# Patient Record
Sex: Female | Born: 1955 | Race: White | Hispanic: No | Smoking: Former smoker
Health system: Southern US, Community
[De-identification: ages and names within clinical notes are randomized; demographics above are authoritative.]

## PROBLEM LIST (undated history)

## (undated) DIAGNOSIS — Z9283 Personal history of failed moderate sedation: Secondary | ICD-10-CM

## (undated) DIAGNOSIS — M549 Dorsalgia, unspecified: Secondary | ICD-10-CM

## (undated) DIAGNOSIS — I10 Essential (primary) hypertension: Secondary | ICD-10-CM

## (undated) DIAGNOSIS — K2270 Barrett's esophagus without dysplasia: Secondary | ICD-10-CM

## (undated) DIAGNOSIS — J449 Chronic obstructive pulmonary disease, unspecified: Secondary | ICD-10-CM

## (undated) DIAGNOSIS — H269 Unspecified cataract: Secondary | ICD-10-CM

## (undated) DIAGNOSIS — E039 Hypothyroidism, unspecified: Secondary | ICD-10-CM

## (undated) DIAGNOSIS — E079 Disorder of thyroid, unspecified: Secondary | ICD-10-CM

## (undated) DIAGNOSIS — K449 Diaphragmatic hernia without obstruction or gangrene: Secondary | ICD-10-CM

## (undated) DIAGNOSIS — T8859XA Other complications of anesthesia, initial encounter: Secondary | ICD-10-CM

## (undated) DIAGNOSIS — B009 Herpesviral infection, unspecified: Secondary | ICD-10-CM

## (undated) DIAGNOSIS — M1990 Unspecified osteoarthritis, unspecified site: Secondary | ICD-10-CM

## (undated) DIAGNOSIS — F419 Anxiety disorder, unspecified: Secondary | ICD-10-CM

## (undated) DIAGNOSIS — D259 Leiomyoma of uterus, unspecified: Secondary | ICD-10-CM

## (undated) DIAGNOSIS — G4730 Sleep apnea, unspecified: Secondary | ICD-10-CM

## (undated) DIAGNOSIS — K219 Gastro-esophageal reflux disease without esophagitis: Secondary | ICD-10-CM

## (undated) DIAGNOSIS — G8929 Other chronic pain: Secondary | ICD-10-CM

## (undated) DIAGNOSIS — R000 Tachycardia, unspecified: Secondary | ICD-10-CM

## (undated) DIAGNOSIS — Z5189 Encounter for other specified aftercare: Secondary | ICD-10-CM

## (undated) DIAGNOSIS — E119 Type 2 diabetes mellitus without complications: Secondary | ICD-10-CM

## (undated) DIAGNOSIS — K269 Duodenal ulcer, unspecified as acute or chronic, without hemorrhage or perforation: Secondary | ICD-10-CM

## (undated) DIAGNOSIS — E785 Hyperlipidemia, unspecified: Secondary | ICD-10-CM

## (undated) DIAGNOSIS — J439 Emphysema, unspecified: Secondary | ICD-10-CM

## (undated) DIAGNOSIS — D229 Melanocytic nevi, unspecified: Secondary | ICD-10-CM

## (undated) DIAGNOSIS — Z8673 Personal history of transient ischemic attack (TIA), and cerebral infarction without residual deficits: Secondary | ICD-10-CM

## (undated) DIAGNOSIS — Z8639 Personal history of other endocrine, nutritional and metabolic disease: Secondary | ICD-10-CM

## (undated) DIAGNOSIS — Z720 Tobacco use: Secondary | ICD-10-CM

## (undated) DIAGNOSIS — K648 Other hemorrhoids: Secondary | ICD-10-CM

## (undated) DIAGNOSIS — N939 Abnormal uterine and vaginal bleeding, unspecified: Secondary | ICD-10-CM

## (undated) DIAGNOSIS — N840 Polyp of corpus uteri: Secondary | ICD-10-CM

## (undated) HISTORY — DX: Diaphragmatic hernia without obstruction or gangrene: K44.9

## (undated) HISTORY — PX: TONSILLECTOMY: SUR1361

## (undated) HISTORY — DX: Personal history of other endocrine, nutritional and metabolic disease: Z86.39

## (undated) HISTORY — DX: Barrett's esophagus without dysplasia: K22.70

## (undated) HISTORY — DX: Hypothyroidism, unspecified: E03.9

## (undated) HISTORY — DX: Unspecified cataract: H26.9

## (undated) HISTORY — PX: WISDOM TOOTH EXTRACTION: SHX21

## (undated) HISTORY — DX: Polyp of corpus uteri: N84.0

## (undated) HISTORY — DX: Encounter for other specified aftercare: Z51.89

## (undated) HISTORY — DX: Melanocytic nevi, unspecified: D22.9

## (undated) HISTORY — DX: Personal history of transient ischemic attack (TIA), and cerebral infarction without residual deficits: Z86.73

## (undated) HISTORY — DX: Essential (primary) hypertension: I10

## (undated) HISTORY — DX: Disorder of thyroid, unspecified: E07.9

## (undated) HISTORY — DX: Chronic obstructive pulmonary disease, unspecified: J44.9

## (undated) HISTORY — DX: Gastro-esophageal reflux disease without esophagitis: K21.9

## (undated) HISTORY — DX: Hyperlipidemia, unspecified: E78.5

## (undated) HISTORY — PX: CHOLECYSTECTOMY: SHX55

## (undated) HISTORY — DX: Other hemorrhoids: K64.8

## (undated) HISTORY — DX: Unspecified osteoarthritis, unspecified site: M19.90

## (undated) HISTORY — PX: MYOMECTOMY: SHX85

## (undated) HISTORY — DX: Duodenal ulcer, unspecified as acute or chronic, without hemorrhage or perforation: K26.9

## (undated) HISTORY — DX: Tachycardia, unspecified: R00.0

## (undated) HISTORY — DX: Personal history of failed moderate sedation: Z92.83

## (undated) HISTORY — DX: Other chronic pain: G89.29

## (undated) HISTORY — DX: Leiomyoma of uterus, unspecified: D25.9

## (undated) HISTORY — PX: HYSTEROSCOPY: SHX211

## (undated) HISTORY — PX: ADENOIDECTOMY: SHX5191

## (undated) HISTORY — DX: Dorsalgia, unspecified: M54.9

## (undated) HISTORY — DX: Abnormal uterine and vaginal bleeding, unspecified: N93.9

## (undated) HISTORY — DX: Tobacco use: Z72.0

## (undated) HISTORY — DX: Emphysema, unspecified: J43.9

## (undated) HISTORY — PX: DILATION AND CURETTAGE OF UTERUS: SHX78

---

## 2000-01-24 ENCOUNTER — Emergency Department (HOSPITAL_COMMUNITY): Admission: EM | Admit: 2000-01-24 | Discharge: 2000-01-24 | Payer: Self-pay

## 2000-03-03 ENCOUNTER — Ambulatory Visit (HOSPITAL_COMMUNITY): Admission: RE | Admit: 2000-03-03 | Discharge: 2000-03-03 | Payer: Self-pay

## 2000-04-06 ENCOUNTER — Ambulatory Visit (HOSPITAL_COMMUNITY): Admission: RE | Admit: 2000-04-06 | Discharge: 2000-04-06 | Payer: Self-pay | Admitting: Family Medicine

## 2000-04-06 ENCOUNTER — Encounter: Payer: Self-pay | Admitting: Family Medicine

## 2000-10-12 ENCOUNTER — Emergency Department (HOSPITAL_COMMUNITY): Admission: EM | Admit: 2000-10-12 | Discharge: 2000-10-13 | Payer: Self-pay | Admitting: Emergency Medicine

## 2000-10-12 DIAGNOSIS — I639 Cerebral infarction, unspecified: Secondary | ICD-10-CM

## 2000-10-12 DIAGNOSIS — Z8673 Personal history of transient ischemic attack (TIA), and cerebral infarction without residual deficits: Secondary | ICD-10-CM

## 2000-10-12 HISTORY — DX: Personal history of transient ischemic attack (TIA), and cerebral infarction without residual deficits: Z86.73

## 2000-10-12 HISTORY — DX: Cerebral infarction, unspecified: I63.9

## 2000-10-13 ENCOUNTER — Encounter: Payer: Self-pay | Admitting: Emergency Medicine

## 2000-10-25 ENCOUNTER — Encounter: Payer: Self-pay | Admitting: Family Medicine

## 2000-10-25 ENCOUNTER — Ambulatory Visit (HOSPITAL_COMMUNITY): Admission: RE | Admit: 2000-10-25 | Discharge: 2000-10-25 | Payer: Self-pay | Admitting: Family Medicine

## 2001-09-21 ENCOUNTER — Ambulatory Visit (HOSPITAL_COMMUNITY): Admission: RE | Admit: 2001-09-21 | Discharge: 2001-09-21 | Payer: Self-pay | Admitting: Internal Medicine

## 2001-09-21 ENCOUNTER — Encounter: Payer: Self-pay | Admitting: Internal Medicine

## 2002-11-07 ENCOUNTER — Other Ambulatory Visit: Admission: RE | Admit: 2002-11-07 | Discharge: 2002-11-07 | Payer: Self-pay | Admitting: Obstetrics and Gynecology

## 2002-12-09 ENCOUNTER — Encounter: Payer: Self-pay | Admitting: Obstetrics and Gynecology

## 2002-12-09 ENCOUNTER — Ambulatory Visit (HOSPITAL_COMMUNITY): Admission: RE | Admit: 2002-12-09 | Discharge: 2002-12-09 | Payer: Self-pay | Admitting: Obstetrics and Gynecology

## 2003-03-15 DIAGNOSIS — N840 Polyp of corpus uteri: Secondary | ICD-10-CM

## 2003-03-15 HISTORY — DX: Polyp of corpus uteri: N84.0

## 2003-03-17 ENCOUNTER — Encounter (INDEPENDENT_AMBULATORY_CARE_PROVIDER_SITE_OTHER): Payer: Self-pay

## 2003-03-17 ENCOUNTER — Ambulatory Visit (HOSPITAL_COMMUNITY): Admission: RE | Admit: 2003-03-17 | Discharge: 2003-03-17 | Payer: Self-pay | Admitting: Obstetrics and Gynecology

## 2004-02-16 ENCOUNTER — Ambulatory Visit: Payer: Self-pay | Admitting: Internal Medicine

## 2004-02-28 ENCOUNTER — Ambulatory Visit (HOSPITAL_COMMUNITY): Admission: RE | Admit: 2004-02-28 | Discharge: 2004-02-28 | Payer: Self-pay | Admitting: *Deleted

## 2004-03-22 ENCOUNTER — Ambulatory Visit: Payer: Self-pay

## 2004-03-22 ENCOUNTER — Encounter: Payer: Self-pay | Admitting: Internal Medicine

## 2004-04-16 ENCOUNTER — Ambulatory Visit: Payer: Self-pay | Admitting: Internal Medicine

## 2005-05-20 ENCOUNTER — Ambulatory Visit: Payer: Self-pay | Admitting: Internal Medicine

## 2005-05-28 ENCOUNTER — Encounter (INDEPENDENT_AMBULATORY_CARE_PROVIDER_SITE_OTHER): Payer: Self-pay | Admitting: *Deleted

## 2005-05-28 ENCOUNTER — Ambulatory Visit: Payer: Self-pay | Admitting: Internal Medicine

## 2005-05-28 DIAGNOSIS — K22719 Barrett's esophagus with dysplasia, unspecified: Secondary | ICD-10-CM

## 2005-06-25 ENCOUNTER — Ambulatory Visit: Payer: Self-pay | Admitting: Internal Medicine

## 2006-06-25 ENCOUNTER — Encounter: Admission: RE | Admit: 2006-06-25 | Discharge: 2006-06-25 | Payer: Self-pay | Admitting: Family Medicine

## 2006-10-08 ENCOUNTER — Encounter: Payer: Self-pay | Admitting: Internal Medicine

## 2007-04-29 ENCOUNTER — Encounter: Admission: RE | Admit: 2007-04-29 | Discharge: 2007-04-29 | Payer: Self-pay | Admitting: Family Medicine

## 2009-01-10 ENCOUNTER — Encounter: Payer: Self-pay | Admitting: Internal Medicine

## 2009-01-10 ENCOUNTER — Ambulatory Visit: Payer: Self-pay | Admitting: Infectious Diseases

## 2009-01-10 DIAGNOSIS — E039 Hypothyroidism, unspecified: Secondary | ICD-10-CM | POA: Insufficient documentation

## 2009-01-10 DIAGNOSIS — K219 Gastro-esophageal reflux disease without esophagitis: Secondary | ICD-10-CM

## 2009-01-10 DIAGNOSIS — I10 Essential (primary) hypertension: Secondary | ICD-10-CM | POA: Insufficient documentation

## 2009-01-10 DIAGNOSIS — J45909 Unspecified asthma, uncomplicated: Secondary | ICD-10-CM

## 2009-01-11 LAB — CONVERTED CEMR LAB
AST: 38 units/L — ABNORMAL HIGH (ref 0–37)
Albumin: 4.2 g/dL (ref 3.5–5.2)
Basophils Absolute: 0.1 10*3/uL (ref 0.0–0.1)
Chloride: 100 meq/L (ref 96–112)
Free T4: 0.3 ng/dL — ABNORMAL LOW (ref 0.80–1.80)
HCT: 38.7 % (ref 36.0–46.0)
Hemoglobin: 11.9 g/dL — ABNORMAL LOW (ref 12.0–15.0)
Lymphocytes Relative: 30 % (ref 12–46)
Lymphs Abs: 2.3 10*3/uL (ref 0.7–4.0)
MCV: 91.5 fL (ref >=78.0)
Monocytes Absolute: 0.6 10*3/uL (ref 0.1–1.0)
Monocytes Relative: 8 % (ref 3–12)
Neutro Abs: 4.7 10*3/uL (ref 1.7–7.7)
Neutrophils Relative %: 60 % (ref 43–77)
Platelets: 321 10*3/uL (ref 150–400)
Potassium: 4 meq/L (ref 3.5–5.3)
RBC: 4.23 M/uL (ref 3.87–5.11)
Sodium: 139 meq/L (ref 135–145)
Total Bilirubin: 0.3 mg/dL (ref 0.3–1.2)
Total Protein: 7.8 g/dL (ref 6.0–8.3)
WBC: 7.9 10*3/uL (ref 4.0–10.5)

## 2009-02-06 ENCOUNTER — Ambulatory Visit: Payer: Self-pay | Admitting: Internal Medicine

## 2009-02-06 LAB — CONVERTED CEMR LAB
LDL Cholesterol: 172 mg/dL — ABNORMAL HIGH (ref 0–99)
Total CHOL/HDL Ratio: 6.2
VLDL: 27 mg/dL (ref 0–40)

## 2009-02-12 DIAGNOSIS — E785 Hyperlipidemia, unspecified: Secondary | ICD-10-CM | POA: Insufficient documentation

## 2009-02-12 DIAGNOSIS — E1169 Type 2 diabetes mellitus with other specified complication: Secondary | ICD-10-CM | POA: Insufficient documentation

## 2009-03-26 ENCOUNTER — Encounter: Payer: Self-pay | Admitting: Internal Medicine

## 2009-03-26 ENCOUNTER — Ambulatory Visit (HOSPITAL_COMMUNITY): Admission: RE | Admit: 2009-03-26 | Discharge: 2009-03-26 | Payer: Self-pay | Admitting: Internal Medicine

## 2009-03-26 ENCOUNTER — Ambulatory Visit: Payer: Self-pay | Admitting: Internal Medicine

## 2009-03-26 DIAGNOSIS — R000 Tachycardia, unspecified: Secondary | ICD-10-CM | POA: Insufficient documentation

## 2009-03-27 ENCOUNTER — Encounter: Payer: Self-pay | Admitting: Internal Medicine

## 2009-03-28 ENCOUNTER — Encounter: Payer: Self-pay | Admitting: Internal Medicine

## 2009-04-16 ENCOUNTER — Ambulatory Visit (HOSPITAL_COMMUNITY): Admission: RE | Admit: 2009-04-16 | Discharge: 2009-04-16 | Payer: Self-pay | Admitting: Internal Medicine

## 2009-05-03 ENCOUNTER — Ambulatory Visit: Payer: Self-pay | Admitting: Internal Medicine

## 2009-06-19 ENCOUNTER — Ambulatory Visit: Payer: Self-pay | Admitting: Internal Medicine

## 2009-06-26 ENCOUNTER — Ambulatory Visit: Payer: Self-pay | Admitting: Infectious Diseases

## 2009-06-26 ENCOUNTER — Telehealth: Payer: Self-pay | Admitting: Internal Medicine

## 2009-06-27 ENCOUNTER — Telehealth: Payer: Self-pay | Admitting: Internal Medicine

## 2009-07-09 LAB — CONVERTED CEMR LAB
AST: 21 units/L (ref 0–37)
BUN: 16 mg/dL (ref 6–23)
Calcium: 9.5 mg/dL (ref 8.4–10.5)
Creatinine, Ser: 0.82 mg/dL (ref 0.40–1.20)
Glucose, Bld: 94 mg/dL (ref 70–99)
Sodium: 139 meq/L (ref 135–145)
TSH: 82.659 microintl units/mL — ABNORMAL HIGH (ref 0.350–4.5)
Total Bilirubin: 0.3 mg/dL (ref 0.3–1.2)
Total Protein: 7.3 g/dL (ref 6.0–8.3)

## 2009-08-13 ENCOUNTER — Ambulatory Visit: Payer: Self-pay | Admitting: Internal Medicine

## 2009-08-13 DIAGNOSIS — D239 Other benign neoplasm of skin, unspecified: Secondary | ICD-10-CM | POA: Insufficient documentation

## 2009-10-09 ENCOUNTER — Telehealth: Payer: Self-pay | Admitting: Internal Medicine

## 2009-10-19 ENCOUNTER — Encounter (INDEPENDENT_AMBULATORY_CARE_PROVIDER_SITE_OTHER): Payer: Self-pay | Admitting: *Deleted

## 2009-10-31 ENCOUNTER — Telehealth: Payer: Self-pay | Admitting: Internal Medicine

## 2009-11-09 ENCOUNTER — Encounter: Payer: Self-pay | Admitting: Internal Medicine

## 2009-12-11 ENCOUNTER — Telehealth: Payer: Self-pay | Admitting: Internal Medicine

## 2009-12-11 ENCOUNTER — Encounter (INDEPENDENT_AMBULATORY_CARE_PROVIDER_SITE_OTHER): Payer: Self-pay | Admitting: *Deleted

## 2009-12-11 ENCOUNTER — Ambulatory Visit: Payer: Self-pay | Admitting: Internal Medicine

## 2009-12-11 DIAGNOSIS — K625 Hemorrhage of anus and rectum: Secondary | ICD-10-CM

## 2009-12-12 ENCOUNTER — Encounter: Payer: Self-pay | Admitting: Internal Medicine

## 2009-12-18 ENCOUNTER — Ambulatory Visit: Payer: Self-pay | Admitting: Internal Medicine

## 2009-12-18 ENCOUNTER — Ambulatory Visit (HOSPITAL_COMMUNITY): Admission: RE | Admit: 2009-12-18 | Discharge: 2009-12-18 | Payer: Self-pay | Admitting: Internal Medicine

## 2009-12-18 HISTORY — PX: UPPER GASTROINTESTINAL ENDOSCOPY: SHX188

## 2010-01-25 ENCOUNTER — Ambulatory Visit: Payer: Self-pay | Admitting: Internal Medicine

## 2010-01-25 LAB — CONVERTED CEMR LAB
Basophils Absolute: 0 10*3/uL (ref 0.0–0.1)
Basophils Relative: 0.4 % (ref 0.0–3.0)
Eosinophils Absolute: 0.2 10*3/uL (ref 0.0–0.7)
Eosinophils Relative: 2.6 % (ref 0.0–5.0)
Lymphocytes Relative: 30.7 % (ref 12.0–46.0)
MCHC: 33.4 g/dL (ref 30.0–36.0)
Monocytes Absolute: 0.6 10*3/uL (ref 0.1–1.0)
Monocytes Relative: 7.4 % (ref 3.0–12.0)
Neutro Abs: 4.7 10*3/uL (ref 1.4–7.7)
Platelets: 293 10*3/uL (ref 150.0–400.0)

## 2010-01-28 ENCOUNTER — Telehealth: Payer: Self-pay | Admitting: Internal Medicine

## 2010-01-29 ENCOUNTER — Telehealth: Payer: Self-pay | Admitting: *Deleted

## 2010-01-29 ENCOUNTER — Ambulatory Visit: Payer: Self-pay | Admitting: Internal Medicine

## 2010-01-29 ENCOUNTER — Encounter (INDEPENDENT_AMBULATORY_CARE_PROVIDER_SITE_OTHER): Payer: Self-pay | Admitting: *Deleted

## 2010-01-29 DIAGNOSIS — G43009 Migraine without aura, not intractable, without status migrainosus: Secondary | ICD-10-CM

## 2010-01-29 LAB — CONVERTED CEMR LAB
Free T4: 1.43 ng/dL (ref 0.80–1.80)
TSH: 4.122 microintl units/mL (ref 0.350–4.5)
Total CHOL/HDL Ratio: 3.5
Triglycerides: 122 mg/dL (ref <150)
VLDL: 24 mg/dL (ref 0–40)

## 2010-02-07 ENCOUNTER — Encounter: Payer: Self-pay | Admitting: Internal Medicine

## 2010-02-07 HISTORY — PX: COLONOSCOPY: SHX174

## 2010-03-26 ENCOUNTER — Telehealth: Payer: Self-pay | Admitting: Internal Medicine

## 2010-04-19 ENCOUNTER — Telehealth: Payer: Self-pay | Admitting: *Deleted

## 2010-04-19 ENCOUNTER — Telehealth: Payer: Self-pay | Admitting: Internal Medicine

## 2010-04-20 ENCOUNTER — Emergency Department (HOSPITAL_COMMUNITY)
Admission: EM | Admit: 2010-04-20 | Discharge: 2010-04-20 | Payer: Self-pay | Source: Home / Self Care | Admitting: Family Medicine

## 2010-05-01 ENCOUNTER — Telehealth: Payer: Self-pay | Admitting: Internal Medicine

## 2010-05-02 ENCOUNTER — Ambulatory Visit: Admit: 2010-05-02 | Payer: Self-pay | Admitting: Internal Medicine

## 2010-05-14 NOTE — Consult Note (Signed)
Summary: [facility]  [facility]   Imported By: Louretta Parma 11/20/2009 15:12:59  _____________________________________________________________________  External Attachment:    Type:   Image     Comment:   External Document

## 2010-05-14 NOTE — Procedures (Signed)
Summary: Instruction for procedure/[place]  Instruction for procedure/[facility]   Imported By: Sherian Rein 12/14/2009 15:10:35  _____________________________________________________________________  External Attachment:    Type:   Image     Comment:   External Document

## 2010-05-14 NOTE — Letter (Signed)
Summary: EGD Instructions  [facility]  [address] [place], [place] [address]   Phone: [phone]  Fax: [phone]       Grace [name]    Aug 13, 1955    MRN: [id number]       Procedure Day Dorna Bloom: Jake Shark, 12/18/09     Arrival Time: 9:00 AM     Procedure Time: 10:00 AM     Location of Procedure:                    _X_ [facility] ( Outpatient Registration)   PREPARATION FOR ENDOSCOPY   On TUESDAY, 12/18/09, THE DAY OF THE PROCEDURE:  1.   No solid foods, milk or milk products are allowed after midnight the night before your procedure.  2.   Do not drink anything colored red or purple.  Avoid juices with pulp.  No orange juice.  3.  You may drink clear liquids until 6:00 AM, which is 4 hours before your procedure.                                                                                                CLEAR LIQUIDS INCLUDE: Water Jello Ice Popsicles Tea (sugar ok, no milk/cream) Powdered fruit flavored drinks Coffee (sugar ok, no milk/cream) Gatorade Juice: apple, white grape, white cranberry  Lemonade Clear bullion, consomm, broth Carbonated beverages (any kind) Strained chicken noodle soup Hard Candy   MEDICATION INSTRUCTIONS  Unless otherwise instructed, you should take regular prescription medications with a small sip of water as early as possible the morning of your procedure.                OTHER INSTRUCTIONS  You will need a responsible adult at least 55 years of age to accompany you and drive you home.   This person must remain in the waiting room during your procedure.  Wear loose fitting clothing that is easily removed.  Leave jewelry and other valuables at home.  However, you may wish to bring a book to read or an iPod/MP3 player to listen to music as you wait for your procedure to start.  Remove all body piercing jewelry and leave at home.  Total time from sign-in until discharge is approximately 2-3  hours.  You should go home directly after your procedure and rest.  You can resume normal activities the day after your procedure.  The day of your procedure you should not:   Drive   Make legal decisions   Operate machinery   Drink alcohol   Return to work  You will receive specific instructions about eating, activities and medications before you leave.    The above instructions have been reviewed and explained to me by   _______________________    I fully understand and can verbalize these instructions _____________________________ Date _________

## 2010-05-14 NOTE — Progress Notes (Signed)
Summary: Refill/gh  Phone Note Refill Request Message from:  Fax from Pharmacy on October 09, 2009 12:09 PM  Refills Requested: Medication #1:  OMEPRAZOLE 40 MG CPDR Take 1 tablet twice a day.   Last Refilled: 06/14/2009  Method Requested: Electronic Initial call taken by: Angelina Ok RN,  October 09, 2009 12:10 PM  Follow-up for Phone Call       Follow-up by: Blondell Reveal MD,  October 10, 2009 1:12 PM    Prescriptions: OMEPRAZOLE 40 MG CPDR (OMEPRAZOLE) Take 1 tablet twice a day.  #60 x 11   Entered and Authorized by:   Blondell Reveal MD   Signed by:   Blondell Reveal MD on 10/10/2009   Method used:   Faxed to ...       [facility] Department (retail)       [address] [place], [place]  [address]       Ph: [phone]       Fax: [phone]   RxID:   [id number]

## 2010-05-14 NOTE — Assessment & Plan Note (Signed)
Summary: f/u lipids/pcp-Wesleigh Markovic/hla   Vital Signs:  Patient profile:   55 year old female Height:      63.9 inches (162.31 cm) Weight:      197.0 pounds (90.09 kg) BMI:     34.25 Temp:     97.0 degrees F (36.11 degrees C) oral Pulse rate:   93 / minute BP sitting:   149 / 97  (right arm) Cuff size:   regular  Vitals Entered By: Theotis Barrio NT II (Aug 13, 2009 9:20 AM) CC: ? HERE FOR LABS/   REQUEST REMOVAL OF MOLE LEFT BREAST /  CHRONIC BACK PAIN Is Patient Diabetic? No Pain Assessment Patient in pain? yes     Location: back Intensity:       7 Type: ACHE Onset of pain  Chronic Nutritional Status BMI of > 30 = obese  Have you ever been in a relationship where you felt threatened, hurt or afraid?No   Does patient need assistance? Functional Status Self care Ambulation Normal Comments CHRONIC BACK PAIN / ? HERE FOR FOR LABS   /  REQUEST REMOVAL OF MOLE ON LEFT BREAST.   Primary Care Provider:  Blondell Reveal MD  CC:  ? HERE FOR LABS/   REQUEST REMOVAL OF MOLE LEFT BREAST /  CHRONIC BACK PAIN.  History of Present Illness: 55 year old with PMH as mentioned in the EMR comes to the office for the following.  1. Patient reports that her mole on the left breast, which has been present for the entire life, is changing in size. Patient denies any changes in the color, pain, or other systemic symptoms. Patient is requesting to see a dermatologist.  2. Reports compliance to all her medicines including her synthroid. 3. Discussed all her labs from march. Patients LDL was in 150's and was started on pravastatin and denies any problems with it.  She denies any other complaints.  Preventive Screening-Counseling & Management  Alcohol-Tobacco     Alcohol drinks/day: rarely     Smoking Status: current     Smoking Cessation Counseling: yes     Packs/Day: 3-4 cigs/day  Caffeine-Diet-Exercise     Does Patient Exercise: no  Problems Prior to Update: 1)  Epigastric Pain   (ICD-789.06) 2)  Unspecified Tachycardia  (ICD-785.0) 3)  Hyperlipidemia  (ICD-272.4) 4)  COPD  (ICD-496) 5)  Tobacco Abuse  (ICD-305.1) 6)  Essential Hypertension  (ICD-401.9) 7)  Hx of Transient Ischemic Attack  (ICD-435.9) 8)  Asthma  (ICD-493.90) 9)  Gerd  (ICD-530.81) 10)  Hypothyroidism  (ICD-244.9) 11)  Hiatal Hernia  (ICD-553.3) 12)  Reflux Esophagitis  (ICD-530.11) 13)  Barretts Esophagus  (ICD-530.85)  Medications Prior to Update: 1)  Synthroid 150 Mcg Tabs (Levothyroxine Sodium) .... Take 1 Tablet By Mouth Daily. 2)  Lisinopril-Hydrochlorothiazide 10-12.5 Mg Tabs (Lisinopril-Hydrochlorothiazide) .... Take 1 Tablet By Mouth Once A Day 3)  Combivent 103-18 Mcg/act Aero (Ipratropium-Albuterol) .... 2 Puffs Three Times Daily 4)  Omeprazole 40 Mg Cpdr (Omeprazole) .... Take 1 Tablet Twice A Day. 5)  Aspir-Low 81 Mg Tbec (Aspirin) .... Take 1 Tablet By Mouth Once A Day 6)  Pravastatin Sodium 40 Mg Tabs (Pravastatin Sodium) .... Take 1 Tablet By Mouth Once A Day  Current Medications (verified): 1)  Synthroid 150 Mcg Tabs (Levothyroxine Sodium) .... Take 1 Tablet By Mouth Daily. 2)  Lisinopril-Hydrochlorothiazide 10-12.5 Mg Tabs (Lisinopril-Hydrochlorothiazide) .... Take 1 Tablet By Mouth Once A Day 3)  Combivent 103-18 Mcg/act Aero (Ipratropium-Albuterol) .... 2 Puffs Three Times  Daily 4)  Omeprazole 40 Mg Cpdr (Omeprazole) .... Take 1 Tablet Twice A Day. 5)  Aspir-Low 81 Mg Tbec (Aspirin) .... Take 1 Tablet By Mouth Once A Day 6)  Pravastatin Sodium 40 Mg Tabs (Pravastatin Sodium) .... Take 1 Tablet By Mouth Once A Day  Allergies (verified): No Known Drug Allergies  Past History:  Family History: Last updated: 01/10/2009 Mom - 14 yr old, COPD very active Dad - Died at 52 yr old, COPD, 5 heart attacks, first attack in 15's. 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 married couple of times, divorced, no kids uses her mom social security smokes half a pack a day , cut down from 1ppd drinks alcohol occasionally  Risk Factors: Alcohol Use: rarely (08/13/2009) Exercise: no (08/13/2009)  Risk Factors: Smoking Status: current (08/13/2009) Packs/Day: 3-4 cigs/day (08/13/2009)  Past Medical History: Reviewed history from 01/10/2009 and no changes required. Hypothyroidism HTN Asthma COPD History of TIA in 2002 GERD TObacco abuse HIATAL HERNIA (ICD-553.3) REFLUX ESOPHAGITIS (ICD-530.11) BARRETTS ESOPHAGUS (ICD-530.85)  Past Surgical History: Reviewed history from 01/20/2008 and no changes required. Gallbladder surgery 1996  Family History: Reviewed history from 01/10/2009 and no changes required. Mom - 15 yr old, COPD very active Dad - Died at 45 yr old, COPD, 5 heart attacks, first attack in 31's. Only daughter  Social History: Reviewed history from 01/10/2009 and no changes required. 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 married couple of times, divorced, no kids uses her mom social security smokes half a pack a day , cut down from 1ppd drinks alcohol occasionally  Review of Systems      See HPI  Physical Exam  General:  alert and well-developed.   Head:  normocephalic and atraumatic.   Mouth:  Oral mucosa and oropharynx without lesions or exudates.  Teeth in good repair. Neck:  supple, no thyromegaly Lungs:  normal respiratory effort and normal breath sounds.   Heart:  normal rate and regular rhythm.   Abdomen:  soft and non-tender.   Msk:  No deformity or scoliosis noted of thoracic or lumbar spine.   Pulses:  R radial normal.   Extremities:  no cyanosis, clubbing or edema  Neurologic:  alert & oriented X3, strength normal in all extremities, and gait normal.   Skin:  small 0.5 cm diameter mold noted on the lateral aspect of her left breast. Pale in color, non-tender,  with no excoriations, or blood vessels. No pigmentations seen over the mole. Cervical Nodes:  No lymphadenopathy noted Psych:  Oriented X3 and memory intact for recent and remote.     Impression & Recommendations:  Problem # 1:  HYPERLIPIDEMIA (ICD-272.4)  Recently started on statins. Reports compliancy and denies any problems with statins.  Her updated medication list for this problem includes:    Pravastatin Sodium 40 Mg Tabs (Pravastatin sodium) .Marland Kitchen... Take 1 tablet by mouth once a day  Orders: T-Hgb A1C (in-house) (16109UE)  Problem # 2:  TOBACCO ABUSE (ICD-305.1)  Reports cutting down on smoking. Encouraged her to stop completely.  Orders: T-Hgb A1C (in-house) ([id number])  Problem # 3:  ESSENTIAL HYPERTENSION (ICD-401.9)  BP slightly elevated but patient reports her home BP usually run in 120'srange. Will not make any changes in her medicines.  Her updated medication list for this problem includes:    Lisinopril-hydrochlorothiazide 10-12.5  Mg Tabs (Lisinopril-hydrochlorothiazide) .Marland Kitchen... Take 1 tablet by mouth once a day  BP today: 149/97 Prior BP: 131/82 (06/19/2009)  Labs Reviewed: K+: 4.5 (06/26/2009) Creat: : 0.82 (06/26/2009)   Chol: 234 (06/26/2009)   HDL: 47 (06/26/2009)   LDL: 158 (06/26/2009)   TG: 147 (06/26/2009)  Orders: T-Hgb A1C (in-house) ([id number])  Problem # 4:  NEVUS (ICD-216.9)  Mole on her left lateral aspect of left breast. Patient reports recent increase in size although she denies any changes in color. She denies any pain, itching. Given her concerns, will refer to a dermatologist.   Orders: Dermatology Referral (Derma)  Problem # 5:  HYPOTHYROIDISM (ICD-244.9)  TSH in march was very high. Will increase her synthroid to 175 micrograms and check a repeat TSH in 2 months.  The following medications were removed from the medication list:    Synthroid 150 Mcg Tabs (Levothyroxine sodium) .Marland Kitchen... Take 1 tablet by mouth daily. Her updated medication  list for this problem includes:    Synthroid 175 Mcg Tabs (Levothyroxine sodium) .Marland Kitchen... Take 1 tablet by mouth once a day  Labs Reviewed: TSH: 82.659 (06/26/2009)    Chol: 234 (06/26/2009)   HDL: 47 (06/26/2009)   LDL: 158 (06/26/2009)   TG: 147 (06/26/2009)  Orders: T-Hgb A1C (in-house) ([id number])  Complete Medication List: 1)  Lisinopril-hydrochlorothiazide 10-12.5 Mg Tabs (Lisinopril-hydrochlorothiazide) .... Take 1 tablet by mouth once a day 2)  Combivent 103-18 Mcg/act Aero (Ipratropium-albuterol) .... 2 puffs three times daily 3)  Omeprazole 40 Mg Cpdr (Omeprazole) .... Take 1 tablet twice a day. 4)  Aspir-low 81 Mg Tbec (Aspirin) .... Take 1 tablet by mouth once a day 5)  Pravastatin Sodium 40 Mg Tabs (Pravastatin sodium) .... Take 1 tablet by mouth once a day 6)  Synthroid 175 Mcg Tabs (Levothyroxine sodium) .... Take 1 tablet by mouth once a day  Patient Instructions: 1)  Please schedule a follow-up appointment in 2 months. 2)  Take the synthroid 175 micrograms once daily. 3)  Take all the medicines as advised below. 4)  Tobacco is very bad for your health and your loved ones! You Should stop smoking!. 5)  Stop Smoking Tips: Choose a Quit date. Cut down before the Quit date. decide what you will do as a substitute when you feel the urge to smoke(gum,toothpick,exercise). Prescriptions: SYNTHROID 175 MCG TABS (LEVOTHYROXINE SODIUM) Take 1 tablet by mouth once a day  #30 x 3   Entered and Authorized by:   Blondell Reveal MD   Signed by:   Blondell Reveal MD on 08/13/2009   Method used:   Faxed to ...       [facility] (retail)       [address] [place], [place]  [address]       Ph: [phone]       Fax: [phone]   RxID:   [id number]    Prevention & Chronic Care Immunizations   Influenza vaccine: Fluvax Non-MCR  (02/06/2009)   Influenza vaccine deferral: Not indicated  (03/26/2009)    Tetanus booster: Not documented   Td booster  deferral: Refused  (08/13/2009)    Pneumococcal vaccine: Not documented  Colorectal Screening   Hemoccult: Not documented   Hemoccult action/deferral: Refused  (05/03/2009)    Colonoscopy: Not documented   Colonoscopy action/deferral: Not indicated  (03/26/2009)  Other Screening   Pap smear: Not documented   Pap smear action/deferral: Deferred  (03/26/2009)    Mammogram: ASSESSMENT: Negative -  BI-RADS 1^MM DIGITAL SCREENING  (04/16/2009)   Mammogram action/deferral: Ordered  (03/26/2009)   Smoking status: current  (08/13/2009)   Smoking cessation counseling: yes  (08/13/2009)  Lipids   Total Cholesterol: 234  (06/26/2009)   LDL: 158  (06/26/2009)   LDL Direct: Not documented   HDL: 47  (06/26/2009)   Triglycerides: 147  (06/26/2009)    SGOT (AST): 21  (06/26/2009)   SGPT (ALT): 22  (06/26/2009)   Alkaline phosphatase: 64  (06/26/2009)   Total bilirubin: 0.3  (06/26/2009)  Hypertension   Last Blood Pressure: 149 / 97  (08/13/2009)   Serum creatinine: 0.82  (06/26/2009)   Serum potassium 4.5  (06/26/2009)  Self-Management Support :   Personal Goals (by the next clinic visit) :      Personal blood pressure goal: 140/90  (08/13/2009)   Patient will work on the following items until the next clinic visit to reach self-care goals:     Medications and monitoring: take my medicines every day, check my blood pressure, bring all of my medications to every visit  (08/13/2009)     Eating: drink diet soda or water instead of juice or soda, eat more vegetables, use fresh or frozen vegetables, eat foods that are low in salt, eat baked foods instead of fried foods, eat fruit for snacks and desserts, limit or avoid alcohol  (08/13/2009)     Activity: park at the far end of the parking lot  (08/13/2009)    Hypertension self-management support: Resources for patients handout  (08/13/2009)    Lipid self-management support: Resources for patients handout  (08/13/2009)      Self-management comments: NOT MUCH EXERCISE DUE TO BACK PAIN      Resource handout printed.   Appended Document: Lab Order/a1c results    Lab Visit  Laboratory Results   Blood Tests   Date/Time Received: Aug 13, 2009 10:24 AM Date/Time Reported: Alric Quan  Aug 13, 2009 10:24 AM   HGBA1C: 6.0%   (Normal Range: Non-Diabetic - 3-6%   Control Diabetic - 6-8%)    Orders Today:

## 2010-05-14 NOTE — Assessment & Plan Note (Signed)
Summary: FU/SB.   Vital Signs:  Patient profile:   55 year old female Height:      63.9 inches Weight:      196.1 pounds BMI:     33.89 Temp:     98.8 degrees F oral Pulse rate:   105 / minute BP sitting:   133 / 88  (right arm)  Vitals Entered By: Filomena Jungling NT II (January 29, 2010 9:55 AM) CC: STOMACH HURT, NAUSEA,  AND HURTS IN CHEST,HEADACHES, TOOTH HURT, SINUS, COUGH,COUGH UP YELLOWSTUFF Pain Assessment Patient in pain? yes     Location: head Nutritional Status BMI of > 30 = obese  Have you ever been in a relationship where you felt threatened, hurt or afraid?No   Does patient need assistance? Functional Status Self care Ambulation Normal   Primary Care Provider:  Blondell Reveal, MD   CC:  STOMACH HURT, NAUSEA, AND HURTS IN CHEST, HEADACHES, TOOTH HURT, SINUS, COUGH, and COUGH UP YELLOWSTUFF.  History of Present Illness: 55 y/o W with h/o goody powder abuse and Barretts esophageus with mild dysplasia, hypothyroidism,HTN comes to the clinic complaing of:  1. sinus problem- patient has been having pain on the b/l cheeks, feeling of congested, has been using tylenol which did not help. this has been going since 2 months.   2. Abd pain, nausea- going on since yrs, comes and goes, complained to Dr.  Leone Payor about it and he felt that this was gastric ulcer pain and adivsed stopping goody pwder and conitnue ppi  3. 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 PAtient conitnues to take goody powed (1-2/day) for these headache.   Preventive Screening-Counseling & Management  Alcohol-Tobacco     Alcohol drinks/day: <1     Smoking Status: current     Smoking Cessation Counseling: yes     Smoke Cessation Stage: precontemplative     Packs/Day: 0.5     Tobacco Counseling: to quit use of tobacco products  Caffeine-Diet-Exercise     Caffeine use/day:  0 in drinks     Does Patient Exercise: no  Current Medications (verified): 1)  Lisinopril-Hydrochlorothiazide 10-12.5 Mg Tabs (Lisinopril-Hydrochlorothiazide) .... Take 1 Tablet By Mouth Once A Day 2)  Combivent 103-18 Mcg/act Aero (Ipratropium-Albuterol) .... 2 Puffs Three Times Daily 3)  Omeprazole 40 Mg Cpdr (Omeprazole) .... Take  Tablet  Twice A Day. 4)  Synthroid 175 Mcg Tabs (Levothyroxine Sodium) .... Take 1 Tablet By Mouth Once A Day 5)  Centrum Silver  Tabs (Multiple Vitamins-Minerals) .... Take One By Mouth Once Daily 6)  Calcium 600-200 Mg-Unit Tabs (Calcium-Vitamin D) .... Take Two By Mouth Once Daily 7)  Rejuvicare .... Take One By Mouth Once Daily 8)  Goodys Extra Strength [phone] Mg Pack (Aspirin-Acetaminophen-Caffeine) .... Take One By Mouth As Needed 9)  Tylenol Allergy Sinus 2-30-500 Mg Tabs (Chlorphen-Pseudoephed-Apap) .... Take One By Mouth As Needed 10)  Probiotic  Caps (Probiotic Product) .... Two Tablets By Mouth Once Daily 11)  Pravastatin Sodium 40 Mg Tabs (Pravastatin Sodium) .... Take One Tablet By Mouth Each Night For Cholesterol  Allergies (verified): 1)  ! Codeine  Review of Systems       The patient complains of headaches.  The patient denies anorexia, fever, weight loss, weight gain, vision loss, decreased hearing, hoarseness, chest pain, syncope, dyspnea on exertion, peripheral edema, prolonged cough,  hemoptysis, abdominal pain, melena, hematochezia, severe indigestion/heartburn, hematuria, incontinence, genital sores, muscle weakness, suspicious skin lesions, transient blindness, difficulty walking, depression, unusual weight change, abnormal bleeding, enlarged lymph nodes, angioedema, and breast masses.    Physical Exam  General:  General: Alert, well developed and in no acurte distress CVC:S1 S2 , no murmurs, no abnormal heart sounds. Lungs: Clear to auscultation B/L. No wheezes, crackles or other abnormal sounds Abdomen: soft, non distended, no  tender. Normal Bowel sounds EXT: no pitting edema, no engorged veins, Pulsations normal  Neuro:alert, oriented *3, cranial nerved 2-12 intact, strenght normal in all  extremities, senstations normal to light touch.   Head:  nosinus tenderness Eyes:  vision grossly intact, pupils equal, pupils round, and pupils reactive to light.   Ears:  R ear normal, L ear normal, and no external deformities.   Nose:  no external deformity, no external erythema, and no nasal discharge.   Mouth:  pharynx pink and moist, poor dentition, and gingival recession.     Impression & Recommendations:  Problem # 1:  HEADACHE, CHRONIC (ICD-784.0) Will rx as migraine as she has been diagnosed with migraine diagnosed in past by her previous pcp Her updated medication list for this problem includes:    Goodys Extra Strength [phone] Mg Pack (Aspirin-acetaminophen-caffeine) .Marland Kitchen... Take one by mouth as needed    Sumatriptan Succinate 25 Mg Tabs (Sumatriptan succinate) .Marland Kitchen... 1 tablets for headache. can repeat after 2 hrs if no improvement  Problem # 2:  MIGRAINE, COMMON (ICD-346.10) will start topamax and sumatriptan low dose.  will call back in 2 weeks for re-evaluation- increase the dose of topamax if no repsonce encourage keeping a headache diary strongly emphasized avoidance of goody powder  Her updated medication list for this problem includes:    Goodys Extra Strength [phone] Mg Pack (Aspirin-acetaminophen-caffeine) .Marland Kitchen... Take one by mouth as needed    Sumatriptan Succinate 25 Mg Tabs (Sumatriptan succinate) .Marland Kitchen... 1 tablets for headache. can repeat after 2 hrs if no improvement  Problem # 3:  REFLUX ESOPHAGITIS (ICD-530.11) current complaints of abd discomfort storngly suggestive of GERD plan conitnue ppi avoid goody powder Rx migraine to keep her off goody powder  Her updated medication list for this problem includes:    Omeprazole 40 Mg Cpdr (Omeprazole) .Marland Kitchen... Take  tablet  twice a day.  Problem #  4:  ESSENTIAL HYPERTENSION (ICD-401.9) slightly high today, recheck at next visit after pain controlled   Her updated medication list for this problem includes:    Lisinopril-hydrochlorothiazide 10-12.5 Mg Tabs (Lisinopril-hydrochlorothiazide) .Marland Kitchen... Take 1 tablet by mouth once a day  BP today: 133/88 Prior BP: 126/78 (01/25/2010)  Labs Reviewed: K+: 4.5 (06/26/2009) Creat: : 0.82 (06/26/2009)   Chol: 234 (06/26/2009)   HDL: 47 (06/26/2009)   LDL: 158 (06/26/2009)   TG: 147 (06/26/2009)  Problem # 5:  HYPERLIPIDEMIA (ICD-272.4) FLP today Her updated medication list for this problem includes:    Pravastatin Sodium 40 Mg Tabs (Pravastatin sodium) .Marland Kitchen... Take one tablet by mouth each night for cholesterol  Orders: T-Lipid Profile ([id number])  Labs Reviewed: SGOT: 21 (06/26/2009)   SGPT: 22 (06/26/2009)   HDL:47 (06/26/2009), 38 (02/06/2009)  LDL:158 (06/26/2009), 172 (02/06/2009)  Chol:234 (06/26/2009), 237 (02/06/2009)  Trig:147 (06/26/2009), 135 (02/06/2009)  Problem # 6:  HYPOTHYROIDISM (ICD-244.9) recheck TSH today  Her updated medication list for this problem includes:    Synthroid 175 Mcg Tabs (Levothyroxine sodium) .Marland Kitchen... Take 1 tablet by mouth once a day  Orders: T-TSH ([id number]) T-T4, Free (  [id number])  Problem # 7:  OTHER DISEASES OF NASAL CAVITY AND SINUSES (ICD-478.19) complainign of sinus congestion. no s/s of infection dd- gingivitis, migraine, goody powder overuse, viral URTI,   plan - information about dental camp in November - mucinex for symptomiatic releif -  OTC antiallerigc meds - avoid GOody powder - migraine Rx - mouthwash with antiseptic solution  Complete Medication List: 1)  Lisinopril-hydrochlorothiazide 10-12.5 Mg Tabs (Lisinopril-hydrochlorothiazide) .... Take 1 tablet by mouth once a day 2)  Combivent 103-18 Mcg/act Aero (Ipratropium-albuterol) .... 2 puffs three times daily 3)  Omeprazole 40 Mg Cpdr (Omeprazole) .... Take  tablet  twice  a day. 4)  Synthroid 175 Mcg Tabs (Levothyroxine sodium) .... Take 1 tablet by mouth once a day 5)  Centrum Silver Tabs (Multiple vitamins-minerals) .... Take one by mouth once daily 6)  Calcium 600-200 Mg-unit Tabs (Calcium-vitamin d) .... Take two by mouth once daily 7)  Rejuvicare  .... Take one by mouth once daily 8)  Goodys Extra Strength [phone] Mg Pack (Aspirin-acetaminophen-caffeine) .... Take one by mouth as needed 9)  Tylenol Allergy Sinus 2-30-500 Mg Tabs (Chlorphen-pseudoephed-apap) .... Take one by mouth as needed 10)  Probiotic Caps (Probiotic product) .... Two tablets by mouth once daily 11)  Pravastatin Sodium 40 Mg Tabs (Pravastatin sodium) .... Take one tablet by mouth each night for cholesterol 12)  Topiramate 25 Mg Tabs (Topiramate) .... Take 1 tablet by mouth once a day 13)  Sumatriptan Succinate 25 Mg Tabs (Sumatriptan succinate) .Marland Kitchen.. 1 tablets for headache. can repeat after 2 hrs if no improvement 14)  Mucinex Dm 30-600 Mg Xr12h-tab (Dextromethorphan-guaifenesin) .... Take 1 tablet by mouth two times a day as needed for sinus congestion  Patient Instructions: 1)  Please schedule a follow-up appointment in 2 weeks. Prescriptions: MUCINEX DM 30-600 MG XR12H-TAB (DEXTROMETHORPHAN-GUAIFENESIN) Take 1 tablet by mouth two times a day as needed for sinus congestion  #60 x 0   Entered and Authorized by:   Bethel Born MD   Signed by:   Bethel Born MD on 01/29/2010   Method used:   Electronically to        [facility] (retail)       [address]       [place], [place]  [address]       Ph: [phone]       Fax: [phone]   RxID:   [id number] SUMATRIPTAN SUCCINATE 25 MG TABS (SUMATRIPTAN SUCCINATE) 1 tablets for headache. Can repeat after 2 hrs if no improvement  #15 x 0   Entered and Authorized by:   Bethel Born MD   Signed by:   Bethel Born MD on 01/29/2010   Method used:   Electronically to        [facility]  (retail)       [address]       [place], [place]  [address]       Ph: [phone]       Fax: [phone]   RxID:   [id number] TOPIRAMATE 25 MG TABS (TOPIRAMATE) Take 1 tablet by mouth once a day  #31 x 0   Entered and Authorized by:   Bethel Born MD   Signed by:   Bethel Born MD on 01/29/2010   Method used:   Electronically to        [facility] (retail)  [address]       [place], [place]  [address]       Ph: [phone]       Fax: [phone]   RxID:   [id number]    Orders Added: 1)  T-TSH [[id number]] 2)  T-T4, Free [[id number]] 3)  T-Lipid Profile [phone] 4)  Est. Patient Level IV [62376]    Process Orders Check Orders Results:     [facility]: ABN not required for this insurance Tests Sent for requisitioning (January 29, 2010 11:21 AM):     01/29/2010: [facility] -- T-TSH [phone] (signed)     01/29/2010: [facility] -- [place], [place] [[id number]] (signed)     01/29/2010: [facility] -- T-Lipid Profile [phone] (signed)

## 2010-05-14 NOTE — Progress Notes (Signed)
Summary: refill/ hla  Phone Note Refill Request Message from:  Pharmacy on June 26, 2009 9:26 AM  Refills Requested: Medication #1:  COMBIVENT 103-18 MCG/ACT AERO 2 puffs three times daily   Last Refilled: 04/19/2009 Initial call taken by: Marin Roberts RN,  June 26, 2009 9:26 AM  Follow-up for Phone Call       Follow-up by: Blondell Reveal MD,  June 26, 2009 10:11 AM    Prescriptions: COMBIVENT 103-18 MCG/ACT AERO (IPRATROPIUM-ALBUTEROL) 2 puffs three times daily  #30 x 11   Entered and Authorized by:   Blondell Reveal MD   Signed by:   Blondell Reveal MD on 06/26/2009   Method used:   Faxed to ...       [facility] (retail)       [address] [place], [place]  [address]       Ph: [phone]       Fax: [phone]   RxID:   [id number]

## 2010-05-14 NOTE — Assessment & Plan Note (Signed)
Summary: epigastric pain...as.   History of Present Illness Visit Type: Initial Consult Primary GI MD: Stan Head MD [facility] Primary Provider: Blondell Reveal MD Requesting Provider: Blondell Reveal MD Chief Complaint: Epigatric pain, nausea, relieved by vomiting History of Present Illness:   55 yo ww with abdominal pain. a pain that goes across upper abdomen, it may radiate into chest. She eats bland soft foods in this setting. Has been a problem for about 1 year or more. Barrett's esophagus with high-grade dysplasia in 2007. repeat EGD and biopsy was scheduled but she di not come for the procedure. Still using Goody's etc up to 2-3 in a day but my go weeks without. Less than before. Bowels alternate hard and soft, and she will see some bright red blood per rectum on paper and in commode.   GI Review of Systems    Reports abdominal pain, acid reflux, chest pain, nausea, and  vomiting.     Location of  Abdominal pain: epigastric area.    Denies belching, bloating, dysphagia with liquids, dysphagia with solids, heartburn, loss of appetite, vomiting blood, weight loss, and  weight gain.      Reports constipation, diarrhea, hemorrhoids, and  rectal bleeding.     Denies anal fissure, black tarry stools, change in bowel habit, diverticulosis, fecal incontinence, heme positive stool, irritable bowel syndrome, jaundice, light color stool, liver problems, and  rectal pain. Clinical Reports Reviewed:  EGD:  05/28/2005:  Findings: Esophagitis Findings: Hiatal Hernia Location: [facility]   Comments: She is still taking Goody's which is probable cause of antral changes and duodenal changes (? ulcer vs. scar). It looks like she may have Barrett's esophagus.  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  COMMENT   Evaluation of the esophageal biopsy specimen demonstrates   fragments of  gastroesophageal junction mucosa. In one fragment   the glandular mucosa demonstrates marked cytologic atypia with   abundant mitotic activity, consistent with high grade glandular   dysplasia. The dysplasia is present in only one fragment. The   fragment is somewhat tangentially oriented, and an intramucosal   carcinoma cannot be entirely excluded. Dr. Luisa Hart has reviewed   this case and concurs. This case was discussed with Dr. Leone Payor  05/28/2005:  Location: [facility]   Preventive Screening-Counseling & Management  Alcohol-Tobacco     Alcohol drinks/day: <1     Smoking Status: current     Smoking Cessation Counseling: yes     Smoke Cessation Stage: precontemplative     Packs/Day: 0.5     Tobacco Counseling: to quit use of tobacco products  Caffeine-Diet-Exercise     Caffeine use/day: 0 in drinks    Current Medications (verified): 1)  Lisinopril-Hydrochlorothiazide 10-12.5 Mg Tabs (Lisinopril-Hydrochlorothiazide) .... Take 1 Tablet By Mouth Once A Day 2)  Combivent 103-18 Mcg/act Aero (Ipratropium-Albuterol) .... 2 Puffs Three Times Daily 3)  Omeprazole 40 Mg Cpdr (Omeprazole) .... Take 2 Tablets  Twice A Day. 4)  Synthroid 175 Mcg Tabs (Levothyroxine Sodium) .... Take 1 Tablet By Mouth Once A Day 5)  Centrum Silver  Tabs (Multiple Vitamins-Minerals) .... Take One By Mouth Once Daily 6)  Calcium 600-200 Mg-Unit Tabs (Calcium-Vitamin D) .... Take Two By Mouth Once Daily 7)  Rejuvicare .... Take One By Mouth Once Daily 8)  Goodys Extra Strength [phone] Mg Pack (Aspirin-Acetaminophen-Caffeine) .... Take One By Mouth As Needed 9)  Tylenol Allergy Sinus 2-30-500 Mg  Tabs (Chlorphen-Pseudoephed-Apap) .... Take One By Mouth As Needed 10)  Probiotic  Caps (Probiotic Product) .... 2/day  Allergies (verified): 1)  ! Codeine  Past History:  Past Medical History: Reviewed history from 12/10/2009 and no changes required. Hypothyroidism HTN Asthma COPD History of  TIA in 2002 GERD Tobacco abuse Barretts Esophagus Hiatal Hernia  Past Surgical History: Gallbladder surgery 1996 Myomectomy x2 Hysteroscopy  Family History: Reviewed history from 01/10/2009 and no changes required. Mom - 74 yr old, COPD very active Dad - Died at 81 yr old, COPD, 5 heart attacks, first attack in 19's. Only daughter  Social History: 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         All other ROS negative except as per HPI. Headaches not new  Vital Signs:  Patient profile:   55 year old female Height:      63.9 inches Weight:      199.2 pounds BMI:     34.42 Pulse rate:   100 / minute Pulse rhythm:   regular BP sitting:   122 / 78  (left arm) Cuff size:   regular  Vitals Entered By: Harlow Mares CMA [facility] Dull) (December 11, 2009 1:35 PM)  Physical Exam  General:  alert and well-developed.  obese.   Eyes:  PERRLA, no icterus. Mouth:  Oral mucosa and oropharynx without lesions or exudates.  Teeth in good repair. Neck:  Supple; no masses or thyromegaly. Lungs:  Clear throughout to auscultation. Heart:  Regular rate and rhythm; no murmurs, rubs,  or bruits. Abdomen:  Soft, nontender and nondistended. No masses, hepatosplenomegaly or hernias noted. Normal bowel sounds. Rectal:  Female staff present: chronic external hemorrhoids no mass Extremities:  no edema Cervical Nodes:  No significant cervical or supraclavicular adenopathy.  Psych:  Alert and cooperative. Normal mood and affect.   Impression & Recommendations:  Problem # 1:   EPIGASTRIC PAIN (ICD-789.06) Assessment Comment Only She continues to use Goody's etc though less than in past. Need EGD to assess for gastritis, ulcer or upper GI malignany, especially with lack of Barrett's and high-grade dysplasia follow-up.  Problem # 2:  BARRETTS ESOPHAGUS (ICD-530.85) Assessment: Unchanged High-grade dysplasia in 2007 she did not retrn for repeat EGD she was informed of cancer risk then and reminded now she does not seem to have esophageal cancer symptoms at this time but definitely needs repeat EGD  Problem # 3:  RECTAL BLEEDING (ICD-569.3) Assessment: New hemorhoids causing  it seems  Will request colonoscopy report from 2008, sounds like it was Dr. Kinnie Scales as part of research study screening  Patient Instructions: 1)  We will see you at your procedure on 12/18/09. 2)  Upper Endoscopy brochure given.  3)  Copy sent to : Blondell Reveal, MD 4)  The medication list was reviewed and reconciled.  All changed / newly prescribed medications were explained.  A complete medication list was provided to the patient / caregiver.

## 2010-05-14 NOTE — Procedures (Signed)
Summary: Upper Endoscopy w/DIL  Patient: Grace [name] Note: All result statuses are Final unless otherwise noted.  Tests: (1) Upper Endoscopy w/DIL (UED)  UED Upper Endoscopy w/DIL                             DONE     [facility]     [address] [place], [place]  [address]           ENDOSCOPY PROCEDURE REPORT           PATIENT:  [name], [name]  MR#:  [id number]     BIRTHDATE:  [birth year], 54 yrs. old  GENDER:  female           ENDOSCOPIST:  Iva Boop, MD, [facility]           PROCEDURE DATE:  12/18/2009     PROCEDURE:  EGD with biopsy, EGD with balloon dilatation     ASA CLASS:  Class II     INDICATIONS:  1) follow-up of Barrett's Esophagus  2) epigastric     pain           MEDICATIONS:   Fentanyl 50 mcg, Versed 5 mg     TOPICAL ANESTHETIC:  Cetacaine Spray           DESCRIPTION OF PROCEDURE:   After the risks benefits and     alternatives of the procedure were thoroughly explained, informed     consent was obtained.  The  endoscope was introduced through the     mouth and advanced to the second portion of the duodenum, without     limitations.  The instrument was slowly withdrawn as the mucosa     was carefully examined.     <<PROCEDUREIMAGES>>           Barrett's esophagus was found in the distal esophagus. 1 cm tongue     37-38 cm. Multiple biopsies were obtained and sent to pathology.     A hiatal hernia was found. It was 1 - 2 cm in size.  Moderate     gastritis was found in the antrum. Several red spots and some     superficial erosions in the pre-pyloric antrum. Multiple biopsies     were obtained and sent to pathology.  An ulcer was found in the     bulb and descending duodenum. Clean-based 1-1.5 cm. Multiple     biopsies were obtained and sent to pathology.  A stricture was     found in the bulb and descending duodenum. Associated with the     ulcer. Scope passed after dilated to 12 mm.  The examination was     otherwise normal.    Dilation was  then performed at the duodenum           1) Dilator:  Balloon  Size(s):  10, 11, 12 mm     Resistance:  moderate  Heme:     Appearance:  satisfactory           COMPLICATIONS:  None           ENDOSCOPIC IMPRESSION:     1) Barrett's esophagus in the distal esophagus 1 cm tongue     biopsied     2) 1 - 2 cm hiatal hernia     3) Moderate gastritis in the antrum - biopsied     4) Ulcer in the bulb/descending duodenum -  biopsied     5) Stricture in the bulb/descending duodenum - dilated to 12 mm.     Suspect related to ulcer which is due to salicylate use.     6) Otherwise normal examination.     RECOMMENDATIONS:     STOP ALL ASPIRIN, NSAIDS, GOODY'S, BC'S - SEEK DIFFERENT THERAPY     FOR HEADACHES     Await pathology.           REPEAT EXAM:  In for EGD, pending biopsy results. Will need to     confirm healing at some point I suspect.           Iva Boop, MD, [facility]           CC:  Blondell Reveal, M.D.     The Patient           n.     eSIGNED:   Iva Boop at 12/18/2009 11:06 AM           Fernanda Drum, [id number]  Note: An exclamation mark (!) indicates a result that was not dispersed into the flowsheet. Document Creation Date: 12/18/2009 11:07 AM _______________________________________________________________________  (1) Order result status: Final Collection or observation date-time: 12/18/2009 10:54 Requested date-time:  Receipt date-time:  Reported date-time:  Referring Physician:   Ordering Physician: Stan Head [phone]) Specimen Source:  Source: Launa Grill Order Number: [id number] Lab site:

## 2010-05-14 NOTE — Consult Note (Signed)
Summary: [facility]   Imported By: Sherian Rein 12/12/2009 10:56:50  _____________________________________________________________________  External Attachment:    Type:   Image     Comment:   External Document

## 2010-05-14 NOTE — Letter (Signed)
Summary: Results Letter  [facility]  [address] [place], [place] [address]   Phone: [phone]  Fax: [phone]        January 29, 2010 MRN: [id number]    [facility] [address] [place], [place]  [address]    Dear Ms. Eckhart,  Your recent CBC (Complete Blood Count) was normal.  If you have any further questions please call our office at [phone] and ask for [facility].  Sincerely,   Francee Piccolo CMA [facility])  This letter has been electronically signed by your physician.  Appended Document: Results Letter printed and mailed

## 2010-05-14 NOTE — Assessment & Plan Note (Signed)
Summary: est-ck/fu/meds/cfb   Vital Signs:  Patient profile:   55 year old female Height:      63.9 inches (162.31 cm) Weight:      192.6 pounds (87.55 kg) BMI:     33.28 Temp:     97.3 degrees F oral Pulse rate:   108 / minute BP sitting:   109 / 80  (right arm)  Vitals Entered By: Chinita Pester RN (May 03, 2009 1:41 PM) CC: F/U visit; medication refill. Continues to have problems w/her stomach sometimes. Is Patient Diabetic? No Pain Assessment Patient in pain? no      Nutritional Status BMI of > 30 = obese  Have you ever been in a relationship where you felt threatened, hurt or afraid?No   Does patient need assistance? Functional Status Self care Ambulation Normal   Primary Care Provider:  Blondell Reveal MD  CC:  F/U visit; medication refill. Continues to have problems w/her stomach sometimes..  History of Present Illness: 55 yr old lady with PMH as mentioned below presents for regular follow up.  She reports feeling good and denies any specific complaints during this office visit except for mild epigastric pain on and off for a few months. She reports that she is taking only 20 mg of Omeprazole. She denies any melenal , no weight loss. no nausea or vomiting.  Preventive Screening-Counseling & Management  Alcohol-Tobacco     Alcohol drinks/day: rarely     Smoking Status: current     Smoking Cessation Counseling: yes     Packs/Day: 3-4 cigs/day  Caffeine-Diet-Exercise     Does Patient Exercise: no  Problems Prior to Update: 1)  Epigastric Pain  (ICD-789.06) 2)  Unspecified Tachycardia  (ICD-785.0) 3)  Hyperlipidemia  (ICD-272.4) 4)  COPD  (ICD-496) 5)  Tobacco Abuse  (ICD-305.1) 6)  Essential Hypertension  (ICD-401.9) 7)  Hx of Transient Ischemic Attack  (ICD-435.9) 8)  Asthma  (ICD-493.90) 9)  Gerd  (ICD-530.81) 10)  Hypothyroidism  (ICD-244.9) 11)  Hiatal Hernia  (ICD-553.3) 12)  Reflux Esophagitis  (ICD-530.11) 13)  Barretts Esophagus   (ICD-530.85)  Medications Prior to Update: 1)  Synthroid 150 Mcg Tabs (Levothyroxine Sodium) .... Take 1 Tablet By Mouth Daily. 2)  Lisinopril-Hydrochlorothiazide 10-12.5 Mg Tabs (Lisinopril-Hydrochlorothiazide) .... Take 1 Tablet By Mouth Once A Day 3)  Combivent 103-18 Mcg/act Aero (Ipratropium-Albuterol) .... 2 Puffs Three Times Daily 4)  Omeprazole 40 Mg Cpdr (Omeprazole) .... Take 1 Tablet Twice A Day. 5)  Aspir-Low 81 Mg Tbec (Aspirin) .... Take 1 Tablet By Mouth Once A Day  Current Medications (verified): 1)  Synthroid 150 Mcg Tabs (Levothyroxine Sodium) .... Take 1 Tablet By Mouth Daily. 2)  Lisinopril-Hydrochlorothiazide 10-12.5 Mg Tabs (Lisinopril-Hydrochlorothiazide) .... Take 1 Tablet By Mouth Once A Day 3)  Combivent 103-18 Mcg/act Aero (Ipratropium-Albuterol) .... 2 Puffs Three Times Daily 4)  Omeprazole 40 Mg Cpdr (Omeprazole) .... Take 1 Tablet Twice A Day. 5)  Aspir-Low 81 Mg Tbec (Aspirin) .... Take 1 Tablet By Mouth Once A Day  Allergies: No Known Drug Allergies  Past History:  Past Medical History: Last updated: 01/10/2009 Hypothyroidism HTN Asthma COPD History of TIA in 2002 GERD TObacco abuse HIATAL HERNIA (ICD-553.3) REFLUX ESOPHAGITIS (ICD-530.11) BARRETTS ESOPHAGUS (ICD-530.85)  Family History: Last updated: 01/10/2009 Mom - 69 yr old, COPD very active Dad - Died at 94 yr old, COPD, 5 heart attacks, first attack in 39's. 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 married couple of times, divorced, no kids uses her mom social security smokes half a pack a day , cut down from 1ppd drinks alcohol occasionally  Risk Factors: Alcohol Use: rarely (05/03/2009) Exercise: no (05/03/2009)  Risk Factors: Smoking Status: current (05/03/2009) Packs/Day: 3-4 cigs/day (05/03/2009)  Family History: Reviewed history from 01/10/2009 and no changes  required. Mom - 42 yr old, COPD very active Dad - Died at 86 yr old, COPD, 5 heart attacks, first attack in 35's. Only daughter  Social History: Reviewed history from 01/10/2009 and no changes required. 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 married couple of times, divorced, no kids uses her mom social security smokes half a pack a day , cut down from 1ppd drinks alcohol occasionally Packs/Day:  3-4 cigs/day  Review of Systems      See HPI  Physical Exam  General:  alert, well-developed, and well-nourished.   Head:  normocephalic and atraumatic.   Eyes:  vision grossly intact, pupils equal, and pupils round.   Mouth:  Oral mucosa and oropharynx without lesions or exudates.  Teeth in good repair. Neck:  No deformities, masses, or tenderness noted. Lungs:  normal respiratory effort, no intercostal retractions, no accessory muscle use, and normal breath sounds.   Heart:  normal rate and regular rhythm.   Abdomen:  soft, normal bowel sounds, no distention, no masses, no guarding, and no rigidity.  mild epigastric tenderness.  Pulses:  R radial normal.   Extremities:  No clubbing, cyanosis, edema, or deformity noted with normal full range of motion of all joints.   Neurologic:  alert & oriented X3.     Impression & Recommendations:  Problem # 1:  EPIGASTRIC PAIN (ICD-789.06) Suspect secondary to GERD/ Gastritis. Encouraged to consider tobacco cessation. Advised to take Omeprazole 40 mg two times a day for a few weeks. Will follow up in 3 months and if the complaints dont resolve, consider GI referral (she also needs a colonoscopy).  Problem # 2:  TOBACCO ABUSE (ICD-305.1) Encouraged cessation. Seems like cutting down on the number gradually. Offered Nicotine patches but reports cant afford.   Problem # 3:  ESSENTIAL HYPERTENSION (ICD-401.9) Slightly soft today. Reports checking her BP at home on a regular basis and her BP at home  seem to run very good in 120-130's most of the time. Will not make any changes today.  Her updated medication list for this problem includes:    Lisinopril-hydrochlorothiazide 10-12.5 Mg Tabs (Lisinopril-hydrochlorothiazide) .Marland Kitchen... Take 1 tablet by mouth once a day  BP today: 109/80 Prior BP: 128/82 (03/26/2009)  Labs Reviewed: K+: 4.0 (01/10/2009) Creat: : 0.99 (01/10/2009)   Chol: 237 (02/06/2009)   HDL: 38 (02/06/2009)   LDL: 172 (02/06/2009)   TG: 135 (02/06/2009)  Problem # 4:  HYPOTHYROIDISM (ICD-244.9) Dose recently been decreased to 150. TSH was WNL and T4 was slightly above the normal limits. Will follow up in 3 months and repeat TSH.   Her updated medication list for this problem includes:    Synthroid 150 Mcg Tabs (Levothyroxine sodium) .Marland Kitchen... Take 1 tablet by mouth daily.  Complete Medication List: 1)  Synthroid 150 Mcg Tabs (Levothyroxine sodium) .... Take 1 tablet by mouth daily. 2)  Lisinopril-hydrochlorothiazide 10-12.5 Mg Tabs (Lisinopril-hydrochlorothiazide) .... Take 1 tablet by mouth once a day 3)  Combivent 103-18 Mcg/act Aero (Ipratropium-albuterol) .... 2 puffs three times daily 4)  Omeprazole 40 Mg  Cpdr (Omeprazole) .... Take 1 tablet twice a day. 5)  Aspir-low 81 Mg Tbec (Aspirin) .... Take 1 tablet by mouth once a day  Patient Instructions: 1)  Please schedule a follow-up appointment in 3 months. 2)  Take all the medications as advised below. 3)  Tobacco is very bad for your health and your loved ones! You Should stop smoking!. 4)  Stop Smoking Tips: Choose a Quit date. Cut down before the Quit date. decide what you will do as a substitute when you feel the urge to smoke(gum,toothpick,exercise). Prescriptions: OMEPRAZOLE 40 MG CPDR (OMEPRAZOLE) Take 1 tablet twice a day.  #60 x 3   Entered and Authorized by:   Blondell Reveal MD   Signed by:   Blondell Reveal MD on 05/03/2009   Method used:   Print then Give to Patient   RxID:    [id number] LISINOPRIL-HYDROCHLOROTHIAZIDE 10-12.5 MG TABS (LISINOPRIL-HYDROCHLOROTHIAZIDE) Take 1 tablet by mouth once a day  #30 x 3   Entered and Authorized by:   Blondell Reveal MD   Signed by:   Blondell Reveal MD on 05/03/2009   Method used:   Print then Give to Patient   RxID:   [id number]   Prevention & Chronic Care Immunizations   Influenza vaccine: Fluvax Non-MCR  (02/06/2009)   Influenza vaccine deferral: Not indicated  (03/26/2009)    Tetanus booster: Not documented   Td booster deferral: Deferred  (05/03/2009)    Pneumococcal vaccine: Not documented  Colorectal Screening   Hemoccult: Not documented   Hemoccult action/deferral: Refused  (05/03/2009)    Colonoscopy: Not documented   Colonoscopy action/deferral: Not indicated  (03/26/2009)  Other Screening   Pap smear: Not documented   Pap smear action/deferral: Deferred  (03/26/2009)    Mammogram: ASSESSMENT: Negative - BI-RADS 1^MM DIGITAL SCREENING  (04/16/2009)   Mammogram action/deferral: Ordered  (03/26/2009)   Smoking status: current  (05/03/2009)   Smoking cessation counseling: yes  (05/03/2009)  Lipids   Total Cholesterol: 237  (02/06/2009)   LDL: 172  (02/06/2009)   LDL Direct: Not documented   HDL: 38  (02/06/2009)   Triglycerides: 135  (02/06/2009)    SGOT (AST): 38  (01/10/2009)   SGPT (ALT): 16  (01/10/2009)   Alkaline phosphatase: 61  (01/10/2009)   Total bilirubin: 0.3  (01/10/2009)  Hypertension   Last Blood Pressure: 109 / 80  (05/03/2009)   Serum creatinine: 0.99  (01/10/2009)   Serum potassium 4.0  (01/10/2009)  Self-Management Support :    Patient will work on the following items until the next clinic visit to reach self-care goals:     Medications and monitoring: check my blood pressure  (05/03/2009)     Activity: take a 30 minute walk every day  (05/03/2009)    Hypertension self-management support: Written self-care plan  (05/03/2009)   Hypertension self-care plan  printed.    Lipid self-management support: Written self-care plan  (05/03/2009)   Lipid self-care plan printed.

## 2010-05-14 NOTE — Progress Notes (Signed)
Summary: Medication  Phone Note Call from Patient   Caller: Patient Summary of Call: Calll from pt wanted to know if she could get her Topiramate 25 mg tablets and her Sumatriptan [facility].  call to [facility] who said that pt will be able to get samples from [facility].  Prescriptions for were faxed to [facility]. Pt was called and notified of.   Initial call taken by: Angelina Ok RN,  January 29, 2010 4:49 PM

## 2010-05-14 NOTE — Assessment & Plan Note (Signed)
Summary: new meds recheck[mkj]   Vital Signs:  Patient profile:   55 year old female Height:      63.9 inches Weight:      198.2 pounds BMI:     34.25 Temp:     97.3 degrees F oral Pulse rate:   89 / minute BP sitting:   131 / 82  (right arm)  Vitals Entered By: Filomena Jungling NT II (June 19, 2009 10:57 AM) CC: BLOOD PRESSURE AND THYROID Is Patient Diabetic? No Pain Assessment Patient in pain? no      Nutritional Status BMI of > 30 = obese  Does patient need assistance? Functional Status Self care Ambulation Normal   Primary Care Provider:  Blondell Reveal MD  CC:  BLOOD PRESSURE AND THYROID.  History of Present Illness: Grace [name] is a 55 year old Female with PMH/problems as outlined in the EMR, who presents to the [facility] with chief complaint(s) of:    1. Hypothyroidism: recently reduced dose of thyroid meds, it's time for a recheck 2. abd pain: was started on ppi for dyspepsia, still hurting off and on, denies hematemesis, melena, weight loss or any alarm signs, but ppi not helping much.      Current Medications (verified): 1)  Synthroid 150 Mcg Tabs (Levothyroxine Sodium) .... Take 1 Tablet By Mouth Daily. 2)  Lisinopril-Hydrochlorothiazide 10-12.5 Mg Tabs (Lisinopril-Hydrochlorothiazide) .... Take 1 Tablet By Mouth Once A Day 3)  Combivent 103-18 Mcg/act Aero (Ipratropium-Albuterol) .... 2 Puffs Three Times Daily 4)  Omeprazole 40 Mg Cpdr (Omeprazole) .... Take 1 Tablet Twice A Day. 5)  Aspir-Low 81 Mg Tbec (Aspirin) .... Take 1 Tablet By Mouth Once A Day  Allergies (verified): No Known Drug Allergies  Past History:  Past Medical History: Last updated: 01/10/2009 Hypothyroidism HTN Asthma COPD History of TIA in 2002 GERD TObacco abuse HIATAL HERNIA (ICD-553.3) REFLUX ESOPHAGITIS (ICD-530.11) BARRETTS ESOPHAGUS (ICD-530.85)  Past Surgical History: Last updated: 01/20/2008 Gallbladder surgery 1996  Family History: Last updated: 01/10/2009 Mom - 41  yr old, COPD very active Dad - Died at 32 yr old, COPD, 5 heart attacks, first attack in 37's. 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 married couple of times, divorced, no kids uses her mom social security smokes half a pack a day , cut down from 1ppd drinks alcohol occasionally  Risk Factors: Alcohol Use: rarely (05/03/2009) Exercise: no (05/03/2009)  Risk Factors: Smoking Status: current (05/03/2009) Packs/Day: 3-4 cigs/day (05/03/2009)  Review of Systems       as per HPI  Physical Exam  General:  alert and well-developed.   Neck:  supple, no thyromegaly Lungs:  normal respiratory effort and normal breath sounds.   Heart:  normal rate and regular rhythm.   Abdomen:  soft and non-tender.   Pulses:  normal peripheral pulses  Extremities:  no cyanosis, clubbing or edema  Neurologic:  non focal Psych:  normally interactive.     Impression & Recommendations:  Problem # 1:  EPIGASTRIC PAIN (ICD-789.06) I reviewed the evaluation by Dr. Comer Locket from last visit. I think it will be reasonable to go ahead with gi referral for endoscopy given her age and questionable history of barrett's.   Problem # 2:  HYPERLIPIDEMIA (ICD-272.4) Chol: 237 (02/06/2009 7:48:00 PM)HDL:  38 (02/06/2009 7:48:00 PM)LDL:  172 (02/06/2009 7:48:00 PM)Tri:   AST:  38 (01/10/2009 8:09:00 PM) ALT:  16 (01/10/2009 8:09:00  PM)T. Bili:  0.3 (01/10/2009 8:09:00 PM) AP:  61 (01/10/2009 8:09:00 PM)  Patient will need a repeat updated flp, will order one today. Will consider life style modification +/- meds. She tells me she had muscle problems with statin in the past.   Problem # 3:  ESSENTIAL HYPERTENSION (ICD-401.9) Well-controlled. Continue the current regimen.   Her updated medication list for this problem includes:    Lisinopril-hydrochlorothiazide 10-12.5 Mg Tabs (Lisinopril-hydrochlorothiazide)  .Marland Kitchen... Take 1 tablet by mouth once a day  Orders: T-Lipid Profile ([id number])  Problem # 4:  HYPOTHYROIDISM (ICD-244.9) Clinically euthyroid. will check tsh today.   Her updated medication list for this problem includes:    Synthroid 150 Mcg Tabs (Levothyroxine sodium) .Marland Kitchen... Take 1 tablet by mouth daily.  Complete Medication List: 1)  Synthroid 150 Mcg Tabs (Levothyroxine sodium) .... Take 1 tablet by mouth daily. 2)  Lisinopril-hydrochlorothiazide 10-12.5 Mg Tabs (Lisinopril-hydrochlorothiazide) .... Take 1 tablet by mouth once a day 3)  Combivent 103-18 Mcg/act Aero (Ipratropium-albuterol) .... 2 puffs three times daily 4)  Omeprazole 40 Mg Cpdr (Omeprazole) .... Take 1 tablet twice a day. 5)  Aspir-low 81 Mg Tbec (Aspirin) .... Take 1 tablet by mouth once a day  Other Orders: T-Comprehensive Metabolic Panel ([id number]) T-TSH [id number]) Gastroenterology Referral (GI)  Patient Instructions: 1)  Please come back for a fasting blood test.  2)  We will let you know if anything wrong with your lab work.   3)  We will make a referral to the gastroenterologist for endoscopy.  Process Orders Check Orders Results:     [facility]: ABN not required for this insurance Tests Sent for requisitioning (June 19, 2009 3:52 PM):     06/19/2009: [facility] -- T-Comprehensive Metabolic Panel [[id number]] (signed)     06/19/2009: [facility] -- T-TSH [phone] (signed)     06/19/2009: [facility] -- T-Lipid Profile [phone] (signed)    Prevention & Chronic Care Immunizations   Influenza vaccine: Fluvax Non-MCR  (02/06/2009)   Influenza vaccine deferral: Not indicated  (03/26/2009)    Tetanus booster: Not documented   Td booster deferral: Deferred  (05/03/2009)    Pneumococcal vaccine: Not documented  Colorectal Screening   Hemoccult: Not documented   Hemoccult action/deferral: Refused  (05/03/2009)     Colonoscopy: Not documented   Colonoscopy action/deferral: Not indicated  (03/26/2009)  Other Screening   Pap smear: Not documented   Pap smear action/deferral: Deferred  (03/26/2009)    Mammogram: ASSESSMENT: Negative - BI-RADS 1^MM DIGITAL SCREENING  (04/16/2009)   Mammogram action/deferral: Ordered  (03/26/2009)   Smoking status: current  (05/03/2009)   Smoking cessation counseling: yes  (05/03/2009)  Lipids   Total Cholesterol: 237  (02/06/2009)   LDL: 172  (02/06/2009)   LDL Direct: Not documented   HDL: 38  (02/06/2009)   Triglycerides: 135  (02/06/2009)    SGOT (AST): 38  (01/10/2009)   SGPT (ALT): 16  (01/10/2009) CMP ordered    Alkaline phosphatase: 61  (01/10/2009)   Total bilirubin: 0.3  (01/10/2009)    Lipid flowsheet reviewed?: Yes   Progress toward LDL goal: Unchanged  Hypertension   Last Blood Pressure: 131 / 82  (06/19/2009)   Serum creatinine: 0.99  (01/10/2009)   Serum potassium 4.0  (01/10/2009) CMP ordered     Hypertension flowsheet reviewed?: Yes   Progress toward BP goal: At goal  Self-Management Support :    Patient will work on the following items until the next clinic  visit to reach self-care goals:     Medications and monitoring: take my medicines every day  (06/19/2009)     Eating: eat more vegetables, eat foods that are low in salt, eat baked foods instead of fried foods, eat fruit for snacks and desserts  (06/19/2009)     Activity: take a 30 minute walk every day  (06/19/2009)    Hypertension self-management support: Education handout, Resources for patients handout  (06/19/2009)   Hypertension education handout printed    Lipid self-management support: Resources for patients handout  (06/19/2009)        Resource handout printed.  Process Orders Check Orders Results:     [facility]: ABN not required for this insurance Tests Sent for requisitioning (June 19, 2009 3:52 PM):     06/19/2009: [facility] -- T-Comprehensive Metabolic Panel [[id number]] (signed)     06/19/2009: [facility] -- T-TSH [id number] (signed)     06/19/2009: [facility] -- T-Lipid Profile [phone] (signed)

## 2010-05-14 NOTE — Progress Notes (Signed)
Summary: med refill/gp  Phone Note Refill Request Message from:  Fax from Pharmacy on October 31, 2009 10:31 AM  Refills Requested: Medication #1:  LISINOPRIL-HYDROCHLOROTHIAZIDE 10-12.5 MG TABS Take 1 tablet by mouth once a day   Last Refilled: 09/21/2009 Last appt.08/13/09.   Method Requested: Electronic Initial call taken by: Chinita Pester RN,  October 31, 2009 10:31 AM  Follow-up for Phone Call       Follow-up by: Blondell Reveal MD,  November 01, 2009 2:13 PM    Prescriptions: LISINOPRIL-HYDROCHLOROTHIAZIDE 10-12.5 MG TABS (LISINOPRIL-HYDROCHLOROTHIAZIDE) Take 1 tablet by mouth once a day  #30 x 11   Entered and Authorized by:   Blondell Reveal MD   Signed by:   Blondell Reveal MD on 11/01/2009   Method used:   Electronically to        [facility] (retail)       [address]       [place], [place]  [address]       Ph: [phone]       Fax: [phone]   RxID:   [id number]

## 2010-05-14 NOTE — Progress Notes (Signed)
Summary: refill/gg  Phone Note Refill Request  on June 27, 2009 2:53 PM  Refills Requested: Medication #1:  SYNTHROID 150 MCG TABS Take 1 tablet by mouth daily.   Last Refilled: 06/14/2009  Method Requested: Fax to Local Pharmacy Initial call taken by: Merrie Roof RN,  June 27, 2009 2:53 PM  Follow-up for Phone Call       Follow-up by: Blondell Reveal MD,  June 27, 2009 7:54 PM    Prescriptions: SYNTHROID 150 MCG TABS (LEVOTHYROXINE SODIUM) Take 1 tablet by mouth daily.  #30 x 11   Entered and Authorized by:   Blondell Reveal MD   Signed by:   Blondell Reveal MD on 06/27/2009   Method used:   Faxed to ...       [facility] (retail)       [address] [place], [place]  [address]       Ph: [phone]       Fax: [phone]   RxID:   [id number]

## 2010-05-14 NOTE — Procedures (Signed)
Summary: [facility]  [facility]   Imported By: Lennie Odor 02/21/2010 15:44:22  _____________________________________________________________________  External Attachment:    Type:   Image     Comment:   External Document

## 2010-05-14 NOTE — Progress Notes (Signed)
Summary: Med List  Phone Note From Other Clinic Call back at [phone]   Caller: Nurse Call For: Dr. Stan Head Request: Talk with Nurse Summary of Call: Dondra Spry from Dr. Bernita Buffy office would like to speak to you regarding pt's med list. Initial call taken by: Schuyler Amor,  December 11, 2009 2:47 PM  Follow-up for Phone Call        per Dondra Spry, she has received a refill request for pravastatin, but we removed that med from her med list at her visit today.  Fausto Skillern that pt told us she  was no longer taking that medication so we took it off her list.  Pt brought written list to office. Dondra Spry will call pt to verify she is taking pravastatin. Follow-up by: Francee Piccolo CMA Duncan Dull),  December 11, 2009 3:03 PM

## 2010-05-14 NOTE — Progress Notes (Signed)
Summary: [facility]   Imported By: Sherian Rein 12/12/2009 10:59:18  _____________________________________________________________________  External Attachment:    Type:   Image     Comment:   External Document

## 2010-05-14 NOTE — Progress Notes (Signed)
Summary: refill/gg  Phone Note Refill Request  on December 11, 2009 3:14 PM  Pravastatin 40 mg was d/c from pt med list by Dr Leone Payor today.  I talked with pt and she is still taking med and needs refill.   Method Requested: Electronic Initial call taken by: Merrie Roof RN,  December 11, 2009 3:14 PM    New/Updated Medications: PRAVASTATIN SODIUM 40 MG TABS (PRAVASTATIN SODIUM) Take one tablet by mouth each night for cholesterol Prescriptions: PRAVASTATIN SODIUM 40 MG TABS (PRAVASTATIN SODIUM) Take one tablet by mouth each night for cholesterol  #30 x 6   Entered and Authorized by:   Doneen Poisson MD   Signed by:   Doneen Poisson MD on 12/11/2009   Method used:   Electronically to        [facility] (retail)       [address]       [place], [place]  [address]       Ph: [phone]       Fax: [phone]   RxID:   [id number]

## 2010-05-14 NOTE — Letter (Signed)
Summary: New Patient letter  [facility]  [address] [place], [place] [address]   Phone: [phone]  Fax: [phone]       10/19/2009 MRN: [id number]  [facility] [address] [place], [place]  [address]  Dear Grace [name],  Welcome to the Gastroenterology Division at [facility].    You are scheduled to see Dr. Leone Payor on 12/11/2009 at 1:45PM on the 3rd floor at [facility], [address]  We ask that you try to arrive at our office 15 minutes prior to your appointment time to allow for check-in.  We would like you to complete the enclosed self-administered evaluation form prior to your visit and bring it with you on the day of your appointment.  We will review it with you.  Also, please bring a complete list of all your medications or, if you prefer, bring the medication bottles and we will list them.  Please bring your insurance card so that we may make a copy of it.  If your insurance requires a referral to see a specialist, please bring your referral form from your primary care physician.  Co-payments are due at the time of your visit and may be paid by cash, check or credit card.     Your office visit will consist of a consult with your physician (includes a physical exam), any laboratory testing he/she may order, scheduling of any necessary diagnostic testing (e.g. x-ray, ultrasound, CT-scan), and scheduling of a procedure (e.g. Endoscopy, Colonoscopy) if required.  Please allow enough time on your schedule to allow for any/all of these possibilities.    If you cannot keep your appointment, please call [phone] to cancel or reschedule prior to your appointment date.  This allows [place] the opportunity to schedule an appointment for another patient in need of care.  If you do not cancel or reschedule by 5 p.m. the business day prior to your appointment date, you will be charged a $50.00 late cancellation/no-show fee.    Thank you for choosing [facility] for your medical needs.  We appreciate the opportunity to care for you.  Please visit [place] at our website  to learn more about our practice.                     Sincerely,                                                             The Gastroenterology Division

## 2010-05-14 NOTE — Progress Notes (Signed)
Summary: June 25, 2005   [facility]   Imported By: Sherian Rein 12/12/2009 11:00:32  _____________________________________________________________________  External Attachment:    Type:   Image     Comment:   External Document

## 2010-05-14 NOTE — Assessment & Plan Note (Signed)
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 also has Barrett's esophagus with ? low-grade dysplasia. Says she is not much different after EGD and balloon dilation of duodenal stricture related to ulcers, NSAID's. She had lifted a 30# bag of cat litter and then developed nausea, one episode of small volume emesis. That lasted a few days and resolved. Otherwise she has been ok. She says she is using some Goody's, only 3 in the last week, whichi is much less. Has reduced Pepsi to 16 oz max/day   GI Review of Systems    Reports nausea.      Denies abdominal pain, acid reflux, belching, bloating, chest pain, dysphagia with liquids, dysphagia with solids, heartburn, loss of appetite, vomiting, vomiting blood, weight loss, and  weight gain.        Denies anal fissure, black tarry stools, change in bowel habit, constipation, diarrhea, diverticulosis, fecal incontinence, heme positive stool, hemorrhoids, irritable bowel syndrome, jaundice, light color stool, liver problems, rectal bleeding, and  rectal pain.    EGD  Procedure date:  12/18/2009  Findings:      1) Barrett's esophagus in the distal esophagus 1 cm tongue     biopsied     2) 1 - 2 cm hiatal hernia     3) Moderate gastritis in the antrum - biopsied     4) Ulcer in the bulb/descending duodenum - biopsied     5) Stricture in the bulb/descending duodenum - dilated to 12 mm.     Suspect related to ulcer which is due to salicylate use.     6) Otherwise normal examination. 1. Esophagus, biopsy, Barrett''s :  - INTESTINAL METAPLASIA CONSISTENT WITH BARRETT'S ESOPHAGUS. 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 - SEE COMMENT.   2. Stomach, biopsy, gastritis antral :  BENIGN GASTRIC MUCOSA.NO HELICOBACTER PYLORI, INTESTINAL METAPLASIA OR ACTIVE INFLAMMATION IDENTIFIED. 3. Duodenum, biopsy,  :  - BENIGN DUODENAL MUCOSA WITH EROSION, EDEMA AND FOCAL GASTRIC METAPLASIA.   Comments:      also needs closer follow-up within 1 year due to focus of low-grade dysplasia  Procedures Next Due Date:    EGD: 12/2012   Current Medications (verified): 1)  Lisinopril-Hydrochlorothiazide 10-12.5 Mg Tabs (Lisinopril-Hydrochlorothiazide) .... Take 1 Tablet By Mouth Once A Day 2)  Combivent 103-18 Mcg/act Aero (Ipratropium-Albuterol) .... 2 Puffs Three Times Daily 3)  Omeprazole 40 Mg Cpdr (Omeprazole) .... Take  Tablet  Twice A Day. 4)  Synthroid 175 Mcg Tabs (Levothyroxine Sodium) .... Take 1 Tablet By Mouth Once A Day 5)  Centrum Silver  Tabs (Multiple Vitamins-Minerals) .... Take One By Mouth Once Daily 6)  Calcium 600-200 Mg-Unit Tabs (Calcium-Vitamin D) .... Take Two By Mouth Once Daily 7)  Rejuvicare .... Take One By Mouth Once Daily 8)  Goodys Extra Strength [phone] Mg Pack (Aspirin-Acetaminophen-Caffeine) .... Take One By Mouth As Needed 9)  Tylenol Allergy Sinus 2-30-500 Mg Tabs (Chlorphen-Pseudoephed-Apap) .... Take One By Mouth As Needed 10)  Probiotic  Caps (Probiotic Product) .... Two Tablets By Mouth Once Daily 11)  Pravastatin Sodium 40 Mg Tabs (Pravastatin Sodium) .... Take One  Tablet By Mouth Each Night For Cholesterol  Allergies (verified): 1)  ! Codeine  Past History:  Past Medical History: Hypothyroidism HTN Asthma COPD History of TIA in 2002 GERD Tobacco abuse Barretts Esophagus Hiatal Hernia Gastric Ulcer/Stricture Hyperlipidemia  Past Surgical History: Reviewed history from 12/11/2009 and no changes required. Gallbladder surgery 1996 Myomectomy x2 Hysteroscopy  Family History: Mom - 45 yr old, COPD very active Dad - Died at 11 yr  old, COPD, 5 heart attacks, first attack in 48's. Only daughter No FH of Colon Cancer:  Vital Signs:  Patient profile:   55 year old female Height:      63.9 inches Weight:      196 pounds BMI:     33.87 BSA:     1.94 Pulse rate:   104 / minute Pulse rhythm:   regular BP sitting:   126 / 78  (left arm) Cuff size:   regular  Vitals Entered By: Ok Anis CMA (January 25, 2010 2:23 PM)  Physical Exam  General:  alert and well-developed.  obese.   Eyes:   no icterus. Abdomen:  Soft and mildly tender in epigastrium otherwise obes, no HSM, mass  Psych:  Alert and cooperative. Normal mood and affect.   Impression & Recommendations:  Problem # 1:  CHRON DUOD ULCER W/O MENTION HEMORR/PERF W/OBST (ICD-532.71) Assessment Improved About the same to somewhat better. Given chronicity and salicylate issue, not surprised. Continue to wean salicylates and take PPI. REV before rescoping. Orders: TLB-CBC Platelet - w/Differential (85025-[id number])  Problem # 2:  BARRETTS ESOPHAGUS (ICD-530.85) Assessment: Unchanged Now has low-grade dysplasia so will need closer follow-up. will reassess with EGD likely before end of year but within 1 year for sure.  Problem # 3:  LONG-TERM USE NON-STEROIDAL ANTI-INFLAMMATORIES (ICD-V58.64) Assessment: Improved she is reducing Goody's and needs to eliminate. To discuss with PCP re: headache tx  Problem # 4:  PERSONAL HISTORY OF FAILED MODERATE SEDATION (ICD-V15.80) Assessment: New will need propofol in the future  Problem # 5:  RECTAL BLEEDING (ICD-569.3) Assessment: Unchanged suspect hemorrhids awaiting colonoscopy report  Problem # 6:  HEADACHE, CHRONIC (ICD-784.0) Assessment: Unchanged to discuss non-salicylate tx w/PCP ? use TCA?  Patient Instructions: 1)  Please continue current medications.  2)  Please schedule a follow-up appointment in 6 weeks.  3)  Please go to the basement to have your lab tests drawn today.  4)  We will call you  with further follow up after reviewing these results.  5)  Please continue trying to stop Goodies powders. 6)  Copy sent to : Blondell Reveal, MD  7)  The medication list was reviewed and reconciled.  All changed / newly prescribed medications were explained.  A complete medication list was provided to the patient / caregiver. Patient: Grace [name] Note: All result statuses are Final unless otherwise noted.  Tests: (1) CBC Platelet w/Diff (CBCD)   White Cell Count          7.9 K/uL                    4.5-10.5   Red Cell Count            4.26 Mil/uL                 3.87-5.11   Hemoglobin                12.3 g/dL  12.0-15.0   Hematocrit                36.8 %                      36.0-46.0   MCV                       86.5 fl                     78.0-100.0   MCHC                      33.4 g/dL                   96.0-45.4   RDW                  [H]  15.8 %                      11.5-14.6   Platelet Count            293.0 K/uL                  150.0-400.0   Neutrophil %              58.9 %                      43.0-77.0   Lymphocyte %              30.7 %                      12.0-46.0   Monocyte %                7.4 %                       3.0-12.0   Eosinophils%              2.6 %                       0.0-5.0   Basophils %               0.4 %                       0.0-3.0   Neutrophill Absolute      4.7 K/uL                    1.4-7.7   Lymphocyte Absolute       2.4 K/uL                    0.7-4.0   Monocyte Absolute         0.6 K/uL                    0.1-1.0  Eosinophils, Absolute                             0.2 K/uL                    0.0-0.7   Basophils Absolute        0.0 K/uL  0.0-0.1  Note: An exclamation mark (!) indicates a result that was not dispersed into the flowsheet. Document Creation Date: 01/25/2010 4:03 PM ____________________________________________________________

## 2010-05-14 NOTE — Progress Notes (Signed)
Summary: CBC normal  Phone Note Outgoing Call   Summary of Call: CBC is normal - please notify ( a letter is ok) Iva Boop MD, [facility]  January 28, 2010 7:02 AM   Follow-up for Phone Call        LM to [facility] on work voicemail --believe this is cell number--Stephanie Vear Clock CMA Duncan Dull)  January 28, 2010 11:06 AM  Letter mailed. Follow-up by: Francee Piccolo CMA Duncan Dull),  January 29, 2010 9:36 AM

## 2010-05-14 NOTE — Procedures (Signed)
Summary: Colonoscopy- [name] - NORMAL - 10/08/06    Colonoscopy  Procedure date:  10/08/2006  Findings:      Results: Normal.  [name] (part of IBS study)  Procedures Next Due Date:    Colonoscopy: 10/2016

## 2010-05-16 NOTE — Progress Notes (Signed)
Summary: phone/gg  Phone Note Call from Patient   Summary of Call: Pt called with c/o sinus infection.  has green nasal d/c since last week.  Also sneezing and congested. She has tried saline spray and mucinex.  Denies fever but has headache. Pt advised to be evaluated at [facility] as it's Friday and we close in 1 hour. Initial call taken by: Merrie Roof RN,  April 19, 2010 4:05 PM

## 2010-05-16 NOTE — Progress Notes (Signed)
Summary: refill/ hla  Phone Note Refill Request Message from:  Fax from Pharmacy on March 26, 2010 11:05 AM  Refills Requested: Medication #1:  SYNTHROID 175 MCG TABS Take 1 tablet by mouth once a day   Last Refilled: 9/20 Initial call taken by: Marin Roberts RN,  March 26, 2010 11:05 AM  Follow-up for Phone Call       Follow-up by: Blondell Reveal MD,  March 27, 2010 7:13 AM    Prescriptions: SYNTHROID 175 MCG TABS (LEVOTHYROXINE SODIUM) Take 1 tablet by mouth once a day  #30 x 11   Entered and Authorized by:   Blondell Reveal MD   Signed by:   Blondell Reveal MD on 03/27/2010   Method used:   Faxed to ...       [facility] Department (retail)       [address] [place], [place]  [address]       Ph: [phone]       Fax: [phone]   RxID:   [id number]

## 2010-05-16 NOTE — Progress Notes (Signed)
Summary: refill/gg  Phone Note Refill Request  on April 19, 2010 4:11 PM  SUMATRIPTAN SUCCINATE 25 MG TABS (SUMATRIPTAN SUCCINATE) 1 tablets for headache, pt can not  afford this med.  can you call in something to [facility] for headaches.   Method Requested: Fax to Local Pharmacy Initial call taken by: Merrie Roof RN,  April 19, 2010 4:12 PM  Follow-up for Phone Call       Follow-up by: Blondell Reveal MD,  April 20, 2010 2:55 PM    Prescriptions: SUMATRIPTAN SUCCINATE 25 MG TABS (SUMATRIPTAN SUCCINATE) 1 tablets for headache. Can repeat after 2 hrs if no improvement  #15 x 0   Entered and Authorized by:   Blondell Reveal MD   Signed by:   Blondell Reveal MD on 04/20/2010   Method used:   Faxed to ...       [facility] Department (retail)       [address] [place], [place]  [address]       Ph: [phone]       Fax: [phone]   RxID:   [id number]

## 2010-05-16 NOTE — Progress Notes (Signed)
Summary: refill/ hla  Phone Note From Pharmacy   Summary of Call: imitrex tabs are not available through [facility], these are available : maxalt, relpax, frova, axert, could you prescribe one of these? Initial call taken by: Marin Roberts RN,  May 01, 2010 4:17 PM  Follow-up for Phone Call        Will change it to Relpax. Follow-up by: Blondell Reveal MD,  May 02, 2010 2:22 PM    New/Updated Medications: RELPAX 20 MG TABS (ELETRIPTAN HYDROBROMIDE) Take one tablet as needed for migraine headache. Do not exceed more than 4 tab per a day Prescriptions: RELPAX 20 MG TABS (ELETRIPTAN HYDROBROMIDE) Take one tablet as needed for migraine headache. Do not exceed more than 4 tab per a day  #30 x 2   Entered and Authorized by:   Blondell Reveal MD   Signed by:   Blondell Reveal MD on 05/02/2010   Method used:   Faxed to ...       [facility] Department (retail)       [address] [place], [place]  [address]       Ph: [phone]       Fax: [phone]   RxID:   [id number]

## 2010-05-17 ENCOUNTER — Other Ambulatory Visit: Payer: Self-pay | Admitting: *Deleted

## 2010-05-17 MED ORDER — IPRATROPIUM-ALBUTEROL 18-103 MCG/ACT IN AERO: 2.0000 | INHALATION_SPRAY | Freq: Three times a day (TID) | RESPIRATORY_TRACT | Status: DC

## 2010-05-17 NOTE — Telephone Encounter (Signed)
Last refill 03/15/10

## 2010-05-31 ENCOUNTER — Ambulatory Visit (INDEPENDENT_AMBULATORY_CARE_PROVIDER_SITE_OTHER): Payer: Self-pay | Admitting: Internal Medicine

## 2010-05-31 ENCOUNTER — Encounter: Payer: Self-pay | Admitting: Internal Medicine

## 2010-05-31 DIAGNOSIS — G43009 Migraine without aura, not intractable, without status migrainosus: Secondary | ICD-10-CM

## 2010-05-31 DIAGNOSIS — Z791 Long term (current) use of non-steroidal anti-inflammatories (NSAID): Secondary | ICD-10-CM

## 2010-05-31 DIAGNOSIS — K267 Chronic duodenal ulcer without hemorrhage or perforation: Secondary | ICD-10-CM

## 2010-06-05 NOTE — Assessment & Plan Note (Signed)
Summary: follow up/sched w-pt/res from [age]/yf   Vital Signs:  Patient profile:   55 year old female Height:      63.5 inches Weight:      191.8 pounds BMI:     33.56 Pulse rate:   64 / minute Pulse rhythm:   regular BP sitting:   104 / 68  (left arm) Cuff size:   regular  Vitals Entered By: June McMurray CMA Duncan Dull) (May 31, 2010 9:53 AM)  History of Present Illness Visit Type: Follow-up Visit Primary GI MD: Stan Head MD [facility] Primary Provider: Dr. 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 She has reduced Goody's and is also using Excedrin. She had been prescribed sumitriptan and topirimate but paient assistance not available and she is unable to get them. She also describes tachycardia problems related to Imitrex (sumitriptan) in the past. She takes at least one excedrin or Goody's a day and usually twice a day.  She has intermittent epigastric pain problems that occur every 1-2 weeks at least, can be severe. Some nausea, rare vomiting.     GI Review of Systems    Reports abdominal pain, acid reflux, bloating, and  nausea.     Location of  Abdominal pain: epigastric area.    Denies belching, chest pain, dysphagia with liquids, dysphagia with solids, heartburn, loss of appetite, vomiting, vomiting blood, weight loss, and  weight gain.      Reports constipation and  diarrhea.     Denies anal fissure, black tarry stools, change in bowel habit, diverticulosis, fecal incontinence, heme positive stool, hemorrhoids, irritable bowel syndrome, jaundice, light color stool, liver problems, rectal bleeding, and  rectal pain.  Current Medications (verified): 1)  Lisinopril-Hydrochlorothiazide 10-12.5 Mg Tabs (Lisinopril-Hydrochlorothiazide) .... Take 1 Tablet By Mouth Once A Day 2)  Combivent 103-18 Mcg/act Aero  (Ipratropium-Albuterol) .... 2 Puffs Three Times Daily 3)  Omeprazole 40 Mg Cpdr (Omeprazole) .... Take  Tablet  Twice A Day. 4)  Synthroid 175 Mcg Tabs (Levothyroxine Sodium) .... Take 1 Tablet By Mouth Once A Day 5)  Tylenol Allergy Sinus 2-30-500 Mg Tabs (Chlorphen-Pseudoephed-Apap) .... Take One By Mouth As Needed 6)  Pravastatin Sodium 40 Mg Tabs (Pravastatin Sodium) .... Take One Tablet By Mouth Each Night For Cholesterol 7)  Mucinex Dm 30-600 Mg Xr12h-Tab (Dextromethorphan-Guaifenesin) .... Take 1 Tablet By Mouth Two Times A Day As Needed For Sinus Congestion 8)  Excedrin Extra Strength 250-250-65 Mg Tabs (Aspirin-Acetaminophen-Caffeine) .... Use As Directed As Needed  Allergies (verified): 1)  ! Codeine 2)  ! * Imitrex Nasal Spray  Past History:  Past Medical History: Reviewed history from 01/25/2010 and no changes required. Hypothyroidism HTN Asthma COPD History of TIA in 2002 GERD Tobacco abuse Barretts Esophagus Hiatal Hernia Gastric Ulcer/Stricture Hyperlipidemia  Past Surgical History: Reviewed history from 12/11/2009 and no changes required. Gallbladder surgery 1996 Myomectomy x2 Hysteroscopy  Family History: Reviewed history from 01/25/2010 and no changes required. Mom - 22 yr old, COPD very active Dad - Died at 59 yr old, COPD, 5 heart attacks, first attack in 86's. Only daughter No FH of Colon Cancer:  Social History: Reviewed history from 12/11/2009 and no changes required. 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 it seems like she may have reduced the use. She is on a twice daily PPI and will continue. There is no need to re\re scope at this point as she continues salicylates and seems to be stable overall. At some point when she hopefully stops salicylates would anticipate an EGD to document healing.  Problem # 2:  LONG-TERM USE NON-STEROIDAL ANTI-INFLAMMATORIES (ICD-V58.64) Assessment: Unchanged  she is aware that this is a problem , attempts to change medication to treat headaches have been unsuccessful for reasons as outlined in the history above. She has an appointment with a new primary care physician soon and she will try to get me the name of that physician so I can communicate directly and emphasize the importance of treatment of her headaches without salicylates. I also explained that there is an addictive component to this beyond the headaches, most likely.  Problem # 3:  MIGRAINE, COMMON (ICD-346.10) Assessment: Unchanged  she has had problems with sumitripttan in the past , when used in the nasal spray form, causing tachycardia. Apparently she took the injections without problems. she was unable to get the topiramate and sumitriptan through the health department pharmacy. she will follow with primary care to see what else might be possible. it is imperative to reduce the risk of problems from ulcer disease again in the future that she come off salicylates which she continues to use to treat her headaches.  Patient Instructions: 1)  Stay on low residue diet 2)  call back with the name of your doctor you will see for your headaches. 3)  Please schedule follow-up appointment in 3-4 months with Dr. Leone Payor. 4)  The medication list was reviewed and reconciled.  All changed / newly prescribed medications were explained.  A complete medication list was provided to the patient / caregiver.

## 2010-06-07 ENCOUNTER — Encounter: Payer: Self-pay | Admitting: Internal Medicine

## 2010-06-12 ENCOUNTER — Ambulatory Visit (HOSPITAL_COMMUNITY)
Admission: RE | Admit: 2010-06-12 | Discharge: 2010-06-12 | Disposition: A | Discharge status: Home / Self Care | Payer: Self-pay | Source: Ambulatory Visit | Attending: Cardiovascular Disease | Admitting: Cardiovascular Disease

## 2010-06-12 ENCOUNTER — Ambulatory Visit (INDEPENDENT_AMBULATORY_CARE_PROVIDER_SITE_OTHER): Payer: Self-pay | Admitting: Internal Medicine

## 2010-06-12 ENCOUNTER — Encounter: Payer: Self-pay | Admitting: Internal Medicine

## 2010-06-12 DIAGNOSIS — K219 Gastro-esophageal reflux disease without esophagitis: Secondary | ICD-10-CM

## 2010-06-12 DIAGNOSIS — R000 Tachycardia, unspecified: Secondary | ICD-10-CM

## 2010-06-12 DIAGNOSIS — E785 Hyperlipidemia, unspecified: Secondary | ICD-10-CM

## 2010-06-12 DIAGNOSIS — J449 Chronic obstructive pulmonary disease, unspecified: Secondary | ICD-10-CM

## 2010-06-12 DIAGNOSIS — R079 Chest pain, unspecified: Secondary | ICD-10-CM | POA: Insufficient documentation

## 2010-06-12 DIAGNOSIS — F17200 Nicotine dependence, unspecified, uncomplicated: Secondary | ICD-10-CM

## 2010-06-12 DIAGNOSIS — G459 Transient cerebral ischemic attack, unspecified: Secondary | ICD-10-CM

## 2010-06-12 DIAGNOSIS — E039 Hypothyroidism, unspecified: Secondary | ICD-10-CM

## 2010-06-12 DIAGNOSIS — I10 Essential (primary) hypertension: Secondary | ICD-10-CM

## 2010-06-12 LAB — COMPREHENSIVE METABOLIC PANEL
Albumin: 4.3 g/dL (ref 3.5–5.2)
Alkaline Phosphatase: 80 U/L (ref 39–117)
CO2: 28 mEq/L (ref 19–32)
Creat: 0.71 mg/dL (ref 0.40–1.20)
Potassium: 3.8 mEq/L (ref 3.5–5.3)
Total Bilirubin: 0.3 mg/dL (ref 0.3–1.2)

## 2010-06-12 LAB — CBC WITH DIFFERENTIAL/PLATELET
Basophils Relative: 1 % (ref 0–1)
Eosinophils Relative: 4 % (ref 0–5)
Lymphocytes Relative: 31 % (ref 12–46)
MCH: 26.3 pg (ref 26.0–34.0)
MCHC: 30.6 g/dL (ref 30.0–36.0)
MCV: 86 fL (ref 78.0–100.0)
Monocytes Relative: 7 % (ref 3–12)
Neutro Abs: 4.5 10*3/uL (ref 1.7–7.7)
WBC: 7.9 10*3/uL (ref 4.0–10.5)

## 2010-06-12 LAB — LIPID PANEL
Total CHOL/HDL Ratio: 3.8 Ratio
VLDL: 24 mg/dL (ref 0–40)

## 2010-06-12 MED ORDER — LEVOTHYROXINE SODIUM 175 MCG PO TABS: 175.0000 ug | ORAL_TABLET | Freq: Every day | ORAL | Status: DC

## 2010-06-12 MED ORDER — METOPROLOL TARTRATE 25 MG PO TABS: 25.0000 mg | ORAL_TABLET | Freq: Two times a day (BID) | ORAL | Status: DC

## 2010-06-12 MED ORDER — IPRATROPIUM-ALBUTEROL 18-103 MCG/ACT IN AERO: 2.0000 | INHALATION_SPRAY | Freq: Three times a day (TID) | RESPIRATORY_TRACT | Status: DC

## 2010-06-12 MED ORDER — PRAVASTATIN SODIUM 40 MG PO TABS: 40.0000 mg | ORAL_TABLET | Freq: Every day | ORAL | Status: DC

## 2010-06-12 MED ORDER — LISINOPRIL-HYDROCHLOROTHIAZIDE 10-12.5 MG PO TABS: 1.0000 | ORAL_TABLET | Freq: Every day | ORAL | Status: DC

## 2010-06-12 MED ORDER — OMEPRAZOLE 40 MG PO CPDR: 40.0000 mg | DELAYED_RELEASE_CAPSULE | Freq: Two times a day (BID) | ORAL | Status: DC

## 2010-06-12 NOTE — Assessment & Plan Note (Addendum)
Her history is of intermediate concern for angina, and she has a resting tachycardia present on the last several visits. The latter obviously has a broad differentail diagnosis, but myocardial ischemia would be one of those concerns.  She has a number of risk factors (family history, smoker, dyslipidemia, HTN) and given this I think she needs a cardiac evaluation. Has an appointment 06/19/10.  Advised to take an enteric coated ASA 81 mg a day, try to quit smoking (she'll possibly consider) and f/u to ER ASAP for any pain at rest or for any sudden change/acceleration in her symptoms.  Begin metoprolol 12.5 mg bid (her pressue somewhat low)

## 2010-06-12 NOTE — Assessment & Plan Note (Signed)
Recheck direct LDL and Lipid Profile today along with CMET given her statin therapy

## 2010-06-12 NOTE — Assessment & Plan Note (Signed)
Counseled on risks-she'll think it over  We discussed this in terms of goals, including stroke-free survival odds and her goals for herself and her health

## 2010-06-12 NOTE — Assessment & Plan Note (Signed)
Good control, although I would be concerned about her tachycardia as well  I would like to begin low-dose metoprolol (25 mg 1/2 bid) given that and her uncertain status with regard to chest pain.  (See below)

## 2010-06-12 NOTE — Assessment & Plan Note (Signed)
Variability in TSH and T4 over the last year, and in light of her recent chest pain and tachycardia, would recheck a TSH today  No change in medication pending that result

## 2010-06-12 NOTE — Assessment & Plan Note (Addendum)
Checking CBC, TSH, and evaluating for cardiac etiology as noted. Pulse oximetry is 97%  Will need further evaluation after cardiac evaluation and labs as warrented by these steps

## 2010-06-12 NOTE — Progress Notes (Signed)
  Subjective:    Patient ID: Grace [name], female    DOB: 1955/12/21, 55 y.o.   MRN: [id number]  HPI  Grace [name] is here for routine visit. We began by reviewing a number of her supplements (8 in all) for sinus, bronchial and lung health-all were homeopathic.  A NINTH supplement-her cigarettes-unfortunately still at full strength at 1/2 PPD. She was willing to review some of the risks, discuss goals of health and healthcaer some, but is clearly not motivated to quit at this time.  She mentions her "heartburn"-onset of sharp, deep chest pain with exertion such as vacuming, walking on an incline, some stressful sitting activities (if under time pressure and shuffling, stacking and moving papers)-pain occurs perhaps 50-60% of the time with such activities. No rest pain, recent (last 8 weeks) change or acceleration in pattern, no associated N/V/diaphoresis/radiation. The pain resolves promptly if she stops activity. No clear pattern with meals She has had a number of episodes of tachycardia on initial vital signs in the last several months-says she frequently smokes on her way to the clinic ( "what can I say? I get a little wound up...")  Review of Systems  Constitutional: Negative for chills, diaphoresis, activity change, appetite change, fatigue and unexpected weight change.  Eyes: Negative for visual disturbance.  Respiratory: Negative for shortness of breath and wheezing.   Cardiovascular: Negative for palpitations and leg swelling.       [No sensation of pulse irregularity Gastrointestinal: Negative for abdominal distention.  Genitourinary: Negative for dysuria.  Musculoskeletal: Negative for back pain.  Neurological: Negative for dizziness, tremors and light-headedness.  Psychiatric/Behavioral: Negative for dysphoric mood.       Objective:   Physical Exam  Constitutional: She is oriented to person, place, and time. She appears well-developed and well-nourished. She appears  distressed.  HENT:  Head: Normocephalic and atraumatic.  Mouth/Throat: No oropharyngeal exudate.  Eyes: Conjunctivae and EOM are normal. Pupils are equal, round, and reactive to light.  Neck: No JVD present. No thyromegaly present.  Cardiovascular: Regular rhythm and normal heart sounds.  Exam reveals no gallop and no friction rub.   No murmur heard.      Initial rate on my exam 102. Later, 75  Pulmonary/Chest: Effort normal and breath sounds normal. She has no wheezes. She has no rales. She exhibits no tenderness.  Musculoskeletal: She exhibits no edema.  Lymphadenopathy:    She has no cervical adenopathy.  Neurological: She is oriented to person, place, and time.  Skin: Skin is warm.          Assessment & Plan:

## 2010-06-17 ENCOUNTER — Telehealth: Payer: Self-pay | Admitting: Internal Medicine

## 2010-06-19 ENCOUNTER — Encounter: Payer: Self-pay | Admitting: Cardiology

## 2010-06-19 ENCOUNTER — Ambulatory Visit (INDEPENDENT_AMBULATORY_CARE_PROVIDER_SITE_OTHER): Payer: Self-pay | Admitting: Cardiology

## 2010-06-19 DIAGNOSIS — F17200 Nicotine dependence, unspecified, uncomplicated: Secondary | ICD-10-CM

## 2010-06-19 DIAGNOSIS — R072 Precordial pain: Secondary | ICD-10-CM

## 2010-06-20 ENCOUNTER — Encounter: Payer: Self-pay | Admitting: Internal Medicine

## 2010-06-21 ENCOUNTER — Other Ambulatory Visit: Payer: Self-pay | Admitting: Internal Medicine

## 2010-06-21 DIAGNOSIS — E039 Hypothyroidism, unspecified: Secondary | ICD-10-CM

## 2010-06-21 MED ORDER — LEVOTHYROXINE SODIUM 175 MCG PO TABS: 150.0000 ug | ORAL_TABLET | Freq: Every day | ORAL | Status: DC

## 2010-06-24 ENCOUNTER — Ambulatory Visit (INDEPENDENT_AMBULATORY_CARE_PROVIDER_SITE_OTHER): Payer: Self-pay | Admitting: Internal Medicine

## 2010-06-24 ENCOUNTER — Encounter: Payer: Self-pay | Admitting: Internal Medicine

## 2010-06-24 DIAGNOSIS — E039 Hypothyroidism, unspecified: Secondary | ICD-10-CM

## 2010-06-24 DIAGNOSIS — E785 Hyperlipidemia, unspecified: Secondary | ICD-10-CM

## 2010-06-24 DIAGNOSIS — G43009 Migraine without aura, not intractable, without status migrainosus: Secondary | ICD-10-CM

## 2010-06-24 DIAGNOSIS — I10 Essential (primary) hypertension: Secondary | ICD-10-CM

## 2010-06-24 DIAGNOSIS — F17200 Nicotine dependence, unspecified, uncomplicated: Secondary | ICD-10-CM

## 2010-06-24 MED ORDER — AMITRIPTYLINE HCL 50 MG PO TABS: 50.0000 mg | ORAL_TABLET | Freq: Every day | ORAL | Status: DC

## 2010-06-24 MED ORDER — LEVOTHYROXINE SODIUM 125 MCG PO TABS: 125.0000 ug | ORAL_TABLET | Freq: Every day | ORAL | Status: DC

## 2010-06-24 NOTE — Assessment & Plan Note (Addendum)
She still takes 175 mcg daily. Her recent TSH is low at 0.178 and will decrease synthroid to 125 mcg and recheck at next visit.

## 2010-06-24 NOTE — Assessment & Plan Note (Signed)
Her migraine has been stable and has flare every 2-3 months and responds to exedrin. She said her flare is associated with life stress some, denies depression. Recent TSH low, this may also contribute to this. Will add amitriptyline and continue current metoprolol, also stop statin for now. Will see her response to these changes.

## 2010-06-24 NOTE — Patient Instructions (Signed)
Please take all your medications as instructed.  Please try to quit smoking.   Smoking Cessation This document explains the best ways for you to quit smoking and new treatments to help. It lists new medicines that can double or triple your chances of quitting and quitting for good. It also considers ways to avoid relapses and concerns you may have about quitting, including weight gain. NICOTINE: A POWERFUL ADDICTION If you have tried to quit smoking, you know how hard it can be. It is hard because nicotine is a very addictive drug. For some people, it can be as addictive as heroin or cocaine. Usually, people make 2 or 3 tries, or more, before finally being able to quit. Each time you try to quit, you can learn about what helps and what hurts. Quitting takes hard work and a lot of effort, but you can quit smoking. QUITTING SMOKING IS ONE OF THE MOST IMPORTANT THINGS YOU WILL EVER DO:  You will live longer, feel better, and live better.   The impact on your body of quitting smoking is felt almost immediately:   Within 20 minutes, blood pressure decreases. Pulse returns to its normal level.   After 8 hours, carbon monoxide levels in the blood return to normal. Oxygen level increases.   After 24 hours, chance of heart attack starts to decrease. Breath, hair, and body stop smelling like smoke.   After 48 hours, damaged nerve endings begin to recover. Sense of taste and smell improve.   After 72 hours, the body is virtually free of nicotine. Bronchial tubes relax and breathing becomes easier.   After 2 to 12 weeks, lungs can hold more air. Exercise becomes easier and circulation improves.   Quitting will lower your chance of having a heart attack, stroke, cancer, or lung disease:   After 1 year, the risk of coronary heart disease is cut in half.   After 5 years, the risk of stroke falls to the same as a nonsmoker.   After 10 years, the risk of lung cancer is cut in half and the risk of other  cancers decreases significantly.   After 15 years, the risk of coronary heart disease drops, usually to the level of a nonsmoker.   If you are pregnant, quitting smoking will improve your chances of having a healthy baby.   The people you live with, especially your children, will be healthier.   You will have extra money to spend on things other than cigarettes.  FIVE KEYS TO QUITTING Studies have shown that these 5 steps will help you quit smoking and quit for good. You have the best chances of quitting if you use them together: 1. Get ready.  2. Get support and encouragement.  3. Learn new skills and behaviors.  4. Get medicine to reduce your nicotine addiction and use it correctly.  5. Be prepared for relapse or difficult situations. Be determined to continue trying to quit, even if you do not succeed at first.  1. GET READY  Set a quit date.   Change your environment.   Get rid of ALL cigarettes, ashtrays, matches, and lighters in your home, car, and place of work.   Do not let people smoke in your home.   Review your past attempts to quit. Think about what worked and what did not.   Once you quit, do not smoke. NOT EVEN A PUFF!  2. GET SUPPORT AND ENCOURAGEMENT Studies have shown that you have a better chance of  being successful if you have help. You can get support in many ways.  Tell your family, friends, and coworkers that you are going to quit and need their support. Ask them not to smoke around you.   Talk to your caregivers (doctor, dentist, nurse, pharmacist, psychologist, and/or smoking counselor).   Get individual, group, or telephone counseling and support. The more counseling you have, the better your chances are of quitting. Programs are available at [employer] and health centers. Call your local health department for information about programs in your area.   Spiritual beliefs and practices may help some smokers quit.   Quit meters are Stage manager that keep track of quit statistics, such as amount of "quit-time," cigarettes not smoked, and money saved.   Many smokers find one or more of the many self-help books available useful in helping them quit and stay off tobacco.  3. LEARN NEW SKILLS AND BEHAVIORS  Try to distract yourself from urges to smoke. Talk to someone, go for a walk, or occupy your time with a task.   When you first try to quit, change your routine. Take a different route to work. Drink tea instead of coffee. Eat breakfast in a different place.   Do something to reduce your stress. Take a hot bath, exercise, or read a book.   Plan something enjoyable to do every day. Reward yourself for not smoking.   Explore interactive web-based programs that specialize in helping you quit.  4. GET MEDICINE AND USE IT CORRECTLY Medicines can help you stop smoking and decrease the urge to smoke. Combining medicine with the above behavioral methods and support can quadruple your chances of successfully quitting smoking. The [place] Food and Drug Administration (FDA) has approved 7 medicines to help you quit smoking. These medicines fall into 3 categories.  Nicotine replacement therapy (delivers nicotine to your body without the negative effects and risks of smoking):   Nicotine gum: Available over-the-counter.   Nicotine lozenges: Available over-the-counter.   Nicotine inhaler: Available by prescription.   Nicotine nasal spray: Available by prescription.   Nicotine skin patches (transdermal): Available by prescription and over-the-counter.   Antidepressant medicine (helps people abstain from smoking, but how this works is unknown):   Bupropion sustained-release (SR) tablets: Available by prescription.   Nicotinic receptor partial agonist (simulates the effect of nicotine in your brain):   Varenicline tartrate tablets: Available by prescription.   Ask your caregiver for advice about which  medicines to use and how to use them. Carefully read the information on the package.   Everyone who is trying to quit may benefit from using a medicine. If you are pregnant or trying to become pregnant, nursing an infant, you are under age 74, or you smoke fewer than 10 cigarettes per day, talk to your caregiver before taking any nicotine replacement medicines.   You should stop using a nicotine replacement product and call your caregiver if you experience nausea, dizziness, weakness, vomiting, fast or irregular heartbeat, mouth problems with the lozenge or gum, or redness or swelling of the skin around the patch that does not go away.   Do not use any other product containing nicotine while using a nicotine replacement product.   Talk to your caregiver before using these products if you have diabetes, heart disease, asthma, stomach ulcers, you had a recent heart attack, you have high blood pressure that is not controlled with medicine, a history of irregular heartbeat, or you have  been prescribed medicine to help you quit smoking.  5. BE PREPARED FOR RELAPSE OR DIFFICULT SITUATIONS  Most relapses occur within the first 3 months after quitting. Do not be discouraged if you start smoking again. Remember, most people try several times before they finally quit.   You may have symptoms of withdrawal because your body is used to nicotine. You may crave cigarettes, be irritable, feel very hungry, cough often, get headaches, or have difficulty concentrating.   The withdrawal symptoms are only temporary. They are strongest when you first quit, but they will go away within 10 to 14 days.  Here are some difficult situations to watch for:  Alcohol. Avoid drinking alcohol. Drinking lowers your chances of successfully quitting.   Caffeine. Try to reduce the amount of caffeine you consume. It also lowers your chances of successfully quitting.   Other smokers. Being around smoking can make you want to smoke.  Avoid smokers.   Weight gain. Many smokers will gain weight when they quit, usually less than 10 pounds. Eat a healthy diet and stay active. Do not let weight gain distract you from your main goal, quitting smoking. Some medicines that help you quit smoking may also help delay weight gain. You can always lose the weight gained after you quit.   Bad mood or depression. There are a lot of ways to improve your mood other than smoking.  If you are having problems with any of these situations, talk to your caregiver. SPECIAL SITUATIONS OR CONDITIONS Studies suggest that everyone can quit smoking. Your situation or condition can give you a special reason to quit.  Pregnant women/New mothers: By quitting, you protect your baby's health and your own.   Hospitalized patients: By quitting, you reduce health problems and help healing.   Heart attack patients: By quitting, you reduce your risk of a second heart attack.   Lung, head, and neck cancer patients: By quitting, you reduce your chance of a second cancer.   Parents of children and adolescents: By quitting, you protect your children from illnesses caused by secondhand smoke.  QUESTIONS TO THINK ABOUT Think about the following questions before you try to stop smoking. You may want to talk about your answers with your caregiver.  Why do you want to quit?   If you tried to quit in the past, what helped and what did not?   What will be the most difficult situations for you after you quit? How will you plan to handle them?   Who can help you through the tough times? Your family? Friends? Caregiver?   What pleasures do you get from smoking? What ways can you still get pleasure if you quit?  Here are some questions to ask your caregiver:  How can you help me to be successful at quitting?   What medicine do you think would be best for me and how should I take it?   What should I do if I need more help?   What is smoking withdrawal like? How  can I get information on withdrawal?  Quitting takes hard work and a lot of effort, but you can quit smoking. FOR MORE INFORMATION Smokefree.gov (http://www.davis-sullivan.com/) provides free, accurate, evidence-based information and professional assistance to help support the immediate and long-term needs of people trying to quit smoking. Document Released: 03/25/2001 Document Re-Released: 09/18/2009 [facility] Patient Information 2011 [employer], [place].

## 2010-06-24 NOTE — Assessment & Plan Note (Signed)
Lab Results  Component Value Date   NA 142 06/12/2010   K 3.8 06/12/2010   CL 101 06/12/2010   CO2 28 06/12/2010   BUN 12 06/12/2010   CREATININE 0.71 06/12/2010   CREATININE 0.82 06/26/2009    BP Readings from Last 3 Encounters:  06/24/10 126/84  06/12/10 119/80  05/31/10 104/68   BP well controlled and will continue current dose of her meds.

## 2010-06-24 NOTE — Assessment & Plan Note (Signed)
Provided smoking cessation counseling and also gives her education material. She would like to cut first.

## 2010-06-24 NOTE — Assessment & Plan Note (Signed)
Since statin may cause her migraine and her LDL at goal for now. So will hold statin for 6 months for now and will recheck FLP then if needs to restart.

## 2010-06-24 NOTE — Progress Notes (Signed)
  Subjective:    Patient ID: Grace [name], female    DOB: 09/15/1955, 55 y.o.   MRN: [id number]  HPI Pt is a 55 yo female with PMH of migraine, HTN and HLD who came here for HA. She has HA due to migraine since she was 3-4 yo. She has been having HA almost every day and most time 6/10 and has flare about every 2-3 months, also associated with photophobia and nausea on flare. Stress make her HA worse. She has no other c/o, including CP, vision change, weight change, melena. Current smoker.     Review of Systems Per HPI.  Current Outpatient Prescriptions  Medication Sig Dispense Refill  . albuterol-ipratropium (COMBIVENT) 18-103 MCG/ACT inhaler Inhale 2 puffs into the lungs 3 (three) times daily.  2 Inhaler  11  . aspirin-acetaminophen-caffeine (EXCEDRIN EXTRA STRENGTH) 250-250-65 MG per tablet Take 1 tablet by mouth as directed. As needed       . chlorpheniramine-pseudoephedrine-acetaminophen (TYLENOL ALLERGY SINUS) 2-30-500 MG per tablet Take 1 tablet by mouth as needed.        Marland Kitchen dextromethorphan-guaifenesin (MUCINEX DM) 30-600 MG per 12 hr tablet Take 1 tablet by mouth 2 (two) times daily as needed. For sinus congestion       . FEVERFEW EXTRACT PO Take 2 capsules by mouth 4 (four) times daily.        Marland Kitchen levothyroxine (SYNTHROID, LEVOTHROID) 175 MCG tablet Take 1 tablet (175 mcg total) by mouth daily.  30 tablet  12  . lisinopril-hydrochlorothiazide (PRINZIDE,ZESTORETIC) 10-12.5 MG per tablet Take 1 tablet by mouth daily.  30 tablet  11  . metoprolol tartrate (LOPRESSOR) 25 MG tablet Take 25 mg by mouth 2 (two) times daily.        Marland Kitchen omeprazole (PRILOSEC) 40 MG capsule Take 1 capsule (40 mg total) by mouth 2 (two) times daily.  60 capsule  11  . pravastatin (PRAVACHOL) 40 MG tablet Take 1 tablet (40 mg total) by mouth at bedtime. For cholesterol  30 tablet  11  . DISCONTD: metoprolol tartrate (LOPRESSOR) 25 MG tablet Take 1 tablet (25 mg total) by mouth 2 (two) times daily. Take one half (12.5  mg) twice a day-once in am, once in pm  60 tablet  11    Allergies Codeine  Past Medical History  Diagnosis Date  . GERD (gastroesophageal reflux disease)   . Hyperlipidemia   . Hypertension   . Migraine   . COPD (chronic obstructive pulmonary disease)   . Thyroid disease     hypothyroidism  . Asthma   . Hiatal hernia   . Barrett's esophagus   . Reflux esophagitis     Past Surgical History  Procedure Date  . Cholecystectomy        Objective:   Physical Exam   General: Vital signs reviewed and noted. Well-developed,well-nourished,in no acute distress; alert,appropriate and cooperative throughout examination. Head: normocephalic, atraumatic. Neck: No deformities, masses, or tenderness noted. Lungs: Normal respiratory effort. Clear to auscultation BL without crackles or wheezes.  Heart: RRR. S1 and S2 normal without gallop, murmur, or rubs.  Abdomen: BS normoactive. Soft, Nondistended, non-tender.  No masses or organomegaly. Extremities: No pretibial edema.       Assessment & Plan:

## 2010-06-25 NOTE — Progress Notes (Signed)
Summary: speak to nurse  Phone Note Call from Patient Call back at [phone]   Caller: Patient Call For: Dr Leone Payor Reason for Call: Talk to Nurse, Insurance Question Summary of Call: Patient wants to speak to nurse regarding appt that was set up for her and she was not aware of it. ([facility] 3-8) Initial call taken by: Tawni Levy,  June 17, 2010 12:28 PM  Follow-up for Phone Call        Pt. was put on schedule for 06/20/10 by mistake.  ECL not needed for this patient.  Another patient was suppose to be scheduled for that date.  Epic checked and appt. cxl'd on3/8/12 and other patient was scheduled by [facility] on 06/26/10.  called patient and spoke with her mom. Pt. was at work and mom said she will have pt. call when she gets home.  Milford Cage [facility]  June 17, 2010 1:35 PM  Follow-up by: Milford Cage [facility],  June 17, 2010 1:35 PM  Additional Follow-up for Phone Call Additional follow up Details #1::        Pt. called and I advised her that she does not have a procedure scheduled for 06/20/10.  She was very understanding and I apologized to her for the misunderstanding.   Additional Follow-up by: Milford Cage [facility],  June 17, 2010 4:39 PM

## 2010-06-25 NOTE — Assessment & Plan Note (Signed)
Summary: np6/chest pain/gccn/per [facility] outpatient [phone] dr. Lacie Scotts...   Visit Type:  Initial Consult Referring Provider:  na Primary Provider:  Dr. Reche Dixon  CC:  chest pain.  History of Present Illness: The atient presents for evaluationchest pain. She has significant cardiovascular risk factors and was recently seen in the primary care office. She mentions some occasional chest discomfort and was referred here for further evaluation. She reports having had a stress test some years ago but doesn't recall when. She's not had any documented coronary disease or other heart problems in the past. She was noted at her last visit to have a heart rate of 104 and she was started on a low-dose of beta blocker. I did review the labs from that visit and her TSH is 0.178. They have been adjusting her meds over the past couple of years.  She describes some occasional dizziness. With this she will get some chest discomfort. This is a sporadic discomfort lasting seconds. She does not notice this with exertion. The most exerting thing she does is lifting file boxes. She does not describe discomfort radiating to her neck or arms. She's not sure how long this has been ongoing. She doesn't recall having this in the past. There are no associated symptoms such as nausea vomiting or diaphoresis. He is in mild-to-moderate discomfort.  Current Medications (verified): 1)  Lisinopril-Hydrochlorothiazide 10-12.5 Mg Tabs (Lisinopril-Hydrochlorothiazide) .... Take 1 Tablet By Mouth Once A Day 2)  Combivent 103-18 Mcg/act Aero (Ipratropium-Albuterol) .... 2 Puffs Three Times Daily 3)  Omeprazole 40 Mg Cpdr (Omeprazole) .... Take  Tablet  Twice A Day. 4)  Synthroid 175 Mcg Tabs (Levothyroxine Sodium) .... Take 1 Tablet By Mouth Once A Day 5)  Tylenol Allergy Sinus 2-30-500 Mg Tabs (Chlorphen-Pseudoephed-Apap) .... Take One By Mouth As Needed 6)  Pravastatin Sodium 40 Mg Tabs (Pravastatin Sodium) .... Take One Tablet By Mouth  Each Night For Cholesterol 7)  Mucinex Dm 30-600 Mg Xr12h-Tab (Dextromethorphan-Guaifenesin) .... Take 1 Tablet By Mouth Two Times A Day As Needed For Sinus Congestion 8)  Excedrin Extra Strength 250-250-65 Mg Tabs (Aspirin-Acetaminophen-Caffeine) .... Use As Directed As Needed 9)  Valerian Root 450 Mg Caps (Valerian) .... As Needed 10)  Pro-Biotic Blend  Caps (Probiotic Product) .... Once Daily 11)  Coenzyme Q10 100 Mg Caps (Coenzyme Q10) .... Once Daily 12)  Feverfew 380 Mg Caps (Feverfew) .... Take 2 Capsules Four Times Daily 13)  Potassium Gluconate 595 Mg Tabs (Potassium Gluconate) .... Take One Tablet Once Daily 14)  Metoprolol Tartrate 25 Mg Tabs (Metoprolol Tartrate) .... 1/2 Tablet Two Times A Day  Allergies (verified): 1)  ! Codeine 2)  ! * Imitrex Nasal Spray  Past History:  Past Medical History: Reviewed history from 06/18/2010 and no changes required. Chest Pain HYPERLIPIDEMIA  Hx of TRANSIENT ISCHEMIC ATTACK  in 2002 ESSENTIAL HYPERTENSION OTHER DISEASES OF NASAL CAVITY AND SINUSES  MIGRAINE, COMMON  LONG-TERM USE NON-STEROIDAL ANTI-INFLAMMATORIES  PERSONAL HISTORY OF FAILED MODERATE SEDATION  RECTAL BLEEDING NEVUS CHRON DUOD ULCER W/O MENTION HEMORR/PERF W/OBST UNSPECIFIED TACHYCARDIA  COPD  TOBACCO ABUSE  ASTHMA GERD  HYPOTHYROIDISM  HIATAL HERNIA REFLUX ESOPHAGITIS  BARRETTS ESOPHAGUS   Past Surgical History: Reviewed history from 12/11/2009 and no changes required. Gallbladder surgery 1996 Myomectomy x2 Hysteroscopy  Family History: Reviewed history from 01/25/2010 and no changes required. Mom - 60 yr old, COPD very active Dad - Died at 13 yr old, COPD, 5 heart attacks, first attack in 43's. Only daughter No FH of  Colon Cancer:  Social History: Used to work as Conservation officer, nature at [employer]. Quit job 2009 secondary to chronic back pain. 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 Negative for all other systems except as stated in the history of present illness.  Vital Signs:  Patient profile:   55 year old female Height:      63.5 inches Weight:      186 pounds BMI:     32.55 Pulse rate:   86 / minute Pulse rhythm:   regular BP sitting:   125 / 83  (left arm) Cuff size:   large  Vitals Entered By: Judithe Modest CMA (June 19, 2010 2:59 PM)  Physical Exam  General:  Well developed, well nourished, in no acute distress. Head:  normocephalic and atraumatic Eyes:  PERRLA/EOM intact; conjunctiva and lids normal. Mouth:  Teeth, gums and palate normal. Oral mucosa normal. Neck:  Neck supple, no JVD. No masses, thyromegaly or abnormal cervical nodes. Chest Wall:  no deformities or breast masses noted Lungs:  Clear bilaterally to auscultation and percussion. Abdomen:  Bowel sounds positive; abdomen soft and non-tender without masses, organomegaly, or hernias noted. No hepatosplenomegaly. Msk:  Back normal, normal gait. Muscle strength and tone normal. Extremities:  No clubbing or cyanosis. Neurologic:  Alert and oriented x 3. Skin:  Intact without lesions or rashes. Cervical Nodes:  no significant adenopathy Inguinal Nodes:  no significant adenopathy Psych:  Normal affect.   Detailed Cardiovascular Exam  Neck    Carotids: Carotids full and equal bilaterally without bruits.      Neck Veins: Normal, no JVD.    Heart    Inspection: no deformities or lifts noted.      Palpation: normal PMI with no thrills palpable.      Auscultation: regular rate and rhythm, S1, S2 without murmurs, rubs, gallops, or clicks.    Vascular    Abdominal Aorta: no palpable masses, pulsations, or audible bruits.      Femoral Pulses: normal femoral pulses bilaterally.      Pedal Pulses: normal pedal pulses bilaterally.      Radial Pulses: normal  radial pulses bilaterally.      Peripheral Circulation: no clubbing, cyanosis, or edema noted with normal capillary refill.     EKG  Procedure date:  06/19/2010  Findings:      Sinus rhythm, rate 86, axis within normal limits, intervals within normal limits, no acute ST-T wave changes  Impression & Recommendations:  Problem # 1:  CHEST PAIN (ICD-786.50) Though her chest pain is somewhat atypical she has significant cardiovascular risk factors. She will need an exercise treadmill test. Orders: EKG w/ Interpretation (93000) Treadmill (Treadmill)  Problem # 2:  HYPERLIPIDEMIA (ICD-272.4) I reviewed her lipids with her. Though her LDL is not less than 100 I would suggest improved diet rather than increasing meds.  Problem # 3:  ESSENTIAL HYPERTENSION (ICD-401.9) Her blood pressure is at target and she will continue the meds as listed.  Problem # 4:  TOBACCO ABUSE (ICD-305.1) We discussed the need to stop smoking.  Patient Instructions: 1)  Your physician recommends that you schedule a follow-up appointment at the time of your treadmill 2)  Your physician recommends that you continue on your current medications as directed. Please  refer to the Current Medication list given to you today. 3)  Your physician has requested that you have an exercise tolerance test.  For further information please visit [email or web address]  Please also follow instruction sheet, as given.

## 2010-07-01 ENCOUNTER — Encounter: Payer: Self-pay | Admitting: Physician Assistant

## 2010-07-11 NOTE — Miscellaneous (Signed)
Summary: Lexiscan order  Clinical Lists Changes  Orders: Added new Referral order of Nuclear Stress Test (Nuc Stress Test) - Signed

## 2010-07-11 NOTE — Progress Notes (Signed)
Pt was seen by Dr Threasa Beards on 03/12 and he has addressed her abnormal lab values at this visit.

## 2010-07-16 ENCOUNTER — Other Ambulatory Visit (HOSPITAL_COMMUNITY): Payer: Self-pay | Admitting: Radiology

## 2010-08-09 ENCOUNTER — Other Ambulatory Visit: Payer: Self-pay | Admitting: *Deleted

## 2010-08-10 MED ORDER — METOPROLOL TARTRATE 25 MG PO TABS: 25.0000 mg | ORAL_TABLET | Freq: Two times a day (BID) | ORAL | Status: DC

## 2010-08-30 NOTE — H&P (Signed)
NAME:  Grace [name], Grace [name] NO.:  [id number]   MEDICAL RECORD NO.:  [id number]                   PATIENT TYPE:   LOCATION:                                       FACILITY:  [facility]   PHYSICIAN:  Juluis Mire, M.D.                DATE OF BIRTH:  01-04-1956   DATE OF ADMISSION:  DATE OF DISCHARGE:                                HISTORY & PHYSICAL   HISTORY OF PRESENT ILLNESS:  The patient is a 55 year old nulligravida white  female who presents for hysteroscopy.   In relation to the present admission, patient was seen for routine  examination on July 26.  At that time she was having abnormal bleeding with  associated anemia.  She has had a history of uterine fibroids.  She has  undergone two prior myomectomies in the past.  Subsequent saline infusion  ultrasound did reveal recurrent uterine fibroids, largest measuring 3.2 cm.  She had several intracavitary masses that could be polyps, although fibroids  must be considered.  In view of these findings she presents now for  hysteroscopic evaluation.   ALLERGIES:  No known drug allergies.   MEDICATIONS:  1. Atenolol for management of blood pressure 25 mg b.i.d.  2. Levoxyl for management of hypothyroidism.   PAST MEDICAL HISTORY:  1. History of hypertension.  2. Hypothyroidism under active management at [facility]   PAST SURGICAL HISTORY:  She has had two prior myomectomies, one done in  1990, the next one done in 1997.  In 1997 at the time of that myomectomy it  was noted that she had extensive pelvic adhesions as well as bilateral tubal  obstruction.   FAMILY HISTORY:  Noncontributory.   SOCIAL HISTORY:  Half pack per day tobacco use.  No alcohol use.   REVIEW OF SYSTEMS:  Noncontributory.   PHYSICAL EXAMINATION:  VITAL SIGNS:  The patient is afebrile with stable  vital signs.  HEENT:  The patient is normocephalic.  Pupils are equal, round, and reactive  to light and accommodation.  Extraocular  movements are intact.  Sclerae and  conjunctivae clear.  Oropharynx clear.  NECK:  Does reveal some diffuse enlargement.  Known to have hypothyroidism.  BREAST:  No skin or nipple change.  No dominant mass, adenopathy, nipple  discharge.  LUNGS:  Clear.  CARDIOVASCULAR:  Regular rhythm and rate with no murmurs or gallops.  ABDOMEN:  Benign.  Well healed low transverse incision.  PELVIC:  Normal external genitalia.  Vaginal mucosa clear.  Cervix  unremarkable.  Uterus upper limits of normal size, irregular, consistent  with recurrent uterine fibroids.  Adnexa unremarkable.  Rectovaginal  examination is clear.  EXTREMITIES:  Trace edema.  NEUROLOGIC:  Grossly within normal limits.   IMPRESSION:  1. Abnormal uterine bleeding with possible endometrial polyp.  2. Recurrent fibroids.  3. Hypothyroidism.  4. Hypertension.   PLAN:  The patient will undergo hysteroscopic  evaluation with resectoscope.  Risks of surgery have been discussed including the risk of infection, risk  of hemorrhage that could require transfusion with associated risk of AIDS or  hepatitis, the risk of injury to adjacent organs including bladder, bowel,  or ureters that could require further exploratory surgery, risk of deep  venous thrombosis and pulmonary embolus.  The patient expressed  understanding of indications and risks.                                               Juluis Mire, M.D.    JSM/MEDQ  D:  03/17/2003  T:  03/17/2003  Job:  [id number]

## 2010-08-30 NOTE — Op Note (Signed)
[name], Grace [name]                         ACCOUNT NO.:  [id number]   MEDICAL RECORD NO.:  [id number]                   PATIENT TYPE:  AMB   LOCATION:  [facility]                                  FACILITY:  [facility]   PHYSICIAN:  Juluis Mire, M.D.                DATE OF BIRTH:  [birth year]   DATE OF PROCEDURE:  03/17/2003  DATE OF DISCHARGE:                                 OPERATIVE REPORT   PREOPERATIVE DIAGNOSIS:  Abnormal uterine bleeding with possible endometrial  polyp versus submucosal fibroids.   POSTOPERATIVE DIAGNOSIS:  Abnormal uterine bleeding with possible  endometrial polyp versus submucosal fibroids, with finding of endometrial  polyps and a submucosal fibroid.   OPERATIVE PROCEDURE:  Hysteroscopy, resection of endometrial polyps, as well  as resection of endometrial fibroid.   SURGEON:  Juluis Mire, M.D.   ANESTHESIA:  General.   ESTIMATED BLOOD LOSS:  100 mL.   PACKS AND DRAINS:  None.   INTRAOPERATIVE BLOOD REPLACED:  None.   COMPLICATIONS:  None.   INDICATIONS:  Dictated in the history and physical.   DESCRIPTION OF PROCEDURE:  The patient was taken to the OR and placed in the  supine position.  After a satisfactory level of general anesthesia obtained,  the patient was placed in a dorsal lithotomy position in the [place] stirrups.  The perineum and vagina were prepped out with Betadine, draped appropriately  for hysteroscopy.  A speculum was placed in the vaginal vault.  The cervix  was grasped with a single-tooth tenaculum.  The uterus sounded to 10 cm.  The cervix was serially dilated to a size 35 Pratt dilator.  The operative  hysteroscope was introduced.  The intrauterine cavity was distended using  sorbitol.  Multiple polyps were noted and resected and sent for pathologic  review.  She had one pedunculated submucosal fibroid noted.  This was  resected in pieces and sent for pathologic review.  At the end of the  procedure the endometrial cavity was  relatively clear at this point.  Both  tubal ostia were easily identified.  Total deficit was 200 mL from the  hysteroscopy.  At this point in time we did endometrial curettings.  There  was no sign of complication.  She had no active bleeding at the present  time.  There  was no sign of perforation.  The patient taken out of the dorsal lithotomy  position, once alert and extubated transferred to the recovery room in good  condition.  Sponge, instrument, and needle count reported as correct by the  circulating nurse.                                               Juluis Mire, M.D.    JSM/MEDQ  D:  03/17/2003  T:  03/17/2003  Job:  [id number]

## 2010-09-19 ENCOUNTER — Encounter: Payer: Self-pay | Admitting: Internal Medicine

## 2010-10-27 ENCOUNTER — Encounter: Payer: Self-pay | Admitting: Internal Medicine

## 2010-11-05 ENCOUNTER — Other Ambulatory Visit: Payer: Self-pay | Admitting: *Deleted

## 2010-11-05 DIAGNOSIS — E039 Hypothyroidism, unspecified: Secondary | ICD-10-CM

## 2010-11-05 MED ORDER — LISINOPRIL-HYDROCHLOROTHIAZIDE 10-12.5 MG PO TABS: 1.0000 | ORAL_TABLET | Freq: Every day | ORAL | Status: DC

## 2010-11-05 MED ORDER — LEVOTHYROXINE SODIUM 125 MCG PO TABS: 125.0000 ug | ORAL_TABLET | Freq: Every day | ORAL | Status: DC

## 2010-11-05 NOTE — Telephone Encounter (Signed)
She needs to come in for a clinic visit, at her last visit in 06/2010 her synthroid was adjusted and the levels need to be monitored. Please make appt within next 1-2 months.  Johnette Abraham, D.O.

## 2010-11-12 NOTE — Telephone Encounter (Signed)
Message sent to schedulers to schedule pt with an appointment.

## 2010-12-05 ENCOUNTER — Encounter: Payer: Self-pay | Admitting: Internal Medicine

## 2010-12-05 ENCOUNTER — Ambulatory Visit (INDEPENDENT_AMBULATORY_CARE_PROVIDER_SITE_OTHER): Payer: Self-pay | Admitting: Internal Medicine

## 2010-12-05 DIAGNOSIS — E785 Hyperlipidemia, unspecified: Secondary | ICD-10-CM

## 2010-12-05 DIAGNOSIS — E039 Hypothyroidism, unspecified: Secondary | ICD-10-CM

## 2010-12-05 DIAGNOSIS — J449 Chronic obstructive pulmonary disease, unspecified: Secondary | ICD-10-CM

## 2010-12-05 DIAGNOSIS — I10 Essential (primary) hypertension: Secondary | ICD-10-CM

## 2010-12-05 LAB — COMPREHENSIVE METABOLIC PANEL
AST: 16 U/L (ref 0–37)
Albumin: 4.2 g/dL (ref 3.5–5.2)
BUN: 18 mg/dL (ref 6–23)
CO2: 30 mEq/L (ref 19–32)
Calcium: 10 mg/dL (ref 8.4–10.5)
Chloride: 102 mEq/L (ref 96–112)
Glucose, Bld: 95 mg/dL (ref 70–99)
Potassium: 4.7 mEq/L (ref 3.5–5.3)

## 2010-12-05 LAB — LIPID PANEL: Cholesterol: 202 mg/dL — ABNORMAL HIGH (ref 0–200)

## 2010-12-05 LAB — TSH: TSH: 0.638 u[IU]/mL (ref 0.350–4.500)

## 2010-12-05 MED ORDER — LISINOPRIL-HYDROCHLOROTHIAZIDE 10-12.5 MG PO TABS: 1.0000 | ORAL_TABLET | Freq: Every day | ORAL | Status: DC

## 2010-12-05 MED ORDER — LEVOTHYROXINE SODIUM 125 MCG PO TABS: 125.0000 ug | ORAL_TABLET | Freq: Every day | ORAL | Status: DC

## 2010-12-05 MED ORDER — OMEPRAZOLE 40 MG PO CPDR: 40.0000 mg | DELAYED_RELEASE_CAPSULE | Freq: Two times a day (BID) | ORAL | Status: DC

## 2010-12-05 MED ORDER — IPRATROPIUM-ALBUTEROL 18-103 MCG/ACT IN AERO: 2.0000 | INHALATION_SPRAY | Freq: Three times a day (TID) | RESPIRATORY_TRACT | Status: DC

## 2010-12-06 ENCOUNTER — Encounter: Payer: Self-pay | Admitting: Internal Medicine

## 2010-12-06 NOTE — Assessment & Plan Note (Signed)
Well-controlled on current medication regimen. Will provide refill today. We will also check electrolyte panel today.

## 2010-12-06 NOTE — Assessment & Plan Note (Signed)
Will check cholesterol panel today and will initiate medication if indicated. Diet and exercise recommendations discussed with patient.

## 2010-12-06 NOTE — Assessment & Plan Note (Signed)
Appears well-controlled on current medication regimen. We'll check TSH today and we'll provide refill.

## 2010-12-06 NOTE — Progress Notes (Signed)
  Subjective:    Patient ID: Grace [name], female    DOB: 12-17-1955, 55 y.o.   MRN: [id number]  HPI  Patient is 55 year old female with past medical history outlined below who presents to clinic for regular followup of blood pressure, cholesterol, thyroid. She denies recent significant hospitalizations, no episodes of chest pain or shortness of breath, no abdominal or urinary concerns. She reports compliance with recommended exercise and diet as well as medications.   Review of Systems Constitutional: Denies fever, chills, diaphoresis, appetite change and fatigue.  HEENT: Denies photophobia, eye pain, redness, hearing loss, ear pain, congestion, sore throat, rhinorrhea, sneezing, mouth sores, trouble swallowing, neck pain, neck stiffness and tinnitus.   Respiratory: Denies SOB, DOE, cough, chest tightness,  and wheezing.   Cardiovascular: Denies chest pain, palpitations and leg swelling.  Gastrointestinal: Denies nausea, vomiting, abdominal pain, diarrhea, constipation, blood in stool and abdominal distention.  Genitourinary: Denies dysuria, urgency, frequency, hematuria, flank pain and difficulty urinating.  Musculoskeletal: Denies myalgias, back pain, joint swelling, arthralgias and gait problem.  Skin: Denies pallor, rash and wound.  Neurological: Denies dizziness, seizures, syncope, weakness, light-headedness, numbness and headaches.  Hematological: Denies adenopathy. Easy bruising, personal or family bleeding history  Psychiatric/Behavioral: Denies suicidal ideation, mood changes, confusion, nervousness, sleep disturbance and agitation      Objective:   Physical Exam Constitutional: Vital signs reviewed.  Patient is a well-developed and well-nourished  in no acute distress and cooperative with exam. Alert and oriented x3.  Neck: Supple, Trachea midline normal ROM, No JVD, mass, thyromegaly, or carotid bruit present.  Cardiovascular: RRR, S1 normal, S2 normal, no MRG, pulses  symmetric and intact bilaterally Pulmonary/Chest: CTAB, no wheezes, rales, or rhonchi Abdominal: Soft. Non-tender, non-distended, bowel sounds are normal, no masses, organomegaly, or guarding present.  Musculoskeletal: No joint deformities, erythema, or stiffness, ROM full and no nontender Psychiatric: Normal mood and affect. speech and behavior is normal. Judgment and thought content normal. Cognition and memory are normal.          Assessment & Plan:

## 2011-01-03 ENCOUNTER — Ambulatory Visit: Payer: Self-pay

## 2011-01-09 ENCOUNTER — Other Ambulatory Visit: Payer: Self-pay | Admitting: Gastroenterology

## 2011-03-13 ENCOUNTER — Other Ambulatory Visit: Payer: Self-pay | Admitting: *Deleted

## 2011-03-13 ENCOUNTER — Other Ambulatory Visit: Payer: Self-pay | Admitting: Internal Medicine

## 2011-03-13 DIAGNOSIS — K219 Gastro-esophageal reflux disease without esophagitis: Secondary | ICD-10-CM

## 2011-03-13 MED ORDER — ESOMEPRAZOLE MAGNESIUM 20 MG PO CPDR: 20.0000 mg | DELAYED_RELEASE_CAPSULE | Freq: Every day | ORAL | Status: DC

## 2011-03-13 NOTE — Assessment & Plan Note (Signed)
Change omeprazole to nexium, so as to allow coverage per [facility].

## 2011-03-13 NOTE — Telephone Encounter (Signed)
Also [facility] can get nexium at no charge.  Will you change omeprazole ?

## 2011-03-13 NOTE — Telephone Encounter (Signed)
Refill request is for Relpax 20 mg  Take as needed per [facility].  I can't find on med list but was written 05/02/10 Will you refill?

## 2011-03-14 NOTE — Progress Notes (Signed)
rx faxed in, pharmacy informed about Relpax

## 2011-04-10 ENCOUNTER — Telehealth: Payer: Self-pay | Admitting: *Deleted

## 2011-04-10 NOTE — Telephone Encounter (Signed)
Schedule her an appointment for evaluation and consideration. She may need PFTs ordered.

## 2011-04-10 NOTE — Telephone Encounter (Signed)
Received fax from [facility] pharmacy - stated pt has used more than a year's supply of Combivent inhaler since 2/12. Wants to know if pt would benefit from the addition of a steroid inhaler? Thanks

## 2011-04-16 NOTE — Telephone Encounter (Signed)
Appt scheduled 04/25/11.

## 2011-04-18 ENCOUNTER — Telehealth: Payer: Self-pay | Admitting: *Deleted

## 2011-04-18 NOTE — Telephone Encounter (Signed)
Fax from [facility] pt has used more than a year's supply since 2/12.  Would pt benefit from the addition of a Steriod inhaler?  Angelina Ok, RN 04/18/2011 3:00 PM

## 2011-04-18 NOTE — Telephone Encounter (Signed)
As Dr. Phillips Odor said at the end of December, she needs to come in for a clinic appt. I am not going to start a new medication for her without evaluating the pt.   Please get her scheduled if she does not already have an appt.  Thank you!  Grace [name], D.O.

## 2011-04-25 ENCOUNTER — Encounter: Payer: Self-pay | Admitting: Internal Medicine

## 2011-04-25 ENCOUNTER — Ambulatory Visit (INDEPENDENT_AMBULATORY_CARE_PROVIDER_SITE_OTHER): Payer: Self-pay | Admitting: Internal Medicine

## 2011-04-25 ENCOUNTER — Telehealth: Payer: Self-pay | Admitting: *Deleted

## 2011-04-25 VITALS — BP 156/96 | HR 92 | Temp 97.0°F | Ht 63.0 in | Wt 193.1 lb

## 2011-04-25 DIAGNOSIS — J4489 Other specified chronic obstructive pulmonary disease: Secondary | ICD-10-CM

## 2011-04-25 DIAGNOSIS — J45909 Unspecified asthma, uncomplicated: Secondary | ICD-10-CM

## 2011-04-25 DIAGNOSIS — J449 Chronic obstructive pulmonary disease, unspecified: Secondary | ICD-10-CM

## 2011-04-25 DIAGNOSIS — Z8673 Personal history of transient ischemic attack (TIA), and cerebral infarction without residual deficits: Secondary | ICD-10-CM

## 2011-04-25 DIAGNOSIS — G43009 Migraine without aura, not intractable, without status migrainosus: Secondary | ICD-10-CM

## 2011-04-25 DIAGNOSIS — Z23 Encounter for immunization: Secondary | ICD-10-CM

## 2011-04-25 DIAGNOSIS — E039 Hypothyroidism, unspecified: Secondary | ICD-10-CM

## 2011-04-25 DIAGNOSIS — E785 Hyperlipidemia, unspecified: Secondary | ICD-10-CM

## 2011-04-25 DIAGNOSIS — I10 Essential (primary) hypertension: Secondary | ICD-10-CM

## 2011-04-25 MED ORDER — PRAVASTATIN SODIUM 20 MG PO TABS: 20.0000 mg | ORAL_TABLET | Freq: Every day | ORAL | Status: DC

## 2011-04-25 MED ORDER — IPRATROPIUM BROMIDE HFA 17 MCG/ACT IN AERS: 2.0000 | INHALATION_SPRAY | Freq: Four times a day (QID) | RESPIRATORY_TRACT | Status: DC

## 2011-04-25 MED ORDER — IPRATROPIUM-ALBUTEROL 18-103 MCG/ACT IN AERO: 2.0000 | INHALATION_SPRAY | Freq: Three times a day (TID) | RESPIRATORY_TRACT | Status: DC

## 2011-04-25 MED ORDER — ALBUTEROL 90 MCG/ACT IN AERS: 2.0000 | INHALATION_SPRAY | Freq: Four times a day (QID) | RESPIRATORY_TRACT | Status: DC | PRN

## 2011-04-25 MED ORDER — METOPROLOL TARTRATE 25 MG PO TABS: 25.0000 mg | ORAL_TABLET | Freq: Two times a day (BID) | ORAL | Status: DC

## 2011-04-25 NOTE — Assessment & Plan Note (Signed)
TSH good last visit, continue current synthroid dose.

## 2011-04-25 NOTE — Assessment & Plan Note (Addendum)
LDL 111.  Will start on low dose statin due to history of CVA.  LFTs good last visit.

## 2011-04-25 NOTE — Telephone Encounter (Signed)
Talked with Dr Yaakov Guthrie and he approved 2 more combivent inhalers while other inhalers on order. [facility] informed

## 2011-04-25 NOTE — Telephone Encounter (Signed)
[facility] called letting us know pt has used 12 Combivent inhalers since 05/30/10.  This is a 400 day supply used in less than a year.   Do you want them to give her another one while waiting for the Atrovent and ventolin inhalers to come in?  It may take 4 - 6 weeks for the supply to get here.  They suggested using a steroid inhaler. Please advise

## 2011-04-25 NOTE — Patient Instructions (Signed)
Return to clinic in 4-6 weeks.

## 2011-04-29 ENCOUNTER — Encounter: Payer: Self-pay | Admitting: Internal Medicine

## 2011-04-29 NOTE — Assessment & Plan Note (Signed)
Patient gives history of CVA about 10 years ago.  Had R sided numbness at the time and some residual R sided decreased sensation R side.  Per patient, MRI at neurology follow-up proved stroke eventhough CT scan acutely was negative (all per patient report).  If this is true, this patient should not be taking a triptan.  Will discontinue her triptan today.  Try excedrine alone for migraines.

## 2011-04-29 NOTE — Assessment & Plan Note (Signed)
BP on recheck 132/88.  Continue current regimen

## 2011-04-29 NOTE — Assessment & Plan Note (Addendum)
Unsure if she has COPD or asthma due to diagnosis of asthma but age of diagnosis and smoking history make it suspicious for COPD.  Will order PFTs to differentiate. Patient is using a lot of ipratropium/albuterol inhaler.  If this is COPD, ideal regimen would be anticholinergic inhaler scheduled plus PRN albuterol.  Since she cannot get Spiriva affordably, will switch to Atrovent 4x per day scheduled and albuterol PRN.  Since this may need to be gotten through [facility] at [facility], will prescribe short supply of combivent in meanwhile since the pharmacy has some combivent inhalers waiting for her anyway.    Addendum: Patient says she had PFTs done previously at [facility] on [address] and that she cannot afford PFTs done at this time.  I called [facility] and per their records she has not had PFTs done there.  Need for PFTs should be re-addressed at her follow-up appt to take place in 5 weeks.

## 2011-04-29 NOTE — Progress Notes (Signed)
Subjective:   Patient ID: Grace [name] female   DOB: [birth year] 56 y.o.   MRN: [id number]  HPI: Grace [name] is a 56 y.o. with HLD, HTN, migraine, Asthma vs. COPD here for follow-up.  She reports that her breathing has been doing fairly well.  Mostly just some SOB when exerting herself.  Uses combivent 4x daily.    Also has been taking her blood pressure medicines regularly.  She is taking eletriptan and excedrin for her migraines.      Past Medical History  Diagnosis Date  . GERD (gastroesophageal reflux disease)   . Hyperlipidemia   . Hypertension   . Migraine   . COPD (chronic obstructive pulmonary disease)   . Thyroid disease     hypothyroidism  . Asthma   . Hiatal hernia   . Barrett's esophagus   . Reflux esophagitis    Current Outpatient Prescriptions  Medication Sig Dispense Refill  . albuterol (PROVENTIL,VENTOLIN) 90 MCG/ACT inhaler Inhale 2 puffs into the lungs every 6 (six) hours as needed for wheezing.  17 g  12  . albuterol-ipratropium (COMBIVENT) 18-103 MCG/ACT inhaler Inhale 2 puffs into the lungs 3 (three) times daily.  2 Inhaler  0  . aspirin-acetaminophen-caffeine (EXCEDRIN EXTRA STRENGTH) 250-250-65 MG per tablet Take 1 tablet by mouth as directed. As needed       . chlorpheniramine-pseudoephedrine-acetaminophen (TYLENOL ALLERGY SINUS) 2-30-500 MG per tablet Take 1 tablet by mouth as needed.        Marland Kitchen dextromethorphan-guaifenesin (MUCINEX DM) 30-600 MG per 12 hr tablet Take 1 tablet by mouth 2 (two) times daily as needed. For sinus congestion       . esomeprazole (NEXIUM) 20 MG capsule Take 1 capsule (20 mg total) by mouth daily.  30 capsule  5  . FEVERFEW EXTRACT PO Take 2 capsules by mouth 4 (four) times daily.        Marland Kitchen ipratropium (ATROVENT HFA) 17 MCG/ACT inhaler Inhale 2 puffs into the lungs 4 (four) times daily.  1 Inhaler  6  . levothyroxine (SYNTHROID, LEVOTHROID) 125 MCG tablet Take 1 tablet (125 mcg total) by mouth daily.  90 tablet  11  .  lisinopril-hydrochlorothiazide (PRINZIDE,ZESTORETIC) 10-12.5 MG per tablet Take 1 tablet by mouth daily.  90 tablet  11  . metoprolol tartrate (LOPRESSOR) 25 MG tablet Take 1 tablet (25 mg total) by mouth 2 (two) times daily.  60 tablet  5  . pravastatin (PRAVACHOL) 20 MG tablet Take 1 tablet (20 mg total) by mouth daily.  30 tablet  6   Family History  Problem Relation Age of Onset  . COPD Mother   . Heart disease Father    History   Social History  . Marital Status: Divorced    Spouse Name: N/A    Number of Children: N/A  . Years of Education: N/A   Social History Main Topics  . Smoking status: Current Everyday Smoker -- 0.5 packs/day    Types: Cigarettes  . Smokeless tobacco: None  . Alcohol Use: No  . Drug Use: No  . Sexually Active: None   Other Topics Concern  . None   Social History Narrative   Financial assistance approved for 100% discount at [facility] and has [facility] card per [place]10/07/2009   Review of Systems: Constitutional: Denies fever, chills, diaphoresis, appetite change and fatigue.  Respiratory: Denies cough, chest tightness,  and wheezing.   Cardiovascular: Denies chest pain, palpitations and leg swelling.  Gastrointestinal: Denies nausea, vomiting, abdominal pain,  diarrhea, constipation, blood in stool and abdominal distention.  Skin: Denies pallor, rash and wound.  Neurological: Reports mild residual decreased sensation in R arm from CVA 10 yrs ago.  Denies dizziness, seizures, syncope, weakness, light-headedness   Objective:  Physical Exam: Filed Vitals:   04/25/11 1032  BP: 156/96  Pulse: 92  Temp: 97 F (36.1 C)  TempSrc: Oral  Height: 5\' 3"  (1.6 m)  Weight: 193 lb 1.6 oz (87.59 kg)   Constitutional: Vital signs reviewed.  Patient is a well-developed and well-nourished woman in no acute distress and cooperative with exam. Alert and oriented x3.  Head: Normocephalic and atraumatic Mouth: no erythema or exudates, MMM Eyes: PERRL, EOMI,  conjunctivae normal, No scleral icterus.  Neck: No JVD Cardiovascular: RRR, S1 normal, S2 normal, no MRG, pulses symmetric and intact bilaterally Pulmonary/Chest: CTAB, no wheezes, rales, or rhonchi  Neurological: A&O x3, Strength is normal and symmetric bilaterally, cranial nerve II-XII are grossly intact, no focal motor deficit, sensory intact to light touch bilaterally.  Skin: Warm, dry and intact. No rash, cyanosis, or clubbing.    Assessment & Plan:

## 2011-04-30 NOTE — Progress Notes (Signed)
I agree with Dr. Wainwright's assessment and plan. 

## 2011-05-09 ENCOUNTER — Encounter (HOSPITAL_COMMUNITY): Payer: Self-pay

## 2011-05-13 ENCOUNTER — Telehealth: Payer: Self-pay | Admitting: *Deleted

## 2011-05-13 NOTE — Telephone Encounter (Signed)
Pt is requesting a refill on Relpax 20 mg tablets.  # 30.  Last dispensed on 12/26/2010.  Was ordered 05/02/2010 by Dr. Comer Locket.  Was ordered because Imitrex is not available through the [facility].  Angelina Ok, RN 05/13/2011 12:12 PM.

## 2011-05-15 ENCOUNTER — Telehealth: Payer: Self-pay | Admitting: *Deleted

## 2011-05-15 NOTE — Telephone Encounter (Signed)
Please forward this to Dr. Yaakov Guthrie. He apparently saw this patient on 04/25/11 at which time he decided to stop her triptan. It is not clear per his note why this change was made. But apparently, he thought control was good off of triptan, and she was supposed to try the excedrin. Please forward this to Dr. Yaakov Guthrie.  Johnette Abraham, D.O.

## 2011-05-15 NOTE — Telephone Encounter (Signed)
Patient should not be on triptan because she gave me a distant history of stroke at her recent office visit with me, and triptans contraindicated in patients with history of stroke.  From my review of records her history of stroke was not previously known to our clinic this is why we had prescribed her triptan previously.

## 2011-05-15 NOTE — Telephone Encounter (Signed)
Call to Respiratory to see if pt had been scheduled for PFT's.  Pt was scheduled for 05/09/2011.  Pt canceled stating no insurance.   Call to pt to see if she has tried to qualify for the [employer].  Pt said that she has an [place] card that pays 90%.  Pt said she is unemployed and needs to use the $10.00 for something else.  Pt was told to call when she feels she can afford the test.  Angelina Ok, RN 05/15/2011. 4:13 PM

## 2011-06-03 ENCOUNTER — Encounter: Payer: Self-pay | Admitting: Internal Medicine

## 2011-06-12 ENCOUNTER — Ambulatory Visit (INDEPENDENT_AMBULATORY_CARE_PROVIDER_SITE_OTHER): Payer: Self-pay | Admitting: Internal Medicine

## 2011-06-12 ENCOUNTER — Encounter: Payer: Self-pay | Admitting: Internal Medicine

## 2011-06-12 VITALS — BP 137/91 | HR 89 | Temp 97.1°F | Wt 196.9 lb

## 2011-06-12 DIAGNOSIS — R079 Chest pain, unspecified: Secondary | ICD-10-CM | POA: Insufficient documentation

## 2011-06-12 DIAGNOSIS — Z720 Tobacco use: Secondary | ICD-10-CM

## 2011-06-12 DIAGNOSIS — I10 Essential (primary) hypertension: Secondary | ICD-10-CM

## 2011-06-12 DIAGNOSIS — Z87891 Personal history of nicotine dependence: Secondary | ICD-10-CM | POA: Insufficient documentation

## 2011-06-12 DIAGNOSIS — E039 Hypothyroidism, unspecified: Secondary | ICD-10-CM

## 2011-06-12 DIAGNOSIS — R0789 Other chest pain: Secondary | ICD-10-CM | POA: Insufficient documentation

## 2011-06-12 DIAGNOSIS — Z0000 Encounter for general adult medical examination without abnormal findings: Secondary | ICD-10-CM | POA: Insufficient documentation

## 2011-06-12 DIAGNOSIS — F17200 Nicotine dependence, unspecified, uncomplicated: Secondary | ICD-10-CM

## 2011-06-12 DIAGNOSIS — Z23 Encounter for immunization: Secondary | ICD-10-CM

## 2011-06-12 LAB — T4, FREE: Free T4: 1.44 ng/dL (ref 0.80–1.80)

## 2011-06-12 NOTE — Assessment & Plan Note (Signed)
Labs: Lab Results  Component Value Date   TSH 0.638 12/05/2010   FREET4 1.43 01/29/2010    Assessment: Thyroid Control: controlled, but is somewhat symptomatic today, and is having occasional chest pain, therefore, reasonable to recheck since last TSH low normal.  Barriers to meeting goals: no barriers identified   Plan:  continue current medications  Check TSH, free T4 today.

## 2011-06-12 NOTE — Assessment & Plan Note (Signed)
Health Maintenance  Topic Date Due  . Pap Smear  05/10/1973  . Mammogram  04/17/2011  . Influenza Vaccine  01/13/2012  . Colonoscopy  10/07/2016  . Tetanus/tdap  06/11/2021    Assessment:  Due for PAP, Tdap  Plan:  Tdap today.  Plan PAP next continuity visit

## 2011-06-12 NOTE — Progress Notes (Signed)
Subjective:    Patient ID: Grace [name] female   DOB: May 18, 1955 56 y.o.   MRN: [id number]  HPI: [name] is a 56 y.o. with a PMHx of HTN, HLD, ongoing tobacco abuse, who presented to clinic today for the following:   1) HTN - Patient does check blood pressure regularly at home at night periodically from 130-140/70-80 mmHg. Currently taking Metoprolol 25 mg BID, Lisinopril-HCTZ 10-12.5 mg. Patient misses doses 0 x per week on average. denies headaches, dizziness, lightheadedness, Admits to occasional exertional chest pain, shortness of breath.  does not request refills today.   2) HLD - currently taking Pravastatin 20mg  daily. Patient misses doses 0 x per week on average. Admits to occasional exertional chest pain, shortness of breath. Denies palpitations, tachycardia, and muscle pains. does not request refills today.   3) Hypothyroidism - Patient currently taking Synthroid 125 mcg.  Current symptoms: brittle nails. . Patient denies nervousness and weight changes. Symptoms have waxed and waned.   4) Chest pain - Patient is currently without pain or shortness of breath. She describes several year history of intermittent exertional, nonradiating, mid-chest pain for which she has been last seen by [facility] in 06/2010 for evaluation. At that time, was recommended exercise stress test - did not complete because she could not afford the 10% of bill payment (pt had 90% orange card). States recently she is able to [employer] without difficulty. Still smoking. Currently, she denies chest pain, diaphoresis, nausea, near syncope, palpitations and shortness of breath.  5) Tobacco abuse - patient continues to smoke 1-10 cigarettes per day and has done so for the past >25 years. There  have been prior attempts to quit - with success in 2001 when she went cold-turkey after her stroke. Barriers to quit (if any) include: none.   Review of Systems: Per HPI.   Current Outpatient  Medications: Medication Sig  . albuterol (PROVENTIL,VENTOLIN) 90 MCG/ACT inhaler Inhale 2 puffs into the lungs every 6 (six) hours as needed for wheezing.  Marland Kitchen albuterol-ipratropium (COMBIVENT) 18-103 MCG/ACT inhaler Inhale 2 puffs into the lungs 3 (three) times daily.  Marland Kitchen aspirin EC 81 MG tablet Take 81 mg by mouth daily.  Marland Kitchen aspirin-acetaminophen-caffeine (EXCEDRIN EXTRA STRENGTH) 250-250-65 MG per tablet Take 1 tablet by mouth as directed. As needed   . chlorpheniramine-pseudoephedrine-acetaminophen (TYLENOL ALLERGY SINUS) 2-30-500 MG per tablet Take 1 tablet by mouth as needed.    Marland Kitchen dextromethorphan-guaifenesin (MUCINEX DM) 30-600 MG per 12 hr tablet Take 1 tablet by mouth 2 (two) times daily as needed. For sinus congestion   . esomeprazole (NEXIUM) 20 MG capsule Take 1 capsule (20 mg total) by mouth daily.  Marland Kitchen ipratropium (ATROVENT HFA) 17 MCG/ACT inhaler Inhale 2 puffs into the lungs 4 (four) times daily.  Marland Kitchen levothyroxine (SYNTHROID, LEVOTHROID) 125 MCG tablet Take 1 tablet (125 mcg total) by mouth daily.  Marland Kitchen lisinopril-hydrochlorothiazide (PRINZIDE,ZESTORETIC) 10-12.5 MG per tablet Take 1 tablet by mouth daily.  . metoprolol tartrate (LOPRESSOR) 25 MG tablet Take 1 tablet (25 mg total) by mouth 2 (two) times daily.  . pravastatin (PRAVACHOL) 20 MG tablet Take 1 tablet (20 mg total) by mouth daily.  Marland Kitchen FEVERFEW EXTRACT PO Take 2 capsules by mouth 4 (four) times daily.      Allergies: Allergies  Allergen Reactions  . Codeine     REACTION: headaches    Past Medical History  Diagnosis Date  . GERD (gastroesophageal reflux disease)   . Hyperlipidemia   . Hypertension   .  Migraine   . COPD (chronic obstructive pulmonary disease)   . Hypothyroidism   . Asthma   . Hiatal hernia   . Barrett's esophagus   . Reflux esophagitis   . Stroke 2001    Past Surgical History  Procedure Date  . Cholecystectomy      Objective:    Physical Exam: Filed Vitals:   06/12/11 0953  BP: 137/91   Pulse: 89  Temp:       General: Vital signs reviewed and noted. Well-developed, well-nourished, in no acute distress; alert, appropriate and cooperative throughout examination.  Head: Normocephalic, atraumatic.  Eyes: conjunctivae/corneas clear. PERRL.  Ears: TM nonerythematous, not bulging, good light reflex bilaterally.  Nose: Mucous membranes moist, not inflammed, nonerythematous.  Throat: Oropharynx nonerythematous, no exudate appreciated.   Neck: No deformities, masses, or tenderness noted.  Lungs:  Normal respiratory effort. Clear to auscultation BL without crackles or wheezes.  Heart: RRR. S1 and S2 normal without gNoallop, rubs. No murmur.  Abdomen:  BS normoactive. Soft, Nondistended, non-tender.  No masses or organomegaly.  Extremities: No pretibial edema.  Neurologic: A&O X3, CN II - XII are grossly intact. Motor strength is 5/5 in the all 4 extremities, Sensations intact to light touch.      Assessment/ Plan:    Case and plan of care discussed with Dr. Margarito Liner.

## 2011-06-12 NOTE — Assessment & Plan Note (Signed)
Assessment: Intermittent exertional chest pain for several years. Previously was seen by [facility] (Dr. Antoine Poche) in 06/2010, recommended exercise stress test but did not complete and did not follow-up because she could not afford even though orange card was paying 90%. She currently is without chest pain, shortness of breath, palpitations, etc. However, has multiple cardiac risk factors including but not limited to: smoking, htn, hld, prior CVA - therefore, needs to have a stress test - refuses at this point because of finances.  Plan:  Pt to requalify for orange card on July 21, 2011 - she is instructed to follow-up with [place] immediately after getting this (she is hoping for 100% orange card since she lost her job)  Will need cardiology referral at that time for stress test - note, although previously recommended exercise stress test, states cannot participate because of chronic hip pain - therefore, may need nuclear study.  Pt recommended cardiology referral now - refuses.  Pt instructed to go to ER if CP symptoms arise.

## 2011-06-12 NOTE — Patient Instructions (Addendum)
Please follow-up at the clinic in 2 months, at which time we will reevaluate your blood pressure, cholesterol, pain, and do your PAP - OR, please follow-up in the clinic sooner if needed.  There have not been changes in your medications.   If you have chest pain, severe, go to the ER  Follow-up with [place] after you see Chauncey Reading in April so that we can do a referral to cardiology for your stress test.  Please try to stop smoking.  If you have been started on new medication(s), and you develop symptoms concerning for allergic reaction, including, but not limited to, throat closing, tongue swelling, rash, please stop the medication immediately and call the clinic at [phone], and go to the ER.  If symptoms worsen, or new symptoms arise, please call the clinic or go to the ER.  Please bring all of your medications in a bag to your next visit.   Smoking Cessation Tips 1-800-QUIT-NOW  This document explains the best ways for you to quit smoking and new treatments to help. It lists new medicines that can double or triple your chances of quitting and quitting for good. It also considers ways to avoid relapses and concerns you may have about quitting, including weight gain.  NICOTINE: A POWERFUL ADDICTION  If you have tried to quit smoking, you know how hard it can be. It is hard because nicotine is a very addictive drug. For some people, it can be as addictive as heroin or cocaine. Usually, people make 2 or 3 tries, or more, before finally being able to quit. Each time you try to quit, you can learn about what helps and what hurts. Quitting takes hard work and a lot of effort, but you can quit smoking.  QUITTING SMOKING IS ONE OF THE MOST IMPORTANT THINGS YOU WILL EVER DO:  You will live longer, feel better, and live better.  The impact on your body of quitting smoking is felt almost immediately:  Within 20 minutes, blood pressure decreases. Pulse returns to its normal level.  After 8 hours, carbon  monoxide levels in the blood return to normal. Oxygen level increases.  After 24 hours, chance of heart attack starts to decrease. Breath, hair, and body stop smelling like smoke.  After 48 hours, damaged nerve endings begin to recover. Sense of taste and smell improve.  After 72 hours, the body is virtually free of nicotine. Bronchial tubes relax and breathing becomes easier.  After 2 to 12 weeks, lungs can hold more air. Exercise becomes easier and circulation improves.  Quitting will lower your chance of having a heart attack, stroke, cancer, or lung disease:  After 1 year, the risk of coronary heart disease is cut in half.  After 5 years, the risk of stroke falls to the same as a nonsmoker.  After 10 years, the risk of lung cancer is cut in half and the risk of other cancers decreases significantly.  After 15 years, the risk of coronary heart disease drops, usually to the level of a nonsmoker.  If you are pregnant, quitting smoking will improve your chances of having a healthy baby.  The people you live with, especially your children, will be healthier.  You will have extra money to spend on things other than cigarettes.  FIVE KEYS TO QUITTING  Studies have shown that these 5 steps will help you quit smoking and quit for good. You have the best chances of quitting if you use them together:  1.  Get ready.  2. Get support and encouragement.  3. Learn new skills and behaviors.  4. Get medicine to reduce your nicotine addiction and use it correctly.  5. Be prepared for relapse or difficult situations. Be determined to continue trying to quit, even if you do not succeed at first.  1. GET READY  Set a quit date.  Change your environment.  Get rid of ALL cigarettes, ashtrays, matches, and lighters in your home, car, and place of work.  Do not let people smoke in your home.  Review your past attempts to quit. Think about what worked and what did not.  Once you quit, do not smoke. NOT EVEN A  PUFF!  2. GET SUPPORT AND ENCOURAGEMENT  Studies have shown that you have a better chance of being successful if you have help. You can get support in many ways.  Tell your family, friends, and coworkers that you are going to quit and need their support. Ask them not to smoke around you.  Talk to your caregivers (doctor, dentist, nurse, pharmacist, psychologist, and/or smoking counselor).  Get individual, group, or telephone counseling and support. The more counseling you have, the better your chances are of quitting. Programs are available at [employer] and health centers. Call your local health department for information about programs in your area.  Spiritual beliefs and practices may help some smokers quit.  Quit meters are Photographer that keep track of quit statistics, such as amount of "quit-time," cigarettes not smoked, and money saved.  Many smokers find one or more of the many self-help books available useful in helping them quit and stay off tobacco.  3. LEARN NEW SKILLS AND BEHAVIORS  Try to distract yourself from urges to smoke. Talk to someone, go for a walk, or occupy your time with a task.  When you first try to quit, change your routine. Take a different route to work. Drink tea instead of coffee. Eat breakfast in a different place.  Do something to reduce your stress. Take a hot bath, exercise, or read a book.  Plan something enjoyable to do every day. Reward yourself for not smoking.  Explore interactive web-based programs that specialize in helping you quit.  4. GET MEDICINE AND USE IT CORRECTLY  Medicines can help you stop smoking and decrease the urge to smoke. Combining medicine with the above behavioral methods and support can quadruple your chances of successfully quitting smoking.  The U.S. Food and Drug Administration (FDA) has approved 7 medicines to help you quit smoking. These medicines fall into 3 categories.  Nicotine  replacement therapy (delivers nicotine to your body without the negative effects and risks of smoking):  Nicotine gum: Available over-the-counter.  Nicotine lozenges: Available over-the-counter.  Nicotine inhaler: Available by prescription.  Nicotine nasal spray: Available by prescription.  Nicotine skin patches (transdermal): Available by prescription and over-the-counter.  Antidepressant medicine (helps people abstain from smoking, but how this works is unknown):  Bupropion sustained-release (SR) tablets: Available by prescription.  Nicotinic receptor partial agonist (simulates the effect of nicotine in your brain):  Varenicline tartrate tablets: Available by prescription.  Ask your caregiver for advice about which medicines to use and how to use them. Carefully read the information on the package.  Everyone who is trying to quit may benefit from using a medicine. If you are pregnant or trying to become pregnant, nursing an infant, you are under age 107, or you smoke fewer than 10 cigarettes per day,  talk to your caregiver before taking any nicotine replacement medicines.  You should stop using a nicotine replacement product and call your caregiver if you experience nausea, dizziness, weakness, vomiting, fast or irregular heartbeat, mouth problems with the lozenge or gum, or redness or swelling of the skin around the patch that does not go away.  Do not use any other product containing nicotine while using a nicotine replacement product.  Talk to your caregiver before using these products if you have diabetes, heart disease, asthma, stomach ulcers, you had a recent heart attack, you have high blood pressure that is not controlled with medicine, a history of irregular heartbeat, or you have been prescribed medicine to help you quit smoking.  5. BE PREPARED FOR RELAPSE OR DIFFICULT SITUATIONS  Most relapses occur within the first 3 months after quitting. Do not be discouraged if you start smoking  again. Remember, most people try several times before they finally quit.  You may have symptoms of withdrawal because your body is used to nicotine. You may crave cigarettes, be irritable, feel very hungry, cough often, get headaches, or have difficulty concentrating.  The withdrawal symptoms are only temporary. They are strongest when you first quit, but they will go away within 10 to 14 days.  Here are some difficult situations to watch for:  Alcohol. Avoid drinking alcohol. Drinking lowers your chances of successfully quitting.  Caffeine. Try to reduce the amount of caffeine you consume. It also lowers your chances of successfully quitting.  Other smokers. Being around smoking can make you want to smoke. Avoid smokers.  Weight gain. Many smokers will gain weight when they quit, usually less than 10 pounds. Eat a healthy diet and stay active. Do not let weight gain distract you from your main goal, quitting smoking. Some medicines that help you quit smoking may also help delay weight gain. You can always lose the weight gained after you quit.  Bad mood or depression. There are a lot of ways to improve your mood other than smoking.  If you are having problems with any of these situations, talk to your caregiver.  SPECIAL SITUATIONS OR CONDITIONS  Studies suggest that everyone can quit smoking. Your situation or condition can give you a special reason to quit.  Pregnant women/New mothers: By quitting, you protect your baby's health and your own.  Hospitalized patients: By quitting, you reduce health problems and help healing.  Heart attack patients: By quitting, you reduce your risk of a second heart attack.  Lung, head, and neck cancer patients: By quitting, you reduce your chance of a second cancer.  Parents of children and adolescents: By quitting, you protect your children from illnesses caused by secondhand smoke.  QUESTIONS TO THINK ABOUT  Think about the following questions before you try to  stop smoking. You may want to talk about your answers with your caregiver.  Why do you want to quit?  If you tried to quit in the past, what helped and what did not?  What will be the most difficult situations for you after you quit? How will you plan to handle them?  Who can help you through the tough times? Your family? Friends? Caregiver?  What pleasures do you get from smoking? What ways can you still get pleasure if you quit?  Here are some questions to ask your caregiver:  How can you help me to be successful at quitting?  What medicine do you think would be best for me and how should  I take it?  What should I do if I need more help?  What is smoking withdrawal like? How can I get information on withdrawal?  Quitting takes hard work and a lot of effort, but you can quit smoking.  FOR MORE INFORMATION  Smokefree.gov (http://www.davis-sullivan.com/) provides free, accurate, evidence-based information and professional assistance to help support the immediate and long-term needs of people trying to quit smoking.  Document Released: 03/25/2001 Document Re-Released: 09/18/2009  [facility] Patient Information 2011 [place], [place].

## 2011-06-12 NOTE — Assessment & Plan Note (Signed)
1. The patient was counseled on the dangers of tobacco use, and was advised to quit and referred to a tobacco cessation program.   2. Reviewed strategies to maximize success, including:  Removing cigarettes and smoking materials from environment  Stress management  Substitution of other forms of reinforcement Support of family/friends.  Selecting a quit date. 

## 2011-06-12 NOTE — Assessment & Plan Note (Signed)
BP Readings from Last 3 Encounters:  06/12/11 137/91  04/25/11 156/96  12/05/10 105/71    Basic Metabolic Panel:    Component Value Date/Time   NA 141 12/05/2010 1215   K 4.7 12/05/2010 1215   CL 102 12/05/2010 1215   CO2 30 12/05/2010 1215   BUN 18 12/05/2010 1215   CREATININE 1.00 12/05/2010 1215   CREATININE 0.82 06/26/2009 1908   GLUCOSE 95 12/05/2010 1215   CALCIUM 10.0 12/05/2010 1215    Assessment: Status: Still smoking, recheck BP normal, but is just borderline normal. Prior to January, BP's were always very well controlled. Given has not had stress test yet, can consider to escalate her BB as needed, as HRs in 80s, and many cardiac risk factors.  Disease Control: controlled  Progress toward goals: at goal  Barriers to meeting goals: no barriers identified   Plan:  continue current medications  Next visit, if borderline, or mildly elevated eye, consider to increase metoprolol to 50mg  BID, to help with BP, and cardioprotection as pt likely has some coronary disease given all of risk factors.

## 2011-06-13 NOTE — Progress Notes (Signed)
Quick Note:  During 3 month follow-up will recheck thyroid function tests to assess if elevated tsh is spuriously high. Johnette Abraham, D.O. ______

## 2011-06-20 ENCOUNTER — Other Ambulatory Visit: Payer: Self-pay | Admitting: *Deleted

## 2011-06-20 DIAGNOSIS — J449 Chronic obstructive pulmonary disease, unspecified: Secondary | ICD-10-CM

## 2011-06-20 MED ORDER — IPRATROPIUM-ALBUTEROL 18-103 MCG/ACT IN AERO: 2.0000 | INHALATION_SPRAY | Freq: Three times a day (TID) | RESPIRATORY_TRACT | Status: DC

## 2011-06-24 NOTE — Telephone Encounter (Signed)
Phoned in to pharmacy. 

## 2011-06-27 ENCOUNTER — Telehealth: Payer: Self-pay | Admitting: *Deleted

## 2011-06-27 NOTE — Telephone Encounter (Signed)
Call from [facility] stating they can now get spiriva at no cost for the patient.  Would you like to make this change?  Note from 04/25/11 Dr Yaakov Guthrie: Since she cannot get Spiriva affordably, will switch to Atrovent 4x per day scheduled and albuterol PRN. Since this may need to be gotten through MAP at [employer], will prescribe short supply of combivent in meanwhile since the pharmacy has some combivent inhalers waiting for her anyway.

## 2011-06-30 MED ORDER — TIOTROPIUM BROMIDE MONOHYDRATE 18 MCG IN CAPS: 18.0000 ug | ORAL_CAPSULE | Freq: Every day | RESPIRATORY_TRACT | Status: DC

## 2011-06-30 NOTE — Telephone Encounter (Signed)
Hi Gayle, yes please. I have dc'ed her Atrovent, and started Spiriva. Can you please call this in to the health department? Also, please call this pt and inform her of this change and instructions to Spiriva usage.

## 2011-07-01 NOTE — Telephone Encounter (Signed)
Rx called into pharmacy - [facility]

## 2011-07-03 ENCOUNTER — Encounter: Payer: Self-pay | Admitting: Internal Medicine

## 2011-07-25 ENCOUNTER — Telehealth: Payer: Self-pay | Admitting: *Deleted

## 2011-07-25 NOTE — Telephone Encounter (Signed)
There are several issues that are confusing.  It looks like her TSH was low at one point and the decision was made to lower the dose of synthroid.  This apparently did not happen secondary to multiple providers being involved in the care but then the most recent TSH was elevated (despite the dose never really being changed).  I agree that it will be important to get the pharmacy information on Monday and review all of this with the patient at her visit.  I suspect patient education on compliance or consistency of taking the medication may be playing a role in this discrepancy.  It is best clarified with records and a face to face meeting with the patient.

## 2011-07-25 NOTE — Telephone Encounter (Signed)
Call from [facility] asking for clarification on dose of Synthroid.   Per pt chart synthroid was decreased from 175 mcg to 125 mcg on 06/24/10. [facility] did not have this change.  Pt has been getting synthroid 175 mcg's since 04/03/11 Also note [facility] has filled synthroid 175 mcg in July and again in october.  [facility] aug [facility] and Feb 6  So confusing - on Monday I will call pharmacies and get printouts on refills.  Pt does have scheduled appointment 4/18.  Last TSH 14.038 on 06/12/11

## 2011-07-29 ENCOUNTER — Encounter: Payer: Self-pay | Admitting: Internal Medicine

## 2011-07-30 ENCOUNTER — Other Ambulatory Visit: Payer: Self-pay | Admitting: *Deleted

## 2011-07-30 DIAGNOSIS — J449 Chronic obstructive pulmonary disease, unspecified: Secondary | ICD-10-CM

## 2011-07-30 NOTE — Telephone Encounter (Signed)
Pt will be seen in clinic tomorrow to clarify exactly what she has been taking

## 2011-07-31 ENCOUNTER — Encounter: Payer: Self-pay | Admitting: Internal Medicine

## 2011-07-31 ENCOUNTER — Ambulatory Visit (INDEPENDENT_AMBULATORY_CARE_PROVIDER_SITE_OTHER): Payer: Self-pay | Admitting: Internal Medicine

## 2011-07-31 VITALS — BP 135/82 | HR 67 | Temp 97.1°F | Ht 63.0 in | Wt 191.8 lb

## 2011-07-31 DIAGNOSIS — F17200 Nicotine dependence, unspecified, uncomplicated: Secondary | ICD-10-CM

## 2011-07-31 DIAGNOSIS — D239 Other benign neoplasm of skin, unspecified: Secondary | ICD-10-CM

## 2011-07-31 DIAGNOSIS — I10 Essential (primary) hypertension: Secondary | ICD-10-CM

## 2011-07-31 DIAGNOSIS — K2270 Barrett's esophagus without dysplasia: Secondary | ICD-10-CM

## 2011-07-31 DIAGNOSIS — E039 Hypothyroidism, unspecified: Secondary | ICD-10-CM | POA: Insufficient documentation

## 2011-07-31 DIAGNOSIS — K648 Other hemorrhoids: Secondary | ICD-10-CM | POA: Insufficient documentation

## 2011-07-31 DIAGNOSIS — Z8639 Personal history of other endocrine, nutritional and metabolic disease: Secondary | ICD-10-CM | POA: Insufficient documentation

## 2011-07-31 DIAGNOSIS — N950 Postmenopausal bleeding: Secondary | ICD-10-CM | POA: Insufficient documentation

## 2011-07-31 DIAGNOSIS — D229 Melanocytic nevi, unspecified: Secondary | ICD-10-CM | POA: Insufficient documentation

## 2011-07-31 DIAGNOSIS — Z720 Tobacco use: Secondary | ICD-10-CM

## 2011-07-31 DIAGNOSIS — E785 Hyperlipidemia, unspecified: Secondary | ICD-10-CM

## 2011-07-31 MED ORDER — PRAVASTATIN SODIUM 40 MG PO TABS: 40.0000 mg | ORAL_TABLET | Freq: Every day | ORAL | Status: DC

## 2011-07-31 NOTE — Progress Notes (Signed)
Subjective:    Patient ID: Grace [name] female   DOB: December 07, 1955 56 y.o.   MRN: [id number]  HPI: [name] is a 56 y.o. with a PMHx of HTN, HLD, ongoing tobacco abuse, who presented to clinic today for the following:  1) HTN - Patient does check blood pressure regularly at home at night periodically from 130/70s mmHg. Currently taking Metoprolol 25 mg BID, Lisinopril-HCTZ 10-12.5 mg. Patient misses doses 0 x per week on average. denies headaches, dizziness, lightheadedness, no longer having exertional chest pain, previously experienced.   2) HLD - currently taking Pravastatin 20mg  daily. Patient misses doses 0 x per week on average. Admits to occasional exertional chest pain, shortness of breath. Denies palpitations, tachycardia, and muscle pains. does not request refills today. Last FLP showing LDL of 111, if she actually had a stroke, should have < 100 mg/dL. Therefore, should escalate Pravastatin to 40mg .  3) Hypothyroidism - Patient currently taking Synthroid 125 mcg - states she misses 0x/ week. Takes first thing in the morning 1-2 hours before any food consumption. Does take concurrently with her metoprolol, lisinopril-hctz, pravstatin. Current symptoms: brittle nails. Patient denies nervousness and weight changes. Symptoms have waxed and waned.   4) Tobacco abuse - patient continues to smoke 1-2 cigarettes per day which is down from 1-10 cigs/day for the past >25 years. There  have been prior attempts to quit - with success in 2001 when she went cold-turkey after her stroke. Barriers to quit (if any) include: none.  5) Barrett's esophagus - Denies dysphagia, painful swallowing, fullness after meals, belching and eructation, waterbrash, chest pain, dysphagia,. She is not currently utilizing pharmacologic therapy for symptoms.The patient also confirms the following factors that are likely contributing towards persistent symptoms: smoking 1-2 cig/day, using NSAID's regularly.  6)  Chest wall lesion - patient notes hyperpigmented mole on her chest. She cannot recall how long has been there, if it has grown or changed in color or character. Denies unintentional weight loss,    Review of Systems: Per HPI.   Current Outpatient Medications: Medication Sig  . aspirin EC 81 MG tablet Take 81 mg by mouth daily.  Marland Kitchen aspirin-acetaminophen-caffeine (EXCEDRIN EXTRA STRENGTH) 250-250-65 MG per tablet Take 1 tablet by mouth as directed. As needed   . chlorpheniramine-pseudoephedrine-acetaminophen (TYLENOL ALLERGY SINUS) 2-30-500 MG per tablet Take 1 tablet by mouth as needed.    Marland Kitchen dextromethorphan-guaifenesin (MUCINEX DM) 30-600 MG per 12 hr tablet Take 1 tablet by mouth 2 (two) times daily as needed. For sinus congestion   . esomeprazole (NEXIUM) 20 MG capsule Take 1 capsule (20 mg total) by mouth daily.  Marland Kitchen FEVERFEW EXTRACT PO Take 2 capsules by mouth 4 (four) times daily.    Marland Kitchen lisinopril-hydrochlorothiazide (PRINZIDE,ZESTORETIC) 10-12.5 MG per tablet Take 1 tablet by mouth daily.  . metoprolol tartrate (LOPRESSOR) 25 MG tablet Take 1 tablet (25 mg total) by mouth 2 (two) times daily.  . pravastatin (PRAVACHOL) 20 MG tablet Take 1 tablet (20 mg total) by mouth daily.  Marland Kitchen tiotropium (SPIRIVA HANDIHALER) 18 MCG inhalation capsule Place 1 capsule (18 mcg total) into inhaler and inhale daily.  Marland Kitchen albuterol (PROVENTIL,VENTOLIN) 90 MCG/ACT inhaler Inhale 2 puffs into the lungs every 6 (six) hours as needed for wheezing.  Marland Kitchen levothyroxine (SYNTHROID, LEVOTHROID) 125 MCG tablet Take 1 tablet (125 mcg total) by mouth daily.  . Ipratropium (ATROVENT HFA) 17 MCG/ACT inhaler Inhale 2 puffs into the lungs 4 (four) times daily.    Allergies  Allergen Reactions  .  Codeine     REACTION: headaches    Past Medical History  Diagnosis Date  . GERD (gastroesophageal reflux disease)   . Hyperlipidemia   . Hypertension   . Migraine   . COPD (chronic obstructive pulmonary disease)   .  Hypothyroidism     with hx of multinodular goiter (noted on neck US in 11/2002 - showing diffuse nodularity and inhomogenous texture diffusely BL)  . Asthma   . Hiatal hernia   . Barrett's esophagus     with high grade dysplasia per endoscopy 05/2005 // followed by Dr. Stan Head ([facility])  . History of CVA (cerebrovascular accident) 10/2000  . Endometrial polyp 03/2003    s/p resection in 03/2003. Path showing submucosal leiomyoma and benign  proliferative type endometrium  . Duodenal ulcer disease     thought to be contributed at least partly by overuse of NSAIDs and salicylates (goody powdr)  . Internal hemorrhoids     grade 2 per colonoscopy in 09/2006 - repeat colonoscopy rec in 5-10 years.  . 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 Fibroid uterus     s/p myomectomy x 2    Past Surgical History  Procedure Date  . Cholecystectomy   . Myomectomy 1990, 1997    x 2 - In 1997, noted to have extensive pelvic adhesions and BL tubal obstruction    Objective:    Physical Exam: Filed Vitals:   07/31/11 1354  BP: 135/82  Pulse: 67  Temp: 97.1 F (36.2 C)     General: Vital signs reviewed and noted. Well-developed, well-nourished, in no acute distress; alert, appropriate and cooperative throughout examination.  Head: Normocephalic, atraumatic.  Eyes: conjunctivae/corneas clear. PERRL.  Neck: No deformities, masses, or tenderness noted.  Lungs:  Normal respiratory effort. Clear to auscultation BL without crackles or wheezes.  Heart: RRR. S1 and S2 normal without gallop, rubs. No murmur. Chest wall - has 1.5-2 cm brown colored lesion on center of chest, flat.  Abdomen:  BS normoactive. Soft, Nondistended, non-tender.  No masses or organomegaly.  Extremities: No pretibial edema.     Assessment/ Plan:   Case and plan of care discussed with Dr. Ulyess Mort.   Unfortunately, prior to  paperwork being given, patient left clinic without telling anyone. We will try to call her next week with the referrals as detailed in A&P.

## 2011-07-31 NOTE — Patient Instructions (Signed)
   Please follow-up at the clinic in 2 months, at which time we will reevaluate your blood pressure, hypothyroidism - OR, please follow-up in the clinic sooner if needed.  There have been changes in your medications:  Please increase your pravastatin to 40 mg daily, prescription sent to [facility]  Please see the GI doctor.  Please see the dermatologist.   If you have been started on new medication(s), and you develop symptoms concerning for allergic reaction, including, but not limited to, throat closing, tongue swelling, rash, please stop the medication immediately and call the clinic at [phone], and go to the ER.  If you are diabetic, please bring your meter to your next visit.  If symptoms worsen, or new symptoms arise, please call the clinic or go to the ER.  Please bring all of your medications in a bag to your next visit.

## 2011-07-31 NOTE — Telephone Encounter (Signed)
[facility] MAP made awared of denial.

## 2011-08-01 NOTE — Assessment & Plan Note (Signed)
Assessment: Chest wall lesion, seems most consistent with a seborrheic keratosis, she has previously had biopsy of different lesion, that also was consistent with seborrheic keratosis. However, she does have family history of skin cancer.  Plan:      Will refer to dermatology.

## 2011-08-01 NOTE — Assessment & Plan Note (Signed)
Pertinent Labs: Liver Function Tests:    Component Value Date/Time   AST 16 12/05/2010 1215   ALT 18 12/05/2010 1215   ALKPHOS 69 12/05/2010 1215   BILITOT 0.3 12/05/2010 1215   PROT 7.1 12/05/2010 1215   ALBUMIN 4.2 12/05/2010 1215    Lipid Panel:     Component Value Date/Time   CHOL 202* 12/05/2010 1215   TRIG 244* 12/05/2010 1215   HDL 42 12/05/2010 1215   CHOLHDL 4.8 12/05/2010 1215   VLDL 49* 12/05/2010 1215   LDLCALC 111* 12/05/2010 1215     Assessment: Disease Control: not controlled  Progress toward goals: unable to assess  Barriers to meeting goals: no barriers identified    She has a reported hx of stroke, therefore, LDL goal should be < 100.   Patient is compliant most of the time with prescribed medications.    Plan:  Will increased to pravastatin 40 mg (from 20mg )  Recheck FLP next visit and get CMET at that time as well.

## 2011-08-01 NOTE — Assessment & Plan Note (Addendum)
1. The patient was counseled on the dangers of tobacco use, and was advised to quit and referred to a tobacco cessation program.   2. Will consider wellbutrin in future to help with cessation, and her increased mood instability since decreasing tobacco use. 3. Reviewed strategies to maximize success, including:  Removing cigarettes and smoking materials from environment  Stress management  Substitution of other forms of reinforcement Support of family/friends.  Selecting a quit date.

## 2011-08-01 NOTE — Assessment & Plan Note (Signed)
Assessment: Has been confirmed by EGD, last in 05/2005 by Dr. Leone Payor, with known high grade dysplasia. Did not follow-up with him again because she did not have the money, now is approved for 100% orange card.  Plan:      GI referral with Dr. Leone Payor for follow-up.

## 2011-08-01 NOTE — Assessment & Plan Note (Signed)
Pertinent Labs:  Ref. Range 06/12/2011 07/31/2011  TSH Latest Range: 0.350-4.500 uIU/mL 14.038 (H) 1.784  Free T4 Latest Range: 0.80-1.80 ng/dL 1.61      Assessment: Patient had TFTs in 05/2011 at which time she was noted to have TSH of 14.038, although free T4 was normal. The cause of this discordant result was not clear, and she has had fluctuations in her TSH values throughout the years thought to be previously attributed to her noncompliance.  However, today, patient assures me that she is 100% compliant with her synthroid. She takes in the morning 1-2 hours before meals - but does take with her other medications (which shouldn't really effect its absorption given the specific medications she is taking)  Plan:      Recheck TSH today - to assess if last test was spuriously elevated.

## 2011-08-01 NOTE — Assessment & Plan Note (Signed)
Pertinent Data: BP Readings from Last 3 Encounters:  07/31/11 135/82  06/12/11 137/91  04/25/11 156/96    Basic Metabolic Panel:    Component Value Date/Time   NA 141 12/05/2010 1215   K 4.7 12/05/2010 1215   CL 102 12/05/2010 1215   CO2 30 12/05/2010 1215   BUN 18 12/05/2010 1215   CREATININE 1.00 12/05/2010 1215   CREATININE 0.82 06/26/2009 1908   GLUCOSE 95 12/05/2010 1215   CALCIUM 10.0 12/05/2010 1215    Assessment: Disease Control: controlled  Progress toward goals: at goal  Barriers to meeting goals: no barriers identified    Patient is compliant most of the time with prescribed medications.   Plan:  continue current medications

## 2011-08-11 ENCOUNTER — Encounter: Payer: Self-pay | Admitting: Internal Medicine

## 2011-08-11 ENCOUNTER — Ambulatory Visit (INDEPENDENT_AMBULATORY_CARE_PROVIDER_SITE_OTHER): Payer: Self-pay | Admitting: Internal Medicine

## 2011-08-11 VITALS — BP 134/80 | HR 60 | Ht 63.0 in | Wt 191.0 lb

## 2011-08-11 DIAGNOSIS — R51 Headache: Secondary | ICD-10-CM

## 2011-08-11 DIAGNOSIS — K311 Adult hypertrophic pyloric stenosis: Secondary | ICD-10-CM | POA: Insufficient documentation

## 2011-08-11 DIAGNOSIS — K2270 Barrett's esophagus without dysplasia: Secondary | ICD-10-CM

## 2011-08-11 DIAGNOSIS — K22719 Barrett's esophagus with dysplasia, unspecified: Secondary | ICD-10-CM

## 2011-08-11 DIAGNOSIS — Z791 Long term (current) use of non-steroidal anti-inflammatories (NSAID): Secondary | ICD-10-CM

## 2011-08-11 DIAGNOSIS — Z9283 Personal history of failed moderate sedation: Secondary | ICD-10-CM

## 2011-08-11 DIAGNOSIS — K267 Chronic duodenal ulcer without hemorrhage or perforation: Secondary | ICD-10-CM | POA: Insufficient documentation

## 2011-08-11 HISTORY — DX: Personal history of failed moderate sedation: Z92.83

## 2011-08-11 MED ORDER — ESOMEPRAZOLE MAGNESIUM 40 MG PO CPDR: 40.0000 mg | DELAYED_RELEASE_CAPSULE | Freq: Every day | ORAL | Status: DC

## 2011-08-11 NOTE — Progress Notes (Signed)
  Subjective:    Patient ID: Grace [name], female    DOB: [birth year], 56 y.o.   MRN: [id number]  HPI This patient is known to me from prior Barrett's esophagus that has last been shown to have possible low-grade dysplasia as well as a chronic recurrent duodenal ulcer with stricturing causing mild gastric outlet obstruction. That has been related to chronic salicylate use related to headaches. She was last seen in the fall of 2012. Since then she says she's using Lasix sedimentation rate migraine and ibuprofen is still uses those several times a week for a few times a week at night for headaches. She is not on a chronic headache therapy. Her primary care physician was seeing her in the resident clinic and became concerned about a previous history of dysplasia in Barrett's and sent her back.  The patient reports a mild heartburn at times. She denies abdominal pain. Headache history as above. She is hoping to find work but is unable to do so. Medications, allergies, past medical history, past surgical history, family history and social history are reviewed and updated in the EMR.   Review of Systems As above.    Objective:   Physical Exam General:  NAD Eyes:   anicteric Lungs:  clear Heart:  S1S2 no rubs, murmurs or gallops Abdomen:  soft and mildly tender epigastrium, BS+ Ext:   no edema           Assessment & Plan:   1. Barrett's esophagus with dysplasia   2. 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 Patient takes NSAID (non-steroid anti-inflammatory drug)   4. Personal history of failed conscious sedation   5. Chronic headache    See problem oriented charting, will repeat EGD to reassess Barrett's esophagus and duodenal ulcer.  CC: KALIA-REYNOLDS, SHELLY, DO

## 2011-08-11 NOTE — Assessment & Plan Note (Signed)
She's had a chronic proximal duodenal ulcer with stricture but due to excessive nonsteroidal use taken for headaches. Last seen fall 2011 requiring dilation of stricture to view. Helicobacter pylori biopsies negative.  It is appropriate to reassess this though she continues to use intermittent and sedated so it may still be there. Plan for possible dilation again. She was tender in her epigastrium, she denies significant gastric outlet symptoms.

## 2011-08-11 NOTE — Assessment & Plan Note (Signed)
Stay on PPI, increase to Nexium 40 mg daily. Reassess with repeat EGD.

## 2011-08-11 NOTE — Patient Instructions (Signed)
You have been scheduled for an endoscopy and possible duodenal dilation with propofol at [facility]. Please follow written instructions given to you at your visit today. We have sent the following medications to your pharmacy for you to pick up at your convenience: Nexium 40 mg once daily (take in place of Nexium 20 mg daily)

## 2011-08-11 NOTE — Assessment & Plan Note (Signed)
These need to be addressed, hopefully with suppressive therapy. I do not know whether she truly has migraine her other chronic headaches but the chronic nonsteroidal use is problematic. Await repeat EGD, hopefully she can be reassessed for these headaches through primary care perhaps neurology.

## 2011-08-21 ENCOUNTER — Other Ambulatory Visit: Payer: Self-pay | Admitting: *Deleted

## 2011-08-21 ENCOUNTER — Telehealth: Payer: Self-pay | Admitting: *Deleted

## 2011-08-21 DIAGNOSIS — E039 Hypothyroidism, unspecified: Secondary | ICD-10-CM

## 2011-08-21 MED ORDER — LEVOTHYROXINE SODIUM 125 MCG PO TABS: 125.0000 ug | ORAL_TABLET | Freq: Every day | ORAL | Status: DC

## 2011-08-21 NOTE — Telephone Encounter (Signed)
[facility] pharmacy needs refill on Combivent inhaler - inhale 2 puffs into lungs three times daily - on hx med info.

## 2011-08-21 NOTE — Telephone Encounter (Signed)
Please tell them to stop asking me for this medication. She was only getting it while her atrovent was not in stock. She is now on the separate Albuterol/ atrovent inhalers. She no longer is taking the combivent inhaler. I have refused multiple refills of this, please call and make sure they are aware.   Thank you.  Johnette Abraham, D.O.

## 2011-08-21 NOTE — Telephone Encounter (Signed)
Please phone in.  Thank you.  Sincerely,  Johnette Abraham, D.O.

## 2011-08-21 NOTE — Telephone Encounter (Signed)
Rx phoned into pharmacy.

## 2011-08-22 NOTE — Telephone Encounter (Signed)
Dr Saralyn Pilar note sent to pharmacy.

## 2011-08-28 ENCOUNTER — Ambulatory Visit (HOSPITAL_COMMUNITY): Admission: RE | Admit: 2011-08-28 | Payer: Self-pay | Source: Ambulatory Visit | Admitting: Internal Medicine

## 2011-08-28 ENCOUNTER — Encounter (HOSPITAL_COMMUNITY): Admission: RE | Payer: Self-pay | Source: Ambulatory Visit

## 2011-08-28 SURGERY — ESOPHAGOGASTRODUODENOSCOPY (EGD) WITH PROPOFOL
Anesthesia: Monitor Anesthesia Care

## 2011-09-12 ENCOUNTER — Telehealth: Payer: Self-pay | Admitting: *Deleted

## 2011-09-12 NOTE — Telephone Encounter (Signed)
YES, this is correct. She is on Albuterol and Spiriva ONLY. (the other medications duplicate the pharmacologic classes of the medications she is already on). I have sent them this information multiple times. Please ask them to update their records to reflect this change. Thank you.  Johnette Abraham, D.O.

## 2011-09-12 NOTE — Telephone Encounter (Signed)
Left message on [facility] pharmacy ID recording Dr Saralyn Pilar response.

## 2011-09-12 NOTE — Telephone Encounter (Signed)
[facility] pharmacy called -   to make sure inhalers are correct - pt is on Spiriva and Albuterol and Combivent and Atrovent are discontinued. Stanton Kidney Alba Perillo RN 09/11/11 10:30AM

## 2011-10-23 NOTE — Addendum Note (Signed)
Addended by: Neomia Dear on: 10/23/2011 07:33 PM   Modules accepted: Orders

## 2011-11-13 ENCOUNTER — Encounter: Payer: Self-pay | Admitting: Internal Medicine

## 2011-11-13 ENCOUNTER — Ambulatory Visit (INDEPENDENT_AMBULATORY_CARE_PROVIDER_SITE_OTHER): Payer: Self-pay | Admitting: Internal Medicine

## 2011-11-13 VITALS — BP 129/86 | HR 70 | Temp 98.2°F | Ht 63.0 in | Wt 203.7 lb

## 2011-11-13 DIAGNOSIS — F17200 Nicotine dependence, unspecified, uncomplicated: Secondary | ICD-10-CM

## 2011-11-13 DIAGNOSIS — E039 Hypothyroidism, unspecified: Secondary | ICD-10-CM

## 2011-11-13 DIAGNOSIS — M545 Low back pain, unspecified: Secondary | ICD-10-CM

## 2011-11-13 DIAGNOSIS — E785 Hyperlipidemia, unspecified: Secondary | ICD-10-CM

## 2011-11-13 DIAGNOSIS — J4489 Other specified chronic obstructive pulmonary disease: Secondary | ICD-10-CM

## 2011-11-13 DIAGNOSIS — J449 Chronic obstructive pulmonary disease, unspecified: Secondary | ICD-10-CM

## 2011-11-13 DIAGNOSIS — I10 Essential (primary) hypertension: Secondary | ICD-10-CM

## 2011-11-13 DIAGNOSIS — Z0000 Encounter for general adult medical examination without abnormal findings: Secondary | ICD-10-CM

## 2011-11-13 DIAGNOSIS — G43009 Migraine without aura, not intractable, without status migrainosus: Secondary | ICD-10-CM

## 2011-11-13 DIAGNOSIS — Z1239 Encounter for other screening for malignant neoplasm of breast: Secondary | ICD-10-CM

## 2011-11-13 DIAGNOSIS — J45909 Unspecified asthma, uncomplicated: Secondary | ICD-10-CM

## 2011-11-13 DIAGNOSIS — Z8673 Personal history of transient ischemic attack (TIA), and cerebral infarction without residual deficits: Secondary | ICD-10-CM

## 2011-11-13 DIAGNOSIS — G8929 Other chronic pain: Secondary | ICD-10-CM

## 2011-11-13 DIAGNOSIS — Z720 Tobacco use: Secondary | ICD-10-CM

## 2011-11-13 LAB — COMPREHENSIVE METABOLIC PANEL
ALT: 17 U/L (ref 0–35)
AST: 36 U/L (ref 0–37)
Alkaline Phosphatase: 51 U/L (ref 39–117)
CO2: 32 mEq/L (ref 19–32)
Creat: 1.15 mg/dL — ABNORMAL HIGH (ref 0.50–1.10)
Sodium: 139 mEq/L (ref 135–145)
Total Bilirubin: 0.3 mg/dL (ref 0.3–1.2)
Total Protein: 7 g/dL (ref 6.0–8.3)

## 2011-11-13 LAB — LIPID PANEL
Cholesterol: 264 mg/dL — ABNORMAL HIGH (ref 0–200)
HDL: 69 mg/dL (ref >39)
Total CHOL/HDL Ratio: 3.8 Ratio

## 2011-11-13 MED ORDER — CYCLOBENZAPRINE HCL 10 MG PO TABS: 10.0000 mg | ORAL_TABLET | Freq: Three times a day (TID) | ORAL | Status: DC | PRN

## 2011-11-13 MED ORDER — MELOXICAM 15 MG PO TABS: 15.0000 mg | ORAL_TABLET | Freq: Every day | ORAL | Status: DC

## 2011-11-13 MED ORDER — LEVOTHYROXINE SODIUM 125 MCG PO TABS: 125.0000 ug | ORAL_TABLET | Freq: Every day | ORAL | Status: DC

## 2011-11-13 NOTE — Assessment & Plan Note (Signed)
1. The patient was counseled on the dangers of tobacco use, and was advised to quit, referred to a tobacco cessation program and reluctant to quit.   2. Reviewed strategies to maximize success, including:  Removing cigarettes and smoking materials from environment  Stress management  Substitution of other forms of reinforcement Support of family/friends.  Selecting a quit date.  Patient provided contact information for 1-800-QUIT-NOW    

## 2011-11-13 NOTE — Assessment & Plan Note (Signed)
Pertinent Data: BP Readings from Last 3 Encounters:  11/13/11 129/86  08/11/11 134/80  07/31/11 135/82    Basic Metabolic Panel:    Component Value Date/Time   NA 141 12/05/2010 1215   K 4.7 12/05/2010 1215   CL 102 12/05/2010 1215   CO2 30 12/05/2010 1215   BUN 18 12/05/2010 1215   CREATININE 1.00 12/05/2010 1215   CREATININE 0.82 06/26/2009 1908   GLUCOSE 95 12/05/2010 1215   CALCIUM 10.0 12/05/2010 1215    Assessment: Disease Control: controlled  Progress toward goals: at goal  Barriers to meeting goals: no barriers identified      Patient is compliant all of the time with prescribed medications.   Plan:  continue current medications

## 2011-11-13 NOTE — Assessment & Plan Note (Signed)
Health Maintenance  Topic Date Due  . Pap Smear  05/10/1973  . Mammogram  04/17/2011  . Influenza Vaccine  01/13/2012  . Colonoscopy  10/07/2016  . Tetanus/tdap  06/11/2021    Assessment:  Due for PAP and mammo - no time for PAP today given multiple concerns today.  Plan:  Mammo referral   Plan PAP next visit if able.

## 2011-11-13 NOTE — Assessment & Plan Note (Addendum)
Pertinent Labs: Lab Results  Component Value Date   TSH 1.784 07/31/2011   FREET4 1.44 06/12/2011    Assessment: Thyroid Control: controlled. Currently is not symptomatic.   Barriers to meeting goals: nonadherence to medications- there was some confusion about where her synthroid should be filled. Apparently, she is in the middle of applying for medicaid, health department then stated that they would only fill synthroid if get a denial letter from [facility]. The patient never followed up the issue. She has been out of medications x several weeks instead.   Plan:  continue current medications  Refills sent to [facility] instead - pt states she can afford.  Can consider check TFTs 6 weeks after resumption of therapy.

## 2011-11-13 NOTE — Patient Instructions (Signed)
Please follow-up at the clinic in 1 month, at which time we will reevaluate your back pain and plan to do a PAP - OR, please follow-up in the clinic sooner if needed.  There have been changes in your medications:  STOP Ibuprofen  START Meloxicam once daily (MAXIMUM) - do not take other anti-inflammatories at the same time  START Flexeril up to three times daily - You have been started on a new medication that can cause drowsiness, do not drive or operate heavy machinery . Do not take this medication with alcohol.      You are getting labs today, if they are abnormal I will give you a call.   Get your mammogram  Follow-up with Physical therapy.  Work on stopping to smoke - see tips below.    If you have been started on new medication(s), and you develop symptoms concerning for allergic reaction, including, but not limited to, throat closing, tongue swelling, rash, please stop the medication immediately and call the clinic at [phone], and go to the ER.  If symptoms worsen, or new symptoms arise, please call the clinic or go to the ER.  Please bring all of your medications in a bag to your next visit.   Smoking Cessation Tips 1-800-QUIT-NOW  This document explains the best ways for you to quit smoking and new treatments to help. It lists new medicines that can double or triple your chances of quitting and quitting for good. It also considers ways to avoid relapses and concerns you may have about quitting, including weight gain.   NICOTINE: A POWERFUL ADDICTION:  If you have tried to quit smoking, you know how hard it can be. It is hard because nicotine is a very addictive drug. For some people, it can be as addictive as heroin or cocaine. Usually, people make 2 or 3 tries, or more, before finally being able to quit. Each time you try to quit, you can learn about what helps and what hurts. Quitting takes hard work and a lot of effort, but you can quit smoking.   QUITTING SMOKING IS  ONE OF THE MOST IMPORTANT THINGS YOU WILL EVER DO:  You will live longer, feel better, and live better.  The impact on your body of quitting smoking is felt almost immediately:  Within 20 minutes, blood pressure decreases. Pulse returns to its normal level.  After 8 hours, carbon monoxide levels in the blood return to normal. Oxygen level increases.  After 24 hours, chance of heart attack starts to decrease. Breath, hair, and body stop smelling like smoke.  After 48 hours, damaged nerve endings begin to recover. Sense of taste and smell improve.  After 72 hours, the body is virtually free of nicotine. Bronchial tubes relax and breathing becomes easier.  After 2 to 12 weeks, lungs can hold more air. Exercise becomes easier and circulation improves.  Quitting will lower your chance of having a heart attack, stroke, cancer, or lung disease:  After 1 year, the risk of coronary heart disease is cut in half.  After 5 years, the risk of stroke falls to the same as a nonsmoker.  After 10 years, the risk of lung cancer is cut in half and the risk of other cancers decreases significantly.  After 15 years, the risk of coronary heart disease drops, usually to the level of a nonsmoker.  If you are pregnant, quitting smoking will improve your chances of having a healthy baby.  The people you live  with, especially your children, will be healthier.  You will have extra money to spend on things other than cigarettes.   FIVE KEYS TO QUITTING:  Studies have shown that these 5 steps will help you quit smoking and quit for good. You have the best chances of quitting if you use them together:   1. GET READY  Set a quit date.  Change your environment.  Get rid of ALL cigarettes, ashtrays, matches, and lighters in your home, car, and place of work.  Do not let people smoke in your home.  Review your past attempts to quit. Think about what worked and what did not.  Once you quit, do not smoke. NOT EVEN A PUFF!    2. GET SUPPORT AND ENCOURAGEMENT  Studies have shown that you have a better chance of being successful if you have help. You can get support in many ways. Tell your family, friends, and coworkers that you are going to quit and need their support. Ask them not to smoke around you.  Talk to your caregivers (doctor, dentist, nurse, pharmacist, psychologist, and/or smoking counselor).  Get individual, group, or telephone counseling and support. The more counseling you have, the better your chances are of quitting. Programs are available at [employer] and health centers. Call your local health department for information about programs in your area.  Spiritual beliefs and practices may help some smokers quit.  Quit meters are Photographer that keep track of quit statistics, such as amount of "quit-time," cigarettes not smoked, and money saved.  Many smokers find one or more of the many self-help books available useful in helping them quit and stay off tobacco.   3. LEARN NEW SKILLS AND BEHAVIORS  Try to distract yourself from urges to smoke. Talk to someone, go for a walk, or occupy your time with a task.  When you first try to quit, change your routine. Take a different route to work. Drink tea instead of coffee. Eat breakfast in a different place.  Do something to reduce your stress. Take a hot bath, exercise, or read a book.  Plan something enjoyable to do every day. Reward yourself for not smoking.  Explore interactive web-based programs that specialize in helping you quit.   4. GET MEDICINE AND USE IT CORRECTLY .  Medicines can help you stop smoking and decrease the urge to smoke. Combining medicine with the above behavioral methods and support can quadruple your chances of successfully quitting smoking.  Talk with your doctor about these options.  5. BE PREPARED FOR RELAPSE OR DIFFICULT SITUATIONS  Most relapses occur within the first 3 months after  quitting. Do not be discouraged if you start smoking again. Remember, most people try several times before they finally quit.  You may have symptoms of withdrawal because your body is used to nicotine. You may crave cigarettes, be irritable, feel very hungry, cough often, get headaches, or have difficulty concentrating.  The withdrawal symptoms are only temporary. They are strongest when you first quit, but they will go away within 10 to 14 days.    Here are some difficult situations to watch for:  Alcohol. Avoid drinking alcohol. Drinking lowers your chances of successfully quitting.  Caffeine. Try to reduce the amount of caffeine you consume. It also lowers your chances of successfully quitting.  Other smokers. Being around smoking can make you want to smoke. Avoid smokers.  Weight gain. Many smokers will gain weight when they quit, usually  less than 10 pounds. Eat a healthy diet and stay active. Do not let weight gain distract you from your main goal, quitting smoking. Some medicines that help you quit smoking may also help delay weight gain. You can always lose the weight gained after you quit.  Bad mood or depression. There are a lot of ways to improve your mood other than smoking.   If you are having problems with any of these situations, talk to your caregiver.   SPECIAL SITUATIONS OR CONDITIONS:  Studies suggest that everyone can quit smoking. Your situation or condition can give you a special reason to quit. Pregnant women/New mothers: By quitting, you protect your baby's health and your own.  Hospitalized patients: By quitting, you reduce health problems and help healing.  Heart attack patients: By quitting, you reduce your risk of a second heart attack.  Lung, head, and neck cancer patients: By quitting, you reduce your chance of a second cancer.  Parents of children and adolescents: By quitting, you protect your children from illnesses caused by secondhand smoke.   QUESTIONS TO  THINK ABOUT: Think about the following questions before you try to stop smoking. You may want to talk about your answers with your caregiver.  Why do you want to quit?  If you tried to quit in the past, what helped and what did not?  What will be the most difficult situations for you after you quit? How will you plan to handle them?  Who can help you through the tough times? Your family? Friends? Caregiver?  What pleasures do you get from smoking? What ways can you still get pleasure if you quit?   Here are some questions to ask your caregiver:  How can you help me to be successful at quitting?  What medicine do you think would be best for me and how should I take it?  What should I do if I need more help?  What is smoking withdrawal like? How can I get information on withdrawal?   Quitting takes hard work and a lot of effort, but you can quit smoking.   FOR MORE INFORMATION  Smokefree.gov (http://www.davis-sullivan.com/) provides free, accurate, evidence-based information and professional assistance to help support the immediate and long-term needs of people trying to quit smoking.  Document Released: 03/25/2001 Document Re-Released: 09/18/2009  [facility] Patient Information 2011 [place], [place].   List of NSAIDS (Non-steroidal anti-inflammatory medications)  Choline magnesium trisalicylate (Trilisate)  Diflunisal (Dolobid)  Celecoxib (Celebrex) Diclofenac (Cambia, Cataflam, Flector, Pennsaid, Solaraze Gel, Voltaren, Voltaren Gel, Voltaren-XR, Voltaren Ophthalmic, Zipsor)   Etodolac (Lodine, Lodine XL)  Fenoprofen (Nalfon)  Flurbiprofen (Ansaid)  Ibuprofen (Motrin, Advil, Nuprin)  Indomethacin (Indocin, Indocin SR)  Ketoprofen (Orudis, Actron, Oruvail)  Ketorolac (Sprix, Toradol)  Meclofenamate (Meclomen)  Meloxicam (Mobic)  Nabumetone (Relafen)  Naproxen (Naprosyn, Aleve, Anaprox, Naprelan)  Oxaprozin (Daypro)  Piroxicam (Feldene)  Salsalate  (Salflex, Disalcid, Amigesic)  Sulindac (Clinoril)  Tolmetin (Tolectin)

## 2011-11-13 NOTE — Assessment & Plan Note (Addendum)
Pertinent Labs: Liver Function Tests:    Component Value Date/Time   AST 16 12/05/2010 1215   ALT 18 12/05/2010 1215   ALKPHOS 69 12/05/2010 1215   BILITOT 0.3 12/05/2010 1215   PROT 7.1 12/05/2010 1215   ALBUMIN 4.2 12/05/2010 1215    Lipid Panel:     Component Value Date/Time   CHOL 202* 12/05/2010 1215   TRIG 244* 12/05/2010 1215   HDL 42 12/05/2010 1215   CHOLHDL 4.8 12/05/2010 1215   VLDL 49* 12/05/2010 1215   LDLCALC 111* 12/05/2010 1215     Assessment: Disease Control: not controlled - goal LDL < 100  Progress toward goals: unable to assess  Barriers to meeting goals: no barriers identified     Patient is fasting today.   Patient is compliant all of the time with prescribed medications.    Plan:  continue current medications  Check FLP today.  ADDENDUM TO ABOVE AFTER LABS RESULTED: Pertinent Labs:  Ref. Range 11/13/2011 11:32  Cholesterol Latest Range: 0-200 mg/dL 562 (H)  Triglycerides Latest Range: <150 mg/dL 130 (H)  HDL Latest Range: >39 mg/dL 69  LDL (calc) Latest Range: 0-99 mg/dL 865 (H)  VLDL Latest Range: 0-40 mg/dL 31  Total CHOL/HDL Ratio No range found 3.8   Assessment:  LDL not at goal, is actually a lot worse than last year, despite being on Pravastatin.  Goal LDL < 100 mg/dL given all of her risk factors and history of stroke.  Plan:  DC Pravastatin.  Start Lipitor 80mg  daily.  Recheck lipid panel in 6 weeks --> adjust therapy as indicated.  Patient was called and informed of these results and need to change therapy. She was instructed to call clinic or go to ER if any indication of side effects or allergic reaction.  Patient is in a loop hole right now in terms of orange card and Medicaid. [employer] card is not providing services until she gets them a Medicaid denial letter.  Therefore, I was able to find a coupon for Lipitor on [employer] that was emailed to the patient, that should hopefully get the prescription down to ~$15 at [facility] for 30  pills.  I am hoping this coupon works - and in the interim, I have instructed the patient to attempt to get her orange card/ Medicaid issue sorted out.

## 2011-11-13 NOTE — Assessment & Plan Note (Signed)
Assessment: Patient indicates 5 year history of gradually progressing low back pain, worst over the past one year. 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 At present, the patient's exam is most consistent with musculoskeletal back pain, a large contribution of muscle spasm evidence on exam.  She is currently over utilizing Advil (4 tablets every 5 hours), states that Tylenol does not work for her. States that she wants to pursue disability because of her back pain. Has not previously overuse physical therapy. There are no indications of radicular symptoms, and no red flag symptoms.  Plan:      Will discontinue Advil.   Start meloxicam once daily. - Continue PPI while on therapy   Patient was advised that she should absolutely avoid all NSAID therapy. She is provided a list of other NSAIDs that she should avoid. The patient expresses understanding, and the consequences of GI bleed if she continues at her current NSAID regimen.   We'll start Flexeril 10 mg 3 times a day as needed.   Lower for physical therapy, as I think there is a largely muscular component to her pain - greatly benefit from stretching exercises as well.  Patient is advised to return to clinic if symptoms worsen or new symptoms arise.

## 2011-11-13 NOTE — Progress Notes (Signed)
Subjective:    Patient ID: Grace [name]   Gender: female   DOB: 12-25-1955   Age: 56 y.o.   MRN: [id number]  HPI: [name] is a 56 y.o. with a PMHx of HTN, HLD, ongoing tobacco abuse, who presented to clinic today for the following:  1) Chronic lumbar back pain - Patient describes a 5 years history of gradually worsening over last 1 year, constant throbbing and occasionally sharp and spasmy back pain located over lower back worse on the right side and right hip. Currently rated 10/10 in severity. At its worst, the pain is rated 10/10 in severity. Aggravating factors include: prolonged activity, standing upright for 5-10 minutes. Alleviating factors include: ibuprofen and heating pad - which are helping somewhat, but little relief. Taking 4 pills every 5 hours for the pain. Has not seen physical therapy Patient denies weakness, leg pain, leg weakness, new numbness, new weakness, perianal numbness, paresthesias, fever, history of cancer, history of osteoporosis, history of steroid use. Confirms symptoms of postmenopausal.  2) HTN - Patient does not check blood pressure regularly at home. Currently taking Lisinopril-HCTZ 10-12.5 mg. Patient misses doses 0 x per week on average. denies headaches, dizziness, lightheadedness, chest pain, shortness of breath.  does not request refills today.   3) Preventative health care - due for mammogram, and needs PAP.  4) HLD - currently taking Pravasatin 40mg . Patient misses doses 0 x per week on average. denies chest pain, difficulty breathing, palpitations, tachycardia, and muscle pains. does not request refills today.   5) Hypothyroidism - Patient currently taking Synthroid 125 mcg daily - has been out for several week..  Current symptoms: none. Patient denies change in energy level, diarrhea, heat / cold intolerance, nervousness, palpitations and weight changes. Symptoms have been well-controlled.    Review of Systems: Per HPI.   Current  Outpatient Medications: Medication Sig  . albuterol (PROVENTIL,VENTOLIN) 90 MCG/ACT inhaler Inhale 2 puffs into the lungs every 6 (six) hours as needed for wheezing.  Marland Kitchen aspirin EC 81 MG tablet Take 81 mg by mouth daily.  Marland Kitchen aspirin-acetaminophen-caffeine (EXCEDRIN MIGRAINE) 250-250-65 MG per tablet Take 2 tablets by mouth every 6 (six) hours as needed.  . chlorpheniramine-pseudoephedrine-acetaminophen (TYLENOL ALLERGY SINUS) 2-30-500 MG per tablet Take 1 tablet by mouth as needed.    Marland Kitchen esomeprazole (NEXIUM) 40 MG capsule Take 1 capsule (40 mg total) by mouth daily.  Marland Kitchen ibuprofen (ADVIL,MOTRIN) 200 MG tablet Take 800 mg by mouth every 6 (six) hours as needed.  Marland Kitchen levothyroxine (SYNTHROID, LEVOTHROID) 125 MCG tablet Take 1 tablet (125 mcg total) by mouth daily.  Marland Kitchen lisinopril-hydrochlorothiazide (PRINZIDE,ZESTORETIC) 10-12.5 MG per tablet Take 1 tablet by mouth daily.  . metoprolol tartrate (LOPRESSOR) 25 MG tablet Take 1 tablet (25 mg total) by mouth 2 (two) times daily.  . pravastatin (PRAVACHOL) 40 MG tablet Take 1 tablet (40 mg total) by mouth daily.  Marland Kitchen tiotropium (SPIRIVA HANDIHALER) 18 MCG inhalation capsule Place 1 capsule (18 mcg total) into inhaler and inhale daily.    Allergies  Allergen Reactions  . Codeine     REACTION: headaches    Past Medical History  Diagnosis Date  . GERD (gastroesophageal reflux disease)   . Hyperlipidemia   . Hypertension   . Migraine   . COPD (chronic obstructive pulmonary disease)   . Hypothyroidism     with hx of multinodular goiter (noted on neck US in 11/2002 - showing diffuse nodularity and inhomogenous texture diffusely BL)  . Asthma   .  Hiatal hernia   . Barrett's esophagus     with high grade dysplasia per endoscopy 05/2005 // followed by Dr. Stan Head ([facility])  . History of CVA (cerebrovascular accident) 10/2000  . Endometrial polyp 03/2003    s/p resection in 03/2003. Path showing submucosal leiomyoma and benign  proliferative type  endometrium  . Duodenal ulcer disease     thought to be contributed at least partly by overuse of NSAIDs and salicylates (goody powdr)  . Internal hemorrhoids     grade 2 per colonoscopy in 09/2006 - repeat colonoscopy rec in 5-10 years.  . 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 Fibroid uterus     s/p myomectomy x 2  . Tachycardia   . Nevus   . H/O failed moderate sedation     Past Surgical History  Procedure Date  . Cholecystectomy   . Myomectomy 1990, 1997    x 2 - In 1997, noted to have extensive pelvic adhesions and BL tubal obstruction  . Colonoscopy 02/07/2010  . Upper gastrointestinal endoscopy 12/18/2009  . Hysteroscopy      Objective:    Physical Exam: Filed Vitals:   11/13/11 0951  BP: 142/95  Pulse: 83  Temp: 98.2 F (36.8 C)      General: Vital signs reviewed and noted. Well-developed, well-nourished, in no acute distress; alert, appropriate and cooperative throughout examination.  Head: Normocephalic, atraumatic.  Eyes: conjunctivae/corneas clear. PERRL.  Ears: TM nonerythematous, not bulging, good light reflex bilaterally.  Nose: Mucous membranes moist, not inflammed, nonerythematous.  Throat: Oropharynx nonerythematous, no exudate appreciated.   Neck: No deformities, masses, or tenderness noted.  Lungs:  Normal respiratory effort. Clear to auscultation BL without crackles or wheezes.  Heart: RRR. S1 and S2 normal without gallop, rubs. No murmur.  Abdomen:  BS normoactive. Soft, Nondistended, non-tender.  No masses or organomegaly.  Extremities: No pretibial edema. Lower extremities - bilateral negative straight leg raise. No hip pain with internal or external rotation of the hip. Multiple areas of spasm noted in the lumbar and lower thoracic paraspinal musculature.   Neurologic: A&O X3, CN II - XII are grossly intact.       Assessment/ Plan:   Case and plan of  care discussed with Dr. Doneen Poisson.

## 2011-11-14 ENCOUNTER — Ambulatory Visit (HOSPITAL_COMMUNITY)
Admission: RE | Admit: 2011-11-14 | Discharge: 2011-11-14 | Disposition: A | Discharge status: Home / Self Care | Payer: Self-pay | Source: Ambulatory Visit | Attending: Internal Medicine | Admitting: Internal Medicine

## 2011-11-14 DIAGNOSIS — Z1239 Encounter for other screening for malignant neoplasm of breast: Secondary | ICD-10-CM

## 2011-11-14 DIAGNOSIS — Z1231 Encounter for screening mammogram for malignant neoplasm of breast: Secondary | ICD-10-CM | POA: Insufficient documentation

## 2011-11-15 MED ORDER — ATORVASTATIN CALCIUM 80 MG PO TABS: 80.0000 mg | ORAL_TABLET | Freq: Every day | ORAL | Status: DC

## 2011-11-15 NOTE — Addendum Note (Signed)
Addended by: Priscella Mann on: 11/15/2011 04:30 PM   Modules accepted: Orders

## 2011-11-15 NOTE — Progress Notes (Signed)
Quick Note:  Renal function declining, likely secondary to exorbitant amount of NSAIDs she is using. She was called and cautioned about this. Will plan to recheck BMET at next visit. ______

## 2011-11-15 NOTE — Progress Notes (Signed)
Quick Note:  LDL not at goal of < 100 mg/dL despite tx with pravastatin 40mg . Will dc pravastatin, change to Lipitor. See note for details. ______

## 2011-11-17 ENCOUNTER — Encounter: Payer: Self-pay | Admitting: Internal Medicine

## 2011-11-17 DIAGNOSIS — N179 Acute kidney failure, unspecified: Secondary | ICD-10-CM | POA: Insufficient documentation

## 2011-12-02 ENCOUNTER — Ambulatory Visit: Payer: Self-pay | Attending: Internal Medicine

## 2011-12-02 DIAGNOSIS — M2560 Stiffness of unspecified joint, not elsewhere classified: Secondary | ICD-10-CM | POA: Insufficient documentation

## 2011-12-02 DIAGNOSIS — M545 Low back pain, unspecified: Secondary | ICD-10-CM | POA: Insufficient documentation

## 2011-12-02 DIAGNOSIS — IMO0001 Reserved for inherently not codable concepts without codable children: Secondary | ICD-10-CM | POA: Insufficient documentation

## 2011-12-02 DIAGNOSIS — M25559 Pain in unspecified hip: Secondary | ICD-10-CM | POA: Insufficient documentation

## 2011-12-08 ENCOUNTER — Ambulatory Visit: Payer: Self-pay

## 2011-12-10 ENCOUNTER — Ambulatory Visit: Payer: Self-pay

## 2011-12-17 ENCOUNTER — Ambulatory Visit: Payer: Self-pay | Attending: Internal Medicine | Admitting: Physical Therapy

## 2011-12-17 DIAGNOSIS — M25559 Pain in unspecified hip: Secondary | ICD-10-CM | POA: Insufficient documentation

## 2011-12-17 DIAGNOSIS — M2560 Stiffness of unspecified joint, not elsewhere classified: Secondary | ICD-10-CM | POA: Insufficient documentation

## 2011-12-17 DIAGNOSIS — IMO0001 Reserved for inherently not codable concepts without codable children: Secondary | ICD-10-CM | POA: Insufficient documentation

## 2011-12-17 DIAGNOSIS — M545 Low back pain, unspecified: Secondary | ICD-10-CM | POA: Insufficient documentation

## 2011-12-19 ENCOUNTER — Ambulatory Visit: Payer: Self-pay

## 2011-12-24 ENCOUNTER — Ambulatory Visit: Payer: Self-pay | Admitting: Rehabilitative and Restorative Service Providers"

## 2011-12-25 ENCOUNTER — Ambulatory Visit: Payer: Self-pay

## 2011-12-25 ENCOUNTER — Ambulatory Visit (INDEPENDENT_AMBULATORY_CARE_PROVIDER_SITE_OTHER): Payer: Self-pay | Admitting: Internal Medicine

## 2011-12-25 ENCOUNTER — Encounter: Payer: Self-pay | Admitting: Internal Medicine

## 2011-12-25 VITALS — BP 102/70 | HR 103 | Temp 97.1°F | Ht 63.0 in | Wt 195.3 lb

## 2011-12-25 DIAGNOSIS — N179 Acute kidney failure, unspecified: Secondary | ICD-10-CM

## 2011-12-25 DIAGNOSIS — Z0000 Encounter for general adult medical examination without abnormal findings: Secondary | ICD-10-CM

## 2011-12-25 DIAGNOSIS — I10 Essential (primary) hypertension: Secondary | ICD-10-CM

## 2011-12-25 DIAGNOSIS — G8929 Other chronic pain: Secondary | ICD-10-CM

## 2011-12-25 DIAGNOSIS — F17200 Nicotine dependence, unspecified, uncomplicated: Secondary | ICD-10-CM

## 2011-12-25 DIAGNOSIS — E785 Hyperlipidemia, unspecified: Secondary | ICD-10-CM

## 2011-12-25 DIAGNOSIS — E039 Hypothyroidism, unspecified: Secondary | ICD-10-CM

## 2011-12-25 DIAGNOSIS — Z720 Tobacco use: Secondary | ICD-10-CM

## 2011-12-25 DIAGNOSIS — M545 Low back pain: Secondary | ICD-10-CM

## 2011-12-25 MED ORDER — LEVOTHYROXINE SODIUM 125 MCG PO TABS: 125.0000 ug | ORAL_TABLET | Freq: Every day | ORAL | Status: DC

## 2011-12-25 NOTE — Patient Instructions (Addendum)
Please follow-up at the clinic in 1 month, at which time we will reevaluate your cholesterol, blood pressure, smoking - OR, please follow-up in the clinic sooner if needed.  There have not been changes in your medications  Please go to the MAP office to assess eligibility as soon as you can. You are getting labs today, if they are abnormal I will give you a call. You are doing a great job with your efforts towards quitting smoking - keep going, you can do it!!  If symptoms worsen, or new symptoms arise, please call the clinic or go to the ER.  Please bring all of your medications in a bag to your next visit.  Smoking Cessation Tips 1-800-QUIT-NOW  This document explains the best ways for you to quit smoking and new treatments to help. It lists new medicines that can double or triple your chances of quitting and quitting for good. It also considers ways to avoid relapses and concerns you may have about quitting, including weight gain.   NICOTINE: A POWERFUL ADDICTION:  If you have tried to quit smoking, you know how hard it can be. It is hard because nicotine is a very addictive drug. For some people, it can be as addictive as heroin or cocaine. Usually, people make 2 or 3 tries, or more, before finally being able to quit. Each time you try to quit, you can learn about what helps and what hurts. Quitting takes hard work and a lot of effort, but you can quit smoking.   QUITTING SMOKING IS ONE OF THE MOST IMPORTANT THINGS YOU WILL EVER DO:  You will live longer, feel better, and live better.  The impact on your body of quitting smoking is felt almost immediately:  Within 20 minutes, blood pressure decreases. Pulse returns to its normal level.  After 8 hours, carbon monoxide levels in the blood return to normal. Oxygen level increases.  After 24 hours, chance of heart attack starts to decrease. Breath, hair, and body stop smelling like smoke.  After 48 hours, damaged nerve endings begin to  recover. Sense of taste and smell improve.  After 72 hours, the body is virtually free of nicotine. Bronchial tubes relax and breathing becomes easier.  After 2 to 12 weeks, lungs can hold more air. Exercise becomes easier and circulation improves.  Quitting will lower your chance of having a heart attack, stroke, cancer, or lung disease:  After 1 year, the risk of coronary heart disease is cut in half.  After 5 years, the risk of stroke falls to the same as a nonsmoker.  After 10 years, the risk of lung cancer is cut in half and the risk of other cancers decreases significantly.  After 15 years, the risk of coronary heart disease drops, usually to the level of a nonsmoker.  If you are pregnant, quitting smoking will improve your chances of having a healthy baby.  The people you live with, especially your children, will be healthier.  You will have extra money to spend on things other than cigarettes.   FIVE KEYS TO QUITTING:  Studies have shown that these 5 steps will help you quit smoking and quit for good. You have the best chances of quitting if you use them together:   1. GET READY  Set a quit date.  Change your environment.  Get rid of ALL cigarettes, ashtrays, matches, and lighters in your home, car, and place of work.  Do not let people smoke in your  home.  Review your past attempts to quit. Think about what worked and what did not.  Once you quit, do not smoke. NOT EVEN A PUFF!   2. GET SUPPORT AND ENCOURAGEMENT  Studies have shown that you have a better chance of being successful if you have help. You can get support in many ways. Tell your family, friends, and coworkers that you are going to quit and need their support. Ask them not to smoke around you.  Talk to your caregivers (doctor, dentist, nurse, pharmacist, psychologist, and/or smoking counselor).  Get individual, group, or telephone counseling and support. The more counseling you have, the better your chances are of  quitting. Programs are available at [employer] and health centers. Call your local health department for information about programs in your area.  Spiritual beliefs and practices may help some smokers quit.  Quit meters are Photographer that keep track of quit statistics, such as amount of "quit-time," cigarettes not smoked, and money saved.  Many smokers find one or more of the many self-help books available useful in helping them quit and stay off tobacco.   3. LEARN NEW SKILLS AND BEHAVIORS  Try to distract yourself from urges to smoke. Talk to someone, go for a walk, or occupy your time with a task.  When you first try to quit, change your routine. Take a different route to work. Drink tea instead of coffee. Eat breakfast in a different place.  Do something to reduce your stress. Take a hot bath, exercise, or read a book.  Plan something enjoyable to do every day. Reward yourself for not smoking.  Explore interactive web-based programs that specialize in helping you quit.   4. GET MEDICINE AND USE IT CORRECTLY .  Medicines can help you stop smoking and decrease the urge to smoke. Combining medicine with the above behavioral methods and support can quadruple your chances of successfully quitting smoking.  Talk with your doctor about these options.  5. BE PREPARED FOR RELAPSE OR DIFFICULT SITUATIONS  Most relapses occur within the first 3 months after quitting. Do not be discouraged if you start smoking again. Remember, most people try several times before they finally quit.  You may have symptoms of withdrawal because your body is used to nicotine. You may crave cigarettes, be irritable, feel very hungry, cough often, get headaches, or have difficulty concentrating.  The withdrawal symptoms are only temporary. They are strongest when you first quit, but they will go away within 10 to 14 days.    Here are some difficult situations to watch for:    Alcohol. Avoid drinking alcohol. Drinking lowers your chances of successfully quitting.  Caffeine. Try to reduce the amount of caffeine you consume. It also lowers your chances of successfully quitting.  Other smokers. Being around smoking can make you want to smoke. Avoid smokers.  Weight gain. Many smokers will gain weight when they quit, usually less than 10 pounds. Eat a healthy diet and stay active. Do not let weight gain distract you from your main goal, quitting smoking. Some medicines that help you quit smoking may also help delay weight gain. You can always lose the weight gained after you quit.  Bad mood or depression. There are a lot of ways to improve your mood other than smoking.   If you are having problems with any of these situations, talk to your caregiver.   SPECIAL SITUATIONS OR CONDITIONS:  Studies suggest that everyone can quit  smoking. Your situation or condition can give you a special reason to quit. Pregnant women/New mothers: By quitting, you protect your baby's health and your own.  Hospitalized patients: By quitting, you reduce health problems and help healing.  Heart attack patients: By quitting, you reduce your risk of a second heart attack.  Lung, head, and neck cancer patients: By quitting, you reduce your chance of a second cancer.  Parents of children and adolescents: By quitting, you protect your children from illnesses caused by secondhand smoke.   QUESTIONS TO THINK ABOUT: Think about the following questions before you try to stop smoking. You may want to talk about your answers with your caregiver.  Why do you want to quit?  If you tried to quit in the past, what helped and what did not?  What will be the most difficult situations for you after you quit? How will you plan to handle them?  Who can help you through the tough times? Your family? Friends? Caregiver?  What pleasures do you get from smoking? What ways can you still get pleasure if you quit?    Here are some questions to ask your caregiver:  How can you help me to be successful at quitting?  What medicine do you think would be best for me and how should I take it?  What should I do if I need more help?  What is smoking withdrawal like? How can I get information on withdrawal?   Quitting takes hard work and a lot of effort, but you can quit smoking.   FOR MORE INFORMATION  Smokefree.gov (http://www.davis-sullivan.com/) provides free, accurate, evidence-based information and professional assistance to help support the immediate and long-term needs of people trying to quit smoking.  Document Released: 03/25/2001 Document Re-Released: 09/18/2009  [facility] Patient Information 2011 [place], [place].

## 2011-12-25 NOTE — Assessment & Plan Note (Addendum)
Health Maintenance  Topic Date Due  . Pneumococcal Polysaccharide Vaccine (#1) 05/10/1957  . Pap Smear  05/10/1973  . Influenza Vaccine  01/13/2012  . Mammogram  11/13/2013  . Colonoscopy  10/07/2016  . Tetanus/tdap  06/11/2021    Assessment:  Due for PCV and flu shot  Due for PAP  Plan:  Did not have time to discuss today.  Plan flu shot and PCV vaccination next visit  PAP next visit.  Will order PFTs once her orange card / MAP issue is resolved - will confer with Chauncey Reading for help with this.

## 2011-12-25 NOTE — Progress Notes (Signed)
Subjective:    Patient ID: Grace [name]   Gender: female   DOB: 1955/10/25   Age: 56 y.o.   MRN: [id number]  HPI: [name] is a 56 y.o. with a PMHx of  HTN, HLD, ongoing tobacco abuse, who presented to clinic today for the following:  1) Chronic lumbar back pain - Patient indicates 5 year history of gradually progressing low back pain, worst over the past one year. 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 During the last visit in 08'2013, I suspected that the patient's pain was largely musculoskeletal back pain, with a large contribution of muscle spasm evidence on exam. Therefore, she was started on PRN Flexeril (not requiring TID) and started on Meloxicam (and advised to avoid all additional NSAIDs given historical excessive NSAID usage). At present, this combination is working well for her and she will alternate between Meloxicam OR if not used for several days, may use Advil (but not excessively). Now, one bottle lasting for 1 month instead of 2-3 days. PT has also been working well for her and she is continuing to do exercises at home.   At present, the pain is rated a 3/10 in severity (down from > 10/10 during last visit). Now is intermittent, but otherwise the character continues to be throbbing and occasionally sharp and sharp and spasmy in nature and located over lower back worse on the right side and right hip. Aggravating factors include: prolonged activity, standing upright for 5-10 minutes. Alleviating factors include: Flexeril, Meloxicam, ibuprofen (alternating with the Meloxicam). Patient denies weakness, leg pain, leg weakness, new numbness, new weakness, perianal numbness, paresthesias, fever, history of cancer, history of osteoporosis, history of steroid use.   2) HTN - Patient does not check blood pressure regularly at home. Currently taking Lisinopril-HCTZ 10-12.5 mg. Patient misses doses 0 x per  week on average. denies headaches, dizziness, lightheadedness, chest pain, shortness of breath.  does not request refills today.   3) HLD - currently taking Atorvastatin 80mg , today is her last dose and does not think she will be able to afford the $15 for Lipitor at [facility]. She was changed from Pravastatin 40mg  in 11/2011. Patient misses doses 0 x per week on average. denies chest pain, difficulty breathing, palpitations, tachycardia, and muscle pains. does not request refills today.   4) Hypothyroidism - Patient currently taking Synthroid 125 mcg daily - has been out for 1 week.  Current symptoms: none. Patient denies change in energy level, diarrhea, heat / cold intolerance, nervousness, palpitations and weight changes. Symptoms have been well-controlled.    Review of Systems: Per HPI.   Current Outpatient Medications: Medication Sig  . albuterol (PROVENTIL,VENTOLIN) 90 MCG/ACT inhaler Inhale 2 puffs into the lungs every 6 (six) hours as needed for wheezing.  Marland Kitchen aspirin EC 81 MG tablet Take 81 mg by mouth daily.  Marland Kitchen aspirin-acetaminophen-caffeine (EXCEDRIN MIGRAINE) 250-250-65 MG per tablet Take 2 tablets by mouth every 6 (six) hours as needed.  Marland Kitchen atorvastatin (LIPITOR) 80 MG tablet Take 1 tablet (80 mg total) by mouth daily.  . cyclobenzaprine (FLEXERIL) 10 MG tablet Take 1 tablet (10 mg total) by mouth 3 (three) times daily as needed for muscle spasms (Causes drowsiness, do not take with alcohol, do not drive or operate heavy machinery).  Marland Kitchen esomeprazole (NEXIUM) 40 MG capsule Take 1 capsule (40 mg total) by mouth daily.  Marland Kitchen levothyroxine (SYNTHROID, LEVOTHROID) 125 MCG tablet Take  1 tablet (125 mcg total) by mouth daily.  Marland Kitchen lisinopril-hydrochlorothiazide (PRINZIDE,ZESTORETIC) 10-12.5 MG per tablet Take 1 tablet by mouth daily.  . meloxicam (MOBIC) 15 MG tablet Take 1 tablet (15 mg total) by mouth daily. Do not take  Ibuprofen.  . metoprolol tartrate (LOPRESSOR) 25 MG tablet Take 1 tablet (25  mg total) by mouth 2 (two) times daily.  Marland Kitchen tiotropium (SPIRIVA HANDIHALER) 18 MCG inhalation capsule Place 1 capsule (18 mcg total) into inhaler and inhale daily.    Allergies  Allergen Reactions  . Codeine     REACTION: headaches    Past Medical History  Diagnosis Date  . GERD (gastroesophageal reflux disease)   . Hyperlipidemia   . Hypertension   . Migraine   . COPD (chronic obstructive pulmonary disease)   . Hypothyroidism     with hx of multinodular goiter (noted on neck US in 11/2002 - showing diffuse nodularity and inhomogenous texture diffusely BL)  . Asthma   . Hiatal hernia   . Barrett's esophagus     with high grade dysplasia per endoscopy 05/2005 // followed by Dr. Stan Head ([facility])  . History of CVA (cerebrovascular accident) 10/2000  . Endometrial polyp 03/2003    s/p resection in 03/2003. Path showing submucosal leiomyoma and benign  proliferative type endometrium  . Duodenal ulcer disease     thought to be contributed at least partly by overuse of NSAIDs and salicylates (goody powdr)  . Internal hemorrhoids     grade 2 per colonoscopy in 09/2006 - repeat colonoscopy rec in 5-10 years.  . 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 Fibroid uterus     s/p myomectomy x 2  . Tachycardia   . Nevus   . H/O failed moderate sedation     Past Surgical History  Procedure Date  . Cholecystectomy   . Myomectomy 1990, 1997    x 2 - In 1997, noted to have extensive pelvic adhesions and BL tubal obstruction  . Colonoscopy 02/07/2010  . Upper gastrointestinal endoscopy 12/18/2009  . Hysteroscopy      Objective:    Physical Exam: Filed Vitals:   12/25/11 1500  BP: 102/70  Pulse: 103  Temp: 97.1 F (36.2 C)      General: Vital signs reviewed and noted. Well-developed, well-nourished, in no acute distress; alert, appropriate and cooperative throughout examination.  Head:  Normocephalic, atraumatic.  Eyes: conjunctivae/corneas clear. PERRL.  Nose: Mucous membranes moist, not inflammed, nonerythematous.  Throat: Oropharynx nonerythematous, no exudate appreciated.   Neck: No deformities, masses, or tenderness noted.  Lungs:  Normal respiratory effort. Clear to auscultation BL without crackles or wheezes.  Heart: RRR. S1 and S2 normal without gallop, rubs. No murmur.  Abdomen:  BS normoactive. Soft, Nondistended, non-tender.  No masses or organomegaly.  Extremities: No pretibial edema.    Assessment/ Plan:   Case and plan of care discussed with attending physician, Dr. Lars Mage.

## 2011-12-26 ENCOUNTER — Telehealth: Payer: Self-pay | Admitting: *Deleted

## 2011-12-26 ENCOUNTER — Encounter: Payer: Self-pay | Admitting: Internal Medicine

## 2011-12-26 LAB — BASIC METABOLIC PANEL
BUN: 16 mg/dL (ref 6–23)
CO2: 28 mEq/L (ref 19–32)
Calcium: 9.8 mg/dL (ref 8.4–10.5)
Glucose, Bld: 97 mg/dL (ref 70–99)
Sodium: 141 mEq/L (ref 135–145)

## 2011-12-26 LAB — LIPID PANEL: Total CHOL/HDL Ratio: 3.5 Ratio

## 2011-12-26 NOTE — Telephone Encounter (Signed)
Message copied by Hassan Buckler on Fri Dec 26, 2011 10:02 AM ------      Message from: Priscella Mann      Created: Fri Dec 26, 2011  6:31 AM      Regarding: Lab results       Can you please call the patient and let her know that after starting the Lipitor, her cholesterol is now perfectly at goal. Therefore, she really needs to continue this (her LDL has come down about 100 points), which is very important for her heart health. The Pravastatin was not working for her.            So, it is essential that she gets the stuff sorted out with MAP and Medicaid so that she can get her meds - and/or try to get the Lipitor at [facility] with the coupon as we discussed.            Thank you!            Sincerely,            Johnette Abraham, D.O.             ----- Message -----         From: [facility] Interface         Sent: 12/26/2011  12:11 AM           To: Priscella Mann, DO

## 2011-12-26 NOTE — Telephone Encounter (Signed)
Cholesterol result called to pt and instructed to continue Lipitor per Dr Saralyn Pilar. Pt agreed.

## 2011-12-26 NOTE — Assessment & Plan Note (Addendum)
Assessment: Patient indicates 5 year history of gradually progressing low back pain, worst over the past one year. During the last visit in 08'2013, I suspected that the patient's pain was largely musculoskeletal back pain, with a large contribution of muscle spasm evidence on exam. She has responded well to the PRN Flexeril (not requiring TID) and Meloxicam (and is continuing to avoid duplicating NSAIDs). She also started PT and is continuing to do home exercises independently.  There are no indications of radicular symptoms, and no red flag symptoms.  Plan:      Continue meloxicam once daily - Continue PPI while on therapy. OK to alternate with Advil if needed (separated by at least 1-2 days), but not to be combined or used excessively.  Continue PRN flexeril.  Continue PT services for now.   Patient was advised that she should absolutely avoid all other NSAID therapy (and was provided a list during the last visit).   Patient is advised to return to clinic if symptoms worsen or new symptoms arise.

## 2011-12-26 NOTE — Assessment & Plan Note (Signed)
1. The patient was counseled on the dangers of tobacco use, which include, but are not limited to cardiovascular disease, increased cancer risk of multiple types of cancer, COPD, peripheral vascular disease, strokes. 2. She was also counseled on the benefits of smoking cessation. 3. The patient was firmly advised to quit and referred to a tobacco cessation program.   4. We also reviewed strategies to maximize success, including:  Removing cigarettes and smoking materials from environment  Stress management  Substitution of other forms of reinforcement Support of family/friends.  Selecting a quit date.  Patient provided contact information for 1-800-QUIT-NOW   

## 2011-12-26 NOTE — Assessment & Plan Note (Addendum)
Pertinent Labs: Lab Results  Component Value Date   TSH 1.784 07/31/2011    Assessment: Has been out of her Levothyroxine for approximately 1 week, therefore, would not be appropriate to check TFTs today. There was some issue with her pharmacy ([facility]) because previously, she was getting her RX at [facility], therefore, [facility] no longer carried the medication for her. Now, she has a lapse in her MAP and needs to refill at [facility] with a coupon that I gave her.  Plan:      I called [facility] to clarify that the patient should get her [facility]roxine at [facility] and they said they will fill.  [facility]  Patient encouraged to go to MAP and find out what she needs to do to resume coverage.  Recheck TFTs in approximately 4-6 weeks after resumption of medication.

## 2011-12-26 NOTE — Assessment & Plan Note (Signed)
Pertinent Data: BP Readings from Last 3 Encounters:  12/25/11 102/70  11/13/11 129/86  08/11/11 134/80    Basic Metabolic Panel:    Component Value Date/Time   NA 141 12/25/2011 1636   K 3.8 12/25/2011 1636   CL 101 12/25/2011 1636   CO2 28 12/25/2011 1636   BUN 16 12/25/2011 1636   CREATININE 0.97 12/25/2011 1636   CREATININE 0.82 06/26/2009 1908   GLUCOSE 97 12/25/2011 1636   CALCIUM 9.8 12/25/2011 1636    Assessment: Disease Control: controlled  Progress toward goals: improved  Barriers to meeting goals: no barriers identified    She has reduced her cigarette usage to about 5 a day!    Patient is compliant most of the time with prescribed medications.   Plan:  continue current medications of Metoprolol and Lisinopril-HCTZ

## 2011-12-26 NOTE — Assessment & Plan Note (Signed)
Pertinent Labs:  Ref. Range 11/13/2011   Sodium Latest Range: 135-145 mEq/L 139  Potassium Latest Range: 3.5-5.3 mEq/L 3.8  Chloride Latest Range: 96-112 mEq/L 98  CO2 Latest Range: 19-32 mEq/L 32  BUN Latest Range: 6-23 mg/dL 17  Calcium Latest Range: 8.4-10.5 mg/dL 78.4  Glucose Latest Range: 70-99 mg/dL 96  Alkaline Phosphatase Latest Range: 39-117 U/L 51  Albumin Latest Range: 3.5-5.2 g/dL 4.2  AST Latest Range: 0-37 U/L 36  ALT Latest Range: 0-35 U/L 17  Total Protein Latest Range: 6.0-8.3 g/dL 7.0  Total Bilirubin Latest Range: 0.3-1.2 mg/dL 0.3    Assessment: Previously thought to be secondary to excessive NSAID usage. However, patient has significantly cut down and is only using either Meloxicam or very few NSAIDs and she will alternate days of use.  Plan:      Check BMET today.  Continue to encourage decreased usage of NSAIDS.  Congratulated pt on her efforts to reduce usage.

## 2012-02-06 ENCOUNTER — Other Ambulatory Visit: Payer: Self-pay | Admitting: *Deleted

## 2012-02-06 DIAGNOSIS — I10 Essential (primary) hypertension: Secondary | ICD-10-CM

## 2012-02-06 MED ORDER — LISINOPRIL-HYDROCHLOROTHIAZIDE 10-12.5 MG PO TABS: 1.0000 | ORAL_TABLET | Freq: Every day | ORAL | Status: DC

## 2012-03-25 ENCOUNTER — Telehealth: Payer: Self-pay | Admitting: *Deleted

## 2012-03-25 ENCOUNTER — Other Ambulatory Visit: Payer: Self-pay | Admitting: *Deleted

## 2012-03-25 DIAGNOSIS — E785 Hyperlipidemia, unspecified: Secondary | ICD-10-CM

## 2012-03-25 DIAGNOSIS — E039 Hypothyroidism, unspecified: Secondary | ICD-10-CM

## 2012-03-25 MED ORDER — ATORVASTATIN CALCIUM 80 MG PO TABS: 80.0000 mg | ORAL_TABLET | Freq: Every day | ORAL | Status: DC

## 2012-03-25 MED ORDER — ROSUVASTATIN CALCIUM 40 MG PO TABS: 40.0000 mg | ORAL_TABLET | Freq: Every day | ORAL | Status: DC

## 2012-03-25 MED ORDER — LEVOTHYROXINE SODIUM 125 MCG PO TABS: 125.0000 ug | ORAL_TABLET | Freq: Every day | ORAL | Status: DC

## 2012-03-25 NOTE — Telephone Encounter (Signed)
Pt can no longer afford meds at a retail pharm, she will be using [facility], they can only get crestor no lipitor, would you be willing to change this? Please advise

## 2012-03-25 NOTE — Telephone Encounter (Signed)
Sure, no problem, would be happy to.  Thank you! - Johnette Abraham, D.O., 03/25/2012, 6:23 PM

## 2012-03-25 NOTE — Telephone Encounter (Signed)
Have spoken to health dept pharm

## 2012-03-25 NOTE — Telephone Encounter (Signed)
Please inform pt I am only refilling this once, and she needs to return to clinic for continued refills.

## 2012-03-30 NOTE — Telephone Encounter (Signed)
Called to pharm 

## 2012-04-05 ENCOUNTER — Encounter: Payer: Self-pay | Admitting: Internal Medicine

## 2012-04-14 DIAGNOSIS — Z720 Tobacco use: Secondary | ICD-10-CM

## 2012-04-14 HISTORY — DX: Tobacco use: Z72.0

## 2012-05-12 ENCOUNTER — Other Ambulatory Visit: Payer: Self-pay | Admitting: *Deleted

## 2012-05-12 MED ORDER — ALBUTEROL SULFATE HFA 108 (90 BASE) MCG/ACT IN AERS: 2.0000 | INHALATION_SPRAY | Freq: Four times a day (QID) | RESPIRATORY_TRACT | Status: DC | PRN

## 2012-05-12 NOTE — Telephone Encounter (Signed)
Fax from [facility] - requesting refill on Proventil inhale 2 puffs by mouth q6hr as needed for wheezing. Qty#6 inhalers. Last dispensed 03/08/2012. Thanks

## 2012-05-16 ENCOUNTER — Other Ambulatory Visit: Payer: Self-pay | Admitting: Internal Medicine

## 2012-05-20 ENCOUNTER — Encounter: Payer: Self-pay | Admitting: Internal Medicine

## 2012-05-20 ENCOUNTER — Other Ambulatory Visit: Payer: Self-pay | Admitting: *Deleted

## 2012-05-20 ENCOUNTER — Ambulatory Visit (INDEPENDENT_AMBULATORY_CARE_PROVIDER_SITE_OTHER): Payer: No Typology Code available for payment source | Admitting: Internal Medicine

## 2012-05-20 VITALS — BP 157/105 | HR 98 | Temp 97.0°F | Ht 63.0 in | Wt 207.5 lb

## 2012-05-20 DIAGNOSIS — K2270 Barrett's esophagus without dysplasia: Secondary | ICD-10-CM

## 2012-05-20 DIAGNOSIS — J449 Chronic obstructive pulmonary disease, unspecified: Secondary | ICD-10-CM

## 2012-05-20 DIAGNOSIS — Z0000 Encounter for general adult medical examination without abnormal findings: Secondary | ICD-10-CM

## 2012-05-20 DIAGNOSIS — G8929 Other chronic pain: Secondary | ICD-10-CM

## 2012-05-20 DIAGNOSIS — E039 Hypothyroidism, unspecified: Secondary | ICD-10-CM

## 2012-05-20 DIAGNOSIS — M545 Low back pain: Secondary | ICD-10-CM

## 2012-05-20 DIAGNOSIS — E785 Hyperlipidemia, unspecified: Secondary | ICD-10-CM

## 2012-05-20 DIAGNOSIS — Z598 Other problems related to housing and economic circumstances: Secondary | ICD-10-CM

## 2012-05-20 DIAGNOSIS — K22719 Barrett's esophagus with dysplasia, unspecified: Secondary | ICD-10-CM

## 2012-05-20 DIAGNOSIS — Z720 Tobacco use: Secondary | ICD-10-CM

## 2012-05-20 DIAGNOSIS — Z23 Encounter for immunization: Secondary | ICD-10-CM

## 2012-05-20 DIAGNOSIS — F17200 Nicotine dependence, unspecified, uncomplicated: Secondary | ICD-10-CM

## 2012-05-20 DIAGNOSIS — I10 Essential (primary) hypertension: Secondary | ICD-10-CM

## 2012-05-20 MED ORDER — TIOTROPIUM BROMIDE MONOHYDRATE 18 MCG IN CAPS: 18.0000 ug | ORAL_CAPSULE | Freq: Every day | RESPIRATORY_TRACT | Status: DC

## 2012-05-20 MED ORDER — CYCLOBENZAPRINE HCL 10 MG PO TABS: 10.0000 mg | ORAL_TABLET | Freq: Three times a day (TID) | ORAL | Status: DC | PRN

## 2012-05-20 MED ORDER — ESOMEPRAZOLE MAGNESIUM 40 MG PO CPDR: 40.0000 mg | DELAYED_RELEASE_CAPSULE | Freq: Every day | ORAL | Status: DC

## 2012-05-20 MED ORDER — MELOXICAM 15 MG PO TABS: 15.0000 mg | ORAL_TABLET | Freq: Every day | ORAL | Status: AC

## 2012-05-20 NOTE — Assessment & Plan Note (Addendum)
Health Maintenance  Topic Date Due  . Pneumococcal Polysaccharide Vaccine (#1) 05/10/1957  . Pap Smear  05/10/1973  . Influenza Vaccine  12/14/2011  . Mammogram  11/13/2013  . Colonoscopy  10/07/2016  . Tetanus/tdap  06/11/2021    Assessment:  Procedures due: PAP, PFT  Labs due: TFTs  Immunizations due: PCV, flu shot  Plan:  PFTs ordered, PCV and flu shot given today.  Defer TFTs today given that she has been off of her Synthroid for 4 months.   Needs an EGD.

## 2012-05-20 NOTE — Assessment & Plan Note (Signed)
Pertinent Data: BP Readings from Last 3 Encounters:  05/20/12 157/105  12/25/11 102/70  11/13/11 129/86    Basic Metabolic Panel:    Component Value Date/Time   NA 141 12/25/2011 1636   K 3.8 12/25/2011 1636   CL 101 12/25/2011 1636   CO2 28 12/25/2011 1636   BUN 16 12/25/2011 1636   CREATININE 0.97 12/25/2011 1636   CREATININE 0.82 06/26/2009 1908   GLUCOSE 97 12/25/2011 1636   CALCIUM 9.8 12/25/2011 1636    Assessment: Disease Control:   not controlled   Progress toward goals:   deteriorated   Barriers to meeting goals: nonadherence to medications - has been out of her metoprolol for the past 3 weeks at least. Although, it has been sent to her pharmacy already     Patient is noncompliant some of the time with prescribed medications.   Plan:  Blood pressure remains elevated today, however likely secondary to not taking her blood pressure medications as prescribed.   continue current medications  Educational resources provided:    Self management tools provided:

## 2012-05-20 NOTE — Assessment & Plan Note (Addendum)
Assessment: Has had significant tobacco abuse history, however has not smoked for the past 11 days. Does not have any PFTs within our system.  Plan:      Ordered PFTs.   Congratulated on tobacco cessation efforts.

## 2012-05-20 NOTE — Telephone Encounter (Signed)
rx called in

## 2012-05-20 NOTE — Patient Instructions (Addendum)
General Instructions:  Please follow-up at the clinic in 1 month, at which time we will reevaluate your blood pressure, depression - OR, please follow-up in the clinic sooner if needed.  There have not been changes in your medications:  I am refilling the Flexeril and Meloxicam for your back pain  We will call the health department regarding what medications you need to have refilled and get them sent in..    If you or having worsening of her back pain symptoms, come in sooner for evaluation or go to the ER.   If you have been started on new medication(s), and you develop symptoms concerning for allergic reaction, including, but not limited to, throat closing, tongue swelling, rash, please stop the medication immediately and call the clinic at [phone], and go to the ER.  If symptoms worsen, or new symptoms arise, please call the clinic or go to the ER.  PLEASE BRING ALL OF YOUR MEDICATIONS  IN A BAG TO YOUR NEXT APPOINTMENT   Treatment Goals:  Goals (1 Years of Data) as of 05/20/12         12/25/11 11/13/11 12/05/10     Result Component    . LDL CALC < 100  58 164 111

## 2012-05-20 NOTE — Assessment & Plan Note (Signed)
Pertinent Labs: Liver Function Tests:    Component Value Date/Time   AST 36 11/13/2011 1132   ALT 17 11/13/2011 1132   ALKPHOS 51 11/13/2011 1132   BILITOT 0.3 11/13/2011 1132   PROT 7.0 11/13/2011 1132   ALBUMIN 4.2 11/13/2011 1132    Lipid Panel:     Component Value Date/Time   CHOL 149 12/25/2011 1636   TRIG 247* 12/25/2011 1636   HDL 42 12/25/2011 1636   CHOLHDL 3.5 12/25/2011 1636   VLDL 49* 12/25/2011 1636   LDLCALC 58 12/25/2011 1636     Assessment: Goal LDL (per ATP guidelines): < 100 mg/dL  Disease Control: controlled  Progress toward goals: at goal  Barriers to meeting goals: nonadherence to medications - has not taken her statin for approximately the last 4 months. Has been waiting on her pharmacy since December 2013.     Patient is noncompliant some of the time with prescribed medications.    Plan:  continue current medications  New Rx was sent to her pharmacy

## 2012-05-20 NOTE — Assessment & Plan Note (Signed)
  Assessment: 1. The patient has not smoked for the past 11 days.  2. The patient was counseled on the dangers of tobacco use, which include, but are not limited to cardiovascular disease, increased cancer risk of multiple types of cancer, COPD, peripheral vascular disease, strokes. 3. She was also graduated on her efforts towards smoking cessation on the benefits of smoking cessation. 4. Progress toward smoking cessation:    5. Barriers to progress toward smoking cessation:     Plan: 1. Instruction/counseling given: The patient was advised to continue her efforts towards smoking cessation.

## 2012-05-20 NOTE — Progress Notes (Signed)
Patient: Grace [name]   MRN: [id number]  DOB: [birth year]  PCP: Alexandria Lodge, DO   Subjective:    HPI: Ms. Grace [name] is a 57 y.o. female with a PMHx of  HTN, HLD, ongoing tobacco abuse, who presented to clinic today for the following:  1) Chronic lumbar back pain - Patient indicates 5 year history of gradually progressing low back pain, worst over the past one year. 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 Was previously tried on Flexeril and Meloxicam and PT, has not been taking the medications regularly. Previously worked well for her. Denies leg weakness, shooting pains down her leg.   2) HTN - Patient does not check blood pressure regularly at home. Currently taking Lisinopril-HCTZ 10-12.5 mg. Has been out of metoprolol x 3 weeks because was unable to pick it up. Denies headaches, dizziness, lightheadedness, chest pain, shortness of breath.  does not request refills today.   3) HLD - currently supposed to be taking Crestor - per [facility], but has not gotten. Rx sent in 03/25/2012. She was changed from Pravastatin 40mg  in 11/2011. Denies chest pain, difficulty breathing, palpitations, tachycardia, and muscle pains. does request refills today.   4) Hypothyroidism - has not been able to afford the synthroid - has been out of medication x 4 months, since last visit. Confirms recent increased depression, and decreased energy level. Denies diarrhea, heat / cold intolerance, nervousness, palpitations and weight changes.   5) Tobacco abuse - has not been smoking x 11 days (since May 10, 2012). Prior to which she smoked since age 51, prior 2-3 ppd, and most recently 1/4-1/2 ppd for last several years. Has quit cold [place].   6) Preventative Care - patient is due for flu shot, PCV, PAP smear, and PFTs.   Review of Systems: Per HPI.   Current Outpatient Medications: Medication Sig  . albuterol (PROVENTIL  HFA;VENTOLIN HFA) 108 (90 BASE) MCG/ACT inhaler Inhale 2 puffs into the lungs every 6 (six) hours as needed for wheezing.  Marland Kitchen aspirin EC 81 MG tablet Take 81 mg by mouth daily.  Marland Kitchen lisinopril-hydrochlorothiazide (PRINZIDE,ZESTORETIC) 10-12.5 MG per tablet Take 1 tablet by mouth daily.  Marland Kitchen albuterol (PROVENTIL,VENTOLIN) 90 MCG/ACT inhaler Inhale 2 puffs into the lungs every 6 (six) hours as needed for wheezing.  . cyclobenzaprine (FLEXERIL) 10 MG tablet Take 1 tablet (10 mg total) by mouth 3 (three) times daily as needed for muscle spasms (Causes drowsiness, do not take with alcohol, do not drive or operate heavy machinery).  Marland Kitchen esomeprazole (NEXIUM) 40 MG capsule Take 1 capsule (40 mg total) by mouth daily.  Marland Kitchen levothyroxine (SYNTHROID, LEVOTHROID) 125 MCG tablet Take 1 tablet (125 mcg total) by mouth daily.  . meloxicam (MOBIC) 15 MG tablet Take 1 tablet (15 mg total) by mouth daily. Do not take  Ibuprofen.  . metoprolol tartrate (LOPRESSOR) 25 MG tablet TAKE ONE TABLET BY MOUTH TWICE DAILY  . rosuvastatin (CRESTOR) 40 MG tablet Take 1 tablet (40 mg total) by mouth daily.  Marland Kitchen tiotropium (SPIRIVA HANDIHALER) 18 MCG inhalation capsule Place 1 capsule (18 mcg total) into inhaler and inhale daily.    Allergies  Allergen Reactions  . Codeine     REACTION: headaches    Past Medical History  Diagnosis Date  . GERD (gastroesophageal reflux disease)   . Hyperlipidemia   . Hypertension   . Migraine   . COPD (chronic obstructive  pulmonary disease)   . Hypothyroidism     with hx of multinodular goiter (noted on neck US in 11/2002 - showing diffuse nodularity and inhomogenous texture diffusely BL)  . Asthma   . Hiatal hernia   . Barrett's esophagus     with high grade dysplasia per endoscopy 05/2005 // followed by Dr. Stan Head ([facility])  . History of CVA (cerebrovascular accident) 10/2000  . Endometrial polyp 03/2003    s/p resection in 03/2003. Path showing submucosal leiomyoma and benign   proliferative type endometrium  . Duodenal ulcer disease     thought to be contributed at least partly by overuse of NSAIDs and salicylates (goody powdr)  . Internal hemorrhoids     grade 2 per colonoscopy in 09/2006 - repeat colonoscopy rec in 5-10 years.  . 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 Fibroid uterus     s/p myomectomy x 2  . Tachycardia   . Nevus   . H/O failed moderate sedation   . Personal history of failed conscious sedation 08/11/2011    Past Surgical History  Procedure Date  . Cholecystectomy   . Myomectomy 1990, 1997    x 2 - In 1997, noted to have extensive pelvic adhesions and BL tubal obstruction  . Colonoscopy 02/07/2010  . Upper gastrointestinal endoscopy 12/18/2009  . Hysteroscopy      Objective:    Physical Exam: Filed Vitals:   05/20/12 1437  BP: 157/105  Pulse: 98  Temp: 97 F (36.1 C)     General: Vital signs reviewed and noted. Well-developed, well-nourished, in no acute distress; alert, appropriate and cooperative throughout examination.  Head: Normocephalic, atraumatic.  Eyes: conjunctivae/corneas clear. PERRL.  Lungs:  Normal respiratory effort. Clear to auscultation BL without crackles or wheezes.  Heart: RRR. S1 and S2 normal without gallop, rubs. No murmur.  Abdomen:  BS normoactive. Soft, Nondistended, non-tender.  No masses or organomegaly.  Extremities: No pretibial edema. Negative straight leg raise. Muscle strength 5/5 BL LE and UE. Normal sensation to light touch BL.    Assessment/ Plan:   The patient's case and plan of care was discussed with attending physician, Dr. Lars Mage

## 2012-05-21 ENCOUNTER — Other Ambulatory Visit: Payer: Self-pay | Admitting: *Deleted

## 2012-05-21 DIAGNOSIS — E039 Hypothyroidism, unspecified: Secondary | ICD-10-CM

## 2012-05-24 MED ORDER — LEVOTHYROXINE SODIUM 125 MCG PO TABS: 125.0000 ug | ORAL_TABLET | Freq: Every day | ORAL | Status: DC

## 2012-05-24 NOTE — Telephone Encounter (Signed)
Called to pharm 

## 2012-05-27 ENCOUNTER — Encounter (HOSPITAL_COMMUNITY): Payer: No Typology Code available for payment source

## 2012-05-31 DIAGNOSIS — Z598 Other problems related to housing and economic circumstances: Secondary | ICD-10-CM | POA: Insufficient documentation

## 2012-05-31 NOTE — Assessment & Plan Note (Signed)
Pertinent Data: EGD (12/2009) - 1. Intestinal metaplasia consistent with Barrett's esophagus with low grade glandular dysplasia in one focus of one fragment. 2. A Warthin-Starry stain is negative for H. pylori.  Assessment: Patient has know chronic duodenal ulcer and Barrett's esophagus with low grade dysplasia. She was supposed to have EGD in 08/2011, which was cancelled for unknown reason. She will need repeat EGD now. She has stopped her excessive NSAID use, but has not been on her Nexium regularly.  Plan:      Will inform pt she needs to follow-up with Dr. Leone Payor for repeat EGD.  Refill of her nexium sent to [facility].  Encouraged to continue prudent use of NSAIDs.

## 2012-05-31 NOTE — Assessment & Plan Note (Signed)
Assessment: Patient indicates 5 year history of gradually progressing low back pain, worst over the past one year. During the last visit in 11/2011, I suspected that the patient's pain was largely musculoskeletal back pain, with a large contribution of muscle spasm evidence on exam. She has responded well to the PRN Flexeril (not requiring TID) and Meloxicam (and is continuing to avoid duplicating NSAIDs). She has not been taking the medications regularly recently, and therefore, has been having recurrence of the pain.   She denies any perineal numbness or tingling, bladder/stool incontinence, shooting radicular pains down her legs, leg weakness.   Plan:      Continue meloxicam once daily - Continue PPI while on therapy. OK to alternate with Advil if needed (separated by at least 1-2 days), but not to be combined or used excessively.  Continue PRN flexeril.  Patient was advised that she should absolutely avoid all other NSAID therapy (and was provided a list during the last visit).   Patient informed of the red flag symptoms that should prompt her immediate return for reevaluation - such as, but not limited to the following: perineal numbness or tingling, bladder/stool incontinence, shooting radicular pains down her legs, leg weakness. . She expresses understanding of the information provided.

## 2012-05-31 NOTE — Assessment & Plan Note (Addendum)
Pertinent Labs: Lab Results  Component Value Date   TSH 1.784 07/31/2011   FREET4 1.44 06/12/2011    Assessment: Patient is currently on Levothyroxine 125 mcg daily, which she has been out of for the last 4 months because of cost. She is symptomatic at this time with symptoms of depression (without SI/HI).  Plan:      continue current medications  Will refill to [facility], where medication should be less expensive if still on the $4 list.  Plan recheck TFTs at next visit (if she has started being compliant with her medication)

## 2012-05-31 NOTE — Assessment & Plan Note (Signed)
Assessment: Has had difficulty with finding a job. 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  Plan:      SW referral to aide in job resources.

## 2012-06-04 ENCOUNTER — Telehealth: Payer: Self-pay | Admitting: Licensed Clinical Social Worker

## 2012-06-04 ENCOUNTER — Ambulatory Visit (HOSPITAL_COMMUNITY)
Admission: RE | Admit: 2012-06-04 | Discharge: 2012-06-04 | Disposition: A | Discharge status: Home / Self Care | Payer: No Typology Code available for payment source | Source: Ambulatory Visit | Attending: Internal Medicine | Admitting: Internal Medicine

## 2012-06-04 DIAGNOSIS — J4489 Other specified chronic obstructive pulmonary disease: Secondary | ICD-10-CM | POA: Insufficient documentation

## 2012-06-04 MED ORDER — ALBUTEROL SULFATE (5 MG/ML) 0.5% IN NEBU
2.5000 mg | INHALATION_SOLUTION | Freq: Once | RESPIRATORY_TRACT | Status: AC
  Administered 2012-06-04: 2.5 mg via RESPIRATORY_TRACT

## 2012-06-04 NOTE — Telephone Encounter (Signed)
Grace [name] was referred to CSW for assistance with vocational resources.  Pt also voices concern with ability to afford medications.  CSW placed called to pt.  CSW left message with pt's mother requesting return call. CSW provided contact hours and phone number. CSW will refer Grace [name] to: [employer], [employer], and [employer].  Pt's medications are available thru [facility] or $4.  CSW will discuss application to [facility] as an option.

## 2012-06-11 NOTE — Telephone Encounter (Signed)
Pt is aware of [employer] and [employer].  Pt outside of age range for [employer] [place] [employer].  Pt can obtain medications through [facility], synthroid and Spiriva.  Pt maintains she use to receive Prilosec, but now does not take anything and can not afford Prilosec.  Pt needs to recertify with [facility].  CSW informed Ms. Kimbell [employer] is similar to [facility] and since [facility] is local, best to recert with [facility].  Currently, [facility] meets pt's needs.  Pt states she has applied for disability and has an appt with [employer] disability physician next week.  Pt continues to have back pain when standing or sitting for a length of time.  Pt aware of her appt next week in [facility] and denies add'l needs at this time.  Pt has CSW contact information.  CSW will sign off at this time.

## 2012-06-17 ENCOUNTER — Encounter: Payer: Self-pay | Admitting: Internal Medicine

## 2012-06-17 ENCOUNTER — Ambulatory Visit (INDEPENDENT_AMBULATORY_CARE_PROVIDER_SITE_OTHER): Payer: No Typology Code available for payment source | Admitting: Internal Medicine

## 2012-06-17 VITALS — BP 119/82 | HR 88 | Temp 97.2°F | Ht 63.0 in | Wt 212.8 lb

## 2012-06-17 DIAGNOSIS — I10 Essential (primary) hypertension: Secondary | ICD-10-CM

## 2012-06-17 DIAGNOSIS — E039 Hypothyroidism, unspecified: Secondary | ICD-10-CM

## 2012-06-17 DIAGNOSIS — K2270 Barrett's esophagus without dysplasia: Secondary | ICD-10-CM

## 2012-06-17 MED ORDER — LEVOTHYROXINE SODIUM 125 MCG PO TABS: 125.0000 ug | ORAL_TABLET | Freq: Every day | ORAL | Status: DC

## 2012-06-17 NOTE — Progress Notes (Signed)
Subjective:     Patient ID: Grace [name], female   DOB: May 31, 1955, 57 y.o.   MRN: [id number]  HPI The patient is a 57 YO female who was recently seen in the clinic and comes back for a follow up. She has PMH of HTN, barrett's esophagus, hypothyroidism, COPD. She had not been taking any of her medicines for some time at last visit and has been able to get some of her blood pressure medicines now however has not gotten her synthroid. She has not been taking it for about 5 months now. She needs to renew her MAP and get her medicines however I advised today that she call pharmacies to see where synthroid is $4 and get some until [facility] comes through. She is not having any new complaints or problems. Her breathing has been good although she has been out of spiriva for some time as well. She has not followed up with Dr. Leone Payor for EGD to check on her Barrett's esophagus. No complaints. She is working on getting disability. She has been gaining weight.   Review of Systems  Constitutional: Positive for fatigue. Negative for fever, chills, diaphoresis, activity change, appetite change and unexpected weight change.       Weight gain.  HENT: Negative.   Eyes: Negative.   Respiratory: Negative for cough, choking, shortness of breath and wheezing.   Cardiovascular: Negative for chest pain, palpitations and leg swelling.  Gastrointestinal: Negative.  Negative for nausea, vomiting, abdominal pain and diarrhea.  Endocrine: Negative for cold intolerance, heat intolerance, polydipsia, polyphagia and polyuria.  Musculoskeletal: Positive for back pain. Negative for myalgias, joint swelling, arthralgias and gait problem.  Skin: Negative.   Neurological: Negative.   Psychiatric/Behavioral: Negative.        Objective:   Physical Exam  Constitutional: She is oriented to person, place, and time. She appears well-developed and well-nourished.  Obese  HENT:  Head: Normocephalic and atraumatic.  Eyes: EOM are  normal. Pupils are equal, round, and reactive to light.  Neck: Normal range of motion. Neck supple. No JVD present. No tracheal deviation present. No thyromegaly present.  Cardiovascular: Normal rate and normal heart sounds.   No murmur heard. Pulmonary/Chest: Effort normal and breath sounds normal. No respiratory distress. She has no wheezes. She has no rales. She exhibits no tenderness.  Abdominal: Soft. Bowel sounds are normal. She exhibits no distension. There is no tenderness. There is no rebound.  Musculoskeletal: Normal range of motion. She exhibits tenderness. She exhibits no edema.  Back pain, lumbar, chronic.  Lymphadenopathy:    She has no cervical adenopathy.  Neurological: She is alert and oriented to person, place, and time. No cranial nerve deficit.  Skin: Skin is warm and dry. No rash noted. No erythema. No pallor.       Assessment/Plan:   1. HTN - Much better controlled on medicines and continue.   2. Hypothyroidism - Plan per PCP was to recheck once back on medicines and will have patient fill medicines and return about 2 months after restarting for labs. Counseled her extensively (~5 minutes) about risks of not using her medicine and possible adverse outcomes such as heart failure.   3. Barrett's esophagus - Advised patient strongly to return for repeat EGD to check on this.   4. Disposition - The patient will return in 3 months and was strongly advised to restart her medication for hypothyroidism. Physical rx given for synthroid.

## 2012-06-17 NOTE — Patient Instructions (Signed)
We would like you to go back to Dr. Leone Payor to get your esophagus checked.  Please fill your synthroid and start taking it again.  Come back in about 3 months. If you have problems before then our number is [phone].

## 2012-10-22 ENCOUNTER — Other Ambulatory Visit: Payer: Self-pay | Admitting: *Deleted

## 2012-10-22 MED ORDER — ALBUTEROL SULFATE HFA 108 (90 BASE) MCG/ACT IN AERS: 2.0000 | INHALATION_SPRAY | Freq: Four times a day (QID) | RESPIRATORY_TRACT | Status: DC | PRN

## 2012-10-22 NOTE — Telephone Encounter (Signed)
Albuterol rx faxed to [facility].

## 2012-10-22 NOTE — Telephone Encounter (Signed)
Talked with mother at home - pt to call clinic. ? Ins - [employer]. Jordan Hawks was concerned about cost of med if she had to pay out of pocket.

## 2012-11-08 ENCOUNTER — Other Ambulatory Visit: Payer: Self-pay | Admitting: *Deleted

## 2012-11-08 MED ORDER — METOPROLOL TARTRATE 25 MG PO TABS: 25.0000 mg | ORAL_TABLET | Freq: Two times a day (BID) | ORAL | Status: DC

## 2012-12-23 ENCOUNTER — Encounter: Payer: Self-pay | Admitting: Internal Medicine

## 2013-02-03 ENCOUNTER — Other Ambulatory Visit: Payer: Self-pay | Admitting: *Deleted

## 2013-02-03 DIAGNOSIS — I10 Essential (primary) hypertension: Secondary | ICD-10-CM

## 2013-02-06 MED ORDER — LISINOPRIL-HYDROCHLOROTHIAZIDE 10-12.5 MG PO TABS: 1.0000 | ORAL_TABLET | Freq: Every day | ORAL | Status: DC

## 2013-02-07 NOTE — Telephone Encounter (Signed)
Message set to front desk for an appt.

## 2013-03-15 ENCOUNTER — Ambulatory Visit (INDEPENDENT_AMBULATORY_CARE_PROVIDER_SITE_OTHER): Payer: Self-pay | Admitting: Internal Medicine

## 2013-03-15 ENCOUNTER — Encounter: Payer: Self-pay | Admitting: Internal Medicine

## 2013-03-15 ENCOUNTER — Other Ambulatory Visit (HOSPITAL_COMMUNITY)
Admission: RE | Admit: 2013-03-15 | Discharge: 2013-03-15 | Disposition: A | Discharge status: Home / Self Care | Payer: Self-pay | Source: Ambulatory Visit | Attending: Internal Medicine | Admitting: Internal Medicine

## 2013-03-15 VITALS — BP 121/80 | HR 91 | Temp 97.5°F | Ht 63.0 in | Wt 210.7 lb

## 2013-03-15 DIAGNOSIS — Z01419 Encounter for gynecological examination (general) (routine) without abnormal findings: Secondary | ICD-10-CM | POA: Insufficient documentation

## 2013-03-15 DIAGNOSIS — Z1151 Encounter for screening for human papillomavirus (HPV): Secondary | ICD-10-CM | POA: Insufficient documentation

## 2013-03-15 DIAGNOSIS — E785 Hyperlipidemia, unspecified: Secondary | ICD-10-CM

## 2013-03-15 DIAGNOSIS — J45909 Unspecified asthma, uncomplicated: Secondary | ICD-10-CM

## 2013-03-15 DIAGNOSIS — E039 Hypothyroidism, unspecified: Secondary | ICD-10-CM

## 2013-03-15 DIAGNOSIS — Z23 Encounter for immunization: Secondary | ICD-10-CM

## 2013-03-15 DIAGNOSIS — Z139 Encounter for screening, unspecified: Secondary | ICD-10-CM

## 2013-03-15 DIAGNOSIS — I10 Essential (primary) hypertension: Secondary | ICD-10-CM

## 2013-03-15 DIAGNOSIS — M545 Low back pain: Secondary | ICD-10-CM

## 2013-03-15 DIAGNOSIS — Z0000 Encounter for general adult medical examination without abnormal findings: Secondary | ICD-10-CM

## 2013-03-15 LAB — LIPID PANEL
HDL: 44 mg/dL (ref >39)
Total CHOL/HDL Ratio: 5.1 Ratio
VLDL: 57 mg/dL — ABNORMAL HIGH (ref 0–40)

## 2013-03-15 LAB — BASIC METABOLIC PANEL WITH GFR
BUN: 16 mg/dL (ref 6–23)
CO2: 32 mEq/L (ref 19–32)
Chloride: 100 mEq/L (ref 96–112)
GFR, Est Non African American: 74 mL/min
Potassium: 4 mEq/L (ref 3.5–5.3)

## 2013-03-15 MED ORDER — LEVOTHYROXINE SODIUM 125 MCG PO TABS: 125.0000 ug | ORAL_TABLET | Freq: Every day | ORAL | Status: DC

## 2013-03-15 MED ORDER — METOPROLOL TARTRATE 25 MG PO TABS: 25.0000 mg | ORAL_TABLET | Freq: Two times a day (BID) | ORAL | Status: DC

## 2013-03-15 MED ORDER — ESOMEPRAZOLE MAGNESIUM 40 MG PO CPDR: 40.0000 mg | DELAYED_RELEASE_CAPSULE | Freq: Every day | ORAL | Status: AC

## 2013-03-15 MED ORDER — ROSUVASTATIN CALCIUM 40 MG PO TABS: 40.0000 mg | ORAL_TABLET | Freq: Every day | ORAL | Status: DC

## 2013-03-15 MED ORDER — LISINOPRIL-HYDROCHLOROTHIAZIDE 10-12.5 MG PO TABS: 1.0000 | ORAL_TABLET | Freq: Every day | ORAL | Status: DC

## 2013-03-15 MED ORDER — CYCLOBENZAPRINE HCL 10 MG PO TABS: 10.0000 mg | ORAL_TABLET | Freq: Three times a day (TID) | ORAL | Status: AC | PRN

## 2013-03-15 NOTE — Assessment & Plan Note (Signed)
Blood pressure today- well controlled.  Vitals - 1 value per visit 03/15/2013 06/17/2012 05/20/2012  SYSTOLIC 121 119 161  DIASTOLIC 80 82 105  12/25/2011-BMP-  Na- 141, K- 3.8, Cr- 0.97  Disease control- At Goal. Complaint with medications- Lisinopril-HCTZ- 10-12.5mg  daily, Lopressor- 25mg  BID.  Plan- Cont current regimen. - BMp check today.

## 2013-03-15 NOTE — Progress Notes (Signed)
Patient ID: Grace [name], female   DOB: 02-10-1956, 57 y.o.   MRN: [id number]   Subjective:   Patient ID: Grace [name] female   DOB: 01/26/1956 57 y.o.   MRN: [id number]  HPI: [name] is a 57 y.o. with PMH of Migraines, HTN, COPD, Asthma, hypothyr, HLD and chronic low back pain. Presented today for routine follow up visit. No complaints today. Wants refills of some of her medications. Please see problem based chatting for review of her chronic medical conditions.    Past Medical History  Diagnosis Date  . GERD (gastroesophageal reflux disease)   . Hyperlipidemia   . Hypertension   . Migraine   . COPD (chronic obstructive pulmonary disease)   . Hypothyroidism     with hx of multinodular goiter (noted on neck US in 11/2002 - showing diffuse nodularity and inhomogenous texture diffusely BL)  . Asthma   . Hiatal hernia   . Barrett's esophagus     with high grade dysplasia per endoscopy 05/2005 // followed by Dr. Stan Head ([facility])  . History of CVA (cerebrovascular accident) 10/2000  . Endometrial polyp 03/2003    s/p resection in 03/2003. Path showing submucosal leiomyoma and benign  proliferative type endometrium  . Duodenal ulcer disease     thought to be contributed at least partly by overuse of NSAIDs and salicylates (goody powdr)  . Internal hemorrhoids     grade 2 per colonoscopy in 09/2006 - repeat colonoscopy rec in 5-10 years.  . 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 Fibroid uterus     s/p myomectomy x 2  . Tachycardia   . Nevus   . H/O failed moderate sedation   . Personal history of failed conscious sedation 08/11/2011   Current Outpatient Prescriptions  Medication Sig Dispense Refill  . albuterol (PROVENTIL HFA;VENTOLIN HFA) 108 (90 BASE) MCG/ACT inhaler Inhale 2 puffs into the lungs every 6 (six) hours as needed for wheezing.  6 Inhaler  1  . aspirin EC 81  MG tablet Take 81 mg by mouth daily.      . cyclobenzaprine (FLEXERIL) 10 MG tablet Take 1 tablet (10 mg total) by mouth 3 (three) times daily as needed for muscle spasms (Causes drowsiness, do not take with alcohol, do not drive or operate heavy machinery).  90 tablet  1  . esomeprazole (NEXIUM) 40 MG capsule Take 1 capsule (40 mg total) by mouth daily.  30 capsule  1  . levothyroxine (SYNTHROID, LEVOTHROID) 125 MCG tablet Take 1 tablet (125 mcg total) by mouth daily.  30 tablet  1  . lisinopril-hydrochlorothiazide (PRINZIDE,ZESTORETIC) 10-12.5 MG per tablet Take 1 tablet by mouth daily.  90 tablet  3  . meloxicam (MOBIC) 15 MG tablet Take 1 tablet (15 mg total) by mouth daily. Do not take  Ibuprofen.  30 tablet  1  . metoprolol tartrate (LOPRESSOR) 25 MG tablet Take 1 tablet (25 mg total) by mouth 2 (two) times daily.  60 tablet  4  . rosuvastatin (CRESTOR) 40 MG tablet Take 1 tablet (40 mg total) by mouth daily.  30 tablet  5  . tiotropium (SPIRIVA HANDIHALER) 18 MCG inhalation capsule Place 1 capsule (18 mcg total) into inhaler and inhale daily.  30 capsule  2  . [DISCONTINUED] ipratropium (ATROVENT HFA) 17 MCG/ACT inhaler Inhale 2 puffs into the lungs  4 (four) times daily.  1 Inhaler  6   No current facility-administered medications for this visit.   Family History  Problem Relation Age of Onset  . COPD Mother   . Heart disease Father     had 5 heart attacks, first at age 31s.  Marland Kitchen COPD Father    History   Social History  . Marital Status: Divorced    Spouse Name: N/A    Number of Children: N/A  . Years of Education: N/A   Social History Main Topics  . Smoking status: Former Smoker -- 0.50 packs/day for 42 years    Types: Cigarettes    Quit date: 05/09/2012  . Smokeless tobacco: Never Used     Comment: Counseling sheet [id number]   . Alcohol Use: No  . Drug Use: No  . Sexual Activity: None   Other Topics Concern  . None   Social History Narrative         Financial  assistance approved for 100% discount at [facility] and has [facility] card per [employer]   01/15/2010            Review of Systems: CONSTITUTIONAL- No Fever, weightloss, night sweat, or change in appetite. SKIN- No Rash, colour changes, or itching. HEAD- No Headache, mild occasional dizziness when she wakes up, for about 5 secs, and resolves, not present during the day. RESPIRATORY- No Cough, or SOB. CARDIAC- No Palpitations, DOE, PND,or chest pain. GI- No vomiting, diarrhoea, constipation, abd pain. URINARY- No Frequency, or dysuria. NEUROLOGIC- No Numbness, syncope, or burning.   Objective:  Physical Exam: Filed Vitals:   03/15/13 1327  BP: 121/80  Pulse: 91  Temp: 97.5 F (36.4 C)  TempSrc: Oral  Height: 5\' 3"  (1.6 m)  Weight: 210 lb 11.2 oz (95.573 kg)  SpO2: 97%   GENERAL- alert, co-operative, appears as stated age, not in any distress. HEENT- Atraumatic, normocephalic, PERRL, EOMI, oral mucosa appears moist, no cervical LN enlargement, thyroid does not appear enlarged. CARDIAC- RRR, no murmurs, rubs or gallops. RESP- Moving equal volumes of air, and clear to auscultation bilaterally. ABDOMEN- Soft,non tender, no palpable masses or organomegaly, bowel sounds present. BACK- Normal curvature of the spine, No tenderness along the vertebrae, no CVA tenderness. NEURO- No obvious Cr N abnormality EXTREMITIES- pulse 2+, symmetric. SKIN- Warm, dry, No rash or lesion. PSYCH- Normal mood and affect, appropriate thought content and speech.  Assessment & Plan:  The patient's case and plan of care was discussed with attending physician, Dr. Sharlene Motts.  Please see problem based chatting for assessment and plan.

## 2013-03-15 NOTE — Patient Instructions (Signed)
We will be sending your prescription to your pharmacy. Stopping smoking will help reduce the number of asthma attacks you have. Will also be doing some blood work for you today to ensure you are okay.  Please continue taking all your medications as prescribed.

## 2013-03-15 NOTE — Assessment & Plan Note (Signed)
Currently on Rosuvastatin- 40mg  dly.  Last Lipid profile- 12/25/2011- LDL- 58, Total- 149, TG- 247.  Plan- Lipid profile check today, If TG is still elevated- will call pt and advice starting Fish oil.

## 2013-03-15 NOTE — Assessment & Plan Note (Signed)
Pt is due for a Pap Smear and flu shot.   Plan- Pap smear today. While sample was collected, cervical discharge was present around the Os, which appeared darkinsh cream colour, pt said she might have been having some itching, but no increase in discharge recently. Also Last sexual atv- 6-7 years ago, no abdominal pain. Will get a wet prep just in case, pt agreeable.

## 2013-03-15 NOTE — Assessment & Plan Note (Signed)
Uses her inhaler- Albuterol everyday, but has about 2 asthma attacks in a week. Night time awakenings- 1ce in 3-4 weeks. Uses Albuterol inhaler. Demonstrated use to me- which was appropriate. Stopped refilling the Spiriva inhaler as she said it doesnt help with her asthma attacks. More likely pts attacks are more of asthma than COPD, and therefore poor response to Anticholinergics. Appears pt has intermittent symptoms, will consider inhaled corticosteroids if attacks get worse.  Plan- Cont current treatment.

## 2013-03-17 NOTE — Progress Notes (Signed)
I saw and evaluated the patient.  I personally confirmed the key portions of the history and exam documented by Dr. Emokpae and I reviewed pertinent patient test results.  The assessment, diagnosis, and plan were formulated together and I agree with the documentation in the resident's note. 

## 2013-08-02 ENCOUNTER — Encounter: Payer: Self-pay | Admitting: Internal Medicine

## 2013-09-01 ENCOUNTER — Other Ambulatory Visit: Payer: Self-pay | Admitting: Internal Medicine

## 2013-11-14 ENCOUNTER — Encounter: Payer: Self-pay | Admitting: *Deleted

## 2013-11-22 ENCOUNTER — Encounter: Payer: Self-pay | Admitting: Internal Medicine

## 2013-11-22 ENCOUNTER — Ambulatory Visit (HOSPITAL_COMMUNITY)
Admission: RE | Admit: 2013-11-22 | Discharge: 2013-11-22 | Disposition: A | Discharge status: Home / Self Care | Payer: Medicare Other | Source: Ambulatory Visit | Attending: Internal Medicine | Admitting: Internal Medicine

## 2013-11-22 ENCOUNTER — Ambulatory Visit (INDEPENDENT_AMBULATORY_CARE_PROVIDER_SITE_OTHER): Payer: Medicare Other | Admitting: Internal Medicine

## 2013-11-22 VITALS — BP 126/77 | HR 102 | Temp 97.3°F | Ht 63.0 in | Wt 214.6 lb

## 2013-11-22 DIAGNOSIS — N950 Postmenopausal bleeding: Secondary | ICD-10-CM

## 2013-11-22 DIAGNOSIS — R079 Chest pain, unspecified: Secondary | ICD-10-CM

## 2013-11-22 DIAGNOSIS — I10 Essential (primary) hypertension: Secondary | ICD-10-CM

## 2013-11-22 DIAGNOSIS — R072 Precordial pain: Secondary | ICD-10-CM | POA: Insufficient documentation

## 2013-11-22 DIAGNOSIS — J45909 Unspecified asthma, uncomplicated: Secondary | ICD-10-CM

## 2013-11-22 DIAGNOSIS — E039 Hypothyroidism, unspecified: Secondary | ICD-10-CM

## 2013-11-22 LAB — CBC
HCT: 42.5 % (ref 36.0–46.0)
Hemoglobin: 14.7 g/dL (ref 12.0–15.0)
MCH: 31.2 pg (ref 26.0–34.0)
MCHC: 34.6 g/dL (ref 30.0–36.0)
MCV: 90.2 fL (ref 78.0–100.0)
PLATELETS: 268 10*3/uL (ref 150–400)
RBC: 4.71 MIL/uL (ref 3.87–5.11)
RDW: 13.4 % (ref 11.5–15.5)
WBC: 8.5 10*3/uL (ref 4.0–10.5)

## 2013-11-22 LAB — TROPONIN I: Troponin I: <0.3 ng/mL (ref <0.06)

## 2013-11-22 LAB — TSH: TSH: 17.554 u[IU]/mL — ABNORMAL HIGH (ref 0.350–4.500)

## 2013-11-22 MED ORDER — LISINOPRIL-HYDROCHLOROTHIAZIDE 10-12.5 MG PO TABS: 1.0000 | ORAL_TABLET | Freq: Every day | ORAL | Status: DC

## 2013-11-22 MED ORDER — FLUTICASONE PROPIONATE HFA 44 MCG/ACT IN AERO: 2.0000 | INHALATION_SPRAY | Freq: Two times a day (BID) | RESPIRATORY_TRACT | Status: DC

## 2013-11-22 MED ORDER — METOPROLOL TARTRATE 25 MG PO TABS: ORAL_TABLET | ORAL | Status: DC

## 2013-11-22 NOTE — Progress Notes (Signed)
Patient ID: Grace [name], female   DOB: 1955-11-15, 58 y.o.   MRN: [id number]   Subjective:   Patient ID: Grace [name] female   DOB: [birth year] 58 y.o.   MRN: [id number]  HPI: [name] is a 58 y.o. with PMH of HTN, COPD/Asthma, Hypothyroidism, HLD, tobacco abuse, presented today complaints of chest pain- intermittent, started about February this year, described as sharp,  present at rest and with exertion, last episode was yesterday. With exertion chest pain is provoked, and relieved by rest, but also present with rest. Last about 3-5 mins, rarely up to 10 mins. Father had 4 heart attacks, first one when he was in his fifties.  No associated SOB, diaphoresis, nausea or vomiting with chest pain. Takes a daily aspirin.  Though pt denied previous episodes of chest pain, pt say shse has had a stress test done. Chart review shows pt has indeed had a stress test done- 2005- Exercise tread mill with nuclear imaging and results- No EKG changes with exercise but pt had freq PVCs which disappeared with increased heart rate with exercise.   Pt also reports having reflux worse at night, with occasional cough, pt occasional takes prilosec, but intermittently.  Past Medical History  Diagnosis Date  . GERD (gastroesophageal reflux disease)   . Hyperlipidemia   . Hypertension   . Migraine   . COPD (chronic obstructive pulmonary disease)   . Hypothyroidism     with hx of multinodular goiter (noted on neck US in 11/2002 - showing diffuse nodularity and inhomogenous texture diffusely BL)  . Asthma   . Hiatal hernia   . Barrett's esophagus     with high grade dysplasia per endoscopy 05/2005 // followed by Dr. Silvano [name] ([facility])  . History of CVA (cerebrovascular accident) 10/2000  . Endometrial polyp 03/2003    s/p resection in 03/2003. Path showing submucosal leiomyoma and benign  proliferative type endometrium  . Duodenal ulcer disease     thought to be contributed at least partly by overuse  of NSAIDs and salicylates (goody powdr)  . Internal hemorrhoids     grade 2 per colonoscopy in 09/2006 - repeat colonoscopy rec in 5-10 years.  . 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 Fibroid uterus     s/p myomectomy x 2  . Tachycardia   . Nevus   . H/O failed moderate sedation   . Personal history of failed conscious sedation 08/11/2011   Current Outpatient Prescriptions  Medication Sig Dispense Refill  . albuterol (PROVENTIL HFA;VENTOLIN HFA) 108 (90 BASE) MCG/ACT inhaler Inhale 2 puffs into the lungs every 6 (six) hours as needed for wheezing.  6 Inhaler  1  . aspirin EC 81 MG tablet Take 81 mg by mouth daily.      . cyclobenzaprine (FLEXERIL) 10 MG tablet Take 1 tablet (10 mg total) by mouth 3 (three) times daily as needed for muscle spasms (Causes drowsiness, do not take with alcohol, do not drive or operate heavy machinery).  90 tablet  1  . esomeprazole (NEXIUM) 40 MG capsule Take 1 capsule (40 mg total) by mouth daily.  30 capsule  1  . levothyroxine (SYNTHROID, LEVOTHROID) 125 MCG tablet Take 1 tablet (125 mcg total) by mouth daily.  30 tablet  11  . lisinopril-hydrochlorothiazide (PRINZIDE,ZESTORETIC) 10-12.5 MG per tablet Take 1 tablet by mouth daily.  90 tablet  3  . metoprolol tartrate (LOPRESSOR) 25 MG tablet TAKE ONE TABLET BY MOUTH TWICE DAILY   60 tablet  3  . rosuvastatin (CRESTOR) 40 MG tablet Take 1 tablet (40 mg total) by mouth daily.  30 tablet  5  . tiotropium (SPIRIVA HANDIHALER) 18 MCG inhalation capsule Place 1 capsule (18 mcg total) into inhaler and inhale daily.  30 capsule  2  . [DISCONTINUED] ipratropium (ATROVENT HFA) 17 MCG/ACT inhaler Inhale 2 puffs into the lungs 4 (four) times daily.  1 Inhaler  6   No current facility-administered medications for this visit.   Family History  Problem Relation Age of Onset  . COPD Mother   . Heart disease Father     had 5  heart attacks, first at age 31s.  Marland Kitchen COPD Father    History   Social History  . Marital Status: Divorced    Spouse Name: N/A    Number of Children: N/A  . Years of Education: N/A   Social History Main Topics  . Smoking status: Current Every Day Smoker -- 0.50 packs/day for 42 years    Types: Cigarettes    Last Attempt to Quit: 05/09/2012  . Smokeless tobacco: Never Used     Comment: Counseling sheet [id number]   . Alcohol Use: No  . Drug Use: No  . Sexual Activity: None   Other Topics Concern  . None   Social History Narrative         Financial assistance approved for 100% discount at [facility] and has [facility] card per [employer]   01/15/2010            Review of Systems: CONSTITUTIONAL- No Fever, weightloss, has occasional night sweat related to menopause no change in appetite. SKIN- No Rash, colour changes or itching. HEAD- No Headache or dizziness. RESPIRATORY- Has occasional cough, SOB relieved with albuterol inhaler CARDIAC- No Palpitations, DOE, PND or chest pain. GI- No nausea, vomiting, diarrhoea, constipation, abd pain. URINARY- No Frequency, urgency, straining or dysuria. NEUROLOGIC- Complaints of persistent Numbness- Right side,no syncope, seizures or burning. [facility]- Denies depression or anxiety.  Objective:  Physical Exam: Filed Vitals:   11/22/13 1433  BP: 126/77  Pulse: 102  Temp: 97.3 F (36.3 C)  TempSrc: Oral  Height: 5\' 3"  (1.6 m)  Weight: 214 lb 9.6 oz (97.342 kg)  SpO2: 95%   GENERAL- alert, co-operative, appears as stated age, not in any distress. HEENT- Atraumatic, normocephalic, PERRL, EOMI, oral mucosa appears moist, neck supple. CARDIAC- RRR, no murmurs, rubs or gallops, chest pain not reproducible.Marland Kitchen RESP- Moving equal volumes of air, and clear to auscultation bilaterally, no wheezes or crackles. ABDOMEN- Soft, obese, nontender, no guarding or rebound, no palpable masses or organomegaly, bowel sounds present. NEURO- No obvious Cr N  abnormality, strenght upper and lower extremities- 5/5, Gait- Normal. EXTREMITIES- pulse 2+, symmetric, no pedal edema. SKIN- Warm, dry, No rash or lesion. PSYCH- Normal mood and affect, appropriate thought content and speech.  Assessment & Plan:   The patient's case and plan of care was discussed with attending physician, Dr. Delfino Lovett.  Please see problem based charting for assessment and plan.

## 2013-11-22 NOTE — Assessment & Plan Note (Addendum)
Appears well controlled. Complaint with meds.  Plan- TSH today.  Addendum- 11/23/2013- TSH markedly elevated at 17.55. Pt once again says she has been very complaint with her medication, taking it in the morning on an empty stomach, 1 hr before meals. Has been on 168mcg daily. Pt endorses feeling tired all the time. Will increase dose of synthroid to 180mcg daily. Reaccess with TSH in 6 weeks. Prescription called in.

## 2013-11-22 NOTE — Assessment & Plan Note (Addendum)
Pt says she uses her inhaler- 3-4 times a day, everyday. Few night time awakenings, cannot really specify.  Inhaler relieves SOB. Pt has occasional cough- non productive, still smokes cigarettes,- now down to 5-10 cigs per day. Been smoking since she was 58 years old. Pt denies fever, change in cough. Pt asthm qualifies for moderate persisten asthma.  Plan- Start inhaled steroids- Fluticasone. Advised to rinse out her mouth with water to prevent thrush. - Reacess in a month.

## 2013-11-22 NOTE — Assessment & Plan Note (Addendum)
Started in 2009, after one year of absent periods- likely menpausal. Bleeding is intermittent and unpredictable. Most blood is dark red, but Last episode- ~2 days ago, with bright red bleeding. Pt says her mother was exposed to DES. Last pap smear- 2014- was normal. No dizziness, sometimes with clots, no cramps, vaginal discharge or itching.  Plan- Refferal to Gynae. - CBC today.

## 2013-11-22 NOTE — Assessment & Plan Note (Addendum)
Chest pain- TIMI score- 2. Pt low risk, but with risk factors- HTN, Obesity, Smoking, HLD, reported hx of CVA- but no imaging on file confirms this as this was in 2001, and earliest imaging per chart review- MRI- 2002. Considerations for Chest pain - Unstable angina, but also GERD. Pt says she doesn't want to do an exercise stress test like she did in the past, she didn't like it and she cannot run.   PLan- EKG- Rate- 85bpm, no concerning St or T wave abnormalities.  - Stat troponin- pt agreed to wait for results but left without informing us she was leaving. Troponin- Result negative- < 0.03. Called pt with result, and told her if she is having severe chest pains she should go to the ED. Also on the need to have a stress test done as soon as possible- Pt voiced understanding and agreed to plan. - Encouraged pt to continue PPI, scheduled and not as needed.  - Refferal to cardiology and Stress testing- Lexiscan myoview. ( Pt cannot run and has COPD with obesity).

## 2013-11-22 NOTE — Progress Notes (Signed)
Patient ID: Grace [name], female   DOB: 1955-07-08, 58 y.o.   MRN: [id number]  Case discussed with Dr. Denton Brick at the time of the visit.  We reviewed the resident's history and exam and pertinent patient test results.  I agree with the assessment, diagnosis, and plan of care documented in the resident's note.

## 2013-11-23 MED ORDER — LEVOTHYROXINE SODIUM 150 MCG PO TABS: 150.0000 ug | ORAL_TABLET | Freq: Every day | ORAL | Status: DC

## 2013-11-23 NOTE — Addendum Note (Signed)
Addended by: Bethena Roys on: 11/23/2013 01:33 PM   Modules accepted: Orders, Medications

## 2013-11-26 NOTE — Addendum Note (Signed)
Addended by: Bethena Roys on: 11/26/2013 08:48 PM   Modules accepted: Orders

## 2013-11-29 ENCOUNTER — Telehealth: Payer: Self-pay | Admitting: *Deleted

## 2013-11-29 NOTE — Telephone Encounter (Signed)
Spoke with mother/ information given her [facility] appointment. September . /  See referral for complete referral information. Angalina Ante NTII  [phone].

## 2013-11-30 ENCOUNTER — Encounter: Payer: Self-pay | Admitting: Internal Medicine

## 2013-12-01 ENCOUNTER — Other Ambulatory Visit: Payer: Self-pay | Admitting: *Deleted

## 2013-12-01 ENCOUNTER — Telehealth: Payer: Self-pay | Admitting: Internal Medicine

## 2013-12-01 ENCOUNTER — Encounter: Payer: Self-pay | Admitting: Internal Medicine

## 2013-12-01 NOTE — Telephone Encounter (Signed)
Grace [name] was schedule to have Myoview on 12-05-12.  She call today to cancel test because she want to follow up your office before she has the test.   I will defer the order until our office here from her.

## 2013-12-02 ENCOUNTER — Other Ambulatory Visit: Payer: Self-pay | Admitting: Internal Medicine

## 2013-12-02 MED ORDER — ALBUTEROL SULFATE HFA 108 (90 BASE) MCG/ACT IN AERS: 2.0000 | INHALATION_SPRAY | Freq: Four times a day (QID) | RESPIRATORY_TRACT | Status: DC | PRN

## 2013-12-05 ENCOUNTER — Encounter (HOSPITAL_COMMUNITY): Payer: Medicare Other

## 2013-12-13 ENCOUNTER — Encounter: Payer: Self-pay | Admitting: Obstetrics & Gynecology

## 2014-01-03 ENCOUNTER — Encounter: Payer: Medicare Other | Admitting: Internal Medicine

## 2014-01-04 ENCOUNTER — Institutional Professional Consult (permissible substitution): Payer: Medicare Other | Admitting: Interventional Cardiology

## 2014-01-04 NOTE — Addendum Note (Signed)
Addended by: Hulan Fray on: 01/04/2014 05:53 PM   Modules accepted: Orders

## 2014-01-19 ENCOUNTER — Encounter: Payer: Medicare Other | Admitting: Obstetrics & Gynecology

## 2014-02-19 ENCOUNTER — Other Ambulatory Visit: Payer: Self-pay | Admitting: Internal Medicine

## 2014-02-24 ENCOUNTER — Encounter: Payer: Self-pay | Admitting: Obstetrics & Gynecology

## 2014-02-24 ENCOUNTER — Ambulatory Visit (INDEPENDENT_AMBULATORY_CARE_PROVIDER_SITE_OTHER): Payer: Medicare Other | Admitting: Obstetrics & Gynecology

## 2014-02-24 ENCOUNTER — Other Ambulatory Visit (HOSPITAL_COMMUNITY)
Admission: RE | Admit: 2014-02-24 | Discharge: 2014-02-24 | Disposition: A | Discharge status: Home / Self Care | Payer: Medicare Other | Source: Ambulatory Visit | Attending: Obstetrics & Gynecology | Admitting: Obstetrics & Gynecology

## 2014-02-24 VITALS — BP 118/73 | HR 85 | Temp 98.2°F | Wt 210.8 lb

## 2014-02-24 DIAGNOSIS — N841 Polyp of cervix uteri: Secondary | ICD-10-CM

## 2014-02-24 DIAGNOSIS — N950 Postmenopausal bleeding: Secondary | ICD-10-CM | POA: Insufficient documentation

## 2014-02-24 MED ORDER — MEGESTROL ACETATE 40 MG PO TABS: 40.0000 mg | ORAL_TABLET | Freq: Three times a day (TID) | ORAL | Status: DC | PRN

## 2014-02-24 NOTE — Patient Instructions (Signed)
Postmenopausal Bleeding  Postmenopausal bleeding is any bleeding a woman has after she has entered into menopause. Menopause is the end of a woman's fertile years. After menopause, a woman no longer ovulates or has menstrual periods.   Postmenopausal bleeding can be caused by various things. Any type of postmenopausal bleeding, even if it appears to be a typical menstrual period, is concerning. This should be evaluated by your health care provider. Any treatment will depend on the cause of the bleeding.  HOME CARE INSTRUCTIONS  Monitor your condition for any changes. The following actions may help to alleviate any discomfort you are experiencing:  · Avoid the use of tampons and douches as directed by your health care provider.   · Change your pads frequently.  · Get regular pelvic exams and Pap tests.  · Keep all follow-up appointments for diagnostic tests as directed by your health care provider.  SEEK MEDICAL CARE IF:   · Your bleeding lasts more than 1 week.  · You have abdominal pain.  · You have bleeding with sexual intercourse.  SEEK IMMEDIATE MEDICAL CARE IF:   · You have a fever, chills, headache, dizziness, muscle aches, and bleeding.  · You have severe pain with bleeding.  · You are passing blood clots.  · You have bleeding and need more than 1 pad an hour.  · You feel faint.  MAKE SURE YOU:  · Understand these instructions.  · Will watch your condition.  · Will get help right away if you are not doing well or get worse.  Document Released: 07/09/2005 Document Revised: 01/19/2013 Document Reviewed: 10/28/2012  ExitCare® Patient Information ©2015 [employer]. This information is not intended to replace advice given to you by your health care provider. Make sure you discuss any questions you have with your health care provider.

## 2014-02-24 NOTE — Progress Notes (Signed)
Patient here today for postmenopausal bleeding. Reports started having minimal periods in 2007, where she would have a period and then not have any bleeding for 8 months, would have it return for 2 days and then again no bleeding for another 8 months. Reports longest break was about 15 months with no periods. States bleeding became frustrating about a year ago where it became more frequent and heavy.  Most recently she bled from September 11- September 28, very heavy bright red blood, spotted in October.

## 2014-02-24 NOTE — Progress Notes (Signed)
Subjective:     Patient ID: Grace [name], female   DOB: 10/18/1955, 58 y.o.   MRN: [id number]  HPI  POSTMENOPAUSAL BLEEDING: - Reported light periods starting 2007, would go up to 12-15 months w/o period, then have few days of light dark bleeding. Significant episode of recent bleeding from 12/23/13 to 01/09/14 with regular constant bleeding, first 4-5 days with heavy red blood, associated with some cramping, then lightened up and had darker spotted. Also reports some light spotting for 3-4 days around 01/22/14. Admits to some facial hair growth Denies any significant other menopausal symptoms, without hot flashes  - Last Pap Smear 03/2013, negative. (Reported hx cervical dysplasia >20 years ago s/p excision and cryotherapy, no abnormal paps since) - Last Pelvic US (transvaginal) - 2008, fibroid uterus, b/l fibroids, endometrial polyp (s/p surgical removal of fibroids)  Family Hx: No known endometrial, ovarian, breast, or cervical CA  I have reviewed and updated the following as appropriate: allergies and current medications  Social Hx: - Active smoker, < 0.5ppd, >30 years  Review of Systems  See above HPI    Objective:   Physical Exam  BP 118/73 mmHg  Pulse 85  Temp(Src) 98.2 F (36.8 C) (Oral)  Wt 210 lb 12.8 oz (95.618 kg)  Gen - well-appearing, NAD Abd - soft, NTND, no masses Ext - non-tender, no edema, peripheral pulses intact +2 b/l Skin - warm, dry Pelvic Exam - Normal external female genitalia. Vaginal canal without lesions. Normal appearing cervix, with 3 mm polypoid lesion noted in the ectocervix with narrow stalk. No evidence of blood in vaginal vault or at cervical os. Physiologic discharge on exam.  ENDOMETRIAL BIOPSY AND POLYPECTOMY  The indications for endometrial biopsy were reviewed. Risks of the biopsy including cramping, bleeding, infection, uterine perforation, inadequate specimen and need for additional procedures  were discussed. The patient states  she understands and agrees to undergo procedure today. Consent was signed. Time out was performed. Urine HCG was negative. During the pelvic exam, the cervix was prepped with Betadine. Identified cervical polyp, consented for removal. Ring forceps were used to grasp the polypoid lesion and the polypoid lesionswas removed by twisting it off its base.  Tissue sent to pathology for analysis. The 3 mm pipelle was introduced into the endometrial cavity without difficulty to a depth of 9 cm, and a small amount of tissue was obtained after two passes and sent to pathology. The instruments were removed from the patient's vagina. Minimal bleeding from the cervix was noted. The patient tolerated the procedure well. Routine post-procedure instructions were given to the patient.     Assessment:     47 yr F presents with postmenopausal bleeding for past few years with recurrent episodes, significant vaginal bleeding 12/2013. PMH uterine fibroids s/p myomectomy. Last pap negative 03/2013. Active smoker. No hx known CA or family hx endometrial, cervical CA.      Plan:     Performed Endometrial biopsy and cervical polypectomy today, pathology sent, will follow up results and manage accordingly. Ordered Pelvic US, transvaginal, eval endometrium in setting of postmenopausal bleeding Megace 40mg  TID PRN bleeding RTC follow-up results biopsy and [place]  Rashea Hoskie, [place], PGY-2  Attestation of Attending Supervision of Resident: Evaluation and management procedures were performed by the [facility] Medicine Resident under my supervision.  I have seen and examined the patient, reviewed the resident's note and chart, supervised the procedures, and I agree with the management and plan.  UGONNA  Harolyn Rutherford, MD, [place] Attending [place], [facility]

## 2014-02-27 ENCOUNTER — Other Ambulatory Visit: Payer: Self-pay | Admitting: Internal Medicine

## 2014-02-27 ENCOUNTER — Telehealth: Payer: Self-pay | Admitting: *Deleted

## 2014-02-27 NOTE — Telephone Encounter (Addendum)
-----   Message from Osborne Oman, MD sent at 02/27/2014  4:38 PM EST ----- Benign endometrial biopsy and polyp. Please call to inform patient of results.  Called pt and left message that I am calling with results information and will try to reach her tomorrow. If detailed information can be left on her voice mail, please call back and provide that information.  Diane Day [facility] 11/18  0940  Called pt and left another message that I am calling with test results - non urgent. Please call back.  Diane Day [facility] 11/19  1415  Called pt's home # and left message to call back for results information.  Pt also needs to be informed that she will need follow up appt to discuss results of [place] and options for treatment/plan of care.  This appt has been scheduled on 11/25 @ 3pm.  Letter is also being sent to pt regarding the appt.  Diane Day RNC

## 2014-02-27 NOTE — Progress Notes (Signed)
Quick Note:  Benign endometrial biopsy and polyp. Please call to inform patient of results. ______

## 2014-03-02 ENCOUNTER — Ambulatory Visit (HOSPITAL_COMMUNITY)
Admission: RE | Admit: 2014-03-02 | Discharge: 2014-03-02 | Disposition: A | Discharge status: Home / Self Care | Payer: Medicare Other | Source: Ambulatory Visit | Attending: Obstetrics & Gynecology | Admitting: Obstetrics & Gynecology

## 2014-03-02 ENCOUNTER — Encounter: Payer: Self-pay | Admitting: Obstetrics & Gynecology

## 2014-03-02 DIAGNOSIS — N950 Postmenopausal bleeding: Secondary | ICD-10-CM | POA: Diagnosis present

## 2014-03-02 DIAGNOSIS — D251 Intramural leiomyoma of uterus: Secondary | ICD-10-CM | POA: Diagnosis present

## 2014-03-02 DIAGNOSIS — D252 Subserosal leiomyoma of uterus: Secondary | ICD-10-CM | POA: Insufficient documentation

## 2014-03-02 DIAGNOSIS — M545 Low back pain: Secondary | ICD-10-CM | POA: Diagnosis present

## 2014-03-02 NOTE — Telephone Encounter (Signed)
Patient called and left message stating she is returning our call and we can leave information on her voicemail

## 2014-03-03 NOTE — Telephone Encounter (Addendum)
Called Grace [name] and left a voicemail as requested notifying her we are calling to notify her of results- negative endometrial biopsy, ultrasound showing multiple fibroids and thickended endometrium. Explained doctor will review and explain these results at her results appointment -left appointment date and time and to  please call if questions.

## 2014-03-06 ENCOUNTER — Ambulatory Visit (INDEPENDENT_AMBULATORY_CARE_PROVIDER_SITE_OTHER): Payer: Medicare Other | Admitting: Obstetrics and Gynecology

## 2014-03-06 ENCOUNTER — Encounter: Payer: Self-pay | Admitting: Obstetrics and Gynecology

## 2014-03-06 VITALS — BP 97/45 | HR 85 | Ht 63.0 in | Wt 212.0 lb

## 2014-03-06 DIAGNOSIS — Z712 Person consulting for explanation of examination or test findings: Secondary | ICD-10-CM

## 2014-03-06 DIAGNOSIS — Z7189 Other specified counseling: Secondary | ICD-10-CM

## 2014-03-06 DIAGNOSIS — N841 Polyp of cervix uteri: Secondary | ICD-10-CM

## 2014-03-06 NOTE — Progress Notes (Signed)
Patient ID: Grace [name], female   DOB: Jun 11, 1955, 58 y.o.   MRN: [id number] 58 yo presenting today to discuss results of biopsy and ultrasound. Results were reviewed and explained to the patient. She reports persistent vaginal bleeding since the biopsy which has improved with the use of Megace which she started to take yesterday. Patient reports a pattern of vaginal bleeding every year or year and a half since her early 50's.   Endometrial biopsy Diagnosis 1. Endometrium, biopsy - INACTIVE ENDOMETRIUM, SCANT BENIGN ENDOCERVIX AND SCANT SQUAMOUS FRAGMENTS. NO HYPERPLASIA OR CARCINOMA. 2. Cervix, polyp - BENIGN LOWER SEGMENT ENDOMETRIAL POLYP.   03/02/2014 ultrasound FINDINGS: Uterus  Measurements: 11.0 x 6.3 x 8.6 cm. Multiple uterine fibroids. Dominant fibroids include:  --5.3 x 4.2 x 3.4 cm transmural fibroid in the right anterior uterine fundus  --3.7 x 2.9 x 2.7 cm intramural fibroid in the left anterior uterine body  --2.0 x 1.4 x 1.6 cm subserosal fibroid in the left posterior lower uterine segment  Endometrium  Thickness: 16 mm. Heterogeneous/thickened with mild vascularity. Hypoechoic area within the endometrium without vascularity likely reflects prior endometrial ablation.  Right ovary  Measurements: 2.0 x 1.3 x 1.4 cm. Normal appearance/no adnexal mass.  Left ovary  Measurements: 3.1 x 2.6 x 2.9 cm. Normal appearance/no adnexal mass.  Other findings  No free fluid.  IMPRESSION: Multiple uterine fibroids, measuring up to 5.3 cm, as described above.  Endometrial complex measures 13 mm, considered abnormal in the setting of postmenopausal bleeding. Prior endometrial ablation.  In the setting of post-menopausal bleeding, endometrial sampling is indicated to exclude carcinoma. If results are benign, sonohysterogram should be considered for focal lesion work-up.  (Ref: Radiological Reasoning: Algorithmic Workup of Abnormal  Vaginal Bleeding with Endovaginal Sonography and Sonohysterography. AJR 2008; 614:E31-54)   Electronically Signed  By: Julian Hy M.D.  On: 03/02/2014 13:04  A/P 58 yo with abnormal uterine bleeding - Discussed surgical management with D&C hysteroscopy given thickened endometrial complex in this postmenopausal patient - Risks, benefits and alternatives were explained. All questions were answered. Patient will be scheduled - Continue Megace for now

## 2014-03-31 ENCOUNTER — Encounter: Payer: Self-pay | Admitting: *Deleted

## 2014-04-04 ENCOUNTER — Encounter (HOSPITAL_COMMUNITY): Admission: RE | Payer: Self-pay | Source: Ambulatory Visit

## 2014-04-04 ENCOUNTER — Ambulatory Visit (HOSPITAL_COMMUNITY)
Admission: RE | Admit: 2014-04-04 | Payer: Medicare Other | Source: Ambulatory Visit | Admitting: Obstetrics and Gynecology

## 2014-04-04 SURGERY — DILATATION AND CURETTAGE /HYSTEROSCOPY
Anesthesia: Choice | Site: Vagina

## 2014-04-26 ENCOUNTER — Other Ambulatory Visit: Payer: Self-pay | Admitting: Internal Medicine

## 2014-05-04 ENCOUNTER — Encounter: Payer: Medicare Other | Admitting: Internal Medicine

## 2014-05-16 ENCOUNTER — Encounter: Payer: Self-pay | Admitting: Internal Medicine

## 2014-05-26 ENCOUNTER — Ambulatory Visit: Payer: Self-pay | Admitting: Internal Medicine

## 2014-06-05 ENCOUNTER — Other Ambulatory Visit: Payer: Self-pay | Admitting: Internal Medicine

## 2014-06-13 HISTORY — PX: TRANSTHORACIC ECHOCARDIOGRAM: SHX275

## 2014-06-20 ENCOUNTER — Encounter: Payer: Self-pay | Admitting: Internal Medicine

## 2014-06-30 ENCOUNTER — Ambulatory Visit (INDEPENDENT_AMBULATORY_CARE_PROVIDER_SITE_OTHER): Payer: Medicare Other | Admitting: Internal Medicine

## 2014-06-30 ENCOUNTER — Encounter: Payer: Self-pay | Admitting: Internal Medicine

## 2014-06-30 VITALS — BP 124/80 | HR 101 | Temp 98.8°F | Ht 63.5 in | Wt 204.0 lb

## 2014-06-30 DIAGNOSIS — G8929 Other chronic pain: Secondary | ICD-10-CM

## 2014-06-30 DIAGNOSIS — E0789 Other specified disorders of thyroid: Secondary | ICD-10-CM | POA: Diagnosis not present

## 2014-06-30 DIAGNOSIS — R7309 Other abnormal glucose: Secondary | ICD-10-CM | POA: Diagnosis not present

## 2014-06-30 DIAGNOSIS — N950 Postmenopausal bleeding: Secondary | ICD-10-CM

## 2014-06-30 DIAGNOSIS — K267 Chronic duodenal ulcer without hemorrhage or perforation: Secondary | ICD-10-CM

## 2014-06-30 DIAGNOSIS — R0602 Shortness of breath: Secondary | ICD-10-CM

## 2014-06-30 DIAGNOSIS — G43009 Migraine without aura, not intractable, without status migrainosus: Secondary | ICD-10-CM

## 2014-06-30 DIAGNOSIS — Z720 Tobacco use: Secondary | ICD-10-CM | POA: Diagnosis not present

## 2014-06-30 DIAGNOSIS — M545 Low back pain, unspecified: Secondary | ICD-10-CM

## 2014-06-30 DIAGNOSIS — K22719 Barrett's esophagus with dysplasia, unspecified: Secondary | ICD-10-CM | POA: Diagnosis not present

## 2014-06-30 DIAGNOSIS — R739 Hyperglycemia, unspecified: Secondary | ICD-10-CM | POA: Diagnosis not present

## 2014-06-30 DIAGNOSIS — R079 Chest pain, unspecified: Secondary | ICD-10-CM

## 2014-06-30 DIAGNOSIS — K311 Adult hypertrophic pyloric stenosis: Secondary | ICD-10-CM

## 2014-06-30 DIAGNOSIS — E038 Other specified hypothyroidism: Secondary | ICD-10-CM | POA: Diagnosis not present

## 2014-06-30 DIAGNOSIS — E034 Atrophy of thyroid (acquired): Secondary | ICD-10-CM

## 2014-06-30 DIAGNOSIS — E785 Hyperlipidemia, unspecified: Secondary | ICD-10-CM

## 2014-06-30 NOTE — Progress Notes (Signed)
Patient ID: Grace [name], female   DOB: 07-11-1955, 59 y.o.   MRN: [id number]    Facility  [facility]    Place of Service:   OFFICE   Allergies  Allergen Reactions  . Codeine     REACTION: headaches    Chief Complaint  Patient presents with  . Establish Care    New patient establish care, not fasting (has sugar-free grape drink)   . Chest Pain    Patient c/o chest pain (ongoing concern), patient was told she needs stress test (patient did not follow through with recommendation). When pain onset patient gets dizzy and it last for seconds (1 episode a couple of min)  . Back Pain    Continual back pain, limits mobility. Patient tried to get into a study about back, patient was disqualified due to elevated blood sugar  . Vaginal Bleeding    Patient c/o vaginal bleeding since November, patient had biospy.     HPI:  59 yo female seen today as a new pt. She has a few concerns.  Chest pain - occurs intermittent and occasional sharp in substernal area. Last week, the pain awakened her from sleep and was achy in nature. Pain associated with dizziness. No palpitations. Chronic SOB. No N/V or diaphoresis. FHx CAD (father dies with MI at 54 with first MI in late 67s). Last stress test was several yrs ago on treadmill. She is "scared" of taking nuclear study. She has had ECGs in the past. Hx COPD and mostly stable on flovent and proair. She is trying to get into a COPD study (just completed one May 12, 2014). She still smokes but has reduced amt to 4-5 cigs/day and states she is trying to quit.  Back pain - reduced QOL due to pain. Unable to perform household chores without stopping to rest and ADLs take extra long time to complete. She has a shower chair. She has a cane at home but does not use it often. Taking 800mg  ibuprofen for pain and alternates with goody powder. She has flexeril which she uses sparingly for muscle spasms. Current pain is 7/10 but can increase to 10/10. She has not had any imaging  studies recently and last imaging done with a back study. She was disqualified due to "elevated BS of 8". She has numbness in RUE since stroke in 2001. No recent falls.  Vaginal bleeding - polyp removed by GYN in Nov 2015 and has been bleeding ever since. She declined to have hysterectomy and D&C and states she would rather "live with it".  Hx uterine fibroids per US done 02/2014  Chronic hx HA since childhood.  Past Medical History  Diagnosis Date  . GERD (gastroesophageal reflux disease)   . Hyperlipidemia   . Hypertension   . Migraine   . COPD (chronic obstructive pulmonary disease)   . Hypothyroidism     with hx of multinodular goiter (noted on neck US in 11/2002 - showing diffuse nodularity and inhomogenous texture diffusely BL)  . Asthma   . Hiatal hernia   . Barrett's esophagus     with high grade dysplasia per endoscopy 05/2005 // followed by Dr. [name] ([facility])  . History of CVA (cerebrovascular accident) 10/2000  . Endometrial polyp 03/2003    s/p resection in 03/2003. Path showing submucosal leiomyoma and benign  proliferative type endometrium  . Duodenal ulcer disease     thought to be contributed at least partly by overuse of NSAIDs and salicylates (goody powdr)  .  Internal hemorrhoids     grade 2 per colonoscopy in 09/2006 - repeat colonoscopy rec in 5-10 years.  . 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 Fibroid uterus     s/p myomectomy x 2  . Tachycardia   . Nevus   . H/O failed moderate sedation   . Personal history of failed conscious sedation 08/11/2011  . Thyroid disease   . Chronic back pain   . Abnormal vaginal bleeding    Past Surgical History  Procedure Laterality Date  . Cholecystectomy    . Myomectomy  1990, 1997    x 2 - In 1997, noted to have extensive pelvic adhesions and BL tubal obstruction  . Colonoscopy  02/07/2010  . Upper gastrointestinal  endoscopy  12/18/2009  . Hysteroscopy     Family History  Problem Relation Age of Onset  . COPD Mother   . Heart disease Father     had 5 heart attacks, first at age 60s.  Marland Kitchen COPD Father    History   Social History  . Marital Status: Divorced    Spouse Name: N/A  . Number of Children: N/A  . Years of Education: N/A   Social History Main Topics  . Smoking status: Current Every Day Smoker -- 0.25 packs/day for 42 years    Types: Cigarettes    Last Attempt to Quit: 05/09/2012  . Smokeless tobacco: Never Used     Comment: Counseling sheet [id number]   . Alcohol Use: No  . Drug Use: No  . Sexual Activity: Yes   Other Topics Concern  . None   Social History Narrative         Financial assistance approved for 100% discount at [facility] and has [facility] card per [employer]   01/15/2010      Diet- N/A   Caffeine- Goody powders   Married-Divorced   [employer] and lives with mother   Pets-2   Current/past profession-[employer]   Exercise-No   Living will-No   DNR-No   POA/HPOA-No              Medications: Patient's Medications  New Prescriptions   No medications on file  Previous Medications   ASPIRIN-ACETAMINOPHEN-CAFFEINE 500-325-65 MG PACK    Take by mouth as needed.   BIOTIN 1000 MCG TABLET    Take 1,000 mcg by mouth 3 (three) times daily.   CYCLOBENZAPRINE (FLEXERIL) 10 MG TABLET    Take 10 mg by mouth as needed for muscle spasms.   DIPHENHYDRAMINE (BENADRYL) 25 MG TABLET    Take 25 mg by mouth as needed.   FLOVENT HFA 44 MCG/ACT INHALER    Inhale 2 puffs into the lungs two times daily   IBUPROFEN (ADVIL,MOTRIN) 200 MG TABLET    Take 200 mg by mouth as needed.   LEVOTHYROXINE (SYNTHROID, LEVOTHROID) 150 MCG TABLET    TAKE ONE TABLET BY MOUTH IN THE MORNING ONE HOURS BEFORE BREAKFAST. DO NOT TAKE WITH NEXIUM   LISINOPRIL-HYDROCHLOROTHIAZIDE (PRINZIDE,ZESTORETIC) 10-12.5 MG PER TABLET    Take 1 tablet by mouth daily.   MEGESTROL (MEGACE) 40 MG TABLET    Take 1 tablet (40 mg  total) by mouth 3 (three) times daily as needed (heavy vaginal bleeding).   MELATONIN 10 MG CAPS    Take by mouth at bedtime.   METOPROLOL TARTRATE (LOPRESSOR) 25 MG TABLET  TAKE ONE TABLET BY MOUTH TWICE DAILY    RANITIDINE (ZANTAC) 150 MG TABLET    Take 150 mg by mouth 2 (two) times daily.   ROSUVASTATIN (CRESTOR) 40 MG TABLET    Take 1 tablet (40 mg total) by mouth daily.   VALERIAN ROOT PO    Take by mouth at bedtime as needed.  Modified Medications   No medications on file  Discontinued Medications   ASPIRIN EC 81 MG TABLET    Take 81 mg by mouth daily.   PROAIR HFA 108 (90 BASE) MCG/ACT INHALER    INHALE TWO PUFFS BY MOUTH EVERY 6 HOURS AS NEEDED FOR WHEEZING     Review of Systems  Constitutional: Negative for fever, chills, diaphoresis, activity change, appetite change and fatigue.  HENT: Negative for ear pain and sore throat.        C/o dry mouth  Eyes: Positive for visual disturbance (wears glasses).       C/o dry eyes  Respiratory: Positive for shortness of breath. Negative for cough and chest tightness.   Cardiovascular: Positive for chest pain. Negative for palpitations and leg swelling.  Gastrointestinal: Positive for constipation. Negative for nausea, vomiting, abdominal pain, diarrhea and blood in stool.  Genitourinary: Positive for vaginal bleeding. Negative for dysuria.  Musculoskeletal: Positive for back pain. Negative for arthralgias.  Neurological: Positive for dizziness, numbness and headaches. Negative for tremors.       Hx TIA  Psychiatric/Behavioral: Positive for sleep disturbance (insomnia). The patient is not nervous/anxious.     Filed Vitals:   06/30/14 0831  BP: 124/80  Pulse: 101  Temp: 98.8 F (37.1 C)  TempSrc: Oral  Height: 5' 3.5" (1.613 m)  Weight: 204 lb (92.534 kg)  SpO2: 97%   Body mass index is 35.57 kg/(m^2).  Physical Exam  Constitutional: She is oriented to person, place, and time. She appears well-developed and well-nourished. No  distress.  HENT:  Mouth/Throat: Oropharynx is clear and moist. No oropharyngeal exudate.  Eyes: Pupils are equal, round, and reactive to light. No scleral icterus.  Neck: Neck supple. No tracheal deviation present. No thyromegaly present.  Cardiovascular: Normal rate, regular rhythm, normal heart sounds and intact distal pulses.  Exam reveals no gallop and no friction rub.   No murmur heard. No LE edema b/l. no calf TTP. No carotid bruit b/l  Pulmonary/Chest: Effort normal and breath sounds normal. No stridor. No respiratory distress. She has no wheezes. She has no rales. She exhibits tenderness (CP reproducible).  Abdominal: Soft. Bowel sounds are normal. She exhibits no distension and no mass. There is tenderness (epigastric TTP but no r/g/r). There is no rebound and no guarding.  Musculoskeletal: She exhibits tenderness.       Back:  Left ASIS TTP; left pelvic inflare  Lymphadenopathy:    She has no cervical adenopathy.  Neurological: She is alert and oriented to person, place, and time. She has normal reflexes.  Skin: Skin is warm and dry. No rash noted.  Psychiatric: She has a normal mood and affect. Her behavior is normal. Judgment and thought content normal.     Labs reviewed: No visits with results within 3 Month(s) from this visit. Latest known visit with results is:  Office Visit on 11/22/2013  Component Date Value Ref Range Status  . Troponin I 11/22/2013 <0.30  <0.06 ng/mL Final   Comment: No indication of myocardial injury.  Due to the release kinetics of cTnI, a negative result within the                          first hours of the onset of symptoms does not rule out myocardial                          infarction with certainty. If myocardial infarction is still suspected                          repeat the test at appropriate intervals.  . WBC 11/22/2013 8.5  4.0 - 10.5 K/uL Final  . RBC 11/22/2013 4.71  3.87 - 5.11  MIL/uL Final  . Hemoglobin 11/22/2013 14.7  12.0 - 15.0 g/dL Final  . HCT 11/22/2013 42.5  36.0 - 46.0 % Final  . MCV 11/22/2013 90.2  78.0 - 100.0 fL Final  . MCH 11/22/2013 31.2  26.0 - 34.0 pg Final  . MCHC 11/22/2013 34.6  30.0 - 36.0 g/dL Final  . RDW 11/22/2013 13.4  11.5 - 15.5 % Final  . Platelets 11/22/2013 268  150 - 400 K/uL Final  . TSH 11/22/2013 17.554* 0.350 - 4.500 uIU/mL Final     Assessment/Plan   ICD-9-CM ICD-10-CM   1. Chest pain, unspecified chest pain type - with FHx CAD 786.50 R07.9 Pulmonary function test     2D Echocardiogram without contrast  2. Chronic low back pain with no recent imaging studies 724.2 M54.5 Urinalysis with Reflex Microscopic   338.29 G89.29   3. Shortness of breath - with chronic tobacco use; check PFTs; continue flovent 786.05 R06.02 Pulmonary function test  4. Hypothyroidism due to acquired atrophy of thyroid - on levothyroxine 244.8 E03.8 CMP   246.8 E03.4 TSH     T4, Free  5. 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 Barrett's esophagus with dysplasia  530.85 K22.719   7. Hyperlipidemia LDL goal <100 - continue crestor 272.4 E78.5 CMP     Lipid Panel  8. Hyperglycemia - assess for diabetes 790.29 R73.9 CMP     Hemoglobin A1c  9. Tobacco abuse - current use 305.1 Z72.0   10. Post-menopause bleeding with hx vaginal vs endometrial polyp and hx uterine fibroids, currently taking megace to control flow 627.1 N95.0 Ambulatory referral to Gynecology     CBC with Differential  11. Migraine without aura and without status migrainosus, not intractable - stable 346.10 G43.009     --Continue current medications forn ow. Will call with lab results and any changes to medications.  --Will call with referral appts. May need GI and cardiology referrals also  --Smoking cessation discussed and highly urged  --Follow up in 1 month for CPE  [facility]  S. Perlie Gold  [facility] [address] [place], [place] [address] [phone] Office (Wednesdays and Fridays 8 AM - 5 PM) [phone] Cell (Monday-Friday 8 AM - 5 PM)

## 2014-06-30 NOTE — Patient Instructions (Signed)
Continue current medications for ow. Will call with lab results and any changes to medications.  Will call with referral appts.  Smoking cessation discussed and highly urged  Follow up in 1 month for CPE

## 2014-07-01 LAB — LIPID PANEL
Chol/HDL Ratio: 7.7 ratio units — ABNORMAL HIGH (ref 0.0–4.4)
Cholesterol, Total: 240 mg/dL — ABNORMAL HIGH (ref 100–199)
HDL: 31 mg/dL — ABNORMAL LOW (ref >39)
LDL CALC: 173 mg/dL — AB (ref 0–99)
Triglycerides: 179 mg/dL — ABNORMAL HIGH (ref 0–149)
VLDL CHOLESTEROL CAL: 36 mg/dL (ref 5–40)

## 2014-07-01 LAB — CBC WITH DIFFERENTIAL/PLATELET
BASOS: 1 %
Basophils Absolute: 0 10*3/uL (ref 0.0–0.2)
Eos: 5 %
Eosinophils Absolute: 0.4 10*3/uL (ref 0.0–0.4)
HEMATOCRIT: 42.8 % (ref 34.0–46.6)
Hemoglobin: 13.8 g/dL (ref 11.1–15.9)
Immature Grans (Abs): 0 10*3/uL (ref 0.0–0.1)
Immature Granulocytes: 0 %
Lymphocytes Absolute: 2.3 10*3/uL (ref 0.7–3.1)
Lymphs: 27 %
MCH: 29.8 pg (ref 26.6–33.0)
MCHC: 32.2 g/dL (ref 31.5–35.7)
MCV: 92 fL (ref 79–97)
MONOCYTES: 9 %
Monocytes Absolute: 0.7 10*3/uL (ref 0.1–0.9)
NEUTROS ABS: 4.9 10*3/uL (ref 1.4–7.0)
Neutrophils Relative %: 58 %
Platelets: 271 10*3/uL (ref 150–379)
RBC: 4.63 x10E6/uL (ref 3.77–5.28)
RDW: 13.2 % (ref 12.3–15.4)
WBC: 8.5 10*3/uL (ref 3.4–10.8)

## 2014-07-01 LAB — COMPREHENSIVE METABOLIC PANEL
A/G RATIO: 1.5 (ref 1.1–2.5)
ALBUMIN: 4.3 g/dL (ref 3.5–5.5)
ALT: 24 IU/L (ref 0–32)
AST: 19 IU/L (ref 0–40)
Alkaline Phosphatase: 62 IU/L (ref 39–117)
BILIRUBIN TOTAL: 0.3 mg/dL (ref 0.0–1.2)
BUN/Creatinine Ratio: 22 (ref 9–23)
BUN: 20 mg/dL (ref 6–24)
CHLORIDE: 100 mmol/L (ref 97–108)
CO2: 24 mmol/L (ref 18–29)
Calcium: 10 mg/dL (ref 8.7–10.2)
Creatinine, Ser: 0.89 mg/dL (ref 0.57–1.00)
GFR calc non Af Amer: 71 mL/min/{1.73_m2} (ref >59)
GFR, EST AFRICAN AMERICAN: 82 mL/min/{1.73_m2} (ref >59)
Globulin, Total: 2.8 g/dL (ref 1.5–4.5)
Glucose: 147 mg/dL — ABNORMAL HIGH (ref 65–99)
Potassium: 4 mmol/L (ref 3.5–5.2)
SODIUM: 142 mmol/L (ref 134–144)
TOTAL PROTEIN: 7.1 g/dL (ref 6.0–8.5)

## 2014-07-01 LAB — HEMOGLOBIN A1C
ESTIMATED AVERAGE GLUCOSE: 171 mg/dL
Hgb A1c MFr Bld: 7.6 % — ABNORMAL HIGH (ref 4.8–5.6)

## 2014-07-01 LAB — TSH: TSH: 1.77 u[IU]/mL (ref 0.450–4.500)

## 2014-07-01 LAB — T4, FREE: Free T4: 2.26 ng/dL — ABNORMAL HIGH (ref 0.82–1.77)

## 2014-07-02 ENCOUNTER — Encounter: Payer: Self-pay | Admitting: Internal Medicine

## 2014-07-03 ENCOUNTER — Telehealth: Payer: Self-pay

## 2014-07-03 DIAGNOSIS — E039 Hypothyroidism, unspecified: Secondary | ICD-10-CM

## 2014-07-03 DIAGNOSIS — E785 Hyperlipidemia, unspecified: Secondary | ICD-10-CM

## 2014-07-03 MED ORDER — ROSUVASTATIN CALCIUM 40 MG PO TABS: 40.0000 mg | ORAL_TABLET | Freq: Every day | ORAL | Status: DC

## 2014-07-03 MED ORDER — METFORMIN HCL 500 MG PO TABS: 500.0000 mg | ORAL_TABLET | Freq: Every day | ORAL | Status: DC

## 2014-07-03 NOTE — Telephone Encounter (Signed)
Blood counts are nml; thyroid is overactive- reduce levothyroxine 159mcg #30 take 1 po daily with 2 RF; reck TSH and free T4 in 6 weeks; BS in diabetic range (and uncontrolled)- refer to diabetic education for initial training; she will need glucometer/lancets/test strips to check sugars BID; start Metformin 500mg  #30 take 1 po daily with 4 RF; cholesterol is above goal and need to get LDL/bad cholesterol < 70- continue crestor as rx every day; good/HDL cholesterol low and need to increase aerobic exercise; watch fatty foods  Discussed with patient, scheduled appointment for labs (placed future orders) and appointment with Oretha Ellis.  RX's sent to the pharmacy.

## 2014-07-04 ENCOUNTER — Ambulatory Visit (HOSPITAL_COMMUNITY): Payer: Medicare Other | Attending: Internal Medicine | Admitting: Cardiology

## 2014-07-04 DIAGNOSIS — Z720 Tobacco use: Secondary | ICD-10-CM | POA: Diagnosis not present

## 2014-07-04 DIAGNOSIS — E669 Obesity, unspecified: Secondary | ICD-10-CM | POA: Insufficient documentation

## 2014-07-04 DIAGNOSIS — R079 Chest pain, unspecified: Secondary | ICD-10-CM | POA: Insufficient documentation

## 2014-07-04 DIAGNOSIS — E785 Hyperlipidemia, unspecified: Secondary | ICD-10-CM | POA: Insufficient documentation

## 2014-07-04 DIAGNOSIS — I10 Essential (primary) hypertension: Secondary | ICD-10-CM | POA: Insufficient documentation

## 2014-07-04 NOTE — Progress Notes (Signed)
Echo performed. 

## 2014-07-05 ENCOUNTER — Other Ambulatory Visit: Payer: Self-pay

## 2014-07-05 ENCOUNTER — Other Ambulatory Visit: Payer: Medicare Other

## 2014-07-05 DIAGNOSIS — M545 Low back pain: Secondary | ICD-10-CM | POA: Diagnosis not present

## 2014-07-05 DIAGNOSIS — G8929 Other chronic pain: Secondary | ICD-10-CM | POA: Diagnosis not present

## 2014-07-05 DIAGNOSIS — Q249 Congenital malformation of heart, unspecified: Secondary | ICD-10-CM

## 2014-07-05 MED ORDER — FLUTICASONE PROPIONATE HFA 44 MCG/ACT IN AERO: INHALATION_SPRAY | RESPIRATORY_TRACT | Status: DC

## 2014-07-05 MED ORDER — LEVOTHYROXINE SODIUM 125 MCG PO TABS: 125.0000 ug | ORAL_TABLET | Freq: Every day | ORAL | Status: DC

## 2014-07-06 LAB — URINALYSIS, ROUTINE W REFLEX MICROSCOPIC
Bilirubin, UA: NEGATIVE
GLUCOSE, UA: NEGATIVE
KETONES UA: NEGATIVE
LEUKOCYTES UA: NEGATIVE
Nitrite, UA: NEGATIVE
Specific Gravity, UA: 1.028 (ref 1.005–1.030)
Urobilinogen, Ur: 0.2 mg/dL (ref 0.2–1.0)
pH, UA: 5.5 (ref 5.0–7.5)

## 2014-07-06 LAB — MICROSCOPIC EXAMINATION: CASTS: NONE SEEN /LPF

## 2014-07-17 ENCOUNTER — Encounter: Payer: Self-pay | Admitting: Pharmacotherapy

## 2014-07-17 ENCOUNTER — Ambulatory Visit (INDEPENDENT_AMBULATORY_CARE_PROVIDER_SITE_OTHER): Payer: Medicare Other | Admitting: Pharmacotherapy

## 2014-07-17 VITALS — BP 118/72 | HR 90 | Temp 98.5°F | Resp 20 | Ht 64.0 in | Wt 203.8 lb

## 2014-07-17 DIAGNOSIS — E119 Type 2 diabetes mellitus without complications: Secondary | ICD-10-CM | POA: Diagnosis not present

## 2014-07-17 MED ORDER — METFORMIN HCL 500 MG PO TABS: 500.0000 mg | ORAL_TABLET | Freq: Two times a day (BID) | ORAL | Status: DC

## 2014-07-17 MED ORDER — ONETOUCH DELICA LANCETS 33G MISC
1.0000 | Freq: Two times a day (BID) | Status: DC
Start: 2014-07-17 — End: 2015-06-13

## 2014-07-17 MED ORDER — GLUCOSE BLOOD VI STRP: ORAL_STRIP | Status: DC

## 2014-07-17 NOTE — Progress Notes (Signed)
  Subjective:    Grace [name] is a 59 y.o.white female who presents for follow-up of Type 2 diabetes mellitus.   New diagnosis of DM2.  A1C was 7.6% Denies family history of DM.  Denies blurry vision. Denies polyuria, polyphagia. Has had polydipsia.  Not drinking as much regular Pepsi.  Drinks more water and [employer]. Tries not to skip meals.  However, "healthy" food choices are processed sugar, high starch. (Raisin Bran).  Trying to eat more fiber.  Tries to buy "no sugar" products. No routine exercise.  Limited due to back pain.  Denies problems with feet. She does have nocturia 2-3 times per night. No loose stools.  She actually has pre-existing constipation.   Review of Systems A comprehensive review of systems was negative except for: Genitourinary: positive for nocturia Endocrine: positive for diabetic symptoms including polydipsia    Objective:    BP 118/72 mmHg  Pulse 90  Temp(Src) 98.5 F (36.9 C) (Oral)  Resp 20  Ht 5\' 4"  (1.626 m)  Wt 203 lb 12.8 oz (92.443 kg)  BMI 34.96 kg/m2  SpO2 97%  General:  alert, cooperative and no distress  Oropharynx: normal findings: lips normal without lesions and gums healthy   Eyes:  negative findings: lids and lashes normal and conjunctivae and sclerae normal   Ears:  external ears normal        Lung: clear to auscultation bilaterally  Heart:  regular rate and rhythm     Extremities: no edema noted  Skin: warm and dry, no hyperpigmentation, vitiligo, or suspicious lesions     Neuro: mental status, speech normal, alert and oriented x3 and gait and station normal   Lab Review GLUCOSE (mg/dL)  Date Value  06/30/2014 147*   GLUCOSE, BLD (mg/dL)  Date Value  03/15/2013 102*  12/25/2011 97  11/13/2011 96   CO2  Date Value  06/30/2014 24 mmol/L  03/15/2013 32 mEq/L  12/25/2011 28 mEq/L   BUN (mg/dL)  Date Value  06/30/2014 20  03/15/2013 16  12/25/2011 16  11/13/2011 17   CREAT (mg/dL)  Date Value   03/15/2013 0.87  12/25/2011 0.97  11/13/2011 1.15*   CREATININE, SER (mg/dL)  Date Value  06/30/2014 0.89  06/26/2009 0.82  01/10/2009 0.99       Assessment:    Diabetes Mellitus type II, under fair control. A1C above goal <7% BP at goal <140/90   Plan:    1.  Rx changes: Increase Metformin 500mg  BID. 2.  Counseled on complications of uncontrolled DM. 3.  Counseled on insulin resistance. 4.  Counseled on meal planning and portion control.  Provided written materials to support education. 5.  Counseled on need for routine exercise.  Goal is 30-45 minutes 5 x week.  Provided written materials to support education.  Recommended she take advantage of her "silver sneakers" program. 6.  Provided One Touch Verio BGM and instructed on use. 7.  BP at goal <140/90.   Continue lisinopril/HCTZ and metoprolol. 8.  Has physical with Dr. Eulas Post in 4 weeks.  I will follow up with her in 8 weeks.

## 2014-07-17 NOTE — Patient Instructions (Signed)
Increase metformin 500mg  twice daily with meals. Monitor blood sugar every morning and at least 3 times per week at random times

## 2014-07-31 ENCOUNTER — Encounter: Payer: Self-pay | Admitting: Gynecology

## 2014-07-31 ENCOUNTER — Ambulatory Visit (INDEPENDENT_AMBULATORY_CARE_PROVIDER_SITE_OTHER): Payer: Medicare Other | Admitting: Gynecology

## 2014-07-31 ENCOUNTER — Other Ambulatory Visit (HOSPITAL_COMMUNITY)
Admission: RE | Admit: 2014-07-31 | Discharge: 2014-07-31 | Disposition: A | Discharge status: Home / Self Care | Payer: Medicare Other | Source: Ambulatory Visit | Attending: Gynecology | Admitting: Gynecology

## 2014-07-31 VITALS — BP 124/80 | Ht 63.5 in | Wt 200.0 lb

## 2014-07-31 DIAGNOSIS — Z124 Encounter for screening for malignant neoplasm of cervix: Secondary | ICD-10-CM | POA: Insufficient documentation

## 2014-07-31 DIAGNOSIS — N950 Postmenopausal bleeding: Secondary | ICD-10-CM | POA: Diagnosis not present

## 2014-07-31 DIAGNOSIS — Z87410 Personal history of cervical dysplasia: Secondary | ICD-10-CM

## 2014-07-31 DIAGNOSIS — Z9889 Other specified postprocedural states: Secondary | ICD-10-CM

## 2014-07-31 DIAGNOSIS — D259 Leiomyoma of uterus, unspecified: Secondary | ICD-10-CM | POA: Insufficient documentation

## 2014-07-31 DIAGNOSIS — Z8742 Personal history of other diseases of the female genital tract: Secondary | ICD-10-CM | POA: Insufficient documentation

## 2014-07-31 DIAGNOSIS — Z1151 Encounter for screening for human papillomavirus (HPV): Secondary | ICD-10-CM | POA: Insufficient documentation

## 2014-07-31 DIAGNOSIS — N840 Polyp of corpus uteri: Secondary | ICD-10-CM | POA: Diagnosis not present

## 2014-07-31 NOTE — Progress Notes (Signed)
   Patient is a 59 year old gravida 0 who presented to the office today as a new patient complaining of vaginal bleeding. Patient had previously been followed at the [facility] faculty practice clinic in the last time she was seen by them was in November 2015. Her history was as follows:  "Reported light periods starting 2007, would go up to 12-15 months w/o period, then have few days of light dark bleeding. Significant episode of recent bleeding from 12/23/13 to 01/09/14 with regular constant bleeding, first 4-5 days with heavy red blood, associated with some cramping, then lightened up and had darker spotted. Also reports some light spotting for 3-4 days around 01/22/14.Admits to some facial hair growth Denies any significant other menopausal symptoms, without hot flashes. Last Pap Smear 03/2013, negative. (Reported hx cervical dysplasia >20 years ago s/p excision and cryotherapy, no abnormal paps since) Last Pelvic US (transvaginal) - 2008, fibroid uterus, b/l fibroids, endometrial polyp (s/p surgical removal of fibroids) No family history of endometrial, ovarian,  Or cervical cancer".  Patient underwent endometrial biopsy at that time with the following pathology report: Diagnosis 1. Endometrium, biopsy - INACTIVE ENDOMETRIUM, SCANT BENIGN ENDOCERVIX AND SCANT SQUAMOUS FRAGMENTS. NO HYPERPLASIA OR CARCINOMA. 2. Cervix, polyp - BENIGN LOWER SEGMENT ENDOMETRIAL POLYP.  Ultrasound report:  IMPRESSION: Multiple uterine fibroids, measuring up to 5.3 cm, as described above.  Endometrial complex measures 13 mm, considered abnormal in the setting of postmenopausal bleeding. Prior endometrial ablation.  In the setting of post-menopausal bleeding, endometrial sampling is indicated to exclude carcinoma. If results are benign, sonohysterogram should be considered for focal lesion work-up.  Patient had no follow-up and has been taking Megace 40 mg 3 times a day at times and continues to  bleed. Her recent CBC last month demonstrated a hemoglobin of 13.8 with a hematocrit 42.8 and platelet count have to  271,000. Last colonoscopy reportedly normal 3 years ago. Last mammogram in 2013 normal.  Exam: Blood pressure 124/80   Weight 200 pounds   Height 5 feet 3-1/2 inches tall   BMI 34.87 kg from meter square  Gen. Appearance: Well-developed well-nourished female complaining of vaginal bleeding Abdomen pendulous soft nontender no rebound or guarding Pelvic: Bartholin urethra Skene was within normal limits Vagina: Some blood was noted in the vaginal vault Cervix: No active bleeding no cervical polyp Bimanual exam limited due to patient's abdominal girth but elicited no pelvic pain. Rectal exam: Not done   Pap smear with HPV screen obtained  The patient has not been sexually active for numerous years. Patient was counseled for an endometrial biopsy. The cervix was cleansed with Betadine solution. A single-tooth tenaculum was placed on the anterior cervical lip. A sterile Pipelle was introduced into the uterine cavity.  Copious amount of tissue was obtained and submitted for histological evaluation. A single-tooth tenaculum was removed there was no active bleeding. Patient was given 2 Aleve for cramping.  Assessment/plan: Overweight Caucasian female with postmenopausal bleeding despite on Megace copious amount of tissue obtained at time of endometrial biopsy rule out endometrial hyperplasia or endometrial cancer. If no endometrial cancer or atypicality noted on the specimen she will be schedule next week for sonohysterogram. A Pap smear was done today with HPV screening.patient was provided with a requisition to schedule mammogram which is overdue.

## 2014-07-31 NOTE — Patient Instructions (Signed)
Endometrial Biopsy, Care After Refer to this sheet in the next few weeks. These instructions provide you with information on caring for yourself after your procedure. Your health care provider may also give you more specific instructions. Your treatment has been planned according to current medical practices, but problems sometimes occur. Call your health care provider if you have any problems or questions after your procedure. WHAT TO EXPECT AFTER THE PROCEDURE After your procedure, it is typical to have the following:  You may have mild cramping and a small amount of vaginal bleeding for a few days after the procedure. This is normal. HOME CARE INSTRUCTIONS  Only take over-the-counter or prescription medicine as directed by your health care provider.  Do not douche, use tampons, or have sexual intercourse until your health care provider approves.  Follow your health care provider's instructions regarding any activity restrictions, such as strenuous exercise or heavy lifting. SEEK MEDICAL CARE IF:  You have heavy bleeding or bleeding longer than 2 days after the procedure.  You have bad smelling drainage from your vagina.  You have a fever and chills.  Youhave severe lower stomach (abdominal) pain. SEEK IMMEDIATE MEDICAL CARE IF:  You have severe cramps in your stomach or back.  You pass large blood clots.  Your bleeding increases.  You become weak or lightheaded, or you pass out. Document Released: 01/19/2013 Document Reviewed: 01/19/2013 ExitCare Patient Information 2015 [employer]. This information is not intended to replace advice given to you by your health care provider. Make sure you discuss any questions you have with your health care provider.  

## 2014-08-01 ENCOUNTER — Other Ambulatory Visit: Payer: Self-pay

## 2014-08-01 DIAGNOSIS — Z1231 Encounter for screening mammogram for malignant neoplasm of breast: Secondary | ICD-10-CM

## 2014-08-02 ENCOUNTER — Other Ambulatory Visit: Payer: Self-pay | Admitting: Gynecology

## 2014-08-02 ENCOUNTER — Other Ambulatory Visit: Payer: Self-pay | Admitting: *Deleted

## 2014-08-02 ENCOUNTER — Other Ambulatory Visit: Payer: Self-pay | Admitting: Internal Medicine

## 2014-08-02 DIAGNOSIS — D251 Intramural leiomyoma of uterus: Secondary | ICD-10-CM

## 2014-08-02 DIAGNOSIS — N950 Postmenopausal bleeding: Secondary | ICD-10-CM

## 2014-08-02 LAB — CYTOLOGY - PAP

## 2014-08-02 MED ORDER — METOPROLOL TARTRATE 25 MG PO TABS: 25.0000 mg | ORAL_TABLET | Freq: Two times a day (BID) | ORAL | Status: DC

## 2014-08-04 ENCOUNTER — Encounter: Payer: Medicare Other | Admitting: Internal Medicine

## 2014-08-04 ENCOUNTER — Telehealth: Payer: Self-pay | Admitting: Gynecology

## 2014-08-04 ENCOUNTER — Ambulatory Visit (INDEPENDENT_AMBULATORY_CARE_PROVIDER_SITE_OTHER): Payer: Medicare Other | Admitting: Internal Medicine

## 2014-08-04 ENCOUNTER — Encounter: Payer: Self-pay | Admitting: Internal Medicine

## 2014-08-04 VITALS — BP 100/76 | HR 92 | Temp 97.7°F | Resp 18 | Ht 64.0 in | Wt 198.8 lb

## 2014-08-04 DIAGNOSIS — N950 Postmenopausal bleeding: Secondary | ICD-10-CM | POA: Diagnosis not present

## 2014-08-04 DIAGNOSIS — E038 Other specified hypothyroidism: Secondary | ICD-10-CM

## 2014-08-04 DIAGNOSIS — Z0000 Encounter for general adult medical examination without abnormal findings: Secondary | ICD-10-CM | POA: Diagnosis not present

## 2014-08-04 DIAGNOSIS — E785 Hyperlipidemia, unspecified: Secondary | ICD-10-CM

## 2014-08-04 DIAGNOSIS — E119 Type 2 diabetes mellitus without complications: Secondary | ICD-10-CM | POA: Diagnosis not present

## 2014-08-04 DIAGNOSIS — J45909 Unspecified asthma, uncomplicated: Secondary | ICD-10-CM

## 2014-08-04 DIAGNOSIS — I10 Essential (primary) hypertension: Secondary | ICD-10-CM | POA: Diagnosis not present

## 2014-08-04 DIAGNOSIS — G8929 Other chronic pain: Secondary | ICD-10-CM

## 2014-08-04 DIAGNOSIS — E034 Atrophy of thyroid (acquired): Secondary | ICD-10-CM | POA: Diagnosis not present

## 2014-08-04 DIAGNOSIS — M545 Low back pain, unspecified: Secondary | ICD-10-CM

## 2014-08-04 MED ORDER — METOPROLOL TARTRATE 25 MG PO TABS: 25.0000 mg | ORAL_TABLET | Freq: Two times a day (BID) | ORAL | Status: DC

## 2014-08-04 MED ORDER — ALBUTEROL SULFATE HFA 108 (90 BASE) MCG/ACT IN AERS: 2.0000 | INHALATION_SPRAY | Freq: Four times a day (QID) | RESPIRATORY_TRACT | Status: DC | PRN

## 2014-08-04 NOTE — Progress Notes (Signed)
Patient ID: Grace [name], female   DOB: 1955/04/16, 59 y.o.   MRN: [id number]    Facility  [facility]    Place of Service:   OFFICE    Allergies  Allergen Reactions  . Codeine     REACTION: headaches    Chief Complaint  Patient presents with  . Annual Exam    annual exam, asked about her pro air     HPI:  59 yo female seen today for annual exam. She reports feeling well overall. She has reduced complex carbs and increased exercise. She has lost about 14 lbs since Nov 2015. She is active at home.  She has asthma and it has been stable for the most part. Yesterday, she coughed all night after mowing the lawn. She did not wear a mask. Needs RF for proair  BP stable on lisinoprtil-HCT  BS elevated in the PM some nights. BS 150-190s when it is elevated. She maintains a healthy diet and eats a lot of salads. She is c/a diarrhea on metformin BID dosing. No low BS reactions.  She saw GYN for postmenopausal bleeding and had endometrial bx. She has pelvic US pending.   She has a mammogram scheduled on Aug 23, 2014. Colonoscopy done Oct 2011. She has not had a diabetic eye exam or foot exam in the last year. Last ECG done 11/2009  Past Medical History  Diagnosis Date  . GERD (gastroesophageal reflux disease)   . Hyperlipidemia   . Hypertension   . Migraine   . COPD (chronic obstructive pulmonary disease)   . Hypothyroidism     with hx of multinodular goiter (noted on neck US in 11/2002 - showing diffuse nodularity and inhomogenous texture diffusely BL)  . Asthma   . Hiatal hernia   . Barrett's esophagus     with high grade dysplasia per endoscopy 05/2005 // followed by Dr. [name] ([facility])  . History of CVA (cerebrovascular accident) 10/2000  . Endometrial polyp 03/2003    s/p resection in 03/2003. Path showing submucosal leiomyoma and benign  proliferative type endometrium  . Duodenal ulcer disease     thought to be contributed at least partly by overuse of NSAIDs and  salicylates (goody powdr)  . Internal hemorrhoids     grade 2 per colonoscopy in 09/2006 - repeat colonoscopy rec in 5-10 years.  . 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 Fibroid uterus     s/p myomectomy x 2  . Tachycardia   . Nevus   . H/O failed moderate sedation   . Personal history of failed conscious sedation 08/11/2011  . Thyroid disease   . Chronic back pain   . Abnormal vaginal bleeding   . Tobacco abuse     4-5 cigs/day   Past Surgical History  Procedure Laterality Date  . Cholecystectomy    . Myomectomy  1990, 1997    x 2 - In 1997, noted to have extensive pelvic adhesions and BL tubal obstruction  . Colonoscopy  02/07/2010  . Upper gastrointestinal endoscopy  12/18/2009  . Hysteroscopy     History   Social History  . Marital Status: Divorced    Spouse Name: N/A  . Number of Children: N/A  . Years of Education: N/A   Social History Main Topics  . Smoking status: Current Every Day Smoker -- 0.25 packs/day for  42 years    Types: Cigarettes    Last Attempt to Quit: 05/09/2012  . Smokeless tobacco: Never Used     Comment: Counseling sheet [id number]   . Alcohol Use: No  . Drug Use: No  . Sexual Activity: No   Other Topics Concern  . None   Social History Narrative         Financial assistance approved for 100% discount at [facility] and has [facility] card per [employer]   01/15/2010      Diet- N/A   Caffeine- Goody powders   Married-Divorced   [employer] and lives with mother   Pets-2   Current/past profession-[employer]   Exercise-No   Living will-No   DNR-No   POA/HPOA-No              Medications: Patient's Medications  New Prescriptions   No medications on file  Previous Medications   ASPIRIN-ACETAMINOPHEN-CAFFEINE 500-325-65 MG PACK    Take by mouth as needed.   BIOTIN 1000 MCG TABLET    Take 1,000 mcg by mouth 3 (three) times daily.   CYCLOBENZAPRINE (FLEXERIL)  10 MG TABLET    Take 10 mg by mouth as needed for muscle spasms.   DIPHENHYDRAMINE (BENADRYL) 25 MG TABLET    Take 25 mg by mouth as needed.   FLUTICASONE (FLOVENT HFA) 44 MCG/ACT INHALER    Inhale 2 puffs into the lungs two times daily   GLUCOSE BLOOD (ONETOUCH VERIO) TEST STRIP    Use to self monitor blood glucose twice daily and as directed (E11.9)   IBUPROFEN (ADVIL,MOTRIN) 200 MG TABLET    Take 200 mg by mouth as needed.   LEVOTHYROXINE (SYNTHROID, LEVOTHROID) 125 MCG TABLET    Take 1 tablet (125 mcg total) by mouth daily before breakfast.   LISINOPRIL-HYDROCHLOROTHIAZIDE (PRINZIDE,ZESTORETIC) 10-12.5 MG PER TABLET    Take 1 tablet by mouth daily.   MEGESTROL (MEGACE) 40 MG TABLET    Take 1 tablet (40 mg total) by mouth 3 (three) times daily as needed (heavy vaginal bleeding).   MELATONIN 10 MG CAPS    Take by mouth at bedtime.   METFORMIN (GLUCOPHAGE) 500 MG TABLET    Take 1 tablet (500 mg total) by mouth 2 (two) times daily with a meal.   METOPROLOL TARTRATE (LOPRESSOR) 25 MG TABLET    Take 1 tablet (25 mg total) by mouth 2 (two) times daily.   ONETOUCH DELICA LANCETS 44I MISC    1 each by Does not apply route 2 (two) times daily.   RANITIDINE (ZANTAC) 150 MG TABLET    Take 150 mg by mouth 2 (two) times daily.   ROSUVASTATIN (CRESTOR) 40 MG TABLET    Take 1 tablet (40 mg total) by mouth daily.   VALERIAN ROOT PO    Take by mouth at bedtime as needed.  Modified Medications   No medications on file  Discontinued Medications   No medications on file     Review of Systems  Constitutional: Negative for fever, chills, diaphoresis, activity change, appetite change and fatigue.  HENT: Negative for ear pain and sore throat.   Eyes: Negative for visual disturbance.  Respiratory: Positive for shortness of breath. Negative for cough and chest tightness.   Cardiovascular: Negative for chest pain, palpitations and leg swelling.  Gastrointestinal: Negative for nausea, vomiting, abdominal pain,  diarrhea, constipation and blood in stool.  Genitourinary: Negative for dysuria.  Musculoskeletal: Positive for arthralgias.  Neurological: Negative for dizziness, tremors, numbness and headaches.  Psychiatric/Behavioral:  Negative for sleep disturbance. The patient is not nervous/anxious.     Filed Vitals:   08/04/14 1500  BP: 100/76  Pulse: 92  Temp: 97.7 F (36.5 C)  TempSrc: Oral  Resp: 18  Height: 5\' 4"  (1.626 m)  Weight: 198 lb 12.8 oz (90.175 kg)  SpO2: 95%   Body mass index is 34.11 kg/(m^2).  Physical Exam  Constitutional: She is oriented to person, place, and time. She appears well-developed and well-nourished. No distress.  HENT:  Head: Normocephalic and atraumatic.  Right Ear: Hearing, tympanic membrane, external ear and ear canal normal.  Left Ear: Hearing, tympanic membrane, external ear and ear canal normal.  Mouth/Throat: Uvula is midline, oropharynx is clear and moist and mucous membranes are normal. She does not have dentures.  Eyes: Conjunctivae, EOM and lids are normal. Pupils are equal, round, and reactive to light. No scleral icterus.  Neck: Trachea normal and normal range of motion. Neck supple. Carotid bruit is not present. No thyroid mass and no thyromegaly present.  Cardiovascular: Normal rate, regular rhythm and intact distal pulses.  Exam reveals no gallop and no friction rub.   Murmur (1/6 SEM) heard. No carotid bruit b/l. No LE edema b/l. No calf TTP.   Pulmonary/Chest: Effort normal. She has decreased breath sounds. She has no wheezes. She has no rhonchi. She has no rales. Right breast exhibits mass (lumpy bumpy breasts in upper outer quadrant). Right breast exhibits no nipple discharge, no skin change and no tenderness. Left breast exhibits mass. Left breast exhibits no nipple discharge, no skin change and no tenderness.  Abdominal: Soft. Normal appearance, normal aorta and bowel sounds are normal. She exhibits no pulsatile midline mass and no mass.  There is no hepatosplenomegaly. There is no tenderness. There is no rigidity, no rebound and no guarding. No hernia.  Musculoskeletal: Normal range of motion.  Lymphadenopathy:       Head (right side): No posterior auricular adenopathy present.       Head (left side): No posterior auricular adenopathy present.    She has no cervical adenopathy.       Right: No supraclavicular adenopathy present.       Left: No supraclavicular adenopathy present.  Neurological: She is alert and oriented to person, place, and time. She has normal strength and normal reflexes. No cranial nerve deficit. Gait normal.  Monofilament testing intact b/l. She has b/l bunions and hammertoes. No calluses or ulcerations  Skin: Skin is warm, dry and intact. No rash noted. Nails show no clubbing.  Psychiatric: She has a normal mood and affect. Her speech is normal and behavior is normal. Thought content normal. Cognition and memory are normal.     Labs reviewed: Office Visit on 07/31/2014  Component Date Value Ref Range Status  . CYTOLOGY - PAP 07/31/2014 PAP RESULT   Final  Office Visit on 06/30/2014  Component Date Value Ref Range Status  . WBC 06/30/2014 8.5  3.4 - 10.8 x10E3/uL Final  . RBC 06/30/2014 4.63  3.77 - 5.28 x10E6/uL Final  . Hemoglobin 06/30/2014 13.8  11.1 - 15.9 g/dL Final  . HCT 06/30/2014 42.8  34.0 - 46.6 % Final  . MCV 06/30/2014 92  79 - 97 fL Final  . MCH 06/30/2014 29.8  26.6 - 33.0 pg Final  . MCHC 06/30/2014 32.2  31.5 - 35.7 g/dL Final  . RDW 06/30/2014 13.2  12.3 - 15.4 % Final  . Platelets 06/30/2014 271  150 - 379 x10E3/uL Final  .  Neutrophils Relative % 06/30/2014 58   Final  . Lymphs 06/30/2014 27   Final  . Monocytes 06/30/2014 9   Final  . Eos 06/30/2014 5   Final  . Basos 06/30/2014 1   Final  . Neutrophils Absolute 06/30/2014 4.9  1.4 - 7.0 x10E3/uL Final  . Lymphocytes Absolute 06/30/2014 2.3  0.7 - 3.1 x10E3/uL Final  . Monocytes Absolute 06/30/2014 0.7  0.1 - 0.9 x10E3/uL  Final  . Eosinophils Absolute 06/30/2014 0.4  0.0 - 0.4 x10E3/uL Final  . Basophils Absolute 06/30/2014 0.0  0.0 - 0.2 x10E3/uL Final  . Immature Granulocytes 06/30/2014 0   Final  . Immature Grans (Abs) 06/30/2014 0.0  0.0 - 0.1 x10E3/uL Final  . Glucose 06/30/2014 147* 65 - 99 mg/dL Final  . BUN 06/30/2014 20  6 - 24 mg/dL Final  . Creatinine, Ser 06/30/2014 0.89  0.57 - 1.00 mg/dL Final  . GFR calc non Af Amer 06/30/2014 71  >59 mL/min/1.73 Final  . GFR calc Af Amer 06/30/2014 82  >59 mL/min/1.73 Final  . BUN/Creatinine Ratio 06/30/2014 22  9 - 23 Final  . Sodium 06/30/2014 142  134 - 144 mmol/L Final  . Potassium 06/30/2014 4.0  3.5 - 5.2 mmol/L Final  . Chloride 06/30/2014 100  97 - 108 mmol/L Final  . CO2 06/30/2014 24  18 - 29 mmol/L Final  . Calcium 06/30/2014 10.0  8.7 - 10.2 mg/dL Final  . Total Protein 06/30/2014 7.1  6.0 - 8.5 g/dL Final  . Albumin 06/30/2014 4.3  3.5 - 5.5 g/dL Final  . Globulin, Total 06/30/2014 2.8  1.5 - 4.5 g/dL Final  . Albumin/Globulin Ratio 06/30/2014 1.5  1.1 - 2.5 Final  . Bilirubin Total 06/30/2014 0.3  0.0 - 1.2 mg/dL Final  . Alkaline Phosphatase 06/30/2014 62  39 - 117 IU/L Final  . AST 06/30/2014 19  0 - 40 IU/L Final  . ALT 06/30/2014 24  0 - 32 IU/L Final  . TSH 06/30/2014 1.770  0.450 - 4.500 uIU/mL Final  . Free T4 06/30/2014 2.26* 0.82 - 1.77 ng/dL Final  . Specific Gravity, UA 07/05/2014 1.028  1.005 - 1.030 Final  . pH, UA 07/05/2014 5.5  5.0 - 7.5 Final  . Color, UA 07/05/2014 Yellow  Yellow Final  . Appearance Ur 07/05/2014 Cloudy* Clear Final  . Leukocytes, UA 07/05/2014 Negative  Negative Final  . Protein, UA 07/05/2014 1+* Negative/Trace Final  . Glucose, UA 07/05/2014 Negative  Negative Final  . Ketones, UA 07/05/2014 Negative  Negative Final  . RBC, UA 07/05/2014 3+* Negative Final  . Bilirubin, UA 07/05/2014 Negative  Negative Final  . Urobilinogen, Ur 07/05/2014 0.2  0.2 - 1.0 mg/dL Final  . Nitrite, UA 07/05/2014  Negative  Negative Final  . Microscopic Examination 07/05/2014 See below:   Final   Microscopic was indicated and was performed.  . Cholesterol, Total 06/30/2014 240* 100 - 199 mg/dL Final  . Triglycerides 06/30/2014 179* 0 - 149 mg/dL Final  . HDL 06/30/2014 31* >39 mg/dL Final   Comment: According to ATP-III Guidelines, HDL-C >59 mg/dL is considered a negative risk factor for CHD.   Marland Kitchen VLDL Cholesterol Cal 06/30/2014 36  5 - 40 mg/dL Final  . LDL Calculated 06/30/2014 173* 0 - 99 mg/dL Final  . Chol/HDL Ratio 06/30/2014 7.7* 0.0 - 4.4 ratio units Final   Comment:  T. Chol/HDL Ratio                                             Men  Women                               1/2 Avg.Risk  3.4    3.3                                   Avg.Risk  5.0    4.4                                2X Avg.Risk  9.6    7.1                                3X Avg.Risk 23.4   11.0   . Hgb A1c MFr Bld 06/30/2014 7.6* 4.8 - 5.6 % Final   Comment:          Pre-diabetes: 5.7 - 6.4          Diabetes: >6.4          Glycemic control for adults with diabetes: <7.0   . Est. average glucose Bld gHb Est-m* 06/30/2014 171   Final  . WBC, UA 07/05/2014 0-5  0 -  5 /hpf Final  . RBC, UA 07/05/2014 3-10* 0 -  2 /hpf Final  . Epithelial Cells (non renal) 07/05/2014 0-10  0 - 10 /hpf Final  . Casts 07/05/2014 None seen  None seen /lpf Final  . Mucus, UA 07/05/2014 Present  Not Estab. Final  . Bacteria, UA 07/05/2014 Few  None seen/Few Final     Assessment/Plan   ICD-9-CM ICD-10-CM   1. Well adult exam V70.0 Z00.00   2. Type 2 diabetes mellitus without complication - uncontrolled but has had frequent loose stools on metformin BID 250.00 E11.9 Microalbumin / creatinine urine ratio  3. Hypothyroidism due to acquired atrophy of thyroid - stable on levothyroxine 244.8 E03.8    246.8 E03.4   4. Chronic low back pain stable on meds 724.2 M54.5    338.29 G89.29   5. Post-menopause bleeding - s/p  endometrial bx 627.1 N95.0   6. Asthma, unspecified asthma severity, uncomplicated - stable on HFAs 493.90 J45.909   7. Essential hypertension - controlled on meds 8. Hyperlipidemia - not at goal; cont statin; low fat/low cholesterol diet  --reduce metformin to 1 daily due to frequent loose stools. Maintain tight glycemic control. F/u with diabetic educator.   --Rx written for proair HFA to use prn.  --continue other medications as ordered  --f/u with GYN as scheduled  --Pt is UTD on health maintenance. Vaccinations are UTD. Pt maintains a healthy lifestyle. Encouraged pt to exercise 30-45 minutes 4-5 times per week. Eat a well balanced diet. Avoid smoking. Limit alcohol intake. Wear seatbelt when riding in the car. Wear sun block (SPF >50) when spending extended times outside.  --f/u in 3 mos for DM, asthma, hypothyroidism, HTN and hyperlipidemia.  Lochlann Mastrangelo [facility] [address]  [place], [place] [address] [phone] Office (Wednesdays and Fridays 8 AM - 5 PM) [phone] Cell (Monday-Friday 8 AM - 5 PM)

## 2014-08-04 NOTE — Patient Instructions (Addendum)
Reduce metformin to 1 daily  Encouraged pt to exercise 30-45 minutes 4-5 times per week. Eat a well balanced diet. Avoid smoking. Limit alcohol intake. Wear seatbelt when riding in the car. Wear sun block (SPF >50) when spending extended times outside.  Follow up in 3 mos

## 2014-08-04 NOTE — Telephone Encounter (Signed)
08/04/14-I called pt and told her that her [employer] ins said they will cover the test under her $35.00 copay, no deductible or coins. She was told by member services that she might have to pay 20%. She understands if she has more than the $35 copay she can be put on a payment plan once the claim was processed by her insurance.wl

## 2014-08-06 ENCOUNTER — Encounter: Payer: Self-pay | Admitting: Internal Medicine

## 2014-08-08 ENCOUNTER — Other Ambulatory Visit: Payer: Medicare Other

## 2014-08-08 DIAGNOSIS — E039 Hypothyroidism, unspecified: Secondary | ICD-10-CM | POA: Diagnosis not present

## 2014-08-08 DIAGNOSIS — E119 Type 2 diabetes mellitus without complications: Secondary | ICD-10-CM | POA: Diagnosis not present

## 2014-08-09 ENCOUNTER — Other Ambulatory Visit: Payer: Medicare Other

## 2014-08-09 ENCOUNTER — Ambulatory Visit: Payer: Medicare Other | Admitting: Gynecology

## 2014-08-09 LAB — TSH: TSH: 1.85 u[IU]/mL (ref 0.450–4.500)

## 2014-08-09 LAB — T4, FREE: FREE T4: 1.85 ng/dL — AB (ref 0.82–1.77)

## 2014-08-09 LAB — MICROALBUMIN / CREATININE URINE RATIO
Creatinine, Urine: 112.2 mg/dL (ref 15.0–278.0)
MICROALB/CREAT RATIO: 147.5 mg/g{creat} — AB (ref 0.0–30.0)
Microalbumin, Urine: 165.5 ug/mL — ABNORMAL HIGH (ref 0.0–17.0)

## 2014-08-14 ENCOUNTER — Other Ambulatory Visit: Payer: Self-pay

## 2014-08-17 ENCOUNTER — Encounter: Payer: Self-pay | Admitting: Cardiovascular Disease

## 2014-08-17 ENCOUNTER — Ambulatory Visit (INDEPENDENT_AMBULATORY_CARE_PROVIDER_SITE_OTHER): Payer: Medicare Other | Admitting: Cardiovascular Disease

## 2014-08-17 VITALS — BP 90/60 | HR 85 | Ht 64.0 in | Wt 193.6 lb

## 2014-08-17 DIAGNOSIS — Q211 Atrial septal defect: Secondary | ICD-10-CM | POA: Diagnosis not present

## 2014-08-17 DIAGNOSIS — Q2112 Patent foramen ovale: Secondary | ICD-10-CM

## 2014-08-17 NOTE — Patient Instructions (Signed)
Medication Instructions:  Your physician recommends that you continue on your current medications as directed. Please refer to the Current Medication list given to you today.   Labwork: None  Testing/Procedures: None  Follow-Up: Your physician recommends that you schedule a follow-up appointment in: as needed with Dr. Nahser      

## 2014-08-17 NOTE — Progress Notes (Signed)
Cardiology Office Note   Date:  08/17/2014   ID:  Grace [name], DOB 02/06/1956, MRN [id number]  PCP:  Gildardo Cranker, DO  Cardiologist:   Acie Fredrickson Wonda Cheng, MD   Chief Complaint  Patient presents with  . Advice Only    possible PFO on echo   Problem list: 1. Essential hypertension 2. History of stroke 3. COPD 4. Diabetes mellitus    History of Present Illness: Grace [name] is a 58 y.o. female who presents for evaluation of possible PFO. She has had some episodes of CP.  Refused to have a nuc med stress test, agreed to have an echo Echo showed a possible PFO No symptoms Hx of  CVA in 2001.  hx of COPD, still smokers.  Does not exercise.  Just joined the [employer] to lose weight . + family hx - father had irreg. Heart beat.    Past Medical History  Diagnosis Date  . GERD (gastroesophageal reflux disease)   . Hyperlipidemia   . Hypertension   . Migraine   . COPD (chronic obstructive pulmonary disease)   . Hypothyroidism     with hx of multinodular goiter (noted on neck US in 11/2002 - showing diffuse nodularity and inhomogenous texture diffusely BL)  . Asthma   . Hiatal hernia   . Barrett's esophagus     with high grade dysplasia per endoscopy 05/2005 // followed by Dr. [name] ([facility])  . History of CVA (cerebrovascular accident) 10/2000  . Endometrial polyp 03/2003    s/p resection in 03/2003. Path showing submucosal leiomyoma and benign  proliferative type endometrium  . Duodenal ulcer disease     thought to be contributed at least partly by overuse of NSAIDs and salicylates (goody powdr)  . Internal hemorrhoids     grade 2 per colonoscopy in 09/2006 - repeat colonoscopy rec in 5-10 years.  . 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 Fibroid uterus     s/p myomectomy x 2  . Tachycardia   . Nevus   . H/O failed moderate sedation   . Personal history of failed  conscious sedation 08/11/2011  . Thyroid disease   . Chronic back pain   . Abnormal vaginal bleeding   . Tobacco abuse Jan 2014    quit    Past Surgical History  Procedure Laterality Date  . Cholecystectomy    . Myomectomy  1990, 1997    x 2 - In 1997, noted to have extensive pelvic adhesions and BL tubal obstruction  . Colonoscopy  02/07/2010  . Upper gastrointestinal endoscopy  12/18/2009  . Hysteroscopy       Current Outpatient Prescriptions  Medication Sig Dispense Refill  . albuterol (PROAIR HFA) 108 (90 BASE) MCG/ACT inhaler Inhale 2 puffs into the lungs every 6 (six) hours as needed for wheezing or shortness of breath. 1 Inhaler 6  . Aspirin-Acetaminophen-Caffeine [phone] MG PACK Take by mouth as needed.    . Biotin 1000 MCG tablet Take 1,000 mcg by mouth 3 (three) times daily.    . cyclobenzaprine (FLEXERIL) 10 MG tablet Take 10 mg by mouth as needed for muscle spasms.    . diphenhydrAMINE (BENADRYL) 25 MG tablet Take 25 mg by mouth as needed.    . fluticasone (FLOVENT HFA) 44 MCG/ACT inhaler Inhale 2 puffs into the lungs two times daily 10.6  g 2  . glucose blood (ONETOUCH VERIO) test strip Use to self monitor blood glucose twice daily and as directed (E11.9) 100 each 6  . ibuprofen (ADVIL,MOTRIN) 200 MG tablet Take 200 mg by mouth as needed.    Marland Kitchen levothyroxine (SYNTHROID, LEVOTHROID) 125 MCG tablet Take 1 tablet (125 mcg total) by mouth daily before breakfast. 30 tablet 2  . lisinopril-hydrochlorothiazide (PRINZIDE,ZESTORETIC) 10-12.5 MG per tablet Take 1 tablet by mouth daily. 30 tablet 6  . megestrol (MEGACE) 40 MG tablet Take 1 tablet (40 mg total) by mouth 3 (three) times daily as needed (heavy vaginal bleeding). 60 tablet 5  . Melatonin 10 MG CAPS Take by mouth at bedtime.    . metFORMIN (GLUCOPHAGE) 500 MG tablet Take 1 tablet (500 mg total) by mouth 2 (two) times daily with a meal. 60 tablet 4  . metoprolol tartrate (LOPRESSOR) 25 MG tablet Take 1 tablet (25 mg  total) by mouth 2 (two) times daily. 60 tablet 3  . ONETOUCH DELICA LANCETS 27O MISC 1 each by Does not apply route 2 (two) times daily. 100 each 6  . ranitidine (ZANTAC) 150 MG tablet Take 150 mg by mouth 2 (two) times daily.    . rosuvastatin (CRESTOR) 40 MG tablet Take 1 tablet (40 mg total) by mouth daily. 30 tablet 4  . VALERIAN ROOT PO Take by mouth at bedtime as needed.    . [DISCONTINUED] ipratropium (ATROVENT HFA) 17 MCG/ACT inhaler Inhale 2 puffs into the lungs 4 (four) times daily. 1 Inhaler 6   No current facility-administered medications for this visit.    Allergies:   Codeine    Social History:  The patient  reports that she has been smoking Cigarettes.  She has a 10.5 pack-year smoking history. She has never used smokeless tobacco. She reports that she does not drink alcohol or use illicit drugs.   Family History:  The patient's family history includes COPD in her father and mother; Heart disease in her father.    ROS:  Please see the history of present illness.    Review of Systems: Constitutional:  denies fever, chills, diaphoresis, appetite change and fatigue.  HEENT: denies photophobia, eye pain, redness, hearing loss, ear pain, congestion, sore throat, rhinorrhea, sneezing, neck pain, neck stiffness and tinnitus.  Respiratory: denies SOB, DOE, cough, chest tightness, and wheezing.  Cardiovascular: denies chest pain, palpitations and leg swelling.  Gastrointestinal: denies nausea, vomiting, abdominal pain, diarrhea, constipation, blood in stool.  Genitourinary: denies dysuria, urgency, frequency, hematuria, flank pain and difficulty urinating.  Musculoskeletal: denies  myalgias, back pain, joint swelling, arthralgias and gait problem.   Skin: denies pallor, rash and wound.  Neurological: denies dizziness, seizures, syncope, weakness, light-headedness, numbness and headaches.   Hematological: denies adenopathy, easy bruising, personal or family bleeding history.    Psychiatric/ Behavioral: denies suicidal ideation, mood changes, confusion, nervousness, sleep disturbance and agitation.       All other systems are reviewed and negative.    PHYSICAL EXAM: VS:  BP 90/60 mmHg  Pulse 85  Ht 5\' 4"  (1.626 m)  Wt 193 lb 9.6 oz (87.816 kg)  BMI 33.21 kg/m2 , BMI Body mass index is 33.21 kg/(m^2). GEN: Well nourished, well developed, in no acute distress HEENT: normal Neck: no JVD, carotid bruits, or masses Cardiac: RRR; no murmurs, rubs, or gallops,no edema  Respiratory:  clear to auscultation bilaterally, normal work of breathing GI: soft, nontender, nondistended, + BS MS: no deformity or atrophy Skin: warm and dry, no  rash Neuro:  Strength and sensation are intact Psych: normal   EKG:  EKG is not ordered today. The ekg ordered today demonstrates    Recent Labs: 06/30/2014: ALT 24; BUN 20; Creatinine 0.89; Hemoglobin 13.8; Platelets 271; Potassium 4.0; Sodium 142 08/08/2014: TSH 1.850    Lipid Panel    Component Value Date/Time   CHOL 240* 06/30/2014 0951   CHOL 226* 03/15/2013 1447   TRIG 179* 06/30/2014 0951   HDL 31* 06/30/2014 0951   HDL 44 03/15/2013 1447   CHOLHDL 7.7* 06/30/2014 0951   CHOLHDL 5.1 03/15/2013 1447   VLDL 57* 03/15/2013 1447   LDLCALC 173* 06/30/2014 0951   LDLCALC 125* 03/15/2013 1447   LDLDIRECT 104* 06/12/2010 1641      Wt Readings from Last 3 Encounters:  08/17/14 193 lb 9.6 oz (87.816 kg)  08/04/14 198 lb 12.8 oz (90.175 kg)  07/31/14 200 lb (90.719 kg)      Other studies Reviewed: Additional studies/ records that were reviewed today include: . Review of the above records demonstrates:    ASSESSMENT AND PLAN:  1. Essential hypertension 2. History of stroke 3. COPD 4. Diabetes mellitus  5. Possible PFO - she was referred here for further evaluation of a possible PFO. I have personally reviewed the echocardiogram myself. She has no enlargement of her right atrium or right ventricle. She has  no symptoms of congestive heart failure. She does have left-to-right flow that may be due to a patent foramen ovale. There is no indication to close this even if we were to find that she had a definite PFO. She does not need any change in medications.  I have encouraged her to take better care of herself. I've encouraged her to stop smoking. She needs to exercise on a regular basis. She needs to lose weight. We will have her follow-up with her general medical doctor.  Current medicines are reviewed at length with the patient today.  The patient does not have concerns regarding medicines.  The following changes have been made:  no change  Labs/ tests ordered today include:  No orders of the defined types were placed in this encounter.     Disposition:   FU with me as needed.       Amariyon Maynes, Wonda Cheng, MD  08/17/2014 11:43 AM    [facility] [place], [place], [place]  [address] Phone: [phone]; Fax: [phone]   [facility]  [address] [place], [place]  [address] [phone]    Fax [phone]

## 2014-08-21 ENCOUNTER — Encounter: Payer: Self-pay | Admitting: Gynecology

## 2014-08-21 ENCOUNTER — Ambulatory Visit (INDEPENDENT_AMBULATORY_CARE_PROVIDER_SITE_OTHER): Payer: Medicare Other | Admitting: Gynecology

## 2014-08-21 ENCOUNTER — Other Ambulatory Visit: Payer: Self-pay | Admitting: Gynecology

## 2014-08-21 ENCOUNTER — Ambulatory Visit (INDEPENDENT_AMBULATORY_CARE_PROVIDER_SITE_OTHER): Payer: Medicare Other

## 2014-08-21 DIAGNOSIS — N950 Postmenopausal bleeding: Secondary | ICD-10-CM | POA: Diagnosis not present

## 2014-08-21 DIAGNOSIS — Z9889 Other specified postprocedural states: Secondary | ICD-10-CM

## 2014-08-21 DIAGNOSIS — D259 Leiomyoma of uterus, unspecified: Secondary | ICD-10-CM | POA: Diagnosis not present

## 2014-08-21 DIAGNOSIS — D251 Intramural leiomyoma of uterus: Secondary | ICD-10-CM

## 2014-08-21 DIAGNOSIS — R9389 Abnormal findings on diagnostic imaging of other specified body structures: Secondary | ICD-10-CM

## 2014-08-21 DIAGNOSIS — N840 Polyp of corpus uteri: Secondary | ICD-10-CM | POA: Diagnosis not present

## 2014-08-21 DIAGNOSIS — N8320 Unspecified ovarian cysts: Secondary | ICD-10-CM

## 2014-08-21 DIAGNOSIS — N9489 Other specified conditions associated with female genital organs and menstrual cycle: Secondary | ICD-10-CM

## 2014-08-21 DIAGNOSIS — N838 Other noninflammatory disorders of ovary, fallopian tube and broad ligament: Secondary | ICD-10-CM

## 2014-08-21 DIAGNOSIS — Z8742 Personal history of other diseases of the female genital tract: Secondary | ICD-10-CM

## 2014-08-21 DIAGNOSIS — R938 Abnormal findings on diagnostic imaging of other specified body structures: Secondary | ICD-10-CM

## 2014-08-21 DIAGNOSIS — N839 Noninflammatory disorder of ovary, fallopian tube and broad ligament, unspecified: Secondary | ICD-10-CM

## 2014-08-21 DIAGNOSIS — N83201 Unspecified ovarian cyst, right side: Secondary | ICD-10-CM

## 2014-08-21 LAB — CBC WITH DIFFERENTIAL/PLATELET
BASOS ABS: 0 10*3/uL (ref 0.0–0.1)
BASOS PCT: 0 % (ref 0–1)
Eosinophils Absolute: 0.3 10*3/uL (ref 0.0–0.7)
Eosinophils Relative: 3 % (ref 0–5)
HEMATOCRIT: 41.4 % (ref 36.0–46.0)
Hemoglobin: 13.8 g/dL (ref 12.0–15.0)
Lymphocytes Relative: 26 % (ref 12–46)
Lymphs Abs: 2.7 10*3/uL (ref 0.7–4.0)
MCH: 29.9 pg (ref 26.0–34.0)
MCHC: 33.3 g/dL (ref 30.0–36.0)
MCV: 89.6 fL (ref 78.0–100.0)
MONOS PCT: 9 % (ref 3–12)
MPV: 11.6 fL (ref 8.6–12.4)
Monocytes Absolute: 0.9 10*3/uL (ref 0.1–1.0)
NEUTROS ABS: 6.4 10*3/uL (ref 1.7–7.7)
Neutrophils Relative %: 62 % (ref 43–77)
PLATELETS: 283 10*3/uL (ref 150–400)
RBC: 4.62 MIL/uL (ref 3.87–5.11)
RDW: 13.7 % (ref 11.5–15.5)
WBC: 10.3 10*3/uL (ref 4.0–10.5)

## 2014-08-21 NOTE — Progress Notes (Signed)
   Patient is a 59 year old presented to the office today for her ongoing evaluation of her postmenopausal bleeding. Patient is a G0 P0 who was seen for the first time as a new patient here in the office on 07/31/2014. Her history as follows:  Patient had previously been followed at the [facility] practice clinic and she was previously seen by them in November 2015 and there notes indicated the following:  "Reported light periods starting 2007, would go up to 12-15 months w/o period, then have few days of light dark bleeding. Significant episode of recent bleeding from 12/23/13 to 01/09/14 with regular constant bleeding, first 4-5 days with heavy red blood, associated with some cramping, then lightened up and had darker spotted. Also reports some light spotting for 3-4 days around 01/22/14.Admits to some facial hair growth Denies any significant other menopausal symptoms, without hot flashes. Last Pap Smear 03/2013, negative. (Reported hx cervical dysplasia >20 years ago s/p excision and cryotherapy, no abnormal paps since) Last Pelvic US (transvaginal) - 2008, fibroid uterus, b/l fibroids, endometrial polyp (s/p surgical removal of fibroids) No family history of endometrial, ovarian, Or cervical cancer".  Patient underwent endometrial biopsy at that time with the following pathology report: Diagnosis 1. Endometrium, biopsy - INACTIVE ENDOMETRIUM, SCANT BENIGN ENDOCERVIX AND SCANT SQUAMOUS FRAGMENTS. NO HYPERPLASIA OR CARCINOMA. 2. Cervix, polyp - BENIGN LOWER SEGMENT ENDOMETRIAL POLYP.  Ultrasound report:  IMPRESSION: Multiple uterine fibroids, measuring up to 5.3 cm, as described above.  Endometrial complex measures 13 mm, considered abnormal in the setting of postmenopausal bleeding. Prior endometrial ablation.  On the last office visit on April 18 she underwent an endometrial biopsy along with a Pap smear. Her Pap smear was normal and her endometrial biopsy demonstrated  the following:  Diagnosis Endometrium, biopsy, uterus - INACTIVE ENDOMETRIUM WITH DIFFUSE PROGESTATIONAL CHANGES AND BENIGN ENDOMETRIAL POLYP. - NO HYPERPLASIA OR CARCINOMA.  Sonohysterogram today: Uterus measured 8 point, 12.8 x 9.6 cm with endometrial stripe of 43.8 mm. Patient had 5 intramural fibroids the largest one measuring 6.2 x 4.8 cm. Thick cystic solid endometrium with positive color flow right ovary thick wall cyst measuring 16 x 10 mm negative color flow left ovary solid focus 23 x 16 mm negative color flow arterial blood flow was seen on both ovaries. Afterwards the cervix was cleansed with Betadine solution and sterile catheter was introduced into the uterine cavity normal saline was instilled several intrauterine defects measuring 5.8 x 3.5 cm, 2.6 x 1.7 cm, 2.6 x 2.5 cm and 2.7 x 1.7 cm was noted.  Assessment/plan: Patient with persistent postmenopausal bleeding since last year currently on Megace. Patient with multiple fibroids intrauterine as well as intramural along with endometrial polyps. Right thick ovarian cyst measuring 16 x 10 mm in the left ovarian solid focus measuring 23 x 16 mm this reason a CA-125 will be ordered today. Patient will continue on her Megace until her surgery. We'll check her CBC today. We'll contact her PCP after Gildardo Cranker for medical clearance as we plan in the next month or 2 to proceed with total abdominal hysterectomy with bilateral salpingo-oophorectomy. Patient reports over 10 years ago that she had a stroke. She has history of hypertension and has a strong past smoking history of COPD as well as history of hypothyroidism and type 2 diabetes. We will wait for medical clearance and schedule accordingly and I will see her the week before surgery.

## 2014-08-21 NOTE — Patient Instructions (Addendum)
Abdominal Hysterectomy Abdominal hysterectomy is a surgical procedure to remove your womb (uterus). Your uterus is the muscular organ that contains a developing baby. This surgery is done for many reasons. You may need an abdominal hysterectomy if you have cancer, growths (tumors), long-term pain, or bleeding. You may also have this procedure if your uterus has slipped down into your vagina (uterine prolapse). Depending on why you need an abdominal hysterectomy, you may also have other reproductive organs removed. These could include the part of your vagina that connects with your uterus (cervix), the organs that make eggs (ovaries), and the tubes that connect the ovaries to the uterus (fallopian tubes). LET YOUR HEALTH CARE PROVIDER KNOW ABOUT:   Any allergies you have.  All medicines you are taking, including vitamins, herbs, eye drops, creams, and over-the-counter medicines.  Previous problems you or members of your family have had with the use of anesthetics.  Any blood disorders you have.  Previous surgeries you have had.  Medical conditions you have. RISKS AND COMPLICATIONS Generally, this is a safe procedure. However, as with any procedure, problems can occur. Infection is the most common problem after an abdominal hysterectomy. Other possible problems include:  Bleeding.  Formation of blood clots that may break free and travel to your lungs.  Injury to other organs near your uterus.  Nerve injury causing nerve pain.  Decreased interest in sex or pain during sexual intercourse. BEFORE THE PROCEDURE  Abdominal hysterectomy is a major surgical procedure. It can affect the way you feel about yourself. Talk to your health care provider about the physical and emotional changes hysterectomy may cause.  You may need to have blood work and X-rays done before surgery.  Quit smoking if you smoke. Ask your health care provider for help if you are struggling to quit.  Stop taking  medicines that thin your blood as directed by your health care provider.  You may be instructed to take antibiotic medicines or laxatives before surgery.  Do not eat or drink anything for 6-8 hours before surgery.  Take your regular medicines with a small sip of water.  Bathe or shower the night or morning before surgery. PROCEDURE  Abdominal hysterectomy is done in the operating room at the hospital.  In most cases, you will be given a medicine that makes you go to sleep (general anesthetic).  The surgeon will make a cut (incision) through the skin in your lower belly.  The incision may be about 5-7 inches long. It may go side-to-side or up-and-down.  The surgeon will move aside the body tissue that covers your uterus. The surgeon will then carefully take out your uterus along with any of your other reproductive organs that need to be removed.  Bleeding will be controlled with clamps or sutures.  The surgeon will close your incision with sutures or metal clips. AFTER THE PROCEDURE  You will have some pain immediately after the procedure.  You will be given pain medicine in the recovery room.  You will be taken to your hospital room when you have recovered from the anesthesia.  You may need to stay in the hospital for 2-5 days.  You will be given instructions for recovery at home. Document Released: 04/05/2013 Document Reviewed: 04/05/2013 [employer] Patient Information 2015 [employer]. This information is not intended to replace advice given to you by your health care provider. Make sure you discuss any questions you have with your health care provider. Uterine Fibroid A uterine fibroid is a   growth (tumor) that occurs in your uterus. This type of tumor is not cancerous and does not spread out of the uterus. You can have one or many fibroids. Fibroids can vary in size, weight, and where they grow in the uterus. Some can become quite large. Most fibroids do not require medical  treatment, but some can cause pain or heavy bleeding during and between periods. CAUSES  A fibroid is the result of a single uterine cell that keeps growing (unregulated), which is different than most cells in the human body. Most cells have a control mechanism that keeps them from reproducing without control.  SIGNS AND SYMPTOMS   Bleeding.  Pelvic pain and pressure.  Bladder problems due to the size of the fibroid.  Infertility and miscarriages depending on the size and location of the fibroid. DIAGNOSIS  Uterine fibroids are diagnosed through a physical exam. Your health care provider may feel the lumpy tumors during a pelvic exam. Ultrasonography may be done to get information regarding size, location, and number of tumors.  TREATMENT   Your health care provider may recommend watchful waiting. This involves getting the fibroid checked by your health care provider to see if it grows or shrinks.   Hormone treatment or an intrauterine device (IUD) may be prescribed.   Surgery may be needed to remove the fibroids (myomectomy) or the uterus (hysterectomy). This depends on your situation. When fibroids interfere with fertility and a woman wants to become pregnant, a health care provider may recommend having the fibroids removed.  [place] care depends on how you were treated. In general:   Keep all follow-up appointments with your health care provider.   Only take over-the-counter or prescription medicines as directed by your health care provider. If you were prescribed a hormone treatment, take the hormone medicines exactly as directed. Do not take aspirin. It can cause bleeding.   Talk to your health care provider about taking iron pills.  If your periods are troublesome but not so heavy, lie down with your feet raised slightly above your heart. Place cold packs on your lower abdomen.   If your periods are heavy, write down the number of pads or tampons you  use per month. Bring this information to your health care provider.   Include green vegetables in your diet.  SEEK IMMEDIATE MEDICAL CARE IF:  You have pelvic pain or cramps not controlled with medicines.   You have a sudden increase in pelvic pain.   You have an increase in bleeding between and during periods.   You have excessive periods and soak tampons or pads in a half hour or less.  You feel lightheaded or have fainting episodes. Document Released: 03/28/2000 Document Revised: 01/19/2013 Document Reviewed: 10/28/2012 [facility] Patient Information 2015 [place], [place]. This information is not intended to replace advice given to you by your health care provider. Make sure you discuss any questions you have with your health care provider. CA-125 Tumor Marker CA 125 is a tumor marker that is used to help monitor the course of ovarian or endometrial cancer. PREPARATION FOR TEST No preparation is necessary. NORMAL FINDINGS Adults: 0-35 units/mL (0-35 kilounits)/L Ranges for normal findings may vary among different laboratories and hospitals. You should always check with your doctor after having lab work or other tests done to discuss the meaning of your test results and whether your values are considered within normal limits. MEANING OF TEST  Your caregiver will go over the test results with  you and discuss the importance and meaning of your results, as well as treatment options and the need for additional tests if necessary. OBTAINING THE TEST RESULTS It is your responsibility to obtain your test results. Ask the lab or department performing the test when and how you will get your results. Document Released: 04/22/2004 Document Revised: 06/23/2011 Document Reviewed: 03/08/2008 [facility] Patient Information 2015 [place], [place]. This information is not intended to replace advice given to you by your health care provider. Make sure you discuss any questions you have with your health  care provider.

## 2014-08-22 ENCOUNTER — Other Ambulatory Visit: Payer: Self-pay | Admitting: Gynecology

## 2014-08-22 ENCOUNTER — Other Ambulatory Visit: Payer: Self-pay | Admitting: *Deleted

## 2014-08-22 ENCOUNTER — Telehealth: Payer: Self-pay | Admitting: Gynecology

## 2014-08-22 ENCOUNTER — Telehealth: Payer: Self-pay | Admitting: *Deleted

## 2014-08-22 DIAGNOSIS — E119 Type 2 diabetes mellitus without complications: Secondary | ICD-10-CM

## 2014-08-22 LAB — CA 125: CA 125: 101 U/mL — ABNORMAL HIGH (ref <35)

## 2014-08-22 MED ORDER — METFORMIN HCL 500 MG PO TABS: 500.0000 mg | ORAL_TABLET | Freq: Two times a day (BID) | ORAL | Status: DC

## 2014-08-22 NOTE — Telephone Encounter (Signed)
The below is an error

## 2014-08-22 NOTE — Telephone Encounter (Signed)
[address]

## 2014-08-22 NOTE — Telephone Encounter (Signed)
-----   Message from Terrance Mass, MD sent at 08/22/2014 11:46 AM EDT ----- Anderson Malta, please make appointment for this patient with Dr. Leighton Ruff colorectal surgeon for this patient with fecal incontinence and rectal prolapse. Please send her copy of today's office note since they are not on the same electronic medical record system. Thank you

## 2014-08-22 NOTE — Telephone Encounter (Signed)
Patient with postmenopausal bleeding for several months has been on Megace up to 3 times per day. Patient previously had been followed by another provider at [facility] hospital. We had done an endometrial biopsy last week with benign endometrium and endometrial polyp identified. Ultrasound sonohysterogram yesterday had demonstrated the following:  Uterus measured 8 point, 12.8 x 9.6 cm with endometrial stripe of 43.8 mm. Patient had 5 intramural fibroids the largest one measuring 6.2 x 4.8 cm. Thick cystic solid endometrium with positive color flow right ovary thick wall cyst measuring 16 x 10 mm negative color flow left ovary solid focus 23 x 16 mm negative color flow arterial blood flow was seen on both ovaries. Afterwards the cervix was cleansed with Betadine solution and sterile catheter was introduced into the uterine cavity normal saline was instilled several intrauterine defects measuring 5.8 x 3.5 cm, 2.6 x 1.7 cm, 2.6 x 2.5 cm and 2.7 x 1.7 cm was noted.  Her CBC came back normal but her CA 125 came back with a value of 101.   Patient originally was going to be scheduled for abdominal hysterectomy with bilateral salpingo-oophorectomy but with the finding of elevated CA 125 and suspicious ovarian cysts and intrauterine findings we are going to refer her to the GYN oncologist for further evaluation. Patient to be notified.

## 2014-08-22 NOTE — Telephone Encounter (Signed)
Appointment on 08/28/14 @ 10:00am with Dr.Clarke-Pearson at [facility] cancer center. Pt informed with the below

## 2014-08-22 NOTE — Telephone Encounter (Signed)
-----   Message from Ramond Craver, [place] sent at 08/22/2014 10:50 AM EDT ----- Regarding: referral to Gyn oncology CA125 very elevated.  Dr. Moshe Salisbury said he will speak with her sometime today.  Wants you to schedule her at GYN Oncology.  Thx

## 2014-08-23 ENCOUNTER — Ambulatory Visit
Admission: RE | Admit: 2014-08-23 | Discharge: 2014-08-23 | Disposition: A | Discharge status: Home / Self Care | Payer: Medicare Other | Source: Ambulatory Visit

## 2014-08-23 ENCOUNTER — Ambulatory Visit: Payer: Medicare Other

## 2014-08-23 DIAGNOSIS — Z1231 Encounter for screening mammogram for malignant neoplasm of breast: Secondary | ICD-10-CM

## 2014-08-28 ENCOUNTER — Encounter: Payer: Self-pay | Admitting: Gynecology

## 2014-08-28 ENCOUNTER — Ambulatory Visit: Payer: Medicare Other | Attending: Gynecology | Admitting: Gynecology

## 2014-08-28 VITALS — BP 111/63 | HR 90 | Temp 98.4°F | Resp 18 | Ht 64.0 in | Wt 192.3 lb

## 2014-08-28 DIAGNOSIS — R971 Elevated cancer antigen 125 [CA 125]: Secondary | ICD-10-CM | POA: Diagnosis not present

## 2014-08-28 DIAGNOSIS — N950 Postmenopausal bleeding: Secondary | ICD-10-CM | POA: Diagnosis not present

## 2014-08-28 DIAGNOSIS — D251 Intramural leiomyoma of uterus: Secondary | ICD-10-CM | POA: Insufficient documentation

## 2014-08-28 NOTE — Patient Instructions (Signed)
Please contact Dr. Sandrea Hughs office to arrange to have a d and c.  Dilation and Curettage or Vacuum Curettage Dilation and curettage (D&C) and vacuum curettage are minor procedures. A D&C involves stretching (dilation) the cervix and scraping (curettage) the inside lining of the womb (uterus). During a D&C, tissue is gently scraped from the inside lining of the uterus. During a vacuum curettage, the lining and tissue in the uterus are removed with the use of gentle suction.  Curettage may be performed to either diagnose or treat a problem. As a diagnostic procedure, curettage is performed to examine tissues from the uterus. A diagnostic curettage may be performed for the following symptoms:   Irregular bleeding in the uterus.   Bleeding with the development of clots.   Spotting between menstrual periods.   Prolonged menstrual periods.   Bleeding after menopause.   No menstrual period (amenorrhea).   A change in size and shape of the uterus.  As a treatment procedure, curettage may be performed for the following reasons:   Removal of an IUD (intrauterine device).   Removal of retained placenta after giving birth. Retained placenta can cause an infection or bleeding severe enough to require transfusions.   Abortion.   Miscarriage.   Removal of polyps inside the uterus.   Removal of uncommon types of noncancerous lumps (fibroids).  LET [facility] CARE PROVIDER KNOW ABOUT:   Any allergies you have.   All medicines you are taking, including vitamins, herbs, eye drops, creams, and over-the-counter medicines.   Previous problems you or members of your family have had with the use of anesthetics.   Any blood disorders you have.   Previous surgeries you have had.   Medical conditions you have. RISKS AND COMPLICATIONS  Generally, this is a safe procedure. However, as with any procedure, complications can occur. Possible complications include:  Excessive  bleeding.   Infection of the uterus.   Damage to the cervix.   Development of scar tissue (adhesions) inside the uterus, later causing abnormal amounts of menstrual bleeding.   Complications from the general anesthetic, if a general anesthetic is used.   Putting a hole (perforation) in the uterus. This is rare.  BEFORE THE PROCEDURE   Eat and drink before the procedure only as directed by your health care provider.   Arrange for someone to take you home.  PROCEDURE  This procedure usually takes about 15-30 minutes.  You will be given one of the following:  A medicine that numbs the area in and around the cervix (local anesthetic).   A medicine to make you sleep through the procedure (general anesthetic).  You will lie on your back with your legs in stirrups.   A warm metal or plastic instrument (speculum) will be placed in your vagina to keep it open and to allow the health care provider to see the cervix.  There are two ways in which your cervix can be softened and dilated. These include:   Taking a medicine.   Having thin rods (laminaria) inserted into your cervix.   A curved tool (curette) will be used to scrape cells from the inside lining of the uterus. In some cases, gentle suction is applied with the curette. The curette will then be removed.  AFTER THE PROCEDURE   You will rest in the recovery area until you are stable and are ready to go home.   You may feel sick to your stomach (nauseous) or throw up (vomit) if you were given  a general anesthetic.   You may have a sore throat if a tube was placed in your throat during general anesthesia.   You may have light cramping and bleeding. This may last for 2 days to 2 weeks after the procedure.   Your uterus needs to make a new lining after the procedure. This may make your next period late. Document Released: 03/31/2005 Document Revised: 12/01/2012 Document Reviewed: 10/28/2012 [facility] Patient  Information 2015 [place], [place]. This information is not intended to replace advice given to you by your health care provider. Make sure you discuss any questions you have with your health care provider.

## 2014-08-28 NOTE — Progress Notes (Signed)
Consult Note: Gyn-Onc   Grace [name] 59 y.o. female  No chief complaint on file.   Assessment :postmenopausal bleeding(with cystic and solid endometrial mass), uterine fibroids, endometrial polyp, elevated CA-125.  Plan: given the extent of abnormality in the endometrial cavity(despite an endometrial biopsy showing no significant pathology), I would recommend that the patient undergo a D&C. This is been discussed with Dr. Toney Rakes and he will arrange for the procedure in the near future. Based on the [facility] findings further recommendations will follow.these recommendations were discussed with the patient and all of her questions are answered.   HPI:  59 year old white single female seen in consultation the request of Dr. Uvaldo Rising regarding management of postmenopausal bleeding, fibroids, endometrial polyp, ovarian cyst, and elevated CA-125.  Patient reports she never really went through menopause and has had irregular bleeding over the past decade. She's been evaluated by other physicians. Prior gynecologic procedures include abdominal myomectomies on 2 occasions and endometrial ablation. The patient is not quite certain as to the exact dates of these procedures although the myomectomies were approximated 20 years ago.  The patient recently transferred her care to Dr. Uvaldo Rising who is been undertaking a detailed workup.    This has included a pelvic ultrasound on 08/21/2014 showed multiple uterine fibroids and a right ovary with a thick wall cyst measuring 16 x 10 mm negative color flow left ovary solid focus 23 x 16 mm negative color flow arterial blood flow was seen on both ovaries.remarkably, the endometrial stripe was or 0.38 cm which was cystic and solid with positive color flow. An endometrial biopsy showed progestational effect an endometrial polyp. (The patient has been on Megace 3 times a day). CA-125 was 101 units per mL.  Obstetrical history: Gravida 0  Family history  is negative for gynecologic breast or ovarian cancer. Review of Systems:10 point review of systems is negative except as noted in interval history.   Vitals: Last menstrual period 02/27/2014.  Physical Exam: General : The patient is a healthy woman in no acute distress.  HEENT: normocephalic, extraoccular movements normal; neck is supple without thyromegally  Lynphnodes: Supraclavicular and inguinal nodes not enlarged  Abdomen: Soft, non-tender, no ascites, no organomegally, no masses, no hernias  Pelvic:  EGBUS: Normal female  Vagina: Normal, no lesions , there is blood present. Urethra and Bladder: Normal, non-tender  Cervix: normal with blood coming from the cervical os  Uterus: irregular and approximately [redacted] weeks gestational size. Nontender. Bi-manual examination: Non-tender;pelvic mass as noted above most consistent with a fibroid uterus.  Rectal: normal sphincter tone, no masses, no blood  Lower extremities: No edema or varicosities. Normal range of motion      Allergies  Allergen Reactions  . Codeine     REACTION: headaches    Past Medical History  Diagnosis Date  . GERD (gastroesophageal reflux disease)   . Hyperlipidemia   . Hypertension   . Migraine   . COPD (chronic obstructive pulmonary disease)   . Hypothyroidism     with hx of multinodular goiter (noted on neck US in 11/2002 - showing diffuse nodularity and inhomogenous texture diffusely BL)  . Asthma   . Hiatal hernia   . Barrett's esophagus     with high grade dysplasia per endoscopy 05/2005 // followed by Dr. [name] ([facility])  . History of CVA (cerebrovascular accident) 10/2000  . Endometrial polyp 03/2003    s/p resection in 03/2003. Path showing submucosal leiomyoma and benign  proliferative type endometrium  .  Duodenal ulcer disease     thought to be contributed at least partly by overuse of NSAIDs and salicylates (goody powdr)  . Internal hemorrhoids     grade 2 per colonoscopy in 09/2006 -  repeat colonoscopy rec in 5-10 years.  . 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 Fibroid uterus     s/p myomectomy x 2  . Tachycardia   . Nevus   . H/O failed moderate sedation   . Personal history of failed conscious sedation 08/11/2011  . Thyroid disease   . Chronic back pain   . Abnormal vaginal bleeding   . Tobacco abuse Jan 2014    quit    Past Surgical History  Procedure Laterality Date  . Cholecystectomy    . Myomectomy  1990, 1997    x 2 - In 1997, noted to have extensive pelvic adhesions and BL tubal obstruction  . Colonoscopy  02/07/2010  . Upper gastrointestinal endoscopy  12/18/2009  . Hysteroscopy      Current Outpatient Prescriptions  Medication Sig Dispense Refill  . albuterol (PROAIR HFA) 108 (90 BASE) MCG/ACT inhaler Inhale 2 puffs into the lungs every 6 (six) hours as needed for wheezing or shortness of breath. 1 Inhaler 6  . Aspirin-Acetaminophen-Caffeine 539-767-34 MG PACK Take by mouth as needed.    . Biotin 1000 MCG tablet Take 1,000 mcg by mouth 3 (three) times daily.    . cyclobenzaprine (FLEXERIL) 10 MG tablet Take 10 mg by mouth as needed for muscle spasms.    . diphenhydrAMINE (BENADRYL) 25 MG tablet Take 25 mg by mouth as needed.    . fluticasone (FLOVENT HFA) 44 MCG/ACT inhaler Inhale 2 puffs into the lungs two times daily 10.6 g 2  . glucose blood (ONETOUCH VERIO) test strip Use to self monitor blood glucose twice daily and as directed (E11.9) 100 each 6  . ibuprofen (ADVIL,MOTRIN) 200 MG tablet Take 200 mg by mouth as needed.    Marland Kitchen levothyroxine (SYNTHROID, LEVOTHROID) 125 MCG tablet Take 1 tablet (125 mcg total) by mouth daily before breakfast. 30 tablet 2  . lisinopril-hydrochlorothiazide (PRINZIDE,ZESTORETIC) 10-12.5 MG per tablet Take 1 tablet by mouth daily. 30 tablet 6  . megestrol (MEGACE) 40 MG tablet Take 1 tablet (40 mg total) by mouth 3 (three) times  daily as needed (heavy vaginal bleeding). 60 tablet 5  . Melatonin 10 MG CAPS Take by mouth at bedtime.    . metFORMIN (GLUCOPHAGE) 500 MG tablet Take 1 tablet (500 mg total) by mouth 2 (two) times daily with a meal. 180 tablet 3  . metoprolol tartrate (LOPRESSOR) 25 MG tablet Take 1 tablet (25 mg total) by mouth 2 (two) times daily. 60 tablet 3  . ONETOUCH DELICA LANCETS 19F MISC 1 each by Does not apply route 2 (two) times daily. 100 each 6  . ranitidine (ZANTAC) 150 MG tablet Take 150 mg by mouth 2 (two) times daily.    . rosuvastatin (CRESTOR) 40 MG tablet Take 1 tablet (40 mg total) by mouth daily. 30 tablet 4  . VALERIAN ROOT PO Take by mouth at bedtime as needed.    . [DISCONTINUED] ipratropium (ATROVENT HFA) 17 MCG/ACT inhaler Inhale 2 puffs into the lungs 4 (four) times daily. 1 Inhaler 6   No current facility-administered medications for this visit.    History   Social History  .  Marital Status: Divorced    Spouse Name: N/A  . Number of Children: N/A  . Years of Education: N/A   Occupational History  . Not on file.   Social History Main Topics  . Smoking status: Current Every Day Smoker -- 0.25 packs/day for 42 years    Types: Cigarettes    Last Attempt to Quit: 05/09/2012  . Smokeless tobacco: Never Used     Comment: Counseling sheet [id number]   . Alcohol Use: No  . Drug Use: No  . Sexual Activity: No   Other Topics Concern  . Not on file   Social History Narrative         Financial assistance approved for 100% discount at [facility] and has [facility] card per Bonna Gains   01/15/2010      Diet- N/A   Caffeine- Goody powders   Married-Divorced   [employer] and lives with mother   Pets-2   Current/past profession-[employer]   Exercise-No   Living will-No   DNR-No   POA/HPOA-No             Family History  Problem Relation Age of Onset  . COPD Mother   . Heart disease Father     had 5 heart attacks, first at age 54s.  Marland Kitchen COPD Father       Alvino Chapel, MD 08/28/2014, 10:11 AM

## 2014-08-30 ENCOUNTER — Encounter: Payer: Self-pay | Admitting: Gynecology

## 2014-09-01 ENCOUNTER — Telehealth: Payer: Self-pay

## 2014-09-01 NOTE — Telephone Encounter (Signed)
I sent you a previous request for resectoscopic polypectomy/myosre for this patient. I will need a preop consult as well. Check your notes

## 2014-09-01 NOTE — Telephone Encounter (Signed)
Patient said that Dr. Fermin Schwab wanted her to schedule a D&C with you.

## 2014-09-05 ENCOUNTER — Encounter: Payer: Self-pay | Admitting: Gynecology

## 2014-09-14 ENCOUNTER — Telehealth: Payer: Self-pay

## 2014-09-14 NOTE — Telephone Encounter (Signed)
Patient called stating that she needed to cancel her D&C, Hysteroscopy scheduled 10/02/14.  The lady who was going to drive her has fallen and broken her hip.  Patient asked me just to cancel for now because she does not know when this lady will be healed enough to drive. Currently still in hospital.

## 2014-09-14 NOTE — Telephone Encounter (Signed)
Tell her to let us know when she wants to reschedule

## 2014-09-18 ENCOUNTER — Ambulatory Visit (INDEPENDENT_AMBULATORY_CARE_PROVIDER_SITE_OTHER): Payer: Medicare Other | Admitting: Pharmacotherapy

## 2014-09-18 ENCOUNTER — Encounter: Payer: Self-pay | Admitting: Internal Medicine

## 2014-09-18 ENCOUNTER — Encounter: Payer: Self-pay | Admitting: Pharmacotherapy

## 2014-09-18 VITALS — BP 138/78 | HR 86 | Temp 97.9°F | Resp 20 | Ht 64.0 in | Wt 186.6 lb

## 2014-09-18 DIAGNOSIS — E119 Type 2 diabetes mellitus without complications: Secondary | ICD-10-CM | POA: Diagnosis not present

## 2014-09-18 DIAGNOSIS — I10 Essential (primary) hypertension: Secondary | ICD-10-CM

## 2014-09-18 NOTE — Patient Instructions (Signed)
Stay motivated. Try to take metformin 57m twice daily

## 2014-09-18 NOTE — Progress Notes (Signed)
  Subjective:    Grace [name] is a 59 y.o.white female who presents for follow-up of Type 2 diabetes mellitus.   Last A1C was 7.6% She has been SMBG twice daily. Average BG:  138mg /dl No hypoglycemia  Drinking plenty of water. Making healthy food choices.  She has been having prolonged periods of vaginal bleeding.  CA-125 was elevated.  She has been referred to GYN oncologist.  Exercise limited due to back pain. Joined [employer] - but has not been able to go yet due to prolonged bleeding. She is doing some chair exercise.  Denies problems with feet. Denies problems with vision Nocturia once per night. Denies peripheral edema  She had diarrhea with increased metformin.  She is now taking 250mg  AM, 500mg  PM.   Review of Systems A comprehensive review of systems was negative except for: Gastrointestinal: positive for change in bowel habits and diarrhea Genitourinary: positive for nocturia    Objective:    BP 138/78 mmHg  Pulse 86  Temp(Src) 97.9 F (36.6 C) (Oral)  Resp 20  Ht 5\' 4"  (1.626 m)  Wt 186 lb 9.6 oz (84.641 kg)  BMI 32.01 kg/m2  SpO2 95%  LMP 02/27/2014  General:  alert, cooperative and no distress  Oropharynx: normal findings: lips normal without lesions and gums healthy   Eyes:  negative findings: lids and lashes normal and conjunctivae and sclerae normal   Ears:  external ears normal        Lung: clear to auscultation bilaterally  Heart:  regular rate and rhythm     Extremities: extremities normal, atraumatic, no cyanosis or edema  Skin: warm and dry, no hyperpigmentation, vitiligo, or suspicious lesions     Neuro: mental status, speech normal, alert and oriented x3 and gait and station normal   Lab Review GLUCOSE (mg/dL)  Date Value  06/30/2014 147*   GLUCOSE, BLD (mg/dL)  Date Value  03/15/2013 102*  12/25/2011 97  11/13/2011 96   CO2  Date Value  06/30/2014 24 mmol/L  03/15/2013 32 mEq/L  12/25/2011 28 mEq/L   BUN (mg/dL)  Date  Value  06/30/2014 20  03/15/2013 16  12/25/2011 16  11/13/2011 17   CREAT (mg/dL)  Date Value  03/15/2013 0.87  12/25/2011 0.97  11/13/2011 1.15*   CREATININE, SER (mg/dL)  Date Value  06/30/2014 0.89  06/26/2009 0.82  01/10/2009 0.99       Assessment:    Diabetes Mellitus type II, under good control. Average BG good. BP at goal <140/90   Plan:    1.  Rx changes: increase metformin 500mg  BID. 2.  Reviewed nutrition goals. 3.  Praised exercise efforts.  Goal is 30-45 minutes 5 x week. 4.  BP at goal <140/90 5.  RTC in 8 weeks - lab prior:  A1C, CMP.

## 2014-09-25 ENCOUNTER — Encounter: Payer: Medicare Other | Admitting: Gynecology

## 2014-10-02 ENCOUNTER — Encounter (HOSPITAL_COMMUNITY): Admission: RE | Payer: Self-pay | Source: Ambulatory Visit

## 2014-10-02 ENCOUNTER — Ambulatory Visit (HOSPITAL_COMMUNITY): Admission: RE | Admit: 2014-10-02 | Payer: Medicare Other | Source: Ambulatory Visit | Admitting: Gynecology

## 2014-10-02 SURGERY — DILATATION & CURETTAGE/HYSTEROSCOPY WITH MYOSURE
Anesthesia: General

## 2014-10-11 ENCOUNTER — Other Ambulatory Visit: Payer: Self-pay | Admitting: *Deleted

## 2014-10-11 NOTE — Telephone Encounter (Signed)
error 

## 2014-10-12 ENCOUNTER — Other Ambulatory Visit: Payer: Self-pay | Admitting: Internal Medicine

## 2014-10-24 ENCOUNTER — Telehealth: Payer: Self-pay | Admitting: *Deleted

## 2014-10-24 NOTE — Telephone Encounter (Signed)
Noted. D/c metformin due to GI upset. Rx glipizide er 5mg  #30 take 1 po daily with 4 RF; f/u as scheduled

## 2014-10-24 NOTE — Telephone Encounter (Signed)
Patient called and stated that she cannot take the Metformin. It is causing Nausea and Vomiting and Diarrhea. Her blood sugars have been running on the high side of 200. Last night it was 219 but this morning it was 129. Her mother is in [place] and patient very stressed. Her mother has taken a downturn and not coming home anytime soon. Patient stated that she is not sleeping good at night either. Please Advise.    Cell#: [phone]

## 2014-10-25 MED ORDER — GLIPIZIDE ER 5 MG PO TB24: ORAL_TABLET | ORAL | Status: DC

## 2014-10-25 NOTE — Telephone Encounter (Signed)
Patient Notified and agreed. Rx faxed to pharmacy and appointment scheduled for Friday due to issues with Blood pressure.

## 2014-10-27 ENCOUNTER — Ambulatory Visit (INDEPENDENT_AMBULATORY_CARE_PROVIDER_SITE_OTHER): Payer: Medicare Other | Admitting: Internal Medicine

## 2014-10-27 ENCOUNTER — Encounter (HOSPITAL_COMMUNITY): Payer: Self-pay | Admitting: Emergency Medicine

## 2014-10-27 ENCOUNTER — Encounter: Payer: Self-pay | Admitting: Internal Medicine

## 2014-10-27 ENCOUNTER — Emergency Department (HOSPITAL_COMMUNITY)
Admission: EM | Admit: 2014-10-27 | Discharge: 2014-10-27 | Disposition: A | Discharge status: Home / Self Care | Payer: Medicare Other | Attending: Emergency Medicine | Admitting: Emergency Medicine

## 2014-10-27 VITALS — BP 76/46 | HR 85 | Temp 97.7°F | Resp 20 | Ht 64.0 in | Wt 173.0 lb

## 2014-10-27 DIAGNOSIS — Z79899 Other long term (current) drug therapy: Secondary | ICD-10-CM | POA: Insufficient documentation

## 2014-10-27 DIAGNOSIS — Z7982 Long term (current) use of aspirin: Secondary | ICD-10-CM | POA: Insufficient documentation

## 2014-10-27 DIAGNOSIS — E860 Dehydration: Secondary | ICD-10-CM

## 2014-10-27 DIAGNOSIS — Z9283 Personal history of failed moderate sedation: Secondary | ICD-10-CM | POA: Diagnosis not present

## 2014-10-27 DIAGNOSIS — R031 Nonspecific low blood-pressure reading: Secondary | ICD-10-CM | POA: Diagnosis not present

## 2014-10-27 DIAGNOSIS — Z8673 Personal history of transient ischemic attack (TIA), and cerebral infarction without residual deficits: Secondary | ICD-10-CM | POA: Diagnosis not present

## 2014-10-27 DIAGNOSIS — I9589 Other hypotension: Secondary | ICD-10-CM

## 2014-10-27 DIAGNOSIS — G8929 Other chronic pain: Secondary | ICD-10-CM | POA: Diagnosis not present

## 2014-10-27 DIAGNOSIS — R112 Nausea with vomiting, unspecified: Secondary | ICD-10-CM | POA: Insufficient documentation

## 2014-10-27 DIAGNOSIS — E039 Hypothyroidism, unspecified: Secondary | ICD-10-CM | POA: Insufficient documentation

## 2014-10-27 DIAGNOSIS — E785 Hyperlipidemia, unspecified: Secondary | ICD-10-CM | POA: Diagnosis not present

## 2014-10-27 DIAGNOSIS — I10 Essential (primary) hypertension: Secondary | ICD-10-CM | POA: Diagnosis not present

## 2014-10-27 DIAGNOSIS — Z86018 Personal history of other benign neoplasm: Secondary | ICD-10-CM | POA: Insufficient documentation

## 2014-10-27 DIAGNOSIS — G43909 Migraine, unspecified, not intractable, without status migrainosus: Secondary | ICD-10-CM | POA: Diagnosis not present

## 2014-10-27 DIAGNOSIS — R42 Dizziness and giddiness: Secondary | ICD-10-CM

## 2014-10-27 DIAGNOSIS — J449 Chronic obstructive pulmonary disease, unspecified: Secondary | ICD-10-CM | POA: Diagnosis not present

## 2014-10-27 DIAGNOSIS — N939 Abnormal uterine and vaginal bleeding, unspecified: Secondary | ICD-10-CM | POA: Diagnosis not present

## 2014-10-27 DIAGNOSIS — R5383 Other fatigue: Secondary | ICD-10-CM | POA: Diagnosis not present

## 2014-10-27 DIAGNOSIS — Z7951 Long term (current) use of inhaled steroids: Secondary | ICD-10-CM | POA: Insufficient documentation

## 2014-10-27 DIAGNOSIS — E119 Type 2 diabetes mellitus without complications: Secondary | ICD-10-CM | POA: Diagnosis not present

## 2014-10-27 DIAGNOSIS — Z720 Tobacco use: Secondary | ICD-10-CM | POA: Diagnosis not present

## 2014-10-27 DIAGNOSIS — I959 Hypotension, unspecified: Secondary | ICD-10-CM | POA: Diagnosis not present

## 2014-10-27 LAB — CBC WITH DIFFERENTIAL/PLATELET
Basophils Absolute: 0 10*3/uL (ref 0.0–0.1)
Basophils Relative: 0 % (ref 0–1)
Eosinophils Absolute: 0.3 10*3/uL (ref 0.0–0.7)
Eosinophils Relative: 3 % (ref 0–5)
HCT: 38.9 % (ref 36.0–46.0)
HEMOGLOBIN: 12.5 g/dL (ref 12.0–15.0)
LYMPHS PCT: 26 % (ref 12–46)
Lymphs Abs: 2.4 10*3/uL (ref 0.7–4.0)
MCH: 30.4 pg (ref 26.0–34.0)
MCHC: 32.1 g/dL (ref 30.0–36.0)
MCV: 94.6 fL (ref 78.0–100.0)
MONO ABS: 0.9 10*3/uL (ref 0.1–1.0)
Monocytes Relative: 10 % (ref 3–12)
NEUTROS ABS: 5.8 10*3/uL (ref 1.7–7.7)
Neutrophils Relative %: 61 % (ref 43–77)
Platelets: 221 10*3/uL (ref 150–400)
RBC: 4.11 MIL/uL (ref 3.87–5.11)
RDW: 14.3 % (ref 11.5–15.5)
WBC: 9.4 10*3/uL (ref 4.0–10.5)

## 2014-10-27 LAB — URINE MICROSCOPIC-ADD ON

## 2014-10-27 LAB — COMPREHENSIVE METABOLIC PANEL
ALT: 22 U/L (ref 14–54)
AST: 24 U/L (ref 15–41)
Albumin: 4.1 g/dL (ref 3.5–5.0)
Alkaline Phosphatase: 54 U/L (ref 38–126)
Anion gap: 14 (ref 5–15)
BUN: 35 mg/dL — AB (ref 6–20)
CO2: 29 mmol/L (ref 22–32)
Calcium: 9.7 mg/dL (ref 8.9–10.3)
Chloride: 92 mmol/L — ABNORMAL LOW (ref 101–111)
Creatinine, Ser: 1.51 mg/dL — ABNORMAL HIGH (ref 0.44–1.00)
GFR calc non Af Amer: 37 mL/min — ABNORMAL LOW (ref >60)
GFR, EST AFRICAN AMERICAN: 43 mL/min — AB (ref >60)
GLUCOSE: 85 mg/dL (ref 65–99)
Potassium: 3.1 mmol/L — ABNORMAL LOW (ref 3.5–5.1)
SODIUM: 135 mmol/L (ref 135–145)
TOTAL PROTEIN: 6.9 g/dL (ref 6.5–8.1)
Total Bilirubin: 0.4 mg/dL (ref 0.3–1.2)

## 2014-10-27 LAB — URINALYSIS, ROUTINE W REFLEX MICROSCOPIC
Bilirubin Urine: NEGATIVE
GLUCOSE, UA: NEGATIVE mg/dL
KETONES UR: 15 mg/dL — AB
Leukocytes, UA: NEGATIVE
Nitrite: NEGATIVE
PH: 7 (ref 5.0–8.0)
PROTEIN: NEGATIVE mg/dL
SPECIFIC GRAVITY, URINE: 1.012 (ref 1.005–1.030)
Urobilinogen, UA: 0.2 mg/dL (ref 0.0–1.0)

## 2014-10-27 LAB — LIPASE, BLOOD: Lipase: 29 U/L (ref 22–51)

## 2014-10-27 LAB — PROTIME-INR
INR: 0.99 (ref 0.00–1.49)
PROTHROMBIN TIME: 13.3 s (ref 11.6–15.2)

## 2014-10-27 LAB — TSH: TSH: 4.354 u[IU]/mL (ref 0.350–4.500)

## 2014-10-27 MED ORDER — POTASSIUM CHLORIDE CRYS ER 20 MEQ PO TBCR
40.0000 meq | EXTENDED_RELEASE_TABLET | Freq: Once | ORAL | Status: AC
  Administered 2014-10-27: 40 meq via ORAL
  Filled 2014-10-27: qty 2

## 2014-10-27 MED ORDER — SODIUM CHLORIDE 0.9 % IV BOLUS (SEPSIS)
1000.0000 mL | Freq: Once | INTRAVENOUS | Status: AC
  Administered 2014-10-27: 1000 mL via INTRAVENOUS

## 2014-10-27 NOTE — Patient Instructions (Signed)
Send to ED for further workup.  Follow up after discharge

## 2014-10-27 NOTE — Discharge Instructions (Signed)
Dehydration, Adult  Dehydration is when you lose more fluids from the body than you take in. Vital organs like the kidneys, brain, and heart cannot function without a proper amount of fluids and salt. Any loss of fluids from the body can cause dehydration.   CAUSES    Vomiting.   Diarrhea.   Excessive sweating.   Excessive urine output.   Fever.  SYMPTOMS   Mild dehydration   Thirst.   Dry lips.   Slightly dry mouth.  Moderate dehydration   Very dry mouth.   Sunken eyes.   Skin does not bounce back quickly when lightly pinched and released.   Dark urine and decreased urine production.   Decreased tear production.   Headache.  Severe dehydration   Very dry mouth.   Extreme thirst.   Rapid, weak pulse (more than 100 beats per minute at rest).   Cold hands and feet.   Not able to sweat in spite of heat and temperature.   Rapid breathing.   Blue lips.   Confusion and lethargy.   Difficulty being awakened.   Minimal urine production.   No tears.  DIAGNOSIS   Your caregiver will diagnose dehydration based on your symptoms and your exam. Blood and urine tests will help confirm the diagnosis. The diagnostic evaluation should also identify the cause of dehydration.  TREATMENT   Treatment of mild or moderate dehydration can often be done at home by increasing the amount of fluids that you drink. It is best to drink small amounts of fluid more often. Drinking too much at one time can make vomiting worse. Refer to the home care instructions below.  Severe dehydration needs to be treated at the hospital where you will probably be given intravenous (IV) fluids that contain water and electrolytes.  HOME CARE INSTRUCTIONS    Ask your caregiver about specific rehydration instructions.   Drink enough fluids to keep your urine clear or pale yellow.   Drink small amounts frequently if you have nausea and vomiting.   Eat as you normally do.   Avoid:   Foods or drinks high in sugar.   Carbonated  drinks.   Juice.   Extremely hot or cold fluids.   Drinks with caffeine.   Fatty, greasy foods.   Alcohol.   Tobacco.   Overeating.   Gelatin desserts.   Wash your hands well to avoid spreading bacteria and viruses.   Only take over-the-counter or prescription medicines for pain, discomfort, or fever as directed by your caregiver.   Ask your caregiver if you should continue all prescribed and over-the-counter medicines.   Keep all follow-up appointments with your caregiver.  SEEK MEDICAL CARE IF:   You have abdominal pain and it increases or stays in one area (localizes).   You have a rash, stiff neck, or severe headache.   You are irritable, sleepy, or difficult to awaken.   You are weak, dizzy, or extremely thirsty.  SEEK IMMEDIATE MEDICAL CARE IF:    You are unable to keep fluids down or you get worse despite treatment.   You have frequent episodes of vomiting or diarrhea.   You have blood or green matter (bile) in your vomit.   You have blood in your stool or your stool looks black and tarry.   You have not urinated in 6 to 8 hours, or you have only urinated a small amount of very dark urine.   You have a fever.   You faint.  MAKE   SURE YOU:    Understand these instructions.   Will watch your condition.   Will get help right away if you are not doing well or get worse.  Document Released: 03/31/2005 Document Revised: 06/23/2011 Document Reviewed: 11/18/2010  Exit[employer] Patient Information 2015 [employer]. This information is not intended to replace advice given to you by your health care provider. Make sure you discuss any questions you have with your health care provider.  Hypotension  As your heart beats, it forces blood through your arteries. This force is your blood pressure. If your blood pressure is too low for you to go about your normal activities or to support the organs of your body, you have hypotension. Hypotension is also referred to as low blood pressure. When your blood  pressure becomes too low, you may not get enough blood to your brain. As a result, you may feel weak, feel lightheaded, or develop a rapid heart rate. In a more severe case, you may faint.  CAUSES  Various conditions can cause hypotension. These include:   Blood loss.   Dehydration.   Heart or endocrine problems.   Pregnancy.   Severe infection.   Not having a well-balanced diet filled with needed nutrients.   Severe allergic reactions (anaphylaxis).  Some medicines, such as blood pressure medicine or water pills (diuretics), may lower your blood pressure below normal. Sometimes taking too much medicine or taking medicine not as directed can cause hypotension.  TREATMENT   Hospitalization is sometimes required for hypotension if fluid or blood replacement is needed, if time is needed for medicines to wear off, or if further monitoring is needed. Treatment might include changing your diet, changing your medicines (including medicines aimed at raising your blood pressure), and use of support stockings.  HOME CARE INSTRUCTIONS    Drink enough fluids to keep your urine clear or pale yellow.   Take your medicines as directed by your health care provider.   Get up slowly from reclining or sitting positions. This gives your blood pressure a chance to adjust.   Wear support stockings as directed by your health care provider.   Maintain a healthy diet by including nutritious food, such as fruits, vegetables, nuts, whole grains, and lean meats.  SEEK MEDICAL CARE IF:   You have vomiting or diarrhea.   You have a fever for more than 2-3 days.   You feel more thirsty than usual.   You feel weak and tired.  SEEK IMMEDIATE MEDICAL CARE IF:    You have chest pain or a fast or irregular heartbeat.   You have a loss of feeling in some part of your body, or you lose movement in your arms or legs.   You have trouble speaking.   You become sweaty or feel lightheaded.   You faint.  MAKE SURE YOU:    Understand  these instructions.   Will watch your condition.   Will get help right away if you are not doing well or get worse.  Document Released: 03/31/2005 Document Revised: 01/19/2013 Document Reviewed: 10/01/2012  [employer] Patient Information 2015 [employer]. This information is not intended to replace advice given to you by your health care provider. Make sure you discuss any questions you have with your health care provider.

## 2014-10-27 NOTE — Progress Notes (Signed)
Patient ID: Grace [name], female   DOB: 10/27/1955, 59 y.o.   MRN: [id number]    Location:    [facility]   Place of Service:  OFFICE   Chief Complaint  Patient presents with  . Acute Visit    Blood pressure running low    HPI:  59 yo female seen today for low blood pressure. It was 80/50 when she checked it yesterday. She reports feeling depressed and not eating or sleeping. She has N/V, abdominal pain and diarrhea. She is losing weight, unintentionally. She is worried about her mother who is receiving inpt rehab at [facility]. She takes all meds as ordered, including BP meds. She is taking ibuprofen for back pain which flared up recently. She has occasional dizziness and HA. BS fluctuating. She started glipizide yesterday. She stopped metformin due to GI upset.  Past Medical History  Diagnosis Date  . GERD (gastroesophageal reflux disease)   . Hyperlipidemia   . Hypertension   . Migraine   . COPD (chronic obstructive pulmonary disease)   . Hypothyroidism     with hx of multinodular goiter (noted on neck US in 11/2002 - showing diffuse nodularity and inhomogenous texture diffusely BL)  . Asthma   . Hiatal hernia   . Barrett's esophagus     with high grade dysplasia per endoscopy 05/2005 // followed by Dr. [name] ([facility])  . History of CVA (cerebrovascular accident) 10/2000  . Endometrial polyp 03/2003    s/p resection in 03/2003. Path showing submucosal leiomyoma and benign  proliferative type endometrium  . Duodenal ulcer disease     thought to be contributed at least partly by overuse of NSAIDs and salicylates (goody powdr)  . Internal hemorrhoids     grade 2 per colonoscopy in 09/2006 - repeat colonoscopy rec in 5-10 years.  . 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 Fibroid uterus     s/p myomectomy x 2  . Tachycardia   . Nevus   . H/O failed moderate sedation   . Personal  history of failed conscious sedation 08/11/2011  . Thyroid disease   . Chronic back pain   . Abnormal vaginal bleeding   . Tobacco abuse Jan 2014    quit    Past Surgical History  Procedure Laterality Date  . Cholecystectomy    . Myomectomy  1990, 1997    x 2 - In 1997, noted to have extensive pelvic adhesions and BL tubal obstruction  . Colonoscopy  02/07/2010  . Upper gastrointestinal endoscopy  12/18/2009  . Hysteroscopy      Patient Care Team: Gildardo Cranker, DO as PCP - General (Internal Medicine)  History   Social History  . Marital Status: Divorced    Spouse Name: N/A  . Number of Children: N/A  . Years of Education: N/A   Occupational History  . Not on file.   Social History Main Topics  . Smoking status: Current Every Day Smoker -- 0.25 packs/day for 42 years    Types: Cigarettes    Last Attempt to Quit: 05/09/2012  . Smokeless tobacco: Never Used     Comment: Counseling sheet [id number]   . Alcohol Use: No  . Drug Use: No  . Sexual Activity: No   Other Topics Concern  . Not on file   Social History Narrative  Financial assistance approved for 100% discount at [facility] and has [facility] card per Bonna Gains   01/15/2010      Diet- N/A   Caffeine- Goody powders   Married-Divorced   [employer] and lives with mother   Pets-2   Current/past profession-[employer]   Exercise-No   Living will-No   DNR-No   POA/HPOA-No              reports that she has been smoking Cigarettes.  She has a 10.5 pack-year smoking history. She has never used smokeless tobacco. She reports that she does not drink alcohol or use illicit drugs.  Allergies  Allergen Reactions  . Codeine     REACTION: headaches    Medications: Patient's Medications  New Prescriptions   No medications on file  Previous Medications   ALBUTEROL (PROAIR HFA) 108 (90 BASE) MCG/ACT INHALER    Inhale 2 puffs into the lungs every 6 (six) hours as needed for wheezing or shortness of breath.    ASPIRIN-ACETAMINOPHEN-CAFFEINE 102-725-36 MG PACK    Take by mouth as needed.   BIOTIN 1000 MCG TABLET    Take 1,000 mcg by mouth 3 (three) times daily.   DIPHENHYDRAMINE (BENADRYL) 25 MG TABLET    Take 25 mg by mouth as needed.   FLUTICASONE (FLOVENT HFA) 44 MCG/ACT INHALER    Inhale 2 puffs into the lungs two times daily   GLIPIZIDE (GLUCOTROL XL) 5 MG 24 HR TABLET    Take one tablet by mouth once daily to control blood sugar   GLUCOSE BLOOD (ONETOUCH VERIO) TEST STRIP    Use to self monitor blood glucose twice daily and as directed (E11.9)   IBUPROFEN (ADVIL,MOTRIN) 200 MG TABLET    Take 200 mg by mouth as needed.   LEVOTHYROXINE (SYNTHROID, LEVOTHROID) 125 MCG TABLET    TAKE ONE TABLET BY MOUTH ONCE DAILY BEFORE BREAKFAST   LISINOPRIL-HYDROCHLOROTHIAZIDE (PRINZIDE,ZESTORETIC) 10-12.5 MG PER TABLET    Take 1 tablet by mouth daily.   MEGESTROL (MEGACE) 40 MG TABLET    Take 1 tablet (40 mg total) by mouth 3 (three) times daily as needed (heavy vaginal bleeding).   MELATONIN 10 MG CAPS    Take by mouth at bedtime.   METOPROLOL TARTRATE (LOPRESSOR) 25 MG TABLET    Take 1 tablet (25 mg total) by mouth 2 (two) times daily.   ONETOUCH DELICA LANCETS 64Q MISC    1 each by Does not apply route 2 (two) times daily.   RANITIDINE (ZANTAC) 150 MG TABLET    Take 150 mg by mouth 2 (two) times daily.   ROSUVASTATIN (CRESTOR) 40 MG TABLET    Take 1 tablet (40 mg total) by mouth daily.   VALERIAN ROOT PO    Take by mouth at bedtime as needed.  Modified Medications   No medications on file  Discontinued Medications   METFORMIN (GLUCOPHAGE) 500 MG TABLET    Take 1 tablet (500 mg total) by mouth 2 (two) times daily with a meal.    Review of Systems  Constitutional: Positive for fatigue. Negative for fever, chills, diaphoresis, activity change and appetite change.  HENT: Negative for ear pain and sore throat.   Eyes: Negative for visual disturbance.  Respiratory: Negative for cough, chest tightness and  shortness of breath.   Cardiovascular: Negative for chest pain, palpitations and leg swelling.  Gastrointestinal: Positive for nausea, vomiting, abdominal pain and diarrhea. Negative for constipation and blood in stool.  Genitourinary: Negative for dysuria.  Musculoskeletal: Positive  for arthralgias.  Neurological: Positive for dizziness, weakness and headaches. Negative for tremors and numbness.  Psychiatric/Behavioral: Negative for sleep disturbance. The patient is not nervous/anxious.     Filed Vitals:   10/27/14 1556  BP: 86/56  Repeat by myself 76/46  Pulse: 85  Temp: 97.7 F (36.5 C)  TempSrc: Oral  Resp: 20  Height: 5' 4"  (1.626 m)  Weight: 173 lb (78.472 kg)  SpO2: 91%   Body mass index is 29.68 kg/(m^2).  Physical Exam  Constitutional: She is oriented to person, place, and time. She appears well-developed and well-nourished. She has a sickly appearance. No distress.  HENT:  Mouth/Throat: Oropharynx is clear and moist. Mucous membranes are dry. No oropharyngeal exudate.  Eyes: Pupils are equal, round, and reactive to light. No scleral icterus.  Neck: Neck supple. Carotid bruit is not present. No tracheal deviation present. No thyromegaly present.  Cardiovascular: Normal rate, regular rhythm, normal heart sounds and intact distal pulses.  Exam reveals no gallop and no friction rub.   No murmur heard. No LE edema b/l. no calf TTP. She became dizzy while sitting up from supine position  Pulmonary/Chest: Effort normal and breath sounds normal. No stridor. No respiratory distress. She has no wheezes. She has no rales.  Abdominal: Bowel sounds are normal. She exhibits distension. She exhibits no mass. There is no hepatomegaly. There is no tenderness. There is no rebound and no guarding.  Musculoskeletal: She exhibits edema and tenderness.  Lymphadenopathy:    She has no cervical adenopathy.  Neurological: She is alert and oriented to person, place, and time. She has normal  reflexes.  Skin: Skin is warm, dry and intact. No rash noted.  (+) tenting  Psychiatric: Her behavior is normal. Judgment and thought content normal. Her mood appears anxious.     Labs reviewed: Office Visit on 08/21/2014  Component Date Value Ref Range Status  . WBC 08/21/2014 10.3  4.0 - 10.5 K/uL Final  . RBC 08/21/2014 4.62  3.87 - 5.11 MIL/uL Final  . Hemoglobin 08/21/2014 13.8  12.0 - 15.0 g/dL Final  . HCT 08/21/2014 41.4  36.0 - 46.0 % Final  . MCV 08/21/2014 89.6  78.0 - 100.0 fL Final  . MCH 08/21/2014 29.9  26.0 - 34.0 pg Final  . MCHC 08/21/2014 33.3  30.0 - 36.0 g/dL Final  . RDW 08/21/2014 13.7  11.5 - 15.5 % Final  . Platelets 08/21/2014 283  150 - 400 K/uL Final  . MPV 08/21/2014 11.6  8.6 - 12.4 fL Final  . Neutrophils Relative % 08/21/2014 62  43 - 77 % Final  . Neutro Abs 08/21/2014 6.4  1.7 - 7.7 K/uL Final  . Lymphocytes Relative 08/21/2014 26  12 - 46 % Final  . Lymphs Abs 08/21/2014 2.7  0.7 - 4.0 K/uL Final  . Monocytes Relative 08/21/2014 9  3 - 12 % Final  . Monocytes Absolute 08/21/2014 0.9  0.1 - 1.0 K/uL Final  . Eosinophils Relative 08/21/2014 3  0 - 5 % Final  . Eosinophils Absolute 08/21/2014 0.3  0.0 - 0.7 K/uL Final  . Basophils Relative 08/21/2014 0  0 - 1 % Final  . Basophils Absolute 08/21/2014 0.0  0.0 - 0.1 K/uL Final  . Smear Review 08/21/2014 Criteria for review not met   Final  . CA 125 08/21/2014 101* <35 U/mL Final   Comment:   This test was performed using the Beckman Coulter chemiluminescent method.  Values obtained from different assay methods cannot  be used interchangeably.  CA125 levels , regardless of value, should not be interpreted as absolute evidence of the presence or absence of disease.   Appointment on 08/08/2014  Component Date Value Ref Range Status  . TSH 08/08/2014 1.850  0.450 - 4.500 uIU/mL Final  . Free T4 08/08/2014 1.85* 0.82 - 1.77 ng/dL Final  . Creatinine, Urine 08/08/2014 112.2  15.0 - 278.0 mg/dL Final     Comment: **Effective Aug 21, 2014 the reference interval for**   Creatinine, Urine will be changing to:                                         Not Estab.   . Microalbum.,U,Random 08/08/2014 165.5* 0.0 - 17.0 ug/mL Final   Comment: **Effective Aug 21, 2014 the reference interval for**   Microalbumin, Urine will be changing to:                                         Not Estab.   Marland Kitchen MICROALB/CREAT RATIO 08/08/2014 147.5* 0.0 - 30.0 mg/g creat Final  Office Visit on 07/31/2014  Component Date Value Ref Range Status  . CYTOLOGY - PAP 07/31/2014 PAP RESULT   Final    No results found.   Assessment/Plan   ICD-9-CM ICD-10-CM   1. Other specified hypotension - probably due to medication +/- dehydration 458.8 I95.89   2. Essential hypertension 401.9 I10   3. Type 2 diabetes mellitus without complication 725.36 U44.0   4.      Nausea with vomiting + diarrhea 5.      Dizziness  --sent to the ED by EMS for eval and tx. She will likely need volume rescucitation. She will f/u after d/c  Jimma Ortman S. Perlie Gold  [facility] [address] [place], [place] [address] [phone] Cell (Monday-Friday 8 AM - 5 PM) [phone] After 5 PM and follow prompts

## 2014-10-27 NOTE — ED Notes (Signed)
Per EMS: check bp at home and it was 80/50, has had dizziness past few days.  Went to physicians office, pt sent here.  Has been under a lot of stress, has not been eating and drinking as well as she should.  Not tachycardic, NSR 80.  400 normal saline en route, EMS standing pressure was 70 palpated.  Laying down 110/50 after 400 cc's of normal saline.  COPD, still smoked.

## 2014-10-27 NOTE — ED Notes (Signed)
Pt stable, ambulatory, states understanding of discharge instructions 

## 2014-10-27 NOTE — ED Provider Notes (Signed)
CSN: [id number]     Arrival date & time 10/27/14  1729 History   First MD Initiated Contact with Patient 10/27/14 1740     Chief Complaint  Patient presents with  . Hypotension   (Consider location/radiation/quality/duration/timing/severity/associated sxs/prior Treatment) Patient is a 59 y.o. female presenting with hypertension. The history is provided by the patient. No language interpreter was used.  Hypertension Chronicity: hypotension, 76/46. The current episode started in the past 7 days. The problem occurs intermittently. The problem has been waxing and waning. Associated symptoms include fatigue, nausea and vomiting. Pertinent negatives include no abdominal pain, change in bowel habit, chest pain, chills, congestion, coughing, diaphoresis, fever, headaches, myalgias, numbness, rash, urinary symptoms, vertigo, visual change or weakness. Nothing aggravates the symptoms. She has tried nothing for the symptoms.    Past Medical History  Diagnosis Date  . GERD (gastroesophageal reflux disease)   . Hyperlipidemia   . Hypertension   . Migraine   . COPD (chronic obstructive pulmonary disease)   . Hypothyroidism     with hx of multinodular goiter (noted on neck US in 11/2002 - showing diffuse nodularity and inhomogenous texture diffusely BL)  . Asthma   . Hiatal hernia   . Barrett's esophagus     with high grade dysplasia per endoscopy 05/2005 // followed by Dr. [name] ([facility])  . History of CVA (cerebrovascular accident) 10/2000  . Endometrial polyp 03/2003    s/p resection in 03/2003. Path showing submucosal leiomyoma and benign  proliferative type endometrium  . Duodenal ulcer disease     thought to be contributed at least partly by overuse of NSAIDs and salicylates (goody powdr)  . Internal hemorrhoids     grade 2 per colonoscopy in 09/2006 - repeat colonoscopy rec in 5-10 years.  . 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 Fibroid uterus     s/p myomectomy x 2  . Tachycardia   . Nevus   . H/O failed moderate sedation   . Personal history of failed conscious sedation 08/11/2011  . Thyroid disease   . Chronic back pain   . Abnormal vaginal bleeding   . Tobacco abuse Jan 2014    quit   Past Surgical History  Procedure Laterality Date  . Cholecystectomy    . Myomectomy  1990, 1997    x 2 - In 1997, noted to have extensive pelvic adhesions and BL tubal obstruction  . Colonoscopy  02/07/2010  . Upper gastrointestinal endoscopy  12/18/2009  . Hysteroscopy     Family History  Problem Relation Age of Onset  . COPD Mother   . Heart disease Father     had 5 heart attacks, first at age 6s.  Marland Kitchen COPD Father    History  Substance Use Topics  . Smoking status: Current Every Day Smoker -- 0.25 packs/day for 42 years    Types: Cigarettes    Last Attempt to Quit: 05/09/2012  . Smokeless tobacco: Never Used     Comment: Counseling sheet [id number]   . Alcohol Use: No   OB History    Gravida Para Term Preterm AB TAB SAB Ectopic Multiple Living   0 0 0 0 0 0 0 0 0 0      Review of Systems  Constitutional: Positive for fatigue. Negative for fever, chills and diaphoresis.  HENT: Negative for congestion.   Respiratory:  Negative for cough, chest tightness and shortness of breath.   Cardiovascular: Negative for chest pain.  Gastrointestinal: Positive for nausea and vomiting. Negative for abdominal pain, diarrhea, blood in stool and change in bowel habit.  Genitourinary: Positive for vaginal bleeding and menstrual problem.  Musculoskeletal: Negative for myalgias and gait problem.  Skin: Negative for rash.  Neurological: Negative for vertigo, weakness, light-headedness, numbness and headaches.  Psychiatric/Behavioral: Negative for confusion.  All other systems reviewed and are negative.   Allergies  Codeine  Home Medications   Prior to Admission medications   Medication  Sig Start Date End Date Taking? Authorizing Provider  albuterol (PROAIR HFA) 108 (90 BASE) MCG/ACT inhaler Inhale 2 puffs into the lungs every 6 (six) hours as needed for wheezing or shortness of breath. 08/04/14   Gildardo Cranker, DO  Aspirin-Acetaminophen-Caffeine [phone] MG PACK Take by mouth as needed.    Historical Provider, MD  Biotin 1000 MCG tablet Take 1,000 mcg by mouth 3 (three) times daily.    Historical Provider, MD  diphenhydrAMINE (BENADRYL) 25 MG tablet Take 25 mg by mouth as needed.    Historical Provider, MD  fluticasone (FLOVENT HFA) 44 MCG/ACT inhaler Inhale 2 puffs into the lungs two times daily 07/05/14   Gildardo Cranker, DO  glipiZIDE (GLUCOTROL XL) 5 MG 24 hr tablet Take one tablet by mouth once daily to control blood sugar 10/25/14   Gildardo Cranker, DO  glucose blood (ONETOUCH VERIO) test strip Use to self monitor blood glucose twice daily and as directed (E11.9) 07/17/14   Tivis Ringer, RPH-CPP  ibuprofen (ADVIL,MOTRIN) 200 MG tablet Take 200 mg by mouth as needed.    Historical Provider, MD  levothyroxine (SYNTHROID, LEVOTHROID) 125 MCG tablet TAKE ONE TABLET BY MOUTH ONCE DAILY BEFORE BREAKFAST 10/12/14   Gildardo Cranker, DO  lisinopril-hydrochlorothiazide (PRINZIDE,ZESTORETIC) 10-12.5 MG per tablet Take 1 tablet by mouth daily. 11/22/13 01/11/15  Ejiroghene Arlyce Dice, MD  megestrol (MEGACE) 40 MG tablet Take 1 tablet (40 mg total) by mouth 3 (three) times daily as needed (heavy vaginal bleeding). Patient not taking: Reported on 10/27/2014 02/24/14   Osborne Oman, MD  Melatonin 10 MG CAPS Take by mouth at bedtime.    Historical Provider, MD  metoprolol tartrate (LOPRESSOR) 25 MG tablet Take 1 tablet (25 mg total) by mouth 2 (two) times daily. 08/04/14   Monica Carter, DO  ONETOUCH DELICA LANCETS 78H MISC 1 each by Does not apply route 2 (two) times daily. 07/17/14   Tivis Ringer, RPH-CPP  ranitidine (ZANTAC) 150 MG tablet Take 150 mg by mouth 2 (two) times daily.    Historical  Provider, MD  rosuvastatin (CRESTOR) 40 MG tablet Take 1 tablet (40 mg total) by mouth daily. 07/03/14 07/03/15  Gildardo Cranker, DO  VALERIAN ROOT PO Take by mouth at bedtime as needed.    Historical Provider, MD   BP 110/69 mmHg  Pulse 74  Temp(Src) 98.3 F (36.8 C) (Oral)  Resp 18  Ht 5\' 3"  (1.6 m)  Wt 173 lb (78.472 kg)  BMI 30.65 kg/m2  SpO2 100%  LMP 02/27/2014   Filed Vitals:   10/27/14 1925 10/27/14 1927 10/27/14 1946 10/27/14 2000  BP: 104/64     Pulse: 75 75 80 72  Temp:      TempSrc:      Resp: 22 14 15 17   Height:      Weight:      SpO2: 97% 98% 100% 100%    Physical Exam  Constitutional: She is  oriented to person, place, and time. She appears well-developed and well-nourished. She is cooperative. She does not appear ill. No distress.  HENT:  Head: Normocephalic and atraumatic.  Nose: Nose normal.  Mouth/Throat: Oropharynx is clear and moist. No oropharyngeal exudate.  Eyes: EOM are normal. Pupils are equal, round, and reactive to light.  Neck: Normal range of motion. Neck supple.  Cardiovascular: Normal rate, regular rhythm, normal heart sounds and intact distal pulses.   No murmur heard. Pulmonary/Chest: Effort normal and breath sounds normal. No respiratory distress. She has no wheezes. She exhibits no tenderness.  Abdominal: Soft. There is no tenderness. There is no rebound and no guarding.  Musculoskeletal: Normal range of motion. She exhibits no tenderness.  Lymphadenopathy:    She has no cervical adenopathy.  Neurological: She is alert and oriented to person, place, and time. No cranial nerve deficit. Coordination normal.  Skin: Skin is warm and dry. She is not diaphoretic. No pallor.  Psychiatric: She has a normal mood and affect. Her behavior is normal. Judgment and thought content normal.  Nursing note and vitals reviewed.   ED Course  Procedures (including critical care time) Labs Review Labs Reviewed  COMPREHENSIVE METABOLIC PANEL - Abnormal;  Notable for the following:    Potassium 3.1 (*)    Chloride 92 (*)    BUN 35 (*)    Creatinine, Ser 1.51 (*)    GFR calc non Af Amer 37 (*)    GFR calc Af Amer 43 (*)    All other components within normal limits  URINALYSIS, ROUTINE W REFLEX MICROSCOPIC (NOT AT [facility]) - Abnormal; Notable for the following:    Hgb urine dipstick MODERATE (*)    Ketones, ur 15 (*)    All other components within normal limits  URINE MICROSCOPIC-ADD ON - Abnormal; Notable for the following:    Squamous Epithelial / LPF FEW (*)    Casts HYALINE CASTS (*)    All other components within normal limits  URINE CULTURE  CBC WITH DIFFERENTIAL/PLATELET  PROTIME-INR  LIPASE, BLOOD  TSH    Imaging Review No results found.   EKG Interpretation   Date/Time:  Friday October 27 2014 19:07:06 EDT Ventricular Rate:  74 PR Interval:  191 QRS Duration: 86 QT Interval:  401 QTC Calculation: 445 R Axis:   75 Text Interpretation:  Sinus rhythm Atrial premature complex since last  tracing no significant change Confirmed by BELFI  MD, MELANIE ([id number]) on  10/27/2014 9:07:58 PM      MDM   Final diagnoses:  Hypotension, unspecified hypotension type  Dehydration   Pt is a 59 yo F with hx of HLD, COPD, CVA, hypothyroidism, and chronic back pain who presents with hypotension.  Reports that she took her BP at home yesterday afternoon, per her usual routine, and found it was 80 systolic.  She has had a few days of nausea and rare NBNB emesis but she denies any change in urination, no fevers, and overall has not felt acutely ill.  She also endorses prolonged vaginal bleeding daily for months.  Has undergone a normal endometrial bx during work up by [facility] and had a D&C recommended but pt declined.  She denies any recent increase in bleeding.  No other known sources of blood loss.  Recently changed from metformin to glipizide, first dose yesterday.  Glucose was normal when her blood pressure was low at home yesterday.  No other  medication changes.  No recent illnesses prior to the N/V.  Is on BP meds (lisinopril/HCTZ and metoprolol) but no change recently.  No change in thyroxine either, and has had a stable dose for years.    BP 80 systolic at home yesterday.  Was 76/46 at PCP today, so sent to the ED for further evaluation. Asymptomatic and normotensive on arrival to the ED.  Initial BP 110/69  Worked up with labs, EKG, and urine.   Initially normotensive BP dropped to 80 systolic at 3976 so given NS bolus.  Patient remained asymptomatic.    Labs show AKI with Cr 1.5 from baseline of 0.8.  Due to hypotension and dehydration, I attempted to admit the patient for further IVF and monitoring.  However, patient not willing to be admitted.  She stated that she was worried about her cats at home and mom in the SNF.  Was alert and oriented and capable of making informed decisions.  She was able to discuss the risks and verbalized understanding of these.  She was agreeable to receiving a 2nd L of NS prior to dc, but would not agree to admission.    After the 2nd L of NS, she was discharged home.  Advised on strict ED return precautions.  Encouraged to check her BP several times this weekend and to call EMS if it drops below 73A systolic or if she gets any symptoms.     If performed, labs, EKGs, and imaging were reviewed and interpreted by myself and my attending, and incorporated in the medical decision making.  Patient was seen with ED Attending, Dr. Elisabeth Most, MD   Tori Milks, MD 10/28/14 [place], MD 10/28/14 1102

## 2014-10-29 ENCOUNTER — Other Ambulatory Visit: Payer: Self-pay | Admitting: Internal Medicine

## 2014-10-29 LAB — URINE CULTURE

## 2014-11-01 ENCOUNTER — Other Ambulatory Visit: Payer: Medicare Other

## 2014-11-01 DIAGNOSIS — E119 Type 2 diabetes mellitus without complications: Secondary | ICD-10-CM | POA: Diagnosis not present

## 2014-11-02 LAB — COMPREHENSIVE METABOLIC PANEL
ALT: 19 IU/L (ref 0–32)
AST: 27 IU/L (ref 0–40)
Albumin/Globulin Ratio: 1.9 (ref 1.1–2.5)
Albumin: 4.4 g/dL (ref 3.5–5.5)
Alkaline Phosphatase: 58 IU/L (ref 39–117)
BUN/Creatinine Ratio: 13 (ref 9–23)
BUN: 13 mg/dL (ref 6–24)
Bilirubin Total: 0.2 mg/dL (ref 0.0–1.2)
CO2: 27 mmol/L (ref 18–29)
Calcium: 10.1 mg/dL (ref 8.7–10.2)
Chloride: 95 mmol/L — ABNORMAL LOW (ref 97–108)
Creatinine, Ser: 1.01 mg/dL — ABNORMAL HIGH (ref 0.57–1.00)
GFR calc Af Amer: 70 mL/min/{1.73_m2} (ref >59)
GFR calc non Af Amer: 61 mL/min/{1.73_m2} (ref >59)
Globulin, Total: 2.3 g/dL (ref 1.5–4.5)
Glucose: 112 mg/dL — ABNORMAL HIGH (ref 65–99)
Potassium: 3.6 mmol/L (ref 3.5–5.2)
Sodium: 139 mmol/L (ref 134–144)
Total Protein: 6.7 g/dL (ref 6.0–8.5)

## 2014-11-02 LAB — HEMOGLOBIN A1C
Est. average glucose Bld gHb Est-mCnc: 131 mg/dL
Hgb A1c MFr Bld: 6.2 % — ABNORMAL HIGH (ref 4.8–5.6)

## 2014-11-03 ENCOUNTER — Ambulatory Visit: Payer: Medicare Other | Admitting: Internal Medicine

## 2014-11-03 ENCOUNTER — Other Ambulatory Visit: Payer: Medicare Other

## 2014-11-06 ENCOUNTER — Encounter: Payer: Self-pay | Admitting: *Deleted

## 2014-11-08 ENCOUNTER — Other Ambulatory Visit: Payer: Self-pay

## 2014-11-08 DIAGNOSIS — I10 Essential (primary) hypertension: Secondary | ICD-10-CM

## 2014-11-08 MED ORDER — LISINOPRIL-HYDROCHLOROTHIAZIDE 10-12.5 MG PO TABS: 1.0000 | ORAL_TABLET | Freq: Every day | ORAL | Status: DC

## 2014-11-10 ENCOUNTER — Ambulatory Visit (INDEPENDENT_AMBULATORY_CARE_PROVIDER_SITE_OTHER): Payer: Medicare Other | Admitting: Internal Medicine

## 2014-11-10 ENCOUNTER — Encounter: Payer: Self-pay | Admitting: Internal Medicine

## 2014-11-10 VITALS — BP 98/72 | HR 85 | Temp 98.2°F | Resp 20 | Ht 63.0 in | Wt 173.0 lb

## 2014-11-10 DIAGNOSIS — E034 Atrophy of thyroid (acquired): Secondary | ICD-10-CM | POA: Diagnosis not present

## 2014-11-10 DIAGNOSIS — G8929 Other chronic pain: Secondary | ICD-10-CM

## 2014-11-10 DIAGNOSIS — E119 Type 2 diabetes mellitus without complications: Secondary | ICD-10-CM | POA: Diagnosis not present

## 2014-11-10 DIAGNOSIS — Z720 Tobacco use: Secondary | ICD-10-CM | POA: Diagnosis not present

## 2014-11-10 DIAGNOSIS — E785 Hyperlipidemia, unspecified: Secondary | ICD-10-CM

## 2014-11-10 DIAGNOSIS — I9589 Other hypotension: Secondary | ICD-10-CM | POA: Diagnosis not present

## 2014-11-10 DIAGNOSIS — E038 Other specified hypothyroidism: Secondary | ICD-10-CM | POA: Diagnosis not present

## 2014-11-10 DIAGNOSIS — J45909 Unspecified asthma, uncomplicated: Secondary | ICD-10-CM | POA: Diagnosis not present

## 2014-11-10 DIAGNOSIS — M545 Low back pain: Secondary | ICD-10-CM | POA: Diagnosis not present

## 2014-11-10 DIAGNOSIS — I10 Essential (primary) hypertension: Secondary | ICD-10-CM

## 2014-11-10 MED ORDER — LISINOPRIL 10 MG PO TABS: 10.0000 mg | ORAL_TABLET | Freq: Every day | ORAL | Status: DC

## 2014-11-10 MED ORDER — OMEPRAZOLE 20 MG PO CPDR: 20.0000 mg | DELAYED_RELEASE_CAPSULE | Freq: Every day | ORAL | Status: DC

## 2014-11-10 NOTE — Progress Notes (Signed)
Patient ID: ADALINA DOPSON, female   DOB: 04/04/1956, 59 y.o.   MRN: [id number]    Location:    [facility]   Place of Service:   OFFICE  Chief Complaint  Patient presents with  . Medical Management of Chronic Issues    3 month follow-up    HPI:  59 yo female seen today for f/u. She was sent to the ER at her last OV due to hypotension. She rec'd 3 L IVF in the ED along with Potassium supplement. She was d'cd back home. No dizziness, CP, palpitations. She has intermittent SOB.  She is still smoking cigarettes. She continues to have abdominal pain but nausea and loose stools improved. She only has issues occasionally. BS 110-120s, occasionally 140s. Depression improved. Her mom is now home. Home BP 120-130s/60-70s. Weight is stable since last OV  BS stable on glipizide. No low BS reactions. No numbness or tingling  Thyroid controlled on levothyroxine  No asthma/COPD exacerbations. She uses flovent and prn albuterol  Omeprazole controls reflux sx's  She continues to smoke cigarettes and is not ready to quit  Past Medical History  Diagnosis Date  . GERD (gastroesophageal reflux disease)   . Hyperlipidemia   . Hypertension   . Migraine   . COPD (chronic obstructive pulmonary disease)   . Hypothyroidism     with hx of multinodular goiter (noted on neck US in 11/2002 - showing diffuse nodularity and inhomogenous texture diffusely BL)  . Asthma   . Hiatal hernia   . Barrett's esophagus     with high grade dysplasia per endoscopy 05/2005 // followed by Dr. [name] ([facility])  . History of CVA (cerebrovascular accident) 10/2000  . Endometrial polyp 03/2003    s/p resection in 03/2003. Path showing submucosal leiomyoma and benign  proliferative type endometrium  . Duodenal ulcer disease     thought to be contributed at least partly by overuse of NSAIDs and salicylates (goody powdr)  . Internal hemorrhoids     grade 2 per colonoscopy in 09/2006 - repeat colonoscopy rec in 5-10 years.  .  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 Fibroid uterus     s/p myomectomy x 2  . Tachycardia   . Nevus   . H/O failed moderate sedation   . Personal history of failed conscious sedation 08/11/2011  . Thyroid disease   . Chronic back pain   . Abnormal vaginal bleeding   . Tobacco abuse Jan 2014    quit    Past Surgical History  Procedure Laterality Date  . Cholecystectomy    . Myomectomy  1990, 1997    x 2 - In 1997, noted to have extensive pelvic adhesions and BL tubal obstruction  . Colonoscopy  02/07/2010  . Upper gastrointestinal endoscopy  12/18/2009  . Hysteroscopy      Patient Care Team: Gildardo Cranker, DO as PCP - General (Internal Medicine)  History   Social History  . Marital Status: Divorced    Spouse Name: N/A  . Number of Children: N/A  . Years of Education: N/A   Occupational History  . Not on file.   Social History Main Topics  . Smoking status: Current Every Day Smoker -- 0.25 packs/day for 42 years    Types: Cigarettes    Last Attempt to Quit: 05/09/2012  . Smokeless tobacco: Never Used  Comment: Counseling sheet [id number]   . Alcohol Use: No  . Drug Use: No  . Sexual Activity: No   Other Topics Concern  . Not on file   Social History Narrative         Financial assistance approved for 100% discount at [facility] and has [facility] card per Bonna Gains   01/15/2010      Diet- N/A   Caffeine- Goody powders   Married-Divorced   [employer] and lives with mother   Pets-2   Current/past profession-[employer]   Exercise-No   Living will-No   DNR-No   POA/HPOA-No              reports that she has been smoking Cigarettes.  She has a 10.5 pack-year smoking history. She has never used smokeless tobacco. She reports that she does not drink alcohol or use illicit drugs.  Allergies  Allergen Reactions  . Codeine     REACTION: headaches    Medications: Patient's  Medications  New Prescriptions   No medications on file  Previous Medications   ALBUTEROL (PROAIR HFA) 108 (90 BASE) MCG/ACT INHALER    Inhale 2 puffs into the lungs every 6 (six) hours as needed for wheezing or shortness of breath.   ASPIRIN-ACETAMINOPHEN-CAFFEINE 226-333-54 MG PACK    Take by mouth as needed.   BIOTIN 1000 MCG TABLET    Take 1,000 mcg by mouth 3 (three) times daily.   DIPHENHYDRAMINE (BENADRYL) 25 MG TABLET    Take 25 mg by mouth daily as needed for sleep.    FLOVENT HFA 44 MCG/ACT INHALER    INHALE TWO PUFFS INTO LUNGS TWICE DAILY   GLIPIZIDE (GLUCOTROL XL) 5 MG 24 HR TABLET    Take one tablet by mouth once daily to control blood sugar   GLUCOSE BLOOD (ONETOUCH VERIO) TEST STRIP    Use to self monitor blood glucose twice daily and as directed (E11.9)   IBUPROFEN (ADVIL,MOTRIN) 200 MG TABLET    Take 600 mg by mouth as needed for moderate pain.    KETOTIFEN FUMARATE (REFRESH EYE ITCH RELIEF OP)    Place 1 drop into both eyes daily as needed. For itchy eyes   LEVOTHYROXINE (SYNTHROID, LEVOTHROID) 125 MCG TABLET    TAKE ONE TABLET BY MOUTH ONCE DAILY BEFORE BREAKFAST   LISINOPRIL-HYDROCHLOROTHIAZIDE (PRINZIDE,ZESTORETIC) 10-12.5 MG PER TABLET    Take 1 tablet by mouth daily.   MEGESTROL (MEGACE) 40 MG TABLET    Take 1 tablet (40 mg total) by mouth 3 (three) times daily as needed (heavy vaginal bleeding).   MELATONIN 10 MG CAPS    Take by mouth at bedtime.   METOPROLOL TARTRATE (LOPRESSOR) 25 MG TABLET    Take 1 tablet (25 mg total) by mouth 2 (two) times daily.   ONETOUCH DELICA LANCETS 56Y MISC    1 each by Does not apply route 2 (two) times daily.   RANITIDINE (ZANTAC) 150 MG TABLET    Take 150 mg by mouth daily.    ROSUVASTATIN (CRESTOR) 40 MG TABLET    Take 1 tablet (40 mg total) by mouth daily.   VALERIAN ROOT PO    Take 1 tablet by mouth at bedtime.   Modified Medications   No medications on file  Discontinued Medications   No medications on file    Review of Systems   Constitutional: Negative for fever, chills, diaphoresis, activity change, appetite change and fatigue.  HENT: Negative for ear pain and sore throat.   Eyes: Negative  for visual disturbance.  Respiratory: Positive for shortness of breath. Negative for cough and chest tightness.   Cardiovascular: Negative for chest pain, palpitations and leg swelling.  Gastrointestinal: Negative for nausea, vomiting, abdominal pain, diarrhea, constipation and blood in stool.  Genitourinary: Negative for dysuria.  Musculoskeletal: Positive for back pain, joint swelling, arthralgias and gait problem.  Neurological: Negative for dizziness, tremors, numbness and headaches.  Psychiatric/Behavioral: Positive for sleep disturbance. The patient is not nervous/anxious.     Filed Vitals:   11/10/14 0907  BP: 98/72  Pulse: 85  Temp: 98.2 F (36.8 C)  TempSrc: Oral  Resp: 20  Height: 5' 3"  (1.6 m)  Weight: 173 lb (78.472 kg)  SpO2: 95%   Body mass index is 30.65 kg/(m^2).  Physical Exam  Constitutional: She is oriented to person, place, and time. She appears well-developed and well-nourished.  HENT:  Mouth/Throat: Oropharynx is clear and moist. No oropharyngeal exudate.  Eyes: Pupils are equal, round, and reactive to light. No scleral icterus.  Neck: Neck supple. Carotid bruit is not present. No tracheal deviation present. No thyromegaly present.  Cardiovascular: Normal rate, regular rhythm, normal heart sounds and intact distal pulses.  Exam reveals no gallop and no friction rub.   No murmur heard. No LE edema b/l. no calf TTP.   Pulmonary/Chest: Effort normal. No stridor. No respiratory distress. She has decreased breath sounds (b/l at base). She has no wheezes. She has no rhonchi. She has no rales.  Abdominal: Soft. Bowel sounds are normal. She exhibits no distension and no mass. There is no hepatomegaly. There is tenderness (epigastric). There is no rebound and no guarding.  Musculoskeletal: She exhibits  edema and tenderness.  Uses cane to ambulate  Lymphadenopathy:    She has no cervical adenopathy.  Neurological: She is alert and oriented to person, place, and time. She has normal reflexes.  Skin: Skin is warm and dry. No rash noted.  Psychiatric: She has a normal mood and affect. Her behavior is normal. Judgment and thought content normal.   Diabetic Foot Exam - Simple   Simple Foot Form  Diabetic Foot exam was performed with the following findings:  Yes 11/10/2014 10:22 AM  Visual Inspection  See comments:  Yes  Sensation Testing  Intact to touch and monofilament testing bilaterally:  Yes  Pulse Check  Posterior Tibialis and Dorsalis pulse intact bilaterally:  Yes  Comments  B/l bunion. No calluses or ulcerations       Labs reviewed: Appointment on 11/01/2014  Component Date Value Ref Range Status  . Hgb A1c MFr Bld 11/01/2014 6.2* 4.8 - 5.6 % Final   Comment:          Pre-diabetes: 5.7 - 6.4          Diabetes: >6.4          Glycemic control for adults with diabetes: <7.0   . Est. average glucose Bld gHb Est-m* 11/01/2014 131   Final  . Glucose 11/01/2014 112* 65 - 99 mg/dL Final  . BUN 11/01/2014 13  6 - 24 mg/dL Final  . Creatinine, Ser 11/01/2014 1.01* 0.57 - 1.00 mg/dL Final  . GFR calc non Af Amer 11/01/2014 61  >59 mL/min/1.73 Final  . GFR calc Af Amer 11/01/2014 70  >59 mL/min/1.73 Final  . BUN/Creatinine Ratio 11/01/2014 13  9 - 23 Final  . Sodium 11/01/2014 139  134 - 144 mmol/L Final  . Potassium 11/01/2014 3.6  3.5 - 5.2 mmol/L Final  . Chloride 11/01/2014  95* 97 - 108 mmol/L Final  . CO2 11/01/2014 27  18 - 29 mmol/L Final  . Calcium 11/01/2014 10.1  8.7 - 10.2 mg/dL Final  . Total Protein 11/01/2014 6.7  6.0 - 8.5 g/dL Final  . Albumin 11/01/2014 4.4  3.5 - 5.5 g/dL Final  . Globulin, Total 11/01/2014 2.3  1.5 - 4.5 g/dL Final  . Albumin/Globulin Ratio 11/01/2014 1.9  1.1 - 2.5 Final  . Bilirubin Total 11/01/2014 0.2  0.0 - 1.2 mg/dL Final  . Alkaline  Phosphatase 11/01/2014 58  39 - 117 IU/L Final  . AST 11/01/2014 27  0 - 40 IU/L Final  . ALT 11/01/2014 19  0 - 32 IU/L Final  Admission on 10/27/2014, Discharged on 10/27/2014  Component Date Value Ref Range Status  . WBC 10/27/2014 9.4  4.0 - 10.5 K/uL Final  . RBC 10/27/2014 4.11  3.87 - 5.11 MIL/uL Final  . Hemoglobin 10/27/2014 12.5  12.0 - 15.0 g/dL Final  . HCT 10/27/2014 38.9  36.0 - 46.0 % Final  . MCV 10/27/2014 94.6  78.0 - 100.0 fL Final  . MCH 10/27/2014 30.4  26.0 - 34.0 pg Final  . MCHC 10/27/2014 32.1  30.0 - 36.0 g/dL Final  . RDW 10/27/2014 14.3  11.5 - 15.5 % Final  . Platelets 10/27/2014 221  150 - 400 K/uL Final  . Neutrophils Relative % 10/27/2014 61  43 - 77 % Final  . Neutro Abs 10/27/2014 5.8  1.7 - 7.7 K/uL Final  . Lymphocytes Relative 10/27/2014 26  12 - 46 % Final  . Lymphs Abs 10/27/2014 2.4  0.7 - 4.0 K/uL Final  . Monocytes Relative 10/27/2014 10  3 - 12 % Final  . Monocytes Absolute 10/27/2014 0.9  0.1 - 1.0 K/uL Final  . Eosinophils Relative 10/27/2014 3  0 - 5 % Final  . Eosinophils Absolute 10/27/2014 0.3  0.0 - 0.7 K/uL Final  . Basophils Relative 10/27/2014 0  0 - 1 % Final  . Basophils Absolute 10/27/2014 0.0  0.0 - 0.1 K/uL Final  . Sodium 10/27/2014 135  135 - 145 mmol/L Final  . Potassium 10/27/2014 3.1* 3.5 - 5.1 mmol/L Final  . Chloride 10/27/2014 92* 101 - 111 mmol/L Final  . CO2 10/27/2014 29  22 - 32 mmol/L Final  . Glucose, Bld 10/27/2014 85  65 - 99 mg/dL Final  . BUN 10/27/2014 35* 6 - 20 mg/dL Final  . Creatinine, Ser 10/27/2014 1.51* 0.44 - 1.00 mg/dL Final  . Calcium 10/27/2014 9.7  8.9 - 10.3 mg/dL Final  . Total Protein 10/27/2014 6.9  6.5 - 8.1 g/dL Final  . Albumin 10/27/2014 4.1  3.5 - 5.0 g/dL Final  . AST 10/27/2014 24  15 - 41 U/L Final  . ALT 10/27/2014 22  14 - 54 U/L Final  . Alkaline Phosphatase 10/27/2014 54  38 - 126 U/L Final  . Total Bilirubin 10/27/2014 0.4  0.3 - 1.2 mg/dL Final  . GFR calc non Af Amer  10/27/2014 37* >60 mL/min Final  . GFR calc Af Amer 10/27/2014 43* >60 mL/min Final   Comment: (NOTE) The eGFR has been calculated using the CKD EPI equation. This calculation has not been validated in all clinical situations. eGFR's persistently <60 mL/min signify possible Chronic Kidney Disease.   . Anion gap 10/27/2014 14  5 - 15 Final  . Prothrombin Time 10/27/2014 13.3  11.6 - 15.2 seconds Final  . INR 10/27/2014 0.99  0.00 - 1.49  Final  . Specimen Description 10/27/2014 URINE, CLEAN CATCH   Final  . Special Requests 10/27/2014 NONE   Final  . Culture 10/27/2014 MULTIPLE SPECIES PRESENT, SUGGEST RECOLLECTION IF CLINICALLY INDICATED   Final  . Report Status 10/27/2014 10/29/2014 FINAL   Final  . Color, Urine 10/27/2014 YELLOW  YELLOW Final  . APPearance 10/27/2014 CLEAR  CLEAR Final  . Specific Gravity, Urine 10/27/2014 1.012  1.005 - 1.030 Final  . pH 10/27/2014 7.0  5.0 - 8.0 Final  . Glucose, UA 10/27/2014 NEGATIVE  NEGATIVE mg/dL Final  . Hgb urine dipstick 10/27/2014 MODERATE* NEGATIVE Final  . Bilirubin Urine 10/27/2014 NEGATIVE  NEGATIVE Final  . Ketones, ur 10/27/2014 15* NEGATIVE mg/dL Final  . Protein, ur 10/27/2014 NEGATIVE  NEGATIVE mg/dL Final  . Urobilinogen, UA 10/27/2014 0.2  0.0 - 1.0 mg/dL Final  . Nitrite 10/27/2014 NEGATIVE  NEGATIVE Final  . Leukocytes, UA 10/27/2014 NEGATIVE  NEGATIVE Final  . Lipase 10/27/2014 29  22 - 51 U/L Final  . TSH 10/27/2014 4.354  0.350 - 4.500 uIU/mL Final  . Squamous Epithelial / LPF 10/27/2014 FEW* RARE Final  . WBC, UA 10/27/2014 0-2  <3 WBC/hpf Final  . RBC / HPF 10/27/2014 0-2  <3 RBC/hpf Final  . Bacteria, UA 10/27/2014 RARE  RARE Final  . Casts 10/27/2014 HYALINE CASTS* NEGATIVE Final  Office Visit on 08/21/2014  Component Date Value Ref Range Status  . WBC 08/21/2014 10.3  4.0 - 10.5 K/uL Final  . RBC 08/21/2014 4.62  3.87 - 5.11 MIL/uL Final  . Hemoglobin 08/21/2014 13.8  12.0 - 15.0 g/dL Final  . HCT 08/21/2014  41.4  36.0 - 46.0 % Final  . MCV 08/21/2014 89.6  78.0 - 100.0 fL Final  . MCH 08/21/2014 29.9  26.0 - 34.0 pg Final  . MCHC 08/21/2014 33.3  30.0 - 36.0 g/dL Final  . RDW 08/21/2014 13.7  11.5 - 15.5 % Final  . Platelets 08/21/2014 283  150 - 400 K/uL Final  . MPV 08/21/2014 11.6  8.6 - 12.4 fL Final  . Neutrophils Relative % 08/21/2014 62  43 - 77 % Final  . Neutro Abs 08/21/2014 6.4  1.7 - 7.7 K/uL Final  . Lymphocytes Relative 08/21/2014 26  12 - 46 % Final  . Lymphs Abs 08/21/2014 2.7  0.7 - 4.0 K/uL Final  . Monocytes Relative 08/21/2014 9  3 - 12 % Final  . Monocytes Absolute 08/21/2014 0.9  0.1 - 1.0 K/uL Final  . Eosinophils Relative 08/21/2014 3  0 - 5 % Final  . Eosinophils Absolute 08/21/2014 0.3  0.0 - 0.7 K/uL Final  . Basophils Relative 08/21/2014 0  0 - 1 % Final  . Basophils Absolute 08/21/2014 0.0  0.0 - 0.1 K/uL Final  . Smear Review 08/21/2014 Criteria for review not met   Final  . CA 125 08/21/2014 101* <35 U/mL Final   Comment:   This test was performed using the Beckman Coulter chemiluminescent method.  Values obtained from different assay methods cannot be used interchangeably.  CA125 levels , regardless of value, should not be interpreted as absolute evidence of the presence or absence of disease.     No results found.   Assessment/Plan   ICD-9-CM ICD-10-CM   1. Other specified hypotension - improving 458.8 I95.89   2. Essential hypertension - controlled 401.9 I10 lisinopril (PRINIVIL,ZESTRIL) 10 MG tablet  3. Hyperlipidemia LDL goal <100 - stable 272.4 E78.5 Lipid Panel  4. Hypothyroidism due to acquired  atrophy of thyroid - stable 244.8 E03.8    246.8 E03.4   5. Type 2 diabetes mellitus without complication - controlled 250.00 E11.9   6. Chronic low back pain - stable 724.2 M54.5    338.29 G89.29   7. Asthma, unspecified asthma severity, uncomplicated - stable 177.93 J45.909   8. Tobacco abuse - continuous 305.1 Z72.0    ---smoking cessation  discussed and highly urged  --STOP lisinopril HCT and ranitidine  --Continue all other medications as ordered  --Follow up in 2 mos (fasting lab 2-3 days prior to appt) for HTN and low blood pressure  Hinda Lindor S. Perlie Gold  [facility] [address] [place], [place] [address] [phone] Cell (Monday-Friday 8 AM - 5 PM) [phone] After 5 PM and follow prompts

## 2014-11-10 NOTE — Patient Instructions (Addendum)
STOP lisinopril HCT and ranitidine  Continue all other medications as ordered  Follow up in 2 mos (fasting lab 2-3 days prior to appt) for HTN and low blood pressure

## 2014-11-13 ENCOUNTER — Ambulatory Visit: Payer: Medicare Other | Admitting: Pharmacotherapy

## 2014-11-28 ENCOUNTER — Telehealth: Payer: Self-pay | Admitting: *Deleted

## 2014-11-28 NOTE — Telephone Encounter (Signed)
Patient called and stated that they placed her mother in [facility] and she does not have much longer to live. Patient very upset and request something for her nerves to help her through it. Please Advise.

## 2014-11-29 ENCOUNTER — Other Ambulatory Visit: Payer: Self-pay | Admitting: *Deleted

## 2014-11-29 ENCOUNTER — Other Ambulatory Visit: Payer: Self-pay | Admitting: Internal Medicine

## 2014-11-29 MED ORDER — LORAZEPAM 0.5 MG PO TABS: 0.5000 mg | ORAL_TABLET | Freq: Three times a day (TID) | ORAL | Status: DC | PRN

## 2014-11-29 NOTE — Progress Notes (Signed)
Patient called needing something for her nerves, she stated that her mother passed away last night. I informed her that I have called in some medication for to pick-up.

## 2014-11-29 NOTE — Telephone Encounter (Signed)
[place] for lorazepam #30 take 0.5 - 1 tab po TID prn nervousness. No RF

## 2014-11-30 NOTE — Telephone Encounter (Signed)
Grace [name] called into pharmacy and spoke with patient.

## 2014-12-14 ENCOUNTER — Other Ambulatory Visit: Payer: Self-pay | Admitting: *Deleted

## 2014-12-14 MED ORDER — METOPROLOL TARTRATE 25 MG PO TABS: 25.0000 mg | ORAL_TABLET | Freq: Two times a day (BID) | ORAL | Status: DC

## 2014-12-14 NOTE — Telephone Encounter (Signed)
Patient called and requested refill to be faxed to pharmacy. Faxed. 

## 2014-12-20 ENCOUNTER — Other Ambulatory Visit: Payer: Self-pay | Admitting: Internal Medicine

## 2014-12-28 ENCOUNTER — Other Ambulatory Visit: Payer: Self-pay | Admitting: Internal Medicine

## 2015-01-09 ENCOUNTER — Other Ambulatory Visit: Payer: Medicare Other

## 2015-01-09 DIAGNOSIS — E785 Hyperlipidemia, unspecified: Secondary | ICD-10-CM

## 2015-01-10 LAB — LIPID PANEL
CHOL/HDL RATIO: 2.7 ratio (ref 0.0–4.4)
Cholesterol, Total: 127 mg/dL (ref 100–199)
HDL: 47 mg/dL (ref >39)
LDL Calculated: 55 mg/dL (ref 0–99)
Triglycerides: 125 mg/dL (ref 0–149)
VLDL CHOLESTEROL CAL: 25 mg/dL (ref 5–40)

## 2015-01-12 ENCOUNTER — Ambulatory Visit (INDEPENDENT_AMBULATORY_CARE_PROVIDER_SITE_OTHER): Payer: Medicare Other | Admitting: Internal Medicine

## 2015-01-12 ENCOUNTER — Encounter: Payer: Self-pay | Admitting: Internal Medicine

## 2015-01-12 VITALS — BP 132/93 | HR 96 | Temp 98.3°F | Resp 20 | Ht 63.0 in | Wt 172.0 lb

## 2015-01-12 DIAGNOSIS — E119 Type 2 diabetes mellitus without complications: Secondary | ICD-10-CM

## 2015-01-12 DIAGNOSIS — F4321 Adjustment disorder with depressed mood: Secondary | ICD-10-CM

## 2015-01-12 DIAGNOSIS — Z716 Tobacco abuse counseling: Secondary | ICD-10-CM | POA: Diagnosis not present

## 2015-01-12 DIAGNOSIS — E038 Other specified hypothyroidism: Secondary | ICD-10-CM

## 2015-01-12 DIAGNOSIS — G4700 Insomnia, unspecified: Secondary | ICD-10-CM | POA: Diagnosis not present

## 2015-01-12 DIAGNOSIS — E034 Atrophy of thyroid (acquired): Secondary | ICD-10-CM

## 2015-01-12 DIAGNOSIS — I10 Essential (primary) hypertension: Secondary | ICD-10-CM | POA: Diagnosis not present

## 2015-01-12 DIAGNOSIS — E785 Hyperlipidemia, unspecified: Secondary | ICD-10-CM

## 2015-01-12 DIAGNOSIS — Z23 Encounter for immunization: Secondary | ICD-10-CM

## 2015-01-12 DIAGNOSIS — J45909 Unspecified asthma, uncomplicated: Secondary | ICD-10-CM | POA: Diagnosis not present

## 2015-01-12 MED ORDER — ALBUTEROL SULFATE HFA 108 (90 BASE) MCG/ACT IN AERS: 2.0000 | INHALATION_SPRAY | Freq: Four times a day (QID) | RESPIRATORY_TRACT | Status: DC | PRN

## 2015-01-12 MED ORDER — FLUTICASONE PROPIONATE HFA 44 MCG/ACT IN AERO: INHALATION_SPRAY | RESPIRATORY_TRACT | Status: DC

## 2015-01-12 MED ORDER — TRAZODONE HCL 50 MG PO TABS: 50.0000 mg | ORAL_TABLET | Freq: Every evening | ORAL | Status: DC | PRN

## 2015-01-12 NOTE — Progress Notes (Signed)
Patient ID: Grace [name], female   DOB: 1955/04/28, 59 y.o.   MRN: [id number]    Location:    [facility]   Place of Service:  OFFICE   Chief Complaint  Patient presents with  . Medical Management of Chronic Issues    2 onth follow-up for HTN, and low blood pressure  . Immunizations    Will take flu shot today    HPI:  59 yo female sen today for f/u. She reports grief over mother's death in 2023/01/03. (+) insomnia and wakes up every hr. No relief with lorazepam. Takes melatonin 88m. She has asked for hospice support group and is awaiting a call back.    COPD/asthma - no recent exacerbations. She takes flovent, proair.  DM - BS fasting 10-110s and occasionally 150-160s. Takes glipizide daily. No low BS reactions. No numbness or tingling  HTN - borderline controlled on lopressor  Hyperlipidemia - no myalgias on crestor  Hypothyroid - stable on levothyroxine. No cold/hot intolerance  GERD/HH/Barrett's esophagus - acid reflux stable. Takes PPI but requires intermittent mylanta or maalox  Tobacco abuse - she is still smoking and has increased amt since mother's death. No quit plans. We discussed dangers of continued tobacco abuse    Past Medical History  Diagnosis Date  . GERD (gastroesophageal reflux disease)   . Hyperlipidemia   . Hypertension   . Migraine   . COPD (chronic obstructive pulmonary disease)   . Hypothyroidism     with hx of multinodular goiter (noted on neck [facility] 009/21/2004- showing diffuse nodularity and inhomogenous texture diffusely BL)  . Asthma   . Hiatal hernia   . Barrett's esophagus     with high grade dysplasia per endoscopy 05/2005 // followed by Dr. CSilvano Rusk([facility])  . History of CVA (cerebrovascular accident) 10/2000  . Endometrial polyp 03/2003    s/p resection in 03/2003. Path showing submucosal leiomyoma and benign  proliferative type endometrium  . Duodenal ulcer disease     thought to be contributed at least partly by overuse of NSAIDs and  salicylates (goody powdr)  . Internal hemorrhoids     grade 2 per colonoscopy in 09/2006 - repeat colonoscopy rec in 5-10 years.  . 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 Fibroid uterus     s/p myomectomy x 2  . Tachycardia   . Nevus   . H/O failed moderate sedation   . Personal history of failed conscious sedation 08/11/2011  . Thyroid disease   . Chronic back pain   . Abnormal vaginal bleeding   . Tobacco abuse Jan 2014    quit    Past Surgical History  Procedure Laterality Date  . Cholecystectomy    . Myomectomy  1990, 1997    x 2 - In 1997, noted to have extensive pelvic adhesions and BL tubal obstruction  . Colonoscopy  02/07/2010  . Upper gastrointestinal endoscopy  12/18/2009  . Hysteroscopy      Patient Care Team: MGildardo Cranker DO as PCP - General (Internal Medicine)  Social History   Social History  . Marital Status: Divorced    Spouse Name: N/A  . Number of Children: N/A  . Years of Education: N/A   Occupational History  . Not on file.   Social History Main Topics  . Smoking status: Current Every Day Smoker --  0.25 packs/day for 42 years    Types: Cigarettes    Last Attempt to Quit: 05/09/2012  . Smokeless tobacco: Never Used     Comment: Counseling sheet [id number]   . Alcohol Use: No  . Drug Use: No  . Sexual Activity: No   Other Topics Concern  . Not on file   Social History Narrative         Financial assistance approved for 100% discount at [facility] and has [facility] card per Bonna Gains   01/15/2010      Diet- N/A   Caffeine- Goody powders   Married-Divorced   [employer] and lives with mother   Pets-2   Current/past profession-[employer]   Exercise-No   Living will-No   DNR-No   POA/HPOA-No              reports that she has been smoking Cigarettes.  She has a 10.5 pack-year smoking history. She has never used smokeless tobacco. She reports that she does  not drink alcohol or use illicit drugs.  Allergies  Allergen Reactions  . Codeine     REACTION: headaches    Medications: Patient's Medications  New Prescriptions   No medications on file  Previous Medications   ALBUTEROL (PROAIR HFA) 108 (90 BASE) MCG/ACT INHALER    Inhale 2 puffs into the lungs every 6 (six) hours as needed for wheezing or shortness of breath.   ASPIRIN-ACETAMINOPHEN-CAFFEINE 656-812-75 MG PACK    Take by mouth as needed.   BIOTIN 1000 MCG TABLET    Take 1,000 mcg by mouth 3 (three) times daily.   CRESTOR 40 MG TABLET    TAKE ONE TABLET BY MOUTH ONCE DAILY   DIPHENHYDRAMINE (BENADRYL) 25 MG TABLET    Take 25 mg by mouth daily as needed for sleep.    FLOVENT HFA 44 MCG/ACT INHALER    INHALE TWO PUFFS INTO LUNGS TWICE DAILY   GLIPIZIDE (GLUCOTROL XL) 5 MG 24 HR TABLET    Take one tablet by mouth once daily to control blood sugar   GLUCOSE BLOOD (ONETOUCH VERIO) TEST STRIP    Use to self monitor blood glucose twice daily and as directed (E11.9)   IBUPROFEN (ADVIL,MOTRIN) 200 MG TABLET    Take 600 mg by mouth as needed for moderate pain.    KETOTIFEN FUMARATE (REFRESH EYE ITCH RELIEF OP)    Place 1 drop into both eyes daily as needed. For itchy eyes   LEVOTHYROXINE (SYNTHROID, LEVOTHROID) 125 MCG TABLET    TAKE ONE TABLET BY MOUTH ONCE DAILY BEFORE BREAKFAST   LISINOPRIL (PRINIVIL,ZESTRIL) 10 MG TABLET    Take 1 tablet (10 mg total) by mouth daily.   LORAZEPAM (ATIVAN) 0.5 MG TABLET    TAKE ONE TABLET BY MOUTH THREE TIMES DAILY AS NEEDED FOR  NERVOUSNESS  OR  ANXIETY   MELATONIN 10 MG CAPS    Take by mouth at bedtime.   METOPROLOL TARTRATE (LOPRESSOR) 25 MG TABLET    Take 1 tablet (25 mg total) by mouth 2 (two) times daily.   OMEPRAZOLE (PRILOSEC) 20 MG CAPSULE    Take 1 capsule (20 mg total) by mouth daily.   ONETOUCH DELICA LANCETS 17G MISC    1 each by Does not apply route 2 (two) times daily.   VALERIAN ROOT PO    Take 1 tablet by mouth at bedtime.   Modified  Medications   No medications on file  Discontinued Medications   MEGESTROL (MEGACE) 40 MG TABLET  Take 1 tablet (40 mg total) by mouth 3 (three) times daily as needed (heavy vaginal bleeding).    Review of Systems  Constitutional: Negative for fever, chills, diaphoresis, activity change, appetite change and fatigue.  HENT: Negative for ear pain and sore throat.   Eyes: Negative for visual disturbance.  Respiratory: Negative for cough, chest tightness and shortness of breath.   Cardiovascular: Negative for chest pain, palpitations and leg swelling.  Gastrointestinal: Negative for nausea, vomiting, abdominal pain, diarrhea, constipation and blood in stool.  Genitourinary: Negative for dysuria.  Musculoskeletal: Negative for arthralgias.  Neurological: Negative for dizziness, tremors, numbness and headaches.  Psychiatric/Behavioral: Positive for sleep disturbance. The patient is nervous/anxious.     Filed Vitals:   01/12/15 1040  BP: 132/93  Pulse: 96  Temp: 98.3 F (36.8 C)  TempSrc: Oral  Resp: 20  Height: 5' 3"  (1.6 m)  Weight: 172 lb (78.019 kg)  SpO2: 93%   Body mass index is 30.48 kg/(m^2).  Physical Exam  Constitutional: She is oriented to person, place, and time. She appears well-developed and well-nourished.  HENT:  Mouth/Throat: Oropharynx is clear and moist. No oropharyngeal exudate.  Eyes: Pupils are equal, round, and reactive to light. No scleral icterus.  Neck: Neck supple. Carotid bruit is not present. No tracheal deviation present. No thyromegaly present.  Cardiovascular: Normal rate, regular rhythm, normal heart sounds and intact distal pulses.  Exam reveals no gallop and no friction rub.   No murmur heard. No LE edema b/l. no calf TTP.   Pulmonary/Chest: Effort normal and breath sounds normal. No stridor. No respiratory distress. She has no wheezes. She has no rales.  Abdominal: Soft. Bowel sounds are normal. She exhibits no distension and no mass. There is  no hepatomegaly. There is tenderness (epigastric but no r/g/r). There is no rebound and no guarding.  Lymphadenopathy:    She has no cervical adenopathy.  Neurological: She is alert and oriented to person, place, and time.  Skin: Skin is warm and dry. No rash noted.  Psychiatric: Her behavior is normal. Judgment and thought content normal. She exhibits a depressed mood.  Flat affect     Labs reviewed: Appointment on 01/09/2015  Component Date Value Ref Range Status  . Cholesterol, Total 01/09/2015 127  100 - 199 mg/dL Final  . Triglycerides 01/09/2015 125  0 - 149 mg/dL Final  . HDL 01/09/2015 47  >39 mg/dL Final   Comment: According to ATP-III Guidelines, HDL-C >59 mg/dL is considered a negative risk factor for CHD.   Marland Kitchen VLDL Cholesterol Cal 01/09/2015 25  5 - 40 mg/dL Final  . LDL Calculated 01/09/2015 55  0 - 99 mg/dL Final  . Chol/HDL Ratio 01/09/2015 2.7  0.0 - 4.4 ratio units Final   Comment:                                   T. Chol/HDL Ratio                                             Men  Women                               1/2 Avg.Risk  3.4    3.3  Avg.Risk  5.0    4.4                                2X Avg.Risk  9.6    7.1                                3X Avg.Risk 23.4   11.0   Appointment on 11/01/2014  Component Date Value Ref Range Status  . Hgb A1c MFr Bld 11/01/2014 6.2* 4.8 - 5.6 % Final   Comment:          Pre-diabetes: 5.7 - 6.4          Diabetes: >6.4          Glycemic control for adults with diabetes: <7.0   . Est. average glucose Bld gHb Est-m* 11/01/2014 131   Final  . Glucose 11/01/2014 112* 65 - 99 mg/dL Final  . BUN 11/01/2014 13  6 - 24 mg/dL Final  . Creatinine, Ser 11/01/2014 1.01* 0.57 - 1.00 mg/dL Final  . GFR calc non Af Amer 11/01/2014 61  >59 mL/min/1.73 Final  . GFR calc Af Amer 11/01/2014 70  >59 mL/min/1.73 Final  . BUN/Creatinine Ratio 11/01/2014 13  9 - 23 Final  . Sodium 11/01/2014 139  134 - 144 mmol/L  Final  . Potassium 11/01/2014 3.6  3.5 - 5.2 mmol/L Final  . Chloride 11/01/2014 95* 97 - 108 mmol/L Final  . CO2 11/01/2014 27  18 - 29 mmol/L Final  . Calcium 11/01/2014 10.1  8.7 - 10.2 mg/dL Final  . Total Protein 11/01/2014 6.7  6.0 - 8.5 g/dL Final  . Albumin 11/01/2014 4.4  3.5 - 5.5 g/dL Final  . Globulin, Total 11/01/2014 2.3  1.5 - 4.5 g/dL Final  . Albumin/Globulin Ratio 11/01/2014 1.9  1.1 - 2.5 Final  . Bilirubin Total 11/01/2014 0.2  0.0 - 1.2 mg/dL Final  . Alkaline Phosphatase 11/01/2014 58  39 - 117 IU/L Final  . AST 11/01/2014 27  0 - 40 IU/L Final  . ALT 11/01/2014 19  0 - 32 IU/L Final  Admission on 10/27/2014, Discharged on 10/27/2014  Component Date Value Ref Range Status  . WBC 10/27/2014 9.4  4.0 - 10.5 K/uL Final  . RBC 10/27/2014 4.11  3.87 - 5.11 MIL/uL Final  . Hemoglobin 10/27/2014 12.5  12.0 - 15.0 g/dL Final  . HCT 10/27/2014 38.9  36.0 - 46.0 % Final  . MCV 10/27/2014 94.6  78.0 - 100.0 fL Final  . MCH 10/27/2014 30.4  26.0 - 34.0 pg Final  . MCHC 10/27/2014 32.1  30.0 - 36.0 g/dL Final  . RDW 10/27/2014 14.3  11.5 - 15.5 % Final  . Platelets 10/27/2014 221  150 - 400 K/uL Final  . Neutrophils Relative % 10/27/2014 61  43 - 77 % Final  . Neutro Abs 10/27/2014 5.8  1.7 - 7.7 K/uL Final  . Lymphocytes Relative 10/27/2014 26  12 - 46 % Final  . Lymphs Abs 10/27/2014 2.4  0.7 - 4.0 K/uL Final  . Monocytes Relative 10/27/2014 10  3 - 12 % Final  . Monocytes Absolute 10/27/2014 0.9  0.1 - 1.0 K/uL Final  . Eosinophils Relative 10/27/2014 3  0 - 5 % Final  . Eosinophils Absolute 10/27/2014 0.3  0.0 - 0.7 K/uL Final  . Basophils Relative 10/27/2014 0  0 - 1 %  Final  . Basophils Absolute 10/27/2014 0.0  0.0 - 0.1 K/uL Final  . Sodium 10/27/2014 135  135 - 145 mmol/L Final  . Potassium 10/27/2014 3.1* 3.5 - 5.1 mmol/L Final  . Chloride 10/27/2014 92* 101 - 111 mmol/L Final  . CO2 10/27/2014 29  22 - 32 mmol/L Final  . Glucose, Bld 10/27/2014 85  65 - 99  mg/dL Final  . BUN 10/27/2014 35* 6 - 20 mg/dL Final  . Creatinine, Ser 10/27/2014 1.51* 0.44 - 1.00 mg/dL Final  . Calcium 10/27/2014 9.7  8.9 - 10.3 mg/dL Final  . Total Protein 10/27/2014 6.9  6.5 - 8.1 g/dL Final  . Albumin 10/27/2014 4.1  3.5 - 5.0 g/dL Final  . AST 10/27/2014 24  15 - 41 U/L Final  . ALT 10/27/2014 22  14 - 54 U/L Final  . Alkaline Phosphatase 10/27/2014 54  38 - 126 U/L Final  . Total Bilirubin 10/27/2014 0.4  0.3 - 1.2 mg/dL Final  . GFR calc non Af Amer 10/27/2014 37* >60 mL/min Final  . GFR calc Af Amer 10/27/2014 43* >60 mL/min Final   Comment: (NOTE) The eGFR has been calculated using the CKD EPI equation. This calculation has not been validated in all clinical situations. eGFR's persistently <60 mL/min signify possible Chronic Kidney Disease.   . Anion gap 10/27/2014 14  5 - 15 Final  . Prothrombin Time 10/27/2014 13.3  11.6 - 15.2 seconds Final  . INR 10/27/2014 0.99  0.00 - 1.49 Final  . Specimen Description 10/27/2014 URINE, CLEAN CATCH   Final  . Special Requests 10/27/2014 NONE   Final  . Culture 10/27/2014 MULTIPLE SPECIES PRESENT, SUGGEST RECOLLECTION IF CLINICALLY INDICATED   Final  . Report Status 10/27/2014 10/29/2014 FINAL   Final  . Color, Urine 10/27/2014 YELLOW  YELLOW Final  . APPearance 10/27/2014 CLEAR  CLEAR Final  . Specific Gravity, Urine 10/27/2014 1.012  1.005 - 1.030 Final  . pH 10/27/2014 7.0  5.0 - 8.0 Final  . Glucose, UA 10/27/2014 NEGATIVE  NEGATIVE mg/dL Final  . Hgb urine dipstick 10/27/2014 MODERATE* NEGATIVE Final  . Bilirubin Urine 10/27/2014 NEGATIVE  NEGATIVE Final  . Ketones, ur 10/27/2014 15* NEGATIVE mg/dL Final  . Protein, ur 10/27/2014 NEGATIVE  NEGATIVE mg/dL Final  . Urobilinogen, UA 10/27/2014 0.2  0.0 - 1.0 mg/dL Final  . Nitrite 10/27/2014 NEGATIVE  NEGATIVE Final  . Leukocytes, UA 10/27/2014 NEGATIVE  NEGATIVE Final  . Lipase 10/27/2014 29  22 - 51 U/L Final  . TSH 10/27/2014 4.354  0.350 - 4.500 uIU/mL  Final  . Squamous Epithelial / LPF 10/27/2014 FEW* RARE Final  . WBC, UA 10/27/2014 0-2  <3 WBC/hpf Final  . RBC / HPF 10/27/2014 0-2  <3 RBC/hpf Final  . Bacteria, UA 10/27/2014 RARE  RARE Final  . Casts 10/27/2014 HYALINE CASTS* NEGATIVE Final    No results found.   Assessment/Plan   ICD-9-CM ICD-10-CM   1. Grief reaction due to mother's death 309.0 F43.21   2. Insomnia - unchanged 780.52 G47.00 traZODone (DESYREL) 50 MG tablet  3. Need for influenza vaccination V04.81 Z23 Flu Vaccine QUAD 36+ mos PF IM (Fluarix & Fluzone Quad PF)  4. Type 2 diabetes mellitus without complication 235.57 D22.0   5. Essential hypertension 401.9 I10   6. Hypothyroidism due to acquired atrophy of thyroid 244.8 E03.8    246.8 E03.4   7. Hyperlipidemia LDL goal <100 272.4 E78.5   8. Asthma, unspecified asthma severity, uncomplicated 254.27 C62.376  albuterol (PROAIR HFA) 108 (90 BASE) MCG/ACT inhaler     fluticasone (FLOVENT HFA) 44 MCG/ACT inhaler  9. Tobacco abuse counseling V65.42 Z71.6    305.1      Continue lorazepam as needed for anxiety  Recommend follow through with hospice grief counseling/support group  Start trazodone as needed to help sleep at night  Continue other medications as ordered  Smoking cessation discussed >5 minutes. She is not ready to quit  Flu shot given today  Follow up in 3 mos for routine visit. Fasting labs 2-3 days prior to appt  [place] [place] Perlie Gold  [facility] [address] [place], [place] [address] [phone] Cell (Monday-Friday 8 AM - 5 PM) [phone] After 5 PM and follow prompts

## 2015-01-12 NOTE — Patient Instructions (Addendum)
Continue lorazepam as needed for anxiety  Start trazodone as needed to help sleep at night  Continue other medications as ordered  Follow up in 3 mos for routine visit. Fasting labs 2-3 days prior to appt

## 2015-01-13 DIAGNOSIS — G4700 Insomnia, unspecified: Secondary | ICD-10-CM | POA: Insufficient documentation

## 2015-01-13 DIAGNOSIS — E119 Type 2 diabetes mellitus without complications: Secondary | ICD-10-CM | POA: Insufficient documentation

## 2015-01-26 ENCOUNTER — Other Ambulatory Visit: Payer: Self-pay | Admitting: Internal Medicine

## 2015-02-08 ENCOUNTER — Other Ambulatory Visit: Payer: Self-pay | Admitting: Internal Medicine

## 2015-03-07 ENCOUNTER — Other Ambulatory Visit: Payer: Self-pay | Admitting: Internal Medicine

## 2015-03-14 ENCOUNTER — Encounter: Payer: Self-pay | Admitting: Internal Medicine

## 2015-03-17 ENCOUNTER — Other Ambulatory Visit: Payer: Self-pay | Admitting: Internal Medicine

## 2015-03-20 DIAGNOSIS — H524 Presbyopia: Secondary | ICD-10-CM | POA: Diagnosis not present

## 2015-03-20 DIAGNOSIS — E119 Type 2 diabetes mellitus without complications: Secondary | ICD-10-CM | POA: Diagnosis not present

## 2015-03-20 LAB — HM DIABETES EYE EXAM

## 2015-03-21 DIAGNOSIS — M25521 Pain in right elbow: Secondary | ICD-10-CM | POA: Diagnosis not present

## 2015-03-21 DIAGNOSIS — M7021 Olecranon bursitis, right elbow: Secondary | ICD-10-CM | POA: Diagnosis not present

## 2015-03-22 ENCOUNTER — Telehealth: Payer: Self-pay

## 2015-03-22 NOTE — Telephone Encounter (Signed)
I called patient to see is she had an eye exam recently or if we need to schedule for her. Per patient, last eye exam was 03/20/15 at [facility]. I called [phone] and requested a copy of report.

## 2015-03-23 ENCOUNTER — Encounter: Payer: Self-pay | Admitting: *Deleted

## 2015-04-04 DIAGNOSIS — M7021 Olecranon bursitis, right elbow: Secondary | ICD-10-CM | POA: Diagnosis not present

## 2015-04-04 DIAGNOSIS — M25421 Effusion, right elbow: Secondary | ICD-10-CM | POA: Diagnosis not present

## 2015-04-04 DIAGNOSIS — M25521 Pain in right elbow: Secondary | ICD-10-CM | POA: Diagnosis not present

## 2015-04-15 HISTORY — PX: APPENDECTOMY: SHX54

## 2015-04-18 ENCOUNTER — Other Ambulatory Visit: Payer: Medicare Other

## 2015-04-18 ENCOUNTER — Other Ambulatory Visit: Payer: Self-pay | Admitting: Internal Medicine

## 2015-04-20 ENCOUNTER — Ambulatory Visit: Payer: Medicare Other | Admitting: Internal Medicine

## 2015-04-23 ENCOUNTER — Other Ambulatory Visit: Payer: Medicare Other

## 2015-04-25 ENCOUNTER — Ambulatory Visit: Payer: Medicare Other | Admitting: Internal Medicine

## 2015-05-15 ENCOUNTER — Other Ambulatory Visit: Payer: Self-pay | Admitting: Internal Medicine

## 2015-05-30 ENCOUNTER — Telehealth: Payer: Self-pay | Admitting: Internal Medicine

## 2015-05-30 NOTE — Telephone Encounter (Signed)
Sending [place] letter..Carolin Coy

## 2015-05-31 ENCOUNTER — Encounter: Payer: Self-pay | Admitting: Internal Medicine

## 2015-06-01 ENCOUNTER — Other Ambulatory Visit: Payer: Self-pay | Admitting: Internal Medicine

## 2015-06-05 ENCOUNTER — Telehealth: Payer: Self-pay | Admitting: Internal Medicine

## 2015-06-05 DIAGNOSIS — I10 Essential (primary) hypertension: Secondary | ICD-10-CM

## 2015-06-05 DIAGNOSIS — E785 Hyperlipidemia, unspecified: Secondary | ICD-10-CM

## 2015-06-05 DIAGNOSIS — E119 Type 2 diabetes mellitus without complications: Secondary | ICD-10-CM

## 2015-06-05 DIAGNOSIS — E039 Hypothyroidism, unspecified: Secondary | ICD-10-CM

## 2015-06-05 NOTE — Telephone Encounter (Signed)
Patient has a lab appt for 07/02/2015 but needs an order.

## 2015-06-05 NOTE — Telephone Encounter (Signed)
Lab order placed.

## 2015-06-06 ENCOUNTER — Other Ambulatory Visit: Payer: Self-pay | Admitting: Internal Medicine

## 2015-06-13 ENCOUNTER — Other Ambulatory Visit: Payer: Self-pay | Admitting: *Deleted

## 2015-06-13 DIAGNOSIS — E119 Type 2 diabetes mellitus without complications: Secondary | ICD-10-CM

## 2015-06-13 MED ORDER — LISINOPRIL 10 MG PO TABS: ORAL_TABLET | ORAL | Status: DC

## 2015-06-13 MED ORDER — METOPROLOL TARTRATE 25 MG PO TABS: 25.0000 mg | ORAL_TABLET | Freq: Two times a day (BID) | ORAL | Status: DC

## 2015-06-13 MED ORDER — GLIPIZIDE ER 5 MG PO TB24: ORAL_TABLET | ORAL | Status: DC

## 2015-06-13 MED ORDER — OMEPRAZOLE 20 MG PO CPDR: 20.0000 mg | DELAYED_RELEASE_CAPSULE | Freq: Every day | ORAL | Status: DC

## 2015-06-13 MED ORDER — ONETOUCH DELICA LANCETS 33G MISC: 1.0000 | Freq: Two times a day (BID) | Status: DC

## 2015-06-13 MED ORDER — LEVOTHYROXINE SODIUM 125 MCG PO TABS: ORAL_TABLET | ORAL | Status: DC

## 2015-06-13 NOTE — Telephone Encounter (Signed)
[facility] 

## 2015-06-15 ENCOUNTER — Telehealth: Payer: Self-pay | Admitting: *Deleted

## 2015-06-15 ENCOUNTER — Other Ambulatory Visit: Payer: Self-pay | Admitting: *Deleted

## 2015-06-15 MED ORDER — OMEPRAZOLE 20 MG PO CPDR: 20.0000 mg | DELAYED_RELEASE_CAPSULE | Freq: Every day | ORAL | Status: DC

## 2015-06-15 MED ORDER — LORAZEPAM 0.5 MG PO TABS: ORAL_TABLET | ORAL | Status: DC

## 2015-06-15 MED ORDER — CRESTOR 40 MG PO TABS: 40.0000 mg | ORAL_TABLET | Freq: Every day | ORAL | Status: DC

## 2015-06-15 NOTE — Telephone Encounter (Signed)
Ok for lipitor 80mg  #90 take 1 po qhs with 3RF. D/c crestor

## 2015-06-15 NOTE — Telephone Encounter (Signed)
[facility] 

## 2015-06-15 NOTE — Telephone Encounter (Signed)
Patient called and wanted to know if you could switch her cholesterol medication Crestor to Atrovastatin due to it being cheaper. Needs faxed to [facility]. Please Advise.

## 2015-06-18 MED ORDER — ATORVASTATIN CALCIUM 80 MG PO TABS: ORAL_TABLET | ORAL | Status: DC

## 2015-06-18 NOTE — Telephone Encounter (Signed)
Medication List updated and Rx faxed to [facility]. Patient notified and agreed.

## 2015-07-02 ENCOUNTER — Other Ambulatory Visit: Payer: Self-pay | Admitting: *Deleted

## 2015-07-02 ENCOUNTER — Other Ambulatory Visit: Payer: Medicare Other

## 2015-07-02 DIAGNOSIS — I10 Essential (primary) hypertension: Secondary | ICD-10-CM | POA: Diagnosis not present

## 2015-07-02 DIAGNOSIS — E119 Type 2 diabetes mellitus without complications: Secondary | ICD-10-CM | POA: Diagnosis not present

## 2015-07-02 DIAGNOSIS — E785 Hyperlipidemia, unspecified: Secondary | ICD-10-CM

## 2015-07-02 DIAGNOSIS — E039 Hypothyroidism, unspecified: Secondary | ICD-10-CM | POA: Diagnosis not present

## 2015-07-02 MED ORDER — GLUCOSE BLOOD VI STRP: ORAL_STRIP | Status: DC

## 2015-07-02 NOTE — Telephone Encounter (Signed)
[facility] 

## 2015-07-03 LAB — COMPREHENSIVE METABOLIC PANEL
ALT: 12 IU/L (ref 0–32)
AST: 23 IU/L (ref 0–40)
Albumin/Globulin Ratio: 1.6 (ref 1.2–2.2)
Albumin: 4.2 g/dL (ref 3.6–4.8)
Alkaline Phosphatase: 85 IU/L (ref 39–117)
BUN / CREAT RATIO: 20 (ref 11–26)
BUN: 14 mg/dL (ref 8–27)
CHLORIDE: 98 mmol/L (ref 96–106)
CO2: 24 mmol/L (ref 18–29)
Calcium: 9.4 mg/dL (ref 8.7–10.3)
Creatinine, Ser: 0.7 mg/dL (ref 0.57–1.00)
GFR calc Af Amer: 109 mL/min/{1.73_m2} (ref >59)
GFR calc non Af Amer: 94 mL/min/{1.73_m2} (ref >59)
Globulin, Total: 2.6 g/dL (ref 1.5–4.5)
Glucose: 95 mg/dL (ref 65–99)
Potassium: 4.3 mmol/L (ref 3.5–5.2)
Sodium: 140 mmol/L (ref 134–144)
Total Protein: 6.8 g/dL (ref 6.0–8.5)

## 2015-07-03 LAB — LIPID PANEL
Chol/HDL Ratio: 2.4 ratio units (ref 0.0–4.4)
Cholesterol, Total: 142 mg/dL (ref 100–199)
HDL: 58 mg/dL (ref >39)
LDL Calculated: 64 mg/dL (ref 0–99)
Triglycerides: 99 mg/dL (ref 0–149)
VLDL Cholesterol Cal: 20 mg/dL (ref 5–40)

## 2015-07-03 LAB — CBC WITH DIFFERENTIAL/PLATELET
Basophils Absolute: 0 10*3/uL (ref 0.0–0.2)
Basos: 0 %
EOS (ABSOLUTE): 0.4 10*3/uL (ref 0.0–0.4)
EOS: 5 %
HEMATOCRIT: 43.2 % (ref 34.0–46.6)
Hemoglobin: 14.1 g/dL (ref 11.1–15.9)
Immature Grans (Abs): 0 10*3/uL (ref 0.0–0.1)
Immature Granulocytes: 0 %
Lymphocytes Absolute: 2.1 10*3/uL (ref 0.7–3.1)
Lymphs: 24 %
MCH: 28.8 pg (ref 26.6–33.0)
MCHC: 32.6 g/dL (ref 31.5–35.7)
MCV: 88 fL (ref 79–97)
Monocytes Absolute: 0.7 10*3/uL (ref 0.1–0.9)
Monocytes: 8 %
NEUTROS PCT: 63 %
Neutrophils Absolute: 5.5 10*3/uL (ref 1.4–7.0)
PLATELETS: 260 10*3/uL (ref 150–379)
RBC: 4.9 x10E6/uL (ref 3.77–5.28)
RDW: 14.1 % (ref 12.3–15.4)
WBC: 8.7 10*3/uL (ref 3.4–10.8)

## 2015-07-03 LAB — HEMOGLOBIN A1C
ESTIMATED AVERAGE GLUCOSE: 128 mg/dL
Hgb A1c MFr Bld: 6.1 % — ABNORMAL HIGH (ref 4.8–5.6)

## 2015-07-03 LAB — TSH: TSH: 14.88 u[IU]/mL — ABNORMAL HIGH (ref 0.450–4.500)

## 2015-07-04 ENCOUNTER — Encounter: Payer: Self-pay | Admitting: Internal Medicine

## 2015-07-04 ENCOUNTER — Ambulatory Visit (INDEPENDENT_AMBULATORY_CARE_PROVIDER_SITE_OTHER): Payer: Medicare Other | Admitting: Internal Medicine

## 2015-07-04 VITALS — BP 130/100 | HR 86 | Temp 97.4°F | Resp 20 | Ht 63.0 in | Wt 180.4 lb

## 2015-07-04 DIAGNOSIS — L989 Disorder of the skin and subcutaneous tissue, unspecified: Secondary | ICD-10-CM | POA: Diagnosis not present

## 2015-07-04 DIAGNOSIS — E038 Other specified hypothyroidism: Secondary | ICD-10-CM

## 2015-07-04 DIAGNOSIS — E119 Type 2 diabetes mellitus without complications: Secondary | ICD-10-CM | POA: Diagnosis not present

## 2015-07-04 DIAGNOSIS — G4700 Insomnia, unspecified: Secondary | ICD-10-CM

## 2015-07-04 DIAGNOSIS — Z720 Tobacco use: Secondary | ICD-10-CM

## 2015-07-04 DIAGNOSIS — F4320 Adjustment disorder, unspecified: Secondary | ICD-10-CM

## 2015-07-04 DIAGNOSIS — F4321 Adjustment disorder with depressed mood: Secondary | ICD-10-CM | POA: Diagnosis not present

## 2015-07-04 DIAGNOSIS — F322 Major depressive disorder, single episode, severe without psychotic features: Secondary | ICD-10-CM

## 2015-07-04 DIAGNOSIS — I10 Essential (primary) hypertension: Secondary | ICD-10-CM | POA: Diagnosis not present

## 2015-07-04 DIAGNOSIS — E034 Atrophy of thyroid (acquired): Secondary | ICD-10-CM | POA: Diagnosis not present

## 2015-07-04 DIAGNOSIS — E785 Hyperlipidemia, unspecified: Secondary | ICD-10-CM

## 2015-07-04 DIAGNOSIS — K22719 Barrett's esophagus with dysplasia, unspecified: Secondary | ICD-10-CM

## 2015-07-04 DIAGNOSIS — J45909 Unspecified asthma, uncomplicated: Secondary | ICD-10-CM

## 2015-07-04 MED ORDER — TRAZODONE HCL 100 MG PO TABS: 100.0000 mg | ORAL_TABLET | Freq: Every day | ORAL | Status: DC

## 2015-07-04 MED ORDER — LEVOTHYROXINE SODIUM 150 MCG PO TABS: ORAL_TABLET | ORAL | Status: DC

## 2015-07-04 NOTE — Progress Notes (Signed)
Patient ID: Grace [name], female   DOB: [birth year], 60 y.o.   MRN: [id number]    Location:    [facility]   Place of Service:   OFFICE  Chief Complaint  Patient presents with  . Medical Management of Chronic Issues    follow-up     HPI:  60 yo female seen today for f/u. She has cancelled 2 OV and 2 lab appts since her last OV. She reports she has been struggling emotionally since her last OV. She continues to deal with her mother's death and is getting counseling through hospice. She has suicidal thoughts but no HI. No plan and states "I'm too chicken".  She is c/a approx 1 yr hx enlarging brown spot on right arm. No ulceration/bleeding. FHx skin cancer but not melanoma.  She is a poor historian due to depression. Hx obtained from chart  grief reaction - over mother's death in 11/18/2014. (+) insomnia and wakes up every hr. No relief with lorazepam. Takes melatonin 10mg . She takes trazodone for insomnia but wakes up every 2-3 hrs. She has suicidal thoughts. She sees counselor through hospice.  COPD/asthma - no recent exacerbations. She takes flovent, proair.  DM - BS fasting 90-110s, rarely >150. Takes glipizide daily. No low BS reactions. No numbness or tingling. A1c 6.1%. Last eye exam in Dec 2016 at [facility]  HTN - borderline controlled on lopressor and lisinopril. She has not taken her BP meds today yet  Hyperlipidemia - no myalgias on lipitor 80mg . crestor changed due to cost. LDL 64  Hypothyroid - TSH 14.880 on levothyroxine 12mcg. No cold/hot intolerance. She reports soft nails and thinning hair.  GERD/HH/Barrett's esophagus - acid reflux stable. Takes PPI but requires intermittent mylanta or maalox  Tobacco abuse - she is still smoking and has increased amt since mother's death. No quit plans. We discussed dangers of continued tobacco abuse   Past Medical History  Diagnosis Date  . GERD (gastroesophageal reflux disease)   . Hyperlipidemia   . Hypertension   .  Migraine   . COPD (chronic obstructive pulmonary disease) ([place])   . Hypothyroidism     with hx of multinodular goiter (noted on neck US in Nov 18, 2002 - showing diffuse nodularity and inhomogenous texture diffusely BL)  . Asthma   . Hiatal hernia   . Barrett's esophagus     with high grade dysplasia per endoscopy 05/2005 // followed by Dr. [name] ([facility])  . History of CVA (cerebrovascular accident) 10/2000  . Endometrial polyp 03/2003    s/p resection in 03/2003. Path showing submucosal leiomyoma and benign  proliferative type endometrium  . Duodenal ulcer disease     thought to be contributed at least partly by overuse of NSAIDs and salicylates (goody powdr)  . Internal hemorrhoids     grade 2 per colonoscopy in 09/2006 - repeat colonoscopy rec in 5-10 years.  . 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 Fibroid uterus     s/p myomectomy x 2  . Tachycardia   . Nevus   . H/O failed moderate sedation   . Personal history of failed conscious sedation 08/11/2011  . Thyroid disease   . Chronic back pain   . Abnormal vaginal bleeding   . Tobacco abuse Jan 2014    quit    Past Surgical History  Procedure Laterality Date  .  Cholecystectomy    . Myomectomy  1990, 1997    x 2 - In 1997, noted to have extensive pelvic adhesions and BL tubal obstruction  . Colonoscopy  02/07/2010  . Upper gastrointestinal endoscopy  12/18/2009  . Hysteroscopy      Patient Care Team: Gildardo Cranker, DO as PCP - General (Internal Medicine)  Social History   Social History  . Marital Status: Divorced    Spouse Name: N/A  . Number of Children: N/A  . Years of Education: N/A   Occupational History  . Not on file.   Social History Main Topics  . Smoking status: Current Every Day Smoker -- 0.25 packs/day for 42 years    Types: Cigarettes    Last Attempt to Quit: 05/09/2012  . Smokeless tobacco: Never Used      Comment: Counseling sheet [id number]   . Alcohol Use: No  . Drug Use: No  . Sexual Activity: No   Other Topics Concern  . Not on file   Social History Narrative         Financial assistance approved for 100% discount at [facility] and has [facility] card per Bonna Gains   01/15/2010      Diet- N/A   Caffeine- Goody powders   Married-Divorced   [employer] and lives with mother   Pets-2   Current/past profession-[employer]   Exercise-No   Living will-No   DNR-No   POA/HPOA-No              reports that she has been smoking Cigarettes.  She has a 10.5 pack-year smoking history. She has never used smokeless tobacco. She reports that she does not drink alcohol or use illicit drugs.  Allergies  Allergen Reactions  . Codeine     REACTION: headaches  . Prozac [Fluoxetine Hcl] Other (See Comments)    Suicidal ideations    Medications: Patient's Medications  New Prescriptions   No medications on file  Previous Medications   ALBUTEROL (PROAIR HFA) 108 (90 BASE) MCG/ACT INHALER    Inhale 2 puffs into the lungs every 6 (six) hours as needed for wheezing or shortness of breath.   ASPIRIN-ACETAMINOPHEN-CAFFEINE [id number] MG PACK    Take by mouth as needed.   ATORVASTATIN (LIPITOR) 80 MG TABLET    Take one tablet by mouth once daily at bedtime for cholesterol   BIOTIN 1000 MCG TABLET    Take 1,000 mcg by mouth 3 (three) times daily.   DIPHENHYDRAMINE (BENADRYL) 25 MG TABLET    Take 25 mg by mouth daily as needed for sleep.    FLUTICASONE (FLOVENT HFA) 44 MCG/ACT INHALER    INHALE TWO PUFFS INTO LUNGS TWICE DAILY   GLIPIZIDE (GLUCOTROL XL) 5 MG 24 HR TABLET    Take one tablet by mouth once daily to control blood sugar   GLUCOSE BLOOD (ONETOUCH VERIO) TEST STRIP    Use to self monitor blood glucose twice daily and as directed (E11.9)   IBUPROFEN (ADVIL,MOTRIN) 200 MG TABLET    Take 600 mg by mouth as needed for moderate pain.    KETOTIFEN FUMARATE (REFRESH EYE ITCH RELIEF OP)    Place 1 drop into both  eyes daily as needed. For itchy eyes   LEVOTHYROXINE (SYNTHROID, LEVOTHROID) 125 MCG TABLET    Take one tablet by mouth once daily 30 minutes before breakfast for thyroid   LISINOPRIL (PRINIVIL,ZESTRIL) 10 MG TABLET    Take one tablet by mouth once daily for blood pressure   LORAZEPAM (ATIVAN)  0.5 MG TABLET    Take one tablet by mouth three times daily as needed for anxiety or nervousness   MELATONIN 10 MG CAPS    Take by mouth at bedtime.   METOPROLOL TARTRATE (LOPRESSOR) 25 MG TABLET    Take 1 tablet (25 mg total) by mouth 2 (two) times daily.   OMEPRAZOLE (PRILOSEC) 20 MG CAPSULE    Take 1 capsule (20 mg total) by mouth daily.   ONETOUCH DELICA LANCETS [id number] MISC    1 each by Does not apply route 2 (two) times daily.   TRAZODONE (DESYREL) 50 MG TABLET    Take 1 tablet (50 mg total) by mouth at bedtime as needed for sleep.   VALERIAN ROOT PO    Take 1 tablet by mouth at bedtime.   Modified Medications   No medications on file  Discontinued Medications   No medications on file    Review of Systems  Unable to perform ROS: Psychiatric disorder    Filed Vitals:   07/04/15 1122  Pulse: 86  Temp: 97.4 F (36.3 C)  TempSrc: Oral  Resp: 20  Height: 5\' 3"  (1.6 m)  Weight: 180 lb 6.4 oz (81.829 kg)  SpO2: 96%   Body mass index is 31.96 kg/(m^2).  Physical Exam  Constitutional: She appears well-developed and well-nourished.  HENT:  Mouth/Throat: Oropharynx is clear and moist. No oropharyngeal exudate.  Eyes: Pupils are equal, round, and reactive to light. No scleral icterus.  Neck: Neck supple. Carotid bruit is not present. No tracheal deviation present. No thyromegaly present.  Cardiovascular: Normal rate, regular rhythm, normal heart sounds and intact distal pulses.  Exam reveals no gallop and no friction rub.   No murmur heard. No LE edema b/l. no calf TTP.   Pulmonary/Chest: Effort normal and breath sounds normal. No stridor. No respiratory distress. She has no wheezes. She has no  rales.  Reduced BS at base b/l  Abdominal: Soft. Bowel sounds are normal. She exhibits no distension and no mass. There is no hepatomegaly. There is no tenderness. There is no rebound and no guarding.  Lymphadenopathy:    She has no cervical adenopathy.  Neurological: She is alert.  Skin: Skin is warm and dry. Lesion noted. No rash noted.     Psychiatric: Her speech is normal and behavior is normal. Judgment and thought content normal. She exhibits a depressed mood.     Labs reviewed: Appointment on 07/02/2015  Component Date Value Ref Range Status  . Glucose 07/02/2015 95  65 - 99 mg/dL Final  . BUN 07/02/2015 14  8 - 27 mg/dL Final  . Creatinine, Ser 07/02/2015 0.70  0.57 - 1.00 mg/dL Final  . GFR calc non Af Amer 07/02/2015 94  >59 mL/min/1.73 Final  . GFR calc Af Amer 07/02/2015 109  >59 mL/min/1.73 Final  . BUN/Creatinine Ratio 07/02/2015 20  11 - 26 Final  . Sodium 07/02/2015 140  134 - 144 mmol/L Final  . Potassium 07/02/2015 4.3  3.5 - 5.2 mmol/L Final  . Chloride 07/02/2015 98  96 - 106 mmol/L Final  . CO2 07/02/2015 24  18 - 29 mmol/L Final  . Calcium 07/02/2015 9.4  8.7 - 10.3 mg/dL Final  . Total Protein 07/02/2015 6.8  6.0 - 8.5 g/dL Final  . Albumin 07/02/2015 4.2  3.6 - 4.8 g/dL Final  . Globulin, Total 07/02/2015 2.6  1.5 - 4.5 g/dL Final  . Albumin/Globulin Ratio 07/02/2015 1.6  1.2 - 2.2 Final                 **  Please note reference interval change**  . Bilirubin Total 07/02/2015 <0.2  0.0 - 1.2 mg/dL Final  . Alkaline Phosphatase 07/02/2015 85  39 - 117 IU/L Final  . AST 07/02/2015 23  0 - 40 IU/L Final  . ALT 07/02/2015 12  0 - 32 IU/L Final  . Cholesterol, Total 07/02/2015 142  100 - 199 mg/dL Final  . Triglycerides 07/02/2015 99  0 - 149 mg/dL Final  . HDL 07/02/2015 58  >39 mg/dL Final  . VLDL Cholesterol Cal 07/02/2015 20  5 - 40 mg/dL Final  . LDL Calculated 07/02/2015 64  0 - 99 mg/dL Final  . Chol/HDL Ratio 07/02/2015 2.4  0.0 - 4.4 ratio units  Final   Comment:                                   T. Chol/HDL Ratio                                             Men  Women                               1/2 Avg.Risk  3.4    3.3                                   Avg.Risk  5.0    4.4                                2X Avg.Risk  9.6    7.1                                3X Avg.Risk 23.4   11.0   . WBC 07/02/2015 8.7  3.4 - 10.8 x10E3/uL Final  . RBC 07/02/2015 4.90  3.77 - 5.28 x10E6/uL Final  . Hemoglobin 07/02/2015 14.1  11.1 - 15.9 g/dL Final  . Hematocrit 07/02/2015 43.2  34.0 - 46.6 % Final  . MCV 07/02/2015 88  79 - 97 fL Final  . MCH 07/02/2015 28.8  26.6 - 33.0 pg Final  . MCHC 07/02/2015 32.6  31.5 - 35.7 g/dL Final  . RDW 07/02/2015 14.1  12.3 - 15.4 % Final  . Platelets 07/02/2015 260  150 - 379 x10E3/uL Final  . Neutrophils 07/02/2015 63   Final  . Lymphs 07/02/2015 24   Final  . Monocytes 07/02/2015 8   Final  . Eos 07/02/2015 5   Final  . Basos 07/02/2015 0   Final  . Neutrophils Absolute 07/02/2015 5.5  1.4 - 7.0 x10E3/uL Final  . Lymphocytes Absolute 07/02/2015 2.1  0.7 - 3.1 x10E3/uL Final  . Monocytes Absolute 07/02/2015 0.7  0.1 - 0.9 x10E3/uL Final  . EOS (ABSOLUTE) 07/02/2015 0.4  0.0 - 0.4 x10E3/uL Final  . Basophils Absolute 07/02/2015 0.0  0.0 - 0.2 x10E3/uL Final  . Immature Granulocytes 07/02/2015 0   Final  . Immature Grans (Abs) 07/02/2015 0.0  0.0 - 0.1 x10E3/uL Final  . Hgb A1c MFr Bld 07/02/2015 6.1* 4.8 - 5.6 % Final  Comment:          Pre-diabetes: 5.7 - 6.4          Diabetes: >6.4          Glycemic control for adults with diabetes: <7.0   . Est. average glucose Bld gHb Est-m* 07/02/2015 128   Final  . TSH 07/02/2015 14.880* 0.450 - 4.500 uIU/mL Final    No results found.   Assessment/Plan   ICD-9-CM ICD-10-CM   1. Hypothyroidism due to acquired atrophy of thyroid - uncontrolled 244.8 E03.8 levothyroxine (SYNTHROID, LEVOTHROID) 150 MCG tablet   246.8 E03.4 TSH  2. Benign hypertension -  suboptimally controlled. She has not taken dose this AM yet 401.1 I10   3. Severe single current episode of major depressive disorder, without psychotic features ([place]) - uncontrolled 296.23 F32.2   4. Type 2 diabetes mellitus without complication, without long-term current use of insulin ([facility]) - controlled 250.00 E11.9   5. Insomnia - uncontrolled 780.52 G47.00 traZODone (DESYREL) 100 MG tablet  6. Hyperlipidemia 272.4 E78.5   7. Grief reaction - failing to change as expected 309.0 F43.21   8. Asthma, unspecified asthma severity, uncomplicated 123456 [id number]   9. Tobacco abuse - ongoing 305.1 Z72.0   10. Barrett's esophagus with dysplasia 530.85 K22.719   11. Skin lesion of right arm 709.9 L98.9 Ambulatory referral to Dermatology   Increase levothyroxine to 178mcg daily  Increase trazodone 100mg  at bedtime  repeat TSH in 6 weeks  Follow up in 2 mos for depression and thyroid  Continue other medications as ordered  Smoking cessation discussed and highly urged  Galya Dunnigan S. Perlie Gold  [facility] [address] [place], [place] [address] [phone] Cell (Monday-Friday 8 AM - 5 PM) [phone] After 5 PM and follow prompts

## 2015-07-04 NOTE — Patient Instructions (Signed)
Increase levothyroxine to 142mcg daily  Increase trazodone 100mg  at bedtime  repeat TSH in 6 weeks  Follow up in 2 mos for depression and thyroid  Continue other medications as ordered

## 2015-08-15 ENCOUNTER — Other Ambulatory Visit: Payer: Medicare Other

## 2015-08-15 DIAGNOSIS — E038 Other specified hypothyroidism: Secondary | ICD-10-CM | POA: Diagnosis not present

## 2015-08-15 DIAGNOSIS — E034 Atrophy of thyroid (acquired): Secondary | ICD-10-CM | POA: Diagnosis not present

## 2015-08-16 LAB — TSH: TSH: 23.3 u[IU]/mL — ABNORMAL HIGH (ref 0.450–4.500)

## 2015-08-17 ENCOUNTER — Telehealth: Payer: Self-pay

## 2015-08-17 DIAGNOSIS — E039 Hypothyroidism, unspecified: Secondary | ICD-10-CM

## 2015-08-17 MED ORDER — LEVOTHYROXINE SODIUM 175 MCG PO TABS: 175.0000 ug | ORAL_TABLET | Freq: Every day | ORAL | Status: DC

## 2015-08-17 NOTE — Telephone Encounter (Signed)
Discussed lab results with patient, patient already viewed lab results on line. RX sent to [facility] for new dose (per patient request). Future orders placed. Patient would like to recheck at her pending appointment on 09-05-14

## 2015-09-05 ENCOUNTER — Ambulatory Visit (INDEPENDENT_AMBULATORY_CARE_PROVIDER_SITE_OTHER): Payer: Medicare Other | Admitting: Internal Medicine

## 2015-09-05 ENCOUNTER — Encounter: Payer: Self-pay | Admitting: Internal Medicine

## 2015-09-05 VITALS — BP 122/78 | HR 75 | Temp 97.9°F | Resp 14 | Ht 63.0 in | Wt 180.0 lb

## 2015-09-05 DIAGNOSIS — J449 Chronic obstructive pulmonary disease, unspecified: Secondary | ICD-10-CM | POA: Diagnosis not present

## 2015-09-05 DIAGNOSIS — Z720 Tobacco use: Secondary | ICD-10-CM

## 2015-09-05 DIAGNOSIS — F4321 Adjustment disorder with depressed mood: Secondary | ICD-10-CM | POA: Diagnosis not present

## 2015-09-05 MED ORDER — NEBULIZER/TUBING/MOUTHPIECE KIT: PACK | Status: DC

## 2015-09-05 MED ORDER — VARENICLINE TARTRATE 0.5 MG X 11 & 1 MG X 42 PO MISC: ORAL | Status: DC

## 2015-09-05 MED ORDER — ARFORMOTEROL TARTRATE 15 MCG/2ML IN NEBU: 15.0000 ug | INHALATION_SOLUTION | Freq: Two times a day (BID) | RESPIRATORY_TRACT | Status: DC

## 2015-09-05 NOTE — Progress Notes (Signed)
Location:  [facility] of Service: OFFICE  Chief Complaint  Patient presents with  . Medical Management of Chronic Issues    2 month follow-up on depression and thyroid.  Marland Kitchen Shortness of Breath    Discuss order for nebulizer/ use of nebulizer. Patient used one that her mother had and it helped with symptoms  . Nicotine Dependence    Discuss medications for smoking     HPI:  60 yo female seen today for f/u. She is c/a SOB. She would like to try nebulizer tx for COPD. She used deceased mother's nebulizer with significant improvement. She does not require rescue inhaler for at least 6-8 hrs  grief reaction - over mother's death in November 30, 2014. (+) insomnia and wakes up every hr. No relief with lorazepam. Takes melatonin 72m. She takes trazodone for insomnia but wakes up every 2-3 hrs. She has suicidal thoughts. She sees counselor through hospice.  COPD/asthma hx - no recent exacerbations. She takes flovent, proair. [facility]   Hypothyroid - TSH 23.300.  levothyroxine increased to 1735m 2 weeks ago. No cold/hot intolerance. She reports soft nails and thinning hair.  Tobacco abuse - she is still smoking and has increased amt since mother's death. We discussed dangers of continued tobacco . She is ready to quit   Past Medical History  Diagnosis Date  . GERD (gastroesophageal reflux disease)   . Hyperlipidemia   . Hypertension   . Migraine   . COPD (chronic obstructive pulmonary disease) ([place]  . Hypothyroidism     with hx of multinodular goiter (noted on neck [place] 0818-Aug-2004 showing diffuse nodularity and inhomogenous texture diffusely BL)  . Asthma   . Hiatal hernia   . Barrett's esophagus     with high grade dysplasia per endoscopy 05/2005 // followed by [facility]. [facility])  . History of CVA (cerebrovascular accident) 10/2000  . Endometrial polyp 03/2003    s/p resection in 03/2003. Path showing submucosal leiomyoma and  benign  proliferative type endometrium  . Duodenal ulcer disease     thought to be contributed at least partly by overuse of NSAIDs and salicylates (goody powdr)  . Internal hemorrhoids     grade 2 per colonoscopy in 09/2006 - repeat colonoscopy rec in 5-10 years.  . 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 Fibroid uterus     s/p myomectomy x 2  . Tachycardia   . Nevus   . H/O failed moderate sedation   . Personal history of failed conscious sedation 08/11/2011  . Thyroid disease   . Chronic back pain   . Abnormal vaginal bleeding   . Tobacco abuse Jan 2014    quit    Past Surgical History  Procedure Laterality Date  . Cholecystectomy    . Myomectomy  1990, 1997    x 2 - In 1997, noted to have extensive pelvic adhesions and BL tubal obstruction  . Colonoscopy  02/07/2010  . Upper gastrointestinal endoscopy  12/18/2009  . Hysteroscopy      Patient Care Team: MoGildardo CrankerDO as PCP - General (Internal Medicine)  Social History   Social History  . Marital Status: Divorced    Spouse Name: N/A  . Number of Children: N/A  . Years of Education: N/A  Occupational History  . Not on file.   Social History Main Topics  . Smoking status: Current Every Day Smoker -- 1.00 packs/day for 42 years    Types: Cigarettes    Last Attempt to Quit: 05/09/2012  . Smokeless tobacco: Never Used     Comment: Counseling sheet [id number]   . Alcohol Use: No  . Drug Use: No  . Sexual Activity: No   Other Topics Concern  . Not on file   Social History Narrative         Financial assistance approved for 100% discount at [facility] and has [facility] card per Bonna Gains   01/15/2010      Diet- N/A   Caffeine- Goody powders   Married-Divorced   [employer] and lives with mother   Pets-2   Current/past profession-[employer]   Exercise-No   Living will-No   DNR-No   POA/HPOA-No              reports that she  has been smoking Cigarettes.  She has a 42 pack-year smoking history. She has never used smokeless tobacco. She reports that she does not drink alcohol or use illicit drugs.  Allergies  Allergen Reactions  . Codeine     REACTION: headaches  . Prozac [Fluoxetine Hcl] Other (See Comments)    Suicidal ideations    Medications: Patient's Medications  New Prescriptions   No medications on file  Previous Medications   ALBUTEROL (PROAIR HFA) 108 (90 BASE) MCG/ACT INHALER    Inhale 2 puffs into the lungs every 6 (six) hours as needed for wheezing or shortness of breath.   ASPIRIN-ACETAMINOPHEN-CAFFEINE 165-537-48 MG PACK    Take by mouth as needed.   ATORVASTATIN (LIPITOR) 80 MG TABLET    Take one tablet by mouth once daily at bedtime for cholesterol   DIPHENHYDRAMINE (BENADRYL) 25 MG TABLET    Take 25 mg by mouth daily as needed for sleep.    FLUTICASONE (FLOVENT HFA) 44 MCG/ACT INHALER    INHALE TWO PUFFS INTO LUNGS TWICE DAILY   GLIPIZIDE (GLUCOTROL XL) 5 MG 24 HR TABLET    Take one tablet by mouth once daily to control blood sugar   GLUCOSE BLOOD (ONETOUCH VERIO) TEST STRIP    Use to self monitor blood glucose twice daily and as directed (E11.9)   IBUPROFEN (ADVIL,MOTRIN) 200 MG TABLET    Take 600 mg by mouth as needed for moderate pain.    KETOTIFEN FUMARATE (REFRESH EYE ITCH RELIEF OP)    Place 1 drop into both eyes daily as needed. For itchy eyes   LEVOTHYROXINE (SYNTHROID, LEVOTHROID) 175 MCG TABLET    Take 1 tablet (175 mcg total) by mouth daily before breakfast.   LISINOPRIL (PRINIVIL,ZESTRIL) 10 MG TABLET    Take one tablet by mouth once daily for blood pressure   LORAZEPAM (ATIVAN) 0.5 MG TABLET    Take one tablet by mouth three times daily as needed for anxiety or nervousness   MELATONIN 10 MG CAPS    Take by mouth at bedtime.   METOPROLOL TARTRATE (LOPRESSOR) 25 MG TABLET    Take 1 tablet (25 mg total) by mouth 2 (two) times daily.   MULTIPLE VITAMINS-MINERALS (HAIR/SKIN/NAILS)  TABS    Take by mouth daily.   OMEPRAZOLE (PRILOSEC) 20 MG CAPSULE    Take 1 capsule (20 mg total) by mouth daily.   ONETOUCH DELICA LANCETS 27M MISC    1 each by Does not apply route 2 (two) times daily.  TRAZODONE (DESYREL) 100 MG TABLET    Take 1 tablet (100 mg total) by mouth at bedtime.   VALERIAN ROOT PO    Take 1 tablet by mouth at bedtime.   Modified Medications   No medications on file  Discontinued Medications   BIOTIN 1000 MCG TABLET    Take 1,000 mcg by mouth 3 (three) times daily.    Review of Systems  Constitutional: Positive for activity change and fatigue.  Respiratory: Positive for chest tightness and shortness of breath.   Cardiovascular: Negative for chest pain.  Neurological: Positive for weakness.  Psychiatric/Behavioral: Positive for sleep disturbance and dysphoric mood.  All other systems reviewed and are negative.   Filed Vitals:   09/05/15 1346  BP: 122/78  Pulse: 75  Temp: 97.9 F (36.6 C)  TempSrc: Oral  Resp: 14  Height: _0  (1.6 m)  Weight: 180 lb (81.647 kg)  SpO2: 95%   Body mass index is 31.89 kg/(m^2).  Physical Exam  Constitutional: She appears well-developed and well-nourished. No distress.  Cardiovascular: Normal rate, regular rhythm, normal heart sounds and intact distal pulses.  Exam reveals no gallop and no friction rub.   No murmur heard. Pulmonary/Chest: No stridor. No respiratory distress. She has wheezes (end expiratory). She has no rales.  Musculoskeletal: She exhibits edema.  Neurological: She is alert.  Skin: Skin is warm and dry. No rash noted.  Psychiatric: She has a normal mood and affect. Her behavior is normal. Thought content normal.     Labs reviewed: Appointment on 08/15/2015  Component Date Value Ref Range Status  . TSH 08/15/2015 23.300* 0.450 - 4.500 uIU/mL Final  Appointment on 07/02/2015  Component Date Value Ref Range Status  . Glucose 07/02/2015 95  65 - 99 mg/dL Final  . BUN 07/02/2015 14  8 - 27  mg/dL Final  . Creatinine, Ser 07/02/2015 0.70  0.57 - 1.00 mg/dL Final  . GFR calc non Af Amer 07/02/2015 94  >59 mL/min/1.73 Final  . GFR calc Af Amer 07/02/2015 109  >59 mL/min/1.73 Final  . BUN/Creatinine Ratio 07/02/2015 20  11 - 26 Final  . Sodium 07/02/2015 140  134 - 144 mmol/L Final  . Potassium 07/02/2015 4.3  3.5 - 5.2 mmol/L Final  . Chloride 07/02/2015 98  96 - 106 mmol/L Final  . CO2 07/02/2015 24  18 - 29 mmol/L Final  . Calcium 07/02/2015 9.4  8.7 - 10.3 mg/dL Final  . Total Protein 07/02/2015 6.8  6.0 - 8.5 g/dL Final  . Albumin 07/02/2015 4.2  3.6 - 4.8 g/dL Final  . Globulin, Total 07/02/2015 2.6  1.5 - 4.5 g/dL Final  . Albumin/Globulin Ratio 07/02/2015 1.6  1.2 - 2.2 Final                 **Please note reference interval change**  . Bilirubin Total 07/02/2015 <0.2  0.0 - 1.2 mg/dL Final  . Alkaline Phosphatase 07/02/2015 85  39 - 117 IU/L Final  . AST 07/02/2015 23  0 - 40 IU/L Final  . ALT 07/02/2015 12  0 - 32 IU/L Final  . Cholesterol, Total 07/02/2015 142  100 - 199 mg/dL Final  . Triglycerides 07/02/2015 99  0 - 149 mg/dL Final  . HDL 07/02/2015 58  >39 mg/dL Final  . VLDL Cholesterol Cal 07/02/2015 20  5 - 40 mg/dL Final  . LDL Calculated 07/02/2015 64  0 - 99 mg/dL Final  . Chol/HDL Ratio 07/02/2015 2.4  0.0 - 4.4 ratio  units Final   Comment:                                   T. Chol/HDL Ratio                                             Men  Women                               1/2 Avg.Risk  3.4    3.3                                   Avg.Risk  5.0    4.4                                2X Avg.Risk  9.6    7.1                                3X Avg.Risk 23.4   11.0   . WBC 07/02/2015 8.7  3.4 - 10.8 x10E3/uL Final  . RBC 07/02/2015 4.90  3.77 - 5.28 x10E6/uL Final  . Hemoglobin 07/02/2015 14.1  11.1 - 15.9 g/dL Final  . Hematocrit 07/02/2015 43.2  34.0 - 46.6 % Final  . MCV 07/02/2015 88  79 - 97 fL Final  . MCH 07/02/2015 28.8  26.6 - 33.0 pg Final  .  MCHC 07/02/2015 32.6  31.5 - 35.7 g/dL Final  . RDW 07/02/2015 14.1  12.3 - 15.4 % Final  . Platelets 07/02/2015 260  150 - 379 x10E3/uL Final  . Neutrophils 07/02/2015 63   Final  . Lymphs 07/02/2015 24   Final  . Monocytes 07/02/2015 8   Final  . Eos 07/02/2015 5   Final  . Basos 07/02/2015 0   Final  . Neutrophils Absolute 07/02/2015 5.5  1.4 - 7.0 x10E3/uL Final  . Lymphocytes Absolute 07/02/2015 2.1  0.7 - 3.1 x10E3/uL Final  . Monocytes Absolute 07/02/2015 0.7  0.1 - 0.9 x10E3/uL Final  . EOS (ABSOLUTE) 07/02/2015 0.4  0.0 - 0.4 x10E3/uL Final  . Basophils Absolute 07/02/2015 0.0  0.0 - 0.2 x10E3/uL Final  . Immature Granulocytes 07/02/2015 0   Final  . Immature Grans (Abs) 07/02/2015 0.0  0.0 - 0.1 x10E3/uL Final  . Hgb A1c MFr Bld 07/02/2015 6.1* 4.8 - 5.6 % Final   Comment:          Pre-diabetes: 5.7 - 6.4          Diabetes: >6.4          Glycemic control for adults with diabetes: <7.0   . Est. average glucose Bld gHb Est-m* 07/02/2015 128   Final  . TSH 07/02/2015 14.880* 0.450 - 4.500 uIU/mL Final    No results found.   Assessment/Plan   ICD-9-CM ICD-10-CM   1. Chronic obstructive pulmonary disease, unspecified COPD type (HCC) 496 J44.9 arformoterol (BROVANA) 15 MCG/2ML NEBU     Respiratory Therapy Supplies (NEBULIZER/TUBING/MOUTHPIECE) KIT     DISCONTINUED: Respiratory Therapy Supplies (NEBULIZER/TUBING/MOUTHPIECE) KIT  2. Tobacco  abuse 305.1 Z72.0 varenicline (CHANTIX STARTING MONTH PAK) 0.5 MG X 11 & 1 MG X 42 tablet  3. Grief reaction - stable 309.0 F43.21    Start chantix as ordered for smoking cessation  Start brovana 1 vial every 12 hrs for COPD  Continue other medications as ordered  Follow up in 1 month for medication management   Arushi Partridge S. Perlie Gold  [facility] [address] [place], [place] [address] [phone] Cell (Monday-Friday 8 AM - 5 PM) [phone] After 5 PM and follow  prompts

## 2015-09-05 NOTE — Patient Instructions (Addendum)
Start chantix as ordered for smoking cessation  Start brovana 1 vial every 12 hrs for COPD  Continue other medications as ordered  Follow up in 1 month for medication management  Arformoterol nebulizer solution What is this medicine? ARFORMOTEROL (AR for MOE ter ol) is a slow-acting bronchodilator. It helps to open up the airways of your lungs. This medicine is used to treat COPD. It should not be used alone for asthma. Do NOT use for an acute asthma attack. Do NOT use for a COPD attack. This medicine may be used for other purposes; ask your health care provider or pharmacist if you have questions. What should I tell my health care provider before I take this medicine? They need to know if you have any of the following conditions: -diabetes -have asthma and are not taking any other asthma medicine -heart disease or irregular heartbeat -high blood pressure -pheochromocytoma -seizures -thyroid disease -worsening asthma -an unusual or allergic reaction to arformoterol, other medicines, food, dyes, or preservatives -pregnant or trying to get pregnant -breast-feeding How should I use this medicine? This medicine is used in a nebulizer. Nebulizers make a liquid into an aerosol so that you can breathe it in through your mouth and nose into your lungs. You will be taught how to use your nebulizer. Follow the directions on your prescription label. Take your medicine at regular intervals. Do not use more often than directed. A special MedGuide will be given to you by the pharmacist with each prescription and refill. Be sure to read this information carefully each time. Talk to your pediatrician regarding the use of this medicine in children. This medicine is not approved for use in children. Overdosage: If you think you have taken too much of this medicine contact a poison control center or emergency room at once. NOTE: This medicine is only for you. Do not share this medicine with others. What  if I miss a dose? If you miss a dose, take it as soon as you can. If it is almost time for your next dose, take only that dose. Do not take double or extra doses. What may interact with this medicine? Do not take this medicine with any of the following medications: -MAOIs like Carbex, Eldepryl, Marplan, Nardil, and Parnate -procarbazine This medicine may also interact with the following medications: -caffeine -diuretics -formoterol -medicines for colds -medicines for depression, anxiety, or psychotic disturbances -medicines for weight loss including some herbal products -methadone -salmeterol -some antibiotics like clarithromycin, erythromycin, levofloxacin, and linezolid -some heart medicines -steroid hormones like dexamethasone, cortisone, hydrocortisone -theophylline This list may not describe all possible interactions. Give your health care provider a list of all the medicines, herbs, non-prescription drugs, or dietary supplements you use. Also tell them if you smoke, drink alcohol, or use illegal drugs. Some items may interact with your medicine. What should I watch for while using this medicine? Visit your doctor for regular check ups. Tell your doctor or health care professional if your symptoms do not get better. This medicine may increase the possibility of dying from breathing problems. If your symptoms get worse or if you need your short-acting inhalers more often, call your doctor right away. Do not use this medicine more than every 12 hours. NEVER use this medicine for an acute bronchospasm. If you are going to have surgery tell your doctor or health care professional that you are using this medicine. What side effects may I notice from receiving this medicine? Side effects that you should report  to your doctor or health care professional as soon as possible: -allergic reactions such as skin rash or itching, hives, swelling of the face, lips or tongue -chest pain -difficulty  breathing or wheezing that increases or does not go away -dizziness or fainting -fever -high blood pressure -irregular heartbeat -nervousness -tremors -unusually weak or tired Side effects that usually do not require medical attention (report to your doctor or health care professional if they continue or are bothersome): -cough -headache -nausea, vomiting -sore throat -stuffy nose -upset stomach This list may not describe all possible side effects. Call your doctor for medical advice about side effects. You may report side effects to FDA at 1-800-FDA-1088. Where should I keep my medicine? Keep out of the reach of children. Ideally, store in the refrigerator between 2 and 8 degrees C (36 and 46 degrees F) before use. Keep this medicine in the foil pouches until you are ready to use. The medicine should be clear. Do not use any discolored solution. You may store this medicine at room temperature between 20 and 25 degrees C (68 and 77 degrees F) for up to 6 weeks. Throw away any unused medicine after the expiration date. NOTE: This sheet is a summary. It may not cover all possible information. If you have questions about this medicine, talk to your doctor, pharmacist, or health care provider.    2016, Elsevier/Gold Standard. (2008-11-02 21:53:44)   Varenicline oral tablets What is this medicine? VARENICLINE (var EN i kleen) is used to help people quit smoking. It can reduce the symptoms caused by stopping smoking. It is used with a patient support program recommended by your physician. This medicine may be used for other purposes; ask your health care provider or pharmacist if you have questions. What should I tell my health care provider before I take this medicine? They need to know if you have any of these conditions: -bipolar disorder, depression, schizophrenia or other mental illness -heart disease -if you often drink alcohol -kidney disease -peripheral vascular  disease -seizures -stroke -suicidal thoughts, plans, or attempt; a previous suicide attempt by you or a family member -an unusual or allergic reaction to varenicline, other medicines, foods, dyes, or preservatives -pregnant or trying to get pregnant -breast-feeding How should I use this medicine? Take this medicine by mouth after eating. Take with a full glass of water. Follow the directions on the prescription label. Take your doses at regular intervals. Do not take your medicine more often than directed. There are 3 ways you can use this medicine to help you quit smoking; talk to your health care professional to decide which plan is right for you: 1) you can choose a quit date and start this medicine 1 week before the quit date, or, 2) you can start taking this medicine before you choose a quit date, and then pick a quit date between day 8 and 35 days of treatment, or, 3) if you are not sure that you are able or willing to quit smoking right away, start taking this medicine and slowly decrease the amount you smoke as directed by your health care professional with the goal of being cigarette-free by week 12 of treatment. Stick to your plan; ask about support groups or other ways to help you remain cigarette-free. If you are motivated to quit smoking and did not succeed during a previous attempt with this medicine for reasons other than side effects, or if you returned to smoking after this treatment, speak with your health care  professional about whether another course of this medicine may be right for you. A special MedGuide will be given to you by the pharmacist with each prescription and refill. Be sure to read this information carefully each time. Talk to your pediatrician regarding the use of this medicine in children. This medicine is not approved for use in children. Overdosage: If you think you have taken too much of this medicine contact a poison control center or emergency room at  once. NOTE: This medicine is only for you. Do not share this medicine with others. What if I miss a dose? If you miss a dose, take it as soon as you can. If it is almost time for your next dose, take only that dose. Do not take double or extra doses. What may interact with this medicine? -alcohol or any product that contains alcohol -insulin -other stop smoking aids -theophylline -warfarin This list may not describe all possible interactions. Give your health care provider a list of all the medicines, herbs, non-prescription drugs, or dietary supplements you use. Also tell them if you smoke, drink alcohol, or use illegal drugs. Some items may interact with your medicine. What should I watch for while using this medicine? Visit your doctor or health care professional for regular check ups. Ask for ongoing advice and encouragement from your doctor or healthcare professional, friends, and family to help you quit. If you smoke while on this medication, quit again Your mouth may get dry. Chewing sugarless gum or sucking hard candy, and drinking plenty of water may help. Contact your doctor if the problem does not go away or is severe. You may get drowsy or dizzy. Do not drive, use machinery, or do anything that needs mental alertness until you know how this medicine affects you. Do not stand or sit up quickly, especially if you are an older patient. This reduces the risk of dizzy or fainting spells. Sleepwalking can happen during treatment with this medicine, and can sometimes lead to behavior that is harmful to you, other people, or property. Stop taking this medicine and tell your doctor if you start sleepwalking or have other unusual sleep-related activity. Decrease the amount of alcoholic beverages that you drink during treatment with this medicine until you know if this medicine affects your ability to tolerate alcohol. Some people have experienced increased drunkenness (intoxication), unusual or  sometimes aggressive behavior, or no memory of things that have happened (amnesia) during treatment with this medicine. The use of this medicine may increase the chance of suicidal thoughts or actions. Pay special attention to how you are responding while on this medicine. Any worsening of mood, or thoughts of suicide or dying should be reported to your health care professional right away. What side effects may I notice from receiving this medicine? Side effects that you should report to your doctor or health care professional as soon as possible: -allergic reactions like skin rash, itching or hives, swelling of the face, lips, tongue, or throat -acting aggressive, being angry or violent, or acting on dangerous impulses -breathing problems -changes in vision -chest pain or chest tightness -confusion, trouble speaking or understanding -new or worsening depression, anxiety, or panic attacks -extreme increase in activity and talking (mania) -fast, irregular heartbeat -feeling faint or lightheaded, falls -fever -pain in legs when walking -problems with balance, talking, walking -redness, blistering, peeling or loosening of the skin, including inside the mouth -ringing in ears -seeing or hearing things that aren't there (hallucinations) -seizures -sleepwalking -sudden numbness or  weakness of the face, arm or leg -thoughts about suicide or dying, or attempts to commit suicide -trouble passing urine or change in the amount of urine -unusual bleeding or bruising -unusually weak or tired Side effects that usually do not require medical attention (report to your doctor or health care professional if they continue or are bothersome): -constipation -headache -nausea, vomiting -strange dreams -stomach gas -trouble sleeping This list may not describe all possible side effects. Call your doctor for medical advice about side effects. You may report side effects to FDA at 1-800-FDA-1088. Where  should I keep my medicine? Keep out of the reach of children. Store at room temperature between 15 and 30 degrees C (59 and 86 degrees F). Throw away any unused medicine after the expiration date. NOTE: This sheet is a summary. It may not cover all possible information. If you have questions about this medicine, talk to your doctor, pharmacist, or health care provider.    2016, Elsevier/Gold Standard. (2014-12-14 16:14:23)

## 2015-09-11 ENCOUNTER — Telehealth: Payer: Self-pay | Admitting: *Deleted

## 2015-09-11 DIAGNOSIS — J449 Chronic obstructive pulmonary disease, unspecified: Secondary | ICD-10-CM

## 2015-09-11 MED ORDER — ARFORMOTEROL TARTRATE 15 MCG/2ML IN NEBU: 15.0000 ug | INHALATION_SOLUTION | Freq: Two times a day (BID) | RESPIRATORY_TRACT | Status: DC

## 2015-09-11 MED ORDER — NEBULIZER/TUBING/MOUTHPIECE KIT: PACK | Status: DC

## 2015-09-11 NOTE — Telephone Encounter (Signed)
Patient notified and agreed. Faxed Rx to pharmacy.  

## 2015-09-11 NOTE — Telephone Encounter (Signed)
1. Patient called and stated that [facility] stated that they could not use Rx faxed to them for the Nebulizer supplies due to no Dx on Rx. Updated Rx with Dx and faxed to [facility].   2. Patient also stated that the Lamar samples given is not working and would like something else to be called to pharmacy for her nebulizer. Please Advise.

## 2015-09-11 NOTE — Telephone Encounter (Signed)
The samples that were given to her is not helping with her Breathing. She cannot tell a difference after using them. She stated that she doesn't know if it has anything to do with it or not, but they are 2 months expired.

## 2015-09-11 NOTE — Telephone Encounter (Signed)
What does she mean the Garlon Hatchet is not working?

## 2015-09-11 NOTE — Telephone Encounter (Signed)
Can we send her a rx to the pharmacy for brovana #20 vials no RF? Have pt try med again and call office in 2 days if feeling no better

## 2015-09-12 DIAGNOSIS — J449 Chronic obstructive pulmonary disease, unspecified: Secondary | ICD-10-CM | POA: Diagnosis not present

## 2015-09-12 NOTE — Telephone Encounter (Signed)
Patient called and stated that the Brovana cost $176.20 and she cannot afford this and would like something else called in. Please Advise.

## 2015-09-12 NOTE — Telephone Encounter (Signed)
Unfortunately, most of the pulmonary medications are going to be expensive. Do we have any samples of anora ellipta 62.5/25 to give her? She may inhale 1 puff daily

## 2015-09-14 MED ORDER — UMECLIDINIUM-VILANTEROL 62.5-25 MCG/INH IN AEPB: 1.0000 | INHALATION_SPRAY | Freq: Every day | RESPIRATORY_TRACT | Status: DC

## 2015-09-14 NOTE — Addendum Note (Signed)
Addended by: Rafael Bihari A on: 09/14/2015 10:58 AM   Modules accepted: Orders, Medications

## 2015-09-14 NOTE — Telephone Encounter (Signed)
Patient notified and agreed. Samples left up front for pick up. Medication list updated.

## 2015-09-14 NOTE — Telephone Encounter (Signed)
LMOM to return call.

## 2015-09-20 ENCOUNTER — Telehealth: Payer: Self-pay

## 2015-09-20 NOTE — Telephone Encounter (Signed)
Patient called to say she still had samples of inhaler but she is looking into ways of getting medication cheaper because she can't afford to pay for it and she will call the office when she is ready for her refills.

## 2015-09-23 ENCOUNTER — Inpatient Hospital Stay (HOSPITAL_COMMUNITY)
Admission: EM | Admit: 2015-09-23 | Discharge: 2015-10-01 | DRG: 338 | Disposition: A | Discharge status: Home Health | Payer: Medicare Other | Attending: General Surgery | Admitting: General Surgery

## 2015-09-23 ENCOUNTER — Encounter (HOSPITAL_COMMUNITY): Payer: Self-pay | Admitting: Physical Medicine and Rehabilitation

## 2015-09-23 ENCOUNTER — Emergency Department (HOSPITAL_COMMUNITY): Payer: Medicare Other

## 2015-09-23 ENCOUNTER — Encounter (HOSPITAL_COMMUNITY): Admission: EM | Disposition: A | Discharge status: Home Health | Payer: Self-pay | Source: Home / Self Care

## 2015-09-23 ENCOUNTER — Emergency Department (HOSPITAL_COMMUNITY): Payer: Medicare Other | Admitting: Anesthesiology

## 2015-09-23 DIAGNOSIS — Y92239 Unspecified place in hospital as the place of occurrence of the external cause: Secondary | ICD-10-CM | POA: Diagnosis not present

## 2015-09-23 DIAGNOSIS — F17200 Nicotine dependence, unspecified, uncomplicated: Secondary | ICD-10-CM | POA: Diagnosis not present

## 2015-09-23 DIAGNOSIS — K353 Acute appendicitis with localized peritonitis, without perforation or gangrene: Secondary | ICD-10-CM

## 2015-09-23 DIAGNOSIS — N289 Disorder of kidney and ureter, unspecified: Secondary | ICD-10-CM | POA: Diagnosis not present

## 2015-09-23 DIAGNOSIS — Z8673 Personal history of transient ischemic attack (TIA), and cerebral infarction without residual deficits: Secondary | ICD-10-CM | POA: Diagnosis not present

## 2015-09-23 DIAGNOSIS — K449 Diaphragmatic hernia without obstruction or gangrene: Secondary | ICD-10-CM | POA: Diagnosis present

## 2015-09-23 DIAGNOSIS — Z7951 Long term (current) use of inhaled steroids: Secondary | ICD-10-CM | POA: Diagnosis not present

## 2015-09-23 DIAGNOSIS — K3532 Acute appendicitis with perforation and localized peritonitis, without abscess: Secondary | ICD-10-CM | POA: Diagnosis present

## 2015-09-23 DIAGNOSIS — E872 Acidosis: Secondary | ICD-10-CM | POA: Diagnosis not present

## 2015-09-23 DIAGNOSIS — Z79899 Other long term (current) drug therapy: Secondary | ICD-10-CM | POA: Diagnosis not present

## 2015-09-23 DIAGNOSIS — J9601 Acute respiratory failure with hypoxia: Secondary | ICD-10-CM

## 2015-09-23 DIAGNOSIS — E876 Hypokalemia: Secondary | ICD-10-CM | POA: Diagnosis not present

## 2015-09-23 DIAGNOSIS — K352 Acute appendicitis with generalized peritonitis: Secondary | ICD-10-CM | POA: Diagnosis not present

## 2015-09-23 DIAGNOSIS — J449 Chronic obstructive pulmonary disease, unspecified: Secondary | ICD-10-CM | POA: Diagnosis not present

## 2015-09-23 DIAGNOSIS — Z885 Allergy status to narcotic agent status: Secondary | ICD-10-CM | POA: Diagnosis not present

## 2015-09-23 DIAGNOSIS — R0602 Shortness of breath: Secondary | ICD-10-CM

## 2015-09-23 DIAGNOSIS — R1031 Right lower quadrant pain: Secondary | ICD-10-CM | POA: Diagnosis not present

## 2015-09-23 DIAGNOSIS — K2270 Barrett's esophagus without dysplasia: Secondary | ICD-10-CM | POA: Diagnosis not present

## 2015-09-23 DIAGNOSIS — J9600 Acute respiratory failure, unspecified whether with hypoxia or hypercapnia: Secondary | ICD-10-CM

## 2015-09-23 DIAGNOSIS — R6520 Severe sepsis without septic shock: Secondary | ICD-10-CM

## 2015-09-23 DIAGNOSIS — J9621 Acute and chronic respiratory failure with hypoxia: Secondary | ICD-10-CM | POA: Diagnosis not present

## 2015-09-23 DIAGNOSIS — E8770 Fluid overload, unspecified: Secondary | ICD-10-CM | POA: Diagnosis not present

## 2015-09-23 DIAGNOSIS — R0902 Hypoxemia: Secondary | ICD-10-CM | POA: Diagnosis not present

## 2015-09-23 DIAGNOSIS — F17210 Nicotine dependence, cigarettes, uncomplicated: Secondary | ICD-10-CM | POA: Diagnosis present

## 2015-09-23 DIAGNOSIS — J9622 Acute and chronic respiratory failure with hypercapnia: Secondary | ICD-10-CM | POA: Diagnosis not present

## 2015-09-23 DIAGNOSIS — Z8249 Family history of ischemic heart disease and other diseases of the circulatory system: Secondary | ICD-10-CM | POA: Diagnosis not present

## 2015-09-23 DIAGNOSIS — G8929 Other chronic pain: Secondary | ICD-10-CM | POA: Diagnosis present

## 2015-09-23 DIAGNOSIS — M549 Dorsalgia, unspecified: Secondary | ICD-10-CM | POA: Diagnosis present

## 2015-09-23 DIAGNOSIS — N179 Acute kidney failure, unspecified: Secondary | ICD-10-CM | POA: Diagnosis not present

## 2015-09-23 DIAGNOSIS — J9811 Atelectasis: Secondary | ICD-10-CM | POA: Diagnosis not present

## 2015-09-23 DIAGNOSIS — R1030 Lower abdominal pain, unspecified: Secondary | ICD-10-CM | POA: Diagnosis not present

## 2015-09-23 DIAGNOSIS — E785 Hyperlipidemia, unspecified: Secondary | ICD-10-CM | POA: Diagnosis present

## 2015-09-23 DIAGNOSIS — Z452 Encounter for adjustment and management of vascular access device: Secondary | ICD-10-CM | POA: Diagnosis not present

## 2015-09-23 DIAGNOSIS — R339 Retention of urine, unspecified: Secondary | ICD-10-CM | POA: Diagnosis not present

## 2015-09-23 DIAGNOSIS — Z888 Allergy status to other drugs, medicaments and biological substances status: Secondary | ICD-10-CM

## 2015-09-23 DIAGNOSIS — E119 Type 2 diabetes mellitus without complications: Secondary | ICD-10-CM | POA: Diagnosis present

## 2015-09-23 DIAGNOSIS — R1111 Vomiting without nausea: Secondary | ICD-10-CM | POA: Diagnosis not present

## 2015-09-23 DIAGNOSIS — R079 Chest pain, unspecified: Secondary | ICD-10-CM

## 2015-09-23 DIAGNOSIS — K219 Gastro-esophageal reflux disease without esophagitis: Secondary | ICD-10-CM | POA: Diagnosis present

## 2015-09-23 DIAGNOSIS — I471 Supraventricular tachycardia: Secondary | ICD-10-CM | POA: Diagnosis not present

## 2015-09-23 DIAGNOSIS — A419 Sepsis, unspecified organism: Secondary | ICD-10-CM

## 2015-09-23 DIAGNOSIS — R000 Tachycardia, unspecified: Secondary | ICD-10-CM | POA: Diagnosis not present

## 2015-09-23 DIAGNOSIS — I10 Essential (primary) hypertension: Secondary | ICD-10-CM | POA: Diagnosis present

## 2015-09-23 DIAGNOSIS — E039 Hypothyroidism, unspecified: Secondary | ICD-10-CM | POA: Diagnosis not present

## 2015-09-23 DIAGNOSIS — Z7984 Long term (current) use of oral hypoglycemic drugs: Secondary | ICD-10-CM

## 2015-09-23 DIAGNOSIS — K3580 Unspecified acute appendicitis: Secondary | ICD-10-CM | POA: Diagnosis not present

## 2015-09-23 DIAGNOSIS — R6521 Severe sepsis with septic shock: Secondary | ICD-10-CM | POA: Diagnosis not present

## 2015-09-23 DIAGNOSIS — T4275XA Adverse effect of unspecified antiepileptic and sedative-hypnotic drugs, initial encounter: Secondary | ICD-10-CM | POA: Diagnosis not present

## 2015-09-23 DIAGNOSIS — Z8711 Personal history of peptic ulcer disease: Secondary | ICD-10-CM

## 2015-09-23 DIAGNOSIS — R0789 Other chest pain: Secondary | ICD-10-CM | POA: Diagnosis not present

## 2015-09-23 DIAGNOSIS — G9340 Encephalopathy, unspecified: Secondary | ICD-10-CM | POA: Diagnosis not present

## 2015-09-23 DIAGNOSIS — Z825 Family history of asthma and other chronic lower respiratory diseases: Secondary | ICD-10-CM

## 2015-09-23 DIAGNOSIS — J9602 Acute respiratory failure with hypercapnia: Secondary | ICD-10-CM | POA: Diagnosis not present

## 2015-09-23 DIAGNOSIS — J9690 Respiratory failure, unspecified, unspecified whether with hypoxia or hypercapnia: Secondary | ICD-10-CM | POA: Diagnosis not present

## 2015-09-23 HISTORY — DX: Type 2 diabetes mellitus without complications: E11.9

## 2015-09-23 HISTORY — PX: LAPAROSCOPIC APPENDECTOMY: SHX408

## 2015-09-23 LAB — CBC WITH DIFFERENTIAL/PLATELET
BASOS ABS: 0 10*3/uL (ref 0.0–0.1)
BASOS PCT: 0 %
EOS PCT: 0 %
Eosinophils Absolute: 0 10*3/uL (ref 0.0–0.7)
HCT: 42.9 % (ref 36.0–46.0)
Hemoglobin: 13.5 g/dL (ref 12.0–15.0)
LYMPHS PCT: 6 %
Lymphs Abs: 1.1 10*3/uL (ref 0.7–4.0)
MCH: 28.4 pg (ref 26.0–34.0)
MCHC: 31.5 g/dL (ref 30.0–36.0)
MCV: 90.1 fL (ref 78.0–100.0)
MONO ABS: 2.1 10*3/uL — AB (ref 0.1–1.0)
Monocytes Relative: 11 %
NEUTROS ABS: 16 10*3/uL — AB (ref 1.7–7.7)
Neutrophils Relative %: 83 %
PLATELETS: 211 10*3/uL (ref 150–400)
RBC: 4.76 MIL/uL (ref 3.87–5.11)
RDW: 14.2 % (ref 11.5–15.5)
WBC: 19.3 10*3/uL — AB (ref 4.0–10.5)

## 2015-09-23 LAB — COMPREHENSIVE METABOLIC PANEL
ALBUMIN: 3 g/dL — AB (ref 3.5–5.0)
ALT: 12 U/L — AB (ref 14–54)
AST: 16 U/L (ref 15–41)
Alkaline Phosphatase: 78 U/L (ref 38–126)
Anion gap: 9 (ref 5–15)
BUN: 25 mg/dL — AB (ref 6–20)
CHLORIDE: 101 mmol/L (ref 101–111)
CO2: 26 mmol/L (ref 22–32)
CREATININE: 1.62 mg/dL — AB (ref 0.44–1.00)
Calcium: 8.9 mg/dL (ref 8.9–10.3)
GFR calc Af Amer: 39 mL/min — ABNORMAL LOW (ref >60)
GFR, EST NON AFRICAN AMERICAN: 33 mL/min — AB (ref >60)
GLUCOSE: 119 mg/dL — AB (ref 65–99)
POTASSIUM: 3.6 mmol/L (ref 3.5–5.1)
Sodium: 136 mmol/L (ref 135–145)
Total Bilirubin: 0.7 mg/dL (ref 0.3–1.2)
Total Protein: 6.1 g/dL — ABNORMAL LOW (ref 6.5–8.1)

## 2015-09-23 LAB — GLUCOSE, CAPILLARY
GLUCOSE-CAPILLARY: 143 mg/dL — AB (ref 65–99)
Glucose-Capillary: 121 mg/dL — ABNORMAL HIGH (ref 65–99)

## 2015-09-23 LAB — I-STAT TROPONIN, ED: Troponin i, poc: 0 ng/mL (ref 0.00–0.08)

## 2015-09-23 LAB — MAGNESIUM: MAGNESIUM: 2 mg/dL (ref 1.7–2.4)

## 2015-09-23 LAB — BRAIN NATRIURETIC PEPTIDE: B NATRIURETIC PEPTIDE 5: 139.8 pg/mL — AB (ref 0.0–100.0)

## 2015-09-23 SURGERY — APPENDECTOMY, LAPAROSCOPIC
Anesthesia: General

## 2015-09-23 MED ORDER — GLYCOPYRROLATE 0.2 MG/ML IV SOSY
PREFILLED_SYRINGE | INTRAVENOUS | Status: AC
  Filled 2015-09-23: qty 3

## 2015-09-23 MED ORDER — FENTANYL CITRATE (PF) 100 MCG/2ML IJ SOLN
INTRAMUSCULAR | Status: AC
  Administered 2015-09-23: 50 ug via INTRAVENOUS
  Filled 2015-09-23: qty 2

## 2015-09-23 MED ORDER — UMECLIDINIUM BROMIDE 62.5 MCG/INH IN AEPB
1.0000 | INHALATION_SPRAY | Freq: Every day | RESPIRATORY_TRACT | Status: DC
  Administered 2015-09-24 – 2015-10-01 (×5): 1 via RESPIRATORY_TRACT
  Filled 2015-09-23 (×2): qty 7

## 2015-09-23 MED ORDER — PIPERACILLIN-TAZOBACTAM 3.375 G IVPB
3.3750 g | Freq: Three times a day (TID) | INTRAVENOUS | Status: DC
  Administered 2015-09-23 – 2015-09-29 (×17): 3.375 g via INTRAVENOUS
  Filled 2015-09-23 (×23): qty 50

## 2015-09-23 MED ORDER — IPRATROPIUM-ALBUTEROL 0.5-2.5 (3) MG/3ML IN SOLN
3.0000 mL | Freq: Once | RESPIRATORY_TRACT | Status: AC
  Administered 2015-09-23: 3 mL via RESPIRATORY_TRACT
  Filled 2015-09-23: qty 3

## 2015-09-23 MED ORDER — ONDANSETRON HCL 4 MG/2ML IJ SOLN
INTRAMUSCULAR | Status: AC
  Filled 2015-09-23: qty 2

## 2015-09-23 MED ORDER — TRAZODONE HCL 100 MG PO TABS
100.0000 mg | ORAL_TABLET | Freq: Every day | ORAL | Status: DC
  Administered 2015-09-25 – 2015-09-30 (×6): 100 mg via ORAL
  Filled 2015-09-23 (×7): qty 1

## 2015-09-23 MED ORDER — SUCCINYLCHOLINE CHLORIDE 200 MG/10ML IV SOSY
PREFILLED_SYRINGE | INTRAVENOUS | Status: AC
  Filled 2015-09-23: qty 10

## 2015-09-23 MED ORDER — NEOSTIGMINE METHYLSULFATE 5 MG/5ML IV SOSY
PREFILLED_SYRINGE | INTRAVENOUS | Status: AC
  Filled 2015-09-23: qty 5

## 2015-09-23 MED ORDER — ALBUTEROL SULFATE (2.5 MG/3ML) 0.083% IN NEBU
INHALATION_SOLUTION | RESPIRATORY_TRACT | Status: AC
  Administered 2015-09-23: 2.5 mg via RESPIRATORY_TRACT
  Filled 2015-09-23: qty 3

## 2015-09-23 MED ORDER — PROMETHAZINE HCL 25 MG/ML IJ SOLN: 6.2500 mg | INTRAMUSCULAR | Status: DC | PRN

## 2015-09-23 MED ORDER — IOPAMIDOL (ISOVUE-300) INJECTION 61%
INTRAVENOUS | Status: AC
  Administered 2015-09-23: 75 mL
  Filled 2015-09-23: qty 75

## 2015-09-23 MED ORDER — GLYCOPYRROLATE 0.2 MG/ML IJ SOLN
INTRAMUSCULAR | Status: DC | PRN
  Administered 2015-09-23 (×2): 0.4 mg via INTRAVENOUS

## 2015-09-23 MED ORDER — FENTANYL CITRATE (PF) 100 MCG/2ML IJ SOLN
INTRAMUSCULAR | Status: AC
  Filled 2015-09-23: qty 2

## 2015-09-23 MED ORDER — ALBUTEROL SULFATE HFA 108 (90 BASE) MCG/ACT IN AERS
INHALATION_SPRAY | RESPIRATORY_TRACT | Status: DC | PRN
  Administered 2015-09-23 (×2): 2 via RESPIRATORY_TRACT

## 2015-09-23 MED ORDER — UMECLIDINIUM-VILANTEROL 62.5-25 MCG/INH IN AEPB: 1.0000 | INHALATION_SPRAY | Freq: Every day | RESPIRATORY_TRACT | Status: DC

## 2015-09-23 MED ORDER — BUPIVACAINE-EPINEPHRINE (PF) 0.25% -1:200000 IJ SOLN
INTRAMUSCULAR | Status: AC
  Filled 2015-09-23: qty 30

## 2015-09-23 MED ORDER — ALBUTEROL SULFATE HFA 108 (90 BASE) MCG/ACT IN AERS: 2.0000 | INHALATION_SPRAY | Freq: Four times a day (QID) | RESPIRATORY_TRACT | Status: DC | PRN

## 2015-09-23 MED ORDER — DIATRIZOATE MEGLUMINE & SODIUM 66-10 % PO SOLN
ORAL | Status: AC
  Filled 2015-09-23: qty 30

## 2015-09-23 MED ORDER — LISINOPRIL 10 MG PO TABS
10.0000 mg | ORAL_TABLET | Freq: Every day | ORAL | Status: DC
  Administered 2015-09-24: 10 mg via ORAL
  Filled 2015-09-23: qty 1

## 2015-09-23 MED ORDER — FENTANYL CITRATE (PF) 100 MCG/2ML IJ SOLN
50.0000 ug | INTRAMUSCULAR | Status: DC | PRN
  Administered 2015-09-23: 50 ug via INTRAVENOUS
  Filled 2015-09-23: qty 2

## 2015-09-23 MED ORDER — LACTATED RINGERS IV BOLUS (SEPSIS): 1000.0000 mL | Freq: Once | INTRAVENOUS | Status: DC

## 2015-09-23 MED ORDER — KCL IN DEXTROSE-NACL 20-5-0.45 MEQ/L-%-% IV SOLN
INTRAVENOUS | Status: DC
  Administered 2015-09-23 – 2015-09-24 (×2): via INTRAVENOUS
  Filled 2015-09-23 (×4): qty 1000

## 2015-09-23 MED ORDER — LEVOTHYROXINE SODIUM 75 MCG PO TABS
175.0000 ug | ORAL_TABLET | Freq: Every day | ORAL | Status: DC
  Administered 2015-09-24 – 2015-10-01 (×7): 175 ug via ORAL
  Filled 2015-09-23 (×8): qty 1

## 2015-09-23 MED ORDER — ONDANSETRON 4 MG PO TBDP
4.0000 mg | ORAL_TABLET | Freq: Four times a day (QID) | ORAL | Status: DC | PRN
  Filled 2015-09-23: qty 1

## 2015-09-23 MED ORDER — PROPOFOL 10 MG/ML IV BOLUS
INTRAVENOUS | Status: AC
  Filled 2015-09-23: qty 20

## 2015-09-23 MED ORDER — NEOSTIGMINE METHYLSULFATE 10 MG/10ML IV SOLN
INTRAVENOUS | Status: DC | PRN
  Administered 2015-09-23 (×2): 2 mg via INTRAVENOUS

## 2015-09-23 MED ORDER — FENTANYL CITRATE (PF) 250 MCG/5ML IJ SOLN
INTRAMUSCULAR | Status: DC | PRN
  Administered 2015-09-23 (×3): 50 ug via INTRAVENOUS
  Administered 2015-09-23: 100 ug via INTRAVENOUS

## 2015-09-23 MED ORDER — INSULIN ASPART 100 UNIT/ML ~~LOC~~ SOLN
0.0000 [IU] | Freq: Three times a day (TID) | SUBCUTANEOUS | Status: DC
  Administered 2015-09-24: 4 [IU] via SUBCUTANEOUS
  Administered 2015-09-24: 3 [IU] via SUBCUTANEOUS
  Administered 2015-09-24: 4 [IU] via SUBCUTANEOUS
  Administered 2015-09-25 (×2): 3 [IU] via SUBCUTANEOUS
  Administered 2015-09-26: 7 [IU] via SUBCUTANEOUS
  Administered 2015-09-26: 4 [IU] via SUBCUTANEOUS
  Administered 2015-09-27: 3 [IU] via SUBCUTANEOUS
  Administered 2015-09-27: 4 [IU] via SUBCUTANEOUS
  Administered 2015-09-27 – 2015-09-28 (×4): 3 [IU] via SUBCUTANEOUS
  Administered 2015-09-29: 2 [IU] via SUBCUTANEOUS
  Administered 2015-09-30: 3 [IU] via SUBCUTANEOUS
  Administered 2015-09-30 (×2): 4 [IU] via SUBCUTANEOUS
  Administered 2015-10-01: 3 [IU] via SUBCUTANEOUS

## 2015-09-23 MED ORDER — HYDROMORPHONE HCL 1 MG/ML IJ SOLN
1.0000 mg | Freq: Once | INTRAMUSCULAR | Status: DC
Start: 2015-09-23 — End: 2015-09-23

## 2015-09-23 MED ORDER — FENTANYL CITRATE (PF) 100 MCG/2ML IJ SOLN
50.0000 ug | INTRAMUSCULAR | Status: DC | PRN
  Administered 2015-09-23 – 2015-09-24 (×4): 50 ug via INTRAVENOUS
  Filled 2015-09-23 (×3): qty 2

## 2015-09-23 MED ORDER — ROCURONIUM BROMIDE 100 MG/10ML IV SOLN
INTRAVENOUS | Status: DC | PRN
  Administered 2015-09-23: 25 mg via INTRAVENOUS

## 2015-09-23 MED ORDER — SODIUM CHLORIDE 0.9 % IV BOLUS (SEPSIS)
1000.0000 mL | Freq: Once | INTRAVENOUS | Status: AC
  Administered 2015-09-23: 1000 mL via INTRAVENOUS

## 2015-09-23 MED ORDER — IPRATROPIUM-ALBUTEROL 0.5-2.5 (3) MG/3ML IN SOLN
3.0000 mL | Freq: Four times a day (QID) | RESPIRATORY_TRACT | Status: DC
  Administered 2015-09-23 – 2015-09-25 (×6): 3 mL via RESPIRATORY_TRACT
  Filled 2015-09-23 (×7): qty 3

## 2015-09-23 MED ORDER — LACTATED RINGERS IV SOLN
INTRAVENOUS | Status: DC | PRN
  Administered 2015-09-23 (×2): via INTRAVENOUS

## 2015-09-23 MED ORDER — BUPIVACAINE-EPINEPHRINE 0.25% -1:200000 IJ SOLN
INTRAMUSCULAR | Status: DC | PRN
  Administered 2015-09-23: 18 mL

## 2015-09-23 MED ORDER — FENTANYL CITRATE (PF) 250 MCG/5ML IJ SOLN
INTRAMUSCULAR | Status: AC
  Filled 2015-09-23: qty 5

## 2015-09-23 MED ORDER — METOPROLOL TARTRATE 25 MG PO TABS
25.0000 mg | ORAL_TABLET | Freq: Two times a day (BID) | ORAL | Status: DC
  Administered 2015-09-24: 25 mg via ORAL
  Filled 2015-09-23 (×2): qty 1

## 2015-09-23 MED ORDER — ACETAMINOPHEN 650 MG RE SUPP: 650.0000 mg | Freq: Four times a day (QID) | RECTAL | Status: DC | PRN

## 2015-09-23 MED ORDER — ALBUTEROL SULFATE (2.5 MG/3ML) 0.083% IN NEBU
2.5000 mg | INHALATION_SOLUTION | Freq: Once | RESPIRATORY_TRACT | Status: AC
  Administered 2015-09-23: 2.5 mg via RESPIRATORY_TRACT

## 2015-09-23 MED ORDER — DEXAMETHASONE SODIUM PHOSPHATE 10 MG/ML IJ SOLN
INTRAMUSCULAR | Status: DC | PRN
  Administered 2015-09-23: 10 mg via INTRAVENOUS

## 2015-09-23 MED ORDER — PHENYLEPHRINE HCL 10 MG/ML IJ SOLN
10.0000 mg | INTRAVENOUS | Status: DC | PRN
  Administered 2015-09-23: 60 ug/min via INTRAVENOUS

## 2015-09-23 MED ORDER — LIDOCAINE 2% (20 MG/ML) 5 ML SYRINGE
INTRAMUSCULAR | Status: AC
  Filled 2015-09-23: qty 5

## 2015-09-23 MED ORDER — ARFORMOTEROL TARTRATE 15 MCG/2ML IN NEBU
15.0000 ug | INHALATION_SOLUTION | Freq: Two times a day (BID) | RESPIRATORY_TRACT | Status: DC
  Administered 2015-09-23 – 2015-10-01 (×16): 15 ug via RESPIRATORY_TRACT
  Filled 2015-09-23 (×16): qty 2

## 2015-09-23 MED ORDER — PHENYLEPHRINE HCL 10 MG/ML IJ SOLN
INTRAMUSCULAR | Status: DC | PRN
  Administered 2015-09-23: 80 ug via INTRAVENOUS

## 2015-09-23 MED ORDER — PROPOFOL 10 MG/ML IV BOLUS
INTRAVENOUS | Status: DC | PRN
  Administered 2015-09-23: 200 mg via INTRAVENOUS

## 2015-09-23 MED ORDER — ALBUTEROL SULFATE HFA 108 (90 BASE) MCG/ACT IN AERS
INHALATION_SPRAY | RESPIRATORY_TRACT | Status: AC
  Filled 2015-09-23: qty 6.7

## 2015-09-23 MED ORDER — LORAZEPAM 0.5 MG PO TABS: 0.5000 mg | ORAL_TABLET | Freq: Three times a day (TID) | ORAL | Status: DC | PRN

## 2015-09-23 MED ORDER — LIDOCAINE HCL (CARDIAC) 20 MG/ML IV SOLN
INTRAVENOUS | Status: DC | PRN
  Administered 2015-09-23: 100 mg via INTRATRACHEAL

## 2015-09-23 MED ORDER — DEXAMETHASONE SODIUM PHOSPHATE 10 MG/ML IJ SOLN
INTRAMUSCULAR | Status: AC
  Filled 2015-09-23: qty 1

## 2015-09-23 MED ORDER — INSULIN ASPART 100 UNIT/ML ~~LOC~~ SOLN: 0.0000 [IU] | Freq: Every day | SUBCUTANEOUS | Status: DC

## 2015-09-23 MED ORDER — ONDANSETRON HCL 4 MG/2ML IJ SOLN
4.0000 mg | INTRAMUSCULAR | Status: DC | PRN
  Administered 2015-09-23: 4 mg via INTRAVENOUS
  Filled 2015-09-23: qty 2

## 2015-09-23 MED ORDER — FUROSEMIDE 10 MG/ML IJ SOLN
INTRAMUSCULAR | Status: AC
  Administered 2015-09-23: 10 mg via INTRAVENOUS
  Filled 2015-09-23: qty 4

## 2015-09-23 MED ORDER — OXYCODONE HCL 5 MG PO TABS
5.0000 mg | ORAL_TABLET | ORAL | Status: DC | PRN
  Administered 2015-09-24: 10 mg via ORAL
  Administered 2015-09-26: 5 mg via ORAL
  Administered 2015-09-26: 10 mg via ORAL
  Administered 2015-09-26 – 2015-09-27 (×3): 5 mg via ORAL
  Administered 2015-09-27: 10 mg via ORAL
  Administered 2015-09-27: 5 mg via ORAL
  Administered 2015-09-28 – 2015-10-01 (×9): 10 mg via ORAL
  Filled 2015-09-23: qty 1
  Filled 2015-09-23 (×3): qty 2
  Filled 2015-09-23: qty 1
  Filled 2015-09-23 (×3): qty 2
  Filled 2015-09-23: qty 1
  Filled 2015-09-23 (×3): qty 2
  Filled 2015-09-23: qty 1
  Filled 2015-09-23 (×2): qty 2
  Filled 2015-09-23: qty 1
  Filled 2015-09-23: qty 2

## 2015-09-23 MED ORDER — PIPERACILLIN-TAZOBACTAM 3.375 G IVPB 30 MIN
3.3750 g | Freq: Once | INTRAVENOUS | Status: AC
  Administered 2015-09-23: 3.375 g via INTRAVENOUS
  Filled 2015-09-23: qty 50

## 2015-09-23 MED ORDER — SODIUM CHLORIDE 0.9 % IR SOLN
Status: DC | PRN
  Administered 2015-09-23: 2000 mL

## 2015-09-23 MED ORDER — BUDESONIDE 0.25 MG/2ML IN SUSP
0.2500 mg | Freq: Two times a day (BID) | RESPIRATORY_TRACT | Status: DC
  Administered 2015-09-23 (×2): 0.25 mg via RESPIRATORY_TRACT
  Filled 2015-09-23: qty 2

## 2015-09-23 MED ORDER — ACETAMINOPHEN 325 MG PO TABS: 650.0000 mg | ORAL_TABLET | Freq: Four times a day (QID) | ORAL | Status: DC | PRN

## 2015-09-23 MED ORDER — FENTANYL CITRATE (PF) 100 MCG/2ML IJ SOLN
25.0000 ug | INTRAMUSCULAR | Status: DC | PRN
  Administered 2015-09-23 (×3): 50 ug via INTRAVENOUS

## 2015-09-23 MED ORDER — SUCCINYLCHOLINE CHLORIDE 20 MG/ML IJ SOLN
INTRAMUSCULAR | Status: DC | PRN
  Administered 2015-09-23: 120 mg via INTRAVENOUS

## 2015-09-23 MED ORDER — METHOCARBAMOL 500 MG PO TABS
500.0000 mg | ORAL_TABLET | Freq: Four times a day (QID) | ORAL | Status: DC | PRN
  Administered 2015-09-27 – 2015-10-01 (×5): 500 mg via ORAL
  Filled 2015-09-23 (×6): qty 1

## 2015-09-23 MED ORDER — ENOXAPARIN SODIUM 30 MG/0.3ML ~~LOC~~ SOLN
30.0000 mg | SUBCUTANEOUS | Status: DC
  Administered 2015-09-24 – 2015-09-28 (×5): 30 mg via SUBCUTANEOUS
  Filled 2015-09-23 (×5): qty 0.3

## 2015-09-23 MED ORDER — ROCURONIUM BROMIDE 50 MG/5ML IV SOLN
INTRAVENOUS | Status: AC
  Filled 2015-09-23: qty 1

## 2015-09-23 MED ORDER — DIPHENHYDRAMINE HCL 25 MG PO TABS: 25.0000 mg | ORAL_TABLET | Freq: Every day | ORAL | Status: DC | PRN

## 2015-09-23 MED ORDER — FUROSEMIDE 10 MG/ML IJ SOLN
10.0000 mg | INTRAMUSCULAR | Status: DC | PRN
  Administered 2015-09-23: 10 mg via INTRAVENOUS

## 2015-09-23 MED ORDER — ONDANSETRON HCL 4 MG/2ML IJ SOLN
INTRAMUSCULAR | Status: DC | PRN
  Administered 2015-09-23: 4 mg via INTRAVENOUS

## 2015-09-23 MED ORDER — ONDANSETRON HCL 4 MG/2ML IJ SOLN: 4.0000 mg | Freq: Four times a day (QID) | INTRAMUSCULAR | Status: DC | PRN

## 2015-09-23 SURGICAL SUPPLY — 48 items
ADH SKN CLS APL DERMABOND .7 (GAUZE/BANDAGES/DRESSINGS) ×1
APPLIER CLIP ROT 10 11.4 M/L (STAPLE)
APR CLP MED LRG 11.4X10 (STAPLE)
BAG SPEC RTRVL LRG 6X4 10 (ENDOMECHANICALS) ×1
BLADE SURG ROTATE 9660 (MISCELLANEOUS) ×2 IMPLANT
CANISTER SUCTION 2500CC (MISCELLANEOUS) ×3 IMPLANT
CHLORAPREP W/TINT 26ML (MISCELLANEOUS) ×3 IMPLANT
CLIP APPLIE ROT 10 11.4 M/L (STAPLE) IMPLANT
COVER SURGICAL LIGHT HANDLE (MISCELLANEOUS) ×3 IMPLANT
CUTTER FLEX LINEAR 45M (STAPLE) ×3 IMPLANT
DERMABOND ADVANCED (GAUZE/BANDAGES/DRESSINGS) ×2
DERMABOND ADVANCED .7 DNX12 (GAUZE/BANDAGES/DRESSINGS) IMPLANT
DRAIN CHANNEL 19F RND (DRAIN) ×2 IMPLANT
ELECT REM PT RETURN 9FT ADLT (ELECTROSURGICAL)
ELECTRODE REM PT RTRN 9FT ADLT (ELECTROSURGICAL) ×1 IMPLANT
EVACUATOR SILICONE 100CC (DRAIN) ×2 IMPLANT
GLOVE BIO SURGEON STRL SZ8 (GLOVE) ×3 IMPLANT
GLOVE BIOGEL PI IND STRL 8 (GLOVE) ×1 IMPLANT
GLOVE BIOGEL PI INDICATOR 8 (GLOVE) ×2
GOWN STRL REUS W/ TWL LRG LVL3 (GOWN DISPOSABLE) ×2 IMPLANT
GOWN STRL REUS W/ TWL XL LVL3 (GOWN DISPOSABLE) ×1 IMPLANT
GOWN STRL REUS W/TWL LRG LVL3 (GOWN DISPOSABLE) ×6
GOWN STRL REUS W/TWL XL LVL3 (GOWN DISPOSABLE) ×3
KIT BASIN OR (CUSTOM PROCEDURE TRAY) ×3 IMPLANT
KIT ROOM TURNOVER OR (KITS) ×3 IMPLANT
LIQUID BAND (GAUZE/BANDAGES/DRESSINGS) ×1 IMPLANT
NEEDLE 22X1 1/2 (OR ONLY) (NEEDLE) ×3 IMPLANT
NS IRRIG 1000ML POUR BTL (IV SOLUTION) ×3 IMPLANT
PAD ARMBOARD 7.5X6 YLW CONV (MISCELLANEOUS) ×6 IMPLANT
POUCH SPECIMEN RETRIEVAL 10MM (ENDOMECHANICALS) ×3 IMPLANT
RELOAD 45 VASCULAR/THIN (ENDOMECHANICALS) ×3 IMPLANT
RELOAD STAPLE 45 2.5 WHT GRN (ENDOMECHANICALS) IMPLANT
RELOAD STAPLE 45 3.5 BLU ETS (ENDOMECHANICALS) IMPLANT
RELOAD STAPLE TA45 3.5 REG BLU (ENDOMECHANICALS) IMPLANT
SCALPEL HARMONIC ACE (MISCELLANEOUS) ×3 IMPLANT
SCISSORS LAP 5X35 DISP (ENDOMECHANICALS) ×2 IMPLANT
SET IRRIG TUBING LAPAROSCOPIC (IRRIGATION / IRRIGATOR) ×3 IMPLANT
SPECIMEN JAR SMALL (MISCELLANEOUS) ×3 IMPLANT
SUT ETHILON 2 0 FS 18 (SUTURE) ×2 IMPLANT
SUT VIC AB 4-0 PS2 27 (SUTURE) ×3 IMPLANT
TOWEL OR 17X24 6PK STRL BLUE (TOWEL DISPOSABLE) ×3 IMPLANT
TOWEL OR 17X26 10 PK STRL BLUE (TOWEL DISPOSABLE) ×3 IMPLANT
TRAY FOLEY CATH 16FR SILVER (SET/KITS/TRAYS/PACK) ×3 IMPLANT
TRAY LAPAROSCOPIC MC (CUSTOM PROCEDURE TRAY) ×3 IMPLANT
TROCAR XCEL 12X100 BLDLESS (ENDOMECHANICALS) ×5 IMPLANT
TROCAR XCEL BLUNT TIP 100MML (ENDOMECHANICALS) ×3 IMPLANT
TROCAR XCEL NON-BLD 5MMX100MML (ENDOMECHANICALS) ×3 IMPLANT
TUBING INSUFFLATION (TUBING) ×3 IMPLANT

## 2015-09-23 NOTE — Progress Notes (Signed)
Pt is sleeping comfortably at this time no distress or complications noted.  

## 2015-09-23 NOTE — Anesthesia Preprocedure Evaluation (Addendum)
Anesthesia Evaluation  Patient identified by MRN, date of birth, ID band Patient awake    Reviewed: Allergy & Precautions, NPO status , Patient's Chart, lab work & pertinent test results  Airway Mallampati: II  TM Distance: >3 FB Neck ROM: Full    Dental  (+) Dental Advisory Given, Chipped   Pulmonary asthma , COPD, Current Smoker,    breath sounds clear to auscultation       Cardiovascular hypertension,  Rhythm:Regular Rate:Normal     Neuro/Psych    GI/Hepatic Neg liver ROS, hiatal hernia, PUD, GERD  ,  Endo/Other  diabetesHypothyroidism   Renal/GU negative Renal ROS     Musculoskeletal   Abdominal   Peds  Hematology   Anesthesia Other Findings   Reproductive/Obstetrics                          Anesthesia Physical Anesthesia Plan  ASA: III  Anesthesia Plan: General   Post-op Pain Management:    Induction: Intravenous  Airway Management Planned: Oral ETT  Additional Equipment:   Intra-op Plan:   Post-operative Plan: Possible Post-op intubation/ventilation  Informed Consent: I have reviewed the patients History and Physical, chart, labs and discussed the procedure including the risks, benefits and alternatives for the proposed anesthesia with the patient or authorized representative who has indicated his/her understanding and acceptance.   Dental advisory given  Plan Discussed with: CRNA and Anesthesiologist  Anesthesia Plan Comments:        Anesthesia Quick Evaluation

## 2015-09-23 NOTE — Op Note (Signed)
09/23/2015  5:13 PM  PATIENT:  Grace [name]  60 y.o. female  PRE-OPERATIVE DIAGNOSIS:  appendicitis  POST-OPERATIVE DIAGNOSIS:  appendicitis- perforated  PROCEDURE:  Procedure(s): APPENDECTOMY LAPAROSCOPIC LAPAROSCOPIC DRAINAGE OF INTRA-ABDOMINAL ABSCESS  SURGEON:  Surgeon(s): Georganna Skeans, MD  ASSISTANTS: none   ANESTHESIA:   local and general  EBL:  Total I/O In: 3000 [I.V.:3000] Out: 10 [Blood:10]  BLOOD ADMINISTERED:none  DRAINS: (1) Jackson-Pratt drain(s) with closed bulb suction in the RLQ   SPECIMEN:  Excision  DISPOSITION OF SPECIMEN:  PATHOLOGY  COUNTS:  YES  DICTATION: .Dragon Dictation Findings: Perforated appendicitis with abscess right lower quadrant  Procedure in detail: [name] is brought for appendectomy. She was identified in the preop holding area. She received Zosyn intravenously. She was brought to the operating room. She was given bronchodilators. General endotracheal anesthesia was administered by the anesthesia staff. Foley catheter was placed by nursing. We did a time out procedure.The infraumbilical region was infiltrated with local. Infraumbilical incision was made. Subcutaneous tissues were dissected down revealing the anterior fascia. This was divided sharply along the midline. Peritoneal cavity was entered under direct vision without complication. A 0 Vicryl pursestring was placed around the fascial opening. Hassan trocar was inserted into the abdomen. The abdomen was insufflated with carbon dioxide in standard fashion. Laparoscopic exploration revealed a lot of omental adhesions to her lower midline area after previous myomectomy. Additional ports were placed under direct vision. A 12 mm right lower quadrant and right mid abdominal port were placed. Next a 5 mm right lateral port was placed.  Local was used at each port site. The Lago port was removed under direct vision. There were no complications from its insertion, however, the bulky  omental adhesions near it rendered it useless. The pursestring suture was tied to close the fascia at that site. Abdomen was done under direct vision. Next, the appendix was identified within an abscess in the right lower quadrant. There was frank perforation with stool and pus. Ms. appendix was divided with the harmonic scalpel achieving excellent hemostasis. The base of the appendix was divided with Endo GIA using a vascular load. There was good staple line closure. The appendix was placed in a bag and removed from the abdomen. It was sent to pathology. The abdomen was copiously irrigated with multiple liters of saline. Irrigation fluid returned fairly clear. Staple line remained intact and there was no bleeding. Appling drain was placed in the pelvis and brought out the right sided port. Nylon was used to secure that. The remainder irrigation was evacuated as possible. Ports were removed under direct vision. Pneumoperitoneum was released. Ports sites were closed with 4-0 Vicryl followed by Dermabond. All counts were correct. She tolerated the procedure well without apparent complication & was taken recovery in stable condition.  PATIENT DISPOSITION:  PACU - hemodynamically stable.   Delay start of Pharmacological VTE agent (>24hrs) due to surgical blood loss or risk of bleeding:  no  Georganna Skeans, MD, MPH, FACS Pager: [phone]  6/11/20175:13 PM

## 2015-09-23 NOTE — Transfer of Care (Signed)
Immediate Anesthesia Transfer of Care Note  Patient: Grace [name]  Procedure(s) Performed: Procedure(s): APPENDECTOMY LAPAROSCOPIC (N/A)  Patient Location: PACU  Anesthesia Type:General  Level of Consciousness: awake  Airway & Oxygen Therapy: Patient Spontanous Breathing, Patient connected to face mask oxygen and dyspneic, increased work of breathing.  Dr. Oletta Lamas in  Post-op Assessment: Report given to RN and Post -op Vital signs reviewed and stable  Post vital signs: Reviewed and stable  Last Vitals:  Filed Vitals:   09/23/15 1500 09/23/15 1515  BP: 119/63 121/80  Pulse: 110 108  Temp:    Resp:  22    Last Pain:  Filed Vitals:   09/23/15 1528  PainSc: 5          Complications: No apparent anesthesia complications

## 2015-09-23 NOTE — Consult Note (Signed)
Name: Grace [name] MRN: [id number] DOB: 1956/03/24    ADMISSION DATE:  09/23/2015 CONSULTATION DATE:  09/23/15  REFERRING MD :  CCS  CHIEF COMPLAINT:  Abdominal pain  BRIEF PATIENT DESCRIPTION:  59F w/ hx COPD s/p laparotomy for acute appendicitis with post-op respiratory insufficiency  SIGNIFICANT EVENTS  BiPAP  STUDIES:  See below  HISTORY OF PRESENT ILLNESS:   Ms. Grace [name] is a 37F with history of COPD, previously followed by [facility] and most recently managed by her PCP, who was noted to have increased work of breathing and wheezing post-extubation after her urgent appendectomy earlier in the day. She is currently on BiPAP.   She reports that she continues to smoke. She was recently tried on Brovana, but could not affort it. Her PCP then gave her a prescription for Anoro and some samples, which is what she was using prior to admission. She has also been on flovent. She has duonebs and albuterol for rescue. She denies baseline cough. Minimal sputum production. She has never had hemoptysis. She denies frequent exacerbations requiring steroids or antibiotics. The interview is limited by her use of BiPAP. She complains that her breathing feels tight and her belly hurts. She denies any history of sleep apnea and cannot recall ever having used CPAP or BiPAP before.   PAST MEDICAL HISTORY :   has a past medical history of GERD (gastroesophageal reflux disease); Hyperlipidemia; Hypertension; Migraine; COPD (chronic obstructive pulmonary disease) ([place]); Hypothyroidism; Asthma; Hiatal hernia; Barrett's esophagus; History of CVA (cerebrovascular accident) (10/2000); Endometrial polyp (03/2003); Duodenal ulcer disease; Internal hemorrhoids; History of pineal cyst; Fibroid uterus; Tachycardia; Nevus; H/O failed moderate sedation; Personal history of failed conscious sedation (08/11/2011); Thyroid disease; Chronic back pain; Abnormal vaginal bleeding; Tobacco abuse (Jan 2014); and Diabetes  mellitus without complication ([place]).  has past surgical history that includes Cholecystectomy; Myomectomy (1990, 1997); Colonoscopy (02/07/2010); Upper gastrointestinal endoscopy (12/18/2009); and Hysteroscopy. Prior to Admission medications   Medication Sig Start Date End Date Taking? Authorizing Provider  albuterol (PROAIR HFA) 108 (90 BASE) MCG/ACT inhaler Inhale 2 puffs into the lungs every 6 (six) hours as needed for wheezing or shortness of breath. 01/12/15  Yes Gildardo Cranker, DO  Aspirin-Acetaminophen-Caffeine [id number] MG PACK Take 1 Package by mouth as needed.    Yes Historical Provider, MD  atorvastatin (LIPITOR) 80 MG tablet Take one tablet by mouth once daily at bedtime for cholesterol 06/18/15  Yes Gildardo Cranker, DO  diphenhydrAMINE (BENADRYL) 25 MG tablet Take 25 mg by mouth daily as needed for sleep.    Yes Historical Provider, MD  fluticasone (FLOVENT HFA) 44 MCG/ACT inhaler INHALE TWO PUFFS INTO LUNGS TWICE DAILY 01/12/15  Yes Gildardo Cranker, DO  glipiZIDE (GLUCOTROL XL) 5 MG 24 hr tablet Take one tablet by mouth once daily to control blood sugar 06/13/15  Yes Gildardo Cranker, DO  ibuprofen (ADVIL,MOTRIN) 200 MG tablet Take 600 mg by mouth as needed for moderate pain.    Yes Historical Provider, MD  Ketotifen Fumarate (REFRESH EYE ITCH RELIEF OP) Place 1 drop into both eyes daily as needed. For itchy eyes   Yes Historical Provider, MD  levothyroxine (SYNTHROID, LEVOTHROID) 175 MCG tablet Take 1 tablet (175 mcg total) by mouth daily before breakfast. 08/17/15  Yes Gildardo Cranker, DO  lisinopril (PRINIVIL,ZESTRIL) 10 MG tablet Take one tablet by mouth once daily for blood pressure 06/13/15  Yes Gildardo Cranker, DO  LORazepam (ATIVAN) 0.5 MG tablet Take one tablet by mouth three times daily as needed for anxiety  or nervousness 06/15/15  Yes Tiffany L Reed, DO  Melatonin 10 MG CAPS Take by mouth at bedtime.   Yes Historical Provider, MD  metoprolol tartrate (LOPRESSOR) 25 MG tablet Take 1 tablet (25 mg  total) by mouth 2 (two) times daily. 06/13/15  Yes Gildardo Cranker, DO  Multiple Vitamins-Minerals (HAIR/SKIN/NAILS) TABS Take 1 tablet by mouth daily.    Yes Historical Provider, MD  omeprazole (PRILOSEC) 20 MG capsule Take 1 capsule (20 mg total) by mouth daily. 06/15/15  Yes Gildardo Cranker, DO  traZODone (DESYREL) 100 MG tablet Take 1 tablet (100 mg total) by mouth at bedtime. 07/04/15  Yes Monica Carter, DO  umeclidinium-vilanterol (ANORO ELLIPTA) 62.5-25 MCG/INH AEPB Inhale 1 puff into the lungs daily. 09/14/15  Yes Monica Carter, DO  VALERIAN ROOT PO Take 1 tablet by mouth at bedtime.    Yes Historical Provider, MD  glucose blood (ONETOUCH VERIO) test strip Use to self monitor blood glucose twice daily and as directed (E11.9) 07/02/15   Gildardo Cranker, DO  [facility] DELICA LANCETS 30Z MISC 1 each by Does not apply route 2 (two) times daily. 06/13/15   Gildardo Cranker, DO  Respiratory Therapy Supplies (NEBULIZER/TUBING/MOUTHPIECE) KIT 1 nebulizer every 12 hours. DX: J44.9 09/11/15   Gildardo Cranker, DO   Allergies  Allergen Reactions  . Codeine     REACTION: headaches  . Prozac [Fluoxetine Hcl] Other (See Comments)    Suicidal ideations    FAMILY HISTORY:  family history includes COPD in her father and mother; Heart disease in her father. SOCIAL HISTORY:  reports that she has been smoking Cigarettes.  She has a 42 pack-year smoking history. She has never used smokeless tobacco. She reports that she does not drink alcohol or use illicit drugs.  REVIEW OF SYSTEMS:   Constitutional: Negative for fever, chills, weight loss, malaise/fatigue and diaphoresis.  HENT: Negative for hearing loss, ear pain, nosebleeds, congestion, sore throat, neck pain, tinnitus and ear discharge.   Eyes: Negative for blurred vision, double vision, photophobia, pain, discharge and redness.  Respiratory: Negative for cough, hemoptysis, sputum production, shortness of breath, wheezing and stridor.   Cardiovascular: Negative for  chest pain, palpitations, orthopnea, claudication, leg swelling and PND.  Gastrointestinal: Negative for heartburn, nausea, vomiting, abdominal pain, diarrhea, constipation, blood in stool and melena.  Genitourinary: Negative for dysuria, urgency, frequency, hematuria and flank pain.  Musculoskeletal: Negative for myalgias, back pain, joint pain and falls.  Skin: Negative for itching and rash.  Neurological: Negative for dizziness, tingling, tremors, sensory change, speech change, focal weakness, seizures, loss of consciousness, weakness and headaches.  Endo/Heme/Allergies: Negative for environmental allergies and polydipsia. Does not bruise/bleed easily.  SUBJECTIVE:   VITAL SIGNS: Temp:  [97.6 F (36.4 C)-98.6 F (37 C)] 98.1 F (36.7 C) (06/11 1945) Pulse Rate:  [102-126] 107 (06/11 1945) Resp:  [15-27] 24 (06/11 1945) BP: (90-136)/(51-84) 118/65 mmHg (06/11 1945) SpO2:  [92 %-100 %] 93 % (06/11 1945) FiO2 (%):  [30 %] 30 % (06/11 1945) Weight:  [81.647 kg (180 lb)-89 kg (196 lb 3.4 oz)] 89 kg (196 lb 3.4 oz) (06/11 1945)  PHYSICAL EXAMINATION:  General Well nourished, well developed, no apparent distress  HEENT No gross abnormalities. Oropharynx not assessed 2/2 BiPAP  Pulmonary Clear to auscultation bilaterally with no wheezes, rales or ronchi. Diminished R base compared to L. BiPAP assisted effort, symmetrical expansion.   Cardiovascular Tachycardic, regular rhythm. S1, s2. No m/r/g. Distal pulses palpable.  Abdomen Soft, diffusely tender, mildly distended, hypoactive bowel sounds,  no palpable organomegaly or masses. JP drain in place with scant serosanguinous drainage.   Musculoskeletal Grossly normal.   Lymphatics No cervical, supraclavicular or axillary adenopathy.   Neurologic Grossly intact. No focal deficits.   Skin/Integuement No rash, no cyanosis, no clubbing.       Recent Labs Lab 09/23/15 1157  NA 136  K 3.6  CL 101  CO2 26  BUN 25*  CREATININE 1.62*    GLUCOSE 119*    Recent Labs Lab 09/23/15 1157  HGB 13.5  HCT 42.9  WBC 19.3*  PLT 211   Ct Abdomen Pelvis W Contrast  09/23/2015  CLINICAL DATA:  Lower abdominal pain and nausea starting Friday night EXAM: CT ABDOMEN AND PELVIS WITH CONTRAST TECHNIQUE: Multidetector CT imaging of the abdomen and pelvis was performed using the standard protocol following bolus administration of intravenous contrast. CONTRAST:  72m ISOVUE-300 IOPAMIDOL (ISOVUE-300) INJECTION 61% COMPARISON:  None FINDINGS: Lower chest:  The lung bases are unremarkable. Hepatobiliary: The patient is status postcholecystectomy. Mild intrahepatic biliary ductal dilatation. No focal hepatic mass. Pancreas: Enhanced pancreas is unremarkable. Spleen: Enhanced spleen is unremarkable. Adrenals/Urinary Tract: Mild thickening of adrenal glands without definite evidence of mass. Mild lobulated renal contour bilaterally. No hydronephrosis or hydroureter. There is a cyst in lower pole of the right kidney measures 1.2 cm. Cyst in lower pole of the left kidney measures 1.5 cm. No hydronephrosis or hydroureter. Delayed renal images shows bilateral renal symmetrical excretion. Bilateral visualized proximal ureter is unremarkable. Stomach/Bowel: No small bowel obstruction. No thickened or dilated small bowel loops. There is inflammatory process in right lower quadrant. There is significant thickening of the appendix up to 2 cm. There is significant periappendiceal stranding and small amount of periappendiceal fluid is noted. At least 2 calcified appendicoliths are noted the largest at the base of the appendix measures 9 mm. Findings are consistent with acute appendicitis. There is no definite evidence of perforation, however micro perforation cannot be excluded. Vascular/Lymphatic: Atherosclerotic calcifications of abdominal aorta and iliac arteries are noted. No aortic aneurysm. Reproductive: Mild enlarged uterus. At least 3 by material fibroids are  noted within uterus the largest in right fundal region measures 5.3 cm. Other: There is no evidence of free abdominal air. Small amount of fluid and stranding of the mesenteric fat is noted in right paracolic gutter. Musculoskeletal: No destructive bony lesions are noted. Degenerative changes lumbar spine. Mild degenerative changes bilateral SI joints. Mild degenerative changes pubic symphysis. IMPRESSION: 1. Findings consistent with acute appendicitis as described above. 2. No hydronephrosis or hydroureter. 3. No small bowel obstruction. 4. No hydronephrosis or hydroureter. 5. Mild enlarged uterus with multiple myometrial fibroids the largest measures 5.3 cm in right fundal region. 6. Degenerative changes lumbar spine. These results were called by telephone at the time of interpretation on 09/23/2015 at 2:46 pm to Dr. DLeo Grosser, who verbally acknowledged these results. Electronically Signed   By: LLahoma CrockerM.D.   On: 09/23/2015 14:48   Dg Abd Acute W/chest  09/23/2015  CLINICAL DATA:  Pt c/o diarrhea/vomiting since Friday. Severe lower abdominal pain today, no appetite. Some mid-chest pain. Smoker 1/2 ppd. Diabetic type 2. Copd. Htn. Hx gallstones. EXAM: DG ABDOMEN ACUTE W/ 1V CHEST COMPARISON:  None. FINDINGS: Frontal view of the chest demonstrates midline trachea. Normal heart size. Atherosclerosis in the transverse aorta. No pleural effusion or pneumothorax. Probable EKG lead artifact projecting over the upper lobes bilaterally. Abdominal films demonstrate minimal small bowel air-fluid levels in the left-sided abdomen.  Borderline mid small bowel distension. Cholecystectomy. Convex left lumbar spine curvature. Distal gas and stool. IMPRESSION: Nonspecific minimal left abdominal air-fluid levels and borderline small bowel distension. Question mild adynamic ileus as can be seen with gastroenteritis. Probable EKG lead artifacts projecting over the upper lungs. Consider repeat radiograph with removal of all  support apparatus. Electronically Signed   By: Abigail Miyamoto M.D.   On: 09/23/2015 12:38   No PFTs in the [facility] system  ASSESSMENT / PLAN:  1. Respiratory insufficiency with increased work of breathing post operatively 2. COPD   Ms. Goyer is a pleasant 29F who is having some difficulty after her appendectomy characterized by increased work of breathing, tightness, reported wheezing (resolved on BiPAP) and intermittent desaturations. She has a history of COPD and active tobacco use. It does not appear that she is a chronic CO2 retainer, however. We do not have available PFTs, though she denies frequent exacerbations. After abdominal surgery, her inspiratory effort will likely be poor and she will likely benefit from BiPAP support at least for the next 12-24h. The stress of surgery may precipitate an exacerbation and triple therapy with LABA/LAMA/ICS is reasonable to try to prevent further respiratory compromise.   Recommendations:  Continue BiPAP overnight; wean as tolerated  Supplemental oxygen as needed to maintain sats >90%  Continue Brovana and Incruse   ADD Pulmicort nebs BID acutely; can likely d/c or change to MDI prior to discharge  DuoNebs q6h prn   Would hold off on systemic steroids for now  Encourage pulmonary toilet and incentive spirometer  OOB and ambulatory ASAP  Avoid volume overload; target euvolemia to slightly net negative  Will likely need assistance from case management to determine an inhaler regimen (LAMA or LAMA/LABA +/- ICS) at discharge  Thank you for involving [place] in the care of this patient. We will continue to follow.   Yisroel Ramming, MD Pulmonary and [place] Pager: [phone]  09/23/2015, 8:05 PM

## 2015-09-23 NOTE — ED Provider Notes (Signed)
CSN: [id number]     Arrival date & time 09/23/15  1122 History   First MD Initiated Contact with Patient 09/23/15 1133     Chief Complaint  Patient presents with  . Chest Pain     (Consider location/radiation/quality/duration/timing/severity/associated sxs/prior Treatment) Patient is a 60 y.o. female presenting with abdominal pain. The history is provided by the patient.  Abdominal Pain Pain location:  RLQ Pain quality: aching, pressure, sharp and throbbing   Pain radiates to:  Does not radiate Pain severity:  Moderate Onset quality:  Gradual Duration:  2 days Timing:  Constant Progression:  Worsening Chronicity:  New Context: previous surgery (myomectomy) and retching   Context: not sick contacts   Context comment:  Chest pain after vomiting Relieved by:  Nothing Worsened by:  Nothing tried Ineffective treatments:  OTC medications Associated symptoms: diarrhea, fatigue, nausea and vomiting   Associated symptoms: no cough, no fever and no shortness of breath   Risk factors: aspirin   Risk factors: not pregnant     Past Medical History  Diagnosis Date  . GERD (gastroesophageal reflux disease)   . Hyperlipidemia   . Hypertension   . Migraine   . COPD (chronic obstructive pulmonary disease) ([place])   . Hypothyroidism     with hx of multinodular goiter (noted on neck US in 11/2002 - showing diffuse nodularity and inhomogenous texture diffusely BL)  . Asthma   . Hiatal hernia   . Barrett's esophagus     with high grade dysplasia per endoscopy 05/2005 // followed by Dr. [name] ([facility])  . History of CVA (cerebrovascular accident) 10/2000  . Endometrial polyp 03/2003    s/p resection in 03/2003. Path showing submucosal leiomyoma and benign  proliferative type endometrium  . Duodenal ulcer disease     thought to be contributed at least partly by overuse of NSAIDs and salicylates (goody powdr)  . Internal hemorrhoids     grade 2 per colonoscopy in 09/2006 - repeat  colonoscopy rec in 5-10 years.  . 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 Fibroid uterus     s/p myomectomy x 2  . Tachycardia   . Nevus   . H/O failed moderate sedation   . Personal history of failed conscious sedation 08/11/2011  . Thyroid disease   . Chronic back pain   . Abnormal vaginal bleeding   . Tobacco abuse Jan 2014    quit  . Diabetes mellitus without complication [facility])    Past Surgical History  Procedure Laterality Date  . Cholecystectomy    . Myomectomy  1990, 1997    x 2 - In 1997, noted to have extensive pelvic adhesions and BL tubal obstruction  . Colonoscopy  02/07/2010  . Upper gastrointestinal endoscopy  12/18/2009  . Hysteroscopy     Family History  Problem Relation Age of Onset  . COPD Mother   . Heart disease Father     had 5 heart attacks, first at age 8s.  Marland Kitchen COPD Father   .      Social History  Substance Use Topics  . Smoking status: Current Every Day Smoker -- 1.00 packs/day for 42 years    Types: Cigarettes    Last Attempt to Quit: 05/09/2012  . Smokeless tobacco: Never Used     Comment: Counseling sheet [id number]   . Alcohol Use: No  OB History    Gravida Para Term Preterm AB TAB SAB Ectopic Multiple Living   0 0 0 0 0 0 0 0 0 0      Review of Systems  Constitutional: Positive for fatigue. Negative for fever.  Respiratory: Negative for cough and shortness of breath.   Gastrointestinal: Positive for nausea, vomiting, abdominal pain and diarrhea.      Allergies  Codeine and Prozac  Home Medications   Prior to Admission medications   Medication Sig Start Date End Date Taking? Authorizing Provider  albuterol (PROAIR HFA) 108 (90 BASE) MCG/ACT inhaler Inhale 2 puffs into the lungs every 6 (six) hours as needed for wheezing or shortness of breath. 01/12/15  Yes Gildardo Cranker, DO  Aspirin-Acetaminophen-Caffeine [phone] MG PACK Take 1 Package by  mouth as needed.    Yes Historical Provider, MD  atorvastatin (LIPITOR) 80 MG tablet Take one tablet by mouth once daily at bedtime for cholesterol 06/18/15  Yes Gildardo Cranker, DO  diphenhydrAMINE (BENADRYL) 25 MG tablet Take 25 mg by mouth daily as needed for sleep.    Yes Historical Provider, MD  fluticasone (FLOVENT HFA) 44 MCG/ACT inhaler INHALE TWO PUFFS INTO LUNGS TWICE DAILY 01/12/15  Yes Gildardo Cranker, DO  glipiZIDE (GLUCOTROL XL) 5 MG 24 hr tablet Take one tablet by mouth once daily to control blood sugar 06/13/15  Yes Gildardo Cranker, DO  ibuprofen (ADVIL,MOTRIN) 200 MG tablet Take 600 mg by mouth as needed for moderate pain.    Yes Historical Provider, MD  Ketotifen Fumarate (REFRESH EYE ITCH RELIEF OP) Place 1 drop into both eyes daily as needed. For itchy eyes   Yes Historical Provider, MD  levothyroxine (SYNTHROID, LEVOTHROID) 175 MCG tablet Take 1 tablet (175 mcg total) by mouth daily before breakfast. 08/17/15  Yes Gildardo Cranker, DO  lisinopril (PRINIVIL,ZESTRIL) 10 MG tablet Take one tablet by mouth once daily for blood pressure 06/13/15  Yes Gildardo Cranker, DO  LORazepam (ATIVAN) 0.5 MG tablet Take one tablet by mouth three times daily as needed for anxiety or nervousness 06/15/15  Yes Tiffany L Reed, DO  Melatonin 10 MG CAPS Take by mouth at bedtime.   Yes Historical Provider, MD  metoprolol tartrate (LOPRESSOR) 25 MG tablet Take 1 tablet (25 mg total) by mouth 2 (two) times daily. 06/13/15  Yes Gildardo Cranker, DO  Multiple Vitamins-Minerals (HAIR/SKIN/NAILS) TABS Take 1 tablet by mouth daily.    Yes Historical Provider, MD  omeprazole (PRILOSEC) 20 MG capsule Take 1 capsule (20 mg total) by mouth daily. 06/15/15  Yes Gildardo Cranker, DO  traZODone (DESYREL) 100 MG tablet Take 1 tablet (100 mg total) by mouth at bedtime. 07/04/15  Yes Monica Carter, DO  umeclidinium-vilanterol (ANORO ELLIPTA) 62.5-25 MCG/INH AEPB Inhale 1 puff into the lungs daily. 09/14/15  Yes Monica Carter, DO  VALERIAN ROOT PO Take 1  tablet by mouth at bedtime.    Yes Historical Provider, MD  glucose blood (ONETOUCH VERIO) test strip Use to self monitor blood glucose twice daily and as directed (E11.9) 07/02/15   Gildardo Cranker, DO  [facility] DELICA LANCETS 35W MISC 1 each by Does not apply route 2 (two) times daily. 06/13/15   Gildardo Cranker, DO  Respiratory Therapy Supplies (NEBULIZER/TUBING/MOUTHPIECE) KIT 1 nebulizer every 12 hours. DX: J44.9 09/11/15   Monica Carter, DO   BP 121/80 mmHg  Pulse 108  Temp(Src) 98.6 F (37 C) (Oral)  Resp 22  Ht 5' 3"  (1.6 m)  Wt 180 lb (81.647 kg)  BMI 31.89  kg/m2  SpO2 98%  LMP 02/27/2014 Physical Exam  Constitutional: She is oriented to person, place, and time. She appears well-developed and well-nourished. She appears ill. No distress.  HENT:  Head: Normocephalic.  Eyes: Conjunctivae are normal.  Neck: Neck supple. No tracheal deviation present.  Cardiovascular: Regular rhythm and normal heart sounds.  Tachycardia present.   Pulmonary/Chest: Effort normal. No respiratory distress. She has wheezes (diffuse expiratory, Pt states is chronic).  Abdominal: Soft. She exhibits no distension. There is tenderness in the right lower quadrant. There is guarding and tenderness at McBurney's point. There is no rigidity and no rebound.  + rovsings  Neurological: She is alert and oriented to person, place, and time.  Skin: Skin is warm and dry.  Psychiatric: She has a normal mood and affect.  Vitals reviewed.   ED Course  Procedures (including critical care time)  CRITICAL CARE Performed by: Leo Grosser Total critical care time: 30 minutes Critical care time was exclusive of separately billable procedures and treating other patients. Critical care was necessary to treat or prevent imminent or life-threatening deterioration. Critical care was time spent personally by me on the following activities: development of treatment plan with patient and/or surrogate as well as nursing, discussions  with consultants, evaluation of patient's response to treatment, examination of patient, obtaining history from patient or surrogate, ordering and performing treatments and interventions, ordering and review of laboratory studies, ordering and review of radiographic studies, pulse oximetry and re-evaluation of patient's condition.  Emergency Focused Ultrasound Exam Limited Ultrasound Assessment for the evaluation of Hypotension (RUSH PROTOCOL)  Performed and interpreted by Dr. Laneta Simmers Indication: Hypotension Multiple images of the bilateral lungs, heart, inferior vena cava, abdomen, and abdominal aorta are obtained for the purposes of estimating presence/absence of pneumothorax, cardiac contractility, volume status, abdominal free fluid and aortic aneurysm with a multifrequency probe. Findings: + B lines and sliding lung, no anechoic fluid in abdomen, nml cardiac contractility, no anechoic fluid surrounding heart, partial IVC collapse, unable to assess aorta 2/2 bowel gas Interpretation: no pneumothorax, no hemoperitoneum, no pericardial effusion, slightly depressed CVP, indeterminate abdominal aortic aneurysm d/t technically limited study Images archived electronically.  CPT Codes: thorax [id number],  cardiac [id number], abdomen [phone], limited retroperitoneal [phone] (study includes all codes)   Labs Review Labs Reviewed  BRAIN NATRIURETIC PEPTIDE - Abnormal; Notable for the following:    B Natriuretic Peptide 139.8 (*)    All other components within normal limits  CBC WITH DIFFERENTIAL/PLATELET - Abnormal; Notable for the following:    WBC 19.3 (*)    Neutro Abs 16.0 (*)    Monocytes Absolute 2.1 (*)    All other components within normal limits  COMPREHENSIVE METABOLIC PANEL - Abnormal; Notable for the following:    Glucose, Bld 119 (*)    BUN 25 (*)    Creatinine, Ser 1.62 (*)    Total Protein 6.1 (*)    Albumin 3.0 (*)    ALT 12 (*)    GFR calc non Af Amer 33 (*)    GFR calc Af Amer 39 (*)     All other components within normal limits  CULTURE, BLOOD (ROUTINE X 2)  CULTURE, BLOOD (ROUTINE X 2)  MAGNESIUM  I-STAT TROPOININ, ED  SURGICAL PATHOLOGY    Imaging Review Ct Abdomen Pelvis W Contrast  09/23/2015  CLINICAL DATA:  Lower abdominal pain and nausea starting Friday night EXAM: CT ABDOMEN AND PELVIS WITH CONTRAST TECHNIQUE: Multidetector CT imaging of the abdomen and pelvis was performed using  the standard protocol following bolus administration of intravenous contrast. CONTRAST:  93m ISOVUE-300 IOPAMIDOL (ISOVUE-300) INJECTION 61% COMPARISON:  None FINDINGS: Lower chest:  The lung bases are unremarkable. Hepatobiliary: The patient is status postcholecystectomy. Mild intrahepatic biliary ductal dilatation. No focal hepatic mass. Pancreas: Enhanced pancreas is unremarkable. Spleen: Enhanced spleen is unremarkable. Adrenals/Urinary Tract: Mild thickening of adrenal glands without definite evidence of mass. Mild lobulated renal contour bilaterally. No hydronephrosis or hydroureter. There is a cyst in lower pole of the right kidney measures 1.2 cm. Cyst in lower pole of the left kidney measures 1.5 cm. No hydronephrosis or hydroureter. Delayed renal images shows bilateral renal symmetrical excretion. Bilateral visualized proximal ureter is unremarkable. Stomach/Bowel: No small bowel obstruction. No thickened or dilated small bowel loops. There is inflammatory process in right lower quadrant. There is significant thickening of the appendix up to 2 cm. There is significant periappendiceal stranding and small amount of periappendiceal fluid is noted. At least 2 calcified appendicoliths are noted the largest at the base of the appendix measures 9 mm. Findings are consistent with acute appendicitis. There is no definite evidence of perforation, however micro perforation cannot be excluded. Vascular/Lymphatic: Atherosclerotic calcifications of abdominal aorta and iliac arteries are noted. No aortic  aneurysm. Reproductive: Mild enlarged uterus. At least 3 by material fibroids are noted within uterus the largest in right fundal region measures 5.3 cm. Other: There is no evidence of free abdominal air. Small amount of fluid and stranding of the mesenteric fat is noted in right paracolic gutter. Musculoskeletal: No destructive bony lesions are noted. Degenerative changes lumbar spine. Mild degenerative changes bilateral SI joints. Mild degenerative changes pubic symphysis. IMPRESSION: 1. Findings consistent with acute appendicitis as described above. 2. No hydronephrosis or hydroureter. 3. No small bowel obstruction. 4. No hydronephrosis or hydroureter. 5. Mild enlarged uterus with multiple myometrial fibroids the largest measures 5.3 cm in right fundal region. 6. Degenerative changes lumbar spine. These results were called by telephone at the time of interpretation on 09/23/2015 at 2:46 pm to Dr. DLeo Grosser, who verbally acknowledged these results. Electronically Signed   By: LLahoma CrockerM.D.   On: 09/23/2015 14:48   Dg Abd Acute W/chest  09/23/2015  CLINICAL DATA:  Pt c/o diarrhea/vomiting since Friday. Severe lower abdominal pain today, no appetite. Some mid-chest pain. Smoker 1/2 ppd. Diabetic type 2. Copd. Htn. Hx gallstones. EXAM: DG ABDOMEN ACUTE W/ 1V CHEST COMPARISON:  None. FINDINGS: Frontal view of the chest demonstrates midline trachea. Normal heart size. Atherosclerosis in the transverse aorta. No pleural effusion or pneumothorax. Probable EKG lead artifact projecting over the upper lobes bilaterally. Abdominal films demonstrate minimal small bowel air-fluid levels in the left-sided abdomen. Borderline mid small bowel distension. Cholecystectomy. Convex left lumbar spine curvature. Distal gas and stool. IMPRESSION: Nonspecific minimal left abdominal air-fluid levels and borderline small bowel distension. Question mild adynamic ileus as can be seen with gastroenteritis. Probable EKG lead artifacts  projecting over the upper lungs. Consider repeat radiograph with removal of all support apparatus. Electronically Signed   By: KAbigail MiyamotoM.D.   On: 09/23/2015 12:38   I have personally reviewed and evaluated these images and lab results as part of my medical decision-making.   EKG Interpretation   Date/Time:  Sunday September 23 2015 11:37:01 EDT Ventricular Rate:  117 PR Interval:  136 QRS Duration: 91 QT Interval:  359 QTC Calculation: 501 R Axis:   75 Text Interpretation:  Sinus rhythm tachycardia Ventricular bigeminy New  since previous  tracing Biatrial enlargement Borderline repolarization  abnormality Confirmed by Danni Shima MD, Jayden Rudge [phone]) on 09/23/2015 12:14:44 PM      MDM   Final diagnoses:  Acute appendicitis with localized peritonitis  Severe sepsis ([place])    60 y.o. female presents with n/v/d over last 2 days. Held her hypertension meds because she was feeling very weak today. Worsening RLQ pain over the last 2 days. Had some chest pain related to vomiting that apears non-cardiac in nature but she is mildly hypotensive on arrival and having frequent ectopy. Troponin negative, doubt ACS or other cardiac etiology with resolution of ectopy and hypotension on fluid resuscitation. Significant abdominal tenderness prompted abdominal CT which identified source of pain and septic appearance. Zosyn and full 30/kg fluid resuscitation performed for empiric coverage perioperatively and general surgery consulted for emergent surgery and admission to hospital.   Patient meets clinical criteria for severe sepsis with at least 2 SIRS, evidence of end organ damage and/or lactic acid elevation and suspected source of appendicitis. Responsive to fluid resuscitation.      Leo Grosser, MD 09/23/15 [phone]

## 2015-09-23 NOTE — ED Notes (Signed)
Pt returned to exam room from CT.

## 2015-09-23 NOTE — ED Notes (Signed)
Lab at bedside

## 2015-09-23 NOTE — Progress Notes (Signed)
Pt transferred from PACU to 3S1. Trip uneventful, pt tolerating NIV well no distress or complications noted.

## 2015-09-23 NOTE — H&P (Signed)
Grace [name] is an 60 y.o. female.   Chief Complaint: Diarrhea and right lower quadrant abdominal pain HPI: Grace [name] has been feeling poorly for 48 hours. She complains of diarrhea with abdominal pain that has worsened in her right lower quadrant. She came to the emergency department for further evaluation. She was found to have leukocytosis of 19,300. CT scan of the abdomen and pelvis showed acute appendicitis. Additionally, she has COPD and some other medical issues as listed below. She claims she thought her appendix had been previously removed during a myomectomy procedure.  Past Medical History  Diagnosis Date  . GERD (gastroesophageal reflux disease)   . Hyperlipidemia   . Hypertension   . Migraine   . COPD (chronic obstructive pulmonary disease) ([place])   . Hypothyroidism     with hx of multinodular goiter (noted on neck US in 11/2002 - showing diffuse nodularity and inhomogenous texture diffusely BL)  . Asthma   . Hiatal hernia   . Barrett's esophagus     with high grade dysplasia per endoscopy 05/2005 // followed by Dr. [name] ([facility])  . History of CVA (cerebrovascular accident) 10/2000  . Endometrial polyp 03/2003    s/p resection in 03/2003. Path showing submucosal leiomyoma and benign  proliferative type endometrium  . Duodenal ulcer disease     thought to be contributed at least partly by overuse of NSAIDs and salicylates (goody powdr)  . Internal hemorrhoids     grade 2 per colonoscopy in 09/2006 - repeat colonoscopy rec in 5-10 years.  . 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 Fibroid uterus     s/p myomectomy x 2  . Tachycardia   . Nevus   . H/O failed moderate sedation   . Personal history of failed conscious sedation 08/11/2011  . Thyroid disease   . Chronic back pain   . Abnormal vaginal bleeding   . Tobacco abuse Jan 2014    quit  . Diabetes mellitus without  complication [facility])     Past Surgical History  Procedure Laterality Date  . Cholecystectomy    . Myomectomy  1990, 1997    x 2 - In 1997, noted to have extensive pelvic adhesions and BL tubal obstruction  . Colonoscopy  02/07/2010  . Upper gastrointestinal endoscopy  12/18/2009  . Hysteroscopy      Family History  Problem Relation Age of Onset  . COPD Mother   . Heart disease Father     had 5 heart attacks, first at age 24s.  Marland Kitchen COPD Father   .      Social History:  reports that she has been smoking Cigarettes.  She has a 42 pack-year smoking history. She has never used smokeless tobacco. She reports that she does not drink alcohol or use illicit drugs.  Allergies:  Allergies  Allergen Reactions  . Codeine     REACTION: headaches  . Prozac [Fluoxetine Hcl] Other (See Comments)    Suicidal ideations     (Not in a hospital admission)  Results for orders placed or performed during the hospital encounter of 09/23/15 (from the past 48 hour(s))  Magnesium     Status: None   Collection Time: 09/23/15 11:57 AM  Result Value Ref Range   Magnesium 2.0 1.7 - 2.4 mg/dL  Brain natriuretic peptide     Status: Abnormal  Collection Time: 09/23/15 11:57 AM  Result Value Ref Range   B Natriuretic Peptide 139.8 (H) 0.0 - 100.0 pg/mL  CBC with Differential     Status: Abnormal   Collection Time: 09/23/15 11:57 AM  Result Value Ref Range   WBC 19.3 (H) 4.0 - 10.5 K/uL   RBC 4.76 3.87 - 5.11 MIL/uL   Hemoglobin 13.5 12.0 - 15.0 g/dL   HCT 42.9 36.0 - 46.0 %   MCV 90.1 78.0 - 100.0 fL   MCH 28.4 26.0 - 34.0 pg   MCHC 31.5 30.0 - 36.0 g/dL   RDW 14.2 11.5 - 15.5 %   Platelets 211 150 - 400 K/uL   Neutrophils Relative % 83 %   Neutro Abs 16.0 (H) 1.7 - 7.7 K/uL   Lymphocytes Relative 6 %   Lymphs Abs 1.1 0.7 - 4.0 K/uL   Monocytes Relative 11 %   Monocytes Absolute 2.1 (H) 0.1 - 1.0 K/uL   Eosinophils Relative 0 %   Eosinophils Absolute 0.0 0.0 - 0.7 K/uL   Basophils Relative 0 %    Basophils Absolute 0.0 0.0 - 0.1 K/uL  Comprehensive metabolic panel     Status: Abnormal   Collection Time: 09/23/15 11:57 AM  Result Value Ref Range   Sodium 136 135 - 145 mmol/L   Potassium 3.6 3.5 - 5.1 mmol/L   Chloride 101 101 - 111 mmol/L   CO2 26 22 - 32 mmol/L   Glucose, Bld 119 (H) 65 - 99 mg/dL   BUN 25 (H) 6 - 20 mg/dL   Creatinine, Ser 1.62 (H) 0.44 - 1.00 mg/dL   Calcium 8.9 8.9 - 10.3 mg/dL   Total Protein 6.1 (L) 6.5 - 8.1 g/dL   Albumin 3.0 (L) 3.5 - 5.0 g/dL   AST 16 15 - 41 U/L   ALT 12 (L) 14 - 54 U/L   Alkaline Phosphatase 78 38 - 126 U/L   Total Bilirubin 0.7 0.3 - 1.2 mg/dL   GFR calc non Af Amer 33 (L) >60 mL/min   GFR calc Af Amer 39 (L) >60 mL/min    Comment: (NOTE) The eGFR has been calculated using the CKD EPI equation. This calculation has not been validated in all clinical situations. eGFR's persistently <60 mL/min signify possible Chronic Kidney Disease.    Anion gap 9 5 - 15  I-Stat Troponin, ED - 0, 3, 6 hours (not at [facility])     Status: None   Collection Time: 09/23/15 12:01 PM  Result Value Ref Range   Troponin i, poc 0.00 0.00 - 0.08 ng/mL   Comment 3            Comment: Due to the release kinetics of cTnI, a negative result within the first hours of the onset of symptoms does not rule out myocardial infarction with certainty. If myocardial infarction is still suspected, repeat the test at appropriate intervals.    Ct Abdomen Pelvis W Contrast  09/23/2015  CLINICAL DATA:  Lower abdominal pain and nausea starting Friday night EXAM: CT ABDOMEN AND PELVIS WITH CONTRAST TECHNIQUE: Multidetector CT imaging of the abdomen and pelvis was performed using the standard protocol following bolus administration of intravenous contrast. CONTRAST:  29m ISOVUE-300 IOPAMIDOL (ISOVUE-300) INJECTION 61% COMPARISON:  None FINDINGS: Lower chest:  The lung bases are unremarkable. Hepatobiliary: The patient is status postcholecystectomy. Mild intrahepatic  biliary ductal dilatation. No focal hepatic mass. Pancreas: Enhanced pancreas is unremarkable. Spleen: Enhanced spleen is unremarkable. Adrenals/Urinary Tract: Mild thickening of  adrenal glands without definite evidence of mass. Mild lobulated renal contour bilaterally. No hydronephrosis or hydroureter. There is a cyst in lower pole of the right kidney measures 1.2 cm. Cyst in lower pole of the left kidney measures 1.5 cm. No hydronephrosis or hydroureter. Delayed renal images shows bilateral renal symmetrical excretion. Bilateral visualized proximal ureter is unremarkable. Stomach/Bowel: No small bowel obstruction. No thickened or dilated small bowel loops. There is inflammatory process in right lower quadrant. There is significant thickening of the appendix up to 2 cm. There is significant periappendiceal stranding and small amount of periappendiceal fluid is noted. At least 2 calcified appendicoliths are noted the largest at the base of the appendix measures 9 mm. Findings are consistent with acute appendicitis. There is no definite evidence of perforation, however micro perforation cannot be excluded. Vascular/Lymphatic: Atherosclerotic calcifications of abdominal aorta and iliac arteries are noted. No aortic aneurysm. Reproductive: Mild enlarged uterus. At least 3 by material fibroids are noted within uterus the largest in right fundal region measures 5.3 cm. Other: There is no evidence of free abdominal air. Small amount of fluid and stranding of the mesenteric fat is noted in right paracolic gutter. Musculoskeletal: No destructive bony lesions are noted. Degenerative changes lumbar spine. Mild degenerative changes bilateral SI joints. Mild degenerative changes pubic symphysis. IMPRESSION: 1. Findings consistent with acute appendicitis as described above. 2. No hydronephrosis or hydroureter. 3. No small bowel obstruction. 4. No hydronephrosis or hydroureter. 5. Mild enlarged uterus with multiple myometrial  fibroids the largest measures 5.3 cm in right fundal region. 6. Degenerative changes lumbar spine. These results were called by telephone at the time of interpretation on 09/23/2015 at 2:46 pm to Dr. Leo Grosser , who verbally acknowledged these results. Electronically Signed   By: Lahoma Crocker M.D.   On: 09/23/2015 14:48   Dg Abd Acute W/chest  09/23/2015  CLINICAL DATA:  Pt c/o diarrhea/vomiting since Friday. Severe lower abdominal pain today, no appetite. Some mid-chest pain. Smoker 1/2 ppd. Diabetic type 2. Copd. Htn. Hx gallstones. EXAM: DG ABDOMEN ACUTE W/ 1V CHEST COMPARISON:  None. FINDINGS: Frontal view of the chest demonstrates midline trachea. Normal heart size. Atherosclerosis in the transverse aorta. No pleural effusion or pneumothorax. Probable EKG lead artifact projecting over the upper lobes bilaterally. Abdominal films demonstrate minimal small bowel air-fluid levels in the left-sided abdomen. Borderline mid small bowel distension. Cholecystectomy. Convex left lumbar spine curvature. Distal gas and stool. IMPRESSION: Nonspecific minimal left abdominal air-fluid levels and borderline small bowel distension. Question mild adynamic ileus as can be seen with gastroenteritis. Probable EKG lead artifacts projecting over the upper lungs. Consider repeat radiograph with removal of all support apparatus. Electronically Signed   By: Abigail Miyamoto M.D.   On: 09/23/2015 12:38    Review of Systems  Constitutional: Positive for malaise/fatigue. Negative for fever.  HENT: Negative.   Eyes: Negative.   Respiratory: Positive for cough and wheezing.   Cardiovascular: Negative for chest pain.  Gastrointestinal: Positive for nausea, abdominal pain and diarrhea. Negative for vomiting.  Genitourinary: Negative.   Musculoskeletal: Negative.   Skin: Negative.   Neurological: Negative.   Endo/Heme/Allergies: Negative.   Psychiatric/Behavioral: Negative.     Blood pressure 124/77, pulse 107, temperature  98.6 F (37 C), temperature source Oral, resp. rate 18, height '5\' 3"'$  (1.6 m), weight 81.647 kg (180 lb), last menstrual period 02/27/2014, SpO2 98 %. Physical Exam  Constitutional: She is oriented to person, place, and time. She appears well-developed and well-nourished. No distress.  HENT:  Head: Normocephalic and atraumatic.  Right Ear: External ear normal.  Left Ear: External ear normal.  Nose: Nose normal.  Mouth/Throat: Oropharynx is clear and moist.  Eyes: EOM are normal. Pupils are equal, round, and reactive to light.  Neck: Neck supple. No tracheal deviation present.  Cardiovascular: Normal rate and normal heart sounds.   Respiratory: Effort normal. No stridor. No respiratory distress. She has wheezes. She has no rales.  Significant wheezing bilaterally  GI: Soft. She exhibits no distension. There is tenderness. There is guarding. There is no rebound.  Voluntary guarding in the right lower quadrant, no generalized tenderness  Musculoskeletal: Normal range of motion.  Neurological: She is alert and oriented to person, place, and time. She exhibits normal muscle tone.  Skin: Skin is warm.  Psychiatric: She has a normal mood and affect.     Assessment/Plan Acute appendicitis - Zosyn IV has been ordered by the emergency department physician, toOR for laparoscopic appendectomy. Procedure, risks, and benefits were discussed in detail with her. She is agreeable.  COPD - DuoNeb treatment now and postoperatively  Zenovia Jarred, MD 09/23/2015, 3:19 PM

## 2015-09-23 NOTE — ED Notes (Signed)
Pt to ED via [facility] for evaluation of midsternal chest pain radiating to RLQ of abdomen. Ongoing since Friday. Denies SOB. 10/10 pain upon arrival to ED. 18g L forearm. Pt is alert and oriented x4. Took 324 ASA at home.

## 2015-09-23 NOTE — Anesthesia Procedure Notes (Signed)
Procedure Name: Intubation Date/Time: 09/23/2015 4:04 PM Performed by: Marinda Elk A Pre-anesthesia Checklist: Patient identified, Emergency Drugs available, Suction available, Patient being monitored and Timeout performed Patient Re-evaluated:Patient Re-evaluated prior to inductionOxygen Delivery Method: Circle System Utilized and Circle system utilized Preoxygenation: Pre-oxygenation with 100% oxygen Intubation Type: IV induction, Rapid sequence and Cricoid Pressure applied Ventilation: Mask ventilation without difficulty Laryngoscope Size: Mac and 3 Grade View: Grade II Tube type: Oral Tube size: 7.5 mm Number of attempts: 1 Airway Equipment and Method: Stylet Placement Confirmation: ETT inserted through vocal cords under direct vision,  positive ETCO2 and breath sounds checked- equal and bilateral Secured at: 22 cm Tube secured with: Tape Dental Injury: Teeth and Oropharynx as per pre-operative assessment

## 2015-09-23 NOTE — Anesthesia Postprocedure Evaluation (Signed)
Anesthesia Post Note  Patient: Grace [name]  Procedure(s) Performed: Procedure(s) (LRB): APPENDECTOMY LAPAROSCOPIC (N/A)  Patient location during evaluation: PACU Anesthesia Type: General Level of consciousness: awake Pain management: pain level controlled Vital Signs Assessment: post-procedure vital signs reviewed and stable Respiratory status: spontaneous breathing Cardiovascular status: stable Anesthetic complications: no    Last Vitals:  Filed Vitals:   09/23/15 1942 09/23/15 1945  BP:  118/65  Pulse: 106 107  Temp:  36.7 C  Resp: 27 24    Last Pain:  Filed Vitals:   09/23/15 2052  PainSc: 5                  EDWARDS,Alyah Boehning

## 2015-09-24 ENCOUNTER — Encounter (HOSPITAL_COMMUNITY): Payer: Self-pay | Admitting: General Surgery

## 2015-09-24 DIAGNOSIS — R0902 Hypoxemia: Secondary | ICD-10-CM

## 2015-09-24 DIAGNOSIS — J9811 Atelectasis: Secondary | ICD-10-CM

## 2015-09-24 DIAGNOSIS — J9600 Acute respiratory failure, unspecified whether with hypoxia or hypercapnia: Secondary | ICD-10-CM

## 2015-09-24 DIAGNOSIS — F17200 Nicotine dependence, unspecified, uncomplicated: Secondary | ICD-10-CM

## 2015-09-24 LAB — BASIC METABOLIC PANEL
Anion gap: 7 (ref 5–15)
BUN: 26 mg/dL — ABNORMAL HIGH (ref 6–20)
CALCIUM: 8.6 mg/dL — AB (ref 8.9–10.3)
CO2: 24 mmol/L (ref 22–32)
CREATININE: 1.25 mg/dL — AB (ref 0.44–1.00)
Chloride: 106 mmol/L (ref 101–111)
GFR calc non Af Amer: 46 mL/min — ABNORMAL LOW (ref >60)
GFR, EST AFRICAN AMERICAN: 53 mL/min — AB (ref >60)
Glucose, Bld: 211 mg/dL — ABNORMAL HIGH (ref 65–99)
Potassium: 4.1 mmol/L (ref 3.5–5.1)
Sodium: 137 mmol/L (ref 135–145)

## 2015-09-24 LAB — CBC
HEMATOCRIT: 40.7 % (ref 36.0–46.0)
Hemoglobin: 12.3 g/dL (ref 12.0–15.0)
MCH: 28.1 pg (ref 26.0–34.0)
MCHC: 30.2 g/dL (ref 30.0–36.0)
MCV: 93.1 fL (ref 78.0–100.0)
Platelets: 161 10*3/uL (ref 150–400)
RBC: 4.37 MIL/uL (ref 3.87–5.11)
RDW: 14.6 % (ref 11.5–15.5)
WBC: 7.4 10*3/uL (ref 4.0–10.5)

## 2015-09-24 LAB — GLUCOSE, CAPILLARY
GLUCOSE-CAPILLARY: 150 mg/dL — AB (ref 65–99)
GLUCOSE-CAPILLARY: 136 mg/dL — AB (ref 65–99)
Glucose-Capillary: 187 mg/dL — ABNORMAL HIGH (ref 65–99)
Glucose-Capillary: 168 mg/dL — ABNORMAL HIGH (ref 65–99)

## 2015-09-24 LAB — MRSA PCR SCREENING: MRSA BY PCR: NEGATIVE

## 2015-09-24 MED ORDER — HYDROMORPHONE HCL 1 MG/ML IJ SOLN
1.0000 mg | INTRAMUSCULAR | Status: DC | PRN
Start: 2015-09-24 — End: 2015-09-25
  Administered 2015-09-24 – 2015-09-25 (×3): 1 mg via INTRAVENOUS
  Filled 2015-09-24 (×3): qty 1

## 2015-09-24 MED ORDER — SODIUM CHLORIDE 0.9 % IV BOLUS (SEPSIS)
1000.0000 mL | Freq: Once | INTRAVENOUS | Status: AC
  Administered 2015-09-24: 1000 mL via INTRAVENOUS

## 2015-09-24 MED ORDER — BUDESONIDE 0.5 MG/2ML IN SUSP
0.5000 mg | Freq: Two times a day (BID) | RESPIRATORY_TRACT | Status: DC
  Administered 2015-09-24 – 2015-10-01 (×15): 0.5 mg via RESPIRATORY_TRACT
  Filled 2015-09-24 (×15): qty 2

## 2015-09-24 NOTE — Progress Notes (Signed)
Patient ID: Grace [name], female   DOB: 01/23/1956, 60 y.o.   MRN: [id number]     [facility]      [address] [place], [place] [address]    Phone: [phone] FAX: [phone]     Subjective: 32m drain output. Wbc normalized from 19.3k. sCr improved from 1.62 to 1.25. Tachy, bp stable. Afebrile.  Pain not well controlled.   Objective:  Vital signs:  Filed Vitals:   09/24/15 0417 09/24/15 0644 09/24/15 0732 09/24/15 0922  BP:   102/54   Pulse:   111 113  Temp: 98.8 F (37.1 C)  99.7 F (37.6 C)   TempSrc: Oral  Oral   Resp:   28 22  Height:      Weight:      SpO2:  91% 93% 99%    Last BM Date: 09/23/15  Intake/Output   Yesterday:  06/11 0701 - 06/12 0700 In: 4986.7 [P.O.:120; I.V.:4816.7; IV Piggyback:50] Out: 920 [Urine:850; Drains:60; Blood:10] This shift:  Total I/O In: 150 [I.V.:150] Out: 0     Physical Exam: General: Pt awake/alert/oriented x4 in no acute distress Chest: cta.  No chest wall pain w good excursion CV:  Pulses intact.  Regular rhythm MS: Normal AROM mjr joints.  No obvious deformity Abdomen: Soft.  Nondistended.  Mildly tender at incisions only.  rlq jp drain with serosanguinous output. No evidence of peritonitis.  No incarcerated hernias. Ext:  SCDs BLE.  No mjr edema.  No cyanosis Skin: No petechiae / purpura   Problem List:   Active Problems:   Perforated appendicitis    Results:   Labs: Results for orders placed or performed during the hospital encounter of 09/23/15 (from the past 48 hour(s))  Magnesium     Status: None   Collection Time: 09/23/15 11:57 AM  Result Value Ref Range   Magnesium 2.0 1.7 - 2.4 mg/dL  Brain natriuretic peptide     Status: Abnormal   Collection Time: 09/23/15 11:57 AM  Result Value Ref Range   B Natriuretic Peptide 139.8 (H) 0.0 - 100.0 pg/mL  CBC with Differential     Status: Abnormal   Collection Time: 09/23/15 11:57 AM  Result Value Ref  Range   WBC 19.3 (H) 4.0 - 10.5 K/uL   RBC 4.76 3.87 - 5.11 MIL/uL   Hemoglobin 13.5 12.0 - 15.0 g/dL   HCT 42.9 36.0 - 46.0 %   MCV 90.1 78.0 - 100.0 fL   MCH 28.4 26.0 - 34.0 pg   MCHC 31.5 30.0 - 36.0 g/dL   RDW 14.2 11.5 - 15.5 %   Platelets 211 150 - 400 K/uL   Neutrophils Relative % 83 %   Neutro Abs 16.0 (H) 1.7 - 7.7 K/uL   Lymphocytes Relative 6 %   Lymphs Abs 1.1 0.7 - 4.0 K/uL   Monocytes Relative 11 %   Monocytes Absolute 2.1 (H) 0.1 - 1.0 K/uL   Eosinophils Relative 0 %   Eosinophils Absolute 0.0 0.0 - 0.7 K/uL   Basophils Relative 0 %   Basophils Absolute 0.0 0.0 - 0.1 K/uL  Comprehensive metabolic panel     Status: Abnormal   Collection Time: 09/23/15 11:57 AM  Result Value Ref Range   Sodium 136 135 - 145 mmol/L   Potassium 3.6 3.5 - 5.1 mmol/L   Chloride 101 101 - 111 mmol/L   CO2 26 22 - 32 mmol/L   Glucose, Bld 119 (H) 65 -  99 mg/dL   BUN 25 (H) 6 - 20 mg/dL   Creatinine, Ser 1.62 (H) 0.44 - 1.00 mg/dL   Calcium 8.9 8.9 - 10.3 mg/dL   Total Protein 6.1 (L) 6.5 - 8.1 g/dL   Albumin 3.0 (L) 3.5 - 5.0 g/dL   AST 16 15 - 41 U/L   ALT 12 (L) 14 - 54 U/L   Alkaline Phosphatase 78 38 - 126 U/L   Total Bilirubin 0.7 0.3 - 1.2 mg/dL   GFR calc non Af Amer 33 (L) >60 mL/min   GFR calc Af Amer 39 (L) >60 mL/min    Comment: (NOTE) The eGFR has been calculated using the CKD EPI equation. This calculation has not been validated in all clinical situations. eGFR's persistently <60 mL/min signify possible Chronic Kidney Disease.    Anion gap 9 5 - 15  I-Stat Troponin, ED - 0, 3, 6 hours (not at [facility])     Status: None   Collection Time: 09/23/15 12:01 PM  Result Value Ref Range   Troponin i, poc 0.00 0.00 - 0.08 ng/mL   Comment 3            Comment: Due to the release kinetics of cTnI, a negative result within the first hours of the onset of symptoms does not rule out myocardial infarction with certainty. If myocardial infarction is still suspected, repeat the  test at appropriate intervals.   Glucose, capillary     Status: Abnormal   Collection Time: 09/23/15  5:46 PM  Result Value Ref Range   Glucose-Capillary 121 (H) 65 - 99 mg/dL  Glucose, capillary     Status: Abnormal   Collection Time: 09/23/15  9:08 PM  Result Value Ref Range   Glucose-Capillary 143 (H) 65 - 99 mg/dL  CBC     Status: None   Collection Time: 09/24/15  4:10 AM  Result Value Ref Range   WBC 7.4 4.0 - 10.5 K/uL   RBC 4.37 3.87 - 5.11 MIL/uL   Hemoglobin 12.3 12.0 - 15.0 g/dL   HCT 40.7 36.0 - 46.0 %   MCV 93.1 78.0 - 100.0 fL   MCH 28.1 26.0 - 34.0 pg   MCHC 30.2 30.0 - 36.0 g/dL   RDW 14.6 11.5 - 15.5 %   Platelets 161 150 - 400 K/uL  Basic metabolic panel     Status: Abnormal   Collection Time: 09/24/15  4:10 AM  Result Value Ref Range   Sodium 137 135 - 145 mmol/L   Potassium 4.1 3.5 - 5.1 mmol/L   Chloride 106 101 - 111 mmol/L   CO2 24 22 - 32 mmol/L   Glucose, Bld 211 (H) 65 - 99 mg/dL   BUN 26 (H) 6 - 20 mg/dL   Creatinine, Ser 1.25 (H) 0.44 - 1.00 mg/dL   Calcium 8.6 (L) 8.9 - 10.3 mg/dL   GFR calc non Af Amer 46 (L) >60 mL/min   GFR calc Af Amer 53 (L) >60 mL/min    Comment: (NOTE) The eGFR has been calculated using the CKD EPI equation. This calculation has not been validated in all clinical situations. eGFR's persistently <60 mL/min signify possible Chronic Kidney Disease.    Anion gap 7 5 - 15  MRSA PCR Screening     Status: None   Collection Time: 09/24/15  6:48 AM  Result Value Ref Range   MRSA by PCR NEGATIVE NEGATIVE    Comment:        The GeneXpert MRSA  Assay (FDA approved for NASAL specimens only), is one component of a comprehensive MRSA colonization surveillance program. It is not intended to diagnose MRSA infection nor to guide or monitor treatment for MRSA infections. Performed at [facility]   Glucose, capillary     Status: Abnormal   Collection Time: 09/24/15  7:30 AM  Result Value Ref Range    Glucose-Capillary 187 (H) 65 - 99 mg/dL   Comment 1 Notify RN     Imaging / Studies: Ct Abdomen Pelvis W Contrast  09/23/2015  CLINICAL DATA:  Lower abdominal pain and nausea starting Friday night EXAM: CT ABDOMEN AND PELVIS WITH CONTRAST TECHNIQUE: Multidetector CT imaging of the abdomen and pelvis was performed using the standard protocol following bolus administration of intravenous contrast. CONTRAST:  48m ISOVUE-300 IOPAMIDOL (ISOVUE-300) INJECTION 61% COMPARISON:  None FINDINGS: Lower chest:  The lung bases are unremarkable. Hepatobiliary: The patient is status postcholecystectomy. Mild intrahepatic biliary ductal dilatation. No focal hepatic mass. Pancreas: Enhanced pancreas is unremarkable. Spleen: Enhanced spleen is unremarkable. Adrenals/Urinary Tract: Mild thickening of adrenal glands without definite evidence of mass. Mild lobulated renal contour bilaterally. No hydronephrosis or hydroureter. There is a cyst in lower pole of the right kidney measures 1.2 cm. Cyst in lower pole of the left kidney measures 1.5 cm. No hydronephrosis or hydroureter. Delayed renal images shows bilateral renal symmetrical excretion. Bilateral visualized proximal ureter is unremarkable. Stomach/Bowel: No small bowel obstruction. No thickened or dilated small bowel loops. There is inflammatory process in right lower quadrant. There is significant thickening of the appendix up to 2 cm. There is significant periappendiceal stranding and small amount of periappendiceal fluid is noted. At least 2 calcified appendicoliths are noted the largest at the base of the appendix measures 9 mm. Findings are consistent with acute appendicitis. There is no definite evidence of perforation, however micro perforation cannot be excluded. Vascular/Lymphatic: Atherosclerotic calcifications of abdominal aorta and iliac arteries are noted. No aortic aneurysm. Reproductive: Mild enlarged uterus. At least 3 by material fibroids are noted within  uterus the largest in right fundal region measures 5.3 cm. Other: There is no evidence of free abdominal air. Small amount of fluid and stranding of the mesenteric fat is noted in right paracolic gutter. Musculoskeletal: No destructive bony lesions are noted. Degenerative changes lumbar spine. Mild degenerative changes bilateral SI joints. Mild degenerative changes pubic symphysis. IMPRESSION: 1. Findings consistent with acute appendicitis as described above. 2. No hydronephrosis or hydroureter. 3. No small bowel obstruction. 4. No hydronephrosis or hydroureter. 5. Mild enlarged uterus with multiple myometrial fibroids the largest measures 5.3 cm in right fundal region. 6. Degenerative changes lumbar spine. These results were called by telephone at the time of interpretation on 09/23/2015 at 2:46 pm to Dr. DLeo Grosser, who verbally acknowledged these results. Electronically Signed   By: LLahoma CrockerM.D.   On: 09/23/2015 14:48   Dg Abd Acute W/chest  09/23/2015  CLINICAL DATA:  Pt c/o diarrhea/vomiting since Friday. Severe lower abdominal pain today, no appetite. Some mid-chest pain. Smoker 1/2 ppd. Diabetic type 2. Copd. Htn. Hx gallstones. EXAM: DG ABDOMEN ACUTE W/ 1V CHEST COMPARISON:  None. FINDINGS: Frontal view of the chest demonstrates midline trachea. Normal heart size. Atherosclerosis in the transverse aorta. No pleural effusion or pneumothorax. Probable EKG lead artifact projecting over the upper lobes bilaterally. Abdominal films demonstrate minimal small bowel air-fluid levels in the left-sided abdomen. Borderline mid small bowel distension. Cholecystectomy. Convex left lumbar spine curvature. Distal gas and  stool. IMPRESSION: Nonspecific minimal left abdominal air-fluid levels and borderline small bowel distension. Question mild adynamic ileus as can be seen with gastroenteritis. Probable EKG lead artifacts projecting over the upper lungs. Consider repeat radiograph with removal of all support  apparatus. Electronically Signed   By: Abigail Miyamoto M.D.   On: 09/23/2015 12:38    Medications / Allergies:  Scheduled Meds: . arformoterol  15 mcg Nebulization BID   And  . umeclidinium bromide  1 puff Inhalation Daily  . budesonide (PULMICORT) nebulizer solution  0.5 mg Nebulization Q12H  . enoxaparin (LOVENOX) injection  30 mg Subcutaneous Q24H  . insulin aspart  0-20 Units Subcutaneous TID WC  . insulin aspart  0-5 Units Subcutaneous QHS  . ipratropium-albuterol  3 mL Nebulization Q6H  . levothyroxine  175 mcg Oral QAC breakfast  . lisinopril  10 mg Oral Daily  . metoprolol tartrate  25 mg Oral BID  . piperacillin-tazobactam (ZOSYN)  IV  3.375 g Intravenous Q8H  . traZODone  100 mg Oral QHS   Continuous Infusions: . dextrose 5 % and 0.45 % NaCl with KCl 20 mEq/L 75 mL/hr at 09/24/15 0909   PRN Meds:.acetaminophen **OR** acetaminophen, HYDROmorphone (DILAUDID) injection, LORazepam, methocarbamol, ondansetron **OR** ondansetron (ZOFRAN) IV, oxyCODONE  Antibiotics: Anti-infectives    Start     Dose/Rate Route Frequency Ordered Stop   09/23/15 2015  piperacillin-tazobactam (ZOSYN) IVPB 3.375 g     3.375 g 12.5 mL/hr over 240 Minutes Intravenous Every 8 hours 09/23/15 2009     09/23/15 1500  piperacillin-tazobactam (ZOSYN) IVPB 3.375 g     3.375 g 100 mL/hr over 30 Minutes Intravenous  Once 09/23/15 1445 09/23/15 1610        Assessment/Plan Perforated appendicitis  POD#1 laparoscopic appendectomy--Dr. Grandville Silos -needs IS at bedside and instruction -mobilize -continue drain COPD-aggressive pulmonary toilet.  Appreciate CCM input Mild renal insufficiency-hydrate, AM labs  ID-zosyn D#1 FEN-clears, watch for ileus. Continue IVF. Change fentanyl to dilaudid VTE prophylaxis-scd, lovenox  Tachycardia-possibly pain related, monitor HTN-home meds DM II-SSI/CBGs  Dispo-SDU   Erby Pian, ANP-BC [facility] Pager [phone](7A-4:30P) For consults and  floor pages call [phone](7A-4:30P)  09/24/2015 10:53 AM

## 2015-09-24 NOTE — Progress Notes (Signed)
Name: Grace [name] MRN: [id number] DOB: Sep 13, 1955    ADMISSION DATE:  09/23/2015 CONSULTATION DATE:  09/23/15  REFERRING MD :  CCS  CHIEF COMPLAINT:  Abdominal pain  BRIEF PATIENT DESCRIPTION: 60F w/ hx COPD s/p laparotomy for acute appendicitis with post-op respiratory insufficiency  SIGNIFICANT EVENTS  6/11  Admit with abd pain, urgent appendectomy.  Required BiPAP post-op, PCCM consulted  STUDIES:    SUBJECTIVE: pt reports feeling SOB but states she is likely at her baseline.  She notes she is always SOB and "easily winded" but continues to smoke.  Reports abdominal soreness.  Wore BiPAP until ~ 0600.    VITAL SIGNS: Temp:  [97.6 F (36.4 C)-99.7 F (37.6 C)] 99.7 F (37.6 C) (06/12 0732) Pulse Rate:  [102-126] 111 (06/12 0732) Resp:  [15-28] 28 (06/12 0732) BP: (90-136)/(51-84) 102/54 mmHg (06/12 0732) SpO2:  [91 %-100 %] 93 % (06/12 0732) FiO2 (%):  [30 %] 30 % (06/12 0216) Weight:  [180 lb (81.647 kg)-196 lb 3.4 oz (89 kg)] 196 lb 3.4 oz (89 kg) (06/11 1945)  PHYSICAL EXAMINATION:  General Well nourished, well developed, no apparent distress  HEENT No gross abnormalities.  MM pink/moist  Pulmonary Clear to auscultation bilaterally with no wheezes, rales or ronchi. Diminished R base compared to L.   Cardiovascular Tachycardic, regular rhythm. S1, s2. No m/r/g. Distal pulses palpable.  Abdomen Soft, diffusely tender, mildly distended, hypoactive bowel sounds, no palpable organomegaly or masses. JP drain in place with scant serosanguinous drainage.   Musculoskeletal Grossly normal.   Lymphatics No cervical, supraclavicular or axillary adenopathy.   Neurologic Grossly intact. No focal deficits.   Skin/Integuement No rash, no cyanosis, no clubbing.       Recent Labs Lab 09/23/15 1157 09/24/15 0410  NA 136 137  K 3.6 4.1  CL 101 106  CO2 26 24  BUN 25* 26*  CREATININE 1.62* 1.25*  GLUCOSE 119* 211*    Recent Labs Lab 09/23/15 1157 09/24/15 0410  HGB  13.5 12.3  HCT 42.9 40.7  WBC 19.3* 7.4  PLT 211 161   Ct Abdomen Pelvis W Contrast  09/23/2015  CLINICAL DATA:  Lower abdominal pain and nausea starting Friday night EXAM: CT ABDOMEN AND PELVIS WITH CONTRAST TECHNIQUE: Multidetector CT imaging of the abdomen and pelvis was performed using the standard protocol following bolus administration of intravenous contrast. CONTRAST:  67mL ISOVUE-300 IOPAMIDOL (ISOVUE-300) INJECTION 61% COMPARISON:  None FINDINGS: Lower chest:  The lung bases are unremarkable. Hepatobiliary: The patient is status postcholecystectomy. Mild intrahepatic biliary ductal dilatation. No focal hepatic mass. Pancreas: Enhanced pancreas is unremarkable. Spleen: Enhanced spleen is unremarkable. Adrenals/Urinary Tract: Mild thickening of adrenal glands without definite evidence of mass. Mild lobulated renal contour bilaterally. No hydronephrosis or hydroureter. There is a cyst in lower pole of the right kidney measures 1.2 cm. Cyst in lower pole of the left kidney measures 1.5 cm. No hydronephrosis or hydroureter. Delayed renal images shows bilateral renal symmetrical excretion. Bilateral visualized proximal ureter is unremarkable. Stomach/Bowel: No small bowel obstruction. No thickened or dilated small bowel loops. There is inflammatory process in right lower quadrant. There is significant thickening of the appendix up to 2 cm. There is significant periappendiceal stranding and small amount of periappendiceal fluid is noted. At least 2 calcified appendicoliths are noted the largest at the base of the appendix measures 9 mm. Findings are consistent with acute appendicitis. There is no definite evidence of perforation, however micro perforation cannot be excluded. Vascular/Lymphatic: Atherosclerotic calcifications  of abdominal aorta and iliac arteries are noted. No aortic aneurysm. Reproductive: Mild enlarged uterus. At least 3 by material fibroids are noted within uterus the largest in right  fundal region measures 5.3 cm. Other: There is no evidence of free abdominal air. Small amount of fluid and stranding of the mesenteric fat is noted in right paracolic gutter. Musculoskeletal: No destructive bony lesions are noted. Degenerative changes lumbar spine. Mild degenerative changes bilateral SI joints. Mild degenerative changes pubic symphysis. IMPRESSION: 1. Findings consistent with acute appendicitis as described above. 2. No hydronephrosis or hydroureter. 3. No small bowel obstruction. 4. No hydronephrosis or hydroureter. 5. Mild enlarged uterus with multiple myometrial fibroids the largest measures 5.3 cm in right fundal region. 6. Degenerative changes lumbar spine. These results were called by telephone at the time of interpretation on 09/23/2015 at 2:46 pm to Dr. Leo Grosser , who verbally acknowledged these results. Electronically Signed   By: Lahoma Crocker M.D.   On: 09/23/2015 14:48   Dg Abd Acute W/chest  09/23/2015  CLINICAL DATA:  Pt c/o diarrhea/vomiting since Friday. Severe lower abdominal pain today, no appetite. Some mid-chest pain. Smoker 1/2 ppd. Diabetic type 2. Copd. Htn. Hx gallstones. EXAM: DG ABDOMEN ACUTE W/ 1V CHEST COMPARISON:  None. FINDINGS: Frontal view of the chest demonstrates midline trachea. Normal heart size. Atherosclerosis in the transverse aorta. No pleural effusion or pneumothorax. Probable EKG lead artifact projecting over the upper lobes bilaterally. Abdominal films demonstrate minimal small bowel air-fluid levels in the left-sided abdomen. Borderline mid small bowel distension. Cholecystectomy. Convex left lumbar spine curvature. Distal gas and stool. IMPRESSION: Nonspecific minimal left abdominal air-fluid levels and borderline small bowel distension. Question mild adynamic ileus as can be seen with gastroenteritis. Probable EKG lead artifacts projecting over the upper lungs. Consider repeat radiograph with removal of all support apparatus. Electronically Signed    By: Abigail Miyamoto M.D.   On: 09/23/2015 12:38   No PFTs in the [facility] system  ASSESSMENT / PLAN:  1. Respiratory insufficiency with increased work of breathing post operatively 2. COPD   Discussion: Ms. Roszak is a pleasant 49F who is having some difficulty after her appendectomy characterized by increased work of breathing, tightness, reported wheezing (resolved on BiPAP) and intermittent desaturations. She has a history of COPD and active tobacco use. It does not appear that she is a chronic CO2 retainer, however. We do not have available PFTs, though she denies frequent exacerbations. After abdominal surgery, her inspiratory effort will likely be poor and she will likely benefit from BiPAP support early in post-operative care.  The stress of surgery may precipitate an exacerbation and triple therapy with LABA/LAMA/ICS is reasonable to try to prevent further respiratory compromise.   Recommendations:  BiPAP PRN post-op; wean as tolerated  Supplemental oxygen as needed to maintain sats >90%  Continue Brovana and Incruse   Pulmicort nebs BID acutely; can likely d/c or change to MDI prior to discharge  DuoNebs q6h prn   Would hold off on systemic steroids for now  Encourage pulmonary hygiene - mobilize, incentive spirometery  OOB and ambulatory ASAP  Avoid volume overload; target euvolemia to slightly net negative.  Reduce IVF to 75 ml/hr  Will likely need assistance from case management to determine an inhaler regimen (LAMA or LAMA/LABA +/- ICS) at discharge  Minimize narcotics as able  Noe Gens, NP-C [facility] Pgr: [phone] or if no answer [phone]  Attending Note:  60 year old female active smoker with  COPD that is not O2 dependent presenting to the pulmonary service post appendectomy.  BiPAP was started.  I reviewed CXR myself, evidence of hyperinflation.  On exam, decreased BS diffusely.  Discussed with PCCM-NP.  Acute respiratory failure:  -  Change BiPAP to PRN.  - Minimize narcotics.  - Monitor.  Hypoxemia:  - Supplemental O2 for sat of 88-92%.  - May require an ambulatory desat study prior to discharge for home O2 if still dependent.  COPD:  - Brovana.  - Incruse.  - Pulmicort nebs BID.  - Duonebs.  - PRN albuterol.  Tobacco abuse:  - Smoking cessation.  Atelectasis:  - IS to avoid atelectasis.  - Early ambulation.  Patient seen and examined, agree with above note.  I dictated the care and orders written for this patient under my direction.  Rush Farmer, MD [phone]  09/24/2015, 9:24 AM

## 2015-09-24 NOTE — Progress Notes (Signed)
Patient remains off of BIPAP at this time on 2 liters with a spo2 of 96%, no distress noted. BIPAP on standby if needed.

## 2015-09-24 NOTE — Consult Note (Deleted)
Name: Grace [name] MRN: [id number] DOB: 13-May-1955    ADMISSION DATE:  09/23/2015 CONSULTATION DATE:  09/23/15  REFERRING MD :  CCS  CHIEF COMPLAINT:  Abdominal pain  BRIEF PATIENT DESCRIPTION: 39F w/ hx COPD s/p laparotomy for acute appendicitis with post-op respiratory insufficiency  SIGNIFICANT EVENTS  6/11  Admit with abd pain, urgent appendectomy.  Required BiPAP post-op, PCCM consulted  STUDIES:    SUBJECTIVE: pt reports feeling SOB but states she is likely at her baseline.  She notes she is always SOB and "easily winded" but continues to smoke.  Reports abdominal soreness.  Wore BiPAP until ~ 0600.    VITAL SIGNS: Temp:  [97.6 F (36.4 C)-99.7 F (37.6 C)] 99.7 F (37.6 C) (06/12 0732) Pulse Rate:  [102-126] 111 (06/12 0732) Resp:  [15-28] 28 (06/12 0732) BP: (90-136)/(51-84) 102/54 mmHg (06/12 0732) SpO2:  [91 %-100 %] 93 % (06/12 0732) FiO2 (%):  [30 %] 30 % (06/12 0216) Weight:  [180 lb (81.647 kg)-196 lb 3.4 oz (89 kg)] 196 lb 3.4 oz (89 kg) (06/11 1945)  PHYSICAL EXAMINATION:  General Well nourished, well developed, no apparent distress  HEENT No gross abnormalities.  MM pink/moist  Pulmonary Clear to auscultation bilaterally with no wheezes, rales or ronchi. Diminished R base compared to L.   Cardiovascular Tachycardic, regular rhythm. S1, s2. No m/r/g. Distal pulses palpable.  Abdomen Soft, diffusely tender, mildly distended, hypoactive bowel sounds, no palpable organomegaly or masses. JP drain in place with scant serosanguinous drainage.   Musculoskeletal Grossly normal.   Lymphatics No cervical, supraclavicular or axillary adenopathy.   Neurologic Grossly intact. No focal deficits.   Skin/Integuement No rash, no cyanosis, no clubbing.       Recent Labs Lab 09/23/15 1157 09/24/15 0410  NA 136 137  K 3.6 4.1  CL 101 106  CO2 26 24  BUN 25* 26*  CREATININE 1.62* 1.25*  GLUCOSE 119* 211*    Recent Labs Lab 09/23/15 1157 09/24/15 0410  HGB  13.5 12.3  HCT 42.9 40.7  WBC 19.3* 7.4  PLT 211 161   Ct Abdomen Pelvis W Contrast  09/23/2015  CLINICAL DATA:  Lower abdominal pain and nausea starting Friday night EXAM: CT ABDOMEN AND PELVIS WITH CONTRAST TECHNIQUE: Multidetector CT imaging of the abdomen and pelvis was performed using the standard protocol following bolus administration of intravenous contrast. CONTRAST:  9mL ISOVUE-300 IOPAMIDOL (ISOVUE-300) INJECTION 61% COMPARISON:  None FINDINGS: Lower chest:  The lung bases are unremarkable. Hepatobiliary: The patient is status postcholecystectomy. Mild intrahepatic biliary ductal dilatation. No focal hepatic mass. Pancreas: Enhanced pancreas is unremarkable. Spleen: Enhanced spleen is unremarkable. Adrenals/Urinary Tract: Mild thickening of adrenal glands without definite evidence of mass. Mild lobulated renal contour bilaterally. No hydronephrosis or hydroureter. There is a cyst in lower pole of the right kidney measures 1.2 cm. Cyst in lower pole of the left kidney measures 1.5 cm. No hydronephrosis or hydroureter. Delayed renal images shows bilateral renal symmetrical excretion. Bilateral visualized proximal ureter is unremarkable. Stomach/Bowel: No small bowel obstruction. No thickened or dilated small bowel loops. There is inflammatory process in right lower quadrant. There is significant thickening of the appendix up to 2 cm. There is significant periappendiceal stranding and small amount of periappendiceal fluid is noted. At least 2 calcified appendicoliths are noted the largest at the base of the appendix measures 9 mm. Findings are consistent with acute appendicitis. There is no definite evidence of perforation, however micro perforation cannot be excluded. Vascular/Lymphatic: Atherosclerotic calcifications  of abdominal aorta and iliac arteries are noted. No aortic aneurysm. Reproductive: Mild enlarged uterus. At least 3 by material fibroids are noted within uterus the largest in right  fundal region measures 5.3 cm. Other: There is no evidence of free abdominal air. Small amount of fluid and stranding of the mesenteric fat is noted in right paracolic gutter. Musculoskeletal: No destructive bony lesions are noted. Degenerative changes lumbar spine. Mild degenerative changes bilateral SI joints. Mild degenerative changes pubic symphysis. IMPRESSION: 1. Findings consistent with acute appendicitis as described above. 2. No hydronephrosis or hydroureter. 3. No small bowel obstruction. 4. No hydronephrosis or hydroureter. 5. Mild enlarged uterus with multiple myometrial fibroids the largest measures 5.3 cm in right fundal region. 6. Degenerative changes lumbar spine. These results were called by telephone at the time of interpretation on 09/23/2015 at 2:46 pm to Dr. Leo Grosser , who verbally acknowledged these results. Electronically Signed   By: Lahoma Crocker M.D.   On: 09/23/2015 14:48   Dg Abd Acute W/chest  09/23/2015  CLINICAL DATA:  Pt c/o diarrhea/vomiting since Friday. Severe lower abdominal pain today, no appetite. Some mid-chest pain. Smoker 1/2 ppd. Diabetic type 2. Copd. Htn. Hx gallstones. EXAM: DG ABDOMEN ACUTE W/ 1V CHEST COMPARISON:  None. FINDINGS: Frontal view of the chest demonstrates midline trachea. Normal heart size. Atherosclerosis in the transverse aorta. No pleural effusion or pneumothorax. Probable EKG lead artifact projecting over the upper lobes bilaterally. Abdominal films demonstrate minimal small bowel air-fluid levels in the left-sided abdomen. Borderline mid small bowel distension. Cholecystectomy. Convex left lumbar spine curvature. Distal gas and stool. IMPRESSION: Nonspecific minimal left abdominal air-fluid levels and borderline small bowel distension. Question mild adynamic ileus as can be seen with gastroenteritis. Probable EKG lead artifacts projecting over the upper lungs. Consider repeat radiograph with removal of all support apparatus. Electronically Signed    By: Abigail Miyamoto M.D.   On: 09/23/2015 12:38   No PFTs in the [facility] system  ASSESSMENT / PLAN:  1. Respiratory insufficiency with increased work of breathing post operatively 2. COPD   Discussion: Ms. Welding is a pleasant 89F who is having some difficulty after her appendectomy characterized by increased work of breathing, tightness, reported wheezing (resolved on BiPAP) and intermittent desaturations. She has a history of COPD and active tobacco use. It does not appear that she is a chronic CO2 retainer, however. We do not have available PFTs, though she denies frequent exacerbations. After abdominal surgery, her inspiratory effort will likely be poor and she will likely benefit from BiPAP support early in post-operative care.  The stress of surgery may precipitate an exacerbation and triple therapy with LABA/LAMA/ICS is reasonable to try to prevent further respiratory compromise.   Recommendations:  BiPAP PRN post-op; wean as tolerated  Supplemental oxygen as needed to maintain sats >90%  Continue Brovana and Incruse   Pulmicort nebs BID acutely; can likely d/c or change to MDI prior to discharge  DuoNebs q6h prn   Would hold off on systemic steroids for now  Encourage pulmonary hygiene - mobilize, incentive spirometery  OOB and ambulatory ASAP  Avoid volume overload; target euvolemia to slightly net negative.  Reduce IVF to 75 ml/hr  Will likely need assistance from case management to determine an inhaler regimen (LAMA or LAMA/LABA +/- ICS) at discharge  Minimize narcotics as able     Noe Gens, NP-C [facility] Pgr: [phone] or if no answer [phone] 09/24/2015, 9:00 AM

## 2015-09-24 NOTE — Progress Notes (Signed)
Patient remains off of BIPAP with no complications. BIPAP on standby if needed.

## 2015-09-24 NOTE — Progress Notes (Signed)
Pt is off the NIV at this time. Pt has been titrated to a 4L  tolerating it well O2 sats are stable at this time. RN aware of titration.

## 2015-09-25 ENCOUNTER — Inpatient Hospital Stay (HOSPITAL_COMMUNITY): Payer: Medicare Other

## 2015-09-25 DIAGNOSIS — A419 Sepsis, unspecified organism: Secondary | ICD-10-CM

## 2015-09-25 DIAGNOSIS — R6521 Severe sepsis with septic shock: Secondary | ICD-10-CM

## 2015-09-25 DIAGNOSIS — K352 Acute appendicitis with generalized peritonitis: Secondary | ICD-10-CM

## 2015-09-25 DIAGNOSIS — G9340 Encephalopathy, unspecified: Secondary | ICD-10-CM

## 2015-09-25 DIAGNOSIS — J9602 Acute respiratory failure with hypercapnia: Secondary | ICD-10-CM

## 2015-09-25 LAB — GLUCOSE, CAPILLARY
GLUCOSE-CAPILLARY: 137 mg/dL — AB (ref 65–99)
GLUCOSE-CAPILLARY: 129 mg/dL — AB (ref 65–99)
GLUCOSE-CAPILLARY: 134 mg/dL — AB (ref 65–99)
Glucose-Capillary: 124 mg/dL — ABNORMAL HIGH (ref 65–99)

## 2015-09-25 LAB — URINALYSIS, ROUTINE W REFLEX MICROSCOPIC
Bilirubin Urine: NEGATIVE
GLUCOSE, UA: NEGATIVE mg/dL
Ketones, ur: NEGATIVE mg/dL
LEUKOCYTES UA: NEGATIVE
NITRITE: NEGATIVE
PROTEIN: 100 mg/dL — AB
Specific Gravity, Urine: 1.024 (ref 1.005–1.030)
pH: 5.5 (ref 5.0–8.0)

## 2015-09-25 LAB — BLOOD GAS, ARTERIAL
Acid-base deficit: 3.1 mmol/L — ABNORMAL HIGH (ref 0.0–2.0)
Acid-base deficit: 1.5 mmol/L (ref 0.0–2.0)
BICARBONATE: 24.2 meq/L — AB (ref 20.0–24.0)
Bicarbonate: 23.3 mEq/L (ref 20.0–24.0)
DELIVERY SYSTEMS: POSITIVE
DRAWN BY: 347671
Drawn by: 34761
Expiratory PAP: 8
FIO2: 0.3
Inspiratory PAP: 17
O2 CONTENT: 3 L/min
O2 Saturation: 97.4 %
O2 Saturation: 96.2 %
PATIENT TEMPERATURE: 98.6
PCO2 ART: 56.9 mmHg — AB (ref 35.0–45.0)
PH ART: 7.237 — AB (ref 7.350–7.450)
PO2 ART: 81.1 mmHg (ref 80.0–100.0)
Patient temperature: 98.6
RATE: 8 resp/min
TCO2: 25.1 mmol/L (ref 0–100)
TCO2: 25.8 mmol/L (ref 0–100)
pCO2 arterial: 52.1 mmHg — ABNORMAL HIGH (ref 35.0–45.0)
pH, Arterial: 7.289 — ABNORMAL LOW (ref 7.350–7.450)
pO2, Arterial: 98.5 mmHg (ref 80.0–100.0)

## 2015-09-25 LAB — BASIC METABOLIC PANEL
Anion gap: 3 — ABNORMAL LOW (ref 5–15)
BUN: 29 mg/dL — AB (ref 6–20)
CHLORIDE: 108 mmol/L (ref 101–111)
CO2: 25 mmol/L (ref 22–32)
CREATININE: 1.17 mg/dL — AB (ref 0.44–1.00)
Calcium: 8.9 mg/dL (ref 8.9–10.3)
GFR calc Af Amer: 57 mL/min — ABNORMAL LOW (ref >60)
GFR calc non Af Amer: 50 mL/min — ABNORMAL LOW (ref >60)
Glucose, Bld: 134 mg/dL — ABNORMAL HIGH (ref 65–99)
Potassium: 4.7 mmol/L (ref 3.5–5.1)
Sodium: 136 mmol/L (ref 135–145)

## 2015-09-25 LAB — URINE MICROSCOPIC-ADD ON

## 2015-09-25 LAB — MAGNESIUM: MAGNESIUM: 1.5 mg/dL — AB (ref 1.7–2.4)

## 2015-09-25 LAB — BRAIN NATRIURETIC PEPTIDE: B NATRIURETIC PEPTIDE 5: 370.9 pg/mL — AB (ref 0.0–100.0)

## 2015-09-25 LAB — LACTIC ACID, PLASMA
Lactic Acid, Venous: 1 mmol/L (ref 0.5–2.0)
Lactic Acid, Venous: 0.9 mmol/L (ref 0.5–2.0)

## 2015-09-25 LAB — CBC
HCT: 40.5 % (ref 36.0–46.0)
HEMOGLOBIN: 11.6 g/dL — AB (ref 12.0–15.0)
MCH: 27.1 pg (ref 26.0–34.0)
MCHC: 28.6 g/dL — AB (ref 30.0–36.0)
MCV: 94.6 fL (ref 78.0–100.0)
Platelets: 162 10*3/uL (ref 150–400)
RBC: 4.28 MIL/uL (ref 3.87–5.11)
RDW: 14.8 % (ref 11.5–15.5)
WBC: 10.3 10*3/uL (ref 4.0–10.5)

## 2015-09-25 LAB — CORTISOL: CORTISOL PLASMA: 22.1 ug/dL

## 2015-09-25 LAB — PROCALCITONIN: Procalcitonin: 8.63 ng/mL

## 2015-09-25 MED ORDER — LEVALBUTEROL HCL 0.63 MG/3ML IN NEBU
INHALATION_SOLUTION | RESPIRATORY_TRACT | Status: AC
  Administered 2015-09-25: 0.63 mg
  Filled 2015-09-25: qty 3

## 2015-09-25 MED ORDER — VANCOMYCIN HCL IN DEXTROSE 750-5 MG/150ML-% IV SOLN
750.0000 mg | Freq: Two times a day (BID) | INTRAVENOUS | Status: DC
  Administered 2015-09-26 – 2015-09-27 (×3): 750 mg via INTRAVENOUS
  Filled 2015-09-25 (×4): qty 150

## 2015-09-25 MED ORDER — NALOXONE HCL 0.4 MG/ML IJ SOLN
0.2000 mg | Freq: Once | INTRAMUSCULAR | Status: AC
  Administered 2015-09-25: 0.2 mg via INTRAVENOUS

## 2015-09-25 MED ORDER — NALOXONE HCL 0.4 MG/ML IJ SOLN
INTRAMUSCULAR | Status: AC
  Filled 2015-09-25: qty 1

## 2015-09-25 MED ORDER — FENTANYL CITRATE (PF) 100 MCG/2ML IJ SOLN
50.0000 ug | INTRAMUSCULAR | Status: DC | PRN
  Administered 2015-09-25 – 2015-09-26 (×4): 50 ug via INTRAVENOUS
  Filled 2015-09-25 (×4): qty 2

## 2015-09-25 MED ORDER — HALOPERIDOL LACTATE 5 MG/ML IJ SOLN: 5.0000 mg | Freq: Four times a day (QID) | INTRAMUSCULAR | Status: DC | PRN

## 2015-09-25 MED ORDER — HALOPERIDOL LACTATE 5 MG/ML IJ SOLN
INTRAMUSCULAR | Status: AC
  Administered 2015-09-25: 5 mg
  Filled 2015-09-25: qty 1

## 2015-09-25 MED ORDER — SODIUM CHLORIDE 0.9 % IV SOLN
1500.0000 mg | Freq: Once | INTRAVENOUS | Status: AC
  Administered 2015-09-25: 1500 mg via INTRAVENOUS
  Filled 2015-09-25: qty 1500

## 2015-09-25 MED ORDER — LEVALBUTEROL HCL 1.25 MG/0.5ML IN NEBU
1.2500 mg | INHALATION_SOLUTION | Freq: Four times a day (QID) | RESPIRATORY_TRACT | Status: DC | PRN
  Filled 2015-09-25 (×2): qty 0.5

## 2015-09-25 MED ORDER — FUROSEMIDE 10 MG/ML IJ SOLN
40.0000 mg | Freq: Four times a day (QID) | INTRAMUSCULAR | Status: AC
  Administered 2015-09-25 – 2015-09-26 (×3): 40 mg via INTRAVENOUS
  Filled 2015-09-25 (×3): qty 4

## 2015-09-25 MED ORDER — TAMSULOSIN HCL 0.4 MG PO CAPS
0.4000 mg | ORAL_CAPSULE | Freq: Every day | ORAL | Status: DC
  Administered 2015-09-27 – 2015-10-01 (×5): 0.4 mg via ORAL
  Filled 2015-09-25 (×6): qty 1

## 2015-09-25 MED ORDER — PHENYLEPHRINE HCL 10 MG/ML IJ SOLN
0.0000 ug/min | INTRAVENOUS | Status: DC
  Filled 2015-09-25: qty 1

## 2015-09-25 MED ORDER — DILTIAZEM HCL 25 MG/5ML IV SOLN
10.0000 mg | Freq: Once | INTRAVENOUS | Status: AC
  Administered 2015-09-25: 10 mg via INTRAVENOUS
  Filled 2015-09-25 (×2): qty 5

## 2015-09-25 MED ORDER — FENTANYL CITRATE (PF) 100 MCG/2ML IJ SOLN
25.0000 ug | INTRAMUSCULAR | Status: DC | PRN
Start: 2015-09-25 — End: 2015-09-25
  Administered 2015-09-25 (×3): 25 ug via INTRAVENOUS
  Filled 2015-09-25 (×3): qty 2

## 2015-09-25 MED ORDER — DILTIAZEM HCL 100 MG IV SOLR
5.0000 mg/h | INTRAVENOUS | Status: DC
  Administered 2015-09-25: 5 mg/h via INTRAVENOUS
  Filled 2015-09-25: qty 100

## 2015-09-25 NOTE — Progress Notes (Signed)
Upon assessment this am, pt's HR ST 130's. BP 94/51, 02 95% on 3L [place]. Patiently intermittently following commands, and staring into ceiling with hands extended in air. Pt not participating in exam, pt only able to tell me her name and she is in pain. PRN dilaudid given. PA notified. No new orders received. Will continue to monitor.

## 2015-09-25 NOTE — Progress Notes (Signed)
This note also relates to the following rows which could not be included: Pulse Rate - Cannot attach notes to unvalidated device data Resp - Cannot attach notes to unvalidated device data SpO2 - Cannot attach notes to unvalidated device data     09/25/15 0922  BiPAP/CPAP/SIPAP  BiPAP/CPAP/SIPAP Pt Type Adult  Mask Type Full face mask  Mask Size Medium  Set Rate 8 breaths/min  Respiratory Rate 19 breaths/min  IPAP 14 cmH20  EPAP 5 cmH2O  Oxygen Percent 30 %  Minute Ventilation 7  Leak 12  Peak Inspiratory Pressure (PIP) 13  Tidal Volume (Vt) 346  BiPAP/CPAP/SIPAP BiPAP  Press High Alarm 25 cmH2O  Press Low Alarm 5 cmH2O  Placed patient on bipap per MD and  ABG results.

## 2015-09-25 NOTE — Procedures (Signed)
Central Venous Catheter Insertion Procedure Note MELLINDA PILLION SN:[id number] 1955/07/16  Procedure: Insertion of Central Venous Catheter Indications: Assessment of intravascular volume, Drug and/or fluid administration and Frequent blood sampling  Procedure Details Consent: Risks of procedure as well as the alternatives and risks of each were explained to the (patient/caregiver).  Consent for procedure obtained.   Time Out: Verified patient identification, verified procedure, site/side was marked, verified correct patient position, special equipment/implants available, medications/allergies/relevent history reviewed, required imaging and test results available.  Performed  Maximum sterile technique was used including antiseptics, cap, gloves, gown, hand hygiene, mask and sheet. Skin prep: Chlorhexidine; local anesthetic administered A antimicrobial bonded/coated triple lumen catheter was placed in the right internal jugular vein using the Seldinger technique.  Evaluation Blood flow good Complications: No apparent complications Patient did tolerate procedure well. Chest X-ray ordered to verify placement.  CXR: pending.   Procedure performed under direct supervision of Dr. Nelda Marseille and with ultrasound guidance for real time vessel cannulation.     Noe Gens, NP-C [facility] Pgr: [phone] or if no answer [phone] 09/25/2015, 4:38 PM  Rush Farmer, M.D. [facility]Critical [facility] Medicine. Pager: [phone]. After hours pager: [phone].

## 2015-09-25 NOTE — Procedures (Signed)
Pt removed from BiPAP by RN and placed on VM.  Pt is tolerating well and resting comfortably.  RT will continue to monitor pt.

## 2015-09-25 NOTE — Progress Notes (Signed)
Pharmacy Antibiotic Note  Grace [name] is a 60 y.o. female admitted on 09/23/2015 with worsening RLQ abdominal pain and diarrhea. Work-up revealed acute appendicitis and the patient is now s/p emergent lap appy on 6/11. Post-op the patient had increased WOB requiring CCM consult and eventual transfer to the ICU for monitoring. The patient has been on Zosyn since 6/11 and pharmacy has now been consulted to add Vancomycin due to concern for HCAP. SCr 1.17, CrCl~50-60 ml/min.   Plan: 1. Vancomycin 1500 mg IV x 1 dose now to load 2. Vancomycin 750 mg IV every 12 hours 3. Will continue to follow renal function, culture results, LOT, and antibiotic de-escalation plans   Height: 5\' 3"  (160 cm) Weight: 196 lb 3.4 oz (89 kg) IBW/kg (Calculated) : 52.4  Temp (24hrs), Avg:98.7 F (37.1 C), Min:98.2 F (36.8 C), Max:99.7 F (37.6 C)   Recent Labs Lab 09/23/15 1157 09/24/15 0410 09/25/15 0245  WBC 19.3* 7.4 10.3  CREATININE 1.62* 1.25* 1.17*    Estimated Creatinine Clearance: 54.1 mL/min (by C-G formula based on Cr of 1.17).    Allergies  Allergen Reactions  . Codeine     REACTION: headaches  . Prozac [Fluoxetine Hcl] Other (See Comments)    Suicidal ideations    Antimicrobials this admission: Zosyn 6/11 >> Vancomycin 6/13 >>  Dose adjustments this admission: n/a  Microbiology results: 6/11 BCx >> ngtd 6/12 MRSA PCR >> negative  Thank you for allowing pharmacy to be a part of this patient's care.  Alycia Rossetti, PharmD, BCPS Clinical Pharmacist Pager: [phone] 09/25/2015 3:39 PM

## 2015-09-25 NOTE — Progress Notes (Signed)
PULMONARY / CRITICAL CARE MEDICINE   Name: Grace [name] MRN: [id number] DOB: 19-Jan-1956    ADMISSION DATE:  09/23/2015 CONSULTATION DATE:  09/23/2015  REFERRING MD:  Dr. Hulen Skains  CHIEF COMPLAINT:  Abdominal Pain   HISTORY OF PRESENT ILLNESS:   This is a 61 y.o. Female with past medical history of COPD, asthma, CVA (2002), tobacco abuse, diabetes mellitus, hypothyroidism, tachycardia, and barrett's esophagus.  She presented to [facility] ER on 6/11 with c/o worsening right lower quadrant abdominal pain with diarrhea onset 48 hrs prior to presentation.  She was found to have leukocytosis of 19,300.  CT scan of the abdomen and pelvis showed acute appendicitis.  On 6/11 pt had an emergent laparoscopic appendectomy.  PCCM consulted on 6/11 due to increased work of breathing and wheezing post extubation requiring Bipap secondary to COPD.  She was transitioned to nasal canula on 6/12.  On 6/13 pt placed back on bipap and transferred to ICU due to acute on chronic hypercarbic respiratory failure secondary to sedating medication and hypotension.  PAST MEDICAL HISTORY :  She  has a past medical history of GERD (gastroesophageal reflux disease); Hyperlipidemia; Hypertension; Migraine; COPD (chronic obstructive pulmonary disease) ([place]); Hypothyroidism; Asthma; Hiatal hernia; Barrett's esophagus; History of CVA (cerebrovascular accident) (10/2000); Endometrial polyp (03/2003); Duodenal ulcer disease; Internal hemorrhoids; History of pineal cyst; Fibroid uterus; Tachycardia; Nevus; H/O failed moderate sedation; Personal history of failed conscious sedation (08/11/2011); Thyroid disease; Chronic back pain; Abnormal vaginal bleeding; Tobacco abuse (Jan 2014); and Diabetes mellitus without complication ([place]).  PAST SURGICAL HISTORY: She  has past surgical history that includes Cholecystectomy; Myomectomy (1990, 1997); Colonoscopy (02/07/2010); Upper gastrointestinal endoscopy (12/18/2009); Hysteroscopy; and  laparoscopic appendectomy (N/A, 09/23/2015).  Allergies  Allergen Reactions  . Codeine     REACTION: headaches  . Prozac [Fluoxetine Hcl] Other (See Comments)    Suicidal ideations    No current facility-administered medications on file prior to encounter.   Current Outpatient Prescriptions on File Prior to Encounter  Medication Sig  . albuterol (PROAIR HFA) 108 (90 BASE) MCG/ACT inhaler Inhale 2 puffs into the lungs every 6 (six) hours as needed for wheezing or shortness of breath.  . Aspirin-Acetaminophen-Caffeine 024-097-35 MG PACK Take 1 Package by mouth as needed.   Marland Kitchen atorvastatin (LIPITOR) 80 MG tablet Take one tablet by mouth once daily at bedtime for cholesterol  . diphenhydrAMINE (BENADRYL) 25 MG tablet Take 25 mg by mouth daily as needed for sleep.   . fluticasone (FLOVENT HFA) 44 MCG/ACT inhaler INHALE TWO PUFFS INTO LUNGS TWICE DAILY  . glipiZIDE (GLUCOTROL XL) 5 MG 24 hr tablet Take one tablet by mouth once daily to control blood sugar  . ibuprofen (ADVIL,MOTRIN) 200 MG tablet Take 600 mg by mouth as needed for moderate pain.   Marland Kitchen Ketotifen Fumarate (REFRESH EYE ITCH RELIEF OP) Place 1 drop into both eyes daily as needed. For itchy eyes  . levothyroxine (SYNTHROID, LEVOTHROID) 175 MCG tablet Take 1 tablet (175 mcg total) by mouth daily before breakfast.  . lisinopril (PRINIVIL,ZESTRIL) 10 MG tablet Take one tablet by mouth once daily for blood pressure  . LORazepam (ATIVAN) 0.5 MG tablet Take one tablet by mouth three times daily as needed for anxiety or nervousness  . Melatonin 10 MG CAPS Take by mouth at bedtime.  . metoprolol tartrate (LOPRESSOR) 25 MG tablet Take 1 tablet (25 mg total) by mouth 2 (two) times daily.  . Multiple Vitamins-Minerals (HAIR/SKIN/NAILS) TABS Take 1 tablet by mouth daily.   Marland Kitchen  omeprazole (PRILOSEC) 20 MG capsule Take 1 capsule (20 mg total) by mouth daily.  . traZODone (DESYREL) 100 MG tablet Take 1 tablet (100 mg total) by mouth at bedtime.  Marland Kitchen  umeclidinium-vilanterol (ANORO ELLIPTA) 62.5-25 MCG/INH AEPB Inhale 1 puff into the lungs daily.  Marland Kitchen VALERIAN ROOT PO Take 1 tablet by mouth at bedtime.   Marland Kitchen glucose blood (ONETOUCH VERIO) test strip Use to self monitor blood glucose twice daily and as directed (E11.9)  . ONETOUCH DELICA LANCETS 32D MISC 1 each by Does not apply route 2 (two) times daily.  Marland Kitchen Respiratory Therapy Supplies (NEBULIZER/TUBING/MOUTHPIECE) KIT 1 nebulizer every 12 hours. DX: J44.9  . [DISCONTINUED] ipratropium (ATROVENT HFA) 17 MCG/ACT inhaler Inhale 2 puffs into the lungs 4 (four) times daily.    FAMILY HISTORY:  Her indicated that her mother is deceased. She indicated that her father is deceased.   SOCIAL HISTORY: She  reports that she has been smoking Cigarettes.  She has a 42 pack-year smoking history. She has never used smokeless tobacco. She reports that she does not drink alcohol or use illicit drugs.  REVIEW OF SYSTEMS:   Unable to assess pt currently on bipap  SUBJECTIVE:  Per RN this am pt had period where she stared off and did not follow commands RN notified surgery they assessed pt at bedside pt received 1x dose of dilaudid for pain management. Surgery reassessed pt and administered narcan. Pt later received haldol for agitation. ABG obtained pt noted to to be acidotic therefore pt placed on bipap pt continues to appear restless with moaning.   VITAL SIGNS: BP 118/69 mmHg  Pulse 128  Temp(Src) 98.4 F (36.9 C) (Oral)  Resp 20  Ht 5' 3"  (1.6 m)  Wt 196 lb 3.4 oz (89 kg)  BMI 34.77 kg/m2  SpO2 93%  LMP 02/27/2014  HEMODYNAMICS:    VENTILATOR SETTINGS:    INTAKE / OUTPUT: I/O last 3 completed shifts: In: 4304.2 [P.O.:240; I.V.:2974.2; Other:40; IV Piggyback:1050] Out: 57 [Urine:750; Drains:145]  PHYSICAL EXAMINATION: General: acute;u ill appearing female Neuro:  Follows commands, bilateral pupils 3 mm equal brisk  HEENT:  No gross abnormalities, MM pink/moist Cardiovascular:   Tachycardia, s1s2, no m/r/g. Distal pulses palpable Lungs:  Diminished throughout currently on bipap Abdomen: Soft, diffusely tender in all quadrants, distended, soft no bowel sounds, no palpable organomegaly or masses. JP drain in place with scant serosanguinous drainage Musculoskeletal:  Grossly normal Skin:  No rash, no cyanosis, or clubbing   LABS:  BMET  Recent Labs Lab 09/23/15 1157 09/24/15 0410 09/25/15 0245  NA 136 137 136  K 3.6 4.1 4.7  CL 101 106 108  CO2 26 24 25   BUN 25* 26* 29*  CREATININE 1.62* 1.25* 1.17*  GLUCOSE 119* 211* 134*    Electrolytes  Recent Labs Lab 09/23/15 1157 09/24/15 0410 09/25/15 0245  CALCIUM 8.9 8.6* 8.9  MG 2.0  --   --     CBC  Recent Labs Lab 09/23/15 1157 09/24/15 0410 09/25/15 0245  WBC 19.3* 7.4 10.3  HGB 13.5 12.3 11.6*  HCT 42.9 40.7 40.5  PLT 211 161 162    Coag's No results for input(s): APTT, INR in the last 168 hours.  Sepsis Markers No results for input(s): LATICACIDVEN, PROCALCITON, O2SATVEN in the last 168 hours.  ABG  Recent Labs Lab 09/25/15 0900 09/25/15 1221  PHART 7.237* 7.289*  PCO2ART 56.9* 52.1*  PO2ART 98.5 81.1    Liver Enzymes  Recent Labs Lab 09/23/15 1157  AST 16  ALT 12*  ALKPHOS 78  BILITOT 0.7  ALBUMIN 3.0*    Cardiac Enzymes No results for input(s): TROPONINI, PROBNP in the last 168 hours.  Glucose  Recent Labs Lab 09/24/15 0730 09/24/15 1138 09/24/15 1552 09/24/15 2103 09/25/15 0757 09/25/15 1212  GLUCAP 187* 168* 150* 136* 124* 137*    Imaging Dg Chest Port 1 View  09/25/2015  CLINICAL DATA:  Shortness of Breath EXAM: PORTABLE CHEST 1 VIEW COMPARISON:  September 23, 2015 FINDINGS: There are pleural effusions bilaterally. There is no edema or consolidation. There is patchy atelectasis in the left lower lobe. Heart is upper normal in size with pulmonary vascularity within normal limits. No adenopathy. No bone lesions. IMPRESSION: Pleural effusions bilaterally  which appear fairly small. Lung atelectasis left base. Heart upper normal in size. Suspect a degree of pulmonary vascular congestion. Electronically Signed   By: Lowella Grip III M.D.   On: 09/25/2015 09:35     STUDIES:  Ct of abdomen 6/11>>Findings consistent with acute appendicitis as described above.  No hydronephrosis or hydroureter. No small bowel obstruction. No hydronephrosis or hydroureter. Mild enlarged uterus with multiple myometrial fibroids the largest measures 5.3 cm in right fundal region.  Degenerative changes lumbar spine.  CULTURES: Blood cultures 6/11>>negative  ANTIBIOTICS: Zosyn 6/11>>  SIGNIFICANT EVENTS: 6/11>> pt admitted with appendicitis taken to OR for emergent appendectomy  6/11>> PCCM consulted due to increased work of breathing and wheezing post extubation after urgent appendectomy pt placed on bipap 6/13>> pt transferred to ICU due to hypotension and acute on chronic hypercarbic respiratory failure  LINES/TUBES: PIV's x2  DISCUSSION: 60 y.o. Female required urgent appendectomy secondary to appendicitis on 6/11.  Transferred to ICU on 6/13 due to hypotension and acute on chronic hypercarbic respiratory failure secondary to sedation medication  ASSESSMENT / PLAN:  PULMONARY A: Acute on chronic hypercarbic respiratory failure secondary to sedating medication and pulmonary edema COPD Hx: Asthma, Current tobacco abuse  P:   Continue bipap Monitor high risk for intubation Maintain O2 sats >90% Continue scheduled pulmicort nebs and xopenex  Obtain cortisol level  Avoid volume overload; target euvolemia to slightly net negative.  Stop IV fluids  CARDIOVASCULAR A:  Tachycardia  Hypotension P:  Hold outpatient metoprolol and lisinopril Neo-synephrine to maintain map >65 as needed  Telemetry monitoring   RENAL A:   Acute renal failure P:   Monitor uop Trend BMP's Replace electrolytes as indicated  GASTROINTESTINAL A:   Appendicitis s/p  appendectomy (6/11) Hx: Barrett's esophagus, duodenal ulcer disease P:   Surgery consulted appreciate input Keep NPO for now defer to surgery  Pepcid for PUP  HEMATOLOGIC A:   Hx: abnormal vaginal bleeding  P:  Lovenox for VTE prophylaxis Trend CBC's Monitor for s/sx of bleeding Transfuse for hgb <7  INFECTIOUS A:   Appendicitis s/p appendectomy (6/11) ?Peritonitis  P:   Trend WBC's and monitor fever curve Trend lactic acids and PCT's Continue antibiotic as listed above based on PCT results consider broadening antibiotic coverage  ENDOCRINE A:   Hx: Hypothyroidism and Diabetes Mellitus P:   Continue outpatient synthroid SSI  NEUROLOGIC A:  Lethargy secondary to sedating medications  Abdominal Pain Hx: CVA P:   Avoid sedating medication Frequent reorientation    FAMILY  - Updates: No family at bedside to update  - Inter-disciplinary family meet or Palliative Care meeting due by:  October 02, 2015  Marda Stalker, [place]  Attending Note:  60 year old female active  smoker with COPD that is not O2 dependent presenting to the pulmonary service post appendectomy. BiPAP was started. I reviewed CXR myself, evidence of hyperinflation. On exam, decreased BS diffusely. Discussed with PCCM-NP.  Now developing sepsis with shock  Acute respiratory failure, pulmonary edema noted. - Change BiPAP to continuous at this point. - Minimize narcotics. - Monitor.  - If fails then intubation.  - Hold off further ABGs at this time.  - Lasix 40 mg IV q6 x3 dosese.  Hypotension:  Concern for septic shock.  - With diureses will likely need pressors.  - Insert TLC.  - Follow CVP.  - Neo as ordered.  - KVO IVF.  Hypoxemia: - Supplemental O2 for sat of 88-92%. - May require an ambulatory desat study prior to discharge for home O2 if still  dependent.  COPD: - Brovana. - Incruse. - Pulmicort nebs BID. - Duonebs. - PRN albuterol.  Tobacco abuse: - Smoking cessation.  The patient is critically ill with multiple organ systems failure and requires high complexity decision making for assessment and support, frequent evaluation and titration of therapies, application of advanced monitoring technologies and extensive interpretation of multiple databases.   Critical Care Time devoted to patient care services described in this note is  35  Minutes. This time reflects time of care of this signee Dr Jennet Maduro. This critical care time does not reflect procedure time, or teaching time or supervisory time of PA/NP/Med student/Med Resident etc but could involve care discussion time.  Rush Farmer, M.D. [facility] Pulmonary/Critical Care Medicine. Pager: [phone]. After hours pager: [phone].

## 2015-09-25 NOTE — Clinical Documentation Improvement (Signed)
  General Surgery Please update your documentation within the medical record to reflect your response to this query. Thank you  Can the diagnosis of "Mild renal insufficiency" be further specified?  Possible/ likely/ suspected/ probable conditions:  Acute Renal Failure/Acute Kidney Injury  Acute Tubular Necrosis  Acute Renal Cortical Necrosis  Acute Renal Medullary Necrosis  Acute on Chronic Renal Failure  Chronic Renal Failure  Other  Clinically Undetermined  Document any associated diagnoses/conditions.  Supporting Information: 09/25/15 progr note..."Mild renal insufficiency-improved"... 09/24/15 progr note..."Mild renal insufficiency-hydrate, AM labs"...  Please exercise your independent, professional judgment when responding. A specific answer is not anticipated or expected.  Thank You,  Ermelinda Das, RN, BSN, [place] Certified Clinical Documentation Specialist [place]: Health Information Management [phone]

## 2015-09-25 NOTE — Progress Notes (Signed)
Asked patient if she would like me to update family or friends about her transfer to ICU, patient replied no. Will continue to monitor.

## 2015-09-25 NOTE — Progress Notes (Signed)
Patient ID: Grace [name], female   DOB: April 19, 1955, 60 y.o.   MRN: [id number]     [facility]      [address] [place], [place] [address]    Phone: [phone] FAX: [phone]     Subjective: Nurse noted neurological changes new this AM.  Started prior to giving dilaudid.  Given diladudid at [place], but no other medication.  Do not see a history of seizure disorder.  Pt unable to answer questions.  BP stable, tachy 140s, sats in high 90s on 3L NS.  WBC normal, sCr improved. H&H are stable.   Objective:  Vital signs:  Filed Vitals:   09/25/15 0750 09/25/15 0758 09/25/15 0800 09/25/15 0858  BP:   94/51   Pulse:   136   Temp:  98.5 F (36.9 C)    TempSrc:  Oral    Resp:   18   Height:      Weight:      SpO2: 100%  95% 99%    Last BM Date: 09/23/15  Intake/Output   Yesterday:  06/12 0701 - 06/13 0700 In: 2967.5 [P.O.:120; I.V.:1757.5; IV Piggyback:1050] Out: 365 [Urine:250; Drains:115] This shift:  Total I/O In: 150 [I.V.:150] Out: 56 [Drains:45]   Physical Exam: General: upon arrival pt, was able to protect airway, was not able to answer any questions.  Moving all extremities. But not following commands.  Narcan given, able to answer questions, moaning "help me mama." Eyes: PERRL, but sluggish.  Chest: wheezes.   CV:  Pulses intact.  Regular rhythm. Tachy.  Abdomen: Soft.  distended.  Mildly tender at incisions only.  JP drain with serosanguinous output. No evidence of peritonitis.  No incarcerated hernias. Ext:  SCDs BLE.  No mjr edema.  No cyanosis Skin: No petechiae / purpura   Problem List:   Active Problems:   Perforated appendicitis    Results:   Labs: Results for orders placed or performed during the hospital encounter of 09/23/15 (from the past 48 hour(s))  Magnesium     Status: None   Collection Time: 09/23/15 11:57 AM  Result Value Ref Range   Magnesium 2.0 1.7 - 2.4 mg/dL  Brain natriuretic  peptide     Status: Abnormal   Collection Time: 09/23/15 11:57 AM  Result Value Ref Range   B Natriuretic Peptide 139.8 (H) 0.0 - 100.0 pg/mL  CBC with Differential     Status: Abnormal   Collection Time: 09/23/15 11:57 AM  Result Value Ref Range   WBC 19.3 (H) 4.0 - 10.5 K/uL   RBC 4.76 3.87 - 5.11 MIL/uL   Hemoglobin 13.5 12.0 - 15.0 g/dL   HCT 42.9 36.0 - 46.0 %   MCV 90.1 78.0 - 100.0 fL   MCH 28.4 26.0 - 34.0 pg   MCHC 31.5 30.0 - 36.0 g/dL   RDW 14.2 11.5 - 15.5 %   Platelets 211 150 - 400 K/uL   Neutrophils Relative % 83 %   Neutro Abs 16.0 (H) 1.7 - 7.7 K/uL   Lymphocytes Relative 6 %   Lymphs Abs 1.1 0.7 - 4.0 K/uL   Monocytes Relative 11 %   Monocytes Absolute 2.1 (H) 0.1 - 1.0 K/uL   Eosinophils Relative 0 %   Eosinophils Absolute 0.0 0.0 - 0.7 K/uL   Basophils Relative 0 %   Basophils Absolute 0.0 0.0 - 0.1 K/uL  Comprehensive metabolic panel     Status: Abnormal   Collection Time:  09/23/15 11:57 AM  Result Value Ref Range   Sodium 136 135 - 145 mmol/L   Potassium 3.6 3.5 - 5.1 mmol/L   Chloride 101 101 - 111 mmol/L   CO2 26 22 - 32 mmol/L   Glucose, Bld 119 (H) 65 - 99 mg/dL   BUN 25 (H) 6 - 20 mg/dL   Creatinine, Ser 1.62 (H) 0.44 - 1.00 mg/dL   Calcium 8.9 8.9 - 10.3 mg/dL   Total Protein 6.1 (L) 6.5 - 8.1 g/dL   Albumin 3.0 (L) 3.5 - 5.0 g/dL   AST 16 15 - 41 U/L   ALT 12 (L) 14 - 54 U/L   Alkaline Phosphatase 78 38 - 126 U/L   Total Bilirubin 0.7 0.3 - 1.2 mg/dL   GFR calc non Af Amer 33 (L) >60 mL/min   GFR calc Af Amer 39 (L) >60 mL/min    Comment: (NOTE) The eGFR has been calculated using the CKD EPI equation. This calculation has not been validated in all clinical situations. eGFR's persistently <60 mL/min signify possible Chronic Kidney Disease.    Anion gap 9 5 - 15  I-Stat Troponin, ED - 0, 3, 6 hours (not at [facility])     Status: None   Collection Time: 09/23/15 12:01 PM  Result Value Ref Range   Troponin i, poc 0.00 0.00 - 0.08 ng/mL    Comment 3            Comment: Due to the release kinetics of cTnI, a negative result within the first hours of the onset of symptoms does not rule out myocardial infarction with certainty. If myocardial infarction is still suspected, repeat the test at appropriate intervals.   Blood culture (routine x 2)     Status: None (Preliminary result)   Collection Time: 09/23/15  3:15 PM  Result Value Ref Range   Specimen Description BLOOD RIGHT HAND    Special Requests BOTTLES DRAWN AEROBIC ONLY 5CC    Culture NO GROWTH < 24 HOURS    Report Status PENDING   Blood culture (routine x 2)     Status: None (Preliminary result)   Collection Time: 09/23/15  3:21 PM  Result Value Ref Range   Specimen Description BLOOD LEFT HAND    Special Requests BOTTLES DRAWN AEROBIC ONLY 5CC    Culture NO GROWTH < 24 HOURS    Report Status PENDING   Glucose, capillary     Status: Abnormal   Collection Time: 09/23/15  5:46 PM  Result Value Ref Range   Glucose-Capillary 121 (H) 65 - 99 mg/dL  Glucose, capillary     Status: Abnormal   Collection Time: 09/23/15  9:08 PM  Result Value Ref Range   Glucose-Capillary 143 (H) 65 - 99 mg/dL  CBC     Status: None   Collection Time: 09/24/15  4:10 AM  Result Value Ref Range   WBC 7.4 4.0 - 10.5 K/uL   RBC 4.37 3.87 - 5.11 MIL/uL   Hemoglobin 12.3 12.0 - 15.0 g/dL   HCT 40.7 36.0 - 46.0 %   MCV 93.1 78.0 - 100.0 fL   MCH 28.1 26.0 - 34.0 pg   MCHC 30.2 30.0 - 36.0 g/dL   RDW 14.6 11.5 - 15.5 %   Platelets 161 150 - 400 K/uL  Basic metabolic panel     Status: Abnormal   Collection Time: 09/24/15  4:10 AM  Result Value Ref Range   Sodium 137 135 - 145 mmol/L  Potassium 4.1 3.5 - 5.1 mmol/L   Chloride 106 101 - 111 mmol/L   CO2 24 22 - 32 mmol/L   Glucose, Bld 211 (H) 65 - 99 mg/dL   BUN 26 (H) 6 - 20 mg/dL   Creatinine, Ser 1.25 (H) 0.44 - 1.00 mg/dL   Calcium 8.6 (L) 8.9 - 10.3 mg/dL   GFR calc non Af Amer 46 (L) >60 mL/min   GFR calc Af Amer 53 (L) >60  mL/min    Comment: (NOTE) The eGFR has been calculated using the CKD EPI equation. This calculation has not been validated in all clinical situations. eGFR's persistently <60 mL/min signify possible Chronic Kidney Disease.    Anion gap 7 5 - 15  MRSA PCR Screening     Status: None   Collection Time: 09/24/15  6:48 AM  Result Value Ref Range   MRSA by PCR NEGATIVE NEGATIVE    Comment:        The GeneXpert MRSA Assay (FDA approved for NASAL specimens only), is one component of a comprehensive MRSA colonization surveillance program. It is not intended to diagnose MRSA infection nor to guide or monitor treatment for MRSA infections. Performed at [facility]   Glucose, capillary     Status: Abnormal   Collection Time: 09/24/15  7:30 AM  Result Value Ref Range   Glucose-Capillary 187 (H) 65 - 99 mg/dL   Comment 1 Notify RN   Glucose, capillary     Status: Abnormal   Collection Time: 09/24/15 11:38 AM  Result Value Ref Range   Glucose-Capillary 168 (H) 65 - 99 mg/dL   Comment 1 Notify RN   Glucose, capillary     Status: Abnormal   Collection Time: 09/24/15  3:52 PM  Result Value Ref Range   Glucose-Capillary 150 (H) 65 - 99 mg/dL   Comment 1 Notify RN   Glucose, capillary     Status: Abnormal   Collection Time: 09/24/15  9:03 PM  Result Value Ref Range   Glucose-Capillary 136 (H) 65 - 99 mg/dL  CBC     Status: Abnormal   Collection Time: 09/25/15  2:45 AM  Result Value Ref Range   WBC 10.3 4.0 - 10.5 K/uL   RBC 4.28 3.87 - 5.11 MIL/uL   Hemoglobin 11.6 (L) 12.0 - 15.0 g/dL   HCT 40.5 36.0 - 46.0 %   MCV 94.6 78.0 - 100.0 fL   MCH 27.1 26.0 - 34.0 pg   MCHC 28.6 (L) 30.0 - 36.0 g/dL   RDW 14.8 11.5 - 15.5 %   Platelets 162 150 - 400 K/uL  Basic metabolic panel     Status: Abnormal   Collection Time: 09/25/15  2:45 AM  Result Value Ref Range   Sodium 136 135 - 145 mmol/L   Potassium 4.7 3.5 - 5.1 mmol/L   Chloride 108 101 - 111 mmol/L   CO2 25 22  - 32 mmol/L   Glucose, Bld 134 (H) 65 - 99 mg/dL   BUN 29 (H) 6 - 20 mg/dL   Creatinine, Ser 1.17 (H) 0.44 - 1.00 mg/dL   Calcium 8.9 8.9 - 10.3 mg/dL   GFR calc non Af Amer 50 (L) >60 mL/min   GFR calc Af Amer 57 (L) >60 mL/min    Comment: (NOTE) The eGFR has been calculated using the CKD EPI equation. This calculation has not been validated in all clinical situations. eGFR's persistently <60 mL/min signify possible Chronic Kidney Disease.  Anion gap 3 (L) 5 - 15  Glucose, capillary     Status: Abnormal   Collection Time: 09/25/15  7:57 AM  Result Value Ref Range   Glucose-Capillary 124 (H) 65 - 99 mg/dL   Comment 1 Notify RN    Comment 2 Document in Chart   Blood gas, arterial     Status: Abnormal   Collection Time: 09/25/15  9:00 AM  Result Value Ref Range   O2 Content 3.0 L/min   Delivery systems NASAL CANNULA    pH, Arterial 7.237 (L) 7.350 - 7.450   pCO2 arterial 56.9 (H) 35.0 - 45.0 mmHg   pO2, Arterial 98.5 80.0 - 100.0 mmHg   Bicarbonate 23.3 20.0 - 24.0 mEq/L   TCO2 25.1 0 - 100 mmol/L   Acid-base deficit 3.1 (H) 0.0 - 2.0 mmol/L   O2 Saturation 97.4 %   Patient temperature 98.6    Collection site RIGHT RADIAL    Drawn by [phone]    Sample type ARTERIAL DRAW    Allens test (pass/fail) PASS PASS    Imaging / Studies: Ct Abdomen Pelvis W Contrast  09/23/2015  CLINICAL DATA:  Lower abdominal pain and nausea starting Friday night EXAM: CT ABDOMEN AND PELVIS WITH CONTRAST TECHNIQUE: Multidetector CT imaging of the abdomen and pelvis was performed using the standard protocol following bolus administration of intravenous contrast. CONTRAST:  51m ISOVUE-300 IOPAMIDOL (ISOVUE-300) INJECTION 61% COMPARISON:  None FINDINGS: Lower chest:  The lung bases are unremarkable. Hepatobiliary: The patient is status postcholecystectomy. Mild intrahepatic biliary ductal dilatation. No focal hepatic mass. Pancreas: Enhanced pancreas is unremarkable. Spleen: Enhanced spleen is  unremarkable. Adrenals/Urinary Tract: Mild thickening of adrenal glands without definite evidence of mass. Mild lobulated renal contour bilaterally. No hydronephrosis or hydroureter. There is a cyst in lower pole of the right kidney measures 1.2 cm. Cyst in lower pole of the left kidney measures 1.5 cm. No hydronephrosis or hydroureter. Delayed renal images shows bilateral renal symmetrical excretion. Bilateral visualized proximal ureter is unremarkable. Stomach/Bowel: No small bowel obstruction. No thickened or dilated small bowel loops. There is inflammatory process in right lower quadrant. There is significant thickening of the appendix up to 2 cm. There is significant periappendiceal stranding and small amount of periappendiceal fluid is noted. At least 2 calcified appendicoliths are noted the largest at the base of the appendix measures 9 mm. Findings are consistent with acute appendicitis. There is no definite evidence of perforation, however micro perforation cannot be excluded. Vascular/Lymphatic: Atherosclerotic calcifications of abdominal aorta and iliac arteries are noted. No aortic aneurysm. Reproductive: Mild enlarged uterus. At least 3 by material fibroids are noted within uterus the largest in right fundal region measures 5.3 cm. Other: There is no evidence of free abdominal air. Small amount of fluid and stranding of the mesenteric fat is noted in right paracolic gutter. Musculoskeletal: No destructive bony lesions are noted. Degenerative changes lumbar spine. Mild degenerative changes bilateral SI joints. Mild degenerative changes pubic symphysis. IMPRESSION: 1. Findings consistent with acute appendicitis as described above. 2. No hydronephrosis or hydroureter. 3. No small bowel obstruction. 4. No hydronephrosis or hydroureter. 5. Mild enlarged uterus with multiple myometrial fibroids the largest measures 5.3 cm in right fundal region. 6. Degenerative changes lumbar spine. These results were called  by telephone at the time of interpretation on 09/23/2015 at 2:46 pm to Dr. DLeo Grosser, who verbally acknowledged these results. Electronically Signed   By: LLahoma CrockerM.D.   On: 09/23/2015 14:48  Dg Abd Acute W/chest  09/23/2015  CLINICAL DATA:  Pt c/o diarrhea/vomiting since Friday. Severe lower abdominal pain today, no appetite. Some mid-chest pain. Smoker 1/2 ppd. Diabetic type 2. Copd. Htn. Hx gallstones. EXAM: DG ABDOMEN ACUTE W/ 1V CHEST COMPARISON:  None. FINDINGS: Frontal view of the chest demonstrates midline trachea. Normal heart size. Atherosclerosis in the transverse aorta. No pleural effusion or pneumothorax. Probable EKG lead artifact projecting over the upper lobes bilaterally. Abdominal films demonstrate minimal small bowel air-fluid levels in the left-sided abdomen. Borderline mid small bowel distension. Cholecystectomy. Convex left lumbar spine curvature. Distal gas and stool. IMPRESSION: Nonspecific minimal left abdominal air-fluid levels and borderline small bowel distension. Question mild adynamic ileus as can be seen with gastroenteritis. Probable EKG lead artifacts projecting over the upper lungs. Consider repeat radiograph with removal of all support apparatus. Electronically Signed   By: Abigail Miyamoto M.D.   On: 09/23/2015 12:38    Medications / Allergies:  Scheduled Meds: . arformoterol  15 mcg Nebulization BID   And  . umeclidinium bromide  1 puff Inhalation Daily  . budesonide (PULMICORT) nebulizer solution  0.5 mg Nebulization Q12H  . enoxaparin (LOVENOX) injection  30 mg Subcutaneous Q24H  . haloperidol lactate      . insulin aspart  0-20 Units Subcutaneous TID WC  . insulin aspart  0-5 Units Subcutaneous QHS  . levothyroxine  175 mcg Oral QAC breakfast  . lisinopril  10 mg Oral Daily  . metoprolol tartrate  25 mg Oral BID  . naloxone      . naLOXone (NARCAN)  injection  0.2 mg Intravenous Once  . piperacillin-tazobactam (ZOSYN)  IV  3.375 g Intravenous Q8H  .  traZODone  100 mg Oral QHS   Continuous Infusions: . dextrose 5 % and 0.45 % NaCl with KCl 20 mEq/L 75 mL/hr at 09/25/15 0800   PRN Meds:.acetaminophen **OR** acetaminophen, fentaNYL (SUBLIMAZE) injection, haloperidol lactate, levalbuterol, LORazepam, methocarbamol, ondansetron **OR** ondansetron (ZOFRAN) IV, oxyCODONE  Antibiotics: Anti-infectives    Start     Dose/Rate Route Frequency Ordered Stop   09/23/15 2015  piperacillin-tazobactam (ZOSYN) IVPB 3.375 g     3.375 g 12.5 mL/hr over 240 Minutes Intravenous Every 8 hours 09/23/15 2009     09/23/15 1500  piperacillin-tazobactam (ZOSYN) IVPB 3.375 g     3.375 g 100 mL/hr over 30 Minutes Intravenous  Once 09/23/15 1445 09/23/15 1610       Assessment/Plan Perforated appendicitis  POD#2 laparoscopic appendectomy--Dr. Grandville Silos -WBC normal, has an ileus, slow to advance, continue drain COPD-change from duoneb to xopenex, co2 52, pH 7.2.  Place on BiPAP.  Obtain a CXR. Pulmonary notified.  Mild renal insufficiency-improved Urinary retention-foley placed.  Neuro-narcan given, improved.  Haldol given.   ID-zosyn D#2 FEN-clears, watch for ileus. Continue IVF. Fentanyl, no diladud VTE prophylaxis-scd, lovenox  Tachycardia-improving  HTN-home meds DM II-SSI/CBGs  Dispo-SDU    Erby Pian, ANP-BC [facility] Pager [phone](7A-4:30P) For consults and floor pages call [phone](7A-4:30P)  09/25/2015 9:29 AM

## 2015-09-25 NOTE — Progress Notes (Signed)
RN attempted to contact Joanne Chars, patient's friend. Voicemail left.

## 2015-09-25 NOTE — Progress Notes (Signed)
eLink Physician-Brief Progress Note Patient Name: Grace [name] DOB: 05-Dec-1955 MRN: [id number]   Date of Service  09/25/2015  HPI/Events of Note  Afib with RVR with HR ~ 130.   eICU Interventions  Cardizem 10 IVP ordered. May need infusion.      Intervention Category Major Interventions: Arrhythmia - evaluation and management  Corydon Schweiss 09/25/2015, 8:16 PM

## 2015-09-25 NOTE — Care Management Important Message (Signed)
Important Message  Patient Details  Name: Grace [name] MRN: [id number] Date of Birth: 1955/10/05   Medicare Important Message Given:  Yes    Loann Quill 09/25/2015, 8:43 AM

## 2015-09-25 NOTE — Progress Notes (Signed)
PCCM Attending:   Patient currently in NSR w/ PACs. She continues to have an elevated HR w/ stable blood pressure. K 4.7 this AM. Checking stat magnesium level. Continue telemetry monitoring. Suspect pain contributing. Holding on Cardizem gtt.  Sonia Baller Ashok Cordia, M.D. [facility] Pulmonary [facility] Critical Care Pager:  [phone] After 3pm or if no response, call [phone] 10:17 PM 09/25/2015

## 2015-09-25 NOTE — Progress Notes (Signed)
Patient belongings sent with her to [id number]. Belongings sent included computer, purse, clothing and cell phone.

## 2015-09-25 NOTE — Progress Notes (Signed)
Patient remains off of NIV at this time. NIV on standby if needed.

## 2015-09-26 ENCOUNTER — Inpatient Hospital Stay (HOSPITAL_COMMUNITY): Payer: Medicare Other

## 2015-09-26 DIAGNOSIS — J9601 Acute respiratory failure with hypoxia: Secondary | ICD-10-CM

## 2015-09-26 DIAGNOSIS — E8770 Fluid overload, unspecified: Secondary | ICD-10-CM

## 2015-09-26 DIAGNOSIS — K353 Acute appendicitis with localized peritonitis: Principal | ICD-10-CM

## 2015-09-26 LAB — BLOOD GAS, ARTERIAL
ACID-BASE EXCESS: 8.7 mmol/L — AB (ref 0.0–2.0)
BICARBONATE: 33.3 meq/L — AB (ref 20.0–24.0)
Delivery systems: POSITIVE
Drawn by: 426241
EXPIRATORY PAP: 8
FIO2: 0.5
INSPIRATORY PAP: 17
O2 SAT: 97.4 %
PATIENT TEMPERATURE: 98.6
PCO2 ART: 51.4 mmHg — AB (ref 35.0–45.0)
PH ART: 7.427 (ref 7.350–7.450)
PO2 ART: 90.9 mmHg (ref 80.0–100.0)
TCO2: 34.8 mmol/L (ref 0–100)

## 2015-09-26 LAB — BASIC METABOLIC PANEL
Anion gap: 7 (ref 5–15)
BUN: 17 mg/dL (ref 6–20)
CALCIUM: 9 mg/dL (ref 8.9–10.3)
CO2: 34 mmol/L — AB (ref 22–32)
CREATININE: 0.94 mg/dL (ref 0.44–1.00)
Chloride: 98 mmol/L — ABNORMAL LOW (ref 101–111)
GFR calc non Af Amer: >60 mL/min (ref >60)
Glucose, Bld: 164 mg/dL — ABNORMAL HIGH (ref 65–99)
Potassium: 3.1 mmol/L — ABNORMAL LOW (ref 3.5–5.1)
Sodium: 139 mmol/L (ref 135–145)

## 2015-09-26 LAB — CBC
HCT: 38.3 % (ref 36.0–46.0)
Hemoglobin: 11.6 g/dL — ABNORMAL LOW (ref 12.0–15.0)
MCH: 27.8 pg (ref 26.0–34.0)
MCHC: 30.3 g/dL (ref 30.0–36.0)
MCV: 91.6 fL (ref 78.0–100.0)
PLATELETS: 156 10*3/uL (ref 150–400)
RBC: 4.18 MIL/uL (ref 3.87–5.11)
RDW: 14.2 % (ref 11.5–15.5)
WBC: 11 10*3/uL — ABNORMAL HIGH (ref 4.0–10.5)

## 2015-09-26 LAB — PHOSPHORUS: PHOSPHORUS: 2.1 mg/dL — AB (ref 2.5–4.6)

## 2015-09-26 LAB — GLUCOSE, CAPILLARY
GLUCOSE-CAPILLARY: 217 mg/dL — AB (ref 65–99)
GLUCOSE-CAPILLARY: 140 mg/dL — AB (ref 65–99)
Glucose-Capillary: 151 mg/dL — ABNORMAL HIGH (ref 65–99)

## 2015-09-26 LAB — MAGNESIUM: Magnesium: 1.9 mg/dL (ref 1.7–2.4)

## 2015-09-26 LAB — PROCALCITONIN: PROCALCITONIN: 6.7 ng/mL

## 2015-09-26 MED ORDER — METOPROLOL TARTRATE 12.5 MG HALF TABLET
12.5000 mg | ORAL_TABLET | Freq: Two times a day (BID) | ORAL | Status: DC
  Administered 2015-09-26: 12.5 mg via ORAL
  Administered 2015-09-26: 12:00:00 via ORAL
  Administered 2015-09-27 – 2015-10-01 (×9): 12.5 mg via ORAL
  Filled 2015-09-26 (×11): qty 1

## 2015-09-26 MED ORDER — AMIODARONE HCL IN DEXTROSE 360-4.14 MG/200ML-% IV SOLN
30.0000 mg/h | INTRAVENOUS | Status: AC
  Administered 2015-09-26: 30 mg/h via INTRAVENOUS
  Filled 2015-09-26: qty 200

## 2015-09-26 MED ORDER — MAGNESIUM SULFATE 2 GM/50ML IV SOLN
2.0000 g | Freq: Once | INTRAVENOUS | Status: AC
  Administered 2015-09-26: 2 g via INTRAVENOUS
  Filled 2015-09-26: qty 50

## 2015-09-26 MED ORDER — POTASSIUM PHOSPHATES 15 MMOLE/5ML IV SOLN
30.0000 mmol | Freq: Once | INTRAVENOUS | Status: AC
  Administered 2015-09-26: 30 mmol via INTRAVENOUS
  Filled 2015-09-26: qty 10

## 2015-09-26 MED ORDER — AMIODARONE HCL IN DEXTROSE 360-4.14 MG/200ML-% IV SOLN
60.0000 mg/h | INTRAVENOUS | Status: AC
  Administered 2015-09-26 (×2): 60 mg/h via INTRAVENOUS
  Filled 2015-09-26: qty 400

## 2015-09-26 MED ORDER — AMIODARONE LOAD VIA INFUSION
150.0000 mg | Freq: Once | INTRAVENOUS | Status: AC
  Administered 2015-09-26: 150 mg via INTRAVENOUS
  Filled 2015-09-26: qty 83.34

## 2015-09-26 NOTE — Procedures (Signed)
Pt is resting comfortably on 3L [place].  BiPAP not needed at this time.

## 2015-09-26 NOTE — Progress Notes (Signed)
CCM MD notified of patient's increased heart rate. New orders received. RN will continue to monitor.

## 2015-09-26 NOTE — Progress Notes (Signed)
eLink Physician-Brief Progress Note Patient Name: Grace [name] DOB: 04-12-1956 MRN: [id number]   Date of Service  09/26/2015  HPI/Events of Note  EKG - AFIB/AFlutter with 2:1 conduction. BP = 104/77 and HR = 152.  eICU Interventions  Will order: 1. Amiodarone IV load and infusion.      Intervention Category Major Interventions: Arrhythmia - evaluation and management  Arie Gable Eugene 09/26/2015, 2:36 AM

## 2015-09-26 NOTE — Progress Notes (Signed)
CCM MD notified of patient's increased and sustaining HR. New orders received and carried out. EKG performed and MD aware of results. RN will continue to monitor.

## 2015-09-26 NOTE — Progress Notes (Signed)
eLink Physician-Brief Progress Note Patient Name: Grace [name] DOB: [birth year] MRN: [id number]   Date of Service  09/26/2015  HPI/Events of Note  Elevated HR - HR = 151. Mg++ = 1.5 and Creatinine = 1.17.  eICU Interventions  Will order: 1. Replete Mg++. 2. 12 Lead EKG now.      Intervention Category Major Interventions: Arrhythmia - evaluation and management  Tenise Stetler Eugene 09/26/2015, 2:20 AM

## 2015-09-26 NOTE — Progress Notes (Addendum)
CCM MD currently on floor and verified orders. New orders received. RN will continue to monitor.

## 2015-09-26 NOTE — Progress Notes (Signed)
PULMONARY / CRITICAL CARE MEDICINE   Name: Grace [name] MRN: [id number] DOB: January 31, 1956    ADMISSION DATE:  09/23/2015 CONSULTATION DATE:  09/23/2015  REFERRING MD:  Dr. Hulen Skains  CHIEF COMPLAINT:  Abdominal Pain   HISTORY OF PRESENT ILLNESS:   This is a 60 y.o. Female with past medical history of COPD, asthma, CVA (2002), tobacco abuse, diabetes mellitus, hypothyroidism, tachycardia, and barrett's esophagus.  She presented to [facility] ER on 6/11 with c/o worsening right lower quadrant abdominal pain with diarrhea onset 48 hrs prior to presentation.  She was found to have leukocytosis of 19,300.  CT scan of the abdomen and pelvis showed acute appendicitis.  On 6/11 pt had an emergent laparoscopic appendectomy.  PCCM consulted on 6/11 due to increased work of breathing and wheezing post extubation requiring Bipap secondary to COPD.  She was transitioned to nasal canula on 6/12.  On 6/13 pt placed back on bipap and transferred to ICU due to acute on chronic hypercarbic respiratory failure secondary to sedating medication and hypotension.  PAST MEDICAL HISTORY :  She  has a past medical history of GERD (gastroesophageal reflux disease); Hyperlipidemia; Hypertension; Migraine; COPD (chronic obstructive pulmonary disease) ([place]); Hypothyroidism; Asthma; Hiatal hernia; Barrett's esophagus; History of CVA (cerebrovascular accident) (10/2000); Endometrial polyp (03/2003); Duodenal ulcer disease; Internal hemorrhoids; History of pineal cyst; Fibroid uterus; Tachycardia; Nevus; H/O failed moderate sedation; Personal history of failed conscious sedation (08/11/2011); Thyroid disease; Chronic back pain; Abnormal vaginal bleeding; Tobacco abuse (Jan 2014); and Diabetes mellitus without complication ([place]).  PAST SURGICAL HISTORY: She  has past surgical history that includes Cholecystectomy; Myomectomy (1990, 1997); Colonoscopy (02/07/2010); Upper gastrointestinal endoscopy (12/18/2009); Hysteroscopy; and  laparoscopic appendectomy (N/A, 09/23/2015).  Allergies  Allergen Reactions  . Codeine     REACTION: headaches  . Prozac [Fluoxetine Hcl] Other (See Comments)    Suicidal ideations    No current facility-administered medications on file prior to encounter.   Current Outpatient Prescriptions on File Prior to Encounter  Medication Sig  . albuterol (PROAIR HFA) 108 (90 BASE) MCG/ACT inhaler Inhale 2 puffs into the lungs every 6 (six) hours as needed for wheezing or shortness of breath.  . Aspirin-Acetaminophen-Caffeine 485-462-70 MG PACK Take 1 Package by mouth as needed.   Marland Kitchen atorvastatin (LIPITOR) 80 MG tablet Take one tablet by mouth once daily at bedtime for cholesterol  . diphenhydrAMINE (BENADRYL) 25 MG tablet Take 25 mg by mouth daily as needed for sleep.   . fluticasone (FLOVENT HFA) 44 MCG/ACT inhaler INHALE TWO PUFFS INTO LUNGS TWICE DAILY  . glipiZIDE (GLUCOTROL XL) 5 MG 24 hr tablet Take one tablet by mouth once daily to control blood sugar  . ibuprofen (ADVIL,MOTRIN) 200 MG tablet Take 600 mg by mouth as needed for moderate pain.   Marland Kitchen Ketotifen Fumarate (REFRESH EYE ITCH RELIEF OP) Place 1 drop into both eyes daily as needed. For itchy eyes  . levothyroxine (SYNTHROID, LEVOTHROID) 175 MCG tablet Take 1 tablet (175 mcg total) by mouth daily before breakfast.  . lisinopril (PRINIVIL,ZESTRIL) 10 MG tablet Take one tablet by mouth once daily for blood pressure  . LORazepam (ATIVAN) 0.5 MG tablet Take one tablet by mouth three times daily as needed for anxiety or nervousness  . Melatonin 10 MG CAPS Take by mouth at bedtime.  . metoprolol tartrate (LOPRESSOR) 25 MG tablet Take 1 tablet (25 mg total) by mouth 2 (two) times daily.  . Multiple Vitamins-Minerals (HAIR/SKIN/NAILS) TABS Take 1 tablet by mouth daily.   Marland Kitchen  omeprazole (PRILOSEC) 20 MG capsule Take 1 capsule (20 mg total) by mouth daily.  . traZODone (DESYREL) 100 MG tablet Take 1 tablet (100 mg total) by mouth at bedtime.  Marland Kitchen  umeclidinium-vilanterol (ANORO ELLIPTA) 62.5-25 MCG/INH AEPB Inhale 1 puff into the lungs daily.  Marland Kitchen VALERIAN ROOT PO Take 1 tablet by mouth at bedtime.   Marland Kitchen glucose blood (ONETOUCH VERIO) test strip Use to self monitor blood glucose twice daily and as directed (E11.9)  . ONETOUCH DELICA LANCETS 30S MISC 1 each by Does not apply route 2 (two) times daily.  Marland Kitchen Respiratory Therapy Supplies (NEBULIZER/TUBING/MOUTHPIECE) KIT 1 nebulizer every 12 hours. DX: J44.9  . [DISCONTINUED] ipratropium (ATROVENT HFA) 17 MCG/ACT inhaler Inhale 2 puffs into the lungs 4 (four) times daily.    FAMILY HISTORY:  Her indicated that her mother is deceased. She indicated that her father is deceased.   SOCIAL HISTORY: She  reports that she has been smoking Cigarettes.  She has a 42 pack-year smoking history. She has never used smokeless tobacco. She reports that she does not drink alcohol or use illicit drugs.  REVIEW OF SYSTEMS:   BiPAP overnight, c/o abdominal pain.  SUBJECTIVE:  Per RN this am pt had period where she stared off and did not follow commands RN notified surgery they assessed pt at bedside pt received 1x dose of dilaudid for pain management. Surgery reassessed pt and administered narcan. Pt later received haldol for agitation. ABG obtained pt noted to to be acidotic therefore pt placed on bipap pt continues to appear restless with moaning.   VITAL SIGNS: BP 89/51 mmHg  Pulse 107  Temp(Src) 99.4 F (37.4 C) (Axillary)  Resp 16  Ht 5' 3"  (1.6 m)  Wt 89 kg (196 lb 3.4 oz)  BMI 34.77 kg/m2  SpO2 89%  LMP 02/27/2014  HEMODYNAMICS: CVP:  [5 mmHg] 5 mmHg  VENTILATOR SETTINGS: Vent Mode:  [-]  FiO2 (%):  [30 %-50 %] 35 %  INTAKE / OUTPUT: I/O last 3 completed shifts: In: 2775.4 [P.O.:120; I.V.:1265.4; Other:40; IV PQZRAQTMA:2633] Out: 8435 [Urine:8325; Drains:110]  PHYSICAL EXAMINATION: General: ill appearing female Neuro:  Follows commands, bilateral pupils 3 mm equal brisk  HEENT:  No  gross abnormalities, MM pink/moist Cardiovascular:  Tachycardia, s1s2, no m/r/g. Distal pulses palpable Lungs:  Bibasilar crackles Abdomen: Soft, diffusely tender in all quadrants, distended, soft, decreased bowel sounds, no palpable organomegaly or masses. Musculoskeletal:  Grossly normal. Skin:  No rash, no cyanosis, or clubbing.  LABS:  BMET  Recent Labs Lab 09/24/15 0410 09/25/15 0245 09/26/15 0529  NA 137 136 139  K 4.1 4.7 3.1*  CL 106 108 98*  CO2 24 25 34*  BUN 26* 29* 17  CREATININE 1.25* 1.17* 0.94  GLUCOSE 211* 134* 164*   Electrolytes  Recent Labs Lab 09/23/15 1157 09/24/15 0410 09/25/15 0245 09/25/15 2228 09/26/15 0529  CALCIUM 8.9 8.6* 8.9  --  9.0  MG 2.0  --   --  1.5* 1.9  PHOS  --   --   --   --  2.1*   CBC  Recent Labs Lab 09/24/15 0410 09/25/15 0245 09/26/15 0529  WBC 7.4 10.3 11.0*  HGB 12.3 11.6* 11.6*  HCT 40.7 40.5 38.3  PLT 161 162 156   Coag's No results for input(s): APTT, INR in the last 168 hours.  Sepsis Markers  Recent Labs Lab 09/25/15 1359 09/25/15 1504 09/25/15 1826 09/26/15 0529  LATICACIDVEN  --  1.0 0.9  --   PROCALCITON  8.63  --   --  6.70   ABG  Recent Labs Lab 09/25/15 0900 09/25/15 1221 09/26/15 0437  PHART 7.237* 7.289* 7.427  PCO2ART 56.9* 52.1* 51.4*  PO2ART 98.5 81.1 90.9   Liver Enzymes  Recent Labs Lab 09/23/15 1157  AST 16  ALT 12*  ALKPHOS 78  BILITOT 0.7  ALBUMIN 3.0*    Cardiac Enzymes No results for input(s): TROPONINI, PROBNP in the last 168 hours.  Glucose  Recent Labs Lab 09/24/15 2103 09/25/15 0757 09/25/15 1212 09/25/15 1740 09/25/15 2144 09/26/15 0827  GLUCAP 136* 124* 137* 129* 134* 151*    Imaging Dg Chest Port 1 View  09/26/2015  CLINICAL DATA:  Respiratory failure. EXAM: PORTABLE CHEST 1 VIEW COMPARISON:  09/25/2015. FINDINGS: Right IJ line stable position. Mediastinum hilar structures normal. Cardiomegaly with normal pulmonary vascularity. Low lung  volumes with mild bibasilar atelectasis and/or infiltrates again noted. Small pleural effusions cannot be excluded. IMPRESSION: 1. Right IJ line stable position. 2. Low lung volumes with bibasilar atelectasis and/or infiltrates. No interim change. Small pleural effusions cannot be excluded. Electronically Signed   By: Marcello Moores  Register   On: 09/26/2015 07:20   Dg Chest Port 1 View  09/25/2015  CLINICAL DATA:  Central line placement today. EXAM: PORTABLE CHEST 1 VIEW COMPARISON:  Single view of the chest earlier today. FINDINGS: A new right IJ catheter is in place with the tip projecting at the mid to lower superior vena cava. No pneumothorax. Right basilar airspace disease is again seen. The left lung is clear. Heart size is normal. IMPRESSION: Right IJ catheter tip projects at the mid to lower superior vena cava. Negative for pneumothorax. No change in right basilar airspace disease. Electronically Signed   By: Inge Rise M.D.   On: 09/25/2015 17:34     STUDIES:  Ct of abdomen 6/11>>Findings consistent with acute appendicitis as described above.  No hydronephrosis or hydroureter. No small bowel obstruction. No hydronephrosis or hydroureter. Mild enlarged uterus with multiple myometrial fibroids the largest measures 5.3 cm in right fundal region.  Degenerative changes lumbar spine.  CULTURES: Blood cultures 6/11>>negative  ANTIBIOTICS: Zosyn 6/11>>  SIGNIFICANT EVENTS: 6/11>> pt admitted with appendicitis taken to OR for emergent appendectomy  6/11>> PCCM consulted due to increased work of breathing and wheezing post extubation after urgent appendectomy pt placed on bipap 6/13>> pt transferred to ICU due to hypotension and acute on chronic hypercarbic respiratory failure  LINES/TUBES: PIV's x2  DISCUSSION: 60 y.o. Female required urgent appendectomy secondary to appendicitis on 6/11.  Transferred to ICU on 6/13 due to hypotension and acute on chronic hypercarbic respiratory failure  secondary to sedation medication  ASSESSMENT / PLAN:  PULMONARY A: Acute on chronic hypercarbic respiratory failure secondary to sedating medication and pulmonary edema COPD Hx: Asthma, Current tobacco abuse  P:   Attempt nasal cannula and change BiPAP to PRN. Monitor high risk for intubation if deteriorates.  Maintain O2 sats >90% Continue scheduled pulmicort nebs and xopenex  Cortisol level 22.1, no stress dose steroids.  CARDIOVASCULAR A:  Tachycardia  Hypotension SVT overnight. P:  Restart PO lopressor. Neo-synephrine to maintain map >65 as needed  Telemetry monitoring  D/C amio after 24 hours.  RENAL A:   Acute renal failure P:   Monitor uop Trend BMP's Replace electrolytes as indicated  GASTROINTESTINAL A:   Appendicitis s/p appendectomy (6/11) Hx: Barrett's esophagus, duodenal ulcer disease P:   Surgery appreciate input Clear liquid diet. Pepcid for PUP  HEMATOLOGIC A:  Hx: abnormal vaginal bleeding  P:  Lovenox for VTE prophylaxis Trend CBC's Monitor for s/sx of bleeding Transfuse for hgb <7  INFECTIOUS A:   Appendicitis s/p appendectomy (6/11) ?Peritonitis  P:   Trend WBC's and monitor fever curve Trend lactic acids and PCT's Continue antibiotic as listed above based on PCT results consider broadening antibiotic coverage  ENDOCRINE A:   Hx: Hypothyroidism and Diabetes Mellitus P:   Continue outpatient synthroid SSI  NEUROLOGIC A:  Lethargy secondary to sedating medications  Abdominal Pain Hx: CVA P:   Avoid sedating medication Frequent reorientation  FAMILY  - Updates: No family at bedside to update  - Inter-disciplinary family meet or Palliative Care meeting due by:  October 02, 2015  The patient is critically ill with multiple organ systems failure and requires high complexity decision making for assessment and support, frequent evaluation and titration of therapies, application of advanced monitoring technologies and  extensive interpretation of multiple databases.   Critical Care Time devoted to patient care services described in this note is  35  Minutes. This time reflects time of care of this signee Dr Jennet Maduro. This critical care time does not reflect procedure time, or teaching time or supervisory time of PA/NP/Med student/Med Resident etc but could involve care discussion time.  Rush Farmer, M.D. [facility] Pulmonary/Critical Care Medicine. Pager: [phone]. After hours pager: [phone].

## 2015-09-26 NOTE — Evaluation (Signed)
Physical Therapy Evaluation Patient Details Name: Grace [name] MRN: [id number] DOB: 1955/11/25 Today's Date: 09/26/2015   History of Present Illness  60 y.o. Female with past medical history of COPD, asthma, CVA (2002), tobacco abuse, diabetes mellitus, hypothyroidism, tachycardia, and barrett's esophagus. She presented to [facility] ER on 6/11 and had an emergent laparoscopic appendectomy. Pt did develop acute on chronic hypercarbic respiratory failure secondary to sedating medication and hypotension post op.  Clinical Impression  Patient demonstrates deficits in functional mobility as indicated below. Will need continued skilled PT to address deficits and maximize function. Will see as indicated and progress as tolerated. At current level of function, anticipate patient may need ST SNF upon acute discharge as patient lives alone and states no available assist.   OF NOTE: Patient with soft BP and symptomatic with mobility. nsg aware. tolerated EOB activity 6 minutes with some dizziness reported, BP soft but stable 100s/60s prior to transfer. Attempted room air for transfer, patient desturated to 86% with HR 110s and + dizziness with BP dropping to 80s/50s      Follow Up Recommendations SNF;Supervision/Assistance - 24 hour    Equipment Recommendations  Rolling walker with 5" wheels    Recommendations for Other Services       Precautions / Restrictions Precautions Precautions: Fall Precaution Comments: JP drain in place      Mobility  Bed Mobility Overal bed mobility: Needs Assistance Bed Mobility: Supine to Sit     Supine to sit: Mod assist;HOB elevated     General bed mobility comments: Moderate assist to come to EOB, VCs for positioning and sequencing. Limited by pain with mobility  Transfers Overall transfer level: Needs assistance Equipment used: 2 person hand held assist Transfers: Sit to/from [facility] Sit to Stand: Mod assist;+2 physical  assistance Stand pivot transfers: Min assist       General transfer comment: Moderate assist to power up to standing from low bed surface, min assist for stability in pivot to chair. patient limited by pain and significant dizziness, BP assessed 80s/50. Reassessed in reclined position 100s/70  Ambulation/Gait             General Gait Details: not tested dur to symptomatic BP  Stairs            Wheelchair Mobility    Modified Rankin (Stroke Patients Only)       Balance Overall balance assessment: No apparent balance deficits (not formally assessed)                                           Pertinent Vitals/Pain Pain Assessment: 0-10 Pain Score: 9  Pain Location: surgical site Pain Descriptors / Indicators: Aching;Discomfort;Grimacing;Guarding;Operative site guarding;Sore Pain Intervention(s): Limited activity within patient's tolerance;Monitored during session;Repositioned    Home Living Family/patient expects to be discharged to:: Private residence Living Arrangements: Alone Available Help at Discharge: Other (Comment) (states no help available) Type of Home: House Home Access: Stairs to enter Entrance Stairs-Rails: Right Entrance Stairs-Number of Steps: 4 Home Layout: One level Home Equipment: None      Prior Function Level of Independence: Independent               Hand Dominance   Dominant Hand: Right    Extremity/Trunk Assessment   Upper Extremity Assessment: Generalized weakness           Lower Extremity Assessment: Generalized  weakness         Communication   Communication: No difficulties  Cognition Arousal/Alertness: Awake/alert Behavior During Therapy: Flat affect Overall Cognitive Status: No family/caregiver present to determine baseline cognitive functioning                      General Comments General comments (skin integrity, edema, etc.): tolerated EOB activity 6 minutes with some  dizziness reported, BP soft but stable 100s/60s prior to transfer. Attempted room air for transfer, patient desturated to 86% with HR 110s and + dizziness with BP dropping to 80s/50s    Exercises General Exercises - Lower Extremity Ankle Circles/Pumps: AROM;Both (1 minute continuous)      Assessment/Plan    PT Assessment Patient needs continued PT services  PT Diagnosis Difficulty walking;Abnormality of gait;Generalized weakness;Acute pain   PT Problem List Decreased strength;Decreased range of motion;Decreased activity tolerance;Decreased balance;Decreased mobility;Decreased safety awareness;Pain  PT Treatment Interventions DME instruction;Gait training;Stair training;Functional mobility training;Therapeutic activities;Therapeutic exercise;Balance training;Patient/family education   PT Goals (Current goals can be found in the Care Plan section) Acute Rehab PT Goals Patient Stated Goal: to get better PT Goal Formulation: With patient Time For Goal Achievement: 10/10/15 Potential to Achieve Goals: Good    Frequency Min 3X/week   Barriers to discharge Decreased caregiver support      Co-evaluation               End of Session Equipment Utilized During Treatment: Oxygen (3 liters) Activity Tolerance: Patient limited by fatigue;Patient limited by pain Patient left: in chair;with call bell/phone within reach;with chair alarm set Nurse Communication: Mobility status         Time: 1406-1430 PT Time Calculation (min) (ACUTE ONLY): 24 min   Charges:   PT Evaluation $PT Eval Moderate Complexity: 1 Procedure PT Treatments $Therapeutic Activity: 8-22 mins   PT G CodesDuncan Dull 2015-10-04, 6:07 PM  Alben Deeds, [place] DPT  [phone]

## 2015-09-26 NOTE — Progress Notes (Signed)
CCS/Grace [name] Progress Note 3 Days Post-Op  Subjective: Patient a bit more alert and awake this AM.  Says that she is hungry.  Objective: Vital signs in last 24 hours: Temp:  [98.4 F (36.9 C)-100 F (37.8 C)] 99.4 F (37.4 C) (06/14 0754) Pulse Rate:  [25-153] 133 (06/14 0600) Resp:  [12-37] 21 (06/14 0600) BP: (88-132)/(46-96) 103/74 mmHg (06/14 0600) SpO2:  [74 %-98 %] 98 % (06/14 0754) FiO2 (%):  [30 %-50 %] 35 % (06/14 0754) Last BM Date: 09/23/15  Intake/Output from previous day: 06/13 0701 - 06/14 0700 In: 740.4 [I.V.:440.4; IV Piggyback:300] Out: [id number] [Urine:8325; Drains:60] Intake/Output this shift:    General: No acute distress.  Somewhat lethargic.  Lungs: clear.  CXR improved from yesterday.  Heavy diureisi yesterday.  Abd: Soft, hypoactive bowel sounds. Tolerating clear liquids okay.  Extremities: No changes  Neuro: Seem still a bit disoriented.   Lab Results:  @LABLAST2 (wbc:2,hgb:2,hct:2,plt:2) BMET ) Recent Labs  09/25/15 0245 09/26/15 0529  NA 136 139  K 4.7 3.1*  CL 108 98*  CO2 25 34*  GLUCOSE 134* 164*  BUN 29* 17  CREATININE 1.17* 0.94  CALCIUM 8.9 9.0   PT/INR No results for input(s): LABPROT, INR in the last 72 hours. ABG  Recent Labs  09/25/15 1221 09/26/15 0437  PHART 7.289* 7.427  HCO3 24.2* 33.3*    Studies/Results: Dg Chest Port 1 View  09/26/2015  CLINICAL DATA:  Respiratory failure. EXAM: PORTABLE CHEST 1 VIEW COMPARISON:  09/25/2015. FINDINGS: Right IJ line stable position. Mediastinum hilar structures normal. Cardiomegaly with normal pulmonary vascularity. Low lung volumes with mild bibasilar atelectasis and/or infiltrates again noted. Small pleural effusions cannot be excluded. IMPRESSION: 1. Right IJ line stable position. 2. Low lung volumes with bibasilar atelectasis and/or infiltrates. No interim change. Small pleural effusions cannot be excluded. Electronically Signed   By: Marcello Moores  Register   On: 09/26/2015 07:20   Dg  Chest Port 1 View  09/25/2015  CLINICAL DATA:  Central line placement today. EXAM: PORTABLE CHEST 1 VIEW COMPARISON:  Single view of the chest earlier today. FINDINGS: A new right IJ catheter is in place with the tip projecting at the mid to lower superior vena cava. No pneumothorax. Right basilar airspace disease is again seen. The left lung is clear. Heart size is normal. IMPRESSION: Right IJ catheter tip projects at the mid to lower superior vena cava. Negative for pneumothorax. No change in right basilar airspace disease. Electronically Signed   By: Inge Rise M.D.   On: 09/25/2015 17:34   Dg Chest Port 1 View  09/25/2015  CLINICAL DATA:  Shortness of Breath EXAM: PORTABLE CHEST 1 VIEW COMPARISON:  September 23, 2015 FINDINGS: There are pleural effusions bilaterally. There is no edema or consolidation. There is patchy atelectasis in the left lower lobe. Heart is upper normal in size with pulmonary vascularity within normal limits. No adenopathy. No bone lesions. IMPRESSION: Pleural effusions bilaterally which appear fairly small. Lung atelectasis left base. Heart upper normal in size. Suspect a degree of pulmonary vascular congestion. Electronically Signed   By: Lowella Grip III M.D.   On: 09/25/2015 09:35    Anti-infectives: Anti-infectives    Start     Dose/Rate Route Frequency Ordered Stop   09/26/15 0600  vancomycin (VANCOCIN) IVPB 750 mg/150 ml premix     750 mg 150 mL/hr over 60 Minutes Intravenous Every 12 hours 09/25/15 1540     09/25/15 1545  vancomycin (VANCOCIN) 1,500 mg in sodium chloride 0.9 %  500 mL IVPB     1,500 mg 250 mL/hr over 120 Minutes Intravenous  Once 09/25/15 1538 09/25/15 1752   09/23/15 2015  piperacillin-tazobactam (ZOSYN) IVPB 3.375 g     3.375 g 12.5 mL/hr over 240 Minutes Intravenous Every 8 hours 09/23/15 2009     09/23/15 1500  piperacillin-tazobactam (ZOSYN) IVPB 3.375 g     3.375 g 100 mL/hr over 30 Minutes Intravenous  Once 09/23/15 1445 09/23/15  1610      Assessment/Plan: s/p Procedure(s): APPENDECTOMY LAPAROSCOPIC Will advance diet if she starts to show improved bowel activity.  LOS: 3 days   Grace [name]. Dahlia Bailiff, MD, FACS [phone] [facility] Surgery 09/26/2015

## 2015-09-27 DIAGNOSIS — E876 Hypokalemia: Secondary | ICD-10-CM

## 2015-09-27 LAB — GLUCOSE, CAPILLARY
GLUCOSE-CAPILLARY: 134 mg/dL — AB (ref 65–99)
Glucose-Capillary: 133 mg/dL — ABNORMAL HIGH (ref 65–99)
Glucose-Capillary: 162 mg/dL — ABNORMAL HIGH (ref 65–99)
Glucose-Capillary: 127 mg/dL — ABNORMAL HIGH (ref 65–99)

## 2015-09-27 LAB — CBC
HEMATOCRIT: 35.7 % — AB (ref 36.0–46.0)
HEMOGLOBIN: 10.7 g/dL — AB (ref 12.0–15.0)
MCH: 27.2 pg (ref 26.0–34.0)
MCHC: 30 g/dL (ref 30.0–36.0)
MCV: 90.6 fL (ref 78.0–100.0)
Platelets: 167 10*3/uL (ref 150–400)
RBC: 3.94 MIL/uL (ref 3.87–5.11)
RDW: 14.1 % (ref 11.5–15.5)
WBC: 12.4 10*3/uL — ABNORMAL HIGH (ref 4.0–10.5)

## 2015-09-27 LAB — BASIC METABOLIC PANEL
Anion gap: 10 (ref 5–15)
BUN: 14 mg/dL (ref 6–20)
CHLORIDE: 93 mmol/L — AB (ref 101–111)
CO2: 33 mmol/L — AB (ref 22–32)
Calcium: 8.5 mg/dL — ABNORMAL LOW (ref 8.9–10.3)
Creatinine, Ser: 0.66 mg/dL (ref 0.44–1.00)
GFR calc non Af Amer: >60 mL/min (ref >60)
Glucose, Bld: 155 mg/dL — ABNORMAL HIGH (ref 65–99)
POTASSIUM: 3.2 mmol/L — AB (ref 3.5–5.1)
SODIUM: 136 mmol/L (ref 135–145)

## 2015-09-27 LAB — PROCALCITONIN: PROCALCITONIN: 3.25 ng/mL

## 2015-09-27 LAB — MAGNESIUM: MAGNESIUM: 1.9 mg/dL (ref 1.7–2.4)

## 2015-09-27 LAB — PHOSPHORUS: PHOSPHORUS: 2.3 mg/dL — AB (ref 2.5–4.6)

## 2015-09-27 MED ORDER — POTASSIUM CHLORIDE CRYS ER 20 MEQ PO TBCR
40.0000 meq | EXTENDED_RELEASE_TABLET | Freq: Three times a day (TID) | ORAL | Status: AC
  Administered 2015-09-27 (×2): 40 meq via ORAL
  Filled 2015-09-27 (×2): qty 2

## 2015-09-27 MED ORDER — MAGNESIUM SULFATE IN D5W 1-5 GM/100ML-% IV SOLN
1.0000 g | Freq: Once | INTRAVENOUS | Status: AC
  Administered 2015-09-27: 1 g via INTRAVENOUS
  Filled 2015-09-27: qty 100

## 2015-09-27 NOTE — Clinical Social Work Note (Signed)
Patient transferred from 67M to 6N. Handoff information provided to 6N CSW.   This CSW signing off.  Dayton Scrape, [place]

## 2015-09-27 NOTE — Progress Notes (Signed)
MD aware of patient's change in cardiac rhythm. No new orders received at this time. RN will continue to monitor.

## 2015-09-27 NOTE — Progress Notes (Signed)
[facility] Progress Note 4 Days Post-Op  Subjective: Patient looks great this AM.  Very appropriate neurologically.  Objective: Vital signs in last 24 hours: Temp:  [97.7 F (36.5 C)-99.4 F (37.4 C)] 97.7 F (36.5 C) (06/15 0400) Pulse Rate:  [46-107] 46 (06/15 0700) Resp:  [13-25] 23 (06/15 0700) BP: (88-144)/(51-91) 138/91 mmHg (06/15 0700) SpO2:  [86 %-100 %] 96 % (06/15 0751) FiO2 (%):  [35 %] 35 % (06/14 0754) Last BM Date: 09/23/15  Intake/Output from previous day: 06/14 0701 - 06/15 0700 In: 703.4 [I.V.:353.4; IV Piggyback:350] Out: [id number] [Urine:1450; Drains:15] Intake/Output this shift:    General: No acute distress.  Says her pain is much better.  Lungs: Clear  Abd: Soft, good bowel sounds., minimally tender  Extremities: No changes  Neuro: Intact  Lab Results:  @LABLAST2 (wbc:2,hgb:2,hct:2,plt:2) BMET ) Recent Labs  09/26/15 0529 09/27/15 0602  NA 139 136  K 3.1* 3.2*  CL 98* 93*  CO2 34* 33*  GLUCOSE 164* 155*  BUN 17 14  CREATININE 0.94 0.66  CALCIUM 9.0 8.5*   PT/INR No results for input(s): LABPROT, INR in the last 72 hours. ABG  Recent Labs  09/25/15 1221 09/26/15 0437  PHART 7.289* 7.427  HCO3 24.2* 33.3*    Studies/Results: Dg Chest Port 1 View  09/26/2015  CLINICAL DATA:  Respiratory failure. EXAM: PORTABLE CHEST 1 VIEW COMPARISON:  09/25/2015. FINDINGS: Right IJ line stable position. Mediastinum hilar structures normal. Cardiomegaly with normal pulmonary vascularity. Low lung volumes with mild bibasilar atelectasis and/or infiltrates again noted. Small pleural effusions cannot be excluded. IMPRESSION: 1. Right IJ line stable position. 2. Low lung volumes with bibasilar atelectasis and/or infiltrates. No interim change. Small pleural effusions cannot be excluded. Electronically Signed   By: Marcello Moores  Register   On: 09/26/2015 07:20   Dg Chest Port 1 View  09/25/2015  CLINICAL DATA:  Central line placement today. EXAM: PORTABLE CHEST 1  VIEW COMPARISON:  Single view of the chest earlier today. FINDINGS: A new right IJ catheter is in place with the tip projecting at the mid to lower superior vena cava. No pneumothorax. Right basilar airspace disease is again seen. The left lung is clear. Heart size is normal. IMPRESSION: Right IJ catheter tip projects at the mid to lower superior vena cava. Negative for pneumothorax. No change in right basilar airspace disease. Electronically Signed   By: Inge Rise M.D.   On: 09/25/2015 17:34   Dg Chest Port 1 View  09/25/2015  CLINICAL DATA:  Shortness of Breath EXAM: PORTABLE CHEST 1 VIEW COMPARISON:  September 23, 2015 FINDINGS: There are pleural effusions bilaterally. There is no edema or consolidation. There is patchy atelectasis in the left lower lobe. Heart is upper normal in size with pulmonary vascularity within normal limits. No adenopathy. No bone lesions. IMPRESSION: Pleural effusions bilaterally which appear fairly small. Lung atelectasis left base. Heart upper normal in size. Suspect a degree of pulmonary vascular congestion. Electronically Signed   By: Lowella Grip III M.D.   On: 09/25/2015 09:35    Anti-infectives: Anti-infectives    Start     Dose/Rate Route Frequency Ordered Stop   09/26/15 0600  vancomycin (VANCOCIN) IVPB 750 mg/150 ml premix     750 mg 150 mL/hr over 60 Minutes Intravenous Every 12 hours 09/25/15 1540     09/25/15 1545  vancomycin (VANCOCIN) 1,500 mg in sodium chloride 0.9 % 500 mL IVPB     1,500 mg 250 mL/hr over 120 Minutes Intravenous  Once 09/25/15  1538 09/25/15 1752   09/23/15 2015  piperacillin-tazobactam (ZOSYN) IVPB 3.375 g     3.375 g 12.5 mL/hr over 240 Minutes Intravenous Every 8 hours 09/23/15 2009     09/23/15 1500  piperacillin-tazobactam (ZOSYN) IVPB 3.375 g     3.375 g 100 mL/hr over 30 Minutes Intravenous  Once 09/23/15 1445 09/23/15 1610      Assessment/Plan: s/p Procedure(s): APPENDECTOMY LAPAROSCOPIC d/c foley Advance  diet Probably transfer from the ICU to tele or SDU  LOS: 4 days   Kathryne Eriksson. Dahlia Bailiff, MD, FACS [phone] [facility] Surgery 09/27/2015

## 2015-09-27 NOTE — Clinical Social Work Placement (Signed)
   CLINICAL SOCIAL WORK PLACEMENT  NOTE  Date:  09/27/2015  Patient Details  Name: Grace [name] MRN: [id number] Date of Birth: [birth year]  Clinical Social Work is seeking post-discharge placement for this patient at the [place] level of care (*CSW will initial, date and re-position this form in  chart as items are completed):  Yes   Patient/family provided with [facility] Work Department's list of facilities offering this level of care within the geographic area requested by the patient (or if unable, by the patient's family).  Yes   Patient/family informed of their freedom to choose among providers that offer the needed level of care, that participate in Medicare, Medicaid or managed care program needed by the patient, have an available bed and are willing to accept the patient.  Yes   Patient/family informed of [facility]'s ownership interest in [facility] and [facility], as well as of the fact that they are under no obligation to receive care at these facilities.  PASRR submitted to EDS on 09/27/15     PASRR number received on 09/27/15     Existing PASRR number confirmed on       FL2 transmitted to all facilities in geographic area requested by pt/family on 09/27/15     FL2 transmitted to all facilities within larger geographic area on       Patient informed that his/her managed care company has contracts with or will negotiate with certain facilities, including the following:            Patient/family informed of bed offers received.  Patient chooses bed at       Physician recommends and patient chooses bed at      Patient to be transferred to   on  .  Patient to be transferred to facility by       Patient family notified on   of transfer.  Name of family member notified:        PHYSICIAN Please sign FL2     Additional Comment:    _______________________________________________ Candie Chroman, LCSW 09/27/2015,  2:32 PM

## 2015-09-27 NOTE — NC FL2 (Signed)
[place] LEVEL OF CARE SCREENING TOOL     IDENTIFICATION  Patient Name: Grace [name] Birthdate: 27-Sep-1955 Sex: female Admission Date (Current Location): 09/23/2015  [facility] [place] Number:  Herbalist and Address:  [facility] [place] [place] [address] [place], [place] [address]      Provider Number: [id number]  Attending Physician Name and Address:  Md Edison Pace, MD  Relative Name and Phone Number:       Current Level of Care: Hospital Recommended Level of Care: [place] Prior Approval Number:    Date Approved/Denied:   PASRR Number: [id number]  Discharge Plan: SNF    Current Diagnoses: Patient Active Problem List   Diagnosis Date Noted  . Acute appendicitis with localized peritonitis   . Acute respiratory failure with hypoxemia ([place])   . Encounter for central line placement   . Perforated appendicitis 09/23/2015  . Type 2 diabetes mellitus without complication ([place]) 123456  . Insomnia 01/13/2015  . Ovarian cyst, right 08/21/2014  . Endometrial polyp 08/21/2014  . Intramural leiomyoma of uterus 08/21/2014  . Postmenopausal bleeding 08/21/2014  . PFO (patent foramen ovale) 08/17/2014  . History of cervical dysplasia 07/31/2014  . Fibroid, uterine 07/31/2014  . History of cervical polypectomy 07/31/2014  . Chest pain 11/22/2013  . Chronic low back pain 11/13/2011  . 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 History of pineal cyst   . Hypothyroidism   . Post-menopause bleeding   . Tobacco abuse 06/12/2011  . Preventative health care 06/12/2011  . History of CVA (cerebrovascular accident) 04/25/2011  . Migraine without aura 01/29/2010  . Hyperlipidemia LDL goal <100 02/12/2009  . COPD 01/11/2009  . Essential hypertension 01/10/2009  . Asthma 01/10/2009  . Barrett's esophagus with dysplasia 05/28/2005    Orientation RESPIRATION BLADDER Height & Weight     Self, Time,  Situation, Place  O2 (Nasal Canula 3 L. Bipap last used on 6/14 early morning.) Continent Weight: 196 lb 3.4 oz (89 kg) Height:  5\' 3"  (160 cm)  BEHAVIORAL SYMPTOMS/MOOD NEUROLOGICAL BOWEL NUTRITION STATUS   (None)  (None) Continent Diet (Soft)  AMBULATORY STATUS COMMUNICATION OF NEEDS Skin   Limited Assist Verbally Surgical wounds (Closed incision abdomen bilateral: 3 ports, 1 drain)                       Personal Care Assistance Level of Assistance  Bathing, Feeding, Dressing Bathing Assistance: Limited assistance Feeding assistance: Independent Dressing Assistance: Limited assistance     Functional Limitations Info  Sight, Hearing, Speech Sight Info: Adequate Hearing Info: Adequate Speech Info: Adequate    SPECIAL CARE FACTORS FREQUENCY  PT (By licensed PT), Blood pressure, Diabetic urine testing     PT Frequency: 5 x week              Contractures Contractures Info: Not present    Additional Factors Info  Code Status, Allergies Code Status Info: Not on file Allergies Info: Codeine, Prozac           Current Medications (09/27/2015):  This is the current hospital active medication list Current Facility-Administered Medications  Medication Dose Route Frequency Provider Last Rate Last Dose  . acetaminophen (TYLENOL) tablet 650 mg  650 mg Oral Q6H PRN Georganna Skeans, MD       Or  . acetaminophen (TYLENOL) suppository 650 mg  650 mg Rectal Q6H PRN Georganna Skeans, MD      .  arformoterol (BROVANA) nebulizer solution 15 mcg  15 mcg Nebulization BID Rozann Lesches, RPH   15 mcg at 09/27/15 0749   And  . umeclidinium bromide (INCRUSE ELLIPTA) 62.5 MCG/INH 1 puff  1 puff Inhalation Daily Rozann Lesches, RPH   1 puff at 09/25/15 0749  . budesonide (PULMICORT) nebulizer solution 0.5 mg  0.5 mg Nebulization Q12H Donita Brooks, NP   0.5 mg at 09/27/15 0751  . enoxaparin (LOVENOX) injection 30 mg  30 mg Subcutaneous Q24H Georganna Skeans, MD   30 mg at 09/27/15 0927  .  fentaNYL (SUBLIMAZE) injection 50 mcg  50 mcg Intravenous Q2H PRN Laverle Hobby, MD   50 mcg at 09/26/15 0610  . insulin aspart (novoLOG) injection 0-20 Units  0-20 Units Subcutaneous TID WC Georganna Skeans, MD   3 Units at 09/27/15 1204  . insulin aspart (novoLOG) injection 0-5 Units  0-5 Units Subcutaneous QHS Georganna Skeans, MD   0 Units at 09/23/15 2200  . levalbuterol (XOPENEX) nebulizer solution 1.25 mg  1.25 mg Nebulization Q6H PRN Emina Riebock, NP      . levothyroxine (SYNTHROID, LEVOTHROID) tablet 175 mcg  175 mcg Oral QAC breakfast Georganna Skeans, MD   175 mcg at 09/27/15 0925  . methocarbamol (ROBAXIN) tablet 500 mg  500 mg Oral Q6H PRN Georganna Skeans, MD   500 mg at 09/27/15 0122  . metoprolol tartrate (LOPRESSOR) tablet 12.5 mg  12.5 mg Oral BID Rush Farmer, MD   12.5 mg at 09/27/15 0926  . ondansetron (ZOFRAN-ODT) disintegrating tablet 4 mg  4 mg Oral Q6H PRN Georganna Skeans, MD       Or  . ondansetron [facility] injection 4 mg  4 mg Intravenous Q6H PRN Georganna Skeans, MD      . oxyCODONE (Oxy IR/ROXICODONE) immediate release tablet 5-10 mg  5-10 mg Oral Q4H PRN Georganna Skeans, MD   5 mg at 09/27/15 0926  . phenylephrine (NEO-SYNEPHRINE) 10 mg in dextrose 5 % 250 mL (0.04 mg/mL) infusion  0-400 mcg/min Intravenous Titrated Awilda Bill, NP   Stopped at 09/25/15 1345  . piperacillin-tazobactam (ZOSYN) IVPB 3.375 g  3.375 g Intravenous Q8H Georganna Skeans, MD   3.375 g at 09/27/15 0601  . potassium chloride SA (K-DUR,KLOR-CON) CR tablet 40 mEq  40 mEq Oral TID Rush Farmer, MD   40 mEq at 09/27/15 1204  . tamsulosin (FLOMAX) capsule 0.4 mg  0.4 mg Oral Daily Emina Riebock, NP   0.4 mg at 09/27/15 0926  . traZODone (DESYREL) tablet 100 mg  100 mg Oral QHS Georganna Skeans, MD   100 mg at 09/26/15 2100     Discharge Medications: Please see discharge summary for a list of discharge medications.  Relevant Imaging Results:  Relevant Lab Results:   Additional  Information SS#: [id number]  Candie Chroman, LCSW

## 2015-09-27 NOTE — Progress Notes (Signed)
PULMONARY / CRITICAL CARE MEDICINE   Name: Grace [name] MRN: [id number] DOB: January 31, 1956    ADMISSION DATE:  09/23/2015 CONSULTATION DATE:  09/23/2015  REFERRING MD:  Dr. Hulen Skains  CHIEF COMPLAINT:  Abdominal Pain   HISTORY OF PRESENT ILLNESS:   This is a 60 y.o. Female with past medical history of COPD, asthma, CVA (2002), tobacco abuse, diabetes mellitus, hypothyroidism, tachycardia, and barrett's esophagus.  She presented to [facility] ER on 6/11 with c/o worsening right lower quadrant abdominal pain with diarrhea onset 48 hrs prior to presentation.  She was found to have leukocytosis of 19,300.  CT scan of the abdomen and pelvis showed acute appendicitis.  On 6/11 pt had an emergent laparoscopic appendectomy.  PCCM consulted on 6/11 due to increased work of breathing and wheezing post extubation requiring Bipap secondary to COPD.  She was transitioned to nasal canula on 6/12.  On 6/13 pt placed back on bipap and transferred to ICU due to acute on chronic hypercarbic respiratory failure secondary to sedating medication and hypotension.  PAST MEDICAL HISTORY :  She  has a past medical history of GERD (gastroesophageal reflux disease); Hyperlipidemia; Hypertension; Migraine; COPD (chronic obstructive pulmonary disease) ([place]); Hypothyroidism; Asthma; Hiatal hernia; Barrett's esophagus; History of CVA (cerebrovascular accident) (10/2000); Endometrial polyp (03/2003); Duodenal ulcer disease; Internal hemorrhoids; History of pineal cyst; Fibroid uterus; Tachycardia; Nevus; H/O failed moderate sedation; Personal history of failed conscious sedation (08/11/2011); Thyroid disease; Chronic back pain; Abnormal vaginal bleeding; Tobacco abuse (Jan 2014); and Diabetes mellitus without complication ([place]).  PAST SURGICAL HISTORY: She  has past surgical history that includes Cholecystectomy; Myomectomy (1990, 1997); Colonoscopy (02/07/2010); Upper gastrointestinal endoscopy (12/18/2009); Hysteroscopy; and  laparoscopic appendectomy (N/A, 09/23/2015).  Allergies  Allergen Reactions  . Codeine     REACTION: headaches  . Prozac [Fluoxetine Hcl] Other (See Comments)    Suicidal ideations    No current facility-administered medications on file prior to encounter.   Current Outpatient Prescriptions on File Prior to Encounter  Medication Sig  . albuterol (PROAIR HFA) 108 (90 BASE) MCG/ACT inhaler Inhale 2 puffs into the lungs every 6 (six) hours as needed for wheezing or shortness of breath.  . Aspirin-Acetaminophen-Caffeine 485-462-70 MG PACK Take 1 Package by mouth as needed.   Marland Kitchen atorvastatin (LIPITOR) 80 MG tablet Take one tablet by mouth once daily at bedtime for cholesterol  . diphenhydrAMINE (BENADRYL) 25 MG tablet Take 25 mg by mouth daily as needed for sleep.   . fluticasone (FLOVENT HFA) 44 MCG/ACT inhaler INHALE TWO PUFFS INTO LUNGS TWICE DAILY  . glipiZIDE (GLUCOTROL XL) 5 MG 24 hr tablet Take one tablet by mouth once daily to control blood sugar  . ibuprofen (ADVIL,MOTRIN) 200 MG tablet Take 600 mg by mouth as needed for moderate pain.   Marland Kitchen Ketotifen Fumarate (REFRESH EYE ITCH RELIEF OP) Place 1 drop into both eyes daily as needed. For itchy eyes  . levothyroxine (SYNTHROID, LEVOTHROID) 175 MCG tablet Take 1 tablet (175 mcg total) by mouth daily before breakfast.  . lisinopril (PRINIVIL,ZESTRIL) 10 MG tablet Take one tablet by mouth once daily for blood pressure  . LORazepam (ATIVAN) 0.5 MG tablet Take one tablet by mouth three times daily as needed for anxiety or nervousness  . Melatonin 10 MG CAPS Take by mouth at bedtime.  . metoprolol tartrate (LOPRESSOR) 25 MG tablet Take 1 tablet (25 mg total) by mouth 2 (two) times daily.  . Multiple Vitamins-Minerals (HAIR/SKIN/NAILS) TABS Take 1 tablet by mouth daily.   Marland Kitchen  omeprazole (PRILOSEC) 20 MG capsule Take 1 capsule (20 mg total) by mouth daily.  . traZODone (DESYREL) 100 MG tablet Take 1 tablet (100 mg total) by mouth at bedtime.  Marland Kitchen  umeclidinium-vilanterol (ANORO ELLIPTA) 62.5-25 MCG/INH AEPB Inhale 1 puff into the lungs daily.  Marland Kitchen VALERIAN ROOT PO Take 1 tablet by mouth at bedtime.   Marland Kitchen glucose blood (ONETOUCH VERIO) test strip Use to self monitor blood glucose twice daily and as directed (E11.9)  . ONETOUCH DELICA LANCETS 69G MISC 1 each by Does not apply route 2 (two) times daily.  Marland Kitchen Respiratory Therapy Supplies (NEBULIZER/TUBING/MOUTHPIECE) KIT 1 nebulizer every 12 hours. DX: J44.9  . [DISCONTINUED] ipratropium (ATROVENT HFA) 17 MCG/ACT inhaler Inhale 2 puffs into the lungs 4 (four) times daily.    FAMILY HISTORY:  Her indicated that her mother is deceased. She indicated that her father is deceased.   SOCIAL HISTORY: She  reports that she has been smoking Cigarettes.  She has a 42 pack-year smoking history. She has never used smokeless tobacco. She reports that she does not drink alcohol or use illicit drugs.  REVIEW OF SYSTEMS:   BiPAP overnight, c/o abdominal pain.  SUBJECTIVE:  Per RN this am pt had period where she stared off and did not follow commands RN notified surgery they assessed pt at bedside pt received 1x dose of dilaudid for pain management. Surgery reassessed pt and administered narcan. Pt later received haldol for agitation. ABG obtained pt noted to to be acidotic therefore pt placed on bipap pt continues to appear restless with moaning.   VITAL SIGNS: BP 138/91 mmHg  Pulse 46  Temp(Src) 97.7 F (36.5 C) (Oral)  Resp 23  Ht _0  (1.6 m)  Wt 89 kg (196 lb 3.4 oz)  BMI 34.77 kg/m2  SpO2 96%  LMP 02/27/2014  HEMODYNAMICS: CVP:  [6 mmHg-67 mmHg] 6 mmHg  VENTILATOR SETTINGS:    INTAKE / OUTPUT: I/O last 3 completed shifts: In: 1243.8 [I.V.:643.8; IV Piggyback:600] Out: 2952 [WUXLK:4401; Drains:20]  PHYSICAL EXAMINATION: General: ill appearing female Neuro:  Follows commands, bilateral pupils 3 mm equal brisk  HEENT:  No gross abnormalities, MM pink/moist Cardiovascular:   Tachycardia, s1s2, no m/r/g. Distal pulses palpable Lungs:  Bibasilar crackles Abdomen: Soft, diffusely tender in all quadrants, distended, soft, decreased bowel sounds, no palpable organomegaly or masses. Musculoskeletal:  Grossly normal. Skin:  No rash, no cyanosis, or clubbing.  LABS:  BMET  Recent Labs Lab 09/25/15 0245 09/26/15 0529 09/27/15 0602  NA 136 139 136  K 4.7 3.1* 3.2*  CL 108 98* 93*  CO2 25 34* 33*  BUN 29* 17 14  CREATININE 1.17* 0.94 0.66  GLUCOSE 134* 164* 155*   Electrolytes  Recent Labs Lab 09/25/15 0245 09/25/15 2228 09/26/15 0529 09/27/15 0602  CALCIUM 8.9  --  9.0 8.5*  MG  --  1.5* 1.9 1.9  PHOS  --   --  2.1* 2.3*   CBC  Recent Labs Lab 09/25/15 0245 09/26/15 0529 09/27/15 0602  WBC 10.3 11.0* 12.4*  HGB 11.6* 11.6* 10.7*  HCT 40.5 38.3 35.7*  PLT 162 156 167   Coag's No results for input(s): APTT, INR in the last 168 hours.  Sepsis Markers  Recent Labs Lab 09/25/15 1359 09/25/15 1504 09/25/15 1826 09/26/15 0529 09/27/15 0602  LATICACIDVEN  --  1.0 0.9  --   --   PROCALCITON 8.63  --   --  6.70 3.25   ABG  Recent Labs Lab 09/25/15 0900  09/25/15 1221 09/26/15 0437  PHART 7.237* 7.289* 7.427  PCO2ART 56.9* 52.1* 51.4*  PO2ART 98.5 81.1 90.9   Liver Enzymes  Recent Labs Lab 09/23/15 1157  AST 16  ALT 12*  ALKPHOS 78  BILITOT 0.7  ALBUMIN 3.0*   Cardiac Enzymes No results for input(s): TROPONINI, PROBNP in the last 168 hours.  Glucose  Recent Labs Lab 09/25/15 1212 09/25/15 1740 09/25/15 2144 09/26/15 0827 09/26/15 1611 09/26/15 2130  GLUCAP 137* 129* 134* 151* 217* 140*   Imaging I reviewed CXR myself, improving edema.  STUDIES:  Ct of abdomen 6/11>>Findings consistent with acute appendicitis as described above.  No hydronephrosis or hydroureter. No small bowel obstruction. No hydronephrosis or hydroureter. Mild enlarged uterus with multiple myometrial fibroids the largest measures 5.3 cm in  right fundal region.  Degenerative changes lumbar spine.  CULTURES: Blood cultures 6/11>>negative  ANTIBIOTICS: Zosyn 6/11>>  SIGNIFICANT EVENTS: 6/11>> pt admitted with appendicitis taken to OR for emergent appendectomy  6/11>> PCCM consulted due to increased work of breathing and wheezing post extubation after urgent appendectomy pt placed on bipap 6/13>> pt transferred to ICU due to hypotension and acute on chronic hypercarbic respiratory failure  LINES/TUBES: PIV's x2  DISCUSSION: 60 y.o. Female required urgent appendectomy secondary to appendicitis on 6/11.  Transferred to ICU on 6/13 due to hypotension and acute on chronic hypercarbic respiratory failure secondary to sedation medication  ASSESSMENT / PLAN:  PULMONARY A: Acute on chronic hypercarbic respiratory failure secondary to sedating medication and pulmonary edema COPD Hx: Asthma, Current tobacco abuse  P:   D/C BiPAP. Maintain O2 sats >90% Continue scheduled bronchodilators. Cortisol level 22.1, no stress dose steroids.  CARDIOVASCULAR A:  Tachycardia  Hypotension SVT overnight. P:  PO lopressor as ordered. D/C neo. Telemetry monitoring  D/C amiodarone.  RENAL A:   Acute renal failure P:   Monitor UOP. Trend BMP's. Replace electrolytes as indicated.  GASTROINTESTINAL A:   Appendicitis s/p appendectomy (6/11) Hx: Barrett's esophagus, duodenal ulcer disease P:   Per CCS. Liquid diet. Pepcid for PUP  HEMATOLOGIC A:   Hx: abnormal vaginal bleeding  P:  Lovenox for VTE prophylaxis Trend CBC's Monitor for s/sx of bleeding Transfuse for hgb <7  INFECTIOUS A:   Appendicitis s/p appendectomy (6/11) ?Peritonitis  P:   Trend WBC's and monitor fever curve PCT improving and cultures negative. D/C vanc, once cultures are final will consider d/c zosyn.  ENDOCRINE A:   Hx: Hypothyroidism and Diabetes Mellitus P:   Continue outpatient synthroid SSI  NEUROLOGIC A:  Lethargy secondary  to sedating medications  Abdominal Pain Hx: CVA P:   Avoid sedating medication  FAMILY  - Updates: Patient updated bedside.  - Inter-disciplinary family meet or Palliative Care meeting due by:  October 02, 2015  Discussed with bedside RN, will transfer to tele and PCCM will sign off, please call back if needed.  Rush Farmer, M.D. [facility] Pulmonary/Critical Care Medicine. Pager: [phone]. After hours pager: [phone].

## 2015-09-27 NOTE — Clinical Social Work Note (Signed)
Clinical Social Work Assessment  Patient Details  Name: Grace [name] MRN: [id number] Date of Birth: 05/09/1955  Date of referral:  09/27/15               Reason for consult:  Facility Placement, Discharge Planning                Permission sought to share information with:  Facility Contact Representative Permission granted to share information::  Yes, Verbal Permission Granted  Name::        Agency::  SNF's  Relationship::     Contact Information:     Housing/Transportation Living arrangements for the past 2 months:  Single Family Home Source of Information:  Patient, Medical Team Patient Interpreter Needed:  None Criminal Activity/Legal Involvement Pertinent to Current Situation/Hospitalization:  No - Comment as needed Significant Relationships:  Friend, Neighbor Lives with:  Self, Pets Do you feel safe going back to the place where you live?  Yes Need for family participation in patient care:  Yes (Comment)  Care giving concerns:  PT recommending SNF once medically stable for discharge.   Social Worker assessment / plan:  CSW met with patient. No supports at bedside. CSW introduced role and explained that discharge planning would be discussed. CSW confirmed that patient was interested in SNF placement. However, when patient found out that she had to stay at the facility, she became hesitant because she has pets at home. She currently has a neighbor that is taking care of them but she is about to go on vacation. CSW offered to call her neighbor but patient said she would do it. Patient gave verbal permission to fax out her information to SNF's in the area. No further concerns. CSW encouraged patient to contact CSW as needed. CSW will continue to follow patient and facilitate discharge to SNF once medically stable, if patient agreeable.  Employment status:  Unemployed Insurance information:  Managed Medicare PT Recommendations:  Skilled Nursing Facility Information / Referral to  community resources:  Skilled Nursing Facility  Patient/Family's Response to care:  Patient was agreeable to SNF before finding out she had to stay at the facility. She is hesitant due to having pets at home and living alone. Her neighbor that is caring for them now is about to go on vacation. Patient does not have many supports. Patient polite and appreciated social work intervention.  Patient/Family's Understanding of and Emotional Response to Diagnosis, Current Treatment, and Prognosis:  Patient knowledgeable of medical interventions and aware of recommendation for SNF placement once medically stable for discharge.  Emotional Assessment Appearance:  Appears stated age Attitude/Demeanor/Rapport:   (Pleasant) Affect (typically observed):  Accepting, Appropriate, Calm, Pleasant Orientation:  Oriented to Self, Oriented to Place, Oriented to  Time, Oriented to Situation Alcohol / Substance use:  Tobacco Use Psych involvement (Current and /or in the community):  No (Comment)  Discharge Needs  Concerns to be addressed:  Care Coordination Readmission within the last 30 days:  No Current discharge risk:  Dependent with Mobility, Lives alone Barriers to Discharge:  No Barriers Identified    C , LCSW 09/27/2015, 2:28 PM  

## 2015-09-27 NOTE — Progress Notes (Signed)
Physical Therapy Treatment Patient Details Name: Grace [name] MRN: [id number] DOB: 1955-10-14 Today's Date: 09/27/2015    History of Present Illness 60 y.o. Female with past medical history of COPD, asthma, CVA (2002), tobacco abuse, diabetes mellitus, hypothyroidism, tachycardia, and barrett's esophagus. She presented to [facility] on 6/11 and had an emergent laparoscopic appendectomy. Pt did develop acute on chronic hypercarbic respiratory failure secondary to sedating medication and hypotension post op.    PT Comments    Patient seen for mobility progression. Ambulated in room and hall.Patient extremely slow with processing and required MAX cues to direct to task. Assist for stability and pacing during ambulation. BP assessed, soft but stable, ambulated on room air with saturations to 87% improved to 95% on 2 liters upon return to room. Overall patient with extremely flat affect compared to previous session. Nag aware. Will continue to see and progress as tolerated.  Follow Up Recommendations  SNF;Supervision/Assistance - 24 hour     Equipment Recommendations  Rolling walker with 5" wheels    Recommendations for Other Services       Precautions / Restrictions Precautions Precautions: Fall Precaution Comments: JP drain in place Restrictions Weight Bearing Restrictions: No    Mobility  Bed Mobility               General bed mobility comments: received in chair  Transfers Overall transfer level: Needs assistance Equipment used: Rolling walker (2 wheeled) Transfers: Sit to/from Stand Sit to Stand: Min assist         General transfer comment: Min assist to power up to standing with VCs for hand placement and safety  Ambulation/Gait Ambulation/Gait assistance: Min assist Ambulation Distance (Feet): 30 Feet Assistive device: Rolling walker (2 wheeled) Gait Pattern/deviations: Step-to pattern;Decreased stride length;Shuffle;Drifts right/left Gait velocity:  significantly decreased Gait velocity interpretation: Below normal speed for age/gender General Gait Details: Max cues for mobility progression and direction to task. Patient extremely slow with gait and stride. Patient does report modest dizziness in standing BP stable. Manual assist for stability and assist for pacing. Multi-modal cues for during ambulation for safety (Ambulation on room air, desaturation to 87%, BP 100s/60s)   Stairs            Wheelchair Mobility    Modified Rankin (Stroke Patients Only)       Balance                                    Cognition Arousal/Alertness: Awake/alert Behavior During Therapy: Flat affect Overall Cognitive Status: Impaired/Different from baseline Area of Impairment: Following commands;Safety/judgement;Awareness;Problem solving       Following Commands: Follows one step commands with increased time Safety/Judgement: Decreased awareness of safety Awareness: Emergent Problem Solving: Slow processing;Decreased initiation;Difficulty sequencing;Requires verbal cues;Requires tactile cues General Comments: Patient extremely slow processing and carrying out tasks, max cues to direct to task    Exercises General Exercises - Lower Extremity Ankle Circles/Pumps: AROM;Both    General Comments        Pertinent Vitals/Pain Pain Assessment: 0-10 Pain Score: 8  Pain Location: surgical site Pain Descriptors / Indicators: Aching;Discomfort;Grimacing;Guarding;Operative site guarding;Sore Pain Intervention(s): Monitored during session;Premedicated before session;Repositioned    Home Living                      Prior Function            PT Goals (current goals can now  be found in the care plan section) Acute Rehab PT Goals Patient Stated Goal: to get better PT Goal Formulation: With patient Time For Goal Achievement: 10/10/15 Potential to Achieve Goals: Good    Frequency  Min 3X/week    PT Plan Current  plan remains appropriate    Co-evaluation             End of Session Equipment Utilized During Treatment: Oxygen Activity Tolerance: Patient limited by fatigue;Patient limited by pain Patient left: in chair;with call bell/phone within reach;with chair alarm set     Time: [id number] PT Time Calculation (min) (ACUTE ONLY): 25 min  Charges:  $Gait Training: 8-22 mins $Therapeutic Activity: 8-22 mins                    G CodesDuncan Dull October 21, 2015, 1:44 PM Alben Deeds, [place] DPT  [phone]

## 2015-09-28 LAB — BASIC METABOLIC PANEL
Anion gap: 8 (ref 5–15)
BUN: 13 mg/dL (ref 6–20)
CHLORIDE: 98 mmol/L — AB (ref 101–111)
CO2: 34 mmol/L — AB (ref 22–32)
CREATININE: 0.85 mg/dL (ref 0.44–1.00)
Calcium: 8.9 mg/dL (ref 8.9–10.3)
GFR calc Af Amer: >60 mL/min (ref >60)
GFR calc non Af Amer: >60 mL/min (ref >60)
Glucose, Bld: 117 mg/dL — ABNORMAL HIGH (ref 65–99)
Potassium: 4 mmol/L (ref 3.5–5.1)
SODIUM: 140 mmol/L (ref 135–145)

## 2015-09-28 LAB — CULTURE, BLOOD (ROUTINE X 2)
CULTURE: NO GROWTH
Culture: NO GROWTH

## 2015-09-28 LAB — GLUCOSE, CAPILLARY
GLUCOSE-CAPILLARY: 138 mg/dL — AB (ref 65–99)
Glucose-Capillary: 140 mg/dL — ABNORMAL HIGH (ref 65–99)
Glucose-Capillary: 130 mg/dL — ABNORMAL HIGH (ref 65–99)
Glucose-Capillary: 152 mg/dL — ABNORMAL HIGH (ref 65–99)

## 2015-09-28 LAB — CBC
HEMATOCRIT: 37.4 % (ref 36.0–46.0)
HEMOGLOBIN: 11.3 g/dL — AB (ref 12.0–15.0)
MCH: 27.6 pg (ref 26.0–34.0)
MCHC: 30.2 g/dL (ref 30.0–36.0)
MCV: 91.4 fL (ref 78.0–100.0)
Platelets: 198 10*3/uL (ref 150–400)
RBC: 4.09 MIL/uL (ref 3.87–5.11)
RDW: 14.3 % (ref 11.5–15.5)
WBC: 12.3 10*3/uL — ABNORMAL HIGH (ref 4.0–10.5)

## 2015-09-28 LAB — PHOSPHORUS: Phosphorus: 2.6 mg/dL (ref 2.5–4.6)

## 2015-09-28 LAB — MAGNESIUM: Magnesium: 1.7 mg/dL (ref 1.7–2.4)

## 2015-09-28 MED ORDER — DOCUSATE SODIUM 100 MG PO CAPS
100.0000 mg | ORAL_CAPSULE | Freq: Two times a day (BID) | ORAL | Status: DC
  Administered 2015-09-28 – 2015-10-01 (×7): 100 mg via ORAL
  Filled 2015-09-28 (×7): qty 1

## 2015-09-28 MED ORDER — POLYETHYLENE GLYCOL 3350 17 G PO PACK
17.0000 g | PACK | Freq: Every day | ORAL | Status: DC
  Administered 2015-09-28 – 2015-10-01 (×4): 17 g via ORAL
  Filled 2015-09-28 (×5): qty 1

## 2015-09-28 MED ORDER — ENOXAPARIN SODIUM 40 MG/0.4ML ~~LOC~~ SOLN
40.0000 mg | SUBCUTANEOUS | Status: DC
  Administered 2015-09-29 – 2015-10-01 (×3): 40 mg via SUBCUTANEOUS
  Filled 2015-09-28 (×3): qty 0.4

## 2015-09-28 NOTE — Progress Notes (Signed)
Patient ID: Grace [name], female   DOB: March 07, 1956, 60 y.o.   MRN: [id number]     [facility]      [address] [place], [place] [address]    Phone: [phone] FAX: [phone]     Subjective: Pain controlled with PO pain meds, voiding, passing flatus, but no bm since before surgery.  Wbc stable, down.  Tachy resolved.  BP Stable.  Afebrile.  Ambulating.  70m drain output 24h.    Objective:  Vital signs:  Filed Vitals:   09/27/15 1300 09/27/15 1956 09/27/15 2105 09/28/15 0546  BP: 128/72  110/60 142/81  Pulse: 92  83 99  Temp: 98.3 F (36.8 C)  99.1 F (37.3 C) 99.5 F (37.5 C)  TempSrc: Oral  Oral Oral  Resp: _0 Height:      Weight:      SpO2: 98% 95% 94% 95%    Last BM Date: 09/23/15  Intake/Output   Yesterday:  06/15 0701 - 06/16 0700 In: 220 [P.O.:220] Out: 255 [Urine:250; Drains:5] This shift:  Total I/O In: -  Out: 400 [Urine:400]   Physical Exam: General: Pt awake/alert/oriented x4 in no acute distress Chest: cta.  No chest wall pain w good excursion CV:  Pulses intact.  Regular rhythm Abdomen: Soft.  Nondistended.  Mildly tender at incisions only. JP drain with serous output.  No evidence of peritonitis.  No incarcerated hernias. Ext:  SCDs BLE.  No mjr edema.  No cyanosis Skin: No petechiae / purpura   Problem List:   Active Problems:   Perforated appendicitis   Acute appendicitis with localized peritonitis   Acute respiratory failure with hypoxemia ([place]   Encounter for central line placement    Results:   Labs: Results for orders placed or performed during the hospital encounter of 09/23/15 (from the past 48 hour(s))  Glucose, capillary     Status: Abnormal   Collection Time: 09/26/15  4:11 PM  Result Value Ref Range   Glucose-Capillary 217 (H) 65 - 99 mg/dL  Glucose, capillary     Status: Abnormal   Collection Time: 09/26/15  9:30 PM  Result Value Ref Range   Glucose-Capillary 140 (H) 65 - 99 mg/dL  Procalcitonin     Status: None   Collection Time: 09/27/15  6:02 AM  Result Value Ref Range   Procalcitonin 3.25 ng/mL    Comment:        Interpretation: PCT > 2 ng/mL: Systemic infection (sepsis) is likely, unless other causes are known. (NOTE)         ICU PCT Algorithm               Non ICU PCT Algorithm    ----------------------------     ------------------------------         PCT < 0.25 ng/mL                 PCT < 0.1 ng/mL     Stopping of antibiotics            Stopping of antibiotics       strongly encouraged.               strongly encouraged.    ----------------------------     ------------------------------       PCT level decrease by               PCT < 0.25 ng/mL       >=  80% from peak PCT       OR PCT 0.25 - 0.5 ng/mL          Stopping of antibiotics                                             encouraged.     Stopping of antibiotics           encouraged.    ----------------------------     ------------------------------       PCT level decrease by              PCT >= 0.25 ng/mL       < 80% from peak PCT        AND PCT >= 0.5 ng/mL            Continuing antibiotics                                               encouraged.       Continuing antibiotics            encouraged.    ----------------------------     ------------------------------     PCT level increase compared          PCT > 0.5 ng/mL         with peak PCT AND          PCT >= 0.5 ng/mL             Escalation of antibiotics                                          strongly encouraged.      Escalation of antibiotics        strongly encouraged.   CBC     Status: Abnormal   Collection Time: 09/27/15  6:02 AM  Result Value Ref Range   WBC 12.4 (H) 4.0 - 10.5 K/uL   RBC 3.94 3.87 - 5.11 MIL/uL   Hemoglobin 10.7 (L) 12.0 - 15.0 g/dL   HCT 35.7 (L) 36.0 - 46.0 %   MCV 90.6 78.0 - 100.0 fL   MCH 27.2 26.0 - 34.0 pg   MCHC 30.0 30.0 - 36.0 g/dL   RDW 14.1 11.5 - 15.5  %   Platelets 167 150 - 400 K/uL  Basic metabolic panel     Status: Abnormal   Collection Time: 09/27/15  6:02 AM  Result Value Ref Range   Sodium 136 135 - 145 mmol/L   Potassium 3.2 (L) 3.5 - 5.1 mmol/L   Chloride 93 (L) 101 - 111 mmol/L   CO2 33 (H) 22 - 32 mmol/L   Glucose, Bld 155 (H) 65 - 99 mg/dL   BUN 14 6 - 20 mg/dL   Creatinine, Ser 0.66 0.44 - 1.00 mg/dL   Calcium 8.5 (L) 8.9 - 10.3 mg/dL   GFR calc non Af Amer >60 >60 mL/min   GFR calc Af Amer >60 >60 mL/min    Comment: (NOTE) The eGFR has been calculated using the CKD EPI equation. This calculation has not been validated in all clinical situations. eGFR's persistently <60 mL/min  signify possible Chronic Kidney Disease.    Anion gap 10 5 - 15  Magnesium     Status: None   Collection Time: 09/27/15  6:02 AM  Result Value Ref Range   Magnesium 1.9 1.7 - 2.4 mg/dL  Phosphorus     Status: Abnormal   Collection Time: 09/27/15  6:02 AM  Result Value Ref Range   Phosphorus 2.3 (L) 2.5 - 4.6 mg/dL  Glucose, capillary     Status: Abnormal   Collection Time: 09/27/15  8:09 AM  Result Value Ref Range   Glucose-Capillary 133 (H) 65 - 99 mg/dL  Glucose, capillary     Status: Abnormal   Collection Time: 09/27/15 11:56 AM  Result Value Ref Range   Glucose-Capillary 134 (H) 65 - 99 mg/dL   Comment 1 Notify RN    Comment 2 Document in Chart   Glucose, capillary     Status: Abnormal   Collection Time: 09/27/15  5:31 PM  Result Value Ref Range   Glucose-Capillary 162 (H) 65 - 99 mg/dL  Glucose, capillary     Status: Abnormal   Collection Time: 09/27/15  8:55 PM  Result Value Ref Range   Glucose-Capillary 127 (H) 65 - 99 mg/dL   Comment 1 Notify RN    Comment 2 Document in Chart   CBC     Status: Abnormal   Collection Time: 09/28/15  6:30 AM  Result Value Ref Range   WBC 12.3 (H) 4.0 - 10.5 K/uL   RBC 4.09 3.87 - 5.11 MIL/uL   Hemoglobin 11.3 (L) 12.0 - 15.0 g/dL   HCT 37.4 36.0 - 46.0 %   MCV 91.4 78.0 - 100.0 fL    MCH 27.6 26.0 - 34.0 pg   MCHC 30.2 30.0 - 36.0 g/dL   RDW 14.3 11.5 - 15.5 %   Platelets 198 150 - 400 K/uL  Basic metabolic panel     Status: Abnormal   Collection Time: 09/28/15  6:30 AM  Result Value Ref Range   Sodium 140 135 - 145 mmol/L   Potassium 4.0 3.5 - 5.1 mmol/L    Comment: DELTA CHECK NOTED NO VISIBLE HEMOLYSIS    Chloride 98 (L) 101 - 111 mmol/L   CO2 34 (H) 22 - 32 mmol/L   Glucose, Bld 117 (H) 65 - 99 mg/dL   BUN 13 6 - 20 mg/dL   Creatinine, Ser 0.85 0.44 - 1.00 mg/dL   Calcium 8.9 8.9 - 10.3 mg/dL   GFR calc non Af Amer >60 >60 mL/min   GFR calc Af Amer >60 >60 mL/min    Comment: (NOTE) The eGFR has been calculated using the CKD EPI equation. This calculation has not been validated in all clinical situations. eGFR's persistently <60 mL/min signify possible Chronic Kidney Disease.    Anion gap 8 5 - 15  Magnesium     Status: None   Collection Time: 09/28/15  6:30 AM  Result Value Ref Range   Magnesium 1.7 1.7 - 2.4 mg/dL  Phosphorus     Status: None   Collection Time: 09/28/15  6:30 AM  Result Value Ref Range   Phosphorus 2.6 2.5 - 4.6 mg/dL  Glucose, capillary     Status: Abnormal   Collection Time: 09/28/15  7:44 AM  Result Value Ref Range   Glucose-Capillary 140 (H) 65 - 99 mg/dL   Comment 1 Notify RN     Imaging / Studies: No results found.  Medications / Allergies:  Scheduled Meds: .  arformoterol  15 mcg Nebulization BID   And  . umeclidinium bromide  1 puff Inhalation Daily  . budesonide (PULMICORT) nebulizer solution  0.5 mg Nebulization Q12H  . docusate sodium  100 mg Oral BID  . enoxaparin (LOVENOX) injection  30 mg Subcutaneous Q24H  . insulin aspart  0-20 Units Subcutaneous TID WC  . insulin aspart  0-5 Units Subcutaneous QHS  . levothyroxine  175 mcg Oral QAC breakfast  . metoprolol tartrate  12.5 mg Oral BID  . piperacillin-tazobactam (ZOSYN)  IV  3.375 g Intravenous Q8H  . polyethylene glycol  17 g Oral Daily  . tamsulosin   0.4 mg Oral Daily  . traZODone  100 mg Oral QHS   Continuous Infusions:  PRN Meds:.acetaminophen **OR** acetaminophen, fentaNYL (SUBLIMAZE) injection, levalbuterol, methocarbamol, ondansetron **OR** ondansetron (ZOFRAN) IV, oxyCODONE  Antibiotics: Anti-infectives    Start     Dose/Rate Route Frequency Ordered Stop   09/26/15 0600  vancomycin (VANCOCIN) IVPB 750 mg/150 ml premix  Status:  Discontinued     750 mg 150 mL/hr over 60 Minutes Intravenous Every 12 hours 09/25/15 1540 09/27/15 0928   09/25/15 1545  vancomycin (VANCOCIN) 1,500 mg in sodium chloride 0.9 % 500 mL IVPB     1,500 mg 250 mL/hr over 120 Minutes Intravenous  Once 09/25/15 1538 09/25/15 1752   09/23/15 2015  piperacillin-tazobactam (ZOSYN) IVPB 3.375 g     3.375 g 12.5 mL/hr over 240 Minutes Intravenous Every 8 hours 09/23/15 2009     09/23/15 1500  piperacillin-tazobactam (ZOSYN) IVPB 3.375 g     3.375 g 100 mL/hr over 30 Minutes Intravenous  Once 09/23/15 1445 09/23/15 1610       Assessment/Plan Perforated appendicitis  POD#5 laparoscopic appendectomy--Dr. Grandville Silos -WBC trending down, afebrile, drain is serous -await bowel function  -continue with drain, will consider removal prior to DC  COPD-stable, wean 02  Mild renal insufficiency-resolved Urinary retention-resolved   Mental status changes-2/2 dilaudid, resolved ID-zosyn D#5 FEN-no issues.  Add miralax/colace VTE prophylaxis-scd, lovenox  Tachycardia/hypotension-resolved with IVF, lopressor  HTN-home meds DM II-SSI/CBGs  Dispo-SNF v 24h assist, wishes to go home, has a friend who will stay with her.  Possible DC later today or tomorrow    Erby Pian, [facility] Pager [phone]) For consults and floor pages call [phone](7A-4:30P)  09/28/2015 9:10 AM

## 2015-09-28 NOTE — Care Management Important Message (Signed)
Important Message  Patient Details  Name: Grace [name] MRN: [id number] Date of Birth: March 13, 1956   Medicare Important Message Given:  Yes    Loann Quill 09/28/2015, 9:35 AM

## 2015-09-28 NOTE — Care Management Note (Signed)
Case Management Note  Patient Details  Name: Grace [name] MRN: [id number] Date of Birth: 1956/02/12  Subjective/Objective:                    Action/Plan:  Confirmed face sheet information with patient . Discussed PT recommendations with patient .  Patient is aware recommendation is for short term rehab , however, she wants to go home , she has friends who can stay with her.   She has a walker at home already. Expected Discharge Date:                  Expected Discharge Plan:  [place]  In-House Referral:     Discharge planning Services  CM Consult  Post Acute Care Choice:  Home Health Choice offered to:  Patient  DME Arranged:    DME Agency:     HH Arranged:  RN, PT, OT HH Agency:  [facility]  Status of Service:  Completed, signed off  Medicare Important Message Given:  Yes Date Medicare IM Given:    Medicare IM give by:    Date Additional Medicare IM Given:    Additional Medicare Important Message give by:     If discussed at [place] of Stay Meetings, dates discussed:    Additional Comments:  Marilu Favre, RN 09/28/2015, 1:55 PM

## 2015-09-28 NOTE — Progress Notes (Signed)
Pt. Refused to get linen changed

## 2015-09-28 NOTE — Progress Notes (Signed)
Physical Therapy Treatment Patient Details Name: Grace [name] MRN: [id number] DOB: 1955/12/17 Today's Date: 09/28/2015    History of Present Illness 60 y.o. Female with past medical history of COPD, asthma, CVA (2002), tobacco abuse, diabetes mellitus, hypothyroidism, tachycardia, and barrett's esophagus. She presented to [facility] on 6/11 and had an emergent laparoscopic appendectomy. Pt did develop acute on chronic hypercarbic respiratory failure secondary to sedating medication and hypotension post op.    PT Comments    Pt performed increased gait distance, remains to present with DOE but much improved from previous session.    Follow Up Recommendations  SNF;Supervision/Assistance - 24 hour     Equipment Recommendations  Rolling walker with 5" wheels    Recommendations for Other Services       Precautions / Restrictions Precautions Precautions: Fall Precaution Comments: JP drain in place Restrictions Weight Bearing Restrictions: No    Mobility  Bed Mobility               General bed mobility comments: Pt recieved in bathroom on [facility] with CNA present.    Transfers Overall transfer level: Needs assistance Equipment used: Rolling walker (2 wheeled) Transfers: Sit to/from Stand Sit to Stand: Supervision Stand pivot transfers: Supervision       General transfer comment: Cues for safety and hand placement.    Ambulation/Gait Ambulation/Gait assistance: Min assist Ambulation Distance (Feet): 300 Feet Assistive device: Rolling walker (2 wheeled) Gait Pattern/deviations: Step-through pattern;Decreased stride length;Trunk flexed   Gait velocity interpretation: Below normal speed for age/gender General Gait Details: Cues for upper trunk control, increasing B stride length and endurance.  2/4 DOE.  O2 sats 87%-92% on RA.     Stairs            Wheelchair Mobility    Modified Rankin (Stroke Patients Only)       Balance                                     Cognition Arousal/Alertness: Awake/alert Behavior During Therapy: WFL for tasks assessed/performed Overall Cognitive Status: Within Functional Limits for tasks assessed                      Exercises      General Comments        Pertinent Vitals/Pain Pain Assessment: 0-10 Pain Score: 3  Pain Descriptors / Indicators: Discomfort Pain Intervention(s): Monitored during session;Repositioned    Home Living                      Prior Function            PT Goals (current goals can now be found in the care plan section) Acute Rehab PT Goals Patient Stated Goal: to get better Potential to Achieve Goals: Good Progress towards PT goals: Progressing toward goals    Frequency  Min 3X/week    PT Plan Current plan remains appropriate    Co-evaluation             End of Session   Activity Tolerance: Patient limited by fatigue;Patient limited by pain Patient left: in chair;with call bell/phone within reach;with chair alarm set     Time: [id number] PT Time Calculation (min) (ACUTE ONLY): 29 min  Charges:  $Gait Training: 8-22 mins $Therapeutic Activity: 8-22 mins  G Codes:      Cristela Blue 09/28/2015, 12:14 PM Governor Rooks, PTA pager [phone]

## 2015-09-29 LAB — GLUCOSE, CAPILLARY
GLUCOSE-CAPILLARY: 111 mg/dL — AB (ref 65–99)
GLUCOSE-CAPILLARY: 129 mg/dL — AB (ref 65–99)
GLUCOSE-CAPILLARY: 108 mg/dL — AB (ref 65–99)
GLUCOSE-CAPILLARY: 146 mg/dL — AB (ref 65–99)

## 2015-09-29 MED ORDER — AMOXICILLIN-POT CLAVULANATE 500-125 MG PO TABS
1.0000 | ORAL_TABLET | Freq: Three times a day (TID) | ORAL | Status: DC
  Administered 2015-09-29 – 2015-10-01 (×8): 500 mg via ORAL
  Filled 2015-09-29 (×8): qty 1

## 2015-09-29 MED ORDER — MAGNESIUM HYDROXIDE 400 MG/5ML PO SUSP
30.0000 mL | Freq: Once | ORAL | Status: AC
Start: 2015-09-29 — End: 2015-09-29
  Administered 2015-09-29: 30 mL via ORAL
  Filled 2015-09-29: qty 30

## 2015-09-29 NOTE — Progress Notes (Signed)
6 Days Post-Op  Subjective: Some SOB with ambulating this AM Tol PO  Objective: Vital signs in last 24 hours: Temp:  [98.6 F (37 C)-99.1 F (37.3 C)] 98.8 F (37.1 C) (06/17 0432) Pulse Rate:  [75-91] 88 (06/17 0432) Resp:  [17-20] 20 (06/17 0432) BP: (119-141)/(71-83) 119/71 mmHg (06/17 0432) SpO2:  [92 %-98 %] 92 % (06/17 0432) FiO2 (%):  [32 %] 32 % (06/16 0900) Last BM Date: 09/28/15  Intake/Output from previous day: 06/16 0701 - 06/17 0700 In: 610 [P.O.:360; IV Piggyback:250] Out: 1560 [Urine:1550; Drains:10] Intake/Output this shift:    General appearance: alert and cooperative GI: soft, non-tender; bowel sounds normal; no masses,  no organomegaly and drain SS  Lab Results:   Recent Labs  09/27/15 0602 09/28/15 0630  WBC 12.4* 12.3*  HGB 10.7* 11.3*  HCT 35.7* 37.4  PLT 167 198   BMET  Recent Labs  09/27/15 0602 09/28/15 0630  NA 136 140  K 3.2* 4.0  CL 93* 98*  CO2 33* 34*  GLUCOSE 155* 117*  BUN 14 13  CREATININE 0.66 0.85  CALCIUM 8.5* 8.9    Anti-infectives: Anti-infectives    Start     Dose/Rate Route Frequency Ordered Stop   09/26/15 0600  vancomycin (VANCOCIN) IVPB 750 mg/150 ml premix  Status:  Discontinued     750 mg 150 mL/hr over 60 Minutes Intravenous Every 12 hours 09/25/15 1540 09/27/15 0928   09/25/15 1545  vancomycin (VANCOCIN) 1,500 mg in sodium chloride 0.9 % 500 mL IVPB     1,500 mg 250 mL/hr over 120 Minutes Intravenous  Once 09/25/15 1538 09/25/15 1752   09/23/15 2015  piperacillin-tazobactam (ZOSYN) IVPB 3.375 g     3.375 g 12.5 mL/hr over 240 Minutes Intravenous Every 8 hours 09/23/15 2009     09/23/15 1500  piperacillin-tazobactam (ZOSYN) IVPB 3.375 g     3.375 g 100 mL/hr over 30 Minutes Intravenous  Once 09/23/15 1445 09/23/15 1610      Assessment/Plan: s/p Procedure(s): APPENDECTOMY LAPAROSCOPIC (N/A) Advance diet to low fat DC DVL Change abx to PO Wean O2 as tol  MOM for BM  LOS: 6 days    Rosario Jacks., Anne Hahn 09/29/2015

## 2015-09-30 LAB — CREATININE, SERUM
CREATININE: 0.75 mg/dL (ref 0.44–1.00)
GFR calc non Af Amer: >60 mL/min (ref >60)

## 2015-09-30 LAB — GLUCOSE, CAPILLARY
GLUCOSE-CAPILLARY: 121 mg/dL — AB (ref 65–99)
GLUCOSE-CAPILLARY: 199 mg/dL — AB (ref 65–99)
Glucose-Capillary: 199 mg/dL — ABNORMAL HIGH (ref 65–99)

## 2015-09-30 NOTE — Clinical Social Work Note (Signed)
CSW met with pt at bedside and provided SNF bed offers. Pt requested [facility] for SNF upon discharge because [facility] is closer to her home.

## 2015-09-30 NOTE — Progress Notes (Signed)
7 Days Post-Op  Subjective: Pt with some SOB while walking to BR. Still on 3L BNC  Objective: Vital signs in last 24 hours: Temp:  [98.6 F (37 C)-99.2 F (37.3 C)] 98.6 F (37 C) (06/18 0550) Pulse Rate:  [84-98] 84 (06/18 0550) Resp:  [18] 18 (06/18 0550) BP: (96-124)/(54-67) 124/62 mmHg (06/18 0550) SpO2:  [93 %-97 %] 94 % (06/18 0550) Last BM Date: 09/28/15  Intake/Output from previous day: 06/17 0701 - 06/18 0700 In: 300 [P.O.:300] Out: 405 [Urine:400; Drains:5] Intake/Output this shift:    General appearance: alert and cooperative Cardio: regular rate and rhythm, S1, S2 normal, no murmur, click, rub or gallop GI: soft, incision c/d/i, JP SS  Lab Results:   Recent Labs  09/28/15 0630  WBC 12.3*  HGB 11.3*  HCT 37.4  PLT 198   BMET  Recent Labs  09/28/15 0630 09/30/15 0522  NA 140  --   K 4.0  --   CL 98*  --   CO2 34*  --   GLUCOSE 117*  --   BUN 13  --   CREATININE 0.85 0.75  CALCIUM 8.9  --     Anti-infectives: Anti-infectives    Start     Dose/Rate Route Frequency Ordered Stop   09/29/15 0930  amoxicillin-clavulanate (AUGMENTIN) 500-125 MG per tablet 500 mg     1 tablet Oral Every 8 hours 09/29/15 0832     09/26/15 0600  vancomycin (VANCOCIN) IVPB 750 mg/150 ml premix  Status:  Discontinued     750 mg 150 mL/hr over 60 Minutes Intravenous Every 12 hours 09/25/15 1540 09/27/15 0928   09/25/15 1545  vancomycin (VANCOCIN) 1,500 mg in sodium chloride 0.9 % 500 mL IVPB     1,500 mg 250 mL/hr over 120 Minutes Intravenous  Once 09/25/15 1538 09/25/15 1752   09/23/15 2015  piperacillin-tazobactam (ZOSYN) IVPB 3.375 g  Status:  Discontinued     3.375 g 12.5 mL/hr over 240 Minutes Intravenous Every 8 hours 09/23/15 2009 09/29/15 0832   09/23/15 1500  piperacillin-tazobactam (ZOSYN) IVPB 3.375 g     3.375 g 100 mL/hr over 30 Minutes Intravenous  Once 09/23/15 1445 09/23/15 1610      Assessment/Plan: s/p Procedure(s): APPENDECTOMY LAPAROSCOPIC  (N/A) Cont' breathing tx and wean O2; may require pulm eval for home O2 if not able to wean [place] as soon as O2 issues get resolved   LOS: 7 days    Rosario Jacks., [facility] 09/30/2015

## 2015-09-30 NOTE — Progress Notes (Signed)
SATURATION QUALIFICATIONS: (This note is used to comply with regulatory documentation for home oxygen)  Patient Saturations on Room Air at Rest =92%  Patient Saturations on Room Air while Ambulating = 85%  Patient Saturations on 2 Liters of oxygen while Ambulating = 92%  Please briefly explain why patient needs home oxygen:Pt desaturates with activity.    Governor Rooks, PTA pager [phone]

## 2015-09-30 NOTE — Progress Notes (Signed)
Physical Therapy Treatment Patient Details Name: Grace [name] MRN: [id number] DOB: 03/18/1956 Today's Date: 09/30/2015    History of Present Illness 60 y.o. Female with past medical history of COPD, asthma, CVA (2002), tobacco abuse, diabetes mellitus, hypothyroidism, tachycardia, and barrett's esophagus. She presented to [facility] on 6/11 and had an emergent laparoscopic appendectomy. Pt did develop acute on chronic hypercarbic respiratory failure secondary to sedating medication and hypotension post op.    PT Comments    Pt performed increased gait, attempted on RA but O2 decreased to 85% and required 2L to improve to greater than 90%.  Pt remains to be a good candidate for skilled rehab to improve respiratory function and endurance before returning home.     Follow Up Recommendations  SNF;Supervision/Assistance - 24 hour     Equipment Recommendations  Rolling walker with 5" wheels    Recommendations for Other Services       Precautions / Restrictions Precautions Precautions: Fall Restrictions Weight Bearing Restrictions: No    Mobility  Bed Mobility Overal bed mobility: Needs Assistance Bed Mobility: Supine to Sit;Sit to Supine     Supine to sit: Supervision Sit to supine: Min guard   General bed mobility comments: Pt required cues for foot placement and positioning.    Transfers Overall transfer level: Needs assistance Equipment used: Rolling walker (2 wheeled) Transfers: Sit to/from Stand Sit to Stand: Supervision Stand pivot transfers: Supervision       General transfer comment: Cues for hand placement to push from seated surface to imrpove ease and maintain safety.    Ambulation/Gait Ambulation/Gait assistance: Supervision Ambulation Distance (Feet): 200 Feet (+10ft) Assistive device: Rolling walker (2 wheeled) Gait Pattern/deviations: Step-through pattern;Decreased stride length;Trunk flexed Gait velocity: significantly decreased   General  Gait Details: Cues for upper trunk control, increasing B stride length and endurance.  2/4 DOE.  O2 sats 85%-89% on RA.  Pt required 2L to maintain O2 sats greater than 92%.     Stairs Stairs: Yes Stairs assistance: Supervision Stair Management: One rail Right;Step to pattern Number of Stairs: 4 General stair comments: Cues for sequencing and hand placement.    Wheelchair Mobility    Modified Rankin (Stroke Patients Only)       Balance                                    Cognition Arousal/Alertness: Awake/alert Behavior During Therapy: WFL for tasks assessed/performed Overall Cognitive Status: Within Functional Limits for tasks assessed                      Exercises      General Comments        Pertinent Vitals/Pain Pain Assessment: 0-10 Pain Score: 3  Pain Location: surgical site.   Pain Descriptors / Indicators: Guarding;Grimacing;Discomfort Pain Intervention(s): Monitored during session;Repositioned    Home Living                      Prior Function            PT Goals (current goals can now be found in the care plan section) Acute Rehab PT Goals Patient Stated Goal: to get better Potential to Achieve Goals: Good Progress towards PT goals: Progressing toward goals    Frequency  Min 3X/week    PT Plan Current plan remains appropriate    Co-evaluation  End of Session Equipment Utilized During Treatment: Oxygen;Gait belt Activity Tolerance: Patient limited by fatigue;Patient limited by pain Patient left: in chair;with call bell/phone within reach;with chair alarm set     Time: 1543-1610 PT Time Calculation (min) (ACUTE ONLY): 27 min  Charges:  $Gait Training: 8-22 mins $Therapeutic Activity: 8-22 mins                    G Codes:      Grace [name] 20-Oct-2015, 4:20 PM  Grace [name], PTA pager [phone]

## 2015-10-01 LAB — CBC
HEMATOCRIT: 40.8 % (ref 36.0–46.0)
Hemoglobin: 12.2 g/dL (ref 12.0–15.0)
MCH: 27.9 pg (ref 26.0–34.0)
MCHC: 29.9 g/dL — AB (ref 30.0–36.0)
MCV: 93.2 fL (ref 78.0–100.0)
Platelets: 300 10*3/uL (ref 150–400)
RBC: 4.38 MIL/uL (ref 3.87–5.11)
RDW: 14.5 % (ref 11.5–15.5)
WBC: 18.8 10*3/uL — ABNORMAL HIGH (ref 4.0–10.5)

## 2015-10-01 LAB — BASIC METABOLIC PANEL
Anion gap: 8 (ref 5–15)
BUN: 8 mg/dL (ref 6–20)
CO2: 29 mmol/L (ref 22–32)
Calcium: 8.8 mg/dL — ABNORMAL LOW (ref 8.9–10.3)
Chloride: 95 mmol/L — ABNORMAL LOW (ref 101–111)
Creatinine, Ser: 0.68 mg/dL (ref 0.44–1.00)
GFR calc Af Amer: >60 mL/min (ref >60)
GLUCOSE: 115 mg/dL — AB (ref 65–99)
POTASSIUM: 4.8 mmol/L (ref 3.5–5.1)
Sodium: 132 mmol/L — ABNORMAL LOW (ref 135–145)

## 2015-10-01 LAB — GLUCOSE, CAPILLARY: Glucose-Capillary: 133 mg/dL — ABNORMAL HIGH (ref 65–99)

## 2015-10-01 MED ORDER — DOCUSATE SODIUM 100 MG PO CAPS: 100.0000 mg | ORAL_CAPSULE | Freq: Two times a day (BID) | ORAL | Status: DC

## 2015-10-01 MED ORDER — AMOXICILLIN-POT CLAVULANATE 500-125 MG PO TABS: 1.0000 | ORAL_TABLET | Freq: Three times a day (TID) | ORAL | Status: DC

## 2015-10-01 MED ORDER — OXYCODONE-ACETAMINOPHEN 5-325 MG PO TABS: 1.0000 | ORAL_TABLET | Freq: Four times a day (QID) | ORAL | Status: DC | PRN

## 2015-10-01 NOTE — Care Management Important Message (Signed)
Important Message  Patient Details  Name: Grace [name] MRN: [id number] Date of Birth: 1955-11-16   Medicare Important Message Given:  Yes    Loann Quill 10/01/2015, 10:32 AM

## 2015-10-01 NOTE — Care Management Note (Signed)
Case Management Note  Patient Details  Name: Grace [name] MRN: [id number] Date of Birth: 08/29/1955  Subjective/Objective:                    Action/Plan:  Spoke with patient this am . She does not want short term rehab , she wants to go home with home health , aware home health will only be there a couple times a week for maybe 30 minutes . Patient states she has friends to help her at home. Await oxygen saturation note before home oxygen can be ordered. Patient voiced understanding. Expected Discharge Date:                  Expected Discharge Plan:  [place]  In-House Referral:     Discharge planning Services  CM Consult  Post Acute Care Choice:  Home Health Choice offered to:  Patient  DME Arranged:  Oxygen DME Agency:  [facility] Arranged:  RN, PT, OT [facility] Agency:  [place]  Status of Service:  Completed, signed off  Medicare Important Message Given:  Yes Date Medicare IM Given:    Medicare IM give by:    Date Additional Medicare IM Given:    Additional Medicare Important Message give by:     If discussed at [place] of Stay Meetings, dates discussed:    Additional Comments:  Marilu Favre, RN 10/01/2015, 9:49 AM

## 2015-10-01 NOTE — Clinical Social Work Note (Signed)
CSW received referral for SNF.  Case discussed with case manager, and plan is to discharge home with home health.  CSW to sign off please re-consult if social work needs arise.  Evalee Gerard R. Elonda Giuliano, MSW, LCSWA [phone]  

## 2015-10-01 NOTE — Progress Notes (Signed)
SATURATION QUALIFICATIONS: (This note is used to comply with regulatory documentation for home oxygen)  Patient Saturations on Room Air at Rest = 88%  Patient Saturations on Room Air while Ambulating = 88%  Patient Saturations on 2 Liters of oxygen while Ambulating = 95%  Please briefly explain why patient needs home oxygen: COPD

## 2015-10-01 NOTE — Progress Notes (Signed)
Patient ID: Grace [name], female   DOB: [birth year], 60 y.o.   MRN: [id number]     [facility]      [address] [place], [place] [address]    Phone: [phone] FAX: [phone]     Subjective: No n/v.  Appetite is okay.  Having BMs. Voiding. Ambulating, but sob.   Objective:  Vital signs:  Filed Vitals:   09/30/15 1936 09/30/15 1940 09/30/15 2154 10/01/15 0628  BP:   127/63 117/57  Pulse:   91 81  Temp:   100 F (37.8 C) 98.7 F (37.1 C)  TempSrc:   Oral Oral  Resp:   21 20  Height:      Weight:      SpO2: 95% 94% 94% 93%    Last BM Date: 09/30/15  Intake/Output   Yesterday:  06/18 0701 - 06/19 0700 In: 440 [P.O.:440] Out: 400 [Urine:400] This shift:    I/O last 3 completed shifts: In: 24 [P.O.:440] Out: 405 [Urine:400; Drains:5]    Physical Exam: General: Pt awake/alert/oriented x4 in no acute distress Chest: cta.  No chest wall pain w good excursion CV:  Pulses intact.  Regular rhythm Abdomen: Soft.  Nondistended.   Mildly tender at incisions only.  Incisions are c/d/i.  No evidence of peritonitis.  No incarcerated hernias. Ext:  SCDs BLE.  No mjr edema.  No cyanosis Skin: No petechiae / purpura   Problem List:   Active Problems:   Perforated appendicitis   Acute appendicitis with localized peritonitis   Acute respiratory failure with hypoxemia ([place])   Encounter for central line placement    Results:   Labs: Results for orders placed or performed during the hospital encounter of 09/23/15 (from the past 48 hour(s))  Glucose, capillary     Status: Abnormal   Collection Time: 09/29/15 12:46 PM  Result Value Ref Range   Glucose-Capillary 129 (H) 65 - 99 mg/dL  Glucose, capillary     Status: Abnormal   Collection Time: 09/29/15  5:02 PM  Result Value Ref Range   Glucose-Capillary 108 (H) 65 - 99 mg/dL  Glucose, capillary     Status: Abnormal   Collection Time: 09/29/15 10:22 PM  Result Value  Ref Range   Glucose-Capillary 146 (H) 65 - 99 mg/dL   Comment 1 Notify RN    Comment 2 Document in Chart   Creatinine, serum     Status: None   Collection Time: 09/30/15  5:22 AM  Result Value Ref Range   Creatinine, Ser 0.75 0.44 - 1.00 mg/dL   GFR calc non Af Amer >60 >60 mL/min   GFR calc Af Amer >60 >60 mL/min    Comment: (NOTE) The eGFR has been calculated using the CKD EPI equation. This calculation has not been validated in all clinical situations. eGFR's persistently <60 mL/min signify possible Chronic Kidney Disease.   Glucose, capillary     Status: Abnormal   Collection Time: 09/30/15  7:48 AM  Result Value Ref Range   Glucose-Capillary 121 (H) 65 - 99 mg/dL  Glucose, capillary     Status: Abnormal   Collection Time: 09/30/15 11:15 AM  Result Value Ref Range   Glucose-Capillary 199 (H) 65 - 99 mg/dL   Comment 1 Notify RN   Glucose, capillary     Status: Abnormal   Collection Time: 09/30/15  9:53 PM  Result Value Ref Range   Glucose-Capillary 199 (H) 65 - 99 mg/dL  Imaging / Studies: No results found.  Medications / Allergies:  Scheduled Meds: . amoxicillin-clavulanate  1 tablet Oral Q8H  . arformoterol  15 mcg Nebulization BID   And  . umeclidinium bromide  1 puff Inhalation Daily  . budesonide (PULMICORT) nebulizer solution  0.5 mg Nebulization Q12H  . docusate sodium  100 mg Oral BID  . enoxaparin (LOVENOX) injection  40 mg Subcutaneous Q24H  . insulin aspart  0-20 Units Subcutaneous TID WC  . insulin aspart  0-5 Units Subcutaneous QHS  . levothyroxine  175 mcg Oral QAC breakfast  . metoprolol tartrate  12.5 mg Oral BID  . polyethylene glycol  17 g Oral Daily  . tamsulosin  0.4 mg Oral Daily  . traZODone  100 mg Oral QHS   Continuous Infusions:  PRN Meds:.acetaminophen **OR** acetaminophen, fentaNYL (SUBLIMAZE) injection, levalbuterol, methocarbamol, ondansetron **OR** ondansetron (ZOFRAN) IV, oxyCODONE  Antibiotics: Anti-infectives    Start      Dose/Rate Route Frequency Ordered Stop   09/29/15 0930  amoxicillin-clavulanate (AUGMENTIN) 500-125 MG per tablet 500 mg     1 tablet Oral Every 8 hours 09/29/15 0832     09/26/15 0600  vancomycin (VANCOCIN) IVPB 750 mg/150 ml premix  Status:  Discontinued     750 mg 150 mL/hr over 60 Minutes Intravenous Every 12 hours 09/25/15 1540 09/27/15 0928   09/25/15 1545  vancomycin (VANCOCIN) 1,500 mg in sodium chloride 0.9 % 500 mL IVPB     1,500 mg 250 mL/hr over 120 Minutes Intravenous  Once 09/25/15 1538 09/25/15 1752   09/23/15 2015  piperacillin-tazobactam (ZOSYN) IVPB 3.375 g  Status:  Discontinued     3.375 g 12.5 mL/hr over 240 Minutes Intravenous Every 8 hours 09/23/15 2009 09/29/15 0832   09/23/15 1500  piperacillin-tazobactam (ZOSYN) IVPB 3.375 g     3.375 g 100 mL/hr over 30 Minutes Intravenous  Once 09/23/15 1445 09/23/15 1610       Assessment/Plan Perforated appendicitis  POD#8 laparoscopic appendectomy--Dr. Grandville Silos -stable COPD-requiring 3L, wean as able   Mild renal insufficiency-resolved Urinary retention-resolved  Mental status changes-2/2 dilaudid, resolved ID-zosyn D#5.  Augmentin 2/5 FEN-no issues. miralax/colace VTE prophylaxis-scd, lovenox  Tachycardia/hypotension-resolved HTN-home meds DM II-SSI/CBGs  Dispo-SNF when bed available     Erby Pian, ANP-BC [facility] Pager [phone](7A-4:30P) For consults and floor pages call [phone](7A-4:30P)  10/01/2015 8:15 AM

## 2015-10-01 NOTE — Progress Notes (Signed)
AVS given to patient. Understanding demonstrated. Belongings packed. One of lost earrings found in the bottom of her purse, Security notified RN of no gold earrings with them. Transportation arranged by patient.  IV removed.

## 2015-10-01 NOTE — Discharge Instructions (Signed)

## 2015-10-01 NOTE — Discharge Summary (Signed)
Physician Discharge Summary  Grace [name] IPJ:[id number] DOB: 1955-08-02 DOA: 09/23/2015  PCP: Gildardo Cranker, DO  Consultation: PCCM  Admit date: 09/23/2015 Discharge date: 10/01/2015  Recommendations for Outpatient Follow-up:   Follow-up Information    Follow up with Zenovia Jarred, MD On 10/17/2015.   Specialty:  General Surgery   Contact information:   [address] [place] [address] [phone]       Follow up with Gildardo Cranker, DO In 1 week.   Specialty:  Internal Medicine   Why:  to check on oxygen and wean off oxygen   Contact information:   [address] [place] [address] [phone]      Discharge Diagnoses:  1. Perforated appendicitis 2. COPD 3. Mild renal insufficiency 4. Acute urinary retention 5. Mental status changes 6. Tachycardia 7. Hypotension 8. Hypertension 9. DM II   Surgical Procedure: laparoscopic appendectomy--Dr. Grandville Silos 09/23/15  Discharge Condition: stable Disposition: home  Diet recommendation: carb modified diet  Filed Weights   09/23/15 1135 09/23/15 1945  Weight: 81.647 kg (180 lb) 89 kg (196 lb 3.4 oz)     Filed Vitals:   09/30/15 2154 10/01/15 0628  BP: 127/63 117/57  Pulse: 91 81  Temp: 100 F (37.8 C) 98.7 F (37.1 C)  Resp: 21 20     Hospital Course:  Grace [name] presented to [facility] with diarrhea and RLQ abdominal pain.  She was found to have a WBC 19.8k, CT of abdomen and pelvis revealed acute appendicitis. The patient underwent the procedure listed above.  She was found to have a perforated appendix and had a drain placed.  Post operatively, she was transferred to SDU and pulmonary was consulted for management of COPD.  On POD#1 she was mobilized, aggressive pulmonary toilet initiated.  Found to have mild acute renal insufficiency which resolved with IV hydration.  Also had tachycardia and hypotension which resolved with IV hydration.  On POD#2 she had mental status changes which was  attributed to dilaudid and improved with narcan.  Also had urinary retention and foley was replaced.  She was transferred to the ICU for closer monitoring.  Continued tachycardia with increased work of breathing requiring BiPAP which was subsequently weaned down to 3L [place].  The patient was kept on zosyn and transitioned to augmentin for a total of 10 days course.  Maintained on SCDs and lovenox.  Diet was advanced.  JP Drain with minimal serous output was removed on POD#7.  As respiratory status improved, the patient was transferred to the floor and mobilized. She did however require ongoing oxygen with rest and activity at 3L.  Therapies recommended SNF v 24h assistance.  We recommended SNF initially, however, the patient wished to go home with her friend.  Home health was arranged including a Education officer, museum, RN, therapies and home oxygen.  She understands that she will need to follow up with her pcp in approximately 1 week to re-evaluate need for continued oxygen therapy.  Follow up in our office has been arranged. Warning signs that warrant further evaluation were discussed.  She was encouraged to call with questions or concerns.     Discharge Instructions     Medication List    TAKE these medications        albuterol 108 (90 Base) MCG/ACT inhaler  Commonly known as:  PROAIR HFA  Inhale 2 puffs into the lungs every 6 (six) hours as needed for wheezing or shortness of breath.     amoxicillin-clavulanate  500-125 MG tablet  Commonly known as:  AUGMENTIN  Take 1 tablet (500 mg total) by mouth every 8 (eight) hours.     Aspirin-Acetaminophen-Caffeine 500-325-65 MG Pack  Take 1 Package by mouth as needed.     atorvastatin 80 MG tablet  Commonly known as:  LIPITOR  Take one tablet by mouth once daily at bedtime for cholesterol     diphenhydrAMINE 25 MG tablet  Commonly known as:  BENADRYL  Take 25 mg by mouth daily as needed for sleep.     docusate sodium 100 MG capsule  Commonly known as:   COLACE  Take 1 capsule (100 mg total) by mouth 2 (two) times daily.     fluticasone 44 MCG/ACT inhaler  Commonly known as:  FLOVENT HFA  INHALE TWO PUFFS INTO LUNGS TWICE DAILY     glipiZIDE 5 MG 24 hr tablet  Commonly known as:  GLUCOTROL XL  Take one tablet by mouth once daily to control blood sugar     glucose blood test strip  Commonly known as:  ONETOUCH VERIO  Use to self monitor blood glucose twice daily and as directed (E11.9)     HAIR/SKIN/NAILS Tabs  Take 1 tablet by mouth daily.     ibuprofen 200 MG tablet  Commonly known as:  ADVIL,MOTRIN  Take 600 mg by mouth as needed for moderate pain.     levothyroxine 175 MCG tablet  Commonly known as:  SYNTHROID, LEVOTHROID  Take 1 tablet (175 mcg total) by mouth daily before breakfast.     lisinopril 10 MG tablet  Commonly known as:  PRINIVIL,ZESTRIL  Take one tablet by mouth once daily for blood pressure     LORazepam 0.5 MG tablet  Commonly known as:  ATIVAN  Take one tablet by mouth three times daily as needed for anxiety or nervousness     Melatonin 10 MG Caps  Take by mouth at bedtime.     metoprolol tartrate 25 MG tablet  Commonly known as:  LOPRESSOR  Take 1 tablet (25 mg total) by mouth 2 (two) times daily.     Nebulizer/Tubing/Mouthpiece Kit  1 nebulizer every 12 hours. DX: J44.9     omeprazole 20 MG capsule  Commonly known as:  PRILOSEC  Take 1 capsule (20 mg total) by mouth daily.     ONETOUCH DELICA LANCETS 27P Misc  1 each by Does not apply route 2 (two) times daily.     oxyCODONE-acetaminophen 5-325 MG tablet  Commonly known as:  ROXICET  Take 1-2 tablets by mouth every 6 (six) hours as needed for severe pain.     REFRESH EYE ITCH RELIEF OP  Place 1 drop into both eyes daily as needed. For itchy eyes     traZODone 100 MG tablet  Commonly known as:  DESYREL  Take 1 tablet (100 mg total) by mouth at bedtime.     umeclidinium-vilanterol 62.5-25 MCG/INH Aepb  Commonly known as:  ANORO ELLIPTA   Inhale 1 puff into the lungs daily.     VALERIAN ROOT PO  Take 1 tablet by mouth at bedtime.           Follow-up Information    Follow up with Zenovia Jarred, MD.   Specialty:  General Surgery   Contact information:   [address] [place] [address] [phone]        The results of significant diagnostics from this hospitalization (including imaging, microbiology, ancillary and laboratory) are listed below  for reference.    Significant Diagnostic Studies: Ct Abdomen Pelvis W Contrast  09/23/2015  CLINICAL DATA:  Lower abdominal pain and nausea starting Friday night EXAM: CT ABDOMEN AND PELVIS WITH CONTRAST TECHNIQUE: Multidetector CT imaging of the abdomen and pelvis was performed using the standard protocol following bolus administration of intravenous contrast. CONTRAST:  5m ISOVUE-300 IOPAMIDOL (ISOVUE-300) INJECTION 61% COMPARISON:  None FINDINGS: Lower chest:  The lung bases are unremarkable. Hepatobiliary: The patient is status postcholecystectomy. Mild intrahepatic biliary ductal dilatation. No focal hepatic mass. Pancreas: Enhanced pancreas is unremarkable. Spleen: Enhanced spleen is unremarkable. Adrenals/Urinary Tract: Mild thickening of adrenal glands without definite evidence of mass. Mild lobulated renal contour bilaterally. No hydronephrosis or hydroureter. There is a cyst in lower pole of the right kidney measures 1.2 cm. Cyst in lower pole of the left kidney measures 1.5 cm. No hydronephrosis or hydroureter. Delayed renal images shows bilateral renal symmetrical excretion. Bilateral visualized proximal ureter is unremarkable. Stomach/Bowel: No small bowel obstruction. No thickened or dilated small bowel loops. There is inflammatory process in right lower quadrant. There is significant thickening of the appendix up to 2 cm. There is significant periappendiceal stranding and small amount of periappendiceal fluid is noted. At least 2 calcified  appendicoliths are noted the largest at the base of the appendix measures 9 mm. Findings are consistent with acute appendicitis. There is no definite evidence of perforation, however micro perforation cannot be excluded. Vascular/Lymphatic: Atherosclerotic calcifications of abdominal aorta and iliac arteries are noted. No aortic aneurysm. Reproductive: Mild enlarged uterus. At least 3 by material fibroids are noted within uterus the largest in right fundal region measures 5.3 cm. Other: There is no evidence of free abdominal air. Small amount of fluid and stranding of the mesenteric fat is noted in right paracolic gutter. Musculoskeletal: No destructive bony lesions are noted. Degenerative changes lumbar spine. Mild degenerative changes bilateral SI joints. Mild degenerative changes pubic symphysis. IMPRESSION: 1. Findings consistent with acute appendicitis as described above. 2. No hydronephrosis or hydroureter. 3. No small bowel obstruction. 4. No hydronephrosis or hydroureter. 5. Mild enlarged uterus with multiple myometrial fibroids the largest measures 5.3 cm in right fundal region. 6. Degenerative changes lumbar spine. These results were called by telephone at the time of interpretation on 09/23/2015 at 2:46 pm to Dr. DLeo Grosser, who verbally acknowledged these results. Electronically Signed   By: LLahoma CrockerM.D.   On: 09/23/2015 14:48   Dg Chest Port 1 View  09/26/2015  CLINICAL DATA:  Respiratory failure. EXAM: PORTABLE CHEST 1 VIEW COMPARISON:  09/25/2015. FINDINGS: Right IJ line stable position. Mediastinum hilar structures normal. Cardiomegaly with normal pulmonary vascularity. Low lung volumes with mild bibasilar atelectasis and/or infiltrates again noted. Small pleural effusions cannot be excluded. IMPRESSION: 1. Right IJ line stable position. 2. Low lung volumes with bibasilar atelectasis and/or infiltrates. No interim change. Small pleural effusions cannot be excluded. Electronically Signed   By:  TMarcello Moores Register   On: 09/26/2015 07:20   Dg Chest Port 1 View  09/25/2015  CLINICAL DATA:  Central line placement today. EXAM: PORTABLE CHEST 1 VIEW COMPARISON:  Single view of the chest earlier today. FINDINGS: A new right IJ catheter is in place with the tip projecting at the mid to lower superior vena cava. No pneumothorax. Right basilar airspace disease is again seen. The left lung is clear. Heart size is normal. IMPRESSION: Right IJ catheter tip projects at the mid to lower superior vena cava. Negative for pneumothorax. No change  in right basilar airspace disease. Electronically Signed   By: Inge Rise M.D.   On: 09/25/2015 17:34   Dg Chest Port 1 View  09/25/2015  CLINICAL DATA:  Shortness of Breath EXAM: PORTABLE CHEST 1 VIEW COMPARISON:  September 23, 2015 FINDINGS: There are pleural effusions bilaterally. There is no edema or consolidation. There is patchy atelectasis in the left lower lobe. Heart is upper normal in size with pulmonary vascularity within normal limits. No adenopathy. No bone lesions. IMPRESSION: Pleural effusions bilaterally which appear fairly small. Lung atelectasis left base. Heart upper normal in size. Suspect a degree of pulmonary vascular congestion. Electronically Signed   By: Lowella Grip III M.D.   On: 09/25/2015 09:35   Dg Abd Acute W/chest  09/23/2015  CLINICAL DATA:  Pt c/o diarrhea/vomiting since Friday. Severe lower abdominal pain today, no appetite. Some mid-chest pain. Smoker 1/2 ppd. Diabetic type 2. Copd. Htn. Hx gallstones. EXAM: DG ABDOMEN ACUTE W/ 1V CHEST COMPARISON:  None. FINDINGS: Frontal view of the chest demonstrates midline trachea. Normal heart size. Atherosclerosis in the transverse aorta. No pleural effusion or pneumothorax. Probable EKG lead artifact projecting over the upper lobes bilaterally. Abdominal films demonstrate minimal small bowel air-fluid levels in the left-sided abdomen. Borderline mid small bowel distension. Cholecystectomy.  Convex left lumbar spine curvature. Distal gas and stool. IMPRESSION: Nonspecific minimal left abdominal air-fluid levels and borderline small bowel distension. Question mild adynamic ileus as can be seen with gastroenteritis. Probable EKG lead artifacts projecting over the upper lungs. Consider repeat radiograph with removal of all support apparatus. Electronically Signed   By: Abigail Miyamoto M.D.   On: 09/23/2015 12:38    Microbiology: Recent Results (from the past 240 hour(s))  Blood culture (routine x 2)     Status: None   Collection Time: 09/23/15  3:15 PM  Result Value Ref Range Status   Specimen Description BLOOD RIGHT HAND  Final   Special Requests BOTTLES DRAWN AEROBIC ONLY 5CC  Final   Culture NO GROWTH 5 DAYS  Final   Report Status 09/28/2015 FINAL  Final  Blood culture (routine x 2)     Status: None   Collection Time: 09/23/15  3:21 PM  Result Value Ref Range Status   Specimen Description BLOOD LEFT HAND  Final   Special Requests BOTTLES DRAWN AEROBIC ONLY 5CC  Final   Culture NO GROWTH 5 DAYS  Final   Report Status 09/28/2015 FINAL  Final  MRSA PCR Screening     Status: None   Collection Time: 09/24/15  6:48 AM  Result Value Ref Range Status   MRSA by PCR NEGATIVE NEGATIVE Final    Comment:        The GeneXpert MRSA Assay (FDA approved for NASAL specimens only), is one component of a comprehensive MRSA colonization surveillance program. It is not intended to diagnose MRSA infection nor to guide or monitor treatment for MRSA infections. Performed at [place]: Basic Metabolic Panel:  Recent Labs Lab 09/25/15 0245 09/25/15 2228 09/26/15 0529 09/27/15 0602 09/28/15 0630 09/30/15 0522 10/01/15 0831  NA 136  --  139 136 140  --  132*  K 4.7  --  3.1* 3.2* 4.0  --  4.8  CL 108  --  98* 93* 98*  --  95*  CO2 25  --  34* 33* 34*  --  29  GLUCOSE 134*  --  164* 155* 117*  --  115*  BUN  29*  --  _0 --  8  CREATININE 1.17*   --  0.94 0.66 0.85 0.75 0.68  CALCIUM 8.9  --  9.0 8.5* 8.9  --  8.8*  MG  --  1.5* 1.9 1.9 1.7  --   --   PHOS  --   --  2.1* 2.3* 2.6  --   --    Liver Function Tests: No results for input(s): AST, ALT, ALKPHOS, BILITOT, PROT, ALBUMIN in the last 168 hours. No results for input(s): LIPASE, AMYLASE in the last 168 hours. No results for input(s): AMMONIA in the last 168 hours. CBC:  Recent Labs Lab 09/25/15 0245 09/26/15 0529 09/27/15 0602 09/28/15 0630 10/01/15 0831  WBC 10.3 11.0* 12.4* 12.3* PENDING  HGB 11.6* 11.6* 10.7* 11.3* 12.2  HCT 40.5 38.3 35.7* 37.4 40.8  MCV 94.6 91.6 90.6 91.4 93.2  PLT 162 156 167 198 PENDING   Cardiac Enzymes: No results for input(s): CKTOTAL, CKMB, CKMBINDEX, TROPONINI in the last 168 hours. BNP: BNP (last 3 results)  Recent Labs  09/23/15 1157 09/25/15 1825  BNP 139.8* 370.9*    ProBNP (last 3 results) No results for input(s): PROBNP in the last 8760 hours.  CBG:  Recent Labs Lab 09/29/15 2222 09/30/15 0748 09/30/15 1115 09/30/15 2153 10/01/15 0814  GLUCAP 146* 121* 199* 199* 133*    Active Problems:   Perforated appendicitis   Acute appendicitis with localized peritonitis   Acute respiratory failure with hypoxemia ([place])   Encounter for central line placement   Time coordinating discharge: <30 mins   Signed:  Malaisha Silliman, ANP-BC

## 2015-10-04 ENCOUNTER — Other Ambulatory Visit: Payer: Self-pay | Admitting: *Deleted

## 2015-10-04 MED ORDER — UMECLIDINIUM-VILANTEROL 62.5-25 MCG/INH IN AEPB: 1.0000 | INHALATION_SPRAY | Freq: Every day | RESPIRATORY_TRACT | Status: DC

## 2015-10-04 NOTE — Telephone Encounter (Signed)
Patient requested refill to be sent to pharmacy.  

## 2015-10-05 DIAGNOSIS — E119 Type 2 diabetes mellitus without complications: Secondary | ICD-10-CM | POA: Diagnosis not present

## 2015-10-05 DIAGNOSIS — Z8719 Personal history of other diseases of the digestive system: Secondary | ICD-10-CM | POA: Diagnosis not present

## 2015-10-05 DIAGNOSIS — K219 Gastro-esophageal reflux disease without esophagitis: Secondary | ICD-10-CM | POA: Diagnosis not present

## 2015-10-05 DIAGNOSIS — Z7984 Long term (current) use of oral hypoglycemic drugs: Secondary | ICD-10-CM | POA: Diagnosis not present

## 2015-10-05 DIAGNOSIS — Z8673 Personal history of transient ischemic attack (TIA), and cerebral infarction without residual deficits: Secondary | ICD-10-CM | POA: Diagnosis not present

## 2015-10-05 DIAGNOSIS — Z87891 Personal history of nicotine dependence: Secondary | ICD-10-CM | POA: Diagnosis not present

## 2015-10-05 DIAGNOSIS — Z48815 Encounter for surgical aftercare following surgery on the digestive system: Secondary | ICD-10-CM | POA: Diagnosis not present

## 2015-10-05 DIAGNOSIS — J449 Chronic obstructive pulmonary disease, unspecified: Secondary | ICD-10-CM | POA: Diagnosis not present

## 2015-10-05 DIAGNOSIS — E039 Hypothyroidism, unspecified: Secondary | ICD-10-CM | POA: Diagnosis not present

## 2015-10-05 DIAGNOSIS — I10 Essential (primary) hypertension: Secondary | ICD-10-CM | POA: Diagnosis not present

## 2015-10-08 ENCOUNTER — Encounter: Payer: Self-pay | Admitting: Nurse Practitioner

## 2015-10-08 ENCOUNTER — Ambulatory Visit (INDEPENDENT_AMBULATORY_CARE_PROVIDER_SITE_OTHER): Payer: Medicare Other | Admitting: Nurse Practitioner

## 2015-10-08 VITALS — BP 92/84 | HR 58 | Temp 98.0°F | Resp 20 | Ht 63.0 in | Wt 173.4 lb

## 2015-10-08 DIAGNOSIS — E039 Hypothyroidism, unspecified: Secondary | ICD-10-CM | POA: Diagnosis not present

## 2015-10-08 DIAGNOSIS — J45909 Unspecified asthma, uncomplicated: Secondary | ICD-10-CM

## 2015-10-08 DIAGNOSIS — K353 Acute appendicitis with localized peritonitis, without perforation or gangrene: Secondary | ICD-10-CM

## 2015-10-08 DIAGNOSIS — R197 Diarrhea, unspecified: Secondary | ICD-10-CM

## 2015-10-08 MED ORDER — IPRATROPIUM-ALBUTEROL 0.5-2.5 (3) MG/3ML IN SOLN: 3.0000 mL | Freq: Four times a day (QID) | RESPIRATORY_TRACT | Status: DC | PRN

## 2015-10-08 NOTE — Progress Notes (Signed)
Patient ID: Grace [name], female   DOB: [birth year], 60 y.o.   MRN: [id number]    PCP: Gildardo Cranker, DO  Advanced Directive information Does patient have an advance directive?: No, Would patient like information on creating an advanced directive?: Yes - Educational materials given  Allergies  Allergen Reactions  . Codeine     REACTION: headaches  . Prozac [Fluoxetine Hcl] Other (See Comments)    Suicidal ideations    Chief Complaint  Patient presents with  . Hospitalization Follow-up    had a episode of nausea last night,     HPI: Patient is a 60 y.o. female seen in the office today for hospitalization follow up. Pt went to [facility] with diarrhea and RLQ abdominal pain. She was found to have a WBC 19.8k, CT of abdomen and pelvis revealed acute appendicitis. She was found to have a perforated appendix and had a drain placed.Pthad tachycardia and hypotension which resolved with IV hydration. On POD#2 she had mental status changes which was attributed to dilaudid and improved with narcan. Also had urinary retention and foley was replaced. She was transferred to the ICU for closer monitoring. Continued tachycardia with increased work of breathing requiring BiPAP which was subsequently weaned down to 3L [place]. The patient was kept on zosyn and transitioned to augmentin for a total of 10 days course. it was recommended that she go to SNFbut she wished to go home with her friend. Home health was arranged including a Education officer, museum, RN, therapies and home oxygen. Was discharged 1 week ago today.  Feeling rough but better. No energy or strength. Home health therapy is supposed to be in touch with her but has not yet.  Nursing went out 4 days ago.  Has a walker but does not need it Has not used O2 in 5 days.  Feels like she is recovering well, just slow Still hurting. Only taking pain medication rarely. Using Ibuprofen mostly Finished Augmentin. Was very constipated while she was in the  hospital. Now has increase frequency of bowels. Having diarrhea Foul odor. Diarrhea since Monday. Nausea and vomiting last night, 1 episode.  No abdominal pain. Temp goes up to about 99.8 at night Having 3-5 stools a day. Not on a stool softener  Was using her mothers duonebs, would like this.    Review of Systems:  Review of Systems  Constitutional: Positive for activity change and fatigue. Negative for chills.  Respiratory: Positive for chest tightness (not new) and shortness of breath.   Cardiovascular: Negative for chest pain, palpitations and leg swelling.  Gastrointestinal: Positive for nausea, vomiting and diarrhea. Negative for abdominal pain, blood in stool and abdominal distention.  Genitourinary: Negative for dysuria and difficulty urinating.  Skin: Negative for wound.  Neurological: Positive for weakness. Negative for dizziness and light-headedness.  Psychiatric/Behavioral: Positive for dysphoric mood.  All other systems reviewed and are negative.   Past Medical History  Diagnosis Date  . GERD (gastroesophageal reflux disease)   . Hyperlipidemia   . Hypertension   . Migraine   . COPD (chronic obstructive pulmonary disease) ([place])   . Hypothyroidism     with hx of multinodular goiter (noted on neck US in 11/2002 - showing diffuse nodularity and inhomogenous texture diffusely BL)  . Asthma   . Hiatal hernia   . Barrett's esophagus     with high grade dysplasia per endoscopy 05/2005 // followed by Dr. [name] ([facility])  . History of CVA (cerebrovascular accident) 10/2000  .  Endometrial polyp 03/2003    s/p resection in 03/2003. Path showing submucosal leiomyoma and benign  proliferative type endometrium  . Duodenal ulcer disease     thought to be contributed at least partly by overuse of NSAIDs and salicylates (goody powdr)  . Internal hemorrhoids     grade 2 per colonoscopy in 09/2006 - repeat colonoscopy rec in 5-10 years.  . 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 Fibroid uterus     s/p myomectomy x 2  . Tachycardia   . Nevus   . H/O failed moderate sedation   . Personal history of failed conscious sedation 08/11/2011  . Thyroid disease   . Chronic back pain   . Abnormal vaginal bleeding   . Tobacco abuse Jan 2014    quit  . Diabetes mellitus without complication [facility])    Past Surgical History  Procedure Laterality Date  . Cholecystectomy    . Myomectomy  1990, 1997    x 2 - In 1997, noted to have extensive pelvic adhesions and BL tubal obstruction  . Colonoscopy  02/07/2010  . Upper gastrointestinal endoscopy  12/18/2009  . Hysteroscopy    . Laparoscopic appendectomy N/A 09/23/2015    Procedure: APPENDECTOMY LAPAROSCOPIC;  Surgeon: Georganna Skeans, MD;  Location: [facility];  Service: General;  Laterality: N/A;   Social History:   reports that she quit smoking about 3 years ago. Her smoking use included Cigarettes. She has a 42 pack-year smoking history. She has never used smokeless tobacco. She reports that she does not drink alcohol or use illicit drugs.  Family History  Problem Relation Age of Onset  . COPD Mother   . Heart disease Father     had 5 heart attacks, first at age 12s.  Marland Kitchen COPD Father   .       Medications: Patient's Medications  New Prescriptions   No medications on file  Previous Medications   ALBUTEROL (PROAIR HFA) 108 (90 BASE) MCG/ACT INHALER    Inhale 2 puffs into the lungs every 6 (six) hours as needed for wheezing or shortness of breath.   ASPIRIN-ACETAMINOPHEN-CAFFEINE 758-832-54 MG PACK    Take 1 Package by mouth as needed.    ATORVASTATIN (LIPITOR) 80 MG TABLET    Take one tablet by mouth once daily at bedtime for cholesterol   DIPHENHYDRAMINE (BENADRYL) 25 MG TABLET    Take 25 mg by mouth daily as needed for sleep. Reported on 10/08/2015   DOCUSATE SODIUM (COLACE) 100 MG CAPSULE    Take 1 capsule (100 mg total) by  mouth 2 (two) times daily.   FLUTICASONE (FLOVENT HFA) 44 MCG/ACT INHALER    INHALE TWO PUFFS INTO LUNGS TWICE DAILY   GLIPIZIDE (GLUCOTROL XL) 5 MG 24 HR TABLET    Take one tablet by mouth once daily to control blood sugar   GLUCOSE BLOOD (ONETOUCH VERIO) TEST STRIP    Use to self monitor blood glucose twice daily and as directed (E11.9)   IBUPROFEN (ADVIL,MOTRIN) 200 MG TABLET    Take 600 mg by mouth as needed for moderate pain.    KETOTIFEN FUMARATE (REFRESH EYE ITCH RELIEF OP)    Place 1 drop into both eyes daily as needed. For itchy eyes   LEVOTHYROXINE (SYNTHROID, LEVOTHROID) 175 MCG TABLET    Take 1 tablet (175 mcg total) by mouth daily before  breakfast.   LISINOPRIL (PRINIVIL,ZESTRIL) 10 MG TABLET    Take one tablet by mouth once daily for blood pressure   LORAZEPAM (ATIVAN) 0.5 MG TABLET    Take one tablet by mouth three times daily as needed for anxiety or nervousness   METOPROLOL TARTRATE (LOPRESSOR) 25 MG TABLET    Take 1 tablet (25 mg total) by mouth 2 (two) times daily.   MULTIPLE VITAMINS-MINERALS (HAIR/SKIN/NAILS) TABS    Take 1 tablet by mouth daily. Reported on 10/08/2015   OMEPRAZOLE (PRILOSEC) 20 MG CAPSULE    Take 1 capsule (20 mg total) by mouth daily.   ONETOUCH DELICA LANCETS 25K MISC    1 each by Does not apply route 2 (two) times daily.   OXYCODONE-ACETAMINOPHEN (ROXICET) 5-325 MG TABLET    Take 1-2 tablets by mouth every 6 (six) hours as needed for severe pain.   RESPIRATORY THERAPY SUPPLIES (NEBULIZER/TUBING/MOUTHPIECE) KIT    1 nebulizer every 12 hours. DX: J44.9   TRAZODONE (DESYREL) 100 MG TABLET    Take 1 tablet (100 mg total) by mouth at bedtime.   UMECLIDINIUM-VILANTEROL (ANORO ELLIPTA) 62.5-25 MCG/INH AEPB    Inhale 1 puff into the lungs daily.  Modified Medications   No medications on file  Discontinued Medications   AMOXICILLIN-CLAVULANATE (AUGMENTIN) 500-125 MG TABLET    Take 1 tablet (500 mg total) by mouth every 8 (eight) hours.   MELATONIN 10 MG CAPS     Take by mouth at bedtime. Reported on 10/08/2015   VALERIAN ROOT PO    Take 1 tablet by mouth at bedtime.      Physical Exam:  Filed Vitals:   10/08/15 1250  BP: 92/84  Pulse: 58  Temp: 98 F (36.7 C)  TempSrc: Oral  Resp: 20  Height: 5' 3"  (1.6 m)  Weight: 173 lb 6.4 oz (78.654 kg)  SpO2: 96%   Body mass index is 30.72 kg/(m^2).  Physical Exam  Constitutional: She appears well-developed and well-nourished. No distress.  Cardiovascular: Normal rate, regular rhythm, normal heart sounds and intact distal pulses.  Exam reveals no gallop and no friction rub.   No murmur heard. Pulmonary/Chest: Effort normal and breath sounds normal. No stridor. No respiratory distress.  Abdominal: Soft. Bowel sounds are normal. There is tenderness (mild tenderness throughtout).  Musculoskeletal: She exhibits no edema.  Neurological: She is alert.  Skin: Skin is warm and dry. No rash noted. There is pallor.  Psychiatric: She has a normal mood and affect. Her behavior is normal. Thought content normal.    Labs reviewed: Basic Metabolic Panel:  Recent Labs  10/27/14 1823  07/02/15 0924 08/15/15 0907  09/26/15 0529 09/27/15 0602 09/28/15 0630 09/30/15 0522 10/01/15 0831  NA  --   < > 140  --   < > 139 136 140  --  132*  K  --   < > 4.3  --   < > 3.1* 3.2* 4.0  --  4.8  CL  --   < > 98  --   < > 98* 93* 98*  --  95*  CO2  --   < > 24  --   < > 34* 33* 34*  --  29  GLUCOSE  --   < > 95  --   < > 164* 155* 117*  --  115*  BUN  --   < > 14  --   < > 17 14 13   --  8  CREATININE  --   < >  0.70  --   < > 0.94 0.66 0.85 0.75 0.68  CALCIUM  --   < > 9.4  --   < > 9.0 8.5* 8.9  --  8.8*  MG  --   --   --   --   < > 1.9 1.9 1.7  --   --   PHOS  --   --   --   --   --  2.1* 2.3* 2.6  --   --   TSH 4.354  --  14.880* 23.300*  --   --   --   --   --   --   < > = values in this interval not displayed. Liver Function Tests:  Recent Labs  11/01/14 1005 07/02/15 0924 09/23/15 1157  AST 27 23 16     ALT 19 12 12*  ALKPHOS 58 85 78  BILITOT 0.2 <0.2 0.7  PROT 6.7 6.8 6.1*  ALBUMIN 4.4 4.2 3.0*    Recent Labs  10/27/14 1822  LIPASE 29   No results for input(s): AMMONIA in the last 8760 hours. CBC:  Recent Labs  10/27/14 1822 07/02/15 0924 09/23/15 1157  09/27/15 0602 09/28/15 0630 10/01/15 0831  WBC 9.4 8.7 19.3*  < > 12.4* 12.3* 18.8*  NEUTROABS 5.8 5.5 16.0*  --   --   --   --   HGB 12.5  --  13.5  < > 10.7* 11.3* 12.2  HCT 38.9 43.2 42.9  < > 35.7* 37.4 40.8  MCV 94.6 88 90.1  < > 90.6 91.4 93.2  PLT 221 260 211  < > 167 198 300  < > = values in this interval not displayed. Lipid Panel:  Recent Labs  01/09/15 0820 07/02/15 0924  CHOL 127 142  HDL 47 58  LDLCALC 55 64  TRIG 125 99  CHOLHDL 2.7 2.4   TSH:  Recent Labs  10/27/14 1823 07/02/15 0924 08/15/15 0907  TSH 4.354 14.880* 23.300*   A1C: Lab Results  Component Value Date   HGBA1C 6.1* 07/02/2015     Assessment/Plan 1. Acute appendicitis with localized peritonitis -s/p appendectomy, slowing improving and doing well now at that she is at home  -home health services following her at home.  - CBC with Differential/Platelets - Basic metabolic panel  2. Diarrhea, unspecified type -since hospitalization, not on laxatives and has completed antibiotics - CBC with Differential/Platelets - Basic metabolic panel - will check stool for C. difficile, PCR; Future  3. Asthma, unspecified asthma severity, uncomplicated Current has quit smoking. Breathing is stable -was previously taking duonebs as needed, will give Rx for this as needed -to cont anoro and flovent   4. Hypothyroidism, unspecified hypothyroidism type -has been on increased dose of synthroid at 175 mcg, reports compliance with medications, will follow up labs today - TSH - T4, Free  To follow up with Dr Eulas Post in 2 weeks [place] American. Harle Battiest  [facility] [phone] 8 am - 5  pm) [phone] (after hours)

## 2015-10-08 NOTE — Patient Instructions (Addendum)
Will get labs today, to bring stool sample back to the office as soon as you can this week  To get your thyroid check today.   Follow up with Dr Eulas Post in 2 weeks (okay to change appt)

## 2015-10-09 DIAGNOSIS — Z8673 Personal history of transient ischemic attack (TIA), and cerebral infarction without residual deficits: Secondary | ICD-10-CM | POA: Diagnosis not present

## 2015-10-09 DIAGNOSIS — Z8719 Personal history of other diseases of the digestive system: Secondary | ICD-10-CM | POA: Diagnosis not present

## 2015-10-09 DIAGNOSIS — K219 Gastro-esophageal reflux disease without esophagitis: Secondary | ICD-10-CM | POA: Diagnosis not present

## 2015-10-09 DIAGNOSIS — J449 Chronic obstructive pulmonary disease, unspecified: Secondary | ICD-10-CM | POA: Diagnosis not present

## 2015-10-09 DIAGNOSIS — Z7984 Long term (current) use of oral hypoglycemic drugs: Secondary | ICD-10-CM | POA: Diagnosis not present

## 2015-10-09 DIAGNOSIS — Z48815 Encounter for surgical aftercare following surgery on the digestive system: Secondary | ICD-10-CM | POA: Diagnosis not present

## 2015-10-09 DIAGNOSIS — E119 Type 2 diabetes mellitus without complications: Secondary | ICD-10-CM | POA: Diagnosis not present

## 2015-10-09 DIAGNOSIS — Z87891 Personal history of nicotine dependence: Secondary | ICD-10-CM | POA: Diagnosis not present

## 2015-10-09 DIAGNOSIS — E039 Hypothyroidism, unspecified: Secondary | ICD-10-CM | POA: Diagnosis not present

## 2015-10-09 DIAGNOSIS — I10 Essential (primary) hypertension: Secondary | ICD-10-CM | POA: Diagnosis not present

## 2015-10-09 LAB — BASIC METABOLIC PANEL
BUN/Creatinine Ratio: 14 (ref 12–28)
BUN: 11 mg/dL (ref 8–27)
CHLORIDE: 97 mmol/L (ref 96–106)
CO2: 24 mmol/L (ref 18–29)
Calcium: 9.1 mg/dL (ref 8.7–10.3)
Creatinine, Ser: 0.79 mg/dL (ref 0.57–1.00)
GFR calc Af Amer: 94 mL/min/{1.73_m2} (ref >59)
GFR calc non Af Amer: 82 mL/min/{1.73_m2} (ref >59)
GLUCOSE: 96 mg/dL (ref 65–99)
POTASSIUM: 4.6 mmol/L (ref 3.5–5.2)
SODIUM: 136 mmol/L (ref 134–144)

## 2015-10-09 LAB — CBC WITH DIFFERENTIAL/PLATELET
BASOS ABS: 0 10*3/uL (ref 0.0–0.2)
Basos: 0 %
EOS (ABSOLUTE): 0.2 10*3/uL (ref 0.0–0.4)
Eos: 2 %
HEMOGLOBIN: 12.1 g/dL (ref 11.1–15.9)
Hematocrit: 38.1 % (ref 34.0–46.6)
Immature Grans (Abs): 0.1 10*3/uL (ref 0.0–0.1)
Immature Granulocytes: 1 %
LYMPHS ABS: 1.7 10*3/uL (ref 0.7–3.1)
Lymphs: 11 %
MCH: 28.5 pg (ref 26.6–33.0)
MCHC: 31.8 g/dL (ref 31.5–35.7)
MCV: 90 fL (ref 79–97)
MONOCYTES: 8 %
MONOS ABS: 1.2 10*3/uL — AB (ref 0.1–0.9)
Neutrophils Absolute: 12 10*3/uL — ABNORMAL HIGH (ref 1.4–7.0)
Neutrophils: 78 %
Platelets: 633 10*3/uL — ABNORMAL HIGH (ref 150–379)
RBC: 4.25 x10E6/uL (ref 3.77–5.28)
RDW: 14.8 % (ref 12.3–15.4)
WBC: 15.3 10*3/uL — AB (ref 3.4–10.8)

## 2015-10-09 LAB — T4, FREE: FREE T4: 1.57 ng/dL (ref 0.82–1.77)

## 2015-10-09 LAB — TSH: TSH: 13.7 u[IU]/mL — AB (ref 0.450–4.500)

## 2015-10-09 MED ORDER — METRONIDAZOLE 500 MG PO TABS: 500.0000 mg | ORAL_TABLET | Freq: Three times a day (TID) | ORAL | Status: DC

## 2015-10-09 NOTE — Addendum Note (Signed)
Addended by: Denyse Amass on: 10/09/2015 03:06 PM   Modules accepted: Orders, Medications

## 2015-10-10 ENCOUNTER — Other Ambulatory Visit: Payer: Medicare Other

## 2015-10-10 ENCOUNTER — Other Ambulatory Visit: Payer: Self-pay | Admitting: *Deleted

## 2015-10-10 DIAGNOSIS — R197 Diarrhea, unspecified: Secondary | ICD-10-CM | POA: Diagnosis not present

## 2015-10-10 DIAGNOSIS — Z8673 Personal history of transient ischemic attack (TIA), and cerebral infarction without residual deficits: Secondary | ICD-10-CM | POA: Diagnosis not present

## 2015-10-10 DIAGNOSIS — E039 Hypothyroidism, unspecified: Secondary | ICD-10-CM | POA: Diagnosis not present

## 2015-10-10 DIAGNOSIS — I10 Essential (primary) hypertension: Secondary | ICD-10-CM | POA: Diagnosis not present

## 2015-10-10 DIAGNOSIS — Z7984 Long term (current) use of oral hypoglycemic drugs: Secondary | ICD-10-CM | POA: Diagnosis not present

## 2015-10-10 DIAGNOSIS — Z87891 Personal history of nicotine dependence: Secondary | ICD-10-CM | POA: Diagnosis not present

## 2015-10-10 DIAGNOSIS — Z8719 Personal history of other diseases of the digestive system: Secondary | ICD-10-CM | POA: Diagnosis not present

## 2015-10-10 DIAGNOSIS — J449 Chronic obstructive pulmonary disease, unspecified: Secondary | ICD-10-CM | POA: Diagnosis not present

## 2015-10-10 DIAGNOSIS — K219 Gastro-esophageal reflux disease without esophagitis: Secondary | ICD-10-CM | POA: Diagnosis not present

## 2015-10-10 DIAGNOSIS — Z48815 Encounter for surgical aftercare following surgery on the digestive system: Secondary | ICD-10-CM | POA: Diagnosis not present

## 2015-10-10 DIAGNOSIS — E119 Type 2 diabetes mellitus without complications: Secondary | ICD-10-CM | POA: Diagnosis not present

## 2015-10-10 MED ORDER — LEVOTHYROXINE SODIUM 200 MCG PO TABS: 200.0000 ug | ORAL_TABLET | Freq: Every day | ORAL | Status: DC

## 2015-10-11 ENCOUNTER — Telehealth: Payer: Self-pay | Admitting: *Deleted

## 2015-10-11 DIAGNOSIS — I10 Essential (primary) hypertension: Secondary | ICD-10-CM | POA: Diagnosis not present

## 2015-10-11 DIAGNOSIS — E119 Type 2 diabetes mellitus without complications: Secondary | ICD-10-CM | POA: Diagnosis not present

## 2015-10-11 DIAGNOSIS — K219 Gastro-esophageal reflux disease without esophagitis: Secondary | ICD-10-CM | POA: Diagnosis not present

## 2015-10-11 DIAGNOSIS — J449 Chronic obstructive pulmonary disease, unspecified: Secondary | ICD-10-CM | POA: Diagnosis not present

## 2015-10-11 DIAGNOSIS — Z87891 Personal history of nicotine dependence: Secondary | ICD-10-CM | POA: Diagnosis not present

## 2015-10-11 DIAGNOSIS — E039 Hypothyroidism, unspecified: Secondary | ICD-10-CM | POA: Diagnosis not present

## 2015-10-11 DIAGNOSIS — Z8673 Personal history of transient ischemic attack (TIA), and cerebral infarction without residual deficits: Secondary | ICD-10-CM | POA: Diagnosis not present

## 2015-10-11 DIAGNOSIS — Z8719 Personal history of other diseases of the digestive system: Secondary | ICD-10-CM | POA: Diagnosis not present

## 2015-10-11 DIAGNOSIS — Z48815 Encounter for surgical aftercare following surgery on the digestive system: Secondary | ICD-10-CM | POA: Diagnosis not present

## 2015-10-11 DIAGNOSIS — Z7984 Long term (current) use of oral hypoglycemic drugs: Secondary | ICD-10-CM | POA: Diagnosis not present

## 2015-10-11 LAB — CLOSTRIDIUM DIFFICILE BY PCR: Toxigenic C. Difficile by PCR: NEGATIVE

## 2015-10-11 NOTE — Telephone Encounter (Signed)
Lets decrease metoprolol and lisinopril by 1/2, so have pt take 1/2 tablet of lisinopril daily and 1/2 tablet of metoprolol BID  To take blood pressure and pulse daily and record. To follow up in office in 1 week with record

## 2015-10-11 NOTE — Telephone Encounter (Signed)
Okay for PT order

## 2015-10-11 NOTE — Telephone Encounter (Signed)
Patient was seen in office on 10/08/2015 by Janett Billow for [facility] Follow up. Melanie with [facility] is calling requesting PT orders for 2 times per week for 1 week. Also stated that patient's BP is still running low. 10/11/15--90/60,  10/09/15- 92/58 and at [place] 10/08/15- 92/84. Next appointment is 11/14/2015 with Dr. Eulas Post. Please Advise.

## 2015-10-12 ENCOUNTER — Ambulatory Visit: Payer: Medicare Other | Admitting: Internal Medicine

## 2015-10-12 DIAGNOSIS — J449 Chronic obstructive pulmonary disease, unspecified: Secondary | ICD-10-CM | POA: Diagnosis not present

## 2015-10-12 DIAGNOSIS — Z8719 Personal history of other diseases of the digestive system: Secondary | ICD-10-CM | POA: Diagnosis not present

## 2015-10-12 DIAGNOSIS — Z48815 Encounter for surgical aftercare following surgery on the digestive system: Secondary | ICD-10-CM | POA: Diagnosis not present

## 2015-10-12 DIAGNOSIS — K219 Gastro-esophageal reflux disease without esophagitis: Secondary | ICD-10-CM | POA: Diagnosis not present

## 2015-10-12 DIAGNOSIS — E039 Hypothyroidism, unspecified: Secondary | ICD-10-CM | POA: Diagnosis not present

## 2015-10-12 DIAGNOSIS — I10 Essential (primary) hypertension: Secondary | ICD-10-CM | POA: Diagnosis not present

## 2015-10-12 DIAGNOSIS — Z7984 Long term (current) use of oral hypoglycemic drugs: Secondary | ICD-10-CM | POA: Diagnosis not present

## 2015-10-12 DIAGNOSIS — Z8673 Personal history of transient ischemic attack (TIA), and cerebral infarction without residual deficits: Secondary | ICD-10-CM | POA: Diagnosis not present

## 2015-10-12 DIAGNOSIS — Z87891 Personal history of nicotine dependence: Secondary | ICD-10-CM | POA: Diagnosis not present

## 2015-10-12 DIAGNOSIS — E119 Type 2 diabetes mellitus without complications: Secondary | ICD-10-CM | POA: Diagnosis not present

## 2015-10-12 MED ORDER — LISINOPRIL 10 MG PO TABS: ORAL_TABLET | ORAL | Status: DC

## 2015-10-12 MED ORDER — METOPROLOL TARTRATE 25 MG PO TABS: 12.5000 mg | ORAL_TABLET | Freq: Two times a day (BID) | ORAL | Status: DC

## 2015-10-12 NOTE — Telephone Encounter (Signed)
Spoke with Threasa Beards and she will discuss Medication change with patient. Updated medication list.  Appointment scheduled for next Thursday with Janett Billow. Verbal orders given for PT.

## 2015-10-16 DIAGNOSIS — Z7984 Long term (current) use of oral hypoglycemic drugs: Secondary | ICD-10-CM | POA: Diagnosis not present

## 2015-10-16 DIAGNOSIS — E119 Type 2 diabetes mellitus without complications: Secondary | ICD-10-CM | POA: Diagnosis not present

## 2015-10-16 DIAGNOSIS — J449 Chronic obstructive pulmonary disease, unspecified: Secondary | ICD-10-CM | POA: Diagnosis not present

## 2015-10-16 DIAGNOSIS — E039 Hypothyroidism, unspecified: Secondary | ICD-10-CM | POA: Diagnosis not present

## 2015-10-16 DIAGNOSIS — Z48815 Encounter for surgical aftercare following surgery on the digestive system: Secondary | ICD-10-CM | POA: Diagnosis not present

## 2015-10-16 DIAGNOSIS — Z8719 Personal history of other diseases of the digestive system: Secondary | ICD-10-CM | POA: Diagnosis not present

## 2015-10-16 DIAGNOSIS — I10 Essential (primary) hypertension: Secondary | ICD-10-CM | POA: Diagnosis not present

## 2015-10-16 DIAGNOSIS — Z87891 Personal history of nicotine dependence: Secondary | ICD-10-CM | POA: Diagnosis not present

## 2015-10-16 DIAGNOSIS — Z8673 Personal history of transient ischemic attack (TIA), and cerebral infarction without residual deficits: Secondary | ICD-10-CM | POA: Diagnosis not present

## 2015-10-16 DIAGNOSIS — K219 Gastro-esophageal reflux disease without esophagitis: Secondary | ICD-10-CM | POA: Diagnosis not present

## 2015-10-18 ENCOUNTER — Other Ambulatory Visit: Payer: Self-pay | Admitting: Nurse Practitioner

## 2015-10-18 ENCOUNTER — Telehealth: Payer: Self-pay

## 2015-10-18 ENCOUNTER — Encounter: Payer: Self-pay | Admitting: Nurse Practitioner

## 2015-10-18 ENCOUNTER — Ambulatory Visit (INDEPENDENT_AMBULATORY_CARE_PROVIDER_SITE_OTHER): Payer: Medicare Other | Admitting: Nurse Practitioner

## 2015-10-18 VITALS — BP 118/72 | HR 86 | Temp 98.1°F | Resp 17 | Ht 63.0 in | Wt 175.4 lb

## 2015-10-18 DIAGNOSIS — R197 Diarrhea, unspecified: Secondary | ICD-10-CM

## 2015-10-18 DIAGNOSIS — R300 Dysuria: Secondary | ICD-10-CM | POA: Diagnosis not present

## 2015-10-18 DIAGNOSIS — I10 Essential (primary) hypertension: Secondary | ICD-10-CM

## 2015-10-18 LAB — POCT URINALYSIS DIPSTICK
GLUCOSE UA: NEGATIVE
NITRITE UA: NEGATIVE
PH UA: 5
Protein, UA: 30
SPEC GRAV UA: 1.02
UROBILINOGEN UA: 1

## 2015-10-18 MED ORDER — ZOSTER VACCINE LIVE 19400 UNT/0.65ML ~~LOC~~ SUSR: 0.6500 mL | Freq: Once | SUBCUTANEOUS | Status: DC

## 2015-10-18 NOTE — Telephone Encounter (Signed)
A request was faxed to Myrtice Lauth asking that a CBC with diff and CMP be drawn at patient's next nurse visit.

## 2015-10-18 NOTE — Patient Instructions (Signed)
Stop lisinopril Cont to take blood pressure daily Will have [facility] obtain lab work To get urine here in office

## 2015-10-18 NOTE — Progress Notes (Signed)
Patient ID: Grace [name], female   DOB: [birth year], 60 y.o.   MRN: [id number]    PCP: Gildardo Cranker, DO  Advanced Directive information Does patient have an advance directive?: No, Would patient like information on creating an advanced directive?: No - patient declined information  Allergies  Allergen Reactions  . Codeine     REACTION: headaches  . Prozac [Fluoxetine Hcl] Other (See Comments)    Suicidal ideations    Chief Complaint  Patient presents with  . Medical Management of Chronic Issues    Blood pressure follow up. (home bp log in folder)      HPI: Patient is a 60 y.o. female seen in the office today to follow up blood pressure. Pt following by home health after hospitalization for acute appendicitis and sepsis.  Therapy noted low blood pressure so called the office, metoprolol and lisinopril were decreased. Today pts blood pressure and pulse normal in office but at home remains low. Was placed on flagyl after last visit due to diarrhea and elevated WBC. c diff was negative. Diarrhea has resolved.  Reports dark urine even though she is staying well hydrated.  Overall still feeling pretty bad Continues to have a fever of 99-102 throughout the day, no fever today.  No abdominal pain, just discomfort from her incision.  Still feeling weak, reports light headed at times Recent TSH still elevated Increased synthroid to 200 mcg which she is taking now.   Review of Systems:  Review of Systems  Constitutional: Positive for activity change and fatigue. Negative for chills.  Respiratory: Negative for chest tightness and shortness of breath.   Cardiovascular: Negative for chest pain, palpitations and leg swelling.  Gastrointestinal: Negative for nausea, vomiting, abdominal pain, diarrhea, blood in stool and abdominal distention.  Genitourinary: Negative for dysuria and difficulty urinating.  Skin: Negative for wound.  Neurological: Positive for weakness. Negative for  dizziness and light-headedness.  Psychiatric/Behavioral: Positive for dysphoric mood.  All other systems reviewed and are negative.   Past Medical History  Diagnosis Date  . GERD (gastroesophageal reflux disease)   . Hyperlipidemia   . Hypertension   . Migraine   . COPD (chronic obstructive pulmonary disease) ([place])   . Hypothyroidism     with hx of multinodular goiter (noted on neck US in 11/2002 - showing diffuse nodularity and inhomogenous texture diffusely BL)  . Asthma   . Hiatal hernia   . Barrett's esophagus     with high grade dysplasia per endoscopy 05/2005 // followed by Dr. [name] ([facility])  . History of CVA (cerebrovascular accident) 10/2000  . Endometrial polyp 03/2003    s/p resection in 03/2003. Path showing submucosal leiomyoma and benign  proliferative type endometrium  . Duodenal ulcer disease     thought to be contributed at least partly by overuse of NSAIDs and salicylates (goody powdr)  . Internal hemorrhoids     grade 2 per colonoscopy in 09/2006 - repeat colonoscopy rec in 5-10 years.  . 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 Fibroid uterus     s/p myomectomy x 2  . Tachycardia   . Nevus   . H/O failed moderate sedation   . Personal history of failed conscious sedation 08/11/2011  . Thyroid disease   . Chronic back pain   . Abnormal vaginal bleeding   . Tobacco abuse Jan  2014    quit  . Diabetes mellitus without complication [facility])    Past Surgical History  Procedure Laterality Date  . Cholecystectomy    . Myomectomy  1990, 1997    x 2 - In 1997, noted to have extensive pelvic adhesions and BL tubal obstruction  . Colonoscopy  02/07/2010  . Upper gastrointestinal endoscopy  12/18/2009  . Hysteroscopy    . Laparoscopic appendectomy N/A 09/23/2015    Procedure: APPENDECTOMY LAPAROSCOPIC;  Surgeon: Georganna Skeans, MD;  Location: [place];  Service: General;   Laterality: N/A;   Social History:   reports that she quit smoking about 3 years ago. Her smoking use included Cigarettes. She has a 42 pack-year smoking history. She has never used smokeless tobacco. She reports that she does not drink alcohol or use illicit drugs.  Family History  Problem Relation Age of Onset  . COPD Mother   . Heart disease Father     had 5 heart attacks, first at age 71s.  Marland Kitchen COPD Father   .       Medications: Patient's Medications  New Prescriptions   No medications on file  Previous Medications   ALBUTEROL (PROAIR HFA) 108 (90 BASE) MCG/ACT INHALER    Inhale 2 puffs into the lungs every 6 (six) hours as needed for wheezing or shortness of breath.   ASPIRIN-ACETAMINOPHEN-CAFFEINE [id number] MG PACK    Take 1 Package by mouth as needed.    ATORVASTATIN (LIPITOR) 80 MG TABLET    Take one tablet by mouth once daily at bedtime for cholesterol   DIPHENHYDRAMINE (BENADRYL) 25 MG TABLET    Take 25 mg by mouth daily as needed for sleep. Reported on 10/08/2015   GLIPIZIDE (GLUCOTROL XL) 5 MG 24 HR TABLET    Take one tablet by mouth once daily to control blood sugar   GLUCOSE BLOOD (ONETOUCH VERIO) TEST STRIP    Use to self monitor blood glucose twice daily and as directed (E11.9)   IBUPROFEN (ADVIL,MOTRIN) 200 MG TABLET    Take 600 mg by mouth as needed for moderate pain.    KETOTIFEN FUMARATE (REFRESH EYE ITCH RELIEF OP)    Place 1 drop into both eyes daily as needed. For itchy eyes   LEVOTHYROXINE (SYNTHROID) 200 MCG TABLET    Take 1 tablet (200 mcg total) by mouth daily before breakfast.   LISINOPRIL (PRINIVIL,ZESTRIL) 10 MG TABLET    Take 1/2 tablet by mouth once daily for blood pressure   LORAZEPAM (ATIVAN) 0.5 MG TABLET    Take one tablet by mouth three times daily as needed for anxiety or nervousness   METOPROLOL TARTRATE (LOPRESSOR) 25 MG TABLET    Take 0.5 tablets (12.5 mg total) by mouth 2 (two) times daily.   METRONIDAZOLE (FLAGYL) 500 MG TABLET    Take 1 tablet  (500 mg total) by mouth 3 (three) times daily.   OMEPRAZOLE (PRILOSEC) 20 MG CAPSULE    Take 1 capsule (20 mg total) by mouth daily.   ONETOUCH DELICA LANCETS 41Y MISC    1 each by Does not apply route 2 (two) times daily.   OXYCODONE-ACETAMINOPHEN (PERCOCET/ROXICET) 5-325 MG TABLET       TRAZODONE (DESYREL) 100 MG TABLET    Take 1 tablet (100 mg total) by mouth at bedtime.   UMECLIDINIUM-VILANTEROL (ANORO ELLIPTA) 62.5-25 MCG/INH AEPB    Inhale 1 puff into the lungs daily.  Modified Medications   Modified Medication Previous Medication   ZOSTER VACCINE LIVE, PF, (ZOSTAVAX)  19400 UNT/0.65ML INJECTION Zoster Vaccine Live, PF, (ZOSTAVAX) 78242 UNT/0.65ML injection      Inject 19,400 Units into the skin once.    Inject 0.65 mLs into the skin once.  Discontinued Medications   FLUTICASONE (FLOVENT HFA) 44 MCG/ACT INHALER    INHALE TWO PUFFS INTO LUNGS TWICE DAILY   IPRATROPIUM-ALBUTEROL (DUONEB) 0.5-2.5 (3) MG/3ML SOLN    Take 3 mLs by nebulization every 6 (six) hours as needed (shortness of breath, wheezing).   MULTIPLE VITAMINS-MINERALS (HAIR/SKIN/NAILS) TABS    Take 1 tablet by mouth daily. Reported on 10/08/2015   RESPIRATORY THERAPY SUPPLIES (NEBULIZER/TUBING/MOUTHPIECE) KIT    1 nebulizer every 12 hours. DX: J44.9     Physical Exam:  Filed Vitals:   10/18/15 1426  BP: 118/72  Pulse: 86  Temp: 98.1 F (36.7 C)  TempSrc: Oral  Resp: 17  Height: 5' 3"  (1.6 m)  Weight: 175 lb 6.4 oz (79.561 kg)  SpO2: 96%   Body mass index is 31.08 kg/(m^2).  Physical Exam  Constitutional: She is oriented to person, place, and time. She appears well-developed and well-nourished. No distress.  HENT:  Mouth/Throat: Oropharynx is clear and moist.  Cardiovascular: Normal rate, regular rhythm, normal heart sounds and intact distal pulses.  Exam reveals no gallop and no friction rub.   No murmur heard. Pulmonary/Chest: Effort normal and breath sounds normal. No stridor. No respiratory distress.    Abdominal: Soft. Bowel sounds are normal. There is tenderness (mild tenderness throughtout).  Musculoskeletal: She exhibits no edema or tenderness.  Neurological: She is alert and oriented to person, place, and time.  Skin: Skin is warm and dry. No rash noted. There is pallor.  Psychiatric: She has a normal mood and affect. Her behavior is normal. Thought content normal.    Labs reviewed: Basic Metabolic Panel:  Recent Labs  07/02/15 0924 08/15/15 0907  09/26/15 0529 09/27/15 0602 09/28/15 0630 09/30/15 0522 10/01/15 0831 10/08/15 1407  NA 140  --   < > 139 136 140  --  132* 136  K 4.3  --   < > 3.1* 3.2* 4.0  --  4.8 4.6  CL 98  --   < > 98* 93* 98*  --  95* 97  CO2 24  --   < > 34* 33* 34*  --  29 24  GLUCOSE 95  --   < > 164* 155* 117*  --  115* 96  BUN 14  --   < > 17 14 13   --  8 11  CREATININE 0.70  --   < > 0.94 0.66 0.85 0.75 0.68 0.79  CALCIUM 9.4  --   < > 9.0 8.5* 8.9  --  8.8* 9.1  MG  --   --   < > 1.9 1.9 1.7  --   --   --   PHOS  --   --   --  2.1* 2.3* 2.6  --   --   --   TSH 14.880* 23.300*  --   --   --   --   --   --  13.700*  < > = values in this interval not displayed. Liver Function Tests:  Recent Labs  11/01/14 1005 07/02/15 0924 09/23/15 1157  AST 27 23 16   ALT 19 12 12*  ALKPHOS 58 85 78  BILITOT 0.2 <0.2 0.7  PROT 6.7 6.8 6.1*  ALBUMIN 4.4 4.2 3.0*    Recent Labs  10/27/14 1822  LIPASE 29  No results for input(s): AMMONIA in the last 8760 hours. CBC:  Recent Labs  07/02/15 0924 09/23/15 1157  09/27/15 0602 09/28/15 0630 10/01/15 0831 10/08/15 1407  WBC 8.7 19.3*  < > 12.4* 12.3* 18.8* 15.3*  NEUTROABS 5.5 16.0*  --   --   --   --  12.0*  HGB  --  13.5  < > 10.7* 11.3* 12.2  --   HCT 43.2 42.9  < > 35.7* 37.4 40.8 38.1  MCV 88 90.1  < > 90.6 91.4 93.2 90  PLT 260 211  < > 167 198 300 633*  < > = values in this interval not displayed. Lipid Panel:  Recent Labs  01/09/15 0820 07/02/15 0924  CHOL 127 142  HDL 47 58   LDLCALC 55 64  TRIG 125 99  CHOLHDL 2.7 2.4   TSH:  Recent Labs  07/02/15 0924 08/15/15 0907 10/08/15 1407  TSH 14.880* 23.300* 13.700*   A1C: Lab Results  Component Value Date   HGBA1C 6.1* 07/02/2015     Assessment/Plan 1. Diarrhea, unspecified type Has resolved at this time  2. Essential hypertension Blood pressure still on the low side at home. Noted increase HR, EKG in NSR today -will have pt stop taking lisinopril and cont to monitor blood pressure  - EKG 12-Lead- normal sinus rhythm   3. Dysuria -dark urine noted, staying hydrated  - POCT urinalysis dipstick and will send off for UA C&S  4. Leukocytosis Elevated after last exam, will follow up CBC with diff -will send urine off for culture -no longer having diarrhea.  No abdominal pain   5. Hypothyroid -TSH remained elevated, synthroid increased and pt currently taking 200 mcg daily  To keep follow up with Dr Eulas Post in 4 weeks, follow up sooner if needed  Anthonymichael Munday K. Harle Battiest  [facility] [phone] 8 am - 5 pm) [phone] (after hours)

## 2015-10-19 ENCOUNTER — Telehealth: Payer: Self-pay

## 2015-10-19 DIAGNOSIS — Z8719 Personal history of other diseases of the digestive system: Secondary | ICD-10-CM | POA: Diagnosis not present

## 2015-10-19 DIAGNOSIS — I10 Essential (primary) hypertension: Secondary | ICD-10-CM | POA: Diagnosis not present

## 2015-10-19 DIAGNOSIS — E119 Type 2 diabetes mellitus without complications: Secondary | ICD-10-CM | POA: Diagnosis not present

## 2015-10-19 DIAGNOSIS — K219 Gastro-esophageal reflux disease without esophagitis: Secondary | ICD-10-CM | POA: Diagnosis not present

## 2015-10-19 DIAGNOSIS — Z8673 Personal history of transient ischemic attack (TIA), and cerebral infarction without residual deficits: Secondary | ICD-10-CM | POA: Diagnosis not present

## 2015-10-19 DIAGNOSIS — Z48815 Encounter for surgical aftercare following surgery on the digestive system: Secondary | ICD-10-CM | POA: Diagnosis not present

## 2015-10-19 DIAGNOSIS — E039 Hypothyroidism, unspecified: Secondary | ICD-10-CM | POA: Diagnosis not present

## 2015-10-19 DIAGNOSIS — Z87891 Personal history of nicotine dependence: Secondary | ICD-10-CM | POA: Diagnosis not present

## 2015-10-19 DIAGNOSIS — Z7984 Long term (current) use of oral hypoglycemic drugs: Secondary | ICD-10-CM | POA: Diagnosis not present

## 2015-10-19 DIAGNOSIS — J449 Chronic obstructive pulmonary disease, unspecified: Secondary | ICD-10-CM | POA: Diagnosis not present

## 2015-10-19 LAB — BASIC METABOLIC PANEL
BUN: 14 mg/dL (ref 4–21)
Creatinine: 0.8 mg/dL (ref 0.5–1.1)
Glucose: 171 mg/dL
Potassium: 4.1 mmol/L (ref 3.4–5.3)
Sodium: 134 mmol/L — AB (ref 137–147)

## 2015-10-19 LAB — CBC AND DIFFERENTIAL
HEMATOCRIT: 32 % — AB (ref 36–46)
HEMOGLOBIN: 10.2 g/dL — AB (ref 12.0–16.0)
PLATELETS: 380 10*3/uL (ref 150–399)
WBC: 14 10*3/mL

## 2015-10-19 LAB — HEPATIC FUNCTION PANEL
ALT: 8 U/L (ref 7–35)
AST: 12 U/L — AB (ref 13–35)
Alkaline Phosphatase: 77 U/L (ref 25–125)
BILIRUBIN, TOTAL: 0.2 mg/dL

## 2015-10-19 NOTE — Telephone Encounter (Signed)
Order was received for labs- unable to read. Grace [name] is requesting order to be re-faxed. I located order that was faxed yesterday and wrote them (CMP and CBCD) on the cover sheet of the fax. Re-faxed

## 2015-10-20 LAB — URINE CULTURE
Colony Count: NO GROWTH
ORGANISM ID, BACTERIA: NO GROWTH

## 2015-10-22 ENCOUNTER — Other Ambulatory Visit: Payer: Self-pay

## 2015-10-22 ENCOUNTER — Encounter: Payer: Self-pay | Admitting: *Deleted

## 2015-10-22 DIAGNOSIS — D72829 Elevated white blood cell count, unspecified: Secondary | ICD-10-CM

## 2015-10-24 DIAGNOSIS — Z8719 Personal history of other diseases of the digestive system: Secondary | ICD-10-CM | POA: Diagnosis not present

## 2015-10-24 DIAGNOSIS — E039 Hypothyroidism, unspecified: Secondary | ICD-10-CM | POA: Diagnosis not present

## 2015-10-24 DIAGNOSIS — K219 Gastro-esophageal reflux disease without esophagitis: Secondary | ICD-10-CM | POA: Diagnosis not present

## 2015-10-24 DIAGNOSIS — J449 Chronic obstructive pulmonary disease, unspecified: Secondary | ICD-10-CM | POA: Diagnosis not present

## 2015-10-24 DIAGNOSIS — Z8673 Personal history of transient ischemic attack (TIA), and cerebral infarction without residual deficits: Secondary | ICD-10-CM | POA: Diagnosis not present

## 2015-10-24 DIAGNOSIS — I10 Essential (primary) hypertension: Secondary | ICD-10-CM | POA: Diagnosis not present

## 2015-10-24 DIAGNOSIS — Z7984 Long term (current) use of oral hypoglycemic drugs: Secondary | ICD-10-CM | POA: Diagnosis not present

## 2015-10-24 DIAGNOSIS — E119 Type 2 diabetes mellitus without complications: Secondary | ICD-10-CM | POA: Diagnosis not present

## 2015-10-24 DIAGNOSIS — Z48815 Encounter for surgical aftercare following surgery on the digestive system: Secondary | ICD-10-CM | POA: Diagnosis not present

## 2015-10-24 DIAGNOSIS — Z87891 Personal history of nicotine dependence: Secondary | ICD-10-CM | POA: Diagnosis not present

## 2015-10-29 DIAGNOSIS — Z8673 Personal history of transient ischemic attack (TIA), and cerebral infarction without residual deficits: Secondary | ICD-10-CM | POA: Diagnosis not present

## 2015-10-29 DIAGNOSIS — J449 Chronic obstructive pulmonary disease, unspecified: Secondary | ICD-10-CM | POA: Diagnosis not present

## 2015-10-29 DIAGNOSIS — I10 Essential (primary) hypertension: Secondary | ICD-10-CM | POA: Diagnosis not present

## 2015-10-29 DIAGNOSIS — Z87891 Personal history of nicotine dependence: Secondary | ICD-10-CM | POA: Diagnosis not present

## 2015-10-29 DIAGNOSIS — Z8719 Personal history of other diseases of the digestive system: Secondary | ICD-10-CM | POA: Diagnosis not present

## 2015-10-29 DIAGNOSIS — Z48815 Encounter for surgical aftercare following surgery on the digestive system: Secondary | ICD-10-CM | POA: Diagnosis not present

## 2015-10-29 DIAGNOSIS — K219 Gastro-esophageal reflux disease without esophagitis: Secondary | ICD-10-CM | POA: Diagnosis not present

## 2015-10-29 DIAGNOSIS — E119 Type 2 diabetes mellitus without complications: Secondary | ICD-10-CM | POA: Diagnosis not present

## 2015-10-29 DIAGNOSIS — E039 Hypothyroidism, unspecified: Secondary | ICD-10-CM | POA: Diagnosis not present

## 2015-10-29 DIAGNOSIS — Z7984 Long term (current) use of oral hypoglycemic drugs: Secondary | ICD-10-CM | POA: Diagnosis not present

## 2015-10-31 ENCOUNTER — Encounter: Payer: Self-pay | Admitting: Internal Medicine

## 2015-10-31 DIAGNOSIS — J9601 Acute respiratory failure with hypoxia: Secondary | ICD-10-CM | POA: Diagnosis not present

## 2015-11-06 NOTE — Addendum Note (Signed)
Addended by: Moshe Cipro Charda Janis A on: 11/06/2015 02:34 PM   Modules accepted: Orders

## 2015-11-07 ENCOUNTER — Other Ambulatory Visit: Payer: Medicare Other

## 2015-11-07 DIAGNOSIS — D72829 Elevated white blood cell count, unspecified: Secondary | ICD-10-CM

## 2015-11-07 DIAGNOSIS — E039 Hypothyroidism, unspecified: Secondary | ICD-10-CM

## 2015-11-07 LAB — CBC WITH DIFFERENTIAL/PLATELET
BASOS ABS: 58 {cells}/uL (ref 0–200)
Basophils Relative: 1 %
EOS PCT: 7 %
Eosinophils Absolute: 406 cells/uL (ref 15–500)
HCT: 38.1 % (ref 35.0–45.0)
Hemoglobin: 12.1 g/dL (ref 11.7–15.5)
Lymphocytes Relative: 31 %
Lymphs Abs: 1798 cells/uL (ref 850–3900)
MCH: 27.9 pg (ref 27.0–33.0)
MCHC: 31.8 g/dL — AB (ref 32.0–36.0)
MCV: 87.8 fL (ref 80.0–100.0)
MONOS PCT: 9 %
MPV: 11.5 fL (ref 7.5–12.5)
Monocytes Absolute: 522 cells/uL (ref 200–950)
NEUTROS PCT: 52 %
Neutro Abs: 3016 cells/uL (ref 1500–7800)
PLATELETS: 313 10*3/uL (ref 140–400)
RBC: 4.34 MIL/uL (ref 3.80–5.10)
RDW: 16 % — AB (ref 11.0–15.0)
WBC: 5.8 10*3/uL (ref 3.8–10.8)

## 2015-11-07 LAB — T4, FREE: Free T4: 1.9 ng/dL — ABNORMAL HIGH (ref 0.8–1.8)

## 2015-11-07 LAB — TSH: TSH: 0.18 m[IU]/L — AB

## 2015-11-08 ENCOUNTER — Other Ambulatory Visit: Payer: Self-pay

## 2015-11-08 DIAGNOSIS — E0590 Thyrotoxicosis, unspecified without thyrotoxic crisis or storm: Secondary | ICD-10-CM

## 2015-11-08 MED ORDER — LEVOTHYROXINE SODIUM 100 MCG PO TABS: 100.0000 ug | ORAL_TABLET | Freq: Every day | ORAL | 2 refills | Status: DC

## 2015-11-08 MED ORDER — LEVOTHYROXINE SODIUM 88 MCG PO TABS: 88.0000 ug | ORAL_TABLET | Freq: Every day | ORAL | 2 refills | Status: DC

## 2015-11-08 NOTE — Telephone Encounter (Signed)
Doses for levothyroxine were changed by Dr. Eulas Post based on lab results. Patient is to take 100 mcg and 88 mcg (total of 188 mcg) each day. Refills have been placed for each new dose. #30 with 2 Rf

## 2015-11-14 ENCOUNTER — Encounter: Payer: Self-pay | Admitting: Internal Medicine

## 2015-11-14 ENCOUNTER — Ambulatory Visit (INDEPENDENT_AMBULATORY_CARE_PROVIDER_SITE_OTHER): Payer: Medicare Other | Admitting: Internal Medicine

## 2015-11-14 ENCOUNTER — Telehealth: Payer: Self-pay | Admitting: *Deleted

## 2015-11-14 VITALS — BP 144/86 | HR 74 | Temp 98.2°F | Ht 63.0 in

## 2015-11-14 DIAGNOSIS — F4321 Adjustment disorder with depressed mood: Secondary | ICD-10-CM | POA: Diagnosis not present

## 2015-11-14 DIAGNOSIS — E785 Hyperlipidemia, unspecified: Secondary | ICD-10-CM

## 2015-11-14 DIAGNOSIS — E038 Other specified hypothyroidism: Secondary | ICD-10-CM | POA: Diagnosis not present

## 2015-11-14 DIAGNOSIS — J449 Chronic obstructive pulmonary disease, unspecified: Secondary | ICD-10-CM | POA: Diagnosis not present

## 2015-11-14 DIAGNOSIS — M545 Low back pain, unspecified: Secondary | ICD-10-CM

## 2015-11-14 DIAGNOSIS — J45909 Unspecified asthma, uncomplicated: Secondary | ICD-10-CM | POA: Diagnosis not present

## 2015-11-14 DIAGNOSIS — E034 Atrophy of thyroid (acquired): Secondary | ICD-10-CM | POA: Diagnosis not present

## 2015-11-14 DIAGNOSIS — I10 Essential (primary) hypertension: Secondary | ICD-10-CM | POA: Diagnosis not present

## 2015-11-14 DIAGNOSIS — E119 Type 2 diabetes mellitus without complications: Secondary | ICD-10-CM | POA: Diagnosis not present

## 2015-11-14 DIAGNOSIS — G8929 Other chronic pain: Secondary | ICD-10-CM | POA: Diagnosis not present

## 2015-11-14 LAB — LIPID PANEL
CHOL/HDL RATIO: 2.9 ratio (ref <=5.0)
Cholesterol: 132 mg/dL (ref 125–200)
HDL: 45 mg/dL — ABNORMAL LOW (ref >=46)
LDL Cholesterol: 65 mg/dL (ref <130)
Triglycerides: 112 mg/dL (ref <150)
VLDL: 22 mg/dL (ref <30)

## 2015-11-14 LAB — COMPLETE METABOLIC PANEL WITH GFR
ALBUMIN: 3.5 g/dL — AB (ref 3.6–5.1)
ALK PHOS: 76 U/L (ref 33–130)
ALT: 9 U/L (ref 6–29)
AST: 14 U/L (ref 10–35)
BILIRUBIN TOTAL: 0.3 mg/dL (ref 0.2–1.2)
BUN: 15 mg/dL (ref 7–25)
CO2: 25 mmol/L (ref 20–31)
CREATININE: 0.84 mg/dL (ref 0.50–0.99)
Calcium: 9.3 mg/dL (ref 8.6–10.4)
Chloride: 107 mmol/L (ref 98–110)
GFR, Est African American: 87 mL/min (ref >=60)
GFR, Est Non African American: 76 mL/min (ref >=60)
GLUCOSE: 53 mg/dL — AB (ref 65–99)
Potassium: 4.7 mmol/L (ref 3.5–5.3)
SODIUM: 143 mmol/L (ref 135–146)
TOTAL PROTEIN: 6.8 g/dL (ref 6.1–8.1)

## 2015-11-14 LAB — HEMOGLOBIN A1C
HEMOGLOBIN A1C: 6.6 % — AB (ref <5.7)
Mean Plasma Glucose: 143 mg/dL

## 2015-11-14 MED ORDER — ZOSTER VACCINE LIVE 19400 UNT/0.65ML ~~LOC~~ SUSR
0.6500 mL | Freq: Once | SUBCUTANEOUS | 0 refills | Status: AC
Start: 2015-11-14 — End: 2015-11-14

## 2015-11-14 NOTE — Telephone Encounter (Signed)
Ok to dc O2 

## 2015-11-14 NOTE — Progress Notes (Signed)
Patient ID: Grace [name], female   DOB: October 20, 1955, 60 y.o.   MRN: [id number]    Location:  [facility] Place of Service: OFFICE  Chief Complaint  Patient presents with  . Medical Management of Chronic Issues    1 month follow-up. DM foot exam due   . Edema    Feet and ankle swelling x 4 days   . Medication Management    Side effects to Anora (inhaler), stopped on Friday   . Back Pain    Ongoing lower back pain, pain is worse x several weeks.     HPI:  60 yo female seen today for f/u. She had CP with anora and stopped using it. She no longer has CP. Uses HFA more. She has never seen pulmonologist. She had swelling in feet but that has resolved. She has chronic back pain that interferes with ADLs and keeping house clean. She has to rest frequently due to inability to stand/walk for prolonged time. She has not had any imaging done recently. She is still not smoking. She quit in June 2017  grief reaction - over mother's death in 18-Dec-2014. (+) insomnia and wakes up every hr. No relief with lorazepam. Takes melatonin 49m. She takes trazodone for insomnia but wakes up every 2-3 hrs. She has suicidal thoughts. She sees counselor through hospice.  COPD/asthma hx - no recent exacerbations. She takes flovent, proair. [facility]   Hypothyroid - TSH 0.18; Free T4 1. Levothyroxine decreased to 1854m daily about 1 week ago. (+) hot intolerance. She reports soft nails and thinning hair.  Tobacco abuse - she stopped smoking but is now eating more. Weight 196-->173-->175 lbs since June 2017  HTN - Bp fluctuating at home 110-140s/60-70s usually and occasional SBP 160s. Her lisinopril was stopped last month due to low BPs  DM - A1c 6.1%. She takes glipizide. No low BS reactions  Hyperlipidemia - stable on lipitor. LDL  Protein calorie malnutrition - albumin 2.8 last month   Past Medical History:  Diagnosis Date  . Abnormal vaginal bleeding   . Asthma    . Barrett's esophagus    with high grade dysplasia per endoscopy 05/2005 // followed by [facility]. [facility])  . Chronic back pain   . COPD (chronic obstructive pulmonary disease) ([facility]  . Diabetes mellitus without complication ([facility]  . Duodenal ulcer disease    thought to be contributed at least partly by overuse of NSAIDs and salicylates (goody powdr)  . Endometrial polyp 03/2003   s/p resection in 03/2003. Path showing submucosal leiomyoma and benign  proliferative type endometrium  . Fibroid uterus    s/p myomectomy x 2  . GERD (gastroesophageal reflux disease)   . H/O failed moderate sedation   . Hiatal hernia   . History of CVA (cerebrovascular accident) 10/2000  . 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 Hyperlipidemia   . Hypertension   . Hypothyroidism    with hx of multinodular goiter (noted on neck [place] 0805-Sep-2004 showing diffuse nodularity and inhomogenous texture diffusely BL)  . Internal hemorrhoids    grade 2 per colonoscopy in 09/2006 - repeat colonoscopy rec in 5-10 years.  . Migraine   . Nevus   . Personal history of failed conscious sedation 08/11/2011  . Tachycardia   .  Thyroid disease   . Tobacco abuse Jan 2014   quit    Past Surgical History:  Procedure Laterality Date  . CHOLECYSTECTOMY    . COLONOSCOPY  02/07/2010  . HYSTEROSCOPY    . LAPAROSCOPIC APPENDECTOMY N/A 09/23/2015   Procedure: APPENDECTOMY LAPAROSCOPIC;  Surgeon: Georganna Skeans, MD;  Location: [place];  Service: General;  Laterality: N/A;  . [place]   x 2 - In 1997, noted to have extensive pelvic adhesions and BL tubal obstruction  . UPPER GASTROINTESTINAL ENDOSCOPY  12/18/2009    Patient Care Team: Gildardo Cranker, DO as PCP - General (Internal Medicine)  Social History   Social History  . Marital status: Divorced    Spouse name: N/A  . Number of children: N/A  . Years of  education: N/A   Occupational History  . Not on file.   Social History Main Topics  . Smoking status: Former Smoker    Packs/day: 1.00    Years: 42.00    Types: Cigarettes    Quit date: 05/09/2012  . Smokeless tobacco: Never Used     Comment: Counseling sheet [id number]   . Alcohol use No  . Drug use: No  . Sexual activity: No   Other Topics Concern  . Not on file   Social History Narrative         Financial assistance approved for 100% discount at [facility] and has [facility] card per Bonna Gains   01/15/2010      Diet- N/A   Caffeine- Goody powders   Married-Divorced   [employer] and lives with mother   Pets-2   Current/past profession-[employer]   Exercise-No   Living will-No   DNR-No   POA/HPOA-No              reports that she quit smoking about 3 years ago. Her smoking use included Cigarettes. She has a 42.00 pack-year smoking history. She has never used smokeless tobacco. She reports that she does not drink alcohol or use drugs.  Family History  Problem Relation Age of Onset  . COPD Mother   . Heart disease Father     had 5 heart attacks, first at age 22s.  Marland Kitchen COPD Father    Family Status  Relation Status  . Mother Deceased  . Father Deceased at age 46     Allergies  Allergen Reactions  . Codeine     REACTION: headaches  . Prozac [Fluoxetine Hcl] Other (See Comments)    Suicidal ideations    Medications: Patient's Medications  New Prescriptions   No medications on file  Previous Medications   ALBUTEROL (PROAIR HFA) 108 (90 BASE) MCG/ACT INHALER    Inhale 2 puffs into the lungs every 6 (six) hours as needed for wheezing or shortness of breath.   ASPIRIN-ACETAMINOPHEN-CAFFEINE 559-741-63 MG PACK    Take 1 Package by mouth as needed.    ATORVASTATIN (LIPITOR) 80 MG TABLET    Take one tablet by mouth once daily at bedtime for cholesterol   DIPHENHYDRAMINE (BENADRYL) 25 MG TABLET    Take 25 mg by mouth daily as needed for sleep. Reported on 10/08/2015   GLIPIZIDE  (GLUCOTROL XL) 5 MG 24 HR TABLET    Take one tablet by mouth once daily to control blood sugar   GLUCOSE BLOOD (ONETOUCH VERIO) TEST STRIP    Use to self monitor blood glucose twice daily and as directed (E11.9)   IBUPROFEN (ADVIL,MOTRIN) 200 MG TABLET    Take 600 mg  by mouth as needed for moderate pain.    KETOTIFEN FUMARATE (REFRESH EYE ITCH RELIEF OP)    Place 1 drop into both eyes daily as needed. For itchy eyes   LEVOTHYROXINE (SYNTHROID, LEVOTHROID) 100 MCG TABLET    Take 1 tablet (100 mcg total) by mouth daily before breakfast. Take along with 88 mcg levothyroxine once daily.   LEVOTHYROXINE (SYNTHROID, LEVOTHROID) 88 MCG TABLET    Take 1 tablet (88 mcg total) by mouth daily before breakfast. Take along with levothyroxine 126m once daily.   LISINOPRIL (PRINIVIL,ZESTRIL) 10 MG TABLET    Take 1/2 tablet by mouth once daily for blood pressure   LORAZEPAM (ATIVAN) 0.5 MG TABLET    Take one tablet by mouth three times daily as needed for anxiety or nervousness   METOPROLOL TARTRATE (LOPRESSOR) 25 MG TABLET    Take 0.5 tablets (12.5 mg total) by mouth 2 (two) times daily.   OMEPRAZOLE (PRILOSEC) 20 MG CAPSULE    Take 1 capsule (20 mg total) by mouth daily.   ONETOUCH DELICA LANCETS 301UMISC    1 each by Does not apply route 2 (two) times daily.   OXYCODONE-ACETAMINOPHEN (PERCOCET/ROXICET) 5-325 MG TABLET    As needed, rx'ed after surgery   TRAZODONE (DESYREL) 100 MG TABLET    Take 1 tablet (100 mg total) by mouth at bedtime.   ZOSTER VACCINE LIVE, PF, (ZOSTAVAX) 127253UNT/0.65ML INJECTION    Inject 19,400 Units into the skin once.  Modified Medications   No medications on file  Discontinued Medications   METRONIDAZOLE (FLAGYL) 500 MG TABLET    Take 1 tablet (500 mg total) by mouth 3 (three) times daily.   UMECLIDINIUM-VILANTEROL (ANORO ELLIPTA) 62.5-25 MCG/INH AEPB    Inhale 1 puff into the lungs daily.    Review of Systems  Constitutional: Positive for activity change and fatigue.    Respiratory: Positive for shortness of breath.   Cardiovascular: Negative for chest pain.  Endocrine: Positive for heat intolerance.  Neurological: Positive for weakness.  Psychiatric/Behavioral: Positive for dysphoric mood and sleep disturbance.  All other systems reviewed and are negative.   Vitals:   11/14/15 0856  BP: (!) 144/86  Pulse: 74  Temp: 98.2 F (36.8 C)  TempSrc: Oral  SpO2: 96%  Height: 5' 3"  (1.6 m)   There is no height or weight on file to calculate BMI.  Physical Exam  Constitutional: She appears well-developed and well-nourished.  HENT:  Mouth/Throat: Oropharynx is clear and moist. No oropharyngeal exudate.  Eyes: Pupils are equal, round, and reactive to light. No scleral icterus.  Neck: Neck supple. Carotid bruit is not present. No tracheal deviation present. No thyromegaly present.  Cardiovascular: Normal rate, regular rhythm, normal heart sounds and intact distal pulses.  Exam reveals no gallop and no friction rub.   No murmur heard. Trace LE edema b/l. no calf TTP.   Pulmonary/Chest: Effort normal and breath sounds normal. No stridor. No respiratory distress. She has no wheezes. She has no rales.  Reduced BS at base b/l  Abdominal: Soft. Bowel sounds are normal. She exhibits no distension and no mass. There is no hepatomegaly. There is tenderness (epigastric). There is no rebound and no guarding.  Musculoskeletal: She exhibits edema and tenderness.       Back:  Lymphadenopathy:    She has no cervical adenopathy.  Neurological: She is alert.  Skin: Skin is warm and dry. No lesion and no rash noted.  Psychiatric: Her speech is normal and behavior  is normal. Judgment and thought content normal. She exhibits a depressed mood.   Diabetic Foot Exam - Simple   Simple Foot Form Diabetic Foot exam was performed with the following findings:  Yes 11/14/2015  5:07 PM  Visual Inspection See comments:  Yes Sensation Testing Intact to touch and monofilament  testing bilaterally:  Yes Pulse Check Posterior Tibialis and Dorsalis pulse intact bilaterally:  Yes Comments Bunion b/l but no ulceration/calluses      Labs reviewed: Lab on 11/07/2015  Component Date Value Ref Range Status  . WBC 11/07/2015 5.8  3.8 - 10.8 K/uL Final  . RBC 11/07/2015 4.34  3.80 - 5.10 MIL/uL Final  . Hemoglobin 11/07/2015 12.1  11.7 - 15.5 g/dL Final  . HCT 11/07/2015 38.1  35.0 - 45.0 % Final  . MCV 11/07/2015 87.8  80.0 - 100.0 fL Final  . MCH 11/07/2015 27.9  27.0 - 33.0 pg Final  . MCHC 11/07/2015 31.8* 32.0 - 36.0 g/dL Final  . RDW 11/07/2015 16.0* 11.0 - 15.0 % Final  . Platelets 11/07/2015 313  140 - 400 K/uL Final  . MPV 11/07/2015 11.5  7.5 - 12.5 fL Final  . Neutro Abs 11/07/2015 3016  1,500 - 7,800 cells/uL Final  . Lymphs Abs 11/07/2015 1798  850 - 3,900 cells/uL Final  . Monocytes Absolute 11/07/2015 522  200 - 950 cells/uL Final  . Eosinophils Absolute 11/07/2015 406  15 - 500 cells/uL Final  . Basophils Absolute 11/07/2015 58  0 - 200 cells/uL Final  . Neutrophils Relative % 11/07/2015 52  % Final  . Lymphocytes Relative 11/07/2015 31  % Final  . Monocytes Relative 11/07/2015 9  % Final  . Eosinophils Relative 11/07/2015 7  % Final  . Basophils Relative 11/07/2015 1  % Final  . Smear Review 11/07/2015 Criteria for review not met   Final  . TSH 11/07/2015 0.18* mIU/L Final   Comment:   Reference Range   > or = 20 Years  0.40-4.50   Pregnancy Range First trimester  0.26-2.66 Second trimester 0.55-2.73 Third trimester  0.43-2.91     . Free T4 11/07/2015 1.9* 0.8 - 1.8 ng/dL Final  Abstract on 10/22/2015  Component Date Value Ref Range Status  . Hemoglobin 10/19/2015 10.2* 12.0 - 16.0 g/dL Final  . HCT 10/19/2015 32* 36 - 46 % Final  . Platelets 10/19/2015 380  150 - 399 K/L Final  . WBC 10/19/2015 14.0  10^3/mL Final  . Glucose 10/19/2015 171  mg/dL Final  . BUN 10/19/2015 14  4 - 21 mg/dL Final  . Creatinine 10/19/2015 0.8  0.5 -  1.1 mg/dL Final  . Potassium 10/19/2015 4.1  3.4 - 5.3 mmol/L Final  . Sodium 10/19/2015 134* 137 - 147 mmol/L Final  . Alkaline Phosphatase 10/19/2015 77  25 - 125 U/L Final  . ALT 10/19/2015 8  7 - 35 U/L Final  . AST 10/19/2015 12* 13 - 35 U/L Final  . Bilirubin, Total 10/19/2015 0.2  mg/dL Final  Orders Only on 10/18/2015  Component Date Value Ref Range Status  . Colony Count 10/20/2015 NO GROWTH   Final  . Organism ID, Bacteria 10/20/2015 NO GROWTH   Final  Office Visit on 10/18/2015  Component Date Value Ref Range Status  . Color, UA 10/18/2015 light brown   Final  . Clarity, UA 10/18/2015 cloudy   Final  . Glucose, UA 10/18/2015 negative   Final  . Bilirubin, UA 10/18/2015 small   Final  . Ketones,  UA 10/18/2015 trace   Final  . Spec Grav, UA 10/18/2015 1.020   Final  . Blood, UA 10/18/2015 trace   Final  . pH, UA 10/18/2015 5.0   Final  . Protein, UA 10/18/2015 30   Final  . Urobilinogen, UA 10/18/2015 1.0   Final  . Nitrite, UA 10/18/2015 negative   Final  . Leukocytes, UA 10/18/2015 large (3+)* Negative Final  Appointment on 10/10/2015  Component Date Value Ref Range Status  . Toxigenic C Difficile by pcr 10/11/2015 Negative  Negative Final  Office Visit on 10/08/2015  Component Date Value Ref Range Status  . WBC 10/09/2015 15.3* 3.4 - 10.8 x10E3/uL Final  . RBC 10/09/2015 4.25  3.77 - 5.28 x10E6/uL Final  . Hemoglobin 10/09/2015 12.1  11.1 - 15.9 g/dL Final  . Hematocrit 10/09/2015 38.1  34.0 - 46.6 % Final  . MCV 10/09/2015 90  79 - 97 fL Final  . MCH 10/09/2015 28.5  26.6 - 33.0 pg Final  . MCHC 10/09/2015 31.8  31.5 - 35.7 g/dL Final  . RDW 10/09/2015 14.8  12.3 - 15.4 % Final  . Platelets 10/09/2015 633* 150 - 379 x10E3/uL Final  . Neutrophils 10/09/2015 78  % Final  . Lymphs 10/09/2015 11  % Final  . Monocytes 10/09/2015 8  % Final  . Eos 10/09/2015 2  % Final  . Basos 10/09/2015 0  % Final  . Neutrophils Absolute 10/09/2015 12.0* 1.4 - 7.0 x10E3/uL Final    . Lymphocytes Absolute 10/09/2015 1.7  0.7 - 3.1 x10E3/uL Final  . Monocytes Absolute 10/09/2015 1.2* 0.1 - 0.9 x10E3/uL Final  . EOS (ABSOLUTE) 10/09/2015 0.2  0.0 - 0.4 x10E3/uL Final  . Basophils Absolute 10/09/2015 0.0  0.0 - 0.2 x10E3/uL Final  . Immature Granulocytes 10/09/2015 1  % Final  . Immature Grans (Abs) 10/09/2015 0.1  0.0 - 0.1 x10E3/uL Final  . Glucose 10/09/2015 96  65 - 99 mg/dL Final  . BUN 10/09/2015 11  8 - 27 mg/dL Final  . Creatinine, Ser 10/09/2015 0.79  0.57 - 1.00 mg/dL Final  . GFR calc non Af Amer 10/09/2015 82  >59 mL/min/1.73 Final  . GFR calc Af Amer 10/09/2015 94  >59 mL/min/1.73 Final  . BUN/Creatinine Ratio 10/09/2015 14  12 - 28 Final  . Sodium 10/09/2015 136  134 - 144 mmol/L Final  . Potassium 10/09/2015 4.6  3.5 - 5.2 mmol/L Final  . Chloride 10/09/2015 97  96 - 106 mmol/L Final  . CO2 10/09/2015 24  18 - 29 mmol/L Final  . Calcium 10/09/2015 9.1  8.7 - 10.3 mg/dL Final  . TSH 10/09/2015 13.700* 0.450 - 4.500 uIU/mL Final  . Free T4 10/09/2015 1.57  0.82 - 1.77 ng/dL Final  Admission on 09/23/2015, Discharged on 10/01/2015  No results displayed because visit has over 200 results.    Appointment on 08/15/2015  Component Date Value Ref Range Status  . TSH 08/16/2015 23.300* 0.450 - 4.500 uIU/mL Final    No results found.   Assessment/Plan   ICD-9-CM ICD-10-CM   1. Hypothyroidism due to acquired atrophy of thyroid 244.8 E03.8 CMP with eGFR   246.8 E03.4   2. Chronic obstructive pulmonary disease, unspecified COPD type ([place]) 496 J44.9 Ambulatory referral to Pulmonology  3. Asthma, unspecified asthma severity, uncomplicated 203.55 H74.163 Ambulatory referral to Pulmonology  4. Essential hypertension 401.9 I10 CMP with eGFR  5. Type 2 diabetes mellitus without complication, without long-term current use of insulin (HCC) 250.00 E11.9 CMP  with eGFR     Hemoglobin A1c     Microalbumin/Creatinine Ratio, Urine  6. Hyperlipidemia 272.4 E78.5 Lipid  Panel  7. Grief reaction 309.0 F43.21   8. Chronic low back pain 724.2 M54.5 DG Lumbar Spine Complete   338.29 G89.29     Repeat TSH, free T4 in 6 weeks  Cont current meds as ordered  Repeat CMP today + lipid panel and A1c  Refer to pulm for asthma/COPD mx  L-spine xray to eval back pain  F/u in 3 mos for routine visit  Lashell Moffitt S. Perlie Gold  [facility] [address] [place], [place] [address] [phone] Cell (Monday-Friday 8 AM - 5 PM) [phone] After 5 PM and follow prompts

## 2015-11-14 NOTE — Patient Instructions (Signed)
LAB APPT in 6 weeks   Continue current medications as ordered  Will call with lab results  Will call with pulmonary appt  Will call with xray results  Follow up in 3 mos for routine visit

## 2015-11-14 NOTE — Telephone Encounter (Signed)
Patient called and requested [place] to send [facility] an order to discontinue and pick up Oxygen. Order to be faxed to Fax#: [phone] Patient stated that she forgot to mention this to you at her appointment. Please Advise.

## 2015-11-15 ENCOUNTER — Other Ambulatory Visit: Payer: Self-pay | Admitting: Internal Medicine

## 2015-11-15 ENCOUNTER — Ambulatory Visit: Payer: Medicare Other | Admitting: Nurse Practitioner

## 2015-11-15 DIAGNOSIS — Z1231 Encounter for screening mammogram for malignant neoplasm of breast: Secondary | ICD-10-CM

## 2015-11-15 LAB — MICROALBUMIN / CREATININE URINE RATIO
Creatinine, Urine: 140 mg/dL (ref 20–320)
MICROALB/CREAT RATIO: 93 ug/mg{creat} — AB (ref <30)
Microalb, Ur: 13 mg/dL

## 2015-11-16 ENCOUNTER — Other Ambulatory Visit: Payer: Self-pay

## 2015-11-16 ENCOUNTER — Telehealth: Payer: Self-pay

## 2015-11-16 NOTE — Telephone Encounter (Signed)
Orders to DC Oxygen sent to [facility] Fax number [phone]

## 2015-11-16 NOTE — Telephone Encounter (Signed)
Printed and faxed message to [facility].

## 2015-11-21 ENCOUNTER — Other Ambulatory Visit: Payer: Self-pay

## 2015-11-21 ENCOUNTER — Ambulatory Visit
Admission: RE | Admit: 2015-11-21 | Discharge: 2015-11-21 | Disposition: A | Discharge status: Home / Self Care | Payer: Medicare Other | Source: Ambulatory Visit | Attending: Internal Medicine | Admitting: Internal Medicine

## 2015-11-21 DIAGNOSIS — Z1231 Encounter for screening mammogram for malignant neoplasm of breast: Secondary | ICD-10-CM | POA: Diagnosis not present

## 2015-12-01 DIAGNOSIS — K5900 Constipation, unspecified: Secondary | ICD-10-CM | POA: Diagnosis not present

## 2015-12-01 DIAGNOSIS — R197 Diarrhea, unspecified: Secondary | ICD-10-CM | POA: Diagnosis not present

## 2015-12-01 DIAGNOSIS — R143 Flatulence: Secondary | ICD-10-CM | POA: Diagnosis not present

## 2015-12-01 DIAGNOSIS — R10819 Abdominal tenderness, unspecified site: Secondary | ICD-10-CM | POA: Diagnosis not present

## 2015-12-06 ENCOUNTER — Other Ambulatory Visit: Payer: Self-pay

## 2015-12-06 DIAGNOSIS — E038 Other specified hypothyroidism: Secondary | ICD-10-CM

## 2015-12-11 ENCOUNTER — Institutional Professional Consult (permissible substitution): Payer: Medicare Other | Admitting: Internal Medicine

## 2015-12-16 ENCOUNTER — Other Ambulatory Visit: Payer: Self-pay | Admitting: Internal Medicine

## 2015-12-16 DIAGNOSIS — J45909 Unspecified asthma, uncomplicated: Secondary | ICD-10-CM

## 2015-12-18 NOTE — Telephone Encounter (Signed)
Ok to fill these inhalers? Not on current medication list, please advise

## 2015-12-18 NOTE — Telephone Encounter (Signed)
Okay to RF flovent HFA and Anoro

## 2015-12-18 NOTE — Telephone Encounter (Signed)
rx sent to pharmacy by e-script  

## 2015-12-27 ENCOUNTER — Other Ambulatory Visit: Payer: Medicare Other

## 2015-12-27 DIAGNOSIS — E038 Other specified hypothyroidism: Secondary | ICD-10-CM

## 2015-12-27 LAB — T4, FREE: FREE T4: 2.1 ng/dL — AB (ref 0.8–1.8)

## 2015-12-27 LAB — TSH: TSH: 0.04 m[IU]/L — AB

## 2015-12-31 ENCOUNTER — Other Ambulatory Visit: Payer: Self-pay

## 2015-12-31 MED ORDER — LEVOTHYROXINE SODIUM 150 MCG PO TABS: 150.0000 ug | ORAL_TABLET | Freq: Every day | ORAL | 3 refills | Status: DC

## 2016-01-14 ENCOUNTER — Other Ambulatory Visit: Payer: Self-pay | Admitting: *Deleted

## 2016-01-14 MED ORDER — LORAZEPAM 0.5 MG PO TABS: ORAL_TABLET | ORAL | 3 refills | Status: DC

## 2016-01-14 NOTE — Telephone Encounter (Signed)
[facility] 

## 2016-01-25 ENCOUNTER — Other Ambulatory Visit: Payer: Self-pay | Admitting: Internal Medicine

## 2016-01-25 ENCOUNTER — Ambulatory Visit (INDEPENDENT_AMBULATORY_CARE_PROVIDER_SITE_OTHER): Payer: Medicare Other | Admitting: Internal Medicine

## 2016-01-25 ENCOUNTER — Encounter: Payer: Self-pay | Admitting: Internal Medicine

## 2016-01-25 VITALS — BP 130/80 | HR 68 | Wt 189.0 lb

## 2016-01-25 DIAGNOSIS — G5690 Unspecified mononeuropathy of unspecified upper limb: Secondary | ICD-10-CM | POA: Diagnosis not present

## 2016-01-25 DIAGNOSIS — E119 Type 2 diabetes mellitus without complications: Secondary | ICD-10-CM

## 2016-01-25 DIAGNOSIS — J45909 Unspecified asthma, uncomplicated: Secondary | ICD-10-CM

## 2016-01-25 DIAGNOSIS — Z23 Encounter for immunization: Secondary | ICD-10-CM

## 2016-01-25 DIAGNOSIS — E039 Hypothyroidism, unspecified: Secondary | ICD-10-CM

## 2016-01-25 LAB — COMPLETE METABOLIC PANEL WITH GFR
ALT: 57 U/L — ABNORMAL HIGH (ref 6–29)
AST: 46 U/L — ABNORMAL HIGH (ref 10–35)
Albumin: 3.6 g/dL (ref 3.6–5.1)
Alkaline Phosphatase: 87 U/L (ref 33–130)
BUN: 17 mg/dL (ref 7–25)
CO2: 25 mmol/L (ref 20–31)
Calcium: 8.9 mg/dL (ref 8.6–10.4)
Chloride: 108 mmol/L (ref 98–110)
Creat: 0.79 mg/dL (ref 0.50–0.99)
GFR, Est African American: >89 mL/min (ref >=60)
GFR, Est Non African American: 82 mL/min (ref >=60)
Glucose, Bld: 70 mg/dL (ref 65–99)
Potassium: 4.4 mmol/L (ref 3.5–5.3)
Sodium: 144 mmol/L (ref 135–146)
Total Bilirubin: 0.2 mg/dL (ref 0.2–1.2)
Total Protein: 6.3 g/dL (ref 6.1–8.1)

## 2016-01-25 LAB — T4, FREE: Free T4: 1 ng/dL (ref 0.8–1.8)

## 2016-01-25 LAB — VITAMIN B12: Vitamin B-12: 527 pg/mL (ref 200–1100)

## 2016-01-25 LAB — TSH: TSH: 0.14 mIU/L — ABNORMAL LOW

## 2016-01-25 MED ORDER — GABAPENTIN 100 MG PO CAPS: 100.0000 mg | ORAL_CAPSULE | Freq: Every day | ORAL | 3 refills | Status: DC

## 2016-01-25 NOTE — Addendum Note (Signed)
Addended by: Gayland Curry on: 01/25/2016 09:46 AM   Modules accepted: Orders

## 2016-01-25 NOTE — Progress Notes (Addendum)
Location:  [facility] clinic Provider: Kellyn Mansfield L. Mariea Clonts, D.O., C.M.D. PCP:  Dr. Gildardo Cranker  Code Status: full code Goals of Care:  Advanced Directives 01/25/2016  Does patient have an advance directive? No  Does patient want to make changes to advanced directive? -  Would patient like information on creating an advanced directive? -  Pre-existing out of facility DNR order (yellow form or pink MOST form) -     Chief Complaint  Patient presents with  . Acute Visit    hands numb x1 week    HPI: Patient is a 60 y.o. female seen today for an acute visit for hand numbness for maybe two weeks.  She spends a lot of time on the computer.  Uses mouse.  Shakes her hands and it would get better for a while.  Has gotten worse to where she was waking up at night with numb hands.  Glucose had been wonderful lately.  Was high this am for her at 144.  Talked to someone who said that he had diabetic neuropathy.  That person was taking gabapentin.  Had oxycodone that she got in July after appendectomy--took some of that.  Got 20 then and has 8 left.  Might take it also for her back pain sometimes.  70s to 110s normally.    Reports she also had a TIA on the right that caused upper extremity numbness on the right with it.  Left side is not as bad.  Upper part of arms can also be affected.  No numbness of her feet.    Has quit anoro b/c it helped breathing, but caused chest pains.  Also has periods of nausea and diarrhea.    Had an episode of sweats and extreme heat requiring a fan while on the computer.  Past Medical History:  Diagnosis Date  . Abnormal vaginal bleeding   . Asthma   . Barrett's esophagus    with high grade dysplasia per endoscopy 05/2005 // followed by Dr. [name] ([facility])  . Chronic back pain   . COPD (chronic obstructive pulmonary disease) ([place])   . Diabetes mellitus without complication ([place])   . Duodenal ulcer disease    thought to be contributed at least partly by  overuse of NSAIDs and salicylates (goody powdr)  . Endometrial polyp 03/2003   s/p resection in 03/2003. Path showing submucosal leiomyoma and benign  proliferative type endometrium  . Fibroid uterus    s/p myomectomy x 2  . GERD (gastroesophageal reflux disease)   . H/O failed moderate sedation   . Hiatal hernia   . History of CVA (cerebrovascular accident) 10/2000  . 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 Hyperlipidemia   . Hypertension   . Hypothyroidism    with hx of multinodular goiter (noted on neck US in 11/2002 - showing diffuse nodularity and inhomogenous texture diffusely BL)  . Internal hemorrhoids    grade 2 per colonoscopy in 09/2006 - repeat colonoscopy rec in 5-10 years.  . Migraine   . Nevus   . Personal history of failed conscious sedation 08/11/2011  . Tachycardia   . Thyroid disease   . Tobacco abuse Jan 2014   quit    Past Surgical History:  Procedure Laterality Date  . CHOLECYSTECTOMY    . COLONOSCOPY  02/07/2010  . HYSTEROSCOPY    . LAPAROSCOPIC APPENDECTOMY  N/A 09/23/2015   Procedure: APPENDECTOMY LAPAROSCOPIC;  Surgeon: Georganna Skeans, MD;  Location: [place];  Service: General;  Laterality: N/A;  . [place]   x 2 - In 1997, noted to have extensive pelvic adhesions and BL tubal obstruction  . UPPER GASTROINTESTINAL ENDOSCOPY  12/18/2009    Allergies  Allergen Reactions  . Codeine     REACTION: headaches  . Prozac [Fluoxetine Hcl] Other (See Comments)    Suicidal ideations      Medication List       Accurate as of 01/25/16  9:01 AM. Always use your most recent med list.          albuterol 108 (90 Base) MCG/ACT inhaler Commonly known as:  PROAIR HFA Inhale 2 puffs into the lungs every 6 (six) hours as needed for wheezing or shortness of breath.   ANORO ELLIPTA 62.5-25 MCG/INH Aepb Generic drug:  umeclidinium-vilanterol INHALE ONE PUFF BY  MOUTH ONCE DAILY   Aspirin-Acetaminophen-Caffeine 500-325-65 MG Pack Take 1 Package by mouth as needed.   atorvastatin 80 MG tablet Commonly known as:  LIPITOR Take one tablet by mouth once daily at bedtime for cholesterol   diphenhydrAMINE 25 MG tablet Commonly known as:  BENADRYL Take 25 mg by mouth daily as needed for sleep. Reported on 10/08/2015   FLOVENT HFA 44 MCG/ACT inhaler Generic drug:  fluticasone INHALE TWO PUFFS INTO LUNGS TWICE DAILY   glipiZIDE 5 MG 24 hr tablet Commonly known as:  GLUCOTROL XL Take one tablet by mouth once daily to control blood sugar   glucose blood test strip Commonly known as:  ONETOUCH VERIO Use to self monitor blood glucose twice daily and as directed (E11.9)   ibuprofen 200 MG tablet Commonly known as:  ADVIL,MOTRIN Take 600 mg by mouth as needed for moderate pain.   levothyroxine 150 MCG tablet Commonly known as:  SYNTHROID, LEVOTHROID Take 1 tablet (150 mcg total) by mouth daily before breakfast.   lisinopril 10 MG tablet Commonly known as:  PRINIVIL,ZESTRIL Take 1/2 tablet by mouth once daily for blood pressure   LORazepam 0.5 MG tablet Commonly known as:  ATIVAN Take one tablet by mouth three times daily as needed for anxiety or nervousness   metoprolol tartrate 25 MG tablet Commonly known as:  LOPRESSOR Take 0.5 tablets (12.5 mg total) by mouth 2 (two) times daily.   omeprazole 20 MG capsule Commonly known as:  PRILOSEC Take 1 capsule (20 mg total) by mouth daily.   ONETOUCH DELICA LANCETS 99991111 Misc 1 each by Does not apply route 2 (two) times daily.   oxyCODONE-acetaminophen 5-325 MG tablet Commonly known as:  PERCOCET/ROXICET As needed, rx'ed after surgery   REFRESH EYE ITCH RELIEF OP Place 1 drop into both eyes daily as needed. For itchy eyes   traZODone 100 MG tablet Commonly known as:  DESYREL Take 1 tablet (100 mg total) by mouth at bedtime.       Review of Systems:  Review of Systems  Constitutional:  Negative for chills, fever and malaise/fatigue.  HENT: Negative for congestion.   Eyes:       Glasses  Respiratory: Positive for shortness of breath and wheezing.   Cardiovascular: Positive for chest pain. Negative for palpitations and leg swelling.  Gastrointestinal: Positive for abdominal pain, diarrhea and nausea. Negative for blood in stool, constipation, heartburn, melena and vomiting.  Genitourinary: Negative for dysuria.  Musculoskeletal: Negative for falls.  Skin: Negative for itching and rash.  Neurological: Positive for tingling  and sensory change. Negative for dizziness, speech change, focal weakness, loss of consciousness and weakness.  Endo/Heme/Allergies: Does not bruise/bleed easily.    Health Maintenance  Topic Date Due  . ZOSTAVAX  05/11/2015  . INFLUENZA VACCINE  11/13/2015  . Hepatitis C Screening  11/11/2016 (Originally 07-05-1955)  . HIV Screening  11/11/2016 (Originally 05/10/1970)  . OPHTHALMOLOGY EXAM  03/19/2016  . HEMOGLOBIN A1C  05/16/2016  . COLONOSCOPY  10/07/2016  . FOOT EXAM  11/13/2016  . PNEUMOCOCCAL POLYSACCHARIDE VACCINE (2) 05/20/2017  . PAP SMEAR  07/30/2017  . MAMMOGRAM  11/20/2017  . TETANUS/TDAP  06/11/2021    Physical Exam: Vitals:   01/25/16 0851  BP: 130/80  Pulse: 68  SpO2: 96%  Weight: 189 lb (85.7 kg)   Body mass index is 33.48 kg/m. Physical Exam  Constitutional: She is oriented to person, place, and time. She appears well-developed and well-nourished. No distress.  Neurological: She is alert and oriented to person, place, and time.  Skin: Skin is warm and dry. There is pallor.  Psychiatric: She has a normal mood and affect.    Labs reviewed: Basic Metabolic Panel:  Recent Labs  09/26/15 0529 09/27/15 0602 09/28/15 0630  10/01/15 0831 10/08/15 1407 10/19/15 11/07/15 0929 11/14/15 1039 12/27/15 0917  NA 139 136 140  --  132* 136 134*  --  143  --   K 3.1* 3.2* 4.0  --  4.8 4.6 4.1  --  4.7  --   CL 98* 93* 98*   --  95* 97  --   --  107  --   CO2 34* 33* 34*  --  29 24  --   --  25  --   GLUCOSE 164* 155* 117*  --  115* 96  --   --  53*  --   BUN 17 14 13   --  8 11 14   --  15  --   CREATININE 0.94 0.66 0.85  < > 0.68 0.79 0.8  --  0.84  --   CALCIUM 9.0 8.5* 8.9  --  8.8* 9.1  --   --  9.3  --   MG 1.9 1.9 1.7  --   --   --   --   --   --   --   PHOS 2.1* 2.3* 2.6  --   --   --   --   --   --   --   TSH  --   --   --   --   --  13.700*  --  0.18*  --  0.04*  < > = values in this interval not displayed. Liver Function Tests:  Recent Labs  07/02/15 0924 09/23/15 1157 10/19/15 11/14/15 1039  AST 23 16 12* 14  ALT 12 12* 8 9  ALKPHOS 85 78 77 76  BILITOT <0.2 0.7  --  0.3  PROT 6.8 6.1*  --  6.8  ALBUMIN 4.2 3.0*  --  3.5*   No results for input(s): LIPASE, AMYLASE in the last 8760 hours. No results for input(s): AMMONIA in the last 8760 hours. CBC:  Recent Labs  09/23/15 1157  10/01/15 0831 10/08/15 1407 10/19/15 11/07/15 0929  WBC 19.3*  < > 18.8* 15.3* 14.0 5.8  NEUTROABS 16.0*  --   --  12.0*  --  3,016  HGB 13.5  < > 12.2  --  10.2* 12.1  HCT 42.9  < > 40.8 38.1 32* 38.1  MCV  90.1  < > 93.2 90  --  87.8  PLT 211  < > 300 633* 380 313  < > = values in this interval not displayed. Lipid Panel:  Recent Labs  07/02/15 0924 11/14/15 1039  CHOL 142 132  HDL 58 45*  LDLCALC 64 65  TRIG 99 112  CHOLHDL 2.4 2.9   Lab Results  Component Value Date   HGBA1C 6.6 (H) 11/14/2015    Assessment/Plan 1. Neuropathy of hand, unspecified laterality - bilateral hands - discussed potential causes including carpal tunnel (but all digits involved; did have positive tinel's and phalen's signs) given her extensive computer use, also could be cervical involvement as bilateral (does report aches and pains in neck), could be due to her diabetes but well controlled recently, could be a vitamin deficiency or her thyroid disease -start with basic labs (due for recheck of thyroid anyway after  dose change): - T4, Free - TSH - Vitamin B12 - COMPLETE METABOLIC PANEL WITH GFR  2. Hypothyroidism, unspecified type - TSH recently low and dose reduced - T4, Free - TSH  3. Type 2 diabetes mellitus without complication, without long-term current use of insulin ([place]) -well controlled, too early for repeat hba1c (due next mo)  4. Need for prophylactic vaccination and inoculation against influenza -flu shot given  Labs/tests ordered:  Orders Placed This Encounter  Procedures  . T4, Free  . TSH  . Vitamin B12  . COMPLETE METABOLIC PANEL WITH GFR    [facility]   Note that pt was walking out the door and did request a medication for her symptoms also so given low dose gabapentin 100mg  po qhs to try in hopes it will also help her to sleep at night  Next appt:  02/15/2016 with Dr. Eulas Post as scheduled.  Rockwell Zentz L. Alontae Chaloux, D.O. [facility] [address], [place] [address] Cell Phone (Mon-Fri 8am-5pm):  [phone] On Call:  [phone] & follow prompts after 5pm & weekends Office Phone:  [phone] Office Fax:  [phone]

## 2016-01-28 ENCOUNTER — Other Ambulatory Visit: Payer: Self-pay

## 2016-01-28 ENCOUNTER — Encounter: Payer: Self-pay | Admitting: *Deleted

## 2016-01-28 NOTE — Addendum Note (Signed)
Addended by: Despina Hidden on: 01/28/2016 01:06 PM   Modules accepted: Orders

## 2016-02-06 ENCOUNTER — Telehealth: Payer: Self-pay | Admitting: Internal Medicine

## 2016-02-06 NOTE — Telephone Encounter (Signed)
left msg asking if pt can schedule AWV on same day as OV 11/3. VDM (Dee-Dee)

## 2016-02-15 ENCOUNTER — Ambulatory Visit (INDEPENDENT_AMBULATORY_CARE_PROVIDER_SITE_OTHER): Payer: Medicare Other | Admitting: Internal Medicine

## 2016-02-15 ENCOUNTER — Encounter: Payer: Self-pay | Admitting: Internal Medicine

## 2016-02-15 ENCOUNTER — Ambulatory Visit (INDEPENDENT_AMBULATORY_CARE_PROVIDER_SITE_OTHER): Payer: Medicare Other

## 2016-02-15 VITALS — BP 140/88 | HR 82 | Temp 98.3°F | Ht 63.0 in | Wt 190.0 lb

## 2016-02-15 DIAGNOSIS — F4320 Adjustment disorder, unspecified: Secondary | ICD-10-CM | POA: Diagnosis not present

## 2016-02-15 DIAGNOSIS — F4321 Adjustment disorder with depressed mood: Secondary | ICD-10-CM

## 2016-02-15 DIAGNOSIS — Z0000 Encounter for general adult medical examination without abnormal findings: Secondary | ICD-10-CM

## 2016-02-15 DIAGNOSIS — E785 Hyperlipidemia, unspecified: Secondary | ICD-10-CM | POA: Diagnosis not present

## 2016-02-15 DIAGNOSIS — E119 Type 2 diabetes mellitus without complications: Secondary | ICD-10-CM

## 2016-02-15 DIAGNOSIS — J449 Chronic obstructive pulmonary disease, unspecified: Secondary | ICD-10-CM | POA: Diagnosis not present

## 2016-02-15 DIAGNOSIS — R748 Abnormal levels of other serum enzymes: Secondary | ICD-10-CM

## 2016-02-15 DIAGNOSIS — E034 Atrophy of thyroid (acquired): Secondary | ICD-10-CM

## 2016-02-15 DIAGNOSIS — I10 Essential (primary) hypertension: Secondary | ICD-10-CM

## 2016-02-15 DIAGNOSIS — G5690 Unspecified mononeuropathy of unspecified upper limb: Secondary | ICD-10-CM | POA: Diagnosis not present

## 2016-02-15 LAB — LIPID PANEL
CHOL/HDL RATIO: 2.2 ratio (ref <=5.0)
Cholesterol: 131 mg/dL (ref 125–200)
HDL: 59 mg/dL (ref >=46)
LDL CALC: 52 mg/dL (ref <130)
TRIGLYCERIDES: 101 mg/dL (ref <150)
VLDL: 20 mg/dL (ref <30)

## 2016-02-15 LAB — COMPLETE METABOLIC PANEL WITH GFR
ALK PHOS: 80 U/L (ref 33–130)
ALT: 12 U/L (ref 6–29)
AST: 15 U/L (ref 10–35)
Albumin: 4.1 g/dL (ref 3.6–5.1)
BUN: 17 mg/dL (ref 7–25)
CO2: 26 mmol/L (ref 20–31)
Calcium: 9.9 mg/dL (ref 8.6–10.4)
Chloride: 103 mmol/L (ref 98–110)
Creat: 0.76 mg/dL (ref 0.50–0.99)
GFR, EST NON AFRICAN AMERICAN: 86 mL/min (ref >=60)
GLUCOSE: 101 mg/dL — AB (ref 65–99)
POTASSIUM: 4.1 mmol/L (ref 3.5–5.3)
SODIUM: 140 mmol/L (ref 135–146)
Total Bilirubin: 0.2 mg/dL (ref 0.2–1.2)
Total Protein: 7.2 g/dL (ref 6.1–8.1)

## 2016-02-15 LAB — HEMOGLOBIN A1C
HEMOGLOBIN A1C: 5.9 % — AB (ref <5.7)
Mean Plasma Glucose: 123 mg/dL

## 2016-02-15 MED ORDER — GABAPENTIN 300 MG PO CAPS: 300.0000 mg | ORAL_CAPSULE | Freq: Every day | ORAL | 6 refills | Status: DC

## 2016-02-15 NOTE — Progress Notes (Signed)
Subjective:   Grace [name] is a 60 y.o. female who presents for an Initial Medicare Annual Wellness Visit.  Review of Systems     Cardiac Risk Factors include: advanced age (>60men, >60 women);smoking/ tobacco exposure;obesity (BMI >30kg/m2);sedentary lifestyle;hypertension;diabetes mellitus     Objective:    Today's Vitals   02/15/16 0841 02/15/16 0844  BP: 140/88   Pulse: 82   Temp: 98.3 F (36.8 C)   TempSrc: Oral   SpO2: 96%   Weight: 190 lb (86.2 kg)   Height: 5\' 3"  (1.6 m)   PainSc: 2  2   PainLoc: Chest    Body mass index is 33.66 kg/m.   Current Medications (verified) Outpatient Encounter Prescriptions as of 02/15/2016  Medication Sig  . ANORO ELLIPTA 62.5-25 MCG/INH AEPB INHALE ONE PUFF BY MOUTH ONCE DAILY  . Aspirin-Acetaminophen-Caffeine 500-325-65 MG PACK Take 1 Package by mouth as needed.   Marland Kitchen atorvastatin (LIPITOR) 80 MG tablet Take one tablet by mouth once daily at bedtime for cholesterol  . FLOVENT HFA 44 MCG/ACT inhaler INHALE TWO PUFFS INTO LUNGS TWICE DAILY  . gabapentin (NEURONTIN) 100 MG capsule Take 1 capsule (100 mg total) by mouth at bedtime.  Marland Kitchen glipiZIDE (GLUCOTROL XL) 5 MG 24 hr tablet Take one tablet by mouth once daily to control blood sugar  . glucose blood (ONETOUCH VERIO) test strip Use to self monitor blood glucose twice daily and as directed (E11.9)  . ibuprofen (ADVIL,MOTRIN) 200 MG tablet Take 600 mg by mouth as needed for moderate pain.   Marland Kitchen Ketotifen Fumarate (REFRESH EYE ITCH RELIEF OP) Place 1 drop into both eyes daily as needed. For itchy eyes  . levothyroxine (SYNTHROID, LEVOTHROID) 150 MCG tablet Take 1 tablet (150 mcg total) by mouth daily before breakfast.  . lisinopril (PRINIVIL,ZESTRIL) 10 MG tablet Take 1/2 tablet by mouth once daily for blood pressure  . LORazepam (ATIVAN) 0.5 MG tablet Take one tablet by mouth three times daily as needed for anxiety or nervousness (Patient taking differently: Take 0.5 mg by mouth 1 day or 1  dose. Take one tablet by mouth three times daily as needed for anxiety or nervousness)  . metoprolol tartrate (LOPRESSOR) 25 MG tablet Take 0.5 tablets (12.5 mg total) by mouth 2 (two) times daily.  Marland Kitchen omeprazole (PRILOSEC) 20 MG capsule Take 1 capsule (20 mg total) by mouth daily.  Glory Rosebush DELICA LANCETS 99991111 MISC 1 each by Does not apply route 2 (two) times daily.  Marland Kitchen oxyCODONE-acetaminophen (PERCOCET/ROXICET) 5-325 MG tablet As needed, rx'ed after surgery  . PROAIR HFA 108 (90 Base) MCG/ACT inhaler INHALE TWO PUFFS INTO LUNGS EVERY 6 HOURS AS NEEDED FOR WHEEZING OR FOR SHORTNESS OF BREATH  . traZODone (DESYREL) 100 MG tablet Take 1 tablet (100 mg total) by mouth at bedtime.  . [DISCONTINUED] diphenhydrAMINE (BENADRYL) 25 MG tablet Take 25 mg by mouth daily as needed for sleep. Reported on 10/08/2015   No facility-administered encounter medications on file as of 02/15/2016.     Allergies (verified) Codeine and Prozac [fluoxetine hcl]   History: Past Medical History:  Diagnosis Date  . Abnormal vaginal bleeding   . Asthma   . Barrett's esophagus    with high grade dysplasia per endoscopy 05/2005 // followed by Dr. [name] ([facility])  . Chronic back pain   . COPD (chronic obstructive pulmonary disease) ([place])   . Diabetes mellitus without complication ([place])   . Duodenal ulcer disease    thought to be contributed at  least partly by overuse of NSAIDs and salicylates (goody powdr)  . Endometrial polyp 03/2003   s/p resection in 03/2003. Path showing submucosal leiomyoma and benign  proliferative type endometrium  . Fibroid uterus    s/p myomectomy x 2  . GERD (gastroesophageal reflux disease)   . H/O failed moderate sedation   . Hiatal hernia   . History of CVA (cerebrovascular accident) 10/2000  . 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 Hyperlipidemia   . Hypertension   . Hypothyroidism     with hx of multinodular goiter (noted on neck US in 11/2002 - showing diffuse nodularity and inhomogenous texture diffusely BL)  . Internal hemorrhoids    grade 2 per colonoscopy in 09/2006 - repeat colonoscopy rec in 5-10 years.  . Migraine   . Nevus   . Personal history of failed conscious sedation 08/11/2011  . Tachycardia   . Thyroid disease   . Tobacco abuse Jan 2014   quit   Past Surgical History:  Procedure Laterality Date  . CHOLECYSTECTOMY    . COLONOSCOPY  02/07/2010  . HYSTEROSCOPY    . LAPAROSCOPIC APPENDECTOMY N/A 09/23/2015   Procedure: APPENDECTOMY LAPAROSCOPIC;  Surgeon: Georganna Skeans, MD;  Location: [place];  Service: General;  Laterality: N/A;  . [place]   x 2 - In 1997, noted to have extensive pelvic adhesions and BL tubal obstruction  . UPPER GASTROINTESTINAL ENDOSCOPY  12/18/2009   Family History  Problem Relation Age of Onset  . COPD Mother   . Heart disease Father     had 5 heart attacks, first at age 3s.  Marland Kitchen COPD Father    Social History   Occupational History  . Not on file.   Social History Main Topics  . Smoking status: Former Smoker    Packs/day: 1.00    Years: 42.00    Types: Cigarettes    Quit date: 05/09/2012  . Smokeless tobacco: Never Used     Comment: Counseling sheet [id number]   . Alcohol use No  . Drug use: No  . Sexual activity: No    Tobacco Counseling Counseling given: No   Activities of Daily Living In your present state of health, do you have any difficulty performing the following activities: 02/15/2016 09/24/2015  Hearing? N N  Vision? N N  Difficulty concentrating or making decisions? Y N  Walking or climbing stairs? Y N  Dressing or bathing? N N  Doing errands, shopping? N N  Preparing Food and eating ? N -  Using the Toilet? N -  In the past six months, have you accidently leaked urine? Y -  Do you have problems with loss of bowel control? N -  Managing your Medications? N -  Managing your Finances? N -    Housekeeping or managing your Housekeeping? N -  Some recent data might be hidden    Immunizations and Health Maintenance Immunization History  Administered Date(s) Administered  . Influenza Split 04/25/2011  . Influenza Whole 02/06/2009  . Influenza, Seasonal, Injecte, Preservative Fre 05/20/2012  . Influenza,inj,Quad PF,36+ Mos 03/15/2013, 01/12/2015, 01/25/2016  . Influenza-Unspecified 12/13/2013  . Pneumococcal Polysaccharide-23 05/20/2012  . Tdap 06/12/2011  . Zoster 12/16/2015   There are no preventive care reminders to display for this patient.  Patient Care Team: Gildardo Cranker, DO as PCP - General (Internal Medicine)  Indicate any  recent Medical Services you may have received from other than [facility] providers in the past year (date may be approximate).     Assessment:   This is a routine wellness examination for Grace [name].   Hearing/Vision screen Hearing Screening Comments: States she has never had a hearing screen.  Vision Screening Comments: Last eye exam done Dec. '16.   Dietary issues and exercise activities discussed: Current Exercise Habits: Home exercise routine, Type of exercise: exercise ball;stretching, Time (Minutes): 30, Frequency (Times/Week): 2, Weekly Exercise (Minutes/Week): 60, Intensity: Mild  Goals    . Blood Pressure < 140/90    . Exercise 3x per week (30 min per time)          Starting 02/15/16, I will attempt to start exercising 3 times per week, at home.     Marland Kitchen LDL CALC < 100    . Quit smoking / using tobacco      Depression Screen PHQ 2/9 Scores 02/15/2016 09/05/2015 06/30/2014 11/22/2013 03/15/2013 06/17/2012 05/20/2012  PHQ - 2 Score 6 0 0 0 0 0 2  PHQ- 9 Score 22 - - - - 0 12  Pt became very tearful when asking the PHQ 9. States between her financial concerns and her chronic pain in back, chest and hands, things keep "piling up".   Fall Risk Fall Risk  02/15/2016 11/14/2015 10/18/2015 09/05/2015 07/04/2015  Falls in the past year? No Yes Yes No No   Number falls in past yr: - 1 1 - -  Injury with Fall? - No Yes - -    Cognitive Function: MMSE - Mini Mental State Exam 02/15/2016  Orientation to time 5  Orientation to Place 5  Registration 3  Attention/ Calculation 5  Recall 3  Language- name 2 objects 2  Language- repeat 1  Language- follow 3 step command 3  Language- read & follow direction 1  Write a sentence 1  Copy design 1  Total score 30        Screening Tests Health Maintenance  Topic Date Due  . Hepatitis C Screening  11/11/2016 (Originally [birth year])  . HIV Screening  11/11/2016 (Originally 05/10/1970)  . OPHTHALMOLOGY EXAM  03/19/2016  . HEMOGLOBIN A1C  05/16/2016  . COLONOSCOPY  10/07/2016  . FOOT EXAM  11/13/2016  . PNEUMOCOCCAL POLYSACCHARIDE VACCINE (2) 05/20/2017  . PAP SMEAR  07/30/2017  . MAMMOGRAM  11/20/2017  . TETANUS/TDAP  06/11/2021  . INFLUENZA VACCINE  Completed  . ZOSTAVAX  Completed      Plan:  I have personally reviewed and addressed the Medicare Annual Wellness questionnaire and have noted the following in the patient's chart:  A. Medical and social history B. Use of alcohol, tobacco or illicit drugs  C. Current medications and supplements D. Functional ability and status E.  Nutritional status F.  Physical activity G. Advance directives H. List of other physicians I.  Hospitalizations, surgeries, and ER visits in previous 12 months J.  [place] to include hearing, vision, cognitive, depression L. Referrals and appointments - none  In addition, I have reviewed and discussed with patient certain preventive protocols, quality metrics, and best practice recommendations. A written personalized care plan for preventive services as well as general preventive health recommendations were provided to patient.  See attached scanned questionnaire for additional information.   Signed,   Allyn Kenner, LPN Health Advisor   I have reviewed the health advisor's note and  was available for consultation. I agree with documentation and plan.   [employer]  Wells Guiles  [facility] [address] [place], [place] [address] [phone] Cell (Monday-Friday 8 AM - 5 PM) [phone] After 5 PM and follow prompts

## 2016-02-15 NOTE — Patient Instructions (Addendum)
Please call insurance company and ask them if a NERVE CONDUCTION STUDY/EMG is covered  Increase gabapentin to 300mg  at bedtime  Continue other medicattons as ordered. Take flovent every day  Will call with lab results  Follow up in 3 mos for routine visit.

## 2016-02-15 NOTE — Patient Instructions (Signed)
Ms. Redbird , Thank you for taking time to come for your Medicare Wellness Visit. I appreciate your ongoing commitment to your health goals. Please review the following plan we discussed and let me know if I can assist you in the future.   These are the goals we discussed: Goals    . Blood Pressure < 140/90    . LDL CALC < 100    . Quit smoking / using tobacco       This is a list of the screening recommended for you and due dates:  Health Maintenance  Topic Date Due  .  Hepatitis C: One time screening is recommended by Center for Disease Control  (CDC) for  adults born from [birth year] through 1965.   11/11/2016*  . HIV Screening  11/11/2016*  . Eye exam for diabetics  03/19/2016  . Hemoglobin A1C  05/16/2016  . Colon Cancer Screening  10/07/2016  . Complete foot exam   11/13/2016  . Pneumococcal vaccine (2) 05/20/2017  . Pap Smear  07/30/2017  . Mammogram  11/20/2017  . Tetanus Vaccine  06/11/2021  . Flu Shot  Completed  . Shingles Vaccine  Completed  *Topic was postponed. The date shown is not the original due date.  Preventive Care for Adults  A healthy lifestyle and preventive care can promote health and wellness. Preventive health guidelines for adults include the following key practices.  . A routine yearly physical is a good way to check with your health care provider about your health and preventive screening. It is a chance to share any concerns and updates on your health and to receive a thorough exam.  . Visit your dentist for a routine exam and preventive care every 6 months. Brush your teeth twice a day and floss once a day. Good oral hygiene prevents tooth decay and gum disease.  . The frequency of eye exams is based on your age, health, family medical history, use  of contact lenses, and other factors. Follow your health care provider's ecommendations for frequency of eye exams.  . Eat a healthy diet. Foods like vegetables, fruits, whole grains, low-fat dairy products,  and lean protein foods contain the nutrients you need without too many calories. Decrease your intake of foods high in solid fats, added sugars, and salt. Eat the right amount of calories for you. Get information about a proper diet from your health care provider, if necessary.  . Regular physical exercise is one of the most important things you can do for your health. Most adults should get at least 150 minutes of moderate-intensity exercise (any activity that increases your heart rate and causes you to sweat) each week. In addition, most adults need muscle-strengthening exercises on 2 or more days a week.  Silver Sneakers may be a benefit available to you. To determine eligibility, you may visit the website: www.silversneakers.com or contact program at [phone] Mon-Fri between 8AM-8PM.   . Maintain a healthy weight. The body mass index (BMI) is a screening tool to identify possible weight problems. It provides an estimate of body fat based on height and weight. Your health care provider can find your BMI and can help you achieve or maintain a healthy weight.   For adults 20 years and older: ? A BMI below 18.5 is considered underweight. ? A BMI of 18.5 to 24.9 is normal. ? A BMI of 25 to 29.9 is considered overweight. ? A BMI of 30 and above is considered obese.   . Maintain  normal blood lipids and cholesterol levels by exercising and minimizing your intake of saturated fat. Eat a balanced diet with plenty of fruit and vegetables. Blood tests for lipids and cholesterol should begin at age 84 and be repeated every 5 years. If your lipid or cholesterol levels are high, you are over 50, or you are at high risk for heart disease, you may need your cholesterol levels checked more frequently. Ongoing high lipid and cholesterol levels should be treated with medicines if diet and exercise are not working.  . If you smoke, find out from your health care provider how to quit. If you do not use tobacco,  please do not start.  . If you choose to drink alcohol, please do not consume more than 2 drinks per day. One drink is considered to be 12 ounces (355 mL) of beer, 5 ounces (148 mL) of wine, or 1.5 ounces (44 mL) of liquor.  . If you are 96-86 years old, ask your health care provider if you should take aspirin to prevent strokes.  . Use sunscreen. Apply sunscreen liberally and repeatedly throughout the day. You should seek shade when your shadow is shorter than you. Protect yourself by wearing long sleeves, pants, a wide-brimmed hat, and sunglasses year round, whenever you are outdoors.  . Once a month, do a whole body skin exam, using a mirror to look at the skin on your back. Tell your health care provider of new moles, moles that have irregular borders, moles that are larger than a pencil eraser, or moles that have changed in shape or color.

## 2016-02-15 NOTE — Progress Notes (Signed)
Patient ID: Grace [name], female   DOB: Feb 19, 1956, 60 y.o.   MRN: [id number]    Location:  [facility] Place of Service: OFFICE  Chief Complaint  Patient presents with  . Medical Management of Chronic Issues    3 month routine visit  . Depression    PQ9 score 22  . Other    MMSE 30/30 passed clock test    HPI:  60 yo female seen today for f/u. She was seen by Dr Mariea Clonts for hand numbness recently. She still c/o hand numbness. Labs ordered and stable. B12 level 527; ALT 57 and AST 46. No relief with gabapentin  grief reaction - over mother's death in 12/16/2014. (+) insomnia and wakes up every hr. No relief with lorazepam. Takes melatonin '10mg'$ . She takes trazodone for insomnia but wakes up every 2-3 hrs. She has suicidal thoughts. She sees counselor through hospice.  COPD/asthma hx - no recent exacerbations. She takes flovent, proair. She completed a COPD study at [facility] in April 2017. Anora caused chest pain.  Hypothyroid - TSH 0.14; Free T4 1. Levothyroxine decreased to 150 mcg daily. (+) hot intolerance. She reports improved nails but still has issues with thinning hair.  Tobacco abuse - she stopped smoking but is now eating more. Weight 196-->173-->175-->190 lbs since June 2017  HTN - BP fluctuating at home 120-130s/70-80s usually and occasional SBP 160s. Her lisinopril was stopped last month due to low BPs  DM - A1c 6.6%. She takes glipizide. No low BS reactions. Urine microalbumin/Cr ratio 93 (down from 147.5)  Hyperlipidemia - stable on lipitor. LDL 65  Protein calorie malnutrition - improved albumin 3.6  Past Medical History:  Diagnosis Date  . Abnormal vaginal bleeding   . Asthma   . Barrett's esophagus    with high grade dysplasia per endoscopy 05/2005 // followed by Dr. [name] ([facility])  . Chronic back pain   . COPD (chronic obstructive pulmonary disease) ([place])   . Diabetes mellitus without complication ([place])   . Duodenal ulcer disease    thought to be  contributed at least partly by overuse of NSAIDs and salicylates (goody powdr)  . Endometrial polyp 03/2003   s/p resection in 03/2003. Path showing submucosal leiomyoma and benign  proliferative type endometrium  . Fibroid uterus    s/p myomectomy x 2  . GERD (gastroesophageal reflux disease)   . H/O failed moderate sedation   . Hiatal hernia   . History of CVA (cerebrovascular accident) 10/2000  . 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 Hyperlipidemia   . Hypertension   . Hypothyroidism    with hx of multinodular goiter (noted on neck US in 16-Dec-2002 - showing diffuse nodularity and inhomogenous texture diffusely BL)  . Internal hemorrhoids    grade 2 per colonoscopy in 09/2006 - repeat colonoscopy rec in 5-10 years.  . Migraine   . Nevus   . Personal history of failed conscious sedation 08/11/2011  . Tachycardia   . Thyroid disease   . Tobacco abuse Jan 2014   quit    Past Surgical History:  Procedure Laterality Date  . CHOLECYSTECTOMY    . COLONOSCOPY  02/07/2010  . HYSTEROSCOPY    . LAPAROSCOPIC APPENDECTOMY N/A 09/23/2015   Procedure: APPENDECTOMY LAPAROSCOPIC;  Surgeon: Georganna Skeans, MD;  Location: [place];  Service: General;  Laterality: N/A;  .  [place]   x 2 - In 1997, noted to have extensive pelvic adhesions and BL tubal obstruction  . UPPER GASTROINTESTINAL ENDOSCOPY  12/18/2009    Patient Care Team: Gildardo Cranker, DO as PCP - General (Internal Medicine)  Social History   Social History  . Marital status: Divorced    Spouse name: N/A  . Number of children: N/A  . Years of education: N/A   Occupational History  . Not on file.   Social History Main Topics  . Smoking status: Former Smoker    Packs/day: 1.00    Years: 42.00    Types: Cigarettes    Quit date: 05/09/2012  . Smokeless tobacco: Never Used     Comment: Counseling sheet [id number]   . Alcohol use  No  . Drug use: No  . Sexual activity: No   Other Topics Concern  . Not on file   Social History Narrative         Financial assistance approved for 100% discount at [facility] and has [facility] card per Bonna Gains   01/15/2010      Diet- N/A   Caffeine- Goody powders   Married-Divorced   [employer] and lives with mother   Pets-2   Current/past profession-[employer]   Exercise-No   Living will-No   DNR-No   POA/HPOA-No              reports that she quit smoking about 3 years ago. Her smoking use included Cigarettes. She has a 42.00 pack-year smoking history. She has never used smokeless tobacco. She reports that she does not drink alcohol or use drugs.  Family History  Problem Relation Age of Onset  . COPD Mother   . Heart disease Father     had 5 heart attacks, first at age 71s.  Marland Kitchen COPD Father    Family Status  Relation Status  . Mother Deceased  . Father Deceased at age 22     Allergies  Allergen Reactions  . Codeine     REACTION: headaches  . Prozac [Fluoxetine Hcl] Other (See Comments)    Suicidal ideations    Medications: Patient's Medications  New Prescriptions   No medications on file  Previous Medications   ANORO ELLIPTA 62.5-25 MCG/INH AEPB    INHALE ONE PUFF BY MOUTH ONCE DAILY   ASPIRIN-ACETAMINOPHEN-CAFFEINE 500-325-65 MG PACK    Take 1 Package by mouth as needed.    ATORVASTATIN (LIPITOR) 80 MG TABLET    Take one tablet by mouth once daily at bedtime for cholesterol   FLOVENT HFA 44 MCG/ACT INHALER    INHALE TWO PUFFS INTO LUNGS TWICE DAILY   GABAPENTIN (NEURONTIN) 100 MG CAPSULE    Take 1 capsule (100 mg total) by mouth at bedtime.   GLIPIZIDE (GLUCOTROL XL) 5 MG 24 HR TABLET    Take one tablet by mouth once daily to control blood sugar   GLUCOSE BLOOD (ONETOUCH VERIO) TEST STRIP    Use to self monitor blood glucose twice daily and as directed (E11.9)   IBUPROFEN (ADVIL,MOTRIN) 200 MG TABLET    Take 600 mg by mouth as needed for moderate pain.     KETOTIFEN FUMARATE (REFRESH EYE ITCH RELIEF OP)    Place 1 drop into both eyes daily as needed. For itchy eyes   LEVOTHYROXINE (SYNTHROID, LEVOTHROID) 150 MCG TABLET    Take 1 tablet (150 mcg total) by mouth daily before breakfast.   LISINOPRIL (PRINIVIL,ZESTRIL) 10 MG TABLET    Take  1/2 tablet by mouth once daily for blood pressure   LORAZEPAM (ATIVAN) 0.5 MG TABLET    Take one tablet by mouth three times daily as needed for anxiety or nervousness   METOPROLOL TARTRATE (LOPRESSOR) 25 MG TABLET    Take 0.5 tablets (12.5 mg total) by mouth 2 (two) times daily.   OMEPRAZOLE (PRILOSEC) 20 MG CAPSULE    Take 1 capsule (20 mg total) by mouth daily.   ONETOUCH DELICA LANCETS 16X MISC    1 each by Does not apply route 2 (two) times daily.   OXYCODONE-ACETAMINOPHEN (PERCOCET/ROXICET) 5-325 MG TABLET    As needed, rx'ed after surgery   PROAIR HFA 108 (90 BASE) MCG/ACT INHALER    INHALE TWO PUFFS INTO LUNGS EVERY 6 HOURS AS NEEDED FOR WHEEZING OR FOR SHORTNESS OF BREATH   TRAZODONE (DESYREL) 100 MG TABLET    Take 1 tablet (100 mg total) by mouth at bedtime.  Modified Medications   No medications on file  Discontinued Medications   No medications on file    Review of Systems  Respiratory: Positive for shortness of breath.   Neurological: Positive for numbness.  Psychiatric/Behavioral: Positive for dysphoric mood and sleep disturbance. The patient is nervous/anxious.   All other systems reviewed and are negative.   Vitals:   02/15/16 0924  BP: 140/88  Pulse: 82  Temp: 98.3 F (36.8 C)  TempSrc: Oral  SpO2: 96%  Weight: 190 lb (86.2 kg)  Height: _0  (1.6 m)   Body mass index is 33.66 kg/m.  Physical Exam  Constitutional: She appears well-developed and well-nourished.  HENT:  Mouth/Throat: Oropharynx is clear and moist. No oropharyngeal exudate.  Eyes: Pupils are equal, round, and reactive to light. No scleral icterus.  Neck: Neck supple. Carotid bruit is not present. No tracheal  deviation present. No thyromegaly present.  Cardiovascular: Normal rate, regular rhythm, normal heart sounds and intact distal pulses.  Exam reveals no gallop and no friction rub.   No murmur heard. Trace LE edema b/l. no calf TTP.   Pulmonary/Chest: Effort normal and breath sounds normal. No stridor. No respiratory distress. She has no wheezes. She has no rales.  Reduced BS at base b/l  Abdominal: Soft. Bowel sounds are normal. She exhibits no distension and no mass. There is no hepatomegaly. There is tenderness (epigastric). There is no rebound and no guarding.  Musculoskeletal: She exhibits edema and tenderness.       Back:  Neg Tinel's b/l; no UE epicondyle TTP or swelling  Lymphadenopathy:    She has no cervical adenopathy.  Neurological: She is alert.  Skin: Skin is warm and dry. No lesion and no rash noted.  Psychiatric: Her speech is normal and behavior is normal. Judgment and thought content normal. She exhibits a depressed mood.     Labs reviewed: Office Visit on 01/25/2016  Component Date Value Ref Range Status  . Free T4 01/25/2016 1.0  0.8 - 1.8 ng/dL Final  . TSH 01/25/2016 0.14* mIU/L Final   Comment:   Reference Range   > or = 20 Years  0.40-4.50   Pregnancy Range First trimester  0.26-2.66 Second trimester 0.55-2.73 Third trimester  0.43-2.91     . Vitamin B-12 01/25/2016 527  200 - 1,100 pg/mL Final  . Sodium 01/25/2016 144  135 - 146 mmol/L Final  . Potassium 01/25/2016 4.4  3.5 - 5.3 mmol/L Final  . Chloride 01/25/2016 108  98 - 110 mmol/L Final  . CO2 01/25/2016 25  20 - 31 mmol/L Final  . Glucose, Bld 01/25/2016 70  65 - 99 mg/dL Final  . BUN 01/25/2016 17  7 - 25 mg/dL Final  . Creat 01/25/2016 0.79  0.50 - 0.99 mg/dL Final   Comment:   For patients > or = 60 years of age: The upper reference limit for Creatinine is approximately 13% higher for people identified as African-American.     . Total Bilirubin 01/25/2016 0.2  0.2 - 1.2 mg/dL Final  .  Alkaline Phosphatase 01/25/2016 87  33 - 130 U/L Final  . AST 01/25/2016 46* 10 - 35 U/L Final  . ALT 01/25/2016 57* 6 - 29 U/L Final  . Total Protein 01/25/2016 6.3  6.1 - 8.1 g/dL Final  . Albumin 01/25/2016 3.6  3.6 - 5.1 g/dL Final  . Calcium 01/25/2016 8.9  8.6 - 10.4 mg/dL Final  . GFR, Est African American 01/25/2016 >89  >=60 mL/min Final  . GFR, Est Non African American 01/25/2016 82  >=60 mL/min Final  Appointment on 12/27/2015  Component Date Value Ref Range Status  . TSH 12/27/2015 0.04* mIU/L Final   Comment:   Reference Range   > or = 20 Years  0.40-4.50   Pregnancy Range First trimester  0.26-2.66 Second trimester 0.55-2.73 Third trimester  0.43-2.91     . Free T4 12/27/2015 2.1* 0.8 - 1.8 ng/dL Final    No results found.   Assessment/Plan   ICD-9-CM ICD-10-CM   1. Neuropathy of hand, unspecified laterality 354.9 G56.90 gabapentin (NEURONTIN) 300 MG capsule  2. Type 2 diabetes mellitus without complication, without long-term current use of insulin (HCC) 250.00 E11.9 CMP with eGFR     Lipid Panel     Hemoglobin A1c  3. Hypothyroidism due to acquired atrophy of thyroid 244.8 E03.4    246.8    4. Chronic obstructive pulmonary disease, unspecified COPD type ([place]) 496 J44.9   5. Essential hypertension 401.9 I10   6. Grief reaction 309.0 F43.20   7. Elevated liver enzymes 790.5 R74.8 CMP with eGFR  8. Hyperlipidemia LDL goal <100 272.4 E78.5 Lipid Panel    She will call insurance company and ask them if a NERVE CONDUCTION STUDY/EMG is covered  Increase gabapentin to 376m at bedtime  Continue other medicattons as ordered. Take flovent every day  Will call with lab results  Follow up in 3 mos for routine visit.  Grace [name] S. CPerlie [name] PWisconsin Specialty Surgery Center LLCand Adult Medicine [address] RoadGAnmoore Lar[place]ie [phone] (Monday-Friday 8 AM - 5 PM) ([phone] 5 PM and follow prompts

## 2016-02-25 ENCOUNTER — Telehealth: Payer: Self-pay | Admitting: Internal Medicine

## 2016-02-25 NOTE — Telephone Encounter (Signed)
left msg asking pt to call me at [phone] about community resource referrals. VDM (DD)

## 2016-03-04 NOTE — Telephone Encounter (Signed)
I left a message asking the pt to call me at [phone] about dental and vision resources.

## 2016-04-22 ENCOUNTER — Other Ambulatory Visit: Payer: Self-pay | Admitting: Internal Medicine

## 2016-04-24 ENCOUNTER — Telehealth: Payer: Self-pay

## 2016-04-24 DIAGNOSIS — G5690 Unspecified mononeuropathy of unspecified upper limb: Secondary | ICD-10-CM

## 2016-04-24 MED ORDER — GABAPENTIN 300 MG PO CAPS: 300.0000 mg | ORAL_CAPSULE | Freq: Two times a day (BID) | ORAL | 1 refills | Status: DC

## 2016-04-24 MED ORDER — INDACATEROL-GLYCOPYRROLATE 27.5-15.6 MCG IN CAPS: 1.0000 | ORAL_CAPSULE | Freq: Two times a day (BID) | RESPIRATORY_TRACT | 0 refills | Status: DC

## 2016-04-24 NOTE — Telephone Encounter (Signed)
1.) Spoke with patient, name of test is Nerve Conduction Study/EMG per Dr.Carter's last OV note.  2.) Patient also [place] and with her breathing, back pain and right hip pain she is unable to pull trash to the streets. Patient's neighbor was helping with this but is no longer available to help. Patient would like this letter mailed to her  3.) Patient with ongoing breathing concerns, no cigarette since June 11th. Patient is unable to see pulmonary due to financial strain. Patient is using current medications as prescribed. Patient mentioned if she uses Anoro more than 3 days in a row she will experience chest pain.    4.) Gabapentin is not helping with hand, back and hip concerns. Patient is taking Gabapentin 100 mg daily and 300 mg at bedtime. Please advise on alternative- send rx to [facility].

## 2016-04-24 NOTE — Telephone Encounter (Signed)
1.) Spoke with patient, patient aware the [facility] will contact her.  2.) Samples of Utibron placed at the front, patient aware to d/c Anoro. Medication list updated, Allergy/Intolerance list updated.   3.) RX for Gabapentin (increased dose) sent to [facility]

## 2016-04-24 NOTE — Telephone Encounter (Signed)
Patient was speaking with the Care Guide to schedule a wellness appointment and asked the following questions:   1.) Patient would like to know the name of test for nerves  2.) Requesting note for the city to pick-up trash  3.) Breathing concerns

## 2016-04-24 NOTE — Telephone Encounter (Signed)
1.) Noted  2.) Sanitation services contacted by [facility]. They will f/u with pt about handicap services for trash collection  3.) recommend d/c anora. Provide samples for utibron neohaler to crush 1 capsule in inhaler and inhale BID  4.) increase gabapentin 300mg  po BID #180 with 1 RF

## 2016-04-24 NOTE — Telephone Encounter (Signed)
Left message for patient to return call to office.  I called the [facility] collection and they will give her a call regarding trash pickup.  [phone]

## 2016-05-04 ENCOUNTER — Other Ambulatory Visit: Payer: Self-pay | Admitting: Internal Medicine

## 2016-05-04 DIAGNOSIS — E119 Type 2 diabetes mellitus without complications: Secondary | ICD-10-CM

## 2016-05-05 ENCOUNTER — Other Ambulatory Visit: Payer: Self-pay | Admitting: *Deleted

## 2016-05-05 DIAGNOSIS — G5690 Unspecified mononeuropathy of unspecified upper limb: Secondary | ICD-10-CM

## 2016-05-05 MED ORDER — LEVOTHYROXINE SODIUM 150 MCG PO TABS: ORAL_TABLET | ORAL | 1 refills | Status: DC

## 2016-05-05 MED ORDER — GABAPENTIN 300 MG PO CAPS: 300.0000 mg | ORAL_CAPSULE | Freq: Two times a day (BID) | ORAL | 1 refills | Status: DC

## 2016-05-05 MED ORDER — TRAZODONE HCL 100 MG PO TABS: 100.0000 mg | ORAL_TABLET | Freq: Every day | ORAL | 1 refills | Status: DC

## 2016-05-05 NOTE — Telephone Encounter (Signed)
[facility] 

## 2016-05-06 ENCOUNTER — Other Ambulatory Visit: Payer: Self-pay | Admitting: *Deleted

## 2016-05-06 MED ORDER — ALBUTEROL SULFATE HFA 108 (90 BASE) MCG/ACT IN AERS: INHALATION_SPRAY | RESPIRATORY_TRACT | 6 refills | Status: DC

## 2016-05-06 NOTE — Telephone Encounter (Signed)
[facility] 

## 2016-05-08 ENCOUNTER — Telehealth: Payer: Self-pay | Admitting: Internal Medicine

## 2016-05-09 DIAGNOSIS — Z0289 Encounter for other administrative examinations: Secondary | ICD-10-CM

## 2016-05-09 NOTE — Telephone Encounter (Signed)
I printed off last OV note and attached it to [place]

## 2016-05-14 NOTE — Telephone Encounter (Signed)
Form ready for pick up. Copy made and sent for scanning. LMOM that form is ready for pick up, placed up front.

## 2016-05-19 ENCOUNTER — Other Ambulatory Visit: Payer: Self-pay | Admitting: *Deleted

## 2016-05-19 MED ORDER — LEVOTHYROXINE SODIUM 150 MCG PO TABS: ORAL_TABLET | ORAL | 1 refills | Status: DC

## 2016-05-19 NOTE — Telephone Encounter (Signed)
[facility] 

## 2016-05-21 ENCOUNTER — Ambulatory Visit (INDEPENDENT_AMBULATORY_CARE_PROVIDER_SITE_OTHER): Payer: Medicare Other | Admitting: Internal Medicine

## 2016-05-21 ENCOUNTER — Encounter: Payer: Self-pay | Admitting: Internal Medicine

## 2016-05-21 VITALS — BP 110/70 | HR 95 | Temp 97.7°F | Ht 63.0 in | Wt 198.0 lb

## 2016-05-21 DIAGNOSIS — M545 Low back pain: Secondary | ICD-10-CM

## 2016-05-21 DIAGNOSIS — J449 Chronic obstructive pulmonary disease, unspecified: Secondary | ICD-10-CM | POA: Insufficient documentation

## 2016-05-21 DIAGNOSIS — E034 Atrophy of thyroid (acquired): Secondary | ICD-10-CM | POA: Diagnosis not present

## 2016-05-21 DIAGNOSIS — I10 Essential (primary) hypertension: Secondary | ICD-10-CM

## 2016-05-21 DIAGNOSIS — G5690 Unspecified mononeuropathy of unspecified upper limb: Secondary | ICD-10-CM

## 2016-05-21 DIAGNOSIS — E119 Type 2 diabetes mellitus without complications: Secondary | ICD-10-CM

## 2016-05-21 DIAGNOSIS — R1013 Epigastric pain: Secondary | ICD-10-CM

## 2016-05-21 DIAGNOSIS — K22719 Barrett's esophagus with dysplasia, unspecified: Secondary | ICD-10-CM | POA: Diagnosis not present

## 2016-05-21 DIAGNOSIS — F4320 Adjustment disorder, unspecified: Secondary | ICD-10-CM

## 2016-05-21 DIAGNOSIS — G8929 Other chronic pain: Secondary | ICD-10-CM | POA: Diagnosis not present

## 2016-05-21 DIAGNOSIS — E785 Hyperlipidemia, unspecified: Secondary | ICD-10-CM

## 2016-05-21 DIAGNOSIS — J441 Chronic obstructive pulmonary disease with (acute) exacerbation: Secondary | ICD-10-CM | POA: Insufficient documentation

## 2016-05-21 DIAGNOSIS — F4321 Adjustment disorder with depressed mood: Secondary | ICD-10-CM

## 2016-05-21 LAB — LIPID PANEL
CHOLESTEROL: 128 mg/dL (ref <200)
HDL: 50 mg/dL — AB (ref >50)
LDL CALC: 62 mg/dL (ref <100)
TRIGLYCERIDES: 79 mg/dL (ref <150)
Total CHOL/HDL Ratio: 2.6 Ratio (ref <5.0)
VLDL: 16 mg/dL (ref <30)

## 2016-05-21 LAB — BASIC METABOLIC PANEL WITH GFR
BUN: 18 mg/dL (ref 7–25)
CHLORIDE: 105 mmol/L (ref 98–110)
CO2: 26 mmol/L (ref 20–31)
Calcium: 9.9 mg/dL (ref 8.6–10.4)
Creat: 0.82 mg/dL (ref 0.50–0.99)
GFR, Est African American: 89 mL/min (ref >=60)
GFR, Est Non African American: 77 mL/min (ref >=60)
GLUCOSE: 113 mg/dL — AB (ref 65–99)
Potassium: 4.2 mmol/L (ref 3.5–5.3)
SODIUM: 142 mmol/L (ref 135–146)

## 2016-05-21 LAB — ALT: ALT: 14 U/L (ref 6–29)

## 2016-05-21 LAB — TSH: TSH: 2.21 mIU/L

## 2016-05-21 MED ORDER — INDACATEROL-GLYCOPYRROLATE 27.5-15.6 MCG IN CAPS: 1.0000 | ORAL_CAPSULE | Freq: Two times a day (BID) | RESPIRATORY_TRACT | 0 refills | Status: DC

## 2016-05-21 MED ORDER — TRAZODONE HCL 150 MG PO TABS: 150.0000 mg | ORAL_TABLET | Freq: Every day | ORAL | 1 refills | Status: DC

## 2016-05-21 NOTE — Progress Notes (Signed)
Patient ID: Grace [name], female   DOB: 1955/11/29, 61 y.o.   MRN: [id number]    Location:  [facility] Place of Service: OFFICE  Chief Complaint  Patient presents with  . Medical Management of Chronic Issues    3 months routince visit    HPI:  61 yo female seen today for f/u. She requested [employer] [place] to pick up trash cans for her due to chronic pain. Samples of utibron helped breathing better than anoro but cost >$100.   grief reaction - over mother's death in 18-Dec-2014. (+) insomnia and wakes up every hr. No relief with lorazepam but she still rakes it. Takes melatonin 19m. She takes trazodone for insomnia but wakes up every 2-3 hrs. She has suicidal thoughts. She is no longer seeing counselor through hospice. She admits that she is not coping well.  COPD/asthma hx - no recent exacerbations. She takes flovent, proair. [facility]. Anora caused chest pain. utibron helps but it is very expensive. She needs samples  Hypothyroid - TSH 0.14; Free T4 level 1. Takes Levothyroxine 150 mcg daily. (+) hot intolerance. She reports improved nails but still has issues with thinning hair.  Tobacco abuse - she stopped smoking but is now eating more. Weight 196-->173-->175-->190-->198 lbs since June 2017  HTN - BP fluctuating at home 120-130s/70-80s usually and occasional SBP 160s. Her lisinopril was stopped last month due to low BPs. She takes metoprolol  DM - A1c 5.9%. She takes glipizide. No low BS reactions. Urine microalbumin/Cr ratio 93 (down from 147.5)  Hyperlipidemia - stable on lipitor. LDL 52  Protein calorie malnutrition - improved albumin 4.1  Peripheral neuropathy - due to DM. Uncontrolled. She takes gabapentin 3074mBID.  GERD - takes low dose omeprazole. She occasionally has epigastric pain that awakens from sleep and causes nausea/vomiting. Pain resolves after emesis. She has a hx barrett's esophagus and has not seen GI in > 1 yr.  No hematemesis.  Past Medical History:  Diagnosis Date  . Abnormal vaginal bleeding   . Asthma   . Barrett's esophagus    with high grade dysplasia per endoscopy 05/2005 // followed [facility])  . Chronic back pain   . COPD (chronic obstructive pulmonary disease) ([facility]  . Diabetes mellitus without complication ([facility]  . Duodenal ulcer disease    thought to be contributed at least partly by overuse of NSAIDs and salicylates (goody powdr)  . Endometrial polyp 03/2003   s/p resection in 03/2003. Path showing submucosal leiomyoma and benign  proliferative type endometrium  . Fibroid uterus    s/p myomectomy x 2  . GERD (gastroesophageal reflux disease)   . H/O failed moderate sedation   . Hiatal hernia   . History of CVA (cerebrovascular accident) 10/2000  . 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 Hyperlipidemia   . Hypertension   . Hypothyroidism    with hx of multinodular goiter (noted on neck USKorean 082004-09-05 showing diffuse nodularity and inhomogenous texture diffusely BL)  . Internal hemorrhoids    grade 2 per colonoscopy in 09/2006 - repeat colonoscopy rec in 5-10 years.  . Migraine   . Nevus   . Personal history of failed conscious sedation 08/11/2011  . Tachycardia   . Thyroid disease   . Tobacco  abuse Jan 2014   quit    Past Surgical History:  Procedure Laterality Date  . CHOLECYSTECTOMY    . COLONOSCOPY  02/07/2010  . HYSTEROSCOPY    . LAPAROSCOPIC APPENDECTOMY N/A 09/23/2015   Procedure: APPENDECTOMY LAPAROSCOPIC;  Surgeon: Georganna Skeans, MD;  Location: [facility];  Service: General;  Laterality: N/A;  . [place]   x 2 - In 1997, noted to have extensive pelvic adhesions and BL tubal obstruction  . UPPER GASTROINTESTINAL ENDOSCOPY  12/18/2009    Patient Care Team: Gildardo Cranker, DO as PCP - General (Internal Medicine)  Social History   Social  History  . Marital status: Divorced    Spouse name: N/A  . Number of children: N/A  . Years of education: N/A   Occupational History  . Not on file.   Social History Main Topics  . Smoking status: Former Smoker    Packs/day: 1.00    Years: 42.00    Types: Cigarettes    Quit date: 05/09/2012  . Smokeless tobacco: Never Used     Comment: Counseling sheet [id number]   . Alcohol use No  . Drug use: No  . Sexual activity: No   Other Topics Concern  . Not on file   Social History Narrative         Financial assistance approved for 100% discount at [facility] and has [facility] card per Bonna Gains   01/15/2010      Diet- N/A   Caffeine- Goody powders   Married-Divorced   [employer] and lives with mother   Pets-2   Current/past profession-[employer]   Exercise-No   Living will-No   DNR-No   POA/HPOA-No              reports that she quit smoking about 4 years ago. Her smoking use included Cigarettes. She has a 42.00 pack-year smoking history. She has never used smokeless tobacco. She reports that she does not drink alcohol or use drugs.  Family History  Problem Relation Age of Onset  . COPD Mother   . Heart disease Father     had 5 heart attacks, first at age 42s.  Marland Kitchen COPD Father    Family Status  Relation Status  . Mother Deceased  . Father Deceased at age 57     Allergies  Allergen Reactions  . Anoro Ellipta [Umeclidinium-Vilanterol] Other (See Comments)    Chest pain if used more than 3 consecutive days   . Codeine     REACTION: headaches  . Prozac [Fluoxetine Hcl] Other (See Comments)    Suicidal ideations    Medications: Patient's Medications  New Prescriptions   No medications on file  Previous Medications   ALBUTEROL (PROAIR HFA) 108 (90 BASE) MCG/ACT INHALER    Inhale two puffs into lungs every 6 hours as needed for wheezing or for SOB   ASPIRIN-ACETAMINOPHEN-CAFFEINE 500-325-65 MG PACK    Take 1 Package by mouth as needed.    ATORVASTATIN (LIPITOR) 80 MG TABLET     TAKE ONE TABLET BY MOUTH  ONCE DAILY AT BEDTIME FOR  CHOLESTEROL   FLOVENT HFA 44 MCG/ACT INHALER    INHALE TWO PUFFS INTO LUNGS TWICE DAILY   GABAPENTIN (NEURONTIN) 300 MG CAPSULE    Take 1 capsule (300 mg total) by mouth 2 (two) times daily.   GLIPIZIDE (GLUCOTROL XL) 5 MG 24 HR TABLET    TAKE ONE TABLET BY MOUTH  ONCE DAILY TO CONTROL BLOOD SUGAR   GLUCOSAMINE-CHONDROITIN-MSM PO  Take 3 tablets by mouth daily.   IBUPROFEN (ADVIL,MOTRIN) 200 MG TABLET    Take 600 mg by mouth as needed for moderate pain.    INDACATEROL-GLYCOPYRROLATE (UTIBRON NEOHALER) 27.5-15.6 MCG CAPS    Place 1 capsule into inhaler and inhale 2 (two) times daily.   KETOTIFEN FUMARATE (REFRESH EYE ITCH RELIEF OP)    Place 1 drop into both eyes daily as needed. For itchy eyes   LEVOTHYROXINE (SYNTHROID, LEVOTHROID) 150 MCG TABLET    Take one tablet by mouth 30 minutes before breakfast once daily for thyroid   LISINOPRIL (PRINIVIL,ZESTRIL) 10 MG TABLET    Take 1/2 tablet by mouth once daily for blood pressure   LORAZEPAM (ATIVAN) 0.5 MG TABLET    Take one tablet by mouth three times daily as needed for anxiety or nervousness   METOPROLOL TARTRATE (LOPRESSOR) 25 MG TABLET    Take 0.5 tablets (12.5 mg total) by mouth 2 (two) times daily.   MULTIPLE VITAMINS-MINERALS (HAIR SKIN AND NAILS FORMULA PO)    Take 2 tablets by mouth daily.   OMEPRAZOLE (PRILOSEC) 20 MG CAPSULE    Take 1 capsule (20 mg total) by mouth daily.   ONETOUCH DELICA LANCETS 94B MISC    1 each by Does not apply route 2 (two) times daily.   ONETOUCH VERIO TEST STRIP    CHECK BLOOD SUGAR TWO TIMES DAILY AND AS DIRECTED   TRAZODONE (DESYREL) 100 MG TABLET    Take 1 tablet (100 mg total) by mouth at bedtime.   TURMERIC PO    Take 1 capsule by mouth daily.  Modified Medications   No medications on file  Discontinued Medications   OXYCODONE-ACETAMINOPHEN (PERCOCET/ROXICET) 5-325 MG TABLET    As needed, rx'ed after surgery    Review of Systems  Respiratory:  Positive for shortness of breath.   Neurological: Positive for numbness.  Psychiatric/Behavioral: Positive for dysphoric mood and sleep disturbance. The patient is nervous/anxious.   All other systems reviewed and are negative.   Vitals:   05/21/16 0927  BP: 110/70  Pulse: 95  Temp: 97.7 F (36.5 C)  TempSrc: Oral  SpO2: 96%  Weight: 198 lb (89.8 kg)  Height: 5' 3"  (1.6 m)   Body mass index is 35.07 kg/m.  Physical Exam  Constitutional: She appears well-developed and well-nourished.  HENT:  Mouth/Throat: Oropharynx is clear and moist. No oropharyngeal exudate.  Eyes: Pupils are equal, round, and reactive to light. No scleral icterus.  Neck: Neck supple. Carotid bruit is not present. No tracheal deviation present. No thyromegaly present.  Cardiovascular: Normal rate, regular rhythm, normal heart sounds and intact distal pulses.  Exam reveals no gallop and no friction rub.   No murmur heard. Trace LE edema b/l. no calf TTP.   Pulmonary/Chest: Effort normal and breath sounds normal. No stridor. No respiratory distress. She has no wheezes. She has no rales.  Reduced BS at base b/l  Abdominal: Soft. Bowel sounds are normal. She exhibits no distension and no mass. There is no hepatomegaly. There is tenderness (epigastric). There is no rebound and no guarding.  Musculoskeletal: She exhibits edema and tenderness.       Back:  Neg Tinel's b/l; no UE epicondyle TTP or swelling  Lymphadenopathy:    She has no cervical adenopathy.  Neurological: She is alert.  Skin: Skin is warm and dry. No lesion and no rash noted.  Psychiatric: Her speech is normal and behavior is normal. Judgment and thought content normal. She exhibits a  depressed mood.     Labs reviewed: No visits with results within 3 Month(s) from this visit.  Latest known visit with results is:  Office Visit on 02/15/2016  Component Date Value Ref Range Status  . Sodium 02/15/2016 140  135 - 146 mmol/L Final  . Potassium  02/15/2016 4.1  3.5 - 5.3 mmol/L Final  . Chloride 02/15/2016 103  98 - 110 mmol/L Final  . CO2 02/15/2016 26  20 - 31 mmol/L Final  . Glucose, Bld 02/15/2016 101* 65 - 99 mg/dL Final  . BUN 02/15/2016 17  7 - 25 mg/dL Final  . Creat 02/15/2016 0.76  0.50 - 0.99 mg/dL Final   Comment:   For patients > or = 61 years of age: The upper reference limit for Creatinine is approximately 13% higher for people identified as African-American.     . Total Bilirubin 02/15/2016 0.2  0.2 - 1.2 mg/dL Final  . Alkaline Phosphatase 02/15/2016 80  33 - 130 U/L Final  . AST 02/15/2016 15  10 - 35 U/L Final  . ALT 02/15/2016 12  6 - 29 U/L Final  . Total Protein 02/15/2016 7.2  6.1 - 8.1 g/dL Final  . Albumin 02/15/2016 4.1  3.6 - 5.1 g/dL Final  . Calcium 02/15/2016 9.9  8.6 - 10.4 mg/dL Final  . GFR, Est African American 02/15/2016 >89  >=60 mL/min Final  . GFR, Est Non African American 02/15/2016 86  >=60 mL/min Final  . Cholesterol 02/15/2016 131  125 - 200 mg/dL Final  . Triglycerides 02/15/2016 101  <150 mg/dL Final  . HDL 02/15/2016 59  >=46 mg/dL Final  . Total CHOL/HDL Ratio 02/15/2016 2.2  <=5.0 Ratio Final  . VLDL 02/15/2016 20  <30 mg/dL Final  . LDL Cholesterol 02/15/2016 52  <130 mg/dL Final   Comment:   Total Cholesterol/HDL Ratio:CHD Risk                        Coronary Heart Disease Risk Table                                        Men       Women          1/2 Average Risk              3.4        3.3              Average Risk              5.0        4.4           2X Average Risk              9.6        7.1           3X Average Risk             23.4       11.0 Use the calculated Patient Ratio above and the CHD Risk table  to determine the patient's CHD Risk.   . Hgb A1c MFr Bld 02/15/2016 5.9* <5.7 % Final   Comment:   For someone without known diabetes, a hemoglobin A1c value between 5.7% and 6.4% is consistent with prediabetes and should be confirmed with a follow-up test.     For someone  with known diabetes, a value <7% indicates that their diabetes is well controlled. A1c targets should be individualized based on duration of diabetes, age, co-morbid conditions and other considerations.   This assay result is consistent with an increased risk of diabetes.   Currently, no consensus exists regarding use of hemoglobin A1c for diagnosis of diabetes in children.     . Mean Plasma Glucose 02/15/2016 123  mg/dL Final    No results found.   Assessment/Plan   ICD-9-CM ICD-10-CM   1. Epigastric pain - with alarm signs 789.06 R10.13 Ambulatory referral to Gastroenterology  2. Barrett's esophagus with dysplasia 530.85 K22.719 Ambulatory referral to Gastroenterology  3. Grief reaction 309.0 F43.20 traZODone (DESYREL) 150 MG tablet  4. Neuropathy of hand, unspecified laterality 354.9 G56.90   5. Type 2 diabetes mellitus without complication, without long-term current use of insulin (HCC) 250.00 E11.9 BMP with eGFR     ALT     Hemoglobin A1c  6. Hypothyroidism due to acquired atrophy of thyroid 244.8 E03.4 TSH   246.8    7. Essential hypertension 401.9 I10   8. Hyperlipidemia LDL goal <100 272.4 E78.5 Lipid Panel  9. Chronic low back pain, unspecified back pain laterality, with sciatica presence unspecified 724.2 M54.5    338.29 G89.29   10. COPD with asthma ([place]) 493.20 J44.9     Increase gabapentin to 3 times daily  Increase trazodone to 1.5 tabs (TOTAL dose 136m) daily until complete bottle then start new dose of 1530mtabs to take 1 daily.  Continue other medications as ordered  Will call with GI referral  Will call with lab results  Recommend follow up with counseling for grief reaction  Samples of Utibron given to use as directed  Follow up in 3 mos for routine visit   Lanita Stammen S. [facility]Per[facility] [address] [phone] (Monday-Friday 8 AM - 5  PM) [phone] 5 PM and follow prompts

## 2016-05-21 NOTE — Patient Instructions (Addendum)
Increase gabapentin to 3 times daily  Increase trazodone to 1.5 tabs (TOTAL dose 150mg ) daily until complete bottle then start new dose of 150mg  tabs to take 1 daily.  Continue other medications as ordered  Will call with GI referral  Will call with lab results  Recommend follow up with counseling for grief reaction  Samples of Utibron given to use as directed  Follow up in 3 mos for routine visit

## 2016-05-22 LAB — HEMOGLOBIN A1C
Hgb A1c MFr Bld: 6 % — ABNORMAL HIGH (ref <5.7)
Mean Plasma Glucose: 126 mg/dL

## 2016-05-29 ENCOUNTER — Ambulatory Visit (INDEPENDENT_AMBULATORY_CARE_PROVIDER_SITE_OTHER): Payer: Medicare Other | Admitting: Nurse Practitioner

## 2016-05-29 ENCOUNTER — Encounter: Payer: Self-pay | Admitting: Nurse Practitioner

## 2016-05-29 VITALS — BP 118/74 | HR 77 | Temp 97.8°F | Resp 18 | Ht 63.0 in | Wt 200.2 lb

## 2016-05-29 DIAGNOSIS — J0100 Acute maxillary sinusitis, unspecified: Secondary | ICD-10-CM | POA: Diagnosis not present

## 2016-05-29 MED ORDER — AMOXICILLIN-POT CLAVULANATE 875-125 MG PO TABS: 1.0000 | ORAL_TABLET | Freq: Two times a day (BID) | ORAL | 0 refills | Status: DC

## 2016-05-29 MED ORDER — LISINOPRIL 10 MG PO TABS: ORAL_TABLET | ORAL | 1 refills | Status: DC

## 2016-05-29 MED ORDER — METOPROLOL TARTRATE 25 MG PO TABS: 12.5000 mg | ORAL_TABLET | Freq: Two times a day (BID) | ORAL | 1 refills | Status: DC

## 2016-05-29 MED ORDER — ONETOUCH ULTRA CONTROL VI SOLN: 0 refills | Status: DC

## 2016-05-29 MED ORDER — OMEPRAZOLE 20 MG PO CPDR: 20.0000 mg | DELAYED_RELEASE_CAPSULE | Freq: Every day | ORAL | 1 refills | Status: DC

## 2016-05-29 NOTE — Patient Instructions (Addendum)
Augmentin by mouth twice daily for 10 days May use florastor twice daily for 2 weeks       Sinusitis, Adult Sinusitis is soreness and inflammation of your sinuses. Sinuses are hollow spaces in the bones around your face. Your sinuses are located:  Around your eyes.  In the middle of your forehead.  Behind your nose.  In your cheekbones. Your sinuses and nasal passages are lined with a stringy fluid (mucus). Mucus normally drains out of your sinuses. When your nasal tissues become inflamed or swollen, the mucus can become trapped or blocked so air cannot flow through your sinuses. This allows bacteria, viruses, and funguses to grow, which leads to infection. Sinusitis can develop quickly and last for 7?10 days (acute) or for more than 12 weeks (chronic). Sinusitis often develops after a cold. What are the causes? This condition is caused by anything that creates swelling in the sinuses or stops mucus from draining, including:  Allergies.  Asthma.  Bacterial or viral infection.  Abnormally shaped bones between the nasal passages.  Nasal growths that contain mucus (nasal polyps).  Narrow sinus openings.  Pollutants, such as chemicals or irritants in the air.  A foreign object stuck in the nose.  A fungal infection. This is rare. What increases the risk? The following factors may make you more likely to develop this condition:  Having allergies or asthma.  Having had a recent cold or respiratory tract infection.  Having structural deformities or blockages in your nose or sinuses.  Having a weak immune system.  Doing a lot of swimming or diving.  Overusing nasal sprays.  Smoking. What are the signs or symptoms? The main symptoms of this condition are pain and a feeling of pressure around the affected sinuses. Other symptoms include:  Upper toothache.  Earache.  Headache.  Bad breath.  Decreased sense of smell and taste.  A cough that may get worse at  night.  Fatigue.  Fever.  Thick drainage from your nose. The drainage is often green and it may contain pus (purulent).  Stuffy nose or congestion.  Postnasal drip. This is when extra mucus collects in the throat or back of the nose.  Swelling and warmth over the affected sinuses.  Sore throat.  Sensitivity to light. How is this diagnosed? This condition is diagnosed based on symptoms, a medical history, and a physical exam. To find out if your condition is acute or chronic, your health care provider may:  Look in your nose for signs of nasal polyps.  Tap over the affected sinus to check for signs of infection.  View the inside of your sinuses using an imaging device that has a light attached (endoscope). If your health care provider suspects that you have chronic sinusitis, you may also:  Be tested for allergies.  Have a sample of mucus taken from your nose (nasal culture) and checked for bacteria.  Have a mucus sample examined to see if your sinusitis is related to an allergy. If your sinusitis does not respond to treatment and it lasts longer than 8 weeks, you may have an MRI or CT scan to check your sinuses. These scans also help to determine how severe your infection is. In rare cases, a bone biopsy may be done to rule out more serious types of fungal sinus disease. How is this treated? Treatment for sinusitis depends on the cause and whether your condition is chronic or acute. If a virus is causing your sinusitis, your symptoms will go  away on their own within 10 days. You may be given medicines to relieve your symptoms, including:  Topical nasal decongestants. They shrink swollen nasal passages and let mucus drain from your sinuses.  Antihistamines. These drugs block inflammation that is triggered by allergies. This can help to ease swelling in your nose and sinuses.  Topical nasal corticosteroids. These are nasal sprays that ease inflammation and swelling in your nose  and sinuses.  Nasal saline washes. These rinses can help to get rid of thick mucus in your nose. If your condition is caused by bacteria, you will be given an antibiotic medicine. If your condition is caused by a fungus, you will be given an antifungal medicine. Surgery may be needed to correct underlying conditions, such as narrow nasal passages. Surgery may also be needed to remove polyps. Follow these instructions at home: Medicines  Take, use, or apply over-the-counter and prescription medicines only as told by your health care provider. These may include nasal sprays.  If you were prescribed an antibiotic medicine, take it as told by your health care provider. Do not stop taking the antibiotic even if you start to feel better. Hydrate and Humidify  Drink enough water to keep your urine clear or pale yellow. Staying hydrated will help to thin your mucus.  Use a cool mist humidifier to keep the humidity level in your home above 50%.  Inhale steam for 10-15 minutes, 3-4 times a day or as told by your health care provider. You can do this in the bathroom while a hot shower is running.  Limit your exposure to cool or dry air. Rest  Rest as much as possible.  Sleep with your head raised (elevated).  Make sure to get enough sleep each night. General instructions  Apply a warm, moist washcloth to your face 3-4 times a day or as told by your health care provider. This will help with discomfort.  Wash your hands often with soap and water to reduce your exposure to viruses and other germs. If soap and water are not available, use hand sanitizer.  Do not smoke. Avoid being around people who are smoking (secondhand smoke).  Keep all follow-up visits as told by your health care provider. This is important. Contact a health care provider if:  You have a fever.  Your symptoms get worse.  Your symptoms do not improve within 10 days. Get help right away if:  You have a severe  headache.  You have persistent vomiting.  You have pain or swelling around your face or eyes.  You have vision problems.  You develop confusion.  Your neck is stiff.  You have trouble breathing. This information is not intended to replace advice given to you by your health care provider. Make sure you discuss any questions you have with your health care provider. Document Released: 03/31/2005 Document Revised: 11/25/2015 Document Reviewed: 01/24/2015 Elsevier Interactive Patient Education  2017 [employer].

## 2016-05-29 NOTE — Progress Notes (Signed)
Careteam: Patient Care Team: Gildardo Cranker, DO as PCP - General (Internal Medicine)   Allergies  Allergen Reactions  . Anoro Ellipta [Umeclidinium-Vilanterol] Other (See Comments)    Chest pain if used more than 3 consecutive days   . Codeine     REACTION: headaches  . Prozac [Fluoxetine Hcl] Other (See Comments)    Suicidal ideations    Chief Complaint  Patient presents with  . Acute Visit    Sinus pressure/congestion, facial pain x 5 days. Has had a running nose x few weeks.      HPI: Patient is a 61 y.o. female seen in the office today with complaints of facial pain, nasal congestion, headache, and sinus pressure for the last two weeks. Her symptoms began as a runny, watery nose which has progressively worsened over time. Her pain, rated a 6-7 out of 10 in severity, is located at her maxillary sinuses with frontal tenderness.She denies sore throat, coughing, fever, N/V/D. She has tried vaporizer, saline, Neti pot, Day Quil, and Goody powder for facial pain and headche relief. She also is complaining of halitosis along with her other symptoms.     Review of Systems:  Review of Systems  Constitutional: Positive for fatigue. Negative for appetite change, chills and fever.  HENT: Positive for postnasal drip, sinus pain and sinus pressure. Negative for congestion.   Respiratory: Negative for cough, chest tightness, shortness of breath and wheezing.   Cardiovascular: Negative for chest pain, palpitations and leg swelling.  Gastrointestinal: Negative for diarrhea, nausea and vomiting.  Neurological: Positive for headaches.    Past Medical History:  Diagnosis Date  . Abnormal vaginal bleeding   . Asthma   . Barrett's esophagus    with high grade dysplasia per endoscopy 05/2005 // followed by Dr. [name] ([facility])  . Chronic back pain   . COPD (chronic obstructive pulmonary disease) ([place])   . Diabetes mellitus without complication ([employer])   . Duodenal ulcer disease    thought to be contributed at least partly by overuse of NSAIDs and salicylates (goody powdr)  . Endometrial polyp 03/2003   s/p resection in 03/2003. Path showing submucosal leiomyoma and benign  proliferative type endometrium  . Fibroid uterus    s/p myomectomy x 2  . GERD (gastroesophageal reflux disease)   . H/O failed moderate sedation   . Hiatal hernia   . History of CVA (cerebrovascular accident) 10/2000  . 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 Hyperlipidemia   . Hypertension   . Hypothyroidism    with hx of multinodular goiter (noted on neck US in 11/2002 - showing diffuse nodularity and inhomogenous texture diffusely BL)  . Internal hemorrhoids    grade 2 per colonoscopy in 09/2006 - repeat colonoscopy rec in 5-10 years.  . Migraine   . Nevus   . Personal history of failed conscious sedation 08/11/2011  . Tachycardia   . Thyroid disease   . Tobacco abuse Jan 2014   quit   Past Surgical History:  Procedure Laterality Date  . CHOLECYSTECTOMY    . COLONOSCOPY  02/07/2010  . HYSTEROSCOPY    . LAPAROSCOPIC APPENDECTOMY N/A 09/23/2015   Procedure: APPENDECTOMY LAPAROSCOPIC;  Surgeon: Georganna Skeans, MD;  Location: [facility];  Service: General;  Laterality: N/A;  . [place]   x 2 - In 1997, noted to have  extensive pelvic adhesions and BL tubal obstruction  . UPPER GASTROINTESTINAL ENDOSCOPY  12/18/2009   Social History:   reports that she quit smoking about 4 years ago. Her smoking use included Cigarettes. She has a 42.00 pack-year smoking history. She has never used smokeless tobacco. She reports that she does not drink alcohol or use drugs.  Family History  Problem Relation Age of Onset  . COPD Mother   . Heart disease Father     had 5 heart attacks, first at age 22s.  Marland Kitchen COPD Father     Medications: Patient's Medications  New Prescriptions   No medications on file    Previous Medications   ALBUTEROL (PROAIR HFA) 108 (90 BASE) MCG/ACT INHALER    Inhale two puffs into lungs every 6 hours as needed for wheezing or for SOB   ASPIRIN-ACETAMINOPHEN-CAFFEINE 500-325-65 MG PACK    Take 1 Package by mouth as needed.    ATORVASTATIN (LIPITOR) 80 MG TABLET    TAKE ONE TABLET BY MOUTH  ONCE DAILY AT BEDTIME FOR  CHOLESTEROL   FLOVENT HFA 44 MCG/ACT INHALER    INHALE TWO PUFFS INTO LUNGS TWICE DAILY   GABAPENTIN (NEURONTIN) 300 MG CAPSULE    Take 1 capsule (300 mg total) by mouth 2 (two) times daily.   GLIPIZIDE (GLUCOTROL XL) 5 MG 24 HR TABLET    TAKE ONE TABLET BY MOUTH  ONCE DAILY TO CONTROL BLOOD SUGAR   GLUCOSAMINE-CHONDROITIN-MSM PO    Take 3 tablets by mouth daily.   IBUPROFEN (ADVIL,MOTRIN) 200 MG TABLET    Take 600 mg by mouth as needed for moderate pain.    INDACATEROL-GLYCOPYRROLATE (UTIBRON NEOHALER) 27.5-15.6 MCG CAPS    Place 1 capsule into inhaler and inhale 2 (two) times daily.   KETOTIFEN FUMARATE (REFRESH EYE ITCH RELIEF OP)    Place 1 drop into both eyes daily as needed. For itchy eyes   LEVOTHYROXINE (SYNTHROID, LEVOTHROID) 150 MCG TABLET    Take one tablet by mouth 30 minutes before breakfast once daily for thyroid   LISINOPRIL (PRINIVIL,ZESTRIL) 10 MG TABLET    Take 1/2 tablet by mouth once daily for blood pressure   LORAZEPAM (ATIVAN) 0.5 MG TABLET    Take one tablet by mouth three times daily as needed for anxiety or nervousness   METOPROLOL TARTRATE (LOPRESSOR) 25 MG TABLET    Take 0.5 tablets (12.5 mg total) by mouth 2 (two) times daily.   MULTIPLE VITAMINS-MINERALS (HAIR SKIN AND NAILS FORMULA PO)    Take 2 tablets by mouth daily.   OMEPRAZOLE (PRILOSEC) 20 MG CAPSULE    Take 1 capsule (20 mg total) by mouth daily.   ONETOUCH DELICA LANCETS 99991111 MISC    1 each by Does not apply route 2 (two) times daily.   ONETOUCH VERIO TEST STRIP    CHECK BLOOD SUGAR TWO TIMES DAILY AND AS DIRECTED   TRAZODONE (DESYREL) 150 MG TABLET    Take 1 tablet (150 mg  total) by mouth at bedtime.   TURMERIC PO    Take 1 capsule by mouth daily.  Modified Medications   No medications on file  Discontinued Medications   No medications on file     Physical Exam:  There were no vitals filed for this visit. There is no height or weight on file to calculate BMI.  Physical Exam  Constitutional: She is oriented to person, place, and time. She appears well-developed and well-nourished. No distress.  HENT:  Head: Normocephalic and atraumatic.  Right Ear: External ear normal.  Left Ear: External ear normal.  Mouth/Throat: Oropharynx is clear and moist. No oropharyngeal exudate.  Right canal cerumen impaction, left sided normal Inflammed left greater than right nasal turbinates   Eyes: Conjunctivae and EOM are normal. Pupils are equal, round, and reactive to light. Right eye exhibits no discharge. Left eye exhibits no discharge.  Neck: Normal range of motion. Neck supple.  Cardiovascular: Normal rate, regular rhythm, normal heart sounds and intact distal pulses.   No murmur heard. Pulmonary/Chest: Effort normal and breath sounds normal. No respiratory distress. She has no wheezes.  Neurological: She is alert and oriented to person, place, and time.  Skin: Skin is warm and dry.  Psychiatric: She has a normal mood and affect. Her behavior is normal.    Labs reviewed: Basic Metabolic Panel:  Recent Labs  09/26/15 0529 09/27/15 0602 09/28/15 0630  12/27/15 0917 01/25/16 0927 02/15/16 1041 05/21/16 1026  NA 139 136 140  < >  --  144 140 142  K 3.1* 3.2* 4.0  < >  --  4.4 4.1 4.2  CL 98* 93* 98*  < >  --  108 103 105  CO2 34* 33* 34*  < >  --  25 26 26   GLUCOSE 164* 155* 117*  < >  --  70 101* 113*  BUN 17 14 13   < >  --  17 17 18   CREATININE 0.94 0.66 0.85  < >  --  0.79 0.76 0.82  CALCIUM 9.0 8.5* 8.9  < >  --  8.9 9.9 9.9  MG 1.9 1.9 1.7  --   --   --   --   --   PHOS 2.1* 2.3* 2.6  --   --   --   --   --   TSH  --   --   --   < > 0.04* 0.14*   --  2.21  < > = values in this interval not displayed. Liver Function Tests:  Recent Labs  11/14/15 1039 01/25/16 0927 02/15/16 1041 05/21/16 1026  AST 14 46* 15  --   ALT 9 57* 12 14  ALKPHOS 76 87 80  --   BILITOT 0.3 0.2 0.2  --   PROT 6.8 6.3 7.2  --   ALBUMIN 3.5* 3.6 4.1  --    No results for input(s): LIPASE, AMYLASE in the last 8760 hours. No results for input(s): AMMONIA in the last 8760 hours. CBC:  Recent Labs  09/23/15 1157  10/01/15 0831 10/08/15 1407 10/19/15 11/07/15 0929  WBC 19.3*  < > 18.8* 15.3* 14.0 5.8  NEUTROABS 16.0*  --   --  12.0*  --  3,016  HGB 13.5  < > 12.2  --  10.2* 12.1  HCT 42.9  < > 40.8 38.1 32* 38.1  MCV 90.1  < > 93.2 90  --  87.8  PLT 211  < > 300 633* 380 313  < > = values in this interval not displayed. Lipid Panel:  Recent Labs  11/14/15 1039 02/15/16 1041 05/21/16 1026  CHOL 132 131 128  HDL 45* 59 50*  LDLCALC 65 52 62  TRIG 112 101 79  CHOLHDL 2.9 2.2 2.6   TSH:  Recent Labs  12/27/15 0917 01/25/16 0927 05/21/16 1026  TSH 0.04* 0.14* 2.21   A1C: Lab Results  Component Value Date   HGBA1C 6.0 (H) 05/21/2016     Assessment/Plan 1. Acute non-recurrent maxillary  sinusitis - amoxicillin-clavulanate (AUGMENTIN) 875-125 MG tablet; Take 1 tablet by mouth 2 (two) times daily.  Dispense: 20 tablet; Refill: 0 -increase oral fluids -may take tylenol OTC as directed for headache and facial pain -continue heat packs to face, vaporizer, Neti Pot as needed for pressure and congestion relief.  -If symptoms worsen,presence of fever, or worsening pain, call office for follow up  Nylene Inlow K. Harle Battiest  [facility] [phone] 8 am - 5 pm) [phone] (after hours)

## 2016-06-06 ENCOUNTER — Telehealth: Payer: Self-pay | Admitting: *Deleted

## 2016-06-06 NOTE — Telephone Encounter (Signed)
Received fax from [employer], a clinical research company stating that patient has recently enrolled in a clinical research study at [place]: "A Phase 3, placebo controlled, randomized, observer blinded study to evaluate the efficacy, safety and tolerability of a Clostridium difficile Vaccine in adults 61 years of age and older" Dr. Biagio Borg.  Fax placed in Dr. Vale Haven folder to review.

## 2016-07-01 ENCOUNTER — Ambulatory Visit: Payer: Medicare Other | Admitting: Internal Medicine

## 2016-07-12 ENCOUNTER — Other Ambulatory Visit: Payer: Self-pay | Admitting: Internal Medicine

## 2016-07-12 DIAGNOSIS — E119 Type 2 diabetes mellitus without complications: Secondary | ICD-10-CM

## 2016-07-29 ENCOUNTER — Telehealth: Payer: Self-pay | Admitting: *Deleted

## 2016-07-29 NOTE — Telephone Encounter (Signed)
Patient called and stated that her gum abscess has flared up and face is swollen. Stated that she can't go to the Dentist because she can't afford. Requesting an antibiotic to be called in. Please Advise (Dr. Eulas Post Patient)

## 2016-07-29 NOTE — Telephone Encounter (Signed)
Rx: amoxicillin 500 mg (21) one three times daily for infection.

## 2016-07-30 MED ORDER — AMOXICILLIN 500 MG PO CAPS: 500.0000 mg | ORAL_CAPSULE | Freq: Three times a day (TID) | ORAL | 0 refills | Status: DC

## 2016-07-30 NOTE — Telephone Encounter (Signed)
Rx faxed to pharmacy. Patient notified and agreed.  

## 2016-07-31 ENCOUNTER — Other Ambulatory Visit: Payer: Self-pay

## 2016-07-31 MED ORDER — LORAZEPAM 0.5 MG PO TABS: ORAL_TABLET | ORAL | 1 refills | Status: DC

## 2016-08-20 DIAGNOSIS — H524 Presbyopia: Secondary | ICD-10-CM | POA: Diagnosis not present

## 2016-08-20 DIAGNOSIS — H5203 Hypermetropia, bilateral: Secondary | ICD-10-CM | POA: Diagnosis not present

## 2016-08-20 DIAGNOSIS — H52223 Regular astigmatism, bilateral: Secondary | ICD-10-CM | POA: Diagnosis not present

## 2016-08-20 DIAGNOSIS — E119 Type 2 diabetes mellitus without complications: Secondary | ICD-10-CM | POA: Diagnosis not present

## 2016-08-22 ENCOUNTER — Telehealth: Payer: Self-pay

## 2016-08-22 DIAGNOSIS — J449 Chronic obstructive pulmonary disease, unspecified: Secondary | ICD-10-CM

## 2016-08-22 DIAGNOSIS — J9601 Acute respiratory failure with hypoxia: Secondary | ICD-10-CM

## 2016-08-22 MED ORDER — NEBULIZER/TUBING/MOUTHPIECE KIT: PACK | 11 refills | Status: DC

## 2016-08-22 NOTE — Telephone Encounter (Signed)
Okay to approve.

## 2016-08-22 NOTE — Telephone Encounter (Signed)
Order faxed, medication added back to list

## 2016-08-22 NOTE — Telephone Encounter (Addendum)
Patient is using a nebulizer that belonged to her mother before she passed away.  Patient states Dr.Carter authorized for her to use nebulizer.  RX was provided by [facility].  RX  Is for duoneb 1 via every 6 hours as needed for SOB & wheezing (removed from medication list on 10/18/15 with the indication completed course)  Please advise if ok to add duoneb back to medication list (patient does not need rx at this time for she has medication on hand)   Nebulizer was supplied through [facility], patient is requesting tubing, mouth piece and filter for nebulizer, please advise if ok to provide order for [facility] (fax [phone])  Marca AnconaRaeanne Gathers   Please advise

## 2016-08-27 ENCOUNTER — Encounter: Payer: Self-pay | Admitting: Gynecology

## 2016-08-27 DIAGNOSIS — J449 Chronic obstructive pulmonary disease, unspecified: Secondary | ICD-10-CM | POA: Diagnosis not present

## 2016-08-27 DIAGNOSIS — J9601 Acute respiratory failure with hypoxia: Secondary | ICD-10-CM | POA: Diagnosis not present

## 2016-08-28 ENCOUNTER — Encounter: Payer: Self-pay | Admitting: Internal Medicine

## 2016-09-19 ENCOUNTER — Ambulatory Visit (INDEPENDENT_AMBULATORY_CARE_PROVIDER_SITE_OTHER): Payer: Medicare Other | Admitting: Internal Medicine

## 2016-09-19 ENCOUNTER — Encounter: Payer: Self-pay | Admitting: Internal Medicine

## 2016-09-19 VITALS — BP 120/80 | HR 81 | Temp 98.5°F | Ht 63.0 in | Wt 204.0 lb

## 2016-09-19 DIAGNOSIS — E785 Hyperlipidemia, unspecified: Secondary | ICD-10-CM | POA: Diagnosis not present

## 2016-09-19 DIAGNOSIS — G5690 Unspecified mononeuropathy of unspecified upper limb: Secondary | ICD-10-CM | POA: Diagnosis not present

## 2016-09-19 DIAGNOSIS — F4321 Adjustment disorder with depressed mood: Secondary | ICD-10-CM

## 2016-09-19 DIAGNOSIS — E1140 Type 2 diabetes mellitus with diabetic neuropathy, unspecified: Secondary | ICD-10-CM

## 2016-09-19 DIAGNOSIS — M545 Low back pain: Secondary | ICD-10-CM

## 2016-09-19 DIAGNOSIS — F4320 Adjustment disorder, unspecified: Secondary | ICD-10-CM

## 2016-09-19 DIAGNOSIS — I10 Essential (primary) hypertension: Secondary | ICD-10-CM | POA: Diagnosis not present

## 2016-09-19 DIAGNOSIS — E034 Atrophy of thyroid (acquired): Secondary | ICD-10-CM

## 2016-09-19 DIAGNOSIS — K22719 Barrett's esophagus with dysplasia, unspecified: Secondary | ICD-10-CM | POA: Diagnosis not present

## 2016-09-19 DIAGNOSIS — J449 Chronic obstructive pulmonary disease, unspecified: Secondary | ICD-10-CM | POA: Diagnosis not present

## 2016-09-19 DIAGNOSIS — G8929 Other chronic pain: Secondary | ICD-10-CM | POA: Diagnosis not present

## 2016-09-19 LAB — TSH: TSH: 0.81 mIU/L

## 2016-09-19 MED ORDER — GABAPENTIN 300 MG PO CAPS: 600.0000 mg | ORAL_CAPSULE | Freq: Two times a day (BID) | ORAL | 1 refills | Status: DC

## 2016-09-19 MED ORDER — GLIPIZIDE ER 5 MG PO TB24: 5.0000 mg | ORAL_TABLET | Freq: Every day | ORAL | 3 refills | Status: DC

## 2016-09-19 MED ORDER — LEVOTHYROXINE SODIUM 150 MCG PO TABS: ORAL_TABLET | ORAL | 3 refills | Status: DC

## 2016-09-19 NOTE — Patient Instructions (Signed)
INCREASE GABAPENTIN 300MG  take 2 caps 2 times daily  Continue other medications as ordered  Will call with lab results  Follow up in 3 mos for DM, neuropathy

## 2016-09-19 NOTE — Progress Notes (Signed)
Patient ID: Grace [name], female   DOB: [birth year], 61 y.o.   MRN: [id number]    Location:  [facility] Place of Service: OFFICE  Chief Complaint  Patient presents with  . Medical Management of Chronic Issues    43mh follow-up    HPI:  61yo female seen today for f/u. She was seen by [facility] clinician on 08/06/16. No significant findings noted. No other concerns,  grief reaction - over mother's death in AAug 28, 2016 (+) insomnia and wakes up every hr. No relief with lorazepam but she still rakes it. Takes melatonin 153m She takes trazodone for insomnia but wakes up every 2-3 hrs. She has suicidal thoughts. She is no longer seeing counselor through hospice. She admits that she is not coping well.  COPD/asthma hx - no recent exacerbations. She takes flovent, proair. [facility]. Anora caused chest pain. utibron helps but it is very expensive. She needs samples  Hypothyroid - TSH 0.14; Free T4 level 1. Takes Levothyroxine 150 mcg daily. (+) hot intolerance. She reports improved nails but still has issues with thinning hair.  Tobacco abuse - she stopped smoking but is now eating more. Weight 196-->173-->175-->190-->198 lbs since June 2017  HTN - BP fluctuating at home 120-130s/70-80s usually and occasional SBP 160s. Her lisinopril was stopped last month due to low BPs. She takes metoprolol  DM - A1c 6%. She takes glipizide. No low BS reactions. Urine microalbumin/Cr ratio 93 (down from 147.5). BS 100-120s.  Hyperlipidemia - stable on lipitor. LDL 62  Protein calorie malnutrition - improved albumin 4.1  Peripheral neuropathy - due to DM. Uncontrolled. She takes gabapentin 30069mID.  GERD - takes low dose omeprazole. She occasionally has epigastric pain that awakens from sleep and causes nausea/vomiting. Pain resolves after emesis. She has a hx barrett's esophagus and has not seen GI in > 1 yr. No hematemesis.  Past Medical History:  Diagnosis  Date  . Abnormal vaginal bleeding   . Asthma   . Barrett's esophagus    with high grade dysplasia per endoscopy 05/2005 // followed by Dr. [facility])  . Chronic back pain   . COPD [place]ch[place] obstructive [place] disease) ([facility] [place] . [place] mellitus without complication ([facility] . Duodenal ulcer disease    thought to be contributed at least partly by overuse of NSAIDs and salicylates (goody powdr)  . Endometrial polyp 03/2003   s/p resection in 03/2003. Path showing submucosal leiomyoma and benign  proliferative type endometrium  . Fibroid uterus    s/p myomectomy x 2  . GERD (gastroesophageal reflux disease)   . H/O failed moderate sedation   . Hiatal hernia   . History of CVA (cerebrovascular accident) 10/2000  . 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 Hyperlipidemia   . Hypertension   . Hypothyroidism    with hx of multinodular goiter (noted on neck [place] 11/20/26/04showing diffuse nodularity and inhomogenous texture diffusely BL)  . Internal hemorrhoids    grade 2 per colonoscopy in 09/2006 - repeat colonoscopy rec in 5-10 years.  . Migraine   . Nevus   . Personal history of failed conscious sedation 08/11/2011  . Tachycardia   . Thyroid disease   . Tobacco abuse Jan 2014   quit    Past Surgical History:  Procedure Laterality Date  . CHOLECYSTECTOMY    . COLONOSCOPY  02/07/2010  . HYSTEROSCOPY    . LAPAROSCOPIC APPENDECTOMY N/A 09/23/2015   Procedure: APPENDECTOMY LAPAROSCOPIC;  Surgeon: Georganna Skeans, MD;  Location: [place];  Service: General;  Laterality: N/A;  . [place]   x 2 - In 1997, noted to have extensive pelvic adhesions and BL tubal obstruction  . UPPER GASTROINTESTINAL ENDOSCOPY  12/18/2009    Patient Care Team: Gildardo Cranker, DO as PCP - General (Internal Medicine)  Social History   Social History  . Marital status: Divorced    Spouse name:  N/A  . Number of children: N/A  . Years of education: N/A   Occupational History  . Not on file.   Social History Main Topics  . Smoking status: Former Smoker    Packs/day: 1.00    Years: 42.00    Types: Cigarettes    Quit date: 05/09/2012  . Smokeless tobacco: Never Used     Comment: Counseling sheet [id number]   . Alcohol use No  . Drug use: No  . Sexual activity: No   Other Topics Concern  . Not on file   Social History Narrative         Financial assistance approved for 100% discount at [facility] and has [facility] card per Bonna Gains   01/15/2010      Diet- N/A   Caffeine- Goody powders   Married-Divorced   [employer] and lives with mother   Pets-2   Current/past profession-[employer]   Exercise-No   Living will-No   DNR-No   POA/HPOA-No              reports that she quit smoking about 4 years ago. Her smoking use included Cigarettes. She has a 42.00 pack-year smoking history. She has never used smokeless tobacco. She reports that she does not drink alcohol or use drugs.  Family History  Problem Relation Age of Onset  . COPD Mother   . Heart disease Father        had 5 heart attacks, first at age 87s.  Marland Kitchen COPD Father    Family Status  Relation Status  . Mother Deceased  . Father Deceased at age 75     Allergies  Allergen Reactions  . Anoro Ellipta [Umeclidinium-Vilanterol] Other (See Comments)    Chest pain if used more than 3 consecutive days   . Codeine     REACTION: headaches  . Prozac [Fluoxetine Hcl] Other (See Comments)    Suicidal ideations    Medications: Patient's Medications  New Prescriptions   No medications on file  Previous Medications   ALBUTEROL (PROAIR HFA) 108 (90 BASE) MCG/ACT INHALER    Inhale two puffs into lungs every 6 hours as needed for wheezing or for SOB   ASPIRIN-ACETAMINOPHEN-CAFFEINE 500-325-65 MG PACK    Take 1 Package by mouth as needed.    ATORVASTATIN (LIPITOR) 80 MG TABLET    TAKE ONE TABLET BY MOUTH  ONCE DAILY AT BEDTIME  FOR  CHOLESTEROL   FLOVENT HFA 44 MCG/ACT INHALER    INHALE TWO PUFFS INTO LUNGS TWICE DAILY   GLUCOSAMINE-CHONDROITIN-MSM PO    Take 3 tablets by mouth daily.   IBUPROFEN (ADVIL,MOTRIN) 200 MG TABLET    Take 600 mg by mouth as needed for moderate pain.    INDACATEROL-GLYCOPYRROLATE (UTIBRON NEOHALER) 27.5-15.6 MCG CAPS    Place 1 capsule into inhaler and inhale 2 (two) times daily.   IPRATROPIUM-ALBUTEROL (DUONEB) 0.5-2.5 (3)  MG/3ML SOLN    Take 3 mLs by nebulization every 6 (six) hours as needed. For shortness of breath or wheezing   KETOTIFEN FUMARATE (REFRESH EYE ITCH RELIEF OP)    Place 1 drop into both eyes daily as needed. For itchy eyes   LISINOPRIL (PRINIVIL,ZESTRIL) 10 MG TABLET    Take 1/2 tablet by mouth once daily for blood pressure   LORAZEPAM (ATIVAN) 0.5 MG TABLET    Take one tablet by mouth three times daily as needed for anxiety or nervousness   METOPROLOL TARTRATE (LOPRESSOR) 25 MG TABLET    Take 0.5 tablets (12.5 mg total) by mouth 2 (two) times daily.   MULTIPLE VITAMINS-MINERALS (HAIR SKIN AND NAILS FORMULA PO)    Take 2 tablets by mouth daily.   OMEPRAZOLE (PRILOSEC) 20 MG CAPSULE    Take 1 capsule (20 mg total) by mouth daily.   ONETOUCH DELICA LANCETS 69C MISC    USE TWO TIMES DAILY   ONETOUCH VERIO TEST STRIP    CHECK BLOOD SUGAR TWO TIMES DAILY AND AS DIRECTED   TRAZODONE (DESYREL) 150 MG TABLET    Take 1 tablet (150 mg total) by mouth at bedtime.   TURMERIC PO    Take 1 capsule by mouth daily.  Modified Medications   Modified Medication Previous Medication   GLIPIZIDE (GLUCOTROL XL) 5 MG 24 HR TABLET glipiZIDE (GLUCOTROL XL) 5 MG 24 hr tablet      Take 1 tablet (5 mg total) by mouth daily with breakfast.    TAKE ONE TABLET BY MOUTH  ONCE DAILY TO CONTROL BLOOD SUGAR   LEVOTHYROXINE (SYNTHROID, LEVOTHROID) 150 MCG TABLET levothyroxine (SYNTHROID, LEVOTHROID) 150 MCG tablet      Take one tablet by mouth 30 minutes before breakfast once daily for thyroid    Take one  tablet by mouth 30 minutes before breakfast once daily for thyroid  Discontinued Medications   AMOXICILLIN (AMOXIL) 500 MG CAPSULE    Take 1 capsule (500 mg total) by mouth 3 (three) times daily. For infection   AMOXICILLIN-CLAVULANATE (AUGMENTIN) 875-125 MG TABLET    Take 1 tablet by mouth 2 (two) times daily.   BLOOD GLUCOSE CALIBRATION (OT ULTRA/FASTTK CNTRL SOLN) SOLN    Use as directed for glucose meter calibration. Dx: E11.9   GABAPENTIN (NEURONTIN) 300 MG CAPSULE    Take 1 capsule (300 mg total) by mouth 2 (two) times daily.   RESPIRATORY THERAPY SUPPLIES (NEBULIZER/TUBING/MOUTHPIECE) KIT    Use every 6 hours as needed for wheezing and SOB with nebulizer machine    Review of Systems  Respiratory: Positive for shortness of breath.   Neurological: Positive for numbness.  Psychiatric/Behavioral: Positive for dysphoric mood and sleep disturbance. The patient is nervous/anxious.   All other systems reviewed and are negative.   Vitals:   09/19/16 1409  BP: 120/80  Pulse: 81  Temp: 98.5 F (36.9 C)  TempSrc: Oral  SpO2: 95%  Weight: 204 lb (92.5 kg)  Height: 5' 3"  (1.6 m)   Body mass index is 36.14 kg/m.  Physical Exam  Constitutional: She appears well-developed and well-nourished.  HENT:  Mouth/Throat: Oropharynx is clear and moist. No oropharyngeal exudate.  Eyes: Pupils are equal, round, and reactive to light. No scleral icterus.  Neck: Neck supple. Carotid bruit is not present. No tracheal deviation present. No thyromegaly present.  Cardiovascular: Normal rate, regular rhythm, normal heart sounds and intact distal pulses.  Exam reveals no gallop and no friction rub.   No murmur heard.  Trace LE edema b/l. no calf TTP.   Pulmonary/Chest: Effort normal and breath sounds normal. No stridor. No respiratory distress. She has no wheezes. She has no rales.  Reduced BS at base b/l  Abdominal: Soft. Bowel sounds are normal. She exhibits no distension and no mass. There is no  hepatomegaly. There is tenderness (epigastric). There is no rebound and no guarding.  Musculoskeletal: She exhibits edema and tenderness.       Back:  Lymphadenopathy:    She has no cervical adenopathy.  Neurological: She is alert.  Skin: Skin is warm and dry. No lesion and no rash noted.  Psychiatric: Her speech is normal and behavior is normal. Judgment and thought content normal. She exhibits a depressed mood.     Labs reviewed: No visits with results within 3 Month(s) from this visit.  Latest known visit with results is:  Office Visit on 05/21/2016  Component Date Value Ref Range Status  . Sodium 05/21/2016 142  135 - 146 mmol/L Final  . Potassium 05/21/2016 4.2  3.5 - 5.3 mmol/L Final  . Chloride 05/21/2016 105  98 - 110 mmol/L Final  . CO2 05/21/2016 26  20 - 31 mmol/L Final  . Glucose, Bld 05/21/2016 113* 65 - 99 mg/dL Final  . BUN 05/21/2016 18  7 - 25 mg/dL Final  . Creat 05/21/2016 0.82  0.50 - 0.99 mg/dL Final   Comment:   For patients > or = 61 years of age: The upper reference limit for Creatinine is approximately 13% higher for people identified as African-American.     . Calcium 05/21/2016 9.9  8.6 - 10.4 mg/dL Final  . GFR, Est African American 05/21/2016 89  >=60 mL/min Final  . GFR, Est Non African American 05/21/2016 77  >=60 mL/min Final  . ALT 05/21/2016 14  6 - 29 U/L Final  . TSH 05/21/2016 2.21  mIU/L Final   Comment:   Reference Range   > or = 20 Years  0.40-4.50   Pregnancy Range First trimester  0.26-2.66 Second trimester 0.55-2.73 Third trimester  0.43-2.91     . Cholesterol 05/21/2016 128  <200 mg/dL Final  . Triglycerides 05/21/2016 79  <150 mg/dL Final  . HDL 05/21/2016 50* >50 mg/dL Final  . Total CHOL/HDL Ratio 05/21/2016 2.6  <5.0 Ratio Final  . VLDL 05/21/2016 16  <30 mg/dL Final  . LDL Cholesterol 05/21/2016 62  <100 mg/dL Final  . Hgb A1c MFr Bld 05/21/2016 6.0* <5.7 % Final   Comment:   For someone without known diabetes, a  hemoglobin A1c value between 5.7% and 6.4% is consistent with prediabetes and should be confirmed with a follow-up test.   For someone with known diabetes, a value <7% indicates that their diabetes is well controlled. A1c targets should be individualized based on duration of diabetes, age, co-morbid conditions and other considerations.   This assay result is consistent with an increased risk of diabetes.   Currently, no consensus exists regarding use of hemoglobin A1c for diagnosis of diabetes in children.     . Mean Plasma Glucose 05/21/2016 126  mg/dL Final    No results found.   Assessment/Plan   ICD-10-CM   1. Neuropathy of hand, unspecified laterality G56.90 gabapentin (NEURONTIN) 300 MG capsule  2. COPD with asthma ([place]) J44.9   3. Grief reaction F43.20   4. Barrett's esophagus with dysplasia K22.719   5. Type 2 diabetes mellitus with diabetic neuropathy, without long-term current use of insulin (HCC) E11.40 BMP  with eGFR    ALT    Hemoglobin A1C  6. Hypothyroidism due to acquired atrophy of thyroid E03.4 TSH  7. Essential hypertension I10   8. Hyperlipidemia LDL goal <100 E78.5 Lipid Panel  9. Chronic low back pain, unspecified back pain laterality, with sciatica presence unspecified M54.5    G89.29    INCREASE GABAPENTIN 300MG take 2 caps 2 times daily  Continue other medications as ordered  Will call with lab results  Follow up in 3 mos for DM, neuropathy  Zionah Criswell S. Perlie Gold  [facility] [address] [place], [place] [address] [phone] Cell (Monday-Friday 8 AM - 5 PM) [phone] After 5 PM and follow prompts

## 2016-09-20 ENCOUNTER — Other Ambulatory Visit: Payer: Self-pay | Admitting: Nurse Practitioner

## 2016-09-20 ENCOUNTER — Other Ambulatory Visit: Payer: Self-pay | Admitting: Internal Medicine

## 2016-09-20 DIAGNOSIS — E119 Type 2 diabetes mellitus without complications: Secondary | ICD-10-CM

## 2016-09-20 LAB — BASIC METABOLIC PANEL WITH GFR
BUN: 19 mg/dL (ref 7–25)
CHLORIDE: 105 mmol/L (ref 98–110)
CO2: 27 mmol/L (ref 20–31)
CREATININE: 0.8 mg/dL (ref 0.50–0.99)
Calcium: 9.4 mg/dL (ref 8.6–10.4)
GFR, Est African American: >89 mL/min (ref >=60)
GFR, Est Non African American: 80 mL/min (ref >=60)
GLUCOSE: 92 mg/dL (ref 65–99)
POTASSIUM: 4.5 mmol/L (ref 3.5–5.3)
Sodium: 140 mmol/L (ref 135–146)

## 2016-09-20 LAB — LIPID PANEL
Cholesterol: 148 mg/dL (ref <200)
HDL: 49 mg/dL — ABNORMAL LOW (ref >50)
LDL CALC: 73 mg/dL (ref <100)
Total CHOL/HDL Ratio: 3 Ratio (ref <5.0)
Triglycerides: 130 mg/dL (ref <150)
VLDL: 26 mg/dL (ref <30)

## 2016-09-20 LAB — HEMOGLOBIN A1C
HEMOGLOBIN A1C: 6.1 % — AB (ref <5.7)
Mean Plasma Glucose: 128 mg/dL

## 2016-09-20 LAB — ALT: ALT: 15 U/L (ref 6–29)

## 2016-09-25 ENCOUNTER — Other Ambulatory Visit: Payer: Self-pay | Admitting: Internal Medicine

## 2016-09-25 DIAGNOSIS — F4321 Adjustment disorder with depressed mood: Secondary | ICD-10-CM

## 2016-11-07 ENCOUNTER — Other Ambulatory Visit: Payer: Self-pay | Admitting: Nurse Practitioner

## 2016-11-07 ENCOUNTER — Other Ambulatory Visit: Payer: Self-pay | Admitting: Internal Medicine

## 2016-11-07 DIAGNOSIS — G5690 Unspecified mononeuropathy of unspecified upper limb: Secondary | ICD-10-CM

## 2016-11-07 LAB — HM DIABETES EYE EXAM

## 2016-11-19 ENCOUNTER — Telehealth: Payer: Self-pay | Admitting: Internal Medicine

## 2016-11-19 NOTE — Telephone Encounter (Signed)
James from [employer] stopped by the office and dropped off a form for an Oximetry test for Dr. Eulas Post to review for patient.  Attached last office note and put in Dr. Saralyn Pilar box to review

## 2016-12-02 ENCOUNTER — Encounter: Payer: Self-pay | Admitting: Internal Medicine

## 2016-12-04 DIAGNOSIS — J449 Chronic obstructive pulmonary disease, unspecified: Secondary | ICD-10-CM | POA: Diagnosis not present

## 2016-12-17 ENCOUNTER — Telehealth: Payer: Self-pay | Admitting: *Deleted

## 2016-12-17 DIAGNOSIS — R0902 Hypoxemia: Secondary | ICD-10-CM

## 2016-12-17 NOTE — Telephone Encounter (Signed)
Received Pulse Oximetry from [employer]) [phone].  Per Dr. Wyonia Hough requires nocturnal NCO2 @24min . 2hour Hypoxia while sleeping. Recommend formal sleep study if not done recently.   LMOM for patient to return call.  Faxed Order back to [employer]

## 2016-12-17 NOTE — Telephone Encounter (Signed)
Patient called back. Results given. Never had a Sleep Study done. Referral placed.

## 2016-12-23 ENCOUNTER — Encounter: Payer: Self-pay | Admitting: Internal Medicine

## 2016-12-23 ENCOUNTER — Ambulatory Visit (INDEPENDENT_AMBULATORY_CARE_PROVIDER_SITE_OTHER): Payer: Medicare Other | Admitting: Internal Medicine

## 2016-12-23 VITALS — BP 142/94 | HR 80 | Temp 98.3°F | Ht 63.0 in | Wt 203.0 lb

## 2016-12-23 DIAGNOSIS — E1140 Type 2 diabetes mellitus with diabetic neuropathy, unspecified: Secondary | ICD-10-CM | POA: Diagnosis not present

## 2016-12-23 DIAGNOSIS — E785 Hyperlipidemia, unspecified: Secondary | ICD-10-CM

## 2016-12-23 DIAGNOSIS — K22719 Barrett's esophagus with dysplasia, unspecified: Secondary | ICD-10-CM | POA: Diagnosis not present

## 2016-12-23 DIAGNOSIS — Z23 Encounter for immunization: Secondary | ICD-10-CM

## 2016-12-23 DIAGNOSIS — J449 Chronic obstructive pulmonary disease, unspecified: Secondary | ICD-10-CM | POA: Diagnosis not present

## 2016-12-23 DIAGNOSIS — Z9189 Other specified personal risk factors, not elsewhere classified: Secondary | ICD-10-CM | POA: Diagnosis not present

## 2016-12-23 DIAGNOSIS — E034 Atrophy of thyroid (acquired): Secondary | ICD-10-CM

## 2016-12-23 DIAGNOSIS — F4320 Adjustment disorder, unspecified: Secondary | ICD-10-CM

## 2016-12-23 DIAGNOSIS — E1169 Type 2 diabetes mellitus with other specified complication: Secondary | ICD-10-CM | POA: Diagnosis not present

## 2016-12-23 DIAGNOSIS — I10 Essential (primary) hypertension: Secondary | ICD-10-CM

## 2016-12-23 DIAGNOSIS — J4489 Other specified chronic obstructive pulmonary disease: Secondary | ICD-10-CM

## 2016-12-23 DIAGNOSIS — Z1159 Encounter for screening for other viral diseases: Secondary | ICD-10-CM | POA: Diagnosis not present

## 2016-12-23 DIAGNOSIS — F4321 Adjustment disorder with depressed mood: Secondary | ICD-10-CM

## 2016-12-23 NOTE — Patient Instructions (Addendum)
Continue current medications as ordered  Flu shot given today  Follow up with specialists as scheduled  Will call with lab results  Follow up in 3 mos for grief reaction, DM, HTN

## 2016-12-23 NOTE — Progress Notes (Signed)
Patient ID: Grace [name], female   DOB: 01-13-1956, 61 y.o.   MRN: [id number]     Location:  [facility] Place of Service: OFFICE  Chief Complaint  Patient presents with  . Medical Management of Chronic Issues    3 month follow-up, DM foot exam due   . Immunizations    Flu Vaccine today   . Health Maintenance    Refuse Hep C and HIV Screening. Refused colonoscopy.     HPI:  61 yo female seen today for f/u. She reports increased stressors at home. A tree is lying on her home x 6-8 weeks and she cannot pay anyone to take the tree away. No obvious damage to her home. She also is unable to pay her bills at home.  grief reaction - over mother's death in 12-31-2014. (+) insomnia and wakes up every hr. No relief with lorazepam but she still rakes it. Takes melatonin 73m. She does not take trazodone for insomnia as it was ineffective. lorazepam helps better. No SI/HI. She is no longer seeing counselor through hospice. She admits that she is not coping well.  COPD/asthma hx - no recent exacerbations. She gets SOB with mild exertion. She takes flovent, proair. [facility]. Anora caused chest pain. utibron helps but it is very expensive. She needs samples  Hypothyroid - TSH 0.81; Free T4 level 1. Takes Levothyroxine 150 mcg daily. (+) hot intolerance. She reports improved nails but still has issues with thinning hair.  Tobacco abuse - she stopped smoking but is now eating more. Weight 196-->173-->175-->190-->198-->203 lbs since June 2017  HTN - BP fluctuating at home 130s/80s usually and occasional SBP 160s. Her lisinopril was stopped due to low BPs. She takes metoprolol  DM - A1c 6.1%. She takes glipizide. No low BS reactions. Urine microalbumin/Cr ratio 93 (down from 147.5). BS 100s. Rarely >150  Hyperlipidemia - stable on lipitor. LDL 73  Protein calorie malnutrition - stable. Albumin 3.6  Peripheral neuropathy - due to DM. unchanged. She stopped  gabapenti  GERD - takes low dose omeprazole. She occasionally has epigastric pain that awakens from sleep and causes nausea/vomiting. Pain resolves after emesis. She has a hx barrett's esophagus and has not seen GI in > 1 yr. No hematemesis. She takes "liquid" acid indigestion" med that helps.  Past Medical History:  Diagnosis Date  . Abnormal vaginal bleeding   . Asthma   . Barrett's esophagus    with high grade dysplasia per endoscopy 05/2005 // followed by Dr. CSilvano Rusk([facility])  . Chronic back pain   . COPD (chronic obstructive pulmonary disease) ([place]   . Diabetes mellitus [place] complication [place]   . Duodenal ulcer disease    thought to be contributed at least partly by overuse of NSAIDs and salicylates (goody powdr)  . Endometrial polyp 03/2003   s/p resection in 03/2003. Path showing submucosal leiomyoma and benign  proliferative type endometrium  . Fibroid uterus    s/p myomectomy x 2  . GERD (gastroesophageal reflux disease)   . H/O failed moderate sedation   . Hiatal hernia   . History of CVA (cerebrovascular accident) 10/2000  . 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 Hyperlipidemia   . Hypertension   . Hypothyroidism    with hx of multinodular goiter (noted on neck UKorea  in 11/2002 - showing diffuse nodularity and inhomogenous texture diffusely BL)  . Internal hemorrhoids    grade 2 per colonoscopy in 09/2006 - repeat colonoscopy rec in 5-10 years.  . Migraine   . Nevus   . Personal history of failed conscious sedation 08/11/2011  . Tachycardia   . Thyroid disease   . Tobacco abuse Jan 2014   quit    Past Surgical History:  Procedure Laterality Date  . CHOLECYSTECTOMY    . COLONOSCOPY  02/07/2010  . HYSTEROSCOPY    . LAPAROSCOPIC APPENDECTOMY N/A 09/23/2015   Procedure: APPENDECTOMY LAPAROSCOPIC;  Surgeon: Georganna Skeans, MD;  Location: [facility];  Service: General;   Laterality: N/A;  . [place]   x 2 - In 1997, noted to have extensive pelvic adhesions and BL tubal obstruction  . UPPER GASTROINTESTINAL ENDOSCOPY  12/18/2009    Patient Care Team: Gildardo Cranker, DO as PCP - General (Internal Medicine)  Social History   Social History  . Marital status: Divorced    Spouse name: N/A  . Number of children: N/A  . Years of education: N/A   Occupational History  . Not on file.   Social History Main Topics  . Smoking status: Former Smoker    Packs/day: 1.00    Years: 42.00    Types: Cigarettes    Quit date: 05/10/2015  . Smokeless tobacco: Never Used     Comment: Counseling sheet [id number]   . Alcohol use No  . Drug use: No  . Sexual activity: No   Other Topics Concern  . Not on file   Social History Narrative         Financial assistance approved for 100% discount at [facility] and has [facility] card per Bonna Gains   01/15/2010      Diet- N/A   Caffeine- Goody powders   Married-Divorced   [employer] and lives with mother   Pets-2   Current/past profession-[employer]   Exercise-No   Living will-No   DNR-No   POA/HPOA-No              reports that she quit smoking about 19 months ago. Her smoking use included Cigarettes. She has a 42.00 pack-year smoking history. She has never used smokeless tobacco. She reports that she does not drink alcohol or use drugs.  Family History  Problem Relation Age of Onset  . COPD Mother   . Heart disease Father        had 5 heart attacks, first at age 72s.  Marland Kitchen COPD Father    Family Status  Relation Status  . Mother Deceased  . Father Deceased at age 58     Allergies  Allergen Reactions  . Anoro Ellipta [Umeclidinium-Vilanterol] Other (See Comments)    Chest pain if used more than 3 consecutive days   . Codeine     REACTION: headaches  . Prozac [Fluoxetine Hcl] Other (See Comments)    Suicidal ideations    Medications: Patient's Medications  New Prescriptions   No medications on  file  Previous Medications   ALBUTEROL (PROAIR HFA) 108 (90 BASE) MCG/ACT INHALER    Inhale two puffs into lungs every 6 hours as needed for wheezing or for SOB   ASPIRIN-ACETAMINOPHEN-CAFFEINE 500-325-65 MG PACK    Take 1 Package by mouth as needed.    ATORVASTATIN (LIPITOR) 80 MG TABLET    TAKE ONE TABLET BY MOUTH  ONCE DAILY AT BEDTIME FOR  CHOLESTEROL   FLOVENT HFA 44 MCG/ACT  INHALER    INHALE TWO PUFFS INTO LUNGS TWICE DAILY   GLIPIZIDE (GLUCOTROL XL) 5 MG 24 HR TABLET    Take 1 tablet (5 mg total) by mouth daily with breakfast.   GLUCOSAMINE-CHONDROITIN-MSM PO    Take 3 tablets by mouth daily.   IBUPROFEN (ADVIL,MOTRIN) 200 MG TABLET    Take 600 mg by mouth as needed for moderate pain.    INDACATEROL-GLYCOPYRROLATE (UTIBRON NEOHALER) 27.5-15.6 MCG CAPS    Place 1 capsule into inhaler and inhale 2 (two) times daily.   IPRATROPIUM-ALBUTEROL (DUONEB) 0.5-2.5 (3) MG/3ML SOLN    Take 3 mLs by nebulization every 6 (six) hours as needed. For shortness of breath or wheezing   KETOTIFEN FUMARATE (REFRESH EYE ITCH RELIEF OP)    Place 1 drop into both eyes daily as needed. For itchy eyes   LEVOTHYROXINE (SYNTHROID, LEVOTHROID) 150 MCG TABLET    Take one tablet by mouth 30 minutes before breakfast once daily for thyroid   LISINOPRIL (PRINIVIL,ZESTRIL) 10 MG TABLET    TAKE 1/2 TABLET BY MOUTH  ONCE DAILY FOR BLOOD  PRESSURE   LORAZEPAM (ATIVAN) 0.5 MG TABLET    Take one tablet by mouth three times daily as needed for anxiety or nervousness   METOPROLOL TARTRATE (LOPRESSOR) 25 MG TABLET    Take 0.5 tablets (12.5 mg total) by mouth 2 (two) times daily.   MULTIPLE VITAMINS-MINERALS (HAIR SKIN AND NAILS FORMULA PO)    Take 2 tablets by mouth daily.   OMEPRAZOLE (PRILOSEC) 20 MG CAPSULE    TAKE 1 CAPSULE BY MOUTH  DAILY   ONETOUCH DELICA LANCETS 94H MISC    USE TWO TIMES DAILY   ONETOUCH VERIO TEST STRIP    CHECK BLOOD SUGAR TWO TIMES DAILY AND AS DIRECTED   TRAZODONE (DESYREL) 150 MG TABLET    TAKE 1 TABLET  BY MOUTH AT  BEDTIME   TURMERIC PO    Take 1 capsule by mouth daily.  Modified Medications   No medications on file  Discontinued Medications   GABAPENTIN (NEURONTIN) 300 MG CAPSULE    TAKE 2 CAPSULES BY MOUTH  TWO TIMES DAILY    Review of Systems  Respiratory: Positive for shortness of breath.   Musculoskeletal: Positive for arthralgias.  Psychiatric/Behavioral: Positive for dysphoric mood and sleep disturbance. The patient is nervous/anxious.   All other systems reviewed and are negative.   Vitals:   12/23/16 1123  BP: (!) 142/94  Pulse: 80  Temp: 98.3 F (36.8 C)  TempSrc: Oral  SpO2: 93%  Weight: 203 lb (92.1 kg)  Height: 5' 3"  (1.6 m)   Body mass index is 35.96 kg/m.  Physical Exam  Constitutional: She is oriented to person, place, and time. She appears well-developed and well-nourished.  HENT:  Mouth/Throat: Oropharynx is clear and moist. No oropharyngeal exudate.  MMM; no oral thrush  Eyes: Pupils are equal, round, and reactive to light. No scleral icterus.  Neck: Neck supple. Carotid bruit is not present. No tracheal deviation present. No thyromegaly present.  Cardiovascular: Normal rate, regular rhythm and intact distal pulses.  Exam reveals no gallop and no friction rub.   Murmur (1/6 SEM) heard. No LE edema b/l. no calf TTP.   Pulmonary/Chest: Effort normal and breath sounds normal. No stridor. No respiratory distress. She has no wheezes. She has no rales.  Abdominal: Soft. Normal appearance and bowel sounds are normal. She exhibits distension. She exhibits no mass. There is no hepatomegaly. There is tenderness. There is no rigidity,  no rebound and no guarding. No hernia.  Musculoskeletal: She exhibits edema. She exhibits no tenderness.  Lymphadenopathy:    She has no cervical adenopathy.  Neurological: She is alert and oriented to person, place, and time.  Skin: Skin is warm and dry. No rash noted.  Psychiatric: She has a normal mood and affect. Her behavior  is normal. Judgment and thought content normal.   Diabetic Foot Exam - Simple   Simple Foot Form Diabetic Foot exam was performed with the following findings:  Yes 12/23/2016 12:06 PM  Visual Inspection See comments:  Yes Sensation Testing Intact to touch and monofilament testing bilaterally:  Yes Pulse Check Posterior Tibialis and Dorsalis pulse intact bilaterally:  Yes Comments B/l large bunions but no calluses/ulcerations       Labs reviewed: No visits with results within 3 Month(s) from this visit.  Latest known visit with results is:  Office Visit on 09/19/2016  Component Date Value Ref Range Status  . Sodium 09/19/2016 140  135 - 146 mmol/L Final  . Potassium 09/19/2016 4.5  3.5 - 5.3 mmol/L Final  . Chloride 09/19/2016 105  98 - 110 mmol/L Final  . CO2 09/19/2016 27  20 - 31 mmol/L Final  . Glucose, Bld 09/19/2016 92  65 - 99 mg/dL Final  . BUN 09/19/2016 19  7 - 25 mg/dL Final  . Creat 09/19/2016 0.80  0.50 - 0.99 mg/dL Final   Comment:   For patients > or = 61 years of age: The upper reference limit for Creatinine is approximately 13% higher for people identified as African-American.     . Calcium 09/19/2016 9.4  8.6 - 10.4 mg/dL Final  . GFR, Est African American 09/19/2016 >89  >=60 mL/min Final  . GFR, Est Non African American 09/19/2016 80  >=60 mL/min Final  . ALT 09/19/2016 15  6 - 29 U/L Final  . TSH 09/19/2016 0.81  mIU/L Final   Comment:   Reference Range   > or = 20 Years  0.40-4.50   Pregnancy Range First trimester  0.26-2.66 Second trimester 0.55-2.73 Third trimester  0.43-2.91     . Cholesterol 09/19/2016 148  <200 mg/dL Final  . Triglycerides 09/19/2016 130  <150 mg/dL Final  . HDL 09/19/2016 49* >50 mg/dL Final  . Total CHOL/HDL Ratio 09/19/2016 3.0  <5.0 Ratio Final  . VLDL 09/19/2016 26  <30 mg/dL Final  . LDL Cholesterol 09/19/2016 73  <100 mg/dL Final  . Hgb A1c MFr Bld 09/19/2016 6.1* <5.7 % Final   Comment:   For someone without  known diabetes, a hemoglobin A1c value between 5.7% and 6.4% is consistent with prediabetes and should be confirmed with a follow-up test.   For someone with known diabetes, a value <7% indicates that their diabetes is well controlled. A1c targets should be individualized based on duration of diabetes, age, co-morbid conditions and other considerations.   This assay result is consistent with an increased risk of diabetes.   Currently, no consensus exists regarding use of hemoglobin A1c for diagnosis of diabetes in children.     . Mean Plasma Glucose 09/19/2016 128  mg/dL Final    No results found.   Assessment/Plan   ICD-10-CM   1. Type 2 diabetes mellitus with diabetic neuropathy, without long-term current use of insulin (HCC) E11.40 CMP with eGFR    Lipid Panel    Hemoglobin A1c    Microalbumin/Creatinine Ratio, Urine  2. Barrett's esophagus with dysplasia K22.719   3. COPD with  asthma ([place]) J44.9   4. Hypothyroidism due to acquired atrophy of thyroid E03.4 TSH  5. Essential hypertension I10   6. Hyperlipidemia LDL goal <100 E78.5   7. Grief reaction F43.20   8. Hyperlipidemia associated with type 2 diabetes mellitus (HCC) E11.69 Lipid Panel   E78.5   9. Encounter for hepatitis C virus screening test for high risk patient Z11.59 Hep C Antibody   Z91.89   10. Need for immunization against influenza Z23 Flu Vaccine QUAD 36+ mos IM     Samples of utibron given  Continue current medications as ordered  Flu shot given today  Follow up with specialists as scheduled  Will call with lab results  Follow up in 3 mos for grief reaction, DM, HTN  Idus Rathke S. Perlie Gold  [facility] [address] [place],  [address] [phone] Cell (Monday-Friday 8 AM - 5 PM) [phone] After 5 PM and follow prompts

## 2016-12-24 LAB — COMPLETE METABOLIC PANEL WITH GFR
AG Ratio: 1.3 (calc) (ref 1.0–2.5)
ALBUMIN MSPROF: 3.9 g/dL (ref 3.6–5.1)
ALKALINE PHOSPHATASE (APISO): 92 U/L (ref 33–130)
ALT: 21 U/L (ref 6–29)
AST: 20 U/L (ref 10–35)
BILIRUBIN TOTAL: 0.3 mg/dL (ref 0.2–1.2)
BUN: 10 mg/dL (ref 7–25)
CHLORIDE: 105 mmol/L (ref 98–110)
CO2: 28 mmol/L (ref 20–32)
CREATININE: 0.87 mg/dL (ref 0.50–0.99)
Calcium: 9.2 mg/dL (ref 8.6–10.4)
GFR, EST AFRICAN AMERICAN: 83 mL/min/{1.73_m2} (ref >=60)
GFR, Est Non African American: 72 mL/min/{1.73_m2} (ref >=60)
GLUCOSE: 96 mg/dL (ref 65–139)
Globulin: 2.9 g/dL (calc) (ref 1.9–3.7)
Potassium: 4 mmol/L (ref 3.5–5.3)
Sodium: 140 mmol/L (ref 135–146)
TOTAL PROTEIN: 6.8 g/dL (ref 6.1–8.1)

## 2016-12-24 LAB — HEMOGLOBIN A1C
Hgb A1c MFr Bld: 6.1 % of total Hgb — ABNORMAL HIGH (ref <5.7)
Mean Plasma Glucose: 128 (calc)
eAG (mmol/L): 7.1 (calc)

## 2016-12-24 LAB — LIPID PANEL
CHOL/HDL RATIO: 2.5 (calc) (ref <5.0)
CHOLESTEROL: 117 mg/dL (ref <200)
HDL: 46 mg/dL — ABNORMAL LOW (ref >50)
LDL CHOLESTEROL (CALC): 53 mg/dL
Non-HDL Cholesterol (Calc): 71 mg/dL (calc) (ref <130)
TRIGLYCERIDES: 93 mg/dL (ref <150)

## 2016-12-24 LAB — MICROALBUMIN / CREATININE URINE RATIO
CREATININE, URINE: 209 mg/dL (ref 20–275)
MICROALB UR: 18.3 mg/dL
Microalb Creat Ratio: 88 mcg/mg creat — ABNORMAL HIGH (ref <30)

## 2016-12-24 LAB — HEPATITIS C ANTIBODY
HEP C AB: NONREACTIVE
SIGNAL TO CUT-OFF: 0.02 (ref <1.00)

## 2016-12-24 LAB — TSH: TSH: 0.47 mIU/L (ref 0.40–4.50)

## 2017-01-20 ENCOUNTER — Institutional Professional Consult (permissible substitution): Payer: Medicare Other | Admitting: Neurology

## 2017-02-05 ENCOUNTER — Ambulatory Visit (INDEPENDENT_AMBULATORY_CARE_PROVIDER_SITE_OTHER): Payer: Medicare Other

## 2017-02-05 VITALS — BP 144/78 | HR 82 | Temp 97.8°F | Ht 63.0 in | Wt 213.0 lb

## 2017-02-05 DIAGNOSIS — Z0000 Encounter for general adult medical examination without abnormal findings: Secondary | ICD-10-CM

## 2017-02-05 NOTE — Progress Notes (Signed)
Subjective:   Grace [name] is a 61 y.o. female who presents for Medicare Annual (Subsequent) preventive examination.  Last AWV-02/15/2016    Objective:     Vitals: BP (!) 144/78 (BP Location: Right Arm, Patient Position: Sitting)   Pulse 82   Temp 97.8 F (36.6 C) (Oral)   Ht 5\' 3"  (1.6 m)   Wt 213 lb (96.6 kg)   LMP 02/27/2014   SpO2 95%   BMI 37.73 kg/m   Body mass index is 37.73 kg/m.   Tobacco History  Smoking Status  . Former Smoker  . Packs/day: 1.00  . Years: 42.00  . Types: Cigarettes  . Quit date: 05/10/2015  Smokeless Tobacco  . Never Used    Comment: Counseling sheet 07-2011      Counseling given: Not Answered   Past Medical History:  Diagnosis Date  . Abnormal vaginal bleeding   . Asthma   . Barrett's esophagus    with high grade dysplasia per endoscopy 05/2005 // followed by Dr. [name] ([facility])  . Chronic back pain   . COPD (chronic obstructive pulmonary disease) ([place])   . Diabetes mellitus without complication ([place])   . Duodenal ulcer disease    thought to be contributed at least partly by overuse of NSAIDs and salicylates (goody powdr)  . Endometrial polyp 03/2003   s/p resection in 03/2003. Path showing submucosal leiomyoma and benign  proliferative type endometrium  . Fibroid uterus    s/p myomectomy x 2  . GERD (gastroesophageal reflux disease)   . H/O failed moderate sedation   . Hiatal hernia   . History of CVA (cerebrovascular accident) 10/2000  . 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 Hyperlipidemia   . Hypertension   . Hypothyroidism    with hx of multinodular goiter (noted on neck US in 11/2002 - showing diffuse nodularity and inhomogenous texture diffusely BL)  . Internal hemorrhoids    grade 2 per colonoscopy in 09/2006 - repeat colonoscopy rec in 5-10 years.  . Migraine   . Nevus   . Personal history of failed conscious  sedation 08/11/2011  . Tachycardia   . Thyroid disease   . Tobacco abuse Jan 2014   quit   Past Surgical History:  Procedure Laterality Date  . CHOLECYSTECTOMY    . COLONOSCOPY  02/07/2010  . HYSTEROSCOPY    . LAPAROSCOPIC APPENDECTOMY N/A 09/23/2015   Procedure: APPENDECTOMY LAPAROSCOPIC;  Surgeon: Georganna Skeans, MD;  Location: [facility];  Service: General;  Laterality: N/A;  . [place]   x 2 - In 1997, noted to have extensive pelvic adhesions and BL tubal obstruction  . UPPER GASTROINTESTINAL ENDOSCOPY  12/18/2009   Family History  Problem Relation Age of Onset  . COPD Mother   . Heart disease Father        had 5 heart attacks, first at age 43s.  Marland Kitchen COPD Father    History  Sexual Activity  . Sexual activity: No    Outpatient Encounter Prescriptions as of 02/05/2017  Medication Sig  . albuterol (PROAIR HFA) 108 (90 Base) MCG/ACT inhaler Inhale two puffs into lungs every 6 hours as needed for wheezing or for SOB  . Aspirin-Acetaminophen-Caffeine 500-325-65 MG PACK Take 1 Package by mouth as needed.   Marland Kitchen atorvastatin (LIPITOR) 80 MG tablet TAKE ONE TABLET BY MOUTH  ONCE  DAILY AT BEDTIME FOR  CHOLESTEROL  . glipiZIDE (GLUCOTROL XL) 5 MG 24 hr tablet Take 1 tablet (5 mg total) by mouth daily with breakfast.  . GLUCOSAMINE-CHONDROITIN-MSM PO Take 3 tablets by mouth daily.  Marland Kitchen ibuprofen (ADVIL,MOTRIN) 200 MG tablet Take 600 mg by mouth as needed for moderate pain.   . Indacaterol-Glycopyrrolate (UTIBRON NEOHALER) 27.5-15.6 MCG CAPS Place 1 capsule into inhaler and inhale 2 (two) times daily.  Marland Kitchen ipratropium-albuterol (DUONEB) 0.5-2.5 (3) MG/3ML SOLN Take 3 mLs by nebulization every 6 (six) hours as needed. For shortness of breath or wheezing  . Ketotifen Fumarate (REFRESH EYE ITCH RELIEF OP) Place 1 drop into both eyes daily as needed. For itchy eyes  . levothyroxine (SYNTHROID, LEVOTHROID) 150 MCG tablet Take one tablet by mouth 30 minutes before breakfast once daily for  thyroid  . lisinopril (PRINIVIL,ZESTRIL) 10 MG tablet TAKE 1/2 TABLET BY MOUTH  ONCE DAILY FOR BLOOD  PRESSURE  . LORazepam (ATIVAN) 0.5 MG tablet Take one tablet by mouth three times daily as needed for anxiety or nervousness  . metoprolol tartrate (LOPRESSOR) 25 MG tablet Take 0.5 tablets (12.5 mg total) by mouth 2 (two) times daily.  Marland Kitchen omeprazole (PRILOSEC) 20 MG capsule TAKE 1 CAPSULE BY MOUTH  DAILY  . ONETOUCH DELICA LANCETS 28B MISC USE TWO TIMES DAILY  . ONETOUCH VERIO test strip CHECK BLOOD SUGAR TWO TIMES DAILY AND AS DIRECTED  . [DISCONTINUED] FLOVENT HFA 44 MCG/ACT inhaler INHALE TWO PUFFS INTO LUNGS TWICE DAILY  . [DISCONTINUED] Multiple Vitamins-Minerals (HAIR SKIN AND NAILS FORMULA PO) Take 2 tablets by mouth daily.  . [DISCONTINUED] traZODone (DESYREL) 150 MG tablet TAKE 1 TABLET BY MOUTH AT  BEDTIME  . [DISCONTINUED] TURMERIC PO Take 1 capsule by mouth daily.   No facility-administered encounter medications on file as of 02/05/2017.     Activities of Daily Living In your present state of health, do you have any difficulty performing the following activities: 02/05/2017 02/15/2016  Hearing? N N  Vision? Y N  Difficulty concentrating or making decisions? Y Y  Comment - States she has trouble concentrating on tasks, gets easily distracted.   Walking or climbing stairs? N Y  Comment - Back pain and SOB  Dressing or bathing? N N  Doing errands, shopping? N N  Comment - Pt still driving vehicle.   Preparing Food and eating ? N N  Using the Toilet? N N  In the past six months, have you accidently leaked urine? Y Y  Comment - Recently starting having some leakage, about every other day. Wears pad.  Do you have problems with loss of bowel control? N N  Managing your Medications? N N  Managing your Finances? N N  Housekeeping or managing your Housekeeping? N N  Some recent data might be hidden    Patient Care Team: Gildardo Cranker, DO as PCP - General (Internal Medicine)     Assessment:      Exercise Activities and Dietary recommendations Current Exercise Habits: The patient does not participate in regular exercise at present, Exercise limited by: None identified  Goals    . Blood Pressure < 140/90    . Exercise 3x per week (30 min per time)          Starting 02/15/16, I will attempt to start exercising 3 times per week, at home.     . Increase physical activity          Patient will call silver sneakers and try to exercise  throughout the week    . LDL CALC < 100    . Quit smoking / using tobacco      Fall Risk Fall Risk  02/05/2017 12/23/2016 09/19/2016 05/29/2016 05/21/2016  Falls in the past year? No No No No No  Number falls in past yr: - - - - -  Injury with Fall? - - - - -   Depression Screen PHQ 2/9 Scores 02/05/2017 09/19/2016 02/15/2016 09/05/2015  PHQ - 2 Score 5 0 6 0  PHQ- 9 Score 14 - 22 -     Cognitive Function: within normal limits MMSE - Mini Mental State Exam 02/15/2016  Orientation to time 5  Orientation to Place 5  Registration 3  Attention/ Calculation 5  Recall 3  Language- name 2 objects 2  Language- repeat 1  Language- follow 3 step command 3  Language- read & follow direction 1  Write a sentence 1  Copy design 1  Total score 30        Immunization History  Administered Date(s) Administered  . Influenza Split 04/25/2011  . Influenza Whole 02/06/2009  . Influenza, Seasonal, Injecte, Preservative Fre 05/20/2012  . Influenza,inj,Quad PF,6+ Mos 03/15/2013, 01/12/2015, 01/25/2016, 12/23/2016  . Influenza-Unspecified 12/13/2013  . Pneumococcal Polysaccharide-23 05/20/2012  . Tdap 06/12/2011  . Zoster 12/16/2015   Screening Tests Health Maintenance  Topic Date Due  . OPHTHALMOLOGY EXAM  04/14/2017 (Originally 03/19/2016)  . COLONOSCOPY  04/14/2017 (Originally 10/07/2016)  . HIV Screening  04/14/2017 (Originally 05/10/1970)  . PNEUMOCOCCAL POLYSACCHARIDE VACCINE (2) 05/20/2017  . HEMOGLOBIN A1C  06/22/2017  . PAP  SMEAR  07/30/2017  . MAMMOGRAM  11/20/2017  . FOOT EXAM  12/23/2017  . TETANUS/TDAP  06/11/2021  . INFLUENZA VACCINE  Completed  . Hepatitis C Screening  Completed      Plan:    I have personally reviewed and addressed the Medicare Annual Wellness questionnaire and have noted the following in the patient's chart:  A. Medical and social history B. Use of alcohol, tobacco or illicit drugs  C. Current medications and supplements D. Functional ability and status E.  Nutritional status F.  Physical activity G. Advance directives H. List of other physicians I.  Hospitalizations, surgeries, and ER visits in previous 12 months J.  [place] to include hearing, vision, cognitive, depression L. Referrals and appointments - none  In addition, I have reviewed and discussed with patient certain preventive protocols, quality metrics, and best practice recommendations. A written personalized care plan for preventive services as well as general preventive health recommendations were provided to patient.  See attached scanned questionnaire for additional information.   Signed,   Rich Reining, RN Nurse Health Advisor   Quick Notes   Health Maintenance: Cologuard paperwork filled out     Abnormal Screen: PHQ-9:14     Patient Concerns: None     Nurse Concerns: None

## 2017-02-05 NOTE — Patient Instructions (Addendum)
Grace [name] , Thank you for taking time to come for your Medicare Wellness Visit. I appreciate your ongoing commitment to your health goals. Please review the following plan we discussed and let me know if I can assist you in the future.   Screening recommendations/referrals: Colonoscopy due, colorguard paperwork given for you to fill out Mammogram up to date. Due 11/21/2017 Bone Density due Recommended yearly ophthalmology/optometry visit for glaucoma screening and checkup Recommended yearly dental visit for hygiene and checkup  Vaccinations: Influenza vaccine up to date. Due 2019 fall season Pneumococcal vaccine 13 due at age 5  Tdap vaccine up to date. Due 06/11/2021 Shingles vaccine due at age 62  Advanced directives: Advance directive discussed with you today. I have provided a copy for you to complete at home and have notarized. Once this is complete please bring a copy in to our office so we can scan it into your chart.  Conditions/risks identified: None  Next appointment: Dr. Eulas Post 03/24/17 @ 11:30am  Preventive Care 40-64 Years, Female Preventive care refers to lifestyle choices and visits with your health care provider that can promote health and wellness. What does preventive care include?  A yearly physical exam. This is also called an annual well check.  Dental exams once or twice a year.  Routine eye exams. Ask your health care provider how often you should have your eyes checked.  Personal lifestyle choices, including:  Daily care of your teeth and gums.  Regular physical activity.  Eating a healthy diet.  Avoiding tobacco and drug use.  Limiting alcohol use.  Practicing safe sex.  Taking low-dose aspirin daily starting at age 65.  Taking vitamin and mineral supplements as recommended by your health care provider. What happens during an annual well check? The services and screenings done by your health care provider during your annual well check will  depend on your age, overall health, lifestyle risk factors, and family history of disease. Counseling  Your health care provider may ask you questions about your:  Alcohol use.  Tobacco use.  Drug use.  Emotional well-being.  Home and relationship well-being.  Sexual activity.  Eating habits.  Work and work Statistician.  Method of birth control.  Menstrual cycle.  Pregnancy history. Screening  You may have the following tests or measurements:  Height, weight, and BMI.  Blood pressure.  Lipid and cholesterol levels. These may be checked every 5 years, or more frequently if you are over 66 years old.  Skin check.  Lung cancer screening. You may have this screening every year starting at age 36 if you have a 30-pack-year history of smoking and currently smoke or have quit within the past 15 years.  Fecal occult blood test (FOBT) of the stool. You may have this test every year starting at age 64.  Flexible sigmoidoscopy or colonoscopy. You may have a sigmoidoscopy every 5 years or a colonoscopy every 10 years starting at age 62.  Hepatitis C blood test.  Hepatitis B blood test.  Sexually transmitted disease (STD) testing.  Diabetes screening. This is done by checking your blood sugar (glucose) after you have not eaten for a while (fasting). You may have this done every 1-3 years.  Mammogram. This may be done every 1-2 years. Talk to your health care provider about when you should start having regular mammograms. This may depend on whether you have a family history of breast cancer.  BRCA-related cancer screening. This may be done if you have a family history  of breast, ovarian, tubal, or peritoneal cancers.  Pelvic exam and Pap test. This may be done every 3 years starting at age 4. Starting at age 20, this may be done every 5 years if you have a Pap test in combination with an HPV test.  Bone density scan. This is done to screen for osteoporosis. You may have  this scan if you are at high risk for osteoporosis. Discuss your test results, treatment options, and if necessary, the need for more tests with your health care provider. Vaccines  Your health care provider may recommend certain vaccines, such as:  Influenza vaccine. This is recommended every year.  Tetanus, diphtheria, and acellular pertussis (Tdap, Td) vaccine. You may need a Td booster every 10 years.  Zoster vaccine. You may need this after age 42.  Pneumococcal 13-valent conjugate (PCV13) vaccine. You may need this if you have certain conditions and were not previously vaccinated.  Pneumococcal polysaccharide (PPSV23) vaccine. You may need one or two doses if you smoke cigarettes or if you have certain conditions. Talk to your health care provider about which screenings and vaccines you need and how often you need them. This information is not intended to replace advice given to you by your health care provider. Make sure you discuss any questions you have with your health care provider. Document Released: 04/27/2015 Document Revised: 12/19/2015 Document Reviewed: 01/30/2015 Elsevier Interactive Patient Education  2017 [employer] Prevention in the Home Falls can cause injuries. They can happen to people of all ages. There are many things you can do to make your home safe and to help prevent falls. What can I do on the outside of my home?  Regularly fix the edges of walkways and driveways and fix any cracks.  Remove anything that might make you trip as you walk through a door, such as a raised step or threshold.  Trim any bushes or trees on the path to your home.  Use bright outdoor lighting.  Clear any walking paths of anything that might make someone trip, such as rocks or tools.  Regularly check to see if handrails are loose or broken. Make sure that both sides of any steps have handrails.  Any raised decks and porches should have guardrails on the  edges.  Have any leaves, snow, or ice cleared regularly.  Use sand or salt on walking paths during winter.  Clean up any spills in your garage right away. This includes oil or grease spills. What can I do in the bathroom?  Use night lights.  Install grab bars by the toilet and in the tub and shower. Do not use towel bars as grab bars.  Use non-skid mats or decals in the tub or shower.  If you need to sit down in the shower, use a plastic, non-slip stool.  Keep the floor dry. Clean up any water that spills on the floor as soon as it happens.  Remove soap buildup in the tub or shower regularly.  Attach bath mats securely with double-sided non-slip rug tape.  Do not have throw rugs and other things on the floor that can make you trip. What can I do in the bedroom?  Use night lights.  Make sure that you have a light by your bed that is easy to reach.  Do not use any sheets or blankets that are too big for your bed. They should not hang down onto the floor.  Have a firm chair that  has side arms. You can use this for support while you get dressed.  Do not have throw rugs and other things on the floor that can make you trip. What can I do in the kitchen?  Clean up any spills right away.  Avoid walking on wet floors.  Keep items that you use a lot in easy-to-reach places.  If you need to reach something above you, use a strong step stool that has a grab bar.  Keep electrical cords out of the way.  Do not use floor polish or wax that makes floors slippery. If you must use wax, use non-skid floor wax.  Do not have throw rugs and other things on the floor that can make you trip. What can I do with my stairs?  Do not leave any items on the stairs.  Make sure that there are handrails on both sides of the stairs and use them. Fix handrails that are broken or loose. Make sure that handrails are as long as the stairways.  Check any carpeting to make sure that it is firmly  attached to the stairs. Fix any carpet that is loose or worn.  Avoid having throw rugs at the top or bottom of the stairs. If you do have throw rugs, attach them to the floor with carpet tape.  Make sure that you have a light switch at the top of the stairs and the bottom of the stairs. If you do not have them, ask someone to add them for you. What else can I do to help prevent falls?  Wear shoes that:  Do not have high heels.  Have rubber bottoms.  Are comfortable and fit you well.  Are closed at the toe. Do not wear sandals.  If you use a stepladder:  Make sure that it is fully opened. Do not climb a closed stepladder.  Make sure that both sides of the stepladder are locked into place.  Ask someone to hold it for you, if possible.  Clearly mark and make sure that you can see:  Any grab bars or handrails.  First and last steps.  Where the edge of each step is.  Use tools that help you move around (mobility aids) if they are needed. These include:  Canes.  Walkers.  Scooters.  Crutches.  Turn on the lights when you go into a dark area. Replace any light bulbs as soon as they burn out.  Set up your furniture so you have a clear path. Avoid moving your furniture around.  If any of your floors are uneven, fix them.  If there are any pets around you, be aware of where they are.  Review your medicines with your doctor. Some medicines can make you feel dizzy. This can increase your chance of falling. Ask your doctor what other things that you can do to help prevent falls. This information is not intended to replace advice given to you by your health care provider. Make sure you discuss any questions you have with your health care provider. Document Released: 01/25/2009 Document Revised: 09/06/2015 Document Reviewed: 05/05/2014 Elsevier Interactive Patient Education  2017 [employer].

## 2017-02-17 ENCOUNTER — Telehealth: Payer: Self-pay

## 2017-02-17 NOTE — Telephone Encounter (Signed)
There is no alternative to my knowledge - please contact pharm rep to see if samples available. Thanks

## 2017-02-17 NOTE — Telephone Encounter (Signed)
Patient called to request samples of Utibron, we are currently without samples.  Patient then questioned if this medication can be changed to a more affordable medication. Patient unable to pay for medication and we do not have samples frequently and she needs something.   Please advise

## 2017-02-18 ENCOUNTER — Other Ambulatory Visit: Payer: Self-pay | Admitting: *Deleted

## 2017-02-18 MED ORDER — LORAZEPAM 0.5 MG PO TABS: ORAL_TABLET | ORAL | 1 refills | Status: DC

## 2017-02-18 NOTE — Telephone Encounter (Signed)
[facility] 

## 2017-02-18 NOTE — Telephone Encounter (Signed)
Left message informing patient no alternative, we do not have a means to contact a sell representative. Please advise  on a long term plan for patient and her need for a bronchodilator.

## 2017-02-19 MED ORDER — TIOTROPIUM BROMIDE MONOHYDRATE 18 MCG IN CAPS: 18.0000 ug | ORAL_CAPSULE | Freq: Every day | RESPIRATORY_TRACT | 1 refills | Status: DC

## 2017-02-19 NOTE — Telephone Encounter (Signed)
Will d/c utibron as samples unavailable. Rx spiriva handihaler #30 capsules; take 1 inhalation daily with 4RF; please provide samples if available

## 2017-02-19 NOTE — Telephone Encounter (Signed)
Left message on voicemail for patient to return call when available , no samples of spiriva, will send in rx if patient in agreement

## 2017-02-19 NOTE — Telephone Encounter (Signed)
Spoke with patient, patient states she will try Spiriva, patient states Spiriva was too expensive in the past.  Grace [name] requested that I send rx to [place], rx sent

## 2017-03-10 DIAGNOSIS — Z1211 Encounter for screening for malignant neoplasm of colon: Secondary | ICD-10-CM | POA: Diagnosis not present

## 2017-03-10 DIAGNOSIS — Z1212 Encounter for screening for malignant neoplasm of rectum: Secondary | ICD-10-CM | POA: Diagnosis not present

## 2017-03-10 LAB — COLOGUARD: COLOGUARD: NEGATIVE

## 2017-03-19 ENCOUNTER — Encounter: Payer: Self-pay | Admitting: *Deleted

## 2017-03-24 ENCOUNTER — Ambulatory Visit: Payer: Medicare Other | Admitting: Internal Medicine

## 2017-03-29 ENCOUNTER — Other Ambulatory Visit: Payer: Self-pay | Admitting: Internal Medicine

## 2017-03-31 ENCOUNTER — Other Ambulatory Visit: Payer: Self-pay | Admitting: Internal Medicine

## 2017-03-31 ENCOUNTER — Other Ambulatory Visit: Payer: Self-pay | Admitting: Nurse Practitioner

## 2017-04-28 ENCOUNTER — Ambulatory Visit (INDEPENDENT_AMBULATORY_CARE_PROVIDER_SITE_OTHER): Payer: Medicare Other | Admitting: Internal Medicine

## 2017-04-28 ENCOUNTER — Encounter: Payer: Self-pay | Admitting: Internal Medicine

## 2017-04-28 VITALS — BP 152/90 | HR 74 | Temp 98.0°F | Ht 63.0 in | Wt 210.0 lb

## 2017-04-28 DIAGNOSIS — E034 Atrophy of thyroid (acquired): Secondary | ICD-10-CM | POA: Diagnosis not present

## 2017-04-28 DIAGNOSIS — F4320 Adjustment disorder, unspecified: Secondary | ICD-10-CM

## 2017-04-28 DIAGNOSIS — E1169 Type 2 diabetes mellitus with other specified complication: Secondary | ICD-10-CM | POA: Diagnosis not present

## 2017-04-28 DIAGNOSIS — E785 Hyperlipidemia, unspecified: Secondary | ICD-10-CM

## 2017-04-28 DIAGNOSIS — E1140 Type 2 diabetes mellitus with diabetic neuropathy, unspecified: Secondary | ICD-10-CM | POA: Diagnosis not present

## 2017-04-28 DIAGNOSIS — K22719 Barrett's esophagus with dysplasia, unspecified: Secondary | ICD-10-CM

## 2017-04-28 DIAGNOSIS — J4489 Other specified chronic obstructive pulmonary disease: Secondary | ICD-10-CM

## 2017-04-28 DIAGNOSIS — J449 Chronic obstructive pulmonary disease, unspecified: Secondary | ICD-10-CM | POA: Diagnosis not present

## 2017-04-28 DIAGNOSIS — F4321 Adjustment disorder with depressed mood: Secondary | ICD-10-CM | POA: Diagnosis not present

## 2017-04-28 DIAGNOSIS — M67441 Ganglion, right hand: Secondary | ICD-10-CM | POA: Diagnosis not present

## 2017-04-28 DIAGNOSIS — Z6837 Body mass index (BMI) 37.0-37.9, adult: Secondary | ICD-10-CM

## 2017-04-28 DIAGNOSIS — I10 Essential (primary) hypertension: Secondary | ICD-10-CM

## 2017-04-28 MED ORDER — GLIPIZIDE ER 5 MG PO TB24: 5.0000 mg | ORAL_TABLET | Freq: Every day | ORAL | 3 refills | Status: DC

## 2017-04-28 NOTE — Progress Notes (Signed)
Patient ID: CORLIS ANGELICA, female   DOB: 1956-01-02, 62 y.o.   MRN: [id number]   Location:  [place] OFFICE  Provider: DR Arletha Grippe  Code Status: FULL CODE Goals of Care:  Advanced Directives 02/05/2017  Does Patient Have a Medical Advance Directive? No  Does patient want to make changes to medical advance directive? -  Would patient like information on creating a medical advance directive? Yes (MAU/Ambulatory/Procedural Areas - Information given)  Pre-existing out of facility DNR order (yellow form or pink MOST form) -     Chief Complaint  Patient presents with  . Medical Management of Chronic Issues    3 Month follow-up on grief reaction, DM, and HTN   . Medication Refill    Refill Glipizide   . Health Maintenance    Completed eye exam May-July 2018, will call for report([facility] on [address])     HPI: Patient is a 62 y.o. female seen today for medical management of chronic diseases.  She reports 3 mos hx right palmer knot. No pain. No known trauma. No difficulty grasping objects. BS up to 176 thus AM but she had difficulty sleeping last night due to severe nausea last night. She tried 7UP, mylanta/maalox and OTC nausea med.   increased stressors at home - The tree that was lying on her home x 6-8 weeks has been removed but she still has a hole in her roof but is unable to pay someone to fix it. She also is unable to pay her bills at home. She is really worried about her financial situation.  grief reaction - over mother's death in 2015-01-03. She reports she is improving with less insomnia and anxiety. Takes melatonin 59m. trazodone was ineffective. lorazepam helps better. No SI/HI. She is no longer seeing counselor through hospice.  COPD/asthma hx - no recent exacerbations. She gets SOB with mild exertion. She takes flovent, proair. [facility]. Anora caused chest pain. utibron helps but it is very expensive.   Hypothyroid - TSH  0.47; Free T4 level 1. Takes Levothyroxine 150 mcg daily. (+) hot intolerance. She reports issues with thinning hair.  Tobacco abuse - she stopped smoking but is now eating more. Weight now 210 lbs and has steadily increased from 196-->173-->175-->190-->198-->203-->213 lbs since June 2017  HTN - BP overall stable on lisinopril and metoprolol  DM - A1c 6.1%. She takes glipizide. No low BS reactions. Urine microalbumin/Cr ratio 88 (down from 93). BS 100s. Rarely >150  Hyperlipidemia - stable on lipitor. LDL 53; HDL 46  Peripheral neuropathy - due to DM. unchanged. She stopped gabapentin  GERD - symptomatic on low dose omeprazole. She occasionally has epigastric pain that awakens from sleep and causes nausea/vomiting. Pain resolves after emesis. She has a hx barrett's esophagus and has not seen GI in > 1 yr. No hematemesis. She takes "liquid" acid indigestion" med that helps. cologuard was negative.  Obesity - weight down 3 lbs since Oct 2018. She would like to start Silver Sneakers. No healthy diet. She has poor exercise tolerance. BMI 37.2.  Past Medical History:  Diagnosis Date  . Abnormal vaginal bleeding   . Asthma   . Barrett's esophagus    with high grade dysplasia per endoscopy 05/2005 // followed by Dr. CSilvano Rusk([facility])  . Chronic back pain   . COPD (chronic obstructive pulmonary disease) ([place]   . Diabetes mellitus without complication ([facility]   . Duodenal ulcer disease  thought to be contributed at least partly by overuse of NSAIDs and salicylates (goody powdr)  . Endometrial polyp 03/2003   s/p resection in 03/2003. Path showing submucosal leiomyoma and benign  proliferative type endometrium  . Fibroid uterus    s/p myomectomy x 2  . GERD (gastroesophageal reflux disease)   . H/O failed moderate sedation   . Hiatal hernia   . History of CVA (cerebrovascular accident) 10/2000  . 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 Hyperlipidemia   . Hypertension   . Hypothyroidism    with hx of multinodular goiter (noted on neck US in 11/2002 - showing diffuse nodularity and inhomogenous texture diffusely BL)  . Internal hemorrhoids    grade 2 per colonoscopy in 09/2006 - repeat colonoscopy rec in 5-10 years.  . Migraine   . Nevus   . Personal history of failed conscious sedation 08/11/2011  . Tachycardia   . Thyroid disease   . Tobacco abuse Jan 2014   quit    Past Surgical History:  Procedure Laterality Date  . CHOLECYSTECTOMY    . COLONOSCOPY  02/07/2010  . HYSTEROSCOPY    . LAPAROSCOPIC APPENDECTOMY N/A 09/23/2015   Procedure: APPENDECTOMY LAPAROSCOPIC;  Surgeon: Georganna Skeans, MD;  Location: [place];  Service: General;  Laterality: N/A;  . [place]   x 2 - In 1997, noted to have extensive pelvic adhesions and BL tubal obstruction  . UPPER GASTROINTESTINAL ENDOSCOPY  12/18/2009     reports that she quit smoking about 1 years ago. Her smoking use included cigarettes. She has a 42.00 pack-year smoking history. she has never used smokeless tobacco. She reports that she does not drink alcohol or use drugs. Social History   Socioeconomic History  . Marital status: Divorced    Spouse name: Not on file  . Number of children: Not on file  . Years of education: Not on file  . Highest education level: Not on file  Social Needs  . Financial resource strain: Not on file  . Food insecurity - worry: Not on file  . Food insecurity - inability: Not on file  . Transportation needs - medical: Not on file  . Transportation needs - non-medical: Not on file  Occupational History  . Not on file  Tobacco Use  . Smoking status: Former Smoker    Packs/day: 1.00    Years: 42.00    Pack years: 42.00    Types: Cigarettes    Last attempt to quit: 05/10/2015    Years since quitting: 1.9  . Smokeless tobacco: Never Used  . Tobacco comment: Counseling sheet 07-2011     Substance and Sexual Activity  . Alcohol use: No    Alcohol/week: 0.0 oz  . Drug use: No  . Sexual activity: No  Other Topics Concern  . Not on file  Social History Narrative         Financial assistance approved for 100% discount at [facility] and has [facility] card per Bonna Gains   01/15/2010      Diet- N/A   Caffeine- Goody powders   Married-Divorced   [employer] and lives with mother   Pets-2   Current/past profession-[employer]   Exercise-No   Living will-No   DNR-No   POA/HPOA-No          Family History  Problem Relation Age of Onset  .  COPD Mother   . Heart disease Father        had 5 heart attacks, first at age 23s.  Marland Kitchen COPD Father     Allergies  Allergen Reactions  . Anoro Ellipta [Umeclidinium-Vilanterol] Other (See Comments)    Chest pain if used more than 3 consecutive days   . Codeine     REACTION: headaches  . Prozac [Fluoxetine Hcl] Other (See Comments)    Suicidal ideations    Outpatient Encounter Medications as of 04/28/2017  Medication Sig  . albuterol (PROAIR HFA) 108 (90 Base) MCG/ACT inhaler Inhale two puffs into lungs every 6 hours as needed for wheezing or for SOB  . Aspirin-Acetaminophen-Caffeine 500-325-65 MG PACK Take 1 Package by mouth as needed.   Marland Kitchen atorvastatin (LIPITOR) 80 MG tablet TAKE ONE TABLET BY MOUTH  ONCE DAILY AT BEDTIME FOR  CHOLESTEROL  . glipiZIDE (GLUCOTROL XL) 5 MG 24 hr tablet Take 1 tablet (5 mg total) by mouth daily with breakfast.  . GLUCOSAMINE-CHONDROITIN-MSM PO Take 3 tablets by mouth daily.  Marland Kitchen ibuprofen (ADVIL,MOTRIN) 200 MG tablet Take 600 mg by mouth as needed for moderate pain.   Marland Kitchen ipratropium-albuterol (DUONEB) 0.5-2.5 (3) MG/3ML SOLN Take 3 mLs by nebulization every 6 (six) hours as needed. For shortness of breath or wheezing  . Ketotifen Fumarate (REFRESH EYE ITCH RELIEF OP) Place 1 drop into both eyes daily as needed. For itchy eyes  . levothyroxine (SYNTHROID, LEVOTHROID) 150 MCG tablet Take one tablet by mouth 30  minutes before breakfast once daily for thyroid  . lisinopril (PRINIVIL,ZESTRIL) 10 MG tablet TAKE 1/2 TABLET BY MOUTH  ONCE DAILY FOR BLOOD  PRESSURE  . LORazepam (ATIVAN) 0.5 MG tablet Take one tablet by mouth three times daily as needed for anxiety or nervousness  . metoprolol tartrate (LOPRESSOR) 25 MG tablet TAKE ONE-HALF TABLET BY  MOUTH TWO TIMES DAILY  . omeprazole (PRILOSEC) 20 MG capsule TAKE 1 CAPSULE BY MOUTH  DAILY  . ONETOUCH DELICA LANCETS 05L MISC USE TWO TIMES DAILY  . ONETOUCH VERIO test strip CHECK BLOOD SUGAR TWO TIMES DAILY AND AS DIRECTED  . SPIRIVA HANDIHALER 18 MCG inhalation capsule PLACE 1 CAPSULE INTO  HANDIHALER AND INHALE THE  CONTENTS OF 1 CAPSULE VIA  HANDIHALER DAILY   No facility-administered encounter medications on file as of 04/28/2017.     Review of Systems:  Review of Systems  Gastrointestinal: Positive for constipation and nausea.  Musculoskeletal: Positive for arthralgias.  Psychiatric/Behavioral: The patient is nervous/anxious.   All other systems reviewed and are negative.   Health Maintenance  Topic Date Due  . OPHTHALMOLOGY EXAM  03/19/2016  . HIV Screening  04/28/2018 (Originally 05/10/1970)  . PNEUMOCOCCAL POLYSACCHARIDE VACCINE (2) 05/20/2017  . HEMOGLOBIN A1C  06/22/2017  . PAP SMEAR  07/30/2017  . MAMMOGRAM  11/20/2017  . FOOT EXAM  12/23/2017  . Fecal DNA (Cologuard)  03/10/2020  . TETANUS/TDAP  06/11/2021  . INFLUENZA VACCINE  Completed  . Hepatitis C Screening  Completed    Physical Exam: Vitals:   04/28/17 1054  BP: (!) 152/90  Pulse: 74  Temp: 98 F (36.7 C)  TempSrc: Oral  SpO2: 94%  Weight: 210 lb (95.3 kg)  Height: 5' 3"  (1.6 m)   Body mass index is 37.2 kg/m. Physical Exam  Constitutional: She is oriented to person, place, and time. She appears well-developed and well-nourished.  HENT:  Mouth/Throat: Oropharynx is clear and moist. No oropharyngeal exudate.  MMM; no oral thrush  Eyes:  Pupils are equal, round,  and reactive to light. No scleral icterus.  Neck: Neck supple. Carotid bruit is not present. No tracheal deviation present. No thyromegaly present.  Cardiovascular: Normal rate, regular rhythm and intact distal pulses. Exam reveals no gallop and no friction rub.  Murmur (1/6 SEM) heard. No LE edema b/l. no calf TTP.   Pulmonary/Chest: Effort normal and breath sounds normal. No stridor. No respiratory distress. She has no wheezes. She has no rales.  Abdominal: Soft. Normal appearance and bowel sounds are normal. She exhibits distension. She exhibits no mass. There is no hepatomegaly. There is tenderness. There is no rigidity, no rebound and no guarding. No hernia.  obese  Musculoskeletal: She exhibits edema.  Lymphadenopathy:    She has no cervical adenopathy.  Neurological: She is alert and oriented to person, place, and time. She has normal reflexes.  Skin: Skin is warm and dry. No rash noted.  Right palmer freely mobile NT pea sized cyst. No redness  Psychiatric: She has a normal mood and affect. Her behavior is normal. Judgment and thought content normal.    Labs reviewed: Basic Metabolic Panel: Recent Labs    05/21/16 1026 09/19/16 1512 12/23/16 1212  NA 142 140 140  K 4.2 4.5 4.0  CL 105 105 105  CO2 26 27 28   GLUCOSE 113* 92 96  BUN 18 19 10   CREATININE 0.82 0.80 0.87  CALCIUM 9.9 9.4 9.2  TSH 2.21 0.81 0.47   Liver Function Tests: Recent Labs    05/21/16 1026 09/19/16 1512 12/23/16 1212  AST  --   --  20  ALT 14 15 21   BILITOT  --   --  0.3  PROT  --   --  6.8   No results for input(s): LIPASE, AMYLASE in the last 8760 hours. No results for input(s): AMMONIA in the last 8760 hours. CBC: No results for input(s): WBC, NEUTROABS, HGB, HCT, MCV, PLT in the last 8760 hours. Lipid Panel: Recent Labs    05/21/16 1026 09/19/16 1512 12/23/16 1212  CHOL 128 148 117  HDL 50* 49* 46*  LDLCALC 62 73  --   TRIG 79 130 93  CHOLHDL 2.6 3.0 2.5   Lab Results    Component Value Date   HGBA1C 6.1 (H) 12/23/2016    Procedures since last visit: No results found.  Assessment/Plan   ICD-10-CM   1. Ganglion cyst of tendon sheath of right hand M67.441    pea sized palm area on 04/28/17  2. Barrett's esophagus with dysplasia K22.719   3. Type 2 diabetes mellitus with diabetic neuropathy, without long-term current use of insulin (HCC) E11.40 glipiZIDE (GLUCOTROL XL) 5 MG 24 hr tablet    Hemoglobin A1c    CMP with eGFR([facility])    CBC with Differential/Platelets  4. Essential hypertension I10   5. Hypothyroidism due to acquired atrophy of thyroid E03.4 TSH  6. COPD with asthma ([place]) J44.9   7. Hyperlipidemia associated with type 2 diabetes mellitus (HCC) E11.69 Lipid Panel   E78.5   8. Grief reaction F43.21   9. Class 2 severe obesity due to excess calories with serious comorbidity and body mass index (BMI) of 37.0 to 37.9 in adult [facility]) E66.01    Z68.37     Recommend you take mineral oil daily as needed for severe constipation  Continue current medications as ordered  Hand cyst benign - watch for redness, pain and increased size  Increase exercise as tolerated  Maintain healthy food  choices. Reduce calories to 1600 per day  Will call with lab results  Follow up in 3 mos for DM, HTN, hyperlipidemia, GERD, obesity   Frank Pilger S. Perlie Gold  [facility] [address] [place], [place] [address] [phone] Cell (Monday-Friday 8 AM - 5 PM) [phone] After 5 PM and follow prompts

## 2017-04-28 NOTE — Patient Instructions (Addendum)
Recommend you take mineral oil daily as needed for severe constipation  Continue current medications as ordered  Hand cyst benign - watch for redness, pain and increased size  Increase exercise as tolerated  Maintain healthy food choices. Reduce calories to 1600 per day  Will call with lab results  Follow up in 3 mos for DM, HTN, hyperlipidemia, GERD, obesity

## 2017-04-29 LAB — COMPLETE METABOLIC PANEL WITH GFR
AG RATIO: 1.4 (calc) (ref 1.0–2.5)
ALT: 14 U/L (ref 6–29)
AST: 16 U/L (ref 10–35)
Albumin: 4.1 g/dL (ref 3.6–5.1)
Alkaline phosphatase (APISO): 94 U/L (ref 33–130)
BUN: 13 mg/dL (ref 7–25)
CO2: 32 mmol/L (ref 20–32)
Calcium: 9.7 mg/dL (ref 8.6–10.4)
Chloride: 105 mmol/L (ref 98–110)
Creat: 0.84 mg/dL (ref 0.50–0.99)
GFR, EST AFRICAN AMERICAN: 87 mL/min/{1.73_m2} (ref >=60)
GFR, Est Non African American: 75 mL/min/{1.73_m2} (ref >=60)
Globulin: 2.9 g/dL (calc) (ref 1.9–3.7)
Glucose, Bld: 90 mg/dL (ref 65–139)
Potassium: 4.2 mmol/L (ref 3.5–5.3)
SODIUM: 143 mmol/L (ref 135–146)
Total Bilirubin: 0.2 mg/dL (ref 0.2–1.2)
Total Protein: 7 g/dL (ref 6.1–8.1)

## 2017-04-29 LAB — CBC WITH DIFFERENTIAL/PLATELET
Basophils Absolute: 23 cells/uL (ref 0–200)
Basophils Relative: 0.3 %
EOS PCT: 3.7 %
Eosinophils Absolute: 289 cells/uL (ref 15–500)
HCT: 39 % (ref 35.0–45.0)
Hemoglobin: 12.5 g/dL (ref 11.7–15.5)
Lymphs Abs: 1693 cells/uL (ref 850–3900)
MCH: 27.1 pg (ref 27.0–33.0)
MCHC: 32.1 g/dL (ref 32.0–36.0)
MCV: 84.4 fL (ref 80.0–100.0)
MPV: 11.4 fL (ref 7.5–12.5)
Monocytes Relative: 6.7 %
NEUTROS ABS: 5273 {cells}/uL (ref 1500–7800)
NEUTROS PCT: 67.6 %
PLATELETS: 264 10*3/uL (ref 140–400)
RBC: 4.62 10*6/uL (ref 3.80–5.10)
RDW: 13.3 % (ref 11.0–15.0)
Total Lymphocyte: 21.7 %
WBC: 7.8 10*3/uL (ref 3.8–10.8)
WBCMIX: 523 {cells}/uL (ref 200–950)

## 2017-04-29 LAB — LIPID PANEL
Cholesterol: 139 mg/dL (ref <200)
HDL: 48 mg/dL — AB (ref >50)
LDL Cholesterol (Calc): 72 mg/dL (calc)
Non-HDL Cholesterol (Calc): 91 mg/dL (calc) (ref <130)
TRIGLYCERIDES: 109 mg/dL (ref <150)
Total CHOL/HDL Ratio: 2.9 (calc) (ref <5.0)

## 2017-04-29 LAB — HEMOGLOBIN A1C
Hgb A1c MFr Bld: 6.3 % of total Hgb — ABNORMAL HIGH (ref <5.7)
Mean Plasma Glucose: 134 (calc)
eAG (mmol/L): 7.4 (calc)

## 2017-04-29 LAB — TSH: TSH: 13.53 mIU/L — ABNORMAL HIGH (ref 0.40–4.50)

## 2017-05-01 ENCOUNTER — Telehealth: Payer: Self-pay

## 2017-05-01 DIAGNOSIS — E038 Other specified hypothyroidism: Secondary | ICD-10-CM

## 2017-05-01 MED ORDER — LEVOTHYROXINE SODIUM 175 MCG PO TABS: 175.0000 ug | ORAL_TABLET | Freq: Every day | ORAL | 1 refills | Status: DC

## 2017-05-01 NOTE — Telephone Encounter (Signed)
Discussed labs results with patient, patient verbalized understanding of results:  Increase levothyroxine 130mcg #30 take 1 tab po daily with 3 RF; reck TSH in 6 weeks; diabetes well controlled; cholesterol is at goal; other labs stable; follow up as scheduled and continue other medications as ordered  Scheduled appointment to recheck labs in march 2019, order placed.  RX for Levothyroxine sent to mail order company per patient request.  Copy of labs mailed to patient

## 2017-05-06 ENCOUNTER — Telehealth: Payer: Self-pay | Admitting: *Deleted

## 2017-05-06 NOTE — Telephone Encounter (Signed)
Increase lisinopril to 1 whole tablet daily. Continue home BP checks

## 2017-05-06 NOTE — Telephone Encounter (Signed)
Patient called back with blood pressure readings: 05/04/17- 2:00pm 154/88 05/04/17- 3:00pm 144/91 05/05/17- 8:00am 158/95 05/05/17- 3:00pm 150/88 05/05/17- 7:00pm 158/95  Patient is currently taking Metoprolol 25mg  1/2 tablet bid and Lisinopril 10mg  1/2 tablet daily. Please Advise.

## 2017-05-06 NOTE — Telephone Encounter (Signed)
LMOM to return call.

## 2017-05-06 NOTE — Telephone Encounter (Signed)
Patient called and left message on voicemail and stated that she was concerned with her blood pressure. Wanted to know if it looked ok to you for reassurance.   Called and left message with patient to call back with dates and blood pressure readings.

## 2017-05-07 NOTE — Telephone Encounter (Signed)
LMOM to return call.

## 2017-05-07 NOTE — Telephone Encounter (Signed)
Patient notified and agreed. Medication list updated.  

## 2017-06-10 ENCOUNTER — Other Ambulatory Visit: Payer: Self-pay | Admitting: *Deleted

## 2017-06-10 MED ORDER — LISINOPRIL 10 MG PO TABS: ORAL_TABLET | ORAL | 1 refills | Status: DC

## 2017-06-10 NOTE — Telephone Encounter (Signed)
Patient requested. Faxed to [employer]

## 2017-06-16 DIAGNOSIS — E038 Other specified hypothyroidism: Secondary | ICD-10-CM

## 2017-06-16 LAB — TSH: TSH: 1.58 mIU/L (ref 0.40–4.50)

## 2017-06-18 NOTE — Progress Notes (Signed)
This encounter was created in error - please disregard.

## 2017-06-25 IMAGING — CT CT ABD-PELV W/ CM
2 of 5 series · 16 of 46 positions shown, 18 images · IV contrast (Omni 300)
Comparison: None

CLINICAL DATA: Lower abdominal pain and nausea starting [REDACTED]
night

EXAM:
CT ABDOMEN AND PELVIS WITH CONTRAST
TECHNIQUE: Multidetector CT imaging of the abdomen and pelvis was performed
using the standard protocol following bolus administration of
intravenous contrast.
CONTRAST:  75mL W4L2CJ-1LL IOPAMIDOL (W4L2CJ-1LL) INJECTION 61%

[Series 2: a/p w/ 5mm · axial · 0.83mm/px · z∈[-434,-49]mm · 13 of 87 slices shown, 15 images]
[im 5/87  soft-tissue]
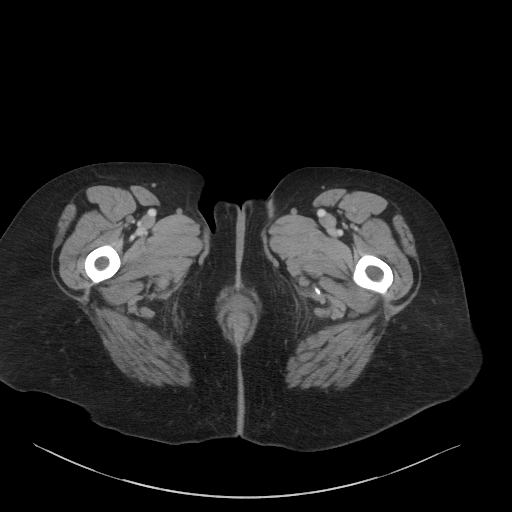
[im 5/87  bone]
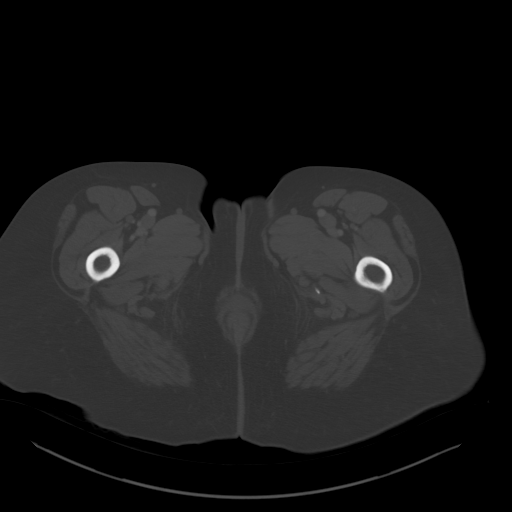
[im 13/87  soft-tissue]
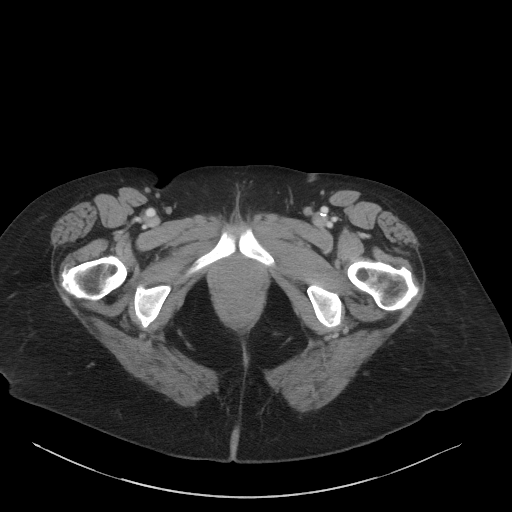
[im 18/87  soft-tissue]
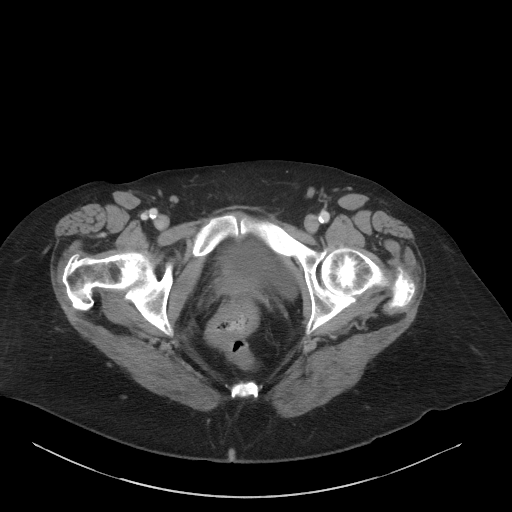
[im 26/87  soft-tissue]
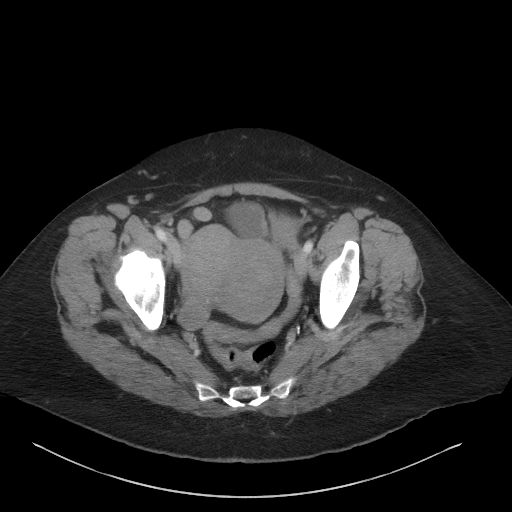
[im 31/87  soft-tissue]
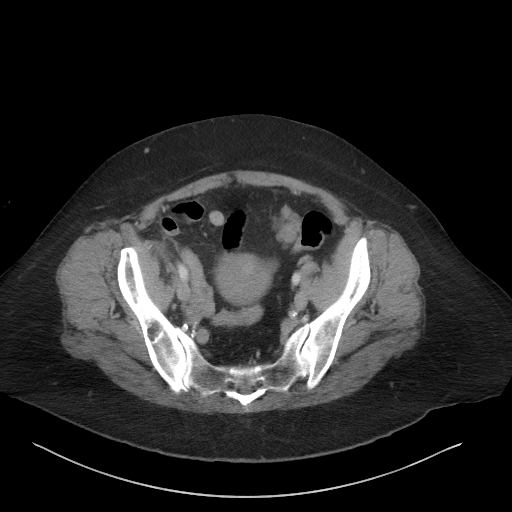
[im 39/87  soft-tissue]
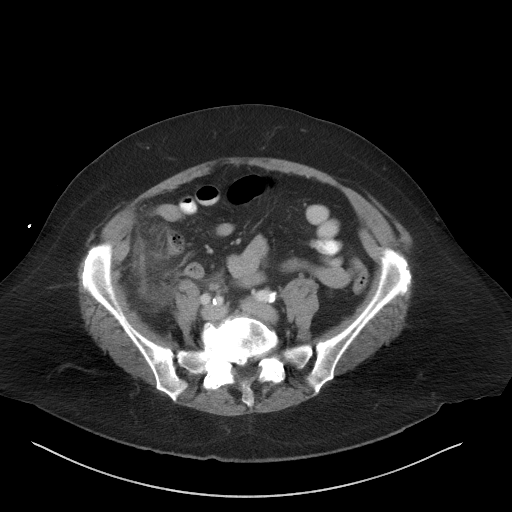
[im 44/87  soft-tissue]
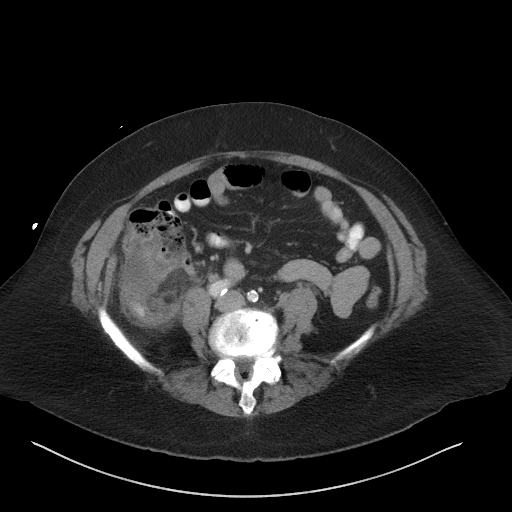
[im 48/87  soft-tissue]
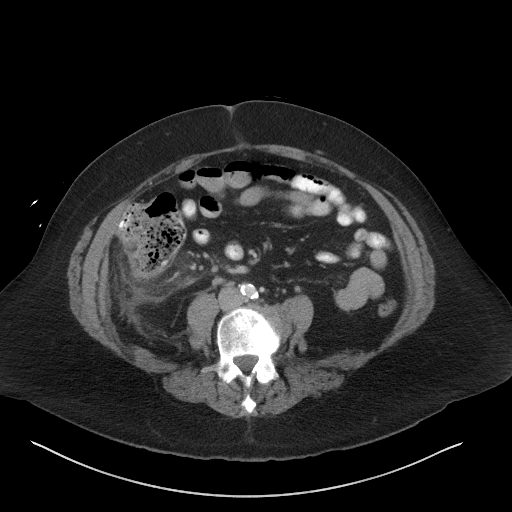
[im 56/87  soft-tissue]
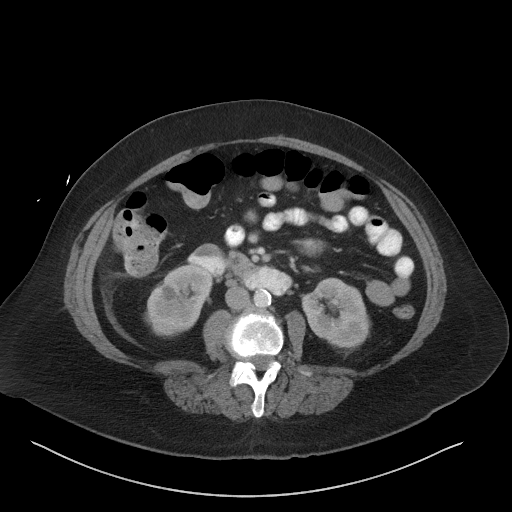
[im 56/87  bone]
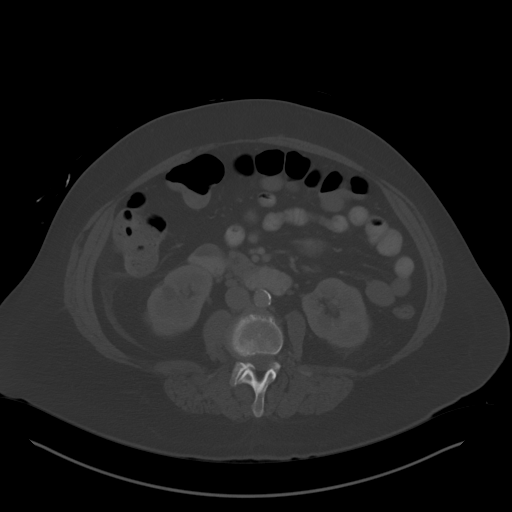
[im 61/87  soft-tissue]
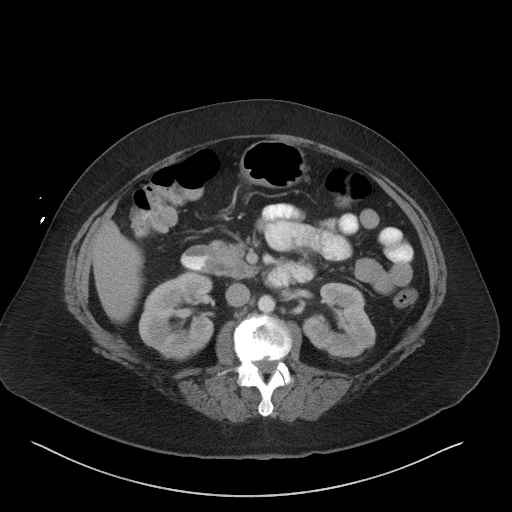
[im 69/87  soft-tissue]
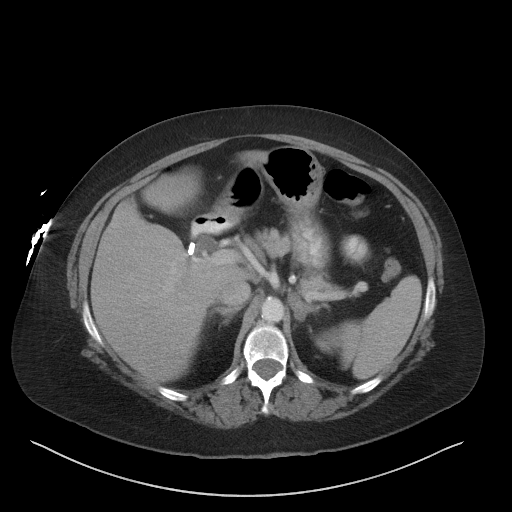
[im 74/87  soft-tissue]
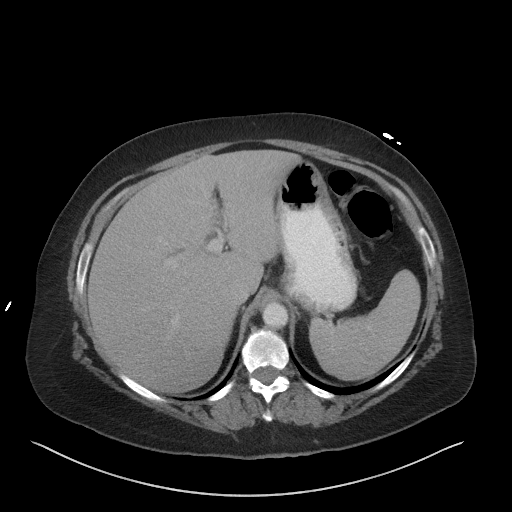
[im 82/87  soft-tissue]
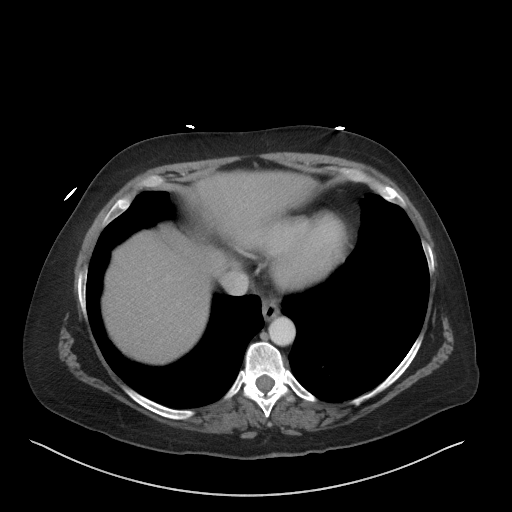

[Series 5: a/p w/ cor · coronal · 0.90mm/px · 3 of 148 slices shown]
[im 50/148  soft-tissue]
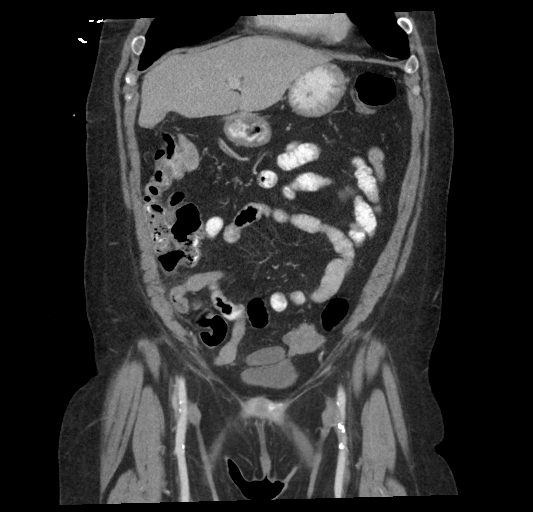
[im 66/148  soft-tissue]
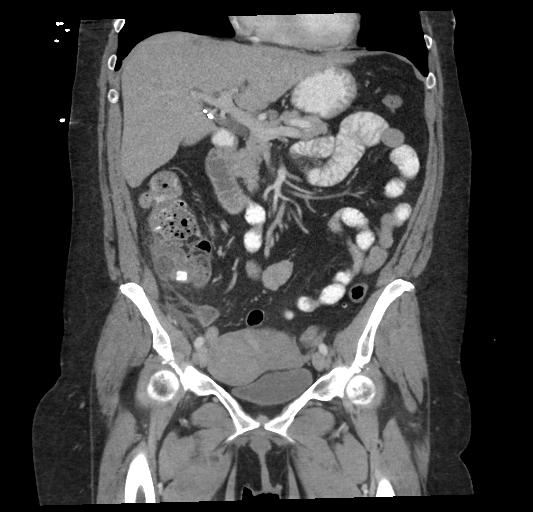
[im 82/148  soft-tissue]
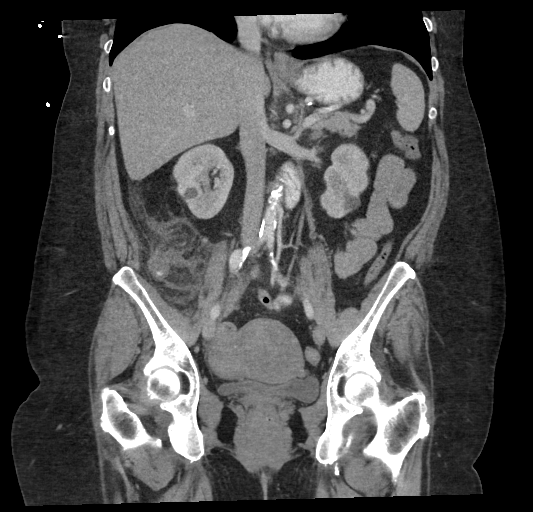

[16 of 46 positions shown; findings below may reference images not displayed]

FINDINGS: Lower chest:  The lung bases are unremarkable.

Hepatobiliary: The patient is status postcholecystectomy. Mild
intrahepatic biliary ductal dilatation. No focal hepatic mass.

Pancreas: Enhanced pancreas is unremarkable.

Spleen: Enhanced spleen is unremarkable.

Adrenals/Urinary Tract: Mild thickening of adrenal glands without
definite evidence of mass. Mild lobulated renal contour bilaterally.
No hydronephrosis or hydroureter. There is a cyst in lower pole of
the right kidney measures 1.2 cm. Cyst in lower pole of the left
kidney measures 1.5 cm. No hydronephrosis or hydroureter. Delayed
renal images shows bilateral renal symmetrical excretion. Bilateral
visualized proximal ureter is unremarkable.

Stomach/Bowel: No small bowel obstruction. No thickened or dilated
small bowel loops.

There is inflammatory process in right lower quadrant. There is
significant thickening of the appendix up to 2 cm. There is
significant periappendiceal stranding and small amount of
periappendiceal fluid is noted. At least 2 calcified appendicoliths
are noted the largest at the base of the appendix measures 9 mm.
Findings are consistent with acute appendicitis. There is no
definite evidence of perforation, however micro perforation cannot
be excluded.

Vascular/Lymphatic: Atherosclerotic calcifications of abdominal
aorta and iliac arteries are noted. No aortic aneurysm.

Reproductive: Mild enlarged uterus. At least 3 by material fibroids
are noted within uterus the largest in right fundal region measures
5.3 cm.

Other: There is no evidence of free abdominal air. Small amount of
fluid and stranding of the mesenteric fat is noted in right
paracolic gutter.

Musculoskeletal: No destructive bony lesions are noted. Degenerative
changes lumbar spine. Mild degenerative changes bilateral SI joints.
Mild degenerative changes pubic symphysis.
IMPRESSION: 1. Findings consistent with acute appendicitis as described above.
2. No hydronephrosis or hydroureter.
3. No small bowel obstruction.
4. No hydronephrosis or hydroureter.
5. Mild enlarged uterus with multiple myometrial fibroids the
largest measures 5.3 cm in right fundal region.
6. Degenerative changes lumbar spine.
These results were called by telephone at the time of interpretation
on 09/23/2015 at [DATE] to Dr. LUIS M CROW , who verbally
acknowledged these results.

## 2017-06-27 IMAGING — DX DG CHEST 1V PORT
1 series · 1 of 1 positions shown · non-contrast
Comparison: September 23, 2015

CLINICAL DATA: Shortness of Breath

EXAM:
PORTABLE CHEST 1 VIEW

[chest ap]
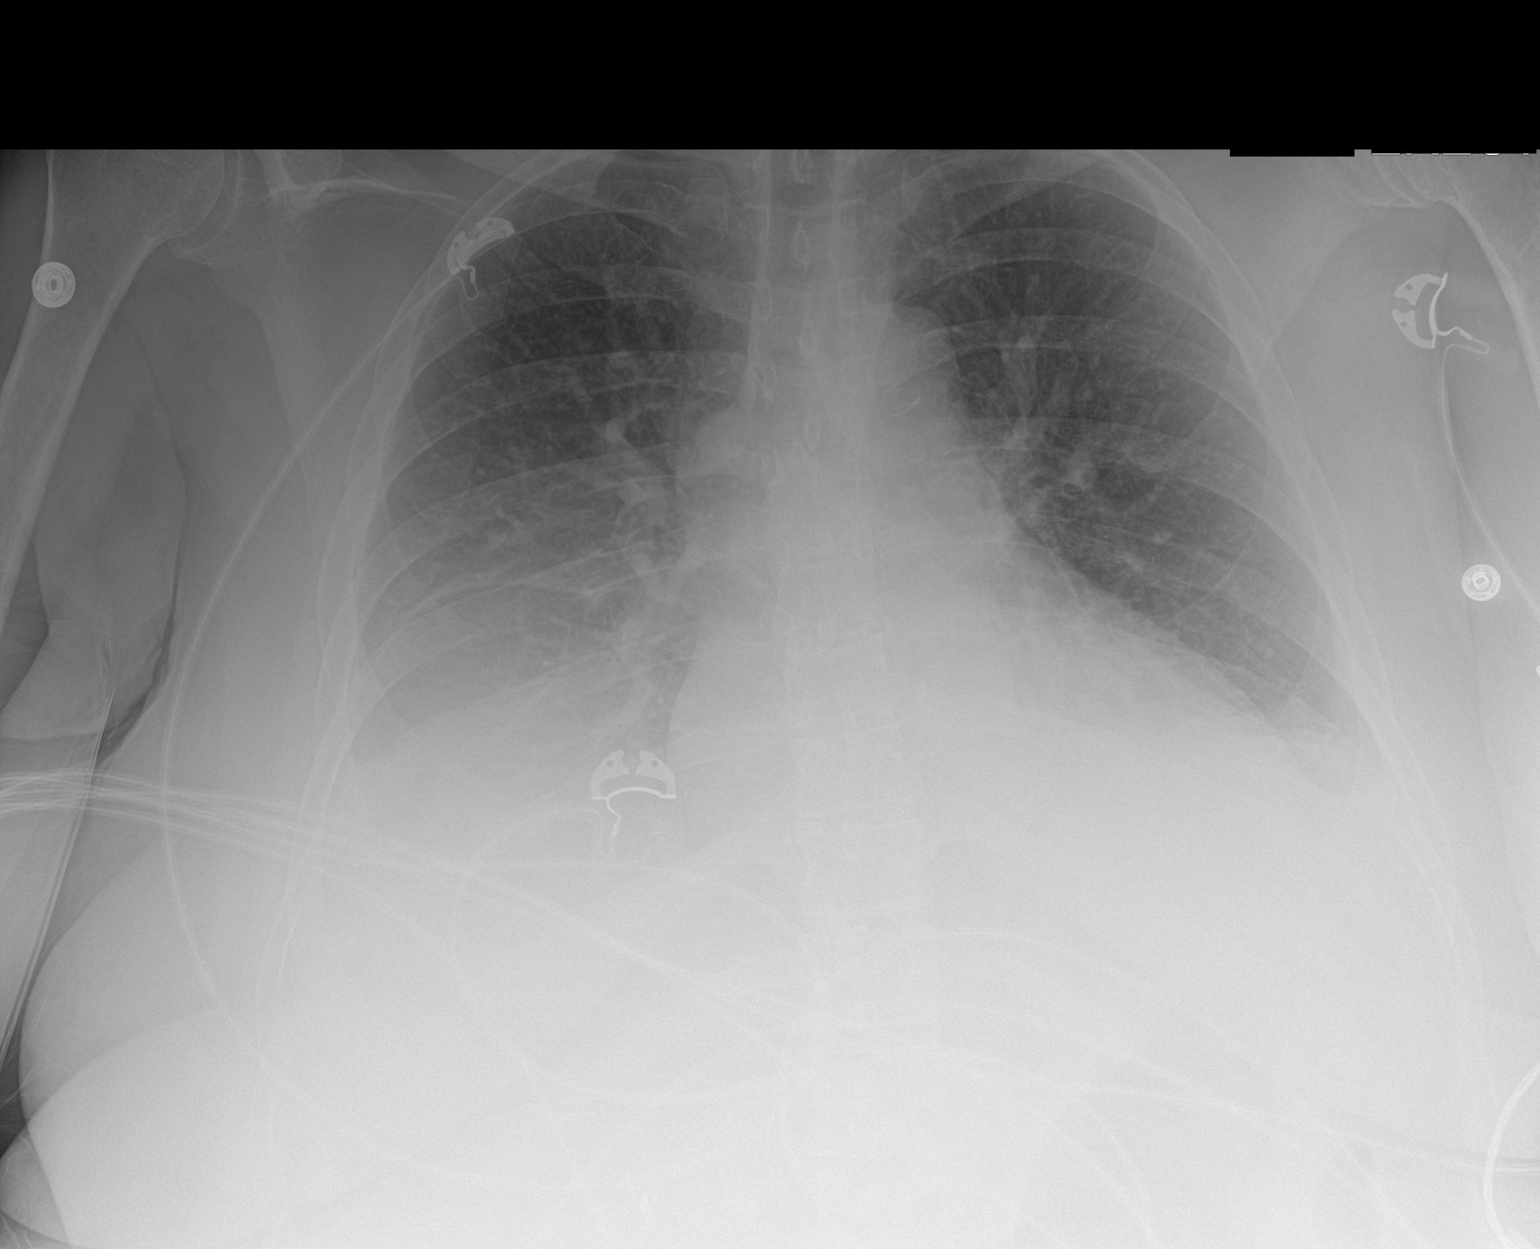

[1 of 1 positions shown; findings below may reference images not displayed]

FINDINGS: There are pleural effusions bilaterally. There is no edema or
consolidation. There is patchy atelectasis in the left lower lobe.
Heart is upper normal in size with pulmonary vascularity within
normal limits. No adenopathy. No bone lesions.
IMPRESSION: Pleural effusions bilaterally which appear fairly small. Lung
atelectasis left base. Heart upper normal in size. Suspect a degree
of pulmonary vascular congestion.

## 2017-06-28 IMAGING — CR DG CHEST 1V PORT
1 series · 1 of 1 positions shown · non-contrast
Comparison: 09/25/2015.

CLINICAL DATA: Respiratory failure.

EXAM:
PORTABLE CHEST 1 VIEW

[AP]
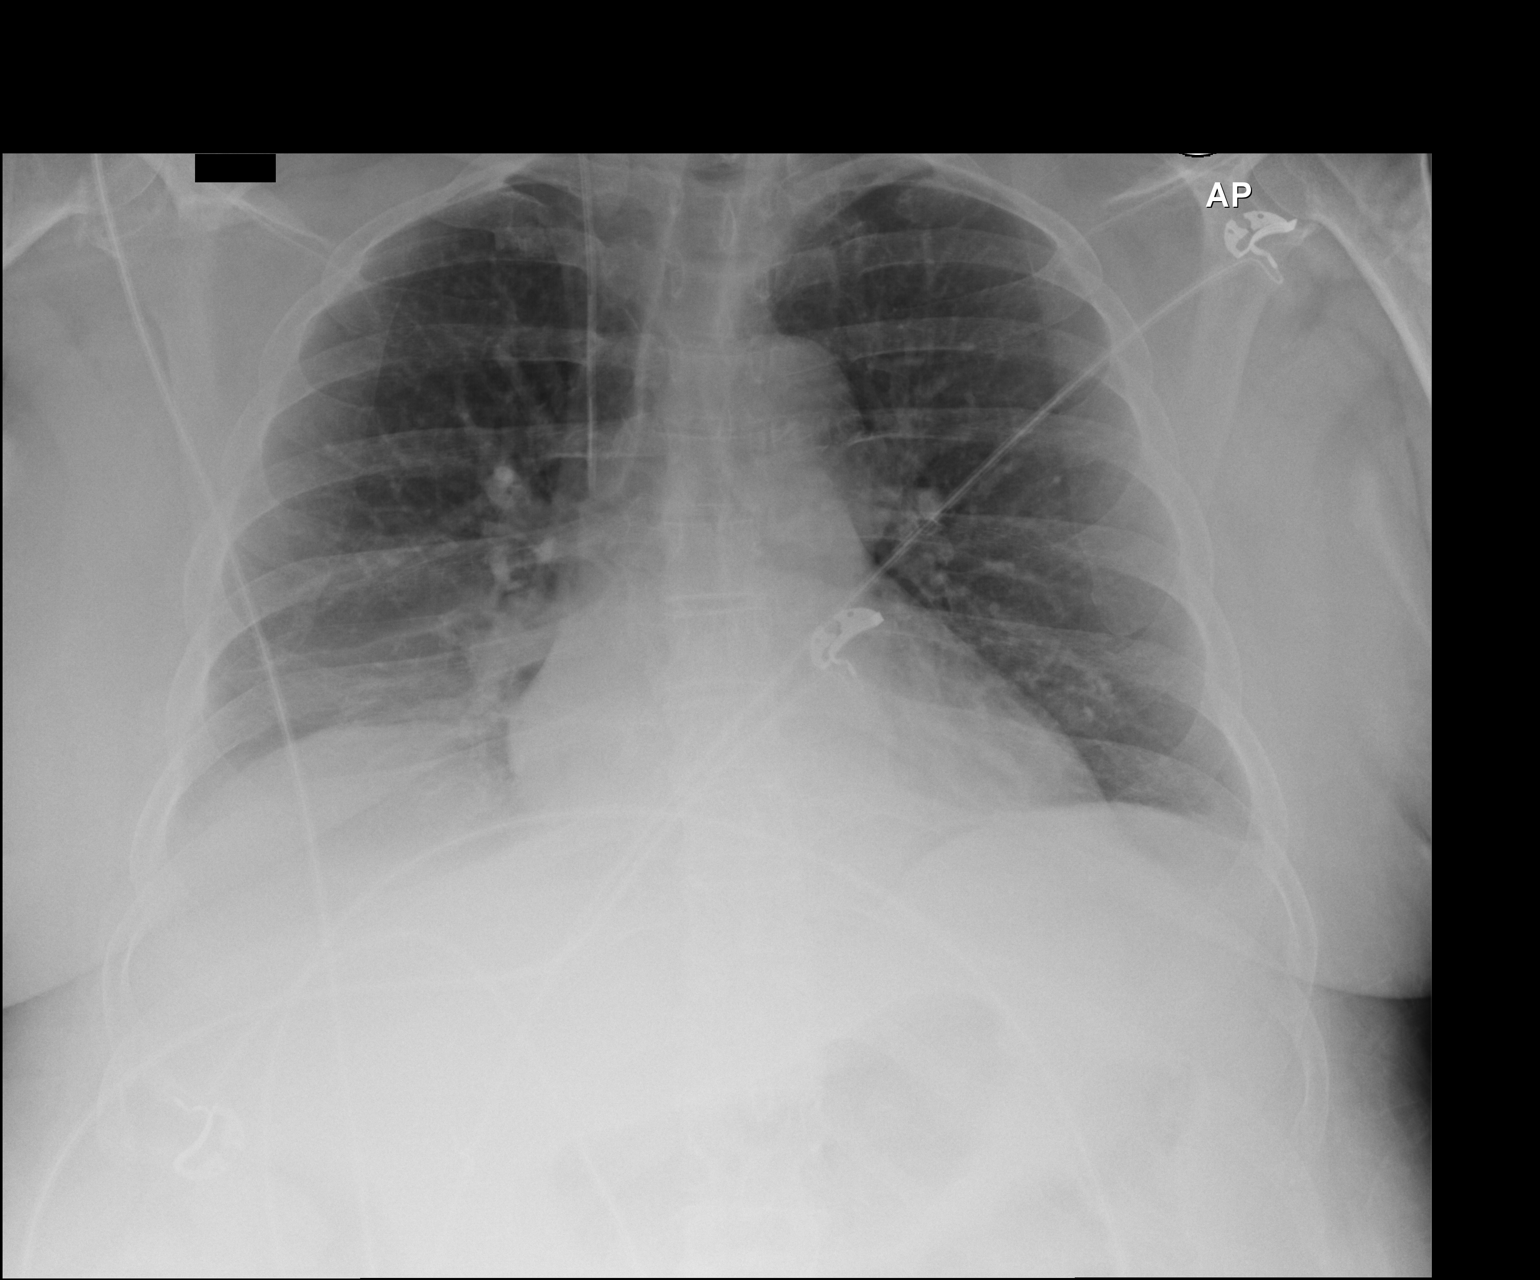

[1 of 1 positions shown; findings below may reference images not displayed]

FINDINGS: Right IJ line stable position. Mediastinum hilar structures normal.
Cardiomegaly with normal pulmonary vascularity. Low lung volumes
with mild bibasilar atelectasis and/or infiltrates again noted.
Small pleural effusions cannot be excluded.
IMPRESSION: 1. Right IJ line stable position.

2. Low lung volumes with bibasilar atelectasis and/or infiltrates.
No interim change. Small pleural effusions cannot be excluded.

## 2017-07-06 ENCOUNTER — Other Ambulatory Visit: Payer: Self-pay | Admitting: Internal Medicine

## 2017-07-17 ENCOUNTER — Other Ambulatory Visit: Payer: Self-pay | Admitting: Internal Medicine

## 2017-07-20 NOTE — Telephone Encounter (Signed)
[place] Database verified and compliance confirmed   Last filled 06/05/17  Dr.Carter please advise if ok to refill for #[id number]

## 2017-07-28 ENCOUNTER — Encounter: Payer: Self-pay | Admitting: Internal Medicine

## 2017-07-28 ENCOUNTER — Ambulatory Visit (INDEPENDENT_AMBULATORY_CARE_PROVIDER_SITE_OTHER): Payer: Medicare Other | Admitting: Internal Medicine

## 2017-07-28 VITALS — BP 118/70 | HR 79 | Temp 98.1°F | Ht 63.0 in | Wt 204.4 lb

## 2017-07-28 DIAGNOSIS — E785 Hyperlipidemia, unspecified: Secondary | ICD-10-CM

## 2017-07-28 DIAGNOSIS — E1169 Type 2 diabetes mellitus with other specified complication: Secondary | ICD-10-CM | POA: Diagnosis not present

## 2017-07-28 DIAGNOSIS — E1159 Type 2 diabetes mellitus with other circulatory complications: Secondary | ICD-10-CM | POA: Diagnosis not present

## 2017-07-28 DIAGNOSIS — J449 Chronic obstructive pulmonary disease, unspecified: Secondary | ICD-10-CM | POA: Diagnosis not present

## 2017-07-28 DIAGNOSIS — K22719 Barrett's esophagus with dysplasia, unspecified: Secondary | ICD-10-CM

## 2017-07-28 DIAGNOSIS — E034 Atrophy of thyroid (acquired): Secondary | ICD-10-CM

## 2017-07-28 DIAGNOSIS — E1140 Type 2 diabetes mellitus with diabetic neuropathy, unspecified: Secondary | ICD-10-CM

## 2017-07-28 DIAGNOSIS — I10 Essential (primary) hypertension: Secondary | ICD-10-CM | POA: Diagnosis not present

## 2017-07-28 NOTE — Patient Instructions (Signed)
Will call with lab results   [place] ISSUES  Continue current medications as ordered  Follow up in 3 mos for HTN, DM, hyperlipidemia, thryoid

## 2017-07-28 NOTE — Progress Notes (Signed)
Patient ID: Grace [name], female   DOB: March 12, 1956, 62 y.o.   MRN: [id number]   Location:  [facility] OFFICE  Provider: DR Arletha Grippe  Code Status:  Goals of Care:  Advanced Directives 02/05/2017  Does Patient Have a Medical Advance Directive? No  Does patient want to make changes to medical advance directive? -  Would patient like information on creating a medical advance directive? Yes (MAU/Ambulatory/Procedural Areas - Information given)  Pre-existing out of facility DNR order (yellow form or pink MOST form) -     Chief Complaint  Patient presents with  . Medical Management of Chronic Issues    3 mnth FU; Pt wants to discuss B/P ranges (fluctuating frequently); Review House Call Form  . Medication Refill    No Refills  . Advance Care Planning    Not on file    HPI: Patient is a 62 y.o. female seen today for medical management of chronic diseases.  She has not seen GI yet  She had a visit from [employer]. A1c 5% at visit  increased stressors at home - she still has a hole in her roof from tree that fell on it but is unable to pay someone to fix it. She also is unable to pay her bills at home. She is really worried about her financial situation. She has a cat that is dying from feline leukemia.  grief reaction - over mother's death in 11/27/2014. Unchanged. She reports she is improving with less insomnia and anxiety. Takes melatonin 8m. trazodone was ineffective. lorazepam helps better. No SI/HI. She is no longer seeing counselor through hospice.  COPD/asthma hx - no recent exacerbations. No changes. She gets SOB with mild exertion. She takes spiriva, proair. [facility] Anora caused chest pain. utibron helps but it is very expensive.   Hypothyroid - stable. TSH 1.58; Free T4 level 1. Takes Levothyroxine 175 mcg daily.  Tobacco abuse - she stopped smoking but is now eating more. Weight trending down (current 204 lbs) now 210 lbs  and has steadily increased from 196-->173-->175-->190-->198-->203-->213 lbs since June 2017  HTN - controlled on lisinopril and metoprolol  DM - A1c 6.3%. She takes glipizide. No low BS reactions. Urine microalbumin/Cr ratio 88 in Sept 2018. BS 100s. Rarely >150. She has neuropathy  Hyperlipidemia - stable on lipitor. LDL 72  Peripheral neuropathy - due to DM. unchanged. She stopped gabapentin  GERD - symptomatic on low dose omeprazole. She occasionally has epigastric pain that awakens from sleep and causes nausea/vomiting. Pain resolves after emesis. She has a hx barrett's esophagus and has not seen GI in > 1 yr. No hematemesis. She takes "liquid" acid indigestion" med that helps. cologuard was negative. She has not seen GI ye  Obesity - weight down 6 lbs. She is on somewhat healthy diet. She has poor exercise tolerance. BMI 36.21    Past Medical History:  Diagnosis Date  . Abnormal vaginal bleeding   . Asthma   . Barrett's esophagus    with high grade dysplasia per endoscopy 05/2005 // followed by Dr. CSilvano Rusk([facility])  . Chronic back pain   . COPD (chronic obstructive pulmonary disease) ([facility]   . Diabetes mellitus without complication ([place]   . Duodenal ulcer disease    thought to be contributed at least partly by overuse of NSAIDs and salicylates (goody powdr)  . Endometrial polyp 03/2003   s/p resection in 03/2003. Path showing  submucosal leiomyoma and benign  proliferative type endometrium  . Fibroid uterus    s/p myomectomy x 2  . GERD (gastroesophageal reflux disease)   . H/O failed moderate sedation   . Hiatal hernia   . History of CVA (cerebrovascular accident) 10/2000  . 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 Hyperlipidemia   . Hypertension   . Hypothyroidism    with hx of multinodular goiter (noted on neck US in 11/2002 - showing diffuse nodularity and inhomogenous texture  diffusely BL)  . Internal hemorrhoids    grade 2 per colonoscopy in 09/2006 - repeat colonoscopy rec in 5-10 years.  . Migraine   . Nevus   . Personal history of failed conscious sedation 08/11/2011  . Tachycardia   . Thyroid disease   . Tobacco abuse Jan 2014   quit    Past Surgical History:  Procedure Laterality Date  . CHOLECYSTECTOMY    . COLONOSCOPY  02/07/2010  . HYSTEROSCOPY    . LAPAROSCOPIC APPENDECTOMY N/A 09/23/2015   Procedure: APPENDECTOMY LAPAROSCOPIC;  Surgeon: Georganna Skeans, MD;  Location: [place];  Service: General;  Laterality: N/A;  . [place]   x 2 - In 1997, noted to have extensive pelvic adhesions and BL tubal obstruction  . UPPER GASTROINTESTINAL ENDOSCOPY  12/18/2009     reports that she quit smoking about 2 years ago. Her smoking use included cigarettes. She has a 42.00 pack-year smoking history. She has never used smokeless tobacco. She reports that she does not drink alcohol or use drugs. Social History   Socioeconomic History  . Marital status: Divorced    Spouse name: Not on file  . Number of children: Not on file  . Years of education: Not on file  . Highest education level: Not on file  Occupational History  . Not on file  Social Needs  . Financial resource strain: Not on file  . Food insecurity:    Worry: Not on file    Inability: Not on file  . Transportation needs:    Medical: Not on file    Non-medical: Not on file  Tobacco Use  . Smoking status: Former Smoker    Packs/day: 1.00    Years: 42.00    Pack years: 42.00    Types: Cigarettes    Last attempt to quit: 05/10/2015    Years since quitting: 2.2  . Smokeless tobacco: Never Used  . Tobacco comment: Counseling sheet 07-2011   Substance and Sexual Activity  . Alcohol use: No    Alcohol/week: 0.0 oz  . Drug use: No  . Sexual activity: Never  Lifestyle  . Physical activity:    Days per week: Not on file    Minutes per session: Not on file  . Stress: Not on file    Relationships  . Social connections:    Talks on phone: Not on file    Gets together: Not on file    Attends religious service: Not on file    Active member of club or organization: Not on file    Attends meetings of clubs or organizations: Not on file    Relationship status: Not on file  . Intimate partner violence:    Fear of current or ex partner: Not on file    Emotionally abused: Not on file    Physically abused: Not on file  Forced sexual activity: Not on file  Other Topics Concern  . Not on file  Social History Narrative         Financial assistance approved for 100% discount at [facility] and has [facility] card per Bonna Gains   01/15/2010      Diet- N/A   Caffeine- Goody powders   Married-Divorced   [employer] and lives with mother   Pets-2   Current/past profession-[employer]   Exercise-No   Living will-No   DNR-No   POA/HPOA-No          Family History  Problem Relation Age of Onset  . COPD Mother   . Heart disease Father        had 5 heart attacks, first at age 23s.  Marland Kitchen COPD Father     Allergies  Allergen Reactions  . Anoro Ellipta [Umeclidinium-Vilanterol] Other (See Comments)    Chest pain if used more than 3 consecutive days   . Codeine     REACTION: headaches  . Prozac [Fluoxetine Hcl] Other (See Comments)    Suicidal ideations    Outpatient Encounter Medications as of 07/28/2017  Medication Sig  . albuterol (PROAIR HFA) 108 (90 Base) MCG/ACT inhaler INHALE 2 PUFFS EVERY 6  HOURS AS NEEDED FOR  SHORTNESS OF BREATH OR  WHEEZING  . Aspirin-Acetaminophen-Caffeine 500-325-65 MG PACK Take 1 Package by mouth as needed.   Marland Kitchen atorvastatin (LIPITOR) 80 MG tablet TAKE ONE TABLET BY MOUTH  ONCE DAILY AT BEDTIME FOR  CHOLESTEROL  . glipiZIDE (GLUCOTROL XL) 5 MG 24 hr tablet Take 1 tablet (5 mg total) by mouth daily with breakfast.  . GLUCOSAMINE-CHONDROITIN-MSM PO Take 3 tablets by mouth daily.  Marland Kitchen ibuprofen (ADVIL,MOTRIN) 200 MG tablet Take 600 mg by mouth as needed for  moderate pain.   Marland Kitchen ipratropium-albuterol (DUONEB) 0.5-2.5 (3) MG/3ML SOLN Take 3 mLs by nebulization every 6 (six) hours as needed. For shortness of breath or wheezing  . Ketotifen Fumarate (REFRESH EYE ITCH RELIEF OP) Place 1 drop into both eyes daily as needed. For itchy eyes  . levothyroxine (SYNTHROID, LEVOTHROID) 175 MCG tablet Take 1 tablet (175 mcg total) by mouth daily before breakfast.  . lisinopril (PRINIVIL,ZESTRIL) 10 MG tablet Take One tablet by mouth once daily for blood pressure  . LORazepam (ATIVAN) 0.5 MG tablet TAKE 1 TABLET BY MOUTH 3  TIMES A DAY AS NEEDED FOR  ANXIETY OR NERVOUSNESS  . metoprolol tartrate (LOPRESSOR) 25 MG tablet TAKE ONE-HALF TABLET BY  MOUTH TWO TIMES DAILY  . omeprazole (PRILOSEC) 20 MG capsule TAKE 1 CAPSULE BY MOUTH  DAILY  . ONETOUCH DELICA LANCETS 10F MISC USE TWO TIMES DAILY  . ONETOUCH VERIO test strip CHECK BLOOD SUGAR TWO TIMES DAILY AND AS DIRECTED  . SPIRIVA HANDIHALER 18 MCG inhalation capsule PLACE 1 CAPSULE INTO  HANDIHALER AND INHALE THE  CONTENTS OF 1 CAPSULE VIA  HANDIHALER DAILY  . [DISCONTINUED] ipratropium (ATROVENT HFA) 17 MCG/ACT inhaler Inhale 2 puffs into the lungs 4 (four) times daily.   No facility-administered encounter medications on file as of 07/28/2017.     Review of Systems:  Review of Systems  Respiratory: Positive for shortness of breath.   Psychiatric/Behavioral: The patient is nervous/anxious.   All other systems reviewed and are negative.   Health Maintenance  Topic Date Due  . OPHTHALMOLOGY EXAM  03/19/2016  . PNEUMOCOCCAL POLYSACCHARIDE VACCINE (2) 05/20/2017  . HIV Screening  04/28/2018 (Originally 05/10/1970)  . PAP SMEAR  07/30/2017  . HEMOGLOBIN A1C  10/26/2017  . INFLUENZA VACCINE  11/12/2017  . MAMMOGRAM  11/20/2017  . FOOT EXAM  12/23/2017  . Fecal DNA (Cologuard)  03/10/2020  . TETANUS/TDAP  06/11/2021  . Hepatitis C Screening  Completed    Physical Exam: Vitals:   07/28/17 1034  BP: 118/70    Pulse: 79  Temp: 98.1 F (36.7 C)  TempSrc: Other (Comment)  SpO2: 95%  Weight: 204 lb 6.4 oz (92.7 kg)  Height: _0  (1.6 m)   Body mass index is 36.21 kg/m. Physical Exam  Constitutional: She is oriented to person, place, and time. She appears well-developed and well-nourished.  HENT:  Mouth/Throat: Oropharynx is clear and moist. No oropharyngeal exudate.  MMM; no oral thrush  Eyes: Pupils are equal, round, and reactive to light. No scleral icterus.  Neck: Neck supple. Carotid bruit is not present. No tracheal deviation present. No thyromegaly present.  Cardiovascular: Normal rate, regular rhythm, normal heart sounds and intact distal pulses. Exam reveals no gallop and no friction rub.  No murmur heard. No LE edema b/l. no calf TTP.   Pulmonary/Chest: Effort normal and breath sounds normal. No stridor. No respiratory distress. She has no wheezes. She has no rales.  Reduced BS b/l but no w/r/r  Abdominal: Soft. Normal appearance and bowel sounds are normal. She exhibits distension. She exhibits no mass. There is no hepatomegaly. There is tenderness (epigastic). There is no rigidity, no rebound and no guarding. No hernia.  Musculoskeletal: She exhibits edema and tenderness (right palmer gamglionic cyst).  Lymphadenopathy:    She has no cervical adenopathy.  Neurological: She is alert and oriented to person, place, and time.  Skin: Skin is warm and dry. No rash noted.  Psychiatric: She has a normal mood and affect. Her behavior is normal. Judgment and thought content normal.    Labs reviewed: Basic Metabolic Panel: Recent Labs    09/19/16 1512 12/23/16 1212 04/28/17 1156 06/16/17 1057  NA 140 140 143  --   K 4.5 4.0 4.2  --   CL 105 105 105  --   CO2 27 28 32  --   GLUCOSE 92 96 90  --   BUN _1 --   CREATININE 0.80 0.87 0.84  --   CALCIUM 9.4 9.2 9.7  --   TSH 0.81 0.47 13.53* 1.58   Liver Function Tests: Recent Labs    09/19/16 1512 12/23/16 1212  04/28/17 1156  AST  --  20 16  ALT _2 BILITOT  --  0.3 0.2  PROT  --  6.8 7.0   No results for input(s): LIPASE, AMYLASE in the last 8760 hours. No results for input(s): AMMONIA in the last 8760 hours. CBC: Recent Labs    04/28/17 1156  WBC 7.8  NEUTROABS 5,273  HGB 12.5  HCT 39.0  MCV 84.4  PLT 264   Lipid Panel: Recent Labs    09/19/16 1512 12/23/16 1212 04/28/17 1156  CHOL 148 117 139  HDL 49* 46* 48*  LDLCALC 73 53 72  TRIG 130 93 109  CHOLHDL 3.0 2.5 2.9   Lab Results  Component Value Date   HGBA1C 6.3 (H) 04/28/2017    Procedures since last visit: No results found.  Assessment/Plan   ICD-10-CM   1. Barrett's esophagus with dysplasia K22.719 BMP with eGFR([facility])    ALT  2. Type 2 diabetes mellitus with diabetic neuropathy, without long-term current use of insulin (HCC) E11.40 BMP with eGFR([facility])  Hemoglobin A1c  3. Hypertension associated with diabetes ([address] [address]) E11.59    I10   4. COPD with asthma ([place]) J44.9   5. Hypothyroidism due to acquired atrophy of thyroid E03.4 TSH  6. Hyperlipidemia associated with type 2 diabetes mellitus (HCC) E11.69 Lipid Panel   E78.5    will call with lab results   [place] ISSUES  Continue current medications as ordered  Follow up in 3 mos for HTN, DM, hyperlipidemia, thryoid   Kayren Holck S. Perlie Gold  [facility] [address] [place], [place] [address] [phone] Cell (Monday-Friday 8 AM - 5 PM) [phone] After 5 PM and follow prompts

## 2017-07-29 ENCOUNTER — Encounter: Payer: Self-pay | Admitting: Internal Medicine

## 2017-07-29 LAB — BASIC METABOLIC PANEL WITH GFR
BUN: 12 mg/dL (ref 7–25)
CHLORIDE: 106 mmol/L (ref 98–110)
CO2: 29 mmol/L (ref 20–32)
Calcium: 9.3 mg/dL (ref 8.6–10.4)
Creat: 0.81 mg/dL (ref 0.50–0.99)
GFR, Est African American: 90 mL/min/{1.73_m2} (ref >=60)
GFR, Est Non African American: 78 mL/min/{1.73_m2} (ref >=60)
Glucose, Bld: 109 mg/dL (ref 65–139)
Potassium: 4.2 mmol/L (ref 3.5–5.3)
SODIUM: 141 mmol/L (ref 135–146)

## 2017-07-29 LAB — LIPID PANEL
CHOLESTEROL: 111 mg/dL (ref <200)
HDL: 45 mg/dL — ABNORMAL LOW (ref >50)
LDL Cholesterol (Calc): 48 mg/dL (calc)
Non-HDL Cholesterol (Calc): 66 mg/dL (calc) (ref <130)
Total CHOL/HDL Ratio: 2.5 (calc) (ref <5.0)
Triglycerides: 95 mg/dL (ref <150)

## 2017-07-29 LAB — HEMOGLOBIN A1C
EAG (MMOL/L): 7.1 (calc)
HEMOGLOBIN A1C: 6.1 %{Hb} — AB (ref <5.7)
Mean Plasma Glucose: 128 (calc)

## 2017-07-29 LAB — ALT: ALT: 13 U/L (ref 6–29)

## 2017-07-29 LAB — TSH: TSH: 0.33 m[IU]/L — AB (ref 0.40–4.50)

## 2017-08-24 ENCOUNTER — Other Ambulatory Visit: Payer: Self-pay | Admitting: Internal Medicine

## 2017-08-31 ENCOUNTER — Other Ambulatory Visit: Payer: Self-pay | Admitting: Internal Medicine

## 2017-09-15 ENCOUNTER — Other Ambulatory Visit: Payer: Self-pay | Admitting: Internal Medicine

## 2017-09-15 DIAGNOSIS — E119 Type 2 diabetes mellitus without complications: Secondary | ICD-10-CM

## 2017-10-17 ENCOUNTER — Other Ambulatory Visit: Payer: Self-pay | Admitting: Internal Medicine

## 2017-10-20 DIAGNOSIS — J440 Chronic obstructive pulmonary disease with acute lower respiratory infection: Secondary | ICD-10-CM | POA: Diagnosis not present

## 2017-10-30 ENCOUNTER — Ambulatory Visit (INDEPENDENT_AMBULATORY_CARE_PROVIDER_SITE_OTHER): Payer: Medicare Other | Admitting: Internal Medicine

## 2017-10-30 ENCOUNTER — Encounter: Payer: Self-pay | Admitting: Internal Medicine

## 2017-10-30 VITALS — BP 118/62 | HR 73 | Temp 98.2°F | Ht 63.0 in | Wt 199.8 lb

## 2017-10-30 DIAGNOSIS — E669 Obesity, unspecified: Secondary | ICD-10-CM | POA: Diagnosis not present

## 2017-10-30 DIAGNOSIS — K22719 Barrett's esophagus with dysplasia, unspecified: Secondary | ICD-10-CM

## 2017-10-30 DIAGNOSIS — I10 Essential (primary) hypertension: Secondary | ICD-10-CM | POA: Diagnosis not present

## 2017-10-30 DIAGNOSIS — E785 Hyperlipidemia, unspecified: Secondary | ICD-10-CM | POA: Diagnosis not present

## 2017-10-30 DIAGNOSIS — E1140 Type 2 diabetes mellitus with diabetic neuropathy, unspecified: Secondary | ICD-10-CM

## 2017-10-30 DIAGNOSIS — J449 Chronic obstructive pulmonary disease, unspecified: Secondary | ICD-10-CM

## 2017-10-30 DIAGNOSIS — E1169 Type 2 diabetes mellitus with other specified complication: Secondary | ICD-10-CM

## 2017-10-30 DIAGNOSIS — E034 Atrophy of thyroid (acquired): Secondary | ICD-10-CM

## 2017-10-30 DIAGNOSIS — E1159 Type 2 diabetes mellitus with other circulatory complications: Secondary | ICD-10-CM

## 2017-10-30 DIAGNOSIS — Z79899 Other long term (current) drug therapy: Secondary | ICD-10-CM

## 2017-10-30 NOTE — Patient Instructions (Signed)
Continue current medications as ordered  Will call with lab results  Will call with connected care referral for specialist copay concerns  Follow up in 3 mos for DM, HTN, hyperlipidemia, COPD and thyroid

## 2017-10-30 NOTE — Progress Notes (Signed)
Patient ID: Grace [name], female   DOB: 07-18-1955, 62 y.o.   MRN: [id number]   Location:  [facility] OFFICE  Provider: DR Arletha Grippe  Code Status:  Goals of Care:  Advanced Directives 02/05/2017  Does Patient Have a Medical Advance Directive? No  Does patient want to make changes to medical advance directive? -  Would patient like information on creating a medical advance directive? Yes (MAU/Ambulatory/Procedural Areas - Information given)  Pre-existing out of facility DNR order (yellow form or pink MOST form) -     Chief Complaint  Patient presents with  . Medical Management of Chronic Issues    3 month follow-up on HTN, DM, hyperlipidemia, and thyroid  . Medication Refill    No refills needed today   . Immunizations    Discuss need for pneu23  . Medication Management    Discuss combivent vs albuterol. Abuterol ineffective at times   . Health Maintenance    Discuss need for pap smear   . Referral    C3 referral     HPI: Patient is a 62 y.o. female seen today for medical management of chronic diseases.  She feels like her breathing is getting worse and she would like to see a pulmonologist but is unable to afford a specialty copay. She uses inhalers as she is supposed to but do not feel they work as well. She attempts to avoid going outside when air quality is code orange. She has SOB with mild exertion.  increased stressors at home - she still has a hole in her roof from tree that fell on it but is unable to pay someone to fix it. She also is unable to pay her bills at home. She is really worried about her financial situation. She has a cat that is dying from feline leukemia.  grief reaction - over mother's death in 2014-12-22. Unchanged. She reports she is improving with less insomnia and anxiety. Takes melatonin 25m but it is ineffective. trazodone was ineffective. lorazepam helps better. No SI/HI. She is no longer seeing counselor through hospice.  COPD/asthma hx - she  reports recent exacerbation that "put me in the bed for a week". No changes. She gets SOB with mild exertion. She takes spiriva, proair. [facility] Anora caused chest pain but has worked the best of all previous inhalers. utibron helps but it is very expensive.   Hypothyroid - stable. TSH 0.33; Free T4 level 1. Takes Levothyroxine 175 mcg daily.  Tobacco abuse - she stopped smoking  HTN - controlled on lisinopril and metoprolol  DM - A1c 6.1%. She takes glipizide. No low BS reactions. CBGs 110-140s usually. Average 30 day 107. Urine microalbumin/Cr ratio 88 in Sept 2018. BS 100s. Rarely >150. She has neuropathy  Hyperlipidemia - stable on lipitor. LDL 48; HDL 45  Peripheral neuropathy - due to DM. Stable without meds. She stopped gabapentin  GERD - symptomatic on low dose omeprazole but it is "not too bad". She occasionally has epigastric pain that awakens from sleep and causes nausea/vomiting. Pain resolves after emesis. She has a hx barrett's esophagus and has not seen GI in > 1 yr. No hematemesis. She takes "liquid" acid indigestion" med that helps. cologuard was negative. She has not seen GI yet  Obesity - weight down 5 lbs intentionally since last ov (total 14 lbs since Oct 2018). She is on somewhat healthy diet. She has poor exercise tolerance. BMI 35.4  Past Medical History:  Diagnosis Date  . Abnormal vaginal bleeding   . Asthma   . Barrett's esophagus    with high grade dysplasia per endoscopy 05/2005 // followed by Dr. [name] ([facility])  . Chronic back pain   . COPD (chronic obstructive pulmonary disease) ([place])   . Diabetes mellitus without complication ([place])   . Duodenal ulcer disease    thought to be contributed at least partly by overuse of NSAIDs and salicylates (goody powdr)  . Endometrial polyp 03/2003   s/p resection in 03/2003. Path showing submucosal leiomyoma and benign  proliferative type endometrium  . Fibroid  uterus    s/p myomectomy x 2  . GERD (gastroesophageal reflux disease)   . H/O failed moderate sedation   . Hiatal hernia   . History of CVA (cerebrovascular accident) 10/2000  . 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 Hyperlipidemia   . Hypertension   . Hypothyroidism    with hx of multinodular goiter (noted on neck US in 11/2002 - showing diffuse nodularity and inhomogenous texture diffusely BL)  . Internal hemorrhoids    grade 2 per colonoscopy in 09/2006 - repeat colonoscopy rec in 5-10 years.  . Migraine   . Nevus   . Personal history of failed conscious sedation 08/11/2011  . Tachycardia   . Thyroid disease   . Tobacco abuse Jan 2014   quit    Past Surgical History:  Procedure Laterality Date  . CHOLECYSTECTOMY    . COLONOSCOPY  02/07/2010  . HYSTEROSCOPY    . LAPAROSCOPIC APPENDECTOMY N/A 09/23/2015   Procedure: APPENDECTOMY LAPAROSCOPIC;  Surgeon: Georganna Skeans, MD;  Location: [facility];  Service: General;  Laterality: N/A;  . [place]   x 2 - In 1997, noted to have extensive pelvic adhesions and BL tubal obstruction  . UPPER GASTROINTESTINAL ENDOSCOPY  12/18/2009     reports that she quit smoking about 2 years ago. Her smoking use included cigarettes. She has a 42.00 pack-year smoking history. She has never used smokeless tobacco. She reports that she does not drink alcohol or use drugs. Social History   Socioeconomic History  . Marital status: Divorced    Spouse name: Not on file  . Number of children: Not on file  . Years of education: Not on file  . Highest education level: Not on file  Occupational History  . Not on file  Social Needs  . Financial resource strain: Not on file  . Food insecurity:    Worry: Not on file    Inability: Not on file  . Transportation needs:    Medical: Not on file    Non-medical: Not on file  Tobacco Use  . Smoking status:  Former Smoker    Packs/day: 1.00    Years: 42.00    Pack years: 42.00    Types: Cigarettes    Last attempt to quit: 05/10/2015    Years since quitting: 2.4  . Smokeless tobacco: Never Used  . Tobacco comment: Counseling sheet 07-2011   Substance and Sexual Activity  . Alcohol use: No    Alcohol/week: 0.0 oz  . Drug use: No  . Sexual activity: Never  Lifestyle  . Physical activity:    Days per week: Not on file    Minutes per session: Not on file  . Stress: Not on file  Relationships  . Social connections:    Talks on phone: Not on file    Gets together: Not on file    Attends religious service: Not on file    Active member of club or organization: Not on file    Attends meetings of clubs or organizations: Not on file    Relationship status: Not on file  . Intimate partner violence:    Fear of current or ex partner: Not on file    Emotionally abused: Not on file    Physically abused: Not on file    Forced sexual activity: Not on file  Other Topics Concern  . Not on file  Social History Narrative         Financial assistance approved for 100% discount at [facility] and has [facility] card per Bonna Gains   01/15/2010      Diet- N/A   Caffeine- Goody powders   Married-Divorced   [employer] and lives with mother   Pets-2   Current/past profession-[employer]   Exercise-No   Living will-No   DNR-No   POA/HPOA-No          Family History  Problem Relation Age of Onset  . COPD Mother   . Heart disease Father        had 5 heart attacks, first at age 26s.  Marland Kitchen COPD Father     Allergies  Allergen Reactions  . Anoro Ellipta [Umeclidinium-Vilanterol] Other (See Comments)    Chest pain if used more than 3 consecutive days   . Codeine     REACTION: headaches  . Prozac [Fluoxetine Hcl] Other (See Comments)    Suicidal ideations    Outpatient Encounter Medications as of 10/30/2017  Medication Sig  . albuterol (PROAIR HFA) 108 (90 Base) MCG/ACT inhaler INHALE 2 PUFFS EVERY 6  HOURS AS  NEEDED FOR  SHORTNESS OF BREATH OR  WHEEZING  . Aspirin-Acetaminophen-Caffeine 500-325-65 MG PACK Take 1 Package by mouth as needed.   Marland Kitchen atorvastatin (LIPITOR) 80 MG tablet TAKE ONE TABLET BY MOUTH  ONCE DAILY AT BEDTIME FOR  CHOLESTEROL  . glipiZIDE (GLUCOTROL XL) 5 MG 24 hr tablet Take 1 tablet (5 mg total) by mouth daily with breakfast.  . GLUCOSAMINE-CHONDROITIN-MSM PO Take 3 tablets by mouth daily.  Marland Kitchen ibuprofen (ADVIL,MOTRIN) 200 MG tablet Take 600 mg by mouth as needed for moderate pain.   Mar[place] Kitchen ipratropium-albuterol (DUONEB) 0.5-2.5 (3) MG/3ML SOLN Take 3 mLs by nebulization every 6 (six) hours as needed. For shortness of breath or wheezing  . Ketotifen Fumarate (REFRESH EYE ITCH RELIEF OP) Place 1 drop into both eyes daily as needed. For itchy eyes  . levothyroxine (SYNTHROID, LEVOTHROID) 175 MCG tablet TAKE 1 TABLET BY MOUTH  DAILY BEFORE BREAKFAST  . lisinopril (PRINIVIL,ZESTRIL) 10 MG tablet TAKE 1 TABLET BY MOUTH ONCE DAILY FOR BLOOD PRESSURE  . LORazepam (ATIVAN) 0.5 MG tablet TAKE 1 TABLET BY MOUTH 3  TIMES A DAY AS NEEDED FOR  ANXIETY OR NERVOUSNESS  . metoprolol tartrate (LOPRESSOR) 25 MG tablet TAKE ONE-HALF TABLET BY  MOUTH TWICE A DAY  . omeprazole (PRILOSEC) 20 MG capsule TAKE 1 CAPSULE BY MOUTH  DAILY  . ONETOUCH DELICA LANCETS 18A MISC USE TWO TIMES DAILY  . ONETOUCH VERIO test strip CHECK BLOOD SUGAR TWO TIMES DAILY AND AS DIRECTED  . SPIRIVA HANDIHALER 18 MCG inhalation capsule PLACE 1 CAPSULE INTO  HANDIHALER AND INHALE THE  CONTENTS OF 1 CAPSULE VIA  HANDIHALER DAILY  . [DISCONTINUED] ipratropium (ATROVENT HFA) 17  MCG/ACT inhaler Inhale 2 puffs into the lungs 4 (four) times daily.   No facility-administered encounter medications on file as of 10/30/2017.     Review of Systems:  Review of Systems  Constitutional: Positive for fatigue.  Respiratory: Positive for shortness of breath.   Gastrointestinal: Positive for abdominal distention and abdominal pain.    Musculoskeletal: Positive for arthralgias.  All other systems reviewed and are negative.   Health Maintenance  Topic Date Due  . PNEUMOCOCCAL POLYSACCHARIDE VACCINE (2) 05/20/2017  . PAP SMEAR  07/30/2017  . HIV Screening  04/28/2018 (Originally 05/10/1970)  . OPHTHALMOLOGY EXAM  11/07/2017  . INFLUENZA VACCINE  11/12/2017  . MAMMOGRAM  11/20/2017  . HEMOGLOBIN A1C  01/27/2018  . FOOT EXAM  07/29/2018  . Fecal DNA (Cologuard)  03/10/2020  . TETANUS/TDAP  06/11/2021  . Hepatitis C Screening  Completed    Physical Exam: Vitals:   10/30/17 1133  BP: 118/62  Pulse: 73  Temp: 98.2 F (36.8 C)  TempSrc: Oral  SpO2: 96%  Weight: 199 lb 12.8 oz (90.6 kg)  Height: 5' 3"  (1.6 m)   Body mass index is 35.39 kg/m. Physical Exam  Constitutional: She is oriented to person, place, and time. She appears well-developed and well-nourished.  HENT:  Mouth/Throat: Oropharynx is clear and moist. No oropharyngeal exudate.  MMM; no oral thrush  Eyes: Pupils are equal, round, and reactive to light. No scleral icterus.  Neck: Neck supple. Carotid bruit is not present. No tracheal deviation present. No thyromegaly present.  Cardiovascular: Normal rate, regular rhythm and intact distal pulses. Exam reveals no gallop and no friction rub.  Murmur (1/6 SEM) heard. No LE edema b/l. no calf TTP.   Pulmonary/Chest: Effort normal. No stridor. No respiratory distress. She has decreased breath sounds (b/l). She has no wheezes. She has no rales.  Abdominal: Soft. Normal appearance and bowel sounds are normal. She exhibits no distension and no mass. There is no hepatomegaly. There is tenderness (RUQ). There is no rigidity, no rebound and no guarding. No hernia.  obese  Lymphadenopathy:    She has no cervical adenopathy.  Neurological: She is alert and oriented to person, place, and time. She has normal reflexes.  Skin: Skin is warm and dry. No rash noted.  Psychiatric: She has a normal mood and affect.  Her behavior is normal. Judgment and thought content normal.   Diabetic Foot Exam - Simple   Simple Foot Form Diabetic Foot exam was performed with the following findings:  Yes 10/30/2017 12:26 PM  Visual Inspection See comments:  Yes Sensation Testing Intact to touch and monofilament testing bilaterally:  Yes Pulse Check Posterior Tibialis and Dorsalis pulse intact bilaterally:  Yes See comments:  Yes Comments B/l bunion but no calluses or ulcerations; no foot lesions b/l.     Labs reviewed: Basic Metabolic Panel: Recent Labs    12/23/16 1212 04/28/17 1156 06/16/17 1057 07/28/17 1112  NA 140 143  --  141  K 4.0 4.2  --  4.2  CL 105 105  --  106  CO2 28 32  --  29  GLUCOSE 96 90  --  109  BUN 10 13  --  12  CREATININE 0.87 0.84  --  0.81  CALCIUM 9.2 9.7  --  9.3  TSH 0.47 13.53* 1.58 0.33*   Liver Function Tests: Recent Labs    12/23/16 1212 04/28/17 1156 07/28/17 1112  AST 20 16  --   ALT 21 14 13  BILITOT 0.3 0.2  --   PROT 6.8 7.0  --    No results for input(s): LIPASE, AMYLASE in the last 8760 hours. No results for input(s): AMMONIA in the last 8760 hours. CBC: Recent Labs    04/28/17 1156  WBC 7.8  NEUTROABS 5,273  HGB 12.5  HCT 39.0  MCV 84.4  PLT 264   Lipid Panel: Recent Labs    12/23/16 1212 04/28/17 1156 07/28/17 1112  CHOL 117 139 111  HDL 46* 48* 45*  LDLCALC 53 72 48  TRIG 93 109 95  CHOLHDL 2.5 2.9 2.5   Lab Results  Component Value Date   HGBA1C 6.1 (H) 07/28/2017    Procedures since last visit: No results found.  Assessment/Plan   ICD-10-CM   1. COPD with asthma ([place]) J44.9 Ambulatory referral to Connected Care  2. Obesity (BMI 35.0-39.9 without comorbidity) E66.9   3. Type 2 diabetes mellitus with diabetic neuropathy, without long-term current use of insulin (HCC) E11.40 CMP with eGFR([facility])    Hemoglobin A1c    Microalbumin/Creatinine Ratio, Urine  4. Hypertension associated with diabetes ([place]) E11.59 CMP with  eGFR([facility])   I10   5. Hyperlipidemia associated with type 2 diabetes mellitus (HCC) E11.69 Lipid Panel   E78.5   6. Hypothyroidism due to acquired atrophy of thyroid E03.4 TSH  7. Barrett's esophagus with dysplasia K22.719   8. High risk medication use Z79.899 CMP with eGFR([facility])   Continue current medications as ordered  Will call with lab results  Will call with connected care referral for specialist copay concerns  Follow up in 3 mos for DM, HTN, hyperlipidemia, COPD and thyroid    Romaldo Saville S. Perlie Gold  [facility] [address] [place], [place] [address] [phone] Cell (Monday-Friday 8 AM - 5 PM) [phone] After 5 PM and follow prompts

## 2017-10-31 LAB — COMPLETE METABOLIC PANEL WITH GFR
AG RATIO: 1.5 (calc) (ref 1.0–2.5)
ALBUMIN MSPROF: 4.1 g/dL (ref 3.6–5.1)
ALKALINE PHOSPHATASE (APISO): 89 U/L (ref 33–130)
ALT: 15 U/L (ref 6–29)
AST: 19 U/L (ref 10–35)
BUN: 16 mg/dL (ref 7–25)
CHLORIDE: 106 mmol/L (ref 98–110)
CO2: 26 mmol/L (ref 20–32)
CREATININE: 0.85 mg/dL (ref 0.50–0.99)
Calcium: 9.3 mg/dL (ref 8.6–10.4)
GFR, Est African American: 85 mL/min/{1.73_m2} (ref >=60)
GFR, Est Non African American: 73 mL/min/{1.73_m2} (ref >=60)
Globulin: 2.8 g/dL (calc) (ref 1.9–3.7)
Glucose, Bld: 61 mg/dL — ABNORMAL LOW (ref 65–99)
POTASSIUM: 4.5 mmol/L (ref 3.5–5.3)
Sodium: 140 mmol/L (ref 135–146)
Total Bilirubin: 0.3 mg/dL (ref 0.2–1.2)
Total Protein: 6.9 g/dL (ref 6.1–8.1)

## 2017-10-31 LAB — MICROALBUMIN / CREATININE URINE RATIO
CREATININE, URINE: 153 mg/dL (ref 20–275)
MICROALB UR: 16.4 mg/dL
MICROALB/CREAT RATIO: 107 ug/mg{creat} — AB (ref <30)

## 2017-10-31 LAB — LIPID PANEL
CHOL/HDL RATIO: 2.5 (calc) (ref <5.0)
CHOLESTEROL: 122 mg/dL (ref <200)
HDL: 48 mg/dL — ABNORMAL LOW (ref >50)
LDL Cholesterol (Calc): 56 mg/dL (calc)
Non-HDL Cholesterol (Calc): 74 mg/dL (calc) (ref <130)
Triglycerides: 98 mg/dL (ref <150)

## 2017-10-31 LAB — HEMOGLOBIN A1C
HEMOGLOBIN A1C: 6.1 %{Hb} — AB (ref <5.7)
Mean Plasma Glucose: 128 (calc)
eAG (mmol/L): 7.1 (calc)

## 2017-10-31 LAB — TSH: TSH: 0.48 mIU/L (ref 0.40–4.50)

## 2017-11-01 ENCOUNTER — Other Ambulatory Visit: Payer: Self-pay | Admitting: Internal Medicine

## 2017-11-03 ENCOUNTER — Other Ambulatory Visit: Payer: Self-pay | Admitting: Internal Medicine

## 2017-11-03 DIAGNOSIS — E119 Type 2 diabetes mellitus without complications: Secondary | ICD-10-CM

## 2017-11-05 ENCOUNTER — Other Ambulatory Visit: Payer: Self-pay | Admitting: Internal Medicine

## 2017-11-05 ENCOUNTER — Other Ambulatory Visit: Payer: Self-pay | Admitting: Nurse Practitioner

## 2017-11-06 NOTE — Telephone Encounter (Signed)
Spoke with patient, patient states she requested renewal for control solution for blood sugar machine in error   Refill refused

## 2017-11-06 NOTE — Telephone Encounter (Signed)
[place] Database verified and compliance confirmed, last filled 08/29/17  Routing to doctor to authorize due to dispense number #[id number]

## 2017-11-06 NOTE — Telephone Encounter (Signed)
Pt may not receive for a couple of weeks due to mail order.

## 2017-11-09 ENCOUNTER — Other Ambulatory Visit: Payer: Self-pay | Admitting: Internal Medicine

## 2017-11-09 DIAGNOSIS — Z1231 Encounter for screening mammogram for malignant neoplasm of breast: Secondary | ICD-10-CM

## 2017-11-11 ENCOUNTER — Ambulatory Visit
Admission: RE | Admit: 2017-11-11 | Discharge: 2017-11-11 | Disposition: A | Discharge status: Home / Self Care | Payer: Medicare Other | Source: Ambulatory Visit | Attending: Internal Medicine | Admitting: Internal Medicine

## 2017-11-11 DIAGNOSIS — Z1231 Encounter for screening mammogram for malignant neoplasm of breast: Secondary | ICD-10-CM | POA: Diagnosis not present

## 2017-11-12 ENCOUNTER — Other Ambulatory Visit: Payer: Self-pay | Admitting: Internal Medicine

## 2017-11-12 DIAGNOSIS — R928 Other abnormal and inconclusive findings on diagnostic imaging of breast: Secondary | ICD-10-CM

## 2017-11-17 ENCOUNTER — Other Ambulatory Visit: Payer: Medicare Other

## 2017-12-02 ENCOUNTER — Encounter: Payer: Self-pay | Admitting: Internal Medicine

## 2018-01-04 ENCOUNTER — Other Ambulatory Visit: Payer: Self-pay | Admitting: Internal Medicine

## 2018-01-04 ENCOUNTER — Other Ambulatory Visit: Payer: Self-pay

## 2018-01-04 NOTE — Patient Outreach (Signed)
[facility]) Care Management  01/04/2018  BRIDGID PRINTZ Grace [name], Grace [name] [id number]   Telephone Screen  Referral Date: 01/04/18 Referral Source: MD office Referral Reason: "patient needs to see pulmonary for worsening COPD but unable to afford co pays, co pay is $35-50 and insurance covers 80% patient doesn't quality for Medicaid -receives $1124/monthly" Insurance: [employer]   Voicemail message received from patient returning RN CM call. Return call placed to patient. Screening completed.  Social: Patient resides in her home alone. She voices that she is independent with ADLs/IADLs. Denies nay recent falls. Patient drives herself to medical appts. She voices that she makes too much money to qualify for Medicaid. She is enrolled in her electric company's 'equalized electric bill Payment program" where she pays he same amount of money per month for electricity. She voices that she is very conservative with her water usage and keeps her bill under $30/month. Patient voices that she gets about $15/month in food stamps which is not enough. She admits that she does not always eat healthy as she is unable to afford it. Patient interested in further community resources such as food banks/pantries to help her with better food options.  Conditions: Pr chart review, patient has PMH of HTN,DM(A1C 6.1), GERD, COPD, obesity and tobacco abuse. Patient denies needing further education and/or support in managing her conditions. She voices she knows what to do. She states her main issue is that she continues to smoke which is making her COPD worse. She would like to see a pulmonologist but unable to afford co pays. Advised patient that due to Medicare guidelines [facility] unable to assist with any types of copays to MDs. She reported that was her major need at this time.  Medications: Patient voices she is taking about nine meds. She using [facility] for mail order meds. Patient states that she gets all  her meds free except for her inhalers. She is paying about $8.50 per inhaler for a four month supply which she knows is a very reasonable and affordable price. She manages her meds on her own and denies needing any further assistance.   Appointments: Patient followed by PCP and sees regularly.   Consent: [facility] services reviewed and discussed with patient. She is agreeable to SW assistance for possible community resources ut denies needing any other services.   Plan: RN CM will send [facility] SW referral for possible community resources on food.   Enzo Montgomery, RN,BSN,CCM [place] Management Telephonic Care Management Coordinator Direct Phone: [phone] Toll Free: [phone] Fax: [phone]

## 2018-01-04 NOTE — Patient Outreach (Signed)
[facility]) Care Management  01/04/2018  Grace [name] [birth year] [id number]   Telephone Screen  Referral Date: 01/04/18 Referral Source: MD office Referral Reason: "patient needs to see pulmonary for worsening COPD but unable to afford copays, copay is $35-50 and insurance covers 80% patient doesn't quality for Medicaid -receives $1124/monthly Insurance: [facility] Medicare     Outreach attempt # 1 to patient. No answer at present. RN CM left HIPAA compliant voicemail message along with contact info.     Plan: RN CM will make outreach attempt to patient within 3-4 business days. RN CM will send unsuccessful outreach letter to patient.   Enzo Montgomery, RN,BSN,CCM [place] Management Telephonic Care Management Coordinator Direct Phone: [phone] Toll Free: [phone] Fax: [phone]

## 2018-01-05 ENCOUNTER — Other Ambulatory Visit: Payer: Self-pay | Admitting: Internal Medicine

## 2018-01-05 DIAGNOSIS — E1140 Type 2 diabetes mellitus with diabetic neuropathy, unspecified: Secondary | ICD-10-CM

## 2018-01-06 ENCOUNTER — Other Ambulatory Visit: Payer: Self-pay

## 2018-01-06 NOTE — Patient Outreach (Signed)
[facility]) Care Management  01/06/2018  Grace [name] 13-Apr-1956 [id number]  BSW attempted to contact the patient on today's date to conduct a community resource consult. Unfortunately, today's call was unsuccessful. BSW left the patient a HIPAA compliant voice message requesting a return call.   Plan: BSW will mail the patient an unsuccessful outreach letter. BSW will attempt the patient again within the next four business days.  [name], BSW, CDP [facility] [phone]

## 2018-01-08 ENCOUNTER — Telehealth: Payer: Self-pay

## 2018-01-08 ENCOUNTER — Other Ambulatory Visit: Payer: Self-pay

## 2018-01-08 NOTE — Telephone Encounter (Signed)
Incoming mail received from [facility] indication they made several attempts to contact patient for additional diagnostic evaluation including a certified letter.   Per Dr.Carter if we can follow-up with patient to assist [facility] in making contact with patient.  Left message on voicemail for patient to return call when available, reason for call: patient needs to call the [facility] at [phone]  S.Chrae B/CMA

## 2018-01-08 NOTE — Telephone Encounter (Signed)
Spoke with patient. Patient is aware that she needs additional diagnostic images yet due to financial strain and stress she is unable to. Patient states she would like to get these additional studies she just can not afford her portion. Patient began to cry as an emotional reaction to her current financial state.  Patient then proceed to ask for a sample of Anoro Ellipta. I reminded patient that this medication is listed on her allergies/contraindiction list. Patient advised that she only has problems with Anoro if she uses it 3 consecutive days.   Patient states she is aware that she needs to see a pulmonologist yet she is unable to afford that at this time.   Patient would like something similar to Anoro if Dr.Carter will not approve for her to have a sample of it.   * We do not have any samples of Anoro   Please advise

## 2018-01-08 NOTE — Telephone Encounter (Signed)
Patient aware of Dr.Carter's response

## 2018-01-08 NOTE — Telephone Encounter (Signed)
Noted.  We do not have any samples of meds that are similar to Anoro at this time.

## 2018-01-08 NOTE — Patient Outreach (Signed)
[facility]) Care Management  01/08/2018  Grace [name] [birth year] [id number]  BSW received a voice message from the patient. BSW returned patients call, unfortunately the returned call was unsuccessful. BSW will attempt a third and final outreach within the next four business days.  [name], BSW, CDP [facility] [phone]

## 2018-01-11 ENCOUNTER — Other Ambulatory Visit: Payer: Self-pay

## 2018-01-11 NOTE — Patient Outreach (Signed)
[facility]) Care Management  01/11/2018  Grace [name] July 18, 1955 [id number]  Successful call to the patient on today's date, HIPAA identifiers confirmed. BSW introduced self to the patient and the reason for today's call, indicating the patient had been referred for assistance with food insecurities. The patient confirms she receives $15 per month in SNAP benefits. The patient reports not buying meat "for quite some time" due to an inability to afford.  The patient has 8 cats and 1 dog in which she cares for in the home. The patient lives alone and reports a monthly income just above the Medicaid eligibility threshold. The patient states after paying bills including car payment, home owners insurance, life insurance, and all other household bills there is little left over for food. The patient further reports the need to be seen by a pulmonologist as well as pursue follow up studies due to changes in her right breast. Unfortunately, the patient is unable to afford her co-pays and has no intention of pursuing needed tests.  BSW spoke with patient about local food pantries. Patient is agreeable to BSW mailing "The little Blue Book" to the patients home for more information. BSW also spoke with the patient about applying for Medicaid to see if she would qualify for her Medicare premium costs to be covered. The patient is agreeable to BSW mailing a Medicaid application to her home.  BSW discussed the COPD program available as part of her [facility]) benefit. The patient does not seem very interested in this program as her main goal is to obtain new inhalers which is why she feels she needs a pulmonologist. BSW also spoke with the patient about [employer] program covered by some [facility] plans. The patient is agreeable to BSW placing a referral to [facility] which will prompt a RN contact the patient for discuss available programs the patient may benefit from.  Plan:  BSW to contact the patient in the next two weeks to confirm receipt of mailing and continue assisting with resources as needed.  [name], BSW, CDP [facility] [phone]

## 2018-01-12 ENCOUNTER — Ambulatory Visit: Payer: Medicare Other

## 2018-01-13 ENCOUNTER — Ambulatory Visit: Payer: Medicare Other

## 2018-01-14 ENCOUNTER — Telehealth: Payer: Self-pay | Admitting: *Deleted

## 2018-01-14 NOTE — Telephone Encounter (Signed)
Patient called and stated that she wants something for neuropathy in right hand. Stated that it is numb and painful. Sometimes it is so bad she can't pick things up. Stated that she is taking Vitamin B12. Everyday numbness.  Does not want to schedule an appointment stated that she already has one for 01/22/2018. Please Advise.

## 2018-01-14 NOTE — Telephone Encounter (Signed)
Gabapentin is no longer on her med list. She needs to be seen in order to have numbness evaluated

## 2018-01-14 NOTE — Telephone Encounter (Signed)
Patient notified and agreed. Offered sooner appointment but patient stated that she will keep her 01/22/18 appointment.

## 2018-01-19 ENCOUNTER — Other Ambulatory Visit: Payer: Self-pay | Admitting: Internal Medicine

## 2018-01-21 ENCOUNTER — Other Ambulatory Visit: Payer: Self-pay

## 2018-01-21 NOTE — Patient Outreach (Signed)
[facility]) Care Management  01/21/2018  Grace [name] 1955-12-31 [id number]  Successful outreach to the patient on today's date, HIPAA identifiers confirmed. The patient confirms receipt of resources but states "I haven't had time to go through it yet". The patient is agreeable to this BSW contacting next week to assist with any questions surrounding the Medicaid application. An appointment was arranged for Thursday 10/17 to allow the patient time to review.  During today's call BSW informed the patient that unfortunately her plan does not cover the "solutions for caregivers" program previously discussed. BSW also updated the patient that a representative with [facility] informed this BSW there was no program to assist with cost of co-pays. The patient stated understanding.   BSW also asked the patient if she had been in communication with the Breast Center to notify them the reason she has not returned for diagnostic testing was due to cost. BSW discussed possible grants or funding available to assist. The patient reports she did call and told a representative she was unable to afford tests. The patient states she was offered the option to establish a payment plan. Unfortunately, the patient denies the ability to afford any extra monthly costs. BSW encouraged the patient to proceed with applying for Medicaid in hopes of alleviating some healthcare costs. The patient is in agreement with this plan.  [name], BSW, CDP [facility] [phone]

## 2018-01-22 ENCOUNTER — Ambulatory Visit (INDEPENDENT_AMBULATORY_CARE_PROVIDER_SITE_OTHER): Payer: Medicare Other | Admitting: Internal Medicine

## 2018-01-22 ENCOUNTER — Encounter: Payer: Self-pay | Admitting: Internal Medicine

## 2018-01-22 VITALS — BP 180/102 | HR 83 | Temp 97.8°F | Ht 63.0 in | Wt 193.0 lb

## 2018-01-22 DIAGNOSIS — K22719 Barrett's esophagus with dysplasia, unspecified: Secondary | ICD-10-CM | POA: Diagnosis not present

## 2018-01-22 DIAGNOSIS — E785 Hyperlipidemia, unspecified: Secondary | ICD-10-CM

## 2018-01-22 DIAGNOSIS — E1169 Type 2 diabetes mellitus with other specified complication: Secondary | ICD-10-CM

## 2018-01-22 DIAGNOSIS — Z23 Encounter for immunization: Secondary | ICD-10-CM

## 2018-01-22 DIAGNOSIS — I10 Essential (primary) hypertension: Secondary | ICD-10-CM

## 2018-01-22 DIAGNOSIS — J449 Chronic obstructive pulmonary disease, unspecified: Secondary | ICD-10-CM

## 2018-01-22 DIAGNOSIS — E1159 Type 2 diabetes mellitus with other circulatory complications: Secondary | ICD-10-CM | POA: Diagnosis not present

## 2018-01-22 MED ORDER — METOPROLOL TARTRATE 25 MG PO TABS: ORAL_TABLET | ORAL | 1 refills | Status: DC

## 2018-01-22 MED ORDER — ATORVASTATIN CALCIUM 80 MG PO TABS: ORAL_TABLET | ORAL | 3 refills | Status: DC

## 2018-01-22 MED ORDER — OMEPRAZOLE 20 MG PO CPDR: 20.0000 mg | DELAYED_RELEASE_CAPSULE | Freq: Every day | ORAL | 3 refills | Status: DC

## 2018-01-22 MED ORDER — LISINOPRIL 10 MG PO TABS: ORAL_TABLET | ORAL | 3 refills | Status: DC

## 2018-01-22 MED ORDER — TIOTROPIUM BROMIDE MONOHYDRATE 18 MCG IN CAPS: ORAL_CAPSULE | RESPIRATORY_TRACT | 3 refills | Status: DC

## 2018-01-22 NOTE — Patient Instructions (Addendum)
CHANGE METOPROLOL TO 1 TABLET IN THE MORNING AND 1/2 TABLET IN THE EVENING for blood pressure; keep BP diary and call office if blood pressure remains >150/90 (GOAL BP < 140/80)  RECOMMEND TRIAL OF CBD OIL FOR NEUROPATHY/CARPAL TUNNEL  Continue other medications as ordered  Flu shot given today  RECOMMEND CARPAL TUNNEL BRACE TO RIGHT WRIST FOR CARPAL TUNNEL PAIN - use during the day and take off at night.  May need orthopedic eval +/- NCS/EMG if carpal tunnel pain does not improve or gets worse  Follow up in 3 mos with Grace [name] for routine visit. Fasting labs prior to appt   Carpal Tunnel Syndrome Carpal tunnel syndrome is a condition that causes pain in your hand and arm. The carpal tunnel is a narrow area that is on the palm side of your wrist. Repeated wrist motion or certain diseases may cause swelling in the tunnel. This swelling can pinch the main nerve in the wrist (median nerve). Follow these instructions at home: If you have a splint:  Wear it as told by your doctor. Remove it only as told by your doctor.  Loosen the splint if your fingers: ? Become numb and tingle. ? Turn blue and cold.  Keep the splint clean and dry. General instructions  Take over-the-counter and prescription medicines only as told by your doctor.  Rest your wrist from any activity that may be causing your pain. If needed, talk to your employer about changes that can be made in your work, such as getting a wrist pad to use while typing.  If directed, apply ice to the painful area: ? Put ice in a plastic bag. ? Place a towel between your skin and the bag. ? Leave the ice on for 20 minutes, 2-3 times per day.  Keep all follow-up visits as told by your doctor. This is important.  Do any exercises as told by your doctor, physical therapist, or occupational therapist. Contact a doctor if:  You have new symptoms.  Medicine does not help your pain.  Your symptoms get worse. This information is not  intended to replace advice given to you by your health care provider. Make sure you discuss any questions you have with your health care provider. Document Released: 03/20/2011 Document Revised: 09/06/2015 Document Reviewed: 08/16/2014 Elsevier Interactive Patient Education  [employer].

## 2018-01-22 NOTE — Progress Notes (Signed)
Patient ID: Grace [name], female   DOB: 1956-01-16, 62 y.o.   MRN: [id number]   Location:  [facility] OFFICE  Provider: DR Arletha Grippe  Code Status:  Goals of Care:  Advanced Directives 02/05/2017  Does Patient Have a Medical Advance Directive? No  Does patient want to make changes to medical advance directive? -  Would patient like information on creating a medical advance directive? Yes (MAU/Ambulatory/Procedural Areas - Information given)  Pre-existing out of facility DNR order (yellow form or pink MOST form) -     Chief Complaint  Patient presents with  . Medical Management of Chronic Issues    63mth follow-up    HPI: Patient is a 62 y.o. female seen today for medical management of chronic diseases.    increased stressors at home - she still has a hole in her roof from tree that fell on it but is unable to pay someone to fix it. She also is unable to pay her bills at home. She is really worried about her financial situation. She has a cat that is dying from feline leukemia.  grief reaction - over mother's death in 2014-12-21. Unchanged. She reports she is improving with less insomnia and anxiety. Takes melatonin 10mg  but it is ineffective. trazodone was ineffective. lorazepam helps better. No SI/HI. She is no longer seeing counselor through hospice.  COPD/asthma hx -  No changes. She gets SOB with mild exertion. She takes spiriva, proair. She completed a COPD study at [facility] in April 2017. Anora caused chest pain but has worked the best of all previous inhalers. utibron helps but it is very expensive.   Hypothyroidism - stable. TSH 0.48; Free T4 level 1. Takes Levothyroxine 175 mcg daily.  Tobacco abuse - she stopped smoking  HTN - controlled on lisinopril and metoprolol  DM - A1c 6.1%. She takes glipizide. No low BS reactions. CBGs 110-140s usually. Average 30 day 107. Urine microalbumin/Cr ratio 107 (prev 88). BS 100s. Rarely >150. She has neuropathy; LDL  56  Hyperlipidemia - stable on lipitor. LDL 56; HDL 48  Peripheral neuropathy - due to DM. Stable without meds. She stopped gabapentin as it caused HA  GERD - symptomatic on low dose omeprazole but it is "not too bad". She occasionally has epigastric pain that awakens from sleep and causes nausea/vomiting. Pain resolves after emesis. She has a hx barrett's esophagus and has not seen GI in > 1 yr. No hematemesis. She takes "liquid" acid indigestion" med that helps. cologuard was negative. She has not seen GI yet  Obesity - weight down 6 lbs intentionally since last ov (total 20 lbs since Oct 2018). She is on somewhat healthy diet. She has poor exercise tolerance. BMI 34.2  Past Medical History:  Diagnosis Date  . Abnormal vaginal bleeding   . Asthma   . Barrett's esophagus    with high grade dysplasia per endoscopy 05/2005 // followed by Dr. [name] ([facility])  . Chronic back pain   . COPD (chronic obstructive pulmonary disease) ([place])   . Diabetes mellitus without complication ([place])   . Duodenal ulcer disease    thought to be contributed at least partly by overuse of NSAIDs and salicylates (goody powdr)  . Endometrial polyp 03/2003   s/p resection in 03/2003. Path showing submucosal leiomyoma and benign  proliferative type endometrium  . Fibroid uterus    s/p myomectomy x 2  . GERD (gastroesophageal reflux disease)   . H/O failed moderate sedation   .  Hiatal hernia   . History of CVA (cerebrovascular accident) 10/2000  . 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 Hyperlipidemia   . Hypertension   . Hypothyroidism    with hx of multinodular goiter (noted on neck US in 11/2002 - showing diffuse nodularity and inhomogenous texture diffusely BL)  . Internal hemorrhoids    grade 2 per colonoscopy in 09/2006 - repeat colonoscopy rec in 5-10 years.  . Migraine   . Nevus   . Personal history of  failed conscious sedation 08/11/2011  . Tachycardia   . Thyroid disease   . Tobacco abuse Jan 2014   quit    Past Surgical History:  Procedure Laterality Date  . CHOLECYSTECTOMY    . COLONOSCOPY  02/07/2010  . HYSTEROSCOPY    . LAPAROSCOPIC APPENDECTOMY N/A 09/23/2015   Procedure: APPENDECTOMY LAPAROSCOPIC;  Surgeon: Georganna Skeans, MD;  Location: [facility];  Service: General;  Laterality: N/A;  . [place]   x 2 - In 1997, noted to have extensive pelvic adhesions and BL tubal obstruction  . UPPER GASTROINTESTINAL ENDOSCOPY  12/18/2009     reports that she quit smoking about 2 years ago. Her smoking use included cigarettes. She has a 42.00 pack-year smoking history. She has never used smokeless tobacco. She reports that she does not drink alcohol or use drugs. Social History   Socioeconomic History  . Marital status: Divorced    Spouse name: Not on file  . Number of children: Not on file  . Years of education: Not on file  . Highest education level: Not on file  Occupational History  . Not on file  Social Needs  . Financial resource strain: Not on file  . Food insecurity:    Worry: Not on file    Inability: Not on file  . Transportation needs:    Medical: Not on file    Non-medical: Not on file  Tobacco Use  . Smoking status: Former Smoker    Packs/day: 1.00    Years: 42.00    Pack years: 42.00    Types: Cigarettes    Last attempt to quit: 05/10/2015    Years since quitting: 2.7  . Smokeless tobacco: Never Used  . Tobacco comment: Counseling sheet 07-2011   Substance and Sexual Activity  . Alcohol use: No    Alcohol/week: 0.0 standard drinks  . Drug use: No  . Sexual activity: Never  Lifestyle  . Physical activity:    Days per week: Not on file    Minutes per session: Not on file  . Stress: Not on file  Relationships  . Social connections:    Talks on phone: Not on file    Gets together: Not on file    Attends religious service: Not on file    Active  member of club or organization: Not on file    Attends meetings of clubs or organizations: Not on file    Relationship status: Not on file  . Intimate partner violence:    Fear of current or ex partner: Not on file    Emotionally abused: Not on file    Physically abused: Not on file    Forced sexual activity: Not on file  Other Topics Concern  . Not on file  Social History Narrative         Financial assistance approved for 100%  discount at [facility] and has [facility] card per Bonna Gains   01/15/2010      Diet- N/A   Caffeine- Goody powders   Married-Divorced   [employer] and lives with mother   Pets-2   Current/past profession-[employer]   Exercise-No   Living will-No   DNR-No   POA/HPOA-No          Family History  Problem Relation Age of Onset  . COPD Mother   . Heart disease Father        had 5 heart attacks, first at age 31s.  Marland Kitchen COPD Father     Allergies  Allergen Reactions  . Anoro Ellipta [Umeclidinium-Vilanterol] Other (See Comments)    Chest pain if used more than 3 consecutive days   . Codeine     REACTION: headaches  . Prozac [Fluoxetine Hcl] Other (See Comments)    Suicidal ideations    Outpatient Encounter Medications as of 01/22/2018  Medication Sig  . albuterol (PROAIR HFA) 108 (90 Base) MCG/ACT inhaler INHALE 2 PUFFS EVERY 6  HOURS AS NEEDED FOR  SHORTNESS OF BREATH OR  WHEEZING  . Aspirin-Acetaminophen-Caffeine 500-325-65 MG PACK Take 1 Package by mouth as needed.   Marland Kitchen atorvastatin (LIPITOR) 80 MG tablet TAKE ONE TABLET BY MOUTH  ONCE DAILY AT BEDTIME FOR  CHOLESTEROL  . Biotin 5000 MCG CAPS Take by mouth daily.  Marland Kitchen glipiZIDE (GLUCOTROL XL) 5 MG 24 hr tablet TAKE 1 TABLET BY MOUTH  DAILY WITH BREAKFAST  . GLUCOSAMINE-CHONDROITIN-MSM PO Take 3 tablets by mouth daily.  Marland Kitchen ibuprofen (ADVIL,MOTRIN) 200 MG tablet Take 600 mg by mouth as needed for moderate pain.   Marland Kitchen ipratropium-albuterol (DUONEB) 0.5-2.5 (3) MG/3ML SOLN Take 3 mLs by nebulization every 6 (six) hours  as needed. For shortness of breath or wheezing  . Ketotifen Fumarate (REFRESH EYE ITCH RELIEF OP) Place 1 drop into both eyes daily as needed. For itchy eyes  . levothyroxine (SYNTHROID, LEVOTHROID) 175 MCG tablet TAKE 1 TABLET BY MOUTH  DAILY BEFORE BREAKFAST  . lisinopril (PRINIVIL,ZESTRIL) 10 MG tablet TAKE 1 TABLET BY MOUTH ONCE DAILY FOR BLOOD PRESSURE  . LORazepam (ATIVAN) 0.5 MG tablet TAKE 1 TABLET BY MOUTH 3  TIMES A DAY AS NEEDED FOR  ANXIETY OR NERVOUSNESS  . LUTEIN PO Take by mouth daily.  . metoprolol tartrate (LOPRESSOR) 25 MG tablet TAKE ONE-HALF TABLET BY  MOUTH TWICE A DAY  . omeprazole (PRILOSEC) 20 MG capsule TAKE 1 CAPSULE BY MOUTH  DAILY  . ONETOUCH DELICA LANCETS 16X MISC USE TWO TIMES DAILY  . ONETOUCH VERIO test strip CHECK BLOOD SUGAR TWO TIMES DAILY AND AS DIRECTED  . SPIRIVA HANDIHALER 18 MCG inhalation capsule PLACE 1 CAPSULE INTO  HANDIHALER AND INHALE THE  CONTENTS OF 1 CAPSULE VIA  HANDIHALER DAILY  . vitamin B-12 (CYANOCOBALAMIN) 1000 MCG tablet Take 1,000 mcg by mouth daily.  . [DISCONTINUED] ipratropium (ATROVENT HFA) 17 MCG/ACT inhaler Inhale 2 puffs into the lungs 4 (four) times daily.   No facility-administered encounter medications on file as of 01/22/2018.     Review of Systems:  Review of Systems  Respiratory: Positive for shortness of breath.   Musculoskeletal: Positive for arthralgias.  Neurological: Positive for numbness (in right hand).  Psychiatric/Behavioral: The patient is nervous/anxious.   All other systems reviewed and are negative.   Health Maintenance  Topic Date Due  . PAP SMEAR  07/30/2017  . OPHTHALMOLOGY EXAM  11/07/2017  . INFLUENZA VACCINE  11/12/2017  . HIV Screening  04/28/2018 (Originally 05/10/1970)  . HEMOGLOBIN A1C  05/02/2018  . FOOT EXAM  10/31/2018  . MAMMOGRAM  11/12/2019  . Fecal DNA (Cologuard)  03/10/2020  . TETANUS/TDAP  06/11/2021  . PNEUMOCOCCAL POLYSACCHARIDE VACCINE AGE 62-64 HIGH RISK  Completed  .  Hepatitis C Screening  Completed    Physical Exam: Vitals:   01/22/18 1121  BP: (!) 205/108  Pulse: 83  Temp: 97.8 F (36.6 C)  TempSrc: Oral  SpO2: 97%  Weight: 193 lb (87.5 kg)  Height: 5\' 3"  (1.6 m)   Body mass index is 34.19 kg/m. Physical Exam  Constitutional: She is oriented to person, place, and time. She appears well-developed and well-nourished.  HENT:  Mouth/Throat: Oropharynx is clear and moist. No oropharyngeal exudate.  MMM; no oral thrush  Eyes: Pupils are equal, round, and reactive to light. No scleral icterus.  Neck: Neck supple. Carotid bruit is not present. No tracheal deviation present. No thyromegaly present.  Cardiovascular: Normal rate, regular rhythm and intact distal pulses. Exam reveals no gallop and no friction rub.  Murmur (1/6 SEM) heard. Trace LE edema b/l; no calf TTP  Pulmonary/Chest: Effort normal. No stridor. No respiratory distress. She has decreased breath sounds (b/l). She has no wheezes. She has no rales.  Abdominal: Soft. Normal appearance and bowel sounds are normal. She exhibits no distension and no mass. There is no hepatomegaly. There is tenderness. There is no rigidity, no rebound and no guarding. No hernia.  obese  Musculoskeletal: She exhibits edema and tenderness.  (+) R>L Tinel's test  Lymphadenopathy:    She has no cervical adenopathy.  Neurological: She is alert and oriented to person, place, and time. She has normal reflexes.  Grip strength intact  Skin: Skin is warm and dry. No rash noted.  Psychiatric: Her behavior is normal. Judgment and thought content normal. Her mood appears anxious.   Diabetic Foot Exam - Simple   Simple Foot Form Diabetic Foot exam was performed with the following findings:  Yes 01/22/2018 12:49 PM  Visual Inspection See comments:  Yes Sensation Testing Intact to touch and monofilament testing bilaterally:  Yes Pulse Check Posterior Tibialis and Dorsalis pulse intact bilaterally:   Yes Comments B/l TTP bunion; no calluses or lesions      Labs reviewed: Basic Metabolic Panel: Recent Labs    04/28/17 1156 06/16/17 1057 07/28/17 1112 10/30/17 1241  NA 143  --  141 140  K 4.2  --  4.2 4.5  CL 105  --  106 106  CO2 32  --  29 26  GLUCOSE 90  --  109 61*  BUN 13  --  12 16  CREATININE 0.84  --  0.81 0.85  CALCIUM 9.7  --  9.3 9.3  TSH 13.53* 1.58 0.33* 0.48   Liver Function Tests: Recent Labs    04/28/17 1156 07/28/17 1112 10/30/17 1241  AST 16  --  19  ALT 14 13 15   BILITOT 0.2  --  0.3  PROT 7.0  --  6.9   No results for input(s): LIPASE, AMYLASE in the last 8760 hours. No results for input(s): AMMONIA in the last 8760 hours. CBC: Recent Labs    04/28/17 1156  WBC 7.8  NEUTROABS 5,273  HGB 12.5  HCT 39.0  MCV 84.4  PLT 264   Lipid Panel: Recent Labs    04/28/17 1156 07/28/17 1112 10/30/17 1241  CHOL 139 111 122  HDL 48* 45* 48*  LDLCALC 72 48 56  TRIG  109 95 98  CHOLHDL 2.9 2.5 2.5   Lab Results  Component Value Date   HGBA1C 6.1 (H) 10/30/2017    Procedures since last visit: No results found.  Assessment/Plan   ICD-10-CM   1. COPD with asthma ([place]) J44.9 tiotropium (SPIRIVA HANDIHALER) 18 MCG inhalation capsule  2. Barrett's esophagus with dysplasia K22.719 omeprazole (PRILOSEC) 20 MG capsule  3. Hypertension associated with diabetes ([place]) E11.59 lisinopril (PRINIVIL,ZESTRIL) 10 MG tablet   I10 metoprolol tartrate (LOPRESSOR) 25 MG tablet  4. Hyperlipidemia associated with type 2 diabetes mellitus (H[facility]) E11.69 atorvastatin (LIPITOR) 80 MG tablet   E78.5   5. Need for influenza vaccination Z23 Flu Vaccine QUAD 6+ mos PF IM (Fluarix Quad PF)   CHANGE METOPROLOL TO 1 TABLET IN THE MORNING AND 1/2 TABLET IN THE EVENING for blood pressure; keep BP diary and call office if blood pressure remains >150/90 (GOAL BP < 140/80)  RECOMMEND TRIAL OF CBD OIL FOR NEUROPATHY/CARPAL TUNNEL  Continue other medications as  ordered  Flu shot given today  RECOMMEND CARPAL TUNNEL BRACE TO RIGHT WRIST FOR CARPAL TUNNEL PAIN - use during the day and take off at night.  May need orthopedic eval +/- NCS/EMG if carpal tunnel pain does not improve or gets worse  Follow up in 3 mos with Janett Billow for routine visit. Fasting labs prior to appt (cmp, lipid panel, a1c, tsh)    Icis Budreau S. Perlie Gold  [facility] [address] [place], [place] [address] [phone] Cell (Monday-Friday 8 AM - 5 PM) [phone] After 5 PM and follow prompts

## 2018-01-28 ENCOUNTER — Ambulatory Visit: Payer: Self-pay

## 2018-01-28 ENCOUNTER — Other Ambulatory Visit: Payer: Self-pay

## 2018-01-28 NOTE — Patient Outreach (Signed)
[facility]) Care Management  01/28/2018  KALLA WATSON 1956-01-31 [id number]  Unsuccessful outreach attempt to the patient on today's date to review Medicaid application. BSW left a HIPAA compliant voice message requesting a return call.  Plan: BSW will attempt to outreach the patient within the next four business days if a return call is not received.  It is noted upon chart review the patients MD documented a hole in the patients roof from a falling tree. BSW will discuss this with the patient during the next outreach in efforts of linking the patient to available resources for assistance.  [name], BSW, CDP [facility] [phone]

## 2018-02-03 ENCOUNTER — Other Ambulatory Visit: Payer: Self-pay

## 2018-02-03 ENCOUNTER — Ambulatory Visit: Payer: Self-pay

## 2018-02-03 NOTE — Patient Outreach (Signed)
[facility]) Care Management  02/03/2018  DALAYNA LAUTER 1956-02-11 [id number]  BSW attempted to outreach the patient on today's date. BSW placed two call attempts to the patients home number [phone]). During both attempts the phone produced one ring then call was disconnected. BSW placed a call to the patients cell. BSW left a HIPAA compliant voice message requesting a return call.  Plan: BSW will attempt to outreach the patient within the next four business days.  [name], BSW, CDP [facility] [phone]

## 2018-02-04 ENCOUNTER — Other Ambulatory Visit: Payer: Self-pay
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
## 2018-02-09 ENCOUNTER — Ambulatory Visit: Payer: Self-pay

## 2018-02-12 ENCOUNTER — Other Ambulatory Visit: Payer: Self-pay

## 2018-02-12 NOTE — Patient Outreach (Signed)
[name]) Care Management  02/12/2018  Grace [name] 03/25/1956 [id number]  Successful outreach to the patient on today's date, HIPAA identifiers confirmed. BSW shared with patient that BSW received information from the breast center that she would not qualify for a scholarship as they could not provide to Medicare recipients. The patient stated understanding. The patient continues to state she is unable to establish a payment plan for follow up tests. BSW encouraged the patient to contact the Breast Center to determine what the overall cost would be prior to deciding it was unaffordable.  BSW informed the patient the referral placed through [place] showed [employer] accepted her home modification needs. The patient denies contact from this agency. BSW placed a call to [employer] to inquire next steps. BSW left a HIPAA compliant voice message for Grace [name] requesting a return call.  Plan: BSW to follow up with the patient in the next three weeks.  [name], BSW, CDP [facility] [phone]

## 2018-02-23 ENCOUNTER — Ambulatory Visit (INDEPENDENT_AMBULATORY_CARE_PROVIDER_SITE_OTHER): Payer: Medicare Other

## 2018-02-23 VITALS — BP 124/80 | HR 72 | Temp 98.1°F | Ht 63.0 in | Wt 192.0 lb

## 2018-02-23 DIAGNOSIS — Z0000 Encounter for general adult medical examination without abnormal findings: Secondary | ICD-10-CM

## 2018-02-23 DIAGNOSIS — Z598 Other problems related to housing and economic circumstances: Secondary | ICD-10-CM

## 2018-02-23 DIAGNOSIS — Z599 Problem related to housing and economic circumstances, unspecified: Secondary | ICD-10-CM

## 2018-02-23 MED ORDER — ZOSTER VAC RECOMB ADJUVANTED 50 MCG/0.5ML IM SUSR: 0.5000 mL | Freq: Once | INTRAMUSCULAR | 1 refills | Status: AC

## 2018-02-23 NOTE — Progress Notes (Addendum)
Subjective:   Grace [name] is a 62 y.o. female who presents for Medicare Annual (Subsequent) preventive examination.  Last AWV-05-06-55       Objective:     Vitals: BP 124/80 (BP Location: Left Arm, Patient Position: Sitting)   Pulse 72   Temp 98.1 F (36.7 C) (Oral)   Ht 5\' 3"  (1.6 m)   Wt 192 lb (87.1 kg)   LMP 02/27/2014   SpO2 95%   BMI 34.01 kg/m   Body mass index is 34.01 kg/m.  Advanced Directives 02/23/2018 02/05/2017 12/23/2016 09/19/2016 05/29/2016 05/21/2016 02/15/2016  Does Patient Have a Medical Advance Directive? No No No No No No No  Does patient want to make changes to medical advance directive? - - - - - - -  Would patient like information on creating a medical advance directive? Yes (MAU/Ambulatory/Procedural Areas - Information given) Yes (MAU/Ambulatory/Procedural Areas - Information given) Yes (MAU/Ambulatory/Procedural Areas - Information given) No - Patient declined - No - Patient declined No - patient declined information  Pre-existing out of facility DNR order (yellow form or pink MOST form) - - - - - - -    Tobacco Social History   Tobacco Use  Smoking Status Former Smoker  . Packs/day: 1.00  . Years: 42.00  . Pack years: 42.00  . Types: Cigarettes  . Last attempt to quit: 05/10/2015  . Years since quitting: 2.7  Smokeless Tobacco Never Used  Tobacco Comment   Counseling sheet 07-2011      Counseling given: Not Answered Comment: Counseling sheet 07-2011    Clinical Intake:  Pre-visit preparation completed: No  Pain : No/denies pain     Diabetes: Yes CBG done?: No Did pt. bring in CBG monitor from home?: No  How often do you need to have someone help you when you read instructions, pamphlets, or other written materials from your doctor or pharmacy?: 1 - Never What is the last grade level you completed in school?: GED  Interpreter Needed?: No  Information entered by :: Tyson Dense, RN  Past Medical History:  Diagnosis Date    . Abnormal vaginal bleeding   . Asthma   . Barrett's esophagus    with high grade dysplasia per endoscopy 05/2005 // followed by Dr. [name] ([facility])  . Chronic back pain   . COPD (chronic obstructive pulmonary disease) ([place])   . Diabetes mellitus without complication ([place])   . Duodenal ulcer disease    thought to be contributed at least partly by overuse of NSAIDs and salicylates (goody powdr)  . Endometrial polyp 03/2003   s/p resection in 03/2003. Path showing submucosal leiomyoma and benign  proliferative type endometrium  . Fibroid uterus    s/p myomectomy x 2  . GERD (gastroesophageal reflux disease)   . H/O failed moderate sedation   . Hiatal hernia   . History of CVA (cerebrovascular accident) 10/2000  . 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 Hyperlipidemia   . Hypertension   . Hypothyroidism    with hx of multinodular goiter (noted on neck US in 11/2002 - showing diffuse nodularity and inhomogenous texture diffusely BL)  . Internal hemorrhoids    grade 2 per colonoscopy in 09/2006 - repeat colonoscopy rec in 5-10 years.  . Migraine   . Nevus   . Personal history of failed conscious sedation 08/11/2011  .  Tachycardia   . Thyroid disease   . Tobacco abuse Jan 2014   quit   Past Surgical History:  Procedure Laterality Date  . CHOLECYSTECTOMY    . COLONOSCOPY  02/07/2010  . HYSTEROSCOPY    . LAPAROSCOPIC APPENDECTOMY N/A 09/23/2015   Procedure: APPENDECTOMY LAPAROSCOPIC;  Surgeon: Georganna Skeans, MD;  Location: [facility];  Service: General;  Laterality: N/A;  . [place]   x 2 - In 1997, noted to have extensive pelvic adhesions and BL tubal obstruction  . UPPER GASTROINTESTINAL ENDOSCOPY  12/18/2009   Family History  Problem Relation Age of Onset  . COPD Mother   . Heart disease Father        had 5 heart attacks, first at age 48s.  Marland Kitchen COPD Father    Social  History   Socioeconomic History  . Marital status: Divorced    Spouse name: Not on file  . Number of children: Not on file  . Years of education: Not on file  . Highest education level: Not on file  Occupational History  . Not on file  Social Needs  . Financial resource strain: Somewhat hard  . Food insecurity:    Worry: Never true    Inability: Never true  . Transportation needs:    Medical: No    Non-medical: No  Tobacco Use  . Smoking status: Former Smoker    Packs/day: 1.00    Years: 42.00    Pack years: 42.00    Types: Cigarettes    Last attempt to quit: 05/10/2015    Years since quitting: 2.7  . Smokeless tobacco: Never Used  . Tobacco comment: Counseling sheet 07-2011   Substance and Sexual Activity  . Alcohol use: No    Alcohol/week: 0.0 standard drinks  . Drug use: No  . Sexual activity: Never  Lifestyle  . Physical activity:    Days per week: 0 days    Minutes per session: 0 min  . Stress: Rather much  Relationships  . Social connections:    Talks on phone: Twice a week    Gets together: Twice a week    Attends religious service: More than 4 times per year    Active member of club or organization: Yes    Attends meetings of clubs or organizations: More than 4 times per year    Relationship status: Divorced  Other Topics Concern  . Not on file  Social History Narrative         Financial assistance approved for 100% discount at [facility] and has [facility] card per Bonna Gains   01/15/2010      Diet- N/A   Caffeine- Goody powders   Married-Divorced   [employer] and lives with mother   Pets-2   Current/past profession-[employer]   Exercise-No   Living will-No   DNR-No   POA/HPOA-No          Outpatient Encounter Medications as of 02/23/2018  Medication Sig  . albuterol (PROAIR HFA) 108 (90 Base) MCG/ACT inhaler INHALE 2 PUFFS EVERY 6  HOURS AS NEEDED FOR  SHORTNESS OF BREATH OR  WHEEZING  . Aspirin-Acetaminophen-Caffeine 500-325-65 MG PACK Take 1 Package by  mouth as needed.   Marland Kitchen atorvastatin (LIPITOR) 80 MG tablet TAKE ONE TABLET BY MOUTH  ONCE DAILY AT BEDTIME FOR  CHOLESTEROL  . Biotin 5000 MCG CAPS Take by mouth daily.  Marland Kitchen BIOTIN PO Take 500 mg by mouth daily.  Marland Kitchen glipiZIDE (GLUCOTROL XL) 5 MG 24 hr tablet  TAKE 1 TABLET BY MOUTH  DAILY WITH BREAKFAST  . GLUCOSAMINE-CHONDROITIN-MSM PO Take 3 tablets by mouth daily.  Marland Kitchen ibuprofen (ADVIL,MOTRIN) 200 MG tablet Take 600 mg by mouth as needed for moderate pain.   Marland Kitchen ipratropium-albuterol (DUONEB) 0.5-2.5 (3) MG/3ML SOLN Take 3 mLs by nebulization every 6 (six) hours as needed. For shortness of breath or wheezing  . Ketotifen Fumarate (REFRESH EYE ITCH RELIEF OP) Place 1 drop into both eyes daily as needed. For itchy eyes  . levothyroxine (SYNTHROID, LEVOTHROID) 175 MCG tablet TAKE 1 TABLET BY MOUTH  DAILY BEFORE BREAKFAST  . lisinopril (PRINIVIL,ZESTRIL) 10 MG tablet TAKE 1 TABLET BY MOUTH ONCE DAILY FOR BLOOD PRESSURE  . LORazepam (ATIVAN) 0.5 MG tablet TAKE 1 TABLET BY MOUTH 3  TIMES A DAY AS NEEDED FOR  ANXIETY OR NERVOUSNESS  . LUTEIN PO Take by mouth daily.  . metoprolol tartrate (LOPRESSOR) 25 MG tablet TAKE 1 TAB PO IN THE MORNING AND 1/2 TABLET IN THE EVENING for blood pressure  . Multiple Vitamins-Minerals (HEALTHY EYES/LUTEIN PO) Take 1 tablet by mouth daily.  Marland Kitchen omeprazole (PRILOSEC) 20 MG capsule Take 1 capsule (20 mg total) by mouth daily.  Glory Rosebush DELICA LANCETS 26R MISC USE TWO TIMES DAILY  . ONETOUCH VERIO test strip CHECK BLOOD SUGAR TWO TIMES DAILY AND AS DIRECTED  . tiotropium (SPIRIVA HANDIHALER) 18 MCG inhalation capsule PLACE 1 CAPSULE INTO  HANDIHALER AND INHALE THE  CONTENTS OF 1 CAPSULE VIA  HANDIHALER DAILY  . vitamin B-12 (CYANOCOBALAMIN) 1000 MCG tablet Take 1,000 mcg by mouth daily.  Marland Kitchen Zoster Vaccine Adjuvanted [facility]) injection Inject 0.5 mLs into the muscle once for 1 dose.  . [DISCONTINUED] Zoster Vaccine Adjuvanted [facility]) injection Inject 0.5 mLs into the muscle  once.  . [DISCONTINUED] ipratropium (ATROVENT HFA) 17 MCG/ACT inhaler Inhale 2 puffs into the lungs 4 (four) times daily.   No facility-administered encounter medications on file as of 02/23/2018.     Activities of Daily Living In your present state of health, do you have any difficulty performing the following activities: 02/23/2018  Hearing? N  Vision? N  Difficulty concentrating or making decisions? N  Walking or climbing stairs? N  Dressing or bathing? N  Doing errands, shopping? N  Preparing Food and eating ? N  Using the Toilet? N  In the past six months, have you accidently leaked urine? Y  Do you have problems with loss of bowel control? N  Managing your Medications? N  Managing your Finances? N  Housekeeping or managing your Housekeeping? N  Some recent data might be hidden    Patient Care Team: Gildardo Cranker, DO as PCP - General (Internal Medicine) [name] as [facility]    Assessment:   This is a routine wellness examination for Jannelly.  Exercise Activities and Dietary recommendations Current Exercise Habits: The patient does not participate in regular exercise at present, Exercise limited by: respiratory conditions(s)  Goals    . Blood Pressure < 140/90    . Exercise 3x per week (30 min per time)     Starting 02/15/16, I will attempt to start exercising 3 times per week, at home.     . Increase physical activity     Patient will call silver sneakers and try to exercise throughout the week    . LDL CALC < 100    . Quit smoking / using tobacco       Fall Risk Fall Risk  02/23/2018 07/28/2017 04/28/2017  02/05/2017 12/23/2016  Falls in the past year? 0 No No No No  Number falls in past yr: 0 - - - -  Injury with Fall? 0 - - - -   Is the patient's home free of loose throw rugs in walkways, pet beds, electrical cords, etc?   yes      Grab bars in the bathroom? no      Handrails on the stairs?   yes      Adequate lighting?    yes   Depression Screen PHQ 2/9 Scores 02/23/2018 01/04/2018 02/05/2017 09/19/2016  PHQ - 2 Score 6 0 5 0  PHQ- 9 Score 15 - 14 -     Cognitive Function MMSE - Mini Mental State Exam 02/15/2016  Orientation to time 5  Orientation to Place 5  Registration 3  Attention/ Calculation 5  Recall 3  Language- name 2 objects 2  Language- repeat 1  Language- follow 3 step command 3  Language- read & follow direction 1  Write a sentence 1  Copy design 1  Total score 30        Immunization History  Administered Date(s) Administered  . Influenza Split 04/25/2011  . Influenza Whole 02/06/2009  . Influenza, Seasonal, Injecte, Preservative Fre 05/20/2012  . Influenza,inj,Quad PF,6+ Mos 03/15/2013, 01/12/2015, 01/25/2016, 12/23/2016, 01/22/2018  . Influenza-Unspecified 12/13/2013  . Pneumococcal Polysaccharide-23 05/20/2012  . Tdap 06/12/2011  . Zoster 12/16/2015    Qualifies for Shingles Vaccine? Yes, educated and ordered to pharmacy  Screening Tests Health Maintenance  Topic Date Due  . PAP SMEAR  07/30/2017  . OPHTHALMOLOGY EXAM  11/07/2017  . HIV Screening  04/28/2018 (Originally 05/10/1970)  . HEMOGLOBIN A1C  05/02/2018  . FOOT EXAM  01/23/2019  . MAMMOGRAM  11/12/2019  . Fecal DNA (Cologuard)  03/10/2020  . TETANUS/TDAP  06/11/2021  . INFLUENZA VACCINE  Completed  . PNEUMOCOCCAL POLYSACCHARIDE VACCINE AGE 13-64 HIGH RISK  Completed  . Hepatitis C Screening  Completed    Cancer Screenings: Lung: Low Dose CT Chest recommended if Age 52-80 years, 30 pack-year currently smoking OR have quit w/in 15years. Patient does not qualify. Breast:  Up to date on Mammogram? Yes   Up to date of Bone Density/Dexa? Yes Colorectal: up to date  Additional Screenings:  Hepatitis C Screening: declined Diabetic eye exam due: patient said she will get it at [place]:    I have personally reviewed and addressed the Medicare Annual Wellness questionnaire and have noted the following  in the patient's chart:  A. Medical and social history B. Use of alcohol, tobacco or illicit drugs  C. Current medications and supplements D. Functional ability and status E.  Nutritional status F.  Physical activity G. Advance directives H. List of other physicians I.  Hospitalizations, surgeries, and ER visits in previous 12 months J.  [place] to include hearing, vision, cognitive, depression L. Referrals and appointments - C3 for counseling and financial help  In addition, I have reviewed and discussed with patient certain preventive protocols, quality metrics, and best practice recommendations. A written personalized care plan for preventive services as well as general preventive health recommendations were provided to patient.  See attached scanned questionnaire for additional information.   Signed,   Tyson Dense, RN Nurse Health Advisor  Patient Concerns: none

## 2018-02-23 NOTE — Patient Instructions (Signed)
Grace [name] , Thank you for taking time to come for your Medicare Wellness Visit. I appreciate your ongoing commitment to your health goals. Please review the following plan we discussed and let me know if I can assist you in the future.   Screening recommendations/referrals: Colonoscopy/cologuard up to date, due 03/10/2020 Mammogram up to date, due 11/12/2018 Bone Density up to date Recommended yearly ophthalmology/optometry visit for glaucoma screening and checkup Recommended yearly dental visit for hygiene and checkup  Vaccinations: Influenza vaccine up to date Pneumococcal vaccine due at age 32 Tdap vaccine up to date, due 06/11/2021 Shingles vaccine due, ordered to pharmacy    Advanced directives: Advance directive discussed with you today. I have provided a copy for you to complete at home and have notarized. Once this is complete please bring a copy in to our office so we can scan it into your chart.  Conditions/risks identified: none  Next appointment: Sherrie Mustache, NP 04/28/2018 @ 10:15am            Tyson Dense, RN 02/25/2019 @ 10:30am  Preventive Care 40-64 Years, Female Preventive care refers to lifestyle choices and visits with your health care provider that can promote health and wellness. What does preventive care include?  A yearly physical exam. This is also called an annual well check.  Dental exams once or twice a year.  Routine eye exams. Ask your health care provider how often you should have your eyes checked.  Personal lifestyle choices, including:  Daily care of your teeth and gums.  Regular physical activity.  Eating a healthy diet.  Avoiding tobacco and drug use.  Limiting alcohol use.  Practicing safe sex.  Taking low-dose aspirin daily starting at age 21.  Taking vitamin and mineral supplements as recommended by your health care provider. What happens during an annual well check? The services and screenings done by your health care  provider during your annual well check will depend on your age, overall health, lifestyle risk factors, and family history of disease. Counseling  Your health care provider may ask you questions about your:  Alcohol use.  Tobacco use.  Drug use.  Emotional well-being.  Home and relationship well-being.  Sexual activity.  Eating habits.  Work and work Statistician.  Method of birth control.  Menstrual cycle.  Pregnancy history. Screening  You may have the following tests or measurements:  Height, weight, and BMI.  Blood pressure.  Lipid and cholesterol levels. These may be checked every 5 years, or more frequently if you are over 62 years old.  Skin check.  Lung cancer screening. You may have this screening every year starting at age 56 if you have a 30-pack-year history of smoking and currently smoke or have quit within the past 15 years.  Fecal occult blood test (FOBT) of the stool. You may have this test every year starting at age 107.  Flexible sigmoidoscopy or colonoscopy. You may have a sigmoidoscopy every 5 years or a colonoscopy every 10 years starting at age 60.  Hepatitis C blood test.  Hepatitis B blood test.  Sexually transmitted disease (STD) testing.  Diabetes screening. This is done by checking your blood sugar (glucose) after you have not eaten for a while (fasting). You may have this done every 1-3 years.  Mammogram. This may be done every 1-2 years. Talk to your health care provider about when you should start having regular mammograms. This may depend on whether you have a family history of breast cancer.  BRCA-related  cancer screening. This may be done if you have a family history of breast, ovarian, tubal, or peritoneal cancers.  Pelvic exam and Pap test. This may be done every 3 years starting at age 74. Starting at age 56, this may be done every 5 years if you have a Pap test in combination with an HPV test.  Bone density scan. This is done  to screen for osteoporosis. You may have this scan if you are at high risk for osteoporosis. Discuss your test results, treatment options, and if necessary, the need for more tests with your health care provider. Vaccines  Your health care provider may recommend certain vaccines, such as:  Influenza vaccine. This is recommended every year.  Tetanus, diphtheria, and acellular pertussis (Tdap, Td) vaccine. You may need a Td booster every 10 years.  Zoster vaccine. You may need this after age 100.  Pneumococcal 13-valent conjugate (PCV13) vaccine. You may need this if you have certain conditions and were not previously vaccinated.  Pneumococcal polysaccharide (PPSV23) vaccine. You may need one or two doses if you smoke cigarettes or if you have certain conditions. Talk to your health care provider about which screenings and vaccines you need and how often you need them. This information is not intended to replace advice given to you by your health care provider. Make sure you discuss any questions you have with your health care provider. Document Released: 04/27/2015 Document Revised: 12/19/2015 Document Reviewed: 01/30/2015 Elsevier Interactive Patient Education  2017 [place] Prevention in the Home Falls can cause injuries. They can happen to people of all ages. There are many things you can do to make your home safe and to help prevent falls. What can I do on the outside of my home?  Regularly fix the edges of walkways and driveways and fix any cracks.  Remove anything that might make you trip as you walk through a door, such as a raised step or threshold.  Trim any bushes or trees on the path to your home.  Use bright outdoor lighting.  Clear any walking paths of anything that might make someone trip, such as rocks or tools.  Regularly check to see if handrails are loose or broken. Make sure that both sides of any steps have handrails.  Any raised decks and porches  should have guardrails on the edges.  Have any leaves, snow, or ice cleared regularly.  Use sand or salt on walking paths during winter.  Clean up any spills in your garage right away. This includes oil or grease spills. What can I do in the bathroom?  Use night lights.  Install grab bars by the toilet and in the tub and shower. Do not use towel bars as grab bars.  Use non-skid mats or decals in the tub or shower.  If you need to sit down in the shower, use a plastic, non-slip stool.  Keep the floor dry. Clean up any water that spills on the floor as soon as it happens.  Remove soap buildup in the tub or shower regularly.  Attach bath mats securely with double-sided non-slip rug tape.  Do not have throw rugs and other things on the floor that can make you trip. What can I do in the bedroom?  Use night lights.  Make sure that you have a light by your bed that is easy to reach.  Do not use any sheets or blankets that are too big for your bed. They should  not hang down onto the floor.  Have a firm chair that has side arms. You can use this for support while you get dressed.  Do not have throw rugs and other things on the floor that can make you trip. What can I do in the kitchen?  Clean up any spills right away.  Avoid walking on wet floors.  Keep items that you use a lot in easy-to-reach places.  If you need to reach something above you, use a strong step stool that has a grab bar.  Keep electrical cords out of the way.  Do not use floor polish or wax that makes floors slippery. If you must use wax, use non-skid floor wax.  Do not have throw rugs and other things on the floor that can make you trip. What can I do with my stairs?  Do not leave any items on the stairs.  Make sure that there are handrails on both sides of the stairs and use them. Fix handrails that are broken or loose. Make sure that handrails are as long as the stairways.  Check any carpeting to  make sure that it is firmly attached to the stairs. Fix any carpet that is loose or worn.  Avoid having throw rugs at the top or bottom of the stairs. If you do have throw rugs, attach them to the floor with carpet tape.  Make sure that you have a light switch at the top of the stairs and the bottom of the stairs. If you do not have them, ask someone to add them for you. What else can I do to help prevent falls?  Wear shoes that:  Do not have high heels.  Have rubber bottoms.  Are comfortable and fit you well.  Are closed at the toe. Do not wear sandals.  If you use a stepladder:  Make sure that it is fully opened. Do not climb a closed stepladder.  Make sure that both sides of the stepladder are locked into place.  Ask someone to hold it for you, if possible.  Clearly mark and make sure that you can see:  Any grab bars or handrails.  First and last steps.  Where the edge of each step is.  Use tools that help you move around (mobility aids) if they are needed. These include:  Canes.  Walkers.  Scooters.  Crutches.  Turn on the lights when you go into a dark area. Replace any light bulbs as soon as they burn out.  Set up your furniture so you have a clear path. Avoid moving your furniture around.  If any of your floors are uneven, fix them.  If there are any pets around you, be aware of where they are.  Review your medicines with your doctor. Some medicines can make you feel dizzy. This can increase your chance of falling. Ask your doctor what other things that you can do to help prevent falls. This information is not intended to replace advice given to you by your health care provider. Make sure you discuss any questions you have with your health care provider. Document Released: 01/25/2009 Document Revised: 09/06/2015 Document Reviewed: 05/05/2014 Elsevier Interactive Patient Education  2017 [employer].

## 2018-02-23 NOTE — Addendum Note (Signed)
Addended by: Tyson Dense E on: 02/23/2018 11:14 AM   Modules accepted: Orders

## 2018-03-01 ENCOUNTER — Other Ambulatory Visit: Payer: Self-pay

## 2018-03-01 ENCOUNTER — Ambulatory Visit: Payer: Self-pay

## 2018-03-01 NOTE — Patient Outreach (Signed)
[facility]) Care Management  03/01/2018  Grace [name] 03-20-1956 [id number]  Unsuccessful outreach to the patient on today's date in efforts to follow up on referral to community housing solutions. BSW left a HIPAA compliant voice message requesting a return call.  Plan: BSW to follow up with the patient in the next four days.  [name], BSW, CDP [facility] [place] [phone]

## 2018-03-02 LAB — HM DIABETES EYE EXAM

## 2018-03-03 ENCOUNTER — Other Ambulatory Visit: Payer: Self-pay

## 2018-03-03 NOTE — Patient Outreach (Signed)
[place] [facility]) Care Management  03/03/2018  Grace [name] 15-Sep-1955 [id number]  BSW received a voice message from the patient, BSW returned call to patient. HIPAA identifiers confirmed. The patient reports concerns surrounding ability to afford property tax. BSW provided the patient with the contact number to [employer] [facility]). BSW encouraged the patient to contact [facility] and schedule a meeting with the foreclosure prevention team. The patient stated understanding.  The patient denies contact by [employer] [facility]) regarding home modification referral. BSW outreached [employer] and left a detailed message requesting a return call. BSW acknowledges the patients referral was initially sent via [place] on 10/24 and was deemed "accepted" by Sharol Given on 10/25. Unfortunately, no outreach has occurred as of today's call.  Plan: BSW to await return call from [facility]. BSW to outreach the patient in the next month. The patient has this BSW's number and is aware she may call if assistance is needed prior to our next scheduled call.  [name], BSW, CDP [facility] [phone]

## 2018-03-04 ENCOUNTER — Ambulatory Visit (INDEPENDENT_AMBULATORY_CARE_PROVIDER_SITE_OTHER): Payer: Medicare Other | Admitting: Nurse Practitioner

## 2018-03-04 ENCOUNTER — Encounter: Payer: Self-pay | Admitting: Nurse Practitioner

## 2018-03-04 VITALS — BP 122/74 | HR 72 | Temp 97.9°F | Ht 63.0 in | Wt 190.0 lb

## 2018-03-04 DIAGNOSIS — Z598 Other problems related to housing and economic circumstances: Secondary | ICD-10-CM

## 2018-03-04 DIAGNOSIS — Z599 Problem related to housing and economic circumstances, unspecified: Secondary | ICD-10-CM

## 2018-03-04 DIAGNOSIS — J449 Chronic obstructive pulmonary disease, unspecified: Secondary | ICD-10-CM | POA: Diagnosis not present

## 2018-03-04 MED ORDER — TIOTROPIUM BROMIDE MONOHYDRATE 2.5 MCG/ACT IN AERS
2.0000 | INHALATION_SPRAY | Freq: Every day | RESPIRATORY_TRACT | 4 refills | Status: DC
Start: 2018-03-04 — End: 2018-03-25

## 2018-03-04 NOTE — Patient Instructions (Signed)
Ideally a pulmonary referral would be the most beneficial   To start Spiriva Respimat 2 puffs daily

## 2018-03-04 NOTE — Progress Notes (Signed)
Careteam: Patient Care Team: [name], NP as PCP - General (Geriatric Medicine) [name] as [place] Management  Advanced Directive information Does Patient Have a Medical Advance Directive?: No  Allergies  Allergen Reactions  . Anoro Ellipta [Umeclidinium-Vilanterol] Other (See Comments)    Chest pain if used more than 3 consecutive days   . Codeine     REACTION: headaches  . Gabapentin     headache  . Prozac [Fluoxetine Hcl] Other (See Comments)    Suicidal ideations    Chief Complaint  Patient presents with  . Acute Visit    Pt is being seen getting another inhaler besides spiriva handihaler due to it not working. Pt would like to try the spiriva respimat or something similar.      HPI: Patient is a 62 y.o. female seen in the office today to get another inhaler.  Pt with COPD with asthma.  takes spiriva, proair. She completed a COPD study at [facility] in April 2017. Anora caused chest pain but has worked the best of all previous inhalers. utibron helps but it is very expensive.  Using spiriva handihaler but wants respimat  Reports handihaler does not help her enough. Can not breath.  Was getting anora and it was "the best thing invented" did not use daily- used every 2-3 days and this would help her breath.  Using it daily caused chest pain but insurance would not cover.  Having a hard time doing anything due to the worsening of shortness of breath. Using nebulizer more often. Using albuterol every 6 hours since she has been off anora  Can not afford to go to the pulmonary specialist due to the co-pay.  Has been taking to [facility] to help with financials difficulties.   Request handicap placard due to shortness of breath however does not have recent PFT Review of Systems:  Review of Systems  Constitutional: Negative for chills, fever and malaise/fatigue.  Respiratory: Positive for cough, shortness of breath and wheezing.  Negative for sputum production.        With COPD, no acute changes noted.    Past Medical History:  Diagnosis Date  . Abnormal vaginal bleeding   . Asthma   . Barrett's esophagus    with high grade dysplasia per endoscopy 05/2005 // followed by Dr. [name] ([facility])  . Chronic back pain   . COPD (chronic obstructive pulmonary disease) ([place])   . Diabetes mellitus without complication ([place])   . Duodenal ulcer disease    thought to be contributed at least partly by overuse of NSAIDs and salicylates (goody powdr)  . Endometrial polyp 03/2003   s/p resection in 03/2003. Path showing submucosal leiomyoma and benign  proliferative type endometrium  . Fibroid uterus    s/p myomectomy x 2  . GERD (gastroesophageal reflux disease)   . H/O failed moderate sedation   . Hiatal hernia   . History of CVA (cerebrovascular accident) 10/2000  . 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 Hyperlipidemia   . Hypertension   . Hypothyroidism    with hx of multinodular goiter (noted on neck US in 11/2002 - showing diffuse nodularity and inhomogenous texture diffusely BL)  . Internal hemorrhoids    grade 2 per colonoscopy in 09/2006 - repeat colonoscopy rec in 5-10 years.  . Migraine   .  Nevus   . Personal history of failed conscious sedation 08/11/2011  . Tachycardia   . Thyroid disease   . Tobacco abuse Jan 2014   quit   Past Surgical History:  Procedure Laterality Date  . CHOLECYSTECTOMY    . COLONOSCOPY  02/07/2010  . HYSTEROSCOPY    . LAPAROSCOPIC APPENDECTOMY N/A 09/23/2015   Procedure: APPENDECTOMY LAPAROSCOPIC;  Surgeon: Georganna Skeans, MD;  Location: [place];  Service: General;  Laterality: N/A;  . [place]   x 2 - In 1997, noted to have extensive pelvic adhesions and BL tubal obstruction  . UPPER GASTROINTESTINAL ENDOSCOPY  12/18/2009   Social History:   reports that she quit smoking  about 2 years ago. Her smoking use included cigarettes. She has a 42.00 pack-year smoking history. She has never used smokeless tobacco. She reports that she does not drink alcohol or use drugs.  Family History  Problem Relation Age of Onset  . COPD Mother   . Heart disease Father        had 5 heart attacks, first at age 31s.  Marland Kitchen COPD Father     Medications: Patient's Medications  New Prescriptions   No medications on file  Previous Medications   ALBUTEROL (PROAIR HFA) 108 (90 BASE) MCG/ACT INHALER    INHALE 2 PUFFS EVERY 6  HOURS AS NEEDED FOR  SHORTNESS OF BREATH OR  WHEEZING   ASPIRIN-ACETAMINOPHEN-CAFFEINE 270-350-09 MG PACK    Take 1 Package by mouth as needed.    ATORVASTATIN (LIPITOR) 80 MG TABLET    TAKE ONE TABLET BY MOUTH  ONCE DAILY AT BEDTIME FOR  CHOLESTEROL   BIOTIN 5000 MCG CAPS    Take by mouth daily.   GLIPIZIDE (GLUCOTROL XL) 5 MG 24 HR TABLET    TAKE 1 TABLET BY MOUTH  DAILY WITH BREAKFAST   IBUPROFEN (ADVIL,MOTRIN) 200 MG TABLET    Take 600 mg by mouth as needed for moderate pain.    IPRATROPIUM-ALBUTEROL (DUONEB) 0.5-2.5 (3) MG/3ML SOLN    Take 3 mLs by nebulization every 6 (six) hours as needed. For shortness of breath or wheezing   KETOTIFEN FUMARATE (REFRESH EYE ITCH RELIEF OP)    Place 1 drop into both eyes daily as needed. For itchy eyes   LEVOTHYROXINE (SYNTHROID, LEVOTHROID) 175 MCG TABLET    TAKE 1 TABLET BY MOUTH  DAILY BEFORE BREAKFAST   LISINOPRIL (PRINIVIL,ZESTRIL) 10 MG TABLET    TAKE 1 TABLET BY MOUTH ONCE DAILY FOR BLOOD PRESSURE   LORAZEPAM (ATIVAN) 0.5 MG TABLET    TAKE 1 TABLET BY MOUTH 3  TIMES A DAY AS NEEDED FOR  ANXIETY OR NERVOUSNESS   LUTEIN PO    Take by mouth daily.   METOPROLOL TARTRATE (LOPRESSOR) 25 MG TABLET    TAKE 1 TAB PO IN THE MORNING AND 1/2 TABLET IN THE EVENING for blood pressure   MULTIPLE VITAMINS-MINERALS (HEALTHY EYES/LUTEIN PO)    Take 1 tablet by mouth daily.   OMEPRAZOLE (PRILOSEC) 20 MG CAPSULE    Take 1 capsule (20 mg  total) by mouth daily.   ONETOUCH DELICA LANCETS 38H MISC    USE TWO TIMES DAILY   ONETOUCH VERIO TEST STRIP    CHECK BLOOD SUGAR TWO TIMES DAILY AND AS DIRECTED   TIOTROPIUM (SPIRIVA HANDIHALER) 18 MCG INHALATION CAPSULE    PLACE 1 CAPSULE INTO  HANDIHALER AND INHALE THE  CONTENTS OF 1 CAPSULE VIA  HANDIHALER DAILY  Modified Medications   No medications on  file  Discontinued Medications   BIOTIN PO    Take 500 mg by mouth daily.   GLUCOSAMINE-CHONDROITIN-MSM PO    Take 3 tablets by mouth daily.   VITAMIN B-12 (CYANOCOBALAMIN) 1000 MCG TABLET    Take 1,000 mcg by mouth daily.     Physical Exam:  Vitals:   03/04/18 1413  BP: 122/74  Pulse: 72  Temp: 97.9 F (36.6 C)  TempSrc: Oral  SpO2: 95%  Weight: 190 lb (86.2 kg)  Height: 5\' 3"  (1.6 m)   Body mass index is 33.66 kg/m.  Physical Exam  Constitutional: She is oriented to person, place, and time. She appears well-developed and well-nourished.  Cardiovascular: Normal rate, regular rhythm and normal heart sounds.  Pulmonary/Chest: Effort normal. She has decreased breath sounds.  Neurological: She is alert and oriented to person, place, and time.  Skin: Skin is warm and dry.  Psychiatric: She has a normal mood and affect.    Labs reviewed: Basic Metabolic Panel: Recent Labs    04/28/17 1156 06/16/17 1057 07/28/17 1112 10/30/17 1241  NA 143  --  141 140  K 4.2  --  4.2 4.5  CL 105  --  106 106  CO2 32  --  29 26  GLUCOSE 90  --  109 61*  BUN 13  --  12 16  CREATININE 0.84  --  0.81 0.85  CALCIUM 9.7  --  9.3 9.3  TSH 13.53* 1.58 0.33* 0.48   Liver Function Tests: Recent Labs    04/28/17 1156 07/28/17 1112 10/30/17 1241  AST 16  --  19  ALT 14 13 15   BILITOT 0.2  --  0.3  PROT 7.0  --  6.9   No results for input(s): LIPASE, AMYLASE in the last 8760 hours. No results for input(s): AMMONIA in the last 8760 hours. CBC: Recent Labs    04/28/17 1156  WBC 7.8  NEUTROABS 5,273  HGB 12.5  HCT 39.0  MCV  84.4  PLT 264   Lipid Panel: Recent Labs    04/28/17 1156 07/28/17 1112 10/30/17 1241  CHOL 139 111 122  HDL 48* 45* 48*  LDLCALC 72 48 56  TRIG 109 95 98  CHOLHDL 2.9 2.5 2.5   TSH: Recent Labs    06/16/17 1057 07/28/17 1112 10/30/17 1241  TSH 1.58 0.33* 0.48   A1C: Lab Results  Component Value Date   HGBA1C 6.1 (H) 10/30/2017     Assessment/Plan 1. Financial difficulties She is already being followed by SW through [facility]  2. COPD with asthma ([place]) -encouraged pulmonary evaluation for proper treatment of COPD and updated PFT, by the end of the visit she was agreeable.  - Tiotropium Bromide Monohydrate (SPIRIVA RESPIMAT) 2.5 MCG/ACT AERS; Inhale 2 puffs into the lungs daily.  Dispense: 4 g; Refill: 4 - Ambulatory referral to Pulmonology  Next appt: 04/21/2018 Janett Billow K. [place], [facility] [phone]

## 2018-03-05 ENCOUNTER — Ambulatory Visit: Payer: Self-pay

## 2018-03-22 ENCOUNTER — Other Ambulatory Visit: Payer: Self-pay

## 2018-03-22 NOTE — Patient Outreach (Signed)
[facility]) Care Management  03/22/2018  Grace [name] 1955-05-19 [id number]  BSW has yet to receive a return call from [employer] regarding patient referral submitted on 10/24 via [place]. BSW sent a secure e-mail to [facility], family service coordinator with [employer] requesting an update on patients referral status.  [name], BSW, CDP [facility] [phone]

## 2018-03-24 ENCOUNTER — Other Ambulatory Visit: Payer: Self-pay

## 2018-03-24 NOTE — Patient Outreach (Signed)
[facility]) Care Management  03/24/2018  Grace [name] 02-02-1956 [id number]  Successful outreach to the patient on today's date, HIPAA identifiers confirmed. BSW discussed difficulty in contacting [facility] with community housing solutions [facility]) regarding patient referral for home modifications placed on 10/24. The patient denies recent contact by [employer] and states she understands it may take some time prior to receiving assistance.  The patient does recall being contacted by "someone" late October who discussed patient housing needs. The patient reports she does not remember who she spoke with or how to get back in contact with them. The patient states she discussed home modification needs as well as concern over paying yearly property taxes. The patient states the person she spoke with discussed placing a referral to legal aide for assistance with switching the home to her name. The patient also reported this person planned to assist in obtaining a property tax reduction due to the patient being on disability.   The patient inquired if BSW would know who may have contacted her. BSW apologized to the patient for not knowing who outreached her. BSW did encourage the patient to contact [employer] [facility]) as this BSW had also sent her referral to [facility] on 10/24. The patients referral had been rejected due to [facility] not providing home modifications. BSW explained that perhaps they outreached the patient as they do have programs to assist with foreclosure prevention. BSW provided the patient with the contact number to [facility] for follow up.  Plan: BSW to outreach the patient within the next month. BSW to continue attempting to contact [facility] to follow up on the patients referral.  [name], Arita Miss, [facility] Management Social Worker [phone]

## 2018-03-25 ENCOUNTER — Ambulatory Visit (INDEPENDENT_AMBULATORY_CARE_PROVIDER_SITE_OTHER): Payer: Medicare Other | Admitting: Internal Medicine

## 2018-03-25 ENCOUNTER — Ambulatory Visit: Payer: Self-pay

## 2018-03-25 ENCOUNTER — Encounter: Payer: Self-pay | Admitting: Internal Medicine

## 2018-03-25 VITALS — BP 136/68 | HR 101 | Ht 62.75 in | Wt 190.0 lb

## 2018-03-25 DIAGNOSIS — J449 Chronic obstructive pulmonary disease, unspecified: Secondary | ICD-10-CM

## 2018-03-25 DIAGNOSIS — I10 Essential (primary) hypertension: Secondary | ICD-10-CM | POA: Diagnosis not present

## 2018-03-25 MED ORDER — LOSARTAN POTASSIUM 50 MG PO TABS: 50.0000 mg | ORAL_TABLET | Freq: Every day | ORAL | 11 refills | Status: DC

## 2018-03-25 MED ORDER — GLYCOPYRROLATE-FORMOTEROL 9-4.8 MCG/ACT IN AERO: 2.0000 | INHALATION_SPRAY | Freq: Two times a day (BID) | RESPIRATORY_TRACT | 0 refills | Status: AC

## 2018-03-25 MED ORDER — GLYCOPYRROLATE-FORMOTEROL 9-4.8 MCG/ACT IN AERO: 2.0000 | INHALATION_SPRAY | Freq: Two times a day (BID) | RESPIRATORY_TRACT | 0 refills | Status: DC

## 2018-03-25 NOTE — Patient Instructions (Addendum)
Handicap sticker today - consider pulmonary rehab next visit   Stop lisinopril and spiriva   Start losartan 50 mg daily   Plan A = Automatic = Bevespi Take 2 puffs first thing in am and then another 2 puffs about 12 hours later.   Work on inhaler technique:  relax and gently blow all the way out then take a nice smooth deep breath back in, triggering the inhaler at same time you start breathing in.  Hold for up to 5 seconds if you can. Blow out thru nose. Rinse and gargle with water when done       Plan B = Backup Only use your albuterol inhaler as a rescue medication to be used if you can't catch your breath by resting or doing a relaxed purse lip breathing pattern.  - The less you use it, the better it will work when you need it. - Ok to use the inhaler up to 2 puffs  every 4 hours if you must but call for appointment if use goes up over your usual need - Don't leave home without it !!  (think of it like the spare tire for your car)   Plan C = Crisis - only use your albuterol nebulizer if you first try Plan B and it fails to help > ok to use the nebulizer up to every 4 hours but if start needing it regularly call for immediate appointment   Please schedule a follow up office visit in 4 weeks, sooner if needed with pfts and cxr and bring all active medications with you  - needs alpha one screen on return

## 2018-03-25 NOTE — Progress Notes (Addendum)
Grace [name], female    DOB: 08-30-1955,     MRN: [id number]    Brief patient profile:  72 yowf quit smoking 2017 with resp failure following emergency appy with dx of copd but no pfts in epic referred to pulmonary clinic 03/25/2018 by [name] for copd with GOLD III criteria on initial eval.    History of Present Illness  03/25/2018  Pulmonary/ 1st office eval/[name]  Chief Complaint  Patient presents with  . Pulmonary Consult    Referred by Dr. Sherrie Mustache. Pt c/o SOB for the past 6 months. She states she is SOB "all the time". She states she wakes up occ in the night gasping for air. She gets SOB walking from one end of her house to the other. She uses her proair inhaler on average 3  per day.   Dyspnea:  Indolent onset min progessive x ? 5 years now to [place] where slow walk  Push buggy at [facility]  = [facility] = can't walk 100 yards even at a slow pace at a flat grade s stopping due to sob   Cough: in am min mucoid worse in cold weather Sleep: on side / 2 pillows - wakes up maybe once a week gasping for air SABA use: first thing in am then spiriva after that  Plus sev more saba during the day if "overdoes it"    No obvious day to day or daytime variability or assoc excess/ purulent sputum or mucus plugs or hemoptysis or cp or chest tightness, subjective wheeze or overt sinus or hb symptoms.       Also denies any obvious fluctuation of symptoms with weather or environmental changes or other aggravating or alleviating factors except as outlined above   No unusual exposure hx or h/o childhood pna/ asthma or knowledge of premature birth.  Current Allergies, Complete Past Medical History, Past Surgical History, Family History, and Social History were reviewed in [employer] record.  ROS  The following are not active complaints unless bolded Hoarseness, sore throat, dysphagia, dental problems, itching, sneezing,  nasal congestion or discharge of excess mucus or  purulent secretions, ear ache,   fever, chills, sweats, unintended wt loss or wt gain, classically pleuritic or exertional cp,  orthopnea pnd or arm/hand swelling  or leg swelling, presyncope, palpitations, abdominal pain, anorexia, nausea, vomiting, diarrhea  or change in bowel habits or change in bladder habits, change in stools or change in urine, dysuria, hematuria,  rash, arthralgias, visual complaints, headache, numbness, weakness or ataxia or problems with walking or coordination,  change in mood or  memory.           Past Medical History:  Diagnosis Date  . Abnormal vaginal bleeding   . Asthma   . Barrett's esophagus    with high grade dysplasia per endoscopy 05/2005 // followed by Dr. [name] ([facility])  . Chronic back pain   . COPD (chronic obstructive pulmonary disease) ([place])   . Diabetes mellitus without complication ([place])   . Duodenal ulcer disease    thought to be contributed at least partly by overuse of NSAIDs and salicylates (goody powdr)  . Endometrial polyp 03/2003   s/p resection in 03/2003. Path showing submucosal leiomyoma and benign  proliferative type endometrium  . Fibroid uterus    s/p myomectomy x 2  . GERD (gastroesophageal reflux disease)   . H/O failed moderate sedation   . Hiatal hernia   . History of CVA (  cerebrovascular accident) 10/2000  . 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 Hyperlipidemia   . Hypertension   . Hypothyroidism    with hx of multinodular goiter (noted on neck US in 11/2002 - showing diffuse nodularity and inhomogenous texture diffusely BL)  . Internal hemorrhoids    grade 2 per colonoscopy in 09/2006 - repeat colonoscopy rec in 5-10 years.  . Migraine   . Nevus   . Personal history of failed conscious sedation 08/11/2011  . Tachycardia   . Thyroid disease   . Tobacco abuse Jan 2014   quit    Outpatient Medications Prior to Visit    Medication Sig Dispense Refill  . albuterol (PROAIR HFA) 108 (90 Base) MCG/ACT inhaler INHALE 2 PUFFS EVERY 6  HOURS AS NEEDED FOR  SHORTNESS OF BREATH OR  WHEEZING 34 g 3  . alum & mag hydroxide-simeth (MAALOX/MYLANTA) 200-200-20 MG/5ML suspension Take by mouth every 6 (six) hours as needed for indigestion or heartburn.    . Aspirin-Acetaminophen-Caffeine 782-423-53 MG PACK Take 1 Package by mouth as needed.     Marland Kitchen atorvastatin (LIPITOR) 80 MG tablet TAKE ONE TABLET BY MOUTH  ONCE DAILY AT BEDTIME FOR  CHOLESTEROL 90 tablet 3  . glipiZIDE (GLUCOTROL XL) 5 MG 24 hr tablet TAKE 1 TABLET BY MOUTH  DAILY WITH BREAKFAST 90 tablet 1  . ibuprofen (ADVIL,MOTRIN) 200 MG tablet Take 600 mg by mouth as needed for moderate pain.     Marland Kitchen ipratropium-albuterol (DUONEB) 0.5-2.5 (3) MG/3ML SOLN Take 3 mLs by nebulization every 6 (six) hours as needed. For shortness of breath or wheezing    . levothyroxine (SYNTHROID, LEVOTHROID) 175 MCG tablet TAKE 1 TABLET BY MOUTH  DAILY BEFORE BREAKFAST 90 tablet 1  . lisinopril (PRINIVIL,ZESTRIL) 10 MG tablet TAKE 1 TABLET BY MOUTH ONCE DAILY FOR BLOOD PRESSURE 90 tablet 3  . LORazepam (ATIVAN) 0.5 MG tablet TAKE 1 TABLET BY MOUTH 3  TIMES A DAY AS NEEDED FOR  ANXIETY OR NERVOUSNESS 270 tablet 0  . metoprolol tartrate (LOPRESSOR) 25 MG tablet TAKE 1 TAB PO IN THE MORNING AND 1/2 TABLET IN THE EVENING for blood pressure 135 tablet 1  . omeprazole (PRILOSEC) 20 MG capsule Take 1 capsule (20 mg total) by mouth daily. 90 capsule 3  . ONETOUCH DELICA LANCETS 61W MISC USE TWO TIMES DAILY 200 each 11  . ONETOUCH VERIO test strip CHECK BLOOD SUGAR TWO TIMES DAILY AND AS DIRECTED 100 each 6  . Tiotropium Bromide Monohydrate (SPIRIVA RESPIMAT) 2.5 MCG/ACT AERS Inhale 2 puffs into the lungs daily. 4 g 4  . traZODone (DESYREL) 150 MG tablet Take 150 mg by mouth at bedtime.    . Biotin 5000 MCG CAPS Take by mouth daily.    Marland Kitchen Ketotifen Fumarate (REFRESH EYE ITCH RELIEF OP) Place 1 drop into  both eyes daily as needed. For itchy eyes    . LUTEIN PO Take by mouth daily.    . Multiple Vitamins-Minerals (HEALTHY EYES/LUTEIN PO) Take 1 tablet by mouth daily.     No facility-administered medications prior to visit.      Objective:     BP 136/68 (BP Location: Left Arm, Cuff Size: Normal)   Pulse (!) 101   Ht 5' 2.75" (1.594 m)   Wt 190 lb (86.2 kg)   LMP 02/27/2014   SpO2 94%   BMI 33.93 kg/m  SpO2: 94 %  RA   HEENT: nl dentition / oropharynx. Nl external ear canals without cough reflex -  Mild bilateral non-specific turbinate edema     NECK :  without JVD/Nodes/TM/ nl carotid upstrokes bilaterally   LUNGS: no acc muscle use,  Mod barrel  contour chest wall with bilateral  Distant bs s audible wheeze and  without cough on insp or exp maneuver and mod  Hyperresonant  to  percussion bilaterally     CV:  RRR  no s3 or murmur or increase in P2, and no edema   ABD:  soft and nontender with pos mid  insp Hoover's  in the supine position. No bruits or organomegaly appreciated, bowel sounds nl  MS:   Nl gait/  ext warm without deformities, calf tenderness, cyanosis or clubbing No obvious joint restrictions   SKIN: warm and dry without lesions    NEURO:  alert, approp, nl sensorium with  no motor or cerebellar deficits apparent.      No cxr's on file in Epic      Assessment   COPD GOLD III Stopped smoking 2017 - Spirometry 03/25/2018  FEV1 0.7 (31%)  Ratio 47 p am spiriva/ typical curvature - 03/25/2018   Walked RA  2 laps @ 279ft each @ slt faster than avg pace  stopped due to end of study / sob but not desat  - 03/25/2018  After extensive coaching inhaler device,  effectiveness =    75% with hfa > try Bevespi      Hx and exam typical of E-copd with GOLD Group B  symptom/risk and laba/lama therefore appropriate rx at this point if can tolerate and afford thru her plan and if not try stiolto or go back to spiriva until returns   Discussed: Formulary  restrictions will be an ongoing challenge for the forseable future and I would be happy to pick an alternative if the pt will first  provide me a list of them but pt  will need to return here for training for any new device that is required eg dpi vs hfa vs respimat.    In meantime we can always provide samples so the patient never runs out of any needed respiratory medications.   Discussed: I spent extra time with pt today reviewing appropriate use of albuterol for prn use on exertion with the following points: 1) saba is for relief of sob that does not improve by walking a slower pace or resting but rather if the pt does not improve after trying this first. 2) If the pt is convinced, as many are, that saba helps recover from activity faster then it's easy to tell if this is the case by re-challenging : ie stop, take the inhaler, then p 5 minutes try the exact same activity (intensity of workload) that just caused the symptoms and see if they are substantially diminished or not after saba 3) if there is an activity that reproducibly causes the symptoms, try the saba 15 min before the activity on alternate days   If in fact the saba really does help, then fine to continue to use it prn but advised we  may then at next ov need to look closer at the maintenance regimen being used to achieve better control of airways disease with exertion.       Essential hypertension Try off acei 03/25/2018 due to "noct smothering"  In the best review of chronic cough to date ( NEJM 2016 375 [id number]) ,  ACEi are now felt to cause cough in up to  20% of pts which is a 4 fold increase from previous reports and does not include the variety of non-specific complaints we see in pulmonary clinic in pts on ACEi but previously attributed to another dx like  Copd/asthma and  include PNDS, throat and chest congestion, "bronchitis", unexplained dyspnea and noct "strangling" sensations, and hoarseness, but also  atypical  /refractory GERD symptoms like dysphagia and "bad heartburn"   The only way I know  to prove this is not an "ACEi Case" is a trial off ACEi x a minimum of 6 weeks then regroup.    rec try losartan 50 mg daily        Total time devoted to counseling  > 50 % of initial 60 min office visit:  review case with pt/ directly observe 02 sat study/  device teaching which extended face to face time for this visit discussion of options/alternatives/ personally creating written customized instructions  in presence of pt  then going over those specific  Instructions directly with the pt including how to use all of the meds but in particular covering each new medication in detail and the difference between the maintenance= "automatic" meds and the prns using an action plan format for the latter (If this problem/symptom => do that organization reading Left to right).  Please see AVS from this visit for a full list of these instructions which I personally wrote for this pt and  are unique to this visit.      Christinia Gully, MD 03/25/2018

## 2018-03-26 ENCOUNTER — Encounter: Payer: Self-pay | Admitting: Internal Medicine

## 2018-03-26 NOTE — Assessment & Plan Note (Signed)
Try off acei 03/25/2018 due to "noct smothering"  In the best review of chronic cough to date ( NEJM 2016 375 [id number]) ,  ACEi are now felt to cause cough in up to  20% of pts which is a 4 fold increase from previous reports and does not include the variety of non-specific complaints we see in pulmonary clinic in pts on ACEi but previously attributed to another dx like  Copd/asthma and  include PNDS, throat and chest congestion, "bronchitis", unexplained dyspnea and noct "strangling" sensations, and hoarseness, but also  atypical /refractory GERD symptoms like dysphagia and "bad heartburn"   The only way I know  to prove this is not an "ACEi Case" is a trial off ACEi x a minimum of 6 weeks then regroup.    rec try losartan 50 mg daily     .

## 2018-03-26 NOTE — Assessment & Plan Note (Signed)
Stopped smoking 2017 - Spirometry 03/25/2018  FEV1 0.7 (31%)  Ratio 47 p am spiriva/ typical curvature - 03/25/2018   Walked RA  2 laps @ 269ft each @ slt faster than avg pace  stopped due to end of study / sob but not desat  - 03/25/2018  After extensive coaching inhaler device,  effectiveness =    75% with hfa > try Bevespi    Hx and exam typical of E-copd with GOLD Group B  symptom/risk and laba/lama therefore appropriate rx at this point if can tolerate and afford thru her plan and if not try stiolto or go back to spiriva until returns   Discussed: Formulary restrictions will be an ongoing challenge for the forseable future and I would be happy to pick an alternative if the pt will first  provide me a list of them but pt  will need to return here for training for any new device that is required eg dpi vs hfa vs respimat.    In meantime we can always provide samples so the patient never runs out of any needed respiratory medications.   Discussed: I spent extra time with pt today reviewing appropriate use of albuterol for prn use on exertion with the following points: 1) saba is for relief of sob that does not improve by walking a slower pace or resting but rather if the pt does not improve after trying this first. 2) If the pt is convinced, as many are, that saba helps recover from activity faster then it's easy to tell if this is the case by re-challenging : ie stop, take the inhaler, then p 5 minutes try the exact same activity (intensity of workload) that just caused the symptoms and see if they are substantially diminished or not after saba 3) if there is an activity that reproducibly causes the symptoms, try the saba 15 min before the activity on alternate days   If in fact the saba really does help, then fine to continue to use it prn but advised we  may then at next ov need to look closer at the maintenance regimen being used to achieve better control of airways disease with exertion.       Total time devoted to counseling  > 50 % of initial 60 min office visit:  review case with pt/ directly observe 02 sat study/  device teaching which extended face to face time for this visit discussion of options/alternatives/ personally creating written customized instructions  in presence of pt  then going over those specific  Instructions directly with the pt including how to use all of the meds but in particular covering each new medication in detail and the difference between the maintenance= "automatic" meds and the prns using an action plan format for the latter (If this problem/symptom => do that organization reading Left to right).  Please see AVS from this visit for a full list of these instructions which I personally wrote for this pt and  are unique to this visit.

## 2018-04-01 ENCOUNTER — Telehealth: Payer: Self-pay | Admitting: Internal Medicine

## 2018-04-01 NOTE — Telephone Encounter (Signed)
  Per Dr. Melvyn Novas 03/25/18 OV- Stop lisinopril and spiriva    Plan A = Automatic = Bevespi Take 2 puffs first thing in am and then another 2 puffs about 12 hours later.  Clarified with [facility] that Patient is to stop Spiriva and take Bevespi. Pharmacy made change.  Nothing further at this time.

## 2018-04-05 ENCOUNTER — Encounter: Payer: Self-pay | Admitting: Nurse Practitioner

## 2018-04-06 NOTE — Telephone Encounter (Signed)
I copied and pasted ManXie response to patient

## 2018-04-09 ENCOUNTER — Telehealth: Payer: Self-pay | Admitting: Nurse Practitioner

## 2018-04-09 ENCOUNTER — Other Ambulatory Visit: Payer: Self-pay

## 2018-04-09 NOTE — Telephone Encounter (Signed)
I spoke with the patient about referral for depression counseling and financial assistance.  Upon review, I noticed that [facility] BSW has been assisting her also.  She wasn't sure if Medicaid application has been processed, so I provided her with information for [facility] of the [place] [phone].  I explained that they offer counseling for depression/anxiety, and services are offered on a sliding fee scale with costs as low as $0 depending on income level. Medicaid is accepted. I explained that they also offer financial counseling. VDM (DD)

## 2018-04-09 NOTE — Patient Outreach (Signed)
[place]) Care Management  04/09/2018  ALYNNA HARGROVE [birth year] [id number]  Unsuccessful call to the patient to follow up on resource needs. BSW left a HIPAA compliant voice message requesting a return call. BSW will outreach the patient within the next four business days.  [name], BSW, CDP [facility] [phone]

## 2018-04-13 ENCOUNTER — Other Ambulatory Visit: Payer: Self-pay

## 2018-04-13 NOTE — Patient Outreach (Signed)
[facility]) Care Management  04/13/2018  Grace [name] 05-05-1955 [id number]  BSW returned call to the patient on today's date, HIPAA identifiers confirmed. The patient states she has been upset the past two weeks after losing her dog. BSW actively listened to the patient and provided support. The patient states she has not been in contact with community housing solutions and has yet to receive a phone call from Mrs. Hopkins. BSW provided the patient with Mrs. Hopkins direct line and requested the patient try contacting her. The patient stated understanding.  The patient denies receiving approval or denial letter regarding Medicaid application. The patient applied for this benefit via an online portal several weeks/months ago. BSW encouraged the patient to log in to her portal to confirm no messages were sent. BSW also encouraged the patient to contact a DSS caseworker to follow up on application status. BSW educated the patient that each Medicaid application is to be processed within 45 business days. The patient feels it has been over 45 days and understands the importance of contacting DSS regarding application status and if she needs to complete another application. BSW also reminded the patient to make copies of all outstanding medical bills as Medicaid may assist with payment if approved. The patient stated understanding.  The patient reports she has not heard back about property tax exclusion based on disability status. The patient continues to be unsure of who she initially spoke with. BSW agreed to investigate exclusion program and whether patient may be eligible based on the fact her home is currently in her deceased mothers name. The patient reports the home was willed to her but she has yet to complete necessary documents to place the home in her name.  Plan: BSW to outreach the patient in the next two weeks. The patient has agreed to investigate Medicaid application  status as well as contact [employer] regarding home modification referral.  [name], BSW, CDP [facility] [phone]

## 2018-04-15 ENCOUNTER — Ambulatory Visit: Payer: Self-pay

## 2018-04-16 ENCOUNTER — Other Ambulatory Visit: Payer: Self-pay

## 2018-04-16 DIAGNOSIS — E034 Atrophy of thyroid (acquired): Secondary | ICD-10-CM

## 2018-04-16 DIAGNOSIS — E1159 Type 2 diabetes mellitus with other circulatory complications: Secondary | ICD-10-CM

## 2018-04-16 DIAGNOSIS — E1140 Type 2 diabetes mellitus with diabetic neuropathy, unspecified: Secondary | ICD-10-CM

## 2018-04-16 DIAGNOSIS — E785 Hyperlipidemia, unspecified: Secondary | ICD-10-CM

## 2018-04-16 DIAGNOSIS — I10 Essential (primary) hypertension: Secondary | ICD-10-CM

## 2018-04-20 ENCOUNTER — Other Ambulatory Visit: Payer: Self-pay | Admitting: Internal Medicine

## 2018-04-21 ENCOUNTER — Other Ambulatory Visit: Payer: Medicare Other

## 2018-04-21 ENCOUNTER — Telehealth: Payer: Self-pay

## 2018-04-21 DIAGNOSIS — E1159 Type 2 diabetes mellitus with other circulatory complications: Secondary | ICD-10-CM

## 2018-04-21 DIAGNOSIS — E785 Hyperlipidemia, unspecified: Secondary | ICD-10-CM | POA: Diagnosis not present

## 2018-04-21 DIAGNOSIS — E1140 Type 2 diabetes mellitus with diabetic neuropathy, unspecified: Secondary | ICD-10-CM

## 2018-04-21 DIAGNOSIS — E034 Atrophy of thyroid (acquired): Secondary | ICD-10-CM | POA: Diagnosis not present

## 2018-04-21 DIAGNOSIS — I10 Essential (primary) hypertension: Secondary | ICD-10-CM | POA: Diagnosis not present

## 2018-04-21 NOTE — Telephone Encounter (Signed)
Spoke with patient, patient states she is not that concerned yet with Janett Billow response she is willing to be seen tomorrow. Patient took B/P at home and reading was 147/95, pulse 84. Patient states she is unable to come today because she has to work on her dogs grave (dog expired)  Scheduled appointment tomorrow at 9:00 am with Janett Billow   Will route back to [name], NP as a Juluis Rainier

## 2018-04-21 NOTE — Telephone Encounter (Signed)
As patient was leaving the lab I offered appointment again and patient refused stating " I have some running around to do and I have been dealing with these symptoms this long, I'll just wait till my pending appointment."  Patient confirmed date and time of pending appointment

## 2018-04-21 NOTE — Telephone Encounter (Signed)
[employer] Database verified and compliance confirmed   

## 2018-04-21 NOTE — Telephone Encounter (Signed)
Patient was in office for blood work and stopped by the clinical desk expressing how she needs to get her B/P under control. Patient states her B/P is running 200 and something over 100 and something consistently. Patient has a manual and automated cuff and gets about the same readings with both. Patient also indicated that she is symptomatic with chest pain. I offered patient an appointment today to see [name], NP at 10:45 am, patient refused.   Sending message to [name], NP as a Juluis Rainier

## 2018-04-21 NOTE — Telephone Encounter (Signed)
She definitely needs to be seen for this. She could be having a heart attack due to her blood pressure being so high. She is at risk for cardiac ischemia with other complications and death if blood pressure continues to be high

## 2018-04-21 NOTE — Telephone Encounter (Signed)
Thank you :)

## 2018-04-22 ENCOUNTER — Ambulatory Visit (INDEPENDENT_AMBULATORY_CARE_PROVIDER_SITE_OTHER): Payer: Medicare Other | Admitting: Nurse Practitioner

## 2018-04-22 ENCOUNTER — Encounter: Payer: Self-pay | Admitting: Nurse Practitioner

## 2018-04-22 VITALS — BP 158/90 | HR 68 | Temp 98.0°F | Ht 63.0 in | Wt 195.2 lb

## 2018-04-22 DIAGNOSIS — R079 Chest pain, unspecified: Secondary | ICD-10-CM | POA: Diagnosis not present

## 2018-04-22 DIAGNOSIS — E785 Hyperlipidemia, unspecified: Secondary | ICD-10-CM

## 2018-04-22 DIAGNOSIS — E1140 Type 2 diabetes mellitus with diabetic neuropathy, unspecified: Secondary | ICD-10-CM | POA: Diagnosis not present

## 2018-04-22 DIAGNOSIS — E034 Atrophy of thyroid (acquired): Secondary | ICD-10-CM

## 2018-04-22 DIAGNOSIS — I10 Essential (primary) hypertension: Secondary | ICD-10-CM

## 2018-04-22 LAB — HEMOGLOBIN A1C
Hgb A1c MFr Bld: 6.1 % of total Hgb — ABNORMAL HIGH (ref <5.7)
Mean Plasma Glucose: 128 (calc)
eAG (mmol/L): 7.1 (calc)

## 2018-04-22 LAB — COMPLETE METABOLIC PANEL WITH GFR
AG Ratio: 1.4 (calc) (ref 1.0–2.5)
ALT: 16 U/L (ref 6–29)
AST: 17 U/L (ref 10–35)
Albumin: 3.9 g/dL (ref 3.6–5.1)
Alkaline phosphatase (APISO): 98 U/L (ref 33–130)
BILIRUBIN TOTAL: 0.3 mg/dL (ref 0.2–1.2)
BUN: 14 mg/dL (ref 7–25)
CHLORIDE: 106 mmol/L (ref 98–110)
CO2: 27 mmol/L (ref 20–32)
Calcium: 9 mg/dL (ref 8.6–10.4)
Creat: 0.7 mg/dL (ref 0.50–0.99)
GFR, EST AFRICAN AMERICAN: 108 mL/min/{1.73_m2} (ref >=60)
GFR, EST NON AFRICAN AMERICAN: 93 mL/min/{1.73_m2} (ref >=60)
GLUCOSE: 76 mg/dL (ref 65–99)
Globulin: 2.8 g/dL (calc) (ref 1.9–3.7)
Potassium: 3.8 mmol/L (ref 3.5–5.3)
Sodium: 142 mmol/L (ref 135–146)
Total Protein: 6.7 g/dL (ref 6.1–8.1)

## 2018-04-22 LAB — LIPID PANEL
Cholesterol: 148 mg/dL (ref <200)
HDL: 52 mg/dL (ref >50)
LDL CHOLESTEROL (CALC): 75 mg/dL
NON-HDL CHOLESTEROL (CALC): 96 mg/dL (ref <130)
TRIGLYCERIDES: 124 mg/dL (ref <150)
Total CHOL/HDL Ratio: 2.8 (calc) (ref <5.0)

## 2018-04-22 LAB — TSH: TSH: 0.1 mIU/L — ABNORMAL LOW (ref 0.40–4.50)

## 2018-04-22 MED ORDER — METOPROLOL TARTRATE 25 MG PO TABS
25.0000 mg | ORAL_TABLET | Freq: Two times a day (BID) | ORAL | 0 refills | Status: DC
Start: 2018-04-22 — End: 2018-05-18

## 2018-04-22 MED ORDER — LEVOTHYROXINE SODIUM 150 MCG PO TABS: 150.0000 ug | ORAL_TABLET | Freq: Every day | ORAL | 2 refills | Status: DC

## 2018-04-22 MED ORDER — LOSARTAN POTASSIUM 100 MG PO TABS: 100.0000 mg | ORAL_TABLET | Freq: Every day | ORAL | 1 refills | Status: DC

## 2018-04-22 NOTE — Progress Notes (Signed)
Careteam: Patient Care Team: [name], NP as PCP - General (Geriatric Medicine) [name] as [place] [facility]  Advanced Directive information Does Patient Have a Medical Advance Directive?: No  Allergies  Allergen Reactions  . Anoro Ellipta [Umeclidinium-Vilanterol] Other (See Comments)    Chest pain if used more than 3 consecutive days   . Codeine     REACTION: headaches  . Gabapentin     headache  . Prozac [Fluoxetine Hcl] Other (See Comments)    Suicidal ideations    Chief Complaint  Patient presents with  . Acute Visit    Elevated BP and Chest Pain     HPI: Patient is a 63 y.o. female seen in the office today due to elevated blood pressure and chest pains.  Reports a sharp stabbing pain in the center of her chest that last a "nanosecond" and gets light headed and then it goes away and is fine.  Reports she has had this chest pains for years. However had more intense pain recently and thought she may want to go to the hospital, but then decided not to. Pressure/pain but did not radiate. Not associated with nausea, radiating to shoulder or back. No worsening shortness of breath associated with chest pain however she contributes chest pain to COPD. Her father died on MI at 48, first MI was at 24.  She brings her home blood pressure log and blood pressures in the last 10 days ranging from 167-203/87-108.  Does not eat foods high in sodium, not adding salt.  Taking losartan 50 mg by mouth daily (previously on lisinopril 10)  with metoprolol tartrate 25 mg by mouth twice daily (increased from 1 in am and 1/2 in pm) blood pressure still remaining high.    Saw Dr Melvyn Novas for COPD and loved him, has appreciated his input.  Review of Systems:  Review of Systems  Constitutional: Negative for chills and fever.  HENT:       Reports dental abscess, no fever or pain.  Respiratory: Negative for cough, sputum production and shortness of breath.          With COPD, shortness of breath and cough at baseline.  Cardiovascular: Positive for chest pain. Negative for palpitations, claudication, leg swelling and PND.  Gastrointestinal: Negative for abdominal pain, constipation, diarrhea, heartburn and vomiting.  Musculoskeletal: Negative for back pain, joint pain and myalgias.  Neurological: Negative for dizziness and headaches.    Past Medical History:  Diagnosis Date  . Abnormal vaginal bleeding   . Asthma   . Barrett's esophagus    with high grade dysplasia per endoscopy 05/2005 // followed by Dr. [name] ([facility])  . Chronic back pain   . COPD (chronic obstructive pulmonary disease) ([place])   . Diabetes mellitus without complication ([place])   . Duodenal ulcer disease    thought to be contributed at least partly by overuse of NSAIDs and salicylates (goody powdr)  . Endometrial polyp 03/2003   s/p resection in 03/2003. Path showing submucosal leiomyoma and benign  proliferative type endometrium  . Fibroid uterus    s/p myomectomy x 2  . GERD (gastroesophageal reflux disease)   . H/O failed moderate sedation   . Hiatal hernia   . History of CVA (cerebrovascular accident) 10/2000  . 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 Hyperlipidemia   .  Hypertension   . Hypothyroidism    with hx of multinodular goiter (noted on neck US in 11/2002 - showing diffuse nodularity and inhomogenous texture diffusely BL)  . Internal hemorrhoids    grade 2 per colonoscopy in 09/2006 - repeat colonoscopy rec in 5-10 years.  . Migraine   . Nevus   . Personal history of failed conscious sedation 08/11/2011  . Tachycardia   . Thyroid disease   . Tobacco abuse Jan 2014   quit   Past Surgical History:  Procedure Laterality Date  . CHOLECYSTECTOMY    . COLONOSCOPY  02/07/2010  . HYSTEROSCOPY    . LAPAROSCOPIC APPENDECTOMY N/A 09/23/2015   Procedure: APPENDECTOMY  LAPAROSCOPIC;  Surgeon: Georganna Skeans, MD;  Location: [facility];  Service: General;  Laterality: N/A;  . [place]   x 2 - In 1997, noted to have extensive pelvic adhesions and BL tubal obstruction  . UPPER GASTROINTESTINAL ENDOSCOPY  12/18/2009   Social History:   reports that she quit smoking about 2 years ago. Her smoking use included cigarettes. She has a 42.00 pack-year smoking history. She has never used smokeless tobacco. She reports that she does not drink alcohol or use drugs.  Family History  Problem Relation Age of Onset  . COPD Mother   . Heart disease Father        had 5 heart attacks, first at age 9s.  Marland Kitchen COPD Father     Medications: Patient's Medications  New Prescriptions   No medications on file  Previous Medications   ALBUTEROL (PROAIR HFA) 108 (90 BASE) MCG/ACT INHALER    INHALE 2 PUFFS EVERY 6  HOURS AS NEEDED FOR  SHORTNESS OF BREATH OR  WHEEZING   ALUM & MAG HYDROXIDE-SIMETH (MAALOX/MYLANTA) 200-200-20 MG/5ML SUSPENSION    Take by mouth every 6 (six) hours as needed for indigestion or heartburn.   ASPIRIN-ACETAMINOPHEN-CAFFEINE 169-678-93 MG PACK    Take 1 Package by mouth as needed.    ATORVASTATIN (LIPITOR) 80 MG TABLET    TAKE ONE TABLET BY MOUTH  ONCE DAILY AT BEDTIME FOR  CHOLESTEROL   GLIPIZIDE (GLUCOTROL XL) 5 MG 24 HR TABLET    TAKE 1 TABLET BY MOUTH  DAILY WITH BREAKFAST   GLYCOPYRROLATE-FORMOTEROL (BEVESPI AEROSPHERE) 9-4.8 MCG/ACT AERO    Inhale 2 puffs into the lungs 2 (two) times daily.   IBUPROFEN (ADVIL,MOTRIN) 200 MG TABLET    Take 600 mg by mouth as needed for moderate pain.    IPRATROPIUM-ALBUTEROL (DUONEB) 0.5-2.5 (3) MG/3ML SOLN    Take 3 mLs by nebulization every 6 (six) hours as needed. For shortness of breath or wheezing   LEVOTHYROXINE (SYNTHROID, LEVOTHROID) 175 MCG TABLET    TAKE 1 TABLET BY MOUTH  DAILY BEFORE BREAKFAST   LORAZEPAM (ATIVAN) 0.5 MG TABLET    TAKE 1 TABLET BY MOUTH 3  TIMES A DAY AS NEEDED FOR  ANXIETY OR  NERVOUSNESS   LOSARTAN (COZAAR) 50 MG TABLET    Take 1 tablet (50 mg total) by mouth daily.   METOPROLOL TARTRATE (LOPRESSOR) 25 MG TABLET    TAKE 1 TAB PO IN THE MORNING AND 1/2 TABLET IN THE EVENING for blood pressure   OMEPRAZOLE (PRILOSEC) 20 MG CAPSULE    Take 1 capsule (20 mg total) by mouth daily.   ONETOUCH DELICA LANCETS 81O MISC    USE TWO TIMES DAILY   ONETOUCH VERIO TEST STRIP    CHECK BLOOD SUGAR TWO TIMES DAILY AND AS DIRECTED  TRAZODONE (DESYREL) 150 MG TABLET    Take 150 mg by mouth at bedtime. If taking lorazepam does not take this  Modified Medications   No medications on file  Discontinued Medications   No medications on file     Physical Exam:  Vitals:   04/22/18 0856  BP: (!) 158/90  Pulse: 68  Temp: 98 F (36.7 C)  TempSrc: Oral  SpO2: 97%  Weight: 195 lb 3.2 oz (88.5 kg)  Height: 5\' 3"  (1.6 m)   Body mass index is 34.58 kg/m.  Physical Exam Constitutional:      General: She is not in acute distress.    Appearance: She is well-developed. She is not diaphoretic.  HENT:     Head: Normocephalic and atraumatic.     Mouth/Throat:     Pharynx: No oropharyngeal exudate.  Eyes:     Conjunctiva/sclera: Conjunctivae normal.     Pupils: Pupils are equal, round, and reactive to light.  Neck:     Musculoskeletal: Normal range of motion and neck supple.  Cardiovascular:     Rate and Rhythm: Normal rate and regular rhythm.     Pulses: Normal pulses.     Heart sounds: Normal heart sounds.  Pulmonary:     Effort: Pulmonary effort is normal.     Breath sounds: Normal breath sounds.  Abdominal:     General: Bowel sounds are normal.     Palpations: Abdomen is soft.  Musculoskeletal:        General: No tenderness.  Skin:    General: Skin is warm and dry.  Neurological:     Mental Status: She is alert and oriented to person, place, and time.     Labs reviewed: Basic Metabolic Panel: Recent Labs    07/28/17 1112 10/30/17 1241 04/21/18 0918  NA 141  140 142  K 4.2 4.5 3.8  CL 106 106 106  CO2 29 26 27   GLUCOSE 109 61* 76  BUN 12 16 14   CREATININE 0.81 0.85 0.70  CALCIUM 9.3 9.3 9.0  TSH 0.33* 0.48 0.10*   Liver Function Tests: Recent Labs    04/28/17 1156 07/28/17 1112 10/30/17 1241 04/21/18 0918  AST 16  --  19 17  ALT 14 13 15 16   BILITOT 0.2  --  0.3 0.3  PROT 7.0  --  6.9 6.7   No results for input(s): LIPASE, AMYLASE in the last 8760 hours. No results for input(s): AMMONIA in the last 8760 hours. CBC: Recent Labs    04/28/17 1156  WBC 7.8  NEUTROABS 5,273  HGB 12.5  HCT 39.0  MCV 84.4  PLT 264   Lipid Panel: Recent Labs    07/28/17 1112 10/30/17 1241 04/21/18 0918  CHOL 111 122 148  HDL 45* 48* 52  LDLCALC 48 56 75  TRIG 95 98 124  CHOLHDL 2.5 2.5 2.8   TSH: Recent Labs    07/28/17 1112 10/30/17 1241 04/21/18 0918  TSH 0.33* 0.48 0.10*   A1C: Lab Results  Component Value Date   HGBA1C 6.1 (H) 04/21/2018     Assessment/Plan 1. Essential hypertension - EKG 12-Lead- reviewed with Dr Mariea Clonts SR rate 60 - losartan (COZAAR) 100 MG tablet; Take 1 tablet (100 mg total) by mouth daily.  Dispense: 30 tablet; Refill: 1 - metoprolol tartrate (LOPRESSOR) 25 MG tablet; Take 1 tablet (25 mg total) by mouth 2 (two) times daily.  Dispense: 180 tablet; Refill: 0 -low sodium diet.   2. Hypothyroidism due to acquired  atrophy of thyroid TSH low, will reduce synthroid at this time and follow up TSH in 8 weeks.   - levothyroxine (SYNTHROID, LEVOTHROID) 150 MCG tablet; Take 1 tablet (150 mcg total) by mouth daily before breakfast.  Dispense: 30 tablet; Refill: 2  3. Hyperlipidemia LDL goal <70 LDL 75, continues on lipitor 80 mg daily, encouraged dietary modifications as well.  4. Type 2 diabetes mellitus with diabetic neuropathy, without long-term current use of insulin (HCC) A1c at goal, continues on current regimen for diabetes.  5. Chest pain, unspecified type Ongoing chest pains for years however more  intense at this time. EKG without acute changes but strong family hx of CAD, MI  - Ambulatory referral to Cardiology - CBC with Differential/Platelets - Troponin I -strict precautions to go to the ED discussed and pt reports understanding  Next appt: 2 weeks for blood pressure Grace [name] K. [place], [facility] [phone]

## 2018-04-22 NOTE — Patient Instructions (Addendum)
Increase losartan to 100 mg daily Continue lopressor 25 mg 1 tablet twice daily   We will consult cardiologist.     Angina Angina is a very bad discomfort or pain in the chest, neck, or arm. Angina may cause the following symptoms in your chest:  Crushing or squeezing pain. Pain might last for more than a few minutes at a time. Or, it may stop and come back (recur) over a few minutes.  Tightness.  Fullness.  Pressure.  Heaviness. Some people also have:  Pain in the arms, neck, jaw, or back.  Heartburn or indigestion for no reason.  Shortness of breath.  An upset stomach (nausea).  Sudden cold sweats. Women and people with diabetes may have other less common symptoms such as:  Feeling tired (fatigue).  Feeling nervous or worried for no reason.  Feeling weak for no reason.  Dizziness or fainting. Follow these instructions at home: Medicines  Take over-the-counter and prescription medicines only as told by your doctor.  Do not take these medicines unless your doctor says that you can: ? NSAIDs. These include:  Ibuprofen.  Naproxen.  Celecoxib. ? Vitamin supplements that have vitamin A, vitamin E, or both. ? Hormone therapy that contains estrogen with or without progestin. Eating and drinking   Eat a heart-healthy diet that includes: ? Plenty of fresh fruits and vegetables. ? Whole grains. ? Lowfat (lean) protein. ? Lowfat dairy products.  Work with a diet and nutrition specialist (dietitian) as told by your doctor. This person can help you make healthy food choices.  Follow other instructions from your doctor about eating or drinking restrictions. Activity  Follow an exercise program that your doctor tells you.  Return to your normal activities as told by your doctor. Ask your doctor what activities are safe for you.  When you feel tired, take a break. Plan breaks if you know you are going to feel tired. Lifestyle   Do not use any products that  contain nicotine or tobacco. This includes cigarettes and e-cigarettes. If you need help quitting, ask your doctor.  If your doctor says you can drink alcohol, limit your drinking to: ? 0-1 drink a day for women. ? 0-2 drinks a day for men.  Be aware of how much alcohol is in your drink. In the [place] 1 drink equals to:  12 oz of beer.  5 oz of wine.  1 oz of hard liquor. General instructions  Stay at a healthy weight. If your doctor tells you to do so, work with him or her to lose weight.  Learn to deal with stress. If you need help, ask your doctor.  Take a depression screening test to see if you are at risk for depression. If you feel depressed, talk with your doctor.  Work with your doctor to manage any other health conditions that you have. These may include diabetes or high blood pressure.  Keep your vaccines up to date. Get a flu shot every year.  Keep all follow-up visits as told by your doctor. This is important. Get help right away if:  You have pain in your chest, neck, arm, jaw, stomach, or back, and the pain: ? Lasts more than a few minutes. ? Comes back. ? Is very painful. ? Comes more often. ? Does not get better after you take medicine under your tongue (sublingual nitroglycerin).  You have any of these problems for no reason: ? Heartburn, or indigestion. ? Sweating a lot. ? Shortness of breath. ?  Trouble breathing. ? Feeling sick to your stomach. ? Throwing up. ? Feeling more tired than usual. ? Feeling nervous or worrying more than usual. ? Weakness. ? Watery poop (diarrhea).  You are suddenly dizzy or light-headed.  You pass out (faint). These symptoms may be an emergency. Do not wait to see if the symptoms will go away. Get medical help right away. Call your local emergency services (911 in the [place] Do not drive yourself to the hospital. Summary  Angina is very bad discomfort or pain in the chest, neck, or arm.  Angina may feel like a  crushing or squeezing pain in the chest. It may feel like tightness, pressure, fullness, or heaviness in the chest.  Women or people with diabetes may have different symptoms from men, such as feeling nervous, being worried, being weak for no reason, or feeling tired.  Take medicines only as told by your doctor.  You should eat a heart-healthy diet and follow an exercise program. This information is not intended to replace advice given to you by your health care provider. Make sure you discuss any questions you have with your health care provider. Document Released: 09/17/2007 Document Revised: 05/15/2017 Document Reviewed: 05/15/2017 Elsevier Interactive Patient Education  2019 [employer].

## 2018-04-23 ENCOUNTER — Other Ambulatory Visit: Payer: Self-pay | Admitting: Internal Medicine

## 2018-04-23 DIAGNOSIS — J449 Chronic obstructive pulmonary disease, unspecified: Secondary | ICD-10-CM

## 2018-04-23 LAB — CBC WITH DIFFERENTIAL/PLATELET
Absolute Monocytes: 615 cells/uL (ref 200–950)
BASOS ABS: 38 {cells}/uL (ref 0–200)
Basophils Relative: 0.5 %
EOS PCT: 5.2 %
Eosinophils Absolute: 390 cells/uL (ref 15–500)
HEMATOCRIT: 39.8 % (ref 35.0–45.0)
Hemoglobin: 13.3 g/dL (ref 11.7–15.5)
LYMPHS ABS: 1995 {cells}/uL (ref 850–3900)
MCH: 28.2 pg (ref 27.0–33.0)
MCHC: 33.4 g/dL (ref 32.0–36.0)
MCV: 84.5 fL (ref 80.0–100.0)
MONOS PCT: 8.2 %
MPV: 11.4 fL (ref 7.5–12.5)
NEUTROS PCT: 59.5 %
Neutro Abs: 4463 cells/uL (ref 1500–7800)
Platelets: 280 10*3/uL (ref 140–400)
RBC: 4.71 10*6/uL (ref 3.80–5.10)
RDW: 12.9 % (ref 11.0–15.0)
TOTAL LYMPHOCYTE: 26.6 %
WBC: 7.5 10*3/uL (ref 3.8–10.8)

## 2018-04-23 LAB — TROPONIN I: Troponin I: <0.01 ng/mL (ref <=0.0)

## 2018-04-26 ENCOUNTER — Other Ambulatory Visit: Payer: Self-pay

## 2018-04-26 ENCOUNTER — Ambulatory Visit (INDEPENDENT_AMBULATORY_CARE_PROVIDER_SITE_OTHER): Payer: Medicare Other | Admitting: Internal Medicine

## 2018-04-26 ENCOUNTER — Ambulatory Visit: Payer: Self-pay

## 2018-04-26 ENCOUNTER — Encounter: Payer: Self-pay | Admitting: Internal Medicine

## 2018-04-26 DIAGNOSIS — I10 Essential (primary) hypertension: Secondary | ICD-10-CM

## 2018-04-26 DIAGNOSIS — J449 Chronic obstructive pulmonary disease, unspecified: Secondary | ICD-10-CM

## 2018-04-26 LAB — PULMONARY FUNCTION TEST
DL/VA % pred: 115 %
DL/VA: 5.37 ml/min/mmHg/L
DLCO unc % pred: 73 %
DLCO unc: 16.94 ml/min/mmHg
FEF 25-75 POST: 0.44 L/s
FEF 25-75 Pre: 0.25 L/sec
FEF2575-%Change-Post: 77 %
FEF2575-%Pred-Post: 19 %
FEF2575-%Pred-Pre: 11 %
FEV1-%Change-Post: 23 %
FEV1-%Pred-Post: 35 %
FEV1-%Pred-Pre: 29 %
FEV1-Post: 0.87 L
FEV1-Pre: 0.7 L
FEV1FVC-%CHANGE-POST: 2 %
FEV1FVC-%PRED-PRE: 57 %
FEV6-%Change-Post: 22 %
FEV6-%Pred-Post: 59 %
FEV6-%Pred-Pre: 48 %
FEV6-Post: 1.8 L
FEV6-Pre: 1.47 L
FEV6FVC-%Change-Post: 1 %
FEV6FVC-%Pred-Post: 99 %
FEV6FVC-%Pred-Pre: 97 %
FVC-%Change-Post: 20 %
FVC-%Pred-Post: 60 %
FVC-%Pred-Pre: 49 %
FVC-Post: 1.88 L
FVC-Pre: 1.56 L
POST FEV1/FVC RATIO: 46 %
Post FEV6/FVC ratio: 96 %
Pre FEV1/FVC ratio: 45 %
Pre FEV6/FVC Ratio: 94 %
RV % pred: 185 %
RV: 3.66 L
TLC % pred: 114 %
TLC: 5.62 L

## 2018-04-26 MED ORDER — TIOTROPIUM BROMIDE MONOHYDRATE 2.5 MCG/ACT IN AERS: 2.0000 | INHALATION_SPRAY | Freq: Every day | RESPIRATORY_TRACT | 11 refills | Status: DC

## 2018-04-26 MED ORDER — BUDESONIDE-FORMOTEROL FUMARATE 160-4.5 MCG/ACT IN AERO: 2.0000 | INHALATION_SPRAY | Freq: Two times a day (BID) | RESPIRATORY_TRACT | 0 refills | Status: DC

## 2018-04-26 MED ORDER — TIOTROPIUM BROMIDE MONOHYDRATE 2.5 MCG/ACT IN AERS: 2.0000 | INHALATION_SPRAY | Freq: Every day | RESPIRATORY_TRACT | 0 refills | Status: DC

## 2018-04-26 MED ORDER — BUDESONIDE-FORMOTEROL FUMARATE 160-4.5 MCG/ACT IN AERO: 2.0000 | INHALATION_SPRAY | Freq: Two times a day (BID) | RESPIRATORY_TRACT | 11 refills | Status: DC

## 2018-04-26 NOTE — Progress Notes (Signed)
Patient completed full PFT today. 

## 2018-04-26 NOTE — Patient Outreach (Signed)
[name]) Care Management  04/26/2018  Grace [name] 10/05/1955 [id number]  Return call from the patient, HIPAA identifiers confirmed. The patient reports she has been referred to a cardiologist due to concerns of recent high blood pressure readings. The patient is able to confirm she has been in contact with [employer] regarding home modifications and states "she is supposed to get back in touch with me". The patient states she has spoken with legal aide to discuss estate and tax concerns. The patient reports legal aide is in process of finding someone to assist the patient in settling her mothers estate. Legal aide is also planning to contact the tax department regarding the patient qualifying for the tax deductions program.   Unfortunately, the patient has yet to hear from [facility] and plans to submit another application. The patient is aware her monthly income is over the income limit to qualify for Medicaid but is encouraged to submit all medical bills with her application as she may qualify for a Medicaid deductible. The patient stated understanding.  BSW discussed a discipline closure during today's call. The patient requests this BSW remain active at this time. "I would like to keep you in my corner. I like to know I have someone in case I need them". BSW will remain active with the patient at this time and has scheduled an outreach call in the next month. The patient has this BSW's contact number and is aware she may call if assistance is needed prior to the next scheduled outreach.  [name], BSW, CDP [facility] [phone]

## 2018-04-26 NOTE — Progress Notes (Signed)
Grace [name], female    DOB: [birth year],     MRN: [id number]    Brief patient profile:  49 yowf quit smoking 2017 with resp failure following emergency appy with dx of copd but no pfts in epic referred to pulmonary clinic 03/25/2018 by Grace Mustache NP for copd with GOLD III criteria on initial eval.    History of Present Illness  03/25/2018  Pulmonary/ 1st office eval/Grace [name]  Chief Complaint  Patient presents with  . Pulmonary Consult    Referred by Dr. Sherrie [name]. Pt c/o SOB for the past 6 months. She states she is SOB "all the time". She states she wakes up occ in the night gasping for air. She gets SOB walking from one end of her house to the other. She uses her proair inhaler on average 3  per day.   Dyspnea:  Indolent onset min progessive x ? 5 years now to [place] where slow walk  Push buggy at [facility]  = MMRC3 = can't walk 100 yards even at a slow pace at a flat grade s stopping due to sob   Cough: in am min mucoid worse in cold weather Sleep: on side / 2 pillows - wakes up maybe once a week gasping for air SABA use: first thing in am then spiriva after that  Plus sev more saba during the day if "overdoes it"  rec Handicap sticker today - consider pulmonary rehab next visit  Stop lisinopril and spiriva  Start losartan 50 mg daily  Plan A = Automatic = Bevespi Take 2 puffs first thing in am and then another 2 puffs about 12 hours later.  Work on inhaler technique:  Plan B = Backup Only use your albuterol inhaler as a rescue medication   Plan C = Crisis - only use your albuterol nebulizer if you first try Plan B and it fails to help > ok to use the nebulizer up to every 4 hours but if start needing it regularly call for immediate appointment Please schedule a follow up office visit in 4 weeks, sooner if needed with pfts and cxr and bring all active medications with you  - needs alpha one screen on return      04/26/2018  f/u ov/Grace [name] re:  COPD GOLD III with reversiblity  Chief  Complaint  Patient presents with  . Follow-up    Pt has chest pain and tightness, dry cough, and some SOB with exertion.  Dyspnea: improved but still = MMRC2 = can't walk a nl pace on a flat grade s sob but does fine slow and flat  Cough: dry cough assoc with gen ant cp with coughing /min chest tightness  Sleeping: bed is horizontal/ one pillow  SABA use: much less  02: no    No obvious day to day or daytime variability or assoc excess/ purulent sputum or mucus plugs or hemoptysis   subjective wheeze or overt sinus or hb symptoms.   Sleeping now flat without nocturnal  or early am exacerbation  of respiratory  c/o's or need for noct saba. Also denies any obvious fluctuation of symptoms with weather or environmental changes or other aggravating or alleviating factors except as outlined above   No unusual exposure hx or h/o childhood pna/ asthma or knowledge of premature birth.  Current Allergies, Complete Past Medical History, Past Surgical History, Family History, and Social History were reviewed in [employer] record.  ROS  The following are not active complaints unless  bolded Hoarseness, sore throat, dysphagia, dental problems, itching, sneezing,  nasal congestion or discharge of excess mucus or purulent secretions, ear ache,   fever, chills, sweats, unintended wt loss or wt gain, classically pleuritic or exertional cp,  orthopnea pnd or arm/hand swelling  or leg swelling, presyncope, palpitations, abdominal pain, anorexia, nausea, vomiting, diarrhea  or change in bowel habits or change in bladder habits, change in stools or change in urine, dysuria, hematuria,  rash, arthralgias, visual complaints, headache, numbness, weakness or ataxia or problems with walking or coordination,  change in mood or  memory.        Current Meds  Medication Sig  . albuterol (PROAIR HFA) 108 (90 Base) MCG/ACT inhaler INHALE 2 PUFFS EVERY 6  HOURS AS NEEDED FOR  SHORTNESS OF BREATH OR   WHEEZING  . alum & mag hydroxide-simeth (MAALOX/MYLANTA) 200-200-20 MG/5ML suspension Take by mouth every 6 (six) hours as needed for indigestion or heartburn.  . Aspirin-Acetaminophen-Caffeine 248-250-03 MG PACK Take 1 Package by mouth as needed.   Marland Kitchen atorvastatin (LIPITOR) 80 MG tablet TAKE ONE TABLET BY MOUTH  ONCE DAILY AT BEDTIME FOR  CHOLESTEROL  . glipiZIDE (GLUCOTROL XL) 5 MG 24 hr tablet TAKE 1 TABLET BY MOUTH  DAILY WITH BREAKFAST  . ibuprofen (ADVIL,MOTRIN) 200 MG tablet Take 600 mg by mouth as needed for moderate pain.   Marland Kitchen ipratropium-albuterol (DUONEB) 0.5-2.5 (3) MG/3ML SOLN Take 3 mLs by nebulization every 6 (six) hours as needed. For shortness of breath or wheezing  . levothyroxine (SYNTHROID, LEVOTHROID) 150 MCG tablet Take 1 tablet (150 mcg total) by mouth daily before breakfast.  . LORazepam (ATIVAN) 0.5 MG tablet TAKE 1 TABLET BY MOUTH 3  TIMES A DAY AS NEEDED FOR  ANXIETY OR NERVOUSNESS  . losartan (COZAAR) 100 MG tablet Take 1 tablet (100 mg total) by mouth daily.  . metoprolol tartrate (LOPRESSOR) 25 MG tablet Take 1 tablet (25 mg total) by mouth 2 (two) times daily.  Marland Kitchen omeprazole (PRILOSEC) 20 MG capsule Take 1 capsule (20 mg total) by mouth daily.  Grace [name] DELICA LANCETS 70W MISC USE TWO TIMES DAILY  . ONETOUCH VERIO test strip CHECK BLOOD SUGAR TWO TIMES DAILY AND AS DIRECTED  . traZODone (DESYREL) 150 MG tablet Take 150 mg by mouth at bedtime. If taking lorazepam does not take this              Objective:    amb wf nad   Wt Readings from Last 3 Encounters:  04/26/18 194 lb 9.6 oz (88.3 kg)  04/22/18 195 lb 3.2 oz (88.5 kg)  03/25/18 190 lb (86.2 kg)     Vital signs reviewed - Note on arrival 02 sats  95% on RA and BP 120/82          HEENT: nl dentition / oropharynx. Nl external ear canals without cough reflex -  Mild bilateral non-specific turbinate edema     NECK :  without JVD/Nodes/TM/ nl carotid upstrokes bilaterally   LUNGS: no acc muscle  use,  Mod barrel  contour chest wall with bilateral  Distant bs s audible wheeze and  without cough on insp or exp maneuver and mod  Hyperresonant  to  percussion bilaterally     CV:  RRR  no s3 or murmur or increase in P2, and no edema   ABD:  soft and nontender with pos mid insp Hoover's  in the supine position. No bruits or organomegaly appreciated, bowel sounds nl  MS:  Nl gait/  ext warm without deformities, calf tenderness, cyanosis or clubbing No obvious joint restrictions   SKIN: warm and dry without lesions    NEURO:  alert, approp, nl sensorium with  no motor or cerebellar deficits apparent.               Assessment              Essential hypertension Try off acei 03/25/2018 due to "noct smothering"  In the best review of chronic cough to date ( NEJM 2016 375 1544-1551) ,  ACEi are now felt to cause cough in up to  20% of pts which is a 4 fold increase from previous reports and does not include the variety of non-specific complaints we see in pulmonary clinic in pts on ACEi but previously attributed to another dx like  Copd/asthma and  include PNDS, throat and chest congestion, "bronchitis", unexplained dyspnea and noct "strangling" sensations, and hoarseness, but also  atypical /refractory GERD symptoms like dysphagia and "bad heartburn"   The only way I know  to prove this is not an "ACEi Case" is a trial off ACEi x a minimum of 6 weeks then regroup.    rec try losartan 50 mg daily

## 2018-04-26 NOTE — Patient Outreach (Signed)
[facility]) Care Management  04/26/2018  Grace [name] 1955/05/25 [id number]  Unsuccessful outreach to the patient on today's date in efforts to follow up on the patients outcome of contacting [employer] and [employer]. BSW left a HIPAA compliant voice message requesting a return call. BSW will attempt to outreach the patient within the next four business days pending a return call is not received.  [name], BSW, CDP [facility] [phone]

## 2018-04-26 NOTE — Patient Instructions (Addendum)
Plan A = Automatic = try symbicort 160 x 2 first thing in am and chase with spiriva x 2 pffs then 12 hours later just the symbicort x 2  X 12 hours later  Work on inhaler technique:  relax and gently blow all the way out then take a nice smooth deep breath back in, triggering the inhaler at same time you start breathing in.  Hold for up to 5 seconds if you can. Blow symbicort out thru nose. Rinse and gargle with water when done      Plan B = Backup Only use your albuterol(proair)  inhaler as a rescue medication to be used if you can't catch your breath by resting or doing a relaxed purse lip breathing pattern.  - The less you use it, the better it will work when you need it. - Ok to use the inhaler up to 2 puffs  every 4 hours if you must but call for appointment if use goes up over your usual need - Don't leave home without it !!  (think of it like the spare tire for your car)   Plan C = Crisis - only use your albuterol nebulizer if you first try Plan B and it fails to help > ok to use the nebulizer up to every 4 hours but if start needing it regularly call for immediate appointment    Please remember to go to the lab department   for your tests - we will call you with the results when they are available.       Please schedule a follow up visit in 3 months but call sooner if needed  Add:  Needs cxr on return

## 2018-04-27 ENCOUNTER — Other Ambulatory Visit: Payer: Self-pay | Admitting: *Deleted

## 2018-04-27 ENCOUNTER — Encounter: Payer: Self-pay | Admitting: Internal Medicine

## 2018-04-27 DIAGNOSIS — E1140 Type 2 diabetes mellitus with diabetic neuropathy, unspecified: Secondary | ICD-10-CM

## 2018-04-27 MED ORDER — GLIPIZIDE ER 5 MG PO TB24: 5.0000 mg | ORAL_TABLET | Freq: Every day | ORAL | 1 refills | Status: DC

## 2018-04-27 NOTE — Telephone Encounter (Signed)
[facility] 

## 2018-04-27 NOTE — Assessment & Plan Note (Signed)
Try off acei 03/25/2018 due to "noct smothering"   Although even in retrospect it may not be clear the ACEi contributed to the pt's symptoms,  Pt improved off them and adding them back at this point or in the future would risk confusion in interpretation of non-specific respiratory symptoms to which this patient is prone  ie  Better not to muddy the waters here.   Continue arb instead per PCP

## 2018-04-27 NOTE — Assessment & Plan Note (Addendum)
Stopped smoking 2017 - Spirometry 03/25/2018  FEV1 0.7 (31%)  Ratio 47 p am spiriva/ typical curvature - 03/25/2018   Walked RA  2 laps @ 259ft each @ slt faster than avg pace  stopped due to end of study / sob but not desat  - 03/25/2018   try Bevespi    - PFT's  04/26/2018  FEV1 0.87 (35 % ) ratio 46  p 23 % improvement from saba p bevespi prior to study with DLCO  73 % corrects to 115  % for alv volume   - 04/26/2018  After extensive coaching inhaler device,  effectiveness =    75%  > resume symb 160/spiriva smi - alpha one AT screen sent 04/26/2018    Group D in terms of symptom/risk and laba/lama/ICS  therefore appropriate rx at this point so symb/spiriva best choice in inhaler combo with trelegy an option for future consideration once cough has been totally eliminated as an issue.    >>> f/u in 3 months with cxr on return and check alpha on at status in meantime  I had an extended discussion with the patient reviewing all relevant studies completed to date and  lasting 15 to 20 minutes of a 25 minute visit    See device teaching which extended face to face time for this visit.  Each maintenance medication was reviewed in detail including emphasizing most importantly the difference between maintenance and prns and under what circumstances the prns are to be triggered using an action plan format that is not reflected in the computer generated alphabetically organized AVS which I have not found useful in most complex patients, especially with respiratory illnesses  Please see AVS for specific instructions unique to this visit that I personally wrote and verbalized to the the pt in detail and then reviewed with pt  by my nurse highlighting any  changes in therapy recommended at today's visit to their plan of care.

## 2018-04-28 ENCOUNTER — Ambulatory Visit: Payer: Medicare Other | Admitting: Nurse Practitioner

## 2018-04-29 ENCOUNTER — Ambulatory Visit: Payer: Self-pay

## 2018-05-01 LAB — ALPHA-1 ANTITRYPSIN PHENOTYPE: A-1 Antitrypsin, Ser: 162 mg/dL (ref 83–199)

## 2018-05-03 ENCOUNTER — Telehealth: Payer: Self-pay | Admitting: Internal Medicine

## 2018-05-03 NOTE — Telephone Encounter (Signed)
Notes recorded by Tanda Rockers, MD on 05/02/2018 at 6:58 AM EST Call patient : Study is unremarkable, no change in recs ---------------------------------- Spoke with pt. She is aware of results. Nothing further was needed.

## 2018-05-04 NOTE — Progress Notes (Signed)
Pt given results see phone note dated 05/03/2018

## 2018-05-06 ENCOUNTER — Encounter: Payer: Self-pay | Admitting: Nurse Practitioner

## 2018-05-06 ENCOUNTER — Ambulatory Visit (INDEPENDENT_AMBULATORY_CARE_PROVIDER_SITE_OTHER): Payer: Medicare Other | Admitting: Nurse Practitioner

## 2018-05-06 VITALS — BP 150/88 | HR 56 | Temp 97.7°F | Resp 10 | Ht 63.0 in | Wt 193.0 lb

## 2018-05-06 DIAGNOSIS — I10 Essential (primary) hypertension: Secondary | ICD-10-CM | POA: Diagnosis not present

## 2018-05-06 DIAGNOSIS — E034 Atrophy of thyroid (acquired): Secondary | ICD-10-CM | POA: Diagnosis not present

## 2018-05-06 DIAGNOSIS — R51 Headache: Secondary | ICD-10-CM | POA: Diagnosis not present

## 2018-05-06 DIAGNOSIS — R519 Headache, unspecified: Secondary | ICD-10-CM

## 2018-05-06 MED ORDER — LEVOTHYROXINE SODIUM 150 MCG PO TABS: 150.0000 ug | ORAL_TABLET | Freq: Every day | ORAL | 1 refills | Status: DC

## 2018-05-06 MED ORDER — AMLODIPINE BESYLATE 5 MG PO TABS: 5.0000 mg | ORAL_TABLET | Freq: Every day | ORAL | 0 refills | Status: DC

## 2018-05-06 NOTE — Patient Instructions (Signed)
Continue losartan 100 mg daily Continue metoprolol at 25 mg by mouth twice daily ADD norvasc 5 mg daily   DASH Eating Plan DASH stands for "Dietary Approaches to Stop Hypertension." The DASH eating plan is a healthy eating plan that has been shown to reduce high blood pressure (hypertension). It may also reduce your risk for type 2 diabetes, heart disease, and stroke. The DASH eating plan may also help with weight loss. What are tips for following this plan?  General guidelines  Avoid eating more than 2,300 mg (milligrams) of salt (sodium) a day. If you have hypertension, you may need to reduce your sodium intake to 1,500 mg a day.  Limit alcohol intake to no more than 1 drink a day for nonpregnant women and 2 drinks a day for men. One drink equals 12 oz of beer, 5 oz of wine, or 1 oz of hard liquor.  Work with your health care provider to maintain a healthy body weight or to lose weight. Ask what an ideal weight is for you.  Get at least 30 minutes of exercise that causes your heart to beat faster (aerobic exercise) most days of the week. Activities may include walking, swimming, or biking.  Work with your health care provider or diet and nutrition specialist (dietitian) to adjust your eating plan to your individual calorie needs. Reading food labels   Check food labels for the amount of sodium per serving. Choose foods with less than 5 percent of the Daily Value of sodium. Generally, foods with less than 300 mg of sodium per serving fit into this eating plan.  To find whole grains, look for the word "whole" as the first word in the ingredient list. Shopping  Buy products labeled as "low-sodium" or "no salt added."  Buy fresh foods. Avoid canned foods and premade or frozen meals. Cooking  Avoid adding salt when cooking. Use salt-free seasonings or herbs instead of table salt or sea salt. Check with your health care provider or pharmacist before using salt substitutes.  Do not fry  foods. Cook foods using healthy methods such as baking, boiling, grilling, and broiling instead.  Cook with heart-healthy oils, such as olive, canola, soybean, or sunflower oil. Meal planning  Eat a balanced diet that includes: ? 5 or more servings of fruits and vegetables each day. At each meal, try to fill half of your plate with fruits and vegetables. ? Up to 6-8 servings of whole grains each day. ? Less than 6 oz of lean meat, poultry, or fish each day. A 3-oz serving of meat is about the same size as a deck of cards. One egg equals 1 oz. ? 2 servings of low-fat dairy each day. ? A serving of nuts, seeds, or beans 5 times each week. ? Heart-healthy fats. Healthy fats called Omega-3 fatty acids are found in foods such as flaxseeds and coldwater fish, like sardines, salmon, and mackerel.  Limit how much you eat of the following: ? Canned or prepackaged foods. ? Food that is high in trans fat, such as fried foods. ? Food that is high in saturated fat, such as fatty meat. ? Sweets, desserts, sugary drinks, and other foods with added sugar. ? Full-fat dairy products.  Do not salt foods before eating.  Try to eat at least 2 vegetarian meals each week.  Eat more home-cooked food and less restaurant, buffet, and fast food.  When eating at a restaurant, ask that your food be prepared with less salt or no  salt, if possible. What foods are recommended? The items listed may not be a complete list. Talk with your dietitian about what dietary choices are best for you. Grains Whole-grain or whole-wheat bread. Whole-grain or whole-wheat pasta. Brown rice. [place]row. Bulgur. Whole-grain and low-sodium cereals. Pita bread. Low-fat, low-sodium crackers. Whole-wheat flour tortillas. Vegetables Fresh or frozen vegetables (raw, steamed, roasted, or grilled). Low-sodium or reduced-sodium tomato and vegetable juice. Low-sodium or reduced-sodium tomato sauce and tomato paste. Low-sodium or  reduced-sodium canned vegetables. Fruits All fresh, dried, or frozen fruit. Canned fruit in natural juice (without added sugar). Meat and other protein foods Skinless chicken or [place]. Ground chicken or [place]. Pork with fat trimmed off. Fish and seafood. Egg whites. Dried beans, peas, or lentils. Unsalted nuts, nut butters, and seeds. Unsalted canned beans. Lean cuts of beef with fat trimmed off. Low-sodium, lean deli meat. Dairy Low-fat (1%) or fat-free (skim) milk. Fat-free, low-fat, or reduced-fat cheeses. Nonfat, low-sodium ricotta or cottage cheese. Low-fat or nonfat yogurt. Low-fat, low-sodium cheese. Fats and oils Soft margarine without trans fats. Vegetable oil. Low-fat, reduced-fat, or light mayonnaise and salad dressings (reduced-sodium). Canola, safflower, olive, soybean, and sunflower oils. Avocado. Seasoning and other foods Herbs. Spices. Seasoning mixes without salt. Unsalted popcorn and pretzels. Fat-free sweets. What foods are not recommended? The items listed may not be a complete list. Talk with your dietitian about what dietary choices are best for you. Grains Baked goods made with fat, such as croissants, muffins, or some breads. Dry pasta or rice meal packs. Vegetables Creamed or fried vegetables. Vegetables in a cheese sauce. Regular canned vegetables (not low-sodium or reduced-sodium). Regular canned tomato sauce and paste (not low-sodium or reduced-sodium). Regular tomato and vegetable juice (not low-sodium or reduced-sodium). Angie Fava. Olives. Fruits Canned fruit in a light or heavy syrup. Fried fruit. Fruit in cream or butter sauce. Meat and other protein foods Fatty cuts of meat. Ribs. Fried meat. Berniece Salines. Sausage. Bologna and other processed lunch meats. Salami. Fatback. Hotdogs. Bratwurst. Salted nuts and seeds. Canned beans with added salt. Canned or smoked fish. Whole eggs or egg yolks. Chicken or [place] with skin. Dairy Whole or 2% milk, cream, and half-and-half.  Whole or full-fat cream cheese. Whole-fat or sweetened yogurt. Full-fat cheese. Nondairy creamers. Whipped toppings. Processed cheese and cheese spreads. Fats and oils Butter. Stick margarine. Lard. Shortening. Ghee. Bacon fat. Tropical oils, such as coconut, palm kernel, or palm oil. Seasoning and other foods Salted popcorn and pretzels. Onion salt, garlic salt, seasoned salt, table salt, and sea salt. Worcestershire sauce. Tartar sauce. Barbecue sauce. Teriyaki sauce. Soy sauce, including reduced-sodium. Steak sauce. Canned and packaged gravies. Fish sauce. Oyster sauce. Cocktail sauce. Horseradish that you find on the shelf. Ketchup. Mustard. Meat flavorings and tenderizers. Bouillon cubes. Hot sauce and Tabasco sauce. Premade or packaged marinades. Premade or packaged taco seasonings. Relishes. Regular salad dressings. Where to find more information:  National Heart, Lung, and [place]: https://wilson-eaton.com/  American Heart Association: www.heart.org Summary  The DASH eating plan is a healthy eating plan that has been shown to reduce high blood pressure (hypertension). It may also reduce your risk for type 2 diabetes, heart disease, and stroke.  With the DASH eating plan, you should limit salt (sodium) intake to 2,300 mg a day. If you have hypertension, you may need to reduce your sodium intake to 1,500 mg a day.  When on the DASH eating plan, aim to eat more fresh fruits and vegetables, whole grains, lean proteins, low-fat dairy, and heart-healthy  fats.  Work with your health care provider or diet and nutrition specialist (dietitian) to adjust your eating plan to your individual calorie needs. This information is not intended to replace advice given to you by your health care provider. Make sure you discuss any questions you have with your health care provider. Document Released: 03/20/2011 Document Revised: 03/24/2016 Document Reviewed: 03/24/2016 Elsevier Interactive Patient Education   2019 [employer].

## 2018-05-06 NOTE — Progress Notes (Signed)
Careteam: Patient Care Team: [name], NP as PCP - General (Geriatric Medicine) [name] as [place] Management  Advanced Directive information    Allergies  Allergen Reactions  . Anoro Ellipta [Umeclidinium-Vilanterol] Other (See Comments)    Chest pain if used more than 3 consecutive days   . Codeine     REACTION: headaches  . Gabapentin     headache  . Prozac [Fluoxetine Hcl] Other (See Comments)    Suicidal ideations    Chief Complaint  Patient presents with  . Follow-up    2 week blood pressure follow-up. Patient c/o head pounding off/on (more on than off)    . Medication Management    RX for Levothyroxine never received from [facility]. Looks like RX was sent to [facility] in error. RX now submitted to [facility]      HPI: Patient is a 63 y.o. female seen in the office today for follow up due to high blood pressure.  Taking losartan 100 mg and metoprolol 25 mg in the morning.  Added additional losartan and metoprolol 12.5 mg daily and had a better blood pressure reading.  Having headaches when her blood pressure is elevated. Hx of migraines.  Ranging at home from 126-170 (205)/73-110 HR- 64-90 Losartan was increased from 50- 100 mg daily at last OV and metoprolol was increased from 25 mg in the am and 12.5 mg in the pm to 25 mg BID.   Review of Systems:  Review of Systems  Constitutional: Negative for chills and fever.  HENT:       Reports dental abscess, no fever or pain.  Respiratory: Negative for cough, sputum production and shortness of breath.        With COPD, shortness of breath and cough at baseline.  Cardiovascular: Negative for chest pain, palpitations, claudication, leg swelling and PND.  Gastrointestinal: Negative for abdominal pain, constipation, diarrhea, heartburn and vomiting.  Musculoskeletal: Negative for back pain, joint pain and myalgias.  Neurological: Positive for headaches. Negative for dizziness.    Past  Medical History:  Diagnosis Date  . Abnormal vaginal bleeding   . Asthma   . Barrett's esophagus    with high grade dysplasia per endoscopy 05/2005 // followed by Dr. [name] ([facility])  . Chronic back pain   . COPD (chronic obstructive pulmonary disease) ([place])   . Diabetes mellitus without complication ([place])   . Duodenal ulcer disease    thought to be contributed at least partly by overuse of NSAIDs and salicylates (goody powdr)  . Endometrial polyp 03/2003   s/p resection in 03/2003. Path showing submucosal leiomyoma and benign  proliferative type endometrium  . Fibroid uterus    s/p myomectomy x 2  . GERD (gastroesophageal reflux disease)   . H/O failed moderate sedation   . Hiatal hernia   . History of CVA (cerebrovascular accident) 10/2000  . 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 Hyperlipidemia   . Hypertension   . Hypothyroidism    with hx of multinodular goiter (noted on neck US in 11/2002 - showing diffuse nodularity and inhomogenous texture diffusely BL)  . Internal hemorrhoids    grade 2 per colonoscopy in 09/2006 - repeat colonoscopy rec in 5-10 years.  . Migraine   . Nevus   . Personal history of failed conscious sedation 08/11/2011  . Tachycardia   .  Thyroid disease   . Tobacco abuse Jan 2014   quit   Past Surgical History:  Procedure Laterality Date  . CHOLECYSTECTOMY    . COLONOSCOPY  02/07/2010  . HYSTEROSCOPY    . LAPAROSCOPIC APPENDECTOMY N/A 09/23/2015   Procedure: APPENDECTOMY LAPAROSCOPIC;  Surgeon: Georganna Skeans, MD;  Location: [facility];  Service: General;  Laterality: N/A;  . [place]   x 2 - In 1997, noted to have extensive pelvic adhesions and BL tubal obstruction  . UPPER GASTROINTESTINAL ENDOSCOPY  12/18/2009   Social History:   reports that she quit smoking about 2 years ago. Her smoking use included cigarettes. She has a 150.00 pack-year  smoking history. She has never used smokeless tobacco. She reports that she does not drink alcohol or use drugs.  Family History  Problem Relation Age of Onset  . COPD Mother   . Heart disease Father        had 5 heart attacks, first at age 71s.  Marland Kitchen COPD Father     Medications: Patient's Medications  New Prescriptions   No medications on file  Previous Medications   ALBUTEROL (PROAIR HFA) 108 (90 BASE) MCG/ACT INHALER    INHALE 2 PUFFS EVERY 6  HOURS AS NEEDED FOR  SHORTNESS OF BREATH OR  WHEEZING   ALUM & MAG HYDROXIDE-SIMETH (MAALOX/MYLANTA) 200-200-20 MG/5ML SUSPENSION    Take by mouth every 6 (six) hours as needed for indigestion or heartburn.   ASPIRIN-ACETAMINOPHEN-CAFFEINE 470-962-83 MG PACK    Take 1 Package by mouth as needed.    ATORVASTATIN (LIPITOR) 80 MG TABLET    TAKE ONE TABLET BY MOUTH  ONCE DAILY AT BEDTIME FOR  CHOLESTEROL   BUDESONIDE-FORMOTEROL (SYMBICORT) 160-4.5 MCG/ACT INHALER    Inhale 2 puffs into the lungs 2 (two) times daily for 1 day.   GLIPIZIDE (GLUCOTROL XL) 5 MG 24 HR TABLET    Take 1 tablet (5 mg total) by mouth daily with breakfast.   IBUPROFEN (ADVIL,MOTRIN) 200 MG TABLET    Take 600 mg by mouth as needed for moderate pain.    IPRATROPIUM-ALBUTEROL (DUONEB) 0.5-2.5 (3) MG/3ML SOLN    Take 3 mLs by nebulization every 6 (six) hours as needed. For shortness of breath or wheezing   LORAZEPAM (ATIVAN) 0.5 MG TABLET    TAKE 1 TABLET BY MOUTH 3  TIMES A DAY AS NEEDED FOR  ANXIETY OR NERVOUSNESS   LOSARTAN (COZAAR) 100 MG TABLET    Take 1 tablet (100 mg total) by mouth daily.   METOPROLOL TARTRATE (LOPRESSOR) 25 MG TABLET    Take 1 tablet (25 mg total) by mouth 2 (two) times daily.   OMEPRAZOLE (PRILOSEC) 20 MG CAPSULE    Take 1 capsule (20 mg total) by mouth daily.   ONETOUCH DELICA LANCETS 66Q MISC    USE TWO TIMES DAILY   ONETOUCH VERIO TEST STRIP    CHECK BLOOD SUGAR TWO TIMES DAILY AND AS DIRECTED   TIOTROPIUM BROMIDE MONOHYDRATE (SPIRIVA RESPIMAT) 2.5  MCG/ACT AERS    Inhale 2 puffs into the lungs daily for 1 day.   TRAZODONE (DESYREL) 150 MG TABLET    Take 150 mg by mouth at bedtime. If taking lorazepam does not take this  Modified Medications   Modified Medication Previous Medication   LEVOTHYROXINE (SYNTHROID, LEVOTHROID) 150 MCG TABLET levothyroxine (SYNTHROID, LEVOTHROID) 150 MCG tablet      Take 1 tablet (150 mcg total) by mouth daily before breakfast.    Take 1 tablet (150  mcg total) by mouth daily before breakfast.  Discontinued Medications   No medications on file     Physical Exam:  Vitals:   05/06/18 1129  BP: (!) 150/88  Pulse: (!) 56  Resp: 10  Temp: 97.7 F (36.5 C)  TempSrc: Oral  SpO2: 95%  Weight: 193 lb (87.5 kg)  Height: 5\' 3"  (1.6 m)   Body mass index is 34.19 kg/m.  Physical Exam Constitutional:      General: She is not in acute distress.    Appearance: She is well-developed. She is not diaphoretic.  HENT:     Head: Normocephalic and atraumatic.     Mouth/Throat:     Pharynx: No oropharyngeal exudate.  Eyes:     Conjunctiva/sclera: Conjunctivae normal.     Pupils: Pupils are equal, round, and reactive to light.  Neck:     Musculoskeletal: Normal range of motion and neck supple.  Cardiovascular:     Rate and Rhythm: Normal rate and regular rhythm.     Pulses: Normal pulses.     Heart sounds: Normal heart sounds.  Pulmonary:     Effort: Pulmonary effort is normal.     Breath sounds: Normal breath sounds.  Abdominal:     General: Bowel sounds are normal.     Palpations: Abdomen is soft.  Musculoskeletal:        General: No tenderness.  Skin:    General: Skin is warm and dry.  Neurological:     Mental Status: She is alert and oriented to person, place, and time.     Labs reviewed: Basic Metabolic Panel: Recent Labs    07/28/17 1112 10/30/17 1241 04/21/18 0918  NA 141 140 142  K 4.2 4.5 3.8  CL 106 106 106  CO2 29 26 27   GLUCOSE 109 61* 76  BUN 12 16 14   CREATININE 0.81 0.85  0.70  CALCIUM 9.3 9.3 9.0  TSH 0.33* 0.48 0.10*   Liver Function Tests: Recent Labs    07/28/17 1112 10/30/17 1241 04/21/18 0918  AST  --  19 17  ALT 13 15 16   BILITOT  --  0.3 0.3  PROT  --  6.9 6.7   No results for input(s): LIPASE, AMYLASE in the last 8760 hours. No results for input(s): AMMONIA in the last 8760 hours. CBC: Recent Labs    04/22/18 0953  WBC 7.5  NEUTROABS 4,463  HGB 13.3  HCT 39.8  MCV 84.5  PLT 280   Lipid Panel: Recent Labs    07/28/17 1112 10/30/17 1241 04/21/18 0918  CHOL 111 122 148  HDL 45* 48* 52  LDLCALC 48 56 75  TRIG 95 98 124  CHOLHDL 2.5 2.5 2.8   TSH: Recent Labs    07/28/17 1112 10/30/17 1241 04/21/18 0918  TSH 0.33* 0.48 0.10*   A1C: Lab Results  Component Value Date   HGBA1C 6.1 (H) 04/21/2018     Assessment/Plan 1. Hypothyroidism due to acquired atrophy of thyroid - levothyroxine (SYNTHROID, LEVOTHROID) 150 MCG tablet; Take 1 tablet (150 mcg total) by mouth daily before breakfast.  Dispense: 90 tablet; Refill: 1  2. Essential hypertension Not at goal. Educated to call office if readings become elevated and not to self adjust medications. To continue losartan 100 mg by mouth daily and metoprolol 25 mg BID. Will add norvasc 5 mg daily - amLODipine (NORVASC) 5 MG tablet; Take 1 tablet (5 mg total) by mouth daily.  Dispense: 30 tablet; Refill: 0  3. Chronic nonintractable  headache, unspecified headache type Hx of migraines, worse with elevated blood pressure. Will occasionally use NSAID with good relief.   Next appt: 1 week Leiland Mihelich K. [place], [facility] [phone]

## 2018-05-13 ENCOUNTER — Ambulatory Visit (INDEPENDENT_AMBULATORY_CARE_PROVIDER_SITE_OTHER): Payer: Medicare Other | Admitting: Nurse Practitioner

## 2018-05-13 ENCOUNTER — Encounter: Payer: Self-pay | Admitting: Nurse Practitioner

## 2018-05-13 VITALS — BP 142/90 | HR 66 | Temp 97.8°F | Resp 10 | Ht 63.0 in | Wt 192.0 lb

## 2018-05-13 DIAGNOSIS — R079 Chest pain, unspecified: Secondary | ICD-10-CM

## 2018-05-13 DIAGNOSIS — I10 Essential (primary) hypertension: Secondary | ICD-10-CM | POA: Diagnosis not present

## 2018-05-13 DIAGNOSIS — E034 Atrophy of thyroid (acquired): Secondary | ICD-10-CM

## 2018-05-13 DIAGNOSIS — J449 Chronic obstructive pulmonary disease, unspecified: Secondary | ICD-10-CM | POA: Diagnosis not present

## 2018-05-13 DIAGNOSIS — G8929 Other chronic pain: Secondary | ICD-10-CM

## 2018-05-13 DIAGNOSIS — R51 Headache: Secondary | ICD-10-CM

## 2018-05-13 NOTE — Progress Notes (Signed)
Careteam: Patient Care Team: [name], NP as PCP - General (Geriatric Medicine) [name] as [facility] Management  Advanced Directive information    Allergies  Allergen Reactions  . Anoro Ellipta [Umeclidinium-Vilanterol] Other (See Comments)    Chest pain if used more than 3 consecutive days   . Codeine     REACTION: headaches  . Gabapentin     headache  . Prozac [Fluoxetine Hcl] Other (See Comments)    Suicidal ideations    Chief Complaint  Patient presents with  . Follow-up    1 week follow-up on B/P      HPI: Patient is a 63 y.o. [name] seen in the office today for follow up on blood pressure.  She was seen last week for evaluation of blood pressure was taking losartan 100 mg and metoprolol 25 mg BID and blood pressure was not controlled. norvasc 5 mg was added and now blood pressure is much better. 130-146/73-88 Low sodium diet.  No LE edema. Prior she was having headache but this has improved. Chest pains continue. Not associated with activity. Has follow up with cardiology scheduled due to chest pains. No changes in these. Reports chest pains go across chest and she will associate this with COPD but no shortness of breath. This has not changed in the last month. None today. Does not radiate   Review of Systems:  Review of Systems  Constitutional: Negative for chills and fever.  HENT:       Reports dental abscess, no fever or pain.  Respiratory: Negative for cough, sputum production and shortness of breath.        With COPD, shortness of breath and cough at baseline.  Cardiovascular: Positive for chest pain. Negative for palpitations, claudication, leg swelling and PND.  Gastrointestinal: Negative for abdominal pain, constipation, diarrhea, heartburn and vomiting.  Neurological: Positive for headaches (improved). Negative for dizziness.    Past Medical History:  Diagnosis Date  . Abnormal vaginal bleeding   . Asthma   .  Barrett's esophagus    with high grade dysplasia per endoscopy 05/2005 // followed by Dr. [name] ([facility])  . Chronic back pain   . COPD (chronic obstructive pulmonary disease) ([place])   . Diabetes mellitus without complication ([place])   . Duodenal ulcer disease    thought to be contributed at least partly by overuse of NSAIDs and salicylates (goody powdr)  . Endometrial polyp 03/2003   s/p resection in 03/2003. Path showing submucosal leiomyoma and benign  proliferative type endometrium  . Fibroid uterus    s/p myomectomy x 2  . GERD (gastroesophageal reflux disease)   . H/O failed moderate sedation   . Hiatal hernia   . History of CVA (cerebrovascular accident) 10/2000  . 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 Hyperlipidemia   . Hypertension   . Hypothyroidism    with hx of multinodular goiter (noted on neck US in 11/2002 - showing diffuse nodularity and inhomogenous texture diffusely BL)  . Internal hemorrhoids    grade 2 per colonoscopy in 09/2006 - repeat colonoscopy rec in 5-10 years.  . Migraine   . Nevus   . Personal history of failed conscious sedation 08/11/2011  . Tachycardia   . Thyroid disease   . Tobacco abuse Jan 2014   quit   Past Surgical History:  Procedure Laterality Date  . CHOLECYSTECTOMY    . COLONOSCOPY  02/07/2010  . HYSTEROSCOPY    . LAPAROSCOPIC APPENDECTOMY N/A 09/23/2015   Procedure: APPENDECTOMY LAPAROSCOPIC;  Surgeon: Georganna Skeans, MD;  Location: [facility];  Service: General;  Laterality: N/A;  . [place]   x 2 - In 1997, noted to have extensive pelvic adhesions and BL tubal obstruction  . UPPER GASTROINTESTINAL ENDOSCOPY  12/18/2009   Social History:   reports that she quit smoking about 3 years ago. Her smoking use included cigarettes. She has a 150.00 pack-year smoking history. She has never used smokeless tobacco. She reports that she does not  drink alcohol or use drugs.  Family History  Problem Relation Age of Onset  . COPD Mother   . Heart disease Father        had 5 heart attacks, first at age 110s.  Marland Kitchen COPD Father     Medications: Patient's Medications  New Prescriptions   No medications on file  Previous Medications   ALBUTEROL (PROAIR HFA) 108 (90 BASE) MCG/ACT INHALER    INHALE 2 PUFFS EVERY 6  HOURS AS NEEDED FOR  SHORTNESS OF BREATH OR  WHEEZING   ALUM & MAG HYDROXIDE-SIMETH (MAALOX/MYLANTA) 200-200-20 MG/5ML SUSPENSION    Take by mouth every 6 (six) hours as needed for indigestion or heartburn.   AMLODIPINE (NORVASC) 5 MG TABLET    Take 1 tablet (5 mg total) by mouth daily.   ASPIRIN-ACETAMINOPHEN-CAFFEINE 272-536-64 MG PACK    Take 1 Package by mouth as needed.    ATORVASTATIN (LIPITOR) 80 MG TABLET    TAKE ONE TABLET BY MOUTH  ONCE DAILY AT BEDTIME FOR  CHOLESTEROL   BUDESONIDE-FORMOTEROL (SYMBICORT) 160-4.5 MCG/ACT INHALER    Inhale 2 puffs into the lungs 2 (two) times daily for 1 day.   GLIPIZIDE (GLUCOTROL XL) 5 MG 24 HR TABLET    Take 1 tablet (5 mg total) by mouth daily with breakfast.   IBUPROFEN (ADVIL,MOTRIN) 200 MG TABLET    Take 600 mg by mouth as needed for moderate pain.    IPRATROPIUM-ALBUTEROL (DUONEB) 0.5-2.5 (3) MG/3ML SOLN    Take 3 mLs by nebulization every 6 (six) hours as needed. For shortness of breath or wheezing   LEVOTHYROXINE (SYNTHROID, LEVOTHROID) 150 MCG TABLET    Take 1 tablet (150 mcg total) by mouth daily before breakfast.   LORAZEPAM (ATIVAN) 0.5 MG TABLET    TAKE 1 TABLET BY MOUTH 3  TIMES A DAY AS NEEDED FOR  ANXIETY OR NERVOUSNESS   LOSARTAN (COZAAR) 100 MG TABLET    Take 1 tablet (100 mg total) by mouth daily.   METOPROLOL TARTRATE (LOPRESSOR) 25 MG TABLET    Take 1 tablet (25 mg total) by mouth 2 (two) times daily.   OMEPRAZOLE (PRILOSEC) 20 MG CAPSULE    Take 1 capsule (20 mg total) by mouth daily.   ONETOUCH DELICA LANCETS 40H MISC    USE TWO TIMES DAILY   ONETOUCH VERIO TEST  STRIP    CHECK BLOOD SUGAR TWO TIMES DAILY AND AS DIRECTED   TIOTROPIUM BROMIDE MONOHYDRATE (SPIRIVA RESPIMAT) 2.5 MCG/ACT AERS    Inhale 2 puffs into the lungs daily for 1 day.   TRAZODONE (DESYREL) 150 MG TABLET    Take 150 mg by mouth at bedtime. If taking lorazepam does not take this  Modified Medications   No medications on file  Discontinued Medications   No medications on file     Physical  Exam:  Vitals:   05/13/18 1444  BP: (!) 142/90  Pulse: 66  Resp: 10  Temp: 97.8 F (36.6 C)  TempSrc: Oral  SpO2: 97%  Weight: 192 lb (87.1 kg)  Height: 5\' 3"  (1.6 m)   Body mass index is 34.01 kg/m.  Physical Exam Constitutional:      General: She is not in acute distress.    Appearance: She is well-developed. She is not diaphoretic.  HENT:     Head: Normocephalic and atraumatic.  Eyes:     Conjunctiva/sclera: Conjunctivae normal.     Pupils: Pupils are equal, round, and reactive to light.  Neck:     Musculoskeletal: Normal range of motion and neck supple.  Cardiovascular:     Rate and Rhythm: Normal rate and regular rhythm.     Pulses: Normal pulses.     Heart sounds: Normal heart sounds.  Pulmonary:     Effort: Pulmonary effort is normal.     Breath sounds: Normal breath sounds.  Abdominal:     General: Bowel sounds are normal.     Palpations: Abdomen is soft.  Musculoskeletal:        General: No tenderness.  Skin:    General: Skin is warm and dry.  Neurological:     Mental Status: She is alert and oriented to person, place, and time.    Labs reviewed: Basic Metabolic Panel: Recent Labs    07/28/17 1112 10/30/17 1241 04/21/18 0918  NA 141 140 142  K 4.2 4.5 3.8  CL 106 106 106  CO2 29 26 27   GLUCOSE 109 61* 76  BUN 12 16 14   CREATININE 0.81 0.85 0.70  CALCIUM 9.3 9.3 9.0  TSH 0.33* 0.48 0.10*   Liver Function Tests: Recent Labs    07/28/17 1112 10/30/17 1241 04/21/18 0918  AST  --  19 17  ALT 13 15 16   BILITOT  --  0.3 0.3  PROT  --  6.9  6.7   No results for input(s): LIPASE, AMYLASE in the last 8760 hours. No results for input(s): AMMONIA in the last 8760 hours. CBC: Recent Labs    04/22/18 0953  WBC 7.5  NEUTROABS 4,463  HGB 13.3  HCT 39.8  MCV Grace.5  PLT 280   Lipid Panel: Recent Labs    07/28/17 1112 10/30/17 1241 04/21/18 0918  CHOL 111 122 148  HDL 45* 48* 52  LDLCALC 48 56 75  TRIG 95 98 124  CHOLHDL 2.5 2.5 2.8   TSH: Recent Labs    07/28/17 1112 10/30/17 1241 04/21/18 0918  TSH 0.33* 0.48 0.10*   A1C: Lab Results  Component Value Date   HGBA1C 6.1 (H) 04/21/2018     Assessment/Plan 1. Essential hypertension Improved with Norvasc 5 mg daily. To continue losartan and metoprolol as prescribed.   2. Chronic nonintractable headache, unspecified headache type Improved with better blood pressure control.   3. Chest pain, unspecified type On and off for ~1 year. Cardiology referral has been placed and appt for further evaluation on 05/19/2018. Instructions provided on when to go to ED   4. COPD with asthma ([place]) Stable, improvement with current regimen. Continues to follow up with pulmonary.   5. Hypothyroidism due to acquired atrophy of thyroid Synthroid recently reduced, TSH has been scheduled  Next appt: 6 months, sooner if needed  Samadhi Mahurin K. [place], [facility] [phone]

## 2018-05-13 NOTE — Patient Instructions (Addendum)
Continue on ASA 81 mg by mouth daily  Keep cardiology appt To go to the ED if chest pains worsens or changes, shortness of breath occurs

## 2018-05-17 ENCOUNTER — Other Ambulatory Visit: Payer: Self-pay | Admitting: Nurse Practitioner

## 2018-05-17 ENCOUNTER — Telehealth: Payer: Self-pay | Admitting: Internal Medicine

## 2018-05-17 DIAGNOSIS — I10 Essential (primary) hypertension: Secondary | ICD-10-CM

## 2018-05-17 MED ORDER — BUDESONIDE-FORMOTEROL FUMARATE 160-4.5 MCG/ACT IN AERO: 2.0000 | INHALATION_SPRAY | Freq: Two times a day (BID) | RESPIRATORY_TRACT | 0 refills | Status: DC

## 2018-05-17 MED ORDER — BUDESONIDE-FORMOTEROL FUMARATE 160-4.5 MCG/ACT IN AERO: INHALATION_SPRAY | RESPIRATORY_TRACT | 3 refills | Status: DC

## 2018-05-17 NOTE — Telephone Encounter (Signed)
Ativan Last refill 04/21/2018 Database verified

## 2018-05-17 NOTE — Telephone Encounter (Signed)
Called and spoke to pt.  Pt states that Rx for symbicort 160 was not received by [facility].  Per our records, Rx for symbicort 160 was printed. Pt stated that she did not received a Rx at her OV.  Pt is requesting a sample of symbicort 160, until her Rx is received by [facility].  One sample of Symbicort 160 has been placed up front for pick up.  Rx for Symbicort 160 has been sent to [facility].  Nothing further is needed.

## 2018-05-19 ENCOUNTER — Encounter: Payer: Self-pay | Admitting: Cardiology

## 2018-05-19 ENCOUNTER — Ambulatory Visit (INDEPENDENT_AMBULATORY_CARE_PROVIDER_SITE_OTHER): Payer: Medicare Other | Admitting: Cardiology

## 2018-05-19 VITALS — BP 132/78 | HR 76 | Ht 63.0 in | Wt 195.8 lb

## 2018-05-19 DIAGNOSIS — R072 Precordial pain: Secondary | ICD-10-CM

## 2018-05-19 DIAGNOSIS — Z87891 Personal history of nicotine dependence: Secondary | ICD-10-CM

## 2018-05-19 DIAGNOSIS — E785 Hyperlipidemia, unspecified: Secondary | ICD-10-CM

## 2018-05-19 DIAGNOSIS — E119 Type 2 diabetes mellitus without complications: Secondary | ICD-10-CM

## 2018-05-19 DIAGNOSIS — Q211 Atrial septal defect: Secondary | ICD-10-CM

## 2018-05-19 DIAGNOSIS — K311 Adult hypertrophic pyloric stenosis: Secondary | ICD-10-CM

## 2018-05-19 DIAGNOSIS — Q2112 Patent foramen ovale: Secondary | ICD-10-CM

## 2018-05-19 DIAGNOSIS — I10 Essential (primary) hypertension: Secondary | ICD-10-CM

## 2018-05-19 DIAGNOSIS — Z01818 Encounter for other preprocedural examination: Secondary | ICD-10-CM

## 2018-05-19 DIAGNOSIS — Z8249 Family history of ischemic heart disease and other diseases of the circulatory system: Secondary | ICD-10-CM | POA: Insufficient documentation

## 2018-05-19 DIAGNOSIS — K22719 Barrett's esophagus with dysplasia, unspecified: Secondary | ICD-10-CM

## 2018-05-19 DIAGNOSIS — J449 Chronic obstructive pulmonary disease, unspecified: Secondary | ICD-10-CM

## 2018-05-19 DIAGNOSIS — K267 Chronic duodenal ulcer without hemorrhage or perforation: Secondary | ICD-10-CM

## 2018-05-19 HISTORY — DX: Family history of ischemic heart disease and other diseases of the circulatory system: Z82.49

## 2018-05-19 NOTE — Progress Notes (Signed)
PCP: [name], NP  Clinic Note: Chief Complaint  Patient presents with  . New Patient (Initial Visit)  . Chest Pain  . Hypertension    just started treating    HPI:  Grace [name] is a 63 y.o. female w/ PMH notable for HTN who is being seen today for the evaluation of HTN & Chest Pains at the request of [name], NP.  Grace [name] was last seen on May 13, 2018 by [name], NP For BP & noted continued Chest Pains - that go across her chest.  No associated SOB (despite COPD).  Sx ongoing - on&off x 1 yr.   Recent Hospitalizations:   none  Studies Personally Reviewed - (if available, images/films reviewed: From Epic Chart or Care Everywhere)  TTE 07/04/2014: EF 55-60%.  No RWMA. Gr 1 DD. Cannot R/o PFO (consider bubble study).  Interval History: Grace [name] presents here today for evaluation of her longstanding episodic chest discomfort.  She says that she is pretty much short of breath all the time, but does get more short of breath with exertion.  She says she has off-and-on chest pain at a time during the course of the day.  She can have it while watching TV, while eating but also while active.  The usual pain is more of a tightness and squeezing sensation that will last a minute or so.  The symptom Goes across her chest from the midline out both ways.  Not necessarily associated all with any particular activity.  She also occasionally will have sharp stabbing type discomforts that radiate into her arm that lasts less than a minute.  This is different than the more frequent symptoms. The chest discomfort is not associated with any exacerbation of dyspnea.  There is no lightheadedness or dizziness associated with.  No nausea.  Although she has resting dyspnea she denies any PND orthopnea.  She may have to get up at night to catch her breath but not like PND.  She notes occasional "fluttering sensation" in her chest that is very fleeting.  Nothing lasting  more than a few seconds.  Otherwise no rapid irregular heartbeats or palpitations.   No syncope/near syncope, or TIA/amaurosis fugax symptoms.--She did have a TIA 2011 with no residual effect. No claudication --does not really walk far enough to get claudication  ROS: A comprehensive was performed. Review of Systems  Constitutional: Positive for malaise/fatigue (No real energy to do anything). Negative for chills and fever.  HENT: Negative for congestion and nosebleeds.   Respiratory: Positive for cough, shortness of breath (All the time.) and wheezing. Negative for sputum production.        Baseline for COPD  Gastrointestinal: Negative for blood in stool and melena.  Genitourinary: Negative for dysuria and hematuria.  Musculoskeletal: Positive for back pain and joint pain.  Neurological: Positive for dizziness (Sometimes a change in position). Negative for speech change, focal weakness and weakness.  Psychiatric/Behavioral: Negative for depression. The patient is not nervous/anxious and does not have insomnia.   All other systems reviewed and are negative.  Baseline dyspnea, headaches  I have reviewed and (if needed) personally updated the patient's problem list, medications, allergies, past medical and surgical history, social and family history.   Past Medical History:  Diagnosis Date  . Abnormal vaginal bleeding   . Asthma   . Barrett's esophagus    with high grade dysplasia per endoscopy 05/2005 // followed by Dr. [name] ([facility])  .  Chronic back pain   . COPD (chronic obstructive pulmonary disease) ([place])   . Diabetes mellitus without complication ([place])   . Duodenal ulcer disease    thought to be contributed at least partly by overuse of NSAIDs and salicylates (goody powdr)  . Endometrial polyp 03/2003   s/p resection in 03/2003. Path showing submucosal leiomyoma and benign  proliferative type endometrium  . Fibroid uterus    s/p myomectomy x 2  . GERD (gastroesophageal  reflux disease)   . H/O failed moderate sedation   . Hiatal hernia   . History of CVA (cerebrovascular accident) 10/2000  . 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 Hyperlipidemia   . Hypertension   . Hypothyroidism    with hx of multinodular goiter (noted on neck US in 11/2002 - showing diffuse nodularity and inhomogenous texture diffusely BL)  . Internal hemorrhoids    grade 2 per colonoscopy in 09/2006 - repeat colonoscopy rec in 5-10 years.  . Migraine   . Nevus   . Personal history of failed conscious sedation 08/11/2011  . Tachycardia   . Thyroid disease   . Tobacco abuse Jan 2014   quit    Past Surgical History:  Procedure Laterality Date  . CHOLECYSTECTOMY    . COLONOSCOPY  02/07/2010  . HYSTEROSCOPY    . LAPAROSCOPIC APPENDECTOMY N/A 09/23/2015   Procedure: APPENDECTOMY LAPAROSCOPIC;  Surgeon: Georganna Skeans, MD;  Location: [facility];  Service: General;  Laterality: N/A;  . [place]   x 2 - In 1997, noted to have extensive pelvic adhesions and BL tubal obstruction  . TRANSTHORACIC ECHOCARDIOGRAM  06/2014    EF 55-60%.  No RWMA. Gr 1 DD. Cannot R/o PFO (consider bubble study).  Marland Kitchen UPPER GASTROINTESTINAL ENDOSCOPY  12/18/2009    Current Meds  Medication Sig  . albuterol (PROAIR HFA) 108 (90 Base) MCG/ACT inhaler INHALE 2 PUFFS EVERY 6  HOURS AS NEEDED FOR  SHORTNESS OF BREATH OR  WHEEZING  . alum & mag hydroxide-simeth (MAALOX/MYLANTA) 200-200-20 MG/5ML suspension Take by mouth every 6 (six) hours as needed for indigestion or heartburn.  Marland Kitchen amLODipine (NORVASC) 5 MG tablet Take 1 tablet (5 mg total) by mouth daily.  Marland Kitchen aspirin EC 81 MG tablet Take 81 mg by mouth daily.  . Aspirin-Acetaminophen-Caffeine 700-174-94 MG PACK Take 1 Package by mouth as needed.   Marland Kitchen atorvastatin (LIPITOR) 80 MG tablet TAKE ONE TABLET BY MOUTH  ONCE DAILY AT BEDTIME FOR  CHOLESTEROL  .  budesonide-formoterol (SYMBICORT) 160-4.5 MCG/ACT inhaler 2 puffs twice daily.  Marland Kitchen glipiZIDE (GLUCOTROL XL) 5 MG 24 hr tablet Take 1 tablet (5 mg total) by mouth daily with breakfast.  . ibuprofen (ADVIL,MOTRIN) 200 MG tablet Take 600 mg by mouth as needed for moderate pain.   Marland Kitchen ipratropium-albuterol (DUONEB) 0.5-2.5 (3) MG/3ML SOLN Take 3 mLs by nebulization every 6 (six) hours as needed. For shortness of breath or wheezing  . levothyroxine (SYNTHROID, LEVOTHROID) 150 MCG tablet Take 1 tablet (150 mcg total) by mouth daily before breakfast.  . LORazepam (ATIVAN) 0.5 MG tablet TAKE 1 TABLET BY MOUTH 3  TIMES A DAY AS NEEDED FOR  ANXIETY OR NERVOUSNESS  . losartan (COZAAR) 100 MG tablet TAKE 1 TABLET BY MOUTH  DAILY  . metoprolol tartrate (LOPRESSOR) 25 MG tablet TAKE 1 TABLET BY MOUTH TWO  TIMES DAILY  . omeprazole (  PRILOSEC) 20 MG capsule Take 1 capsule (20 mg total) by mouth daily.  Glory Rosebush DELICA LANCETS 53G MISC USE TWO TIMES DAILY  . ONETOUCH VERIO test strip CHECK BLOOD SUGAR TWO TIMES DAILY AND AS DIRECTED  . Tiotropium Bromide Monohydrate (SPIRIVA RESPIMAT) 2.5 MCG/ACT AERS Inhale 2 puffs into the lungs daily for 1 day.    Allergies  Allergen Reactions  . Anoro Ellipta [Umeclidinium-Vilanterol] Other (See Comments)    Chest pain if used more than 3 consecutive days   . Codeine     REACTION: headaches  . Gabapentin     headache  . Prozac [Fluoxetine Hcl] Other (See Comments)    Suicidal ideations    Social History   Tobacco Use  . Smoking status: Former Smoker    Packs/day: 3.00    Years: 50.00    Pack years: 150.00    Types: Cigarettes    Last attempt to quit: 05/10/2015    Years since quitting: 3.0  . Smokeless tobacco: Never Used  . Tobacco comment: Counseling sheet 07-2011   Substance Use Topics  . Alcohol use: No    Alcohol/week: 0.0 standard drinks  . Drug use: No   Social History   Social History Narrative   Financial assistance approved for 100% discount  at [facility] and has [facility] card per [employer]   01/15/2010      Diet- N/A   Caffeine- Goody powders   Married-Divorced   [employer] and lives with mother   Pets-2   Current/past profession-[employer]   Exercise-No   Living will-No   DNR-No   POA/HPOA-No           family history includes COPD in her father and mother; Heart disease (age of onset: 52) in her father.  Wt Readings from Last 3 Encounters:  05/19/18 195 lb 12.8 oz (88.8 kg)  05/13/18 192 lb (87.1 kg)  05/06/18 193 lb (87.5 kg)    PHYSICAL EXAM BP 132/78 (BP Location: Left Arm, Patient Position: Sitting, Cuff Size: Large)   Pulse 76   Ht 5\' 3"  (1.6 m)   Wt 195 lb 12.8 oz (88.8 kg)   LMP 02/27/2014   BMI 34.68 kg/m  Physical Exam  Constitutional: She is oriented to person, place, and time. She appears well-developed and well-nourished. No distress.  Somewhat chronically ill-appearing.  Well-groomed.  HENT:  Head: Normocephalic and atraumatic.  Eyes: Pupils are equal, round, and reactive to light. EOM are normal. No scleral icterus.  Neck: Normal range of motion. Neck supple. No hepatojugular reflux and no JVD present. Carotid bruit is not present. No tracheal deviation present. No thyromegaly present.  Cardiovascular: Normal rate, regular rhythm, S1 normal and S2 normal.  Occasional extrasystoles are present. PMI is not displaced. Exam reveals distant heart sounds and decreased pulses (Mildly decreased pedal pulses). Exam reveals no gallop and no friction rub.  No murmur heard. Pulmonary/Chest: Effort normal. She exhibits tenderness (Reproducible discomfort along parasternal border on both sides down to the base.).  Mild use of accessory muscles.  Diffuse mild interstitial sounds, but no rales or rhonchi.  She does have some late expiratory wheezing with forced expiration.  Abdominal: Soft. Bowel sounds are normal. She exhibits no distension. There is no abdominal tenderness. There is no rebound.  No HSM  Musculoskeletal:  Normal range of motion.        General: No edema.  Lymphadenopathy:    She has no cervical adenopathy.  Neurological: She is alert and oriented to person,  place, and time. No cranial nerve deficit.  Skin: Skin is warm and dry.  Psychiatric: She has a normal mood and affect. Her behavior is normal. Judgment and thought content normal.  Vitals reviewed.   Adult ECG Report  Rate: 76 ;  Rhythm: normal sinus rhythm and Normal axis, intervals and durations.;   Narrative Interpretation: Normal EKG   Other studies Reviewed: Additional studies/ records that were reviewed today include:  Recent Labs:   Lab Results  Component Value Date   CHOL 148 04/21/2018   HDL 52 04/21/2018   LDLCALC 75 04/21/2018   LDLDIRECT 104 (H) 06/12/2010   TRIG 124 04/21/2018   CHOLHDL 2.8 04/21/2018   Lab Results  Component Value Date   CREATININE 0.70 04/21/2018   BUN 14 04/21/2018   NA 142 04/21/2018   K 3.8 04/21/2018   CL 106 04/21/2018   CO2 27 04/21/2018    ASSESSMENT / PLAN: Problem List Items Addressed This Visit    Barrett's esophagus with dysplasia (Chronic)    Potential cause for symptoms.  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  Again need to exclude coronary etiology, if negative, refer to GI.      COPD GOLD III   Essential hypertension (Chronic)    Converted off ACE inhibitor to losartan 100mg .  Recently started amlodipine at 5mg  and has been on metoprolol 25 twice daily. Blood pressure well controlled today.  She brings a readout of her blood pressures since January and there is ranges from the 130s to 150s with a few readings as high as 160s prior to starting amlodipine.  Most recently blood pressures have been somewhat better.  Would like to see her blood pressure on follow-up visit, but would potentially consider titrating up amlodipine as I be reluctant to  increase metoprolol any further with her COPD.      Relevant Medications   aspirin EC 81 MG tablet   Family history of premature CAD (Chronic)    Chest pain in a patient with significant premature CAD history with her father.  Multiple risk factors. Plan: Coronary CT angiogram with possible CT FFR.  Risk factor modification already on board with titrating up antihypertensive regimen, high-dose statin and aspirin.      Relevant Orders   EKG 12-Lead (Completed)   Basic metabolic panel   CT CORONARY MORPH W/CTA COR W/SCORE W/CA W/CM &/OR WO/CM   CT CORONARY FRACTIONAL FLOW RESERVE DATA PREP   CT CORONARY FRACTIONAL FLOW RESERVE FLUID ANALYSIS   Former heavy tobacco smoker (Chronic)    She quit smoking about 3 years ago.      Hyperlipidemia LDL goal <100 (Chronic)    Had been on rosuvastatin 40 mg.  But now is on atorvastatin 80 mg. LDL in the past have been as low as 58 while on rosuvastatin.  Somehow was switched over and now her LDL is up to 75.  Continue to monitor.  Pending results of coronary CT angiogram may want to be more aggressive and target LDL less than 70.      Relevant Medications   aspirin EC 81 MG tablet   PFO (patent foramen ovale)    Echocardiogram in the past suggested possible PFO.  Has not had any further TIA episodes.  If we do an echocardiogram, would recommend bubble study.      Relevant Medications  aspirin EC 81 MG tablet   Precordial pain - Primary (Chronic)    She has been having longstanding precordial pain symptoms dating back well over a year.  The symptoms can be worse with exertion, but can also occur at rest.  Difficult to tell if it is related to cardiac etiology (she does have risk factors of smoking family history (significant family history with father) as well as hypertension diabetes hyperlipidemia. Also cannot exclude relationship to her Barrett's esophagus/GERD and/or COPD.  With a significant family history of CAD, need to exclude  ischemic etiology. Plan: I am fearful of false positive or negative findings on a Myoview stress test, I do not think she could do a treadmill stress test.  The best evaluation will be with a coronary CT angiogram plus or minus CT FFR.  This allows for both anatomic and physiologic evaluation.  I do recommend that she stays on aspirin. From a anti-angina standpoint she is already on metoprolol and amlodipine. She is also already on high-dose atorvastatin with an LDL of 75 on last check..      Relevant Orders   EKG 12-Lead (Completed)   Basic metabolic panel   CT CORONARY MORPH W/CTA COR W/SCORE W/CA W/CM &/OR WO/CM   CT CORONARY FRACTIONAL FLOW RESERVE DATA PREP   CT CORONARY FRACTIONAL FLOW RESERVE FLUID ANALYSIS   Type 2 diabetes mellitus without complication, without long-term current use of insulin (HCC) (Chronic)   Relevant Medications   aspirin EC 81 MG tablet   Other Relevant Orders   CT CORONARY MORPH W/CTA COR W/SCORE W/CA W/CM &/OR WO/CM   CT CORONARY FRACTIONAL FLOW RESERVE DATA PREP   CT CORONARY FRACTIONAL FLOW RESERVE FLUID ANALYSIS    Other Visit Diagnoses    Pre-op testing       Relevant Orders   EKG 12-Lead (Completed)   Basic metabolic panel      I spent a total of 40-45 minutes with the patient and chart review. >  50% of the time was spent in direct patient consultation.   Current medicines are reviewed at length with the patient today.  (+/- concerns) has questions about BP meds The following changes have been made:  Blood pressures reviewed.  Plan to monitor and potentially titrate up amlodipine.  Patient Instructions  Medication Instructions:  SEE INSTRUCTION CCTA  If you need a refill on your cardiac medications before your next appointment, please call your pharmacy.   Lab work: pre CCTA BMP  Follow-Up: At [facility], you and your health needs are our priority.  As part of our continuing mission to provide you with exceptional heart care, we  have created designated Provider Care Teams.  These Care Teams include your primary Cardiologist (physician) and Advanced Practice Providers (APPs -  Physician Assistants and Nurse Practitioners) who all work together to provide you with the care you need, when you need it. . Your physician recommends that you schedule a follow-up appointment in 2 months with DR Nassim Cosma .   Studies Ordered:   Orders Placed This Encounter  Procedures  . CT CORONARY MORPH W/CTA COR W/SCORE W/CA W/CM &/OR WO/CM  . CT CORONARY FRACTIONAL FLOW RESERVE DATA PREP  . CT CORONARY FRACTIONAL FLOW RESERVE FLUID ANALYSIS  . Basic metabolic panel  . EKG 12-Lead      Glenetta Hew, M.D., M.S. Interventional Cardiologist   Pager # [phone] Phone # [phone] [address] [place], [place] [address]   Thank you for choosing Heartcare  at [facility]!!

## 2018-05-19 NOTE — Patient Instructions (Addendum)
Medication Instructions:  SEE INSTRUCTION CCTA  If you need a refill on your cardiac medications before your next appointment, please call your pharmacy.   Lab work: SEE INSTRUCTION CCTA If you have labs (blood work) drawn today and your tests are completely normal, you will receive your results only by: Marland Kitchen MyChart Message (if you have MyChart) OR . A paper copy in the mail If you have any lab test that is abnormal or we need to change your treatment, we will call you to review the results.  Testing/Procedures: SCHEDULE AT [place] Your physician has requested that you have coronary cardiac CTA. Cardiac computed tomography (CT) is a painless test that uses an x-ray machine to take clear, detailed pictures of your heart. For further information please visit [email or web address]. Please follow instruction sheet as given.     Follow-Up: At [facility], you and your health needs are our priority.  As part of our continuing mission to provide you with exceptional heart care, we have created designated Provider Care Teams.  These Care Teams include your primary Cardiologist (physician) and Advanced Practice Providers (APPs -  Physician Assistants and Nurse Practitioners) who all work together to provide you with the care you need, when you need it. . Your physician recommends that you schedule a follow-up appointment in 2 months with DR HARDING .   Any Other Special Instructions Will Be Listed Below (If Applicable).   Please arrive at the [facility] main entrance of [facility] at  (30-45 minutes prior to test start time)  [facility] [place], [place] [address] [phone]  Proceed to the [facility] Radiology Department (First Floor).  Please follow these instructions carefully (unless otherwise directed):  Please have lab work one week before  test  On the Night Before the Test: . Be sure to Drink  plenty of water. . Do not consume any caffeinated/decaffeinated beverages or chocolate 12 hours prior to your test. . Do not take any antihistamines 12 hours prior to your test. .    On the Day of the Test: . Drink plenty of water. Do not drink any water within one hour of the test. . Do not eat any food 4 hours prior to the test. . You may take your regular medications prior to the test. Do not take diabetic medication that morning . Take metoprolol (Lopressor) 50 mg ( 2 tablets) two hours prior to test.          After the Test: . Drink plenty of water. . After receiving IV contrast, you may experience a mild flushed feeling. This is normal. . On occasion, you may experience a mild rash up to 24 hours after the test. This is not dangerous. If this occurs, you can take Benadryl 25 mg and increase your fluid intake. . If you experience trouble breathing, this can be serious. If it is severe call 911 IMMEDIATELY. If it is mild, please call our office. Marland Kitchen

## 2018-05-21 ENCOUNTER — Encounter: Payer: Self-pay | Admitting: Cardiology

## 2018-05-21 NOTE — Assessment & Plan Note (Signed)
Converted off ACE inhibitor to losartan 100mg .  Recently started amlodipine at 5mg  and has been on metoprolol 25 twice daily. Blood pressure well controlled today.  She brings a readout of her blood pressures since January and there is ranges from the 130s to 150s with a few readings as high as 160s prior to starting amlodipine.  Most recently blood pressures have been somewhat better.  Would like to see her blood pressure on follow-up visit, but would potentially consider titrating up amlodipine as I be reluctant to increase metoprolol any further with her COPD.

## 2018-05-21 NOTE — Assessment & Plan Note (Signed)
Had been on rosuvastatin 40 mg.  But now is on atorvastatin 80 mg. LDL in the past have been as low as 58 while on rosuvastatin.  Somehow was switched over and now her LDL is up to 75.  Continue to monitor.  Pending results of coronary CT angiogram may want to be more aggressive and target LDL less than 70.

## 2018-05-21 NOTE — Assessment & Plan Note (Signed)
She quit smoking about 3 years ago.

## 2018-05-21 NOTE — Assessment & Plan Note (Signed)
Echocardiogram in the past suggested possible PFO.  Has not had any further TIA episodes.  If we do an echocardiogram, would recommend bubble study.

## 2018-05-21 NOTE — Assessment & Plan Note (Signed)
Potential cause for symptoms.  Once we exclude CAD, may be recommend reevaluation by GI

## 2018-05-21 NOTE — Assessment & Plan Note (Signed)
She has been having longstanding precordial pain symptoms dating back well over a year.  The symptoms can be worse with exertion, but can also occur at rest.  Difficult to tell if it is related to cardiac etiology (she does have risk factors of smoking family history (significant family history with father) as well as hypertension diabetes hyperlipidemia. Also cannot exclude relationship to her Barrett's esophagus/GERD and/or COPD.  With a significant family history of CAD, need to exclude ischemic etiology. Plan: I am fearful of false positive or negative findings on a Myoview stress test, I do not think she could do a treadmill stress test.  The best evaluation will be with a coronary CT angiogram plus or minus CT FFR.  This allows for both anatomic and physiologic evaluation.  I do recommend that she stays on aspirin. From a anti-angina standpoint she is already on metoprolol and amlodipine. She is also already on high-dose atorvastatin with an LDL of 75 on last check.Marland Kitchen

## 2018-05-21 NOTE — Assessment & Plan Note (Signed)
Chest pain in a patient with significant premature CAD history with her father.  Multiple risk factors. Plan: Coronary CT angiogram with possible CT FFR.  Risk factor modification already on board with titrating up antihypertensive regimen, high-dose statin and aspirin.

## 2018-05-21 NOTE — Assessment & Plan Note (Signed)
Also another potential source for pain.  Again need to exclude coronary etiology, if negative, refer to GI.

## 2018-05-24 ENCOUNTER — Ambulatory Visit: Payer: Self-pay

## 2018-05-28 ENCOUNTER — Ambulatory Visit: Payer: Self-pay

## 2018-05-28 ENCOUNTER — Other Ambulatory Visit: Payer: Self-pay

## 2018-05-28 NOTE — Patient Outreach (Signed)
[facility]) Care Management  05/28/2018  Grace [name] 02-19-1956 [id number]    Unsuccessful outreach to the patient for monthly follow up call. BSW left a HIPAA compliant voice message requesting a return call. BSW will plan to follow up with the patient in the next month.  [name], BSW, CDP [facility] [phone]

## 2018-05-31 ENCOUNTER — Other Ambulatory Visit: Payer: Self-pay | Admitting: Nurse Practitioner

## 2018-05-31 ENCOUNTER — Ambulatory Visit: Payer: Self-pay

## 2018-05-31 DIAGNOSIS — I10 Essential (primary) hypertension: Secondary | ICD-10-CM

## 2018-06-01 ENCOUNTER — Other Ambulatory Visit: Payer: Self-pay

## 2018-06-01 NOTE — Patient Outreach (Signed)
[facility]) Care Management  06/01/2018  Grace [name] 11/10/1955 [id number]  Incoming call from the patient returning this BSW's call. The patient reports she is doing well at this time. The patient is currently seeing a cardiologist due to concerns with blood pressure and unspecified chest pain. The patient reports concerns over her own health in comparison to her father who died at 51 years due to a heart condition. The patient has a scheduled procedure in early March for diagnostic purposes. BSW encouraged the patient to approach her upcomming procedure as a way of getting answers to help her feel better and to better manage her condition. The patient reports she has been trying to stay positive and has been keeping a daily journal. The patient further shares she has challenged herself to write in this journal daily about what she is thankful for in her life. "I'm trying to remember that everyday I've got something to be grateful for". BSW praised the patient for taking on this initiative. The patient states "I think it's helped my mood a lot".  The patient continues to work with legal aide regarding estate settlement and property tax reduction program. The patient has been in contact with legal aide who reports they are in process of locating a lawyer to assist with estate settlement pro-bono. Unfortunately, since the patients home has yet to be placed in her name, legal aide has reported the patient will not qualify for this years property tax deduction. The patient is hopeful she will have the estate settled soon to apply for next years program.  The patient further reports she has yet to be approved for Medicaid but is able to report she logged into the online portal and determined her application from August 2019 had been denied. The patient has printed another application and completed for submission. The patient reports she plans to purchase stamps and submit. BSW reminded  the patient to make copies of all medicaid bills and include with her application. The patient stated understanding.   The patient continues to attend weekly church services at "[place]". The patient reports she has began receiving meals from her church who obtains food from [employer]. The patient is happy to report "I've got a freezer full of chicken and meat". The patient denies food insecurities during today's call as she has reliable weekly source of food from her church congregation.  BSW discussed plan of care with the patient. The patient would like BSW to continue monthly follow up calls to assist with community resource needs. The patient has this BSW's contact number and is aware she may call if assistance is needed prior to the next scheduled call.  [name], BSW, CDP [facility] [phone]

## 2018-06-03 ENCOUNTER — Other Ambulatory Visit: Payer: Self-pay | Admitting: Nurse Practitioner

## 2018-06-03 DIAGNOSIS — I10 Essential (primary) hypertension: Secondary | ICD-10-CM

## 2018-06-17 ENCOUNTER — Other Ambulatory Visit: Payer: Medicare Other

## 2018-06-17 ENCOUNTER — Ambulatory Visit (HOSPITAL_COMMUNITY): Payer: Medicare Other

## 2018-06-17 DIAGNOSIS — E034 Atrophy of thyroid (acquired): Secondary | ICD-10-CM | POA: Diagnosis not present

## 2018-06-17 LAB — TSH: TSH: 1.51 mIU/L (ref 0.40–4.50)

## 2018-06-18 ENCOUNTER — Ambulatory Visit: Payer: Self-pay

## 2018-06-24 ENCOUNTER — Ambulatory Visit (HOSPITAL_COMMUNITY): Payer: Medicare Other

## 2018-06-24 ENCOUNTER — Ambulatory Visit: Payer: Self-pay

## 2018-07-01 ENCOUNTER — Other Ambulatory Visit: Payer: Self-pay

## 2018-07-01 ENCOUNTER — Ambulatory Visit: Payer: Self-pay

## 2018-07-01 NOTE — Patient Outreach (Signed)
[facility]) Care Management  07/01/2018  Grace [name] 1955-08-31 [id number]  Unsuccessful outreach to the patient in an effort to check in on status of community resource needs. BSW left a HIPAA compliant voice message requesting a return call.  Plan: BSW will attempt to outreach the patient within the next 3 weeks.  [name], BSW, CDP [facility] [phone]

## 2018-07-05 ENCOUNTER — Other Ambulatory Visit: Payer: Self-pay

## 2018-07-05 NOTE — Patient Outreach (Signed)
[facility]) Care Management  07/05/2018  Grace [name] February 26, 1956 [id number]  Incoming call from the patient, HIPAA identifiers confirmed. 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 The patient is able to confirm she has received notification from [place] that she will continue to receive Medicaid assistance in covering Medicare premiums.  BSW discussed recent transition to a new role within [facility]. BSW to perform as case closure as the patient denies the need to work on other goals at this time with a [facility] CSW. The patient understands she may call this BSW in the future if needs arise.   [name], BSW, CDP [facility] [phone]

## 2018-07-13 ENCOUNTER — Encounter: Payer: Self-pay | Admitting: Nurse Practitioner

## 2018-07-13 NOTE — Telephone Encounter (Signed)
Routed to [name], NP

## 2018-07-14 ENCOUNTER — Other Ambulatory Visit: Payer: Self-pay

## 2018-07-14 ENCOUNTER — Encounter: Payer: Self-pay | Admitting: Nurse Practitioner

## 2018-07-14 ENCOUNTER — Ambulatory Visit (INDEPENDENT_AMBULATORY_CARE_PROVIDER_SITE_OTHER): Payer: Medicare Other | Admitting: Nurse Practitioner

## 2018-07-14 DIAGNOSIS — R252 Cramp and spasm: Secondary | ICD-10-CM

## 2018-07-14 DIAGNOSIS — I10 Essential (primary) hypertension: Secondary | ICD-10-CM

## 2018-07-14 MED ORDER — METOPROLOL TARTRATE 50 MG PO TABS: 50.0000 mg | ORAL_TABLET | Freq: Two times a day (BID) | ORAL | 1 refills | Status: DC

## 2018-07-14 NOTE — Patient Instructions (Addendum)
stretches before bed Massage with icy/hot every night . To get labs in office  Stop norvasc  Increase metoprolol to 50 mg by mouth twice daily Cont to take blood pressure and pulse Goal bp <140/90, HR 60-90   Leg Cramps Leg cramps occur when one or more muscles tighten and you have no control over this tightening (involuntary muscle contraction). Muscle cramps can develop in any muscle, but the most common place is in the calf muscles of the leg. Those cramps can occur during exercise or when you are at rest. Leg cramps are painful, and they may last for a few seconds to a few minutes. Cramps may return several times before they finally stop. Usually, leg cramps are not caused by a serious medical problem. In many cases, the cause is not known. Some common causes include:  Excessive physical effort (overexertion), such as during intense exercise.  Overuse from repetitive motions, or doing the same thing over and over.  Staying in a certain position for a long period of time.  Improper preparation, form, or technique while performing a sport or an activity.  Dehydration.  Injury.  Side effects of certain medicines.  Abnormally low levels of minerals in your blood (electrolytes), especially potassium and calcium. This could result from: ? Pregnancy. ? Taking diuretic medicines. Follow these instructions at home: Eating and drinking  Drink enough fluid to keep your urine pale yellow. Staying hydrated may help prevent cramps.  Eat a healthy diet that includes plenty of nutrients to help your muscles function. A healthy diet includes fruits and vegetables, lean protein, whole grains, and low-fat or nonfat dairy products. Managing pain, stiffness, and swelling      Try massaging, stretching, and relaxing the affected muscle. Do this for several minutes at a time.  If directed, put ice on areas that are sore or painful after a cramp: ? Put ice in a plastic bag. ? Place a towel  between your skin and the bag. ? Leave the ice on for 20 minutes, 2-3 times a day.  If directed, apply heat to muscles that are tense or tight. Do this before you exercise, or as often as told by your health care provider. Use the heat source that your health care provider recommends, such as a moist heat pack or a heating pad. ? Place a towel between your skin and the heat source. ? Leave the heat on for 20-30 minutes. ? Remove the heat if your skin turns bright red. This is especially important if you are unable to feel pain, heat, or cold. You may have a greater risk of getting burned.  Try taking hot showers or baths to help relax tight muscles. General instructions  If you are having frequent leg cramps, avoid intense exercise for several days.  Take over-the-counter and prescription medicines only as told by your health care provider.  Keep all follow-up visits as told by your health care provider. This is important. Contact a health care provider if:  Your leg cramps get more severe or more frequent, or they do not improve over time.  Your foot becomes cold, numb, or blue. Summary  Muscle cramps can develop in any muscle, but the most common place is in the calf muscles of the leg.  Leg cramps are painful, and they may last for a few seconds to a few minutes.  Usually, leg cramps are not caused by a serious medical problem. Often, the cause is not known.  Stay hydrated and  take over-the-counter and prescription medicines only as told by your health care provider. This information is not intended to replace advice given to you by your health care provider. Make sure you discuss any questions you have with your health care provider. Document Released: 05/08/2004 Document Revised: 01/08/2017 Document Reviewed: 01/08/2017 Elsevier Interactive Patient Education  2019 [employer].

## 2018-07-14 NOTE — Progress Notes (Signed)
This service is provided via telemedicine  No vital signs collected/recorded due to the encounter was a telemedicine visit.   Location of patient (ex: home, work):  Home  Patient consents to a telephone visit:  Yes  Location of the provider (ex: office, home):  [employer], Office   Names of all persons participating in the telemedicine service and their role in the encounter: S.Chrae B/CMA, Sherrie Mustache, NP, and Patient     Virtual Visit via Telephone Note  I connected with Grace [name] on 07/14/18 at 12:00 PM EDT by telephone and verified that I am speaking with the correct person using two identifiers.   I discussed the limitations, risks, security and privacy concerns of performing an evaluation and management service by telephone and the availability of in person appointments. I also discussed with the patient that there may be a patient responsible charge related to this service. The patient expressed understanding and agreed to proceed.      Careteam: Patient Care Team: [name], NP as PCP - General (Geriatric Medicine)  Advanced Directive information    Allergies  Allergen Reactions  . Anoro Ellipta [Umeclidinium-Vilanterol] Other (See Comments)    Chest pain if used more than 3 consecutive days   . Codeine     REACTION: headaches  . Gabapentin     headache  . Prozac [Fluoxetine Hcl] Other (See Comments)    Suicidal ideations    Chief Complaint  Patient presents with  . Acute Visit    Patient c/o foot and leg cramps      HPI: Patient is a 63 y.o. female  She reports she has been having bad leg cramps for the last few months and progressively getting worse.  Having cramps every night.  Thought it was due to lack of exercise but she has been trying to do more and stretches.  Tightening of the muscles. No numbness or tingling.  Has hx of leg cramps. Put ivory soap in her bed sheets that helped before.  Taking mustard, pickle  juice She is doing roll on icyhot Starts in her toe and will move up her calf. Sometimes it will be in 1 leg (can be either leg) sometimes in both.  Progresses quickly. Normally happens around 4-5 am.  Last night she was able to massage in out before they got severe.   Questions if norvasc (which she most recently started) has something to do with it. Reports symptoms got worse when this was added.  Review of Systems:  Review of Systems  Constitutional: Negative for chills, fever and malaise/fatigue.  Respiratory: Positive for cough (chronic). Negative for shortness of breath.   Cardiovascular: Negative for chest pain and palpitations.  Musculoskeletal: Negative for falls.       Leg cramps    Past Medical History:  Diagnosis Date  . Abnormal vaginal bleeding   . Asthma   . Barrett's esophagus    with high grade dysplasia per endoscopy 05/2005 // followed by Dr. [name] ([facility])  . Chronic back pain   . COPD (chronic obstructive pulmonary disease) ([place])   . Diabetes mellitus without complication ([place])   . Duodenal ulcer disease    thought to be contributed at least partly by overuse of NSAIDs and salicylates (goody powdr)  . Endometrial polyp 03/2003   s/p resection in 03/2003. Path showing submucosal leiomyoma and benign  proliferative type endometrium  . Fibroid uterus    s/p myomectomy x 2  .  GERD (gastroesophageal reflux disease)   . H/O failed moderate sedation   . Hiatal hernia   . History of CVA (cerebrovascular accident) 10/2000  . 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 Hyperlipidemia   . Hypertension   . Hypothyroidism    with hx of multinodular goiter (noted on neck US in 11/2002 - showing diffuse nodularity and inhomogenous texture diffusely BL)  . Internal hemorrhoids    grade 2 per colonoscopy in 09/2006 - repeat colonoscopy rec in 5-10 years.  . Migraine   . Nevus    . Personal history of failed conscious sedation 08/11/2011  . Tachycardia   . Thyroid disease   . Tobacco abuse Jan 2014   quit   Past Surgical History:  Procedure Laterality Date  . CHOLECYSTECTOMY    . COLONOSCOPY  02/07/2010  . HYSTEROSCOPY    . LAPAROSCOPIC APPENDECTOMY N/A 09/23/2015   Procedure: APPENDECTOMY LAPAROSCOPIC;  Surgeon: Georganna Skeans, MD;  Location: [facility];  Service: General;  Laterality: N/A;  . [place]   x 2 - In 1997, noted to have extensive pelvic adhesions and BL tubal obstruction  . TRANSTHORACIC ECHOCARDIOGRAM  06/2014    EF 55-60%.  No RWMA. Gr 1 DD. Cannot R/o PFO (consider bubble study).  Marland Kitchen UPPER GASTROINTESTINAL ENDOSCOPY  12/18/2009   Social History:   reports that she quit smoking about 3 years ago. Her smoking use included cigarettes. She has a 150.00 pack-year smoking history. She has never used smokeless tobacco. She reports that she does not drink alcohol or use drugs.  Family History  Problem Relation Age of Onset  . COPD Mother   . Heart disease Father 80       had 5 heart attacks, first in late 60s  . COPD Father     Medications: Patient's Medications  New Prescriptions   No medications on file  Previous Medications   ALBUTEROL (PROAIR HFA) 108 (90 BASE) MCG/ACT INHALER    INHALE 2 PUFFS EVERY 6  HOURS AS NEEDED FOR  SHORTNESS OF BREATH OR  WHEEZING   ALUM & MAG HYDROXIDE-SIMETH (MAALOX/MYLANTA) 200-200-20 MG/5ML SUSPENSION    Take by mouth every 6 (six) hours as needed for indigestion or heartburn.   AMLODIPINE (NORVASC) 5 MG TABLET    TAKE 1 TABLET BY MOUTH ONCE DAILY   ASPIRIN EC 81 MG TABLET    Take 81 mg by mouth daily.   ASPIRIN-ACETAMINOPHEN-CAFFEINE 366-294-76 MG PACK    Take 1 Package by mouth as needed.    ATORVASTATIN (LIPITOR) 80 MG TABLET    TAKE ONE TABLET BY MOUTH  ONCE DAILY AT BEDTIME FOR  CHOLESTEROL   BIOTIN (BIOTIN MAXIMUM) 10000 MCG TBDP    Take by mouth daily.   BUDESONIDE-FORMOTEROL (SYMBICORT) 160-4.5  MCG/ACT INHALER    2 puffs twice daily.   GLIPIZIDE (GLUCOTROL XL) 5 MG 24 HR TABLET    Take 1 tablet (5 mg total) by mouth daily with breakfast.   IBUPROFEN (ADVIL,MOTRIN) 200 MG TABLET    Take 600 mg by mouth as needed for moderate pain.    IPRATROPIUM-ALBUTEROL (DUONEB) 0.5-2.5 (3) MG/3ML SOLN    Take 3 mLs by nebulization every 6 (six) hours as needed. For shortness of breath or wheezing   LEVOTHYROXINE (SYNTHROID, LEVOTHROID) 150 MCG TABLET    Take 1 tablet (150 mcg total) by mouth daily  before breakfast.   LORAZEPAM (ATIVAN) 0.5 MG TABLET    TAKE 1 TABLET BY MOUTH 3  TIMES A DAY AS NEEDED FOR  ANXIETY OR NERVOUSNESS   LOSARTAN (COZAAR) 100 MG TABLET    TAKE 1 TABLET BY MOUTH  DAILY   METOPROLOL TARTRATE (LOPRESSOR) 25 MG TABLET    TAKE 1 TABLET BY MOUTH TWO  TIMES DAILY   MULTIPLE VITAMINS-MINERALS (HAIR/SKIN/NAILS) TABS    Take by mouth daily.   OMEPRAZOLE (PRILOSEC) 20 MG CAPSULE    Take 1 capsule (20 mg total) by mouth daily.   ONETOUCH DELICA LANCETS 60V MISC    USE TWO TIMES DAILY   ONETOUCH VERIO TEST STRIP    CHECK BLOOD SUGAR TWO TIMES DAILY AND AS DIRECTED   TIOTROPIUM BROMIDE MONOHYDRATE (SPIRIVA RESPIMAT) 2.5 MCG/ACT AERS    Inhale 2 puffs into the lungs daily for 1 day.  Modified Medications   No medications on file  Discontinued Medications   No medications on file     Physical Exam: Unable due to televisit   Labs reviewed: Basic Metabolic Panel: Recent Labs    07/28/17 1112 10/30/17 1241 04/21/18 0918 06/17/18 0825  NA 141 140 142  --   K 4.2 4.5 3.8  --   CL 106 106 106  --   CO2 29 26 27   --   GLUCOSE 109 61* 76  --   BUN 12 16 14   --   CREATININE 0.81 0.85 0.70  --   CALCIUM 9.3 9.3 9.0  --   TSH 0.33* 0.48 0.10* 1.51   Liver Function Tests: Recent Labs    07/28/17 1112 10/30/17 1241 04/21/18 0918  AST  --  19 17  ALT 13 15 16   BILITOT  --  0.3 0.3  PROT  --  6.9 6.7   No results for input(s): LIPASE, AMYLASE in the last 8760 hours. No  results for input(s): AMMONIA in the last 8760 hours. CBC: Recent Labs    04/22/18 0953  WBC 7.5  NEUTROABS 4,463  HGB 13.3  HCT 39.8  MCV 84.5  PLT 280   Lipid Panel: Recent Labs    07/28/17 1112 10/30/17 1241 04/21/18 0918  CHOL 111 122 148  HDL 45* 48* 52  LDLCALC 48 56 75  TRIG 95 98 124  CHOLHDL 2.5 2.5 2.8   TSH: Recent Labs    10/30/17 1241 04/21/18 0918 06/17/18 0825  TSH 0.48 0.10* 1.51   A1C: Lab Results  Component Value Date   HGBA1C 6.1 (H) 04/21/2018     Assessment/Plan 1. Bilateral leg cramps -reports she feel like worsening of cramps when started Norvasc; will have her stop Norvasc at this time as it may be contributing to leg cramps.  To continue stretching and massage with muscle rub prior to bedtime.  - COMPLETE METABOLIC PANEL WITH GFR; Future - TSH; Future  2. Essential hypertension Will stop norvasc, increase metoprolol to 50 mg by mouth twice daily for blood pressure. To monitor blood pressure and HR.  Next appt: 2 weeks for tele-visit follow up [place]. Harle Battiest  [facility] [phone]   Follow Up Instructions:    I discussed the assessment and treatment plan with the patient. The patient was provided an opportunity to ask questions and all were answered. The patient agreed with the plan and demonstrated an understanding of the instructions.   The patient was advised to call back or seek an in-person evaluation if  the symptoms worsen or if the condition fails to improve as anticipated.  I provided 16 minutes of non-face-to-face time during this encounter.   [name], NP

## 2018-07-15 ENCOUNTER — Telehealth: Payer: Self-pay | Admitting: *Deleted

## 2018-07-15 NOTE — Telephone Encounter (Signed)
LEFT MESSAGE TO CALL BACK - TO RESCHEDULE /POST PONE 4/6/APPT.

## 2018-07-16 ENCOUNTER — Telehealth: Payer: Self-pay

## 2018-07-16 NOTE — Telephone Encounter (Signed)
Virtual Visit Pre-Appointment Phone Call  Steps For Call:  1. Confirm consent - "In the setting of the current Covid19 crisis, you are scheduled for a (phone or video) visit with your provider on (date) at (time).  Just as we do with many in-office visits, in order for you to participate in this visit, we must obtain consent.  If you'd like, I can send this to your mychart (if signed up) or email for you to review.  Otherwise, I can obtain your verbal consent now.  All virtual visits are billed to your insurance company just like a normal visit would be.  By agreeing to a virtual visit, we'd like you to understand that the technology does not allow for your provider to perform an examination, and thus may limit your provider's ability to fully assess your condition.  Finally, though the technology is pretty good, we cannot assure that it will always work on either your or our end, and in the setting of a video visit, we may have to convert it to a phone-only visit.  In either situation, we cannot ensure that we have a secure connection.  Are you willing to proceed?"    TELEPHONE CALL NOTE  Grace [name] has been deemed a candidate for a follow-up tele-health visit to limit community exposure during the Covid-19 pandemic. I spoke with the patient via phone to ensure availability of phone/video source, confirm preferred email & phone number, and discuss instructions and expectations.  I reminded Grace [name] to be prepared with any vital sign and/or heart rhythm information that could potentially be obtained via home monitoring, at the time of her visit. I reminded Grace [name] to expect a phone call at the time of her visit if her visit.  Did the patient verbally acknowledge consent to treatment? yes  Grace [name], CMA 07/16/2018 2:50 PM  CONSENT FOR TELE-HEALTH VISIT - PLEASE REVIEW  I hereby voluntarily request, consent and authorize [facility] and its employed or contracted  physicians, physician assistants, nurse practitioners or other licensed health care professionals (the Practitioner), to provide me with telemedicine health care services (the "Services") as deemed necessary by the treating Practitioner. I acknowledge and consent to receive the Services by the Practitioner via telemedicine. I understand that the telemedicine visit will involve communicating with the Practitioner through live audiovisual communication technology and the disclosure of certain medical information by electronic transmission. I acknowledge that I have been given the opportunity to request an in-person assessment or other available alternative prior to the telemedicine visit and am voluntarily participating in the telemedicine visit.  I understand that I have the right to withhold or withdraw my consent to the use of telemedicine in the course of my care at any time, without affecting my right to future care or treatment, and that the Practitioner or I may terminate the telemedicine visit at any time. I understand that I have the right to inspect all information obtained and/or recorded in the course of the telemedicine visit and may receive copies of available information for a reasonable fee.  I understand that some of the potential risks of receiving the Services via telemedicine include:  Marland Kitchen Delay or interruption in medical evaluation due to technological equipment failure or disruption; . Information transmitted may not be sufficient (e.g. poor resolution of images) to allow for appropriate medical decision making by the Practitioner; and/or  . In rare instances, security protocols could fail, causing a breach of personal  health information.  Furthermore, I acknowledge that it is my responsibility to provide information about my medical history, conditions and care that is complete and accurate to the best of my ability. I acknowledge that Practitioner's advice, recommendations, and/or decision  may be based on factors not within their control, such as incomplete or inaccurate data provided by me or distortions of diagnostic images or specimens that may result from electronic transmissions. I understand that the practice of medicine is not an exact science and that Practitioner makes no warranties or guarantees regarding treatment outcomes. I acknowledge that I will receive a copy of this consent concurrently upon execution via email to the email address I last provided but may also request a printed copy by calling the office of [facility].    I understand that my insurance will be billed for this visit.   I have read or had this consent read to me. . I understand the contents of this consent, which adequately explains the benefits and risks of the Services being provided via telemedicine.  . I have been provided ample opportunity to ask questions regarding this consent and the Services and have had my questions answered to my satisfaction. . I give my informed consent for the services to be provided through the use of telemedicine in my medical care  By participating in this telemedicine visit I agree to the above.

## 2018-07-16 NOTE — Telephone Encounter (Signed)
Patient stated that while taking amlodipine she has had really bad leg cramps and that her primary car provider has discontinued and has her taking 1 losartan and one metoporol in the morning and 2 metoporol at night and she notice her blood pressure has been high in the morning it is 178/98 this morning(07/16/18) and want to discuss her options via virtual visit with dr. Ellyn Hack

## 2018-07-18 NOTE — Progress Notes (Signed)
Virtual Visit via Telephone Note   This visit type was conducted due to national recommendations for restrictions regarding the COVID-19 Pandemic (e.g. social distancing) in an effort to limit this patient's exposure and mitigate transmission in our community.  Due to her co-morbid illnesses, this patient is at least at moderate risk for complications without adequate follow up.  This format is felt to be most appropriate for this patient at this time.  All issues noted in this document were discussed and addressed.  A limited physical exam was performed with this format.  Please refer to the patient's chart for her consent to telehealth for [facility].  Evaluation Performed:  Follow-up visit  This visit type was conducted due to national recommendations for restrictions regarding the COVID-19 Pandemic (e.g. social distancing).  This format is felt to be most appropriate for this patient at this time.  All issues noted in this document were discussed and addressed.  No physical exam was performed (except for noted visual exam findings with Video Visits).  Please refer to the patient's chart (MyChart message for video visits and phone note for telephone visits) for the patient's consent to telehealth for [facility].   TELEPHONE CALL NOTE  WADE SIGALA has been deemed a candidate for a follow-up tele-health visit to limit community exposure during the Covid-19 pandemic. I spoke with the patient via phone to ensure availability of phone/video source, confirm preferred email & phone number, and discuss instructions and expectations.  I reminded KAILEE ESSMAN to be prepared with any vital sign and/or heart rhythm information that could potentially be obtained via home monitoring, at the time of her visit. I reminded BRITAINY KOZUB to expect a phone call at the time of her visit if her visit.  Did the patient verbally acknowledge consent to treatment? yes  [name], CMA 07/16/2018  2:50 PM  Unfortunately, due to difficulty with the patient computer, she has asked to convert to telephone visit.  Date:  07/19/2018   ID:  Grace [name], DOB March 02, 1956, MRN [id number]  Patient Location:  [place], [place] [facility])  Provider location:   [place], [place] Office  PCP:  [name], NP  Cardiologist:  Glenetta Hew, MD  Electrophysiologist:  None   Chief Complaint:  F/u CP & HTN  History of Present Illness:    Grace [name] is a 63 y.o. female who presents via audio/video conferencing for a telehealth visit today.  This is a 77-month follow-up from initial consultation visit on May 19, 2018 when she was seen for evaluation of continuous dyspnea as well as off-and-on precordial chest pain.  She does have a family history of CAD and cardiac risk factors.  The plan was for her to be evaluated with a coronary CTA, however this was delayed due to COVID-19 restrictions. She also noted occasional flutters.  Nothing prolonged. Review of systems was positive for fatigue and chronic dyspnea.  Also chronic cough and wheezing -with a diagnosis of gold 3 COPD.  Some dizziness.  1 of the major issue was hypertension.  She had been converted from an ACE inhibitor to losartan 100 mg, recently started on amlodipine 5 mg and metoprolol 25 mg twice daily.  She recently had telemetry visit with Sherrie Mustache, NP (April 1) with complaints of leg cramps, tightening muscles but also hypertension.  She attributed the cramping to having recently started Norvasc (unlikely).  CMP was checked.  Metoprolol was increased to  50 mg twice daily. -->  The plan was for her to follow-up in 2 weeks with just Sherrie Mustache, however she wanted to continue with this visit despite not having the test done. --> Apparently, since stopping the amlodipine, her blood pressures have been very high -by her report was 178/98 mmHg on 07/16/2018 - but lower in night.   INTERVAL HISTORY :  Mostly concerned RE: BP.  She is been doing relatively well otherwise.  Has not really had that much in the way of any chest discomfort.  She was able to mow the lawn last week.  It took her 2 days (because it had not been loaded in several weeks) -she was more troubled by issues with breathing because of seasonal allergies then any chest pain.  Mostly noted dyspnea not chest pain.  Still having off and on little twinges of chest pain, not associated with any particular activity or exertion.  Since being off Amlodipine no more cramps.  However she notes that her blood pressures have been somewhat high in the morning as opposed in the evening.  Usually well controlled in the evenings and up in the 110 to 120 mmHg range.  Cardiovascular ROS: positive for - stable DOE.  off & on sudden "twinges" of CP (not associated with anything particular). negative for - irregular heartbeat, loss of consciousness, orthopnea, palpitations, paroxysmal nocturnal dyspnea or rapid heart rate - just sleeps with a couple pillows for comfort.   Symbicort & Spiriva help dyspnea - not using rescue inhaler much.   ROS:  Please see the history of present illness.    Review of Systems  Constitutional: Negative for chills, fever and malaise/fatigue.  HENT: Negative for congestion and nosebleeds.   Respiratory: Positive for cough (a little in mornings - dry cough, years) and shortness of breath (stable ). Negative for sputum production and wheezing (once & a while).   Cardiovascular: Negative for claudication.  Gastrointestinal: Negative for blood in stool and melena.  Genitourinary: Negative for hematuria.  Musculoskeletal: Negative for falls and joint pain.  Neurological: Positive for headaches (When BP elevated.). Negative for speech change, focal weakness, loss of consciousness and weakness.  Psychiatric/Behavioral: Negative for depression and memory loss. The patient is not nervous/anxious (has been doing well) and does  not have insomnia.   All other systems reviewed and are negative.   The patient does not have symptoms concerning for COVID-19 infection (fever, chills, cough, or new shortness of breath).    Prior CV studies:   The following studies were reviewed today: -- Coronary CTA ordered - delayed due to COVID-19 related restrictions  Past Medical History:  Diagnosis Date  . Abnormal vaginal bleeding   . Asthma   . Barrett's esophagus    with high grade dysplasia per endoscopy 05/2005 // followed by Dr. [name] ([facility])  . Chronic back pain   . COPD (chronic obstructive pulmonary disease) ([place])   . Diabetes mellitus without complication ([place])   . Duodenal ulcer disease    thought to be contributed at least partly by overuse of NSAIDs and salicylates (goody powdr)  . Endometrial polyp 03/2003   s/p resection in 03/2003. Path showing submucosal leiomyoma and benign  proliferative type endometrium  . Fibroid uterus    s/p myomectomy x 2  . GERD (gastroesophageal reflux disease)   . H/O failed moderate sedation   . Hiatal hernia   . History of CVA (cerebrovascular accident) 10/2000  . 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 Hyperlipidemia   . Hypertension   . Hypothyroidism    with hx of multinodular goiter (noted on neck US in 11/2002 - showing diffuse nodularity and inhomogenous texture diffusely BL)  . Internal hemorrhoids    grade 2 per colonoscopy in 09/2006 - repeat colonoscopy rec in 5-10 years.  . Migraine   . Nevus   . Personal history of failed conscious sedation 08/11/2011  . Tachycardia   . Thyroid disease   . Tobacco abuse Jan 2014   quit   Past Surgical History:  Procedure Laterality Date  . CHOLECYSTECTOMY    . COLONOSCOPY  02/07/2010  . HYSTEROSCOPY    . LAPAROSCOPIC APPENDECTOMY N/A 09/23/2015   Procedure: APPENDECTOMY LAPAROSCOPIC;  Surgeon: Georganna Skeans, MD;   Location: [facility];  Service: General;  Laterality: N/A;  . [place]   x 2 - In 1997, noted to have extensive pelvic adhesions and BL tubal obstruction  . TRANSTHORACIC ECHOCARDIOGRAM  06/2014    EF 55-60%.  No RWMA. Gr 1 DD. Cannot R/o PFO (consider bubble study).  Marland Kitchen UPPER GASTROINTESTINAL ENDOSCOPY  12/18/2009     Current Meds  Medication Sig  . albuterol (PROAIR HFA) 108 (90 Base) MCG/ACT inhaler INHALE 2 PUFFS EVERY 6  HOURS AS NEEDED FOR  SHORTNESS OF BREATH OR  WHEEZING  . alum & mag hydroxide-simeth (MAALOX/MYLANTA) 200-200-20 MG/5ML suspension Take by mouth every 6 (six) hours as needed for indigestion or heartburn.  Marland Kitchen aspirin EC 81 MG tablet Take 81 mg by mouth daily.  . Aspirin-Acetaminophen-Caffeine 599-357-01 MG PACK Take 1 Package by mouth as needed.   Marland Kitchen atorvastatin (LIPITOR) 80 MG tablet TAKE ONE TABLET BY MOUTH  ONCE DAILY AT BEDTIME FOR  CHOLESTEROL  . Biotin (BIOTIN MAXIMUM) 10000 MCG TBDP Take by mouth daily.  . budesonide-formoterol (SYMBICORT) 160-4.5 MCG/ACT inhaler 2 puffs twice daily.  Marland Kitchen glipiZIDE (GLUCOTROL XL) 5 MG 24 hr tablet Take 1 tablet (5 mg total) by mouth daily with breakfast.  . ibuprofen (ADVIL,MOTRIN) 200 MG tablet Take 600 mg by mouth as needed for moderate pain.   Marland Kitchen ipratropium-albuterol (DUONEB) 0.5-2.5 (3) MG/3ML SOLN Take 3 mLs by nebulization every 6 (six) hours as needed. For shortness of breath or wheezing  . levothyroxine (SYNTHROID, LEVOTHROID) 150 MCG tablet Take 1 tablet (150 mcg total) by mouth daily before breakfast.  . LORazepam (ATIVAN) 0.5 MG tablet TAKE 1 TABLET BY MOUTH 3  TIMES A DAY AS NEEDED FOR  ANXIETY OR NERVOUSNESS  . losartan (COZAAR) 100 MG tablet TAKE 1 TABLET BY MOUTH  DAILY  . metoprolol tartrate (LOPRESSOR) 50 MG tablet Take 1 tablet (50 mg total) by mouth 2 (two) times daily.  . Multiple Vitamins-Minerals (HAIR/SKIN/NAILS) TABS Take by mouth daily.  Marland Kitchen omeprazole (PRILOSEC) 20 MG capsule Take 1 capsule (20 mg  total) by mouth daily.  Glory Rosebush DELICA LANCETS 77L MISC USE TWO TIMES DAILY  . ONETOUCH VERIO test strip CHECK BLOOD SUGAR TWO TIMES DAILY AND AS DIRECTED  . Tiotropium Bromide Monohydrate (SPIRIVA RESPIMAT) 2.5 MCG/ACT AERS Inhale 2 puffs into the lungs daily for 1 day.  . [DISCONTINUED] metoprolol tartrate (LOPRESSOR) 50 MG tablet Take 1 tablet (50 mg total) by mouth 2 (two) times daily.    Taking Metoprolol 25 mg AM & 50 mg PM.   Allergies:   Amlodipine; Anoro ellipta [umeclidinium-vilanterol]; Codeine; Gabapentin;  and Prozac [fluoxetine hcl]   Social History   Tobacco Use  . Smoking status: Former Smoker    Packs/day: 3.00    Years: 50.00    Pack years: 150.00    Types: Cigarettes    Last attempt to quit: 05/10/2015    Years since quitting: 3.1  . Smokeless tobacco: Never Used  . Tobacco comment: Counseling sheet 07-2011   Substance Use Topics  . Alcohol use: No    Alcohol/week: 0.0 standard drinks  . Drug use: No     Family Hx: The patient's family history includes COPD in her father and mother; Heart disease (age of onset: 4) in her father.   Labs/Other Tests and Data Reviewed:    Recent Labs: 04/21/2018: ALT 16; BUN 14; Creat 0.70; Potassium 3.8; Sodium 142 04/22/2018: Hemoglobin 13.3; Platelets 280 06/17/2018: TSH 1.51   Recent Lipid Panel  Lab Results  Component Value Date   CREATININE 0.70 04/21/2018   BUN 14 04/21/2018   NA 142 04/21/2018   K 3.8 04/21/2018   CL 106 04/21/2018   CO2 27 04/21/2018    Lab Results  Component Value Date   CHOL 148 04/21/2018   HDL 52 04/21/2018   LDLCALC 75 04/21/2018   LDLDIRECT 104 (H) 06/12/2010   TRIG 124 04/21/2018   CHOLHDL 2.8 04/21/2018     Wt Readings from Last 3 Encounters:  07/19/18 200 lb (90.7 kg)  05/19/18 195 lb 12.8 oz (88.8 kg)  05/13/18 192 lb (87.1 kg)     Objective:    LIMITED VISUAL EXAM Vital Signs:  BP (!) 158/89   Pulse 76   Ht 5\' 3"  (1.6 m)   Wt 200 lb (90.7 kg)   LMP 02/27/2014    BMI 35.43 kg/m    Sounds well over the phone.  NAD.  No respiratory distress. A&O x 3.     ASSESSMENT & PLAN:    Problem List Items Addressed This Visit    Essential hypertension - Primary (Chronic)    BP medications: Take Losartan at bedtime (has been taking 50 mg Metoprolol in PM & 25 in AM) -- change to 50 mg BID.  Keep [place] appraised of BP results from BP evaluations.      Relevant Medications   metoprolol tartrate (LOPRESSOR) 50 MG tablet   Family history of premature CAD (Chronic)    We will still plan to proceed with CT angiogram/CT FFR as scheduled.  This is been delayed due to COVID-19 related restrictions.  We will see her back when this is complete.      Hyperlipidemia LDL goal <100 (Chronic)    Lipids looked on atorvastatin 80 mg.  LDL 75 as of January. Was converted from rosuvastatin 40 mg to atorvastatin 80 mg.  Follow closely.  Depending on results of coronary CT angiogram, may want to be more aggressive.      Relevant Medications   metoprolol tartrate (LOPRESSOR) 50 MG tablet   PFO (patent foramen ovale)   Relevant Medications   metoprolol tartrate (LOPRESSOR) 50 MG tablet   Precordial pain (Chronic)    She really has not had that much more of this precordial type pain.  Was able to mow the lawn without difficulty.  Somewhat reassuring.  Still having a few twinges off and on.  Continue with plan to evaluate with coronary calcium score and coronary CTA once COVID-19 restrictions are lifted.            COVID-19 Education: The signs and  symptoms of COVID-19 were discussed with the patient and how to seek care for testing (follow up with PCP or arrange E-visit).  The importance of social distancing was discussed today.  Patient Risk:   After full review of this patient's clinical status, I feel that they are at least moderate risk at this time.  Time:   Today, I have spent 17 minutes with the patient with telehealth technology discussing mostly her  blood pressure as well as cardiovascular symptoms.  We also discussed social distancing.  She assures me that she has stayed home since the onset of the stay at home order.  Is also avoiding outside contacts..     Medication Adjustments/Labs and Tests Ordered: Current medicines are reviewed at length with the patient today.  Concerns regarding medicines are outlined above.  Tests Ordered: No orders of the defined types were placed in this encounter.  Medication Changes: Meds ordered this encounter  Medications  . metoprolol tartrate (LOPRESSOR) 50 MG tablet    Sig: Take 1 tablet (50 mg total) by mouth 2 (two) times daily.    Dispense:  180 tablet    Refill:  3    Disposition:  Follow up in 3 month(s) ; after completion of Coronary CTA  Signed, Glenetta Hew, MD  07/19/2018 11:15 AM    [place]

## 2018-07-19 ENCOUNTER — Encounter: Payer: Self-pay | Admitting: Cardiology

## 2018-07-19 ENCOUNTER — Telehealth (INDEPENDENT_AMBULATORY_CARE_PROVIDER_SITE_OTHER): Payer: Medicare Other | Admitting: Cardiology

## 2018-07-19 ENCOUNTER — Encounter: Payer: Self-pay | Admitting: Nurse Practitioner

## 2018-07-19 ENCOUNTER — Other Ambulatory Visit: Payer: Self-pay

## 2018-07-19 VITALS — BP 158/89 | HR 76 | Ht 63.0 in | Wt 200.0 lb

## 2018-07-19 DIAGNOSIS — Z8249 Family history of ischemic heart disease and other diseases of the circulatory system: Secondary | ICD-10-CM

## 2018-07-19 DIAGNOSIS — I10 Essential (primary) hypertension: Secondary | ICD-10-CM

## 2018-07-19 DIAGNOSIS — R072 Precordial pain: Secondary | ICD-10-CM

## 2018-07-19 DIAGNOSIS — Q2112 Patent foramen ovale: Secondary | ICD-10-CM

## 2018-07-19 DIAGNOSIS — E785 Hyperlipidemia, unspecified: Secondary | ICD-10-CM

## 2018-07-19 DIAGNOSIS — Q211 Atrial septal defect: Secondary | ICD-10-CM

## 2018-07-19 MED ORDER — METOPROLOL TARTRATE 50 MG PO TABS: 50.0000 mg | ORAL_TABLET | Freq: Two times a day (BID) | ORAL | 3 refills | Status: DC

## 2018-07-19 NOTE — Assessment & Plan Note (Signed)
Lipids looked on atorvastatin 80 mg.  LDL 75 as of January. Was converted from rosuvastatin 40 mg to atorvastatin 80 mg.  Follow closely.  Depending on results of coronary CT angiogram, may want to be more aggressive.

## 2018-07-19 NOTE — Assessment & Plan Note (Signed)
We will still plan to proceed with CT angiogram/CT FFR as scheduled.  This is been delayed due to COVID-19 related restrictions.  We will see her back when this is complete.

## 2018-07-19 NOTE — Patient Instructions (Addendum)
Medication Instructions:   BP medications: Take Losartan at bedtime (has been taking 50 mg Metoprolol in PM & 25 in AM) -- change to METOPROLOL TARTRATE 50 mg TWICE A DAY. Keep [place] appraised of BP results from BP evaluations.  If you need a refill on your cardiac medications before your next appointment, please call your pharmacy.   Lab work:  NOT NEEDED  If you have labs (blood work) drawn today and your tests are completely normal, you will receive your results only by: Marland Kitchen MyChart Message (if you have MyChart) OR . A paper copy in the mail If you have any lab test that is abnormal or we need to change your treatment, we will call you to review the results.  Testing/Procedures:  IF  THE OFFICE HAS NOT CONTACTED YOU ABOUT [place]  CORONARY CTA  BY JUNE 2020. PLEASE CONTACT OFFICE .  Follow-Up: At [facility], you and your health needs are our priority.  As part of our continuing mission to provide you with exceptional heart care, we have created designated Provider Care Teams.  These Care Teams include your primary Cardiologist (physician) and Advanced Practice Providers (APPs -  Physician Assistants and Nurse Practitioners) who all work together to provide you with the care you need, when you need it. You will need a follow up appointment in 3 months after CCTA IS COMPLETE.  Please call our office 2 months in advance to schedule this appointment.  You may see Glenetta Hew, MD or one of the following Advanced Practice Providers on your designated Care Team:   Rosaria Ferries, PA-C . Jory Sims, DNP, ANP  Any Other Special Instructions Will Be Listed Below (If Applicable).  Keep [place] appraised of BP results from BP evaluations.

## 2018-07-19 NOTE — Assessment & Plan Note (Signed)
BP medications: Take Losartan at bedtime (has been taking 50 mg Metoprolol in PM & 25 in AM) -- change to 50 mg BID.  Keep [place] appraised of BP results from BP evaluations.

## 2018-07-19 NOTE — Assessment & Plan Note (Signed)
She really has not had that much more of this precordial type pain.  Was able to mow the lawn without difficulty.  Somewhat reassuring.  Still having a few twinges off and on.  Continue with plan to evaluate with coronary calcium score and coronary CTA once COVID-19 restrictions are lifted.

## 2018-07-19 NOTE — Telephone Encounter (Signed)
Routed to [place], Jessica K, NP  

## 2018-07-22 ENCOUNTER — Ambulatory Visit: Payer: Self-pay

## 2018-07-22 ENCOUNTER — Other Ambulatory Visit: Payer: Medicare Other

## 2018-07-27 ENCOUNTER — Ambulatory Visit: Payer: Medicare Other | Admitting: Internal Medicine

## 2018-07-28 ENCOUNTER — Ambulatory Visit (INDEPENDENT_AMBULATORY_CARE_PROVIDER_SITE_OTHER): Payer: Medicare Other | Admitting: Nurse Practitioner

## 2018-07-28 ENCOUNTER — Encounter: Payer: Self-pay | Admitting: Nurse Practitioner

## 2018-07-28 DIAGNOSIS — J302 Other seasonal allergic rhinitis: Secondary | ICD-10-CM

## 2018-07-28 DIAGNOSIS — J449 Chronic obstructive pulmonary disease, unspecified: Secondary | ICD-10-CM | POA: Diagnosis not present

## 2018-07-28 DIAGNOSIS — I10 Essential (primary) hypertension: Secondary | ICD-10-CM | POA: Diagnosis not present

## 2018-07-28 MED ORDER — METOPROLOL TARTRATE 50 MG PO TABS: 75.0000 mg | ORAL_TABLET | Freq: Two times a day (BID) | ORAL | 3 refills | Status: DC

## 2018-07-28 NOTE — Progress Notes (Signed)
This service is provided via telemedicine  No vital signs collected/recorded due to the encounter was a telemedicine visit.   Location of patient (ex: home, work):  Home  Patient consents to a telephone visit:  Yes  Location of the provider (ex: office, home):  [employer], Office   Names of all persons participating in the telemedicine service and their role in the encounter:  S.Chrae B/CMA, Sherrie Mustache, NP, and Patient   Time spent on call: 5 min with medical assistant   Virtual Visit via Telephone Note  I connected with Grace [name] on 07/28/18 at 10:00 AM EDT by telephone and verified that I am speaking with the correct person using two identifiers.   I discussed the limitations, risks, security and privacy concerns of performing an evaluation and management service by telephone and the availability of in person appointments. I also discussed with the patient that there may be a patient responsible charge related to this service. The patient expressed understanding and agreed to proceed.      Careteam: Patient Care Team: [name], NP as PCP - General (Geriatric Medicine) Leonie Man, MD as PCP - Cardiology (Cardiology)  Advanced Directive information    Allergies  Allergen Reactions  . Amlodipine     Cramps   . Anoro Ellipta [Umeclidinium-Vilanterol] Other (See Comments)    Chest pain if used more than 3 consecutive days   . Codeine     REACTION: headaches  . Gabapentin     headache  . Prozac [Fluoxetine Hcl] Other (See Comments)    Suicidal ideations    Chief Complaint  Patient presents with  . Follow-up    2 week follow-up on blood pressure. Tele-visit      HPI: Patient is a 63 y.o. female to follow up on blood pressure.  Had increase cramps in the legs when she started on norvasc, stopped norvasc and cramps improved.   Blood pressure remains elevated 156/88, 168/94 154/82, 162/89 on metoprolol 50 mg by mouth twice  daily and taking losartan 100 mg by mouth daily  This morning 142/90 HR 87 this morning mostly running in the 70s.  Low sodium diet.   No side effects from increase metoprolol.  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 Does not have follow up until July.    Review of Systems:  Review of Systems  Constitutional: Negative for chills, fever and malaise/fatigue.  HENT: Positive for congestion (with clear runny nose).   Eyes:       Itchy eye  Respiratory: Positive for cough (stable "copd cough"), shortness of breath and wheezing. Negative for sputum production.   Cardiovascular: Positive for chest pain (rare, stable). Negative for leg swelling.  Neurological: Positive for headaches (stable, no new).    Past Medical History:  Diagnosis Date  . Abnormal vaginal bleeding   . Asthma   . Barrett's esophagus    with high grade dysplasia per endoscopy 05/2005 // followed by Dr. [name] ([facility])  . Chronic back pain   . COPD (chronic obstructive pulmonary disease) ([place])   . Diabetes mellitus without complication ([place])   . Duodenal ulcer disease    thought to be contributed at least partly by overuse of NSAIDs and salicylates (goody powdr)  . Endometrial polyp 03/2003   s/p resection in  03/2003. Path showing submucosal leiomyoma and benign  proliferative type endometrium  . Fibroid uterus    s/p myomectomy x 2  . GERD (gastroesophageal reflux disease)   . H/O failed moderate sedation   . Hiatal hernia   . History of CVA (cerebrovascular accident) 10/2000  . 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 Hyperlipidemia   . Hypertension   . Hypothyroidism    with hx of multinodular goiter (noted on neck US in 11/2002 - showing diffuse nodularity and  inhomogenous texture diffusely BL)  . Internal hemorrhoids    grade 2 per colonoscopy in 09/2006 - repeat colonoscopy rec in 5-10 years.  . Migraine   . Nevus   . Personal history of failed conscious sedation 08/11/2011  . Tachycardia   . Thyroid disease   . Tobacco abuse Jan 2014   quit   Past Surgical History:  Procedure Laterality Date  . CHOLECYSTECTOMY    . COLONOSCOPY  02/07/2010  . HYSTEROSCOPY    . LAPAROSCOPIC APPENDECTOMY N/A 09/23/2015   Procedure: APPENDECTOMY LAPAROSCOPIC;  Surgeon: Georganna Skeans, MD;  Location: [facility];  Service: General;  Laterality: N/A;  . [place]   x 2 - In 1997, noted to have extensive pelvic adhesions and BL tubal obstruction  . TRANSTHORACIC ECHOCARDIOGRAM  06/2014    EF 55-60%.  No RWMA. Gr 1 DD. Cannot R/o PFO (consider bubble study).  [facility] UPPER GASTROINTESTINAL ENDOSCOPY  12/18/2009   Social History:   reports that she quit smoking about 3 years ago. Her smoking use included cigarettes. She has a 150.00 pack-year smoking history. She has never used smokeless tobacco. She reports that she does not drink alcohol or use drugs.  Family History  Problem Relation Age of Onset  . COPD Mother   . Heart disease Father 27       had 5 heart attacks, first in late 15s  . COPD Father     Medications: Patient's Medications  New Prescriptions   No medications on file  Previous Medications   ALBUTEROL (PROAIR HFA) 108 (90 BASE) MCG/ACT INHALER    INHALE 2 PUFFS EVERY 6  HOURS AS NEEDED FOR  SHORTNESS OF BREATH OR  WHEEZING   ALUM & MAG HYDROXIDE-SIMETH (MAALOX/MYLANTA) 200-200-20 MG/5ML SUSPENSION    Take by mouth every 6 (six) hours as needed for indigestion or heartburn.   ASPIRIN EC 81 MG TABLET    Take 81 mg by mouth daily.   ASPIRIN-ACETAMINOPHEN-CAFFEINE 175-102-58 MG PACK    Take 1 Package by mouth as needed.    ATORVASTATIN (LIPITOR) 80 MG TABLET    TAKE ONE TABLET BY MOUTH  ONCE DAILY AT BEDTIME FOR  CHOLESTEROL   BIOTIN 5000 MCG  TABS    Take by mouth daily.   BUDESONIDE-FORMOTEROL (SYMBICORT) 160-4.5 MCG/ACT INHALER    2 puffs twice daily.   GLIPIZIDE (GLUCOTROL XL) 5 MG 24 HR TABLET    Take 1 tablet (5 mg total) by mouth daily with breakfast.   IBUPROFEN (ADVIL,MOTRIN) 200 MG TABLET    Take 800 mg by mouth as needed for moderate pain.    IPRATROPIUM-ALBUTEROL (DUONEB) 0.5-2.5 (3) MG/3ML SOLN    Take 3 mLs by nebulization every 6 (six) hours as needed. For shortness of breath or wheezing   LEVOTHYROXINE (SYNTHROID, LEVOTHROID) 150 MCG TABLET    Take 1 tablet (150  mcg total) by mouth daily before breakfast.   LORAZEPAM (ATIVAN) 0.5 MG TABLET    TAKE 1 TABLET BY MOUTH 3  TIMES A DAY AS NEEDED FOR  ANXIETY OR NERVOUSNESS   LOSARTAN (COZAAR) 100 MG TABLET    TAKE 1 TABLET BY MOUTH  DAILY   METOPROLOL TARTRATE (LOPRESSOR) 50 MG TABLET    Take 1 tablet (50 mg total) by mouth 2 (two) times daily.   MULTIPLE VITAMINS-MINERALS (HAIR/SKIN/NAILS) TABS    Take by mouth daily.   OMEPRAZOLE (PRILOSEC) 20 MG CAPSULE    Take 1 capsule (20 mg total) by mouth daily.   ONETOUCH DELICA LANCETS 76B MISC    USE TWO TIMES DAILY   ONETOUCH VERIO TEST STRIP    CHECK BLOOD SUGAR TWO TIMES DAILY AND AS DIRECTED   TIOTROPIUM BROMIDE MONOHYDRATE (SPIRIVA RESPIMAT) 2.5 MCG/ACT AERS    Inhale 2 puffs into the lungs daily for 1 day.  Modified Medications   No medications on file  Discontinued Medications   BIOTIN (BIOTIN MAXIMUM) 10000 MCG TBDP    Take by mouth daily.     Physical Exam: unable due to tele-visit    Labs reviewed: Basic Metabolic Panel: Recent Labs    07/28/17 1112 10/30/17 1241 04/21/18 0918 06/17/18 0825  NA 141 140 142  --   K 4.2 4.5 3.8  --   CL 106 106 106  --   CO2 29 26 27   --   GLUCOSE 109 61* 76  --   BUN 12 16 14   --   CREATININE 0.81 0.85 0.70  --   CALCIUM 9.3 9.3 9.0  --   TSH 0.33* 0.48 0.10* 1.51   Liver Function Tests: Recent Labs    07/28/17 1112 10/30/17 1241 04/21/18 0918  AST  --  19 17   ALT 13 15 16   BILITOT  --  0.3 0.3  PROT  --  6.9 6.7   No results for input(s): LIPASE, AMYLASE in the last 8760 hours. No results for input(s): AMMONIA in the last 8760 hours. CBC: Recent Labs    04/22/18 0953  WBC 7.5  NEUTROABS 4,463  HGB 13.3  HCT 39.8  MCV 84.5  PLT 280   Lipid Panel: Recent Labs    07/28/17 1112 10/30/17 1241 04/21/18 0918  CHOL 111 122 148  HDL 45* 48* 52  LDLCALC 48 56 75  TRIG 95 98 124  CHOLHDL 2.5 2.5 2.8   TSH: Recent Labs    10/30/17 1241 04/21/18 0918 06/17/18 0825  TSH 0.48 0.10* 1.51   A1C: Lab Results  Component Value Date   HGBA1C 6.1 (H) 04/21/2018     Assessment/Plan 1. Essential hypertension -not controlled, will increase lopressor to 75 mg BID, continue on losartan 100 mg by mouth daily.  -continue dietary modifications.  - metoprolol tartrate (LOPRESSOR) 50 MG tablet; Take 1.5 tablets (75 mg total) by mouth 2 (two) times daily.  Dispense: 180 tablet; Refill: 3  2. Seasonal allergies Encouraged to use OTC zyrtec or claritin 10 mg by mouth daily  3. COPD with asthma ([place]) -worsening shortness of breath and wheezing. Using albuterol inhaler frequently. Reports she feels like Symbicort and Spiriva is not controlling her symptoms. She plans to call pulmonary and discuss with them.   Next appt: 2 weeks for blood pressure follow up [place]. Harle Battiest  [facility] [phone]    Follow Up Instructions:    I discussed the assessment and  treatment plan with the patient. The patient was provided an opportunity to ask questions and all were answered. The patient agreed with the plan and demonstrated an understanding of the instructions.   The patient was advised to call back or seek an in-person evaluation if the symptoms worsen or if the condition fails to improve as anticipated.  I provided 14 minutes of non-face-to-face time during this encounter.  avs printed and mailed [name], NP

## 2018-07-28 NOTE — Patient Instructions (Signed)
Increase metoprolol to 75 mg twice daily Continue losartan 100 mg daily  To start Claritin OR zyrtec 10 mg by mouth daily as needed for allergies.    DASH Eating Plan DASH stands for "Dietary Approaches to Stop Hypertension." The DASH eating plan is a healthy eating plan that has been shown to reduce high blood pressure (hypertension). It may also reduce your risk for type 2 diabetes, heart disease, and stroke. The DASH eating plan may also help with weight loss. What are tips for following this plan?  General guidelines  Avoid eating more than 2,300 mg (milligrams) of salt (sodium) a day. If you have hypertension, you may need to reduce your sodium intake to 1,500 mg a day.  Limit alcohol intake to no more than 1 drink a day for nonpregnant women and 2 drinks a day for men. One drink equals 12 oz of beer, 5 oz of wine, or 1 oz of hard liquor.  Work with your health care provider to maintain a healthy body weight or to lose weight. Ask what an ideal weight is for you.  Get at least 30 minutes of exercise that causes your heart to beat faster (aerobic exercise) most days of the week. Activities may include walking, swimming, or biking.  Work with your health care provider or diet and nutrition specialist (dietitian) to adjust your eating plan to your individual calorie needs. Reading food labels   Check food labels for the amount of sodium per serving. Choose foods with less than 5 percent of the Daily Value of sodium. Generally, foods with less than 300 mg of sodium per serving fit into this eating plan.  To find whole grains, look for the word "whole" as the first word in the ingredient list. Shopping  Buy products labeled as "low-sodium" or "no salt added."  Buy fresh foods. Avoid canned foods and premade or frozen meals. Cooking  Avoid adding salt when cooking. Use salt-free seasonings or herbs instead of table salt or sea salt. Check with your health care provider or pharmacist  before using salt substitutes.  Do not fry foods. Cook foods using healthy methods such as baking, boiling, grilling, and broiling instead.  Cook with heart-healthy oils, such as olive, canola, soybean, or sunflower oil. Meal planning  Eat a balanced diet that includes: ? 5 or more servings of fruits and vegetables each day. At each meal, try to fill half of your plate with fruits and vegetables. ? Up to 6-8 servings of whole grains each day. ? Less than 6 oz of lean meat, poultry, or fish each day. A 3-oz serving of meat is about the same size as a deck of cards. One egg equals 1 oz. ? 2 servings of low-fat dairy each day. ? A serving of nuts, seeds, or beans 5 times each week. ? Heart-healthy fats. Healthy fats called Omega-3 fatty acids are found in foods such as flaxseeds and coldwater fish, like sardines, salmon, and mackerel.  Limit how much you eat of the following: ? Canned or prepackaged foods. ? Food that is high in trans fat, such as fried foods. ? Food that is high in saturated fat, such as fatty meat. ? Sweets, desserts, sugary drinks, and other foods with added sugar. ? Full-fat dairy products.  Do not salt foods before eating.  Try to eat at least 2 vegetarian meals each week.  Eat more home-cooked food and less restaurant, buffet, and fast food.  When eating at a restaurant, ask that  your food be prepared with less salt or no salt, if possible. What foods are recommended? The items listed may not be a complete list. Talk with your dietitian about what dietary choices are best for you. Grains Whole-grain or whole-wheat bread. Whole-grain or whole-wheat pasta. Brown rice. [place]. Bulgur. Whole-grain and low-sodium cereals. Pita bread. Low-fat, low-sodium crackers. Whole-wheat flour tortillas. Vegetables Fresh or frozen vegetables (raw, steamed, roasted, or grilled). Low-sodium or reduced-sodium tomato and vegetable juice. Low-sodium or reduced-sodium tomato  sauce and tomato paste. Low-sodium or reduced-sodium canned vegetables. Fruits All fresh, dried, or frozen fruit. Canned fruit in natural juice (without added sugar). Meat and other protein foods Skinless chicken or [place]. Ground chicken or [place]. Pork with fat trimmed off. Fish and seafood. Egg whites. Dried beans, peas, or lentils. Unsalted nuts, nut butters, and seeds. Unsalted canned beans. Lean cuts of beef with fat trimmed off. Low-sodium, lean deli meat. Dairy Low-fat (1%) or fat-free (skim) milk. Fat-free, low-fat, or reduced-fat cheeses. Nonfat, low-sodium ricotta or cottage cheese. Low-fat or nonfat yogurt. Low-fat, low-sodium cheese. Fats and oils Soft margarine without trans fats. Vegetable oil. Low-fat, reduced-fat, or light mayonnaise and salad dressings (reduced-sodium). Canola, safflower, olive, soybean, and sunflower oils. Avocado. Seasoning and other foods Herbs. Spices. Seasoning mixes without salt. Unsalted popcorn and pretzels. Fat-free sweets. What foods are not recommended? The items listed may not be a complete list. Talk with your dietitian about what dietary choices are best for you. Grains Baked goods made with fat, such as croissants, muffins, or some breads. Dry pasta or rice meal packs. Vegetables Creamed or fried vegetables. Vegetables in a cheese sauce. Regular canned vegetables (not low-sodium or reduced-sodium). Regular canned tomato sauce and paste (not low-sodium or reduced-sodium). Regular tomato and vegetable juice (not low-sodium or reduced-sodium). Angie Fava. Olives. Fruits Canned fruit in a light or heavy syrup. Fried fruit. Fruit in cream or butter sauce. Meat and other protein foods Fatty cuts of meat. Ribs. Fried meat. Berniece Salines. Sausage. Bologna and other processed lunch meats. Salami. Fatback. Hotdogs. Bratwurst. Salted nuts and seeds. Canned beans with added salt. Canned or smoked fish. Whole eggs or egg yolks. Chicken or [place] with skin. Dairy Whole  or 2% milk, cream, and half-and-half. Whole or full-fat cream cheese. Whole-fat or sweetened yogurt. Full-fat cheese. Nondairy creamers. Whipped toppings. Processed cheese and cheese spreads. Fats and oils Butter. Stick margarine. Lard. Shortening. Ghee. Bacon fat. Tropical oils, such as coconut, palm kernel, or palm oil. Seasoning and other foods Salted popcorn and pretzels. Onion salt, garlic salt, seasoned salt, table salt, and sea salt. Worcestershire sauce. Tartar sauce. Barbecue sauce. Teriyaki sauce. Soy sauce, including reduced-sodium. Steak sauce. Canned and packaged gravies. Fish sauce. Oyster sauce. Cocktail sauce. Horseradish that you find on the shelf. Ketchup. Mustard. Meat flavorings and tenderizers. Bouillon cubes. Hot sauce and Tabasco sauce. Premade or packaged marinades. Premade or packaged taco seasonings. Relishes. Regular salad dressings. Where to find more information:  National Heart, Lung, and [place]: https://wilson-eaton.com/  American Heart Association: www.heart.org Summary  The DASH eating plan is a healthy eating plan that has been shown to reduce high blood pressure (hypertension). It may also reduce your risk for type 2 diabetes, heart disease, and stroke.  With the DASH eating plan, you should limit salt (sodium) intake to 2,300 mg a day. If you have hypertension, you may need to reduce your sodium intake to 1,500 mg a day.  When on the DASH eating plan, aim to eat more fresh fruits and  vegetables, whole grains, lean proteins, low-fat dairy, and heart-healthy fats.  Work with your health care provider or diet and nutrition specialist (dietitian) to adjust your eating plan to your individual calorie needs. This information is not intended to replace advice given to you by your health care provider. Make sure you discuss any questions you have with your health care provider. Document Released: 03/20/2011 Document Revised: 03/24/2016 Document Reviewed: 03/24/2016  Elsevier Interactive Patient Education  2019 [employer].

## 2018-07-30 ENCOUNTER — Encounter: Payer: Self-pay | Admitting: Nurse Practitioner

## 2018-07-30 ENCOUNTER — Other Ambulatory Visit: Payer: Self-pay

## 2018-07-30 ENCOUNTER — Other Ambulatory Visit: Payer: Self-pay | Admitting: Nurse Practitioner

## 2018-07-30 DIAGNOSIS — I10 Essential (primary) hypertension: Secondary | ICD-10-CM

## 2018-07-30 NOTE — Telephone Encounter (Signed)
Last filled 04/22/2018  [place] Database verified and compliance confirmed

## 2018-08-02 MED ORDER — METOPROLOL TARTRATE 100 MG PO TABS: 100.0000 mg | ORAL_TABLET | Freq: Two times a day (BID) | ORAL | 1 refills | Status: DC

## 2018-08-10 ENCOUNTER — Encounter: Payer: Self-pay | Admitting: Nurse Practitioner

## 2018-08-11 ENCOUNTER — Encounter: Payer: Self-pay | Admitting: Nurse Practitioner

## 2018-08-11 ENCOUNTER — Ambulatory Visit (INDEPENDENT_AMBULATORY_CARE_PROVIDER_SITE_OTHER): Payer: Medicare Other | Admitting: Nurse Practitioner

## 2018-08-11 DIAGNOSIS — I10 Essential (primary) hypertension: Secondary | ICD-10-CM

## 2018-08-11 MED ORDER — HYDRALAZINE HCL 25 MG PO TABS: 25.0000 mg | ORAL_TABLET | Freq: Three times a day (TID) | ORAL | 1 refills | Status: DC

## 2018-08-11 NOTE — Progress Notes (Signed)
This service is provided via video-telemedicine  No vital signs collected/recorded due to the encounter was a telemedicine visit.   Location of patient (ex: home, work):  Home  Patient consents to a telephone visit:  Yes  Location of the provider (ex: office, home):  [employer], office  Names of all persons participating in the video-telemedicine service and their role in the encounter:  Leafy Half CMA, [name], and patient.   Time spent on call:  5 mins  Virtual Visit via Telephone Note  I connected with Grace [name] on 08/11/18 at  8:30 AM EDT by video and verified that I am speaking with the correct person using two identifiers.   I discussed the limitations, risks, security and privacy concerns of performing an evaluation and management service by telephone and the availability of in person appointments. I also discussed with the patient that there may be a patient responsible charge related to this service. The patient expressed understanding and agreed to proceed.    Careteam: Patient Care Team: [name], NP as PCP - General (Geriatric Medicine) Leonie Man, MD as PCP - Cardiology (Cardiology)  Advanced Directive information    Allergies  Allergen Reactions  . Amlodipine     Cramps   . Anoro Ellipta [Umeclidinium-Vilanterol] Other (See Comments)    Chest pain if used more than 3 consecutive days   . Codeine     REACTION: headaches  . Gabapentin     headache  . Prozac [Fluoxetine Hcl] Other (See Comments)    Suicidal ideations    Chief Complaint  Patient presents with  . Medical Management of Chronic Issues    2 week blood pressure follow up. She has been taking only 75 mg of metoprolol since going back on amolodipine.      HPI: Patient is a 63 y.o. female for blood pressure follow up.  153/89,156/95,176/107 on metoprolol 100 mg by mouth twice daily so she restarted norvasc 5 mg daily due to blood pressure being  high Now blood pressure 126/73, 141/78, 142/80 but on avg sbp in the 120s However muscle cramps are back.  Continues on losartan 100 mg by mouth daily  HR 60s-80s Continues to have some chest pains (which have been ongoing and followed by cardiologist) but nothing atypical   Review of Systems:  Review of Systems  Constitutional: Negative for chills, fever and malaise/fatigue.  HENT: Negative for congestion.   Eyes:       Itchy eye  Respiratory: Negative for cough, sputum production, shortness of breath and wheezing.   Cardiovascular: Positive for chest pain (rare, stable). Negative for leg swelling.  Neurological: Positive for headaches (stable, no new). Negative for dizziness.    Past Medical History:  Diagnosis Date  . Abnormal vaginal bleeding   . Asthma   . Barrett's esophagus    with high grade dysplasia per endoscopy 05/2005 // followed by Dr. [name] ([facility])  . Chronic back pain   . COPD (chronic obstructive pulmonary disease) ([place])   . Diabetes mellitus without complication ([place])   . Duodenal ulcer disease    thought to be contributed at least partly by overuse of NSAIDs and salicylates (goody powdr)  . Endometrial polyp 03/2003   s/p resection in 03/2003. Path showing submucosal leiomyoma and benign  proliferative type endometrium  . Fibroid uterus    s/p myomectomy x 2  . GERD (gastroesophageal reflux disease)   . H/O failed moderate sedation   .  Hiatal hernia   . History of CVA (cerebrovascular accident) 10/2000  . 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 Hyperlipidemia   . Hypertension   . Hypothyroidism    with hx of multinodular goiter (noted on neck US in 11/2002 - showing diffuse nodularity and inhomogenous texture diffusely BL)  . Internal hemorrhoids    grade 2 per colonoscopy in 09/2006 - repeat colonoscopy rec in 5-10 years.  . Migraine   . Nevus   .  Personal history of failed conscious sedation 08/11/2011  . Tachycardia   . Thyroid disease   . Tobacco abuse Jan 2014   quit   Past Surgical History:  Procedure Laterality Date  . CHOLECYSTECTOMY    . COLONOSCOPY  02/07/2010  . HYSTEROSCOPY    . LAPAROSCOPIC APPENDECTOMY N/A 09/23/2015   Procedure: APPENDECTOMY LAPAROSCOPIC;  Surgeon: Georganna Skeans, MD;  Location: [facility];  Service: General;  Laterality: N/A;  . [place]   x 2 - In 1997, noted to have extensive pelvic adhesions and BL tubal obstruction  . TRANSTHORACIC ECHOCARDIOGRAM  06/2014    EF 55-60%.  No RWMA. Gr 1 DD. Cannot R/o PFO (consider bubble study).  Marland Kitchen UPPER GASTROINTESTINAL ENDOSCOPY  12/18/2009   Social History:   reports that she quit smoking about 3 years ago. Her smoking use included cigarettes. She has a 150.00 pack-year smoking history. She has never used smokeless tobacco. She reports that she does not drink alcohol or use drugs.  Family History  Problem Relation Age of Onset  . COPD Mother   . Heart disease Father 66       had 5 heart attacks, first in late 76s  . COPD Father     Medications: Patient's Medications  New Prescriptions   No medications on file  Previous Medications   ALBUTEROL (PROAIR HFA) 108 (90 BASE) MCG/ACT INHALER    INHALE 2 PUFFS EVERY 6  HOURS AS NEEDED FOR  SHORTNESS OF BREATH OR  WHEEZING   ALUM & MAG HYDROXIDE-SIMETH (MAALOX/MYLANTA) 200-200-20 MG/5ML SUSPENSION    Take by mouth every 6 (six) hours as needed for indigestion or heartburn.   AMLODIPINE (NORVASC) 5 MG TABLET    Take 5 mg by mouth daily.   ASPIRIN EC 81 MG TABLET    Take 81 mg by mouth daily.   ASPIRIN-ACETAMINOPHEN-CAFFEINE 102-585-27 MG PACK    Take 1 Package by mouth as needed.    ATORVASTATIN (LIPITOR) 80 MG TABLET    TAKE ONE TABLET BY MOUTH  ONCE DAILY AT BEDTIME FOR  CHOLESTEROL   BIOTIN 5000 MCG TABS    Take by mouth daily.   BUDESONIDE-FORMOTEROL (SYMBICORT) 160-4.5 MCG/ACT INHALER    2 puffs  twice daily.   GLIPIZIDE (GLUCOTROL XL) 5 MG 24 HR TABLET    Take 1 tablet (5 mg total) by mouth daily with breakfast.   IBUPROFEN (ADVIL,MOTRIN) 200 MG TABLET    Take 800 mg by mouth as needed for moderate pain.    IPRATROPIUM-ALBUTEROL (DUONEB) 0.5-2.5 (3) MG/3ML SOLN    Take 3 mLs by nebulization every 6 (six) hours as needed. For shortness of breath or wheezing   LEVOTHYROXINE (SYNTHROID, LEVOTHROID) 150 MCG TABLET    Take 1 tablet (150 mcg total) by mouth daily before breakfast.   LORAZEPAM (ATIVAN) 0.5 MG TABLET    TAKE 1 TABLET BY MOUTH  3  TIMES A DAY AS NEEDED FOR  ANXIETY OR NERVOUSNESS   LOSARTAN (COZAAR) 100 MG TABLET    TAKE 1 TABLET BY MOUTH  DAILY   METOPROLOL TARTRATE (LOPRESSOR) 100 MG TABLET    Take 1 tablet (100 mg total) by mouth 2 (two) times daily.   MULTIPLE VITAMINS-MINERALS (HAIR/SKIN/NAILS) TABS    Take by mouth daily.   OMEPRAZOLE (PRILOSEC) 20 MG CAPSULE    Take 1 capsule (20 mg total) by mouth daily.   ONETOUCH DELICA LANCETS 45O MISC    USE TWO TIMES DAILY   ONETOUCH VERIO TEST STRIP    CHECK BLOOD SUGAR TWO TIMES DAILY AND AS DIRECTED   TIOTROPIUM BROMIDE MONOHYDRATE (SPIRIVA RESPIMAT) 2.5 MCG/ACT AERS    Inhale 2 puffs into the lungs daily for 1 day.  Modified Medications   No medications on file  Discontinued Medications   No medications on file      Labs reviewed: Basic Metabolic Panel: Recent Labs    10/30/17 1241 04/21/18 0918 06/17/18 0825  NA 140 142  --   K 4.5 3.8  --   CL 106 106  --   CO2 26 27  --   GLUCOSE 61* 76  --   BUN 16 14  --   CREATININE 0.85 0.70  --   CALCIUM 9.3 9.0  --   TSH 0.48 0.10* 1.51   Liver Function Tests: Recent Labs    10/30/17 1241 04/21/18 0918  AST 19 17  ALT 15 16  BILITOT 0.3 0.3  PROT 6.9 6.7   No results for input(s): LIPASE, AMYLASE in the last 8760 hours. No results for input(s): AMMONIA in the last 8760 hours. CBC: Recent Labs    04/22/18 0953  WBC 7.5  NEUTROABS 4,463  HGB 13.3  HCT  39.8  MCV 84.5  PLT 280   Lipid Panel: Recent Labs    10/30/17 1241 04/21/18 0918  CHOL 122 148  HDL 48* 52  LDLCALC 56 75  TRIG 98 124  CHOLHDL 2.5 2.8   TSH: Recent Labs    10/30/17 1241 04/21/18 0918 06/17/18 0825  TSH 0.48 0.10* 1.51   A1C: Lab Results  Component Value Date   HGBA1C 6.1 (H) 04/21/2018     Assessment/Plan 1. Essential hypertension metroprolol 100 mg PO twice daily did not control pressure and therefore norvasc was restarted and controlled on norvasc but can not tolerate due to side effects.  To continue to losartan 100 mg daily and metoprolol 75 mg by mouth twice daily will add hydralazine 25 mg by mouth TID. To continue low sodium diet.  - hydrALAZINE (APRESOLINE) 25 MG tablet; Take 1 tablet (25 mg total) by mouth 3 (three) times daily.  Dispense: 270 tablet; Refill: 1  Next appt: 08/25/2018 for blood pressure follow up- agreeable for video visit.  Carlos American. Harle Battiest  [facility] [phone]    Follow Up Instructions:    I discussed the assessment and treatment plan with the patient. The patient was provided an opportunity to ask questions and all were answered. The patient agreed with the plan and demonstrated an understanding of the instructions.   The patient was advised to call back or seek an in-person evaluation if the symptoms worsen or if the condition fails to improve as anticipated.  I provided 15 minutes of non-face-to-face time during this encounter.  avs printed and mailed.  Sherrie Mustache, NP

## 2018-08-11 NOTE — Patient Instructions (Signed)
Continue metoprolol 75 mg by mouth daily  Once you receive the hydralazine 25 mg to STOP norvasc Start hydralazine 25 mg by mouth three times daily (6-8 hours apart)

## 2018-08-23 ENCOUNTER — Other Ambulatory Visit: Payer: Self-pay

## 2018-08-23 MED ORDER — TIOTROPIUM BROMIDE MONOHYDRATE 2.5 MCG/ACT IN AERS: 2.0000 | INHALATION_SPRAY | Freq: Every day | RESPIRATORY_TRACT | 0 refills | Status: DC

## 2018-08-23 MED ORDER — TIOTROPIUM BROMIDE MONOHYDRATE 2.5 MCG/ACT IN AERS: 2.0000 | INHALATION_SPRAY | Freq: Every day | RESPIRATORY_TRACT | 1 refills | Status: DC

## 2018-08-24 ENCOUNTER — Encounter: Payer: Self-pay | Admitting: Nurse Practitioner

## 2018-08-25 ENCOUNTER — Other Ambulatory Visit: Payer: Self-pay | Admitting: *Deleted

## 2018-08-25 ENCOUNTER — Encounter: Payer: Self-pay | Admitting: Nurse Practitioner

## 2018-08-25 ENCOUNTER — Ambulatory Visit (INDEPENDENT_AMBULATORY_CARE_PROVIDER_SITE_OTHER): Payer: Medicare Other | Admitting: Nurse Practitioner

## 2018-08-25 ENCOUNTER — Other Ambulatory Visit: Payer: Self-pay

## 2018-08-25 DIAGNOSIS — E119 Type 2 diabetes mellitus without complications: Secondary | ICD-10-CM

## 2018-08-25 DIAGNOSIS — J449 Chronic obstructive pulmonary disease, unspecified: Secondary | ICD-10-CM | POA: Diagnosis not present

## 2018-08-25 DIAGNOSIS — Z7189 Other specified counseling: Secondary | ICD-10-CM

## 2018-08-25 DIAGNOSIS — J4489 Other specified chronic obstructive pulmonary disease: Secondary | ICD-10-CM

## 2018-08-25 DIAGNOSIS — I10 Essential (primary) hypertension: Secondary | ICD-10-CM

## 2018-08-25 DIAGNOSIS — R112 Nausea with vomiting, unspecified: Secondary | ICD-10-CM | POA: Diagnosis not present

## 2018-08-25 MED ORDER — LOSARTAN POTASSIUM 100 MG PO TABS: 100.0000 mg | ORAL_TABLET | Freq: Every day | ORAL | 2 refills | Status: DC

## 2018-08-25 MED ORDER — METOPROLOL TARTRATE 50 MG PO TABS: 100.0000 mg | ORAL_TABLET | Freq: Two times a day (BID) | ORAL | 1 refills | Status: DC

## 2018-08-25 MED ORDER — ONETOUCH DELICA LANCETS 33G MISC: 1 refills | Status: DC

## 2018-08-25 MED ORDER — HYDRALAZINE HCL 50 MG PO TABS: 50.0000 mg | ORAL_TABLET | Freq: Three times a day (TID) | ORAL | 2 refills | Status: DC

## 2018-08-25 MED ORDER — GLUCOSE BLOOD VI STRP: ORAL_STRIP | 1 refills | Status: AC

## 2018-08-25 NOTE — Patient Instructions (Addendum)
Advanced directive paper work mailed, please review and we can discuss any questions at your next follow up.   DECREASE metoprolol 50 mg by mouth twice daily for blood pressure INCREASE hydralazine to 50 mg by mouth three times daily  Continue losartan 100 mg

## 2018-08-25 NOTE — Telephone Encounter (Signed)
[facility] 

## 2018-08-25 NOTE — Progress Notes (Signed)
This service is provided via telemedicine  No vital signs collected/recorded due to the encounter was a telemedicine visit.   Location of patient (ex: home, work):  Home  Patient consents to a telephone visit:  Yes  Location of the provider (ex: office, home):  [facility], Office   Name of any referring provider:  N/A  Names of all persons participating in the telemedicine service and their role in the encounter:  S.Chrae B/CMA, Sherrie Mustache, NP, and Patient   Time spent on call: 10 min with medical assistant   Virtual Visit via Telephone Note  I connected with Grace [name] on 08/25/18 at  8:00 AM EDT by telephone and verified that I am speaking with the correct person using two identifiers.  Location: Patient: home Provider: office    I discussed the limitations, risks, security and privacy concerns of performing an evaluation and management service by telephone and the availability of in person appointments. I also discussed with the patient that there may be a patient responsible charge related to this service. The patient expressed understanding and agreed to proceed.     Careteam: Patient Care Team: [name], NP as PCP - General (Geriatric Medicine) Leonie Man, MD as PCP - Cardiology (Cardiology)  Advanced Directive information Does Patient Have a Medical Advance Directive?: No, Would patient like information on creating a medical advance directive?: Yes (MAU/Ambulatory/Procedural Areas - Information given)(Paperwork given at previous visit. Patient does not have anyone to list on paperwork )  Allergies  Allergen Reactions  . Amlodipine     Cramps   . Anoro Ellipta [Umeclidinium-Vilanterol] Other (See Comments)    Chest pain if used more than 3 consecutive days   . Codeine     REACTION: headaches  . Gabapentin     headache  . Prozac [Fluoxetine Hcl] Other (See Comments)    Suicidal ideations    Chief Complaint  Patient  presents with  . Follow-up    2 week follow-up on B/P. Patient concerned about pulse rate of 95, yesterday. Patient also c/o chest pain. Patient had to get up at 5am yesterday to use nebulizer due to increased SOB. Patient also had a vomiting episode and diarrhea, yesterday.  Telephone Visit   . Advanced Directive    Discuss DNR      HPI: Patient is a 63 y.o. female to follow up on blood pressure Reports her breathing has been "acting up"  Yesterday morning reports she had a bad day. Had to get up and use a nebulizer,  Reports she struggles to breath.  She has been having episodes of shortness of breath for a while at night. No fever.  Yesterday also reports nausea and vomited.  After she threw up a few times nausea improved.  Also noted diarrhea x 3 times yesterday.  Chest hurt yesterday and took ASA a few times.  Blood pressure was good yesterday but HR 95-100 Took a lorazepam and laid down for a few hours. When she woke up symptoms were better.   Thought yesterday she had COVID but has not been out of the house since March. Temperature has been consistently running 96-97.  Questioned if she ate something that did not sit well with her stomach.  Had chicken alfredo with garlic bread. Threw the leftovers away. Keeping fluids now, some nausea this morning. Last vomiting episode was over 24 hours ago. Last diarrhea episode was 10 pm.  Ate stew beef with potatoes for dinner  and did well with that.   Slept well last night, no shortness or breath or chest pain over night.  Breathing has been bad for a while, questions if it got worse with increase in metoprolol and waking up more at night.  135/76 this morning when she woke.  HR 70 Still having off and on little twinges of chest pain, not associated with any particular activity or exertion. Following with cardiology however she is cautious due to COVID and test are expensive.   Advanced directives- Reports she has no family, no distant  relative except for a cousin that she does not talk to. Has a friend that could be a point of contact.  Request DNR.    Review of Systems:  Review of Systems  Constitutional: Negative for chills, fever and malaise/fatigue.  HENT: Negative for congestion.   Eyes:       Itchy eye  Respiratory: Positive for shortness of breath. Negative for cough, sputum production and wheezing.   Cardiovascular: Positive for chest pain (rare, stable). Negative for leg swelling.  Gastrointestinal: Positive for diarrhea, nausea and vomiting. Negative for constipation.  Genitourinary: Negative for dysuria.  Neurological: Positive for headaches (stable, no new). Negative for dizziness.    Past Medical History:  Diagnosis Date  . Abnormal vaginal bleeding   . Asthma   . Barrett's esophagus    with high grade dysplasia per endoscopy 05/2005 // followed by Dr. [name] ([facility])  . Chronic back pain   . COPD (chronic obstructive pulmonary disease) ([place])   . Diabetes mellitus without complication ([place])   . Duodenal ulcer disease    thought to be contributed at least partly by overuse of NSAIDs and salicylates (goody powdr)  . Endometrial polyp 03/2003   s/p resection in 03/2003. Path showing submucosal leiomyoma and benign  proliferative type endometrium  . Fibroid uterus    s/p myomectomy x 2  . GERD (gastroesophageal reflux disease)   . H/O failed moderate sedation   . Hiatal hernia   . History of CVA (cerebrovascular accident) 10/2000  . 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 Hyperlipidemia   . Hypertension   . Hypothyroidism    with hx of multinodular goiter (noted on neck US in 11/2002 - showing diffuse nodularity and inhomogenous texture diffusely BL)  . Internal hemorrhoids    grade 2 per colonoscopy in 09/2006 - repeat colonoscopy rec in 5-10 years.  . Migraine   . Nevus   . Personal history of  failed conscious sedation 08/11/2011  . Tachycardia   . Thyroid disease   . Tobacco abuse Jan 2014   quit   Past Surgical History:  Procedure Laterality Date  . CHOLECYSTECTOMY    . COLONOSCOPY  02/07/2010  . HYSTEROSCOPY    . LAPAROSCOPIC APPENDECTOMY N/A 09/23/2015   Procedure: APPENDECTOMY LAPAROSCOPIC;  Surgeon: Georganna Skeans, MD;  Location: [facility];  Service: General;  Laterality: N/A;  . [place]   x 2 - In 1997, noted to have extensive pelvic adhesions and BL tubal obstruction  . TRANSTHORACIC ECHOCARDIOGRAM  06/2014    EF 55-60%.  No RWMA. Gr 1 DD. Cannot R/o PFO (consider bubble study).  Marland Kitchen UPPER GASTROINTESTINAL ENDOSCOPY  12/18/2009   Social History:   reports that she quit smoking about 3 years ago. Her smoking use included cigarettes. She has a  150.00 pack-year smoking history. She has never used smokeless tobacco. She reports that she does not drink alcohol or use drugs.  Family History  Problem Relation Age of Onset  . COPD Mother   . Heart disease Father 69       had 5 heart attacks, first in late 46s  . COPD Father     Medications: Patient's Medications  New Prescriptions   No medications on file  Previous Medications   ALBUTEROL (PROAIR HFA) 108 (90 BASE) MCG/ACT INHALER    INHALE 2 PUFFS EVERY 6  HOURS AS NEEDED FOR  SHORTNESS OF BREATH OR  WHEEZING   ALUM & MAG HYDROXIDE-SIMETH (MAALOX/MYLANTA) 200-200-20 MG/5ML SUSPENSION    Take by mouth every 6 (six) hours as needed for indigestion or heartburn.   ASPIRIN EC 81 MG TABLET    Take 81 mg by mouth daily.   ASPIRIN-ACETAMINOPHEN-CAFFEINE 940-768-08 MG PACK    Take 1 Package by mouth as needed.    ATORVASTATIN (LIPITOR) 80 MG TABLET    TAKE ONE TABLET BY MOUTH  ONCE DAILY AT BEDTIME FOR  CHOLESTEROL   BIOTIN 5000 MCG TABS    Take by mouth daily.   BUDESONIDE-FORMOTEROL (SYMBICORT) 160-4.5 MCG/ACT INHALER    2 puffs twice daily.   GLIPIZIDE (GLUCOTROL XL) 5 MG 24 HR TABLET    Take 1 tablet (5 mg  total) by mouth daily with breakfast.   HYDRALAZINE (APRESOLINE) 25 MG TABLET    Take 1 tablet (25 mg total) by mouth 3 (three) times daily.   IBUPROFEN (ADVIL,MOTRIN) 200 MG TABLET    Take 800 mg by mouth as needed for moderate pain.    IPRATROPIUM-ALBUTEROL (DUONEB) 0.5-2.5 (3) MG/3ML SOLN    Take 3 mLs by nebulization every 6 (six) hours as needed. For shortness of breath or wheezing   LEVOTHYROXINE (SYNTHROID, LEVOTHROID) 150 MCG TABLET    Take 1 tablet (150 mcg total) by mouth daily before breakfast.   LORAZEPAM (ATIVAN) 0.5 MG TABLET    TAKE 1 TABLET BY MOUTH 3  TIMES A DAY AS NEEDED FOR  ANXIETY OR NERVOUSNESS   LOSARTAN (COZAAR) 100 MG TABLET    TAKE 1 TABLET BY MOUTH  DAILY   METOPROLOL TARTRATE (LOPRESSOR) 100 MG TABLET    Take 1 tablet (100 mg total) by mouth 2 (two) times daily.   MULTIPLE VITAMINS-MINERALS (HAIR/SKIN/NAILS) TABS    Take by mouth daily.   OMEPRAZOLE (PRILOSEC) 20 MG CAPSULE    Take 1 capsule (20 mg total) by mouth daily.   ONETOUCH DELICA LANCETS 81J MISC    USE TWO TIMES DAILY   ONETOUCH VERIO TEST STRIP    CHECK BLOOD SUGAR TWO TIMES DAILY AND AS DIRECTED   TIOTROPIUM BROMIDE MONOHYDRATE (SPIRIVA RESPIMAT) 2.5 MCG/ACT AERS    Inhale 2 puffs into the lungs daily.  Modified Medications   No medications on file  Discontinued Medications   AMLODIPINE (NORVASC) 5 MG TABLET    Take 5 mg by mouth daily.   TIOTROPIUM BROMIDE MONOHYDRATE (SPIRIVA RESPIMAT) 2.5 MCG/ACT AERS    Inhale 2 puffs into the lungs daily for 1 day.     Physical Exam: unable due to tele-visit.     Labs reviewed: Basic Metabolic Panel: Recent Labs    10/30/17 1241 04/21/18 0918 06/17/18 0825  NA 140 142  --   K 4.5 3.8  --   CL 106 106  --   CO2 26 27  --   GLUCOSE 61* 76  --  BUN 16 14  --   CREATININE 0.85 0.70  --   CALCIUM 9.3 9.0  --   TSH 0.48 0.10* 1.51   Liver Function Tests: Recent Labs    10/30/17 1241 04/21/18 0918  AST 19 17  ALT 15 16  BILITOT 0.3 0.3  PROT  6.9 6.7   No results for input(s): LIPASE, AMYLASE in the last 8760 hours. No results for input(s): AMMONIA in the last 8760 hours. CBC: Recent Labs    04/22/18 0953  WBC 7.5  NEUTROABS 4,463  HGB 13.3  HCT 39.8  MCV 84.5  PLT 280   Lipid Panel: Recent Labs    10/30/17 1241 04/21/18 0918  CHOL 122 148  HDL 48* 52  LDLCALC 56 75  TRIG 98 124  CHOLHDL 2.5 2.8   TSH: Recent Labs    10/30/17 1241 04/21/18 0918 06/17/18 0825  TSH 0.48 0.10* 1.51   A1C: Lab Results  Component Value Date   HGBA1C 6.1 (H) 04/21/2018     Assessment/Plan 1. Essential hypertension -overall blood pressure has improved, however worsening shortness of breath with increase in lopressor. ? If this is cause. Will decrease lopressor to see if this improves symptoms. Will increase hydralazine to help with blood pressure control.  -continue heart healthy low sodium diet. - metoprolol tartrate (LOPRESSOR) 50 MG tablet; Take 2 tablets (100 mg total) by mouth 2 (two) times daily.  Dispense: 60 tablet; Refill: 1 - hydrALAZINE (APRESOLINE) 50 MG tablet; Take 1 tablet (50 mg total) by mouth 3 (three) times daily.  Dispense: 90 tablet; Refill: 2 - losartan (COZAAR) 100 MG tablet; Take 1 tablet (100 mg total) by mouth daily.  Dispense: 60 tablet; Refill: 2  2. COPD with asthma ([place]) Worsening of symptoms, continues to follow up with Dr Melvyn Novas. -continues to symbicort and spiriva.  -getting up at night with increase shortness of breath more so since lopressor has been increased, will decrease lopressor and monitor symptoms. To call if symptoms fail to improve or worsen.  3. Nausea and vomiting, intractability of vomiting not specified, unspecified vomiting type Thought to be related to food. Has improved at this time. Continue to advance diet as tolerates and stay hydrated, notify if symptoms reoccur  4. Advance care planning -request DNR, form completed and paperwork will be mailed for her to review.    Next appt: 1 week follow up [place]. Harle Battiest  [facility] [phone]   Follow Up Instructions:    I discussed the assessment and treatment plan with the patient. The patient was provided an opportunity to ask questions and all were answered. The patient agreed with the plan and demonstrated an understanding of the instructions.   The patient was advised to call back or seek an in-person evaluation if the symptoms worsen or if the condition fails to improve as anticipated.  I provided 29 minutes of non-face-to-face time during this encounter.   [name], NP

## 2018-08-31 ENCOUNTER — Encounter: Payer: Self-pay | Admitting: Nurse Practitioner

## 2018-09-01 ENCOUNTER — Other Ambulatory Visit: Payer: Self-pay

## 2018-09-01 ENCOUNTER — Ambulatory Visit (INDEPENDENT_AMBULATORY_CARE_PROVIDER_SITE_OTHER): Payer: Medicare Other | Admitting: Nurse Practitioner

## 2018-09-01 ENCOUNTER — Encounter: Payer: Self-pay | Admitting: Nurse Practitioner

## 2018-09-01 DIAGNOSIS — G43009 Migraine without aura, not intractable, without status migrainosus: Secondary | ICD-10-CM

## 2018-09-01 DIAGNOSIS — I10 Essential (primary) hypertension: Secondary | ICD-10-CM

## 2018-09-01 DIAGNOSIS — J449 Chronic obstructive pulmonary disease, unspecified: Secondary | ICD-10-CM | POA: Diagnosis not present

## 2018-09-01 NOTE — Progress Notes (Signed)
This service is provided via telemedicine  No vital signs collected/recorded due to the encounter was a telemedicine visit.   Location of patient (ex: home, work):Home  Patient consents to a telephone visit: Yes  Location of the provider (ex: office, home): [facility], Office   Name of any referring provider: N/A  Names of all persons participating in the telemedicine service and their role in the encounter: S.Chrae B/CMA, Sherrie Mustache, NP, and Patient   Time spent on call: 5 min with medical assistant      Careteam: Patient Care Team: [name], NP as PCP - General (Geriatric Medicine) Leonie Man, MD as PCP - Cardiology (Cardiology)  Advanced Directive information    Allergies  Allergen Reactions  . Amlodipine     Cramps   . Anoro Ellipta [Umeclidinium-Vilanterol] Other (See Comments)    Chest pain if used more than 3 consecutive days   . Codeine     REACTION: headaches  . Gabapentin     headache  . Prozac [Fluoxetine Hcl] Other (See Comments)    Suicidal ideations    Chief Complaint  Patient presents with  . Follow-up    Blood pressure and shortness of breath follow-up. Telephone visit      HPI: Patient is a 63 y.o. female for blood pressure follow up.  At last visit metoprolol was decreased to 50 mg by mouth twice daily due to worsening shortness of breath.  Hydralazine was increased to 50 TID, losartan was maintained at 100 mg by mouth daily.  Blood pressure ranging from 116-138/67-76 occasional blood pressure 150/89, 143/81 Thought breathing had gotten better but the last 2 days it has worsened. She has NOT gotten up at night struggling to breath.  Having headaches- feels like this may be related OSA but unable to have sleep study done due to cost. Had to get up at night to take some medicine to help.  Feels like a lot of breathing issues due to OSA. She was in a COPD study and it was suggested that she had OSA. Dr Eulas Post  ordered home test and it indicated that she stopped breathing (o2 level dropped) and she was told she needed a sleep study but unable to afford it. Has not mentioned this to pulmonary.  Continues on symbicort and spiriva, using albuterol every 6 hours routinely. Having to depend on this.  Used nebulizer the other day. Continues to have wheezing. No sputum production.  Had her normal dry cough that she has had for 2 years.   Migraines- ongoing, no increase in frequency but more severe when she has them.    Review of Systems:  Review of Systems  Constitutional: Positive for malaise/fatigue. Negative for chills and fever.  Respiratory: Positive for cough, shortness of breath and wheezing. Negative for hemoptysis and sputum production.   Cardiovascular: Positive for chest pain (occasionally, when HR in 80-90s does not have discomforts, no increase since lopressor has been decreased). Negative for palpitations and leg swelling.  Neurological: Positive for headaches. Negative for dizziness, tingling and weakness.    Past Medical History:  Diagnosis Date  . Abnormal vaginal bleeding   . Asthma   . Barrett's esophagus    with high grade dysplasia per endoscopy 05/2005 // followed by Dr. [name] ([facility])  . Chronic back pain   . COPD (chronic obstructive pulmonary disease) ([place])   . Diabetes mellitus without complication ([place])   . Duodenal ulcer disease  thought to be contributed at least partly by overuse of NSAIDs and salicylates (goody powdr)  . Endometrial polyp 03/2003   s/p resection in 03/2003. Path showing submucosal leiomyoma and benign  proliferative type endometrium  . Fibroid uterus    s/p myomectomy x 2  . GERD (gastroesophageal reflux disease)   . H/O failed moderate sedation   . Hiatal hernia   . History of CVA (cerebrovascular accident) 10/2000  . History of pineal cyst    11 mm cystic mass noted in the pineal gland per MRI in 2001 - most consitent with simple pineal  cyst  . Hyperlipidemia   . Hypertension   . Hypothyroidism    with hx of multinodular goiter (noted on neck US in 11/2002 - showing diffuse nodularity and inhomogenous texture diffusely BL)  . Internal hemorrhoids    grade 2 per colonoscopy in 09/2006 - repeat colonoscopy rec in 5-10 years.  . Migraine   . Nevus   . Personal history of failed conscious sedation 08/11/2011  . Tachycardia   . Thyroid disease   . Tobacco abuse Jan 2014   quit   Past Surgical History:  Procedure Laterality Date  . CHOLECYSTECTOMY    . COLONOSCOPY  02/07/2010  . HYSTEROSCOPY    . LAPAROSCOPIC APPENDECTOMY N/A 09/23/2015   Procedure: APPENDECTOMY LAPAROSCOPIC;  Surgeon: Georganna Skeans, MD;  Location: [place];  Service: General;  Laterality: N/A;  . [place]   x 2 - In 1997, noted to have extensive pelvic adhesions and BL tubal obstruction  . TRANSTHORACIC ECHOCARDIOGRAM  06/2014    EF 55-60%.  No RWMA. Gr 1 DD. Cannot R/o PFO (consider bubble study).  Marland Kitchen UPPER GASTROINTESTINAL ENDOSCOPY  12/18/2009   Social History:   reports that she quit smoking about 3 years ago. Her smoking use included cigarettes. She has a 150.00 pack-year smoking history. She has never used smokeless tobacco. She reports that she does not drink alcohol or use drugs.  Family History  Problem Relation Age of Onset  . COPD Mother   . Heart disease Father 65       had 5 heart attacks, first in late 45s  . COPD Father     Medications: Patient's Medications  New Prescriptions   No medications on file  Previous Medications   ALBUTEROL (PROAIR HFA) 108 (90 BASE) MCG/ACT INHALER    INHALE 2 PUFFS EVERY 6  HOURS AS NEEDED FOR  SHORTNESS OF BREATH OR  WHEEZING   ALUM & MAG HYDROXIDE-SIMETH (MAALOX/MYLANTA) 200-200-20 MG/5ML SUSPENSION    Take by mouth every 6 (six) hours as needed for indigestion or heartburn.   ASPIRIN EC 81 MG TABLET    Take 81 mg by mouth daily.   ASPIRIN-ACETAMINOPHEN-CAFFEINE 371-062-69 MG PACK     Take 1 Package by mouth as needed.    ATORVASTATIN (LIPITOR) 80 MG TABLET    TAKE ONE TABLET BY MOUTH  ONCE DAILY AT BEDTIME FOR  CHOLESTEROL   BIOTIN 5000 MCG TABS    Take by mouth daily.   BUDESONIDE-FORMOTEROL (SYMBICORT) 160-4.5 MCG/ACT INHALER    2 puffs twice daily.   GLIPIZIDE (GLUCOTROL XL) 5 MG 24 HR TABLET    Take 1 tablet (5 mg total) by mouth daily with breakfast.   GLUCOSE BLOOD (ONETOUCH VERIO) TEST STRIP    Use to check blood sugar twice daily. Dx: E11.9   HYDRALAZINE (APRESOLINE) 50 MG TABLET    Take 1 tablet (50 mg total) by mouth 3 (  three) times daily.   IBUPROFEN (ADVIL,MOTRIN) 200 MG TABLET    Take 800 mg by mouth as needed for moderate pain.    IPRATROPIUM-ALBUTEROL (DUONEB) 0.5-2.5 (3) MG/3ML SOLN    Take 3 mLs by nebulization every 6 (six) hours as needed. For shortness of breath or wheezing   LEVOTHYROXINE (SYNTHROID, LEVOTHROID) 150 MCG TABLET    Take 1 tablet (150 mcg total) by mouth daily before breakfast.   LORAZEPAM (ATIVAN) 0.5 MG TABLET    TAKE 1 TABLET BY MOUTH 3  TIMES A DAY AS NEEDED FOR  ANXIETY OR NERVOUSNESS   LOSARTAN (COZAAR) 100 MG TABLET    Take 1 tablet (100 mg total) by mouth daily.   METOPROLOL TARTRATE (LOPRESSOR) 50 MG TABLET    Take 2 tablets (100 mg total) by mouth 2 (two) times daily.   MULTIPLE VITAMINS-MINERALS (HAIR/SKIN/NAILS) TABS    Take by mouth daily.   OMEPRAZOLE (PRILOSEC) 20 MG CAPSULE    Take 1 capsule (20 mg total) by mouth daily.   ONETOUCH DELICA LANCETS 76E MISC    Use to check blood sugar twice daily. Dx: E11.9   TIOTROPIUM BROMIDE MONOHYDRATE (SPIRIVA RESPIMAT) 2.5 MCG/ACT AERS    Inhale 2 puffs into the lungs daily.  Modified Medications   No medications on file  Discontinued Medications   No medications on file     Physical Exam:  Labs reviewed: Basic Metabolic Panel: Recent Labs    10/30/17 1241 04/21/18 0918 06/17/18 0825  NA 140 142  --   K 4.5 3.8  --   CL 106 106  --   CO2 26 27  --   GLUCOSE 61* 76  --    BUN 16 14  --   CREATININE 0.85 0.70  --   CALCIUM 9.3 9.0  --   TSH 0.48 0.10* 1.51   Liver Function Tests: Recent Labs    10/30/17 1241 04/21/18 0918  AST 19 17  ALT 15 16  BILITOT 0.3 0.3  PROT 6.9 6.7   No results for input(s): LIPASE, AMYLASE in the last 8760 hours. No results for input(s): AMMONIA in the last 8760 hours. CBC: Recent Labs    04/22/18 0953  WBC 7.5  NEUTROABS 4,463  HGB 13.3  HCT 39.8  MCV 84.5  PLT 280   Lipid Panel: Recent Labs    10/30/17 1241 04/21/18 0918  CHOL 122 148  HDL 48* 52  LDLCALC 56 75  TRIG 98 124  CHOLHDL 2.5 2.8   TSH: Recent Labs    10/30/17 1241 04/21/18 0918 06/17/18 0825  TSH 0.48 0.10* 1.51   A1C: Lab Results  Component Value Date   HGBA1C 6.1 (H) 04/21/2018     Assessment/Plan 1. Essential hypertension -Controlled on current regimen. Will continue lopressor 50 mg BID, hydralazine 50 mg TID and losartan 50 mg daily  2. COPD with asthma ([place]) Ongoing shortness of breath, needs evaluation for OSA which she has been told she most likely has in the past but has not been evaluated for due to cost. Encouraged follow up with pulmonary for further management of pulmonary disease.  3. Migraine without aura and without status migrainosus, not intractable Ongoing, feels like OSA contributing since she is waking up with them. More intense when she has them but not more frequent. uses Goodies Powder to stop headaches. Plans to discuss evaluation with pulmonary.   Next appt: 11/11/2018, sooner if needed  Janett Billow K. Harle Battiest  [facility] [phone]    Virtual Visit via Telephone Note  I connected with@ on 09/01/18 at 12:00 PM EDT by telephone and verified that I am speaking with the correct person using two identifiers.  Location: Patient: home Provider: office   I discussed the limitations, risks, security and privacy concerns of performing an evaluation and management service  by telephone and the availability of in person appointments. I also discussed with the patient that there may be a patient responsible charge related to this service. The patient expressed understanding and agreed to proceed.   I discussed the assessment and treatment plan with the patient. The patient was provided an opportunity to ask questions and all were answered. The patient agreed with the plan and demonstrated an understanding of the instructions.   The patient was advised to call back or seek an in-person evaluation if the symptoms worsen or if the condition fails to improve as anticipated.  I provided 18 minutes of non-face-to-face time during this encounter.  Carlos American. Harle Battiest Avs printed and mailed

## 2018-09-01 NOTE — Telephone Encounter (Signed)
B/P log printed and placed in [name], NP review and sign folder

## 2018-09-08 NOTE — Telephone Encounter (Signed)
MW - please advise if you would be willing to sign these forms.

## 2018-09-10 NOTE — Telephone Encounter (Signed)
I don't see any paperwork on that but happy to sign where needed

## 2018-09-13 NOTE — Telephone Encounter (Signed)
Emailed patient that it is okay to bring by the papers to be signed MW is okay to sign the documents. Awaiting paperwork

## 2018-09-14 ENCOUNTER — Telehealth: Payer: Self-pay

## 2018-09-14 NOTE — Telephone Encounter (Signed)
Per Hinton Dyer, MW has signed pt's disability papers. Called and spoke with pt letting her know that MW had taken care of paperwork and asked if she wanted to come by office to pick it up or if she wanted [place] to place it in the mail and pt stated to have it placed in the mail for her. Nothing further needed.

## 2018-09-14 NOTE — Telephone Encounter (Signed)
Received the disability papers from [facility] file up front and handed it to [place] who is currently working with MW today.  Routing encounter to [facility]

## 2018-09-14 NOTE — Telephone Encounter (Signed)
Patient stopped by office to drop off disability paper work. Paper work placed in Dr. Gustavus Bryant box. Will route message to triage to send to nurse to f/u.

## 2018-09-19 ENCOUNTER — Other Ambulatory Visit: Payer: Self-pay | Admitting: Nurse Practitioner

## 2018-09-19 DIAGNOSIS — I10 Essential (primary) hypertension: Secondary | ICD-10-CM

## 2018-09-20 NOTE — Telephone Encounter (Signed)
Opened in error

## 2018-10-06 ENCOUNTER — Other Ambulatory Visit: Payer: Self-pay

## 2018-10-06 ENCOUNTER — Encounter: Payer: Self-pay | Admitting: Nurse Practitioner

## 2018-10-06 DIAGNOSIS — I10 Essential (primary) hypertension: Secondary | ICD-10-CM

## 2018-10-06 MED ORDER — HYDRALAZINE HCL 50 MG PO TABS: 50.0000 mg | ORAL_TABLET | Freq: Three times a day (TID) | ORAL | 1 refills | Status: DC

## 2018-10-26 ENCOUNTER — Ambulatory Visit: Payer: Medicare Other | Admitting: Internal Medicine

## 2018-11-07 ENCOUNTER — Other Ambulatory Visit: Payer: Self-pay | Admitting: Internal Medicine

## 2018-11-07 DIAGNOSIS — E1140 Type 2 diabetes mellitus with diabetic neuropathy, unspecified: Secondary | ICD-10-CM

## 2018-11-08 NOTE — Telephone Encounter (Signed)
Per the pharmacy notes, patient is requesting a year supply. Message forwarded to [name], NP for approval

## 2018-11-11 ENCOUNTER — Ambulatory Visit: Payer: Medicare Other | Admitting: Nurse Practitioner

## 2018-12-01 ENCOUNTER — Encounter: Payer: Self-pay | Admitting: Nurse Practitioner

## 2018-12-01 NOTE — Telephone Encounter (Signed)
Routed to [name], NP for any additional recommendations

## 2018-12-03 ENCOUNTER — Other Ambulatory Visit: Payer: Self-pay | Admitting: Nurse Practitioner

## 2018-12-03 DIAGNOSIS — E034 Atrophy of thyroid (acquired): Secondary | ICD-10-CM

## 2018-12-06 ENCOUNTER — Other Ambulatory Visit: Payer: Self-pay | Admitting: Nurse Practitioner

## 2018-12-06 DIAGNOSIS — I10 Essential (primary) hypertension: Secondary | ICD-10-CM

## 2018-12-27 ENCOUNTER — Other Ambulatory Visit: Payer: Self-pay | Admitting: Internal Medicine

## 2018-12-29 NOTE — Telephone Encounter (Signed)
Attempted to call patient to relay information. Left voicemail.  Sent mychart message from Dr. Melvyn Novas of his recommendations.

## 2018-12-29 NOTE — Telephone Encounter (Signed)
Called and spoke to patient.  Patient has appointment scheduled for October. Patient reports she has enough medication until visit but doesn't feel like her medications are working that well. Patient offered earlier appointment but she declined stating that she won't be able to afford visit until October. Nothing further needed at this time.

## 2018-12-29 NOTE — Telephone Encounter (Signed)
Let her know this is why we need to see her  1) we have plenty of samples to offset the cost of the visit and sort out what her insurance pays the most for or fill out the paperwork to get it for free if she qualifies  2) if not doing great on symb/spiriva there is a definite problem that I can't sort out over the phone    3) if we can't see her before her supply runs out, ok to give one sample only of what we have to give her time to get in with all meds/ inhalers in hand

## 2018-12-29 NOTE — Telephone Encounter (Signed)
Received below email from patient.  Patient's last office visit was 04/26/2018 with Dr. Melvyn Novas.  Please advise, thank you!   "[facility] informed me that the request to refill my Spiriva Respimat has been denied. I need that, also my Proair has 0 refills and Symbicort has only 1 refill left. If Dr. Melvyn Novas feels he must see me, I really do not have the money to come in. I am having a very hard time financially right now. I really can't afford to keep adding to what I already owe, but if Dr. Melvyn Novas is not comfortable with refilling these prescriptions, although I can't spare any money, perhaps we could do a televisit over the computer. I can send all my vitals to him as I check my glucose, oxygen, BP, temp, and pulse daily. I check everything except my glucose twice a day and keep a record that I can scan and send. My breathing has gotten worse, and the Symbicort and Spiriva do not seem to last as long as they are supposed to and I have had to use the nebulizer a few times, although not daily and most of the time not even weekly."

## 2019-01-12 ENCOUNTER — Other Ambulatory Visit: Payer: Self-pay | Admitting: Internal Medicine

## 2019-01-17 ENCOUNTER — Ambulatory Visit (INDEPENDENT_AMBULATORY_CARE_PROVIDER_SITE_OTHER): Payer: Medicare Other | Admitting: Internal Medicine

## 2019-01-17 ENCOUNTER — Other Ambulatory Visit: Payer: Self-pay

## 2019-01-17 ENCOUNTER — Encounter: Payer: Self-pay | Admitting: Internal Medicine

## 2019-01-17 ENCOUNTER — Telehealth: Payer: Self-pay | Admitting: Internal Medicine

## 2019-01-17 DIAGNOSIS — K22719 Barrett's esophagus with dysplasia, unspecified: Secondary | ICD-10-CM | POA: Diagnosis not present

## 2019-01-17 DIAGNOSIS — J449 Chronic obstructive pulmonary disease, unspecified: Secondary | ICD-10-CM | POA: Diagnosis not present

## 2019-01-17 DIAGNOSIS — I10 Essential (primary) hypertension: Secondary | ICD-10-CM

## 2019-01-17 MED ORDER — OMEPRAZOLE 20 MG PO CPDR: DELAYED_RELEASE_CAPSULE | ORAL | 3 refills | Status: DC

## 2019-01-17 MED ORDER — BISOPROLOL FUMARATE 5 MG PO TABS: ORAL_TABLET | ORAL | 11 refills | Status: DC

## 2019-01-17 MED ORDER — ALBUTEROL SULFATE (2.5 MG/3ML) 0.083% IN NEBU: 2.5000 mg | INHALATION_SOLUTION | RESPIRATORY_TRACT | 12 refills | Status: DC | PRN

## 2019-01-17 MED ORDER — PREDNISONE 10 MG PO TABS: ORAL_TABLET | ORAL | 0 refills | Status: DC

## 2019-01-17 NOTE — Progress Notes (Addendum)
Grace [name], female    DOB: 10-12-1955,     MRN: [id number]    Brief patient profile:  77 yowf MM quit smoking 2017 with resp failure following emergency appy with dx of copd but no pfts in epic referred to pulmonary clinic 03/25/2018 by [name] for copd with GOLD III criteria on initial eval.    History of Present Illness  03/25/2018  Pulmonary/ 1st office eval/Grace [name]  Chief Complaint  Patient presents with  . Pulmonary Consult    Referred by Dr. Sherrie Mustache. Pt c/o SOB for the past 6 months. She states she is SOB "all the time". She states she wakes up occ in the night gasping for air. She gets SOB walking from one end of her house to the other. She uses her proair inhaler on average 3  per day.   Dyspnea:  Indolent onset min progessive x ? 5 years now to [place] where slow walk  Push buggy at [facility]  = MMRC3 = can't walk 100 yards even at a slow pace at a flat grade s stopping due to sob   Cough: in am min mucoid worse in cold weather Sleep: on side / 2 pillows - wakes up maybe once a week gasping for air SABA use: first thing in am then spiriva after that  Plus sev more saba during the day if "overdoes it"  rec Handicap sticker today - consider pulmonary rehab next visit  Stop lisinopril and spiriva  Start losartan 50 mg daily  Plan A = Automatic = Bevespi Take 2 puffs first thing in am and then another 2 puffs about 12 hours later.  Work on inhaler technique:  Plan B = Backup Only use your albuterol inhaler as a rescue medication   Plan C = Crisis - only use your albuterol nebulizer if you first try Plan B and it fails to help > ok to use the nebulizer up to every 4 hours but if start needing it regularly call for immediate appointment Please schedule a follow up office visit in 4 weeks, sooner if needed with pfts and cxr and bring all active medications with you  - needs alpha one screen on return      04/26/2018  f/u ov/Grace [name] re:  COPD GOLD III with reversiblity   Chief Complaint  Patient presents with  . Follow-up    Pt has chest pain and tightness, dry cough, and some SOB with exertion.  Dyspnea: improved but still = MMRC2 = can't walk a nl pace on a flat grade s sob but does fine slow and flat  Cough: dry cough assoc with gen ant cp with coughing /min chest tightness  Sleeping: bed is horizontal/ one pillow  SABA use: much less  02: no rec Plan A = Automatic = try symbicort 160 x 2 first thing in am and chase with spiriva x 2 pffs then 12 hours later just the symbicort x 2  X 12 hours later Work on inhaler technique: Plan B = Backup Only use your albuterol(proair)  inhaler as a rescue medication  Plan C = Crisis - only use your albuterol nebulizer if you first try Plan B   Please remember to go to the lab department   for your tests - we will call you with the results when they are available. Add:  Needs cxr on return    Virtual Visit via Telephone Note 01/17/2019   I connected with Grace [name] on 01/17/19  at  9:10 AM EDT by telephone and verified that I am speaking with the correct person using two identifiers.   I discussed the limitations, risks, security and privacy concerns of performing an evaluation and management service by telephone and the availability of in person appointments. I also discussed with the patient that there may be a patient responsible charge related to this service. The patient expressed understanding and agreed to proceed.   History of Present Illness:  GOLD III/ maint symb/spiriva Dyspnea: room to room better p neb x months  Cough: not much Sleeping: freq wakes > pos osa per outpt study   SABA use: hfa does not work  02: none    No obvious day to day or daytime variability or assoc excess/ purulent sputum or mucus plugs or hemoptysis or cp or chest tightness, subjective wheeze or overt sinus or hb symptoms.    Also denies any obvious fluctuation of symptoms with weather or environmental changes or other  aggravating or alleviating factors except as outlined above.   Meds reviewed/ med reconciliation completed         Observations/Objective: Sounds great on the phone, good phonation, no cough or wob apparent.  Assessment and Plan: See problem list for active a/p's   Follow Up Instructions: See avs for instructions unique to this ov which includes revised/ updated med list     I discussed the assessment and treatment plan with the patient. The patient was provided an opportunity to ask questions and all were answered. The patient agreed with the plan and demonstrated an understanding of the instructions.   The patient was advised to call back or seek an in-person evaluation if the symptoms worsen or if the condition fails to improve as anticipated.  I provided 30 minutes of non-face-to-face time during this encounter.   Grace Gully, MD

## 2019-01-17 NOTE — Telephone Encounter (Signed)
Called [facility] and let them know that was just one inhaler with zero refills.   Nothing further needed at this time.

## 2019-01-17 NOTE — Telephone Encounter (Signed)
Called and spoke with pharmacist from [facility] order states spiriva ffor 1 day on refill sent 10/1.   Dr. Melvyn Novas please advise if this is daily or was patient on spiriva for one day only

## 2019-01-17 NOTE — Assessment & Plan Note (Signed)
Stopped smoking 2017 - Spirometry 03/25/2018  FEV1 0.7 (31%)  Ratio 47 p am spiriva/ typical curvature - 03/25/2018   Walked RA  2 laps @ 266ft each @ slt faster than avg pace  stopped due to end of study / sob but no  desat  - 03/25/2018   try Bevespi   - PFT's  04/26/2018  FEV1 0.87 (35 % ) ratio 46  p 23 % improvement from saba p bevespi prior to study with DLCO  73 % corrects to 115  % for alv volume   - 04/26/2018  After extensive coaching inhaler device,  effectiveness =    75% > resume symb 160/spiriva smi - alpha one AT screen 04/26/2018  MM  162   Not able to make it all day without saba suggesting dtc asthma component:   1) ? gerd > double ppi / diet  2) ? Active asthma component > Prednisone 10 mg take  4 each am x 2 days,   2 each am x 2 days,  1 each am x 2 days and stop   3) ? BB effects (see separate a/p)    F/u in 2-3 weeks with all meds in hand using a trust but verify approach to confirm accurate Medication  Reconciliation The principal here is that until we are certain that the  patients are doing what we've asked, it makes no sense to ask them to do more.

## 2019-01-17 NOTE — Assessment & Plan Note (Signed)
Try off acei 03/25/2018 due to "noct smothering"> some better - changed lopressor to bisoprolol 01/17/2019 due to dtc copd   In the setting of respiratory symptoms of unknown etiology,  It would be preferable to use bystolic, the most beta -1  selective Beta blocker available in sample form, with bisoprolol the most selective generic choice  on the market, at least on a trial basis, to make sure the spillover Beta 2 effects of the less specific Beta blockers are not contributing to this patient's symptoms.    Try bisoprolol 5 mg bid and titrate up if needed   Each maintenance medication was reviewed in detail including most importantly the difference between maintenance and as needed and under what circumstances the prns are to be used.  Please see AVS for specific  Instructions which are unique to this visit and I personally typed out  which were reviewed in detail over the phone with the patient and a copy provided via mychart

## 2019-01-17 NOTE — Patient Instructions (Addendum)
Patient has MyChart but needs appt  Omeprazole 20 mg Take 30- 60 min before your first and last meals of the day until ov   GERD (REFLUX)  is an extremely common cause of respiratory symptoms just like yours , many times with no obvious heartburn at all.    It can be treated with medication, but also with lifestyle changes including elevation of the head of your bed (ideally with 6 -8inch blocks under the headboard of your bed),  Smoking cessation, avoidance of late meals, excessive alcohol, and avoid fatty foods, chocolate, peppermint, colas, red wine, and acidic juices such as orange juice.  NO MINT OR MENTHOL PRODUCTS SO NO COUGH DROPS  USE SUGARLESS CANDY INSTEAD (Jolley ranchers or Stover's or Life Savers) or even ice chips will also do - the key is to swallow to prevent all throat clearing. NO OIL BASED VITAMINS - use powdered substitutes.  Avoid fish oil when coughing.     Stop lopressor and replace bisoprolol 5 mg twice daily   Prednisone 10 mg take  4 each am x 2 days,   2 each am x 2 days,  1 each am x 2 days and stop   If you are note responding to proair then go ahead and using the nebulizer albuterol (not the duoneb which has an extra ingredient called ipatropium than you don't need)  up to every 4 hours ifneeded if you are at home.    Please schedule a follow up office visit in 2-3 weeks, sooner if needed  with all medications /inhalers/ solutions in hand so we can verify exactly what you are taking. This includes all medications from all doctors and over the counters

## 2019-01-17 NOTE — Telephone Encounter (Signed)
I only want her to get one spiriva inhaler (4 weeks supply)  so we can address the issue of longterm rx at next ov which should be w/in next 4 weeks

## 2019-01-21 ENCOUNTER — Encounter: Payer: Self-pay | Admitting: Nurse Practitioner

## 2019-01-25 ENCOUNTER — Telehealth: Payer: Self-pay | Admitting: Internal Medicine

## 2019-01-25 NOTE — Telephone Encounter (Signed)
Spoke with the pharmacist at [facility] and verified that we stopped her metoprolol and started bisoprolol 5 mg bid on 01/17/19  She verbalized understanding and nothing further needed

## 2019-01-26 ENCOUNTER — Encounter: Payer: Self-pay | Admitting: Nurse Practitioner

## 2019-01-26 ENCOUNTER — Other Ambulatory Visit: Payer: Self-pay

## 2019-01-26 ENCOUNTER — Ambulatory Visit (INDEPENDENT_AMBULATORY_CARE_PROVIDER_SITE_OTHER): Payer: Medicare Other | Admitting: Nurse Practitioner

## 2019-01-26 VITALS — BP 156/84 | HR 89 | Temp 98.3°F | Resp 20 | Ht 63.0 in | Wt 205.2 lb

## 2019-01-26 DIAGNOSIS — J449 Chronic obstructive pulmonary disease, unspecified: Secondary | ICD-10-CM

## 2019-01-26 DIAGNOSIS — E034 Atrophy of thyroid (acquired): Secondary | ICD-10-CM | POA: Diagnosis not present

## 2019-01-26 DIAGNOSIS — Z598 Other problems related to housing and economic circumstances: Secondary | ICD-10-CM

## 2019-01-26 DIAGNOSIS — R252 Cramp and spasm: Secondary | ICD-10-CM

## 2019-01-26 DIAGNOSIS — E785 Hyperlipidemia, unspecified: Secondary | ICD-10-CM

## 2019-01-26 DIAGNOSIS — Z599 Problem related to housing and economic circumstances, unspecified: Secondary | ICD-10-CM

## 2019-01-26 DIAGNOSIS — F419 Anxiety disorder, unspecified: Secondary | ICD-10-CM

## 2019-01-26 DIAGNOSIS — I10 Essential (primary) hypertension: Secondary | ICD-10-CM | POA: Diagnosis not present

## 2019-01-26 DIAGNOSIS — E1140 Type 2 diabetes mellitus with diabetic neuropathy, unspecified: Secondary | ICD-10-CM

## 2019-01-26 DIAGNOSIS — Z6837 Body mass index (BMI) 37.0-37.9, adult: Secondary | ICD-10-CM

## 2019-01-26 DIAGNOSIS — G4733 Obstructive sleep apnea (adult) (pediatric): Secondary | ICD-10-CM

## 2019-01-26 DIAGNOSIS — Z9989 Dependence on other enabling machines and devices: Secondary | ICD-10-CM

## 2019-01-26 DIAGNOSIS — J4489 Other specified chronic obstructive pulmonary disease: Secondary | ICD-10-CM

## 2019-01-26 DIAGNOSIS — Z23 Encounter for immunization: Secondary | ICD-10-CM

## 2019-01-26 DIAGNOSIS — G4700 Insomnia, unspecified: Secondary | ICD-10-CM

## 2019-01-26 MED ORDER — LORAZEPAM 0.5 MG PO TABS: ORAL_TABLET | ORAL | 2 refills | Status: DC

## 2019-01-26 MED ORDER — MELATONIN 10 MG PO TABS: 1.0000 | ORAL_TABLET | Freq: Every day | ORAL | Status: DC

## 2019-01-26 MED ORDER — IPRATROPIUM-ALBUTEROL 0.5-2.5 (3) MG/3ML IN SOLN: 3.0000 mL | Freq: Four times a day (QID) | RESPIRATORY_TRACT | 2 refills | Status: DC | PRN

## 2019-01-26 MED ORDER — LOSARTAN POTASSIUM 100 MG PO TABS: 100.0000 mg | ORAL_TABLET | Freq: Every day | ORAL | 1 refills | Status: DC

## 2019-01-26 NOTE — Patient Instructions (Addendum)
Titrate melatonin down- try taking lower doses 1-3 mg by mouth at bedtime to see if this helps with sleep.  Referral to [facility] placed to see if they can assist with finances  Call pulmonary office and ask to speak with office manager in regards to changing pulmonologist    Notify if headache persist  To follow up in 6 weeks for CPE- needs PAP Insomnia Insomnia is a sleep disorder that makes it difficult to fall asleep or stay asleep. Insomnia can cause fatigue, low energy, difficulty concentrating, mood swings, and poor performance at work or school. There are three different ways to classify insomnia:  Difficulty falling asleep.  Difficulty staying asleep.  Waking up too early in the morning. Any type of insomnia can be long-term (chronic) or short-term (acute). Both are common. Short-term insomnia usually lasts for three months or less. Chronic insomnia occurs at least three times a week for longer than three months. What are the causes? Insomnia may be caused by another condition, situation, or substance, such as:  Anxiety.  Certain medicines.  Gastroesophageal reflux disease (GERD) or other gastrointestinal conditions.  Asthma or other breathing conditions.  Restless legs syndrome, sleep apnea, or other sleep disorders.  Chronic pain.  Menopause.  Stroke.  Abuse of alcohol, tobacco, or illegal drugs.  Mental health conditions, such as depression.  Caffeine.  Neurological disorders, such as Alzheimer's disease.  An overactive thyroid (hyperthyroidism). Sometimes, the cause of insomnia may not be known. What increases the risk? Risk factors for insomnia include:  Gender. Women are affected more often than men.  Age. Insomnia is more common as you get older.  Stress.  Lack of exercise.  Irregular work schedule or working night shifts.  Traveling between different time zones.  Certain medical and mental health conditions. What are the signs or  symptoms? If you have insomnia, the main symptom is having trouble falling asleep or having trouble staying asleep. This may lead to other symptoms, such as:  Feeling fatigued or having low energy.  Feeling nervous about going to sleep.  Not feeling rested in the morning.  Having trouble concentrating.  Feeling irritable, anxious, or depressed. How is this diagnosed? This condition may be diagnosed based on:  Your symptoms and medical history. Your health care provider may ask about: ? Your sleep habits. ? Any medical conditions you have. ? Your mental health.  A physical exam. How is this treated? Treatment for insomnia depends on the cause. Treatment may focus on treating an underlying condition that is causing insomnia. Treatment may also include:  Medicines to help you sleep.  Counseling or therapy.  Lifestyle adjustments to help you sleep better. Follow these instructions at home: Eating and drinking   Limit or avoid alcohol, caffeinated beverages, and cigarettes, especially close to bedtime. These can disrupt your sleep.  Do not eat a large meal or eat spicy foods right before bedtime. This can lead to digestive discomfort that can make it hard for you to sleep. Sleep habits   Keep a sleep diary to help you and your health care provider figure out what could be causing your insomnia. Write down: ? When you sleep. ? When you wake up during the night. ? How well you sleep. ? How rested you feel the next day. ? Any side effects of medicines you are taking. ? What you eat and drink.  Make your bedroom a dark, comfortable place where it is easy to fall asleep. ? Put up shades or blackout  curtains to block light from outside. ? Use a white noise machine to block noise. ? Keep the temperature cool.  Limit screen use before bedtime. This includes: ? Watching TV. ? Using your smartphone, tablet, or computer.  Stick to a routine that includes going to bed and  waking up at the same times every day and night. This can help you fall asleep faster. Consider making a quiet activity, such as reading, part of your nighttime routine.  Try to avoid taking naps during the day so that you sleep better at night.  Get out of bed if you are still awake after 15 minutes of trying to sleep. Keep the lights down, but try reading or doing a quiet activity. When you feel sleepy, go back to bed. General instructions  Take over-the-counter and prescription medicines only as told by your health care provider.  Exercise regularly, as told by your health care provider. Avoid exercise starting several hours before bedtime.  Use relaxation techniques to manage stress. Ask your health care provider to suggest some techniques that may work well for you. These may include: ? Breathing exercises. ? Routines to release muscle tension. ? Visualizing peaceful scenes.  Make sure that you drive carefully. Avoid driving if you feel very sleepy.  Keep all follow-up visits as told by your health care provider. This is important. Contact a health care provider if:  You are tired throughout the day.  You have trouble in your daily routine due to sleepiness.  You continue to have sleep problems, or your sleep problems get worse. Get help right away if:  You have serious thoughts about hurting yourself or someone else. If you ever feel like you may hurt yourself or others, or have thoughts about taking your own life, get help right away. You can go to your nearest emergency department or call:  Your local emergency services (911 in the [place]  A suicide crisis helpline, such as the [facility] at (631) 649-7980. This is open 24 hours a day. Summary  Insomnia is a sleep disorder that makes it difficult to fall asleep or stay asleep.  Insomnia can be long-term (chronic) or short-term (acute).  Treatment for insomnia depends on the cause. Treatment  may focus on treating an underlying condition that is causing insomnia.  Keep a sleep diary to help you and your health care provider figure out what could be causing your insomnia. This information is not intended to replace advice given to you by your health care provider. Make sure you discuss any questions you have with your health care provider. Document Released: 03/28/2000 Document Revised: 03/13/2017 Document Reviewed: 01/08/2017 Elsevier Patient Education  2020 [employer].

## 2019-01-26 NOTE — Progress Notes (Signed)
Careteam: Patient Care Team: [name], NP as PCP - General (Geriatric Medicine) Leonie Man, MD as PCP - Cardiology (Cardiology)  Advanced Directive information    Allergies  Allergen Reactions   Amlodipine     Cramps    Anoro Ellipta [Umeclidinium-Vilanterol] Other (See Comments)    Chest pain if used more than 3 consecutive days    Codeine     REACTION: headaches   Gabapentin     headache   Prozac [Fluoxetine Hcl] Other (See Comments)    Suicidal ideations    Chief Complaint  Patient presents with   Medical Management of Chronic Issues     HPI: Patient is a 63 y.o. female seen in the office today for routine follow up.  Not following with cardiologist because he wants her to have a test done but she can not afford it. Continues to have chest pains, has not had in a few days. Feels like this could be due to COPD  COPD- following with Dr Melvyn Novas, wants a new pulmonary doctor. Does not feel like he listens to her. Feels like medication is not lasting 12 hours. There was supposed to be follow up but has not heard from.  She was taken off metoprolol and started on bisoprolol- waiting on from the pharmacy.  Started her on prednisone taper.  Also mix up in her prescription- she wanted duoneb but albuterol was called in and called into wrong pharmacy which was going to cost her way too much- could not afford [employer] covers nebulizers   Migraines- worsening headaches but now using CPAP  she is in a "test" with CPAP machine- she has OSA and at the moderate to severe. She has a machine to use for 90 days and after this she will have to answer questions, hopefully be able to keep this long term without going through a different agency   htn- home blood pressure reviewed. Controlled on losartan 100 mg daily, lopressor twice daily- however this medication plans to change and hydralazine TID       Review of Systems:  Review of Systems  Constitutional:  Positive for malaise/fatigue. Negative for chills and fever.  Respiratory: Positive for cough, shortness of breath and wheezing. Negative for hemoptysis and sputum production.        Improved with prednisone  Cardiovascular: Positive for chest pain (off and on). Negative for palpitations and leg swelling.  Gastrointestinal: Positive for heartburn.  Neurological: Positive for headaches. Negative for dizziness, tingling and weakness.  Psychiatric/Behavioral: Positive for depression. The patient is nervous/anxious.     Past Medical History:  Diagnosis Date   Abnormal vaginal bleeding    Asthma    Barrett's esophagus    with high grade dysplasia per endoscopy 05/2005 // followed by Dr. [name] ([facility])   Chronic back pain    COPD (chronic obstructive pulmonary disease) ([place])    Diabetes mellitus without complication ([place])    Duodenal ulcer disease    thought to be contributed at least partly by overuse of NSAIDs and salicylates (goody powdr)   Endometrial polyp 03/2003   s/p resection in 03/2003. Path showing submucosal leiomyoma and benign  proliferative type endometrium   Fibroid uterus    s/p myomectomy x 2   GERD (gastroesophageal reflux disease)    H/O failed moderate sedation    Hiatal hernia    History of CVA (cerebrovascular accident) 10/2000   History of pineal cyst    11 mm cystic  mass noted in the pineal gland per MRI in 2001 - most consitent with simple pineal cyst   Hyperlipidemia    Hypertension    Hypothyroidism    with hx of multinodular goiter (noted on neck US in 11/2002 - showing diffuse nodularity and inhomogenous texture diffusely BL)   Internal hemorrhoids    grade 2 per colonoscopy in 09/2006 - repeat colonoscopy rec in 5-10 years.   Migraine    Nevus    Personal history of failed conscious sedation 08/11/2011   Tachycardia    Thyroid disease    Tobacco abuse Jan 2014   quit   Past Surgical History:  Procedure Laterality Date     CHOLECYSTECTOMY     COLONOSCOPY  02/07/2010   HYSTEROSCOPY     LAPAROSCOPIC APPENDECTOMY N/A 09/23/2015   Procedure: APPENDECTOMY LAPAROSCOPIC;  Surgeon: Georganna Skeans, MD;  Location: [facility];  Service: General;  Laterality: N/A;   [place]   x 2 - In 1997, noted to have extensive pelvic adhesions and BL tubal obstruction   TRANSTHORACIC ECHOCARDIOGRAM  06/2014    EF 55-60%.  No RWMA. Gr 1 DD. Cannot R/o PFO (consider bubble study).   UPPER GASTROINTESTINAL ENDOSCOPY  12/18/2009   Social History:   reports that she quit smoking about 3 years ago. Her smoking use included cigarettes. She has a 150.00 pack-year smoking history. She has never used smokeless tobacco. She reports that she does not drink alcohol or use drugs.  Family History  Problem Relation Age of Onset   COPD Mother    Heart disease Father 60       had 5 heart attacks, first in late 23s   COPD Father     Medications: Patient's Medications  New Prescriptions   No medications on file  Previous Medications   ALBUTEROL (PROAIR HFA) 108 (90 BASE) MCG/ACT INHALER    INHALE 2 PUFFS EVERY 6  HOURS AS NEEDED FOR  SHORTNESS OF BREATH OR  WHEEZING   ALBUTEROL (PROVENTIL) (2.5 MG/3ML) 0.083% NEBULIZER SOLUTION    Take 3 mLs (2.5 mg total) by nebulization every 4 (four) hours as needed for wheezing or shortness of breath.   ALUM & MAG HYDROXIDE-SIMETH (MAALOX/MYLANTA) [id number] MG/5ML SUSPENSION    Take by mouth every 6 (six) hours as needed for indigestion or heartburn.   ASPIRIN EC 81 MG TABLET    Take 81 mg by mouth daily.   ASPIRIN-ACETAMINOPHEN-CAFFEINE [id number] MG PACK    Take 1 Package by mouth as needed.    ATORVASTATIN (LIPITOR) 80 MG TABLET    TAKE ONE TABLET BY MOUTH  ONCE DAILY AT BEDTIME FOR  CHOLESTEROL   BIOTIN 5000 MCG TABS    Take by mouth daily.   BISOPROLOL (ZEBETA) 5 MG TABLET    One twice daily   BUDESONIDE-FORMOTEROL (SYMBICORT) 160-4.5 MCG/ACT INHALER    2 puffs twice daily.    GLIPIZIDE (GLUCOTROL XL) 5 MG 24 HR TABLET    TAKE 1 TABLET BY MOUTH  DAILY WITH BREAKFAST   GLUCOSE BLOOD (ONETOUCH VERIO) TEST STRIP    Use to check blood sugar twice daily. Dx: E11.9   HYDRALAZINE (APRESOLINE) 50 MG TABLET    Take 1 tablet (50 mg total) by mouth 3 (three) times daily.   IBUPROFEN (ADVIL,MOTRIN) 200 MG TABLET    Take 800 mg by mouth as needed for moderate pain.    LEVOTHYROXINE (SYNTHROID) 150 MCG TABLET    TAKE 1 TABLET BY MOUTH  DAILY BEFORE  BREAKFAST   LORAZEPAM (ATIVAN) 0.5 MG TABLET    TAKE 1 TABLET BY MOUTH 3  TIMES A DAY AS NEEDED FOR  ANXIETY OR NERVOUSNESS   LOSARTAN (COZAAR) 100 MG TABLET    TAKE 1 TABLET BY MOUTH  DAILY   MULTIPLE VITAMINS-MINERALS (HAIR/SKIN/NAILS) TABS    Take by mouth daily.   OMEPRAZOLE (PRILOSEC) 20 MG CAPSULE    Take 30- 60 min before your first and last meals of the day   ONETOUCH DELICA LANCETS [id number] MISC    Use to check blood sugar twice daily. Dx: E11.9   PREDNISONE (DELTASONE) 10 MG TABLET    Take  4 each am x 2 days,   2 each am x 2 days,  1 each am x 2 days and stop   SPIRIVA RESPIMAT 2.5 MCG/ACT AERS    INHALE 2 PUFFS INTO THE  LUNGS DAILY FOR 1 DAY.  Modified Medications   No medications on file  Discontinued Medications   No medications on file    Physical Exam:  Vitals:   01/26/19 0838  BP: (!) 156/84  Pulse: 89  Resp: 20  Temp: 98.3 F (36.8 C)  SpO2: 96%  Weight: 205 lb 3.2 oz (93.1 kg)  Height: 5\' 3"  (1.6 m)   Body mass index is 36.35 kg/m. Wt Readings from Last 3 Encounters:  01/26/19 205 lb 3.2 oz (93.1 kg)  07/19/18 200 lb (90.7 kg)  05/19/18 195 lb 12.8 oz (88.8 kg)    Physical Exam Constitutional:      General: She is not in acute distress.    Appearance: She is well-developed. She is not diaphoretic.  HENT:     Head: Normocephalic and atraumatic.  Eyes:     Conjunctiva/sclera: Conjunctivae normal.     Pupils: Pupils are equal, round, and reactive to light.  Neck:     Musculoskeletal: Normal range  of motion and neck supple.  Cardiovascular:     Rate and Rhythm: Normal rate and regular rhythm.     Pulses: Normal pulses.     Heart sounds: Normal heart sounds.  Pulmonary:     Effort: Pulmonary effort is normal.     Breath sounds: Normal breath sounds.  Abdominal:     General: Bowel sounds are normal.     Palpations: Abdomen is soft.  Musculoskeletal:        General: No tenderness.  Skin:    General: Skin is warm and dry.  Neurological:     Mental Status: She is alert and oriented to person, place, and time.     Labs reviewed: Basic Metabolic Panel: Recent Labs    04/21/18 0918 06/17/18 0825  NA 142  --   K 3.8  --   CL 106  --   CO2 27  --   GLUCOSE 76  --   BUN 14  --   CREATININE 0.70  --   CALCIUM 9.0  --   TSH 0.10* 1.51   Liver Function Tests: Recent Labs    04/21/18 0918  AST 17  ALT 16  BILITOT 0.3  PROT 6.7   No results for input(s): LIPASE, AMYLASE in the last 8760 hours. No results for input(s): AMMONIA in the last 8760 hours. CBC: Recent Labs    04/22/18 0953  WBC 7.5  NEUTROABS 4,463  HGB 13.3  HCT 39.8  MCV 84.5  PLT 280   Lipid Panel: Recent Labs    04/21/18 0918  CHOL 148  HDL 52  LDLCALC 75  TRIG 124  CHOLHDL 2.8   TSH: Recent Labs    04/21/18 0918 06/17/18 0825  TSH 0.10* 1.51   A1C: Lab Results  Component Value Date   HGBA1C 6.1 (H) 04/21/2018     Assessment/Plan 1. Essential hypertension -stable on review of home bp. Will continue regimen at this time. - CBC with Differential/Platelets - losartan (COZAAR) 100 MG tablet; Take 1 tablet (100 mg total) by mouth daily.  Dispense: 90 tablet; Refill: 1 - AMB Referral to [facility]  2. COPD with asthma ([place]) -recent exacerbation treated with prednisone. Plans to change betablocker and increase PPI to hopefully help with symptom management. - AMB Referral to [facility] - ipratropium-albuterol (DUONEB) 0.5-2.5 (3) MG/3ML SOLN; Take 3 mLs by  nebulization every 6 (six) hours as needed.  Dispense: 360 mL; Refill: 2  3. Hypothyroidism due to acquired atrophy of thyroid -follow up TSH, continues on synthroid  - AMB Referral to [facility]  4. Hyperlipidemia LDL goal <100 -continues on lipitor 80 mg daily  - Lipid Panel - AMB Referral to [place] [facility] [facility]  5. Type 2 diabetes mellitus with diabetic neuropathy, without long-term current use of insulin (H[facility]) -blood sugars elevated since starting on prednisone.  Continues on glipizide - Hemoglobin A1c - AMB Referral to [facility]  6. Bilateral leg cramps -improved after stopping norvasc - TSH - COMPLETE METABOLIC PANEL WITH GFR - AMB Referral to [facility] [place] [facility]  7. OSA on CPAP New OSA diagnosis and CPAP- trying to get use to the machine but feels like this will benefit headaches.  - AMB Referral to [facility]  8. Insomnia, unspecified type Ongoing, discussed use of melatonin and how decrease doses can be more effective.  - AMB Referral to [facility]  9. Anxiety - LORazepam (ATIVAN) 0.5 MG tablet; TAKE 1 TABLET BY MOUTH 3 TIMES A DAY AS NEEDED FOR ANXIETY OR NERVOUSNESS  Dispense: 90 tablet; Refill: 2  10. Class 2 severe obesity due to excess calories with serious comorbidity and body mass index (BMI) of 37.0 to 37.9 in adult [facility]) Noted, weight loss with diet and increase in physical activity recommended   11. Need for immunization against influenza - Flu Vaccine QUAD 6+ mos PF IM (Fluarix Quad PF)  12. financial difficulties.  Pt reports she is unable to afford medical care/studies that have been recommended. She is not going to follow up appts as well. [facility] has been consulted to see if they can offer any assistance at this time.   Next appt: 6 weeks for follow up CPE with PAP Keyli Duross K. [place], [facility] [phone]

## 2019-01-27 ENCOUNTER — Telehealth: Payer: Self-pay | Admitting: Internal Medicine

## 2019-01-27 ENCOUNTER — Other Ambulatory Visit: Payer: Self-pay

## 2019-01-27 ENCOUNTER — Other Ambulatory Visit: Payer: Self-pay | Admitting: Nurse Practitioner

## 2019-01-27 DIAGNOSIS — D649 Anemia, unspecified: Secondary | ICD-10-CM

## 2019-01-27 DIAGNOSIS — J449 Chronic obstructive pulmonary disease, unspecified: Secondary | ICD-10-CM

## 2019-01-27 DIAGNOSIS — E034 Atrophy of thyroid (acquired): Secondary | ICD-10-CM

## 2019-01-27 DIAGNOSIS — I10 Essential (primary) hypertension: Secondary | ICD-10-CM

## 2019-01-27 LAB — HEMOGLOBIN A1C
Hgb A1c MFr Bld: 6.3 % of total Hgb — ABNORMAL HIGH (ref <5.7)
Mean Plasma Glucose: 134 (calc)
eAG (mmol/L): 7.4 (calc)

## 2019-01-27 LAB — COMPLETE METABOLIC PANEL WITH GFR
AG Ratio: 1.5 (calc) (ref 1.0–2.5)
ALT: 13 U/L (ref 6–29)
AST: 15 U/L (ref 10–35)
Albumin: 4 g/dL (ref 3.6–5.1)
Alkaline phosphatase (APISO): 74 U/L (ref 37–153)
BUN: 23 mg/dL (ref 7–25)
CO2: 27 mmol/L (ref 20–32)
Calcium: 9.3 mg/dL (ref 8.6–10.4)
Chloride: 105 mmol/L (ref 98–110)
Creat: 0.81 mg/dL (ref 0.50–0.99)
GFR, Est African American: 90 mL/min/{1.73_m2} (ref >=60)
GFR, Est Non African American: 77 mL/min/{1.73_m2} (ref >=60)
Globulin: 2.6 g/dL (calc) (ref 1.9–3.7)
Glucose, Bld: 97 mg/dL (ref 65–99)
Potassium: 4.1 mmol/L (ref 3.5–5.3)
Sodium: 141 mmol/L (ref 135–146)
Total Bilirubin: 0.3 mg/dL (ref 0.2–1.2)
Total Protein: 6.6 g/dL (ref 6.1–8.1)

## 2019-01-27 LAB — CBC WITH DIFFERENTIAL/PLATELET
Absolute Monocytes: 640 cells/uL (ref 200–950)
Basophils Absolute: 24 cells/uL (ref 0–200)
Basophils Relative: 0.3 %
Eosinophils Absolute: 8 cells/uL — ABNORMAL LOW (ref 15–500)
Eosinophils Relative: 0.1 %
HCT: 36.5 % (ref 35.0–45.0)
Hemoglobin: 11.4 g/dL — ABNORMAL LOW (ref 11.7–15.5)
Lymphs Abs: 1152 cells/uL (ref 850–3900)
MCH: 25.2 pg — ABNORMAL LOW (ref 27.0–33.0)
MCHC: 31.2 g/dL — ABNORMAL LOW (ref 32.0–36.0)
MCV: 80.8 fL (ref 80.0–100.0)
MPV: 11 fL (ref 7.5–12.5)
Monocytes Relative: 8 %
Neutro Abs: 6176 cells/uL (ref 1500–7800)
Neutrophils Relative %: 77.2 %
Platelets: 268 10*3/uL (ref 140–400)
RBC: 4.52 10*6/uL (ref 3.80–5.10)
RDW: 14.5 % (ref 11.0–15.0)
Total Lymphocyte: 14.4 %
WBC: 8 10*3/uL (ref 3.8–10.8)

## 2019-01-27 LAB — LIPID PANEL
Cholesterol: 126 mg/dL (ref <200)
HDL: 53 mg/dL (ref >=50)
LDL Cholesterol (Calc): 57 mg/dL (calc)
Non-HDL Cholesterol (Calc): 73 mg/dL (calc) (ref <130)
Total CHOL/HDL Ratio: 2.4 (calc) (ref <5.0)
Triglycerides: 80 mg/dL (ref <150)

## 2019-01-27 LAB — TSH: TSH: 0.14 mIU/L — ABNORMAL LOW (ref 0.40–4.50)

## 2019-01-27 MED ORDER — LEVOTHYROXINE SODIUM 137 MCG PO TABS: 137.0000 ug | ORAL_TABLET | Freq: Every day | ORAL | 0 refills | Status: DC

## 2019-01-27 MED ORDER — LEVOTHYROXINE SODIUM 137 MCG PO TABS: 137.0000 ug | ORAL_TABLET | Freq: Every day | ORAL | 1 refills | Status: DC

## 2019-01-27 NOTE — Addendum Note (Signed)
Addended by: Ruthell Rummage A on: 01/27/2019 04:34 PM   Modules accepted: Orders

## 2019-01-27 NOTE — Telephone Encounter (Signed)
Will await response from Dr. Loanne Drilling.

## 2019-01-27 NOTE — Telephone Encounter (Signed)
Spoke with pt, she would like to switch pulmonary doctors from [place] to Dr. Hale Bogus. Please advise if it is ok to switch.

## 2019-01-27 NOTE — Telephone Encounter (Signed)
Patient requested new prescription for her Duoneb. States during yesterday's office visit she discussed medication with provider and remember she got it filled in the past at [facility] on [address]. Routing to provider for approval.

## 2019-01-27 NOTE — Telephone Encounter (Signed)
Fine with me

## 2019-01-28 MED ORDER — IPRATROPIUM-ALBUTEROL 0.5-2.5 (3) MG/3ML IN SOLN: 3.0000 mL | Freq: Four times a day (QID) | RESPIRATORY_TRACT | 2 refills | Status: DC | PRN

## 2019-01-28 NOTE — Telephone Encounter (Signed)
LMTCB x1 for pt.  When pt calls back she needs to be schedule for a 30 minute OV with Dr. Loanne Drilling.

## 2019-01-28 NOTE — Telephone Encounter (Signed)
Ok with me 

## 2019-01-28 NOTE — Addendum Note (Signed)
Addended by: Ruthell Rummage A on: 01/28/2019 11:18 AM   Modules accepted: Orders

## 2019-01-30 ENCOUNTER — Other Ambulatory Visit: Payer: Self-pay | Admitting: Internal Medicine

## 2019-02-01 NOTE — Telephone Encounter (Signed)
Pt aware of her appt with Dr. Loanne Drilling.

## 2019-02-01 NOTE — Telephone Encounter (Signed)
ATC pt, line went to voicemail. LMTCB x2.  As stated before, pt will need to be scheduled for a 30 minute OV with Dr. Loanne Drilling.

## 2019-02-02 ENCOUNTER — Other Ambulatory Visit: Payer: Self-pay | Admitting: *Deleted

## 2019-02-02 NOTE — Patient Outreach (Signed)
[facility]) Care Management  02/02/2019  Grace [name] July 19, 1955 [id number]    Telephone Assessment-Unsuccessful  RN attempted outreach call to pt however unsuccessful. RN able to leave a HIPAA approved voice message requesting a call back.   PLAN: Will follow up with another outreach call within 4 business days.  [name], RN Care Management Coordinator [place] Office [phone]

## 2019-02-03 ENCOUNTER — Encounter: Payer: Self-pay | Admitting: Nurse Practitioner

## 2019-02-03 ENCOUNTER — Other Ambulatory Visit: Payer: Self-pay

## 2019-02-03 DIAGNOSIS — E1169 Type 2 diabetes mellitus with other specified complication: Secondary | ICD-10-CM

## 2019-02-03 DIAGNOSIS — E785 Hyperlipidemia, unspecified: Secondary | ICD-10-CM

## 2019-02-03 MED ORDER — ATORVASTATIN CALCIUM 80 MG PO TABS: ORAL_TABLET | ORAL | 1 refills | Status: DC

## 2019-02-04 MED ORDER — LEVOTHYROXINE SODIUM 137 MCG PO TABS: 137.0000 ug | ORAL_TABLET | Freq: Every day | ORAL | 1 refills | Status: DC

## 2019-02-04 NOTE — Telephone Encounter (Signed)
When viewing patients chart it appears one of the medical assistant approved rx this month yet it was set to No Print vs normal (sent electronically).  I have corrected this error and sent rx electronically  Message sent back to patient informing her rx sent as requested

## 2019-02-07 ENCOUNTER — Encounter: Payer: Self-pay | Admitting: Pulmonary Disease

## 2019-02-07 ENCOUNTER — Other Ambulatory Visit: Payer: Self-pay | Admitting: *Deleted

## 2019-02-07 ENCOUNTER — Ambulatory Visit (INDEPENDENT_AMBULATORY_CARE_PROVIDER_SITE_OTHER): Payer: Medicare Other | Admitting: Pulmonary Disease

## 2019-02-07 ENCOUNTER — Other Ambulatory Visit: Payer: Self-pay

## 2019-02-07 VITALS — BP 140/84 | HR 96 | Temp 97.2°F | Ht 63.0 in | Wt 211.6 lb

## 2019-02-07 DIAGNOSIS — J441 Chronic obstructive pulmonary disease with (acute) exacerbation: Secondary | ICD-10-CM

## 2019-02-07 DIAGNOSIS — Z87891 Personal history of nicotine dependence: Secondary | ICD-10-CM

## 2019-02-07 DIAGNOSIS — J449 Chronic obstructive pulmonary disease, unspecified: Secondary | ICD-10-CM

## 2019-02-07 DIAGNOSIS — Z9989 Dependence on other enabling machines and devices: Secondary | ICD-10-CM | POA: Diagnosis not present

## 2019-02-07 DIAGNOSIS — G4733 Obstructive sleep apnea (adult) (pediatric): Secondary | ICD-10-CM

## 2019-02-07 MED ORDER — BUDESONIDE-FORMOTEROL FUMARATE 160-4.5 MCG/ACT IN AERO: INHALATION_SPRAY | RESPIRATORY_TRACT | 3 refills | Status: DC

## 2019-02-07 MED ORDER — IPRATROPIUM-ALBUTEROL 0.5-2.5 (3) MG/3ML IN SOLN: 3.0000 mL | Freq: Four times a day (QID) | RESPIRATORY_TRACT | 6 refills | Status: AC | PRN

## 2019-02-07 MED ORDER — PREDNISONE 20 MG PO TABS: ORAL_TABLET | ORAL | 0 refills | Status: AC

## 2019-02-07 MED ORDER — IPRATROPIUM-ALBUTEROL 0.5-2.5 (3) MG/3ML IN SOLN: 3.0000 mL | Freq: Four times a day (QID) | RESPIRATORY_TRACT | 6 refills | Status: DC | PRN

## 2019-02-07 NOTE — Progress Notes (Signed)
Subjective:   PATIENT ID: Grace [name] GENDER: female DOB: 07-Aug-1955, MRN: [id number]   HPI  Chief Complaint  Patient presents with  . Follow-up    shortness of breath     Reason for Visit: Follow-up. Former patient of Dr. Melvyn Novas.  Ms. Grace [name] is a 63 year old female with very severe COPD/asthma who presents for follow-up.  She reports shortness of breath with exertion that has gradually worsened in the last two weeks. She occasionally has unproductive cough and rarely wheezes. Dyspnea is her main symptom which is worsened with activity. She is compliant with her Symbicort and Spiriva. She uses her rescue inhaler at least 4 times daily for daily shortness of breath which improves her symptoms. She uses Duonebs at least once a day but she is self-limiting herself. She wears a CPAP nightly for moderate to severe OSA. Denies hospitalizations/ED visits related to COPD. Last steroid taper October 2020. Reports congestion. Denies chest pain/tightness, palpitations, productive cough.  Social History: She lives with 7 indoor cats. Wears a mask when changing litter.  I have personally reviewed patient's past medical/family/social history, allergies, current medications.  Past Medical History:  Diagnosis Date  . Abnormal vaginal bleeding   . Asthma   . Barrett's esophagus    with high grade dysplasia per endoscopy 05/2005 // followed by Dr. [name] ([facility])  . Chronic back pain   . COPD (chronic obstructive pulmonary disease) ([place])   . Diabetes mellitus without complication ([place])   . Duodenal ulcer disease    thought to be contributed at least partly by overuse of NSAIDs and salicylates (goody powdr)  . Endometrial polyp 03/2003   s/p resection in 03/2003. Path showing submucosal leiomyoma and benign  proliferative type endometrium  . Fibroid uterus    s/p myomectomy x 2  . GERD (gastroesophageal reflux disease)   . H/O failed moderate sedation   . Hiatal hernia   .  History of CVA (cerebrovascular accident) 10/2000  . 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 Hyperlipidemia   . Hypertension   . Hypothyroidism    with hx of multinodular goiter (noted on neck US in 11/2002 - showing diffuse nodularity and inhomogenous texture diffusely BL)  . Internal hemorrhoids    grade 2 per colonoscopy in 09/2006 - repeat colonoscopy rec in 5-10 years.  . Migraine   . Nevus   . Personal history of failed conscious sedation 08/11/2011  . Tachycardia   . Thyroid disease   . Tobacco abuse Jan 2014   quit     Family History  Problem Relation Age of Onset  . COPD Mother   . Heart disease Father 24       had 5 heart attacks, first in late 23s  . COPD Father      Social History   Occupational History  . Occupation: Electronic record    Comment: [employer]  Tobacco Use  . Smoking status: Former Smoker    Packs/day: 3.00    Years: 50.00    Pack years: 150.00    Types: Cigarettes    Quit date: 05/10/2015    Years since quitting: 3.7  . Smokeless tobacco: Never Used  . Tobacco comment: Counseling sheet 07-2011   Substance and Sexual Activity  . Alcohol use: No    Alcohol/week: 0.0 standard drinks  . Drug use:  No  . Sexual activity: Never    Allergies  Allergen Reactions  . Amlodipine     Cramps   . Anoro Ellipta [Umeclidinium-Vilanterol] Other (See Comments)    Chest pain if used more than 3 consecutive days   . Codeine     REACTION: headaches  . Gabapentin     headache  . Prozac [Fluoxetine Hcl] Other (See Comments)    Suicidal ideations     Outpatient Medications Prior to Visit  Medication Sig Dispense Refill  . albuterol (PROAIR HFA) 108 (90 Base) MCG/ACT inhaler INHALE 2 PUFFS EVERY 6  HOURS AS NEEDED FOR  SHORTNESS OF BREATH OR  WHEEZING 34 g 3  . alum & mag hydroxide-simeth (MAALOX/MYLANTA) 200-200-20 MG/5ML suspension Take by mouth every 6 (six)  hours as needed for indigestion or heartburn.    Marland Kitchen aspirin EC 81 MG tablet Take 81 mg by mouth daily.    . Aspirin-Acetaminophen-Caffeine [id number] MG PACK Take 1 Package by mouth as needed.     Marland Kitchen atorvastatin (LIPITOR) 80 MG tablet TAKE ONE TABLET BY MOUTH  ONCE DAILY AT BEDTIME FOR  CHOLESTEROL 90 tablet 1  . Biotin 5000 MCG TABS Take by mouth daily.    . bisoprolol (ZEBETA) 5 MG tablet One twice daily 60 tablet 11  . budesonide-formoterol (SYMBICORT) 160-4.5 MCG/ACT inhaler 2 puffs twice daily. 3 Inhaler 3  . glipiZIDE (GLUCOTROL XL) 5 MG 24 hr tablet TAKE 1 TABLET BY MOUTH  DAILY WITH BREAKFAST 90 tablet 3  . glucose blood (ONETOUCH VERIO) test strip Use to check blood sugar twice daily. Dx: E11.9 300 each 1  . hydrALAZINE (APRESOLINE) 50 MG tablet Take 1 tablet (50 mg total) by mouth 3 (three) times daily. 270 tablet 1  . ibuprofen (ADVIL,MOTRIN) 200 MG tablet Take 800 mg by mouth as needed for moderate pain.     Marland Kitchen ipratropium-albuterol (DUONEB) 0.5-2.5 (3) MG/3ML SOLN Take 3 mLs by nebulization every 6 (six) hours as needed. 360 mL 2  . levothyroxine (SYNTHROID) 137 MCG tablet Take 1 tablet (137 mcg total) by mouth daily before breakfast. 90 tablet 1  . LORazepam (ATIVAN) 0.5 MG tablet TAKE 1 TABLET BY MOUTH 3 TIMES A DAY AS NEEDED FOR ANXIETY OR NERVOUSNESS 90 tablet 2  . losartan (COZAAR) 100 MG tablet Take 1 tablet (100 mg total) by mouth daily. 90 tablet 1  . Melatonin 10 MG TABS Take 1 tablet by mouth at bedtime.    . Multiple Vitamins-Minerals (HAIR/SKIN/NAILS) TABS Take by mouth daily.    Marland Kitchen omeprazole (PRILOSEC) 20 MG capsule Take 30- 60 min before your first and last meals of the day 180 capsule 3  . OneTouch Delica Lancets 99991111 MISC Use to check blood sugar twice daily. Dx: E11.9 300 each 1  . SPIRIVA RESPIMAT 2.5 MCG/ACT AERS USE 2 PUFFS BY MOUTH ONCE  DAILY 12 g 3  . Valerian Root 500 MG CAPS Take 2 capsules by mouth at bedtime.    Marland Kitchen albuterol (PROVENTIL) (2.5 MG/3ML) 0.083%  nebulizer solution Take 3 mLs (2.5 mg total) by nebulization every 4 (four) hours as needed for wheezing or shortness of breath. 75 mL 12  . predniSONE (DELTASONE) 10 MG tablet Take  4 each am x 2 days,   2 each am x 2 days,  1 each am x 2 days and stop 14 tablet 0   No facility-administered medications prior to visit.     Review of Systems  Constitutional: Negative for chills, diaphoresis, fever,  malaise/fatigue and weight loss.  HENT: Positive for congestion. Negative for ear pain and sore throat.   Respiratory: Positive for cough, shortness of breath and wheezing. Negative for hemoptysis and sputum production.   Cardiovascular: Negative for chest pain, palpitations and leg swelling.  Gastrointestinal: Negative for abdominal pain, heartburn and nausea.  Genitourinary: Negative for frequency.  Musculoskeletal: Negative for joint pain and myalgias.  Skin: Negative for itching and rash.  Neurological: Negative for dizziness, weakness and headaches.  Endo/Heme/Allergies: Does not bruise/bleed easily.  Psychiatric/Behavioral: Negative for depression. The patient is not nervous/anxious.      Objective:   Vitals:   02/07/19 1456  BP: 140/84  Pulse: 96  Temp: (!) 97.2 F (36.2 C)  TempSrc: Temporal  SpO2: 95%  Weight: 211 lb 9.6 oz (96 kg)  Height: 5\' 3"  (1.6 m)   SpO2: 95 % O2 Device: None (Room air)  Physical Exam: General: Well-appearing, no acute distress HENT: [place], AT Eyes: EOMI, no scleral icterus Respiratory: Clear to auscultation bilaterally.  No crackles, wheezing or rales Cardiovascular: RRR, -M/R/G, no JVD GI: BS+, soft, nontender Extremities:-Edema,-tenderness Neuro: AAO x4, CNII-XII grossly intact Skin: Intact, no rashes or bruising Psych: Normal mood, normal affect  Data Reviewed:  Imaging: CXR 09/26/15 - Bibasilar atelectasis, R IJ in place  PFT: 04/26/2018 FVC 1.88 (60%) FEV1 0.87 (35%) Ratio 45  TLC 114% DLCO 73% Interpretation: Very severe obstructive  defect with air trapping and reduced DLCO consistent with emphysema. Significant bronchodilator response  Labs: CBC    Component Value Date/Time   WBC 10.3 02/07/2019 1548   RBC 4.43 02/07/2019 1548   HGB 11.3 (L) 02/07/2019 1548   HGB 12.1 10/08/2015 1407   HCT 36.3 02/07/2019 1548   HCT 38.1 10/08/2015 1407   PLT 243.0 02/07/2019 1548   PLT 633 (H) 10/08/2015 1407   MCV 82.1 02/07/2019 1548   MCV 90 10/08/2015 1407   MCH 25.2 (L) 01/26/2019 0925   MCHC 31.1 02/07/2019 1548   RDW 16.9 (H) 02/07/2019 1548   RDW 14.8 10/08/2015 1407   LYMPHSABS 1.8 02/07/2019 1548   LYMPHSABS 1.7 10/08/2015 1407   MONOABS 0.7 02/07/2019 1548   EOSABS 0.4 02/07/2019 1548   EOSABS 0.2 10/08/2015 1407   BASOSABS 0.1 02/07/2019 1548   BASOSABS 0.0 10/08/2015 1407   BMET    Component Value Date/Time   NA 141 01/26/2019 0925   NA 134 (A) 10/19/2015   K 4.1 01/26/2019 0925   CL 105 01/26/2019 0925   CO2 27 01/26/2019 0925   GLUCOSE 97 01/26/2019 0925   BUN 23 01/26/2019 0925   BUN 14 10/19/2015   CREATININE 0.81 01/26/2019 0925   CALCIUM 9.3 01/26/2019 0925   GFRNONAA 77 01/26/2019 0925   GFRAA 90 01/26/2019 0925   Imaging, labs and tests noted above have been reviewed independently by me.    Assessment & Plan:   Discussion: 63 year old significant smoker (quit 2017) who presents for COPD. Under investigational online study for CPAP for OSA. We reviewed prior history, medications, inhaler regimen, imaging, last PFT (04/26/18) and plan as noted below. Counseled patient against long-term use of steroids as this is associated with adverse outcomes including bone health, endocrine issues etc. Will also treat for COPD exacerbation.  COPD GOLD D Category COPD Exacerbation --START Prednisone taper for exacerbation --CONTINUE Symbicort 160-4.5 mcg 2 puffs TWICE a day. Refilled prescription today --CONTINUE Spiriva 2 puffs ONCE a day --CONTINUE Duonebs (1st choice) or Albuterol (2nd choice) as  needed  every 6 hours for shortness of breath or wheezing. Refilled prescription today --Refer to lung nodule clinic --Obtain CBC with diff and IgE for asthmatic component  ?OSA --Patient participating in online study --Requested patient to send it to me to review --Consider overnight oximetry if indicated  Health Maintenance Immunization History  Administered Date(s) Administered  . Influenza Split 04/25/2011  . Influenza Whole 02/06/2009  . Influenza, Seasonal, Injecte, Preservative Fre 05/20/2012  . Influenza,inj,Quad PF,6+ Mos 03/15/2013, 01/12/2015, 01/25/2016, 12/23/2016, 01/22/2018, 01/26/2019  . Influenza-Unspecified 12/13/2013  . Pneumococcal Polysaccharide-23 05/20/2012  . Tdap 06/12/2011  . Zoster 12/16/2015   CT Lung Screen - will refer to lung nodule clinic  Orders Placed This Encounter  Procedures  . CBC with Differential/Platelet    Standing Status:   Future    Number of Occurrences:   1    Standing Expiration Date:   02/07/2020  . IgE    Standing Status:   Future    Number of Occurrences:   1    Standing Expiration Date:   02/07/2020  . Ambulatory Referral for Lung Cancer Scre    Referral Priority:   Routine    Referral Type:   Consultation    Referral Reason:   Specialty Services Required    Number of Visits Requested:   1   Meds ordered this encounter  Medications  . budesonide-formoterol (SYMBICORT) 160-4.5 MCG/ACT inhaler    Sig: 2 puffs twice daily.    Dispense:  3 Inhaler    Refill:  3    Order Specific Question:   Lot Number?    [id number]    Order Specific Question:   Expiration Date?    Answer:   04/27/2019    Order Specific Question:   Manufacturer?    Answer:   Astra[employer]eneca [[id number]]    Order Specific Question:   Quantity    Answer:   1  . ipratropium-albuterol (DUONEB) 0.5-2.5 (3) MG/3ML SOLN    Sig: Take 3 mLs by nebulization every 6 (six) hours as needed.    Dispense:  360 mL    Refill:  6  . predniSONE (DELTASONE) 20 MG tablet     Sig: Take 2 tablets (40 mg total) by mouth daily with breakfast for 3 days, THEN 1.5 tablets (30 mg total) daily with breakfast for 3 days, THEN 1 tablet (20 mg total) daily with breakfast for 3 days.    Dispense:  14 tablet    Refill:  0    Return in about 3 months (around 05/10/2019).  Zenith Kercheval Rodman Pickle, MD [facility] 02/07/2019 3:00 PM  Office Number [phone]  Addendum: Reviewed labs. Eos demonstrate eos 400. Will increase ICS strength. Sent mychart message:     START ADVAIR HFA 237mcg TWO PUFFS TWICE A DAY. This inhaler will replace your Symbicort.      CONTINUE Spiriva as directed      CONTINUE your rescue inhaler and nebulizer as directed.  Rodman Pickle, M.D. [facility] 02/09/2019 12:57 PM

## 2019-02-07 NOTE — Patient Outreach (Signed)
[facility]) Care Management  02/07/2019  Grace [name] Jan 10, 1956 [id number]    RN researched possible assistance program with both the [place]. [place] indicated no funding available however can bill the insurance carrier [facility]) and what's not paid or covered will be billed to the pt.  Cancer Center message left with [name], Currently awaiting a call back.   Plan to update pt on available options for her financial hardship once RN has spoken with the [place].  [name], RN Care Management Coordinator [place] Office [phone]

## 2019-02-07 NOTE — Patient Outreach (Signed)
[facility]) Care Management  02/07/2019  Grace [name] [birth year] [id number]    Telephone Assessment-Unsuccessful  I have attempted several times to reach this pt concerning the request for assistance with the requested mammogram and CADs work up. Two centers call with inquires on available assistance program however unsuccessful. Pt has called back last week and indicated she did not wish to have this referral forwarded to [facility]. RN spoke with last involved social worker via [facility] who indicated pt was seeking this service before for a repeat mammogram but no programs were available at that time.   RN call [place] with no successful. Will make a referral for [facility] social worker to research available sources with other social workers at the cancer center that may be aware of other options for this pt. Will continue outreach attempts to his pt for pending services that have be replaced.  [name], RN Care Management Coordinator [place] Office [phone]

## 2019-02-07 NOTE — Patient Instructions (Addendum)
COPD GOLD D Category COPD Exacerbation --START Prednisone taper for exacerbation --CONTINUE Symbicort 160-4.5 mcg 2 puffs TWICE a day. Refilled prescription today --CONTINUE Spiriva 2 puffs ONCE a day --CONTINUE Duonebs (1st choice) or Albuterol (2nd choice) as needed every 6 hours for shortness of breath or wheezing. Refilled prescription today --Refer to lung nodule clinic  Suspected OSA --Patient participating in online study --Requested patient to send it to me to review --Consider overnight oximetry if indicated  Follow-up with me in 3 months

## 2019-02-08 ENCOUNTER — Encounter: Payer: Self-pay | Admitting: Nurse Practitioner

## 2019-02-08 ENCOUNTER — Other Ambulatory Visit: Payer: Self-pay | Admitting: *Deleted

## 2019-02-08 ENCOUNTER — Ambulatory Visit: Payer: Self-pay | Admitting: *Deleted

## 2019-02-08 LAB — CBC WITH DIFFERENTIAL/PLATELET
Basophils Absolute: 0.1 10*3/uL (ref 0.0–0.1)
Basophils Relative: 0.7 % (ref 0.0–3.0)
Eosinophils Absolute: 0.4 10*3/uL (ref 0.0–0.7)
Eosinophils Relative: 4.2 % (ref 0.0–5.0)
HCT: 36.3 % (ref 36.0–46.0)
Hemoglobin: 11.3 g/dL — ABNORMAL LOW (ref 12.0–15.0)
Lymphocytes Relative: 17.2 % (ref 12.0–46.0)
Lymphs Abs: 1.8 10*3/uL (ref 0.7–4.0)
MCHC: 31.1 g/dL (ref 30.0–36.0)
MCV: 82.1 fl (ref 78.0–100.0)
Monocytes Absolute: 0.7 10*3/uL (ref 0.1–1.0)
Monocytes Relative: 7.1 % (ref 3.0–12.0)
Neutro Abs: 7.3 10*3/uL (ref 1.4–7.7)
Neutrophils Relative %: 70.8 % (ref 43.0–77.0)
Platelets: 243 10*3/uL (ref 150.0–400.0)
RBC: 4.43 Mil/uL (ref 3.87–5.11)
RDW: 16.9 % — ABNORMAL HIGH (ref 11.5–15.5)
WBC: 10.3 10*3/uL (ref 4.0–10.5)

## 2019-02-08 NOTE — Patient Outreach (Signed)
[facility]) Care Management  02/08/2019  LAILAH MAROCCO [birth year] [id number]    Referral received: 01/26/2019 Telephone Assessment-Enrolled   RN spoke with pt today and explained the purpose for today's call. Pt receptive as RN discussed the referral information provided concerning assistance with requested mammogram and CAD work-up. Pt further explained the request indicated the mammogram was for a diagnosis due to some changes in her right breast. Pt again unable to afford this due to no coverage at that time from her insurance carrier. RN inquired further on the timeframe on when this request was made with her insurance carrier. RN encouraged pt to contact [facility] and inquire once again on possible coverage since its been over one years on this request. Pt indicated she would contact [facility] with this request once again.  RN also made a referral to a [facility] social worker to further research possible resources that may offer further assistance. Pt aware she may receive a call from [place] worker with additional information to assist further (pt appreciative and receptive to this referral). The other request for a CAD work up related to a co-pay of $30 due to the request for a test with Dr. Ellyn Hack. RN informed pt that [facility] is not able to assist with any co-pays however encouraged pt to work with the office manager on possible payment plan to accommodate her ongoing financial hardship. Other areas of interest related to pt's COPD. Pt has a pulmonologist (last visit yesterday) due to pt's Stage 4 COPD. Reports several changes with her medications and use of her inhalers. Based upon her recent CT scan pt has indicated there is nothing else that can be done related to the staging of her COPD. Pt states she will be tested for home O2 soon but no definitive date has been set. Pt reports she is on a study that requires her to use a CPAP due to her ongoing apnea. RN educated pt on the use of her  CPAP can be uses with home O2. No problems breathing at this time and pt has verified she is in the GREEN zone.  RN inquired on her A.D as pt states no family but she has a Industrial/product designer and daughter who assist her at times. Pt states she has the packet and will review. RN discussed the purpose of a living will and HCPOA and the progress if there is an emergency.   RN stress the importance of preventive actions related to contacting her insurance carrier on the mammogram, lower her risk and avoid environmental factors that can exacerbate her symptoms with her COPD and work with her CAD provider on possible payment for the requested work-up concerning her ongoing cardiac issues. RN offered to enroll into a [facility] program and work with pt on managing her COPD better with the recent medication changes and awareness of the COPD action plan (pt receptive). Will requested initial assessment as pt agreed to a follow up call at a later date to complete. Plan of care discussed and generated based upon pt's ability to adhere. Will send printable information on COPD and further discuss on a follow up call. Will also alert pt's primary provider on pt's disposition with [facility] services.  THN CM Care Plan Problem One     Most Recent Value  Care Plan Problem One  Deficient Knowledge related to COPD unfamiliarity with information /education  Role Documenting the Problem One  Care Management Coordinator  Care Plan for Problem One  Active  THN Long Term Goal   Pt will verbalize the plan of aciton in the YELLOW zone within the next 60 days.  THN Long Term Goal Start Date  02/08/19  Interventions for Problem One Long Term Goal  Will educate on the COPD action plan zones and what to do if acute symtoms should occur. Will establish a plan of action and reiterate on what to do if acute symptoms are encountered.  THN CM Short Term Goal #1   Pt will keep all her scheduled appointments within the next 30 days.  THN CM Short Term Goal #1  Start Date  02/08/19  Interventions for Short Term Goal #1  Stress the importance of adhering to all scheduled appointments to avoid acute events and/or risk for admission. Will verify pt has sufficient transportaiton services to aovid missing any appointments. Will also offer another source of transportation ([employer]) if needed.  THN CM Short Term Goal #2   Pt will verbalize two symptoms of COPD exacerbation within the next 30 days  THN CM Short Term Goal #2 Start Date  02/08/19  Interventions for Short Term Goal #2  Will educate and discsuss COPD action plan and send out educational material via [employer].      [name], RN Care Management Coordinator [place] Office [phone]

## 2019-02-09 ENCOUNTER — Other Ambulatory Visit: Payer: Self-pay

## 2019-02-09 ENCOUNTER — Telehealth: Payer: Self-pay | Admitting: *Deleted

## 2019-02-09 LAB — IGE: IgE (Immunoglobulin E), Serum: 53 kU/L (ref <=114)

## 2019-02-09 MED ORDER — ADVAIR HFA 230-21 MCG/ACT IN AERO: 2.0000 | INHALATION_SPRAY | Freq: Two times a day (BID) | RESPIRATORY_TRACT | 4 refills | Status: DC

## 2019-02-09 NOTE — Telephone Encounter (Signed)
-----   Message from Enoch, MD sent at 02/09/2019 12:56 PM EDT ----- LR - please call patient with results and order Advair 259mcg 2 puffs BID  Lab tests show elevated eosinophil counts. Eosinophils are infection fighting cells that can be associated with allergic disease. I suspect your COPD and asthma has an allergic component. I would like to change your inhaler type so we can increase the steroid strength.      START ADVAIR HFA 231mcg TWO PUFFS TWICE A DAY. This inhaler will replace your Symbicort.      CONTINUE Spiriva as directed      CONTINUE your rescue inhaler and nebulizer as directed.

## 2019-02-09 NOTE — Telephone Encounter (Signed)
Spoke with patient. Placed order and provided her with Dr. Cordelia Pen recommendations. Nothing further needed at this time.

## 2019-02-09 NOTE — Patient Outreach (Addendum)
[facility] [facility]) Care Management  02/09/2019  OLISA BRITTO [birth year] [id number]   Social work referral received from [employer], [name]. "Refer to social worker-MD has requested assistance for t his pt. I have receive information from previous [facility] CSW who indicated pt was in need several months ago and may qualify for coverage with her carrier not but I have note being able to contact her for an update. Pt needs a repeat mammogram and CADs if requesting a work up that pt can not afford either for financial hardship. I have inquired already with the [place] (nothing available for assistance programs) and [place] ([place]) indicates their CSW may have available programs that can assist. Please call and inquired further for pt information." Successful outreach to patient today.  Prior to outreach, I communicated with Etheleen Sia, Oncology Outreach Manager, to inquire about potential programs to assist with cost of copay for mammograms.  Received the following response:  "We have a couple options. We have the mammogram scholarship for screening mammograms and the BCCCP Program (Breast and cervical cancer control program). Our department oversees both programs. If you have a patient that needs to schedule an appointment or get approved they can contact our scheduler Nino Glow at [phone]. " Provided patient with this information today.  She will call to inquire about assistance.  Discussed applying for Medicaid with patient.  Patient has previously applied and does not qualify due to being over income limit.  Will follow up with her next week regarding assistance through [place]. Will explore other options if they are unable to assist.   Ronn Melena, [place] Worker [phone]

## 2019-02-10 ENCOUNTER — Other Ambulatory Visit: Payer: Self-pay

## 2019-02-11 ENCOUNTER — Other Ambulatory Visit: Payer: Self-pay | Admitting: Nurse Practitioner

## 2019-02-11 DIAGNOSIS — I10 Essential (primary) hypertension: Secondary | ICD-10-CM

## 2019-02-14 ENCOUNTER — Ambulatory Visit: Payer: Self-pay

## 2019-02-14 ENCOUNTER — Other Ambulatory Visit: Payer: Self-pay | Admitting: *Deleted

## 2019-02-14 ENCOUNTER — Other Ambulatory Visit: Payer: Self-pay

## 2019-02-14 DIAGNOSIS — Z87891 Personal history of nicotine dependence: Secondary | ICD-10-CM

## 2019-02-14 DIAGNOSIS — Z122 Encounter for screening for malignant neoplasm of respiratory organs: Secondary | ICD-10-CM

## 2019-02-14 NOTE — Patient Outreach (Signed)
[facility]) Care Management  02/14/2019  Grace [name] [birth year] [id number]   Attempted to contact patient to follow up on resource provided for possible assistance with cost of co pay for mammogram.  Left messages on mobile and home numbers.  Will attempt to reach again within four business days.  Ronn Melena, BSW Social Worker [phone]

## 2019-02-16 ENCOUNTER — Ambulatory Visit: Payer: Self-pay | Admitting: *Deleted

## 2019-02-16 ENCOUNTER — Other Ambulatory Visit: Payer: Self-pay

## 2019-02-16 NOTE — Patient Outreach (Signed)
[facility]) Care Management  02/16/2019  Grace [name] [birth year] [id number]   Received return call from patient in response to message left on 02/14/19.   Asked patient if she has reached out to Nino Glow with [place] regarding possible assistance with co-pay for mammogram.  Patient reported that she forgot about resource being provided until just seeing the paper that she wrote contact information on.  Patient stated that she will contact Nevin Bloodgood this week. Will follow up with her again next week.  Ronn Melena, BSW Social Worker [phone]

## 2019-02-18 ENCOUNTER — Encounter: Payer: Self-pay | Admitting: *Deleted

## 2019-02-18 ENCOUNTER — Ambulatory Visit: Payer: Self-pay

## 2019-02-18 ENCOUNTER — Other Ambulatory Visit: Payer: Self-pay | Admitting: *Deleted

## 2019-02-18 NOTE — Patient Outreach (Addendum)
[facility]) Care Management  02/18/2019  ANAID GUTH 1955-09-10 [id number]    Telephone Assessment-Initial Assessment  RN spoke with pt today as scheduled to completed the initial assessment inquires. Pt receptive as RN competed the assessment and review the plan of care recent initiated. Pt has received the printed information packet but has ye to review all the information enclosed. RN will review and discuss more thoroughly on the next follow up call. Pt has agreed to monthly calls in managing her ongoing COPD.  Discussed the COPD action plan once again verifying pt remains in the GREEN. Pt very excited today with the Prednisone on board and recent change in her medication (reviewd and verified). Pt states she feel "good" today and able to complete more task around the home then usual. Pt very happy and expressed her thoughts freely concerning a good day today. RN supported her with positive feedback on managing her COPD and stress once again the importance. No issues or request at this time as RN will once again follow up accordingly next month for ongoing management of care.  [name], RN Care Management Coordinator [place] Office [phone]

## 2019-02-21 ENCOUNTER — Ambulatory Visit: Payer: Self-pay

## 2019-02-21 ENCOUNTER — Other Ambulatory Visit: Payer: Self-pay

## 2019-02-21 NOTE — Patient Outreach (Signed)
[place] [facility]) Care Management  02/21/2019  Grace [name] 15-Nov-1955 [id number]   Attempted to contact patient today to follow up on resources provided for possible assistance with copay for mammogram.  Left voicemail message. Will attempt to reach again within four business days.  Ronn Melena, BSW Social Worker [phone]

## 2019-02-23 ENCOUNTER — Other Ambulatory Visit: Payer: Self-pay

## 2019-02-23 ENCOUNTER — Ambulatory Visit: Payer: Self-pay

## 2019-02-23 NOTE — Patient Outreach (Signed)
[place]) Care Management  02/23/2019  Grace [name] [birth year] [id number]   Second attempt to contact patient today to follow up on resources provided for possible assistance with copay for mammogram.  Left voicemail message. Mailed Unsuccessful Outreach Letter. Will attempt to reach again within four business days.  Ronn Melena, BSW Social Worker [phone]

## 2019-02-25 ENCOUNTER — Other Ambulatory Visit: Payer: Self-pay

## 2019-02-25 ENCOUNTER — Ambulatory Visit: Payer: Self-pay

## 2019-02-25 ENCOUNTER — Encounter: Payer: Self-pay | Admitting: Family

## 2019-02-25 ENCOUNTER — Ambulatory Visit (INDEPENDENT_AMBULATORY_CARE_PROVIDER_SITE_OTHER): Payer: Medicare Other | Admitting: Family

## 2019-02-25 DIAGNOSIS — Z780 Asymptomatic menopausal state: Secondary | ICD-10-CM

## 2019-02-25 DIAGNOSIS — Z0000 Encounter for general adult medical examination without abnormal findings: Secondary | ICD-10-CM

## 2019-02-25 DIAGNOSIS — E1140 Type 2 diabetes mellitus with diabetic neuropathy, unspecified: Secondary | ICD-10-CM

## 2019-02-25 NOTE — Patient Instructions (Signed)
Grace [name] , Thank you for taking time to come for your Medicare Wellness Visit. I appreciate your ongoing commitment to your health goals. Please review the following plan we discussed and let me know if I can assist you in the future.   Screening recommendations/referrals: Colonoscopy: Up to date  Mammogram : Up to date  Bone Density: Ordered this visit  Recommended yearly ophthalmology/optometry visit for glaucoma screening and checkup Recommended yearly dental visit for hygiene and checkup  Vaccinations: Influenza vaccine : Up to date  Pneumococcal vaccine : Up to date  Tdap vaccine : Up to date due 06/11/2021 Shingles vaccine: Please get new shingles vaccine at your pharmacy    Advanced directives: Yes   Conditions/risks identified: Type 2 DM,Hypertension,dyslipidemia,Obesity BMI> 30,sedentary lifestyle,Hx of smoking.  Next appointment: 1 year   Preventive Care 40-64 Years, Female Preventive care refers to lifestyle choices and visits with your health care provider that can promote health and wellness. What does preventive care include?  A yearly physical exam. This is also called an annual well check.  Dental exams once or twice a year.  Routine eye exams. Ask your health care provider how often you should have your eyes checked.  Personal lifestyle choices, including:  Daily care of your teeth and gums.  Regular physical activity.  Eating a healthy diet.  Avoiding tobacco and drug use.  Limiting alcohol use.  Practicing safe sex.  Taking low-dose aspirin daily starting at age 104.  Taking vitamin and mineral supplements as recommended by your health care provider. What happens during an annual well check? The services and screenings done by your health care provider during your annual well check will depend on your age, overall health, lifestyle risk factors, and family history of disease. Counseling  Your health care provider may ask you questions about your:   Alcohol use.  Tobacco use.  Drug use.  Emotional well-being.  Home and relationship well-being.  Sexual activity.  Eating habits.  Work and work Statistician.  Method of birth control.  Menstrual cycle.  Pregnancy history. Screening  You may have the following tests or measurements:  Height, weight, and BMI.  Blood pressure.  Lipid and cholesterol levels. These may be checked every 5 years, or more frequently if you are over 58 years old.  Skin check.  Lung cancer screening. You may have this screening every year starting at age 60 if you have a 30-pack-year history of smoking and currently smoke or have quit within the past 15 years.  Fecal occult blood test (FOBT) of the stool. You may have this test every year starting at age 27.  Flexible sigmoidoscopy or colonoscopy. You may have a sigmoidoscopy every 5 years or a colonoscopy every 10 years starting at age 68.  Hepatitis C blood test.  Hepatitis B blood test.  Sexually transmitted disease (STD) testing.  Diabetes screening. This is done by checking your blood sugar (glucose) after you have not eaten for a while (fasting). You may have this done every 1-3 years.  Mammogram. This may be done every 1-2 years. Talk to your health care provider about when you should start having regular mammograms. This may depend on whether you have a family history of breast cancer.  BRCA-related cancer screening. This may be done if you have a family history of breast, ovarian, tubal, or peritoneal cancers.  Pelvic exam and Pap test. This may be done every 3 years starting at age 38. Starting at age 15, this may be  done every 5 years if you have a Pap test in combination with an HPV test.  Bone density scan. This is done to screen for osteoporosis. You may have this scan if you are at high risk for osteoporosis. Discuss your test results, treatment options, and if necessary, the need for more tests with your health care  provider. Vaccines  Your health care provider may recommend certain vaccines, such as:  Influenza vaccine. This is recommended every year.  Tetanus, diphtheria, and acellular pertussis (Tdap, Td) vaccine. You may need a Td booster every 10 years.  Zoster vaccine. You may need this after age 5.  Pneumococcal 13-valent conjugate (PCV13) vaccine. You may need this if you have certain conditions and were not previously vaccinated.  Pneumococcal polysaccharide (PPSV23) vaccine. You may need one or two doses if you smoke cigarettes or if you have certain conditions. Talk to your health care provider about which screenings and vaccines you need and how often you need them. This information is not intended to replace advice given to you by your health care provider. Make sure you discuss any questions you have with your health care provider. Document Released: 04/27/2015 Document Revised: 12/19/2015 Document Reviewed: 01/30/2015 Elsevier Interactive Patient Education  2017 [place] Prevention in the Home Falls can cause injuries. They can happen to people of all ages. There are many things you can do to make your home safe and to help prevent falls. What can I do on the outside of my home?  Regularly fix the edges of walkways and driveways and fix any cracks.  Remove anything that might make you trip as you walk through a door, such as a raised step or threshold.  Trim any bushes or trees on the path to your home.  Use bright outdoor lighting.  Clear any walking paths of anything that might make someone trip, such as rocks or tools.  Regularly check to see if handrails are loose or broken. Make sure that both sides of any steps have handrails.  Any raised decks and porches should have guardrails on the edges.  Have any leaves, snow, or ice cleared regularly.  Use sand or salt on walking paths during winter.  Clean up any spills in your garage right away. This includes  oil or grease spills. What can I do in the bathroom?  Use night lights.  Install grab bars by the toilet and in the tub and shower. Do not use towel bars as grab bars.  Use non-skid mats or decals in the tub or shower.  If you need to sit down in the shower, use a plastic, non-slip stool.  Keep the floor dry. Clean up any water that spills on the floor as soon as it happens.  Remove soap buildup in the tub or shower regularly.  Attach bath mats securely with double-sided non-slip rug tape.  Do not have throw rugs and other things on the floor that can make you trip. What can I do in the bedroom?  Use night lights.  Make sure that you have a light by your bed that is easy to reach.  Do not use any sheets or blankets that are too big for your bed. They should not hang down onto the floor.  Have a firm chair that has side arms. You can use this for support while you get dressed.  Do not have throw rugs and other things on the floor that can make you trip. What  can I do in the kitchen?  Clean up any spills right away.  Avoid walking on wet floors.  Keep items that you use a lot in easy-to-reach places.  If you need to reach something above you, use a strong step stool that has a grab bar.  Keep electrical cords out of the way.  Do not use floor polish or wax that makes floors slippery. If you must use wax, use non-skid floor wax.  Do not have throw rugs and other things on the floor that can make you trip. What can I do with my stairs?  Do not leave any items on the stairs.  Make sure that there are handrails on both sides of the stairs and use them. Fix handrails that are broken or loose. Make sure that handrails are as long as the stairways.  Check any carpeting to make sure that it is firmly attached to the stairs. Fix any carpet that is loose or worn.  Avoid having throw rugs at the top or bottom of the stairs. If you do have throw rugs, attach them to the floor  with carpet tape.  Make sure that you have a light switch at the top of the stairs and the bottom of the stairs. If you do not have them, ask someone to add them for you. What else can I do to help prevent falls?  Wear shoes that:  Do not have high heels.  Have rubber bottoms.  Are comfortable and fit you well.  Are closed at the toe. Do not wear sandals.  If you use a stepladder:  Make sure that it is fully opened. Do not climb a closed stepladder.  Make sure that both sides of the stepladder are locked into place.  Ask someone to hold it for you, if possible.  Clearly mark and make sure that you can see:  Any grab bars or handrails.  First and last steps.  Where the edge of each step is.  Use tools that help you move around (mobility aids) if they are needed. These include:  Canes.  Walkers.  Scooters.  Crutches.  Turn on the lights when you go into a dark area. Replace any light bulbs as soon as they burn out.  Set up your furniture so you have a clear path. Avoid moving your furniture around.  If any of your floors are uneven, fix them.  If there are any pets around you, be aware of where they are.  Review your medicines with your doctor. Some medicines can make you feel dizzy. This can increase your chance of falling. Ask your doctor what other things that you can do to help prevent falls. This information is not intended to replace advice given to you by your health care provider. Make sure you discuss any questions you have with your health care provider. Document Released: 01/25/2009 Document Revised: 09/06/2015 Document Reviewed: 05/05/2014 Elsevier Interactive Patient Education  2017 [employer].

## 2019-02-25 NOTE — Patient Outreach (Signed)
[facility]) Care Management  02/25/2019  Grace [name] 1955-07-12 [id number]   Successful follow up call to patient today.   Patient did reach out to [facility] regarding assistance with co-pay for mammogram.   Per patient, they are only able to assist those that are not insured or under insured.   Patient stated that she was appreciative of attempt to assist and that several other professionals have attempted to locate resource without success.  Patient reported that MD is also recommending CT Scan, Podiatry appointment, and pap smear.  Patient expressed frustration about all these "tests" being recommended.   She stated, "I haven't had a pap smear in years, why would I need one now?" We did talk about the importance of following through on these recommendations, however, patient states that she cannot afford all of the co-pays. Encouraged patient to inquire about payment plans and seriously consider this as an option.  Social work case being closed due to potential resources being exhausted.  [facility] services cannot assist with cost of co-pays.    Grace [name], BSW Social Worker [phone]

## 2019-02-25 NOTE — Progress Notes (Signed)
This service is provided via telemedicine  No vital signs collected/recorded due to the encounter was a telemedicine visit.   Location of patient (ex: home, work):  Home   Patient consents to a telephone visit:  Yes  Location of the provider (ex: office, home):  Office  Name of any referring provider:  N/A  Names of all persons participating in the telemedicine service and their role in the encounter: Marisa Cyphers RMA, Webb Silversmith Ngetich NP   Grace [name] patient  Time spent on call:  10 min.

## 2019-02-25 NOTE — Progress Notes (Signed)
Subjective:   Grace [name] is a 63 y.o. female who presents for Medicare Annual (Subsequent) preventive examination.  Review of Systems:  Cardiac Risk Factors include: diabetes mellitus;hypertension;dyslipidemia;obesity (BMI >30kg/m2);sedentary lifestyle;smoking/ tobacco exposure     Objective:     Vitals: LMP 02/27/2014   There is no height or weight on file to calculate BMI.  Advanced Directives 02/25/2019 02/18/2019 08/25/2018 04/22/2018 03/04/2018 02/23/2018 02/05/2017  Does Patient Have a Medical Advance Directive? Yes No No No No No No  Type of Advance Directive Out of facility DNR (pink MOST or yellow form) - - - - - -  Does patient want to make changes to medical advance directive? No - Patient declined - - - - - -  Would patient like information on creating a medical advance directive? - - Yes (MAU/Ambulatory/Procedural Areas - Information given) - - Yes (MAU/Ambulatory/Procedural Areas - Information given) Yes (MAU/Ambulatory/Procedural Areas - Information given)  Pre-existing out of facility DNR order (yellow form or pink MOST form) - - - - - - -    Tobacco Social History   Tobacco Use  Smoking Status Former Smoker  . Packs/day: 3.00  . Years: 50.00  . Pack years: 150.00  . Types: Cigarettes  . Quit date: 05/10/2015  . Years since quitting: 3.8  Smokeless Tobacco Never Used  Tobacco Comment   Counseling sheet 07-2011      Counseling given: Not Answered Comment: Counseling sheet 07-2011    Clinical Intake:  Pre-visit preparation completed: No  Pain : 0-10 Pain Score: 8  Pain Type: Chronic pain Pain Location: Back Pain Orientation: Lower Pain Radiating Towards: No Pain Descriptors / Indicators: Aching Pain Onset: Other (comment)(several years) Pain Frequency: Constant Pain Relieving Factors: Ibuprofen,heating pad Effect of Pain on Daily Activities: Daily activity  Pain Relieving Factors: Ibuprofen,heating pad  BMI - recorded: 36.35 Nutritional  Status: BMI > 30  Obese Nutritional Risks: None Diabetes: Yes CBG done?: No Did pt. bring in CBG monitor from home?: Yes(90-140's) Glucose Meter Downloaded?: No  How often do you need to have someone help you when you read instructions, pamphlets, or other written materials from your doctor or pharmacy?: 1 - Never What is the last grade level you completed in school?: 12 Grade  Interpreter Needed?: No  Information entered by :: Maryland Luppino FNP-C  Past Medical History:  Diagnosis Date  . Abnormal vaginal bleeding   . Asthma   . Barrett's esophagus    with high grade dysplasia per endoscopy 05/2005 // followed by Dr. [name] ([facility])  . Chronic back pain   . COPD (chronic obstructive pulmonary disease) ([place])   . Diabetes mellitus without complication ([place])   . Duodenal ulcer disease    thought to be contributed at least partly by overuse of NSAIDs and salicylates (goody powdr)  . Endometrial polyp 03/2003   s/p resection in 03/2003. Path showing submucosal leiomyoma and benign  proliferative type endometrium  . Fibroid uterus    s/p myomectomy x 2  . GERD (gastroesophageal reflux disease)   . H/O failed moderate sedation   . Hiatal hernia   . History of CVA (cerebrovascular accident) 10/2000  . 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 Hyperlipidemia   . Hypertension   . Hypothyroidism    with hx of multinodular goiter (noted on neck US in 11/2002 - showing diffuse  nodularity and inhomogenous texture diffusely BL)  . Internal hemorrhoids    grade 2 per colonoscopy in 09/2006 - repeat colonoscopy rec in 5-10 years.  . Migraine   . Nevus   . Personal history of failed conscious sedation 08/11/2011  . Tachycardia   . Thyroid disease   . Tobacco abuse Jan 2014   quit   Past Surgical History:  Procedure Laterality Date  . CHOLECYSTECTOMY    . COLONOSCOPY  02/07/2010  .  HYSTEROSCOPY    . LAPAROSCOPIC APPENDECTOMY N/A 09/23/2015   Procedure: APPENDECTOMY LAPAROSCOPIC;  Surgeon: Georganna Skeans, MD;  Location: [facility];  Service: General;  Laterality: N/A;  . [place]   x 2 - In 1997, noted to have extensive pelvic adhesions and BL tubal obstruction  . TRANSTHORACIC ECHOCARDIOGRAM  06/2014    EF 55-60%.  No RWMA. Gr 1 DD. Cannot R/o PFO (consider bubble study).  Marland Kitchen UPPER GASTROINTESTINAL ENDOSCOPY  12/18/2009   Family History  Problem Relation Age of Onset  . COPD Mother   . Heart disease Father 82       had 5 heart attacks, first in late 60s  . COPD Father    Social History   Socioeconomic History  . Marital status: Divorced    Spouse name: Not on file  . Number of children: 0  . Years of education: 16  . Highest education level: High school graduate  Occupational History  . Occupation: Electronic record    Comment: [employer]  . Financial resource strain: Somewhat hard  . Food insecurity    Worry: Never true    Inability: Never true  . Transportation needs    Medical: No    Non-medical: No  Tobacco Use  . Smoking status: Former Smoker    Packs/day: 3.00    Years: 50.00    Pack years: 150.00    Types: Cigarettes    Quit date: 05/10/2015    Years since quitting: 3.8  . Smokeless tobacco: Never Used  . Tobacco comment: Counseling sheet 07-2011   Substance and Sexual Activity  . Alcohol use: No    Alcohol/week: 0.0 standard drinks  . Drug use: No  . Sexual activity: Never  Lifestyle  . Physical activity    Days per week: 0 days    Minutes per session: 0 min  . Stress: Rather much  Relationships  . Social Herbalist on phone: Twice a week    Gets together: Twice a week    Attends religious service: More than 4 times per year    Active member of club or organization: Yes    Attends meetings of clubs or organizations: More than 4 times per year    Relationship status: Divorced  Other Topics Concern   . Not on file  Social History Narrative   Financial assistance approved for 100% discount at [facility] and has [facility] card per Bonna Gains   01/15/2010      Diet- N/A   Caffeine- Goody powders   Married-Divorced   [employer] and lives with mother   Pets-2   Current/past profession-[employer]   Exercise-No   Living will-No   DNR-No   POA/HPOA-No          Outpatient Encounter Medications as of 02/25/2019  Medication Sig  . albuterol (PROAIR HFA) 108 (90 Base) MCG/ACT inhaler INHALE 2 PUFFS EVERY 6  HOURS AS NEEDED FOR  SHORTNESS OF BREATH OR  WHEEZING  . alum &  mag hydroxide-simeth (MAALOX/MYLANTA) 200-200-20 MG/5ML suspension Take by mouth every 6 (six) hours as needed for indigestion or heartburn.  Marland Kitchen aspirin EC 81 MG tablet Take 81 mg by mouth daily.  . Aspirin-Acetaminophen-Caffeine [id number] MG PACK Take 1 Package by mouth as needed.   Marland Kitchen atorvastatin (LIPITOR) 80 MG tablet TAKE ONE TABLET BY MOUTH  ONCE DAILY AT BEDTIME FOR  CHOLESTEROL  . Biotin 5000 MCG TABS Take by mouth daily.  . bisoprolol (ZEBETA) 5 MG tablet One twice daily  . fluticasone-salmeterol (ADVAIR HFA) 230-21 MCG/ACT inhaler Inhale 2 puffs into the lungs 2 (two) times daily.  Marland Kitchen glipiZIDE (GLUCOTROL XL) 5 MG 24 hr tablet TAKE 1 TABLET BY MOUTH  DAILY WITH BREAKFAST  . glucose blood (ONETOUCH VERIO) test strip Use to check blood sugar twice daily. Dx: E11.9  . hydrALAZINE (APRESOLINE) 50 MG tablet TAKE 1 TABLET BY MOUTH 3  TIMES DAILY  . ibuprofen (ADVIL,MOTRIN) 200 MG tablet Take 800 mg by mouth as needed for moderate pain.   Marland Kitchen ipratropium-albuterol (DUONEB) 0.5-2.5 (3) MG/3ML SOLN Take 3 mLs by nebulization every 6 (six) hours as needed.  Marland Kitchen levothyroxine (SYNTHROID) 137 MCG tablet Take 1 tablet (137 mcg total) by mouth daily before breakfast.  . LORazepam (ATIVAN) 0.5 MG tablet TAKE 1 TABLET BY MOUTH 3 TIMES A DAY AS NEEDED FOR ANXIETY OR NERVOUSNESS  . losartan (COZAAR) 100 MG tablet Take 1 tablet (100 mg total) by mouth  daily.  . Melatonin 10 MG TABS Take 1 tablet by mouth at bedtime.  Marland Kitchen omeprazole (PRILOSEC) 20 MG capsule Take 30- 60 min before your first and last meals of the day  . OneTouch Delica Lancets [id number] MISC Use to check blood sugar twice daily. Dx: E11.9  . SPIRIVA RESPIMAT 2.5 MCG/ACT AERS USE 2 PUFFS BY MOUTH ONCE  DAILY  . Valerian Root 500 MG CAPS Take 2 capsules by mouth at bedtime.  . Multiple Vitamins-Minerals (HAIR/SKIN/NAILS) TABS Take by mouth daily.  . [DISCONTINUED] budesonide-formoterol (SYMBICORT) 160-4.5 MCG/ACT inhaler 2 puffs twice daily. (Patient not taking: Reported on 02/18/2019)  . [DISCONTINUED] ipratropium (ATROVENT HFA) 17 MCG/ACT inhaler Inhale 2 puffs into the lungs 4 (four) times daily.   No facility-administered encounter medications on file as of 02/25/2019.     Activities of Daily Living In your present state of health, do you have any difficulty performing the following activities: 02/25/2019 02/18/2019  Hearing? N N  Vision? N N  Difficulty concentrating or making decisions? N N  Walking or climbing stairs? N N  Dressing or bathing? N N  Doing errands, shopping? N N  Preparing Food and eating ? Y N  Comment too much to [facility] due to back pain -  Using the Toilet? N N  In the past six months, have you accidently leaked urine? N N  Do you have problems with loss of bowel control? N N  Managing your Medications? N N  Managing your Finances? N N  Housekeeping or managing your Housekeeping? Y N  Comment limited due to pain -  Some recent data might be hidden    Patient Care Team: [name], NP as PCP - General (Geriatric Medicine) Leonie Man, MD as PCP - Cardiology (Cardiology) Tobi Bastos, RN as [place], Museum/gallery conservator as [place] Management    Assessment:   This is a routine wellness examination for [place].  Exercise Activities and Dietary recommendations Current Exercise Habits:  The patient does  not participate in regular exercise at present, Exercise limited by: Other - see comments(limited due to lower back pain)  Goals    . Blood Pressure < 140/90    . Exercise 3x per week (30 min per time)     Starting 02/15/16, I will attempt to start exercising 3 times per week, at home.     . Increase physical activity     Patient will call silver sneakers and try to exercise throughout the week    . LDL CALC < 100    . Quit smoking / using tobacco       Fall Risk Fall Risk  02/18/2019 01/26/2019 09/01/2018 08/25/2018 05/13/2018  Falls in the past year? 0 0 0 0 0  Number falls in past yr: - - 0 0 0  Injury with Fall? - - 0 0 0   Is the patient's home free of loose throw rugs in walkways, pet beds, electrical cords, etc?   yes      Grab bars in the bathroom? no      Handrails on the stairs?   yes      Adequate lighting?   yes  Depression Screen PHQ 2/9 Scores 02/18/2019 02/23/2018 01/04/2018 02/05/2017  PHQ - 2 Score 0 6 0 5  PHQ- 9 Score - 15 - 14     Cognitive Function MMSE - Mini Mental State Exam 02/15/2016  Orientation to time 5  Orientation to Place 5  Registration 3  Attention/ Calculation 5  Recall 3  Language- name 2 objects 2  Language- repeat 1  Language- follow 3 step command 3  Language- read & follow direction 1  Write a sentence 1  Copy design 1  Total score 30     6CIT Screen 02/25/2019  What Year? 0 points  What month? 0 points  What time? 0 points  Count back from 20 0 points  Months in reverse 0 points  Repeat phrase 0 points  Total Score 0    Immunization History  Administered Date(s) Administered  . Influenza Split 04/25/2011  . Influenza Whole 02/06/2009  . Influenza, Seasonal, Injecte, Preservative Fre 05/20/2012  . Influenza,inj,Quad PF,6+ Mos 03/15/2013, 01/12/2015, 01/25/2016, 12/23/2016, 01/22/2018, 01/26/2019  . Influenza-Unspecified 12/13/2013  . Pneumococcal Polysaccharide-23 05/20/2012  . Tdap 06/12/2011  .  Zoster 12/16/2015    Qualifies for Shingles Vaccine?   Screening Tests Health Maintenance  Topic Date Due  . HIV Screening  05/10/1970  . PAP SMEAR-Modifier  07/30/2017  . FOOT EXAM  01/23/2019  . OPHTHALMOLOGY EXAM  03/03/2019  . HEMOGLOBIN A1C  07/27/2019  . MAMMOGRAM  11/12/2019  . Fecal DNA (Cologuard)  03/10/2020  . TETANUS/TDAP  06/11/2021  . INFLUENZA VACCINE  Completed  . PNEUMOCOCCAL POLYSACCHARIDE VACCINE AGE 62-64 HIGH RISK  Completed  . Hepatitis C Screening  Completed    Cancer Screenings: Lung: Low Dose CT Chest recommended if Age 71-80 years, 30 pack-year currently smoking OR have quit w/in 15years. Patient does qualify. Breast:  Up to date on Mammogram? Yes   Up to date of Bone Density/Dexa? No  Colorectal: Up to date   Additional Screenings: Hepatitis C Screening: Completed.      Plan:   - Pap smear on next visit with PCP 03/09/2019  - Opthalmology for eye exam has appointment in [place]  - refer to podiatrist for annual foot exam  - Bone density  - Advise to get Shingrix at her pharmacy.  I have personally reviewed and noted the  following in the patient's chart:   . Medical and social history . Use of alcohol, tobacco or illicit drugs  . Current medications and supplements . Functional ability and status . Nutritional status . Physical activity . Advanced directives . List of other physicians . Hospitalizations, surgeries, and ER visits in previous 12 months . Vitals . Screenings to include cognitive, depression, and falls . Referrals and appointments  In addition, I have reviewed and discussed with patient certain preventive protocols, quality metrics, and best practice recommendations. A written personalized care plan for preventive services as well as general preventive health recommendations were provided to patient.  Sandrea Hughs, NP  02/25/2019

## 2019-02-28 ENCOUNTER — Telehealth: Payer: Self-pay | Admitting: Pulmonary Disease

## 2019-02-28 NOTE — Telephone Encounter (Signed)
FYI: I spoke with pt and she has decided that she doesn't wish to participate in the lung cancer screening program . Referral has been cancelled.

## 2019-03-02 ENCOUNTER — Ambulatory Visit: Payer: Medicare Other

## 2019-03-02 ENCOUNTER — Encounter: Payer: Medicare Other | Admitting: Acute Care

## 2019-03-09 ENCOUNTER — Ambulatory Visit: Payer: Medicare Other | Admitting: Nurse Practitioner

## 2019-03-21 ENCOUNTER — Other Ambulatory Visit: Payer: Self-pay | Admitting: *Deleted

## 2019-03-21 NOTE — Patient Outreach (Signed)
[facility]) Care Management  03/21/2019  Grace [name] 31-Aug-1955 [id number]    Telephone Assessment-Unsuccessful  RN attempted outreach call today however unsuccessful. RN able to leave a HIPAA approved voice message requesting a call back.   Plan: RN will follow up with another call over the next week for ongoing [facility] services.  [name], RN Care Management Coordinator [place] Office [phone]

## 2019-03-24 ENCOUNTER — Other Ambulatory Visit: Payer: Self-pay

## 2019-03-30 ENCOUNTER — Ambulatory Visit: Payer: Medicare Other | Admitting: Nurse Practitioner

## 2019-03-31 ENCOUNTER — Other Ambulatory Visit: Payer: Self-pay | Admitting: Nurse Practitioner

## 2019-03-31 DIAGNOSIS — E119 Type 2 diabetes mellitus without complications: Secondary | ICD-10-CM

## 2019-04-05 ENCOUNTER — Other Ambulatory Visit: Payer: Self-pay | Admitting: *Deleted

## 2019-04-05 NOTE — Patient Outreach (Signed)
Grace [name]  04/05/2019  Grace [name] 1955-12-11 [id number]    Telephone Assessment  RN attempted outreach call however unsuccessful. RN able to leave a HIPAA approved voice message requesting a call back.  Plan: Will scheduled another outreach call.  [name], RN Care [name] Coordinator [place] Office [phone]

## 2019-04-12 ENCOUNTER — Other Ambulatory Visit: Payer: Self-pay | Admitting: *Deleted

## 2019-04-12 NOTE — Patient Outreach (Signed)
[facility]) Care Management  04/12/2019  Grace [name] 12/06/1955 [id number]    Telephone Assessment-Successful  RN spoke with pt today and updated the plan of care. Pt reports she has missed an appointment but called to cancelled. Pt states she was not able to go to her annual du.e to the co-pay of $30. Pt made the office aware however unable to work her a plan. Pt states she is much need of appliances for the house and must budget for that when that are no longer working. RN provided some resources in the community for possible discounted items. Pt unable to go to a furniture stores for purchases because she would not be able once again to afford the monthly payments. Pt states she is currently awaiting on the stimilus check prior to making any additional scheduled appointment for funds to cover the co-pays.  Pt has no one to assist with her current hardship related to her co-pays. Pt denies any distress and asymptomatic at this time in managing her COPD. RN further discussed her COPD related to his plan of care.  All interventions and goals discussed and adjustments made accordingly as pt states she is using her nebulizers as prescribed to keep her symptoms under control.   Pt also indicated she was pending an appointment in Jan however if she has the co-pay. States she continues with the study she is involved in concerning use of her CPAP. It has been suggested that she needs  BiPAP. This information has been presented to her provider recently but pt has not received a response. Other recommendations from the study suggest pt needs long term Prednisone therapy. Again this information has been relayed to pt's primary.   RN further discussed the current plan of care with updates on interventions that have been adjusted according to pt's progress. Will continue to communicate with pt's provider on pt's disposition with [facility] services. Based upon pt's progress with follow up in March  quarterly based upon pt's request.  [facility] CM Care Plan Problem One     Most Recent Value  Care Plan Problem One  Deficient Knowledge related to COPD unfamiliarity with information /education  Role Documenting the Problem One  Care Management [place] for Problem One  Active  [facility] Long Term Goal   Pt will verbalize the plan of aciton in the YELLOW zone within the next 60 days.  [facility] Long Term Goal Start Date  02/08/19  Interventions for Problem One Long Term Goal  Will continue to reiteate this topic for future encounters. Will review this information and stress the importance of  prevvention measures with COPD exacerbtions symptoms.   [facility] CM Short Term Goal #1   Pt will keep all her scheduled appointments within the next 30 days.  [facility] CM Short Term Goal #1 Start Date  02/08/19  Interventions for Short Term Goal #1  Discussed the importance of maintain all scheduled appointments. Pt has cancelled recent appointment due to co-pay. Pt pending reschedule this appointment. Current no acute symtpoms with use of her nebulizer 3X daily.  [facility] CM Short Term Goal #2   Pt will verbalize two symptoms of COPD exacerbation within the next 30 days  [facility] CM Short Term Goal #2 Start Date  02/08/19  [facility] CM Short Term Goal #2 Met Date  04/12/19      [name], RN Care Management Coordinator [place] Office [phone]

## 2019-04-27 ENCOUNTER — Ambulatory Visit: Payer: Self-pay | Admitting: *Deleted

## 2019-05-06 ENCOUNTER — Ambulatory Visit (INDEPENDENT_AMBULATORY_CARE_PROVIDER_SITE_OTHER): Payer: Medicare Other | Admitting: Nurse Practitioner

## 2019-05-06 ENCOUNTER — Other Ambulatory Visit: Payer: Self-pay

## 2019-05-06 ENCOUNTER — Encounter: Payer: Self-pay | Admitting: Nurse Practitioner

## 2019-05-06 VITALS — BP 136/84 | HR 86 | Temp 97.7°F | Ht 63.0 in | Wt 213.0 lb

## 2019-05-06 DIAGNOSIS — K089 Disorder of teeth and supporting structures, unspecified: Secondary | ICD-10-CM

## 2019-05-06 DIAGNOSIS — J4489 Other specified chronic obstructive pulmonary disease: Secondary | ICD-10-CM

## 2019-05-06 DIAGNOSIS — Z598 Other problems related to housing and economic circumstances: Secondary | ICD-10-CM | POA: Diagnosis not present

## 2019-05-06 DIAGNOSIS — E1140 Type 2 diabetes mellitus with diabetic neuropathy, unspecified: Secondary | ICD-10-CM | POA: Diagnosis not present

## 2019-05-06 DIAGNOSIS — K047 Periapical abscess without sinus: Secondary | ICD-10-CM | POA: Diagnosis not present

## 2019-05-06 DIAGNOSIS — J449 Chronic obstructive pulmonary disease, unspecified: Secondary | ICD-10-CM

## 2019-05-06 DIAGNOSIS — Z599 Problem related to housing and economic circumstances, unspecified: Secondary | ICD-10-CM

## 2019-05-06 DIAGNOSIS — E034 Atrophy of thyroid (acquired): Secondary | ICD-10-CM

## 2019-05-06 DIAGNOSIS — I10 Essential (primary) hypertension: Secondary | ICD-10-CM

## 2019-05-06 MED ORDER — AMOXICILLIN-POT CLAVULANATE 875-125 MG PO TABS: 1.0000 | ORAL_TABLET | Freq: Two times a day (BID) | ORAL | 0 refills | Status: DC

## 2019-05-06 MED ORDER — CHLORHEXIDINE GLUCONATE 0.12 % MT SOLN: 5.0000 mL | Freq: Two times a day (BID) | OROMUCOSAL | 3 refills | Status: DC

## 2019-05-06 NOTE — Progress Notes (Signed)
Careteam: Patient Care Team: [name], NP as PCP - General (Geriatric Medicine) Leonie Man, MD as PCP - Cardiology (Cardiology) Tobi Bastos, RN as [place] Management  Advanced Directive information    Allergies  Allergen Reactions  . Amlodipine     Cramps   . Anoro Ellipta [Umeclidinium-Vilanterol] Other (See Comments)    Chest pain if used more than 3 consecutive days   . Codeine     REACTION: headaches  . Gabapentin     headache  . Prozac [Fluoxetine Hcl] Other (See Comments)    Suicidal ideations    Chief Complaint  Patient presents with  . Acute Visit    Gum abscess x several years and face swelling as a result. Patient states the dentist is too expensive. Patient denies pain associated with abcess.   . Results    Discuss sleep study completed in 01/04/2019     HPI: Patient is a 64 y.o. female due to a tooth abscess reports she has had this is 2017- prior to this.  Went on antibiotics for a month at that time.  Reports now her face is swollen and feels like it is in her sinuses.  Reports she can not afford to go to the dentist.  Constantly pushing out blood and puss-- hurts when she does that- otherwise without pain No fever No recent antibiotic.   Recently got cpap for OSA doing well with this.   Review of Systems:  Review of Systems  Constitutional: Negative for chills, fever, malaise/fatigue and weight loss.  HENT: Negative for ear pain, hearing loss, sinus pain and sore throat.   Respiratory: Positive for shortness of breath (chronic) and wheezing (ongoing).   Cardiovascular: Positive for palpitations. Negative for chest pain and leg swelling.    Past Medical History:  Diagnosis Date  . Abnormal vaginal bleeding   . Asthma   . Barrett's esophagus    with high grade dysplasia per endoscopy 05/2005 // followed by Dr. [name] ([facility])  . Chronic back pain   . COPD (chronic obstructive pulmonary disease)  ([place])   . Diabetes mellitus without complication ([place])   . Duodenal ulcer disease    thought to be contributed at least partly by overuse of NSAIDs and salicylates (goody powdr)  . Endometrial polyp 03/2003   s/p resection in 03/2003. Path showing submucosal leiomyoma and benign  proliferative type endometrium  . Fibroid uterus    s/p myomectomy x 2  . GERD (gastroesophageal reflux disease)   . H/O failed moderate sedation   . Hiatal hernia   . History of CVA (cerebrovascular accident) 10/2000  . 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 Hyperlipidemia   . Hypertension   . Hypothyroidism    with hx of multinodular goiter (noted on neck US in 11/2002 - showing diffuse nodularity and inhomogenous texture diffusely BL)  . Internal hemorrhoids    grade 2 per colonoscopy in 09/2006 - repeat colonoscopy rec in 5-10 years.  . Migraine   . Nevus   . Personal history of failed conscious sedation 08/11/2011  . Tachycardia   . Thyroid disease   . Tobacco abuse Jan 2014   quit   Past Surgical History:  Procedure Laterality Date  . CHOLECYSTECTOMY    . COLONOSCOPY  02/07/2010  . HYSTEROSCOPY    .  LAPAROSCOPIC APPENDECTOMY N/A 09/23/2015   Procedure: APPENDECTOMY LAPAROSCOPIC;  Surgeon: Georganna Skeans, MD;  Location: [facility];  Service: General;  Laterality: N/A;  . [place]   x 2 - In 1997, noted to have extensive pelvic adhesions and BL tubal obstruction  . TRANSTHORACIC ECHOCARDIOGRAM  06/2014    EF 55-60%.  No RWMA. Gr 1 DD. Cannot R/o PFO (consider bubble study).  [facility] Kitchen UPPER GASTROINTESTINAL ENDOSCOPY  12/18/2009   Social History:   reports that she quit smoking about 3 years ago. Her smoking use included cigarettes. She has a 150.00 pack-year smoking history. She has never used smokeless tobacco. She reports that she does not drink alcohol or use drugs.  Family History  Problem Relation  Age of Onset  . COPD Mother   . Heart disease Father 74       had 5 heart attacks, first in late 58s  . COPD Father     Medications: Patient's Medications  New Prescriptions   No medications on file  Previous Medications   ALBUTEROL (PROAIR HFA) 108 (90 BASE) MCG/ACT INHALER    INHALE 2 PUFFS EVERY 6  HOURS AS NEEDED FOR  SHORTNESS OF BREATH OR  WHEEZING   ALUM & MAG HYDROXIDE-SIMETH (MAALOX/MYLANTA) 200-200-20 MG/5ML SUSPENSION    Take by mouth every 6 (six) hours as needed for indigestion or heartburn.   ASPIRIN EC 81 MG TABLET    Take 81 mg by mouth daily.   ASPIRIN-ACETAMINOPHEN-CAFFEINE [id number] MG PACK    Take 1 Package by mouth as needed.    ATORVASTATIN (LIPITOR) 80 MG TABLET    TAKE ONE TABLET BY MOUTH  ONCE DAILY AT BEDTIME FOR  CHOLESTEROL   BIOTIN 5000 MCG TABS    Take by mouth daily.   BISOPROLOL (ZEBETA) 5 MG TABLET    One twice daily   FLUTICASONE-SALMETEROL (ADVAIR HFA) 230-21 MCG/ACT INHALER    Inhale 2 puffs into the lungs 2 (two) times daily.   GLIPIZIDE (GLUCOTROL XL) 5 MG 24 HR TABLET    TAKE 1 TABLET BY MOUTH  DAILY WITH BREAKFAST   GLUCOSE BLOOD (ONETOUCH VERIO) TEST STRIP    Use to check blood sugar twice daily. Dx: E11.9   HYDRALAZINE (APRESOLINE) 50 MG TABLET    TAKE 1 TABLET BY MOUTH 3  TIMES DAILY   IBUPROFEN (ADVIL,MOTRIN) 200 MG TABLET    Take 800 mg by mouth as needed for moderate pain.    IPRATROPIUM-ALBUTEROL (DUONEB) 0.5-2.5 (3) MG/3ML SOLN    Take 3 mLs by nebulization every 6 (six) hours as needed.   LANCETS (ONETOUCH DELICA PLUS 123XX123) MISC    USE TO CHECK BLOOD SUGAR  TWICE DAILY   LEVOTHYROXINE (SYNTHROID) 137 MCG TABLET    Take 1 tablet (137 mcg total) by mouth daily before breakfast.   LORAZEPAM (ATIVAN) 0.5 MG TABLET    TAKE 1 TABLET BY MOUTH 3 TIMES A DAY AS NEEDED FOR ANXIETY OR NERVOUSNESS   LOSARTAN (COZAAR) 100 MG TABLET    Take 1 tablet (100 mg total) by mouth daily.   MELATONIN 10 MG TABS    Take 1 tablet by mouth at bedtime.    MULTIPLE VITAMINS-MINERALS (HAIR/SKIN/NAILS) TABS    Take by mouth daily.   OMEPRAZOLE (PRILOSEC) 20 MG CAPSULE    Take 30- 60 min before your first and last meals of the day   SPIRIVA RESPIMAT 2.5 MCG/ACT AERS    USE 2 PUFFS BY MOUTH ONCE  DAILY   VALERIAN  ROOT 500 MG CAPS    Take 2 capsules by mouth at bedtime.  Modified Medications   No medications on file  Discontinued Medications   No medications on file    Physical Exam:  Vitals:   05/06/19 1104  BP: 136/84  Pulse: 86  Temp: 97.7 F (36.5 C)  TempSrc: Temporal  SpO2: 97%  Weight: 213 lb (96.6 kg)  Height: 5\' 3"  (1.6 m)   Body mass index is 37.73 kg/m. Wt Readings from Last 3 Encounters:  05/06/19 213 lb (96.6 kg)  02/07/19 211 lb 9.6 oz (96 kg)  01/26/19 205 lb 3.2 oz (93.1 kg)    Physical Exam Constitutional:      General: She is not in acute distress.    Appearance: She is well-developed. She is not diaphoretic.  HENT:     Head: Normocephalic and atraumatic.     Mouth/Throat:     Dentition: Abnormal dentition. Dental caries and dental abscesses (to right upper gum) present. No dental tenderness or gum lesions.  Eyes:     Conjunctiva/sclera: Conjunctivae normal.     Pupils: Pupils are equal, round, and reactive to light.  Cardiovascular:     Rate and Rhythm: Normal rate and regular rhythm.     Heart sounds: Normal heart sounds.  Pulmonary:     Effort: Pulmonary effort is normal.     Breath sounds: Decreased breath sounds present.  Musculoskeletal:        General: No tenderness.     Cervical back: Normal range of motion and neck supple.     Right lower leg: Edema (1+) present.     Left lower leg: Edema (1+) present.  Skin:    General: Skin is warm and dry.  Neurological:     Mental Status: She is alert and oriented to person, place, and time.     Labs reviewed: Basic Metabolic Panel: Recent Labs    06/17/18 0825 01/26/19 0925  NA  --  141  K  --  4.1  CL  --  105  CO2  --  27  GLUCOSE  --   97  BUN  --  23  CREATININE  --  0.81  CALCIUM  --  9.3  TSH 1.51 0.14*   Liver Function Tests: Recent Labs    01/26/19 0925  AST 15  ALT 13  BILITOT 0.3  PROT 6.6   No results for input(s): LIPASE, AMYLASE in the last 8760 hours. No results for input(s): AMMONIA in the last 8760 hours. CBC: Recent Labs    01/26/19 0925 02/07/19 1548  WBC 8.0 10.3  NEUTROABS 6,176 7.3  HGB 11.4* 11.3*  HCT 36.5 36.3  MCV 80.8 82.1  PLT 268 243.0   Lipid Panel: Recent Labs    01/26/19 0925  CHOL 126  HDL 53  LDLCALC 57  TRIG 80  CHOLHDL 2.4   TSH: Recent Labs    06/17/18 0825 01/26/19 0925  TSH 1.51 0.14*   A1C: Lab Results  Component Value Date   HGBA1C 6.3 (H) 01/26/2019     Assessment/Plan 1. Poor dentition - AMB Referral to [facility] - chlorhexidine (PERIDEX) 0.12 % solution; Use as directed 5 mLs in the mouth or throat 2 (two) times daily. Swish and spit twice daily  Dispense: 475 mL; Refill: 3  2. Financial difficulties - AMB Referral to [facility]- already following pt  3. Abscessed tooth -strongly recommended dentist follow up - AMB Referral to [facility]  Management - CBC with Differential/Platelet - chlorhexidine (PERIDEX) 0.12 % solution; Use as directed 5 mLs in the mouth or throat 2 (two) times daily. Swish and spit twice daily  Dispense: 475 mL; Refill: 3 - amoxicillin-clavulanate (AUGMENTIN) 875-125 MG tablet; Take 1 tablet by mouth 2 (two) times daily.  Dispense: 28 tablet; Refill: 0  4. Type 2 diabetes mellitus with diabetic neuropathy, without long-term current use of insulin (HCC) - Hemoglobin A1c  5. Essential hypertension -controlled on current regimen, continue dietary modifications with current medications. - BASIC METABOLIC PANEL WITH GFR - CBC with Differential/Platelet  6. COPD with asthma ([place]) -severe COPD, symptoms under moderate control. She still complaints of shortness of breath frequently.,  7.  Hypothyroidism due to acquired atrophy of thyroid -no follow up TSH was done after last dose reduction. - TSH  Next appt: 1 month for PAP and follow up.  Carlos American. [place], [facility] [phone]

## 2019-05-06 NOTE — Patient Instructions (Addendum)
   Regular brushing with a fluoridated toothpaste and dental flossing after each meal  Dietary counseling to reduce the ingestion of sugar-rich foods or beverages  chlorhexidine twice daily  no smoking   To take augmentin twice daily for 14 days Take probiotic twice daily as well Please make appt with dentist to work out a [facility] in [place], [place] Address: [address], [place], [place] [address] Phone: [phone]  Or call the [employer] to see if they have any programs  1 month for PAP and follow up

## 2019-05-12 LAB — CBC WITH DIFFERENTIAL/PLATELET
Absolute Monocytes: 689 cells/uL (ref 200–950)
Basophils Absolute: 50 cells/uL (ref 0–200)
Basophils Relative: 0.6 %
Eosinophils Absolute: 415 cells/uL (ref 15–500)
Eosinophils Relative: 5 %
HCT: 36.6 % (ref 35.0–45.0)
Hemoglobin: 11.3 g/dL — ABNORMAL LOW (ref 11.7–15.5)
Lymphs Abs: 1494 cells/uL (ref 850–3900)
MCH: 25.3 pg — ABNORMAL LOW (ref 27.0–33.0)
MCHC: 30.9 g/dL — ABNORMAL LOW (ref 32.0–36.0)
MCV: 82.1 fL (ref 80.0–100.0)
MPV: 10.8 fL (ref 7.5–12.5)
Monocytes Relative: 8.3 %
Neutro Abs: 5652 cells/uL (ref 1500–7800)
Neutrophils Relative %: 68.1 %
Platelets: 279 10*3/uL (ref 140–400)
RBC: 4.46 10*6/uL (ref 3.80–5.10)
RDW: 15.8 % — ABNORMAL HIGH (ref 11.0–15.0)
Total Lymphocyte: 18 %
WBC: 8.3 10*3/uL (ref 3.8–10.8)

## 2019-05-12 LAB — BASIC METABOLIC PANEL WITH GFR
BUN: 11 mg/dL (ref 7–25)
CO2: 27 mmol/L (ref 20–32)
Calcium: 9.4 mg/dL (ref 8.6–10.4)
Chloride: 107 mmol/L (ref 98–110)
Creat: 0.95 mg/dL (ref 0.50–0.99)
GFR, Est African American: 73 mL/min/{1.73_m2} (ref >=60)
GFR, Est Non African American: 63 mL/min/{1.73_m2} (ref >=60)
Glucose, Bld: 89 mg/dL (ref 65–99)
Potassium: 4.3 mmol/L (ref 3.5–5.3)
Sodium: 141 mmol/L (ref 135–146)

## 2019-05-12 LAB — HEMOGLOBIN A1C
Hgb A1c MFr Bld: 6.5 % of total Hgb — ABNORMAL HIGH (ref <5.7)
Mean Plasma Glucose: 140 (calc)
eAG (mmol/L): 7.7 (calc)

## 2019-05-12 LAB — IRON,TIBC AND FERRITIN PANEL
%SAT: 10 % (calc) — ABNORMAL LOW (ref 16–45)
Ferritin: 9 ng/mL — ABNORMAL LOW (ref 16–288)
Iron: 41 ug/dL — ABNORMAL LOW (ref 45–160)
TIBC: 406 mcg/dL (calc) (ref 250–450)

## 2019-05-12 LAB — TSH: TSH: 9.44 mIU/L — ABNORMAL HIGH (ref 0.40–4.50)

## 2019-05-12 LAB — TEST AUTHORIZATION

## 2019-05-13 ENCOUNTER — Other Ambulatory Visit: Payer: Self-pay | Admitting: *Deleted

## 2019-05-13 NOTE — Patient Outreach (Signed)
[facility]) Care Management  05/13/2019  Grace [name] 04/30/1955 [id number]    Telephone Assessment  Received a referral indicating pt needs assist with possible dental work for resources. RN attempted to contact pt however unsuccessful. Able to leave a HIPAA voice message requesting a call back. Will further assess at that time.  [name], RN Care Management Coordinator [place] Office [phone]

## 2019-05-16 ENCOUNTER — Other Ambulatory Visit: Payer: Self-pay

## 2019-05-16 ENCOUNTER — Other Ambulatory Visit: Payer: Self-pay | Admitting: *Deleted

## 2019-05-16 DIAGNOSIS — R790 Abnormal level of blood mineral: Secondary | ICD-10-CM

## 2019-05-16 MED ORDER — FERROUS SULFATE 325 (65 FE) MG PO TBEC: 325.0000 mg | DELAYED_RELEASE_TABLET | Freq: Every day | ORAL | 3 refills | Status: DC

## 2019-05-16 NOTE — Telephone Encounter (Signed)
Error

## 2019-05-16 NOTE — Patient Outreach (Signed)
Grace [name]) Care Management  05/16/2019  Grace [name] 1955/10/18 [id number]  Telephone Assessment  RN received a referral from pt's provider office concerning dental assistance. RN left voice message last week and received a call back today from pt to further assist.  RN spoke with pt concerning her needs who is aware that there maybe limitations on available resources. States she has an antibiotic for the current acbsess after visiting with her provider. RN has research some sources and was able to provide this information to the pt for different counties that maybe able to assist. RN encouraged pt to follow up for the available assistance based upon her financial hardship. Pt aware that her insurance does not coverage the dental services she needs. Pt aware she has needed these services for awhile (root canal, extractions and now this abscess). States she got 4 teeth broken down to the root and most of her dental issues came from her use of inhalers.  Discussion around pt's ongoing management of care as pt's plan of care as discussed with updated intervention on long term goal that would allow ongoing management of care. Pt also reports several things that have occurred since the last conversation. State she has obtained a counselor/coach through and agency called "[facility] [employer]": Grace [name]. States she has made some lifestyle changes by changing her mood and coping better with her life, dietary changes more plant-base diet and fish added to her diet (due to her A1c increased to 6.5 and weight gain). Pt has also purchased a stationary bike and now practicing breathing techniques to recovery quickly when SOB. Pt will follow up with her provider on 2/26 to repeat her A1c and received a pap-smear.   Based upon the available resources concerning pt's dental needs pt requested all information be emailed so she could contact the resources immediately for possible services.  Secure e-mail sent due to the urgency of this request based upon pt's verbal consent. No additional request or inquires as pt was very grateful for the available resources.  [facility] CM Care Plan Problem One     Most Recent Value  Care Plan Problem One  Deficient Knowledge related to COPD unfamiliarity with information /education  Role Documenting the Problem One  Care Management Coordinator  Care Plan for Problem One  Active  [facility] Long Term Goal   Pt will verbalize the plan of aciton in the YELLOW zone within the next 60 days.  THN Long Term Goal Start Date  02/08/19  Interventions for Problem One Long Term Goal  Will review COPD information once again and continue educate pt on the action plan and when to seek medical attention.  [facility] CM Short Term Goal #1   Pt will keep all her scheduled appointments within the next 30 days.  [facility] CM Short Term Goal #1 Start Date  02/08/19  [facility] CM Short Term Goal #1 Met Date  05/16/19     Grace Mina, RN Care Management Coordinator [place] Office [phone]

## 2019-05-17 ENCOUNTER — Encounter: Payer: Self-pay | Admitting: Nurse Practitioner

## 2019-05-17 MED ORDER — ALBUTEROL SULFATE HFA 108 (90 BASE) MCG/ACT IN AERS: INHALATION_SPRAY | RESPIRATORY_TRACT | 3 refills | Status: AC

## 2019-05-17 NOTE — Telephone Encounter (Signed)
High risk or very high risk warning populated when attempting to refill medication. RX request sent to PCP for review and approval if warranted.   

## 2019-05-17 NOTE — Telephone Encounter (Signed)
Janett Billow please advise if you have agreed to refill requested medication for patient is under the care of a lung specialist

## 2019-05-19 ENCOUNTER — Telehealth: Payer: Self-pay | Admitting: Pulmonary Disease

## 2019-05-19 NOTE — Telephone Encounter (Signed)
Attempted to call pt again x3 but each time I tried to call, it seemed like the call was going to connect but then it was disconnected each time. Unable to reach or leave a VM again for pt. Will keep in triage to follow up on to let pt know that we do not have any samples of rescue inhalers.

## 2019-05-19 NOTE — Telephone Encounter (Signed)
We do not have any samples of rescue inhalers.  Attempted to call pt to let her know this info but when trying to call pt, I received a call that the person at the number dialed cannot take call at this time. Will try to call back later.

## 2019-05-19 NOTE — Telephone Encounter (Signed)
Patient is returning phone call. Patient phone number is [phone].

## 2019-05-19 NOTE — Telephone Encounter (Signed)
ATC pt, no answer. Left message for pt to call back.  

## 2019-05-20 NOTE — Telephone Encounter (Signed)
ATC pt, no answer. Left message for pt to call back.  

## 2019-05-20 NOTE — Telephone Encounter (Signed)
Spoke with pt, advised her that we didn't carry samples of rescue inhalers. Pt understood and stated she would use her nebulizer if she needed. Nothing further is needed.

## 2019-05-20 NOTE — Telephone Encounter (Signed)
Pt returning a phone call.

## 2019-05-25 ENCOUNTER — Encounter: Payer: Self-pay | Admitting: *Deleted

## 2019-05-25 NOTE — Telephone Encounter (Signed)
This encounter was created in error - please disregard.

## 2019-06-10 ENCOUNTER — Ambulatory Visit: Payer: Medicare Other | Admitting: Nurse Practitioner

## 2019-06-16 ENCOUNTER — Other Ambulatory Visit: Payer: Self-pay | Admitting: Nurse Practitioner

## 2019-06-20 ENCOUNTER — Encounter: Payer: Self-pay | Admitting: *Deleted

## 2019-06-22 ENCOUNTER — Ambulatory Visit: Payer: Self-pay | Admitting: *Deleted

## 2019-06-24 ENCOUNTER — Ambulatory Visit: Payer: Self-pay | Admitting: Nurse Practitioner

## 2019-06-27 ENCOUNTER — Encounter: Payer: Self-pay | Admitting: Nurse Practitioner

## 2019-06-27 ENCOUNTER — Other Ambulatory Visit: Payer: Self-pay

## 2019-06-27 ENCOUNTER — Ambulatory Visit (INDEPENDENT_AMBULATORY_CARE_PROVIDER_SITE_OTHER): Payer: Medicare Other | Admitting: Nurse Practitioner

## 2019-06-27 VITALS — BP 140/88 | HR 67 | Temp 96.8°F | Ht 63.0 in | Wt 210.0 lb

## 2019-06-27 DIAGNOSIS — Z124 Encounter for screening for malignant neoplasm of cervix: Secondary | ICD-10-CM

## 2019-06-27 DIAGNOSIS — B3731 Acute candidiasis of vulva and vagina: Secondary | ICD-10-CM

## 2019-06-27 DIAGNOSIS — Z9989 Dependence on other enabling machines and devices: Secondary | ICD-10-CM

## 2019-06-27 DIAGNOSIS — Z1231 Encounter for screening mammogram for malignant neoplasm of breast: Secondary | ICD-10-CM

## 2019-06-27 DIAGNOSIS — E66812 Obesity, class 2: Secondary | ICD-10-CM | POA: Insufficient documentation

## 2019-06-27 DIAGNOSIS — G4733 Obstructive sleep apnea (adult) (pediatric): Secondary | ICD-10-CM

## 2019-06-27 DIAGNOSIS — E034 Atrophy of thyroid (acquired): Secondary | ICD-10-CM | POA: Diagnosis not present

## 2019-06-27 DIAGNOSIS — Z0000 Encounter for general adult medical examination without abnormal findings: Secondary | ICD-10-CM | POA: Diagnosis not present

## 2019-06-27 DIAGNOSIS — J449 Chronic obstructive pulmonary disease, unspecified: Secondary | ICD-10-CM

## 2019-06-27 DIAGNOSIS — B373 Candidiasis of vulva and vagina: Secondary | ICD-10-CM

## 2019-06-27 DIAGNOSIS — Z6837 Body mass index (BMI) 37.0-37.9, adult: Secondary | ICD-10-CM

## 2019-06-27 LAB — TSH: TSH: 0.93 mIU/L (ref 0.40–4.50)

## 2019-06-27 MED ORDER — NEBULIZER/TUBING/MOUTHPIECE KIT: 1.0000 "application " | PACK | Freq: Four times a day (QID) | 11 refills | Status: DC | PRN

## 2019-06-27 MED ORDER — ALBUTEROL SULFATE HFA 108 (90 BASE) MCG/ACT IN AERS: 2.0000 | INHALATION_SPRAY | Freq: Once | RESPIRATORY_TRACT | Status: DC

## 2019-06-27 NOTE — Patient Instructions (Addendum)
KEEP UP THE GOOD WORK WITH DIET CHANGES AND INCREASE IN YOUR ACTIVITY Slowly increase your activity every day.   Follow up in 2-4 weeks for MOST form completion via virtual visit (can be done Tuesday or Thursday)   To call and set up your diabetic eye exam

## 2019-06-27 NOTE — Progress Notes (Signed)
Careteam: Patient Care Team: [name], NP as PCP - General (Geriatric Medicine) Leonie Man, MD as PCP - Cardiology (Cardiology) Tobi Bastos, RN as [place] Management  PLACE OF SERVICE:  [place] Directive information    Allergies  Allergen Reactions  . Amlodipine     Cramps   . Anoro Ellipta [Umeclidinium-Vilanterol] Other (See Comments)    Chest pain if used more than 3 consecutive days   . Codeine     REACTION: headaches  . Gabapentin     headache  . Prozac [Fluoxetine Hcl] Other (See Comments)    Suicidal ideations    Chief Complaint  Patient presents with  . Follow-up    1 month follow-up for pap smear and update non-opioid treatment agreement   . Quality Metric Gaps    Discuss need for eye exam and foot exam   . Orders    Patient needs tubing for Nebulier and CPAP supplies sent to [facility] [phone]     HPI: Patient is a 64 y.o. female for preventative care Said she has not gotten an e-mail for her diabetic eye exam.- due in November, reports she will get in touch with them to get this done.   Needs tubing for nebulizer and CPAP machine, got machine from her sleep study.   Has been to the dentist after her Abscessed tooth. She goes back the 5th to get abstractions but has had them clean. Has a payment arrangement. Dentist but her on another round of amoxicillin.  Dr Burt Ek at [facility].   Cut down on pepsi, making dietary changes.  Got a bike, gone from 2 mins a day to 20 mins a day.  Needs resources for recipes- funny eater   Anxiety- controlled, occasionally will need lorazepam but not daily   Review of Systems:  Review of Systems  Constitutional: Negative for chills, fever and weight loss.  HENT: Negative for tinnitus.        Poor dentition   Respiratory: Positive for cough and shortness of breath. Negative for sputum production.   Cardiovascular: Negative for chest pain, palpitations  and leg swelling.  Gastrointestinal: Negative for abdominal pain, constipation, diarrhea and heartburn.  Genitourinary: Negative for dysuria, frequency and urgency.       Vaginal yeast infection after antibiotic use  Musculoskeletal: Negative for back pain, falls, joint pain and myalgias.  Skin: Negative.   Neurological: Negative for dizziness and headaches.  Psychiatric/Behavioral: Negative for depression and memory loss. The patient does not have insomnia.     Past Medical History:  Diagnosis Date  . Abnormal vaginal bleeding   . Asthma   . Barrett's esophagus    with high grade dysplasia per endoscopy 05/2005 // followed by Dr. [name] ([facility])  . Chronic back pain   . COPD (chronic obstructive pulmonary disease) ([place])   . Diabetes mellitus without complication ([place])   . Duodenal ulcer disease    thought to be contributed at least partly by overuse of NSAIDs and salicylates (goody powdr)  . Endometrial polyp 03/2003   s/p resection in 03/2003. Path showing submucosal leiomyoma and benign  proliferative type endometrium  . Fibroid uterus    s/p myomectomy x 2  . GERD (gastroesophageal reflux disease)   . H/O failed moderate sedation   . Hiatal hernia   . History of CVA (cerebrovascular accident) 10/2000  . History of pineal cyst    11 mm cystic mass noted  in the pineal gland per MRI in 2001 - most consitent with simple pineal cyst  . Hyperlipidemia   . Hypertension   . Hypothyroidism    with hx of multinodular goiter (noted on neck US in 11/2002 - showing diffuse nodularity and inhomogenous texture diffusely BL)  . Internal hemorrhoids    grade 2 per colonoscopy in 09/2006 - repeat colonoscopy rec in 5-10 years.  . Migraine   . Nevus   . Personal history of failed conscious sedation 08/11/2011  . Tachycardia   . Thyroid disease   . Tobacco abuse Jan 2014   quit   Past Surgical History:  Procedure Laterality Date  . CHOLECYSTECTOMY    . COLONOSCOPY  02/07/2010  .  HYSTEROSCOPY    . LAPAROSCOPIC APPENDECTOMY N/A 09/23/2015   Procedure: APPENDECTOMY LAPAROSCOPIC;  Surgeon: Georganna Skeans, MD;  Location: [facility];  Service: General;  Laterality: N/A;  . [place]   x 2 - In 1997, noted to have extensive pelvic adhesions and BL tubal obstruction  . TRANSTHORACIC ECHOCARDIOGRAM  06/2014    EF 55-60%.  No RWMA. Gr 1 DD. Cannot R/o PFO (consider bubble study).  Marland Kitchen UPPER GASTROINTESTINAL ENDOSCOPY  12/18/2009   Social History:   reports that she quit smoking about 4 years ago. Her smoking use included cigarettes. She has a 150.00 pack-year smoking history. She has never used smokeless tobacco. She reports that she does not drink alcohol or use drugs.  Family History  Problem Relation Age of Onset  . COPD Mother   . Heart disease Father 41       had 5 heart attacks, first in late 66s  . COPD Father     Medications: Patient's Medications  New Prescriptions   No medications on file  Previous Medications   ALBUTEROL (PROAIR HFA) 108 (90 BASE) MCG/ACT INHALER    INHALE 2 PUFFS EVERY 6  HOURS AS NEEDED FOR  SHORTNESS OF BREATH OR  WHEEZING   ALUM & MAG HYDROXIDE-SIMETH (MAALOX/MYLANTA) 200-200-20 MG/5ML SUSPENSION    Take by mouth every 6 (six) hours as needed for indigestion or heartburn.   ASPIRIN EC 81 MG TABLET    Take 81 mg by mouth daily.   ASPIRIN-ACETAMINOPHEN-CAFFEINE 673-419-37 MG PACK    Take 1 Package by mouth as needed.    ATORVASTATIN (LIPITOR) 80 MG TABLET    TAKE ONE TABLET BY MOUTH  ONCE DAILY AT BEDTIME FOR  CHOLESTEROL   BIOTIN 5000 MCG TABS    Take by mouth daily.   BISOPROLOL (ZEBETA) 5 MG TABLET    One twice daily   CHLORHEXIDINE (PERIDEX) 0.12 % SOLUTION    Use as directed 5 mLs in the mouth or throat 2 (two) times daily. Swish and spit twice daily   FERROUS SULFATE 325 (65 FE) MG EC TABLET    Take 1 tablet (325 mg total) by mouth daily. With Meal.   FLUTICASONE-SALMETEROL (ADVAIR HFA) 230-21 MCG/ACT INHALER    Inhale 2 puffs  into the lungs 2 (two) times daily.   GLIPIZIDE (GLUCOTROL XL) 5 MG 24 HR TABLET    TAKE 1 TABLET BY MOUTH  DAILY WITH BREAKFAST   GLUCOSE BLOOD (ONETOUCH VERIO) TEST STRIP    Use to check blood sugar twice daily. Dx: E11.9   HYDRALAZINE (APRESOLINE) 50 MG TABLET    TAKE 1 TABLET BY MOUTH 3  TIMES DAILY   IBUPROFEN (ADVIL,MOTRIN) 200 MG TABLET    Take 800 mg by mouth as needed  for moderate pain.    IPRATROPIUM-ALBUTEROL (DUONEB) 0.5-2.5 (3) MG/3ML SOLN    Take 3 mLs by nebulization every 6 (six) hours as needed.   LANCETS (ONETOUCH DELICA PLUS [id number]) MISC    USE TO CHECK BLOOD SUGAR  TWICE DAILY   LEVOTHYROXINE (SYNTHROID) 137 MCG TABLET    TAKE 1 TABLET BY MOUTH  DAILY BEFORE BREAKFAST   LORAZEPAM (ATIVAN) 0.5 MG TABLET    TAKE 1 TABLET BY MOUTH 3 TIMES A DAY AS NEEDED FOR ANXIETY OR NERVOUSNESS   LOSARTAN (COZAAR) 100 MG TABLET    Take 1 tablet (100 mg total) by mouth daily.   MELATONIN 10 MG TABS    Take 1 tablet by mouth at bedtime.   MULTIPLE VITAMINS-MINERALS (HAIR/SKIN/NAILS) TABS    Take by mouth daily.   OMEPRAZOLE (PRILOSEC) 20 MG CAPSULE    Take 30- 60 min before your first and last meals of the day   SPIRIVA RESPIMAT 2.5 MCG/ACT AERS    USE 2 PUFFS BY MOUTH ONCE  DAILY   VALERIAN ROOT 500 MG CAPS    Take 2 capsules by mouth at bedtime.  Modified Medications   No medications on file  Discontinued Medications   AMOXICILLIN-CLAVULANATE (AUGMENTIN) 875-125 MG TABLET    Take 1 tablet by mouth 2 (two) times daily.    Physical Exam:  Vitals:   06/27/19 1325  BP: 140/88  Pulse: 67  Temp: (!) 96.8 F (36 C)  TempSrc: Temporal  SpO2: 97%  Weight: 210 lb (95.3 kg)  Height: 5' 3"  (1.6 m)   Body mass index is 37.2 kg/m. Wt Readings from Last 3 Encounters:  06/27/19 210 lb (95.3 kg)  05/06/19 213 lb (96.6 kg)  02/07/19 211 lb 9.6 oz (96 kg)    Physical Exam Exam conducted with a chaperone present.  Constitutional:      General: She is not in acute distress.     Appearance: She is well-developed. She is not diaphoretic.  HENT:     Head: Normocephalic and atraumatic.     Mouth/Throat:     Pharynx: No oropharyngeal exudate.  Eyes:     Conjunctiva/sclera: Conjunctivae normal.     Pupils: Pupils are equal, round, and reactive to light.  Cardiovascular:     Rate and Rhythm: Normal rate and regular rhythm.     Heart sounds: Normal heart sounds.  Pulmonary:     Effort: Pulmonary effort is normal.     Breath sounds: Normal breath sounds.  Chest:     Breasts:        Right: Normal.        Left: Normal.  Abdominal:     General: Bowel sounds are normal.     Palpations: Abdomen is soft.  Genitourinary:    Comments: Yeast noted to vaginal area Musculoskeletal:        General: No tenderness.     Cervical back: Normal range of motion and neck supple.  Lymphadenopathy:     Upper Body:     Right upper body: No supraclavicular, axillary or pectoral adenopathy.     Left upper body: No supraclavicular, axillary or pectoral adenopathy.  Skin:    General: Skin is warm and dry.  Neurological:     Mental Status: She is alert and oriented to person, place, and time.  Psychiatric:        Mood and Affect: Mood normal.        Behavior: Behavior normal.     Labs reviewed: Basic  Metabolic Panel: Recent Labs    01/26/19 0925 05/06/19 1208  NA 141 141  K 4.1 4.3  CL 105 107  CO2 27 27  GLUCOSE 97 89  BUN 23 11  CREATININE 0.81 0.95  CALCIUM 9.3 9.4  TSH 0.14* 9.44*   Liver Function Tests: Recent Labs    01/26/19 0925  AST 15  ALT 13  BILITOT 0.3  PROT 6.6   No results for input(s): LIPASE, AMYLASE in the last 8760 hours. No results for input(s): AMMONIA in the last 8760 hours. CBC: Recent Labs    01/26/19 0925 02/07/19 1548 05/06/19 1208  WBC 8.0 10.3 8.3  NEUTROABS 6,176 7.3 5,652  HGB 11.4* 11.3* 11.3*  HCT 36.5 36.3 36.6  MCV 80.8 82.1 82.1  PLT 268 243.0 279   Lipid Panel: Recent Labs    01/26/19 0925  CHOL 126  HDL 53   LDLCALC 57  TRIG 80  CHOLHDL 2.4   TSH: Recent Labs    01/26/19 0925 05/06/19 1208  TSH 0.14* 9.44*   A1C: Lab Results  Component Value Date   HGBA1C 6.5 (H) 05/06/2019     Assessment/Plan 1. Class 2 severe obesity due to excess calories with serious comorbidity and body mass index (BMI) of 37.0 to 37.9 in adult [facility]) Pt has already started making changes including increase in physical activity, cutting back on sugars and overall finding healthier recipes. Discussed weight watchers which she is thinking about joining. Congratulated her on her good work and encouraged to continue.   2. Preventative health care - The patient was counseled regarding the appropriate use of alcohol, regular self-examination of the breasts on a monthly basis, prevention of dental and periodontal disease, diet, regular sustained exercise for at least 30 minutes 5 times per week, routine screening interval for mammogram as recommended by the [place] and ACOG, importance of regular PAP smears,  tobacco use,  and recommended schedule for GI hemoccult testing, colonoscopy, cholesterol, thyroid and diabetes screening. -weight loss discussed and recommendations given for increase physical activity as tolerates- she has already slowly increased with dietary modifications which she has started to make changes.  - PAP, Image Guided [[facility]] - MM DIGITAL SCREENING BILATERAL; Future  3. OSA on CPAP - Respiratory Therapy Supplies (NEBULIZER/TUBING/MOUTHPIECE) KIT; 1 application by Does not apply route every 6 (six) hours as needed.  Dispense: 1 kit; Refill: 11  4. COPD with asthma ([place]) -with chronic shortness of breath  - albuterol (VENTOLIN HFA) 108 (90 Base) MCG/ACT inhaler 2 puff  5. Cervical cancer screening - PAP, Image Guided [[facility]]  6. Hypothyroidism due to acquired atrophy of thyroid -has changed the way she takes medication and done so consistently. Will follow up TSH  at this time - TSH  7. Encounter for screening mammogram for malignant neoplasm of breast - MM DIGITAL SCREENING BILATERAL; Future  8. Vaginal yeast infection -plans to get monostat OTC topically to treat, will notify if persistent despite treatment  Next appt: 2-4 weeks for most form completion via virtual visit Magdalyn Arenivas K. [place], [facility] [phone]

## 2019-06-29 LAB — PAP IG (IMAGE GUIDED)

## 2019-07-04 ENCOUNTER — Encounter: Payer: Self-pay | Admitting: Nurse Practitioner

## 2019-07-14 ENCOUNTER — Other Ambulatory Visit: Payer: Self-pay | Admitting: Nurse Practitioner

## 2019-07-14 DIAGNOSIS — E1169 Type 2 diabetes mellitus with other specified complication: Secondary | ICD-10-CM

## 2019-07-14 DIAGNOSIS — E785 Hyperlipidemia, unspecified: Secondary | ICD-10-CM

## 2019-07-15 ENCOUNTER — Encounter: Payer: Self-pay | Admitting: Nurse Practitioner

## 2019-07-18 ENCOUNTER — Other Ambulatory Visit: Payer: Self-pay

## 2019-07-18 ENCOUNTER — Encounter: Payer: Medicare Other | Admitting: Nurse Practitioner

## 2019-07-18 NOTE — Progress Notes (Signed)
   This service is provided via telemedicine  No vital signs collected/recorded due to the encounter was a telemedicine visit.   Location of patient (ex: home, work):  Home   Patient consents to a telephone visit:  Yes   Location of the provider (ex: office, home):  [facility], Office   Name of any referring provider:  N/A  Names of all persons participating in the telemedicine service and their role in the encounter:  S.Chrae B/CMA, Sherrie Mustache, NP, and Patient   Time spent on call: 6 min with medical assistant

## 2019-07-19 ENCOUNTER — Telehealth (INDEPENDENT_AMBULATORY_CARE_PROVIDER_SITE_OTHER): Payer: Medicare Other | Admitting: Nurse Practitioner

## 2019-07-19 ENCOUNTER — Other Ambulatory Visit: Payer: Self-pay

## 2019-07-19 ENCOUNTER — Telehealth: Payer: Medicare Other | Admitting: Nurse Practitioner

## 2019-07-19 ENCOUNTER — Encounter: Payer: Self-pay | Admitting: Nurse Practitioner

## 2019-07-19 ENCOUNTER — Other Ambulatory Visit: Payer: Self-pay | Admitting: Nurse Practitioner

## 2019-07-19 DIAGNOSIS — Z9989 Dependence on other enabling machines and devices: Secondary | ICD-10-CM

## 2019-07-19 DIAGNOSIS — I10 Essential (primary) hypertension: Secondary | ICD-10-CM

## 2019-07-19 DIAGNOSIS — G4733 Obstructive sleep apnea (adult) (pediatric): Secondary | ICD-10-CM

## 2019-07-19 DIAGNOSIS — B009 Herpesviral infection, unspecified: Secondary | ICD-10-CM

## 2019-07-19 MED ORDER — NEBULIZER/TUBING/MOUTHPIECE KIT: 1.0000 "application " | PACK | Freq: Four times a day (QID) | 11 refills | Status: AC | PRN

## 2019-07-19 MED ORDER — VALACYCLOVIR HCL 500 MG PO TABS: 500.0000 mg | ORAL_TABLET | Freq: Every day | ORAL | 1 refills | Status: DC

## 2019-07-19 MED ORDER — NEBULIZER/TUBING/MOUTHPIECE KIT: 1.0000 "application " | PACK | Freq: Four times a day (QID) | 11 refills | Status: DC | PRN

## 2019-07-19 NOTE — Progress Notes (Signed)
This service is provided via telemedicine  No vital signs collected/recorded due to the encounter was a telemedicine visit.   Location of patient (ex: home, work): Home  Patient consents to a telephone visit: Yes  Location of the provider (ex: office, home):  [place].   Name of any referring provider:  N/A  Names of all persons participating in the telemedicine service and their role in the encounter:  Patient, Grace [name], RMA, Sherrie Mustache, NP.    Time spent on call:  8 minutes spent on the phone with Medical Assistant.      Careteam: Patient Care Team: [name], NP as PCP - General (Geriatric Medicine) Leonie Man, MD as PCP - Cardiology (Cardiology) Tobi Bastos, RN as [place] Management  PLACE OF SERVICE:  [place] Directive information Does Patient Have a Medical Advance Directive?: Yes, Type of Advance Directive: Out of facility DNR (pink MOST or yellow form), Does patient want to make changes to medical advance directive?: No - Patient declined  Allergies  Allergen Reactions  . Amlodipine     Cramps   . Anoro Ellipta [Umeclidinium-Vilanterol] Other (See Comments)    Chest pain if used more than 3 consecutive days   . Codeine     REACTION: headaches  . Gabapentin     headache  . Prozac [Fluoxetine Hcl] Other (See Comments)    Suicidal ideations    Chief Complaint  Patient presents with  . Acute Visit    Medication Request      HPI: Patient is a 64 y.o. female via virtual visit.  Pt with hx of genital herpes (her husband had an affair and gave her several STDs that she had to be treated for). She had a few outbreaks after her original outbreak around stressful events but has not had outbreak. Last outbreak over 10 years ago.  Wants to be on preventative treatment due to possible new relationship and wants to be on the safe side.   Continues to do well with diet and increase in  activity  Would like nebulizer tube kit sent [facility] fax TO [phone]  Had first COVID vaccine, scheduled for the 2nd one.    Review of Systems:  Review of Systems  Constitutional: Negative for chills, fever and weight loss.  HENT: Negative for tinnitus.   Respiratory: Negative for cough, sputum production and shortness of breath.   Cardiovascular: Negative for chest pain, palpitations and leg swelling.  Gastrointestinal: Negative for abdominal pain, constipation, diarrhea and heartburn.  Genitourinary: Negative for dysuria, frequency and urgency.  Musculoskeletal: Negative for back pain, falls, joint pain and myalgias.  Skin: Negative.   Neurological: Negative for dizziness and headaches.    Past Medical History:  Diagnosis Date  . Abnormal vaginal bleeding   . Asthma   . Barrett's esophagus    with high grade dysplasia per endoscopy 05/2005 // followed by Dr. [name] ([facility])  . Chronic back pain   . COPD (chronic obstructive pulmonary disease) ([place])   . Diabetes mellitus without complication ([place])   . Duodenal ulcer disease    thought to be contributed at least partly by overuse of NSAIDs and salicylates (goody powdr)  . Endometrial polyp 03/2003   s/p resection in 03/2003. Path showing submucosal leiomyoma and benign  proliferative type endometrium  . Fibroid uterus    s/p myomectomy x 2  . GERD (gastroesophageal reflux disease)   . H/O  failed moderate sedation   . Hiatal hernia   . History of CVA (cerebrovascular accident) 10/2000  . 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 Hyperlipidemia   . Hypertension   . Hypothyroidism    with hx of multinodular goiter (noted on neck US in 11/2002 - showing diffuse nodularity and inhomogenous texture diffusely BL)  . Internal hemorrhoids    grade 2 per colonoscopy in 09/2006 - repeat colonoscopy rec in 5-10 years.  . Migraine   .  Nevus   . Personal history of failed conscious sedation 08/11/2011  . Tachycardia   . Thyroid disease   . Tobacco abuse Jan 2014   quit   Past Surgical History:  Procedure Laterality Date  . CHOLECYSTECTOMY    . COLONOSCOPY  02/07/2010  . HYSTEROSCOPY    . LAPAROSCOPIC APPENDECTOMY N/A 09/23/2015   Procedure: APPENDECTOMY LAPAROSCOPIC;  Surgeon: Georganna Skeans, MD;  Location: [facility];  Service: General;  Laterality: N/A;  . [place]   x 2 - In 1997, noted to have extensive pelvic adhesions and BL tubal obstruction  . TRANSTHORACIC ECHOCARDIOGRAM  06/2014    EF 55-60%.  No RWMA. Gr 1 DD. Cannot R/o PFO (consider bubble study).  Marland Kitchen UPPER GASTROINTESTINAL ENDOSCOPY  12/18/2009   Social History:   reports that she quit smoking about 4 years ago. Her smoking use included cigarettes. She has a 150.00 pack-year smoking history. She has never used smokeless tobacco. She reports that she does not drink alcohol or use drugs.  Family History  Problem Relation Age of Onset  . COPD Mother   . Heart disease Father 47       had 5 heart attacks, first in late 2s  . COPD Father     Medications: Patient's Medications  New Prescriptions   No medications on file  Previous Medications   ALBUTEROL (PROAIR HFA) 108 (90 BASE) MCG/ACT INHALER    INHALE 2 PUFFS EVERY 6  HOURS AS NEEDED FOR  SHORTNESS OF BREATH OR  WHEEZING   ALUM & MAG HYDROXIDE-SIMETH (MAALOX/MYLANTA) 200-200-20 MG/5ML SUSPENSION    Take by mouth every 6 (six) hours as needed for indigestion or heartburn.   ASPIRIN EC 81 MG TABLET    Take 81 mg by mouth daily.   ASPIRIN-ACETAMINOPHEN-CAFFEINE 638-177-11 MG PACK    Take 1 Package by mouth as needed.    ATORVASTATIN (LIPITOR) 80 MG TABLET    TAKE 1 TABLET BY MOUTH ONCE DAILY AT BEDTIME FOR  CHOLESTEROL   BIOTIN 5000 MCG TABS    Take by mouth daily.   BISOPROLOL (ZEBETA) 5 MG TABLET    One twice daily   FERROUS SULFATE 325 (65 FE) MG EC TABLET    Take 1 tablet (325 mg total)  by mouth daily. With Meal.   FLUTICASONE-SALMETEROL (ADVAIR HFA) 230-21 MCG/ACT INHALER    Inhale 2 puffs into the lungs 2 (two) times daily.   GLIPIZIDE (GLUCOTROL XL) 5 MG 24 HR TABLET    TAKE 1 TABLET BY MOUTH  DAILY WITH BREAKFAST   GLUCOSE BLOOD (ONETOUCH VERIO) TEST STRIP    Use to check blood sugar twice daily. Dx: E11.9   HYDRALAZINE (APRESOLINE) 50 MG TABLET    TAKE 1 TABLET BY MOUTH 3  TIMES DAILY   IBUPROFEN (ADVIL,MOTRIN) 200 MG TABLET    Take 800 mg by mouth as  needed for moderate pain.    IPRATROPIUM-ALBUTEROL (DUONEB) 0.5-2.5 (3) MG/3ML SOLN    Take 3 mLs by nebulization every 6 (six) hours as needed.   LANCETS (ONETOUCH DELICA PLUS [id number]) MISC    USE TO CHECK BLOOD SUGAR  TWICE DAILY   LEVOTHYROXINE (SYNTHROID) 137 MCG TABLET    TAKE 1 TABLET BY MOUTH  DAILY BEFORE BREAKFAST   LORAZEPAM (ATIVAN) 0.5 MG TABLET    TAKE 1 TABLET BY MOUTH 3 TIMES A DAY AS NEEDED FOR ANXIETY OR NERVOUSNESS   LOSARTAN (COZAAR) 100 MG TABLET    Take 1 tablet (100 mg total) by mouth daily.   MELATONIN 10 MG TABS    Take 1 tablet by mouth at bedtime.   MULTIPLE VITAMINS-MINERALS (HAIR/SKIN/NAILS) TABS    Take by mouth daily.   OMEPRAZOLE (PRILOSEC) 20 MG CAPSULE    Take 30- 60 min before your first and last meals of the day   RESPIRATORY THERAPY SUPPLIES (NEBULIZER/TUBING/MOUTHPIECE) KIT    1 application by Does not apply route every 6 (six) hours as needed.   SPIRIVA RESPIMAT 2.5 MCG/ACT AERS    USE 2 PUFFS BY MOUTH ONCE  DAILY   VALERIAN ROOT 500 MG CAPS    Take 2 capsules by mouth at bedtime as needed.   Modified Medications   No medications on file  Discontinued Medications   CHLORHEXIDINE (PERIDEX) 0.12 % SOLUTION    Use as directed 5 mLs in the mouth or throat 2 (two) times daily. Swish and spit twice daily    Physical Exam:  There were no vitals filed for this visit. There is no height or weight on file to calculate BMI. Wt Readings from Last 3 Encounters:  06/27/19 210 lb (95.3 kg)    05/06/19 213 lb (96.6 kg)  02/07/19 211 lb 9.6 oz (96 kg)      Labs reviewed: Basic Metabolic Panel: Recent Labs    01/26/19 0925 05/06/19 1208 06/27/19 1425  NA 141 141  --   K 4.1 4.3  --   CL 105 107  --   CO2 27 27  --   GLUCOSE 97 89  --   BUN 23 11  --   CREATININE 0.81 0.95  --   CALCIUM 9.3 9.4  --   TSH 0.14* 9.44* 0.93   Liver Function Tests: Recent Labs    01/26/19 0925  AST 15  ALT 13  BILITOT 0.3  PROT 6.6   No results for input(s): LIPASE, AMYLASE in the last 8760 hours. No results for input(s): AMMONIA in the last 8760 hours. CBC: Recent Labs    01/26/19 0925 02/07/19 1548 05/06/19 1208  WBC 8.0 10.3 8.3  NEUTROABS 6,176 7.3 5,652  HGB 11.4* 11.3* 11.3*  HCT 36.5 36.3 36.6  MCV 80.8 82.1 82.1  PLT 268 243.0 279   Lipid Panel: Recent Labs    01/26/19 0925  CHOL 126  HDL 53  LDLCALC 57  TRIG 80  CHOLHDL 2.4   TSH: Recent Labs    01/26/19 0925 05/06/19 1208 06/27/19 1425  TSH 0.14* 9.44* 0.93   A1C: Lab Results  Component Value Date   HGBA1C 6.5 (H) 05/06/2019     Assessment/Plan 1. HSV (herpes simplex virus) infection -no recent outbreak but would like to start suppressive therapy due to possible new relationship  - valACYclovir (VALTREX) 500 MG tablet; Take 1 tablet (500 mg total) by mouth daily.  Dispense: 90 tablet; Refill: 1  2. OSA on CPAP -  RX supplies needing to be sent to [facility] - Respiratory Therapy Supplies (NEBULIZER/TUBING/MOUTHPIECE) KIT; 1 application by Does not apply route every 6 (six) hours as needed. FAX TO [phone]  Dispense: 1 kit; Refill: [place]. Harle Battiest  [facility] [phone]   Virtual Visit via Video Note  I connected with Grace [name] on 07/19/19 at  2:15 PM EDT by a video enabled telemedicine application and verified that I am speaking with the correct person using two identifiers.  Location: Patient: home Provider: [facility]   I discussed the limitations of evaluation and management by telemedicine and the availability of in person appointments. The patient expressed understanding and agreed to proceed.    I discussed the assessment and treatment plan with the patient. The patient was provided an opportunity to ask questions and all were answered. The patient agreed with the plan and demonstrated an understanding of the instructions.   The patient was advised to call back or seek an in-person evaluation if the symptoms worsen or if the condition fails to improve as anticipated.  I provided 15 minutes of non-face-to-face time during this encounter.  Carlos American. Dewaine Oats, AGNP Avs printed and mailed.

## 2019-07-19 NOTE — Progress Notes (Signed)
This encounter was created in error - please disregard.

## 2019-07-22 ENCOUNTER — Other Ambulatory Visit: Payer: Self-pay | Admitting: *Deleted

## 2019-07-22 NOTE — Patient Outreach (Addendum)
[facility]) Care Management  07/22/2019  Grace [name] March 18, 1956 [id number]   Telephone Assessment-Successful-COPD  Spoke with pt who reports several changes since we last spoke several months ago. The following list of events pt has indulged in concerning her health and available use of resources.   1. Report she recently purchased a stationary bike that she has generated a workout regimen. States has increased her time spend exercising up to 20 minutes every other day using her resistance bands. Reports she has lose 11 lbs along with a change in her eating habits to more healthier recipes and foods.  2. Based upon the the exercising she reports before she was using her rescue inhaler when needed about q 4 hrs now further out if needed to q 6-8 hrs that she is able to last prior to use.  3. Pt states she will be participating in a COPD study for injections that ill py her $60 for her follow up visits. RN strongly encouraged pt to notify her provider prior to receiving such injections. States the pulmonologist with the study group will notify her pulmonologist and she has discuss this with her primary provider already.  4. Other supplements pt has received is her ongoing food stamps at $194 month which will probable end on June 1 but this has help a lot. Pt also mention she receive the stimulus check for $1400 and will be saving most of this for hard times. 5. Pt states she was able to find a dental company ([employer] [employer]) who was willing to work with pt on a payment plan. States she has had 2 teeth pulled pending another on 4/20 with $25 draft on monthly payments. Pt was very appreciative and grateful to find an agency to assist due to the signs of further infection upon the pulling of her tooth. Other dental work pending but pt is overall recovering very well.  Praise pt for her resources and offered moral support as her best advocate in cheering her forward as she indicated she  could wait to speak with this RN case manager today to report all the information mentioned above, she was so excited. RN was very supportive and informed pt to call anytime to report any news that she wishes to share on her accomplishments.   Review and discussed her ongoing plan of care along with goals and interventions that were adjusted based upon the pt's progress. Pt denies any COPD symptoms verifying she is in the GREEN zone today with no acute symptoms since our last conversations. Pt aware to use her inhalers and when to contact her providers with acute symptoms. Will continue to encourage adherence as she manages her ongoing medical issues in finding the  available resources to accommodate her needs. Will follow up in July as pt wants to remain on a quarterly follow up call.   THN CM Care Plan Problem One     Most Recent Value  Care Plan Problem One  Deficient Knowledge related to COPD unfamiliarity with information /education  Role Documenting the Problem One  Care Management Coordinator  Care Plan for Problem One  Active  THN Long Term Goal   Pt will verbalize the plan of aciton in the YELLOW zone within the next 60 days.  THN Long Term Goal Start Date  02/08/19  Interventions for Problem One Long Term Goal  Verify pt ocntinue to do well with managing her COPD with no acute symptoms. Pt able recite symptoms relted to COPD  action zone      [name], RN Care Management Coordinator [place] Office [phone]

## 2019-07-23 ENCOUNTER — Other Ambulatory Visit: Payer: Self-pay | Admitting: Nurse Practitioner

## 2019-07-23 DIAGNOSIS — I10 Essential (primary) hypertension: Secondary | ICD-10-CM

## 2019-07-26 ENCOUNTER — Ambulatory Visit: Payer: Medicare Other | Admitting: Nurse Practitioner

## 2019-08-16 ENCOUNTER — Encounter: Payer: Self-pay | Admitting: Nurse Practitioner

## 2019-08-16 DIAGNOSIS — N952 Postmenopausal atrophic vaginitis: Secondary | ICD-10-CM

## 2019-08-16 NOTE — Telephone Encounter (Signed)
Message routed to pcp [name], NP . Please Advise.

## 2019-08-19 MED ORDER — ESTRADIOL 0.1 MG/GM VA CREA: 1.0000 | TOPICAL_CREAM | Freq: Every day | VAGINAL | 12 refills | Status: AC

## 2019-08-23 NOTE — Telephone Encounter (Signed)
Open in error

## 2019-09-02 ENCOUNTER — Other Ambulatory Visit: Payer: Self-pay | Admitting: Nurse Practitioner

## 2019-09-02 DIAGNOSIS — E1140 Type 2 diabetes mellitus with diabetic neuropathy, unspecified: Secondary | ICD-10-CM

## 2019-09-04 ENCOUNTER — Encounter: Payer: Self-pay | Admitting: Nurse Practitioner

## 2019-09-20 ENCOUNTER — Encounter: Payer: Self-pay | Admitting: Nurse Practitioner

## 2019-09-21 ENCOUNTER — Other Ambulatory Visit: Payer: Self-pay

## 2019-09-21 ENCOUNTER — Encounter: Payer: Self-pay | Admitting: Nurse Practitioner

## 2019-09-21 ENCOUNTER — Ambulatory Visit (INDEPENDENT_AMBULATORY_CARE_PROVIDER_SITE_OTHER): Payer: Medicare Other | Admitting: Nurse Practitioner

## 2019-09-21 VITALS — BP 128/82 | HR 81 | Temp 96.9°F | Ht 63.0 in | Wt 196.0 lb

## 2019-09-21 DIAGNOSIS — G8929 Other chronic pain: Secondary | ICD-10-CM

## 2019-09-21 DIAGNOSIS — K1120 Sialoadenitis, unspecified: Secondary | ICD-10-CM

## 2019-09-21 DIAGNOSIS — M545 Low back pain, unspecified: Secondary | ICD-10-CM

## 2019-09-21 MED ORDER — METHOCARBAMOL 500 MG PO TABS: 500.0000 mg | ORAL_TABLET | Freq: Four times a day (QID) | ORAL | 0 refills | Status: DC | PRN

## 2019-09-21 MED ORDER — AMOXICILLIN-POT CLAVULANATE 875-125 MG PO TABS: 1.0000 | ORAL_TABLET | Freq: Two times a day (BID) | ORAL | 0 refills | Status: DC
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
## 2019-09-21 NOTE — Patient Instructions (Addendum)
To take Augmentin by mouth twice daily with full glass of water Increase hydration   For back Use aleve by mouth twice daily (no ibuprofen) for 1 week Heating pad to back three times daily routinely Muscle rub to back after heat  Keep follow up in 1 week    Parotitis  Parotitis means that you have irritation and swelling (inflammation) in one or both of your parotid glands. These glands make saliva. They are found on each side of your face, below and in front of your earlobes. You may or may not have pain with this condition. What are the causes? This condition may be caused by:  Infections from germs (bacteria or viruses).  Something blocking the flow of saliva through the parotid glands. This can be a stone, scar tissue, or a tumor.  Diseases that cause your body's defense system (immune system) to attack healthy cells in your salivary glands. These are called autoimmune diseases. What increases the risk? You are more likely to get this condition if:  You are 10 years old or older.  You do not drink enough fluids (are dehydrated).  You drink too much alcohol.  You have: ? A dry mouth. ? Diabetes. ? Gout. ? A long-term illness.  You do not take good care of your mouth and teeth (poor dental hygiene).  You have had radiation treatments to the head and neck.  You take certain medicines. What are the signs or symptoms? Symptoms of this condition depend on the cause. They may include:  Swelling under and in front of the ear. This may get worse after you eat.  Redness of the skin over the parotid gland.  Pain and tenderness over the parotid gland. This may get worse after you eat.  Fever or chills.  Pus coming from the ducts inside the mouth.  Dry mouth.  A bad taste in the mouth. How is this treated? Treatment for this condition depends on the cause. Treatment may include:  Antibiotic medicine for an infection from bacteria.  Drinking more  fluids.  Removing a stone or obstruction.  Treating a disease that is causing parotitis.  Surgery to drain an infection, remove a growth, or remove the whole gland. Treatment may not be needed if the swelling goes away with home care. Follow these instructions at home: Medicines   Take over-the-counter and prescription medicines only as told by your doctor.  If you were prescribed an antibiotic medicine, take it as told by your doctor. Do not stop taking the antibiotic even if you start to feel better. Managing pain and swelling  If told, put heat on the affected area. Do this as often as told by your doctor. Use the heat source that your doctor recommends, such as a moist heat pack or a heating pad. ? Place a towel between your skin and the heat source. ? Leave the heat on for 20-30 minutes. ? Remove the heat if your skin turns bright red. This is very important if you are unable to feel pain, heat, or cold. You may have a greater risk of getting burned.  Gargle with salt water 3-4 times a day or as needed. To make salt water, dissolve -1 tsp (3-6 g) of salt in 1 cup (237 mL) of warm water.  Gently rub your parotid glands as told by your doctor. General instructions   Drink enough fluid to keep your pee (urine) pale yellow.  Keep your mouth clean and moist.  Suck on sour  candy. This may help to: ? Make your mouth less dry. ? Make more saliva.  Take good care of your mouth: ? Brush your teeth at least two times a day. ? Floss your teeth every day. ? See your dentist regularly.  Do not use any products that contain nicotine or tobacco. These include cigarettes, e-cigarettes, and chewing tobacco. If you need help quitting, ask your doctor.  Do not drink alcohol.  Keep all follow-up visits as told by your doctor. This is important. Contact a doctor if:  You have a fever or chills.  You have new symptoms.  Your symptoms get worse.  Your symptoms do not get better  with treatment. Get help right away if:  You have trouble breathing or swallowing. Summary  Parotitis means that you have irritation and swelling (inflammation) in one or both of your parotid glands.  Symptoms include pain and swelling under and in front of the ear.  Treatment for parotitis depends on the cause. In some cases, the condition may go away on its own with home care.  You should drink plenty of fluids, take good care of your mouth, and avoid tobacco products. This information is not intended to replace advice given to you by your health care provider. Make sure you discuss any questions you have with your health care provider. Document Revised: 10/27/2017 Document Reviewed: 10/27/2017 Elsevier Patient Education  [employer].

## 2019-09-22 ENCOUNTER — Encounter: Payer: Self-pay | Admitting: Nurse Practitioner

## 2019-09-28 ENCOUNTER — Encounter: Payer: Self-pay | Admitting: Nurse Practitioner

## 2019-09-28 ENCOUNTER — Ambulatory Visit (INDEPENDENT_AMBULATORY_CARE_PROVIDER_SITE_OTHER): Payer: Medicare Other | Admitting: Nurse Practitioner

## 2019-09-28 ENCOUNTER — Other Ambulatory Visit: Payer: Self-pay

## 2019-09-28 VITALS — BP 122/78 | HR 83 | Temp 96.9°F | Ht 63.0 in | Wt 194.0 lb

## 2019-09-28 DIAGNOSIS — E034 Atrophy of thyroid (acquired): Secondary | ICD-10-CM | POA: Diagnosis not present

## 2019-09-28 DIAGNOSIS — G4700 Insomnia, unspecified: Secondary | ICD-10-CM

## 2019-09-28 DIAGNOSIS — N952 Postmenopausal atrophic vaginitis: Secondary | ICD-10-CM

## 2019-09-28 DIAGNOSIS — G8929 Other chronic pain: Secondary | ICD-10-CM

## 2019-09-28 DIAGNOSIS — E1140 Type 2 diabetes mellitus with diabetic neuropathy, unspecified: Secondary | ICD-10-CM | POA: Diagnosis not present

## 2019-09-28 DIAGNOSIS — E785 Hyperlipidemia, unspecified: Secondary | ICD-10-CM

## 2019-09-28 DIAGNOSIS — J449 Chronic obstructive pulmonary disease, unspecified: Secondary | ICD-10-CM

## 2019-09-28 DIAGNOSIS — K1120 Sialoadenitis, unspecified: Secondary | ICD-10-CM

## 2019-09-28 DIAGNOSIS — G43009 Migraine without aura, not intractable, without status migrainosus: Secondary | ICD-10-CM

## 2019-09-28 DIAGNOSIS — M545 Low back pain, unspecified: Secondary | ICD-10-CM

## 2019-09-28 DIAGNOSIS — I10 Essential (primary) hypertension: Secondary | ICD-10-CM | POA: Diagnosis not present

## 2019-09-28 DIAGNOSIS — J4489 Other specified chronic obstructive pulmonary disease: Secondary | ICD-10-CM

## 2019-09-28 DIAGNOSIS — F419 Anxiety disorder, unspecified: Secondary | ICD-10-CM

## 2019-09-28 NOTE — Patient Instructions (Addendum)
[facility] [phone] Address: [address], [place], [place] [address]  Keep up the good work with increase in exercise and eating better
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
## 2019-09-29 LAB — COMPLETE METABOLIC PANEL WITH GFR
AG Ratio: 1.5 (calc) (ref 1.0–2.5)
ALT: 19 U/L (ref 6–29)
AST: 19 U/L (ref 10–35)
Albumin: 4 g/dL (ref 3.6–5.1)
Alkaline phosphatase (APISO): 69 U/L (ref 37–153)
BUN: 14 mg/dL (ref 7–25)
CO2: 25 mmol/L (ref 20–32)
Calcium: 9 mg/dL (ref 8.6–10.4)
Chloride: 106 mmol/L (ref 98–110)
Creat: 0.76 mg/dL (ref 0.50–0.99)
GFR, Est African American: 96 mL/min/{1.73_m2} (ref >=60)
GFR, Est Non African American: 83 mL/min/{1.73_m2} (ref >=60)
Globulin: 2.7 g/dL (calc) (ref 1.9–3.7)
Glucose, Bld: 98 mg/dL (ref 65–99)
Potassium: 4.4 mmol/L (ref 3.5–5.3)
Sodium: 141 mmol/L (ref 135–146)
Total Bilirubin: 0.2 mg/dL (ref 0.2–1.2)
Total Protein: 6.7 g/dL (ref 6.1–8.1)

## 2019-09-29 LAB — LIPID PANEL
Cholesterol: 118 mg/dL (ref <200)
HDL: 45 mg/dL — ABNORMAL LOW (ref >=50)
LDL Cholesterol (Calc): 56 mg/dL (calc)
Non-HDL Cholesterol (Calc): 73 mg/dL (calc) (ref <130)
Total CHOL/HDL Ratio: 2.6 (calc) (ref <5.0)
Triglycerides: 90 mg/dL (ref <150)

## 2019-09-29 LAB — HEMOGLOBIN A1C
Hgb A1c MFr Bld: 6 % of total Hgb — ABNORMAL HIGH (ref <5.7)
Mean Plasma Glucose: 126 (calc)
eAG (mmol/L): 7 (calc)

## 2019-09-29 LAB — CBC WITH DIFFERENTIAL/PLATELET
Absolute Monocytes: 642 cells/uL (ref 200–950)
Basophils Absolute: 26 cells/uL (ref 0–200)
Basophils Relative: 0.3 %
Eosinophils Absolute: 299 cells/uL (ref 15–500)
Eosinophils Relative: 3.4 %
HCT: 37.7 % (ref 35.0–45.0)
Hemoglobin: 11.8 g/dL (ref 11.7–15.5)
Lymphs Abs: 1778 cells/uL (ref 850–3900)
MCH: 26.9 pg — ABNORMAL LOW (ref 27.0–33.0)
MCHC: 31.3 g/dL — ABNORMAL LOW (ref 32.0–36.0)
MCV: 85.9 fL (ref 80.0–100.0)
MPV: 11.3 fL (ref 7.5–12.5)
Monocytes Relative: 7.3 %
Neutro Abs: 6054 cells/uL (ref 1500–7800)
Neutrophils Relative %: 68.8 %
Platelets: 277 10*3/uL (ref 140–400)
RBC: 4.39 10*6/uL (ref 3.80–5.10)
RDW: 15.3 % — ABNORMAL HIGH (ref 11.0–15.0)
Total Lymphocyte: 20.2 %
WBC: 8.8 10*3/uL (ref 3.8–10.8)

## 2019-09-29 LAB — TSH: TSH: 0.54 mIU/L (ref 0.40–4.50)

## 2019-10-13 ENCOUNTER — Other Ambulatory Visit: Payer: Self-pay | Admitting: *Deleted

## 2019-10-14 ENCOUNTER — Encounter: Payer: Self-pay | Admitting: Nurse Practitioner

## 2019-10-14 ENCOUNTER — Ambulatory Visit (INDEPENDENT_AMBULATORY_CARE_PROVIDER_SITE_OTHER): Payer: Medicare Other | Admitting: Nurse Practitioner

## 2019-10-14 ENCOUNTER — Other Ambulatory Visit: Payer: Self-pay

## 2019-10-14 VITALS — BP 122/80 | HR 81 | Temp 96.9°F | Ht 63.0 in | Wt 195.8 lb

## 2019-10-14 DIAGNOSIS — L089 Local infection of the skin and subcutaneous tissue, unspecified: Secondary | ICD-10-CM | POA: Diagnosis not present

## 2019-10-14 MED ORDER — DOXYCYCLINE HYCLATE 100 MG PO TABS: 100.0000 mg | ORAL_TABLET | Freq: Two times a day (BID) | ORAL | 0 refills | Status: DC

## 2019-10-14 NOTE — Progress Notes (Signed)
Careteam: Patient Care Team: [name], NP as PCP - General (Geriatric Medicine) Leonie Man, MD as PCP - Cardiology (Cardiology) Tobi Bastos, RN as [place] Management  PLACE OF SERVICE:  [place] Directive information    Allergies  Allergen Reactions  . Amlodipine     Cramps   . Anoro Ellipta [Umeclidinium-Vilanterol] Other (See Comments)    Chest pain if used more than 3 consecutive days   . Codeine     REACTION: headaches  . Gabapentin     headache  . Prozac [Fluoxetine Hcl] Other (See Comments)    Suicidal ideations    Chief Complaint  Patient presents with  . Nail Problem    Examine thumb nail on right hand, patient questions a possible infection. Patient thought she had a hang nail x 3 days ago. Thumb is painful   . Shoulder Pain    Right shoulder pain      HPI: Patient is a 64 y.o. female for evaluation of thumb pain.  Reports she started having pain ~3 days ago, thought it was a hangnail so she tried to cut around it. Bleeding and some puss out. Thumb is throbbing and now arm is.  Looked like a blood blister with purulent drainage.  No fever or chills  Review of Systems:  Review of Systems  Constitutional: Negative for chills and fever.  Musculoskeletal: Positive for myalgias.  Skin: Negative for itching and rash.    Past Medical History:  Diagnosis Date  . Abnormal vaginal bleeding   . Asthma   . Barrett's esophagus    with high grade dysplasia per endoscopy 05/2005 // followed by Dr. [name] ([facility])  . Chronic back pain   . COPD (chronic obstructive pulmonary disease) ([place])   . Diabetes mellitus without complication ([place])   . Duodenal ulcer disease    thought to be contributed at least partly by overuse of NSAIDs and salicylates (goody powdr)  . Endometrial polyp 03/2003   s/p resection in 03/2003. Path showing submucosal leiomyoma and benign  proliferative type endometrium  . Fibroid  uterus    s/p myomectomy x 2  . GERD (gastroesophageal reflux disease)   . H/O failed moderate sedation   . Hiatal hernia   . History of CVA (cerebrovascular accident) 10/2000  . 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 Hyperlipidemia   . Hypertension   . Hypothyroidism    with hx of multinodular goiter (noted on neck US in 11/2002 - showing diffuse nodularity and inhomogenous texture diffusely BL)  . Internal hemorrhoids    grade 2 per colonoscopy in 09/2006 - repeat colonoscopy rec in 5-10 years.  . Migraine   . Nevus   . Personal history of failed conscious sedation 08/11/2011  . Tachycardia   . Thyroid disease   . Tobacco abuse Jan 2014   quit   Past Surgical History:  Procedure Laterality Date  . CHOLECYSTECTOMY    . COLONOSCOPY  02/07/2010  . HYSTEROSCOPY    . LAPAROSCOPIC APPENDECTOMY N/A 09/23/2015   Procedure: APPENDECTOMY LAPAROSCOPIC;  Surgeon: Georganna Skeans, MD;  Location: [facility];  Service: General;  Laterality: N/A;  . [place]   x 2 - In 1997, noted to have extensive pelvic adhesions and BL tubal obstruction  . TRANSTHORACIC ECHOCARDIOGRAM  06/2014  EF 55-60%.  No RWMA. Gr 1 DD. Cannot R/o PFO (consider bubble study).  Marland Kitchen UPPER GASTROINTESTINAL ENDOSCOPY  12/18/2009   Social History:   reports that she quit smoking about 4 years ago. Her smoking use included cigarettes. She has a 150.00 pack-year smoking history. She has never used smokeless tobacco. She reports that she does not drink alcohol and does not use drugs.  Family History  Problem Relation Age of Onset  . COPD Mother   . Heart disease Father 73       had 5 heart attacks, first in late 20s  . COPD Father     Medications: Patient's Medications  New Prescriptions   No medications on file  Previous Medications   ALBUTEROL (PROAIR HFA) 108 (90 BASE) MCG/ACT INHALER    INHALE 2 PUFFS EVERY 6  HOURS  AS NEEDED FOR  SHORTNESS OF BREATH OR  WHEEZING   ALUM & MAG HYDROXIDE-SIMETH (MAALOX/MYLANTA) 200-200-20 MG/5ML SUSPENSION    Take by mouth every 6 (six) hours as needed for indigestion or heartburn.   ASPIRIN EC 81 MG TABLET    Take 81 mg by mouth daily.   ASPIRIN-ACETAMINOPHEN-CAFFEINE 967-591-63 MG PACK    Take 1 Package by mouth as needed.    ATORVASTATIN (LIPITOR) 80 MG TABLET    TAKE 1 TABLET BY MOUTH ONCE DAILY AT BEDTIME FOR  CHOLESTEROL   BIOTIN 5000 MCG TABS    Take by mouth daily.   BISOPROLOL (ZEBETA) 5 MG TABLET    One twice daily   ESTRADIOL (ESTRACE VAGINAL) 0.1 MG/GM VAGINAL CREAM    Place 1 Applicatorful vaginally at bedtime.   FERROUS SULFATE 325 (65 FE) MG EC TABLET    Take 1 tablet (325 mg total) by mouth daily. With Meal.   FLUTICASONE-SALMETEROL (ADVAIR HFA) 230-21 MCG/ACT INHALER    Inhale 2 puffs into the lungs 2 (two) times daily.   GLIPIZIDE (GLUCOTROL XL) 5 MG 24 HR TABLET    TAKE 1 TABLET BY MOUTH  DAILY WITH BREAKFAST   GLUCOSE BLOOD (ONETOUCH VERIO) TEST STRIP    Use to check blood sugar twice daily. Dx: E11.9   HYDRALAZINE (APRESOLINE) 50 MG TABLET    TAKE 1 TABLET BY MOUTH 3  TIMES DAILY   IBUPROFEN (ADVIL,MOTRIN) 200 MG TABLET    Take 600 mg by mouth as needed for moderate pain.   IPRATROPIUM-ALBUTEROL (DUONEB) 0.5-2.5 (3) MG/3ML SOLN    Take 3 mLs by nebulization every 6 (six) hours as needed.   LANCETS (ONETOUCH DELICA PLUS [id number]) MISC    USE TO CHECK BLOOD SUGAR  TWICE DAILY   LEVOTHYROXINE (SYNTHROID) 137 MCG TABLET    TAKE 1 TABLET BY MOUTH  DAILY BEFORE BREAKFAST   LORAZEPAM (ATIVAN) 0.5 MG TABLET    TAKE 1 TABLET BY MOUTH 3 TIMES A DAY AS NEEDED FOR ANXIETY OR NERVOUSNESS   LOSARTAN (COZAAR) 100 MG TABLET    TAKE 1 TABLET BY MOUTH  DAILY   MELATONIN 10 MG TABS    Take 1 tablet by mouth at bedtime.   METHOCARBAMOL (ROBAXIN) 500 MG TABLET    Take 1 tablet (500 mg total) by mouth every 6 (six) hours as needed for muscle spasms.   MULTIPLE  VITAMINS-MINERALS (HAIR/SKIN/NAILS) TABS    Take by mouth daily.   OMEPRAZOLE (PRILOSEC) 20 MG CAPSULE    Take 30- 60 min before your first and last meals of the day   RESPIRATORY THERAPY SUPPLIES (NEBULIZER/TUBING/MOUTHPIECE) KIT    1 application  by Does not apply route every 6 (six) hours as needed. FAX TO [phone]   SPIRIVA RESPIMAT 2.5 MCG/ACT AERS    USE 2 PUFFS BY MOUTH ONCE  DAILY   VALACYCLOVIR (VALTREX) 500 MG TABLET    Take 1 tablet (500 mg total) by mouth daily.   VALERIAN ROOT 500 MG CAPS    Take 2 capsules by mouth at bedtime as needed.   Modified Medications   No medications on file  Discontinued Medications   AMOXICILLIN-CLAVULANATE (AUGMENTIN) 875-125 MG TABLET    Take 1 tablet by mouth 2 (two) times daily.    Physical Exam:  Vitals:   10/14/19 1539  BP: 122/80  Pulse: 81  Temp: (!) 96.9 F (36.1 C)  TempSrc: Temporal  SpO2: 98%  Weight: 195 lb 12.8 oz (88.8 kg)  Height: '5\' 3"'$  (1.6 m)   Body mass index is 34.68 kg/m. Wt Readings from Last 3 Encounters:  10/14/19 195 lb 12.8 oz (88.8 kg)  09/28/19 194 lb (88 kg)  09/21/19 196 lb (88.9 kg)    Physical Exam Constitutional:      Appearance: Normal appearance.  Musculoskeletal:       Hands:     Comments: Right thumb nail with increased tenderness, redness around nail bed and to right side. Skin tissue separation noted.  Skin:    General: Skin is warm and dry.  Neurological:     General: No focal deficit present.     Mental Status: She is alert and oriented to person, place, and time.  Psychiatric:        Mood and Affect: Mood normal.        Behavior: Behavior normal.     Labs reviewed: Basic Metabolic Panel: Recent Labs    01/26/19 0925 01/26/19 0925 05/06/19 1208 06/27/19 1425 09/28/19 1211  NA 141  --  141  --  141  K 4.1  --  4.3  --  4.4  CL 105  --  107  --  106  CO2 27  --  27  --  25  GLUCOSE 97  --  89  --  98  BUN 23  --  11  --  14  CREATININE 0.81  --  0.95  --  0.76    CALCIUM 9.3  --  9.4  --  9.0  TSH 0.14*   < > 9.44* 0.93 0.54   < > = values in this interval not displayed.   Liver Function Tests: Recent Labs    01/26/19 0925 09/28/19 1211  AST 15 19  ALT 13 19  BILITOT 0.3 0.2  PROT 6.6 6.7   No results for input(s): LIPASE, AMYLASE in the last 8760 hours. No results for input(s): AMMONIA in the last 8760 hours. CBC: Recent Labs    02/07/19 1548 05/06/19 1208 09/28/19 1211  WBC 10.3 8.3 8.8  NEUTROABS 7.3 5,652 6,054  HGB 11.3* 11.3* 11.8  HCT 36.3 36.6 37.7  MCV 82.1 82.1 85.9  PLT 243.0 279 277   Lipid Panel: Recent Labs    01/26/19 0925 09/28/19 1211  CHOL 126 118  HDL 53 45*  LDLCALC 57 56  TRIG 80 90  CHOLHDL 2.4 2.6   TSH: Recent Labs    05/06/19 1208 06/27/19 1425 09/28/19 1211  TSH 9.44* 0.93 0.54   A1C: Lab Results  Component Value Date   HGBA1C 6.0 (H) 09/28/2019     Assessment/Plan 1. Infected finger -to continue epsom salt soak TID ~20  mins - doxycycline (VIBRA-TABS) 100 MG tablet; Take 1 tablet (100 mg total) by mouth 2 (two) times daily.  Dispense: 14 tablet; Refill: 0  To notify if symptoms worsen or fail to improve  Brittainy Bucker K. [place], [facility] [phone]

## 2019-10-14 NOTE — Patient Instructions (Signed)
Get florastor to take twice daily Start yogurt daily  Continue to soak three times daily, warm epsom salt Doxycyline twice daily for 1 week.

## 2019-10-21 ENCOUNTER — Other Ambulatory Visit: Payer: Self-pay | Admitting: *Deleted

## 2019-10-21 NOTE — Patient Outreach (Signed)
[facility]) Care Management  10/21/2019  KERISHA GOUGHNOUR 29-Jan-1956 [id number]   Telephone Assessment  RN attempted outreach call however unsuccessful. RN able to leave a HIPAA approved voice message requesting a call back.   Plan: Will rescheduled another call back next week for ongoing [facility] services.  [name], RN Care Management Coordinator [place] Office [phone]

## 2019-10-28 ENCOUNTER — Other Ambulatory Visit: Payer: Self-pay | Admitting: *Deleted

## 2019-10-28 NOTE — Patient Outreach (Signed)
[facility]) Care Management  10/28/2019  Grace [name] 11/10/1955 [id number]   Telephone Assessment-Transition to Health Coach for ongoing COPD management.   RN spoke with pt who was receptive to transitioning to  [employer] for ongoing management of care in managing her COPD. States she will add exercises to her ongoing management of care (sationary bike).  Goal review as pt states she continue to manager her care however slow to began exercises. RN discussed Silver Sneakers via local gym and short intervals with examples of M-W-F or 10-15 minutes on her bike to start if tolerable. Pt will attempt this task but overall indicates she is doing well. Review all medications and verified pt continue to have all her prescribed inhalers and medications with no needed refills. Pt continue to drive herself to all medical appointments with no delays. Pt remains in the GRE[place] as she is aware of what to do if acute symptoms occur by contact her provider if no resolution with her medications. Will continue to stress the the importance of earlier interventions in managing her COPD to prevent admission or acute symptoms. Pt will continue to adhere to the current plan of care and receptive to transitioning to a Health Coach for possibly quarterly follow up calls.   Plan: Will update pt's provider and refer to a Health Coach for ongoing COPD management.  [name], RN Care Management Coordinator [place] Office [phone]

## 2019-11-04 ENCOUNTER — Other Ambulatory Visit: Payer: Self-pay | Admitting: Internal Medicine

## 2019-11-04 DIAGNOSIS — K22719 Barrett's esophagus with dysplasia, unspecified: Secondary | ICD-10-CM

## 2019-11-06 ENCOUNTER — Encounter: Payer: Self-pay | Admitting: Nurse Practitioner

## 2019-11-07 NOTE — Telephone Encounter (Signed)
No available appointment on Wednesday.

## 2019-11-09 ENCOUNTER — Encounter: Payer: Self-pay | Admitting: Nurse Practitioner

## 2019-11-09 DIAGNOSIS — B009 Herpesviral infection, unspecified: Secondary | ICD-10-CM | POA: Insufficient documentation

## 2019-11-09 NOTE — Telephone Encounter (Signed)
Received Paperwork For [employer] for Participation in the Plasmapheresis Program.  Filled out and attached medication list and placed in [place] folder to review and sign.

## 2019-11-17 ENCOUNTER — Ambulatory Visit: Payer: Self-pay | Admitting: *Deleted

## 2019-11-20 ENCOUNTER — Other Ambulatory Visit: Payer: Self-pay | Admitting: Internal Medicine

## 2019-11-20 DIAGNOSIS — K22719 Barrett's esophagus with dysplasia, unspecified: Secondary | ICD-10-CM

## 2019-11-22 ENCOUNTER — Encounter: Payer: Self-pay | Admitting: *Deleted

## 2019-11-22 ENCOUNTER — Other Ambulatory Visit: Payer: Self-pay | Admitting: *Deleted

## 2019-11-22 ENCOUNTER — Other Ambulatory Visit: Payer: Self-pay | Admitting: Nurse Practitioner

## 2019-11-22 DIAGNOSIS — B009 Herpesviral infection, unspecified: Secondary | ICD-10-CM

## 2019-11-22 NOTE — Patient Outreach (Signed)
[facility]) Care Management  11/22/2019  Grace [name] Oct 02, 1955 [id number]  Unsuccessful outreach attempt made to patient. RN Health Coach left HIPAA compliant voicemail message along with her contact information.  Plan: RN Health Coach will call patient within the month of September.  Emelia Loron RN, BSN [place] [phone] [email or web address]

## 2019-11-26 ENCOUNTER — Other Ambulatory Visit: Payer: Self-pay | Admitting: Internal Medicine

## 2019-11-29 ENCOUNTER — Other Ambulatory Visit: Payer: Self-pay | Admitting: Nurse Practitioner

## 2019-12-20 DIAGNOSIS — J449 Chronic obstructive pulmonary disease, unspecified: Secondary | ICD-10-CM | POA: Diagnosis not present

## 2019-12-20 DIAGNOSIS — E039 Hypothyroidism, unspecified: Secondary | ICD-10-CM | POA: Diagnosis not present

## 2019-12-20 DIAGNOSIS — Z79899 Other long term (current) drug therapy: Secondary | ICD-10-CM | POA: Diagnosis not present

## 2019-12-20 DIAGNOSIS — Q211 Atrial septal defect: Secondary | ICD-10-CM | POA: Diagnosis not present

## 2019-12-21 ENCOUNTER — Ambulatory Visit: Payer: Medicare Other | Admitting: Pulmonary Disease

## 2019-12-23 ENCOUNTER — Other Ambulatory Visit: Payer: Self-pay | Admitting: *Deleted

## 2019-12-23 NOTE — Patient Outreach (Addendum)
[facility]) Care Management  12/23/2019  Grace [name] [birth year] [id number]  Successful telephone outreach call to patient. HIPAA identifiers obtained. Patient informed nurse that she recently changed her PCP to Dr. Andree Moro from [facility] who is not a provider for [facility]. Nurse ensured that the patient did not have any concerning wellness or health needs. Then explained that her new PCP is out of [place] and as a result this nurse would not be able to continue doing telephone outreaches to her. Patient verbalized understanding.   Plan: RN Health Coach will close case.  Emelia Loron RN, BSN [place] [phone] [name]

## 2019-12-30 ENCOUNTER — Other Ambulatory Visit: Payer: Self-pay | Admitting: General Practice

## 2019-12-30 DIAGNOSIS — Z1231 Encounter for screening mammogram for malignant neoplasm of breast: Secondary | ICD-10-CM

## 2020-01-04 ENCOUNTER — Ambulatory Visit: Payer: Medicare Other | Admitting: Pulmonary Disease

## 2020-01-04 ENCOUNTER — Encounter: Payer: Self-pay | Admitting: Pulmonary Disease

## 2020-01-04 ENCOUNTER — Other Ambulatory Visit: Payer: Self-pay

## 2020-01-04 VITALS — BP 132/82 | HR 80 | Temp 97.1°F | Ht 63.0 in | Wt 190.2 lb

## 2020-01-04 DIAGNOSIS — G4733 Obstructive sleep apnea (adult) (pediatric): Secondary | ICD-10-CM | POA: Diagnosis not present

## 2020-01-04 DIAGNOSIS — J449 Chronic obstructive pulmonary disease, unspecified: Secondary | ICD-10-CM | POA: Diagnosis not present

## 2020-01-04 DIAGNOSIS — Z9989 Dependence on other enabling machines and devices: Secondary | ICD-10-CM

## 2020-01-04 MED ORDER — SPIRIVA RESPIMAT 2.5 MCG/ACT IN AERS: INHALATION_SPRAY | RESPIRATORY_TRACT | 3 refills | Status: DC

## 2020-01-04 MED ORDER — ADVAIR HFA 230-21 MCG/ACT IN AERO: 2.0000 | INHALATION_SPRAY | Freq: Two times a day (BID) | RESPIRATORY_TRACT | 6 refills | Status: DC

## 2020-01-04 NOTE — Progress Notes (Signed)
Subjective:   PATIENT ID: Grace [name] GENDER: female DOB: 02-Jun-1955, MRN: [id number]   HPI  Chief Complaint  Patient presents with  . Follow-up    Patient is doing good since last visit. She has been exercising for 20 minutes a day and has not used her in a week.  Still has some shortness of breath but recovers quickly    Reason for Visit: Follow-up. Former patient of Dr. Melvyn Novas.  Ms. Grace [name] is a 64 year old female with very severe COPD/asthma who presents for follow-up.  She is compliant with her Advair and Spiriva Respimat. Reports her shortness of breath is unchanged. But able to recover more quickly than in the past. She was regularly using her albuterol with activity but now she does not need it as often lately nor has she needed her nebulizer. Rarely coughs or wheezes. She reports weight loss from 214 to 190 lbs by reducing her soda intake and started using an exercise bicycle for 20 minutes for most days of the week and resistance training. She is now able to complete household tasks without shortness of breath. She had questions whether she qualified for [facility] but since she is improving she is not sure if she would benefit.   She has not used her CPAP for five months since being off the sleep study. She wakes herself up snoring, has had less energy and having morning headaches.   Social History: She lives with 7 indoor cats. Wears a mask when changing litter.  I have personally reviewed patient's past medical/family/social history/allergies/current medications.  Past Medical History:  Diagnosis Date  . Abnormal vaginal bleeding   . Asthma   . Barrett's esophagus    with high grade dysplasia per endoscopy 05/2005 // followed by Dr. [name] ([facility])  . Chronic back pain   . COPD (chronic obstructive pulmonary disease) ([place])   . Diabetes mellitus without complication ([place])   . Duodenal ulcer disease    thought to be contributed at least partly by overuse of  NSAIDs and salicylates (goody powdr)  . Endometrial polyp 03/2003   s/p resection in 03/2003. Path showing submucosal leiomyoma and benign  proliferative type endometrium  . Family history of premature CAD 05/19/2018  . Fibroid uterus    s/p myomectomy x 2  . GERD (gastroesophageal reflux disease)   . H/O failed moderate sedation   . Hiatal hernia   . History of CVA (cerebrovascular accident) 10/2000  . 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 Hyperlipidemia   . Hypertension   . Hypothyroidism    with hx of multinodular goiter (noted on neck US in 11/2002 - showing diffuse nodularity and inhomogenous texture diffusely BL)  . Internal hemorrhoids    grade 2 per colonoscopy in 09/2006 - repeat colonoscopy rec in 5-10 years.  . Migraine   . Nevus   . Personal history of failed conscious sedation 08/11/2011  . Tachycardia   . Thyroid disease   . Tobacco abuse Jan 2014   quit     Family History  Problem Relation Age of Onset  . COPD Mother   . Heart disease Father 38       had 5 heart attacks, first in late 58s  . COPD Father      Social History   Occupational History  . Occupation: Electronic record  Comment: [employer]  Tobacco Use  . Smoking status: Former Smoker    Packs/day: 3.00    Years: 50.00    Pack years: 150.00    Types: Cigarettes    Quit date: 05/10/2015    Years since quitting: 4.6  . Smokeless tobacco: Never Used  . Tobacco comment: Counseling sheet [phone]   Vaping Use  . Vaping Use: Never used  Substance and Sexual Activity  . Alcohol use: No    Alcohol/week: 0.0 standard drinks  . Drug use: No  . Sexual activity: Never    Allergies  Allergen Reactions  . Amlodipine     Cramps   . Anoro Ellipta [Umeclidinium-Vilanterol] Other (See Comments)    Chest pain if used more than 3 consecutive days   . Codeine     REACTION: headaches  . Gabapentin     headache    . Prozac [Fluoxetine Hcl] Other (See Comments)    Suicidal ideations     Outpatient Medications Prior to Visit  Medication Sig Dispense Refill  . albuterol (PROAIR HFA) 108 (90 Base) MCG/ACT inhaler INHALE 2 PUFFS EVERY 6  HOURS AS NEEDED FOR  SHORTNESS OF BREATH OR  WHEEZING 34 g 3  . alum & mag hydroxide-simeth (MAALOX/MYLANTA) 200-200-20 MG/5ML suspension Take by mouth every 6 (six) hours as needed for indigestion or heartburn.    Marland Kitchen aspirin EC 81 MG tablet Take 81 mg by mouth daily.    . Aspirin-Acetaminophen-Caffeine 604-540-98 MG PACK Take 1 Package by mouth as needed.     Marland Kitchen atorvastatin (LIPITOR) 80 MG tablet TAKE 1 TABLET BY MOUTH ONCE DAILY AT BEDTIME FOR  CHOLESTEROL 90 tablet 3  . Biotin 5000 MCG TABS Take by mouth daily.    . bisoprolol (ZEBETA) 5 MG tablet One twice daily 60 tablet 11  . doxycycline (VIBRA-TABS) 100 MG tablet Take 1 tablet (100 mg total) by mouth 2 (two) times daily. 14 tablet 0  . estradiol (ESTRACE VAGINAL) 0.1 MG/GM vaginal cream Place 1 Applicatorful vaginally at bedtime. 42.5 g 12  . ferrous sulfate 325 (65 FE) MG EC tablet Take 1 tablet (325 mg total) by mouth daily. With Meal. 90 tablet 3  . fluticasone-salmeterol (ADVAIR HFA) 230-21 MCG/ACT inhaler Inhale 2 puffs into the lungs 2 (two) times daily. 3 Inhaler 4  . glipiZIDE (GLUCOTROL XL) 5 MG 24 hr tablet TAKE 1 TABLET BY MOUTH  DAILY WITH BREAKFAST 90 tablet 1  . glucose blood (ONETOUCH VERIO) test strip Use to check blood sugar twice daily. Dx: E11.9 300 each 1  . hydrALAZINE (APRESOLINE) 50 MG tablet TAKE 1 TABLET BY MOUTH 3  TIMES DAILY 270 tablet 3  . ibuprofen (ADVIL,MOTRIN) 200 MG tablet Take 600 mg by mouth as needed for moderate pain.    Marland Kitchen ipratropium-albuterol (DUONEB) 0.5-2.5 (3) MG/3ML SOLN Take 3 mLs by nebulization every 6 (six) hours as needed. 360 mL 6  . Lancets (ONETOUCH DELICA PLUS [id number]) MISC USE TO CHECK BLOOD SUGAR  TWICE DAILY 200 each 3  . levothyroxine (SYNTHROID) 137 MCG  tablet TAKE 1 TABLET BY MOUTH  DAILY BEFORE BREAKFAST 90 tablet 2  . LORazepam (ATIVAN) 0.5 MG tablet TAKE 1 TABLET BY MOUTH 3 TIMES A DAY AS NEEDED FOR ANXIETY OR NERVOUSNESS 90 tablet 2  . losartan (COZAAR) 100 MG tablet TAKE 1 TABLET BY MOUTH  DAILY 90 tablet 3  . Melatonin 10 MG TABS Take 1 tablet by mouth at bedtime.    . methocarbamol (ROBAXIN) 500 MG  tablet Take 1 tablet (500 mg total) by mouth every 6 (six) hours as needed for muscle spasms. 20 tablet 0  . Multiple Vitamins-Minerals (HAIR/SKIN/NAILS) TABS Take by mouth daily.    Marland Kitchen omeprazole (PRILOSEC) 20 MG capsule Take 30- 60 min before your first and last meals of the day 180 capsule 3  . Respiratory Therapy Supplies (NEBULIZER/TUBING/MOUTHPIECE) KIT 1 application by Does not apply route every 6 (six) hours as needed. FAX TO [phone] 1 kit 11  . SPIRIVA RESPIMAT 2.5 MCG/ACT AERS USE 2 INHALATIONS BY MOUTH  ONCE DAILY 12 g 1  . valACYclovir (VALTREX) 500 MG tablet TAKE 1 TABLET BY MOUTH  DAILY 90 tablet 3  . Valerian Root 500 MG CAPS Take 2 capsules by mouth at bedtime as needed.      Facility-Administered Medications Prior to Visit  Medication Dose Route Frequency Provider Last Rate Last Admin  . albuterol (VENTOLIN HFA) 108 (90 Base) MCG/ACT inhaler 2 puff  2 puff Inhalation Once [name], NP        Review of Systems  Constitutional: Negative for chills, diaphoresis, fever, malaise/fatigue and weight loss.  HENT: Negative for congestion.   Respiratory: Positive for shortness of breath. Negative for cough, hemoptysis, sputum production and wheezing.   Cardiovascular: Negative for chest pain, palpitations and leg swelling.     Objective:   Vitals:   01/04/20 1454  BP: 132/82  Pulse: 80  Temp: (!) 97.1 F (36.2 C)  TempSrc: Temporal  SpO2: 96%  Weight: 190 lb 3.2 oz (86.3 kg)  Height: 5' 3"  (1.6 m)   SpO2: 96 % O2 Device: None (Room air)  Physical Exam: General: Well-appearing, no acute distress HENT:  [place], AT Eyes: EOMI, no scleral icterus Respiratory: Clear to auscultation bilaterally.  No crackles, wheezing or rales Cardiovascular: RRR, -M/R/G, no JVD GI: BS+, soft, nontender Extremities:-Edema,-tenderness Neuro: AAO x4, CNII-XII grossly intact Skin: Intact, no rashes or bruising Psych: Normal mood, normal affect  Data Reviewed:  Imaging: CXR 09/26/15 - Bibasilar atelectasis, R IJ in place  PFT: 04/26/2018 FVC 1.88 (60%) FEV1 0.87 (35%) Ratio 45  TLC 114% DLCO 73% Interpretation: Very severe obstructive defect with air trapping and reduced DLCO consistent with emphysema. Significant bronchodilator response  Labs: CBC    Component Value Date/Time   WBC 8.8 09/28/2019 1211   RBC 4.39 09/28/2019 1211   HGB 11.8 09/28/2019 1211   HGB 12.1 10/08/2015 1407   HCT 37.7 09/28/2019 1211   HCT 38.1 10/08/2015 1407   PLT 277 09/28/2019 1211   PLT 633 (H) 10/08/2015 1407   MCV 85.9 09/28/2019 1211   MCV 90 10/08/2015 1407   MCH 26.9 (L) 09/28/2019 1211   MCHC 31.3 (L) 09/28/2019 1211   RDW 15.3 (H) 09/28/2019 1211   RDW 14.8 10/08/2015 1407   LYMPHSABS 1,778 09/28/2019 1211   LYMPHSABS 1.7 10/08/2015 1407   MONOABS 0.7 02/07/2019 1548   EOSABS 299 09/28/2019 1211   EOSABS 0.2 10/08/2015 1407   BASOSABS 26 09/28/2019 1211   BASOSABS 0.0 10/08/2015 1407   Imaging, labs and test noted above have been reviewed independently by me.    Assessment & Plan:   Discussion: 64 year old former smoker who presents for COPD and OSA for follow-up. She has been more active since our last visit and has a greater exercise tolerance which has improved her overall symptoms. She is no longer on the OSA study and uncontrolled symptoms related to OSA.  COPD GOLD D Category --CONTINUE  Advair TWO puffs TWICE a day. Please call our office with the strength of the inhaler so we can update our system --CONTINUE Spiriva 2.24mg TWO puffs ONCE a day --Refer to CT Lung Screening (quit smoking in  2017) --Arrange for pulmonary function tests  Moderate-Severe OSA - symptomatic --Patient previously participating in online study. Out of study for one year. --Will arrange for auto CPAP supplies  Health Maintenance Immunization History  Administered Date(s) Administered  . Influenza Split 04/25/2011  . Influenza Whole 02/06/2009  . Influenza, Seasonal, Injecte, Preservative Fre 05/20/2012  . Influenza,inj,Quad PF,6+ Mos 03/15/2013, 01/12/2015, 01/25/2016, 12/23/2016, 01/22/2018, 01/26/2019  . Influenza-Unspecified 12/13/2013  . PFIZER SARS-COV-2 Vaccination 07/07/2019, 07/28/2019  . Pneumococcal Polysaccharide-23 05/20/2012  . Tdap 06/12/2011  . Zoster 12/16/2015   CT Lung Screen - will refer to lung nodule clinic  Orders Placed This Encounter  Procedures  . Ambulatory Referral for Lung Cancer Scre    Referral Priority:   Routine    Referral Type:   Consultation    Referral Reason:   Specialty Services Required    Number of Visits Requested:   1  . Ambulatory Referral for DME    Referral Priority:   Routine    Referral Type:   Durable Medical Equipment Purchase    Number of Visits Requested:   1  . Pulmonary function test    Standing Status:   Future    Standing Expiration Date:   01/03/2021    Order Specific Question:   Where should this test be performed?    Answer:   [facility]    Order Specific Question:   Full PFT: includes the following: basic spirometry, spirometry pre & post bronchodilator, diffusion capacity (DLCO), lung volumes    Answer:   Full PFT   Meds ordered this encounter  Medications  . fluticasone-salmeterol (ADVAIR HFA) 230-21 MCG/ACT inhaler    Sig: Inhale 2 puffs into the lungs 2 (two) times daily.    Dispense:  12 g    Refill:  6  . Tiotropium Bromide Monohydrate (SPIRIVA RESPIMAT) 2.5 MCG/ACT AERS    Sig: USE 2 INHALATIONS BY MOUTH  ONCE DAILY    Dispense:  12 g    Refill:  3    Requesting 1 year supply    Return in about 3 months  (around 04/04/2020).  CSan Saba MD [facility] 01/04/2020 3:10 PM  Office Number [phone]

## 2020-01-04 NOTE — Patient Instructions (Addendum)
COPD GOLD D Category --CONTINUE Advair TWO puffs TWICE a day. Please call our office with the strength of the inhaler so we can update our system --CONTINUE Spiriva 2.41mcg TWO puffs ONCE a day --Refer to CT Lung Screening (quit smoking in 2017) --Arrange for pulmonary function tests  Moderate-Severe OSA - symptomatic --Patient previously participating in online study. Out of study for one year. --Will arrange for auto CPAP supplies  Follow-up with me in 3 months

## 2020-01-05 NOTE — Telephone Encounter (Signed)
Dr. Loanne Drilling, please see mychart message sent by pt:  To: [facility] POOL    From: DORINNE GRAEFF    Created: 01/04/2020 10:30 PM     *-*-*This message was handled on 01/05/2020 1:56 PM by Neta Upadhyay P*-*-*  The Advair HFA that I use is 221mcg/21mcg. Dr. Loanne Drilling wanted me to call this information in after my visit.

## 2020-01-09 NOTE — Telephone Encounter (Signed)
Updated note and EMR records.

## 2020-01-13 ENCOUNTER — Ambulatory Visit
Admission: RE | Admit: 2020-01-13 | Discharge: 2020-01-13 | Disposition: A | Discharge status: Home / Self Care | Payer: Medicare Other | Source: Ambulatory Visit | Attending: General Practice | Admitting: General Practice

## 2020-01-13 ENCOUNTER — Other Ambulatory Visit: Payer: Self-pay | Admitting: General Practice

## 2020-01-13 ENCOUNTER — Other Ambulatory Visit: Payer: Self-pay

## 2020-01-13 DIAGNOSIS — R928 Other abnormal and inconclusive findings on diagnostic imaging of breast: Secondary | ICD-10-CM

## 2020-01-13 DIAGNOSIS — Z1231 Encounter for screening mammogram for malignant neoplasm of breast: Secondary | ICD-10-CM

## 2020-01-24 ENCOUNTER — Other Ambulatory Visit: Payer: Self-pay | Admitting: Gastroenterology

## 2020-01-28 ENCOUNTER — Other Ambulatory Visit: Payer: Self-pay | Admitting: Internal Medicine

## 2020-01-30 ENCOUNTER — Other Ambulatory Visit: Payer: Self-pay | Admitting: *Deleted

## 2020-01-30 DIAGNOSIS — Z87891 Personal history of nicotine dependence: Secondary | ICD-10-CM

## 2020-02-03 ENCOUNTER — Other Ambulatory Visit: Payer: Medicare Other

## 2020-02-03 ENCOUNTER — Encounter: Payer: Self-pay | Admitting: Pulmonary Disease

## 2020-02-03 ENCOUNTER — Telehealth: Payer: Self-pay | Admitting: Pulmonary Disease

## 2020-02-03 NOTE — Progress Notes (Signed)
    Our office received request to evaluate surgical/procedural risk for patient's pulmonary conditions. She was last seen in the office on 01/04/20. Briefly, her COPD and OSA is well-controlled when on therapy.    Peri-operative Assessment of Pulmonary Risk for Non-Thoracic Surgery:  For Grace [name], risk of perioperative pulmonary complications is increased by:  COPD   Obstructive sleep apnea   Respiratory complications generally occur in 1% of ASA Class I patients, 5% of ASA Class II and 10% of ASA Class III-IV patients These complications rarely result in mortality and include postoperative pneumonia, atelectasis, pulmonary embolism, ARDS and increased time requiring postoperative mechanical ventilation.  Overall, I recommend proceeding with the surgery if the risk for respiratory complications are outweighed by the potential benefits. This will need to be discussed between the patient and surgeon.  To reduce risks of respiratory complications, I recommend: --Pre- and post-operative incentive spirometry performed frequently while awake --Inpatient use of currently prescribed positive-pressure for OSA whenever the patient is sleeping --Avoiding use of pancuronium during anesthesia.  Rodman Pickle, M.D. [facility] Pulmonary/Critical Care Medicine 02/03/2020 2:33 PM

## 2020-02-03 NOTE — Telephone Encounter (Signed)
The original note from [place] , the addendum , the most up to date office note were all faxed to [facility]   Called patient let her know these were faxed. She voiced understanding Nothing further needed at this time.

## 2020-02-07 ENCOUNTER — Other Ambulatory Visit: Payer: Self-pay

## 2020-02-11 ENCOUNTER — Other Ambulatory Visit (HOSPITAL_COMMUNITY)
Admission: RE | Admit: 2020-02-11 | Discharge: 2020-02-11 | Disposition: A | Discharge status: Home / Self Care | Payer: Medicare Other | Source: Ambulatory Visit | Attending: Gastroenterology | Admitting: Gastroenterology

## 2020-02-11 DIAGNOSIS — Z20822 Contact with and (suspected) exposure to covid-19: Secondary | ICD-10-CM | POA: Insufficient documentation

## 2020-02-11 DIAGNOSIS — Z01812 Encounter for preprocedural laboratory examination: Secondary | ICD-10-CM | POA: Diagnosis present

## 2020-02-12 LAB — SARS CORONAVIRUS 2 (TAT 6-24 HRS): SARS Coronavirus 2: NEGATIVE

## 2020-02-13 ENCOUNTER — Other Ambulatory Visit: Payer: Self-pay | Admitting: Internal Medicine

## 2020-02-14 ENCOUNTER — Other Ambulatory Visit: Payer: Self-pay | Admitting: Gastroenterology

## 2020-02-14 NOTE — Telephone Encounter (Signed)
Pt is requesting refill on BISOPROLOL FUMARATE  5MG   TAB next ov 02/20/20

## 2020-02-15 ENCOUNTER — Ambulatory Visit (HOSPITAL_COMMUNITY): Payer: Medicare Other | Admitting: Anesthesiology

## 2020-02-15 ENCOUNTER — Other Ambulatory Visit: Payer: Self-pay

## 2020-02-15 ENCOUNTER — Ambulatory Visit (HOSPITAL_COMMUNITY)
Admission: RE | Admit: 2020-02-15 | Discharge: 2020-02-15 | Disposition: A | Discharge status: Home / Self Care | Payer: Medicare Other | Attending: Gastroenterology | Admitting: Gastroenterology

## 2020-02-15 ENCOUNTER — Encounter (HOSPITAL_COMMUNITY)
Admission: RE | Disposition: A | Discharge status: Home / Self Care | Payer: Self-pay | Source: Home / Self Care | Attending: Gastroenterology

## 2020-02-15 ENCOUNTER — Encounter (HOSPITAL_COMMUNITY): Payer: Self-pay | Admitting: Gastroenterology

## 2020-02-15 ENCOUNTER — Other Ambulatory Visit: Payer: Self-pay | Admitting: Internal Medicine

## 2020-02-15 ENCOUNTER — Other Ambulatory Visit: Payer: Self-pay | Admitting: Nurse Practitioner

## 2020-02-15 DIAGNOSIS — Z79899 Other long term (current) drug therapy: Secondary | ICD-10-CM | POA: Diagnosis not present

## 2020-02-15 DIAGNOSIS — Z7982 Long term (current) use of aspirin: Secondary | ICD-10-CM | POA: Insufficient documentation

## 2020-02-15 DIAGNOSIS — D122 Benign neoplasm of ascending colon: Secondary | ICD-10-CM | POA: Diagnosis not present

## 2020-02-15 DIAGNOSIS — Z1211 Encounter for screening for malignant neoplasm of colon: Secondary | ICD-10-CM | POA: Diagnosis not present

## 2020-02-15 DIAGNOSIS — K5730 Diverticulosis of large intestine without perforation or abscess without bleeding: Secondary | ICD-10-CM | POA: Diagnosis not present

## 2020-02-15 DIAGNOSIS — K22710 Barrett's esophagus with low grade dysplasia: Secondary | ICD-10-CM | POA: Insufficient documentation

## 2020-02-15 DIAGNOSIS — Z79890 Hormone replacement therapy (postmenopausal): Secondary | ICD-10-CM | POA: Insufficient documentation

## 2020-02-15 DIAGNOSIS — E1140 Type 2 diabetes mellitus with diabetic neuropathy, unspecified: Secondary | ICD-10-CM

## 2020-02-15 DIAGNOSIS — K315 Obstruction of duodenum: Secondary | ICD-10-CM | POA: Diagnosis not present

## 2020-02-15 DIAGNOSIS — J449 Chronic obstructive pulmonary disease, unspecified: Secondary | ICD-10-CM | POA: Diagnosis not present

## 2020-02-15 DIAGNOSIS — Z7984 Long term (current) use of oral hypoglycemic drugs: Secondary | ICD-10-CM | POA: Insufficient documentation

## 2020-02-15 DIAGNOSIS — K649 Unspecified hemorrhoids: Secondary | ICD-10-CM | POA: Insufficient documentation

## 2020-02-15 DIAGNOSIS — K269 Duodenal ulcer, unspecified as acute or chronic, without hemorrhage or perforation: Secondary | ICD-10-CM | POA: Insufficient documentation

## 2020-02-15 DIAGNOSIS — D123 Benign neoplasm of transverse colon: Secondary | ICD-10-CM | POA: Insufficient documentation

## 2020-02-15 DIAGNOSIS — K219 Gastro-esophageal reflux disease without esophagitis: Secondary | ICD-10-CM | POA: Diagnosis not present

## 2020-02-15 DIAGNOSIS — R1310 Dysphagia, unspecified: Secondary | ICD-10-CM | POA: Diagnosis present

## 2020-02-15 DIAGNOSIS — Z87891 Personal history of nicotine dependence: Secondary | ICD-10-CM | POA: Insufficient documentation

## 2020-02-15 HISTORY — PX: POLYPECTOMY: SHX5525

## 2020-02-15 HISTORY — PX: COLONOSCOPY WITH PROPOFOL: SHX5780

## 2020-02-15 HISTORY — PX: ESOPHAGOGASTRODUODENOSCOPY (EGD) WITH PROPOFOL: SHX5813

## 2020-02-15 HISTORY — PX: BIOPSY: SHX5522

## 2020-02-15 LAB — GLUCOSE, CAPILLARY: Glucose-Capillary: 130 mg/dL — ABNORMAL HIGH (ref 70–99)

## 2020-02-15 SURGERY — ESOPHAGOGASTRODUODENOSCOPY (EGD) WITH PROPOFOL
Anesthesia: Monitor Anesthesia Care

## 2020-02-15 MED ORDER — SODIUM CHLORIDE 0.9 % IV SOLN: INTRAVENOUS | Status: DC

## 2020-02-15 MED ORDER — LACTATED RINGERS IV SOLN
INTRAVENOUS | Status: AC | PRN
  Administered 2020-02-15: 1000 mL via INTRAVENOUS

## 2020-02-15 MED ORDER — PROPOFOL 500 MG/50ML IV EMUL
INTRAVENOUS | Status: DC | PRN
  Administered 2020-02-15: 200 ug/kg/min via INTRAVENOUS

## 2020-02-15 SURGICAL SUPPLY — 25 items

## 2020-02-15 NOTE — Op Note (Signed)
[facility] Patient Name: Grace [name] Procedure Date: 02/15/2020 MRN: [id number] Attending MD: Arta Silence , MD Date of Birth: May 14, 1955 CSN: [id number] Age: 64 Admit Type: Outpatient Procedure:                Colonoscopy Indications:              Screening for colorectal malignant neoplasm, Last                            colonoscopy: 2011 Providers:                Arta Silence, MD, Cleda Daub, RN, Laverda Sorenson, Technician, Barrett Henle, CRNA Referring MD:              Medicines:                Monitored Anesthesia Care Complications:            No immediate complications. Estimated Blood Loss:     Estimated blood loss: none. Procedure:                Pre-Anesthesia Assessment:                           - Prior to the procedure, a History and Physical                            was performed, and patient medications and                            allergies were reviewed. The patient's tolerance of                            previous anesthesia was also reviewed. The risks                            and benefits of the procedure and the sedation                            options and risks were discussed with the patient.                            All questions were answered, and informed consent                            was obtained. Prior Anticoagulants: The patient has                            taken no previous anticoagulant or antiplatelet                            agents except for NSAID medication. ASA Grade                            Assessment: III -  A patient with severe systemic                            disease. After reviewing the risks and benefits,                            the patient was deemed in satisfactory condition to                            undergo the procedure.                           After obtaining informed consent, the colonoscope                            was passed under direct  vision. Throughout the                            procedure, the patient's blood pressure, pulse, and                            oxygen saturations were monitored continuously. The                            [id number]-HQ190L ([id number]) Olympus colonoscope was                            introduced through the anus and advanced to the the                            cecum, identified by appendiceal orifice and                            ileocecal valve. The ileocecal valve, appendiceal                            orifice, and rectum were photographed. The entire                            colon was examined. The colonoscopy was performed                            without difficulty. The patient tolerated the                            procedure well. The quality of the bowel                            preparation was good. Scope In: 9:47:42 AM Scope Out: 10:04:31 AM Scope Withdrawal Time: 0 hours 14 minutes 20 seconds  Total Procedure Duration: 0 hours 16 minutes 49 seconds  Findings:      Hemorrhoids were found on perianal exam.      A few medium-mouthed diverticula were found in the sigmoid colon.      A 8 mm  polyp was found in the ileocecal valve. The polyp was sessile.       The polyp was removed with a hot snare. Resection and retrieval were       complete.      A 5 mm polyp was found in the ascending colon. The polyp was sessile.       The polyp was removed with a cold snare. Resection and retrieval were       complete.      A 6 mm polyp was found in the transverse colon. The polyp was sessile.       The polyp was removed with a hot snare. Resection and retrieval were       complete.      Colon otherwise normal; no other polyps, masses, vascular ectasias, or       inflammatory changes were seen.      The retroflexed view of the distal rectum and anal verge was normal and       showed no anal or rectal abnormalities. Impression:               - Hemorrhoids found on perianal exam.                            - Diverticulosis in the sigmoid colon.                           - One 8 mm polyp at the ileocecal valve, removed                            with a hot snare. Resected and retrieved.                           - One 5 mm polyp in the ascending colon, removed                            with a cold snare. Resected and retrieved.                           - One 6 mm polyp in the transverse colon, removed                            with a hot snare. Resected and retrieved.                           - The distal rectum and anal verge are normal on                            retroflexion view.                           - The examination was otherwise normal. Moderate Sedation:      None Recommendation:           - Patient has a contact number available for                            emergencies. The signs and symptoms of  potential                            delayed complications were discussed with the                            patient. Return to normal activities tomorrow.                            Written discharge instructions were provided to the                            patient.                           - Discharge patient to home (ambulatory).                           - High fiber diet indefinitely.                           - No aspirin, ibuprofen, naproxen, or other                            non-steroidal anti-inflammatory drugs indefinitely.                           - Await pathology results.                           - Needs BID PPI until further notice.                           - Repeat colonoscopy (date not yet determined) for                            surveillance based on pathology results.                           - Return to GI clinic after studies are complete.                           - Return to referring physician as previously                            scheduled. Procedure Code(s):        --- Professional ---                           [phone],  Colonoscopy, flexible; with removal of                            tumor(s), polyp(s), or other lesion(s) by snare                            technique Diagnosis Code(s):        --- Professional ---  Z12.11, Encounter for screening for malignant                            neoplasm of colon                           K64.9, Unspecified hemorrhoids                           K63.5, Polyp of colon                           K57.30, Diverticulosis of large intestine without                            perforation or abscess without bleeding CPT copyright 2019 American Medical Association. All rights reserved. The codes documented in this report are preliminary and upon coder review may  be revised to meet current compliance requirements. Arta Silence, MD 02/15/2020 10:23:24 AM This report has been signed electronically. Number of Addenda: 0

## 2020-02-15 NOTE — H&P (Signed)
Patient interval history reviewed.  Patient examined again.  There has been no change from documented H/P dated 01/24/20 scanned into chart from our office except as documented below...  Assessment:  1.  Barrett's esophagus with low/high grade dysplasia. 2.  Dysphagia, atypical, mild. 3.  Colon cancer screening, last colonoscopy 2011.  Plan:  1.  Endoscopy with possible esophageal dilatation. 2.  Risks (bleeding, infection, bowel perforation that could require surgery, sedation-related changes in cardiopulmonary systems), benefits (identification and possible treatment of source of symptoms, exclusion of certain causes of symptoms), and alternatives (watchful waiting, radiographic imaging studies, empiric medical treatment) of upper endoscopy with possible esophageal dilatation (EGD +/- DIL) were explained to patient/family in detail and patient wishes to proceed. 3.  Risks (bleeding, infection, bowel perforation that could require surgery, sedation-related changes in cardiopulmonary systems), benefits (identification and possible treatment of source of symptoms, exclusion of certain causes of symptoms), and alternatives (watchful waiting, radiographic imaging studies, empiric medical treatment) of colonoscopy were explained to patient/family in detail and patient wishes to proceed.

## 2020-02-15 NOTE — Anesthesia Preprocedure Evaluation (Signed)
Anesthesia Evaluation  Patient identified by MRN, date of birth, ID band Patient awake    Reviewed: Allergy & Precautions, NPO status , Patient's Chart, lab work & pertinent test results  Airway Mallampati: II  TM Distance: >3 FB Neck ROM: Full    Dental no notable dental hx.    Pulmonary asthma , COPD,  COPD inhaler, former smoker,    breath sounds clear to auscultation + wheezing      Cardiovascular hypertension, Normal cardiovascular exam Rhythm:Regular Rate:Normal     Neuro/Psych CVA, No Residual Symptoms negative psych ROS   GI/Hepatic Neg liver ROS, PUD, GERD  ,  Endo/Other  diabetesHypothyroidism   Renal/GU negative Renal ROS  negative genitourinary   Musculoskeletal negative musculoskeletal ROS (+)   Abdominal   Peds negative pediatric ROS (+)  Hematology negative hematology ROS (+)   Anesthesia Other Findings   Reproductive/Obstetrics negative OB ROS                             Anesthesia Physical Anesthesia Plan  ASA: III  Anesthesia Plan: MAC   Post-op Pain Management:    Induction: Intravenous  PONV Risk Score and Plan: 0  Airway Management Planned: Nasal Cannula  Additional Equipment:   Intra-op Plan:   Post-operative Plan:   Informed Consent: I have reviewed the patients History and Physical, chart, labs and discussed the procedure including the risks, benefits and alternatives for the proposed anesthesia with the patient or authorized representative who has indicated his/her understanding and acceptance.     Dental advisory given  Plan Discussed with: CRNA and Surgeon  Anesthesia Plan Comments:         Anesthesia Quick Evaluation

## 2020-02-15 NOTE — Op Note (Signed)
[facility] Patient Name: Grace [name] Procedure Date: 02/15/2020 MRN: [id number] Attending MD: Arta Silence , MD Date of Birth: 12-05-1955 CSN: [id number] Age: 64 Admit Type: Outpatient Procedure:                Upper GI endoscopy Indications:              Dysphagia, Esophageal reflux, Esophageal reflux                            symptoms that persist despite appropriate therapy,                            Barrett's low grade dysplasia, Surveillance for                            malignancy due to personal history of Barrett's                            esophagus Providers:                Arta Silence, MD, Cleda Daub, RN, Laverda Sorenson, Technician, Barrett Henle, CRNA Referring MD:              Medicines:                Monitored Anesthesia Care Complications:            No immediate complications. Estimated Blood Loss:     Estimated blood loss: none. Procedure:                Pre-Anesthesia Assessment:                           - Prior to the procedure, a History and Physical                            was performed, and patient medications and                            allergies were reviewed. The patient's tolerance of                            previous anesthesia was also reviewed. The risks                            and benefits of the procedure and the sedation                            options and risks were discussed with the patient.                            All questions were answered, and informed consent                            was obtained. Prior Anticoagulants:  The patient has                            taken no previous anticoagulant or antiplatelet                            agents except for NSAID medication. ASA Grade                            Assessment: III - A patient with severe systemic                            disease. After reviewing the risks and benefits,                            the  patient was deemed in satisfactory condition to                            undergo the procedure.                           After obtaining informed consent, the endoscope was                            passed under direct vision. Throughout the                            procedure, the patient's blood pressure, pulse, and                            oxygen saturations were monitored continuously. The                            [id number]-H190 ([id number] Olympus gastroscope was                            introduced through the mouth, and advanced to the                            duodenal bulb. The upper GI endoscopy was                            accomplished without difficulty. The patient                            tolerated the procedure well. Scope In: Scope Out: Findings:      There were esophageal mucosal changes suspicious for short-segment       Barrett's esophagus present in the lower third of the esophagus. The       maximum longitudinal extent of these mucosal changes was 3 cm in length.       Mucosa was biopsied with a cold forceps for histology in the lower third       of the esophagus. There was patchy ulceration and nodularity noted in       the distal margin  of the Barrett's.      A medium amount of food (residue) was found in the gastric body and in       the gastric antrum. Mucosal views in this area were compromised.      The exam of the stomach was otherwise normal.      Food (residue) was found in the duodenal bulb.      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 Ulcer noted       at apex of duodenal bulb with tight stricture, about 2-3 mm in diameter. Impression:               - Esophageal mucosal changes suspicious for                            short-segment Barrett's esophagus, C0M3. Nodularity                            and ulceration noted in distal-most margin.                            Biopsied.                           - A medium amount of food  (residue) in the stomach.                           - Retained food in the duodenum.                           - Acquired duodenal stenosis. Moderate Sedation:      None Recommendation:           - Await pathology results.                           - Perform a colonoscopy today. Procedure Code(s):        --- Professional ---                           [id number], Esophagogastroduodenoscopy, flexible,                            transoral; with biopsy, single or multiple Diagnosis Code(s):        --- Professional ---                           K31.5, Obstruction of duodenum                           R13.10, Dysphagia, unspecified                           K21.9, Gastro-esophageal reflux disease without                            esophagitis                           K22.710, Barrett's esophagus  with low grade                            dysplasia CPT copyright 2019 [employer]. All rights reserved. The codes documented in this report are preliminary and upon coder review may  be revised to meet current compliance requirements. Arta Silence, MD 02/15/2020 10:19:52 AM This report has been signed electronically. Number of Addenda: 0

## 2020-02-15 NOTE — Discharge Instructions (Signed)

## 2020-02-15 NOTE — Transfer of Care (Signed)
Immediate Anesthesia Transfer of Care Note  Patient: Grace [name]  Procedure(s) Performed: ESOPHAGOGASTRODUODENOSCOPY (EGD) WITH PROPOFOL (N/A ) COLONOSCOPY WITH PROPOFOL (N/A ) BIOPSY POLYPECTOMY  Patient Location: Endoscopy Unit  Anesthesia Type:MAC  Level of Consciousness: awake, alert , oriented and patient cooperative  Airway & Oxygen Therapy: Patient Spontanous Breathing and Patient connected to nasal cannula oxygen  Post-op Assessment: Report given to RN, Post -op Vital signs reviewed and stable and Patient moving all extremities  Post vital signs: Reviewed and stable  Last Vitals:  Vitals Value Taken Time  BP 133/71 02/15/20 1011  Temp    Pulse 76 02/15/20 1014  Resp 19 02/15/20 1014  SpO2 98 % 02/15/20 1014  Vitals shown include unvalidated device data.  Last Pain:  Vitals:   02/15/20 0818  TempSrc: Oral  PainSc: 0-No pain         Complications: No complications documented.

## 2020-02-15 NOTE — Anesthesia Postprocedure Evaluation (Signed)
Anesthesia Post Note  Patient: Grace [name]  Procedure(s) Performed: ESOPHAGOGASTRODUODENOSCOPY (EGD) WITH PROPOFOL (N/A ) COLONOSCOPY WITH PROPOFOL (N/A ) BIOPSY POLYPECTOMY     Patient location during evaluation: PACU Anesthesia Type: MAC Level of consciousness: awake and alert Pain management: pain level controlled Vital Signs Assessment: post-procedure vital signs reviewed and stable Respiratory status: spontaneous breathing, nonlabored ventilation, respiratory function stable and patient connected to nasal cannula oxygen Cardiovascular status: stable and blood pressure returned to baseline Postop Assessment: no apparent nausea or vomiting Anesthetic complications: no   No complications documented.  Last Vitals:  Vitals:   02/15/20 1020 02/15/20 1030  BP: 125/69 (!) 150/83  Pulse: 83 74  Resp: (!) 23 17  Temp:    SpO2: 97% 98%    Last Pain:  Vitals:   02/15/20 1030  TempSrc:   PainSc: 0-No pain                 Arham Symmonds S

## 2020-02-16 ENCOUNTER — Encounter (HOSPITAL_COMMUNITY): Payer: Self-pay | Admitting: Gastroenterology

## 2020-02-16 LAB — SURGICAL PATHOLOGY

## 2020-02-19 ENCOUNTER — Other Ambulatory Visit: Payer: Self-pay | Admitting: Nurse Practitioner

## 2020-02-19 DIAGNOSIS — E119 Type 2 diabetes mellitus without complications: Secondary | ICD-10-CM

## 2020-02-20 ENCOUNTER — Ambulatory Visit
Admission: RE | Admit: 2020-02-20 | Discharge: 2020-02-20 | Disposition: A | Discharge status: Home / Self Care | Payer: Medicare Other | Source: Ambulatory Visit | Attending: General Practice | Admitting: General Practice

## 2020-02-20 ENCOUNTER — Telehealth: Payer: Self-pay | Admitting: Acute Care

## 2020-02-20 ENCOUNTER — Ambulatory Visit (INDEPENDENT_AMBULATORY_CARE_PROVIDER_SITE_OTHER): Payer: Medicare Other | Admitting: Acute Care

## 2020-02-20 ENCOUNTER — Encounter: Payer: Self-pay | Admitting: Acute Care

## 2020-02-20 ENCOUNTER — Ambulatory Visit (INDEPENDENT_AMBULATORY_CARE_PROVIDER_SITE_OTHER)
Admission: RE | Admit: 2020-02-20 | Discharge: 2020-02-20 | Disposition: A | Discharge status: Home / Self Care | Payer: Medicare Other | Source: Ambulatory Visit | Attending: Acute Care | Admitting: Acute Care

## 2020-02-20 ENCOUNTER — Other Ambulatory Visit: Payer: Self-pay

## 2020-02-20 VITALS — BP 140/80 | HR 104 | Temp 97.4°F | Ht 63.0 in | Wt 187.4 lb

## 2020-02-20 DIAGNOSIS — Z87891 Personal history of nicotine dependence: Secondary | ICD-10-CM

## 2020-02-20 DIAGNOSIS — R918 Other nonspecific abnormal finding of lung field: Secondary | ICD-10-CM

## 2020-02-20 DIAGNOSIS — R928 Other abnormal and inconclusive findings on diagnostic imaging of breast: Secondary | ICD-10-CM

## 2020-02-20 DIAGNOSIS — Z122 Encounter for screening for malignant neoplasm of respiratory organs: Secondary | ICD-10-CM

## 2020-02-20 NOTE — Progress Notes (Addendum)
Shared Decision Making Visit Lung Cancer Screening Program [phone])   Eligibility:  Age 64 y.o.  Pack Years Smoking History Calculation  119 pack year smoking history (# packs/per year x # years smoked)  Recent History of coughing up blood  no  Unexplained weight loss? no ( >Than 15 pounds within the last 6 months )  Prior History Lung / other cancer no (Diagnosis within the last 5 years already requiring surveillance chest CT Scans).  Smoking Status Former Smoker  Former Smokers: Years since quit: 4 years  Quit Date: 09/23/2015  Visit Components:  Discussion included one or more decision making aids. yes  Discussion included risk/benefits of screening. yes  Discussion included potential follow up diagnostic testing for abnormal scans. yes  Discussion included meaning and risk of over diagnosis. yes  Discussion included meaning and risk of False Positives. yes  Discussion included meaning of total radiation exposure. yes  Counseling Included:  Importance of adherence to annual lung cancer LDCT screening. yes  Impact of comorbidities on ability to participate in the program. yes  Ability and willingness to under diagnostic treatment. yes  Smoking Cessation Counseling:  Current Smokers:   Discussed importance of smoking cessation. yes  Information about tobacco cessation classes and interventions provided to patient. yes  Patient provided with "ticket" for LDCT Scan. yes  Symptomatic Patient. no  CounselingNA  Diagnosis Code: Tobacco Use Z72.0  Asymptomatic Patient yes  Counseling (Intermediate counseling: > three minutes counseling) N9892  Former Smokers:   Discussed the importance of maintaining cigarette abstinence. yes  Diagnosis Code: Personal History of Nicotine Dependence. J19.417  Information about tobacco cessation classes and interventions provided to patient. Yes  Patient provided with "ticket" for LDCT Scan. yes  Written Order for Lung  Cancer Screening with LDCT placed in Epic. Yes (CT Chest Lung Cancer Screening Low Dose W/O CM) [id number] Z12.2-Screening of respiratory organs Z87.891-Personal history of nicotine dependence  BP 140/80 (BP Location: Left Arm, Cuff Size: Normal)   Pulse (!) 104   Temp (!) 97.4 F (36.3 C) (Oral)   Ht 5\' 3"  (1.6 m)   Wt 187 lb 6.4 oz (85 kg)   LMP 02/27/2014   SpO2 96%   BMI 33.20 kg/m    I spent 25 minutes of face to face time with Ms. Borneman discussing the risks and benefits of lung cancer screening. We viewed a power point together that explained in detail the above noted topics. We took the time to pause the power point at intervals to allow for questions to be asked and answered to ensure understanding. We discussed that she had taken the single most powerful action possible to decrease her risk of developing lung cancer when she quit smoking. I counseled her to remain smoke free, and to contact me if she ever had the desire to smoke again so that I can provide resources and tools to help support the effort to remain smoke free. We discussed the time and location of the scan, and that either  Doroteo Glassman RN or I will call with the results within  24-48 hours of receiving them. She has my card and contact information in the event she needs to speak with me, in addition to a copy of the power point we reviewed as a resource. She verbalized understanding of all of the above and had no further questions upon leaving the office.     I explained to the patient that there has been a high incidence of coronary artery  disease noted on these exams. I explained that this is a non-gated exam therefore degree or severity cannot be determined. This patient is currently on statin therapy. I have asked the patient to follow-up with their PCP regarding any incidental finding of coronary artery disease and management with diet or medication as they feel is clinically indicated. The patient verbalized  understanding of the above and had no further questions.     Magdalen Spatz, NP 02/20/2020 9:37 AM

## 2020-02-20 NOTE — Telephone Encounter (Signed)
Noted.  PET has been ordered.  Will close encounter.

## 2020-02-20 NOTE — Patient Instructions (Signed)
Thank you for participating in the [facility]. It was our pleasure to meet you today. We will call you with the results of your scan within the next few days. Your scan will be assigned a Lung RADS category score by the physicians reading the scans.  This Lung RADS score determines follow up scanning.  See below for description of categories, and follow up screening recommendations. We will be in touch to schedule your follow up screening annually or based on recommendations of our providers. We will fax a copy of your scan results to your Primary Care Physician, or the physician who referred you to the program, to ensure they have the results. Please call the office if you have any questions or concerns regarding your scanning experience or results.  Our office number is [phone]. Please speak with Denise Phelps, RN. She is our Lung Cancer Screening RN. If she is unavailable when you call, please have the office staff send her a message. She will return your call at her earliest convenience. Remember, if your scan is normal, we will scan you annually as long as you continue to meet the criteria for the program. (Age 55-77, Current smoker or smoker who has quit within the last 15 years). If you are a smoker, remember, quitting is the single most powerful action that you can take to decrease your risk of lung cancer and other pulmonary, breathing related problems. We know quitting is hard, and we are here to help.  Please let us know if there is anything we can do to help you meet your goal of quitting. If you are a former smoker, congratulations. We are proud of you! Remain smoke free! Remember you can refer friends or family members through the number above.  We will screen them to make sure they meet criteria for the program. Thank you for helping us take better care of you by participating in Lung Screening.  Lung RADS Categories:  Lung RADS 1: no nodules  or definitely non-concerning nodules.  Recommendation is for a repeat annual scan in 12 months.  Lung RADS 2:  nodules that are non-concerning in appearance and behavior with a very low likelihood of becoming an active cancer. Recommendation is for a repeat annual scan in 12 months.  Lung RADS 3: nodules that are probably non-concerning , includes nodules with a low likelihood of becoming an active cancer.  Recommendation is for a 6-month repeat screening scan. Often noted after an upper respiratory illness. We will be in touch to make sure you have no questions, and to schedule your 6-month scan.  Lung RADS 4 A: nodules with concerning findings, recommendation is most often for a follow up scan in 3 months or additional testing based on our provider's assessment of the scan. We will be in touch to make sure you have no questions and to schedule the recommended 3 month follow up scan.  Lung RADS 4 B:  indicates findings that are concerning. We will be in touch with you to schedule additional diagnostic testing based on our provider's  assessment of the scan.   

## 2020-02-20 NOTE — Progress Notes (Signed)
These results were called to the patient. Please see the telephone message dated 02/20/2020.PET scan has been ordered for follow up.

## 2020-02-20 NOTE — Telephone Encounter (Signed)
Received call report from [place] with [facility] in reference to CT low dose.  Report is within epic.   Routing to Judson Roch to make aware.

## 2020-02-20 NOTE — Telephone Encounter (Signed)
I have called the patient with the results of her low dose CT. Her CT was read as a Lung RADS 4 B indicates suspicious findings for which additional diagnostic testing and or tissue sampling is recommended.I explained that we will order a PET scan to better evaluate the area. She already has PFT's scheduled. She verbalized understanding of the above and had no questions at conclusion of the call. She understands that someone will contact her to get this scheduled.

## 2020-02-21 ENCOUNTER — Inpatient Hospital Stay: Admission: RE | Admit: 2020-02-21 | Payer: Medicare Other | Source: Ambulatory Visit

## 2020-02-27 ENCOUNTER — Telehealth: Payer: Self-pay | Admitting: Pulmonary Disease

## 2020-02-27 NOTE — Telephone Encounter (Signed)
Called and spoke with pt in regards to the call. Stated to pt that she would not be sedated to have PET performed and she verbalized understanding. Pt states she no longer needs transportation. Nothing further needed.

## 2020-02-29 ENCOUNTER — Encounter: Payer: Medicare Other | Admitting: Nurse Practitioner

## 2020-03-01 ENCOUNTER — Other Ambulatory Visit: Payer: Self-pay

## 2020-03-01 ENCOUNTER — Ambulatory Visit (INDEPENDENT_AMBULATORY_CARE_PROVIDER_SITE_OTHER): Payer: Medicare Other | Admitting: Pulmonary Disease

## 2020-03-01 ENCOUNTER — Encounter: Payer: Self-pay | Admitting: Pulmonary Disease

## 2020-03-01 VITALS — BP 140/80 | HR 80 | Temp 97.8°F | Wt 182.0 lb

## 2020-03-01 DIAGNOSIS — Z122 Encounter for screening for malignant neoplasm of respiratory organs: Secondary | ICD-10-CM

## 2020-03-01 DIAGNOSIS — J449 Chronic obstructive pulmonary disease, unspecified: Secondary | ICD-10-CM | POA: Diagnosis not present

## 2020-03-01 DIAGNOSIS — Z87891 Personal history of nicotine dependence: Secondary | ICD-10-CM | POA: Diagnosis not present

## 2020-03-01 DIAGNOSIS — R918 Other nonspecific abnormal finding of lung field: Secondary | ICD-10-CM | POA: Diagnosis not present

## 2020-03-01 NOTE — Patient Instructions (Addendum)
Thank you for visiting Dr. Valeta Harms at [facility]. Today we recommend the following:  Keep PET scan appointment.  Once PET is complete we will discuss bronchoscopy if needed  Return in about 6 weeks (around 04/12/2020) for Dr. Valeta Harms .    Please do your part to reduce the spread of COVID-19.

## 2020-03-01 NOTE — H&P (View-Only) (Signed)
 Synopsis: Referred in November 2021 for normal CT chest by Patel, Nilay, DO  Subjective:   PATIENT ID: Zona L Age GENDER: female DOB: 05/20/1955, MRN: [id number]  Chief Complaint  Patient presents with  . establish care    follow up on lung nodule.     64-year-old female, past medical history of Barrett's with high-grade dysplasia, COPD, diabetes, reflux, longstanding tobacco abuse quit in 2014.  Patient was enrolled in lung cancer screening program.  She had an abnormal lung cancer screening CT which led to recommendations for PET scan imaging.  This has been ordered and scheduled for tomorrow.  At baseline she has severe COPD, pulmonary function test last completed in 2020 which revealed a ratio of 46 and an FEV1 of 0.87 L, 35% predicted with a 23% bronchodilator response, evidence of air trapping with an RV of 185% and a DLCO of 73%.  OV 03/01/2020: Here today for review of lung cancer screening CT which completed on 02/20/2020.  This revealed a lung RADS B with a 3.9 cm left lower lobe mass.  There is concern for possible under line primary bronchogenic carcinoma versus infection.  Per patient states that she is not had any significant fevers but has had increased cough and sputum production.  This was approximately 10 days ago she did have her PET scan scheduled already for tomorrow.     Past Medical History:  Diagnosis Date  . Abnormal vaginal bleeding   . Asthma   . Barrett's esophagus    with high grade dysplasia per endoscopy 05/2005 // followed by Dr. Carl Gessner ([facility])  . Chronic back pain   . COPD (chronic obstructive pulmonary disease) (HCC)   . Diabetes mellitus without complication (HCC)   . Duodenal ulcer disease    thought to be contributed at least partly by overuse of NSAIDs and salicylates (goody powdr)  . Endometrial polyp 03/2003   s/p resection in 03/2003. Path showing submucosal leiomyoma and benign  proliferative type endometrium  . Family history of  premature CAD 05/19/2018  . Fibroid uterus    s/p myomectomy x 2  . GERD (gastroesophageal reflux disease)   . H/O failed moderate sedation   . Hiatal hernia   . History of CVA (cerebrovascular accident) 10/2000  . 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 Hyperlipidemia   . Hypertension   . Hypothyroidism    with hx of multinodular goiter (noted on neck US in 11/2002 - showing diffuse nodularity and inhomogenous texture diffusely BL)  . Internal hemorrhoids    grade 2 per colonoscopy in 09/2006 - repeat colonoscopy rec in 5-10 years.  . Migraine   . Nevus   . Personal history of failed conscious sedation 08/11/2011  . Tachycardia   . Thyroid disease   . Tobacco abuse Jan 2014   quit     Family History  Problem Relation Age of Onset  . COPD Mother   . Heart disease Father 58       had 5 heart attacks, first in late 50s  . COPD Father      Past Surgical History:  Procedure Laterality Date  . BIOPSY  02/15/2020   Procedure: BIOPSY;  Surgeon: Outlaw, William, MD;  Location: [facility];  Service: Endoscopy;;  . CHOLECYSTECTOMY    . COLONOSCOPY  02/07/2010  . COLONOSCOPY WITH PROPOFOL N/A 02/15/2020     Procedure: COLONOSCOPY WITH PROPOFOL;  Surgeon: Arta Silence, MD;  Location: [facility];  Service: Endoscopy;  Laterality: N/A;   ESOPHAGOGASTRODUODENOSCOPY (EGD) WITH PROPOFOL N/A 02/15/2020   Procedure: ESOPHAGOGASTRODUODENOSCOPY (EGD) WITH PROPOFOL;  Surgeon: Arta Silence, MD;  Location: [facility];  Service: Endoscopy;  Laterality: N/A;   HYSTEROSCOPY     LAPAROSCOPIC APPENDECTOMY N/A 09/23/2015   Procedure: APPENDECTOMY LAPAROSCOPIC;  Surgeon: Georganna Skeans, MD;  Location: [facility];  Service: General;  Laterality: N/A;   [place], 1997   x 2 - In 1997, noted to have extensive pelvic adhesions and BL tubal obstruction   POLYPECTOMY  02/15/2020   Procedure: POLYPECTOMY;   Surgeon: Arta Silence, MD;  Location: [facility];  Service: Endoscopy;;   TRANSTHORACIC ECHOCARDIOGRAM  06/2014    EF 55-60%.  No RWMA. Gr 1 DD. Cannot R/o PFO (consider bubble study).   UPPER GASTROINTESTINAL ENDOSCOPY  12/18/2009    Social History   Socioeconomic History   Marital status: Divorced    Spouse name: Not on file   Number of children: 0   Years of education: 12   Highest education level: High school graduate  Occupational History   Occupation: Electronic record    Comment: [employer]  Tobacco Use   Smoking status: Former Smoker    Packs/day: 3.00    Years: 50.00    Pack years: 150.00    Types: Cigarettes    Quit date: 05/10/2015    Years since quitting: 4.8   Smokeless tobacco: Never Used   Tobacco comment: Counseling sheet 07-2011   Vaping Use   Vaping Use: Never used  Substance and Sexual Activity   Alcohol use: No    Alcohol/week: 0.0 standard drinks   Drug use: No   Sexual activity: Never  Other Topics Concern   Not on file  Social History Narrative   Financial assistance approved for 100% discount at [facility] and has [facility] card per [employer]   01/15/2010      Diet- N/A   Caffeine- Goody powders   Married-Divorced   [employer] and lives with mother   Pets-2   Current/past profession-[employer]   Exercise-No   Living will-No   DNR-No   POA/HPOA-No         Social Determinants of Health   Financial Resource Strain:    Difficulty of Paying Living Expenses: Not on file  Food Insecurity:    Worried About Charity fundraiser in the Last Year: Not on file   [employer] of Food in the Last Year: Not on file  Transportation Needs: No Transportation Needs   Lack of Transportation (Medical): No   Lack of Transportation (Non-Medical): No  Physical Activity:    Days of Exercise per Week: Not on file   Minutes of Exercise per Session: Not on file  Stress:    Feeling of Stress : Not on file  Social Connections:    Frequency of  Communication with Friends and Family: Not on file   Frequency of Social Gatherings with Friends and Family: Not on file   Attends Religious Services: Not on file   Active Member of Clubs or Organizations: Not on file   Attends Archivist Meetings: Not on file   Marital Status: Not on file  Intimate Partner Violence:    Fear of Current or Ex-Partner: Not on file   Emotionally Abused: Not on file   Physically Abused: Not on file   Sexually Abused: Not on file  Allergies  Allergen Reactions  . Amlodipine     Cramps   . Anoro Ellipta [Umeclidinium-Vilanterol] Other (See Comments)    Chest pain if used more than 3 consecutive days   . Codeine     REACTION: headaches  . Gabapentin     headache  . Prozac [Fluoxetine Hcl] Other (See Comments)    Suicidal ideations     Outpatient Medications Prior to Visit  Medication Sig Dispense Refill  . albuterol (PROAIR HFA) 108 (90 Base) MCG/ACT inhaler INHALE 2 PUFFS EVERY 6  HOURS AS NEEDED FOR  SHORTNESS OF BREATH OR  WHEEZING (Patient taking differently: Inhale 2 puffs into the lungs every 6 (six) hours as needed for wheezing. ) 34 g 3  . alum & mag hydroxide-simeth (MAALOX/MYLANTA) 200-200-20 MG/5ML suspension Take 30 mLs by mouth every 6 (six) hours as needed for indigestion or heartburn.     . Aspirin-Acetaminophen-Caffeine 500-325-65 MG PACK Take 1 Package by mouth daily as needed (Ache and pain).     . atorvastatin (LIPITOR) 80 MG tablet TAKE 1 TABLET BY MOUTH ONCE DAILY AT BEDTIME FOR  CHOLESTEROL (Patient taking differently: Take 80 mg by mouth at bedtime. ) 90 tablet 3  . Biotin 5000 MCG TABS Take 5,000 mcg by mouth in the morning and at bedtime.     . bisoprolol (ZEBETA) 5 MG tablet One twice daily (Patient taking differently: Take 5 mg by mouth in the morning and at bedtime. ) 60 tablet 11  . calcium elemental as carbonate (BARIATRIC TUMS ULTRA) 400 MG chewable tablet Chew 1 tablet by mouth daily as needed for  indigestion or heartburn.    . CINNAMON PO Take 1,500 mg by mouth in the morning and at bedtime. [place]    . dextromethorphan-guaiFENesin (TUSSIN DM) 10-100 MG/5ML liquid Take 30 mLs by mouth every 4 (four) hours as needed for cough.     . estradiol (ESTRACE VAGINAL) 0.1 MG/GM vaginal cream Place 1 Applicatorful vaginally at bedtime. (Patient taking differently: Place 1 Applicatorful vaginally 2 (two) times a week. ) 42.5 g 12  . fluticasone-salmeterol (ADVAIR HFA) 230-21 MCG/ACT inhaler Inhale 2 puffs into the lungs 2 (two) times daily. 12 g 6  . glipiZIDE (GLUCOTROL XL) 5 MG 24 hr tablet TAKE 1 TABLET BY MOUTH  DAILY WITH BREAKFAST (Patient taking differently: Take 5 mg by mouth daily with breakfast. ) 90 tablet 1  . glucose blood (ONETOUCH VERIO) test strip Use to check blood sugar twice daily. Dx: E11.9 300 each 1  . guaiFENesin (MUCINEX) 600 MG 12 hr tablet Take 600 mg by mouth every 12 (twelve) hours.    . hydrALAZINE (APRESOLINE) 50 MG tablet TAKE 1 TABLET BY MOUTH 3  TIMES DAILY (Patient taking differently: Take 50 mg by mouth 3 (three) times daily. ) 270 tablet 3  . ibuprofen (ADVIL,MOTRIN) 200 MG tablet Take 600 mg by mouth every 12 (twelve) hours as needed for moderate pain.     . ipratropium-albuterol (DUONEB) 0.5-2.5 (3) MG/3ML SOLN Take 3 mLs by nebulization every 6 (six) hours as needed. 360 mL 6  . Lancets (ONETOUCH DELICA PLUS LANCET33G) MISC USE TO CHECK BLOOD SUGAR  TWICE DAILY 200 each 3  . levothyroxine (SYNTHROID) 137 MCG tablet TAKE 1 TABLET BY MOUTH  DAILY BEFORE BREAKFAST (Patient taking differently: Take 137 mcg by mouth daily before breakfast. ) 90 tablet 2  . LORazepam (ATIVAN) 0.5 MG tablet TAKE 1 TABLET BY MOUTH 3 TIMES A DAY AS NEEDED FOR ANXIETY OR   NERVOUSNESS (Patient taking differently: Take 0.5 mg by mouth daily as needed for anxiety. ) 90 tablet 2   losartan (COZAAR) 100 MG tablet TAKE 1 TABLET BY MOUTH  DAILY (Patient taking differently: Take 100 mg by mouth daily.  ) 90 tablet 3   Magnesium 250 MG TABS Take 250 mg by mouth in the morning and at bedtime.     omeprazole (PRILOSEC) 20 MG capsule Take 30- 60 min before your first and last meals of the day (Patient taking differently: Take 20 mg by mouth 2 (two) times daily before a meal. Take 30- 60 min before your first and last meals of the day) 180 capsule 3   Phenyleph-Doxylamine-DM-APAP (VICKS NYQUIL SEVERE COLD & FLU) 5-6.25-10-325 MG TABS Take 2 tablets by mouth daily as needed (cold, cough and congestion).     Respiratory Therapy Supplies (NEBULIZER/TUBING/MOUTHPIECE) KIT 1 application by Does not apply route every 6 (six) hours as needed. FAX TO [phone] 1 kit 11   Tiotropium Bromide Monohydrate (SPIRIVA RESPIMAT) 2.5 MCG/ACT AERS USE 2 INHALATIONS BY MOUTH  ONCE DAILY (Patient taking differently: Inhale 2 puffs into the lungs daily. ) 12 g 3   valACYclovir (VALTREX) 500 MG tablet TAKE 1 TABLET BY MOUTH  DAILY (Patient taking differently: Take 500 mg by mouth daily. ) 90 tablet 3   Facility-Administered Medications Prior to Visit  Medication Dose Route Frequency Provider Last Rate Last Admin   albuterol (VENTOLIN HFA) 108 (90 Base) MCG/ACT inhaler 2 puff  2 puff Inhalation Once [name], NP        Review of Systems  Constitutional: Negative for chills, fever, malaise/fatigue and weight loss.  HENT: Negative for hearing loss, sore throat and tinnitus.   Eyes: Negative for blurred vision and double vision.  Respiratory: Positive for cough and shortness of breath. Negative for hemoptysis, sputum production, wheezing and stridor.   Cardiovascular: Negative for chest pain, palpitations, orthopnea, leg swelling and PND.  Gastrointestinal: Negative for abdominal pain, constipation, diarrhea, heartburn, nausea and vomiting.  Genitourinary: Negative for dysuria, hematuria and urgency.  Musculoskeletal: Negative for joint pain and myalgias.  Skin: Negative for itching and rash.    Neurological: Negative for dizziness, tingling, weakness and headaches.  Endo/Heme/Allergies: Negative for environmental allergies. Does not bruise/bleed easily.  Psychiatric/Behavioral: Negative for depression. The patient is not nervous/anxious and does not have insomnia.   All other systems reviewed and are negative.    Objective:  Physical Exam Vitals reviewed.  Constitutional:      General: She is not in acute distress.    Appearance: She is well-developed.  HENT:     Head: Normocephalic and atraumatic.  Eyes:     General: No scleral icterus.    Conjunctiva/sclera: Conjunctivae normal.     Pupils: Pupils are equal, round, and reactive to light.  Neck:     Vascular: No JVD.     Trachea: No tracheal deviation.  Cardiovascular:     Rate and Rhythm: Normal rate and regular rhythm.     Heart sounds: Normal heart sounds. No murmur heard.   Pulmonary:     Effort: Pulmonary effort is normal. No tachypnea, accessory muscle usage or respiratory distress.     Breath sounds: Normal breath sounds. No stridor. No wheezing, rhonchi or rales.  Abdominal:     General: Bowel sounds are normal. There is no distension.     Palpations: Abdomen is soft.     Tenderness: There is no abdominal tenderness.  Musculoskeletal:  General: No tenderness.     Cervical back: Neck supple.  Lymphadenopathy:     Cervical: No cervical adenopathy.  Skin:    General: Skin is warm and dry.     Capillary Refill: Capillary refill takes less than 2 seconds.     Findings: No rash.  Neurological:     Mental Status: She is alert and oriented to person, place, and time.  Psychiatric:        Behavior: Behavior normal.      Vitals:   03/01/20 1200  BP: 140/80  Pulse: 80  Temp: 97.8 F (36.6 C)  TempSrc: Tympanic  SpO2: 97%  Weight: 182 lb (82.6 kg)   97% on RA BMI Readings from Last 3 Encounters:  03/01/20 32.24 kg/m  02/20/20 33.20 kg/m  02/15/20 31.89 kg/m   Wt Readings from Last 3  Encounters:  03/01/20 182 lb (82.6 kg)  02/20/20 187 lb 6.4 oz (85 kg)  02/15/20 180 lb (81.6 kg)     CBC    Component Value Date/Time   WBC 8.8 09/28/2019 1211   RBC 4.39 09/28/2019 1211   HGB 11.8 09/28/2019 1211   HGB 12.1 10/08/2015 1407   HCT 37.7 09/28/2019 1211   HCT 38.1 10/08/2015 1407   PLT 277 09/28/2019 1211   PLT 633 (H) 10/08/2015 1407   MCV 85.9 09/28/2019 1211   MCV 90 10/08/2015 1407   MCH 26.9 (L) 09/28/2019 1211   MCHC 31.3 (L) 09/28/2019 1211   RDW 15.3 (H) 09/28/2019 1211   RDW 14.8 10/08/2015 1407   LYMPHSABS 1,778 09/28/2019 1211   LYMPHSABS 1.7 10/08/2015 1407   MONOABS 0.7 02/07/2019 1548   EOSABS 299 09/28/2019 1211   EOSABS 0.2 10/08/2015 1407   BASOSABS 26 09/28/2019 1211   BASOSABS 0.0 10/08/2015 1407    Chest Imaging: 02/20/2020 lung cancer screening CT: She has a 3.9 cm left lower lobe mass concerning for malignancy versus infection.  There is some areas around the lesion that looks more inflammatory than specifically related to mass.  I think the next best steps are PET scan imaging which have been ordered.  We will follow up on this images and make appropriate recommendations. The patient's images have been independently reviewed by me.    Pulmonary Functions Testing Results: PFT Results Latest Ref Rng & Units 04/26/2018  FVC-Pre L 1.56  FVC-Predicted Pre % 49  FVC-Post L 1.88  FVC-Predicted Post % 60  Pre FEV1/FVC % % 45  Post FEV1/FCV % % 46  FEV1-Pre L 0.70  FEV1-Predicted Pre % 29  FEV1-Post L 0.87  DLCO uncorrected ml/min/mmHg 16.94  DLCO UNC% % 73  DLVA Predicted % 115  TLC L 5.62  TLC % Predicted % 114  RV % Predicted % 185    FeNO:   Pathology:   Echocardiogram:   Heart Catheterization:     Assessment & Plan:     ICD-10-CM   1. Encounter for screening for lung cancer  Z12.2   2. Lung mass  R91.8   3. Former smoker  Z87.891   4. Stage 3 severe COPD by GOLD classification [facility])  J44.9      Discussion:  64 year old female, longstanding history of smoking quit 2014, very severe COPD at baseline.  She had abnormal screening CT of the lung.  There was a new lung mass within the lower lobe.  This is concerning for primary bronchogenic carcinoma.  Due to the surrounding inflammatory changes I did explain to the patient  that after the PET scan we would have a better idea whether or not this was potentially an infectious or inflammatory lesion versus a concern for malignancy.  If the lesion is PET avid we will need to consider bronchoscopy for tissue sampling and biopsy.  Plan: Patient is very adamant that she would not want chemotherapy. Therefore based on the PET scan we will make recommendations regarding tissue biopsy. Due to the severity of her lung disease with an FEV1 of less than 1 L likely not to tolerate lobectomy. Therefore for tissue sampling may be best to consider navigational bronchoscopy. If this is needed she will need a super D noncontrasted CT of the chest which we will order pending PET scan results. We discussed all of these neck steps today in the office and she is agreeable to proceed if needed. Continue current Advair plus Spiriva for COPD management.    Current Outpatient Medications:    albuterol (PROAIR HFA) 108 (90 Base) MCG/ACT inhaler, INHALE 2 PUFFS EVERY 6  HOURS AS NEEDED FOR  SHORTNESS OF BREATH OR  WHEEZING (Patient taking differently: Inhale 2 puffs into the lungs every 6 (six) hours as needed for wheezing. ), Disp: 34 g, Rfl: 3   alum & mag hydroxide-simeth (MAALOX/MYLANTA) 200-200-20 MG/5ML suspension, Take 30 mLs by mouth every 6 (six) hours as needed for indigestion or heartburn. , Disp: , Rfl:    Aspirin-Acetaminophen-Caffeine 500-325-65 MG PACK, Take 1 Package by mouth daily as needed (Ache and pain). , Disp: , Rfl:    atorvastatin (LIPITOR) 80 MG tablet, TAKE 1 TABLET BY MOUTH ONCE DAILY AT BEDTIME FOR  CHOLESTEROL (Patient taking  differently: Take 80 mg by mouth at bedtime. ), Disp: 90 tablet, Rfl: 3   Biotin 5000 MCG TABS, Take 5,000 mcg by mouth in the morning and at bedtime. , Disp: , Rfl:    bisoprolol (ZEBETA) 5 MG tablet, One twice daily (Patient taking differently: Take 5 mg by mouth in the morning and at bedtime. ), Disp: 60 tablet, Rfl: 11   calcium elemental as carbonate (BARIATRIC TUMS ULTRA) 400 MG chewable tablet, Chew 1 tablet by mouth daily as needed for indigestion or heartburn., Disp: , Rfl:    CINNAMON PO, Take 1,500 mg by mouth in the morning and at bedtime. [place], Disp: , Rfl:    dextromethorphan-guaiFENesin (TUSSIN DM) 10-100 MG/5ML liquid, Take 30 mLs by mouth every 4 (four) hours as needed for cough. , Disp: , Rfl:    estradiol (ESTRACE VAGINAL) 0.1 MG/GM vaginal cream, Place 1 Applicatorful vaginally at bedtime. (Patient taking differently: Place 1 Applicatorful vaginally 2 (two) times a week. ), Disp: 42.5 g, Rfl: 12   fluticasone-salmeterol (ADVAIR HFA) 230-21 MCG/ACT inhaler, Inhale 2 puffs into the lungs 2 (two) times daily., Disp: 12 g, Rfl: 6   glipiZIDE (GLUCOTROL XL) 5 MG 24 hr tablet, TAKE 1 TABLET BY MOUTH  DAILY WITH BREAKFAST (Patient taking differently: Take 5 mg by mouth daily with breakfast. ), Disp: 90 tablet, Rfl: 1   glucose blood (ONETOUCH VERIO) test strip, Use to check blood sugar twice daily. Dx: E11.9, Disp: 300 each, Rfl: 1   guaiFENesin (MUCINEX) 600 MG 12 hr tablet, Take 600 mg by mouth every 12 (twelve) hours., Disp: , Rfl:    hydrALAZINE (APRESOLINE) 50 MG tablet, TAKE 1 TABLET BY MOUTH 3  TIMES DAILY (Patient taking differently: Take 50 mg by mouth 3 (three) times daily. ), Disp: 270 tablet, Rfl: 3   ibuprofen (ADVIL,MOTRIN) 200 MG  tablet, Take 600 mg by mouth every 12 (twelve) hours as needed for moderate pain. , Disp: , Rfl:    ipratropium-albuterol (DUONEB) 0.5-2.5 (3) MG/3ML SOLN, Take 3 mLs by nebulization every 6 (six) hours as needed., Disp: 360 mL, Rfl:  6   Lancets (ONETOUCH DELICA PLUS [id number]) MISC, USE TO CHECK BLOOD SUGAR  TWICE DAILY, Disp: 200 each, Rfl: 3   levothyroxine (SYNTHROID) 137 MCG tablet, TAKE 1 TABLET BY MOUTH  DAILY BEFORE BREAKFAST (Patient taking differently: Take 137 mcg by mouth daily before breakfast. ), Disp: 90 tablet, Rfl: 2   LORazepam (ATIVAN) 0.5 MG tablet, TAKE 1 TABLET BY MOUTH 3 TIMES A DAY AS NEEDED FOR ANXIETY OR NERVOUSNESS (Patient taking differently: Take 0.5 mg by mouth daily as needed for anxiety. ), Disp: 90 tablet, Rfl: 2   losartan (COZAAR) 100 MG tablet, TAKE 1 TABLET BY MOUTH  DAILY (Patient taking differently: Take 100 mg by mouth daily. ), Disp: 90 tablet, Rfl: 3   Magnesium 250 MG TABS, Take 250 mg by mouth in the morning and at bedtime., Disp: , Rfl:    omeprazole (PRILOSEC) 20 MG capsule, Take 30- 60 min before your first and last meals of the day (Patient taking differently: Take 20 mg by mouth 2 (two) times daily before a meal. Take 30- 60 min before your first and last meals of the day), Disp: 180 capsule, Rfl: 3   Phenyleph-Doxylamine-DM-APAP (VICKS NYQUIL SEVERE COLD & FLU) 5-6.25-10-325 MG TABS, Take 2 tablets by mouth daily as needed (cold, cough and congestion)., Disp: , Rfl:    Respiratory Therapy Supplies (NEBULIZER/TUBING/MOUTHPIECE) KIT, 1 application by Does not apply route every 6 (six) hours as needed. FAX TO [phone], Disp: 1 kit, Rfl: 11   Tiotropium Bromide Monohydrate (SPIRIVA RESPIMAT) 2.5 MCG/ACT AERS, USE 2 INHALATIONS BY MOUTH  ONCE DAILY (Patient taking differently: Inhale 2 puffs into the lungs daily. ), Disp: 12 g, Rfl: 3   valACYclovir (VALTREX) 500 MG tablet, TAKE 1 TABLET BY MOUTH  DAILY (Patient taking differently: Take 500 mg by mouth daily. ), Disp: 90 tablet, Rfl: 3  Current Facility-Administered Medications:    albuterol (VENTOLIN HFA) 108 (90 Base) MCG/ACT inhaler 2 puff, 2 puff, Inhalation, Once, [place]anks, Carlos American, NP  I spent 42 minutes dedicated  to the care of this patient on the date of this encounter to include pre-visit review of records, face-to-face time with the patient discussing conditions above, post visit ordering of testing, clinical documentation with the electronic health record, making appropriate referrals as documented, and communicating necessary findings to members of the patients care team.   Garner Nash, DO [facility] 03/01/2020 12:37 PM

## 2020-03-01 NOTE — Progress Notes (Signed)
Synopsis: Referred in November 2021 for normal CT chest by Andree Moro, DO  Subjective:   PATIENT ID: Grace [name] GENDER: female DOB: [birth year], MRN: [id number]  Chief Complaint  Patient presents with  . establish care    follow up on lung nodule.     64 year old female, past medical history of Barrett's with high-grade dysplasia, COPD, diabetes, reflux, longstanding tobacco abuse quit in 2014.  Patient was enrolled in lung cancer screening program.  She had an abnormal lung cancer screening CT which led to recommendations for PET scan imaging.  This has been ordered and scheduled for tomorrow.  At baseline she has severe COPD, pulmonary function test last completed in 2020 which revealed a ratio of 46 and an FEV1 of 0.87 L, 35% predicted with a 23% bronchodilator response, evidence of air trapping with an RV of 185% and a DLCO of 73%.  OV 03/01/2020: Here today for review of lung cancer screening CT which completed on 02/20/2020.  This revealed a lung RADS B with a 3.9 cm left lower lobe mass.  There is concern for possible under line primary bronchogenic carcinoma versus infection.  Per patient states that she is not had any significant fevers but has had increased cough and sputum production.  This was approximately 10 days ago she did have her PET scan scheduled already for tomorrow.     Past Medical History:  Diagnosis Date  . Abnormal vaginal bleeding   . Asthma   . Barrett's esophagus    with high grade dysplasia per endoscopy 05/2005 // followed by Dr. [name] ([facility])  . Chronic back pain   . COPD (chronic obstructive pulmonary disease) ([place])   . Diabetes mellitus without complication ([place])   . Duodenal ulcer disease    thought to be contributed at least partly by overuse of NSAIDs and salicylates (goody powdr)  . Endometrial polyp 03/2003   s/p resection in 03/2003. Path showing submucosal leiomyoma and benign  proliferative type endometrium  . Family history of  premature CAD 05/19/2018  . Fibroid uterus    s/p myomectomy x 2  . GERD (gastroesophageal reflux disease)   . H/O failed moderate sedation   . Hiatal hernia   . History of CVA (cerebrovascular accident) 10/2000  . 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 Hyperlipidemia   . Hypertension   . Hypothyroidism    with hx of multinodular goiter (noted on neck US in 11/2002 - showing diffuse nodularity and inhomogenous texture diffusely BL)  . Internal hemorrhoids    grade 2 per colonoscopy in 09/2006 - repeat colonoscopy rec in 5-10 years.  . Migraine   . Nevus   . Personal history of failed conscious sedation 08/11/2011  . Tachycardia   . Thyroid disease   . Tobacco abuse Jan 2014   quit     Family History  Problem Relation Age of Onset  . COPD Mother   . Heart disease Father 57       had 5 heart attacks, first in late 7s  . COPD Father      Past Surgical History:  Procedure Laterality Date  . BIOPSY  02/15/2020   Procedure: BIOPSY;  Surgeon: Arta Silence, MD;  Location: [facility];  Service: Endoscopy;;  . CHOLECYSTECTOMY    . COLONOSCOPY  02/07/2010  . COLONOSCOPY WITH PROPOFOL N/A 02/15/2020  Procedure: COLONOSCOPY WITH PROPOFOL;  Surgeon: Arta Silence, MD;  Location: [facility];  Service: Endoscopy;  Laterality: N/A;  . ESOPHAGOGASTRODUODENOSCOPY (EGD) WITH PROPOFOL N/A 02/15/2020   Procedure: ESOPHAGOGASTRODUODENOSCOPY (EGD) WITH PROPOFOL;  Surgeon: Arta Silence, MD;  Location: [facility];  Service: Endoscopy;  Laterality: N/A;  . HYSTEROSCOPY    . LAPAROSCOPIC APPENDECTOMY N/A 09/23/2015   Procedure: APPENDECTOMY LAPAROSCOPIC;  Surgeon: Georganna Skeans, MD;  Location: [facility];  Service: General;  Laterality: N/A;  . [place]   x 2 - In 1997, noted to have extensive pelvic adhesions and BL tubal obstruction  . POLYPECTOMY  02/15/2020   Procedure: POLYPECTOMY;   Surgeon: Arta Silence, MD;  Location: [facility];  Service: Endoscopy;;  . TRANSTHORACIC ECHOCARDIOGRAM  06/2014    EF 55-60%.  No RWMA. Gr 1 DD. Cannot R/o PFO (consider bubble study).  Marland Kitchen UPPER GASTROINTESTINAL ENDOSCOPY  12/18/2009    Social History   Socioeconomic History  . Marital status: Divorced    Spouse name: Not on file  . Number of children: 0  . Years of education: 64  . Highest education level: High school graduate  Occupational History  . Occupation: Electronic record    Comment: [employer]  Tobacco Use  . Smoking status: Former Smoker    Packs/day: 3.00    Years: 50.00    Pack years: 150.00    Types: Cigarettes    Quit date: 05/10/2015    Years since quitting: 4.8  . Smokeless tobacco: Never Used  . Tobacco comment: Counseling sheet [id number]   Vaping Use  . Vaping Use: Never used  Substance and Sexual Activity  . Alcohol use: No    Alcohol/week: 0.0 standard drinks  . Drug use: No  . Sexual activity: Never  Other Topics Concern  . Not on file  Social History Narrative   Financial assistance approved for 100% discount at [facility] and has [facility] card per Bonna Gains   01/15/2010      Diet- N/A   Caffeine- Goody powders   Married-Divorced   [employer] and lives with mother   Pets-2   Current/past profession-[employer]   Exercise-No   Living will-No   DNR-No   POA/HPOA-No         Social Determinants of Health   Financial Resource Strain:   . Difficulty of Paying Living Expenses: Not on file  Food Insecurity:   . Worried About Charity fundraiser in the Last Year: Not on file  . Ran Out of Food in the Last Year: Not on file  Transportation Needs: No Transportation Needs  . Lack of Transportation (Medical): No  . Lack of Transportation (Non-Medical): No  Physical Activity:   . Days of Exercise per Week: Not on file  . Minutes of Exercise per Session: Not on file  Stress:   . Feeling of Stress : Not on file  Social Connections:   . Frequency of  Communication with Friends and Family: Not on file  . Frequency of Social Gatherings with Friends and Family: Not on file  . Attends Religious Services: Not on file  . Active Member of Clubs or Organizations: Not on file  . Attends Archivist Meetings: Not on file  . Marital Status: Not on file  Intimate Partner Violence:   . Fear of Current or Ex-Partner: Not on file  . Emotionally Abused: Not on file  . Physically Abused: Not on file  . Sexually Abused: Not on file  Allergies  Allergen Reactions  . Amlodipine     Cramps   . Anoro Ellipta [Umeclidinium-Vilanterol] Other (See Comments)    Chest pain if used more than 3 consecutive days   . Codeine     REACTION: headaches  . Gabapentin     headache  . Prozac [Fluoxetine Hcl] Other (See Comments)    Suicidal ideations     Outpatient Medications Prior to Visit  Medication Sig Dispense Refill  . albuterol (PROAIR HFA) 108 (90 Base) MCG/ACT inhaler INHALE 2 PUFFS EVERY 6  HOURS AS NEEDED FOR  SHORTNESS OF BREATH OR  WHEEZING (Patient taking differently: Inhale 2 puffs into the lungs every 6 (six) hours as needed for wheezing. ) 34 g 3  . alum & mag hydroxide-simeth (MAALOX/MYLANTA) 200-200-20 MG/5ML suspension Take 30 mLs by mouth every 6 (six) hours as needed for indigestion or heartburn.     . Aspirin-Acetaminophen-Caffeine 599-357-01 MG PACK Take 1 Package by mouth daily as needed (Ache and pain).     Marland Kitchen atorvastatin (LIPITOR) 80 MG tablet TAKE 1 TABLET BY MOUTH ONCE DAILY AT BEDTIME FOR  CHOLESTEROL (Patient taking differently: Take 80 mg by mouth at bedtime. ) 90 tablet 3  . Biotin 5000 MCG TABS Take 5,000 mcg by mouth in the morning and at bedtime.     . bisoprolol (ZEBETA) 5 MG tablet One twice daily (Patient taking differently: Take 5 mg by mouth in the morning and at bedtime. ) 60 tablet 11  . calcium elemental as carbonate (BARIATRIC TUMS ULTRA) 400 MG chewable tablet Chew 1 tablet by mouth daily as needed for  indigestion or heartburn.    Marland Kitchen CINNAMON PO Take 1,500 mg by mouth in the morning and at bedtime. [place]    . dextromethorphan-guaiFENesin (TUSSIN DM) 10-100 MG/5ML liquid Take 30 mLs by mouth every 4 (four) hours as needed for cough.     . estradiol (ESTRACE VAGINAL) 0.1 MG/GM vaginal cream Place 1 Applicatorful vaginally at bedtime. (Patient taking differently: Place 1 Applicatorful vaginally 2 (two) times a week. ) 42.5 g 12  . fluticasone-salmeterol (ADVAIR HFA) 230-21 MCG/ACT inhaler Inhale 2 puffs into the lungs 2 (two) times daily. 12 g 6  . glipiZIDE (GLUCOTROL XL) 5 MG 24 hr tablet TAKE 1 TABLET BY MOUTH  DAILY WITH BREAKFAST (Patient taking differently: Take 5 mg by mouth daily with breakfast. ) 90 tablet 1  . glucose blood (ONETOUCH VERIO) test strip Use to check blood sugar twice daily. Dx: E11.9 300 each 1  . guaiFENesin (MUCINEX) 600 MG 12 hr tablet Take 600 mg by mouth every 12 (twelve) hours.    . hydrALAZINE (APRESOLINE) 50 MG tablet TAKE 1 TABLET BY MOUTH 3  TIMES DAILY (Patient taking differently: Take 50 mg by mouth 3 (three) times daily. ) 270 tablet 3  . ibuprofen (ADVIL,MOTRIN) 200 MG tablet Take 600 mg by mouth every 12 (twelve) hours as needed for moderate pain.     Marland Kitchen ipratropium-albuterol (DUONEB) 0.5-2.5 (3) MG/3ML SOLN Take 3 mLs by nebulization every 6 (six) hours as needed. 360 mL 6  . Lancets (ONETOUCH DELICA PLUS [id number]) MISC USE TO CHECK BLOOD SUGAR  TWICE DAILY 200 each 3  . levothyroxine (SYNTHROID) 137 MCG tablet TAKE 1 TABLET BY MOUTH  DAILY BEFORE BREAKFAST (Patient taking differently: Take 137 mcg by mouth daily before breakfast. ) 90 tablet 2  . LORazepam (ATIVAN) 0.5 MG tablet TAKE 1 TABLET BY MOUTH 3 TIMES A DAY AS NEEDED FOR ANXIETY OR  NERVOUSNESS (Patient taking differently: Take 0.5 mg by mouth daily as needed for anxiety. ) 90 tablet 2  . losartan (COZAAR) 100 MG tablet TAKE 1 TABLET BY MOUTH  DAILY (Patient taking differently: Take 100 mg by mouth daily.  ) 90 tablet 3  . Magnesium 250 MG TABS Take 250 mg by mouth in the morning and at bedtime.    Marland Kitchen omeprazole (PRILOSEC) 20 MG capsule Take 30- 60 min before your first and last meals of the day (Patient taking differently: Take 20 mg by mouth 2 (two) times daily before a meal. Take 30- 60 min before your first and last meals of the day) 180 capsule 3  . Phenyleph-Doxylamine-DM-APAP (VICKS NYQUIL SEVERE COLD & FLU) 5-6.25-10-325 MG TABS Take 2 tablets by mouth daily as needed (cold, cough and congestion).    Marland Kitchen Respiratory Therapy Supplies (NEBULIZER/TUBING/MOUTHPIECE) KIT 1 application by Does not apply route every 6 (six) hours as needed. FAX TO [phone] 1 kit 11  . Tiotropium Bromide Monohydrate (SPIRIVA RESPIMAT) 2.5 MCG/ACT AERS USE 2 INHALATIONS BY MOUTH  ONCE DAILY (Patient taking differently: Inhale 2 puffs into the lungs daily. ) 12 g 3  . valACYclovir (VALTREX) 500 MG tablet TAKE 1 TABLET BY MOUTH  DAILY (Patient taking differently: Take 500 mg by mouth daily. ) 90 tablet 3   Facility-Administered Medications Prior to Visit  Medication Dose Route Frequency Provider Last Rate Last Admin  . albuterol (VENTOLIN HFA) 108 (90 Base) MCG/ACT inhaler 2 puff  2 puff Inhalation Once [name], NP        Review of Systems  Constitutional: Negative for chills, fever, malaise/fatigue and weight loss.  HENT: Negative for hearing loss, sore throat and tinnitus.   Eyes: Negative for blurred vision and double vision.  Respiratory: Positive for cough and shortness of breath. Negative for hemoptysis, sputum production, wheezing and stridor.   Cardiovascular: Negative for chest pain, palpitations, orthopnea, leg swelling and PND.  Gastrointestinal: Negative for abdominal pain, constipation, diarrhea, heartburn, nausea and vomiting.  Genitourinary: Negative for dysuria, hematuria and urgency.  Musculoskeletal: Negative for joint pain and myalgias.  Skin: Negative for itching and rash.   Neurological: Negative for dizziness, tingling, weakness and headaches.  Endo/Heme/Allergies: Negative for environmental allergies. Does not bruise/bleed easily.  Psychiatric/Behavioral: Negative for depression. The patient is not nervous/anxious and does not have insomnia.   All other systems reviewed and are negative.    Objective:  Physical Exam Vitals reviewed.  Constitutional:      General: She is not in acute distress.    Appearance: She is well-developed.  HENT:     Head: Normocephalic and atraumatic.  Eyes:     General: No scleral icterus.    Conjunctiva/sclera: Conjunctivae normal.     Pupils: Pupils are equal, round, and reactive to light.  Neck:     Vascular: No JVD.     Trachea: No tracheal deviation.  Cardiovascular:     Rate and Rhythm: Normal rate and regular rhythm.     Heart sounds: Normal heart sounds. No murmur heard.   Pulmonary:     Effort: Pulmonary effort is normal. No tachypnea, accessory muscle usage or respiratory distress.     Breath sounds: Normal breath sounds. No stridor. No wheezing, rhonchi or rales.  Abdominal:     General: Bowel sounds are normal. There is no distension.     Palpations: Abdomen is soft.     Tenderness: There is no abdominal tenderness.  Musculoskeletal:  General: No tenderness.     Cervical back: Neck supple.  Lymphadenopathy:     Cervical: No cervical adenopathy.  Skin:    General: Skin is warm and dry.     Capillary Refill: Capillary refill takes less than 2 seconds.     Findings: No rash.  Neurological:     Mental Status: She is alert and oriented to person, place, and time.  Psychiatric:        Behavior: Behavior normal.      Vitals:   03/01/20 1200  BP: 140/80  Pulse: 80  Temp: 97.8 F (36.6 C)  TempSrc: Tympanic  SpO2: 97%  Weight: 182 lb (82.6 kg)   97% on RA BMI Readings from Last 3 Encounters:  03/01/20 32.24 kg/m  02/20/20 33.20 kg/m  02/15/20 31.89 kg/m   Wt Readings from Last 3  Encounters:  03/01/20 182 lb (82.6 kg)  02/20/20 187 lb 6.4 oz (85 kg)  02/15/20 180 lb (81.6 kg)     CBC    Component Value Date/Time   WBC 8.8 09/28/2019 1211   RBC 4.39 09/28/2019 1211   HGB 11.8 09/28/2019 1211   HGB 12.1 10/08/2015 1407   HCT 37.7 09/28/2019 1211   HCT 38.1 10/08/2015 1407   PLT 277 09/28/2019 1211   PLT 633 (H) 10/08/2015 1407   MCV 85.9 09/28/2019 1211   MCV 90 10/08/2015 1407   MCH 26.9 (L) 09/28/2019 1211   MCHC 31.3 (L) 09/28/2019 1211   RDW 15.3 (H) 09/28/2019 1211   RDW 14.8 10/08/2015 1407   LYMPHSABS 1,778 09/28/2019 1211   LYMPHSABS 1.7 10/08/2015 1407   MONOABS 0.7 02/07/2019 1548   EOSABS 299 09/28/2019 1211   EOSABS 0.2 10/08/2015 1407   BASOSABS 26 09/28/2019 1211   BASOSABS 0.0 10/08/2015 1407    Chest Imaging: 02/20/2020 lung cancer screening CT: She has a 3.9 cm left lower lobe mass concerning for malignancy versus infection.  There is some areas around the lesion that looks more inflammatory than specifically related to mass.  I think the next best steps are PET scan imaging which have been ordered.  We will follow up on this images and make appropriate recommendations. The patient's images have been independently reviewed by me.    Pulmonary Functions Testing Results: PFT Results Latest Ref Rng & Units 04/26/2018  FVC-Pre L 1.56  FVC-Predicted Pre % 49  FVC-Post L 1.88  FVC-Predicted Post % 60  Pre FEV1/FVC % % 45  Post FEV1/FCV % % 46  FEV1-Pre L 0.70  FEV1-Predicted Pre % 29  FEV1-Post L 0.87  DLCO uncorrected ml/min/mmHg 16.94  DLCO UNC% % 73  DLVA Predicted % 115  TLC L 5.62  TLC % Predicted % 114  RV % Predicted % 185    FeNO:   Pathology:   Echocardiogram:   Heart Catheterization:     Assessment & Plan:     ICD-10-CM   1. Encounter for screening for lung cancer  Z12.2   2. Lung mass  R91.8   3. Former smoker  Z87.891   4. Stage 3 severe COPD by GOLD classification [facility])  J44.9      Discussion:  64 year old female, longstanding history of smoking quit 2014, very severe COPD at baseline.  She had abnormal screening CT of the lung.  There was a new lung mass within the lower lobe.  This is concerning for primary bronchogenic carcinoma.  Due to the surrounding inflammatory changes I did explain to the patient  that after the PET scan we would have a better idea whether or not this was potentially an infectious or inflammatory lesion versus a concern for malignancy.  If the lesion is PET avid we will need to consider bronchoscopy for tissue sampling and biopsy.  Plan: Patient is very adamant that she would not want chemotherapy. Therefore based on the PET scan we will make recommendations regarding tissue biopsy. Due to the severity of her lung disease with an FEV1 of less than 1 L likely not to tolerate lobectomy. Therefore for tissue sampling may be best to consider navigational bronchoscopy. If this is needed she will need a super D noncontrasted CT of the chest which we will order pending PET scan results. We discussed all of these neck steps today in the office and she is agreeable to proceed if needed. Continue current Advair plus Spiriva for COPD management.    Current Outpatient Medications:  .  albuterol (PROAIR HFA) 108 (90 Base) MCG/ACT inhaler, INHALE 2 PUFFS EVERY 6  HOURS AS NEEDED FOR  SHORTNESS OF BREATH OR  WHEEZING (Patient taking differently: Inhale 2 puffs into the lungs every 6 (six) hours as needed for wheezing. ), Disp: 34 g, Rfl: 3 .  alum & mag hydroxide-simeth (MAALOX/MYLANTA) 200-200-20 MG/5ML suspension, Take 30 mLs by mouth every 6 (six) hours as needed for indigestion or heartburn. , Disp: , Rfl:  .  Aspirin-Acetaminophen-Caffeine 500-325-65 MG PACK, Take 1 Package by mouth daily as needed (Ache and pain). , Disp: , Rfl:  .  atorvastatin (LIPITOR) 80 MG tablet, TAKE 1 TABLET BY MOUTH ONCE DAILY AT BEDTIME FOR  CHOLESTEROL (Patient taking  differently: Take 80 mg by mouth at bedtime. ), Disp: 90 tablet, Rfl: 3 .  Biotin 5000 MCG TABS, Take 5,000 mcg by mouth in the morning and at bedtime. , Disp: , Rfl:  .  bisoprolol (ZEBETA) 5 MG tablet, One twice daily (Patient taking differently: Take 5 mg by mouth in the morning and at bedtime. ), Disp: 60 tablet, Rfl: 11 .  calcium elemental as carbonate (BARIATRIC TUMS ULTRA) 400 MG chewable tablet, Chew 1 tablet by mouth daily as needed for indigestion or heartburn., Disp: , Rfl:  .  CINNAMON PO, Take 1,500 mg by mouth in the morning and at bedtime. [place], Disp: , Rfl:  .  dextromethorphan-guaiFENesin (TUSSIN DM) 10-100 MG/5ML liquid, Take 30 mLs by mouth every 4 (four) hours as needed for cough. , Disp: , Rfl:  .  estradiol (ESTRACE VAGINAL) 0.1 MG/GM vaginal cream, Place 1 Applicatorful vaginally at bedtime. (Patient taking differently: Place 1 Applicatorful vaginally 2 (two) times a week. ), Disp: 42.5 g, Rfl: 12 .  fluticasone-salmeterol (ADVAIR HFA) 230-21 MCG/ACT inhaler, Inhale 2 puffs into the lungs 2 (two) times daily., Disp: 12 g, Rfl: 6 .  glipiZIDE (GLUCOTROL XL) 5 MG 24 hr tablet, TAKE 1 TABLET BY MOUTH  DAILY WITH BREAKFAST (Patient taking differently: Take 5 mg by mouth daily with breakfast. ), Disp: 90 tablet, Rfl: 1 .  glucose blood (ONETOUCH VERIO) test strip, Use to check blood sugar twice daily. Dx: E11.9, Disp: 300 each, Rfl: 1 .  guaiFENesin (MUCINEX) 600 MG 12 hr tablet, Take 600 mg by mouth every 12 (twelve) hours., Disp: , Rfl:  .  hydrALAZINE (APRESOLINE) 50 MG tablet, TAKE 1 TABLET BY MOUTH 3  TIMES DAILY (Patient taking differently: Take 50 mg by mouth 3 (three) times daily. ), Disp: 270 tablet, Rfl: 3 .  ibuprofen (ADVIL,MOTRIN) 200 MG  tablet, Take 600 mg by mouth every 12 (twelve) hours as needed for moderate pain. , Disp: , Rfl:  .  ipratropium-albuterol (DUONEB) 0.5-2.5 (3) MG/3ML SOLN, Take 3 mLs by nebulization every 6 (six) hours as needed., Disp: 360 mL, Rfl:  6 .  Lancets (ONETOUCH DELICA PLUS [id number]) MISC, USE TO CHECK BLOOD SUGAR  TWICE DAILY, Disp: 200 each, Rfl: 3 .  levothyroxine (SYNTHROID) 137 MCG tablet, TAKE 1 TABLET BY MOUTH  DAILY BEFORE BREAKFAST (Patient taking differently: Take 137 mcg by mouth daily before breakfast. ), Disp: 90 tablet, Rfl: 2 .  LORazepam (ATIVAN) 0.5 MG tablet, TAKE 1 TABLET BY MOUTH 3 TIMES A DAY AS NEEDED FOR ANXIETY OR NERVOUSNESS (Patient taking differently: Take 0.5 mg by mouth daily as needed for anxiety. ), Disp: 90 tablet, Rfl: 2 .  losartan (COZAAR) 100 MG tablet, TAKE 1 TABLET BY MOUTH  DAILY (Patient taking differently: Take 100 mg by mouth daily. ), Disp: 90 tablet, Rfl: 3 .  Magnesium 250 MG TABS, Take 250 mg by mouth in the morning and at bedtime., Disp: , Rfl:  .  omeprazole (PRILOSEC) 20 MG capsule, Take 30- 60 min before your first and last meals of the day (Patient taking differently: Take 20 mg by mouth 2 (two) times daily before a meal. Take 30- 60 min before your first and last meals of the day), Disp: 180 capsule, Rfl: 3 .  Phenyleph-Doxylamine-DM-APAP (VICKS NYQUIL SEVERE COLD & FLU) 5-6.25-10-325 MG TABS, Take 2 tablets by mouth daily as needed (cold, cough and congestion)., Disp: , Rfl:  .  Respiratory Therapy Supplies (NEBULIZER/TUBING/MOUTHPIECE) KIT, 1 application by Does not apply route every 6 (six) hours as needed. FAX TO [phone], Disp: 1 kit, Rfl: 11 .  Tiotropium Bromide Monohydrate (SPIRIVA RESPIMAT) 2.5 MCG/ACT AERS, USE 2 INHALATIONS BY MOUTH  ONCE DAILY (Patient taking differently: Inhale 2 puffs into the lungs daily. ), Disp: 12 g, Rfl: 3 .  valACYclovir (VALTREX) 500 MG tablet, TAKE 1 TABLET BY MOUTH  DAILY (Patient taking differently: Take 500 mg by mouth daily. ), Disp: 90 tablet, Rfl: 3  Current Facility-Administered Medications:  .  albuterol (VENTOLIN HFA) 108 (90 Base) MCG/ACT inhaler 2 puff, 2 puff, Inhalation, Once, [place], Carlos American, NP  I spent 42 minutes dedicated  to the care of this patient on the date of this encounter to include pre-visit review of records, face-to-face time with the patient discussing conditions above, post visit ordering of testing, clinical documentation with the electronic health record, making appropriate referrals as documented, and communicating necessary findings to members of the patients care team.   Garner Nash, DO [facility] 03/01/2020 12:37 PM

## 2020-03-02 ENCOUNTER — Ambulatory Visit (HOSPITAL_COMMUNITY)
Admission: RE | Admit: 2020-03-02 | Discharge: 2020-03-02 | Disposition: A | Discharge status: Home / Self Care | Payer: Medicare Other | Source: Ambulatory Visit | Attending: Acute Care | Admitting: Acute Care

## 2020-03-02 ENCOUNTER — Other Ambulatory Visit: Payer: Self-pay | Admitting: Nurse Practitioner

## 2020-03-02 DIAGNOSIS — R918 Other nonspecific abnormal finding of lung field: Secondary | ICD-10-CM

## 2020-03-02 DIAGNOSIS — E1140 Type 2 diabetes mellitus with diabetic neuropathy, unspecified: Secondary | ICD-10-CM

## 2020-03-02 LAB — GLUCOSE, CAPILLARY: Glucose-Capillary: 124 mg/dL — ABNORMAL HIGH (ref 70–99)

## 2020-03-02 MED ORDER — FLUDEOXYGLUCOSE F - 18 (FDG) INJECTION
9.3100 | Freq: Once | INTRAVENOUS | Status: AC | PRN
  Administered 2020-03-02: 9.31 via INTRAVENOUS

## 2020-03-05 ENCOUNTER — Telehealth: Payer: Self-pay | Admitting: Pulmonary Disease

## 2020-03-05 DIAGNOSIS — R918 Other nonspecific abnormal finding of lung field: Secondary | ICD-10-CM

## 2020-03-05 DIAGNOSIS — R590 Localized enlarged lymph nodes: Secondary | ICD-10-CM

## 2020-03-05 NOTE — Telephone Encounter (Signed)
PCCM:  I called spoke with patient regarding her recent PET scan.  We discussed bronchoscopy.  Due to the holiday this week we will plan to do procedure next week.  We also during the next few days will obtain a super D noncontrasted CT scan of the chest plan for navigational bronchoscopy.  She has a small hilar node with low PET uptake which will need to be sampled as well.  Tentative bronchoscopy day on 03/15/2020.  If we are unable to schedule at [facility] endoscopy will need to add the case on to the OR.  Garner Nash, DO [facility] 03/05/2020 7:03 PM

## 2020-03-05 NOTE — Telephone Encounter (Signed)
Sarah, Please see patient comment regarding PET scan and advise.  Thank you.

## 2020-03-06 ENCOUNTER — Other Ambulatory Visit: Payer: Self-pay | Admitting: Nurse Practitioner

## 2020-03-06 ENCOUNTER — Telehealth: Payer: Self-pay | Admitting: Pulmonary Disease

## 2020-03-06 DIAGNOSIS — E119 Type 2 diabetes mellitus without complications: Secondary | ICD-10-CM

## 2020-03-06 NOTE — Telephone Encounter (Signed)
The following has been scheduled & patient has been made aware of the upcoming appointments:  Super D CT  11/29 @ 1:30 PM COVID Test 11/29 @ 2:15 PM ENB + EBUS 12/2 @ 7:30 AM - pt to check in by 5 AM  Thank you,  [facility]

## 2020-03-07 NOTE — Progress Notes (Signed)
The results of this PET scan were called to the patient by Dr. Valeta Harms. She verbalized understanding. She is scheduled for Navigational bronch 12/2. Pt. Has concerns about cost and diagnosis. She has been reassured that we are with her throughout this process, and that there are resources though the cancer center than can help.

## 2020-03-07 NOTE — Telephone Encounter (Signed)
Dr. Valeta Harms, please see mychart message sent by pt.

## 2020-03-12 ENCOUNTER — Encounter: Payer: Self-pay | Admitting: Cardiology

## 2020-03-12 ENCOUNTER — Ambulatory Visit (INDEPENDENT_AMBULATORY_CARE_PROVIDER_SITE_OTHER)
Admission: RE | Admit: 2020-03-12 | Discharge: 2020-03-12 | Disposition: A | Discharge status: Home / Self Care | Payer: Medicare Other | Source: Ambulatory Visit | Attending: Pulmonary Disease | Admitting: Pulmonary Disease

## 2020-03-12 ENCOUNTER — Ambulatory Visit (INDEPENDENT_AMBULATORY_CARE_PROVIDER_SITE_OTHER): Payer: Medicare Other | Admitting: Cardiology

## 2020-03-12 ENCOUNTER — Other Ambulatory Visit: Payer: Self-pay

## 2020-03-12 ENCOUNTER — Other Ambulatory Visit (HOSPITAL_COMMUNITY)
Admission: RE | Admit: 2020-03-12 | Discharge: 2020-03-12 | Disposition: A | Discharge status: Home / Self Care | Payer: Medicare Other | Source: Ambulatory Visit | Attending: Pulmonary Disease | Admitting: Pulmonary Disease

## 2020-03-12 VITALS — BP 145/82 | HR 68 | Temp 96.8°F | Ht 63.0 in | Wt 185.0 lb

## 2020-03-12 DIAGNOSIS — Z01812 Encounter for preprocedural laboratory examination: Secondary | ICD-10-CM | POA: Diagnosis present

## 2020-03-12 DIAGNOSIS — Z20822 Contact with and (suspected) exposure to covid-19: Secondary | ICD-10-CM | POA: Diagnosis not present

## 2020-03-12 DIAGNOSIS — I10 Essential (primary) hypertension: Secondary | ICD-10-CM

## 2020-03-12 DIAGNOSIS — Z8249 Family history of ischemic heart disease and other diseases of the circulatory system: Secondary | ICD-10-CM | POA: Diagnosis not present

## 2020-03-12 DIAGNOSIS — E785 Hyperlipidemia, unspecified: Secondary | ICD-10-CM | POA: Diagnosis not present

## 2020-03-12 DIAGNOSIS — R918 Other nonspecific abnormal finding of lung field: Secondary | ICD-10-CM

## 2020-03-12 DIAGNOSIS — Z01818 Encounter for other preprocedural examination: Secondary | ICD-10-CM | POA: Diagnosis not present

## 2020-03-12 DIAGNOSIS — R590 Localized enlarged lymph nodes: Secondary | ICD-10-CM | POA: Diagnosis not present

## 2020-03-12 DIAGNOSIS — I2510 Atherosclerotic heart disease of native coronary artery without angina pectoris: Secondary | ICD-10-CM | POA: Diagnosis not present

## 2020-03-12 LAB — SARS CORONAVIRUS 2 (TAT 6-24 HRS): SARS Coronavirus 2: NEGATIVE

## 2020-03-12 NOTE — Progress Notes (Signed)
Primary Care Provider: Andree Moro, DO Cardiologist: Glenetta Hew, MD Electrophysiologist: None  Clinic Note: Chief Complaint  Patient presents with  . Follow-up    Never had coronary CTA done.  . Shortness of Breath    COPD Gold 3    HPI:    Grace [name] is a 64 y.o. female with a PMH notable for Severe COPD by PFTs in 2020 (former smoker), OSA on CPAP), HYPERTENSION, family history of CAD with coronary calcification on CT scan who presents today for essentially 74-monthfollow-up.  Problem List Items Addressed This Visit    Coronary artery calcification seen on CAT scan - Primary (Chronic)    For now, since she has lots of other things going forward, we will hold off on any further CAD evaluation.  However if she is indeed potentially in need of lung surgery, I think I would then proceed with coronary CTA.  This would give [place] coronary calcium score and extent of CAD.  She is not having anginal symptoms, therefore at this point ischemic evaluation as not associate warranted.  She wants to try to get the answer about the lung cancer question done.  I do think though that she will need ischemic evaluation prior to any surgeries.  Best option would be coronary CTA-CT FFR for anatomic and ischemic evaluation..      Hyperlipidemia LDL goal <100 (Chronic)    She is now back on atorvastatin 80 mg.  Last labs showed LDL of 56.  This is well within goal.  With other risk factors, is not unreasonable to try to shoot for aggressive control with LDL less than 70.  She does have coronary calcification which has not yet been fully evaluated.        Essential hypertension (Chronic)    Borderline pressures today on losartan, hydralazine and bisoprolol.  She says at home her blood pressures are better controlled than they are here today.  Low threshold to increase hydralazine to 100 mg 3 times daily if pressures do not adequately control.  Losartan is at max dose, and limited beta-blocker  titration because of COPD and resting heart rate 60 bpm.      Relevant Orders   EKG 12-Lead (Completed)   Pre-op evaluation    No acute evaluation needed for bronchoscopy, however ending results of bronchoscopy, if lung surgery is required, we would probably need to do some type of evaluation.  She is limited by her dyspnea and therefore difficult to assess true functional status.  With known coronary calcification, I think coronary CTA-FFR would be the best option for ischemic evaluation; will probably also likely check 2D echocardiogram to get baseline cardiac function perioperatively.  She would like to wait to have any of the studies done until we are sure what is needed.      Relevant Orders   EKG 12-Lead (Completed)    Other Visit Diagnoses    Family history of premature CAD       Relevant Orders   EKG 12-Lead (Completed)      Grace SAVARYwas last seen on July 19, 2018 via telemedicine.  Initial visit was in February 2020 for dyspnea as well as off-and-on chest pressure/pain with coronary calcification on CT scan.  She was also noted to have diagnosis of COPD Gold 3. -> Prior to this visit, BP meds converted from ACE inhibitor to losartan 100 g along with metoprolol 25 mg twice daily and amlodipine. Apparently amlodipine was discontinued follow-up  today because of cramping and metoprolol was increased to 50 mg twice daily. -> Major issue at this time was blood pressure control. Not having any chest discomfort. Was able to mow the lawn over the course of 2 days. More troubled by breathing issues exacerbated by seasonal allergies. Cramping improved off amlodipine, blood pressures are higher.  Converted losartan to p.m. dosing and changes metoprolol to full 50 mg twice daily.  Plan was to check CT coronary angiogram  -> today because of COVID-19.  Recent Hospitalizations:   Outpatient EGD/colonoscopy-02/15/2020: Esophageal mucosal changes suspicious for short segment Barrett's  esophagus.  Nodularity and ulceration noted in the distal most margin--biopsied.  Acquired duodenal stenosis.  Perianal hemorrhoids noted.  A few medium mouth diverticula noted in the sigmoid colon.  8 mm sessile polyp found to the ileocecal valve- removed with hot snare.  5 mm sessile polyp in the ascending colon resected and retrieved with cold snare.  Planned bronchoscopy (Dr. Valeta Harms)  Reviewed  CV studies:    The following studies were reviewed today: (if available, images/films reviewed: From Epic Chart or Care Everywhere) . 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 Asymmetric hypermetabolic activity in the inferior Left Hilum is concerning for a metastatic hilar lymph node. No mediastinal or supraclavicular adenopathy. No metabolic activity associated with small L Lobe pulmonary nodule - favors benign. Focal asymmetric activity assoc w/ L adrenal gland similar to liver activity & favors benign.  . Screening CT of the Chest 02/20/2020: Lung RADS 4 BS-suspicious 3.9 cm left lower lobe mass-in absence of symptoms of infection is highly worrisome for primary bronchogenic carcinoma. Separate nodule within left lower lobe also identified with nuclear diameter 7.2 mm. Multivessel coronary calcification. Emphysema and aortic atherosclerosis noted.  PFTs in 2020 which revealed a ratio of 46 and an FEV1 of 0.87 L, 35% predicted with a 23% bronchodilator response, evidence of air trapping with an RV of 185% and a DLCO of 73% == Severe COPD,    Interval History:   Grace [name] presents now for extremely delayed follow-up having not yet had her coronary CTA done.  She has certainly had lots of other issues going on, and never got around to getting the study performed.  She has baseline exertional dyspnea, but denies any chest tightness or pressure.  Blood pressures have been a little better controlled than they are here today at home.  She denies any  headache or blurred vision.  No PND orthopnea or edema. She does not use CPAP because of claustrophobia and has not yet tried nasal prongs. She denies any significant prolonged palpitations.  Occasionally feels lightheaded, but no syncope or near syncope.  Most of which she notes is just poor energy.  She notes off-and-on sharp pain in the chest worse with deep inspiration.  She has frequent coughing spells consistent with bronchitis.  CV Review of Symptoms (Summary): positive for - dyspnea on exertion, palpitations and shortness of breath negative for - chest pain, orthopnea, paroxysmal nocturnal dyspnea, rapid heart rate or Syncope/near syncope or TIA/amaurosis fugax, claudication  She has been referred to [place] for Barrett's esophagus.  Is currently undergoing evaluation for lung cancer with Dr. Valeta Harms.   The patient does not have symptoms concerning for COVID-19 infection (fever, chills, cough, or new shortness of breath).   REVIEWED OF SYSTEMS   Review of Systems  Constitutional: Negative for fever, malaise/fatigue (She does not have a lot of energy, but not  fatigued) and weight loss.  HENT: Negative for congestion and nosebleeds.   Respiratory: Positive for cough, shortness of breath and wheezing. Negative for hemoptysis and sputum production.   Cardiovascular: Negative for leg swelling.  Gastrointestinal: Positive for blood in stool (Sometimes with wiping), constipation and heartburn. Negative for abdominal pain, diarrhea and melena.  Genitourinary: Negative for hematuria.  Musculoskeletal: Positive for joint pain. Negative for falls.  Neurological: Negative for dizziness and focal weakness.  Psychiatric/Behavioral: Negative for memory loss. The patient is nervous/anxious. The patient does not have insomnia.    I have reviewed and (if needed) personally updated the patient's problem list, medications, allergies, past medical and surgical history, social and family history.    PAST MEDICAL HISTORY   Past Medical History:  Diagnosis Date  . Abnormal vaginal bleeding   . Anxiety   . Asthma   . Barrett's esophagus    with high grade dysplasia per endoscopy 05/2005 // followed by Dr. [name] ([facility])  . Chronic back pain   . COPD (chronic obstructive pulmonary disease) ([place])   . Diabetes mellitus without complication ([place])    type 2  . Duodenal ulcer disease    thought to be contributed at least partly by overuse of NSAIDs and salicylates (goody powdr)  . Endometrial polyp 03/2003   s/p resection in 03/2003. Path showing submucosal leiomyoma and benign  proliferative type endometrium  . Family history of premature CAD 05/19/2018  . Fibroid uterus    s/p myomectomy x 2  . GERD (gastroesophageal reflux disease)   . H/O failed moderate sedation   . Hiatal hernia   . History of CVA (cerebrovascular accident) 10/2000  . 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 Hyperlipidemia   . Hypertension   . Hypothyroidism    with hx of multinodular goiter (noted on neck US in 11/2002 - showing diffuse nodularity and inhomogenous texture diffusely BL)  . Internal hemorrhoids    grade 2 per colonoscopy in 09/2006 - repeat colonoscopy rec in 5-10 years.  . Migraine   . Nevus   . Personal history of failed conscious sedation 08/11/2011  . Sleep apnea    does not use cpap  . Stroke ([place]) 10/2000  . Tachycardia   . Thyroid disease   . Tobacco abuse Jan 2014   quit    PAST SURGICAL HISTORY   Past Surgical History:  Procedure Laterality Date  . APPENDECTOMY  2017  . BIOPSY  02/15/2020   Procedure: BIOPSY;  Surgeon: Arta Silence, MD;  Location: [facility];  Service: Endoscopy;;  . BRONCHIAL BIOPSY  03/15/2020   Procedure: BRONCHIAL BIOPSIES;  Surgeon: Garner Nash, DO;  Location: [facility];  Service: Pulmonary;;  . BRONCHIAL BRUSHINGS  03/15/2020   Procedure:  BRONCHIAL BRUSHINGS;  Surgeon: Garner Nash, DO;  Location: [facility];  Service: Pulmonary;;  . BRONCHIAL NEEDLE ASPIRATION BIOPSY  03/15/2020   Procedure: BRONCHIAL NEEDLE ASPIRATION BIOPSIES;  Surgeon: Garner Nash, DO;  Location: [facility];  Service: Pulmonary;;  . BRONCHIAL WASHINGS  03/15/2020   Procedure: BRONCHIAL WASHINGS;  Surgeon: Garner Nash, DO;  Location: [facility];  Service: Pulmonary;;  . CHOLECYSTECTOMY    . COLONOSCOPY  02/07/2010  . COLONOSCOPY WITH PROPOFOL N/A 02/15/2020   Procedure: COLONOSCOPY WITH PROPOFOL;  Surgeon: Arta Silence, MD;  Location: [facility];  Service: Endoscopy;  Laterality: N/A;  .  DILATION AND CURETTAGE OF UTERUS    . ESOPHAGOGASTRODUODENOSCOPY (EGD) WITH PROPOFOL N/A 02/15/2020   Procedure: ESOPHAGOGASTRODUODENOSCOPY (EGD) WITH PROPOFOL;  Surgeon: Arta Silence, MD;  Location: [facility];  Service: Endoscopy;  Laterality: N/A;  . HYSTEROSCOPY    . LAPAROSCOPIC APPENDECTOMY N/A 09/23/2015   Procedure: APPENDECTOMY LAPAROSCOPIC;  Surgeon: Georganna Skeans, MD;  Location: [facility];  Service: General;  Laterality: N/A;  . [place]   x 2 - In 1997, noted to have extensive pelvic adhesions and BL tubal obstruction  . POLYPECTOMY  02/15/2020   Procedure: POLYPECTOMY;  Surgeon: Arta Silence, MD;  Location: [facility];  Service: Endoscopy;;  . TRANSTHORACIC ECHOCARDIOGRAM  06/2014    EF 55-60%.  No RWMA. Gr 1 DD. Cannot R/o PFO (consider bubble study).  Marland Kitchen UPPER GASTROINTESTINAL ENDOSCOPY  12/18/2009  . VIDEO BRONCHOSCOPY WITH ENDOBRONCHIAL NAVIGATION N/A 03/15/2020   Procedure: VIDEO BRONCHOSCOPY WITH ENDOBRONCHIAL NAVIGATION;  Surgeon: Garner Nash, DO;  Location: [facility];  Service: Pulmonary;  Laterality: N/A;  . VIDEO BRONCHOSCOPY WITH ENDOBRONCHIAL ULTRASOUND  03/15/2020   Procedure: VIDEO BRONCHOSCOPY WITH ENDOBRONCHIAL ULTRASOUND;  Surgeon: Garner Nash, DO;  Location: [place];  Service: Pulmonary;;     Immunization History  Administered Date(s) Administered  . Influenza Split 04/25/2011  . Influenza Whole 02/06/2009  . Influenza, Seasonal, Injecte, Preservative Fre 05/20/2012  . Influenza,inj,Quad PF,6+ Mos 03/15/2013, 01/12/2015, 01/25/2016, 12/23/2016, 01/22/2018, 01/26/2019  . Influenza-Unspecified 12/13/2013, 02/21/2020  . PFIZER SARS-COV-2 Vaccination 07/07/2019, 07/28/2019, 02/21/2020  . Pneumococcal Polysaccharide-23 05/20/2012  . Tdap 06/12/2011  . Zoster 12/16/2015    MEDICATIONS/ALLERGIES   Current Meds  Medication Sig  . albuterol (PROAIR HFA) 108 (90 Base) MCG/ACT inhaler INHALE 2 PUFFS EVERY 6  HOURS AS NEEDED FOR  SHORTNESS OF BREATH OR  WHEEZING (Patient taking differently: Inhale 2 puffs into the lungs every 6 (six) hours as needed for wheezing. )  . atorvastatin (LIPITOR) 80 MG tablet TAKE 1 TABLET BY MOUTH ONCE DAILY AT BEDTIME FOR  CHOLESTEROL (Patient taking differently: Take 80 mg by mouth at bedtime. )  . Biotin 5000 MCG TABS Take 5,000 mcg by mouth in the morning and at bedtime.   . bisoprolol (ZEBETA) 5 MG tablet One twice daily (Patient taking differently: Take 5 mg by mouth in the morning and at bedtime. )  . estradiol (ESTRACE VAGINAL) 0.1 MG/GM vaginal cream Place 1 Applicatorful vaginally at bedtime. (Patient taking differently: Place 1 Applicatorful vaginally 2 (two) times a week. )  . fluticasone-salmeterol (ADVAIR HFA) 230-21 MCG/ACT inhaler Inhale 2 puffs into the lungs 2 (two) times daily.  Marland Kitchen glipiZIDE (GLUCOTROL XL) 5 MG 24 hr tablet TAKE 1 TABLET BY MOUTH  DAILY WITH BREAKFAST (Patient taking differently: Take 5 mg by mouth every evening. )  . glucose blood (ONETOUCH VERIO) test strip Use to check blood sugar twice daily. Dx: E11.9  . hydrALAZINE (APRESOLINE) 50 MG tablet TAKE 1 TABLET BY MOUTH 3  TIMES DAILY (Patient taking differently: Take 50 mg by mouth 3 (three) times daily. )  . ipratropium-albuterol (DUONEB) 0.5-2.5 (3) MG/3ML SOLN Take 3  mLs by nebulization every 6 (six) hours as needed. (Patient taking differently: Take 3 mLs by nebulization every 6 (six) hours as needed (wheezing/shortness of breath.). )  . Lancets (ONETOUCH DELICA PLUS EQASTM19Q) MISC USE TO CHECK BLOOD SUGAR  TWICE DAILY  . levothyroxine (SYNTHROID) 137 MCG tablet TAKE 1 TABLET BY MOUTH  DAILY BEFORE BREAKFAST (Patient taking differently: Take 137 mcg by mouth daily  before breakfast. )  . losartan (COZAAR) 100 MG tablet TAKE 1 TABLET BY MOUTH  DAILY (Patient taking differently: Take 100 mg by mouth every evening. )  . omeprazole (PRILOSEC) 20 MG capsule Take 30- 60 min before your first and last meals of the day (Patient taking differently: Take 20 mg by mouth 2 (two) times daily before a meal. Take 30- 60 min before your first and last meals of the day)  . Respiratory Therapy Supplies (NEBULIZER/TUBING/MOUTHPIECE) KIT 1 application by Does not apply route every 6 (six) hours as needed. FAX TO [id number]  . Tiotropium Bromide Monohydrate (SPIRIVA RESPIMAT) 2.5 MCG/ACT AERS USE 2 INHALATIONS BY MOUTH  ONCE DAILY (Patient taking differently: Inhale 2 puffs into the lungs daily. )  . valACYclovir (VALTREX) 500 MG tablet TAKE 1 TABLET BY MOUTH  DAILY (Patient taking differently: Take 500 mg by mouth daily. )    Allergies  Allergen Reactions  . Amlodipine     Cramps   . Anoro Ellipta [Umeclidinium-Vilanterol] Other (See Comments)    Chest pain if used more than 3 consecutive days   . Codeine     REACTION: headaches  . Gabapentin     headache  . Prozac [Fluoxetine Hcl] Other (See Comments)    Suicidal ideations    SOCIAL HISTORY/FAMILY HISTORY   Reviewed in Epic:  Pertinent findings: No new changes  OBJCTIVE -PE, EKG, labs   Wt Readings from Last 3 Encounters:  03/15/20 185 lb (83.9 kg)  03/12/20 185 lb (83.9 kg)  03/01/20 182 lb (82.6 kg)    Physical Exam: BP (!) 145/82   Pulse 68   Temp (!) 96.8 F (36 C)   Ht 5' 3" (1.6 m)   Wt 185 lb  (83.9 kg)   LMP 02/27/2014   SpO2 96%   BMI 32.77 kg/m  Physical Exam Vitals reviewed.  Constitutional:      General: She is not in acute distress.    Appearance: Normal appearance. She is obese. She is ill-appearing (Chronically ill-appearing). She is not toxic-appearing.  HENT:     Head: Normocephalic and atraumatic.  Neck:     Vascular: No carotid bruit, hepatojugular reflux or JVD.  Cardiovascular:     Rate and Rhythm: Normal rate and regular rhythm. Occasional extrasystoles are present.    Chest Wall: PMI is not displaced.     Pulses: Decreased pulses (Mildly decreased pedal pulses-1+).     Heart sounds: S1 normal and S2 normal. Heart sounds are distant. No murmur heard. No gallop.   Pulmonary:     Comments: Patient accessory muscle use.  Nonlabored.  Diffuse interstitial sounds.  Late expiratory wheeze. Chest:     Chest wall: Tenderness (Parasternal tenderness to palpation) present.  Musculoskeletal:        General: No swelling. Normal range of motion.     Cervical back: Normal range of motion and neck supple.  Neurological:     General: No focal deficit present.     Mental Status: She is alert and oriented to person, place, and time.  Psychiatric:        Mood and Affect: Mood normal.        Behavior: Behavior normal.        Thought Content: Thought content normal.        Judgment: Judgment normal.     Adult ECG Report  Rate: 68 ;  Rhythm: normal sinus rhythm, premature ventricular contractions (PVC) and LVH.  Otherwise normal axis, intervals durations.;  Narrative Interpretation: PVCs noted.  Recent Labs: Reviewed Lab Results  Component Value Date   CHOL 118 09/28/2019   HDL 45 (L) 09/28/2019   LDLCALC 56 09/28/2019   LDLDIRECT 104 (H) 06/12/2010   TRIG 90 09/28/2019   CHOLHDL 2.6 09/28/2019   Lab Results  Component Value Date   CREATININE 0.68 03/15/2020   BUN 11 03/15/2020   NA 142 03/15/2020   K 3.1 (L) 03/15/2020   CL 103 03/15/2020   CO2 27  03/15/2020   Lab Results  Component Value Date   TSH 0.54 09/28/2019    ASSESSMENT/PLAN    Problem List Items Addressed This Visit    Coronary artery calcification seen on CAT scan - Primary (Chronic)    For now, since she has lots of other things going forward, we will hold off on any further CAD evaluation.  However if she is indeed potentially in need of lung surgery, I think I would then proceed with coronary CTA.  This would give [place] a coronary calcium score and extent of CAD.  She is not having anginal symptoms, therefore at this point ischemic evaluation as not associate warranted.  She wants to try to get the answer about the lung cancer question done.  I do think though that she will need ischemic evaluation prior to any surgeries.  Best option would be coronary CTA-CT FFR for anatomic and ischemic evaluation..      Hyperlipidemia LDL goal <100 (Chronic)    She is now back on atorvastatin 80 mg.  Last labs showed LDL of 56.  This is well within goal.  With other risk factors, is not unreasonable to try to shoot for aggressive control with LDL less than 70.  She does have coronary calcification which has not yet been fully evaluated.        Essential hypertension (Chronic)    Borderline pressures today on losartan, hydralazine and bisoprolol.  She says at home her blood pressures are better controlled than they are here today.  Low threshold to increase hydralazine to 100 mg 3 times daily if pressures do not adequately control.  Losartan is at max dose, and limited beta-blocker titration because of COPD and resting heart rate 60 bpm.      Relevant Orders   EKG 12-Lead (Completed)   Pre-op evaluation    No acute evaluation needed for bronchoscopy, however ending results of bronchoscopy, if lung surgery is required, we would probably need to do some type of evaluation.  She is limited by her dyspnea and therefore difficult to assess true functional status.  With known coronary  calcification, I think coronary CTA-FFR would be the best option for ischemic evaluation; will probably also likely check 2D echocardiogram to get baseline cardiac function perioperatively.  She would like to wait to have any of the studies done until we are sure what is needed.      Relevant Orders   EKG 12-Lead (Completed)    Other Visit Diagnoses    Family history of premature CAD       Relevant Orders   EKG 12-Lead (Completed)       COVID-19 Education: The signs and symptoms of COVID-19 were discussed with the patient and how to seek care for testing (follow up with PCP or arrange E-visit).   The importance of social distancing and COVID-19 vaccination was discussed today. 1 min The patient is practicing social distancing & Masking.   I spent a total of with the patient  spent in direct patient consultation.  Additional time spent with chart review  / charting (studies, outside notes, etc): 15 -> GI and pulmonary medicine clinic visits and procedures reviewed. Total Time: 42 min   Current medicines are reviewed at length with the patient today.  (+/- concerns) n/a  This visit occurred during the SARS-CoV-2 public health emergency.  Safety protocols were in place, including screening questions prior to the visit, additional usage of staff PPE, and extensive cleaning of exam room while observing appropriate contact time as indicated for disinfecting solutions.  Notice: This dictation was prepared with Dragon dictation along with smaller phrase technology. Any transcriptional errors that result from this process are unintentional and may not be corrected upon review.  Patient Instructions / Medication Changes & Studies & Tests Ordered   Patient Instructions  Medication Instructions:  No changes  *If you need a refill on your cardiac medications before your next appointment, please call your pharmacy*   Lab Work:  Not needed   Testing/Procedures: Not  Needed at  present if surgery is schedule you will need to have a Coronary CTA completed prior to lung surgery.   Follow-Up: At [facility], you and your health needs are our priority.  As part of our continuing mission to provide you with exceptional heart care, we have created designated Provider Care Teams.  These Care Teams include your primary Cardiologist (physician) and Advanced Practice Providers (APPs -  Physician Assistants and Nurse Practitioners) who all work together to provide you with the care you need, when you need it.     Your next appointment:   6 month(s)  The format for your next appointment:   In Person  Provider:   Glenetta Hew, MD   Other Instructions    Studies Ordered:   Orders Placed This Encounter  Procedures  . EKG 12-Lead     Glenetta Hew, M.D., M.S. Interventional Cardiologist   Pager # [phone] Phone # [phone] [address]. [place], [place] [address]   Thank you for choosing Heartcare at [facility]!!

## 2020-03-12 NOTE — Patient Instructions (Addendum)
Medication Instructions:  No changes  *If you need a refill on your cardiac medications before your next appointment, please call your pharmacy*   Lab Work:  Not needed   Testing/Procedures: Not  Needed at present if surgery is schedule you will need to have a Coronary CTA completed prior to lung surgery.   Follow-Up: At [facility], you and your health needs are our priority.  As part of our continuing mission to provide you with exceptional heart care, we have created designated Provider Care Teams.  These Care Teams include your primary Cardiologist (physician) and Advanced Practice Providers (APPs -  Physician Assistants and Nurse Practitioners) who all work together to provide you with the care you need, when you need it.     Your next appointment:   6 month(s)  The format for your next appointment:   In Person  Provider:   Glenetta Hew, MD   Other Instructions

## 2020-03-13 ENCOUNTER — Encounter (HOSPITAL_COMMUNITY): Payer: Self-pay | Admitting: Pulmonary Disease

## 2020-03-13 NOTE — Progress Notes (Signed)
Patient denies shortness of breath, fever, cough or chest pain.  PCP - Dr [email or web address] Cardiologist - Dr. Rosana Hoes Pulmonology - Dr Glenetta Hew  Chest x-ray - n/a, CT Chest 03/12/20 EKG - 03/12/20 Stress Test - 03/22/04 ECHO - 07/04/14 Cardiac Cath - n/a  Sleep apnea - Yes - does not use cpap  Fasting Blood Sugar - 100s Checks Blood Sugar occasional checks  Do not take glipizide Wed night or Thurs morning on day of surgery.  . If your blood sugar is less than 70 mg/dL, you will need to treat for low blood sugar: o Treat a low blood sugar (less than 70 mg/dL) with  cup of clear juice (cranberry or apple), 4 glucose tablets, OR glucose gel. o Recheck blood sugar in 15 minutes after treatment (to make sure it is greater than 70 mg/dL). If your blood sugar is not greater than 70 mg/dL on recheck, call [phone] for further instructions.  Anesthesia review: Yes  STOP now taking any Aspirin (unless otherwise instructed by your surgeon), Aleve, Naproxen, Ibuprofen, Motrin, Advil, Goody's, BC's, all herbal medications, fish oil, and all vitamins.   Coronavirus Screening Covid test on 03/12/20 was negative.  Patient verbalized understanding of instructions that were given via phone.

## 2020-03-14 ENCOUNTER — Encounter (HOSPITAL_COMMUNITY): Payer: Self-pay | Admitting: Pulmonary Disease

## 2020-03-14 ENCOUNTER — Other Ambulatory Visit: Payer: Self-pay

## 2020-03-14 NOTE — Progress Notes (Signed)
Anesthesia Chart Review: SAME DAY WORK-UP   Case: [id number] Date/Time: 03/15/20 0730   Procedure: VIDEO BRONCHOSCOPY WITH ENDOBRONCHIAL NAVIGATIO (N/A )   Anesthesia type: General   Pre-op diagnosis: lung mass   Location: [facility] ROOM 2 / [place] [facility]   Surgeons: Garner Nash, DO      DISCUSSION: Patient is a 65 year old female scheduled for the above procedure.  History includes former smoker (quit 05/10/15), COPD (Stage 3 severe), asthma, LLL lung mass, OSA (does not use CPAP), HTN, HLD, DM2, CVA (2002), hypothyroidism, GERD, hiatal hernia, duodenal ulcer, pineal cyst, fibroids (s/p myomectomy x2), tachycardia, family history of premature CAD, migraines, chronic back pain. Reported history of failed moderate sedation.  S/p EGD (suspicous for Barrett's esophagus with biopsy showing high grade dysplasia focally worrisome for superficial invasion; nodularity and ulceration in distal-most margin, acquired duodenal stenosis) and colonoscopy (hemorrhoids, sigmoid diverticulosis, polyps resection x3) by Barkley Boards, MD on 02/15/20.  She was evaluated by cardiologist Dr. Ellyn Hack last on 03/12/20. Currently, the note in not finalized but per After Visit Summary, no cardiac testing needed at present, but should she end up needing lung surgery, then he would like her to have a coronary CTA.    Preoperative COVID-19 test negative on 03/04/2020.  Anesthesia team to evaluate on the day of procedure.   VS: Ht 5' 3"  (1.6 m)   Wt 83.9 kg   LMP 02/27/2014   BMI 32.77 kg/m  BP Readings from Last 3 Encounters:  03/12/20 (!) 145/82  03/01/20 140/80  02/20/20 140/80   Pulse Readings from Last 3 Encounters:  03/12/20 68  03/01/20 80  02/20/20 (!) 104    PROVIDERS: Andree Moro, DO is PCP [facility]) Glenetta Hew, MD is cardiologist Rayann Heman, MD is pulmonologist. Also seen by June Leap, DO on 03/01/20 to discuss plans for navigational bronchoscopy pending PET scan and super D Chest  CT results. Due to her severe lung disease with FEV1 of less than 1 L, he felt she would not likely tolerate lobectomy. Continue [place] for COPD.    LABS: For day of procedure. Currently, last available lab results include: Lab Results  Component Value Date   WBC 8.8 09/28/2019   HGB 11.8 09/28/2019   HCT 37.7 09/28/2019   PLT 277 09/28/2019   GLUCOSE 98 09/28/2019   ALT 19 09/28/2019   AST 19 09/28/2019   NA 141 09/28/2019   K 4.4 09/28/2019   CL 106 09/28/2019   CREATININE 0.76 09/28/2019   BUN 14 09/28/2019   CO2 25 09/28/2019   TSH 0.54 09/28/2019   HGBA1C 6.0 (H) 09/28/2019    Sleep Study 01/04/19: IMPRESSION: 1, Moderate to severe obstructive sleep apnea. 2. Moderate oxyhemoglobin desaturation with an oxygen nadir of 76% and oxygen saturation less than 88% for 19.6 minutes.  PFTs 04/26/18: "ratio of 46 and an FEV1 of 0.87 L, 35% predicted with a 23% bronchodilator response, evidence of air trapping with an RV of 185% and a DLCO of 73%."   IMAGES: CT Super D Chest 03/12/20: IMPRESSION: 1. LEFT lower lobe mass with no change in size, over the short interval but with resolution of surrounding, septal thickening and ground-glass, perhaps resolution of postobstructive process. Masslike area remains concerning for bronchogenic neoplasm. 2. Interval decrease in size of small associated LEFT lower lobe nodule felt to represent resolving inflammation. 3. Nodular LEFT adrenal gland shows low attenuation, likely related to adrenal adenoma, consider attention on follow-up imaging. 4. Three-vessel coronary artery  calcification. 5. Aortic atherosclerosis.  PET Scan 03/02/20: IMPRESSION: 1. Hypermetabolic mass in the LEFT lower lobe consistent primary bronchogenic carcinoma. 2. Asymmetric hypermetabolic activity in the inferior LEFT hilum is concerning for a metastatic hilar lymph node. No mediastinal adenopathy. No supraclavicular adenopathy. 3. No metabolic  activity associated with small LEFT lobe pulmonary nodule favors benign nodule. Recommend attention on follow-up. 4. Focal asymmetric activity associated with the LEFT adrenal gland similar to liver activity and favored benign activity. No lesion on CT portion of exam. Recommend attention on follow-up.   EKG: 03/12/20 ([facility]): Sinus rhythm with occasional premature ventricular complexes   CV: Echo 07/04/14: Study Conclusions  - Left ventricle: The cavity size was normal. Wall thickness was  normal. Systolic function was normal. The estimated ejection  fraction was in the range of 55% to 60%. Wall motion was normal;  there were no regional wall motion abnormalities. Doppler  parameters are consistent with abnormal left ventricular  relaxation (grade 1 diastolic dysfunction).  - Mitral valve: Calcified annulus.   Impressions:  - Normal LV function; grade 1 diastolic dysfunction; cannot R/O  PFO; suggest microcavitation study if clinically indicated.     Past Medical History:  Diagnosis Date  . Abnormal vaginal bleeding   . Anxiety   . Asthma   . Barrett's esophagus    with high grade dysplasia per endoscopy 05/2005 // followed by Dr. [name] ([facility])  . Chronic back pain   . COPD (chronic obstructive pulmonary disease) ([place])   . Diabetes mellitus without complication ([place])    type 2  . Duodenal ulcer disease    thought to be contributed at least partly by overuse of NSAIDs and salicylates (goody powdr)  . Endometrial polyp 03/2003   s/p resection in 03/2003. Path showing submucosal leiomyoma and benign  proliferative type endometrium  . Family history of premature CAD 05/19/2018  . Fibroid uterus    s/p myomectomy x 2  . GERD (gastroesophageal reflux disease)   . H/O failed moderate sedation   . Hiatal hernia   . History of CVA (cerebrovascular accident) 10/2000  . History of pineal cyst    11 mm cystic mass noted in the pineal gland per MRI in  2001 - most consitent with simple pineal cyst  . Hyperlipidemia   . Hypertension   . Hypothyroidism    with hx of multinodular goiter (noted on neck US in 11/2002 - showing diffuse nodularity and inhomogenous texture diffusely BL)  . Internal hemorrhoids    grade 2 per colonoscopy in 09/2006 - repeat colonoscopy rec in 5-10 years.  . Migraine   . Nevus   . Personal history of failed conscious sedation 08/11/2011  . Sleep apnea    does not use cpap  . Stroke ([place]) 10/2000  . Tachycardia   . Thyroid disease   . Tobacco abuse Jan 2014   quit    Past Surgical History:  Procedure Laterality Date  . APPENDECTOMY  2017  . BIOPSY  02/15/2020   Procedure: BIOPSY;  Surgeon: Arta Silence, MD;  Location: [facility];  Service: Endoscopy;;  . CHOLECYSTECTOMY    . COLONOSCOPY  02/07/2010  . COLONOSCOPY WITH PROPOFOL N/A 02/15/2020   Procedure: COLONOSCOPY WITH PROPOFOL;  Surgeon: Arta Silence, MD;  Location: [facility];  Service: Endoscopy;  Laterality: N/A;  . DILATION AND CURETTAGE OF UTERUS    . ESOPHAGOGASTRODUODENOSCOPY (EGD) WITH PROPOFOL N/A 02/15/2020   Procedure: ESOPHAGOGASTRODUODENOSCOPY (EGD) WITH PROPOFOL;  Surgeon: Arta Silence, MD;  Location:  [facility];  Service: Endoscopy;  Laterality: N/A;  . HYSTEROSCOPY    . LAPAROSCOPIC APPENDECTOMY N/A 09/23/2015   Procedure: APPENDECTOMY LAPAROSCOPIC;  Surgeon: Georganna Skeans, MD;  Location: [place];  Service: General;  Laterality: N/A;  . [place]   x 2 - In 1997, noted to have extensive pelvic adhesions and BL tubal obstruction  . POLYPECTOMY  02/15/2020   Procedure: POLYPECTOMY;  Surgeon: Arta Silence, MD;  Location: [facility];  Service: Endoscopy;;  . TRANSTHORACIC ECHOCARDIOGRAM  06/2014    EF 55-60%.  No RWMA. Gr 1 DD. Cannot R/o PFO (consider bubble study).  Marland Kitchen UPPER GASTROINTESTINAL ENDOSCOPY  12/18/2009    MEDICATIONS: . albuterol (VENTOLIN HFA) 108 (90 Base) MCG/ACT inhaler 2 puff   . albuterol  (PROAIR HFA) 108 (90 Base) MCG/ACT inhaler  . atorvastatin (LIPITOR) 80 MG tablet  . bisoprolol (ZEBETA) 5 MG tablet  . estradiol (ESTRACE VAGINAL) 0.1 MG/GM vaginal cream  . fluticasone-salmeterol (ADVAIR HFA) 230-21 MCG/ACT inhaler  . glipiZIDE (GLUCOTROL XL) 5 MG 24 hr tablet  . hydrALAZINE (APRESOLINE) 50 MG tablet  . ipratropium-albuterol (DUONEB) 0.5-2.5 (3) MG/3ML SOLN  . levothyroxine (SYNTHROID) 137 MCG tablet  . LORazepam (ATIVAN) 0.5 MG tablet  . losartan (COZAAR) 100 MG tablet  . omeprazole (PRILOSEC) 20 MG capsule  . Tiotropium Bromide Monohydrate (SPIRIVA RESPIMAT) 2.5 MCG/ACT AERS  . valACYclovir (VALTREX) 500 MG tablet  . Biotin 5000 MCG TABS  . glucose blood (ONETOUCH VERIO) test strip  . Lancets (ONETOUCH DELICA PLUS [id number]) MISC  . Respiratory Therapy Supplies (NEBULIZER/TUBING/MOUTHPIECE) KIT    Myra Gianotti, PA-C Surgical Short Stay/Anesthesiology [facility] Phone [phone] [facility] Phone [phone] 03/14/2020 12:00 PM

## 2020-03-14 NOTE — Anesthesia Preprocedure Evaluation (Addendum)
Anesthesia Evaluation  Patient identified by MRN, date of birth, ID band Patient awake    Reviewed: Allergy & Precautions, NPO status , Patient's Chart, lab work & pertinent test results, reviewed documented beta blocker date and time   History of Anesthesia Complications Negative for: history of anesthetic complications  Airway Mallampati: II  TM Distance: >3 FB Neck ROM: Full    Dental  (+) Poor Dentition, Missing, Chipped, Dental Advisory Given   Pulmonary asthma , sleep apnea (does not use CPAP) , COPD,  COPD inhaler, former smoker,  03/12/2020 SARS coronavirus NEG   breath sounds clear to auscultation       Cardiovascular hypertension, Pt. on medications and Pt. on home beta blockers  Rhythm:Regular Rate:Normal  '16 ECHO: Normal LV function; grade 1 diastolic dysfunction; cannot R/O PFO    Neuro/Psych  Headaches, PSYCHIATRIC DISORDERS Anxiety H/o pineal cyst CVA, No Residual Symptoms    GI/Hepatic Neg liver ROS, hiatal hernia, PUD, GERD  Medicated and Poorly Controlled,  Endo/Other  diabetes, Type 2, Oral Hypoglycemic AgentsHypothyroidism  Obesity   Renal/GU negative Renal ROS     Musculoskeletal negative musculoskeletal ROS (+)   Abdominal (+) + obese,   Peds  Hematology negative hematology ROS (+)   Anesthesia Other Findings Covid test negative See PAT note  Reproductive/Obstetrics                          Anesthesia Physical Anesthesia Plan  ASA: III  Anesthesia Plan: General   Post-op Pain Management:    Induction: Intravenous  PONV Risk Score and Plan: 3 and Treatment may vary due to age or medical condition, Ondansetron and Dexamethasone  Airway Management Planned: Oral ETT  Additional Equipment: None  Intra-op Plan:   Post-operative Plan: Extubation in OR  Informed Consent: I have reviewed the patients History and Physical, chart, labs and discussed the procedure  including the risks, benefits and alternatives for the proposed anesthesia with the patient or authorized representative who has indicated his/her understanding and acceptance.     Dental advisory given  Plan Discussed with: CRNA, Anesthesiologist and Surgeon  Anesthesia Plan Comments:       Anesthesia Quick Evaluation

## 2020-03-15 ENCOUNTER — Ambulatory Visit (HOSPITAL_COMMUNITY): Payer: Medicare Other | Admitting: Vascular Surgery

## 2020-03-15 ENCOUNTER — Ambulatory Visit (HOSPITAL_COMMUNITY): Payer: Medicare Other

## 2020-03-15 ENCOUNTER — Encounter (HOSPITAL_COMMUNITY): Payer: Self-pay | Admitting: Pulmonary Disease

## 2020-03-15 ENCOUNTER — Ambulatory Visit (HOSPITAL_COMMUNITY)
Admission: RE | Admit: 2020-03-15 | Discharge: 2020-03-15 | Disposition: A | Discharge status: Home / Self Care | Payer: Medicare Other | Attending: Pulmonary Disease | Admitting: Pulmonary Disease

## 2020-03-15 ENCOUNTER — Encounter (HOSPITAL_COMMUNITY)
Admission: RE | Disposition: A | Discharge status: Home / Self Care | Payer: Self-pay | Source: Home / Self Care | Attending: Pulmonary Disease

## 2020-03-15 DIAGNOSIS — Z79890 Hormone replacement therapy (postmenopausal): Secondary | ICD-10-CM | POA: Diagnosis not present

## 2020-03-15 DIAGNOSIS — Z9889 Other specified postprocedural states: Secondary | ICD-10-CM

## 2020-03-15 DIAGNOSIS — R918 Other nonspecific abnormal finding of lung field: Secondary | ICD-10-CM

## 2020-03-15 DIAGNOSIS — R590 Localized enlarged lymph nodes: Secondary | ICD-10-CM | POA: Diagnosis not present

## 2020-03-15 DIAGNOSIS — Z7951 Long term (current) use of inhaled steroids: Secondary | ICD-10-CM | POA: Insufficient documentation

## 2020-03-15 DIAGNOSIS — E119 Type 2 diabetes mellitus without complications: Secondary | ICD-10-CM | POA: Diagnosis not present

## 2020-03-15 DIAGNOSIS — Z79899 Other long term (current) drug therapy: Secondary | ICD-10-CM | POA: Diagnosis not present

## 2020-03-15 DIAGNOSIS — Z7984 Long term (current) use of oral hypoglycemic drugs: Secondary | ICD-10-CM | POA: Insufficient documentation

## 2020-03-15 DIAGNOSIS — J449 Chronic obstructive pulmonary disease, unspecified: Secondary | ICD-10-CM | POA: Insufficient documentation

## 2020-03-15 DIAGNOSIS — Z87891 Personal history of nicotine dependence: Secondary | ICD-10-CM | POA: Insufficient documentation

## 2020-03-15 DIAGNOSIS — Z7982 Long term (current) use of aspirin: Secondary | ICD-10-CM | POA: Insufficient documentation

## 2020-03-15 HISTORY — PX: BRONCHIAL WASHINGS: SHX5105

## 2020-03-15 HISTORY — DX: Sleep apnea, unspecified: G47.30

## 2020-03-15 HISTORY — PX: BRONCHIAL NEEDLE ASPIRATION BIOPSY: SHX5106

## 2020-03-15 HISTORY — PX: BRONCHIAL BRUSHINGS: SHX5108

## 2020-03-15 HISTORY — DX: Anxiety disorder, unspecified: F41.9

## 2020-03-15 HISTORY — PX: VIDEO BRONCHOSCOPY WITH ENDOBRONCHIAL ULTRASOUND: SHX6177

## 2020-03-15 HISTORY — PX: BRONCHIAL BIOPSY: SHX5109

## 2020-03-15 HISTORY — PX: VIDEO BRONCHOSCOPY WITH ENDOBRONCHIAL NAVIGATION: SHX6175

## 2020-03-15 LAB — BASIC METABOLIC PANEL
Anion gap: 12 (ref 5–15)
BUN: 11 mg/dL (ref 8–23)
CO2: 27 mmol/L (ref 22–32)
Calcium: 8.8 mg/dL — ABNORMAL LOW (ref 8.9–10.3)
Chloride: 103 mmol/L (ref 98–111)
Creatinine, Ser: 0.68 mg/dL (ref 0.44–1.00)
GFR, Estimated: >60 mL/min (ref >60)
Glucose, Bld: 152 mg/dL — ABNORMAL HIGH (ref 70–99)
Potassium: 3.1 mmol/L — ABNORMAL LOW (ref 3.5–5.1)
Sodium: 142 mmol/L (ref 135–145)

## 2020-03-15 LAB — GLUCOSE, CAPILLARY
Glucose-Capillary: 150 mg/dL — ABNORMAL HIGH (ref 70–99)
Glucose-Capillary: 115 mg/dL — ABNORMAL HIGH (ref 70–99)

## 2020-03-15 LAB — CBC
HCT: 39 % (ref 36.0–46.0)
Hemoglobin: 12.2 g/dL (ref 12.0–15.0)
MCH: 30.2 pg (ref 26.0–34.0)
MCHC: 31.3 g/dL (ref 30.0–36.0)
MCV: 96.5 fL (ref 80.0–100.0)
Platelets: 228 10*3/uL (ref 150–400)
RBC: 4.04 MIL/uL (ref 3.87–5.11)
RDW: 14.3 % (ref 11.5–15.5)
WBC: 6.9 10*3/uL (ref 4.0–10.5)
nRBC: 0 % (ref 0.0–0.2)

## 2020-03-15 SURGERY — VIDEO BRONCHOSCOPY WITH ENDOBRONCHIAL NAVIGATION
Anesthesia: General

## 2020-03-15 MED ORDER — PHENYLEPHRINE HCL-NACL 10-0.9 MG/250ML-% IV SOLN
INTRAVENOUS | Status: DC | PRN
  Administered 2020-03-15: 25 ug/min via INTRAVENOUS

## 2020-03-15 MED ORDER — LACTATED RINGERS IV SOLN: INTRAVENOUS | Status: DC

## 2020-03-15 MED ORDER — OXYCODONE HCL 5 MG PO TABS: 5.0000 mg | ORAL_TABLET | Freq: Once | ORAL | Status: DC | PRN

## 2020-03-15 MED ORDER — DEXAMETHASONE SODIUM PHOSPHATE 10 MG/ML IJ SOLN
INTRAMUSCULAR | Status: DC | PRN
  Administered 2020-03-15: 10 mg via INTRAVENOUS

## 2020-03-15 MED ORDER — OXYCODONE HCL 5 MG/5ML PO SOLN: 5.0000 mg | Freq: Once | ORAL | Status: DC | PRN

## 2020-03-15 MED ORDER — ROCURONIUM BROMIDE 10 MG/ML (PF) SYRINGE
PREFILLED_SYRINGE | INTRAVENOUS | Status: DC | PRN
  Administered 2020-03-15: 70 mg via INTRAVENOUS

## 2020-03-15 MED ORDER — MIDAZOLAM HCL 5 MG/5ML IJ SOLN
INTRAMUSCULAR | Status: DC | PRN
  Administered 2020-03-15: 2 mg via INTRAVENOUS

## 2020-03-15 MED ORDER — MIDAZOLAM HCL 2 MG/2ML IJ SOLN: 0.5000 mg | Freq: Once | INTRAMUSCULAR | Status: DC | PRN

## 2020-03-15 MED ORDER — CHLORHEXIDINE GLUCONATE 0.12 % MT SOLN
15.0000 mL | Freq: Once | OROMUCOSAL | Status: AC
  Filled 2020-03-15: qty 15

## 2020-03-15 MED ORDER — SUGAMMADEX SODIUM 200 MG/2ML IV SOLN
INTRAVENOUS | Status: DC | PRN
  Administered 2020-03-15: 300 mg via INTRAVENOUS
  Administered 2020-03-15: 100 mg via INTRAVENOUS

## 2020-03-15 MED ORDER — PROPOFOL 10 MG/ML IV BOLUS
INTRAVENOUS | Status: DC | PRN
  Administered 2020-03-15: 140 mg via INTRAVENOUS

## 2020-03-15 MED ORDER — MEPERIDINE HCL 100 MG/ML IJ SOLN: 6.2500 mg | INTRAMUSCULAR | Status: DC | PRN

## 2020-03-15 MED ORDER — CHLORHEXIDINE GLUCONATE 0.12 % MT SOLN
OROMUCOSAL | Status: AC
  Administered 2020-03-15: 15 mL via OROMUCOSAL
  Filled 2020-03-15: qty 15

## 2020-03-15 MED ORDER — FENTANYL CITRATE (PF) 100 MCG/2ML IJ SOLN: 25.0000 ug | INTRAMUSCULAR | Status: DC | PRN

## 2020-03-15 MED ORDER — FENTANYL CITRATE (PF) 250 MCG/5ML IJ SOLN
INTRAMUSCULAR | Status: DC | PRN
  Administered 2020-03-15: 50 ug via INTRAVENOUS

## 2020-03-15 MED ORDER — LIDOCAINE 2% (20 MG/ML) 5 ML SYRINGE
INTRAMUSCULAR | Status: DC | PRN
  Administered 2020-03-15: 60 mg via INTRAVENOUS

## 2020-03-15 MED ORDER — EPINEPHRINE 1 MG/10ML IJ SOSY
PREFILLED_SYRINGE | INTRAMUSCULAR | Status: AC
  Filled 2020-03-15: qty 10

## 2020-03-15 MED ORDER — ONDANSETRON HCL 4 MG/2ML IJ SOLN
4.0000 mg | Freq: Once | INTRAMUSCULAR | Status: AC | PRN
  Administered 2020-03-15: 4 mg via INTRAVENOUS

## 2020-03-15 MED ORDER — PROMETHAZINE HCL 25 MG/ML IJ SOLN: 6.2500 mg | INTRAMUSCULAR | Status: DC | PRN

## 2020-03-15 SURGICAL SUPPLY — 46 items

## 2020-03-15 NOTE — Anesthesia Postprocedure Evaluation (Signed)
Anesthesia Post Note  Patient: EVVA DIN  Procedure(s) Performed: VIDEO BRONCHOSCOPY WITH ENDOBRONCHIAL NAVIGATION (N/A ) VIDEO BRONCHOSCOPY WITH ENDOBRONCHIAL ULTRASOUND BRONCHIAL BRUSHINGS BRONCHIAL BIOPSIES BRONCHIAL NEEDLE ASPIRATION BIOPSIES BRONCHIAL WASHINGS     Patient location during evaluation: PACU Anesthesia Type: General Level of consciousness: awake and alert Pain management: pain level controlled Vital Signs Assessment: post-procedure vital signs reviewed and stable Respiratory status: spontaneous breathing, nonlabored ventilation and respiratory function stable Cardiovascular status: blood pressure returned to baseline and stable Postop Assessment: no apparent nausea or vomiting Anesthetic complications: no   No complications documented.  Last Vitals:  Vitals:   03/15/20 0918 03/15/20 0933  BP: (!) 144/74 120/76  Pulse:  80  Resp:  14  Temp:  36.6 C  SpO2:  99%    Last Pain:  Vitals:   03/15/20 0933  PainSc: 0-No pain                 Audry Pili

## 2020-03-15 NOTE — Transfer of Care (Signed)
Immediate Anesthesia Transfer of Care Note  Patient: Grace [name]  Procedure(s) Performed: VIDEO BRONCHOSCOPY WITH ENDOBRONCHIAL NAVIGATION (N/A ) VIDEO BRONCHOSCOPY WITH ENDOBRONCHIAL ULTRASOUND BRONCHIAL BRUSHINGS BRONCHIAL BIOPSIES BRONCHIAL NEEDLE ASPIRATION BIOPSIES BRONCHIAL WASHINGS  Patient Location: PACU  Anesthesia Type:General  Level of Consciousness: awake, alert  and oriented  Airway & Oxygen Therapy: Patient Spontanous Breathing  Post-op Assessment: Report given to RN, Post -op Vital signs reviewed and stable and Patient moving all extremities X 4  Post vital signs: Reviewed and stable  Last Vitals:  Vitals Value Taken Time  BP    Temp 36.6 C 03/15/20 0903  Pulse 84 03/15/20 0908  Resp 28 03/15/20 0908  SpO2 93 % 03/15/20 0908  Vitals shown include unvalidated device data.  Last Pain:  Vitals:   03/15/20 0656  PainSc: 10-Worst pain ever      Patients Stated Pain Goal: 2 (62/83/15 1761)  Complications: No complications documented.

## 2020-03-15 NOTE — Discharge Instructions (Signed)
Flexible Bronchoscopy, Care After This sheet gives you information about how to care for yourself after your test. Your doctor may also give you more specific instructions. If you have problems or questions, contact your doctor. Follow these instructions at home: Eating and drinking  The day after the test, go back to your normal diet. Driving  Do not drive for 24 hours if you were given a medicine to help you relax (sedative).  Do not drive or use heavy machinery while taking prescription pain medicine. General instructions   Take over-the-counter and prescription medicines only as told by your doctor.  Return to your normal activities as told. Ask what activities are safe for you.  Do not use any products that have nicotine or tobacco in them. This includes cigarettes and e-cigarettes. If you need help quitting, ask your doctor.  Keep all follow-up visits as told by your doctor. This is important. It is very important if you had a tissue sample (biopsy) taken. Get help right away if:  You have shortness of breath that gets worse.  You get light-headed.  You feel like you are going to pass out (faint).  You have chest pain.  You cough up: ? More than a little blood. ? More blood than before. Summary  Do not eat or drink anything (not even water) for 2 hours after your test, or until your numbing medicine wears off.  Do not use cigarettes. Do not use e-cigarettes.  Get help right away if you have chest pain. This information is not intended to replace advice given to you by your health care provider. Make sure you discuss any questions you have with your health care provider. Document Revised: 03/13/2017 Document Reviewed: 04/18/2016 Elsevier Patient Education  2020 [employer].

## 2020-03-15 NOTE — Op Note (Signed)
Video Bronchoscopy with Electromagnetic Navigation Procedure Note Video Bronchoscopy with Endobronchial Ultrasound Procedure Note  Date of Operation: 03/15/2020  Pre-op Diagnosis: LLL Lung mass, adenopathy   Post-op Diagnosis: LLL lung mass, adenopathy   Surgeon: Garner Nash, DO   Assistants: None   Anesthesia: General endotracheal anesthesia  Operation: Flexible video fiberoptic bronchoscopy with electromagnetic navigation and biopsies.  Estimated Blood Loss: Minimal  Complications: none  Indications and History: Grace [name] is a 64 y.o. female with left lower lobe lung mass, mediastinal adenopathy.  The risks, benefits, complications, treatment options and expected outcomes were discussed with the patient.  The possibilities of pneumothorax, pneumonia, reaction to medication, pulmonary aspiration, perforation of a viscus, bleeding, failure to diagnose a condition and creating a complication requiring transfusion or operation were discussed with the patient who freely signed the consent.    Description of Procedure: The patient was seen in the Preoperative Area, was examined and was deemed appropriate to proceed.  The patient was taken to [facility] endoscopy room 2, identified as [name] and the procedure verified as Flexible Video Fiberoptic Bronchoscopy.  A Time Out was held and the above information confirmed.   Navigation procedure: Prior to the date of the procedure a high-resolution CT scan of the chest was performed. Utilizing [place] a virtual tracheobronchial tree was generated to allow the creation of distinct navigation pathways to the patient's parenchymal abnormalities. After being taken to the operating room general anesthesia was initiated and the patient  was orally intubated. The video fiberoptic bronchoscope was introduced via the endotracheal tube and a general inspection was performed which showed normal right and left lung anatomy no evidence  of endobronchial lesion.  There was a small amount of bleeding within the anterior lateral segments of the right lower lobe but no evidence of endobronchial lesion. The extendable working channel and locator guide were introduced into the bronchoscope. The distinct navigation pathways prepared prior to this procedure were then utilized to navigate to within 0.5cm of patient's lesion(s) identified on CT scan. The extendable working channel was secured into place and the locator guide was withdrawn. Under fluoroscopic guidance transbronchial needle brushings, transbronchial Wang needle biopsies, and transbronchial forceps biopsies were performed to be sent for cytology and pathology. A bronchioalveolar lavage was performed in the LLL and sent for cytology and microbiology (bacterial, fungal, AFB smears and cultures).   Endobronchial ultrasound: The standard scope was then withdrawn and the endobronchial ultrasound was used to identify and characterize the peritracheal, hilar and bronchial lymph nodes. Inspection showed a small 50m node within the left hilum, normal appearing connective tissue and nodal structure within station 7 and normal appearing right hilum and paratracheal regions. The patient tolerated the procedure well without apparent complications.At the end of the procedure a general airway inspection was performed and there was no evidence of active bleeding.   Due to the bleeding from the RLL a BAL was sent from the RLL. 120cc of saline was instilled with minimal return. This will be sent for cell count and microbiology cultures.he patient tolerated the procedure well.  The standard bronchoscope was used for therapeutic aspiration of the bilateral mainstem's room any remaining blood clots and secretions.  There was no evidence of active bleeding.  The bronchoscope was brought to just above the main carina for observation.  There was no significant blood loss and there were no obvious complications. A  post-procedural chest x-ray is pending. The bronchoscope was withdrawn. Anesthesia was reversed and the  patient was taken to the PACU for recovery.   Samples: 1. Transbronchial needle brushings from left lower lobe 2. Transbronchial Wang needle biopsies from left lower lobe 3. Transbronchial forceps biopsies from left lower lobe 4. Bronchoalveolar lavage from lower lobe  Plans:  The patient will be discharged from the PACU to home when recovered from anesthesia and after chest x-ray is reviewed. We will review the cytology, pathology and microbiology results with the patient when they become available. Outpatient followup will be with [name] [name], DO.   All preliminary pathology in the room was negative for abnormal cells.  Garner Nash, DO [place] [facility] 03/15/2020 9:09 AM

## 2020-03-15 NOTE — Interval H&P Note (Signed)
History and Physical Interval Note:  03/15/2020 7:13 AM  Grace [name]  has presented today for surgery, with the diagnosis of lung mass.  The various methods of treatment have been discussed with the patient and family. After consideration of risks, benefits and other options for treatment, the patient has consented to  Procedure(s): [place] (N/A) as a surgical intervention.  The patient's history has been reviewed, patient examined, no change in status, stable for surgery.  I have reviewed the patient's chart and labs.  Questions were answered to the patient's satisfaction.    I met with patient in pre-op. Patient is agreeable to proceed. We discussed the risks, benefits and alternatives.   [place]

## 2020-03-15 NOTE — Anesthesia Procedure Notes (Signed)
Procedure Name: Intubation Date/Time: 03/15/2020 7:35 AM Performed by: Kyung Rudd, CRNA Pre-anesthesia Checklist: Patient identified, Emergency Drugs available, Suction available and Patient being monitored Patient Re-evaluated:Patient Re-evaluated prior to induction Oxygen Delivery Method: Circle system utilized Preoxygenation: Pre-oxygenation with 100% oxygen Induction Type: IV induction Ventilation: Mask ventilation without difficulty Laryngoscope Size: Mac and 3 Grade View: Grade I Tube type: Oral Tube size: 8.5 mm Number of attempts: 1 Airway Equipment and Method: Stylet Placement Confirmation: ETT inserted through vocal cords under direct vision,  positive ETCO2 and breath sounds checked- equal and bilateral Secured at: 22 cm Tube secured with: Tape Dental Injury: Teeth and Oropharynx as per pre-operative assessment

## 2020-03-16 LAB — CYTOLOGY - NON PAP

## 2020-03-16 LAB — ACID FAST SMEAR (AFB, MYCOBACTERIA)
Acid Fast Smear: NEGATIVE
Acid Fast Smear: NEGATIVE
Acid Fast Smear: NEGATIVE

## 2020-03-17 LAB — CULTURE, RESPIRATORY W GRAM STAIN
Culture: NO GROWTH
Culture: NORMAL
Culture: NORMAL

## 2020-03-18 ENCOUNTER — Encounter (HOSPITAL_COMMUNITY): Payer: Self-pay | Admitting: Pulmonary Disease

## 2020-03-19 LAB — ANAEROBIC CULTURE

## 2020-03-20 ENCOUNTER — Telehealth: Payer: Self-pay | Admitting: Pulmonary Disease

## 2020-03-20 DIAGNOSIS — R918 Other nonspecific abnormal finding of lung field: Secondary | ICD-10-CM

## 2020-03-20 LAB — ANAEROBIC CULTURE

## 2020-03-20 MED ORDER — AMOXICILLIN-POT CLAVULANATE 875-125 MG PO TABS: 1.0000 | ORAL_TABLET | Freq: Two times a day (BID) | ORAL | 0 refills | Status: AC

## 2020-03-20 NOTE — Telephone Encounter (Signed)
Patient is scheduled for PFT on 03/30/20 and has an OV scheduled for 04/12/20.  [facility] patient needs CT scheduled before OV please

## 2020-03-20 NOTE — Telephone Encounter (Signed)
PCCM:  I called and spoke with patient regarding bronchoscopy results.  All path results are negative for malignancy  Culture results are positive for  MODERATE PREVOTELLA MELANINOGENICA  BETA LACTAMASE POSITIVE   I started patient on augmentin for 10 days  Follow up CT imaging the first week of January.   Please schedule the patient to see me in clinic in Jan following the CT results.   Garner Nash, DO [place] [facility] 03/20/2020 10:50 AM

## 2020-03-20 NOTE — Telephone Encounter (Signed)
Can we make sure the patient has the follow up OV ? It looks like the CT has been ordered. It needs to be scheduled.

## 2020-03-22 ENCOUNTER — Other Ambulatory Visit: Payer: Self-pay | Admitting: Nurse Practitioner

## 2020-03-22 NOTE — Telephone Encounter (Signed)
LMTC x 1 for pt. Pt has ov with Dr Valeta Harms on 04/12/20 that needs to be rescheduled for after 04/19/20 when pt is having Chest Ct. Dr Valeta Harms wants to see pt after the CT to go over results.

## 2020-03-23 ENCOUNTER — Encounter: Payer: Self-pay | Admitting: Cardiology

## 2020-03-23 DIAGNOSIS — Z01818 Encounter for other preprocedural examination: Secondary | ICD-10-CM | POA: Insufficient documentation

## 2020-03-23 DIAGNOSIS — I2510 Atherosclerotic heart disease of native coronary artery without angina pectoris: Secondary | ICD-10-CM | POA: Insufficient documentation

## 2020-03-23 NOTE — Assessment & Plan Note (Signed)
She is now back on atorvastatin 80 mg.  Last labs showed LDL of 56.  This is well within goal.  With other risk factors, is not unreasonable to try to shoot for aggressive control with LDL less than 70.  She does have coronary calcification which has not yet been fully evaluated.

## 2020-03-23 NOTE — Assessment & Plan Note (Signed)
For now, since she has lots of other things going forward, we will hold off on any further CAD evaluation.  However if she is indeed potentially in need of lung surgery, I think I would then proceed with coronary CTA.  This would give [place] a coronary calcium score and extent of CAD.  She is not having anginal symptoms, therefore at this point ischemic evaluation as not associate warranted.  She wants to try to get the answer about the lung cancer question done.  I do think though that she will need ischemic evaluation prior to any surgeries.  Best option would be coronary CTA-CT FFR for anatomic and ischemic evaluation.Grace [name]

## 2020-03-23 NOTE — Assessment & Plan Note (Signed)
Borderline pressures today on losartan, hydralazine and bisoprolol.  She says at home her blood pressures are better controlled than they are here today.  Low threshold to increase hydralazine to 100 mg 3 times daily if pressures do not adequately control.  Losartan is at max dose, and limited beta-blocker titration because of COPD and resting heart rate 60 bpm.

## 2020-03-23 NOTE — Assessment & Plan Note (Signed)
No acute evaluation needed for bronchoscopy, however ending results of bronchoscopy, if lung surgery is required, we would probably need to do some type of evaluation.  She is limited by her dyspnea and therefore difficult to assess true functional status.  With known coronary calcification, I think coronary CTA-FFR would be the best option for ischemic evaluation; will probably also likely check 2D echocardiogram to get baseline cardiac function perioperatively.  She would like to wait to have any of the studies done until we are sure what is needed.

## 2020-03-27 ENCOUNTER — Other Ambulatory Visit: Payer: Self-pay | Admitting: General Practice

## 2020-03-27 DIAGNOSIS — R928 Other abnormal and inconclusive findings on diagnostic imaging of breast: Secondary | ICD-10-CM

## 2020-03-28 ENCOUNTER — Ambulatory Visit: Payer: Medicare Other | Admitting: Nurse Practitioner

## 2020-03-30 ENCOUNTER — Ambulatory Visit (INDEPENDENT_AMBULATORY_CARE_PROVIDER_SITE_OTHER): Payer: Medicare Other | Admitting: Pulmonary Disease

## 2020-03-30 ENCOUNTER — Other Ambulatory Visit: Payer: Self-pay

## 2020-03-30 ENCOUNTER — Ambulatory Visit: Payer: Medicare Other | Admitting: Pulmonary Disease

## 2020-03-30 DIAGNOSIS — J449 Chronic obstructive pulmonary disease, unspecified: Secondary | ICD-10-CM

## 2020-03-30 LAB — PULMONARY FUNCTION TEST
DL/VA % pred: 129 %
DL/VA: 5.46 ml/min/mmHg/L
DLCO cor % pred: 95 %
DLCO cor: 18.43 ml/min/mmHg
DLCO unc % pred: 92 %
DLCO unc: 17.71 ml/min/mmHg
FEF 25-75 Post: 0.45 L/sec
FEF 25-75 Pre: 0.44 L/sec
FEF2575-%Change-Post: 2 %
FEF2575-%Pred-Post: 21 %
FEF2575-%Pred-Pre: 20 %
FEV1-%Change-Post: -1 %
FEV1-%Pred-Post: 39 %
FEV1-%Pred-Pre: 40 %
FEV1-Post: 0.93 L
FEV1-Pre: 0.95 L
FEV1FVC-%Change-Post: -14 %
FEV1FVC-%Pred-Pre: 70 %
FEV6-%Change-Post: 11 %
FEV6-%Pred-Post: 63 %
FEV6-%Pred-Pre: 57 %
FEV6-Post: 1.88 L
FEV6-Pre: 1.69 L
FEV6FVC-%Change-Post: -5 %
FEV6FVC-%Pred-Post: 97 %
FEV6FVC-%Pred-Pre: 103 %
FVC-%Change-Post: 14 %
FVC-%Pred-Post: 65 %
FVC-%Pred-Pre: 56 %
FVC-Post: 2 L
FVC-Pre: 1.75 L
Post FEV1/FVC ratio: 47 %
Post FEV6/FVC ratio: 94 %
Pre FEV1/FVC ratio: 54 %
Pre FEV6/FVC Ratio: 99 %
RV % pred: 149 %
RV: 3.02 L
TLC % pred: 103 %
TLC: 5.09 L

## 2020-03-30 NOTE — Progress Notes (Signed)
PFT done today. 

## 2020-04-04 NOTE — Telephone Encounter (Signed)
Dr. Loanne Drilling please advise on patient email:   I know this is a really dumb question, and I meant to ask Dr. Loanne Drilling when we spoke, but I forgot. A friend has invited me to her home for Christmas dinner. There will be 8-12 people there. I am sure that all but the 64-year-old have been vaccinated. I know that 2 of them had their booster, but I do not know if the others have. The 34-year-olds mother has family members that have refused to get the booster, but I guess they have all been vaccinated. I know that is a long way around to my question, but I am wondering if going is a good idea. I am a little concerned about the number of people and the close proximity of everyone.

## 2020-04-12 ENCOUNTER — Ambulatory Visit: Payer: Medicare Other | Admitting: Pulmonary Disease

## 2020-04-13 LAB — FUNGUS CULTURE RESULT

## 2020-04-13 LAB — FUNGUS CULTURE WITH STAIN

## 2020-04-13 LAB — FUNGAL ORGANISM REFLEX

## 2020-04-19 ENCOUNTER — Other Ambulatory Visit: Payer: Self-pay

## 2020-04-19 ENCOUNTER — Ambulatory Visit (INDEPENDENT_AMBULATORY_CARE_PROVIDER_SITE_OTHER)
Admission: RE | Admit: 2020-04-19 | Discharge: 2020-04-19 | Disposition: A | Discharge status: Home / Self Care | Payer: Medicare Other | Source: Ambulatory Visit | Attending: Pulmonary Disease | Admitting: Pulmonary Disease

## 2020-04-19 DIAGNOSIS — R918 Other nonspecific abnormal finding of lung field: Secondary | ICD-10-CM | POA: Diagnosis not present

## 2020-04-24 ENCOUNTER — Encounter: Payer: Self-pay | Admitting: Pulmonary Disease

## 2020-04-24 ENCOUNTER — Ambulatory Visit (INDEPENDENT_AMBULATORY_CARE_PROVIDER_SITE_OTHER): Payer: Medicare Other | Admitting: Pulmonary Disease

## 2020-04-24 ENCOUNTER — Other Ambulatory Visit: Payer: Self-pay

## 2020-04-24 VITALS — BP 120/78 | HR 56 | Temp 97.8°F | Ht 63.0 in | Wt 186.2 lb

## 2020-04-24 DIAGNOSIS — R911 Solitary pulmonary nodule: Secondary | ICD-10-CM | POA: Diagnosis not present

## 2020-04-24 DIAGNOSIS — Z87891 Personal history of nicotine dependence: Secondary | ICD-10-CM | POA: Diagnosis not present

## 2020-04-24 DIAGNOSIS — G4733 Obstructive sleep apnea (adult) (pediatric): Secondary | ICD-10-CM | POA: Diagnosis not present

## 2020-04-24 DIAGNOSIS — J449 Chronic obstructive pulmonary disease, unspecified: Secondary | ICD-10-CM | POA: Diagnosis not present

## 2020-04-24 DIAGNOSIS — Z9989 Dependence on other enabling machines and devices: Secondary | ICD-10-CM

## 2020-04-24 MED ORDER — TRELEGY ELLIPTA 100-62.5-25 MCG/INH IN AEPB: 1.0000 | INHALATION_SPRAY | Freq: Every day | RESPIRATORY_TRACT | 0 refills | Status: DC

## 2020-04-24 MED ORDER — TRELEGY ELLIPTA 100-62.5-25 MCG/INH IN AEPB: 1.0000 | INHALATION_SPRAY | Freq: Every day | RESPIRATORY_TRACT | 5 refills | Status: DC

## 2020-04-24 NOTE — Progress Notes (Signed)
Inhaler instruction:   Patient was instructed on proper inhaler use of the new device. She was able to demonstrate appropriate technique.   Garner Nash, DO [facility] 04/24/2020 11:37 AM

## 2020-04-24 NOTE — Progress Notes (Signed)
Synopsis: Referred in November 2021 for normal CT chest by Andree Moro, DO  Subjective:   PATIENT ID: Grace [name] GENDER: female DOB: 31-Dec-1955, MRN: [id number]  Chief Complaint  Patient presents with  . Follow-up    Pt would like to try another inhaler.  She stated that she has been winded at times.  Thinks that it is due to lack of exercise and wt gain.     65 year old female, past medical history of Barrett's with high-grade dysplasia, COPD, diabetes, reflux, longstanding tobacco abuse quit in 2014.  Patient was enrolled in lung cancer screening program.  She had an abnormal lung cancer screening CT which led to recommendations for PET scan imaging.  This has been ordered and scheduled for tomorrow.  At baseline she has severe COPD, pulmonary function test last completed in 2020 which revealed a ratio of 46 and an FEV1 of 0.87 L, 35% predicted with a 23% bronchodilator response, evidence of air trapping with an RV of 185% and a DLCO of 73%.  OV 03/01/2020: Here today for review of lung cancer screening CT which completed on 02/20/2020.  This revealed a lung RADS B with a 3.9 cm left lower lobe mass.  There is concern for possible under line primary bronchogenic carcinoma versus infection.  Per patient states that she is not had any significant fevers but has had increased cough and sputum production.  This was approximately 10 days ago she did have her PET scan scheduled already for tomorrow.    OV 04/24/2020: Here today for follow-up after bronchoscopy and recent CT imaging.  Patient was taken after last visit for navigational bronchoscopy.  She had a PET scan with a PET avid lesion within the chest.  Bronchoscopy was negative.  There was no malignancy identified.  Patient then subsequently had short-term CT scan follow-up on 04/19/2020.  The CT scan revealed a original 4.1 x 3.2 cm lesion which was decreased in size to 3.5 x 2.3 cm.  This was reviewed today and the office.   Past Medical  History:  Diagnosis Date  . Abnormal vaginal bleeding   . Anxiety   . Asthma   . Barrett's esophagus    with high grade dysplasia per endoscopy 05/2005 // followed by Dr. [name] ([facility])  . Chronic back pain   . COPD (chronic obstructive pulmonary disease) ([place])   . Diabetes mellitus without complication ([place])    type 2  . Duodenal ulcer disease    thought to be contributed at least partly by overuse of NSAIDs and salicylates (goody powdr)  . Endometrial polyp 03/2003   s/p resection in 03/2003. Path showing submucosal leiomyoma and benign  proliferative type endometrium  . Family history of premature CAD 05/19/2018  . Fibroid uterus    s/p myomectomy x 2  . GERD (gastroesophageal reflux disease)   . H/O failed moderate sedation   . Hiatal hernia   . History of CVA (cerebrovascular accident) 10/2000  . 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 Hyperlipidemia   . Hypertension   . Hypothyroidism    with hx of multinodular goiter (noted on neck US in 11/2002 - showing diffuse nodularity and inhomogenous texture diffusely BL)  . Internal hemorrhoids    grade 2 per colonoscopy in 09/2006 - repeat colonoscopy rec in 5-10 years.  . Migraine   . Nevus   .  Personal history of failed conscious sedation 08/11/2011  . Sleep apnea    does not use cpap  . Stroke ([place]) 10/2000  . Tachycardia   . Thyroid disease   . Tobacco abuse Jan 2014   quit     Family History  Problem Relation Age of Onset  . COPD Mother   . Heart disease Father 26       had 5 heart attacks, first in late 25s  . COPD Father      Past Surgical History:  Procedure Laterality Date  . APPENDECTOMY  2017  . BIOPSY  02/15/2020   Procedure: BIOPSY;  Surgeon: Arta Silence, MD;  Location: [facility];  Service: Endoscopy;;  . BRONCHIAL BIOPSY  03/15/2020   Procedure: BRONCHIAL BIOPSIES;  Surgeon: Garner Nash, DO;   Location: [facility];  Service: Pulmonary;;  . BRONCHIAL BRUSHINGS  03/15/2020   Procedure: BRONCHIAL BRUSHINGS;  Surgeon: Garner Nash, DO;  Location: [facility];  Service: Pulmonary;;  . BRONCHIAL NEEDLE ASPIRATION BIOPSY  03/15/2020   Procedure: BRONCHIAL NEEDLE ASPIRATION BIOPSIES;  Surgeon: Garner Nash, DO;  Location: [facility];  Service: Pulmonary;;  . BRONCHIAL WASHINGS  03/15/2020   Procedure: BRONCHIAL WASHINGS;  Surgeon: Garner Nash, DO;  Location: [facility];  Service: Pulmonary;;  . CHOLECYSTECTOMY    . COLONOSCOPY  02/07/2010  . COLONOSCOPY WITH PROPOFOL N/A 02/15/2020   Procedure: COLONOSCOPY WITH PROPOFOL;  Surgeon: Arta Silence, MD;  Location: [facility];  Service: Endoscopy;  Laterality: N/A;  . DILATION AND CURETTAGE OF UTERUS    . ESOPHAGOGASTRODUODENOSCOPY (EGD) WITH PROPOFOL N/A 02/15/2020   Procedure: ESOPHAGOGASTRODUODENOSCOPY (EGD) WITH PROPOFOL;  Surgeon: Arta Silence, MD;  Location: [facility];  Service: Endoscopy;  Laterality: N/A;  . HYSTEROSCOPY    . LAPAROSCOPIC APPENDECTOMY N/A 09/23/2015   Procedure: APPENDECTOMY LAPAROSCOPIC;  Surgeon: Georganna Skeans, MD;  Location: [facility];  Service: General;  Laterality: N/A;  . [place]   x 2 - In 1997, noted to have extensive pelvic adhesions and BL tubal obstruction  . POLYPECTOMY  02/15/2020   Procedure: POLYPECTOMY;  Surgeon: Arta Silence, MD;  Location: [facility];  Service: Endoscopy;;  . TRANSTHORACIC ECHOCARDIOGRAM  06/2014    EF 55-60%.  No RWMA. Gr 1 DD. Cannot R/o PFO (consider bubble study).  Marland Kitchen UPPER GASTROINTESTINAL ENDOSCOPY  12/18/2009  . VIDEO BRONCHOSCOPY WITH ENDOBRONCHIAL NAVIGATION N/A 03/15/2020   Procedure: VIDEO BRONCHOSCOPY WITH ENDOBRONCHIAL NAVIGATION;  Surgeon: Garner Nash, DO;  Location: [place];  Service: Pulmonary;  Laterality: N/A;  . VIDEO BRONCHOSCOPY WITH ENDOBRONCHIAL ULTRASOUND  03/15/2020   Procedure: VIDEO BRONCHOSCOPY WITH  ENDOBRONCHIAL ULTRASOUND;  Surgeon: Garner Nash, DO;  Location: [facility];  Service: Pulmonary;;    Social History   Socioeconomic History  . Marital status: Divorced    Spouse name: Not on file  . Number of children: 0  . Years of education: 90  . Highest education level: High school graduate  Occupational History  . Occupation: Electronic record    Comment: [employer]  Tobacco Use  . Smoking status: Former Smoker    Packs/day: 3.00    Years: 50.00    Pack years: 150.00    Types: Cigarettes    Quit date: 05/10/2015    Years since quitting: 4.9  . Smokeless tobacco: Never Used  . Tobacco comment: Counseling sheet [id number]   Vaping Use  . Vaping Use: Never used  Substance and Sexual Activity  . Alcohol use: No  Alcohol/week: 0.0 standard drinks  . Drug use: No  . Sexual activity: Never    Birth control/protection: Post-menopausal  Other Topics Concern  . Not on file  Social History Narrative   Financial assistance approved for 100% discount at [facility] and has [facility] card per Bonna Gains   01/15/2010      Diet- N/A   Caffeine- Goody powders   Married-Divorced   [employer] and lives with mother   Pets-2   Current/past profession-[employer]   Exercise-No   Living will-No   DNR-No   POA/HPOA-No         Social Determinants of Health   Financial Resource Strain: Not on file  Food Insecurity: Not on file  Transportation Needs: No Transportation Needs  . Lack of Transportation (Medical): No  . Lack of Transportation (Non-Medical): No  Physical Activity: Not on file  Stress: Not on file  Social Connections: Not on file  Intimate Partner Violence: Not on file     Allergies  Allergen Reactions  . Amlodipine     Cramps   . Anoro Ellipta [Umeclidinium-Vilanterol] Other (See Comments)    Chest pain if used more than 3 consecutive days   . Codeine     REACTION: headaches  . Gabapentin     headache  . Prozac [Fluoxetine Hcl] Other (See Comments)    Suicidal  ideations     Outpatient Medications Prior to Visit  Medication Sig Dispense Refill  . albuterol (PROAIR HFA) 108 (90 Base) MCG/ACT inhaler INHALE 2 PUFFS EVERY 6  HOURS AS NEEDED FOR  SHORTNESS OF BREATH OR  WHEEZING (Patient taking differently: Inhale 2 puffs into the lungs every 6 (six) hours as needed for wheezing.) 34 g 3  . atorvastatin (LIPITOR) 80 MG tablet TAKE 1 TABLET BY MOUTH ONCE DAILY AT BEDTIME FOR  CHOLESTEROL (Patient taking differently: Take 80 mg by mouth at bedtime.) 90 tablet 3  . Biotin 5000 MCG TABS Take 5,000 mcg by mouth in the morning and at bedtime.     . bisoprolol (ZEBETA) 10 MG tablet Take 10 mg by mouth daily.    Marland Kitchen estradiol (ESTRACE VAGINAL) 0.1 MG/GM vaginal cream Place 1 Applicatorful vaginally at bedtime. (Patient taking differently: Place 1 Applicatorful vaginally 2 (two) times a week.) 42.5 g 12  . fluticasone-salmeterol (ADVAIR HFA) 230-21 MCG/ACT inhaler Inhale 2 puffs into the lungs 2 (two) times daily. 12 g 6  . glipiZIDE (GLUCOTROL XL) 5 MG 24 hr tablet TAKE 1 TABLET BY MOUTH  DAILY WITH BREAKFAST (Patient taking differently: Take 5 mg by mouth every evening.) 90 tablet 1  . glucose blood (ONETOUCH VERIO) test strip Use to check blood sugar twice daily. Dx: E11.9 300 each 1  . hydrALAZINE (APRESOLINE) 50 MG tablet TAKE 1 TABLET BY MOUTH 3  TIMES DAILY (Patient taking differently: Take 50 mg by mouth 3 (three) times daily.) 270 tablet 3  . ipratropium-albuterol (DUONEB) 0.5-2.5 (3) MG/3ML SOLN Take 3 mLs by nebulization every 6 (six) hours as needed. (Patient taking differently: Take 3 mLs by nebulization every 6 (six) hours as needed (wheezing/shortness of breath.).) 360 mL 6  . Lancets (ONETOUCH DELICA PLUS [id number]) MISC USE TO CHECK BLOOD SUGAR  TWICE DAILY 200 each 3  . levothyroxine (SYNTHROID) 137 MCG tablet TAKE 1 TABLET BY MOUTH  DAILY BEFORE BREAKFAST (Patient taking differently: Take 137 mcg by mouth daily before breakfast.) 90 tablet 2  .  LORazepam (ATIVAN) 0.5 MG tablet Take 0.5 mg by mouth 3 (three) times  daily as needed for anxiety.    Marland Kitchen losartan (COZAAR) 100 MG tablet TAKE 1 TABLET BY MOUTH  DAILY (Patient taking differently: Take 100 mg by mouth every evening.) 90 tablet 3  . omeprazole (PRILOSEC) 20 MG capsule Take 30- 60 min before your first and last meals of the day (Patient taking differently: Take 20 mg by mouth 2 (two) times daily before a meal. Take 30- 60 min before your first and last meals of the day) 180 capsule 3  . Respiratory Therapy Supplies (NEBULIZER/TUBING/MOUTHPIECE) KIT 1 application by Does not apply route every 6 (six) hours as needed. FAX TO [phone] 1 kit 11  . Tiotropium Bromide Monohydrate (SPIRIVA RESPIMAT) 2.5 MCG/ACT AERS USE 2 INHALATIONS BY MOUTH  ONCE DAILY (Patient taking differently: Inhale 2 puffs into the lungs daily.) 12 g 3  . valACYclovir (VALTREX) 500 MG tablet TAKE 1 TABLET BY MOUTH  DAILY (Patient taking differently: Take 500 mg by mouth daily.) 90 tablet 3  . bisoprolol (ZEBETA) 5 MG tablet One twice daily (Patient not taking: Reported on 04/24/2020) 60 tablet 11   No facility-administered medications prior to visit.    Review of Systems  Constitutional: Negative for chills, fever, malaise/fatigue and weight loss.  HENT: Negative for hearing loss, sore throat and tinnitus.   Eyes: Negative for blurred vision and double vision.  Respiratory: Positive for shortness of breath. Negative for cough, hemoptysis, sputum production, wheezing and stridor.   Cardiovascular: Negative for chest pain, palpitations, orthopnea, leg swelling and PND.  Gastrointestinal: Negative for abdominal pain, constipation, diarrhea, heartburn, nausea and vomiting.  Genitourinary: Negative for dysuria, hematuria and urgency.  Musculoskeletal: Negative for joint pain and myalgias.  Skin: Negative for itching and rash.  Neurological: Negative for dizziness, tingling, weakness and headaches.   Endo/Heme/Allergies: Negative for environmental allergies. Does not bruise/bleed easily.  Psychiatric/Behavioral: Negative for depression. The patient is not nervous/anxious and does not have insomnia.   All other systems reviewed and are negative.    Objective:  Physical Exam Vitals reviewed.  Constitutional:      General: She is not in acute distress.    Appearance: She is well-developed. She is obese.  HENT:     Head: Normocephalic and atraumatic.     Mouth/Throat:     Mouth: Oropharynx is clear and moist.     Pharynx: No oropharyngeal exudate.  Eyes:     Extraocular Movements: EOM normal.     Conjunctiva/sclera: Conjunctivae normal.     Pupils: Pupils are equal, round, and reactive to light.  Neck:     Vascular: No JVD.     Trachea: No tracheal deviation.  Cardiovascular:     Rate and Rhythm: Normal rate and regular rhythm.     Pulses: Intact distal pulses.     Heart sounds: S1 normal and S2 normal.     Comments: Distant heart tones Pulmonary:     Effort: No tachypnea or accessory muscle usage.     Breath sounds: No stridor. Decreased breath sounds (throughout all lung fields) present. No wheezing, rhonchi or rales.     Comments: Increased AP chest diameter Abdominal:     General: Bowel sounds are normal. There is no distension.     Palpations: Abdomen is soft.     Tenderness: There is no abdominal tenderness.  Musculoskeletal:        General: Deformity (muscle wasting ) present. No edema.  Skin:    General: Skin is warm and dry.     Capillary  Refill: Capillary refill takes less than 2 seconds.     Findings: No rash.  Neurological:     Mental Status: She is alert and oriented to person, place, and time.  Psychiatric:        Mood and Affect: Mood and affect normal.        Behavior: Behavior normal.      Vitals:   04/24/20 1102  BP: 120/78  Pulse: (!) 56  Temp: 97.8 F (36.6 C)  TempSrc: Tympanic  SpO2: 98%  Weight: 186 lb 4 oz (84.5 kg)  Height: 5' 3"   (1.6 m)   98% on RA BMI Readings from Last 3 Encounters:  04/24/20 32.99 kg/m  03/15/20 32.77 kg/m  03/12/20 32.77 kg/m   Wt Readings from Last 3 Encounters:  04/24/20 186 lb 4 oz (84.5 kg)  03/15/20 185 lb (83.9 kg)  03/12/20 185 lb (83.9 kg)     CBC    Component Value Date/Time   WBC 6.9 03/15/2020 0703   RBC 4.04 03/15/2020 0703   HGB 12.2 03/15/2020 0703   HGB 12.1 10/08/2015 1407   HCT 39.0 03/15/2020 0703   HCT 38.1 10/08/2015 1407   PLT 228 03/15/2020 0703   PLT 633 (H) 10/08/2015 1407   MCV 96.5 03/15/2020 0703   MCV 90 10/08/2015 1407   MCH 30.2 03/15/2020 0703   MCHC 31.3 03/15/2020 0703   RDW 14.3 03/15/2020 0703   RDW 14.8 10/08/2015 1407   LYMPHSABS 1,778 09/28/2019 1211   LYMPHSABS 1.7 10/08/2015 1407   MONOABS 0.7 02/07/2019 1548   EOSABS 299 09/28/2019 1211   EOSABS 0.2 10/08/2015 1407   BASOSABS 26 09/28/2019 1211   BASOSABS 0.0 10/08/2015 1407    Chest Imaging: 02/20/2020 lung cancer screening CT: She has a 3.9 cm left lower lobe mass concerning for malignancy versus infection.  There is some areas around the lesion that looks more inflammatory than specifically related to mass.  I think the next best steps are PET scan imaging which have been ordered.  We will follow up on this images and make appropriate recommendations. The patient's images have been independently reviewed by me.    04/19/2020: CT scan of the chest: The previously identified lung mass had decreased in size now down to 3.5 x 2.3 cm. The patient's images have been independently reviewed by me.     Pulmonary Functions Testing Results: PFT Results Latest Ref Rng & Units 03/30/2020 04/26/2018  FVC-Pre L 1.75 1.56  FVC-Predicted Pre % 56 49  FVC-Post L 2.00 1.88  FVC-Predicted Post % 65 60  Pre FEV1/FVC % % 54 45  Post FEV1/FCV % % 47 46  FEV1-Pre L 0.95 0.70  FEV1-Predicted Pre % 40 29  FEV1-Post L 0.93 0.87  DLCO uncorrected ml/min/mmHg 17.71 16.94  DLCO UNC% % 92 73   DLCO corrected ml/min/mmHg 18.43 -  DLCO COR %Predicted % 95 -  DLVA Predicted % 129 115  TLC L 5.09 5.62  TLC % Predicted % 103 114  RV % Predicted % 149 185    FeNO:   Pathology:   Bronchoscopy 03/15/2020: Cytology all negative for malignancy  Echocardiogram:   Heart Catheterization:     Assessment & Plan:     ICD-10-CM   1. Lung nodule  R91.1 CT Chest Wo Contrast  2. COPD GOLD III  J44.9   3. Stage 3 severe COPD by GOLD classification ([place])  J44.9   4. OSA on CPAP  G47.33    Z99.89  5. Former smoker  Z87.891     Discussion:  This is a 65 year old female longstanding history of smoking, quit in 2014, severe COPD baseline.  Initially had an abnormal lung cancer screening CT.  Patient underwent PET scan imaging which revealed elevated intense PET uptake.  Patient was taken for navigational bronchoscopy with tissue sampling all biopsy specimens were negative for malignancy.  Cultures were also negative.  She has a history of OSA currently noncompliant with CPAP.  Unable to wear a fullface mask.  Would like to meet with a sleep doctor again.  Plan: Patient had subsequent follow-up imaging which revealed decrease in size of the previously noted mass. Patient was treated with antimicrobials. I explained today in the office I felt like we are likely dealing with a inflammatory/postinfectious type lesion. As for his COPD she does have ongoing dyspnea on exertion. New new prescription today given for Trelegy inhaler Patient was instructed on how to use new inhaler.  Please see separate documentation. Please set patient up with sleep doctor for next available sleep consultation. Recommend repeat noncontrast CT of the chest in 6 months to ensure complete resolution of this inflammatory lesion that was initially identified.    Current Outpatient Medications:  .  albuterol (PROAIR HFA) 108 (90 Base) MCG/ACT inhaler, INHALE 2 PUFFS EVERY 6  HOURS AS NEEDED FOR  SHORTNESS OF  BREATH OR  WHEEZING (Patient taking differently: Inhale 2 puffs into the lungs every 6 (six) hours as needed for wheezing.), Disp: 34 g, Rfl: 3 .  atorvastatin (LIPITOR) 80 MG tablet, TAKE 1 TABLET BY MOUTH ONCE DAILY AT BEDTIME FOR  CHOLESTEROL (Patient taking differently: Take 80 mg by mouth at bedtime.), Disp: 90 tablet, Rfl: 3 .  Biotin 5000 MCG TABS, Take 5,000 mcg by mouth in the morning and at bedtime. , Disp: , Rfl:  .  bisoprolol (ZEBETA) 10 MG tablet, Take 10 mg by mouth daily., Disp: , Rfl:  .  estradiol (ESTRACE VAGINAL) 0.1 MG/GM vaginal cream, Place 1 Applicatorful vaginally at bedtime. (Patient taking differently: Place 1 Applicatorful vaginally 2 (two) times a week.), Disp: 42.5 g, Rfl: 12 .  fluticasone-salmeterol (ADVAIR HFA) 230-21 MCG/ACT inhaler, Inhale 2 puffs into the lungs 2 (two) times daily., Disp: 12 g, Rfl: 6 .  glipiZIDE (GLUCOTROL XL) 5 MG 24 hr tablet, TAKE 1 TABLET BY MOUTH  DAILY WITH BREAKFAST (Patient taking differently: Take 5 mg by mouth every evening.), Disp: 90 tablet, Rfl: 1 .  glucose blood (ONETOUCH VERIO) test strip, Use to check blood sugar twice daily. Dx: E11.9, Disp: 300 each, Rfl: 1 .  hydrALAZINE (APRESOLINE) 50 MG tablet, TAKE 1 TABLET BY MOUTH 3  TIMES DAILY (Patient taking differently: Take 50 mg by mouth 3 (three) times daily.), Disp: 270 tablet, Rfl: 3 .  ipratropium-albuterol (DUONEB) 0.5-2.5 (3) MG/3ML SOLN, Take 3 mLs by nebulization every 6 (six) hours as needed. (Patient taking differently: Take 3 mLs by nebulization every 6 (six) hours as needed (wheezing/shortness of breath.).), Disp: 360 mL, Rfl: 6 .  Lancets (ONETOUCH DELICA PLUS [id number]) MISC, USE TO CHECK BLOOD SUGAR  TWICE DAILY, Disp: 200 each, Rfl: 3 .  levothyroxine (SYNTHROID) 137 MCG tablet, TAKE 1 TABLET BY MOUTH  DAILY BEFORE BREAKFAST (Patient taking differently: Take 137 mcg by mouth daily before breakfast.), Disp: 90 tablet, Rfl: 2 .  LORazepam (ATIVAN) 0.5 MG tablet, Take 0.5  mg by mouth 3 (three) times daily as needed for anxiety., Disp: , Rfl:  .  losartan (  COZAAR) 100 MG tablet, TAKE 1 TABLET BY MOUTH  DAILY (Patient taking differently: Take 100 mg by mouth every evening.), Disp: 90 tablet, Rfl: 3 .  omeprazole (PRILOSEC) 20 MG capsule, Take 30- 60 min before your first and last meals of the day (Patient taking differently: Take 20 mg by mouth 2 (two) times daily before a meal. Take 30- 60 min before your first and last meals of the day), Disp: 180 capsule, Rfl: 3 .  Respiratory Therapy Supplies (NEBULIZER/TUBING/MOUTHPIECE) KIT, 1 application by Does not apply route every 6 (six) hours as needed. FAX TO [phone], Disp: 1 kit, Rfl: 11 .  Tiotropium Bromide Monohydrate (SPIRIVA RESPIMAT) 2.5 MCG/ACT AERS, USE 2 INHALATIONS BY MOUTH  ONCE DAILY (Patient taking differently: Inhale 2 puffs into the lungs daily.), Disp: 12 g, Rfl: 3 .  valACYclovir (VALTREX) 500 MG tablet, TAKE 1 TABLET BY MOUTH  DAILY (Patient taking differently: Take 500 mg by mouth daily.), Disp: 90 tablet, Rfl: 3   I spent 32 minutes dedicated to the care of this patient on the date of this encounter to include pre-visit review of records, face-to-face time with the patient discussing conditions above, post visit ordering of testing, clinical documentation with the electronic health record, making appropriate referrals as documented, and communicating necessary findings to members of the patients care team.     Garner Nash, DO [facility] 04/24/2020 11:14 AM

## 2020-04-24 NOTE — Patient Instructions (Addendum)
Thank you for visiting Dr. Valeta Harms at [facility]. Today we recommend the following:  Orders Placed This Encounter  Procedures  . CT Chest Wo Contrast   Meds ordered this encounter  Medications  . Fluticasone-Umeclidin-Vilant (TRELEGY ELLIPTA) 100-62.5-25 MCG/INH AEPB    Sig: Inhale 1 puff into the lungs daily.    Dispense:  60 each    Refill:  5   Return in about 5 months (around 09/22/2020) for with APP or Dr. Valeta Harms.    Please do your part to reduce the spread of COVID-19.

## 2020-04-27 LAB — ACID FAST CULTURE WITH REFLEXED SENSITIVITIES (MYCOBACTERIA)
Acid Fast Culture: NEGATIVE
Acid Fast Culture: NEGATIVE
Acid Fast Culture: NEGATIVE

## 2020-05-08 ENCOUNTER — Telehealth: Payer: Self-pay | Admitting: Gastroenterology

## 2020-05-08 NOTE — Telephone Encounter (Signed)
Patient discussed with Dr. Paulita Fujita of [facility]. Patient with known prior Barrett's with dysplasia who hadn't had follow up in years. Seen by Dr. Paulita Fujita with EGD in 11/21 with findings of Barrett's with some ulceration/nodularity with HGD possible invasiveness being noted on pathology.  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 Referred to [facility] for EGD EMR with possible dilation but patient never scheduled this due to transportation. Asked to consider evaluation here in [place].  I think reasonable case for possible EGD/EUS with EMR if no overt/signficant disease is noted and then possible RFA ablation if no severe disease is noted.  Happy to see patient in consultation and then set up for procedures in February v March based on availability.  Patty and Yesi, please be on look out from [place] [facility] for this patient.  Please schedule for a clinic appointment (as long as she has insurance to see [place]) on 2/15 @230PM  or 2/17 @350PM  on 2/18 (multiple slots availabe).  Please let Dr. Paulita Fujita and I know when things have been arranged for clinic.   GM

## 2020-05-11 ENCOUNTER — Encounter: Payer: Self-pay | Admitting: Gastroenterology

## 2020-05-11 NOTE — Telephone Encounter (Signed)
Hi Patty, We received a referral from Dr. Erlinda Hong office today regarding the message below from Dr. Rush Landmark. Pls advise on scheduling. Thank you.

## 2020-05-11 NOTE — Telephone Encounter (Signed)
Received the following message from patient:   "I am having a lot of trouble with my breathing, and dealing with awful, and I do mean awful reflux, in addition to congestion and coughing up quite a bit of stuff. It is mostly white, sometimes yellowish. I am just wondering if the reflux could be causing me to have so much trouble breathing. I am having to use my rescue inhaler more than usual and the nebulizer also. I saw Dr. Paulita Fujita Tuesday and told him about the reflux, but he did not change my meds. I did ask him to find a doctor in the [place] to do the upper endoscopy and ultrasound and I am waiting to hear from that."  Dr. Valeta Harms, can you please advise? Thanks!

## 2020-05-11 NOTE — Telephone Encounter (Signed)
Pt is scheduled for appt on 05/29/20 at 2:30pm. I will ask Yesi to put appt in Epic.

## 2020-05-11 NOTE — Telephone Encounter (Signed)
Tye Maryland can you please schedule the appt per Dr Jannifer Franklin note.  See below. Thanks    Please schedule for a clinic appointment (as long as she has insurance to see [place]) on 2/15 @230PM  or 2/17 @350PM  on 2/18 (multiple slots availabe).

## 2020-05-29 ENCOUNTER — Other Ambulatory Visit: Payer: Self-pay

## 2020-05-29 ENCOUNTER — Other Ambulatory Visit (INDEPENDENT_AMBULATORY_CARE_PROVIDER_SITE_OTHER): Payer: Medicare Other

## 2020-05-29 ENCOUNTER — Ambulatory Visit (INDEPENDENT_AMBULATORY_CARE_PROVIDER_SITE_OTHER): Payer: Medicare Other | Admitting: Gastroenterology

## 2020-05-29 ENCOUNTER — Encounter: Payer: Self-pay | Admitting: Gastroenterology

## 2020-05-29 VITALS — BP 150/100 | HR 64 | Ht 63.0 in | Wt 182.0 lb

## 2020-05-29 DIAGNOSIS — R198 Other specified symptoms and signs involving the digestive system and abdomen: Secondary | ICD-10-CM

## 2020-05-29 DIAGNOSIS — K2100 Gastro-esophageal reflux disease with esophagitis, without bleeding: Secondary | ICD-10-CM

## 2020-05-29 DIAGNOSIS — R1310 Dysphagia, unspecified: Secondary | ICD-10-CM

## 2020-05-29 DIAGNOSIS — K22719 Barrett's esophagus with dysplasia, unspecified: Secondary | ICD-10-CM

## 2020-05-29 DIAGNOSIS — K315 Obstruction of duodenum: Secondary | ICD-10-CM

## 2020-05-29 DIAGNOSIS — Z8711 Personal history of peptic ulcer disease: Secondary | ICD-10-CM

## 2020-05-29 LAB — BASIC METABOLIC PANEL
BUN: 22 mg/dL (ref 6–23)
CO2: 28 mEq/L (ref 19–32)
Calcium: 9.4 mg/dL (ref 8.4–10.5)
Chloride: 102 mEq/L (ref 96–112)
Creatinine, Ser: 0.8 mg/dL (ref 0.40–1.20)
GFR: 77.52 mL/min (ref >60.00)
Glucose, Bld: 106 mg/dL — ABNORMAL HIGH (ref 70–99)
Potassium: 4 mEq/L (ref 3.5–5.1)
Sodium: 138 mEq/L (ref 135–145)

## 2020-05-29 LAB — CBC
HCT: 40.7 % (ref 36.0–46.0)
Hemoglobin: 13.4 g/dL (ref 12.0–15.0)
MCHC: 32.9 g/dL (ref 30.0–36.0)
MCV: 92.1 fl (ref 78.0–100.0)
Platelets: 223 10*3/uL (ref 150.0–400.0)
RBC: 4.42 Mil/uL (ref 3.87–5.11)
RDW: 14.9 % (ref 11.5–15.5)
WBC: 7.7 10*3/uL (ref 4.0–10.5)

## 2020-05-29 LAB — PROTIME-INR
INR: 1 ratio (ref 0.8–1.0)
Prothrombin Time: 11.7 s (ref 9.6–13.1)

## 2020-05-29 NOTE — Patient Instructions (Addendum)
If you are age 65 or older, your body mass index should be between 23-30. Your Body mass index is 32.24 kg/m. If this is out of the aforementioned range listed, please consider follow up with your Primary Care Provider.  You have been scheduled for an endoscopy. Please follow written instructions given to you at your visit today. If you use inhalers (even only as needed), please bring them with you on the day of your procedure.   Your provider has requested that you go to the basement level for lab work before leaving today. Press "B" on the elevator. The lab is located at the first door on the left as you exit the elevator.  Due to recent changes in healthcare laws, you may see the results of your imaging and laboratory studies on MyChart before your provider has had a chance to review them.  We understand that in some cases there may be results that are confusing or concerning to you. Not all laboratory results come back in the same time frame and the provider may be waiting for multiple results in order to interpret others.  Please give [place] 48 hours in order for your provider to thoroughly review all the results before contacting the office for clarification of your results.    You have been scheduled for an Upper GI Series at [facility] . Your appointment is on 06/06/20 at 10:30am. Please arrive 15 minutes prior to your test for registration. Make sure not to eat or drink anything after midnight on the night before your test. If you need to reschedule, please call radiology at [phone]. ________________________________________________________________ An upper GI series uses x rays to help diagnose problems of the upper GI tract, which includes the esophagus, stomach, and duodenum. The duodenum is the first part of the small intestine. An upper GI series is conducted by a radiology technologist or a radiologist--a doctor who specializes in x-ray imaging--at a hospital or outpatient  center. While sitting or standing in front of an x-ray machine, the patient drinks barium liquid, which is often white and has a chalky consistency and taste. The barium liquid coats the lining of the upper GI tract and makes signs of disease show up more clearly on x rays. X-ray video, called fluoroscopy, is used to view the barium liquid moving through the esophagus, stomach, and duodenum. Additional x rays and fluoroscopy are performed while the patient lies on an x-ray table. To fully coat the upper GI tract with barium liquid, the technologist or radiologist may press on the abdomen or ask the patient to change position. Patients hold still in various positions, allowing the technologist or radiologist to take x rays of the upper GI tract at different angles. If a technologist conducts the upper GI series, a radiologist will later examine the images to look for problems.  This test typically takes about 1 hour to complete. __________________________________________________________________  Thank you for choosing me and [facility].  Dr. Rush Landmark

## 2020-05-29 NOTE — Progress Notes (Signed)
[place] VISIT   Primary Care Provider Andree Moro, [place] [address] [phone]  Referring Provider Arta Silence, MD [phone] [address] [place],  [place] [address] [phone]  Patient Profile: Grace [name] is a 65 y.o. female with a pmh significant for COPD, diabetes, prior stroke, hypertension, hyperlipidemia, hypothyroidism, sleep apnea, anxiety, status post appendectomy, status post hysterectomy, status post cholecystectomy, PUD (manifested previously as ulcer disease and now duodenal stricturing), GERD, Barrett's esophagus (high-grade dysplasia at least), hemorrhoids, colon polyps.  The patient presents to the [facility] for an evaluation and management of problem(s) noted below:  Problem List 1. Barrett's esophagus with dysplasia   2. Abnormal findings on esophagogastroduodenoscopy (EGD)   3. Dysphagia, unspecified type   4. Duodenal stricture   5. Gastroesophageal reflux disease with esophagitis without hemorrhage   6. History of peptic ulcer disease     History of Present Illness This is the patient's 1st visit to the outpatient [facility] clinic in years.  She had previously seen Dr. Carlean Purl up until 2013.  She had upper and lower endoscopies.  Patient eventually was lost to follow-up.  She followed up thereafter with Dr. Paulita Fujita of [facility].Marland Kitchen  The patient had a history of known Barrett's esophagus and underwent repeat endoscopic evaluation in 2021.  Endoscopic evaluation showed evidence of Barrett's esophagus with high-grade dysplasia and an ulceration suggestive of potential invasion although overt adenocarcinoma was not described.  Patient was referred to [facility] for consideration of further endoscopic therapies.  She had a telemedicine visit with Dr. Newman Pies who had suggested an EGD EUS with possible ablative therapies thereafter depending on findings at time of endoscopic  ultrasound.  Patient is not able to follow-up at [facility] due to transportation issues.  As such she reached out to Dr. Paulita Fujita and Dr. Paulita Fujita subsequently reach out to me to consider if further therapies can be performed here in [place].  Patient states that she has had longstanding acid reflux but symptoms are relatively stable on her PPI therapy.  She deals with belching on a frequent basis.  She has some chest discomfort that occurs at times which she attributes to her acid reflux.  She has had longstanding issues with dysphagia.  She has no family history of GI malignancies.  The patient does not take significant nonsteroidals or BC/Goody powders.  GI Review of Systems Positive as above Negative for odynophagia, nausea, vomiting, pain, change in bowel habits, melena, hematochezia  Review of Systems General: Denies fevers/chills/weight loss unintentionally HEENT: Denies oral lesions Cardiovascular: Denies current chest pain/palpitations Pulmonary: Denies shortness of breath/nocturnal cough Gastroenterological: See HPI Genitourinary: Denies darkened urine Hematological: Denies easy bruising/bleeding Dermatological: Denies jaundice Psychological: Mood is anxious about undergoing further procedures.   Medications Current Outpatient Medications  Medication Sig Dispense Refill  . albuterol (PROAIR HFA) 108 (90 Base) MCG/ACT inhaler INHALE 2 PUFFS EVERY 6  HOURS AS NEEDED FOR  SHORTNESS OF BREATH OR  WHEEZING (Patient taking differently: Inhale 2 puffs into the lungs every 6 (six) hours as needed for wheezing.) 34 g 3  . atorvastatin (LIPITOR) 80 MG tablet TAKE 1 TABLET BY MOUTH ONCE DAILY AT BEDTIME FOR  CHOLESTEROL (Patient taking differently: Take 80 mg by mouth at bedtime.) 90 tablet 3  . Biotin 5000 MCG TABS Take 5,000 mcg by mouth in the morning and at bedtime.     . bisoprolol (ZEBETA) 10 MG tablet Take 10 mg by mouth  daily.    Marland Kitchen CINNAMON PO Take by mouth. 1500 mg twice a  day    . estradiol (ESTRACE VAGINAL) 0.1 MG/GM vaginal cream Place 1 Applicatorful vaginally at bedtime. (Patient taking differently: Place 1 Applicatorful vaginally 2 (two) times a week.) 42.5 g 12  . Fluticasone-Umeclidin-Vilant (TRELEGY ELLIPTA) 100-62.5-25 MCG/INH AEPB Inhale 1 puff into the lungs daily. 60 each 5  . glipiZIDE (GLUCOTROL XL) 5 MG 24 hr tablet TAKE 1 TABLET BY MOUTH  DAILY WITH BREAKFAST (Patient taking differently: Take 5 mg by mouth every evening.) 90 tablet 1  . glucose blood (ONETOUCH VERIO) test strip Use to check blood sugar twice daily. Dx: E11.9 300 each 1  . hydrALAZINE (APRESOLINE) 50 MG tablet TAKE 1 TABLET BY MOUTH 3  TIMES DAILY (Patient taking differently: Take 50 mg by mouth 3 (three) times daily.) 270 tablet 3  . ipratropium-albuterol (DUONEB) 0.5-2.5 (3) MG/3ML SOLN Take 3 mLs by nebulization every 6 (six) hours as needed. (Patient taking differently: Take 3 mLs by nebulization every 6 (six) hours as needed (wheezing/shortness of breath.).) 360 mL 6  . Lancets (ONETOUCH DELICA PLUS [id number]) MISC USE TO CHECK BLOOD SUGAR  TWICE DAILY 200 each 3  . levothyroxine (SYNTHROID) 137 MCG tablet TAKE 1 TABLET BY MOUTH  DAILY BEFORE BREAKFAST (Patient taking differently: Take 137 mcg by mouth daily before breakfast.) 90 tablet 2  . LORazepam (ATIVAN) 0.5 MG tablet Take 0.5 mg by mouth 3 (three) times daily as needed for anxiety.    Marland Kitchen losartan (COZAAR) 100 MG tablet TAKE 1 TABLET BY MOUTH  DAILY (Patient taking differently: Take 100 mg by mouth every evening.) 90 tablet 3  . Magnesium 250 MG TABS Take by mouth 2 (two) times daily.    Marland Kitchen omeprazole (PRILOSEC) 20 MG capsule Take 30- 60 min before your first and last meals of the day (Patient taking differently: Take 20 mg by mouth 2 (two) times daily before a meal. Take 30- 60 min before your first and last meals of the day) 180 capsule 3  . Respiratory Therapy Supplies (NEBULIZER/TUBING/MOUTHPIECE) KIT 1 application by Does not  apply route every 6 (six) hours as needed. FAX TO [phone] 1 kit 11  . Tiotropium Bromide Monohydrate (SPIRIVA RESPIMAT) 2.5 MCG/ACT AERS USE 2 INHALATIONS BY MOUTH  ONCE DAILY (Patient taking differently: Inhale 2 puffs into the lungs daily.) 12 g 3  . valACYclovir (VALTREX) 500 MG tablet TAKE 1 TABLET BY MOUTH  DAILY (Patient taking differently: Take 500 mg by mouth daily.) 90 tablet 3   No current facility-administered medications for this visit.    Allergies Allergies  Allergen Reactions  . Amlodipine     Cramps   . Codeine     REACTION: headaches  . Gabapentin     headache  . Prozac [Fluoxetine Hcl] Other (See Comments)    Suicidal ideations    Histories Past Medical History:  Diagnosis Date  . Abnormal vaginal bleeding   . Anxiety   . Asthma   . Barrett's esophagus    with high grade dysplasia per endoscopy 05/2005 // followed by Dr. [name] ([facility])  . Chronic back pain   . COPD (chronic obstructive pulmonary disease) ([place])   . Diabetes mellitus without complication ([place])    type 2  . Duodenal ulcer disease    thought to be contributed at least partly by overuse of NSAIDs and salicylates (goody powdr)  . Endometrial polyp 03/2003   s/p resection in 03/2003. Path  showing submucosal leiomyoma and benign  proliferative type endometrium  . Family history of premature CAD 05/19/2018  . Fibroid uterus    s/p myomectomy x 2  . GERD (gastroesophageal reflux disease)   . H/O failed moderate sedation   . Hiatal hernia   . History of CVA (cerebrovascular accident) 10/2000  . 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 Hyperlipidemia   . Hypertension   . Hypothyroidism    with hx of multinodular goiter (noted on neck US in 11/2002 - showing diffuse nodularity and inhomogenous texture diffusely BL)  . Internal hemorrhoids    grade 2 per colonoscopy in 09/2006 - repeat colonoscopy  rec in 5-10 years.  . Migraine   . Nevus   . Personal history of failed conscious sedation 08/11/2011  . Sleep apnea    does not use cpap  . Stroke ([place]) 10/2000  . Tachycardia   . Thyroid disease   . Tobacco abuse Jan 2014   quit   Past Surgical History:  Procedure Laterality Date  . APPENDECTOMY  2017  . BIOPSY  02/15/2020   Procedure: BIOPSY;  Surgeon: Arta Silence, MD;  Location: [facility];  Service: Endoscopy;;  . BRONCHIAL BIOPSY  03/15/2020   Procedure: BRONCHIAL BIOPSIES;  Surgeon: Garner Nash, DO;  Location: [facility];  Service: Pulmonary;;  . BRONCHIAL BRUSHINGS  03/15/2020   Procedure: BRONCHIAL BRUSHINGS;  Surgeon: Garner Nash, DO;  Location: [facility];  Service: Pulmonary;;  . BRONCHIAL NEEDLE ASPIRATION BIOPSY  03/15/2020   Procedure: BRONCHIAL NEEDLE ASPIRATION BIOPSIES;  Surgeon: Garner Nash, DO;  Location: [facility];  Service: Pulmonary;;  . BRONCHIAL WASHINGS  03/15/2020   Procedure: BRONCHIAL WASHINGS;  Surgeon: Garner Nash, DO;  Location: [facility];  Service: Pulmonary;;  . CHOLECYSTECTOMY    . COLONOSCOPY  02/07/2010  . COLONOSCOPY WITH PROPOFOL N/A 02/15/2020   Procedure: COLONOSCOPY WITH PROPOFOL;  Surgeon: Arta Silence, MD;  Location: [facility];  Service: Endoscopy;  Laterality: N/A;  . DILATION AND CURETTAGE OF UTERUS    . ESOPHAGOGASTRODUODENOSCOPY (EGD) WITH PROPOFOL N/A 02/15/2020   Procedure: ESOPHAGOGASTRODUODENOSCOPY (EGD) WITH PROPOFOL;  Surgeon: Arta Silence, MD;  Location: [facility];  Service: Endoscopy;  Laterality: N/A;  . HYSTEROSCOPY    . LAPAROSCOPIC APPENDECTOMY N/A 09/23/2015   Procedure: APPENDECTOMY LAPAROSCOPIC;  Surgeon: Georganna Skeans, MD;  Location: [facility];  Service: General;  Laterality: N/A;  . [place]   x 2 - In 1997, noted to have extensive pelvic adhesions and BL tubal obstruction  . POLYPECTOMY  02/15/2020   Procedure: POLYPECTOMY;  Surgeon: Arta Silence, MD;  Location:  [facility];  Service: Endoscopy;;  . TRANSTHORACIC ECHOCARDIOGRAM  06/2014    EF 55-60%.  No RWMA. Gr 1 DD. Cannot R/o PFO (consider bubble study).  Marland Kitchen UPPER GASTROINTESTINAL ENDOSCOPY  12/18/2009  . VIDEO BRONCHOSCOPY WITH ENDOBRONCHIAL NAVIGATION N/A 03/15/2020   Procedure: VIDEO BRONCHOSCOPY WITH ENDOBRONCHIAL NAVIGATION;  Surgeon: Garner Nash, DO;  Location: [facility];  Service: Pulmonary;  Laterality: N/A;  . VIDEO BRONCHOSCOPY WITH ENDOBRONCHIAL ULTRASOUND  03/15/2020   Procedure: VIDEO BRONCHOSCOPY WITH ENDOBRONCHIAL ULTRASOUND;  Surgeon: Garner Nash, DO;  Location: [facility];  Service: Pulmonary;;   Social History   Socioeconomic History  . Marital status: Divorced    Spouse name: Not on file  . Number of children: 0  .  Years of education: 88  . Highest education level: High school graduate  Occupational History  . Occupation: Electronic record    Comment: [employer]  Tobacco Use  . Smoking status: Former Smoker    Packs/day: 3.00    Years: 50.00    Pack years: 150.00    Types: Cigarettes    Quit date: 05/10/2015    Years since quitting: 5.0  . Smokeless tobacco: Never Used  . Tobacco comment: Counseling sheet [phone]   Vaping Use  . Vaping Use: Never used  Substance and Sexual Activity  . Alcohol use: No    Alcohol/week: 0.0 standard drinks  . Drug use: No  . Sexual activity: Never    Birth control/protection: Post-menopausal  Other Topics Concern  . Not on file  Social History Narrative   Financial assistance approved for 100% discount at [facility] and has [facility] card per Bonna Gains   01/15/2010      Diet- N/A   Caffeine- Goody powders   Married-Divorced   [employer] and lives with mother   Pets-2   Current/past profession-[employer]   Exercise-No   Living will-No   DNR-No   POA/HPOA-No         Social Determinants of Health   Financial Resource Strain: Not on file  Food Insecurity: Not on file  Transportation Needs: No Transportation Needs  . Lack  of Transportation (Medical): No  . Lack of Transportation (Non-Medical): No  Physical Activity: Not on file  Stress: Not on file  Social Connections: Not on file  Intimate Partner Violence: Not on file   Family History  Problem Relation Age of Onset  . COPD Mother   . Heart disease Father 71       had 5 heart attacks, first in late 11s  . COPD Father   . Colon cancer Neg Hx   . Esophageal cancer Neg Hx   . Inflammatory bowel disease Neg Hx   . Liver disease Neg Hx   . Pancreatic cancer Neg Hx   . Rectal cancer Neg Hx   . Stomach cancer Neg Hx    I have reviewed her medical, social, and family history in detail and updated the electronic medical record as necessary.    PHYSICAL EXAMINATION  BP (!) 150/100 (BP Location: Left Arm, Patient Position: Sitting)   Pulse 64   Ht $R'5\' 3"'Qs$  (1.6 m)   Wt 182 lb (82.6 kg)   LMP 02/27/2014   SpO2 97%   BMI 32.24 kg/m  Wt Readings from Last 3 Encounters:  05/29/20 182 lb (82.6 kg)  04/24/20 186 lb 4 oz (84.5 kg)  03/15/20 185 lb (83.9 kg)  GEN: NAD, appears stated age, doesn't appear chronically ill PSYCH: Cooperative, without pressured speech EYE: Conjunctivae pink, sclerae anicteric ENT: Masked CV: Nontachycardic RESP: Mild wheezing appreciated on auscultation bilaterally GI: NABS, soft, protuberant abdomen, nontender, without rebound or guarding MSK/EXT: Lower extremity edema present SKIN: No jaundice NEURO:  Alert & Oriented x 3, no focal deficits   REVIEW OF DATA  I reviewed the following data at the time of this encounter:  GI Procedures and Studies  11/21 Colonoscopy - Hemorrhoids found on perianal exam. - Diverticulosis in the sigmoid colon. - One 8 mm polyp at the ileocecal valve, removed with a hot snare. Resected and retrieved. - One 5 mm polyp in the ascending colon, removed with a cold snare. Resected and retrieved. - One 6 mm polyp in the transverse colon, removed with a hot snare. Resected  and retrieved. -  The distal rectum and anal verge are normal on retroflexion view. - The examination was otherwise normal.  11/21 EGD - Esophageal mucosal changes suspicious for short-segment Barrett's esophagus, C0M3. Nodularity and ulceration noted in distal-most margin. Biopsied. - A medium amount of food (residue) in the stomach. - Retained food in the duodenum. - Acquired duodenal stenosis.  Pathology FINAL MICROSCOPIC DIAGNOSIS:  A. ESOPHAGUS, BARRETTS, BIOPSY:  - High-grade dysplasia arising in a background of Barrett's esophagus  - Focally worrisome for superficial invasion  - See comment  B. COLON, TRANSVERSE, ASCENDING, ILEOCECAL VALVE, POLYPECTOMY:  - Tubular adenoma(s)  - Negative for high-grade dysplasia or malignancy  Laboratory Studies  Reviewed those in EPIC  Imaging Studies  1/22 CT-Chest FINDINGS: Cardiovascular: The heart size is normal. No substantial pericardial effusion. Coronary artery calcification is evident. Atherosclerotic calcification is noted in the wall of the thoracic aorta. Mediastinum/Nodes: No mediastinal lymphadenopathy. No evidence for gross hilar lymphadenopathy although assessment is limited by the lack of intravenous contrast on today's study. The esophagus has normal imaging features. There is no axillary lymphadenopathy. Lungs/Pleura: Centrilobular emphysema noted. Left lower lobe pulmonary lesion measured previously at 4.1 x 3.2 cm is now 3.5 x 2.3 cm. The tiny discrete left lower lobe nodule measured previously at 4 mm has nearly resolved in the interval (image 71/3). No new suspicious pulmonary nodule or mass. Subsegmental atelectasis or linear scarring again noted in the lower lungs. No pleural effusion. Upper Abdomen: Stable low-attenuation nodular thickening left adrenal gland, likely related to adenoma. 16 mm exophytic lesion upper pole right kidney approaches water density, likely adenoma. Musculoskeletal: No worrisome lytic or sclerotic  osseous abnormality.  IMPRESSION: 1. Interval decrease in size of the left lower lobe pulmonary lesion, now measuring 3.5 x 2.3 cm. No new or progressive interval findings. 2. Stable low-attenuation nodular thickening left adrenal gland, likely related to adenoma. 3. Aortic Atherosclerosis (ICD10-I70.0) and Emphysema (ICD10-J43.9).   ASSESSMENT  Ms. Pitz is a 65 y.o. female with a pmh significant for COPD, diabetes, prior stroke, hypertension, hyperlipidemia, hypothyroidism, sleep apnea, anxiety, status post appendectomy, status post hysterectomy, status post cholecystectomy, PUD (manifested previously as ulcer disease and now duodenal stricturing), GERD, Barrett's esophagus (high-grade dysplasia at least), hemorrhoids, colon polyps.  The patient is seen today for evaluation and management of:  1. Barrett's esophagus with dysplasia   2. Abnormal findings on esophagogastroduodenoscopy (EGD)   3. Dysphagia, unspecified type   4. Duodenal stricture   5. Gastroesophageal reflux disease with esophagitis without hemorrhage   6. History of peptic ulcer disease    The patient is hemodynamically stable.  Clinically she has a history of longstanding heartburn symptoms with known Barrett's esophagus that looks to have progressed to high-grade dysplasia at a minimum.  There is an area/focus in the distal esophagus/GE junction region that was concerning for ulceration that may be a result of progressive Barrett's esophagus and invasion.  Additional work-up and evaluation is required at this time.  She remains on twice daily PPI therapy with the hope that this will prevent further progression and maybe allow regression of some of those changes that were noted previously 3 months ago.  She had been referred to [facility] for consideration of endoscopic ablative therapy if possible but she is not able to make it there.  I am happy to be able to offer her potential therapies here.    I had an  extensive discussion with the patient and wife regarding the overall  management of Barrett's esophagus (BE).  I reviewed the epidemiology, pathophysiology and clinical expectations of Barrett's and dysplasia.  We had a detailed discussion of BE and how it is a condition in which intestinal metaplasia (IM) replaces the normal squamous mucosa of the esophagus (typically in the lower part and at the GE junction). Replacement is believed to be secondary to acid reflux injury that occurs in the 2nd, 3rd and 4th decades of life. The condition of BE is noticed during endoscopy and confirmed by histopathology. We discussed that the length of the intestinal metaplasia is thought to remain static, however, the histopathology of the BE to cancer sequence is believed to progress along a continuum: non-dysplastic to low grade dysplasia (LGD) to high grade dysplasia (HGD) to intramucosal or early mucosal carcinoma [facility]) to advanced invasive carcinoma (AIC).  It is important to recognize that HGD and EMC may be indistinguishable histologically and the two often coexist. It is also important to recognize that HGD may never progress to cancer and may even regress.  With that being said in her particular case, there is already concern for potential invasion.  We discussed the options for management which include surgical resection as well as endoluminal therapies including endoscopic mucosal resection, radiofrequency ablation and cryotherapy.  We discussed the risks and benefits of each. Treatment is always very individualized.  Age, co-morbidities, aversion to cancer risk, local surgical and endoscopic expertise in the area, are all considered when making a plan of care.  If there is no evidence of invasion, then the patient's endoscopic treatment options include radiofrequency ablation and/or endoscopic mucosal resection and/or cryotherapy.  I informed the patient that endoscopic therapy typically requires an average of three  treatment sessions though frequently more sessions are required for complete eradication of all intestinal metaplasia.  These sessions will be schedule every 3-4 months until all areas are effectively treated.  The goal of endoscopic therapy is complete eradication of dysplasia and intestinal metaplasia.  It is known that approximately 1 out of every 10 patients may not respond completely to endoscopic ablation therapy and all Barrett's tissue may not be able to be fully eradicated.  I reinforced that the patient must accept life-long endoscopic surveillance and acid suppression therapy as recurrence rates of BE may be as high as 30%.  Surveillance of previously treated BE involves repeating the endoscopy every 3 months for the first year following complete eradication followed by endoscopy every 6 months for the second year, and then routinely thereafter.   Although every effort will be made to treat all of the Barrett's esophagus endoscopically, I reinforced that endoscopic eradication therapy does not prevent surgery down the line. Furthermore, endoscopic therapy does not completely eliminate the risk of developing esophageal cancer. I also discussed the potential complications of endoluminal therapy which includes, but is not limited to: perforation, bleeding, and stricture formation.  I also reinforced that it is common to experience pain following radiofrequency ablation and this would be managed appropriately with pain medications post-procedure.  We will be prepared for him with an endoscopic ultrasound to evaluate the area and proceed with endoscopic resection of that area if necessary otherwise if no evidence of ulceration is noted we may begin RFA therapy.  The risks of an EUS including intestinal perforation, bleeding, infection, aspiration, and medication effects were discussed as was the possibility it may not give a definitive diagnosis if a biopsy is performed.  When a biopsy of the pancreas is done  as part of  the EUS, there is an additional risk of pancreatitis at the rate of about 1-2%.  It was explained that procedure related pancreatitis is typically mild, although it can be severe and even life threatening, which is why we do not perform random pancreatic biopsies and only biopsy a lesion/area we feel is concerning enough to warrant the risk.  The risks and benefits of endoscopic evaluation were discussed with the patient; these include but are not limited to the risk of perforation, infection, bleeding, missed lesions, lack of diagnosis, severe illness requiring hospitalization, as well as anesthesia and sedation related illnesses.  The patient is agreeable to proceed.   Due to the patient's peptic ulcer disease and peptic stricturing that had been noted on last endoscopy I would like to obtain a upper GI series prior to endoscopic ultrasound to get a better sense of the passage of contrast through the stomach into the small bowel and understand the potential issues of stricturing that are present.  Is possible we may need to get cross-sectional imaging but will begin with upper GI series 1st.  Preprocedure labs will be obtained as well.  All patient questions were answered to the best of my ability, and the patient agrees to the aforementioned plan of action with follow-up as indicated.   PLAN  Continue 40 mg twice daily PPI Proceed with upper GI series/small bowel follow-through to evaluate duodenal region in regards to stricturing that had been noted on recent endoscopy and history of prior PUD Preprocedure labs to be obtained as outlined below Proceed with scheduling EGD/EUS with EMR versus RFA depending on findings for Barrett's esophagus high-grade dysplasia possible invasion Additional cross-sectional imaging may be considered depending on findings after upper GI series Follow-up in interim with Dr. Paulita Fujita for any other GI issues for now   Orders Placed This Encounter  Procedures   . Procedural/ Surgical Case Request: UPPER ESOPHAGEAL ENDOSCOPIC ULTRASOUND (EUS), ENDOSCOPIC MUCOSAL RESECTION, GI RADIOFREQUENCY ABLATION  . DG UGI W SINGLE CM (SOL OR THIN BA)  . DG UGI W SMALL BOWEL  . CBC  . Basic Metabolic Panel (BMET)  . INR/PT  . Ambulatory referral to Gastroenterology    New Prescriptions   No medications on file   Modified Medications   No medications on file    Planned Follow Up No follow-ups on file.   Total Time in Face-to-Face and in Coordination of Care for patient including independent/personal interpretation/review of prior testing, medical history, examination, medication adjustment, communicating results with the patient directly, and documentation with the EHR is 45 [email or web address]   Justice Britain, MD [facility] # [phone]

## 2020-05-29 NOTE — H&P (View-Only) (Signed)
[place] VISIT   Primary Care Provider Andree Moro, [place] [address] [phone]  Referring Provider Arta Silence, MD [phone] [address] [place],  [place] [address] [phone]  Patient Profile: Grace [name] is a 65 y.o. female with a pmh significant for COPD, diabetes, prior stroke, hypertension, hyperlipidemia, hypothyroidism, sleep apnea, anxiety, status post appendectomy, status post hysterectomy, status post cholecystectomy, PUD (manifested previously as ulcer disease and now duodenal stricturing), GERD, Barrett's esophagus (high-grade dysplasia at least), hemorrhoids, colon polyps.  The patient presents to the [facility] for an evaluation and management of problem(s) noted below:  Problem List 1. Barrett's esophagus with dysplasia   2. Abnormal findings on esophagogastroduodenoscopy (EGD)   3. Dysphagia, unspecified type   4. Duodenal stricture   5. Gastroesophageal reflux disease with esophagitis without hemorrhage   6. History of peptic ulcer disease     History of Present Illness This is the patient's 1st visit to the outpatient [place] clinic in years.  She had previously seen Dr. Carlean Purl up until 2013.  She had upper and lower endoscopies.  Patient eventually was lost to follow-up.  She followed up thereafter with Dr. Paulita Fujita of [facility].Marland Kitchen  The patient had a history of known Barrett's esophagus and underwent repeat endoscopic evaluation in 2021.  Endoscopic evaluation showed evidence of Barrett's esophagus with high-grade dysplasia and an ulceration suggestive of potential invasion although overt adenocarcinoma was not described.  Patient was referred to [facility] for consideration of further endoscopic therapies.  She had a telemedicine visit with Dr. Newman Pies who had suggested an EGD EUS with possible ablative therapies thereafter depending on findings at time of endoscopic  ultrasound.  Patient is not able to follow-up at [facility] due to transportation issues.  As such she reached out to Dr. Paulita Fujita and Dr. Paulita Fujita subsequently reach out to me to consider if further therapies can be performed here in [place].  Patient states that she has had longstanding acid reflux but symptoms are relatively stable on her PPI therapy.  She deals with belching on a frequent basis.  She has some chest discomfort that occurs at times which she attributes to her acid reflux.  She has had longstanding issues with dysphagia.  She has no family history of GI malignancies.  The patient does not take significant nonsteroidals or BC/Goody powders.  GI Review of Systems Positive as above Negative for odynophagia, nausea, vomiting, pain, change in bowel habits, melena, hematochezia  Review of Systems General: Denies fevers/chills/weight loss unintentionally HEENT: Denies oral lesions Cardiovascular: Denies current chest pain/palpitations Pulmonary: Denies shortness of breath/nocturnal cough Gastroenterological: See HPI Genitourinary: Denies darkened urine Hematological: Denies easy bruising/bleeding Dermatological: Denies jaundice Psychological: Mood is anxious about undergoing further procedures.   Medications Current Outpatient Medications  Medication Sig Dispense Refill  . albuterol (PROAIR HFA) 108 (90 Base) MCG/ACT inhaler INHALE 2 PUFFS EVERY 6  HOURS AS NEEDED FOR  SHORTNESS OF BREATH OR  WHEEZING (Patient taking differently: Inhale 2 puffs into the lungs every 6 (six) hours as needed for wheezing.) 34 g 3  . atorvastatin (LIPITOR) 80 MG tablet TAKE 1 TABLET BY MOUTH ONCE DAILY AT BEDTIME FOR  CHOLESTEROL (Patient taking differently: Take 80 mg by mouth at bedtime.) 90 tablet 3  . Biotin 5000 MCG TABS Take 5,000 mcg by mouth in the morning and at bedtime.     . bisoprolol (ZEBETA) 10 MG tablet Take 10 mg by mouth  daily.    Marland Kitchen CINNAMON PO Take by mouth. 1500 mg twice a  day    . estradiol (ESTRACE VAGINAL) 0.1 MG/GM vaginal cream Place 1 Applicatorful vaginally at bedtime. (Patient taking differently: Place 1 Applicatorful vaginally 2 (two) times a week.) 42.5 g 12  . Fluticasone-Umeclidin-Vilant (TRELEGY ELLIPTA) 100-62.5-25 MCG/INH AEPB Inhale 1 puff into the lungs daily. 60 each 5  . glipiZIDE (GLUCOTROL XL) 5 MG 24 hr tablet TAKE 1 TABLET BY MOUTH  DAILY WITH BREAKFAST (Patient taking differently: Take 5 mg by mouth every evening.) 90 tablet 1  . glucose blood (ONETOUCH VERIO) test strip Use to check blood sugar twice daily. Dx: E11.9 300 each 1  . hydrALAZINE (APRESOLINE) 50 MG tablet TAKE 1 TABLET BY MOUTH 3  TIMES DAILY (Patient taking differently: Take 50 mg by mouth 3 (three) times daily.) 270 tablet 3  . ipratropium-albuterol (DUONEB) 0.5-2.5 (3) MG/3ML SOLN Take 3 mLs by nebulization every 6 (six) hours as needed. (Patient taking differently: Take 3 mLs by nebulization every 6 (six) hours as needed (wheezing/shortness of breath.).) 360 mL 6  . Lancets (ONETOUCH DELICA PLUS HYWVPX10G) MISC USE TO CHECK BLOOD SUGAR  TWICE DAILY 200 each 3  . levothyroxine (SYNTHROID) 137 MCG tablet TAKE 1 TABLET BY MOUTH  DAILY BEFORE BREAKFAST (Patient taking differently: Take 137 mcg by mouth daily before breakfast.) 90 tablet 2  . LORazepam (ATIVAN) 0.5 MG tablet Take 0.5 mg by mouth 3 (three) times daily as needed for anxiety.    Marland Kitchen losartan (COZAAR) 100 MG tablet TAKE 1 TABLET BY MOUTH  DAILY (Patient taking differently: Take 100 mg by mouth every evening.) 90 tablet 3  . Magnesium 250 MG TABS Take by mouth 2 (two) times daily.    Marland Kitchen omeprazole (PRILOSEC) 20 MG capsule Take 30- 60 min before your first and last meals of the day (Patient taking differently: Take 20 mg by mouth 2 (two) times daily before a meal. Take 30- 60 min before your first and last meals of the day) 180 capsule 3  . Respiratory Therapy Supplies (NEBULIZER/TUBING/MOUTHPIECE) KIT 1 application by Does not  apply route every 6 (six) hours as needed. FAX TO [phone] 1 kit 11  . Tiotropium Bromide Monohydrate (SPIRIVA RESPIMAT) 2.5 MCG/ACT AERS USE 2 INHALATIONS BY MOUTH  ONCE DAILY (Patient taking differently: Inhale 2 puffs into the lungs daily.) 12 g 3  . valACYclovir (VALTREX) 500 MG tablet TAKE 1 TABLET BY MOUTH  DAILY (Patient taking differently: Take 500 mg by mouth daily.) 90 tablet 3   No current facility-administered medications for this visit.    Allergies Allergies  Allergen Reactions  . Amlodipine     Cramps   . Codeine     REACTION: headaches  . Gabapentin     headache  . Prozac [Fluoxetine Hcl] Other (See Comments)    Suicidal ideations    Histories Past Medical History:  Diagnosis Date  . Abnormal vaginal bleeding   . Anxiety   . Asthma   . Barrett's esophagus    with high grade dysplasia per endoscopy 05/2005 // followed by Dr. [name] ([facility])  . Chronic back pain   . COPD (chronic obstructive pulmonary disease) ([place])   . Diabetes mellitus without complication ([place])    type 2  . Duodenal ulcer disease    thought to be contributed at least partly by overuse of NSAIDs and salicylates (goody powdr)  . Endometrial polyp 03/2003   s/p resection in 03/2003. Path  showing submucosal leiomyoma and benign  proliferative type endometrium  . Family history of premature CAD 05/19/2018  . Fibroid uterus    s/p myomectomy x 2  . GERD (gastroesophageal reflux disease)   . H/O failed moderate sedation   . Hiatal hernia   . History of CVA (cerebrovascular accident) 10/2000  . 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 Hyperlipidemia   . Hypertension   . Hypothyroidism    with hx of multinodular goiter (noted on neck US in 11/2002 - showing diffuse nodularity and inhomogenous texture diffusely BL)  . Internal hemorrhoids    grade 2 per colonoscopy in 09/2006 - repeat colonoscopy  rec in 5-10 years.  . Migraine   . Nevus   . Personal history of failed conscious sedation 08/11/2011  . Sleep apnea    does not use cpap  . Stroke ([place]) 10/2000  . Tachycardia   . Thyroid disease   . Tobacco abuse Jan 2014   quit   Past Surgical History:  Procedure Laterality Date  . APPENDECTOMY  2017  . BIOPSY  02/15/2020   Procedure: BIOPSY;  Surgeon: Arta Silence, MD;  Location: [facility];  Service: Endoscopy;;  . BRONCHIAL BIOPSY  03/15/2020   Procedure: BRONCHIAL BIOPSIES;  Surgeon: Garner Nash, DO;  Location: [facility];  Service: Pulmonary;;  . BRONCHIAL BRUSHINGS  03/15/2020   Procedure: BRONCHIAL BRUSHINGS;  Surgeon: Garner Nash, DO;  Location: [facility];  Service: Pulmonary;;  . BRONCHIAL NEEDLE ASPIRATION BIOPSY  03/15/2020   Procedure: BRONCHIAL NEEDLE ASPIRATION BIOPSIES;  Surgeon: Garner Nash, DO;  Location: [facility];  Service: Pulmonary;;  . BRONCHIAL WASHINGS  03/15/2020   Procedure: BRONCHIAL WASHINGS;  Surgeon: Garner Nash, DO;  Location: [facility];  Service: Pulmonary;;  . CHOLECYSTECTOMY    . COLONOSCOPY  02/07/2010  . COLONOSCOPY WITH PROPOFOL N/A 02/15/2020   Procedure: COLONOSCOPY WITH PROPOFOL;  Surgeon: Arta Silence, MD;  Location: [facility];  Service: Endoscopy;  Laterality: N/A;  . DILATION AND CURETTAGE OF UTERUS    . ESOPHAGOGASTRODUODENOSCOPY (EGD) WITH PROPOFOL N/A 02/15/2020   Procedure: ESOPHAGOGASTRODUODENOSCOPY (EGD) WITH PROPOFOL;  Surgeon: Arta Silence, MD;  Location: [facility];  Service: Endoscopy;  Laterality: N/A;  . HYSTEROSCOPY    . LAPAROSCOPIC APPENDECTOMY N/A 09/23/2015   Procedure: APPENDECTOMY LAPAROSCOPIC;  Surgeon: Georganna Skeans, MD;  Location: [facility];  Service: General;  Laterality: N/A;  . [place]   x 2 - In 1997, noted to have extensive pelvic adhesions and BL tubal obstruction  . POLYPECTOMY  02/15/2020   Procedure: POLYPECTOMY;  Surgeon: Arta Silence, MD;  Location:  [facility];  Service: Endoscopy;;  . TRANSTHORACIC ECHOCARDIOGRAM  06/2014    EF 55-60%.  No RWMA. Gr 1 DD. Cannot R/o PFO (consider bubble study).  Marland Kitchen UPPER GASTROINTESTINAL ENDOSCOPY  12/18/2009  . VIDEO BRONCHOSCOPY WITH ENDOBRONCHIAL NAVIGATION N/A 03/15/2020   Procedure: VIDEO BRONCHOSCOPY WITH ENDOBRONCHIAL NAVIGATION;  Surgeon: Garner Nash, DO;  Location: [facility];  Service: Pulmonary;  Laterality: N/A;  . VIDEO BRONCHOSCOPY WITH ENDOBRONCHIAL ULTRASOUND  03/15/2020   Procedure: VIDEO BRONCHOSCOPY WITH ENDOBRONCHIAL ULTRASOUND;  Surgeon: Garner Nash, DO;  Location: [facility];  Service: Pulmonary;;   Social History   Socioeconomic History  . Marital status: Divorced    Spouse name: Not on file  . Number of children: 0  .  Years of education: 46  . Highest education level: High school graduate  Occupational History  . Occupation: Electronic record    Comment: [employer]  Tobacco Use  . Smoking status: Former Smoker    Packs/day: 3.00    Years: 50.00    Pack years: 150.00    Types: Cigarettes    Quit date: 05/10/2015    Years since quitting: 5.0  . Smokeless tobacco: Never Used  . Tobacco comment: Counseling sheet [id number]   Vaping Use  . Vaping Use: Never used  Substance and Sexual Activity  . Alcohol use: No    Alcohol/week: 0.0 standard drinks  . Drug use: No  . Sexual activity: Never    Birth control/protection: Post-menopausal  Other Topics Concern  . Not on file  Social History Narrative   Financial assistance approved for 100% discount at [facility] and has [facility] card per Bonna Gains   01/15/2010      Diet- N/A   Caffeine- Goody powders   Married-Divorced   [employer] and lives with mother   Pets-2   Current/past profession-[employer]   Exercise-No   Living will-No   DNR-No   POA/HPOA-No         Social Determinants of Health   Financial Resource Strain: Not on file  Food Insecurity: Not on file  Transportation Needs: No Transportation Needs  . Lack  of Transportation (Medical): No  . Lack of Transportation (Non-Medical): No  Physical Activity: Not on file  Stress: Not on file  Social Connections: Not on file  Intimate Partner Violence: Not on file   Family History  Problem Relation Age of Onset  . COPD Mother   . Heart disease Father 48       had 5 heart attacks, first in late 25s  . COPD Father   . Colon cancer Neg Hx   . Esophageal cancer Neg Hx   . Inflammatory bowel disease Neg Hx   . Liver disease Neg Hx   . Pancreatic cancer Neg Hx   . Rectal cancer Neg Hx   . Stomach cancer Neg Hx    I have reviewed her medical, social, and family history in detail and updated the electronic medical record as necessary.    PHYSICAL EXAMINATION  BP (!) 150/100 (BP Location: Left Arm, Patient Position: Sitting)   Pulse 64   Ht $R'5\' 3"'tm$  (1.6 m)   Wt 182 lb (82.6 kg)   LMP 02/27/2014   SpO2 97%   BMI 32.24 kg/m  Wt Readings from Last 3 Encounters:  05/29/20 182 lb (82.6 kg)  04/24/20 186 lb 4 oz (84.5 kg)  03/15/20 185 lb (83.9 kg)  GEN: NAD, appears stated age, doesn't appear chronically ill PSYCH: Cooperative, without pressured speech EYE: Conjunctivae pink, sclerae anicteric ENT: Masked CV: Nontachycardic RESP: Mild wheezing appreciated on auscultation bilaterally GI: NABS, soft, protuberant abdomen, nontender, without rebound or guarding MSK/EXT: Lower extremity edema present SKIN: No jaundice NEURO:  Alert & Oriented x 3, no focal deficits   REVIEW OF DATA  I reviewed the following data at the time of this encounter:  GI Procedures and Studies  11/21 Colonoscopy - Hemorrhoids found on perianal exam. - Diverticulosis in the sigmoid colon. - One 8 mm polyp at the ileocecal valve, removed with a hot snare. Resected and retrieved. - One 5 mm polyp in the ascending colon, removed with a cold snare. Resected and retrieved. - One 6 mm polyp in the transverse colon, removed with a hot snare. Resected  and retrieved. -  The distal rectum and anal verge are normal on retroflexion view. - The examination was otherwise normal.  11/21 EGD - Esophageal mucosal changes suspicious for short-segment Barrett's esophagus, C0M3. Nodularity and ulceration noted in distal-most margin. Biopsied. - A medium amount of food (residue) in the stomach. - Retained food in the duodenum. - Acquired duodenal stenosis.  Pathology FINAL MICROSCOPIC DIAGNOSIS:  A. ESOPHAGUS, BARRETTS, BIOPSY:  - High-grade dysplasia arising in a background of Barrett's esophagus  - Focally worrisome for superficial invasion  - See comment  B. COLON, TRANSVERSE, ASCENDING, ILEOCECAL VALVE, POLYPECTOMY:  - Tubular adenoma(s)  - Negative for high-grade dysplasia or malignancy  Laboratory Studies  Reviewed those in EPIC  Imaging Studies  1/22 CT-Chest FINDINGS: Cardiovascular: The heart size is normal. No substantial pericardial effusion. Coronary artery calcification is evident. Atherosclerotic calcification is noted in the wall of the thoracic aorta. Mediastinum/Nodes: No mediastinal lymphadenopathy. No evidence for gross hilar lymphadenopathy although assessment is limited by the lack of intravenous contrast on today's study. The esophagus has normal imaging features. There is no axillary lymphadenopathy. Lungs/Pleura: Centrilobular emphysema noted. Left lower lobe pulmonary lesion measured previously at 4.1 x 3.2 cm is now 3.5 x 2.3 cm. The tiny discrete left lower lobe nodule measured previously at 4 mm has nearly resolved in the interval (image 71/3). No new suspicious pulmonary nodule or mass. Subsegmental atelectasis or linear scarring again noted in the lower lungs. No pleural effusion. Upper Abdomen: Stable low-attenuation nodular thickening left adrenal gland, likely related to adenoma. 16 mm exophytic lesion upper pole right kidney approaches water density, likely adenoma. Musculoskeletal: No worrisome lytic or sclerotic  osseous abnormality.  IMPRESSION: 1. Interval decrease in size of the left lower lobe pulmonary lesion, now measuring 3.5 x 2.3 cm. No new or progressive interval findings. 2. Stable low-attenuation nodular thickening left adrenal gland, likely related to adenoma. 3. Aortic Atherosclerosis (ICD10-I70.0) and Emphysema (ICD10-J43.9).   ASSESSMENT  Ms. Douthitt is a 65 y.o. female with a pmh significant for COPD, diabetes, prior stroke, hypertension, hyperlipidemia, hypothyroidism, sleep apnea, anxiety, status post appendectomy, status post hysterectomy, status post cholecystectomy, PUD (manifested previously as ulcer disease and now duodenal stricturing), GERD, Barrett's esophagus (high-grade dysplasia at least), hemorrhoids, colon polyps.  The patient is seen today for evaluation and management of:  1. Barrett's esophagus with dysplasia   2. Abnormal findings on esophagogastroduodenoscopy (EGD)   3. Dysphagia, unspecified type   4. Duodenal stricture   5. Gastroesophageal reflux disease with esophagitis without hemorrhage   6. History of peptic ulcer disease    The patient is hemodynamically stable.  Clinically she has a history of longstanding heartburn symptoms with known Barrett's esophagus that looks to have progressed to high-grade dysplasia at a minimum.  There is an area/focus in the distal esophagus/GE junction region that was concerning for ulceration that may be a result of progressive Barrett's esophagus and invasion.  Additional work-up and evaluation is required at this time.  She remains on twice daily PPI therapy with the hope that this will prevent further progression and maybe allow regression of some of those changes that were noted previously 3 months ago.  She had been referred to [facility] for consideration of endoscopic ablative therapy if possible but she is not able to make it there.  I am happy to be able to offer her potential therapies here.    I had an  extensive discussion with the patient and wife regarding the overall  management of Barrett's esophagus (BE).  I reviewed the epidemiology, pathophysiology and clinical expectations of Barrett's and dysplasia.  We had a detailed discussion of BE and how it is a condition in which intestinal metaplasia (IM) replaces the normal squamous mucosa of the esophagus (typically in the lower part and at the GE junction). Replacement is believed to be secondary to acid reflux injury that occurs in the 2nd, 3rd and 4th decades of life. The condition of BE is noticed during endoscopy and confirmed by histopathology. We discussed that the length of the intestinal metaplasia is thought to remain static, however, the histopathology of the BE to cancer sequence is believed to progress along a continuum: non-dysplastic to low grade dysplasia (LGD) to high grade dysplasia (HGD) to intramucosal or early mucosal carcinoma [facility]) to advanced invasive carcinoma (AIC).  It is important to recognize that HGD and EMC may be indistinguishable histologically and the two often coexist. It is also important to recognize that HGD may never progress to cancer and may even regress.  With that being said in her particular case, there is already concern for potential invasion.  We discussed the options for management which include surgical resection as well as endoluminal therapies including endoscopic mucosal resection, radiofrequency ablation and cryotherapy.  We discussed the risks and benefits of each. Treatment is always very individualized.  Age, co-morbidities, aversion to cancer risk, local surgical and endoscopic expertise in the area, are all considered when making a plan of care.  If there is no evidence of invasion, then the patient's endoscopic treatment options include radiofrequency ablation and/or endoscopic mucosal resection and/or cryotherapy.  I informed the patient that endoscopic therapy typically requires an average of three  treatment sessions though frequently more sessions are required for complete eradication of all intestinal metaplasia.  These sessions will be schedule every 3-4 months until all areas are effectively treated.  The goal of endoscopic therapy is complete eradication of dysplasia and intestinal metaplasia.  It is known that approximately 1 out of every 10 patients may not respond completely to endoscopic ablation therapy and all Barrett's tissue may not be able to be fully eradicated.  I reinforced that the patient must accept life-long endoscopic surveillance and acid suppression therapy as recurrence rates of BE may be as high as 30%.  Surveillance of previously treated BE involves repeating the endoscopy every 3 months for the first year following complete eradication followed by endoscopy every 6 months for the second year, and then routinely thereafter.   Although every effort will be made to treat all of the Barrett's esophagus endoscopically, I reinforced that endoscopic eradication therapy does not prevent surgery down the line. Furthermore, endoscopic therapy does not completely eliminate the risk of developing esophageal cancer. I also discussed the potential complications of endoluminal therapy which includes, but is not limited to: perforation, bleeding, and stricture formation.  I also reinforced that it is common to experience pain following radiofrequency ablation and this would be managed appropriately with pain medications post-procedure.  We will be prepared for him with an endoscopic ultrasound to evaluate the area and proceed with endoscopic resection of that area if necessary otherwise if no evidence of ulceration is noted we may begin RFA therapy.  The risks of an EUS including intestinal perforation, bleeding, infection, aspiration, and medication effects were discussed as was the possibility it may not give a definitive diagnosis if a biopsy is performed.  When a biopsy of the pancreas is done  as part of  the EUS, there is an additional risk of pancreatitis at the rate of about 1-2%.  It was explained that procedure related pancreatitis is typically mild, although it can be severe and even life threatening, which is why we do not perform random pancreatic biopsies and only biopsy a lesion/area we feel is concerning enough to warrant the risk.  The risks and benefits of endoscopic evaluation were discussed with the patient; these include but are not limited to the risk of perforation, infection, bleeding, missed lesions, lack of diagnosis, severe illness requiring hospitalization, as well as anesthesia and sedation related illnesses.  The patient is agreeable to proceed.   Due to the patient's peptic ulcer disease and peptic stricturing that had been noted on last endoscopy I would like to obtain a upper GI series prior to endoscopic ultrasound to get a better sense of the passage of contrast through the stomach into the small bowel and understand the potential issues of stricturing that are present.  Is possible we may need to get cross-sectional imaging but will begin with upper GI series 1st.  Preprocedure labs will be obtained as well.  All patient questions were answered to the best of my ability, and the patient agrees to the aforementioned plan of action with follow-up as indicated.   PLAN  Continue 40 mg twice daily PPI Proceed with upper GI series/small bowel follow-through to evaluate duodenal region in regards to stricturing that had been noted on recent endoscopy and history of prior PUD Preprocedure labs to be obtained as outlined below Proceed with scheduling EGD/EUS with EMR versus RFA depending on findings for Barrett's esophagus high-grade dysplasia possible invasion Additional cross-sectional imaging may be considered depending on findings after upper GI series Follow-up in interim with Dr. Paulita Fujita for any other GI issues for now   Orders Placed This Encounter  Procedures   . Procedural/ Surgical Case Request: UPPER ESOPHAGEAL ENDOSCOPIC ULTRASOUND (EUS), ENDOSCOPIC MUCOSAL RESECTION, GI RADIOFREQUENCY ABLATION  . DG UGI W SINGLE CM (SOL OR THIN BA)  . DG UGI W SMALL BOWEL  . CBC  . Basic Metabolic Panel (BMET)  . INR/PT  . Ambulatory referral to Gastroenterology    New Prescriptions   No medications on file   Modified Medications   No medications on file    Planned Follow Up No follow-ups on file.   Total Time in Face-to-Face and in Coordination of Care for patient including independent/personal interpretation/review of prior testing, medical history, examination, medication adjustment, communicating results with the patient directly, and documentation with the EHR is 45 minutes.   [facility] [place], MD [facility] Office # [phone]

## 2020-05-30 ENCOUNTER — Encounter: Payer: Self-pay | Admitting: Gastroenterology

## 2020-06-01 DIAGNOSIS — K2100 Gastro-esophageal reflux disease with esophagitis, without bleeding: Secondary | ICD-10-CM | POA: Insufficient documentation

## 2020-06-01 DIAGNOSIS — R1310 Dysphagia, unspecified: Secondary | ICD-10-CM | POA: Insufficient documentation

## 2020-06-01 DIAGNOSIS — Z8711 Personal history of peptic ulcer disease: Secondary | ICD-10-CM | POA: Insufficient documentation

## 2020-06-01 DIAGNOSIS — R198 Other specified symptoms and signs involving the digestive system and abdomen: Secondary | ICD-10-CM | POA: Insufficient documentation

## 2020-06-01 DIAGNOSIS — K315 Obstruction of duodenum: Secondary | ICD-10-CM | POA: Insufficient documentation

## 2020-06-06 ENCOUNTER — Other Ambulatory Visit: Payer: Self-pay | Admitting: Gastroenterology

## 2020-06-06 ENCOUNTER — Ambulatory Visit (HOSPITAL_COMMUNITY)
Admission: RE | Admit: 2020-06-06 | Discharge: 2020-06-06 | Disposition: A | Discharge status: Home / Self Care | Payer: Medicare Other | Source: Ambulatory Visit | Attending: Gastroenterology | Admitting: Gastroenterology

## 2020-06-06 ENCOUNTER — Other Ambulatory Visit: Payer: Self-pay

## 2020-06-06 DIAGNOSIS — K315 Obstruction of duodenum: Secondary | ICD-10-CM

## 2020-06-06 DIAGNOSIS — K22719 Barrett's esophagus with dysplasia, unspecified: Secondary | ICD-10-CM | POA: Diagnosis present

## 2020-06-06 DIAGNOSIS — R1310 Dysphagia, unspecified: Secondary | ICD-10-CM

## 2020-06-06 DIAGNOSIS — R198 Other specified symptoms and signs involving the digestive system and abdomen: Secondary | ICD-10-CM | POA: Diagnosis present

## 2020-06-08 ENCOUNTER — Institutional Professional Consult (permissible substitution): Payer: Self-pay | Admitting: Pulmonary Disease

## 2020-06-11 ENCOUNTER — Other Ambulatory Visit (HOSPITAL_COMMUNITY)
Admission: RE | Admit: 2020-06-11 | Discharge: 2020-06-11 | Disposition: A | Discharge status: Home / Self Care | Payer: Medicare Other | Source: Ambulatory Visit | Attending: Gastroenterology | Admitting: Gastroenterology

## 2020-06-11 DIAGNOSIS — Z01812 Encounter for preprocedural laboratory examination: Secondary | ICD-10-CM | POA: Insufficient documentation

## 2020-06-11 DIAGNOSIS — Z20822 Contact with and (suspected) exposure to covid-19: Secondary | ICD-10-CM | POA: Diagnosis not present

## 2020-06-11 LAB — SARS CORONAVIRUS 2 (TAT 6-24 HRS): SARS Coronavirus 2: NEGATIVE

## 2020-06-12 ENCOUNTER — Other Ambulatory Visit: Payer: Self-pay | Admitting: Nurse Practitioner

## 2020-06-12 DIAGNOSIS — E785 Hyperlipidemia, unspecified: Secondary | ICD-10-CM

## 2020-06-12 DIAGNOSIS — E1169 Type 2 diabetes mellitus with other specified complication: Secondary | ICD-10-CM

## 2020-06-13 ENCOUNTER — Other Ambulatory Visit: Payer: Self-pay

## 2020-06-13 ENCOUNTER — Encounter (HOSPITAL_COMMUNITY): Payer: Self-pay | Admitting: Gastroenterology

## 2020-06-13 NOTE — Anesthesia Preprocedure Evaluation (Addendum)
Anesthesia Evaluation  Patient identified by MRN, date of birth, ID band Patient awake    Reviewed: Allergy & Precautions, NPO status , Patient's Chart, lab work & pertinent test results  Airway Mallampati: II  TM Distance: >3 FB Neck ROM: Full    Dental  (+) Poor Dentition   Pulmonary asthma , sleep apnea , COPD,  COPD inhaler, former smoker,    Pulmonary exam normal        Cardiovascular hypertension, Pt. on medications and Pt. on home beta blockers + CAD   Rhythm:Regular Rate:Normal     Neuro/Psych  Headaches, Anxiety CVA (07/02), No Residual Symptoms    GI/Hepatic Neg liver ROS, hiatal hernia, PUD, GERD  Medicated,Duodenal stricture, Barrett's   Endo/Other  diabetes, Type 2, Oral Hypoglycemic AgentsHypothyroidism   Renal/GU negative Renal ROS  negative genitourinary   Musculoskeletal negative musculoskeletal ROS (+)   Abdominal (+)  Abdomen: soft. Bowel sounds: normal.  Peds  Hematology negative hematology ROS (+)   Anesthesia Other Findings   Reproductive/Obstetrics                           Anesthesia Physical Anesthesia Plan  ASA: III  Anesthesia Plan: General   Post-op Pain Management:    Induction: Intravenous  PONV Risk Score and Plan: 3 and Ondansetron and Propofol infusion  Airway Management Planned: Mask and Oral ETT  Additional Equipment: None  Intra-op Plan:   Post-operative Plan: Extubation in OR  Informed Consent: I have reviewed the patients History and Physical, chart, labs and discussed the procedure including the risks, benefits and alternatives for the proposed anesthesia with the patient or authorized representative who has indicated his/her understanding and acceptance.     Dental advisory given  Plan Discussed with: CRNA  Anesthesia Plan Comments: (PAT note written 06/13/2020 by Myra Gianotti, PA-C. Lab Results      Component                 Value               Date                      WBC                      7.7                 05/29/2020                HGB                      13.4                05/29/2020                HCT                      40.7                05/29/2020                MCV                      92.1                05/29/2020                PLT  223.0               05/29/2020           Lab Results      Component                Value               Date                      NA                       138                 05/29/2020                K                        4.0                 05/29/2020                CO2                      28                  05/29/2020                GLUCOSE                  106 (H)             05/29/2020                BUN                      22                  05/29/2020                CREATININE               0.80                05/29/2020                CALCIUM                  9.4                 05/29/2020                GFRNONAA                 >60                 03/15/2020                GFRAA                    96                  09/28/2019          )     Anesthesia Quick Evaluation

## 2020-06-13 NOTE — Progress Notes (Signed)
Patient denies shortness of breath, fever, cough or chest pain.  PCP - Dr Posey Pronto @ [facility] Cardiologist - Dr Glenetta Hew Pulmonology - June Leap, DO  Chest x-ray - CT Chest 04/19/20 EKG - 03/12/20 Stress Test - 03/22/04 ECHO - 07/04/14 Cardiac Cath - n/a  Sleep Study -  Yes CPAP - Does not use cpap  Fasting Blood Sugar - 110s-120s Checks Blood Sugar 1 times a day  . Do not take oral diabetes medicines (glipizide) Thurs DOS.  . If your blood sugar is less than 70 mg/dL, you will need to treat for low blood sugar:  o Treat a low blood sugar (less than 70 mg/dL) with  cup of clear juice (cranberry or apple), 4 glucose tablets, OR glucose gel. o Recheck blood sugar in 15 minutes after treatment (to make sure it is greater than 70 mg/dL). If your blood sugar is not greater than 70 mg/dL on recheck, call [phone] for further instructions.  Anesthesia review: Yes  STOP now taking any Aspirin (unless otherwise instructed by your surgeon), Aleve, Naproxen, Ibuprofen, Motrin, Advil, Goody's, BC's, all herbal medications, fish oil, and all vitamins.   Coronavirus Screening Covid test on 06/11/20 was negative.  Patient verbalized understanding of instructions that were given via phone.

## 2020-06-13 NOTE — Progress Notes (Signed)
Anesthesia Chart Review:  Case: [id number] Date/Time: 06/14/20 0745   Procedures:      ESOPHAGOGASTRODUODENOSCOPY (EGD) WITH PROPOFOL (N/A )     UPPER ESOPHAGEAL ENDOSCOPIC ULTRASOUND (EUS) (N/A )     ENDOSCOPIC MUCOSAL RESECTION (N/A )     GI RADIOFREQUENCY ABLATION (N/A )   Anesthesia type: Monitor Anesthesia Care   Pre-op diagnosis: Barrett's HGD, Abnormal EGD, Dysphagia, Duodenal Stricture   Location: [facility] ROOM 1 / [facility]   Surgeons: Mansouraty, Telford Nab., MD      DISCUSSION: Patient is a 65 year old female scheduled for the above procedure. Per notes by Dr. Rush Landmark, patient "Seen by Dr. Paulita Fujita with EGD in 11/21 with findings of Barrett's with some ulceration/nodularity with HGD possible invasiveness being noted on pathology.  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 Referred to [facility] for EGD EMR with possible dilation but patient never scheduled this due to transportation. Asked to consider evaluation here in [place]."  History includes former smoker (quit 05/10/15), COPD (Stage 3 severe), asthma, LLL lung mass (s/p video bronchoscopy/EBUS 03/15/20: no malignant cells), OSA (does not use CPAP), HTN, HLD, DM2, CVA (2002), hypothyroidism, GERD, hiatal hernia, duodenal ulcer, pineal cyst, fibroids (s/p myomectomy x2), tachycardia, family history of premature CAD, migraines, chronic back pain, Barrett's esophagus. Reported history of failed moderate sedation (2013).  She was evaluated by cardiologist Dr. Ellyn Hack last on 03/12/20 for preoperative evaluation prior to bronchoscopy. He did not recommend testing prior to that procedure, but should she end up needing lung surgery then he would consider getting a coronary CTA and possibly echo.   She had pulmonology follow-up with Dr. Valeta Harms on 04/24/20. Bronchoscopy showed no malignancy identified. Follow-up CT scan on 04/19/20 revealed a original 4.1 x 3.2 cm lesion which was decreased in size to 3.5 x 2.3 cm. She had been treated  with antimicrobials, so he thought LLL lesion likely inflammatory/postinfectious type lesion. Repeat imaging in 6 months recommended. New prescription of Trelegy given for COPD.   06/11/2020 preprocedure COVID-19 test negative. Anesthesia team to evaluate on the day of procedure.   VS: LMP 02/27/2014   BP Readings from Last 3 Encounters:  05/29/20 (!) 150/100  04/24/20 120/78  03/15/20 120/76   Pulse Readings from Last 3 Encounters:  05/29/20 64  04/24/20 (!) 56  03/15/20 80    PROVIDERS: Andree Moro, DO is PCP [facility]) Glenetta Hew, MD is cardiologist June Leap, DO is pulmonologist Arta Silence, MD is GI   LABS: Currently last labs fro 05/29/20 show normal CBC, PT/INR, and BMET (with glucose 106). A1c 6.0% on 09/28/19.   OTHER: PFTs 03/30/20: FVC 1.75 (56%), post 2.00 (65%). FEV1 0.95 (40%), post 0.93 (39%). FEV1/FVC 54% (70%), post 47%. DLCO cor 18.43 (95%).   Sleep Study 01/04/19: IMPRESSION: 1, Moderate to severe obstructive sleep apnea. 2. Moderate oxyhemoglobin desaturation with an oxygen nadir of 76% and oxygen saturation less than 88% for 19.6 minutes.   IMAGES: CT Chest 04/19/20: IMPRESSION: 1. Interval decrease in size of the left lower lobe pulmonary lesion, now measuring 3.5 x 2.3 cm. No new or progressive interval findings. 2. Stable low-attenuation nodular thickening left adrenal gland, likely related to adenoma. 3. Aortic Atherosclerosis (ICD10-I70.0) and Emphysema (ICD10-J43.9).   EKG: 03/12/20 ([facility]): Sinus rhythm with occasional premature ventricular complexes   CV: Echo 07/04/14: Study Conclusions  - Left ventricle: The cavity size was normal. Wall thickness was  normal. Systolic function was normal. The estimated ejection  fraction was in the  range of 55% to 60%. Wall motion was normal;  there were no regional wall motion abnormalities. Doppler  parameters are consistent with abnormal left ventricular   relaxation (grade 1 diastolic dysfunction).  - Mitral valve: Calcified annulus.   Impressions:  - Normal LV function; grade 1 diastolic dysfunction; cannot R/O  PFO; suggest microcavitation study if clinically indicated.    Past Medical History:  Diagnosis Date  . Abnormal vaginal bleeding   . Anxiety   . Asthma   . Barrett's esophagus    with high grade dysplasia per endoscopy 05/2005 // followed by Dr. [name] ([facility])  . Chronic back pain   . COPD (chronic obstructive pulmonary disease) ([place])   . Diabetes mellitus without complication ([place])    type 2  . Duodenal ulcer disease    thought to be contributed at least partly by overuse of NSAIDs and salicylates (goody powdr)  . Endometrial polyp 03/2003   s/p resection in 03/2003. Path showing submucosal leiomyoma and benign  proliferative type endometrium  . Family history of premature CAD 05/19/2018  . Fibroid uterus    s/p myomectomy x 2  . GERD (gastroesophageal reflux disease)   . H/O failed moderate sedation   . Hiatal hernia   . History of CVA (cerebrovascular accident) 10/2000  . 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 Hyperlipidemia   . Hypertension   . Hypothyroidism    with hx of multinodular goiter (noted on neck US in 11/2002 - showing diffuse nodularity and inhomogenous texture diffusely BL)  . Internal hemorrhoids    grade 2 per colonoscopy in 09/2006 - repeat colonoscopy rec in 5-10 years.  . Migraine   . Nevus   . Personal history of failed conscious sedation 08/11/2011  . Sleep apnea    does not use cpap  . Stroke ([place]) 10/2000  . Tachycardia   . Thyroid disease   . Tobacco abuse Jan 2014   quit    Past Surgical History:  Procedure Laterality Date  . APPENDECTOMY  2017  . BIOPSY  02/15/2020   Procedure: BIOPSY;  Surgeon: Arta Silence, MD;  Location: [facility];  Service: Endoscopy;;  . BRONCHIAL  BIOPSY  03/15/2020   Procedure: BRONCHIAL BIOPSIES;  Surgeon: Garner Nash, DO;  Location: [place] [facility];  Service: Pulmonary;;  . BRONCHIAL BRUSHINGS  03/15/2020   Procedure: BRONCHIAL BRUSHINGS;  Surgeon: Garner Nash, DO;  Location: [place];  Service: Pulmonary;;  . BRONCHIAL NEEDLE ASPIRATION BIOPSY  03/15/2020   Procedure: BRONCHIAL NEEDLE ASPIRATION BIOPSIES;  Surgeon: Garner Nash, DO;  Location: [place] [facility];  Service: Pulmonary;;  . BRONCHIAL WASHINGS  03/15/2020   Procedure: BRONCHIAL WASHINGS;  Surgeon: Garner Nash, DO;  Location: [place] [facility];  Service: Pulmonary;;  . CHOLECYSTECTOMY    . COLONOSCOPY  02/07/2010  . COLONOSCOPY WITH PROPOFOL N/A 02/15/2020   Procedure: COLONOSCOPY WITH PROPOFOL;  Surgeon: Arta Silence, MD;  Location: [facility];  Service: Endoscopy;  Laterality: N/A;  . DILATION AND CURETTAGE OF UTERUS    . ESOPHAGOGASTRODUODENOSCOPY (EGD) WITH PROPOFOL N/A 02/15/2020   Procedure: ESOPHAGOGASTRODUODENOSCOPY (EGD) WITH PROPOFOL;  Surgeon: Arta Silence, MD;  Location: [facility];  Service: Endoscopy;  Laterality: N/A;  . HYSTEROSCOPY    . LAPAROSCOPIC APPENDECTOMY N/A 09/23/2015   Procedure: APPENDECTOMY LAPAROSCOPIC;  Surgeon: Georganna Skeans, MD;  Location: [place];  Service: General;  Laterality: N/A;  . [place]   x 2 - In 1997, noted to have extensive pelvic adhesions and BL tubal obstruction  . POLYPECTOMY  02/15/2020   Procedure: POLYPECTOMY;  Surgeon: Arta Silence, MD;  Location: [facility];  Service: Endoscopy;;  . TRANSTHORACIC ECHOCARDIOGRAM  06/2014    EF 55-60%.  No RWMA. Gr 1 DD. Cannot R/o PFO (consider bubble study).  Marland Kitchen UPPER GASTROINTESTINAL ENDOSCOPY  12/18/2009  . VIDEO BRONCHOSCOPY WITH ENDOBRONCHIAL NAVIGATION N/A 03/15/2020   Procedure: VIDEO BRONCHOSCOPY WITH ENDOBRONCHIAL NAVIGATION;  Surgeon: Garner Nash, DO;  Location: [place];  Service: Pulmonary;  Laterality: N/A;  . VIDEO BRONCHOSCOPY WITH  ENDOBRONCHIAL ULTRASOUND  03/15/2020   Procedure: VIDEO BRONCHOSCOPY WITH ENDOBRONCHIAL ULTRASOUND;  Surgeon: Garner Nash, DO;  Location: [place] [facility];  Service: Pulmonary;;    MEDICATIONS: No current facility-administered medications for this encounter.   Marland Kitchen albuterol (PROAIR HFA) 108 (90 Base) MCG/ACT inhaler  . aspirin EC 81 MG tablet  . Aspirin-Acetaminophen (GOODYS BODY PAIN PO)  . atorvastatin (LIPITOR) 80 MG tablet  . Biotin 5000 MCG TABS  . bisoprolol (ZEBETA) 10 MG tablet  . CINNAMON PO  . COLLAGEN PO  . COLLAGEN-VITAMIN C PO  . estradiol (ESTRACE VAGINAL) 0.1 MG/GM vaginal cream  . Fluticasone-Umeclidin-Vilant (TRELEGY ELLIPTA) 100-62.5-25 MCG/INH AEPB  . glipiZIDE (GLUCOTROL XL) 5 MG 24 hr tablet  . guaiFENesin (MUCINEX) 600 MG 12 hr tablet  . hydrALAZINE (APRESOLINE) 50 MG tablet  . levothyroxine (SYNTHROID) 137 MCG tablet  . losartan (COZAAR) 100 MG tablet  . Magnesium 250 MG TABS  . Melatonin 10 MG TABS  . omeprazole (PRILOSEC) 20 MG capsule  . valACYclovir (VALTREX) 500 MG tablet  . glucose blood (ONETOUCH VERIO) test strip  . ipratropium-albuterol (DUONEB) 0.5-2.5 (3) MG/3ML SOLN  . Lancets (ONETOUCH DELICA PLUS [id number]) MISC  . LORazepam (ATIVAN) 0.5 MG tablet  . Respiratory Therapy Supplies (NEBULIZER/TUBING/MOUTHPIECE) KIT  . Tiotropium Bromide Monohydrate (SPIRIVA RESPIMAT) 2.5 MCG/ACT AERS    Myra Gianotti, PA-C Surgical Short Stay/Anesthesiology [facility] Phone [phone] [facility] Phone [phone] 06/13/2020 12:03 PM

## 2020-06-14 ENCOUNTER — Ambulatory Visit (HOSPITAL_COMMUNITY): Payer: Medicare Other | Admitting: Physician Assistant

## 2020-06-14 ENCOUNTER — Ambulatory Visit (HOSPITAL_COMMUNITY)
Admission: RE | Admit: 2020-06-14 | Discharge: 2020-06-14 | Disposition: A | Discharge status: Home / Self Care | Payer: Medicare Other | Attending: Gastroenterology | Admitting: Gastroenterology

## 2020-06-14 ENCOUNTER — Encounter (HOSPITAL_COMMUNITY)
Admission: RE | Disposition: A | Discharge status: Home / Self Care | Payer: Self-pay | Source: Home / Self Care | Attending: Gastroenterology

## 2020-06-14 ENCOUNTER — Telehealth: Payer: Self-pay | Admitting: Gastroenterology

## 2020-06-14 ENCOUNTER — Encounter (HOSPITAL_COMMUNITY): Payer: Self-pay | Admitting: Gastroenterology

## 2020-06-14 ENCOUNTER — Other Ambulatory Visit: Payer: Self-pay

## 2020-06-14 DIAGNOSIS — K22711 Barrett's esophagus with high grade dysplasia: Secondary | ICD-10-CM

## 2020-06-14 DIAGNOSIS — R590 Localized enlarged lymph nodes: Secondary | ICD-10-CM | POA: Insufficient documentation

## 2020-06-14 DIAGNOSIS — E785 Hyperlipidemia, unspecified: Secondary | ICD-10-CM | POA: Insufficient documentation

## 2020-06-14 DIAGNOSIS — K2980 Duodenitis without bleeding: Secondary | ICD-10-CM | POA: Insufficient documentation

## 2020-06-14 DIAGNOSIS — I10 Essential (primary) hypertension: Secondary | ICD-10-CM | POA: Diagnosis not present

## 2020-06-14 DIAGNOSIS — J449 Chronic obstructive pulmonary disease, unspecified: Secondary | ICD-10-CM | POA: Insufficient documentation

## 2020-06-14 DIAGNOSIS — Z79899 Other long term (current) drug therapy: Secondary | ICD-10-CM | POA: Insufficient documentation

## 2020-06-14 DIAGNOSIS — E119 Type 2 diabetes mellitus without complications: Secondary | ICD-10-CM | POA: Insufficient documentation

## 2020-06-14 DIAGNOSIS — Z8673 Personal history of transient ischemic attack (TIA), and cerebral infarction without residual deficits: Secondary | ICD-10-CM | POA: Diagnosis not present

## 2020-06-14 DIAGNOSIS — T183XXA Foreign body in small intestine, initial encounter: Secondary | ICD-10-CM

## 2020-06-14 DIAGNOSIS — E039 Hypothyroidism, unspecified: Secondary | ICD-10-CM | POA: Insufficient documentation

## 2020-06-14 DIAGNOSIS — F419 Anxiety disorder, unspecified: Secondary | ICD-10-CM | POA: Diagnosis not present

## 2020-06-14 DIAGNOSIS — Z87891 Personal history of nicotine dependence: Secondary | ICD-10-CM | POA: Insufficient documentation

## 2020-06-14 DIAGNOSIS — K2100 Gastro-esophageal reflux disease with esophagitis, without bleeding: Secondary | ICD-10-CM | POA: Insufficient documentation

## 2020-06-14 DIAGNOSIS — K315 Obstruction of duodenum: Secondary | ICD-10-CM | POA: Insufficient documentation

## 2020-06-14 DIAGNOSIS — K22719 Barrett's esophagus with dysplasia, unspecified: Secondary | ICD-10-CM

## 2020-06-14 DIAGNOSIS — Z7984 Long term (current) use of oral hypoglycemic drugs: Secondary | ICD-10-CM | POA: Diagnosis not present

## 2020-06-14 DIAGNOSIS — Z888 Allergy status to other drugs, medicaments and biological substances status: Secondary | ICD-10-CM | POA: Insufficient documentation

## 2020-06-14 DIAGNOSIS — K2970 Gastritis, unspecified, without bleeding: Secondary | ICD-10-CM | POA: Diagnosis not present

## 2020-06-14 DIAGNOSIS — R198 Other specified symptoms and signs involving the digestive system and abdomen: Secondary | ICD-10-CM

## 2020-06-14 DIAGNOSIS — Z79890 Hormone replacement therapy (postmenopausal): Secondary | ICD-10-CM | POA: Insufficient documentation

## 2020-06-14 DIAGNOSIS — Z885 Allergy status to narcotic agent status: Secondary | ICD-10-CM | POA: Insufficient documentation

## 2020-06-14 DIAGNOSIS — R1310 Dysphagia, unspecified: Secondary | ICD-10-CM | POA: Diagnosis not present

## 2020-06-14 HISTORY — PX: BIOPSY: SHX5522

## 2020-06-14 HISTORY — PX: BALLOON DILATION: SHX5330

## 2020-06-14 HISTORY — PX: ENDOSCOPIC MUCOSAL RESECTION: SHX6839

## 2020-06-14 HISTORY — PX: FOREIGN BODY REMOVAL: SHX962

## 2020-06-14 HISTORY — PX: UPPER ESOPHAGEAL ENDOSCOPIC ULTRASOUND (EUS): SHX6562

## 2020-06-14 HISTORY — PX: ESOPHAGOGASTRODUODENOSCOPY (EGD) WITH PROPOFOL: SHX5813

## 2020-06-14 HISTORY — DX: Herpesviral infection, unspecified: B00.9

## 2020-06-14 LAB — GLUCOSE, CAPILLARY
Glucose-Capillary: 78 mg/dL (ref 70–99)
Glucose-Capillary: 81 mg/dL (ref 70–99)

## 2020-06-14 SURGERY — ESOPHAGOGASTRODUODENOSCOPY (EGD) WITH PROPOFOL
Anesthesia: Monitor Anesthesia Care

## 2020-06-14 MED ORDER — DEXAMETHASONE SODIUM PHOSPHATE 10 MG/ML IJ SOLN
INTRAMUSCULAR | Status: DC | PRN
  Administered 2020-06-14: 4 mg via INTRAVENOUS

## 2020-06-14 MED ORDER — SUCCINYLCHOLINE CHLORIDE 20 MG/ML IJ SOLN
INTRAMUSCULAR | Status: DC | PRN
Start: 2020-06-14 — End: 2020-06-14
  Administered 2020-06-14: 120 mg via INTRAVENOUS

## 2020-06-14 MED ORDER — FENTANYL CITRATE (PF) 100 MCG/2ML IJ SOLN
INTRAMUSCULAR | Status: AC
  Filled 2020-06-14: qty 2

## 2020-06-14 MED ORDER — HYDROCODONE-ACETAMINOPHEN 7.5-325 MG/15ML PO SOLN
15.0000 mL | Freq: Four times a day (QID) | ORAL | 0 refills | Status: AC | PRN
Start: 2020-06-14 — End: 2021-06-14

## 2020-06-14 MED ORDER — PROPOFOL 10 MG/ML IV BOLUS
INTRAVENOUS | Status: DC | PRN
  Administered 2020-06-14: 20 mg via INTRAVENOUS
  Administered 2020-06-14: 120 mg via INTRAVENOUS
  Administered 2020-06-14: 30 mg via INTRAVENOUS

## 2020-06-14 MED ORDER — ACETYLCYSTEINE 20 % IN SOLN
3.0000 mL | Freq: Once | RESPIRATORY_TRACT | Status: DC
  Filled 2020-06-14 (×2): qty 4

## 2020-06-14 MED ORDER — SUGAMMADEX SODIUM 200 MG/2ML IV SOLN
INTRAVENOUS | Status: DC | PRN
  Administered 2020-06-14: 200 mg via INTRAVENOUS

## 2020-06-14 MED ORDER — OMEPRAZOLE 40 MG PO CPDR: 40.0000 mg | DELAYED_RELEASE_CAPSULE | Freq: Two times a day (BID) | ORAL | 11 refills | Status: DC

## 2020-06-14 MED ORDER — LACTATED RINGERS IV SOLN: Freq: Once | INTRAVENOUS | Status: AC

## 2020-06-14 MED ORDER — PROMETHAZINE HCL 25 MG/ML IJ SOLN: 6.2500 mg | INTRAMUSCULAR | Status: DC | PRN

## 2020-06-14 MED ORDER — SODIUM CHLORIDE 0.9 % IV SOLN: INTRAVENOUS | Status: DC

## 2020-06-14 MED ORDER — ROCURONIUM BROMIDE 100 MG/10ML IV SOLN
INTRAVENOUS | Status: DC | PRN
Start: 2020-06-14 — End: 2020-06-14
  Administered 2020-06-14: 20 mg via INTRAVENOUS
  Administered 2020-06-14: 40 mg via INTRAVENOUS

## 2020-06-14 MED ORDER — ACETAMINOPHEN 10 MG/ML IV SOLN: 1000.0000 mg | Freq: Once | INTRAVENOUS | Status: DC | PRN

## 2020-06-14 MED ORDER — HYDROCODONE-ACETAMINOPHEN 7.5-325 MG/15ML PO SOLN
10.0000 mL | Freq: Four times a day (QID) | ORAL | 0 refills | Status: DC | PRN
Start: 2020-06-14 — End: 2020-06-14

## 2020-06-14 MED ORDER — ONDANSETRON HCL 4 MG/2ML IJ SOLN
INTRAMUSCULAR | Status: DC | PRN
  Administered 2020-06-14: 4 mg via INTRAVENOUS

## 2020-06-14 MED ORDER — AMBULATORY NON FORMULARY MEDICATION: 0 refills | Status: DC

## 2020-06-14 MED ORDER — EPHEDRINE SULFATE-NACL 50-0.9 MG/10ML-% IV SOSY
PREFILLED_SYRINGE | INTRAVENOUS | Status: DC | PRN
  Administered 2020-06-14: 10 mg via INTRAVENOUS

## 2020-06-14 MED ORDER — PROPOFOL 500 MG/50ML IV EMUL
INTRAVENOUS | Status: DC | PRN
  Administered 2020-06-14: 100 ug/kg/min via INTRAVENOUS

## 2020-06-14 MED ORDER — PHENYLEPHRINE HCL (PRESSORS) 10 MG/ML IV SOLN
INTRAVENOUS | Status: DC | PRN
Start: 2020-06-14 — End: 2020-06-14
  Administered 2020-06-14 (×3): 80 ug via INTRAVENOUS

## 2020-06-14 MED ORDER — LACTATED RINGERS IV SOLN: INTRAVENOUS | Status: DC | PRN

## 2020-06-14 MED ORDER — FENTANYL CITRATE (PF) 100 MCG/2ML IJ SOLN
25.0000 ug | INTRAMUSCULAR | Status: DC | PRN
  Administered 2020-06-14 (×2): 50 ug via INTRAVENOUS

## 2020-06-14 MED ORDER — LIDOCAINE HCL (CARDIAC) PF 100 MG/5ML IV SOSY
PREFILLED_SYRINGE | INTRAVENOUS | Status: DC | PRN
  Administered 2020-06-14: 80 mg via INTRAVENOUS

## 2020-06-14 MED ORDER — SUCRALFATE 1 GM/10ML PO SUSP: 1.0000 g | Freq: Four times a day (QID) | ORAL | 2 refills | Status: DC

## 2020-06-14 MED ORDER — FENTANYL CITRATE (PF) 100 MCG/2ML IJ SOLN: 25.0000 ug | INTRAMUSCULAR | Status: DC | PRN

## 2020-06-14 SURGICAL SUPPLY — 15 items

## 2020-06-14 NOTE — Anesthesia Postprocedure Evaluation (Signed)
Anesthesia Post Note  Patient: Grace [name]  Procedure(s) Performed: ESOPHAGOGASTRODUODENOSCOPY (EGD) WITH PROPOFOL (N/A ) UPPER ESOPHAGEAL ENDOSCOPIC ULTRASOUND (EUS) (N/A ) ENDOSCOPIC MUCOSAL RESECTION (N/A ) FOREIGN BODY REMOVAL BIOPSY BALLOON DILATION (N/A )     Patient location during evaluation: PACU Anesthesia Type: MAC Level of consciousness: awake and alert Pain management: pain level controlled Vital Signs Assessment: post-procedure vital signs reviewed and stable Respiratory status: spontaneous breathing, nonlabored ventilation, respiratory function stable and patient connected to nasal cannula oxygen Cardiovascular status: blood pressure returned to baseline and stable Postop Assessment: no apparent nausea or vomiting Anesthetic complications: no   No complications documented.  Last Vitals:  Vitals:   06/14/20 1048 06/14/20 1103  BP: (!) 118/51 113/64  Pulse: 70 69  Resp: 15 14  Temp:  36.7 C  SpO2: 93% 92%    Last Pain:  Vitals:   06/14/20 1048  TempSrc:   PainSc: 6                  Chidiebere Wynn P Burnette Sautter

## 2020-06-14 NOTE — Transfer of Care (Signed)
Immediate Anesthesia Transfer of Care Note  Patient: Grace [name]  Procedure(s) Performed: ESOPHAGOGASTRODUODENOSCOPY (EGD) WITH PROPOFOL (N/A ) UPPER ESOPHAGEAL ENDOSCOPIC ULTRASOUND (EUS) (N/A ) ENDOSCOPIC MUCOSAL RESECTION (N/A ) FOREIGN BODY REMOVAL BIOPSY BALLOON DILATION (N/A )  Patient Location: PACU  Anesthesia Type:General  Level of Consciousness: awake, oriented and patient cooperative  Airway & Oxygen Therapy: Patient Spontanous Breathing and Patient connected to nasal cannula oxygen  Post-op Assessment: Report given to RN and Post -op Vital signs reviewed and stable  Post vital signs: Reviewed  Last Vitals:  Vitals Value Taken Time  BP 122/66 06/14/20 1033  Temp 36.6 C 06/14/20 1018  Pulse 74 06/14/20 1037  Resp 17 06/14/20 1037  SpO2 93 % 06/14/20 1037  Vitals shown include unvalidated device data.  Last Pain:  Vitals:   06/14/20 1018  TempSrc:   PainSc: 8          Complications: No complications documented.

## 2020-06-14 NOTE — Telephone Encounter (Signed)
I have sent this again to [place]. Thank you. GM

## 2020-06-14 NOTE — Interval H&P Note (Signed)
History and Physical Interval Note:  06/14/2020 7:50 AM  Grace [name]  has presented today for surgery, with the diagnosis of Barrett's HGD, Abnormal EGD, Dysphagia, Duodenal Stricture.  The various methods of treatment have been discussed with the patient and family. After consideration of risks, benefits and other options for treatment, the patient has consented to  Procedure(s): ESOPHAGOGASTRODUODENOSCOPY (EGD) WITH PROPOFOL (N/A) UPPER ESOPHAGEAL ENDOSCOPIC ULTRASOUND (EUS) (N/A) ENDOSCOPIC MUCOSAL RESECTION (N/A) GI RADIOFREQUENCY ABLATION (N/A) as a surgical intervention.  The patient's history has been reviewed, patient examined, no change in status, stable for surgery.  I have reviewed the patient's chart and labs.  Questions were answered to the patient's satisfaction.    The risks of an EUS including intestinal perforation, bleeding, infection, aspiration, and medication effects were discussed as was the possibility it may not give a definitive diagnosis if a biopsy is performed.  When a biopsy of the pancreas is done as part of the EUS, there is an additional risk of pancreatitis at the rate of about 1-2%.  It was explained that procedure related pancreatitis is typically mild, although it can be severe and even life threatening, which is why we do not perform random pancreatic biopsies and only biopsy a lesion/area we feel is concerning enough to warrant the risk.   [employer]

## 2020-06-14 NOTE — Telephone Encounter (Signed)
Prescription resent to [place] for Hycet and Imprivata used/completed. Patty, please check on patient tomorrow. Thanks. GM

## 2020-06-14 NOTE — Telephone Encounter (Signed)
Dr Rush Landmark is aware and will be sending the prescription today.

## 2020-06-14 NOTE — Telephone Encounter (Signed)
Patient called states she called [facility] and they do not have her medicine

## 2020-06-14 NOTE — Telephone Encounter (Signed)
Dr Rush Landmark do you have the hydrocodone acetaminophen prescription ready to be sent in your messages?  The pt states that it was not available at the pharmacy.

## 2020-06-14 NOTE — Anesthesia Procedure Notes (Signed)
Procedure Name: Intubation Date/Time: 06/14/2020 8:13 AM Performed by: Moshe Salisbury, CRNA Pre-anesthesia Checklist: Patient identified, Emergency Drugs available, Suction available and Patient being monitored Patient Re-evaluated:Patient Re-evaluated prior to induction Oxygen Delivery Method: Circle system utilized Preoxygenation: Pre-oxygenation with 100% oxygen Induction Type: IV induction Laryngoscope Size: Mac and 4 Grade View: Grade II Tube type: Oral Tube size: 7.5 mm Number of attempts: 1 Airway Equipment and Method: Stylet and Oral airway Placement Confirmation: ETT inserted through vocal cords under direct vision,  positive ETCO2 and breath sounds checked- equal and bilateral Secured at: 21 cm Tube secured with: Tape Dental Injury: Teeth and Oropharynx as per pre-operative assessment

## 2020-06-14 NOTE — Op Note (Signed)
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 Icard,                            MD Medicines:                Monitored Anesthesia Care and then transitioned to                            General Anesthesia after significant foodstuffs                            noted in stomach and duodenum Complications:            No immediate complications. Estimated Blood Loss:     Estimated blood loss was minimal. Procedure:                Pre-Anesthesia Assessment:                           - Prior to the procedure, a History and Physical                            was performed, and patient medications and                            allergies were reviewed. The patient's tolerance of  previous anesthesia was also reviewed. The risks                            and benefits of the procedure and the sedation                            options and risks were discussed with the patient.                            All questions were answered, and informed consent                            was obtained. Prior Anticoagulants: The patient has                             taken no previous anticoagulant or antiplatelet                            agents except for aspirin. ASA Grade Assessment:                            III - A patient with severe systemic disease. After                            reviewing the risks and benefits, the patient was                            deemed in satisfactory condition to undergo the                            procedure.                           After obtaining informed consent, the endoscope was                            passed under direct vision. Throughout the                            procedure, the patient's blood pressure, pulse, and                            oxygen saturations were monitored continuously. The                            [id number] ([id number]) Olympus therapeutic                            gastroscope was introduced through the mouth, and                            advanced to the duodenal bulb. The Endoscope was  introduced through the mouth, and advanced to the                            second part of duodenum. The [id number] ([id number])                            Olympus Radial EUS scope was introduced through the                            mouth, and advanced to the stomach for ultrasound                            examination from the esophagus and stomach. The                            upper EUS was technically difficult and complex due                            to presence of food and stricture. Successful                            completion of the procedure was aided by performing                            the maneuvers documented (below) in this report.                            The patient tolerated the procedure. Scope In: Scope Out: Findings:      ENDOSCOPIC FINDING: :      No gross lesions were noted in the proximal esophagus and in the mid       esophagus.      The esophagus and gastroesophageal junction were examined with white        light and narrow band imaging (NBI) from a forward view and retroflexed       position. There were esophageal mucosal changes consistent with       short-segment Barrett's esophagus. These changes involved the mucosa at       the upper extent of the gastric folds (40 cm from the incisors)       extending to the Z-line (40 cm from the incisors). Two tongues of       salmon-colored mucosa were present from 38 to 40 cm and nodularity was       present from 38 to 39 cm. The maximum longitudinal extent of these       esophageal mucosal changes was 2 cm in length. After the completion of       the EUS, preparations were made for mucosal resection of the nodularity       noted at the GEJxn at 40 cm. NBI imaging and White-light endoscopy was       done to demarcate the borders of the lesion. EMR Band ligator and snare       mucosal resection was performed. Resection and retrieval were complete.       Re-evaluate of the area did not show evidence of active bleeding thus no  closure performed. Mucosa was biopsied with a cold forceps for histology       in 4 quadrants at intervals of 1 cm from 39 to 40 cm from the incisors.       A total of 2 specimen bottles were sent to pathology.      A medium amount of food (residue) was found in the gastric body. Removal       of food was accomplished with a Jabier Mutton net to facilite procedure       completion.      Striped moderate inflammation characterized by erythema and granularity       was found in the gastric body and in the gastric antrum. Biopsies were       taken with a cold forceps for histology and Helicobacter pylori testing.      Food (residue) was found in the duodenal bulb. Removal of food was       accomplished with a Jabier Mutton net to facility procedure completion.      This then led [place] to find an acquired benign-appearing, intrinsic severe       stenosis less than 1 cm in length but only about 4 mm in size which was       found in apex of the  bulb leading to the D1/D2 sweep and was traversed       only after transitioning to the Neonatal endoscope. Previous ulceration       described does not seem present but certainly causing obstruction (which       is interesting since Upper GI series did not suggest any narrowing).       Biopsies were taken with a cold forceps for histology. Placement of a       long 0.025 inch Antonietta Breach was attempted. This passed successfully into the       distal duodenum through the Neonatal endoscope. I then backloaded this       on the therapeutic EGD scope. Over this wire, a TTS dilator was passed       through the scope. Dilation with a 6-7-8 mm pyloric balloon dilator was       performed. The dilation site was examined and showed moderate mucosal       disruption, moderate improvement in luminal narrowing and no perforation.      No gross lesions were noted in the second portion of the duodenum (only       visualized with Neonatal endoscope).      ENDOSONOGRAPHIC FINDING: :      Endosonographic imaging in the thoracic esophagus and in the       gastroesophageal junction showed no intramural (subepithelial) lesion,       mass or wall thickening.      One enlarged lymph node was visualized in the lower paraesophageal       mediastinum (level 8L) with the ultrasound probe located 40 cm from the       incisors. It measured 3.1 mm by 3.6 mm in maximal cross-sectional       diameter. The node was round, heterogenous and had well defined margins.      Endosonographic imaging in the visualized portion of the liver showed no       mass-lesion.      The celiac region was visualized. Impression:               EGD Impression:                           -  No gross lesions in esophagus proximally.                           - Esophageal mucosal changes consistent with                            short-segment Barrett's esophagus distally noted.                            There was nodularity that was resected with  EMR                            Band ligation. Other regions without nodularity                            were biopsied.                           - A medium amount of food (residue) in the stomach.                            Removal was successful.                           - Gastritis. Biopsied.                           - Retained food in the duodenum bulb. Removal was                            successful.                           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 Biopsied. Dilated.                           - No gross lesions in the second portion of the                            duodenum.                           EUS Impression:                           - Endosonographic imaging in the thoracic esophagus                            and in the gastroesophageal junction showed no                            intramural (subepithelial) lesion, mass or wall  thickening.                           - One enlarged lymph node was visualized in the                            lower paraesophageal mediastinum (level 8L). Tissue                            has not been obtained. However, the endosonographic                            appearance is suggestive of benign inflammatory                            changes and does not meet EUS criteria for                            malignant appearance and thus was not biopsied. Recommendation:           - The patient will be observed post-procedure,                            until all discharge criteria are met.                           - Discharge patient to home.                           - Patient has a contact number available for                            emergencies. The signs and symptoms of potential                            delayed complications were discussed with the                            patient. Return to normal activities tomorrow.                             Written discharge instructions were provided to the                            patient.                           - Clear liquid diet for 24 hours. Then, full liquid                            diet for 24 hours. Then, soft diet for 1 week                            thereafter. Then advance your diet thereafter to  regular.                           - Continue your PPI 40 mg twice daily for the next                            3 months.                           - You will be prescribed a lidocaine/antiacid                            mixture that you can take up to 4 times per day as                            needed (prescription has been sent to your                            pharmacy) - take no matter what for at least first                            3-days.                           - You will be prescribed liquid Tylenol with                            hydrocodone that you can use up to 4 times daily                            for the next 72 hours if needed (prescription to be                            sent to your pharmacy) - You may use this if you                            feel you are having some discomfort but do not                            suspect that will be for more than 2-3 days. You                            have an allergy to codeine that causes you                            headaches, it is possible this could do the same                            but some individuals will not have similiar                            reactions so it will be up to you to see if  this                            occurs or not and if it does because you do use it,                            then you may stop it and let us know.                           - You will be prescribed Sucralfate Suspension and                            you will take this 4 times daily (make sure you are                            not taking any other  medication 1 hour before or 1                            hour after this medication is taken) to allow                            coating and healing of your esophagus - this needs                            to be taken for at least 60-month(you should have                            refills available but call if you run out).                           - Observe patient's clinical course.                           - Await path results.                           - Repeat the EGD with hope that we can proceed with                            RFA in 341-month                           - The findings and recommendations were discussed                            with the patient.                           - The findings and recommendations were discussed                            with the designated responsible adult. Procedure Code(s):        --- Professional ---  53202, Esophagogastroduodenoscopy, flexible,                            transoral; with endoscopic mucosal resection                           43237, Esophagogastroduodenoscopy, flexible,                            transoral; with endoscopic ultrasound examination                            limited to the esophagus, stomach or duodenum, and                            adjacent structures                           43247, Esophagogastroduodenoscopy, flexible,                            transoral; with removal of foreign body(s)                           43245, Esophagogastroduodenoscopy, flexible,                            transoral; with dilation of gastric/duodenal                            stricture(s) (eg, balloon, bougie) Diagnosis Code(s):        --- Professional ---                           B34.3HWY, Foreign body in stomach, initial encounter                           K29.70, Gastritis, unspecified, without bleeding                           T18.3XXA, Foreign body in small intestine, initial                             encounter                           K31.5, Obstruction of duodenum                           R59.0, Localized enlarged lymph nodes                           K22.8, Other specified diseases of esophagus                           K22.711, Barrett's esophagus with high grade  dysplasia                           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 All rights reserved. The codes documented in this report are preliminary and upon coder review may  be revised to meet current compliance requirements. Justice Britain, MD 06/14/2020 10:32:51 AM Number of Addenda: 0

## 2020-06-14 NOTE — Telephone Encounter (Signed)
I spoke with the pharmacy and was told that the only way they can accept the prescription is by E scribe.  The doctor has to send to them directly.  No fax or call in is allowed.

## 2020-06-14 NOTE — Telephone Encounter (Signed)
Looks like I signed it. But looks like it may have needed to be phoned in. Can you check with the pharmacy? If you need me to sign something and then you formally fax it, let me know I am in the Endoscopy unit upstairs. Thanks. GM

## 2020-06-15 ENCOUNTER — Encounter (HOSPITAL_COMMUNITY): Payer: Self-pay | Admitting: Gastroenterology

## 2020-06-15 LAB — SURGICAL PATHOLOGY

## 2020-06-15 NOTE — Telephone Encounter (Signed)
Pt states she is resting/sleeping quite a bit. Other than sore throat and pain in her chest as Dr. Rush Landmark stated she would have she is fine. She is taking the meds as prescribed and knows to call back if she develops any new issues that are not controlled by the meds she has.

## 2020-06-15 NOTE — Telephone Encounter (Signed)
Thank you for update. If her pain progresses or worsens or is not improved over weekend, she needs to reach out to [place] even if necessary over weekend, but that should be gone by early next week. Thanks. GM

## 2020-06-16 ENCOUNTER — Encounter: Payer: Self-pay | Admitting: Gastroenterology

## 2020-06-18 ENCOUNTER — Other Ambulatory Visit: Payer: Self-pay | Admitting: Nurse Practitioner

## 2020-06-18 DIAGNOSIS — I10 Essential (primary) hypertension: Secondary | ICD-10-CM

## 2020-06-19 ENCOUNTER — Telehealth: Payer: Self-pay | Admitting: Gastroenterology

## 2020-06-19 ENCOUNTER — Other Ambulatory Visit: Payer: Self-pay

## 2020-06-19 DIAGNOSIS — K315 Obstruction of duodenum: Secondary | ICD-10-CM

## 2020-06-19 NOTE — Telephone Encounter (Signed)
See alternate note 3/8

## 2020-06-19 NOTE — Telephone Encounter (Signed)
Pt is returning a call back from a nurse regarding pathology report.

## 2020-06-20 ENCOUNTER — Other Ambulatory Visit: Payer: Self-pay | Admitting: Nurse Practitioner

## 2020-06-20 DIAGNOSIS — I10 Essential (primary) hypertension: Secondary | ICD-10-CM

## 2020-06-21 ENCOUNTER — Other Ambulatory Visit: Payer: Self-pay | Admitting: Nurse Practitioner

## 2020-06-21 DIAGNOSIS — E1169 Type 2 diabetes mellitus with other specified complication: Secondary | ICD-10-CM

## 2020-06-21 DIAGNOSIS — E785 Hyperlipidemia, unspecified: Secondary | ICD-10-CM

## 2020-06-22 ENCOUNTER — Encounter (INDEPENDENT_AMBULATORY_CARE_PROVIDER_SITE_OTHER): Payer: Self-pay

## 2020-06-22 ENCOUNTER — Other Ambulatory Visit: Payer: Self-pay | Admitting: Nurse Practitioner

## 2020-06-22 DIAGNOSIS — I10 Essential (primary) hypertension: Secondary | ICD-10-CM

## 2020-06-22 NOTE — Telephone Encounter (Signed)
FYI on patient mychart message  I went to my primary care provider Tuesday the 8. I am unsure if he sends the info to my providers since he is not in the [facility]. He gave me prescriptions for Doxycycline Monohydrate 100 MG and Prednisone 20 MG because he felt like I am having a slight exacerbation with my COPD. I started the meds Wed and they seem to be helping. I am breathing a LOT easier. My oxygen saturation was oddly enough 98. Since I did not know if he sent the record to you, I can scan the after-visit summary for you if you need it. I just wanted Grace [name] to be aware of the meds. Thanks.

## 2020-07-05 NOTE — Progress Notes (Signed)
Attempted to obtain medical history via telephone, unable to reach at this time. I left a voicemail to return pre surgical testing department's phone call.  

## 2020-07-06 ENCOUNTER — Other Ambulatory Visit: Payer: Self-pay | Admitting: Nurse Practitioner

## 2020-07-06 DIAGNOSIS — I10 Essential (primary) hypertension: Secondary | ICD-10-CM

## 2020-07-07 ENCOUNTER — Other Ambulatory Visit (HOSPITAL_COMMUNITY)
Admission: RE | Admit: 2020-07-07 | Discharge: 2020-07-07 | Disposition: A | Discharge status: Home / Self Care | Payer: Medicare Other | Source: Ambulatory Visit | Attending: Gastroenterology | Admitting: Gastroenterology

## 2020-07-07 DIAGNOSIS — Z01812 Encounter for preprocedural laboratory examination: Secondary | ICD-10-CM | POA: Insufficient documentation

## 2020-07-07 DIAGNOSIS — Z20822 Contact with and (suspected) exposure to covid-19: Secondary | ICD-10-CM | POA: Diagnosis not present

## 2020-07-07 LAB — SARS CORONAVIRUS 2 (TAT 6-24 HRS): SARS Coronavirus 2: NEGATIVE

## 2020-07-09 ENCOUNTER — Telehealth: Payer: Self-pay | Admitting: Gastroenterology

## 2020-07-09 NOTE — Telephone Encounter (Signed)
The pt has been advised and declines any procedures at this time. She has agreed to follow up in about 6-10 weeks and will call back to make that appt.  She is not willing to commit to anything at this time.

## 2020-07-09 NOTE — Telephone Encounter (Signed)
Dr Rush Landmark pt is cancelling her appt for 3/30.  I tired to reschedule but she declines stating financial difficulties.  I offered her [facility] assistance information and declined as well.  The appt has been cancelled.

## 2020-07-09 NOTE — Telephone Encounter (Signed)
Thank you for the update Patty and reaching back out to her. Thankfully the esophageal resection showed no evidence of cancer but still showed high-grade dysplasia.  As I had discussed with her previously in clinic her risk of progression and high-grade dysplasia to esophagus cancer is between 3 and 10 %/year so hopefully she is able to move forward with follow-up procedures and follow-up in clinic and decrease her risk of the development of esophagus cancer. If you could please forward this information to the patient's primary care provider as well Patty in regards to this telephone call/message that would be great. Thanks. GM

## 2020-07-09 NOTE — Telephone Encounter (Signed)
Patty, I am sorry to hear about her financial issues.  Certainly understand why she may not be able to have procedure performed.  I am certainly okay with the patient not having this repeat EGD which was only for duodenal dilation as long as her symptoms are okay.  With this being said she does need an EGD with RFA in 2 months to begin therapy for her known Barrett's esophagus with high-grade dysplasia.  If she is willing to have that on the books then we should have that in our follow-up list.  Otherwise, she needs to follow-up in clinic at some point in the next 6 to 10 weeks so that we can go over things with her and certainly let her know about our concern of not pursuing repeat endoscopic evaluation for her known high-grade dysplasia Barrett's esophagus.  Thanks. GM  FYI Dr. Paulita Fujita about our mutual patient.

## 2020-07-09 NOTE — Telephone Encounter (Signed)
Note has been sent to the PCP as requested.

## 2020-07-09 NOTE — Telephone Encounter (Signed)
Patient called requested to cancel the upcoming hospital procedure on 07/11/20 due to financial reasons.

## 2020-07-11 ENCOUNTER — Encounter (HOSPITAL_COMMUNITY): Payer: Self-pay

## 2020-07-11 ENCOUNTER — Ambulatory Visit (HOSPITAL_COMMUNITY): Admit: 2020-07-11 | Payer: Medicare Other | Admitting: Gastroenterology

## 2020-07-11 SURGERY — ESOPHAGOGASTRODUODENOSCOPY (EGD) WITH PROPOFOL
Anesthesia: Monitor Anesthesia Care

## 2020-08-03 ENCOUNTER — Encounter (HOSPITAL_COMMUNITY): Payer: Self-pay | Admitting: Gastroenterology

## 2020-08-17 ENCOUNTER — Other Ambulatory Visit: Payer: Self-pay | Admitting: Family Medicine

## 2020-08-17 DIAGNOSIS — R928 Other abnormal and inconclusive findings on diagnostic imaging of breast: Secondary | ICD-10-CM

## 2020-08-20 ENCOUNTER — Ambulatory Visit
Admission: RE | Admit: 2020-08-20 | Discharge: 2020-08-20 | Disposition: A | Discharge status: Home / Self Care | Payer: Medicare Other | Source: Ambulatory Visit | Attending: General Practice | Admitting: General Practice

## 2020-08-20 ENCOUNTER — Other Ambulatory Visit: Payer: Self-pay | Admitting: Family Medicine

## 2020-08-20 ENCOUNTER — Other Ambulatory Visit: Payer: Self-pay

## 2020-08-20 DIAGNOSIS — R928 Other abnormal and inconclusive findings on diagnostic imaging of breast: Secondary | ICD-10-CM

## 2020-08-20 DIAGNOSIS — N6310 Unspecified lump in the right breast, unspecified quadrant: Secondary | ICD-10-CM

## 2020-08-30 ENCOUNTER — Other Ambulatory Visit: Payer: Self-pay | Admitting: Pulmonary Disease

## 2020-08-30 MED ORDER — TRELEGY ELLIPTA 100-62.5-25 MCG/INH IN AEPB: 1.0000 | INHALATION_SPRAY | Freq: Every day | RESPIRATORY_TRACT | 3 refills | Status: DC

## 2020-09-07 ENCOUNTER — Other Ambulatory Visit: Payer: Self-pay | Admitting: Nurse Practitioner

## 2020-09-07 DIAGNOSIS — N952 Postmenopausal atrophic vaginitis: Secondary | ICD-10-CM

## 2020-09-18 NOTE — Telephone Encounter (Signed)
[facility], Please call patient to schedule CT scan.  It looks like it was ordered for June or July, prior to her f/u.  She is trying to get into a study for RSV and needs the CT done for that purpose as well.  Thank you.

## 2020-09-20 NOTE — Telephone Encounter (Signed)
Sched CT for 6/15 at 3:00pm @c  [facility]. Called and gave pt appt info. Nothing further needed.

## 2020-09-26 ENCOUNTER — Ambulatory Visit (INDEPENDENT_AMBULATORY_CARE_PROVIDER_SITE_OTHER)
Admission: RE | Admit: 2020-09-26 | Discharge: 2020-09-26 | Disposition: A | Discharge status: Home / Self Care | Payer: Medicare Other | Source: Ambulatory Visit | Attending: Pulmonary Disease | Admitting: Pulmonary Disease

## 2020-09-26 ENCOUNTER — Other Ambulatory Visit: Payer: Self-pay

## 2020-09-26 DIAGNOSIS — R911 Solitary pulmonary nodule: Secondary | ICD-10-CM

## 2020-10-02 ENCOUNTER — Telehealth: Payer: Self-pay | Admitting: Primary Care

## 2020-10-02 NOTE — Telephone Encounter (Signed)
Called [name] to get a fax number but she did not answer. Left a message for her to call back.

## 2020-10-02 NOTE — Telephone Encounter (Signed)
Received the following message from patient:   "I am trying to get into a clinical trial for an RSV vaccine and time is kind of an issue now. The doctor at the clinic needs the following info asap:   Grace [name], [name] physician would like to get a statement from your pulmonologist (in [facility]) stating your recent chest CT has no concerning findings and that you are medically cleared (from pulmonology) to enroll in the RSV Vaccine trial. Thanks so much! [name] [phone] I was hoping that this info could be sent to them as quickly as possible.   I will be speaking to [facility] later today so I would appreciate it if it is possible to get this info to them. Thank you, Grace [name]"  Patient had a CT scan on 09/26/20.   Dr. Valeta Harms, can you please advise? Thanks!

## 2020-10-05 ENCOUNTER — Encounter: Payer: Self-pay | Admitting: Primary Care

## 2020-10-05 ENCOUNTER — Other Ambulatory Visit: Payer: Self-pay

## 2020-10-05 ENCOUNTER — Ambulatory Visit (INDEPENDENT_AMBULATORY_CARE_PROVIDER_SITE_OTHER): Payer: Medicare Other | Admitting: Primary Care

## 2020-10-05 ENCOUNTER — Telehealth: Payer: Self-pay | Admitting: Primary Care

## 2020-10-05 VITALS — BP 108/80 | HR 70 | Temp 97.8°F | Ht 63.0 in | Wt 169.0 lb

## 2020-10-05 DIAGNOSIS — J449 Chronic obstructive pulmonary disease, unspecified: Secondary | ICD-10-CM

## 2020-10-05 DIAGNOSIS — Z87891 Personal history of nicotine dependence: Secondary | ICD-10-CM

## 2020-10-05 NOTE — Assessment & Plan Note (Signed)
-   CT chest 09/27/20 showed near complete resolution of previous ill defined opacity in the central LLL with minimal residual atelectasis or scarring - She can resume lung cancer screening with LDCT annually

## 2020-10-05 NOTE — Patient Instructions (Signed)
It was a pleasure seeing you today Grace [name]  Recommendations: - No medication changes today - Use incentive spirometer 2-3 times daily to encourage deep breathing exercises   Orders: - Incentive spirometer  - Pulmonary function testing in 6 months   Follow-up: - 6 months with Dr. Loanne Drilling or sooner if needed      How to Use an Incentive Spirometer An incentive spirometer is a tool that measures how well you are filling your lungs with each breath. Learning to take long, deep breaths using this tool can help you keep your lungs clear and active. This may help to reverse or lessen your chance of developing breathing (pulmonary) problems, especially infection. You may be asked to use a spirometer: After a surgery. If you have a lung problem or a history of smoking. After a long period of time when you have been unable to move or be active. If the spirometer includes an indicator to show the highest number that you have reached, your health care provider or respiratory therapist will help youset a goal. Keep a log of your progress as told by your health care provider. What are the risks? Breathing too quickly may cause dizziness or cause you to pass out. Take your time so you do not get dizzy or light-headed. If you are in pain, you may need to take pain medicine before doing incentive spirometry. It is harder to take a deep breath if you are having pain. How to use your incentive spirometer  Sit up on the edge of your bed or on a chair. Hold the incentive spirometer so that it is in an upright position. Before you use the spirometer, breathe out normally. Place the mouthpiece in your mouth. Make sure your lips are closed tightly around it. Breathe in slowly and as deeply as you can through your mouth, causing the piston or the ball to rise toward the top of the chamber. Hold your breath for 3-5 seconds, or for as long as possible. If the spirometer includes a coach indicator, use this  to guide you in breathing. Slow down your breathing if the indicator goes above the marked areas. Remove the mouthpiece from your mouth and breathe out normally. The piston or ball will return to the bottom of the chamber. Rest for a few seconds, then repeat the steps 10 or more times. Take your time and take a few normal breaths between deep breaths so that you do not get dizzy or light-headed. Do this every 1-2 hours when you are awake. If the spirometer includes a goal marker to show the highest number you have reached (best effort), use this as a goal to work toward during each repetition. After each set of 10 deep breaths, cough a few times. This will help to make sure that your lungs are clear. If you have an incision on your chest or abdomen from surgery, place a pillow or a rolled-up towel firmly against the incision when you cough. This can help to reduce pain while taking deep breaths and coughing. General tips When you are able to get out of bed: Walk around often. Continue to take deep breaths and cough in order to clear your lungs. Keep using the incentive spirometer until your health care provider says it is okay to stop using it. If you have been in the hospital, you may be told to keep using the spirometer at home. Contact a health care provider if: You are having difficulty using the spirometer.  You have trouble using the spirometer as often as instructed. Your pain medicine is not giving enough relief for you to use the spirometer as told. You have a fever. Get help right away if: You develop shortness of breath. You develop a cough with bloody mucus from the lungs. You have fluid or blood coming from an incision site after you cough. Summary An incentive spirometer is a tool that can help you learn to take long, deep breaths to keep your lungs clear and active. You may be asked to use a spirometer after a surgery, if you have a lung problem or a history of smoking, or if  you have been inactive for a long period of time. Use your incentive spirometer as instructed every 1-2 hours while you are awake. If you have an incision on your chest or abdomen, place a pillow or a rolled-up towel firmly against your incision when you cough. This will help to reduce pain. Get help right away if you have shortness of breath, you cough up bloody mucus, or blood comes from your incision when you cough. This information is not intended to replace advice given to you by your health care provider. Make sure you discuss any questions you have with your healthcare provider. Document Revised: 06/20/2019 Document Reviewed: 06/20/2019 Elsevier Patient Education  [place].

## 2020-10-05 NOTE — Assessment & Plan Note (Signed)
-   Stable interval; Breathing has improved with weight loss and regular exercise. She is complaint with Trelegy. Will repeat PFTs in 6 months. Adding incentive spirometer

## 2020-10-05 NOTE — Telephone Encounter (Signed)
-   She can resume lung cancer screening with LDCT annually (next due June 2023)

## 2020-10-05 NOTE — Progress Notes (Signed)
@Patient  ID: Grace [name], female    DOB: 04/13/1956, 65 y.o.   MRN: [id number]  No chief complaint on file.   Referring provider: Sonia Side., FNP  HPI: 65 year old female, former smoker quit in 2017 (150-pack-year history).  Past medical history significant for chronic obstructive pulmonary disease, OSA on CPAP, hypertension, PFO, coronary artery calcification, Barrett's esophagus, type 2 diabetes.  Patient of Dr. Esperanza Heir, last seen in office on 04/24/2020.  Patient was enrolled in lung cancer screening program.  She had an abnormal lung cancer screening CT which led to recommendations for PET scan imaging.  This has been ordered and scheduled for tomorrow.  At baseline she has severe COPD, pulmonary function test last completed in 2020 which revealed a ratio of 46 and an FEV1 of 0.87 L, 35% predicted with a 23% bronchodilator response, evidence of air trapping with an RV of 185% and a DLCO of 73%.  Previous [facility] pulmonary encounter: OV 03/01/2020: Here today for review of lung cancer screening CT which completed on 02/20/2020.  This revealed a lung RADS B with a 3.9 cm left lower lobe mass.  There is concern for possible under line primary bronchogenic carcinoma versus infection.  Per patient states that she is not had any significant fevers but has had increased cough and sputum production.  This was approximately 10 days ago she did have her PET scan scheduled already for tomorrow.    OV 04/24/2020: Here today for follow-up after bronchoscopy and recent CT imaging.  Patient was taken after last visit for navigational bronchoscopy.  She had a PET scan with a PET avid lesion within the chest.  Bronchoscopy was negative.  There was no malignancy identified.  Patient then subsequently had short-term CT scan follow-up on 04/19/2020.  The CT scan revealed a original 4.1 x 3.2 cm lesion which was decreased in size to 3.5 x 2.3 cm.  This was reviewed today and the office.   10/05/2020-  Interim hx  Patient presents today for 6 month follow-up. She is doing really well, breathing has improved. She is exercising and working on weight loss. She is able to do more. She very rarely requires her rescue inhaler. She is complaint with Trelegy. She is currently enrolled in randomized double blinded study for RSV vaccine (sponsor- [employer] vaccine). She received injections 2 days ago, she had fever and headache yesterday. Max temp 100.9. She reported her symptoms to study. She goes back in two weeks for follow-up visit.   She tells me that she was sick with bronchitis symptoms back in November 2021 when she had LDCT which was abnormal. Repeat CT chest 09/27/20 showed near complete resolution of previous ill defined opacity in the central LLL, likely represented pneumonia or atelectasis with minimal residual atelectasis or scarring at site. Scattered subsegmental atelectasis versus scarring in lower lungs unchanged. No discrete pulmonary mass/nodule.    Allergies  Allergen Reactions   Amlodipine     Cramps    Codeine     REACTION: headaches   Gabapentin     headache   Prozac [Fluoxetine Hcl] Other (See Comments)    Suicidal ideations    Immunization History  Administered Date(s) Administered   Influenza Split 04/25/2011   Influenza Whole 02/06/2009   Influenza, Seasonal, Injecte, Preservative Fre 05/20/2012   Influenza,inj,Quad PF,6+ Mos 03/15/2013, 01/12/2015, 01/25/2016, 12/23/2016, 01/22/2018, 01/26/2019   Influenza-Unspecified 12/13/2013, 02/21/2020   PFIZER Comirnaty(Gray Top)Covid-19 Tri-Sucrose Vaccine 08/03/2020   PFIZER(Purple Top)SARS-COV-2 Vaccination 07/07/2019, 07/28/2019, 02/21/2020  Pneumococcal Polysaccharide-23 05/20/2012   Tdap 06/12/2011   Zoster, Live 12/16/2015    Past Medical History:  Diagnosis Date   Abnormal vaginal bleeding    Anxiety    Asthma    Barrett's esophagus    with high grade dysplasia per endoscopy 05/2005 // followed by Dr. [name] ([facility])   Chronic back pain    COPD (chronic obstructive pulmonary disease) ([place])    Diabetes mellitus without complication ([place])    type 2   Duodenal ulcer disease    thought to be contributed at least partly by overuse of NSAIDs and salicylates (goody powdr)   Endometrial polyp 03/2003   s/p resection in 03/2003. 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   Migraine    Nevus    Personal history of failed conscious sedation 08/11/2011   Sleep apnea    does not use cpap   Stroke ([place]) 10/2000   Tachycardia    Thyroid disease    Tobacco abuse Jan 2014   quit    Tobacco History: Social History   Tobacco Use  Smoking Status Former   Packs/day: 3.00   Years: 50.00   Pack years: 150.00   Types: Cigarettes   Quit date: 05/10/2015   Years since quitting: 5.4  Smokeless Tobacco Never  Tobacco Comments   Counseling sheet 07-2011    Counseling given: Not Answered Tobacco comments: Counseling sheet 07-2011    Outpatient Medications Prior to Visit  Medication Sig Dispense Refill   albuterol (PROAIR HFA) 108 (90 Base) MCG/ACT inhaler INHALE 2 PUFFS EVERY 6  HOURS AS NEEDED FOR  SHORTNESS OF BREATH OR  WHEEZING (Patient taking differently: Inhale 2 puffs into the lungs every 6 (six) hours as  needed for wheezing.) 34 g 3   alum & mag hydroxide-simeth (MAALOX/MYLANTA) 200-200-20 MG/5ML suspension Take 30 mLs by mouth every 6 (six) hours as needed for indigestion or heartburn.     AMBULATORY NON FORMULARY MEDICATION Medication Name: GI cocktail  90 ML viscous lidocaine  90 ML 10 mg/5 ml Dicyclomine  270 ML Maalox  Swish and swallow 30 ML four times daily for 3 days 400 mL 0   aspirin EC 81 MG tablet Take 81 mg by mouth every 6 (six) hours as needed for moderate pain. Swallow whole.     Aspirin-Acetaminophen (GOODYS BODY PAIN PO) Take 1 Package by mouth daily as needed (pain/headache).     atorvastatin (LIPITOR) 80 MG tablet TAKE 1 TABLET BY MOUTH ONCE DAILY AT BEDTIME FOR  CHOLESTEROL (Patient taking differently: Take 80 mg by mouth at bedtime.) 90 tablet 3   Biotin 5000 MCG TABS Take 5,000 mcg by mouth daily.  Biotin w/ Vitamins C & E (HAIR/SKIN/NAILS PO) Take 1 tablet by mouth daily.     bisoprolol (ZEBETA) 10 MG tablet Take 10 mg by mouth daily.     calcium carbonate (TUMS - DOSED IN MG ELEMENTAL CALCIUM) 500 MG chewable tablet Chew 1 tablet by mouth daily as needed for indigestion or heartburn.     CINNAMON PO Take 1,800 mg by mouth daily.     COLLAGEN PO Take 1 Scoop by mouth daily.     COLLAGEN-VITAMIN C PO Take 3 tablets by mouth in the morning and at bedtime.     estradiol (ESTRACE VAGINAL) 0.1 MG/GM vaginal cream Place 1 Applicatorful vaginally at bedtime. (Patient taking differently: Place 1 Applicatorful vaginally 2 (two) times a week.) 42.5 g 12   Fluticasone-Umeclidin-Vilant (TRELEGY ELLIPTA) 100-62.5-25 MCG/INH AEPB Inhale 1 puff into the lungs daily. 180 each 3   glipiZIDE (GLUCOTROL XL) 5 MG 24 hr tablet TAKE 1 TABLET BY MOUTH  DAILY WITH BREAKFAST (Patient taking differently: Take 5 mg by mouth every evening.) 90 tablet 1   glucose blood (ONETOUCH VERIO) test strip Use to check blood sugar twice daily. Dx: E11.9 300 each 1   guaiFENesin (MUCINEX) 600 MG 12 hr tablet  Take 600 mg by mouth 2 (two) times daily as needed for to loosen phlegm or cough.     hydrALAZINE (APRESOLINE) 50 MG tablet TAKE 1 TABLET BY MOUTH 3  TIMES DAILY (Patient taking differently: Take 50 mg by mouth 3 (three) times daily.) 270 tablet 3   HYDROcodone-acetaminophen (HYCET) 7.5-325 mg/15 ml solution Take 15 mLs by mouth 4 (four) times daily as needed for moderate pain. 120 mL 0   ibuprofen (ADVIL) 200 MG tablet Take 800 mg by mouth every 8 (eight) hours as needed for moderate pain.     ipratropium-albuterol (DUONEB) 0.5-2.5 (3) MG/3ML SOLN Take 3 mLs by nebulization every 6 (six) hours as needed. (Patient taking differently: Take 3 mLs by nebulization every 6 (six) hours as needed (Asthma).) 360 mL 6   Lancets (ONETOUCH DELICA PLUS [id number]) MISC USE TO CHECK BLOOD SUGAR  TWICE DAILY 200 each 3   levothyroxine (SYNTHROID) 137 MCG tablet TAKE 1 TABLET BY MOUTH  DAILY BEFORE BREAKFAST (Patient taking differently: Take 137 mcg by mouth daily before breakfast.) 90 tablet 2   losartan (COZAAR) 100 MG tablet TAKE 1 TABLET BY MOUTH  DAILY (Patient taking differently: Take 100 mg by mouth every evening.) 90 tablet 3   Magnesium 250 MG TABS Take 250 mg by mouth 2 (two) times daily.     Melatonin 10 MG TABS Take 10 mg by mouth at bedtime as needed (sleep).     omeprazole (PRILOSEC) 40 MG capsule Take 1 capsule (40 mg total) by mouth 2 (two) times daily before a meal. Take 30- 60 min before your first and last meals of the day 60 capsule 11   Respiratory Therapy Supplies (NEBULIZER/TUBING/MOUTHPIECE) KIT 1 application by Does not apply route every 6 (six) hours as needed. FAX TO [phone] 1 kit 11   valACYclovir (VALTREX) 500 MG tablet TAKE 1 TABLET BY MOUTH  DAILY (Patient taking differently: Take 500 mg by mouth daily.) 90 tablet 3   sucralfate (CARAFATE) 1 GM/10ML suspension Take 10 mLs (1 g total) by mouth 4 (four) times daily. 1200 mL 2   No facility-administered medications prior to visit.     Review of Systems  Review of Systems  Constitutional: Negative.   HENT: Negative.    Respiratory:  Negative.      Physical Exam  BP 108/80 (BP Location: Left Arm, Patient Position: Sitting, Cuff Size: Normal)   Pulse 70   Temp 97.8 F (36.6 C) (Oral)   Ht 5' 3"  (1.6 m)   Wt 169 lb (76.7 kg)   LMP 02/27/2014   SpO2 96%   BMI 29.94 kg/m  Physical Exam Constitutional:      Appearance: Normal appearance.  HENT:     Head: Normocephalic and atraumatic.  Cardiovascular:     Rate and Rhythm: Normal rate and regular rhythm.  Pulmonary:     Effort: Pulmonary effort is normal.     Breath sounds: Normal breath sounds. No wheezing, rhonchi or rales.     Comments: CTA Musculoskeletal:        General: Normal range of motion.  Skin:    General: Skin is warm and dry.  Neurological:     General: No focal deficit present.     Mental Status: She is alert and oriented to person, place, and time. Mental status is at baseline.  Psychiatric:        Mood and Affect: Mood normal.        Behavior: Behavior normal.        Thought Content: Thought content normal.        Judgment: Judgment normal.     Lab Results:  CBC    Component Value Date/Time   WBC 7.7 05/29/2020 1600   RBC 4.42 05/29/2020 1600   HGB 13.4 05/29/2020 1600   HGB 12.1 10/08/2015 1407   HCT 40.7 05/29/2020 1600   HCT 38.1 10/08/2015 1407   PLT 223.0 05/29/2020 1600   PLT 633 (H) 10/08/2015 1407   MCV 92.1 05/29/2020 1600   MCV 90 10/08/2015 1407   MCH 30.2 03/15/2020 0703   MCHC 32.9 05/29/2020 1600   RDW 14.9 05/29/2020 1600   RDW 14.8 10/08/2015 1407   LYMPHSABS 1,778 09/28/2019 1211   LYMPHSABS 1.7 10/08/2015 1407   MONOABS 0.7 02/07/2019 1548   EOSABS 299 09/28/2019 1211   EOSABS 0.2 10/08/2015 1407   BASOSABS 26 09/28/2019 1211   BASOSABS 0.0 10/08/2015 1407    BMET    Component Value Date/Time   NA 138 05/29/2020 1600   NA 134 (A) 10/19/2015 0000   K 4.0 05/29/2020 1600   CL 102 05/29/2020  1600   CO2 28 05/29/2020 1600   GLUCOSE 106 (H) 05/29/2020 1600   BUN 22 05/29/2020 1600   BUN 14 10/19/2015 0000   CREATININE 0.80 05/29/2020 1600   CREATININE 0.76 09/28/2019 1211   CALCIUM 9.4 05/29/2020 1600   GFRNONAA >60 03/15/2020 0703   GFRNONAA 83 09/28/2019 1211   GFRAA 96 09/28/2019 1211    BNP    Component Value Date/Time   BNP 370.9 (H) 09/25/2015 1825    ProBNP No results found for: PROBNP  Imaging: CT Chest Wo Contrast  Result Date: 09/27/2020 CLINICAL DATA:  Follow-up LEFT lower lobe lung nodule EXAM: CT CHEST WITHOUT CONTRAST TECHNIQUE: Multidetector CT imaging of the chest was performed following the standard protocol without IV contrast. Sagittal and coronal MPR images reconstructed from axial data set. COMPARISON:  04/19/2020 FINDINGS: Cardiovascular: Atherosclerotic calcifications aorta, proximal great vessels, and coronary arteries. Ascending thoracic aorta upper normal caliber. Heart unremarkable. No pericardial effusion. Mediastinum/Nodes: Esophagus normal appearance. No thoracic adenopathy. Base of cervical region normal appearance. Lungs/Pleura: Previously identified ill-defined opacity in the central LEFT lower lobe has nearly completely resolved since the prior study,  with minimal residual atelectasis or scarring at site. This likely represented atelectasis or consolidation. Scattered subsegmental atelectasis versus scarring in lower lungs otherwise unchanged. No new infiltrate, pleural effusion, or pneumothorax. No discrete pulmonary mass/nodule. Upper Abdomen: Post cholecystectomy. Visualized upper abdomen unremarkable. Musculoskeletal: Unremarkable IMPRESSION: Near complete resolution of previously identified ill-defined opacity in the central LEFT lower lobe, likely represented pneumonia or atelectasis, with minimal residual atelectasis or scarring at site. Scattered subsegmental atelectasis versus scarring in lower lungs otherwise unchanged. Scattered  atherosclerotic calcifications including coronary arteries. Aortic Atherosclerosis (ICD10-I70.0). Electronically Signed   By: Lavonia Dana M.D.   On: 09/27/2020 10:05     Assessment & Plan:   Chronic obstructive pulmonary disease (HCC) - Stable interval; Breathing has improved with weight loss and regular exercise. She is complaint with Trelegy. Will repeat PFTs in 6 months. Adding incentive spirometer  Former heavy tobacco smoker - CT chest 09/27/20 showed near complete resolution of previous ill defined opacity in the central LLL with minimal residual atelectasis or scarring - She can resume lung cancer screening with LDCT annually      Martyn Ehrich, NP 10/05/2020

## 2020-10-08 NOTE — Telephone Encounter (Signed)
Order placed for f/u low dose screening chest CT for June 2023. Pt will be called closer to that time to schedule.

## 2020-10-11 NOTE — Telephone Encounter (Signed)
Pt seen 6/24 by Derl Barrow, NP. Will close encounter.

## 2020-10-12 ENCOUNTER — Other Ambulatory Visit: Payer: Self-pay | Admitting: Nurse Practitioner

## 2020-10-12 DIAGNOSIS — B009 Herpesviral infection, unspecified: Secondary | ICD-10-CM

## 2020-10-30 ENCOUNTER — Ambulatory Visit: Payer: Medicare Other | Admitting: Endocrinology

## 2020-11-29 ENCOUNTER — Encounter: Payer: Self-pay | Admitting: Endocrinology

## 2020-11-29 ENCOUNTER — Other Ambulatory Visit: Payer: Self-pay

## 2020-11-29 ENCOUNTER — Ambulatory Visit (INDEPENDENT_AMBULATORY_CARE_PROVIDER_SITE_OTHER): Payer: Medicare Other | Admitting: Endocrinology

## 2020-11-29 VITALS — BP 140/70 | HR 72 | Ht 63.0 in | Wt 165.8 lb

## 2020-11-29 DIAGNOSIS — E038 Other specified hypothyroidism: Secondary | ICD-10-CM | POA: Diagnosis not present

## 2020-11-29 DIAGNOSIS — E042 Nontoxic multinodular goiter: Secondary | ICD-10-CM

## 2020-11-29 LAB — T4, FREE: Free T4: 1.24 ng/dL (ref 0.60–1.60)

## 2020-11-29 LAB — TSH: TSH: 2.52 u[IU]/mL (ref 0.35–5.50)

## 2020-11-29 NOTE — Patient Instructions (Addendum)
Blood tests are requested for you today.  We'll let you know about the results  Reducing the glipizide might help the weight.    Let's recheck the ultrasound.  you will receive a phone call, about a day and time for an appointment.

## 2020-11-29 NOTE — Progress Notes (Signed)
Subjective:    Patient ID: Grace [name], female    DOB: July 12, 1955, 65 y.o.   MRN: [id number]  HPI Pt is referred by Dr Posey Pronto, for hypothyroidism.  Pt reports hypothyroidism was dx'ed in 2002.  she has been on prescribed thyroid hormone therapy since dx.  she has never taken kelp or any other type of non-prescribed thyroid product.  she has never had thyroid surgery, or XRT to the neck.  she has never been on amiodarone or lithium.  She takes biotin, but none x a few days.  She has dry skin, cold intolerance, constipation, and hair loss.  She has been on her current 150 mcg/d, x 2 weeks.  She has difficulty losing weight.  Past Medical History:  Diagnosis Date   Abnormal vaginal bleeding    Anxiety    Asthma    Barrett's esophagus    with high grade dysplasia per endoscopy 05/2005 // followed by Dr. [name] ([facility])   Chronic back pain    COPD (chronic obstructive pulmonary disease) ([place])    Diabetes mellitus without complication ([place])    type 2   Duodenal ulcer disease    thought to be contributed at least partly by overuse of NSAIDs and salicylates (goody powdr)   Endometrial polyp 03/2003   s/p resection in 03/2003. 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   Migraine    Nevus    Personal  history of failed conscious sedation 08/11/2011   Sleep apnea    does not use cpap   Stroke ([place]) 10/2000   Tachycardia    Thyroid disease    Tobacco abuse Jan 2014   quit    Past Surgical History:  Procedure Laterality Date   ADENOIDECTOMY     APPENDECTOMY  2017   BALLOON DILATION N/A 06/14/2020   Procedure: BALLOON DILATION;  Surgeon: Rush Landmark Telford Nab., MD;  Location: [place];  Service: Gastroenterology;  Laterality: N/A;   BIOPSY  02/15/2020   Procedure: BIOPSY;  Surgeon: Arta Silence, MD;  Location: [facility];  Service: Endoscopy;;   BIOPSY  06/14/2020   Procedure: BIOPSY;  Surgeon: Irving Copas., MD;  Location: [place];  Service: Gastroenterology;;   BRONCHIAL BIOPSY  03/15/2020   Procedure: BRONCHIAL BIOPSIES;  Surgeon: Garner Nash, DO;  Location: [place] [facility];  Service: Pulmonary;;   BRONCHIAL BRUSHINGS  03/15/2020   Procedure: BRONCHIAL BRUSHINGS;  Surgeon: Garner Nash, DO;  Location: [place];  Service: Pulmonary;;   BRONCHIAL NEEDLE ASPIRATION BIOPSY  03/15/2020   Procedure: BRONCHIAL NEEDLE ASPIRATION BIOPSIES;  Surgeon: Garner Nash, DO;  Location: [place];  Service: Pulmonary;;   BRONCHIAL WASHINGS  03/15/2020   Procedure:  BRONCHIAL WASHINGS;  Surgeon: Garner Nash, DO;  Location: [place];  Service: Pulmonary;;   CHOLECYSTECTOMY     COLONOSCOPY  02/07/2010   COLONOSCOPY WITH PROPOFOL N/A 02/15/2020   Procedure: COLONOSCOPY WITH PROPOFOL;  Surgeon: Arta Silence, MD;  Location: [facility];  Service: Endoscopy;  Laterality: N/A;   DILATION AND CURETTAGE OF UTERUS     ENDOSCOPIC MUCOSAL RESECTION N/A 06/14/2020   Procedure: ENDOSCOPIC MUCOSAL RESECTION;  Surgeon: Rush Landmark Telford Nab., MD;  Location: [place];  Service: Gastroenterology;  Laterality: N/A;   ESOPHAGOGASTRODUODENOSCOPY (EGD) WITH PROPOFOL N/A 02/15/2020   Procedure: ESOPHAGOGASTRODUODENOSCOPY (EGD) WITH PROPOFOL;  Surgeon: Arta Silence, MD;   [facility];  Service: Endoscopy;  Laterality: N/A;   ESOPHAGOGASTRODUODENOSCOPY (EGD) WITH PROPOFOL N/A 06/14/2020   Procedure: ESOPHAGOGASTRODUODENOSCOPY (EGD) WITH PROPOFOL;  Surgeon: Rush Landmark Telford Nab., MD;  Location: [place];  Service: Gastroenterology;  Laterality: N/A;   FOREIGN BODY REMOVAL  06/14/2020   Procedure: FOREIGN BODY REMOVAL;  Surgeon: Rush Landmark, Telford Nab., MD;  Location: [facility];  Service: Gastroenterology;;   HYSTEROSCOPY     LAPAROSCOPIC APPENDECTOMY N/A 09/23/2015   Procedure: APPENDECTOMY LAPAROSCOPIC;  Surgeon: Georganna Skeans, MD;  Location: [facility] OR;  Service: General;  Laterality: N/A;   [place], 1997   x 2 - In 1997, noted to have extensive pelvic adhesions and BL tubal obstruction   POLYPECTOMY  02/15/2020   Procedure: POLYPECTOMY;  Surgeon: Arta Silence, MD;  Location: [facility];  Service: Endoscopy;;   TONSILLECTOMY     TRANSTHORACIC ECHOCARDIOGRAM  06/2014    EF 55-60%.  No RWMA. Gr 1 DD. Cannot R/o PFO (consider bubble study).   UPPER ESOPHAGEAL ENDOSCOPIC ULTRASOUND (EUS) N/A 06/14/2020   Procedure: UPPER ESOPHAGEAL ENDOSCOPIC ULTRASOUND (EUS);  Surgeon: Irving Copas., MD;  Location: [place];  Service: Gastroenterology;  Laterality: N/A;   UPPER GASTROINTESTINAL ENDOSCOPY  12/18/2009   VIDEO BRONCHOSCOPY WITH ENDOBRONCHIAL NAVIGATION N/A 03/15/2020   Procedure: VIDEO BRONCHOSCOPY WITH ENDOBRONCHIAL NAVIGATION;  Surgeon: Garner Nash, DO;  Location: [place];  Service: Pulmonary;  Laterality: N/A;   VIDEO BRONCHOSCOPY WITH ENDOBRONCHIAL ULTRASOUND  03/15/2020   Procedure: VIDEO BRONCHOSCOPY WITH ENDOBRONCHIAL ULTRASOUND;  Surgeon: Garner Nash, DO;  Location: [facility];  Service: Pulmonary;;   WISDOM TOOTH EXTRACTION      Social History   Socioeconomic History   Marital status: Divorced    Spouse name: Not on file   Number of children: 0   Years of education: 12   Highest education level: High  school graduate  Occupational History   Occupation: Electronic record    Comment: [employer]  Tobacco Use   Smoking status: Former    Packs/day: 3.00    Years: 50.00    Pack years: 150.00    Types: Cigarettes    Quit date: 05/10/2015    Years since quitting: 5.5   Smokeless tobacco: Never   Tobacco comments:    Counseling sheet 07-2011   Vaping Use   Vaping Use: Never used  Substance and Sexual Activity   Alcohol use: No    Alcohol/week: 0.0 standard drinks   Drug use: No   Sexual activity: Never    Birth control/protection: Post-menopausal  Other Topics Concern   Not on file  Social History Narrative   Financial assistance approved for 100% discount at [facility] and has [facility] card per [employer]   01/15/2010      Diet- N/A   Caffeine- Goody powders   Married-Divorced   House-1 and  lives with mother   Pets-2   Current/past profession-[employer]   Exercise-No   Living will-No   DNR-No   POA/HPOA-No         Social Determinants of Health   Financial Resource Strain: Not on file  Food Insecurity: Not on file  Transportation Needs: Not on file  Physical Activity: Not on file  Stress: Not on file  Social Connections: Not on file  Intimate Partner Violence: Not on file    Current Outpatient Medications on File Prior to Visit  Medication Sig Dispense Refill   albuterol (PROAIR HFA) 108 (90 Base) MCG/ACT inhaler INHALE 2 PUFFS EVERY 6  HOURS AS NEEDED FOR  SHORTNESS OF BREATH OR  WHEEZING (Patient taking differently: Inhale 2 puffs into the lungs every 6 (six) hours as needed for wheezing.) 34 g 3   alum & mag hydroxide-simeth (MAALOX/MYLANTA) 200-200-20 MG/5ML suspension Take 30 mLs by mouth every 6 (six) hours as needed for indigestion or heartburn.     AMBULATORY NON FORMULARY MEDICATION Medication Name: GI cocktail  90 ML viscous lidocaine  90 ML 10 mg/5 ml Dicyclomine  270 ML Maalox  Swish and swallow 30 ML four times daily for 3 days 400 mL 0   aspirin EC 81 MG tablet  Take 81 mg by mouth every 6 (six) hours as needed for moderate pain. Swallow whole.     Aspirin-Acetaminophen (GOODYS BODY PAIN PO) Take 1 Package by mouth daily as needed (pain/headache).     atorvastatin (LIPITOR) 80 MG tablet TAKE 1 TABLET BY MOUTH ONCE DAILY AT BEDTIME FOR  CHOLESTEROL (Patient taking differently: Take 80 mg by mouth at bedtime.) 90 tablet 3   Biotin 5000 MCG TABS Take 5,000 mcg by mouth daily.     Biotin w/ Vitamins C & E (HAIR/SKIN/NAILS PO) Take 1 tablet by mouth daily.     bisoprolol (ZEBETA) 10 MG tablet Take 10 mg by mouth daily.     calcium carbonate (TUMS - DOSED IN MG ELEMENTAL CALCIUM) 500 MG chewable tablet Chew 1 tablet by mouth daily as needed for indigestion or heartburn.     CINNAMON PO Take 1,800 mg by mouth daily.     COLLAGEN PO Take 1 Scoop by mouth daily.     COLLAGEN-VITAMIN C PO Take 3 tablets by mouth in the morning and at bedtime.     estradiol (ESTRACE VAGINAL) 0.1 MG/GM vaginal cream Place 1 Applicatorful vaginally at bedtime. (Patient taking differently: Place 1 Applicatorful vaginally 2 (two) times a week.) 42.5 g 12   Fluticasone-Umeclidin-Vilant (TRELEGY ELLIPTA) 100-62.5-25 MCG/INH AEPB Inhale 1 puff into the lungs daily. 180 each 3   glipiZIDE (GLUCOTROL XL) 5 MG 24 hr tablet TAKE 1 TABLET BY MOUTH  DAILY WITH BREAKFAST (Patient taking differently: Take 5 mg by mouth every evening.) 90 tablet 1   glucose blood (ONETOUCH VERIO) test strip Use to check blood sugar twice daily. Dx: E11.9 300 each 1   guaiFENesin (MUCINEX) 600 MG 12 hr tablet Take 600 mg by mouth 2 (two) times daily as needed for to loosen phlegm or cough.     hydrALAZINE (APRESOLINE) 50 MG tablet TAKE 1 TABLET BY MOUTH 3  TIMES DAILY (Patient taking differently: Take 50 mg by mouth 3 (three) times daily.) 270 tablet 3   HYDROcodone-acetaminophen (HYCET) 7.5-325 mg/15 ml solution Take 15 mLs by mouth 4 (four) times daily as needed for moderate pain. 120 mL 0   ibuprofen (ADVIL) 200 MG  tablet Take 800 mg by  mouth every 8 (eight) hours as needed for moderate pain.     ipratropium-albuterol (DUONEB) 0.5-2.5 (3) MG/3ML SOLN Take 3 mLs by nebulization every 6 (six) hours as needed. (Patient taking differently: Take 3 mLs by nebulization every 6 (six) hours as needed (Asthma).) 360 mL 6   Lancets (ONETOUCH DELICA PLUS [id number]) MISC USE TO CHECK BLOOD SUGAR  TWICE DAILY 200 each 3   levothyroxine (SYNTHROID) 137 MCG tablet TAKE 1 TABLET BY MOUTH  DAILY BEFORE BREAKFAST (Patient taking differently: Take 137 mcg by mouth daily before breakfast.) 90 tablet 2   losartan (COZAAR) 100 MG tablet TAKE 1 TABLET BY MOUTH  DAILY (Patient taking differently: Take 100 mg by mouth every evening.) 90 tablet 3   Magnesium 250 MG TABS Take 250 mg by mouth 2 (two) times daily.     Melatonin 10 MG TABS Take 10 mg by mouth at bedtime as needed (sleep).     omeprazole (PRILOSEC) 40 MG capsule Take 1 capsule (40 mg total) by mouth 2 (two) times daily before a meal. Take 30- 60 min before your first and last meals of the day 60 capsule 11   Respiratory Therapy Supplies (NEBULIZER/TUBING/MOUTHPIECE) KIT 1 application by Does not apply route every 6 (six) hours as needed. FAX TO [phone] 1 kit 11   valACYclovir (VALTREX) 500 MG tablet TAKE 1 TABLET BY MOUTH  DAILY (Patient taking differently: Take 500 mg by mouth daily.) 90 tablet 3   sucralfate (CARAFATE) 1 GM/10ML suspension Take 10 mLs (1 g total) by mouth 4 (four) times daily. 1200 mL 2   [DISCONTINUED] ipratropium (ATROVENT HFA) 17 MCG/ACT inhaler Inhale 2 puffs into the lungs 4 (four) times daily. 1 Inhaler 6   No current facility-administered medications on file prior to visit.    Allergies  Allergen Reactions   Amlodipine     Cramps    Codeine     REACTION: headaches   Gabapentin     headache   Prozac [Fluoxetine Hcl] Other (See Comments)    Suicidal ideations    Family History  Problem Relation Age of Onset   COPD Mother    Heart  disease Father 75       had 5 heart attacks, first in late 5s   COPD Father    Colon cancer Neg Hx    Esophageal cancer Neg Hx    Inflammatory bowel disease Neg Hx    Liver disease Neg Hx    Pancreatic cancer Neg Hx    Rectal cancer Neg Hx    Stomach cancer Neg Hx    Thyroid disease Neg Hx     BP 140/70 (BP Location: Right Arm, Patient Position: Sitting, Cuff Size: Normal)   Pulse 72   Ht 5' 3"  (1.6 m)   Wt 165 lb 12.8 oz (75.2 kg)   LMP 02/27/2014   SpO2 96%   BMI 29.37 kg/m     Review of Systems denies depression, memory loss, numbness.      Objective:   Physical Exam VS: see vs page GEN: no distress HEAD: head: no deformity eyes: no periorbital swelling, no proptosis external nose and ears are normal NECK: supple, thyroid is not enlarged CHEST WALL: no deformity LUNGS: clear to auscultation CV: reg rate and rhythm, no murmur.  MUSCULOSKELETAL: gait is normal and steady EXTEMITIES: no deformity.  1+ bilat leg edema NEURO:  readily moves all 4's.  sensation is intact to touch on all 4's SKIN:  Normal texture and temperature.  No rash or suspicious lesion is visible.   NODES:  None palpable at the neck PSYCH: alert, well-oriented.  Does not appear anxious nor depressed.   outside test results are reviewed: TSH=0.02  Pt says A1c was recently 5.3%  CT (2004): DIFFUSE NODULARITY AND INHOMOGENEOUS ULTRASOUND TEXTURE THROUGHOUT THE RIGHT AND LEFT LOBES OF THE  THYROID CONSISTENT WITH A MULTINODULAR GOITER WITHOUT EVIDENCE FOR DOMINANT MASS.  I have reviewed outside records, and summarized: Pt was noted to have abnormal TFT, and referred here.  DM was also rx'ed with glipizide.       Assessment & Plan:  Type 2 DM: overcontrolled Hypothyroidism, due for recheck MNG, due for recheck  Patient Instructions  Blood tests are requested for you today.  We'll let you know about the results  Reducing the glipizide might help the weight.    Let's recheck the  ultrasound.  you will receive a phone call, about a day and time for an appointment.

## 2021-01-04 ENCOUNTER — Other Ambulatory Visit: Payer: Self-pay | Admitting: Family Medicine

## 2021-01-04 DIAGNOSIS — E2839 Other primary ovarian failure: Secondary | ICD-10-CM

## 2021-02-13 ENCOUNTER — Other Ambulatory Visit: Payer: Self-pay

## 2021-02-13 NOTE — Patient Outreach (Signed)
Aging Gracefully Program  02/13/2021  Grace [name] 1956-03-08 [id number]  [facility] Evaluation Interviewer attempted to call patient on today regarding Aging Gracefully referral. No answer from patient after multiple rings. CMA left confidential voicemail for patient to return call.  Will attempt to call back within 1 week.   [place] Management Assistant [phone]

## 2021-02-19 ENCOUNTER — Other Ambulatory Visit: Payer: Self-pay

## 2021-02-19 NOTE — Patient Outreach (Signed)
Aging Gracefully Program  02/19/2021  Grace [name] 30-Jun-1955 [id number]  [facility] Evaluation Interviewer made contact with patient. Aging Gracefully initial survey completed.   Interviewer will send referral to Tomasa Rand, RN and OT for follow up.  [place] Management Assistant [phone]

## 2021-02-21 ENCOUNTER — Other Ambulatory Visit: Payer: Medicare Other

## 2021-02-25 ENCOUNTER — Ambulatory Visit
Admission: RE | Admit: 2021-02-25 | Discharge: 2021-02-25 | Disposition: A | Discharge status: Home / Self Care | Payer: Medicare Other | Source: Ambulatory Visit | Attending: Family Medicine | Admitting: Family Medicine

## 2021-02-25 ENCOUNTER — Other Ambulatory Visit: Payer: Self-pay

## 2021-02-25 DIAGNOSIS — N6310 Unspecified lump in the right breast, unspecified quadrant: Secondary | ICD-10-CM

## 2021-03-05 ENCOUNTER — Other Ambulatory Visit: Payer: Self-pay

## 2021-03-05 ENCOUNTER — Other Ambulatory Visit: Payer: Self-pay | Admitting: Occupational Therapy

## 2021-03-05 NOTE — Patient Outreach (Signed)
Aging Gracefully Program  OT Initial Visit  03/05/2021  Grace [name] Aug 27, 1955 [id number]  Visit:  1- Initial Visit  Start Time:  0900 End Time:  8676 Total Minutes:  32  CCAP: Typical Daily Routine: Typical Daily Routine::  (She gets ups, feeds cats, takes one of her meds, cleans liter box, drinks a shake (tries to do Keto), looks at emails on the computer, does some housework. Does not watch much tv.) What Types Of Care Problems Are You Having Throughout The Day?:  (When my back hurts it makes it hard to do some tasks, so I have to sit down and rest.) What Kind Of Help Do You Receive?:  (none) What Do You Think Would Make Everyday Life Easier For You?:  (heat that works correctly, a walk in shower, dryer in  house, rails on front and back steps) What Is A Good Day Like?: When my back doesnt hurt and its warm outside so I can open the doors and windows What Is A Bad Day Like?: When my back hurts, when it is cold outside Patient Reported Equipment: Patient Reported Equipment Currently Used:  (NOne) Other Equipment::  (She has a RW, rollator, SPC, rolling cart, shower seat without back) Functional Mobility-Stooping, Crouching, Kneeling To Retreive Item: Stooping, Crouching, or Kneeling To Retrieve Item: Moderate Difficulty (Can stoop but not crouch or kneel) Do You:: Use Personal Assistance (reacher sometimes) Intervention: No Functional Mobility-Move In And Out Of Bath/Shower: Move In And Out Of A Bath/Shower: A Little Difficulty Do You:: No Device/No Assistance Importance Of Learning New Strategies:: Moderate Safety: A Little Risk Efficiency: Somewhat Intervention: Yes Other Comments:: would benefit from a walk in shower Activities of Daily Living-Rest And Sleep: Rest and Sleep: Moderate Difficulty Do You::  (takes supplements and sometimes Benadryl, but still has trouble sleeping) Importance Of Learning New Strategies: A Little Intervention: Yes Instrumental Activities  of Daily Living-Washing Dishes By Hand While Standing At The Sink: Washing Dishes By Hand While Standing At The Sink: A Little Difficulty Do You:: No Device/No Assistance Importance Of Learning New Strategies: A Little Other Comments:: props on sink Efficiency: Somewhat Intervention:  (maybe a stool or sit on rollator seat)  Readiness To Change Score:  Readiness to Change Score: 10  Home Environment Assessment: Outside Home Entry:: Rails on front steps are not secure. Needs rails on back steps. Back entrance (which she uses the most) needs a level walking surface. Front door is coming apart and there is a gap under the door (both main door and storm door). Back door glass is broken out. Front and back door locks do not work well (not as secure as she wants them to bed) Dining Room:: There are holes in the linoleum that she has rugs or duct tape over to keep from catching her feet on them Kitchen:: There are holes in the linoleum that she has rugs or duct tape over to keep from catching her feet on them Bathroom:: Has a tub shower combination (could benefit from a walk in shower), She does not have an outlet in her bathroom that works, so she runs a drop cord into bathroom to run heater. SInk facuet leaks where it attaches to sink base so client does not use her bathroom sink Master Bedroom:: There is a large hole in carpet between hallway and bedroom that is a tripping hazzard Laundry:: Her washer is in her kitchen but her dryer is out in the utility room. Dryer used to be in [employer],  would be better to have dryer back in kitchen. Smoke/CO2 Detector:: Has one Veterinary surgeon:: Does not have on Mailbox:: at front door Other Home Environment Concerns:: Several outlets and light switches do not work per client, roof leaks--several rooms show this on the walls,  mold in bedrrom that she treated by spraying with clorox, sideing on back of house is busted and one part of piece missing, She would  like her clothes line restrung.heat is not efficient and she is worried about her air conditioner as well (says it is old)  Patient Education: Education Provided: Yes Education Details: Getting up from a fall handout and Safety at home Checklist Person(s) Educated: Patient Comprehension:  (Client to look over material and we will discuss next visit)  Goals:  Goals Addressed               This Visit's Progress     Patient Stated (pt-stated)        She would like to feel more safe in bathroom (walk in shower with grab bars, outlet in bathroom, heater fixed potentially, faucet fixed so does not leak so she can use sink in bathroom)      Patient Stated        She would like to feel more safe getting in and out of her house (pathway at back that is level, rails at back door, and rails at front door secured)      Patient Stated        She would like dryer to be more accessible (move dryer back into kitchen), would also like clothes line strung.      Patient Stated        She would like to feel more secure in her home ( new front door with new locks so there are not any openings under the storm and main door;new locks at back door, perhaps new door at back or glass replaced in current door.        Post Clinical Reasoning: Clinician View Of Client Situation:: Ms. Esperanza gets around and does very well for herself. She mainly stays at home having groceries and other items delivered. She has several cats in the house that she takes care of. She has several issues with home she would like to have looked at as well as some home modifcations that would make her life easier and safer. Client View Of His/Her Situation:: Ms. Vialpando feels like she does well forself with back pain slowing her down for some activities.  [place], OTR/L Aging Gracefully [phone]

## 2021-03-05 NOTE — Patient Instructions (Signed)
Goals Addressed               This Visit's Progress     Patient Stated (pt-stated)        She would like to feel more safe in bathroom (walk in shower with grab bars, outlet in bathroom, heater fixed potentially, faucet fixed so does not leak so she can use sink in bathroom)      Patient Stated        She would like to feel more safe getting in and out of her house (pathway at back that is level, rails at back door, and rails at front door secured)      Patient Stated        She would like dryer to be more accessible (move dryer back into kitchen), would also like clothes line strung.      Patient Stated        She would like to feel more secure in her home ( new front door with new locks so there are not any openings under the storm and main door;new locks at back door, perhaps new door at back or glass replaced in current door.

## 2021-03-20 ENCOUNTER — Telehealth: Payer: Self-pay

## 2021-03-20 NOTE — Patient Outreach (Signed)
Aging Gracefully Program  03/20/2021  MIKELE SIFUENTES [birth year] [id number]   Placed call to patient to scheduled home visit. Successful call. Initial home visit planned for 04/03/2021 at 1130 am Confirmed address  Tomasa Rand RN, BSN, CEN RN Case Freight forwarder for Aging Museum/gallery conservator Mobile: [phone]

## 2021-04-03 ENCOUNTER — Other Ambulatory Visit: Payer: Self-pay

## 2021-04-03 NOTE — Patient Outreach (Signed)
Aging Gracefully Program  RN Visit  04/03/2021  Grace [name] 11-21-1955 [id number]  Visit:   RN home visit #1  Start Time:   1130 End Time:   1250 Total Minutes:   80  Readiness To Change Score:     Universal RN Interventions: Calendar Distribution: Yes Medications: Yes Medication Changes: Yes Mood: Yes Pain: Yes PCP Advocacy/Support: No Fall Prevention: Yes Incontinence: Yes Clinician View Of Client Situation: 4 cats. Patient is awake and alert. talkative.   denies any family. Client View Of His/Her Situation: Reports she has Copd and bak pain.  Quit smoking 5 years. reports she headadhes daily.  Healthcare Provider Communication: Did Higher education careers adviser With [employer] Provider?: No According to Client, Did PCP Report Communication With An Aging Gracefully RN?: No  Clinician View of Client Situation: Clinician View Of Client Situation: 4 cats. Patient is awake and alert. talkative.   denies any family. Client's View of His/Her Situation: Client View Of His/Her Situation: Reports she has Copd and bak pain.  Quit smoking 5 years. reports she headadhes daily.  Medication Assessment: Do You Have Any Problems Paying For Medications?: No Where Does Client Store Medications?: Kitchen Table Can Client Read Pill Bottles?: Yes Does Client Use A Pillbox?: No Does Anyone Assist Client In Taking Medications?: No Do You Take Vitamin D?: No Does Client Have Any Questions Or Concerns About Medictions?: No Is Client Complaining Of Any Symptoms That Could Be Side Effects To Medications?: No Any Possible Changes In Medication Regimen?: No  Outpatient Encounter Medications as of 04/03/2021  Medication Sig   albuterol (PROAIR HFA) 108 (90 Base) MCG/ACT inhaler INHALE 2 PUFFS EVERY 6  HOURS AS NEEDED FOR  SHORTNESS OF BREATH OR  WHEEZING (Patient taking differently: Inhale 2 puffs into the lungs every 6 (six) hours as needed for wheezing.)   alum & mag hydroxide-simeth  (MAALOX/MYLANTA) 200-200-20 MG/5ML suspension Take 30 mLs by mouth every 6 (six) hours as needed for indigestion or heartburn.   AMBULATORY NON FORMULARY MEDICATION Medication Name: GI cocktail  90 ML viscous lidocaine  90 ML 10 mg/5 ml Dicyclomine  270 ML Maalox  Swish and swallow 30 ML four times daily for 3 days   Aspirin-Acetaminophen (GOODYS BODY PAIN PO) Take 1 Package by mouth daily as needed (pain/headache).   atorvastatin (LIPITOR) 80 MG tablet TAKE 1 TABLET BY MOUTH ONCE DAILY AT BEDTIME FOR  CHOLESTEROL (Patient taking differently: Take 80 mg by mouth at bedtime.)   bisoprolol (ZEBETA) 10 MG tablet Take 10 mg by mouth daily.   calcium carbonate (TUMS - DOSED IN MG ELEMENTAL CALCIUM) 500 MG chewable tablet Chew 1 tablet by mouth daily as needed for indigestion or heartburn.   CINNAMON PO Take 1,200 mg by mouth daily.   COLLAGEN PO Take 1 Scoop by mouth daily.   COLLAGEN-VITAMIN C PO Take 3 tablets by mouth in the morning and at bedtime.   estradiol (ESTRACE VAGINAL) 0.1 MG/GM vaginal cream Place 1 Applicatorful vaginally at bedtime. (Patient taking differently: Place 1 Applicatorful vaginally 2 (two) times a week.)   Fluticasone-Umeclidin-Vilant (TRELEGY ELLIPTA) 100-62.5-25 MCG/INH AEPB Inhale 1 puff into the lungs daily.   glipiZIDE (GLUCOTROL XL) 5 MG 24 hr tablet TAKE 1 TABLET BY MOUTH  DAILY WITH BREAKFAST (Patient taking differently: Take 5 mg by mouth every evening.)   glucose blood (ONETOUCH VERIO) test strip Use to check blood sugar twice daily. Dx: E11.9   guaiFENesin (MUCINEX) 600 MG 12 hr tablet Take 600 mg by  mouth 2 (two) times daily as needed for to loosen phlegm or cough.   hydrALAZINE (APRESOLINE) 50 MG tablet TAKE 1 TABLET BY MOUTH 3  TIMES DAILY (Patient taking differently: Take 50 mg by mouth 3 (three) times daily.)   ibuprofen (ADVIL) 200 MG tablet Take 800 mg by mouth every 8 (eight) hours as needed for moderate pain.   Lancets (ONETOUCH DELICA PLUS HTDSKA76O) MISC  USE TO CHECK BLOOD SUGAR  TWICE DAILY   levothyroxine (SYNTHROID) 137 MCG tablet TAKE 1 TABLET BY MOUTH  DAILY BEFORE BREAKFAST (Patient taking differently: Take 137 mcg by mouth daily before breakfast.)   losartan (COZAAR) 100 MG tablet TAKE 1 TABLET BY MOUTH  DAILY (Patient taking differently: Take 100 mg by mouth every evening.)   Magnesium 250 MG TABS Take 400 mg by mouth 2 (two) times daily.   Melatonin 10 MG TABS Take 10 mg by mouth at bedtime as needed (sleep).   Misc Natural Products (RESVERATROL ULTRA PO) Take 1,200 mg by mouth 2 (two) times daily.   Multiple Vitamins-Minerals (HAIR SKIN AND NAILS FORMULA) TABS Take 3 tablets by mouth daily.   Multiple Vitamins-Minerals (ONE-A-DAY WOMENS) tablet Take 1 tablet by mouth daily.   Non Gelatin Capsules, Empty, (CAPSULE 0 CLEAR VEGGIE) CAPS 1 capsule by Does not apply route 2 (two) times daily.   omeprazole (PRILOSEC) 40 MG capsule Take 1 capsule (40 mg total) by mouth 2 (two) times daily before a meal. Take 30- 60 min before your first and last meals of the day   Potassium 99 MG TABS Take 1 tablet by mouth 2 (two) times daily.   valACYclovir (VALTREX) 500 MG tablet TAKE 1 TABLET BY MOUTH  DAILY (Patient taking differently: Take 500 mg by mouth daily.)   aspirin EC 81 MG tablet Take 81 mg by mouth every 6 (six) hours as needed for moderate pain. Swallow whole. (Patient not taking: Reported on 04/03/2021)   Biotin 5000 MCG TABS Take 5,000 mcg by mouth daily. (Patient not taking: Reported on 04/03/2021)   Biotin w/ Vitamins C & E (HAIR/SKIN/NAILS PO) Take 1 tablet by mouth daily. (Patient not taking: Reported on 04/03/2021)   HYDROcodone-acetaminophen (HYCET) 7.5-325 mg/15 ml solution Take 15 mLs by mouth 4 (four) times daily as needed for moderate pain. (Patient not taking: Reported on 04/03/2021)   ipratropium-albuterol (DUONEB) 0.5-2.5 (3) MG/3ML SOLN Take 3 mLs by nebulization every 6 (six) hours as needed. (Patient not taking: Reported on  04/03/2021)   Respiratory Therapy Supplies (NEBULIZER/TUBING/MOUTHPIECE) KIT 1 application by Does not apply route every 6 (six) hours as needed. FAX TO [phone] (Patient not taking: Reported on 04/03/2021)   sucralfate (CARAFATE) 1 GM/10ML suspension Take 10 mLs (1 g total) by mouth 4 (four) times daily.   [DISCONTINUED] ipratropium (ATROVENT HFA) 17 MCG/ACT inhaler Inhale 2 puffs into the lungs 4 (four) times daily.   No facility-administered encounter medications on file as of 04/03/2021.    OT Update: pending home assessment and modifications  Session Summary: Patient lives alone  ( with exception of 4 cats) reports no family. C/o back pain and headaches. Take multiple supplements.  Reports she has heating and electrical problems.   Goals Addressed               This Visit's Progress     RN Aging Gracefully Goal: (pt-stated)        Patient will report decrease in headaches in the next 120 days.   04/03/2021 Assessment: Patient reports daily  headaches. Reports losing 50 pounds in the last 4 months intentionally. Reports she takes goody powder's daily.   Interventions: Encouraged patient to self monitor her blood pressure 3 times per week and record readings. Encouraged patient to continue to take her medications as prescribed. Encouraged patient to review her supplements to see if this could be the cause of her headaches. Encouraged patient to talk to her Dr about her headaches. Provided AG tool with blood pressure log. Provided my contact information via business card.   Plan: follow up planned for 05/13/2021 at 11:30 am Tomasa Rand RN, BSN, CEN RN Case Freight forwarder for [place] Mobile: [phone]         Tomasa Rand RN, BSN, Careers information officer for [employer] Mobile: [phone]

## 2021-04-03 NOTE — Patient Instructions (Signed)
Visit Information   Thank you for taking time to visit with me today. Please don't hesitate to contact me if I can be of assistance to you before our next scheduled telephone appointment.  Following are the goals we discussed today:  (Copy and paste patient goals from clinical care plan here)  Our next appointment is on 05/13/2021 at 45    If you are experiencing a Mental Health or [place] or need someone to talk to, please call the Suicide and Crisis Lifeline: 988 call the [place] National Suicide Prevention Lifeline: 401-254-2972 or TTY: 609-180-3756 TTY 504-219-9696) to talk to a trained counselor call 1-800-273-TALK (toll free, 24 hour hotline) call 911    Goals Addressed               This Visit's Progress     RN Aging Gracefully Goal: (pt-stated)        Patient will report decrease in headaches in the next 120 days.   04/03/2021 Assessment: Patient reports daily headaches. Reports losing 50 pounds in the last 4 months intentionally. Reports she takes goody powder's daily.   Interventions: Encouraged patient to self monitor her blood pressure 3 times per week and record readings. Encouraged patient to continue to take her medications as prescribed. Encouraged patient to review her supplements to see if this could be the cause of her headaches. Encouraged patient to talk to her Dr about her headaches. Provided AG tool with blood pressure log. Provided my contact information via business card.   Plan: follow up planned for 05/13/2021 at 11:30 am Tomasa Rand RN, BSN, CEN RN Case Freight forwarder for [place] Mobile: [phone]          Tomasa Rand RN, BSN, Careers information officer for [employer] Mobile: [phone]

## 2021-05-13 ENCOUNTER — Other Ambulatory Visit: Payer: Self-pay

## 2021-05-13 NOTE — Patient Outreach (Signed)
Aging Gracefully Program  RN Visit  05/13/2021  Grace [name] [birth year] [id number]  Visit:   RN home visit #2  Start Time:   1130 End Time:   1200 Total Minutes:   30  Readiness To Change Score:     Universal RN Interventions: Calendar Distribution: Yes Exercise Review: Yes Medications: Yes Medication Changes: Yes Mood: Yes Pain: Yes PCP Advocacy/Support: No Fall Prevention: Yes Incontinence: Yes Clinician View Of Client Situation: Patient ambulatory to the door. House smells of cats.  Patient appears happy to get her home modifications started. Client View Of His/Her Situation: Patient reports that she is having back pain today. Reports she cleaned her house yesterday. Also states that she continues to have frequent headaches.  Reports she needs to get back on her keto diet. Reports she has not eaten well since the "holidays"  She reports concern for her trees.  Healthcare Provider Communication: Did Higher education careers adviser With [employer] Provider?: No According to Client, Did PCP Report Communication With An Aging Gracefully RN?: No  Clinician View of Client Situation: Clinician View Of Client Situation: Patient ambulatory to the door. House smells of cats.  Patient appears happy to get her home modifications started. Client's View of His/Her Situation: Client View Of His/Her Situation: Patient reports that she is having back pain today. Reports she cleaned her house yesterday. Also states that she continues to have frequent headaches.  Reports she needs to get back on her keto diet. Reports she has not eaten well since the "holidays"  She reports concern for her trees.  Medication Assessment: denies any changes to medications    OT Update: contracts signed with [employer]. Pending starting date.  Session Summary: Patient reports she is doing well. Reports she needs to call [facility] to inquire about silver sneakers and reports she needs to call about her HVAC.   Encouraged patient to do so. Reviewed home exercise plan.   Goals Addressed               This Visit's Progress     RN Aging Gracefully Goal: (pt-stated)        Patient will report decrease in headaches in the next 120 days.   04/03/2021 Assessment: Patient reports daily headaches. Reports losing 50 pounds in the last 4 months intentionally. Reports she takes goody powder's daily.   Interventions: Encouraged patient to self monitor her blood pressure 3 times per week and record readings. Encouraged patient to continue to take her medications as prescribed. Encouraged patient to review her supplements to see if this could be the cause of her headaches. Encouraged patient to talk to her Dr about her headaches. Provided AG tool with blood pressure log. Provided my contact information via business card.   Plan: follow up planned for 05/13/2021 at 11:30 am Tomasa Rand RN, BSN, CEN RN Case Manager for [place] Mobile: [phone]   05/13/2021 Assessments:  Reports that she continues to have back pain and headache. Pain 10/10 today.  Reports blood pressure is doing well.  Interventions:  encouraged patient to continue to take OTC medications and use heat and ice.  Provided home exercise plan and demonstrated use. Encouraged to do daily. Plan: follow up in 1 month.  Tomasa Rand RN, BSN, Careers information officer for [employer] Mobile: [phone]        Tomasa Rand RN, BSN, Careers information officer for [employer] Mobile: [phone]

## 2021-05-13 NOTE — Patient Instructions (Signed)
Goals Addressed               This Visit's Progress     RN Aging Gracefully Goal: (pt-stated)        Patient will report decrease in headaches in the next 120 days.   04/03/2021 Assessment: Patient reports daily headaches. Reports losing 50 pounds in the last 4 months intentionally. Reports she takes goody powder's daily.   Interventions: Encouraged patient to self monitor her blood pressure 3 times per week and record readings. Encouraged patient to continue to take her medications as prescribed. Encouraged patient to review her supplements to see if this could be the cause of her headaches. Encouraged patient to talk to her Dr about her headaches. Provided AG tool with blood pressure log. Provided my contact information via business card.   Plan: follow up planned for 05/13/2021 at 11:30 am Tomasa Rand RN, BSN, CEN RN Case Manager for [place] Mobile: [phone]   05/13/2021 Assessments:  Reports that she continues to have back pain and headache. Pain 10/10 today.  Reports blood pressure is doing well.  Interventions:  encouraged patient to continue to take OTC medications and use heat and ice.  Provided home exercise plan and demonstrated use. Encouraged to do daily. Plan: follow up in 1 month.  Tomasa Rand RN, BSN, Careers information officer for [employer] Mobile: [phone]

## 2021-06-17 ENCOUNTER — Other Ambulatory Visit: Payer: Self-pay

## 2021-06-17 NOTE — Patient Instructions (Signed)
Visit Information ? ? Goals Addressed   ? ?  ?  ?  ?  ?  ? This Visit's Progress  ?   RN Aging Gracefully Goal: (pt-stated)     ?   Patient will report decrease in headaches in the next 120 days.  ? ?04/03/2021 ?Assessment: Patient reports daily headaches. Reports losing 50 pounds in the last 4 months intentionally. Reports she takes goody powder's daily.  ? ?Interventions: Encouraged patient to self monitor her blood pressure 3 times per week and record readings. Encouraged patient to continue to take her medications as prescribed. Encouraged patient to review her supplements to see if this could be the cause of her headaches. Encouraged patient to talk to her Dr about her headaches. Provided AG tool with blood pressure log. Provided my contact information via business card.  ? ?Plan: follow up planned for 05/13/2021 at 11:30 am ?Tomasa Rand RN, BSN, CEN ?RN Case Manager for Aging Gracefully ?[place] ?Mobile: [phone]  ? ?05/13/2021 ?Assessments:  Reports that she continues to have back pain and headache. Pain 10/10 today.  Reports blood pressure is doing well.  ?Interventions:  encouraged patient to continue to take OTC medications and use heat and ice.  Provided home exercise plan and demonstrated use. Encouraged to do daily. ?Plan: follow up in 1 month. ? ?Tomasa Rand RN, BSN, CEN ?RN Case Manager for Aging Gracefully ?[place] ?Mobile: [phone]  ? ? ?06/17/2021 ?Assessment: reports decrease in headache and overall pain,   Is doing home exercise plan and has joined the gym. ? ?Interventions: encouraged patient to continue to be active, take medication as prescribed and follow up with MD. Encouraged patient if she is not happy with her doctor to call insurance company to assist with finding a new MD. Patient voiced understanding. ? ?Plan: Follow up home visi tplanned for May 3rd  at 1030 am. ? ?Tomasa Rand RN, BSN, CEN ?RN Case Manager for Aging Gracefully ?[place] ?Mobile: [phone]  ? ?  ? ?  ?  ?

## 2021-06-17 NOTE — Patient Outreach (Signed)
Aging Gracefully Program ? ?RN Visit ? ?06/17/2021 ? ?Massie Kluver Emme ?1955/06/07 ?[id number] ? ?Visit:   Rn home visit #3 ? ?Start Time:   1200 ?End Time:   5035 ?Total Minutes:    ? ?Readiness To Change Score:    ? ?Universal RN Interventions: ?Calendar Distribution: Yes ?Exercise Review: Yes ?Medications: Yes ?Medication Changes: Yes ?Mood: Yes ?Pain: Yes ?PCP Advocacy/Support: No ?Fall Prevention: Yes ?Incontinence: Yes ?Clinician View Of Client Situation: Patient answered the door without difficulty. walked in yeard without difficlut. Showed me her bathroom remodel. ?Client View Of His/Her Situation: Patient reports that she is doing well. Reports having a hard time with weight loss, brittle finger nails, Hair breaking. Reports she thought this was her thyroid but MD did not think so.  reports no recent falls. Has started to work out and has joined [employer] as a benefit of her insurance.   Reports no changes to medications, States that her pain is decreaed. reports her breathing is about the same,. States she knows it is pollen season. ? ?Healthcare Provider Communication: ?Did Higher education careers adviser With [employer] Provider?: No ?According to Client, Did PCP Report Communication With An Aging Gracefully RN?: No ? ?Clinician View of Client Situation: ?Clinician View Of Client Situation: Patient answered the door without difficulty. walked in yeard without difficlut. Showed me her bathroom remodel. ?Client's View of His/Her Situation: ?Client View Of His/Her Situation: Patient reports that she is doing well. Reports having a hard time with weight loss, brittle finger nails, Hair breaking. Reports she thought this was her thyroid but MD did not think so.  reports no recent falls. Has started to work out and has joined [employer] as a benefit of her insurance.   Reports no changes to medications, States that her pain is decreaed. reports her breathing is about the same,. States she knows it is pollen season. ? ?Medication  Assessment:no changes ?  ? ?OT Update: pending completion of home modifications ? ?Session Summary: Patient loves her new front door and her new bathroom.  Very happy with all the assistance she is receiving. Reports she is working out and beginning to feel stronger.   ? ? Goals Addressed   ? ?  ?  ?  ?  ?  ? This Visit's Progress  ?   RN Aging Gracefully Goal: (pt-stated)     ?   Patient will report decrease in headaches in the next 120 days.  ? ?04/03/2021 ?Assessment: Patient reports daily headaches. Reports losing 50 pounds in the last 4 months intentionally. Reports she takes goody powder's daily.  ? ?Interventions: Encouraged patient to self monitor her blood pressure 3 times per week and record readings. Encouraged patient to continue to take her medications as prescribed. Encouraged patient to review her supplements to see if this could be the cause of her headaches. Encouraged patient to talk to her Dr about her headaches. Provided AG tool with blood pressure log. Provided my contact information via business card.  ? ?Plan: follow up planned for 05/13/2021 at 11:30 am ?Tomasa Rand RN, BSN, CEN ?RN Case Manager for Aging Gracefully ?[place] ?Mobile: [phone]  ? ?05/13/2021 ?Assessments:  Reports that she continues to have back pain and headache. Pain 10/10 today.  Reports blood pressure is doing well.  ?Interventions:  encouraged patient to continue to take OTC medications and use heat and ice.  Provided home exercise plan and demonstrated use. Encouraged to do daily. ?Plan: follow up in 1 month. ? ?  Tomasa Rand RN, BSN, CEN ?RN Case Manager for Aging Gracefully ?[place] ?Mobile: [phone]  ? ? ?06/17/2021 ?Assessment: reports decrease in headache and overall pain,   Is doing home exercise plan and has joined the gym. ? ?Interventions: encouraged patient to continue to be active, take medication as prescribed and follow up with MD. Encouraged patient if she is not happy  with her doctor to call insurance company to assist with finding a new MD. Patient voiced understanding. ? ?Plan: Follow up home visi tplanned for May 3rd  at 1030 am. ? ?Tomasa Rand RN, BSN, CEN ?RN Case Manager for Aging Gracefully ?[place] ?Mobile: [phone]  ? ?  ? ?  ?  ? ? ?

## 2021-06-18 ENCOUNTER — Other Ambulatory Visit: Payer: Medicare Other | Admitting: Occupational Therapy

## 2021-06-18 ENCOUNTER — Other Ambulatory Visit: Payer: Self-pay

## 2021-06-18 NOTE — Patient Outreach (Signed)
Aging Gracefully Program ? ?OT Follow-Up Visit ? ?06/18/2021 ? ?Grace [name] ?16-Jun-1955 ?[id number] ? ?Visit:    ? ?Start Time:  1310 ?End Time:  9373 ?Total Minutes:  5 ? ? ?Readiness to Change Score :  Readiness to Change Score: 10 ? ? ?Patient Education: ?Education Provided: Yes ?Education Details: another copy of getting up from a fall and safety at home checklist--went over these with her. ?Person(s) Educated: Patient ?Comprehension: Verbalized Understanding ? ?Goals:  ? Goals Addressed   ? ?  ?  ?  ?  ?  ? This Visit's Progress  ?   Patient Stated (pt-stated)     ?   She would like to feel more safe in bathroom (walk in shower with grab bars, outlet in bathroom, heater fixed potentially, faucet fixed so does not leak so she can use sink in bathroom). Client is now interested in a shower seat with back.Marland Kitchen ?Goal partially complete--last piece will be shower seat with back. ?  ?   COMPLETED: Patient Stated     ?   She would like to feel more safe getting in and out of her house (pathway at back that is level, rails at back door, and rails at front door secured) ?Back pathway has been leveled, there is a new rail at steps for back door, and rails at front door are now secure. ?  ?   COMPLETED: Patient Stated     ?   She would like dryer to be more accessible (move dryer back into kitchen), would also like clothes line strung. ? ?Client decided not to have dryer moved into house. Per client [facility] was unable to restring her clothesline. ?  ?   COMPLETED: Patient Stated     ?   She would like to feel more secure in her home ( new front door with new locks so there are not any openings under the storm and main door;new locks at back door, perhaps new door at back or glass replaced in current door. ?Client now has a new front door with new locks, there are not any gaps under screen or front door, back door broken glass has been replaced with a wood panel. ?  ? ?  ? ? ?Post Clinical Reasoning: ?Client Action (Goal) One  Interventions: Has walk in shower, grab bars, and hand held shower head. In further talking to her a new more sturdy shower seat would be of benefit. ?Targeted Problem Area Status: A Little Better ?Client Action (Goal) Two Interventions: Secure railings at front door; new rail at back door, and level path at back door ?Did Client Try?: Yes ?Targeted Problem Area Status: A Lot Better ?Client Action (Goal) Three Interventions: Client decided she did not want dryer moved into house. Per client [facility] was unable to string her clothes line back up. ?Client Action (Goal) Four Interventions: Client has a new front door with new locks.No gaps at front or back door, back door more secure and new panel of wood put in. ?Targeted Problem Area Status: A Lot Better ?Clinician View Of Client Situation:: Ms. Badalamenti continues to stay active. She goes to the gym and recently took a Pilates class she really liked. ?Client View Of His/Her Situation:: Ms. Ahart is happy with the home modifications that have been completed and is looking forward to home repairs still yet to come. ?Next Visit Plan:: shower seat ? ?[place], OTR/L ?Aging Gracefully ?[phone] ? ? ?

## 2021-06-18 NOTE — Patient Instructions (Addendum)
Goal 1: She would like to feel more safe in bathroom (walk in shower with grab bars, outlet in bathroom, heater fixed potentially, faucet fixed so does not leak so she can use sink in bathroom). Client is now interested in a shower seat with back.Marland Kitchen ?Goal partially complete--last piece will be shower seat with back. ? ?Goal 2: She would like dryer to be more accessible (move dryer back into kitchen), would also like clothes line strung. ?Client decided not to have dryer moved into house. Per client [facility] was unable to restring her clothesline. ? ?Goal 3: She would like to feel more safe getting in and out of her house (pathway at back that is level, rails at back door, and rails at front door secured) ?Back pathway has been leveled, there is a new rail at steps for back door, and rails at front door are now secure. ? ?Goal 4: She would like to feel more secure in her home ( new front door with new locks so there are not any openings under the storm and main door;new locks at back door, perhaps new door at back or glass replaced in current door. ?Client now has a new front door with new locks, there are not any gaps under screen or front door, back door broken glass has been replaced with a wood panel. ? ? ?Grace [name] is happy with her modifications and has been using them without any issues since they have been completed. ?

## 2021-06-21 ENCOUNTER — Other Ambulatory Visit: Payer: Medicare Other

## 2021-07-08 ENCOUNTER — Other Ambulatory Visit: Payer: Self-pay | Admitting: *Deleted

## 2021-07-08 MED ORDER — TRELEGY ELLIPTA 100-62.5-25 MCG/ACT IN AEPB
1.0000 | INHALATION_SPRAY | Freq: Every day | RESPIRATORY_TRACT | 3 refills | Status: DC
Start: 2021-07-08 — End: 2023-02-26

## 2021-08-14 ENCOUNTER — Other Ambulatory Visit: Payer: Self-pay

## 2021-08-14 NOTE — Patient Outreach (Addendum)
Aging Gracefully Program ? ?RN Visit ? ?08/14/2021 ? ?Grace [name] ?[birth year] ?[id number] ? ?Visit:   4th RN Visit ? ?Start Time:   10:45 am ?End Time:   11:30 am ?Total Minutes:   45 min ? ?Readiness To Change Score:  Readiness to Change Score: 9 ? ?Universal RN Interventions: ?Exercise Review: Yes (reports exercising about every other day) ?Medication Changes: Yes (reports provider added Lisinopril and Simvastatin) ?Pain: Yes (reports chronic back pain aggravated by vacuuming yesterday) ?Fall Prevention: Yes (denies any falls, gait steady) ?Clinician View Of Client Situation: RNCM pleasantly greeted by patient at the door. Sat in front room, neetly kept. getting up and down onto sofa without difficulty gait steady at this time. 2 cats noted. exersice bike noted in room. patient appears very knowledgeable about her health and appears very resourceful and adapt at seeking provider assistance when needed ?Client View Of His/Her Situation: Patient reports chronic back pain that was agravated by vacuuming yesterday, but states she is resting her back and showed RN an inflatable tub that she has purchased. She reports her only concern is regarding COPD and how it may affect her in the future adding she does not have anyone to help her should she get to that point. ? ?Healthcare Provider Communication: ?Did Higher education careers adviser With [employer] Provider?: No ?According to Client, Did PCP Report Communication With An Aging Gracefully RN?: No ? ?Clinician View of Client Situation: ?Clinician View Of Client Situation: RNCM pleasantly greeted by patient at the door. Sat in front room, neetly kept. getting up and down onto sofa without difficulty gait steady at this time. 2 cats noted. exersice bike noted in room. patient appears very knowledgeable about her health and appears very resourceful and adapt at seeking provider assistance when needed ?Client's View of His/Her Situation: ?Ambulance person Of His/Her Situation: Patient  reports chronic back pain that was agravated by vacuuming yesterday, but states she is resting her back and showed RN an inflatable tub that she has purchased. She reports her only concern is regarding COPD and how it may affect her in the future adding she does not have anyone to help her should she get to that point. ? ?Medication Assessment: Patient reports lisinopril and Simvastatin added by PCP ?  ? ?OT Update: n/a ? ?Session Summary: Overall, patient seems to be doing well. She reports chronic back pain exacerbated by vacuuming yesterday. She states has taken OTC medication; using TENS unit; a heating pad and is resting her back. We discussed alternatives: icy/hot, acupuncture to which patient reports she has had acupuncture in the past and that PCP office has someone who does acupuncture. She states she will inquire about what her insurance will cover if needed. She reports headache only on occasion. She reports she is eating Keto like meal planning and has eliminated sugar out of her diet. She reports she has noticed if she stays on the Keto like diet, then she does not need to use her rescue inhaler. She states she remains active riding stationary bike, and doing Davonna Belling "You tube" video for senior fitness. She is contemplating changing providers, but states she likes the activities her present provider office provides. She expressed only concern is that in the future COPD may progress -she reports she has already begun estate planning. Reports happy with work that has been done on her house and showed RNCM her bathroom. ? ?Thea Silversmith, RN, MSN, BSN, CCM ?Aging Gracefully  ?Care Management Coordinator ?[phone]  ? ?

## 2021-08-14 NOTE — Patient Instructions (Signed)
Goals Addressed   ? ?  ?  ?  ?  ?  ? This Visit's Progress  ?   RN Aging Gracefully Goal: (pt-stated)     ?   Patient will report decrease in headaches in the next 120 days.  ? ?08/14/2021 ?Assessment: She reports that her headaches have improved and she only has occasional headaches at this time.  ? ?Interventions:  RNCM encouraged her to continue to take medications as prescribed, encouraged her to contact provider with health questions and concerns as needed. Provided positive feedback regarding self management of her health. Patient voiced understanding. ? ?Plan: 4th/Final Visit. RNCM advised patient to contact provider for any further questions or concerns. ? ?Thea Silversmith, RN, MSN, BSN, CCM ?Aging Gracefully: Care Management Coordinator ?[phone]  ?  ? ?  ? ? ?

## 2021-08-16 ENCOUNTER — Other Ambulatory Visit: Payer: Self-pay | Admitting: Occupational Therapy

## 2021-08-17 NOTE — Patient Outreach (Signed)
Aging Gracefully Program ? ?OT Follow-Up Visit ? ?08/17/2021 ? ?Massie Kluver Whitehair ?Oct 01, 1955 ?[id number] ? ?Visit:  3- Third Visit ? ?Start Time:  1400 ?End Time:  5329 ?Total Minutes:  50 ? ?Readiness to Change Score :  Readiness to Change Score: 10 ? ?Durable Medical Equipment: ?Durable Medical Equipment: Shower Chair With Back ?Durable Medical Equipment Distribution Date: 08/16/21 ? ? ?Goals:  ? Goals Addressed   ? ?  ?  ?  ?  ?  ? This Visit's Progress  ?   COMPLETED: Patient Stated (pt-stated)     ?   She would like to feel more safe in bathroom (walk in shower with grab bars, outlet in bathroom, heater fixed potentially, faucet fixed so does not leak so she can use sink in bathroom). Client is now interested in a shower seat with back.Marland Kitchen ?Goal partially complete--last piece will be shower seat with back.Shower seat with back provided today(08/16/2021)-Goal Met ? ?ACTION PLANNING - BATHING  ?Target Problem Area: ?Decreased safety with showering  ?Why Problem May Occur: ?No shower seat, decreased balance, back pain  ?Target Goal: ?Safety and independence with showering  ? ?STRATEGIES ?Saving Your Energy: ?DO: DON'T:  ?Use a shower seat Minimize standing while bathing, it uses more energy  ?Keep all items you'll need within easy reach Rush  ?Modifying your home environment and making it safe: ?DO: DON'T:  ?Use grab bars in the shower    ?Place a rubber mat the size you need in the shower Place loose rugs in the bathroom- they can trip you or your walker/cane can get caught on them  ?Make sure the bathroom is well lit    ?Use handheld shower head   ?Simplifying the way you set up tasks or daily routines: ?DO: DON'T:  ?Plan to shower before you're overly tired Rush through bathing/showering   ?Gather all items before getting started   ? ?Practice ?It is important to practice the strategies so we can determine if they will be effective in helping to reach your goal. ?Follow these specific recommendations: ?1.Use your new  shower seat. ?2.Use a non-skid mat  ? ?If a strategy does not work the first time, try it again and again (and maybe again). ?Golden Circle, OTR/L       08/16/2021 ? ?  ? ?  ? ? ?Post Clinical Reasoning: ?Client Action (Goal) Four Interventions: Client was provided with a shower seat with back. She did not feel she needed to try it in shower today, but would when she needed it. ?Did Client Try?: No ?Reason Client Did Not Try?: Other (see above) ?Clinician View Of Client Situation:: Ms. Petruzzi reports she has not be as active as of late due to her back. She has reports she has not done as well with her diet and losing weight as of lately but is getting started back on it again. She showed me she had purchased herself an inflatable tub to put in her new walk in shower. She reports she misses her tub on days that her back is really bothering her because she cannot get down in a tub a soak. She is to let me know how it worked on our next visit. ?Client View Of His/Her Situation:: Ms. Campise is managing to do what she needs to in order to take care of herself. She just has to go at a slower pace whern her back is hurting her. She is looking forward to trying out her new inflatable tub. ?Next  Visit Plan:: Safety at home ? ?Golden Circle, OTR/L ?Aging Gracefully ?[phone] ? ? ?

## 2021-08-17 NOTE — Patient Instructions (Signed)
Goals Addressed   ? ?  ?  ?  ?  ?  ? This Visit's Progress  ?   COMPLETED: Patient Stated (pt-stated)     ?   She would like to feel more safe in bathroom (walk in shower with grab bars, outlet in bathroom, heater fixed potentially, faucet fixed so does not leak so she can use sink in bathroom). Client is now interested in a shower seat with back.Marland Kitchen ?Goal partially complete--last piece will be shower seat with back.Shower seat with back provided today(08/16/2021)-Goal Met ? ?ACTION PLANNING - BATHING  ?Target Problem Area: ?Decreased safety with showering  ?Why Problem May Occur: ?No shower seat, decreased balance, back pain  ?Target Goal: ?Safety and independence with showering  ? ?STRATEGIES ?Saving Your Energy: ?DO: DON'T:  ?Use a shower seat Minimize standing while bathing, it uses more energy  ?Keep all items you'll need within easy reach Rush  ?Modifying your home environment and making it safe: ?DO: DON'T:  ?Use grab bars in the shower    ?Place a rubber mat the size you need in the shower Place loose rugs in the bathroom- they can trip you or your walker/cane can get caught on them  ?Make sure the bathroom is well lit    ?Use handheld shower head   ?Simplifying the way you set up tasks or daily routines: ?DO: DON'T:  ?Plan to shower before you're overly tired Rush through bathing/showering   ?Gather all items before getting started   ? ?Practice ?It is important to practice the strategies so we can determine if they will be effective in helping to reach your goal. ?Follow these specific recommendations: ?1.Use your new shower seat. ?2.Use a non-skid mat  ? ?If a strategy does not work the first time, try it again and again (and maybe again). ?Golden Circle, OTR/L       08/16/2021 ? ?  ? ?  ? ?

## 2021-09-23 ENCOUNTER — Ambulatory Visit (INDEPENDENT_AMBULATORY_CARE_PROVIDER_SITE_OTHER): Payer: Medicare Other

## 2021-09-23 ENCOUNTER — Ambulatory Visit (INDEPENDENT_AMBULATORY_CARE_PROVIDER_SITE_OTHER): Payer: Medicare Other | Admitting: Podiatry

## 2021-09-23 DIAGNOSIS — M21621 Bunionette of right foot: Secondary | ICD-10-CM | POA: Diagnosis not present

## 2021-09-23 DIAGNOSIS — M21622 Bunionette of left foot: Secondary | ICD-10-CM

## 2021-09-23 DIAGNOSIS — M2012 Hallux valgus (acquired), left foot: Secondary | ICD-10-CM

## 2021-09-23 DIAGNOSIS — M2011 Hallux valgus (acquired), right foot: Secondary | ICD-10-CM

## 2021-09-23 NOTE — Progress Notes (Signed)
Subjective: 66 y.o. female PMHx diabetes mellitus type 2, COPD, coronary artery calcification seen on CAT scan, PFO presents today as a new patient for evaluation of symptomatic bunions to the bilateral feet that has been present for several years now.  The pain has actually increased over the last 2 years.  She has tried different shoe gear modifications with no improvement.  Unfortunately she says that the only shoes she can wear are soft fabric ballet slippers.  Any other shoe irritates the bunions and makes it too painful to walk.  She presents today to discuss surgery and correction of the bunion deformity   Past Medical History:  Diagnosis Date   Abnormal vaginal bleeding    Anxiety    Asthma    Barrett's esophagus    with high grade dysplasia per endoscopy 05/2005 // followed by Dr. [name] ([facility])   Chronic back pain    COPD (chronic obstructive pulmonary disease) ([place])    Diabetes mellitus without complication ([place])    type 2   Duodenal ulcer disease    thought to be contributed at least partly by overuse of NSAIDs and salicylates (goody powdr)   Endometrial polyp 03/2003   s/p resection in 03/2003. 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   Migraine    Nevus    Personal history of  failed conscious sedation 08/11/2011   Sleep apnea    does not use cpap   Stroke ([place]) 10/2000   Tachycardia    Thyroid disease    Tobacco abuse Jan 2014   quit   Past Surgical History:  Procedure Laterality Date   ADENOIDECTOMY     APPENDECTOMY  2017   BALLOON DILATION N/A 06/14/2020   Procedure: BALLOON DILATION;  Surgeon: [place] Landmark Telford Nab., MD;  Location: [place];  Service: Gastroenterology;  Laterality: N/A;   BIOPSY  02/15/2020   Procedure: BIOPSY;  Surgeon: Arta Silence, MD;  Location: [facility];  Service: Endoscopy;;   BIOPSY  06/14/2020   Procedure: BIOPSY;  Surgeon: Irving Copas., MD;  Location: [place];  Service: Gastroenterology;;   BRONCHIAL BIOPSY  03/15/2020   Procedure: BRONCHIAL BIOPSIES;  Surgeon: Garner Nash, DO;  Location: [facility];  Service: Pulmonary;;   BRONCHIAL BRUSHINGS  03/15/2020   Procedure: BRONCHIAL BRUSHINGS;  Surgeon: Garner Nash, DO;  Location: [place];  Service: Pulmonary;;   BRONCHIAL NEEDLE ASPIRATION BIOPSY  03/15/2020   Procedure: BRONCHIAL NEEDLE ASPIRATION BIOPSIES;  Surgeon: Garner Nash, DO;  Location: [facility];  Service: Pulmonary;;   BRONCHIAL WASHINGS  03/15/2020   Procedure: BRONCHIAL WASHINGS;  Surgeon: Garner Nash, DO;  Location: [facility];  Service: Pulmonary;;   CHOLECYSTECTOMY  COLONOSCOPY  02/07/2010   COLONOSCOPY WITH PROPOFOL N/A 02/15/2020   Procedure: COLONOSCOPY WITH PROPOFOL;  Surgeon: Arta Silence, MD;  Location: [facility];  Service: Endoscopy;  Laterality: N/A;   DILATION AND CURETTAGE OF UTERUS     ENDOSCOPIC MUCOSAL RESECTION N/A 06/14/2020   Procedure: ENDOSCOPIC MUCOSAL RESECTION;  Surgeon: [place] Landmark Telford Nab., MD;  Location: [place];  Service: Gastroenterology;  Laterality: N/A;   ESOPHAGOGASTRODUODENOSCOPY (EGD) WITH PROPOFOL N/A 02/15/2020   Procedure: ESOPHAGOGASTRODUODENOSCOPY (EGD) WITH PROPOFOL;  Surgeon: Arta Silence, MD;  [facility];  Service: Endoscopy;  Laterality: N/A;   ESOPHAGOGASTRODUODENOSCOPY (EGD) WITH PROPOFOL N/A 06/14/2020   Procedure: ESOPHAGOGASTRODUODENOSCOPY (EGD) WITH PROPOFOL;  Surgeon: [place] Landmark Telford Nab., MD;  Location: [place];  Service: Gastroenterology;  Laterality: N/A;   FOREIGN BODY REMOVAL  06/14/2020   Procedure: FOREIGN BODY REMOVAL;  Surgeon: [place] Landmark, Telford Nab., MD;  Location: [facility];  Service: Gastroenterology;;   HYSTEROSCOPY     LAPAROSCOPIC APPENDECTOMY N/A 09/23/2015   Procedure: APPENDECTOMY LAPAROSCOPIC;  Surgeon: Georganna Skeans, MD;  Location: [facility] OR;  Service: General;  Laterality: N/A;   [place], 1997   x 2 - In 1997, noted to have extensive pelvic adhesions and BL tubal obstruction   POLYPECTOMY  02/15/2020   Procedure: POLYPECTOMY;  Surgeon: Arta Silence, MD;  Location: [facility];  Service: Endoscopy;;   TONSILLECTOMY     TRANSTHORACIC ECHOCARDIOGRAM  06/2014    EF 55-60%.  No RWMA. Gr 1 DD. Cannot R/o PFO (consider bubble study).   UPPER ESOPHAGEAL ENDOSCOPIC ULTRASOUND (EUS) N/A 06/14/2020   Procedure: UPPER ESOPHAGEAL ENDOSCOPIC ULTRASOUND (EUS);  Surgeon: Irving Copas., MD;  Location: [place];  Service: Gastroenterology;  Laterality: N/A;   UPPER GASTROINTESTINAL ENDOSCOPY  12/18/2009   VIDEO BRONCHOSCOPY WITH ENDOBRONCHIAL NAVIGATION N/A 03/15/2020   Procedure: VIDEO BRONCHOSCOPY WITH ENDOBRONCHIAL NAVIGATION;  Surgeon: Garner Nash, DO;  Location: [place];  Service: Pulmonary;  Laterality: N/A;   VIDEO BRONCHOSCOPY WITH ENDOBRONCHIAL ULTRASOUND  03/15/2020   Procedure: VIDEO BRONCHOSCOPY WITH ENDOBRONCHIAL ULTRASOUND;  Surgeon: Garner Nash, DO;  Location: [facility];  Service: Pulmonary;;   WISDOM TOOTH EXTRACTION     Allergies  Allergen Reactions   Amlodipine     Cramps    Codeine     REACTION: headaches   Gabapentin     headache   Prozac [Fluoxetine Hcl] Other (See Comments)    Suicidal ideations     Objective: Physical Exam General: The patient is alert and oriented x3 in no acute distress.  Dermatology: Skin is cool, dry and supple bilateral lower extremities. Negative for open lesions or macerations.  Vascular: Palpable pedal pulses bilaterally. No edema or erythema noted. Capillary refill within normal limits.  Neurological: Epicritic and protective threshold grossly intact bilaterally.   Musculoskeletal Exam: Clinical evidence of bunion and tailor's bunionette deformity noted to the bilateral feet. There is moderate pain on palpation range of motion of the first MPJ. Lateral deviation of the hallux noted consistent with hallux abductovalgus.  With weightbearing and loading of the feet there is a prominent fifth metatarsal head as well with associated tenderness to palpation range of motion  Radiographic Exam: Increased intermetatarsal angle greater than 15 with a hallux abductus angle greater than 30 noted on AP view.  There is also lateral deviation of the fifth metatarsal head on AP view consistent with tailor's bunionette deformity  Assessment: 1. HAV w/ bunion deformity bilateral 2.  Tailor's bunionette deformity bilateral   Plan of Care:  1. Patient was evaluated. X-Rays reviewed. 2.  Unfortunately the patient has tried multiple conservative shoe gear modifications which have not helped alleviate any of the patient's symptoms.  The bunion deformities are moderate to severe I do believe surgery is warranted at this time.  We did discuss the surgery in detail which would include bunionectomy and tailor's bunionectomy procedures.  Risk benefits advantages and disadvantages explained.  All patient questions answered.  No guarantees were expressed or implied. 3.  Medical clearance will need to be obtained from cardiology, pulmonology, and her general PCP prior to surgery.  Surgery will be performed in the hospital 4.  Today the patient opted for surgical management.   Authorization for surgery was initiated today.  Surgery will consist of bunionectomy with osteotomy left.  Tailor's bunionectomy with fifth metatarsal osteotomy left. 5.  Return to clinic 1 week postop   Edrick Kins, DPM [facility]  Dr. Edrick Kins, DPM    [address], [place] [address]                Office [phone]  Fax [phone]

## 2021-09-24 ENCOUNTER — Telehealth: Payer: Self-pay

## 2021-09-24 NOTE — Telephone Encounter (Signed)
Received surgery paperwork from Dr. Amalia Hailey. Left a message for Fathima to call and scheduled surgery.

## 2021-09-25 ENCOUNTER — Ambulatory Visit (INDEPENDENT_AMBULATORY_CARE_PROVIDER_SITE_OTHER)
Admission: RE | Admit: 2021-09-25 | Discharge: 2021-09-25 | Disposition: A | Discharge status: Home / Self Care | Payer: Medicare Other | Source: Ambulatory Visit | Attending: Interventional Cardiology | Admitting: Interventional Cardiology

## 2021-09-25 DIAGNOSIS — Z87891 Personal history of nicotine dependence: Secondary | ICD-10-CM | POA: Diagnosis not present

## 2021-09-30 ENCOUNTER — Other Ambulatory Visit: Payer: Self-pay

## 2021-09-30 DIAGNOSIS — Z122 Encounter for screening for malignant neoplasm of respiratory organs: Secondary | ICD-10-CM

## 2021-09-30 DIAGNOSIS — Z87891 Personal history of nicotine dependence: Secondary | ICD-10-CM

## 2021-10-07 ENCOUNTER — Encounter: Payer: Self-pay | Admitting: Occupational Therapy

## 2021-11-13 ENCOUNTER — Other Ambulatory Visit: Payer: Self-pay

## 2021-11-13 NOTE — Patient Outreach (Signed)
Aging Gracefully Program  11/13/2021  Grace [name] 06/29/1955 [id number]   [facility] Evaluation Interviewer attempted to call patient on today regarding Aging Gracefully referral. No answer from patient after multiple rings. CMA left confidential voicemail for patient to return call.  Will attempt to call back within 1 week.   [place] Management Assistant [phone]

## 2021-11-18 ENCOUNTER — Other Ambulatory Visit: Payer: Self-pay

## 2021-11-18 NOTE — Patient Outreach (Signed)
Aging Gracefully Program  11/18/2021  Grace [name] 1955-06-06 [id number]   [facility] Evaluation Interviewer made contact with patient. Aging Gracefully 5 months survey completed.    [place] Management Assistant [phone]

## 2021-12-02 ENCOUNTER — Other Ambulatory Visit: Payer: Medicare Other

## 2022-04-17 ENCOUNTER — Other Ambulatory Visit: Payer: Self-pay

## 2022-04-17 NOTE — Patient Outreach (Signed)
Aging Gracefully Program  04/17/2022  CUMI SANAGUSTIN 1956/01/03 [id number]   [facility] Evaluation Interviewer made contact with patient. Aging Gracefully 9 month survey completed.     [place] Management Assistant [phone]

## 2022-06-03 ENCOUNTER — Other Ambulatory Visit: Payer: Self-pay | Admitting: Nurse Practitioner

## 2022-06-03 DIAGNOSIS — Z780 Asymptomatic menopausal state: Secondary | ICD-10-CM

## 2022-06-03 DIAGNOSIS — E2839 Other primary ovarian failure: Secondary | ICD-10-CM

## 2022-06-06 ENCOUNTER — Other Ambulatory Visit: Payer: Self-pay | Admitting: Nurse Practitioner

## 2022-06-06 DIAGNOSIS — N6310 Unspecified lump in the right breast, unspecified quadrant: Secondary | ICD-10-CM

## 2022-06-11 ENCOUNTER — Ambulatory Visit
Admission: RE | Admit: 2022-06-11 | Discharge: 2022-06-11 | Disposition: A | Discharge status: Home / Self Care | Payer: 59 | Source: Ambulatory Visit | Attending: Nurse Practitioner | Admitting: Nurse Practitioner

## 2022-06-11 DIAGNOSIS — E2839 Other primary ovarian failure: Secondary | ICD-10-CM

## 2022-06-11 DIAGNOSIS — Z780 Asymptomatic menopausal state: Secondary | ICD-10-CM

## 2022-06-19 ENCOUNTER — Ambulatory Visit
Admission: RE | Admit: 2022-06-19 | Discharge: 2022-06-19 | Disposition: A | Discharge status: Home / Self Care | Payer: Medicare Other | Source: Ambulatory Visit | Attending: Nurse Practitioner | Admitting: Nurse Practitioner

## 2022-06-19 ENCOUNTER — Ambulatory Visit
Admission: RE | Admit: 2022-06-19 | Discharge: 2022-06-19 | Disposition: A | Discharge status: Home / Self Care | Payer: 59 | Source: Ambulatory Visit | Attending: Nurse Practitioner | Admitting: Nurse Practitioner

## 2022-06-19 DIAGNOSIS — N6310 Unspecified lump in the right breast, unspecified quadrant: Secondary | ICD-10-CM

## 2022-08-18 ENCOUNTER — Telehealth: Payer: Self-pay | Admitting: Cardiology

## 2022-08-18 NOTE — Telephone Encounter (Signed)
Spoke to patient she was calling she had a recent echo done at [facility].Echo report and office visit requested with [facility] phone # [phone].Advised to keep appointment with Bernadene Person NP 5/8.I will make Emily's CMA aware.

## 2022-08-18 NOTE — Telephone Encounter (Signed)
FYI--Patient scheduled an appointment for 5/08 with Bernadene Person, NP. She had an echo recently with [facility] and they advised her to follow up with cardiology based on the results. She states their provider mentioned stage 3 heart failure and part of her heart being enlarged. She mentions that the results are being sent to the office.

## 2022-08-19 NOTE — Telephone Encounter (Signed)
Noted. Routing message to Bernadene Person NP [facility]).

## 2022-08-20 ENCOUNTER — Ambulatory Visit: Payer: 59 | Attending: Nurse Practitioner | Admitting: Nurse Practitioner

## 2022-08-20 VITALS — BP 110/82 | HR 82 | Ht 63.0 in | Wt 193.2 lb

## 2022-08-20 DIAGNOSIS — I2510 Atherosclerotic heart disease of native coronary artery without angina pectoris: Secondary | ICD-10-CM | POA: Diagnosis not present

## 2022-08-20 DIAGNOSIS — J449 Chronic obstructive pulmonary disease, unspecified: Secondary | ICD-10-CM

## 2022-08-20 DIAGNOSIS — E119 Type 2 diabetes mellitus without complications: Secondary | ICD-10-CM

## 2022-08-20 DIAGNOSIS — R931 Abnormal findings on diagnostic imaging of heart and coronary circulation: Secondary | ICD-10-CM | POA: Diagnosis not present

## 2022-08-20 DIAGNOSIS — E785 Hyperlipidemia, unspecified: Secondary | ICD-10-CM | POA: Diagnosis not present

## 2022-08-20 DIAGNOSIS — R072 Precordial pain: Secondary | ICD-10-CM | POA: Diagnosis not present

## 2022-08-20 DIAGNOSIS — I10 Essential (primary) hypertension: Secondary | ICD-10-CM | POA: Diagnosis not present

## 2022-08-20 DIAGNOSIS — Z7984 Long term (current) use of oral hypoglycemic drugs: Secondary | ICD-10-CM

## 2022-08-20 DIAGNOSIS — G4733 Obstructive sleep apnea (adult) (pediatric): Secondary | ICD-10-CM

## 2022-08-20 DIAGNOSIS — Z8673 Personal history of transient ischemic attack (TIA), and cerebral infarction without residual deficits: Secondary | ICD-10-CM

## 2022-08-20 NOTE — Progress Notes (Signed)
Office Visit    Patient Name: Grace [name] Date of Encounter: 08/20/2022  Primary Care Provider:  Estevan Oaks, NP Primary Cardiologist:  Bryan Lemma, MD  Chief Complaint    67 year old female with a history of coronary artery calcification noted on CT scan, family history of CAD, hypertension, hyperlipidemia, hypothyroidism, type 2 diabetes, CVA, OSA, COPD and GERD who presents for follow-up related to recent echocardiogram, concern for heart failure.   Past Medical History    Past Medical History:  Diagnosis Date   Abnormal vaginal bleeding    Anxiety    Asthma    Barrett's esophagus    with high grade dysplasia per endoscopy 05/2005 // followed by Dr. Stan Head ([facility])   Chronic back pain    COPD (chronic obstructive pulmonary disease) (HCC)    Diabetes mellitus without complication (HCC)    type 2   Duodenal ulcer disease    thought to be contributed at least partly by overuse of NSAIDs and salicylates (goody powdr)   Endometrial polyp 03/2003   s/p resection in 03/2003. 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   Migraine    Nevus    Personal history of failed conscious sedation 08/11/2011   Sleep apnea    does not use cpap   Stroke (HCC) 10/2000   Tachycardia     Thyroid disease    Tobacco abuse Jan 2014   quit   Past Surgical History:  Procedure Laterality Date   ADENOIDECTOMY     APPENDECTOMY  2017   BALLOON DILATION N/A 06/14/2020   Procedure: BALLOON DILATION;  Surgeon: Meridee Score Netty Starring., MD;  Location: [facility] ENDOSCOPY;  Service: Gastroenterology;  Laterality: N/A;   BIOPSY  02/15/2020   Procedure: BIOPSY;  Surgeon: Willis Modena, MD;  Location: [facility] ENDOSCOPY;  Service: Endoscopy;;   BIOPSY  06/14/2020   Procedure: BIOPSY;  Surgeon: Lemar Lofty., MD;  Location: [facility] ENDOSCOPY;  Service: Gastroenterology;;   BRONCHIAL BIOPSY  03/15/2020   Procedure: BRONCHIAL BIOPSIES;  Surgeon: Josephine Igo, DO;  Location: [facility] ENDOSCOPY;  Service: Pulmonary;;   BRONCHIAL BRUSHINGS  03/15/2020   Procedure: BRONCHIAL BRUSHINGS;  Surgeon: Josephine Igo, DO;  Location: [facility] ENDOSCOPY;  Service: Pulmonary;;   BRONCHIAL NEEDLE ASPIRATION BIOPSY  03/15/2020   Procedure: BRONCHIAL NEEDLE ASPIRATION BIOPSIES;  Surgeon: Josephine Igo, DO;  Location: [facility] ENDOSCOPY;  Service: Pulmonary;;   BRONCHIAL WASHINGS  03/15/2020   Procedure: BRONCHIAL WASHINGS;  Surgeon: Josephine Igo, DO;  Location: [facility] ENDOSCOPY;  Service: Pulmonary;;   CHOLECYSTECTOMY     COLONOSCOPY  02/07/2010   COLONOSCOPY WITH PROPOFOL N/A 02/15/2020   Procedure: COLONOSCOPY WITH PROPOFOL;  Surgeon: Willis Modena, MD;  Location: [facility] ENDOSCOPY;  Service: Endoscopy;  Laterality: N/A;   DILATION AND CURETTAGE OF UTERUS     ENDOSCOPIC MUCOSAL RESECTION N/A 06/14/2020   Procedure: ENDOSCOPIC MUCOSAL RESECTION;  Surgeon: Meridee Score Netty Starring., MD;  Location: [facility] ENDOSCOPY;  Service: Gastroenterology;  Laterality: N/A;   ESOPHAGOGASTRODUODENOSCOPY (EGD) WITH PROPOFOL N/A 02/15/2020   Procedure: ESOPHAGOGASTRODUODENOSCOPY (EGD) WITH PROPOFOL;  Surgeon: Willis Modena, MD;  Location: [facility] ENDOSCOPY;  Service: Endoscopy;  Laterality: N/A;   ESOPHAGOGASTRODUODENOSCOPY (EGD) WITH PROPOFOL N/A 06/14/2020    Procedure: ESOPHAGOGASTRODUODENOSCOPY (EGD) WITH PROPOFOL;  Surgeon: Meridee Score Netty Starring., MD;  Location: [facility] ENDOSCOPY;  Service: Gastroenterology;  Laterality: N/A;   FOREIGN BODY REMOVAL  06/14/2020   Procedure: FOREIGN BODY REMOVAL;  Surgeon: Meridee Score, Netty Starring., MD;  Location: [facility] ENDOSCOPY;  Service: Gastroenterology;;   HYSTEROSCOPY     LAPAROSCOPIC APPENDECTOMY N/A 09/23/2015   Procedure: APPENDECTOMY LAPAROSCOPIC;  Surgeon: Violeta Gelinas, MD;  Location: [facility] OR;  Service: General;  Laterality: N/A;   MYOMECTOMY  1990, 1997   x 2 - In 1997, noted to have extensive pelvic adhesions and BL tubal obstruction   POLYPECTOMY  02/15/2020   Procedure: POLYPECTOMY;  Surgeon: Willis Modena, MD;  Location: [facility] ENDOSCOPY;  Service: Endoscopy;;   TONSILLECTOMY     TRANSTHORACIC ECHOCARDIOGRAM  06/2014    EF 55-60%.  No RWMA. Gr 1 DD. Cannot R/o PFO (consider bubble study).   UPPER ESOPHAGEAL ENDOSCOPIC ULTRASOUND (EUS) N/A 06/14/2020   Procedure: UPPER ESOPHAGEAL ENDOSCOPIC ULTRASOUND (EUS);  Surgeon: Lemar Lofty., MD;  Location: [facility] ENDOSCOPY;  Service: Gastroenterology;  Laterality: N/A;   UPPER GASTROINTESTINAL ENDOSCOPY  12/18/2009   VIDEO BRONCHOSCOPY WITH ENDOBRONCHIAL NAVIGATION N/A 03/15/2020   Procedure: VIDEO BRONCHOSCOPY WITH ENDOBRONCHIAL NAVIGATION;  Surgeon: Josephine Igo, DO;  Location: [facility] ENDOSCOPY;  Service: Pulmonary;  Laterality: N/A;   VIDEO BRONCHOSCOPY WITH ENDOBRONCHIAL ULTRASOUND  03/15/2020   Procedure: VIDEO BRONCHOSCOPY WITH ENDOBRONCHIAL ULTRASOUND;  Surgeon: Josephine Igo, DO;  Location: [facility] ENDOSCOPY;  Service: Pulmonary;;   WISDOM TOOTH EXTRACTION      Allergies  Allergies  Allergen Reactions   Amlodipine     Cramps    Codeine     REACTION: headaches   Gabapentin     headache   Prozac [Fluoxetine Hcl] Other (See Comments)    Suicidal ideations     Labs/Other Studies Reviewed    The following studies were reviewed today: None  Recent  Labs: No results found for requested labs within last 365 days.  Recent Lipid Panel    Component Value Date/Time   CHOL 118 09/28/2019 1211   CHOL 142 07/02/2015 0924   TRIG 90 09/28/2019 1211   HDL 45 (L) 09/28/2019 1211   HDL 58 07/02/2015 0924   CHOLHDL 2.6 09/28/2019 1211   VLDL 26 09/19/2016 1512   LDLCALC 56 09/28/2019 1211   LDLDIRECT 104 (H) 06/12/2010 1641    History of Present Illness    67 year old female with the above past medical history including coronary artery calcification noted on CT scan, family history of CAD, hypertension, hyperlipidemia, hypothyroidism, type 2 diabetes, CVA, OSA, COPD and GERD.  She has a history of COPD, former tobacco use, follows with pulmonology.  She has a family history of CAD. She was last seen in the office on 03/12/2020 and was stable from a cardiac standpoint.  She denied symptoms concerning for angina.  It was noted that should she have any symptoms, or require surgical clearance she would likely benefit from coronary CTA and  echocardiogram.  She contacted our office on 08/18/2022 and noted that she had an echocardiogram with her PCP who recommended follow-up with cardiology based on the results.  She presents today for follow-up.  Since her last visit she has been stable from a cardiac standpoint.  She does note intermittent chest discomfort both at rest and with activity.  She describes it as a sharp shooting pain.  She denies any dyspnea, edema, PND, orthopnea, weight gain.  Denies palpitations, dizziness.  Unfortunately, I do not have her recent echocardiogram results available to review despite multiple attempts to obtain these records today.    Home Medications    Current Outpatient Medications  Medication Sig Dispense Refill   albuterol (PROAIR HFA) 108 (90 Base) MCG/ACT inhaler INHALE 2 PUFFS EVERY 6  HOURS AS NEEDED FOR  SHORTNESS OF BREATH OR  WHEEZING (Patient taking differently: Inhale 2 puffs into the lungs every 6 (six) hours  as needed for wheezing.) 34 g 3   alum & mag hydroxide-simeth (MAALOX/MYLANTA) 200-200-20 MG/5ML suspension Take 30 mLs by mouth every 6 (six) hours as needed for indigestion or heartburn.     AMBULATORY NON FORMULARY MEDICATION Medication Name: GI cocktail  90 ML viscous lidocaine  90 ML 10 mg/5 ml Dicyclomine  270 ML Maalox  Swish and swallow 30 ML four times daily for 3 days 400 mL 0   aspirin EC 81 MG tablet Take 81 mg by mouth every 6 (six) hours as needed for moderate pain. Swallow whole. (Patient not taking: Reported on 04/03/2021)     Aspirin-Acetaminophen (GOODYS BODY PAIN PO) Take 1 Package by mouth daily as needed (pain/headache).     atorvastatin (LIPITOR) 80 MG tablet TAKE 1 TABLET BY MOUTH ONCE DAILY AT BEDTIME FOR  CHOLESTEROL (Patient taking differently: Take 80 mg by mouth at bedtime.) 90 tablet 3   Biotin 5000 MCG TABS Take 5,000 mcg by mouth daily.     Biotin w/ Vitamins C & E (HAIR/SKIN/NAILS PO) Take 1 tablet by mouth daily.     bisoprolol (ZEBETA) 10 MG tablet Take 10 mg by mouth daily.     calcium carbonate (TUMS - DOSED IN MG ELEMENTAL CALCIUM) 500 MG chewable tablet Chew 1 tablet by mouth daily as needed for indigestion or heartburn.     CINNAMON PO Take 1,200 mg by mouth daily.     COLLAGEN PO Take 1 Scoop by mouth daily.     COLLAGEN-VITAMIN C PO Take 3 tablets by mouth in the morning and at bedtime.     estradiol (ESTRACE VAGINAL) 0.1 MG/GM vaginal cream Place 1 Applicatorful vaginally at bedtime. (Patient taking differently: Place 1 Applicatorful vaginally 2 (two) times a week.) 42.5 g 12   Fluticasone-Umeclidin-Vilant (TRELEGY ELLIPTA) 100-62.5-25 MCG/ACT AEPB Inhale 1 puff into the lungs daily. 180 each 3   glipiZIDE (GLUCOTROL XL) 5 MG 24 hr tablet TAKE 1 TABLET BY MOUTH  DAILY WITH BREAKFAST (Patient taking differently: Take 5 mg by mouth every evening.) 90 tablet 1   glucose blood (ONETOUCH VERIO) test strip Use to check blood sugar twice daily. Dx: E11.9 300  each 1   guaiFENesin (MUCINEX) 600 MG 12 hr tablet Take 600 mg by mouth 2 (two) times daily as needed for to loosen phlegm or cough.     hydrALAZINE (APRESOLINE) 50 MG tablet TAKE 1 TABLET BY MOUTH 3  TIMES DAILY (Patient taking differently: Take 50 mg by mouth 3 (three) times daily.) 270 tablet 3   ibuprofen (ADVIL) 200 MG  tablet Take 800 mg by mouth every 8 (eight) hours as needed for moderate pain.     ipratropium-albuterol (DUONEB) 0.5-2.5 (3) MG/3ML SOLN Take 3 mLs by nebulization every 6 (six) hours as needed. 360 mL 6   Lancets (ONETOUCH DELICA PLUS LANCET33G) MISC USE TO CHECK BLOOD SUGAR  TWICE DAILY 200 each 3   levothyroxine (SYNTHROID) 137 MCG tablet TAKE 1 TABLET BY MOUTH  DAILY BEFORE BREAKFAST (Patient taking differently: Take 137 mcg by mouth daily before breakfast.) 90 tablet 2   losartan (COZAAR) 100 MG tablet TAKE 1 TABLET BY MOUTH  DAILY (Patient taking differently: Take 100 mg by mouth every evening.) 90 tablet 3   Magnesium 250 MG TABS Take 400 mg by mouth 2 (two) times daily.     Melatonin 10 MG TABS Take 10 mg by mouth at bedtime as needed (sleep).     Misc Natural Products (RESVERATROL ULTRA PO) Take 1,200 mg by mouth 2 (two) times daily.     Multiple Vitamins-Minerals (HAIR SKIN AND NAILS FORMULA) TABS Take 3 tablets by mouth daily.     Multiple Vitamins-Minerals (ONE-A-DAY WOMENS) tablet Take 1 tablet by mouth daily.     Non Gelatin Capsules, Empty, (CAPSULE 0 CLEAR VEGGIE) CAPS 1 capsule by Does not apply route 2 (two) times daily.     omeprazole (PRILOSEC) 40 MG capsule Take 1 capsule (40 mg total) by mouth 2 (two) times daily before a meal. Take 30- 60 min before your first and last meals of the day 60 capsule 11   Potassium 99 MG TABS Take 1 tablet by mouth 2 (two) times daily.     Respiratory Therapy Supplies (NEBULIZER/TUBING/MOUTHPIECE) KIT 1 application by Does not apply route every 6 (six) hours as needed. FAX TO [phone] 1 kit 11   sucralfate (CARAFATE) 1  GM/10ML suspension Take 10 mLs (1 g total) by mouth 4 (four) times daily. 1200 mL 2   valACYclovir (VALTREX) 500 MG tablet TAKE 1 TABLET BY MOUTH  DAILY (Patient taking differently: Take 500 mg by mouth daily.) 90 tablet 3   No current facility-administered medications for this visit.     Review of Systems    She denies palpitations, dyspnea, pnd, orthopnea, n, v, dizziness, syncope, edema, weight gain, or early satiety. All other systems reviewed and are otherwise negative except as noted above.   Physical Exam    VS:  BP 110/82   Pulse 82   Ht 5\' 3"  (1.6 m)   Wt 193 lb 3.2 oz (87.6 kg)   LMP 02/27/2014   SpO2 96%   BMI 34.22 kg/m   GEN: Well nourished, well developed, in no acute distress. HEENT: normal. Neck: Supple, no JVD, carotid bruits, or masses. Cardiac: RRR, no murmurs, rubs, or gallops. No clubbing, cyanosis, edema.  Radials/DP/PT 2+ and equal bilaterally.  Respiratory:  Respirations regular and unlabored, clear to auscultation bilaterally. GI: Soft, nontender, nondistended, BS + x 4. MS: no deformity or atrophy. Skin: warm and dry, no rash. Neuro:  Strength and sensation are intact. Psych: Normal affect.  Accessory Clinical Findings    ECG personally reviewed by me today -NSR, 82 bpm- no acute changes.   Lab Results  Component Value Date   WBC 7.7 05/29/2020   HGB 13.4 05/29/2020   HCT 40.7 05/29/2020   MCV 92.1 05/29/2020   PLT 223.0 05/29/2020   Lab Results  Component Value Date   CREATININE 0.80 05/29/2020   BUN 22 05/29/2020   NA 138 05/29/2020  K 4.0 05/29/2020   CL 102 05/29/2020   CO2 28 05/29/2020   Lab Results  Component Value Date   ALT 19 09/28/2019   AST 19 09/28/2019   ALKPHOS 80 02/15/2016   BILITOT 0.2 09/28/2019   Lab Results  Component Value Date   CHOL 118 09/28/2019   HDL 45 (L) 09/28/2019   LDLCALC 56 09/28/2019   LDLDIRECT 104 (H) 06/12/2010   TRIG 90 09/28/2019   CHOLHDL 2.6 09/28/2019    Lab Results  Component  Value Date   HGBA1C 6.0 (H) 09/28/2019    Assessment & Plan    1. Abnormal echocardiogram/Coronary artery calcification noted on CT/chest pain: Recent reportedly abnormal echocardiogram per PCP (this was done in the setting of chest discomfort).  We have contacted the office several times and do not yet have echo results to review. Euvolemic and well compensated on exam.  No signs of heart failure.  She does note intermittent chest discomfort both at rest and with activity which she describes as a sharp, shooting pain.  Given strong family history of CAD, recent chest discomfort, and through shared decision making, will pursue coronary CTA for further risk stratification. Will update BMET. Of note, she is no longer taking a statin.  She is intolerant to simvastatin and atorvastatin in the setting of myalgias/arthralgias.  She states is only taking Zetia (though I do not see this on her med list today).  If CT shows evidence of significant CAD, may need to consider referral to lipid clinic Pharm.D. for consideration of PCSK9 inhibitor/alternative lipid-lowering therapies.  Continue aspirin, bisoprolol, losartan, hydralazine, and Zetia.  2. Hypertension: BP well controlled. Continue current antihypertensive regimen.   3. Hyperlipidemia: No recent LDL on file.  Will request most recent labs from PCP.  She is no longer on statin therapy (see #1).  4. Type 2 diabetes: No recent A1c available for review.  Monitor managed per PCP.  Will request most recent labs as above.   5. History of CVA: No recurrence. Continue aspirin.  6. OSA: Non-adherent to CPAP.   7. COPD: Follows with pulmonology.  8. Disposition: Follow-up in  6-8 weeks.       Joylene Grapes, NP 08/20/2022, 3:51 PM

## 2022-08-20 NOTE — Patient Instructions (Signed)
Medication Instructions:  Metoprolol Tartrate 100 mg take 1 tab 2 hours prior to procedure.   *If you need a refill on your cardiac medications before your next appointment, please call your pharmacy*   Lab Work: BMET 1 week before scan  If you have labs (blood work) drawn today and your tests are completely normal, you will receive your results only by: MyChart Message (if you have MyChart) OR A paper copy in the mail If you have any lab test that is abnormal or we need to change your treatment, we will call you to review the results.   Testing/Procedures:   Your cardiac CT will be scheduled at one of the below locations:   [facility] [address] [place], [place] [address] [phone]  OR  [facility] [address] [place], [place] [address] [phone]  OR   [facility] [address] [place], [place] [address] [phone]  If scheduled at [facility], please arrive at the [facility]'s Entrance (Entrance C2) of [facility] 30 minutes prior to test start time. You can use the FREE valet parking offered at entrance C (encouraged to control the heart rate for the test)  Proceed to the [facility] Radiology Department (first floor) to check-in and test prep.  All radiology patients and guests should use entrance C2 at [facility], accessed from [facility], even though the hospital's physical address listed is [address]    If scheduled at [facility] or [facility], please arrive 15 mins early for check-in and test prep.   Please follow these instructions carefully (unless otherwise directed):  Hold all erectile dysfunction medications at least 3 days (72 hrs) prior to test. (Ie viagra, cialis, sildenafil, tadalafil, etc) We will administer nitroglycerin during this exam.    On the Night Before the Test: Be sure to Drink plenty of water. Do not consume any caffeinated/decaffeinated beverages or chocolate 12 hours prior to your test. Do not take any antihistamines 12 hours prior to your test. If the patient has contrast allergy: Patient will need a prescription for Prednisone and very clear instructions (as follows): Prednisone 50 mg - take 13 hours prior to test Take another Prednisone 50 mg 7 hours prior to test Take another Prednisone 50 mg 1 hour prior to test Take Benadryl 50 mg 1 hour prior to test Patient must complete all four doses of above prophylactic medications. Patient will need a ride after test due to Benadryl.  On the Day of the Test: Drink plenty of water until 1 hour prior to the test. Do not eat any food 1 hour prior to test. You may take your regular medications prior to the test.  Take metoprolol (Lopressor) two hours prior to test. If you take Furosemide/Hydrochlorothiazide/Spironolactone, please HOLD on the morning of the test. FEMALES- please wear underwire-free bra if available, avoid dresses & tight clothing   *For Clinical Staff only. Please instruct patient the following:* Heart Rate Medication Recommendations for Cardiac CT  Resting HR < 50 bpm  No medication  Resting HR 50-60 bpm and BP >110/50 mmHG   Consider Metoprolol tartrate 25 mg PO 90-120 min prior to scan  Resting HR 60-65 bpm and BP >110/50 mmHG  Metoprolol tartrate 50 mg PO 90-120 minutes prior to scan   Resting HR > 65 bpm and BP >110/50 mmHG  Metoprolol tartrate 100 mg  PO 90-120 minutes prior to scan  Consider Ivabradine 10-15 mg PO or a calcium channel blocker for resting HR >60 bpm and contraindication to metoprolol tartrate  Consider Ivabradine 10-15 mg PO in combination with metoprolol tartrate for HR >80 bpm         After the Test: Drink plenty of water. After receiving IV contrast, you may experience a mild flushed feeling. This is normal. On  occasion, you may experience a mild rash up to 24 hours after the test. This is not dangerous. If this occurs, you can take Benadryl 25 mg and increase your fluid intake. If you experience trouble breathing, this can be serious. If it is severe call 911 IMMEDIATELY. If it is mild, please call our office. If you take any of these medications: Glipizide/Metformin, Avandament, Glucavance, please do not take 48 hours after completing test unless otherwise instructed.  We will call to schedule your test 2-4 weeks out understanding that some insurance companies will need an authorization prior to the service being performed.   For non-scheduling related questions, please contact the cardiac imaging nurse navigator should you have any questions/concerns: Rockwell Alexandria, Cardiac Imaging Nurse Navigator Larey Brick, Cardiac Imaging Nurse Navigator [facility] Direct Office Dial: [phone]   For scheduling needs, including cancellations and rescheduling, please call [place], [phone].    Follow-Up: At [facility], you and your health needs are our priority.  As part of our continuing mission to provide you with exceptional heart care, we have created designated Provider Care Teams.  These Care Teams include your primary Cardiologist (physician) and Advanced Practice Providers (APPs -  Physician Assistants and Nurse Practitioners) who all work together to provide you with the care you need, when you need it.  We recommend signing up for the patient portal called "MyChart".  Sign up information is provided on this After Visit Summary.  MyChart is used to connect with patients for Virtual Visits (Telemedicine).  Patients are able to view lab/test results, encounter notes, upcoming appointments, etc.  Non-urgent messages can be sent to your provider as well.   To learn more about what you can do with MyChart, go to [email or web address].    Your next  appointment:   6-8 week(s)  Provider:   Bernadene Person, NP        Other Instructions

## 2022-08-21 ENCOUNTER — Telehealth: Payer: Self-pay | Admitting: Nurse Practitioner

## 2022-08-21 NOTE — Telephone Encounter (Signed)
Patient is calling to find out if [facility] has sent over her echo and lab report.  She states they were suppose to fax them over to [place] yesterday.

## 2022-08-21 NOTE — Telephone Encounter (Signed)
Called patient, advised that I would send a message over to CMA/RN to see if any records were received as it was called and they were faxing it yesterday during visit.   Advised we would let her know if we had no received anything.   Patient verbalized understanding.  Thankful for call back

## 2022-08-22 ENCOUNTER — Encounter: Payer: Self-pay | Admitting: Nurse Practitioner

## 2022-08-22 NOTE — Telephone Encounter (Signed)
Informed patient  received  echo report and office note not lab results  Patient states she will get lab work next week

## 2022-08-26 LAB — BASIC METABOLIC PANEL
BUN/Creatinine Ratio: 15 (ref 12–28)
BUN: 13 mg/dL (ref 8–27)
CO2: 22 mmol/L (ref 20–29)
Calcium: 8.7 mg/dL (ref 8.7–10.3)
Chloride: 105 mmol/L (ref 96–106)
Creatinine, Ser: 0.86 mg/dL (ref 0.57–1.00)
Glucose: 88 mg/dL (ref 70–99)
Potassium: 4.1 mmol/L (ref 3.5–5.2)
Sodium: 140 mmol/L (ref 134–144)
eGFR: 74 mL/min/{1.73_m2} (ref >59)

## 2022-08-27 ENCOUNTER — Other Ambulatory Visit: Payer: Self-pay

## 2022-08-27 MED ORDER — METOPROLOL TARTRATE 100 MG PO TABS: ORAL_TABLET | ORAL | 0 refills | Status: DC

## 2022-08-28 ENCOUNTER — Telehealth (HOSPITAL_COMMUNITY): Payer: Self-pay | Admitting: *Deleted

## 2022-08-28 NOTE — Telephone Encounter (Signed)
Reaching out to patient to offer assistance regarding upcoming cardiac imaging study; pt verbalizes understanding of appt date/time, parking situation and where to check in, pre-test NPO status and medications ordered, and verified current allergies; name and call back number provided for further questions should they arise  Giovannie Scerbo RN Navigator Cardiac Imaging [facility] [phone] office [phone] cell  Patient to take 100mg metoprolol tartrate two hours prior to her cardiac CT scan. She is aware to arrive at 11:30 am. 

## 2022-08-29 ENCOUNTER — Other Ambulatory Visit: Payer: Self-pay | Admitting: Cardiology

## 2022-08-29 ENCOUNTER — Ambulatory Visit (HOSPITAL_COMMUNITY)
Admission: RE | Admit: 2022-08-29 | Discharge: 2022-08-29 | Disposition: A | Discharge status: Home / Self Care | Payer: 59 | Source: Ambulatory Visit | Attending: Nurse Practitioner | Admitting: Nurse Practitioner

## 2022-08-29 ENCOUNTER — Ambulatory Visit (HOSPITAL_BASED_OUTPATIENT_CLINIC_OR_DEPARTMENT_OTHER)
Admission: RE | Admit: 2022-08-29 | Discharge: 2022-08-29 | Disposition: A | Discharge status: Home / Self Care | Payer: 59 | Source: Ambulatory Visit | Attending: Cardiology | Admitting: Cardiology

## 2022-08-29 DIAGNOSIS — R931 Abnormal findings on diagnostic imaging of heart and coronary circulation: Secondary | ICD-10-CM | POA: Diagnosis present

## 2022-08-29 DIAGNOSIS — I2510 Atherosclerotic heart disease of native coronary artery without angina pectoris: Secondary | ICD-10-CM

## 2022-08-29 DIAGNOSIS — R072 Precordial pain: Secondary | ICD-10-CM

## 2022-08-29 LAB — LAB REPORT - SCANNED: EGFR: 84

## 2022-08-29 MED ORDER — SODIUM CHLORIDE 0.9 % IV BOLUS
500.0000 mL | Freq: Once | INTRAVENOUS | Status: AC
  Administered 2022-08-29: 500 mL via INTRAVENOUS

## 2022-08-29 MED ORDER — DILTIAZEM HCL 25 MG/5ML IV SOLN
10.0000 mg | Freq: Once | INTRAVENOUS | Status: AC
  Administered 2022-08-29 (×2): 10 mg via INTRAVENOUS

## 2022-08-29 MED ORDER — METOPROLOL TARTRATE 5 MG/5ML IV SOLN
INTRAVENOUS | Status: AC
  Filled 2022-08-29: qty 5

## 2022-08-29 MED ORDER — IOHEXOL 350 MG/ML SOLN
100.0000 mL | Freq: Once | INTRAVENOUS | Status: AC | PRN
  Administered 2022-08-29: 100 mL via INTRAVENOUS

## 2022-08-29 MED ORDER — NITROGLYCERIN 0.4 MG SL SUBL
0.8000 mg | SUBLINGUAL_TABLET | SUBLINGUAL | Status: DC | PRN
  Administered 2022-08-29: 0.8 mg via SUBLINGUAL

## 2022-08-29 MED ORDER — DILTIAZEM HCL 25 MG/5ML IV SOLN
INTRAVENOUS | Status: AC
  Filled 2022-08-29: qty 5

## 2022-08-29 MED ORDER — NITROGLYCERIN 0.4 MG SL SUBL
SUBLINGUAL_TABLET | SUBLINGUAL | Status: AC
  Filled 2022-08-29: qty 2

## 2022-08-29 MED ORDER — METOPROLOL TARTRATE 5 MG/5ML IV SOLN
10.0000 mg | INTRAVENOUS | Status: DC | PRN
  Administered 2022-08-29: 5 mg via INTRAVENOUS
  Administered 2022-08-29: 10 mg via INTRAVENOUS

## 2022-08-29 NOTE — Progress Notes (Signed)
Patient tolerated without distress 

## 2022-08-29 NOTE — Progress Notes (Signed)
Called DrSkains, hr high 70s, ordered another dose 5mg  Metoprolol and ns bolus

## 2022-09-02 DIAGNOSIS — J449 Chronic obstructive pulmonary disease, unspecified: Secondary | ICD-10-CM

## 2022-09-02 DIAGNOSIS — I10 Essential (primary) hypertension: Secondary | ICD-10-CM

## 2022-09-02 DIAGNOSIS — R931 Abnormal findings on diagnostic imaging of heart and coronary circulation: Secondary | ICD-10-CM

## 2022-09-02 MED ORDER — FUROSEMIDE 20 MG PO TABS: 20.0000 mg | ORAL_TABLET | Freq: Every day | ORAL | 3 refills | Status: DC

## 2022-09-02 NOTE — Telephone Encounter (Signed)
Patient edema since scan on Friday.  Did not notice any edema until after the scan when she and tech noticed it.  Edema to feet and ankles. She states it has improved some since Friday but the pain is unbearable.  She does not "even want to get up to make a peanut butter sandwich".  She states pitting +1 by description. Elevating legs.  Watching sodium intake.  She has been taking OTC diurex (regular) 2 pills twice a day.  She states she needs more because this box was older.  Advised to wait on taking any until after speaking with provider.  She has no increased SOB since last visit. No other symptoms just edema and pain.  She completed the CT Friday.   Please advise

## 2022-09-16 ENCOUNTER — Telehealth: Payer: Self-pay

## 2022-09-16 NOTE — Telephone Encounter (Signed)
Patient return CMA's call regarding results. 

## 2022-09-16 NOTE — Telephone Encounter (Signed)
Lmom to discuss CT results and recommendations. Waiting on a return call.

## 2022-09-17 ENCOUNTER — Other Ambulatory Visit: Payer: Self-pay

## 2022-09-17 DIAGNOSIS — I10 Essential (primary) hypertension: Secondary | ICD-10-CM

## 2022-09-17 DIAGNOSIS — J449 Chronic obstructive pulmonary disease, unspecified: Secondary | ICD-10-CM

## 2022-09-17 DIAGNOSIS — R931 Abnormal findings on diagnostic imaging of heart and coronary circulation: Secondary | ICD-10-CM

## 2022-09-17 LAB — BASIC METABOLIC PANEL
BUN/Creatinine Ratio: 14 (ref 12–28)
BUN: 12 mg/dL (ref 8–27)
CO2: 24 mmol/L (ref 20–29)
Calcium: 9.4 mg/dL (ref 8.7–10.3)
Chloride: 105 mmol/L (ref 96–106)
Creatinine, Ser: 0.87 mg/dL (ref 0.57–1.00)
Glucose: 129 mg/dL — ABNORMAL HIGH (ref 70–99)
Potassium: 4.6 mmol/L (ref 3.5–5.2)
Sodium: 142 mmol/L (ref 134–144)
eGFR: 73 mL/min/{1.73_m2} (ref >59)

## 2022-09-18 ENCOUNTER — Telehealth: Payer: Self-pay

## 2022-09-18 DIAGNOSIS — Z789 Other specified health status: Secondary | ICD-10-CM

## 2022-09-18 MED ORDER — ISOSORBIDE MONONITRATE ER 30 MG PO TB24: 30.0000 mg | ORAL_TABLET | Freq: Every day | ORAL | 1 refills | Status: DC

## 2022-09-18 NOTE — Telephone Encounter (Signed)
Spoke with pt. Pt was notified of CT results. Pt is ok with starting Imdur ok per Dr. Herbie Baltimore and Bernadene Person NP. Sending a 30 day supply to [facility] pharmacy and if pt tolerates well, RX can change to a 90 day supply. Referral was placed for pharm-D.

## 2022-09-23 ENCOUNTER — Telehealth: Payer: Self-pay

## 2022-09-23 NOTE — Telephone Encounter (Signed)
Call has been addressed. See documentation.

## 2022-09-23 NOTE — Telephone Encounter (Signed)
Spoke with pt. Pt was notified of lab results. Pt will continue her current medication and f/u as planned.  

## 2022-09-29 ENCOUNTER — Ambulatory Visit
Admission: RE | Admit: 2022-09-29 | Discharge: 2022-09-29 | Disposition: A | Discharge status: Home / Self Care | Payer: 59 | Source: Ambulatory Visit | Attending: Acute Care | Admitting: Acute Care

## 2022-09-29 DIAGNOSIS — Z87891 Personal history of nicotine dependence: Secondary | ICD-10-CM

## 2022-09-29 DIAGNOSIS — Z122 Encounter for screening for malignant neoplasm of respiratory organs: Secondary | ICD-10-CM

## 2022-10-03 ENCOUNTER — Other Ambulatory Visit: Payer: Self-pay | Admitting: Acute Care

## 2022-10-03 DIAGNOSIS — Z87891 Personal history of nicotine dependence: Secondary | ICD-10-CM

## 2022-10-03 DIAGNOSIS — Z122 Encounter for screening for malignant neoplasm of respiratory organs: Secondary | ICD-10-CM

## 2022-10-15 ENCOUNTER — Ambulatory Visit: Payer: 59 | Attending: Nurse Practitioner | Admitting: Nurse Practitioner

## 2022-10-15 ENCOUNTER — Ambulatory Visit (INDEPENDENT_AMBULATORY_CARE_PROVIDER_SITE_OTHER): Payer: 59 | Admitting: Pharmacist

## 2022-10-15 ENCOUNTER — Telehealth: Payer: Self-pay | Admitting: Pharmacist

## 2022-10-15 ENCOUNTER — Encounter: Payer: Self-pay | Admitting: Pharmacist

## 2022-10-15 ENCOUNTER — Encounter: Payer: Self-pay | Admitting: Nurse Practitioner

## 2022-10-15 ENCOUNTER — Other Ambulatory Visit: Payer: Self-pay | Admitting: Gastroenterology

## 2022-10-15 VITALS — BP 142/68 | HR 76 | Ht 63.0 in | Wt 194.8 lb

## 2022-10-15 DIAGNOSIS — I5189 Other ill-defined heart diseases: Secondary | ICD-10-CM | POA: Diagnosis not present

## 2022-10-15 DIAGNOSIS — I2510 Atherosclerotic heart disease of native coronary artery without angina pectoris: Secondary | ICD-10-CM

## 2022-10-15 DIAGNOSIS — I517 Cardiomegaly: Secondary | ICD-10-CM

## 2022-10-15 DIAGNOSIS — J449 Chronic obstructive pulmonary disease, unspecified: Secondary | ICD-10-CM

## 2022-10-15 DIAGNOSIS — E119 Type 2 diabetes mellitus without complications: Secondary | ICD-10-CM

## 2022-10-15 DIAGNOSIS — Z789 Other specified health status: Secondary | ICD-10-CM

## 2022-10-15 DIAGNOSIS — G720 Drug-induced myopathy: Secondary | ICD-10-CM

## 2022-10-15 DIAGNOSIS — G4733 Obstructive sleep apnea (adult) (pediatric): Secondary | ICD-10-CM

## 2022-10-15 DIAGNOSIS — I10 Essential (primary) hypertension: Secondary | ICD-10-CM

## 2022-10-15 DIAGNOSIS — E785 Hyperlipidemia, unspecified: Secondary | ICD-10-CM

## 2022-10-15 DIAGNOSIS — Z7984 Long term (current) use of oral hypoglycemic drugs: Secondary | ICD-10-CM

## 2022-10-15 DIAGNOSIS — Z8673 Personal history of transient ischemic attack (TIA), and cerebral infarction without residual deficits: Secondary | ICD-10-CM

## 2022-10-15 DIAGNOSIS — T466X5A Adverse effect of antihyperlipidemic and antiarteriosclerotic drugs, initial encounter: Secondary | ICD-10-CM | POA: Insufficient documentation

## 2022-10-15 MED ORDER — FUROSEMIDE 40 MG PO TABS: ORAL_TABLET | ORAL | 3 refills | Status: DC

## 2022-10-15 NOTE — Progress Notes (Signed)
Office Visit    Patient Name: Grace [name] Date of Encounter: 10/15/2022  Primary Care Provider:  Estevan Oaks, NP Primary Cardiologist:  Bryan Lemma, MD  Chief Complaint    67 year old female with a history of CAD, hypertension, hyperlipidemia, hypothyroidism, type 2 diabetes, CVA, OSA, COPD and GERD who presents for follow-up related to CAD.   Past Medical History    Past Medical History:  Diagnosis Date   Abnormal vaginal bleeding    Anxiety    Asthma    Barrett's esophagus    with high grade dysplasia per endoscopy 05/2005 // followed by Dr. Stan Head ([facility])   Chronic back pain    COPD (chronic obstructive pulmonary disease) (HCC)    Diabetes mellitus without complication (HCC)    type 2   Duodenal ulcer disease    thought to be contributed at least partly by overuse of NSAIDs and salicylates (goody powdr)   Endometrial polyp 03/2003   s/p resection in 03/2003. 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   Migraine    Nevus    Personal history of failed conscious sedation 08/11/2011   Sleep apnea    does not use cpap   Stroke (HCC) 10/2000   Tachycardia    Thyroid disease    Tobacco abuse Jan 2014   quit   Past Surgical History:  Procedure Laterality Date    ADENOIDECTOMY     APPENDECTOMY  2017   BALLOON DILATION N/A 06/14/2020   Procedure: BALLOON DILATION;  Surgeon: Meridee Score Netty Starring., MD;  Location: [facility] ENDOSCOPY;  Service: Gastroenterology;  Laterality: N/A;   BIOPSY  02/15/2020   Procedure: BIOPSY;  Surgeon: Willis Modena, MD;  Location: [facility] ENDOSCOPY;  Service: Endoscopy;;   BIOPSY  06/14/2020   Procedure: BIOPSY;  Surgeon: Lemar Lofty., MD;  Location: [facility] ENDOSCOPY;  Service: Gastroenterology;;   BRONCHIAL BIOPSY  03/15/2020   Procedure: BRONCHIAL BIOPSIES;  Surgeon: Josephine Igo, DO;  Location: [facility] ENDOSCOPY;  Service: Pulmonary;;   BRONCHIAL BRUSHINGS  03/15/2020   Procedure: BRONCHIAL BRUSHINGS;  Surgeon: Josephine Igo, DO;  Location: [facility] ENDOSCOPY;  Service: Pulmonary;;   BRONCHIAL NEEDLE ASPIRATION BIOPSY  03/15/2020   Procedure: BRONCHIAL NEEDLE ASPIRATION BIOPSIES;  Surgeon: Josephine Igo, DO;  Location: [facility] ENDOSCOPY;  Service: Pulmonary;;   BRONCHIAL WASHINGS  03/15/2020   Procedure: BRONCHIAL WASHINGS;  Surgeon: Josephine Igo, DO;  Location: [facility] ENDOSCOPY;  Service: Pulmonary;;   CHOLECYSTECTOMY     COLONOSCOPY  02/07/2010   COLONOSCOPY WITH PROPOFOL N/A 02/15/2020   Procedure: COLONOSCOPY WITH PROPOFOL;  Surgeon: Willis Modena, MD;  Location: [facility] ENDOSCOPY;  Service: Endoscopy;  Laterality: N/A;   DILATION AND CURETTAGE  OF UTERUS     ENDOSCOPIC MUCOSAL RESECTION N/A 06/14/2020   Procedure: ENDOSCOPIC MUCOSAL RESECTION;  Surgeon: Meridee Score Netty Starring., MD;  Location: [facility] ENDOSCOPY;  Service: Gastroenterology;  Laterality: N/A;   ESOPHAGOGASTRODUODENOSCOPY (EGD) WITH PROPOFOL N/A 02/15/2020   Procedure: ESOPHAGOGASTRODUODENOSCOPY (EGD) WITH PROPOFOL;  Surgeon: Willis Modena, MD;  Location: [facility] ENDOSCOPY;  Service: Endoscopy;  Laterality: N/A;   ESOPHAGOGASTRODUODENOSCOPY (EGD) WITH PROPOFOL N/A 06/14/2020   Procedure: ESOPHAGOGASTRODUODENOSCOPY (EGD) WITH PROPOFOL;  Surgeon: Meridee Score Netty Starring., MD;  Location: [facility]  ENDOSCOPY;  Service: Gastroenterology;  Laterality: N/A;   FOREIGN BODY REMOVAL  06/14/2020   Procedure: FOREIGN BODY REMOVAL;  Surgeon: Meridee Score, Netty Starring., MD;  Location: [facility] ENDOSCOPY;  Service: Gastroenterology;;   HYSTEROSCOPY     LAPAROSCOPIC APPENDECTOMY N/A 09/23/2015   Procedure: APPENDECTOMY LAPAROSCOPIC;  Surgeon: Violeta Gelinas, MD;  Location[facility] OR;  Service: General;  Laterality: N/A;   MYOMECTOMY  1990, 1997   x 2 - In 1997, noted to have extensive pelvic adhesions and BL tubal obstruction   POLYPECTOMY  02/15/2020   Procedure: POLYPECTOMY;  Surgeon: Willis Modena, MD;  Location: [facility] ENDOSCOPY;  Service: Endoscopy;;   TONSILLECTOMY     TRANSTHORACIC ECHOCARDIOGRAM  06/2014    EF 55-60%.  No RWMA. Gr 1 DD. Cannot R/o PFO (consider bubble study).   UPPER ESOPHAGEAL ENDOSCOPIC ULTRASOUND (EUS) N/A 06/14/2020   Procedure: UPPER ESOPHAGEAL ENDOSCOPIC ULTRASOUND (EUS);  Surgeon: Lemar Lofty., MD;  Location: [facility] ENDOSCOPY;  Service: Gastroenterology;  Laterality: N/A;   UPPER GASTROINTESTINAL ENDOSCOPY  12/18/2009   VIDEO BRONCHOSCOPY WITH ENDOBRONCHIAL NAVIGATION N/A 03/15/2020   Procedure: VIDEO BRONCHOSCOPY WITH ENDOBRONCHIAL NAVIGATION;  Surgeon: Josephine Igo, DO;  Location: [facility] ENDOSCOPY;  Service: Pulmonary;  Laterality: N/A;   VIDEO BRONCHOSCOPY WITH ENDOBRONCHIAL ULTRASOUND  03/15/2020   Procedure: VIDEO BRONCHOSCOPY WITH ENDOBRONCHIAL ULTRASOUND;  Surgeon: Josephine Igo, DO;  Location: [facility] ENDOSCOPY;  Service: Pulmonary;;   WISDOM TOOTH EXTRACTION      Allergies  Allergies  Allergen Reactions   Amlodipine     Cramps    Codeine     REACTION: headaches   Gabapentin     headache   Prozac [Fluoxetine Hcl] Other (See Comments)    Suicidal ideations     Labs/Other Studies Reviewed    The following studies were reviewed today:  Cardiac Studies & Procedures          CT SCANS  CT CORONARY MORPH W/CTA COR W/SCORE 09/03/2022  Addendum 09/03/2022 10:57  PM ADDENDUM REPORT: 09/03/2022 22:55  EXAM: OVER-READ INTERPRETATION  CT CHEST  The following report is an over-read performed by radiologist Dr. Donetta Potts [facility] on 09/03/2022. This over-read does not include interpretation of cardiac or coronary anatomy or pathology. The cardiac/coronary CTA interpretation by the cardiologist is attached.  COMPARISON:  09/25/2021.  FINDINGS: Heart is borderline enlarged and there is no pericardial effusion. Three-vessel coronary artery calcifications are noted. There is mild atherosclerotic calcification of the aorta without evidence of aneurysm. The pulmonary trunk is normal in caliber.  No mediastinal lymphadenopathy. The visualized portion of the esophagus is within normal limits.  Mild centrilobular emphysematous changes are present in the lungs. Atelectasis or scarring is noted at the lung bases. No effusion or pneumothorax.  Fatty infiltration of the liver is noted.  No acute abnormality.  Degenerative changes are noted in the thoracic spine. No acute osseous abnormality.  IMPRESSION: 1. Emphysema. 2. Coronary artery calcifications. 3. Aortic atherosclerosis. 4. Hepatic steatosis.   Electronically Signed  By: Thornell Sartorius M.D. On: 09/03/2022 22:55  Narrative CLINICAL DATA:  67 year old with chest pain  EXAM: Cardiac/Coronary  CTA  TECHNIQUE: The patient was scanned on a [employer].  FINDINGS: A 120 kV prospective scan was triggered in the descending thoracic aorta at 111 HU's. Axial non-contrast 3 mm slices were carried out through the heart. The data set was analyzed on a dedicated work station and scored using the Agatson method. Gantry rotation speed was 250 msecs and collimation was .6 mm. 0.8 mg of sl NTG was given. The 3D data set was reconstructed in 5% intervals of the 67-82 % of the R-R cycle. Diastolic phases were analyzed on a dedicated work station using MPR, MIP and VRT  modes. The patient received 80 cc of contrast.  Image quality: good  Aorta:  Normal size.  No calcifications.  No dissection.  Aortic Valve:  Trileaflet.  No calcifications.  Coronary Arteries:  Normal coronary origin.  Right dominance.  RCA is a large dominant artery that gives rise to PDA and PLA. There is dense proximal/mid mixed plaque 70-99% stenosis. FFR 0.98 to 0.83 mid, and 0.83 to 0.65 mid to distal (abnormal).  Left main is a large artery that gives rise to LAD and LCX arteries.  LAD is a large vessel that has dense mixed plaque proximal/mid with 30-49% stenosis and tapering of mid vessel. No FFR modeling of mid to distal vessel.  D1-small caliber bifurcating vessel with calcified plaque.  LCX is a non-dominant artery. There is diffuse mixed plaque, mild stenosis.  OM1-moderate sized vessel.  No stenosis.  Other findings:  Normal pulmonary vein drainage into the left atrium.  Normal left atrial appendage without a thrombus.  Normal size of the pulmonary artery.  Please see radiology report for non cardiac findings.  IMPRESSION: 1. Coronary calcium score of 2262. This was 30 percentile for age and sex matched control.  2. Total plaque volume (TPV) 1356 mm3 which is 97 percentile for age-and sex matched controls (calcified plaque 526 mm3; non-calcified plaque 830 mm3). TPV is extensive.  3. Normal coronary origin with right dominance.  4. Severe CAD of mid RCA (FFR abnormal), mild proximal LAD and CX. Possible significant disease of mid LAD to distal (not modeled by FFR).  Electronically Signed: By: Donato Schultz M.D. On: 08/29/2022 14:58         Recent Labs: 09/17/2022: BUN 12; Creatinine, Ser 0.87; Potassium 4.6; Sodium 142  Recent Lipid Panel    Component Value Date/Time   CHOL 118 09/28/2019 1211   CHOL 142 07/02/2015 0924   TRIG 90 09/28/2019 1211   HDL 45 (L) 09/28/2019 1211   HDL 58 07/02/2015 0924   CHOLHDL 2.6 09/28/2019 1211   VLDL 26  09/19/2016 1512   LDLCALC 56 09/28/2019 1211   LDLDIRECT 104 (H) 06/12/2010 1641    History of Present Illness    67 year old female with the above past medical history including CAD, hypertension, hyperlipidemia, hypothyroidism, type 2 diabetes, CVA, OSA, COPD and GERD.   She has a history of COPD, former tobacco use, follows with pulmonology.  She has a strong family history of CAD. She contacted our office on 08/18/2022 and noted that she had an echocardiogram with her PCP who recommended follow-up with cardiology based on the results.  Echocardiogram ultimately showed EF 60 to 65%, normal LV systolic function, mild LVH, G1 DD, no significant valvular abnormalities. Coronary CT angiogram in 08/2022 revealed coronary calcium score 2262 (99th  percentile), extensive TPV, severe CAD of the mid RCA with abnormal FFR, mild proximal LAD and left circumflex stenoses, possible significant disease of the mid LAD to distal LAD (not mottled by FFR).  Findings were discussed with Dr. Herbie Baltimore- in the absence of symptoms initial trial of medical therapy was advised.  She was started on Imdur.  Aspirin, high-dose statin, and beta-blocker were also advised.  Given statin intolerance, she was referred to lipid clinic Pharm.D. for consideration of PCSK9 inhibitor.  It was noted that should she fail medical therapy, cardiac catheterization could be advised.    She presents today for follow-up.  Since her last visit she has been stable from a cardiac standpoint.  She has had 1 or 2 episodes of fleeting chest discomfort that occurred while she was sleeping,  she denies any exertional chest pain.  She has stable intermittent dyspnea in the setting of COPD, unchanged from prior visits.  She has nonpitting bilateral lower extremity edema, controlled with as needed Lasix.  She denies PND, orthopnea, weight gain. She is getting ready to participate in a pilates exercise class.  Overall, she reports feeling well.  Home Medications     Current Outpatient Medications  Medication Sig Dispense Refill   albuterol (PROAIR HFA) 108 (90 Base) MCG/ACT inhaler INHALE 2 PUFFS EVERY 6  HOURS AS NEEDED FOR  SHORTNESS OF BREATH OR  WHEEZING (Patient taking differently: Inhale 2 puffs into the lungs every 6 (six) hours as needed for wheezing.) 34 g 3   alum & mag hydroxide-simeth (MAALOX/MYLANTA) 200-200-20 MG/5ML suspension Take 30 mLs by mouth every 6 (six) hours as needed for indigestion or heartburn.     AMBULATORY NON FORMULARY MEDICATION Medication Name: GI cocktail  90 ML viscous lidocaine  90 ML 10 mg/5 ml Dicyclomine  270 ML Maalox  Swish and swallow 30 ML four times daily for 3 days 400 mL 0   aspirin EC 81 MG tablet Take 81 mg by mouth every 6 (six) hours as needed for moderate pain. Swallow whole.     Aspirin-Acetaminophen (GOODYS BODY PAIN PO) Take 1 Package by mouth daily as needed (pain/headache).     bisoprolol (ZEBETA) 10 MG tablet Take 10 mg by mouth daily.     calcium carbonate (TUMS - DOSED IN MG ELEMENTAL CALCIUM) 500 MG chewable tablet Chew 1 tablet by mouth daily as needed for indigestion or heartburn.     COLLAGEN-VITAMIN C PO Take 3 tablets by mouth in the morning and at bedtime.     estradiol (ESTRACE VAGINAL) 0.1 MG/GM vaginal cream Place 1 Applicatorful vaginally at bedtime. (Patient taking differently: Place 1 Applicatorful vaginally 2 (two) times a week.) 42.5 g 12   ezetimibe (ZETIA) 10 MG tablet Take 10 mg by mouth daily.     Fluticasone-Umeclidin-Vilant (TRELEGY ELLIPTA) 100-62.5-25 MCG/ACT AEPB Inhale 1 puff into the lungs daily. 180 each 3   furosemide (LASIX) 40 MG tablet Take 1 tab daily as needed for weight gain of 3 lb over night or 5 lb in 1 week. 90 tablet 3   glipiZIDE (GLUCOTROL XL) 5 MG 24 hr tablet TAKE 1 TABLET BY MOUTH  DAILY WITH BREAKFAST (Patient taking differently: Take 5 mg by mouth every evening.) 90 tablet 1   glucose blood (ONETOUCH VERIO) test strip Use to check blood sugar twice  daily. Dx: E11.9 300 each 1   guaiFENesin (MUCINEX) 600 MG 12 hr tablet Take 600 mg by mouth 2 (two) times daily as needed for to loosen  phlegm or cough.     hydrALAZINE (APRESOLINE) 50 MG tablet TAKE 1 TABLET BY MOUTH 3  TIMES DAILY (Patient taking differently: Take 50 mg by mouth 3 (three) times daily.) 270 tablet 3   ibuprofen (ADVIL) 200 MG tablet Take 800 mg by mouth every 8 (eight) hours as needed for moderate pain.     ipratropium-albuterol (DUONEB) 0.5-2.5 (3) MG/3ML SOLN Take 3 mLs by nebulization every 6 (six) hours as needed. 360 mL 6   isosorbide mononitrate (IMDUR) 30 MG 24 hr tablet Take 1 tablet (30 mg total) by mouth daily. 30 tablet 1   Lancets (ONETOUCH DELICA PLUS LANCET33G) MISC USE TO CHECK BLOOD SUGAR  TWICE DAILY 200 each 3   levothyroxine (SYNTHROID) 137 MCG tablet TAKE 1 TABLET BY MOUTH  DAILY BEFORE BREAKFAST (Patient taking differently: Take 137 mcg by mouth daily before breakfast.) 90 tablet 2   losartan (COZAAR) 100 MG tablet TAKE 1 TABLET BY MOUTH  DAILY (Patient taking differently: Take 100 mg by mouth every evening.) 90 tablet 3   metoprolol tartrate (LOPRESSOR) 100 MG tablet Take 1 tab 2 hours prior to procedure. 1 tablet 0   omeprazole (PRILOSEC) 40 MG capsule Take 1 capsule (40 mg total) by mouth 2 (two) times daily before a meal. Take 30- 60 min before your first and last meals of the day 60 capsule 11   pregabalin (LYRICA) 50 MG capsule Take 50 mg by mouth daily.     Respiratory Therapy Supplies (NEBULIZER/TUBING/MOUTHPIECE) KIT 1 application by Does not apply route every 6 (six) hours as needed. FAX TO [phone] 1 kit 11   valACYclovir (VALTREX) 500 MG tablet TAKE 1 TABLET BY MOUTH  DAILY (Patient taking differently: Take 500 mg by mouth daily.) 90 tablet 3   sucralfate (CARAFATE) 1 GM/10ML suspension Take 10 mLs (1 g total) by mouth 4 (four) times daily. 1200 mL 2   No current facility-administered medications for this visit.     Review of Systems   She  denies palpitations, pnd, orthopnea, n, v, dizziness, syncope, weight gain, or early satiety. All other systems reviewed and are otherwise negative except as noted above.   Physical Exam    VS:  BP (!) 142/68 (BP Location: Left Arm, Patient Position: Sitting, Cuff Size: Normal)   Pulse 76   Ht 5\' 3"  (1.6 m)   Wt 194 lb 12.8 oz (88.4 kg)   LMP 02/27/2014   SpO2 94%   BMI 34.51 kg/m   GEN: Well nourished, well developed, in no acute distress. HEENT: normal. Neck: Supple, no JVD, carotid bruits, or masses. Cardiac: RRR, no murmurs, rubs, or gallops. No clubbing, cyanosis, nonpitting bilateral lower extremity edema.  Radials/DP/PT 2+ and equal bilaterally.  Respiratory:  Respirations regular and unlabored, clear to auscultation bilaterally. GI: Soft, nontender, nondistended, BS + x 4. MS: no deformity or atrophy. Skin: warm and dry, no rash. Neuro:  Strength and sensation are intact. Psych: Normal affect.  Accessory Clinical Findings    ECG personally reviewed by me today -    No EKG in office today.   Lab Results  Component Value Date   WBC 7.7 05/29/2020   HGB 13.4 05/29/2020   HCT 40.7 05/29/2020   MCV 92.1 05/29/2020   PLT 223.0 05/29/2020   Lab Results  Component Value Date   CREATININE 0.87 09/17/2022   BUN 12 09/17/2022   NA 142 09/17/2022   K 4.6 09/17/2022   CL 105 09/17/2022   CO2 24 09/17/2022  Lab Results  Component Value Date   ALT 19 09/28/2019   AST 19 09/28/2019   ALKPHOS 80 02/15/2016   BILITOT 0.2 09/28/2019   Lab Results  Component Value Date   CHOL 118 09/28/2019   HDL 45 (L) 09/28/2019   LDLCALC 56 09/28/2019   LDLDIRECT 104 (H) 06/12/2010   TRIG 90 09/28/2019   CHOLHDL 2.6 09/28/2019    Lab Results  Component Value Date   HGBA1C 6.0 (H) 09/28/2019    Assessment & Plan    1. CAD: Coronary CT angiogram in 08/2022 revealed coronary calcium score 2262 (99th percentile), extensive TPV, severe CAD of the mid RCA with abnormal FFR, mild  proximal LAD and left circumflex stenoses, possible significant disease of the mid LAD to distal LAD (not mottled by FFR).  Findings were discussed with Dr. Herbie Baltimore in the absence of symptoms, initial trial of medical therapy was advised.  She was started on Imdur.this is a fleeting chest discomfort that occurred while she was sleeping, denies exertional symptoms.  Continue to monitor.  Should she have progressive symptoms concerning for angina, could consider cardiac catheterization.  She is pending approval for PCSK9 inhibitor.  Continue aspirin, bisoprolol, losartan, hydralazine, Imdur, and Zetia.   2.  LVH/diastolic dysfunction:  Echocardiogram in 07/2022 showed EF 60 to 65%, normal LV systolic function, mild LVH, G1 DD, no significant valvular abnormalities.  She has stable nonpitting bilateral pedal edema.  She has not seen much improvement with Lasix 20 mg daily.  Will increase Lasix to 40 mg daily as needed for swelling, weight gain.  Otherwise, euvolemic and well compensated on exam.  3. Hypertension: BP well controlled. Continue current antihypertensive regimen.   3. Hyperlipidemia/statin intolerance: No recent LDL on file.  Will request most recent labs from PCP.  She has a history of statin intolerance.  She is following with lipid clinic Pharm.D. for approval of PCSK9 inhibitor.  I have re-requested most recent cholesterol panel from PCP today.   4. Type 2 diabetes: No recent A1c available for review.  Monitored and managed per PCP.     5. History of CVA: No recurrence. Continue aspirin.   6. OSA: Non-adherent to CPAP.    7. COPD: He has stable intermittent dyspnea.  I provided her with a handicap placard application today.  Follows with pulmonology.   8. Disposition: Follow-up in 3 to 4 months.   Joylene Grapes, NP 10/15/2022, 11:05 AM

## 2022-10-15 NOTE — Patient Instructions (Signed)
Medication Instructions:  Increase Lasix 40 mg daily as needed for swelling or weight gain of 3 lb overnight or 5 lb in 1 week.  *If you need a refill on your cardiac medications before your next appointment, please call your pharmacy*   Lab Work: NONE ordered at this time of appointment   Testing/Procedures: NONE ordered at this time of appointment     Follow-Up: At [facility], you and your health needs are our priority.  As part of our continuing mission to provide you with exceptional heart care, we have created designated Provider Care Teams.  These Care Teams include your primary Cardiologist (physician) and Advanced Practice Providers (APPs -  Physician Assistants and Nurse Practitioners) who all work together to provide you with the care you need, when you need it.  We recommend signing up for the patient portal called "MyChart".  Sign up information is provided on this After Visit Summary.  MyChart is used to connect with patients for Virtual Visits (Telemedicine).  Patients are able to view lab/test results, encounter notes, upcoming appointments, etc.  Non-urgent messages can be sent to your provider as well.   To learn more about what you can do with MyChart, go to [email or web address].    Your next appointment:   3-4 month(s)  Provider:   Bernadene Person, NP        Other Instructions

## 2022-10-15 NOTE — Patient Instructions (Addendum)
It was nice meeting you today  We would like your LDL (bad cholesterol) to be less than 55  Please continue your ezetimibe 10mg  daily  We would like to start a new medication called Repatha or Praluent which you would inject once every 2 weeks  I will complete the prior authorization for you and contact you when it is approved  Please have [employer] send over your last cholesterol lab work  Once you start the new medication we will recheck your cholesterol in 2-3 months  Please message or call with any questions  Laural Golden, PharmD, BCACP, CDCES, CPP [address] [place], [place], [address] Phone: [phone], Fax: [phone]

## 2022-10-15 NOTE — Telephone Encounter (Signed)
Please complete PA for Repatha. Do not have current labs but patient has CAD, DM, and CVA. Let me know if it requires new labs

## 2022-10-15 NOTE — Progress Notes (Signed)
Patient ID: Grace [name]                 DOB: 10/03/1955                    MRN: [id number]     HPI: Grace [name] is a 67 y.o. female patient referred to lipid clinic by Bernadene Person. PMH is significant for CAD, elevated CAC, PFO, OSA, T2DM, CVA, obesity, and history of smoking.  Patient recently has coronary CT and had score of 2262 placing her in 99th percentile for age and sex matched control.  Patient presents today. Reports she feels well. Has occasional chest pain. Recently started isosorbide but does not take often because it causes her headaches.  Currently managed on atorvastatin 80mg  with no adverse effects reported. Do not have current lab work despite requests from PCP office.   Quit smoking in 2017. Has COPD and is trying to exercise more before she becomes out of breath. Has found chair exercises on Youtube she is using and has purchased a fitbit.   Current Medications: Atorvastatin 80mg  daily  Intolerances: N/A  Risk Factors:  Hx of CVA T2DM Elevated CAC score Former smoker  LDL goal: <55  Labs: Unknown  Imaging:  1. Coronary calcium score of 2262. This was 99 percentile for ageand sex matched control.   2. Total plaque volume (TPV) 1356 mm3 which is 97 percentile for age-and sex matched controls (calcified plaque 526 mm3; non-calcified plaque 830 mm3). TPV is extensive.   3. Normal coronary origin with right dominance.   4. Severe CAD of mid RCA (FFR abnormal), mild proximal LAD and CX. Possible significant disease of mid LAD to distal (not modeled by FFR).  Past Medical History:  Diagnosis Date   Abnormal vaginal bleeding    Anxiety    Asthma    Barrett's esophagus    with high grade dysplasia per endoscopy 05/2005 // followed by Dr. Stan Head ([facility])   Chronic back pain    COPD (chronic obstructive pulmonary disease) (HCC)    Diabetes mellitus without complication (HCC)    type 2   Duodenal ulcer disease    thought to be contributed at  least partly by overuse of NSAIDs and salicylates (goody powdr)   Endometrial polyp 03/2003   s/p resection in 03/2003. 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   Migraine    Nevus    Personal history of failed conscious sedation 08/11/2011   Sleep apnea    does not use cpap   Stroke (HCC) 10/2000   Tachycardia    Thyroid disease    Tobacco abuse Jan 2014   quit  Current Outpatient Medications on File Prior to Visit  Medication Sig Dispense Refill   metoprolol tartrate (LOPRESSOR) 100 MG tablet Take 1 tab 2 hours prior to procedure. 1 tablet 0   albuterol (PROAIR HFA) 108 (90 Base) MCG/ACT inhaler INHALE 2 PUFFS EVERY 6  HOURS AS NEEDED FOR  SHORTNESS OF BREATH OR  WHEEZING (Patient taking differently: Inhale 2 puffs into the lungs every 6 (six) hours as needed for wheezing.) 34 g 3   alum & mag hydroxide-simeth (MAALOX/MYLANTA) 200-200-20 MG/5ML suspension Take 30 mLs by mouth every 6 (six) hours as needed for indigestion or heartburn.     AMBULATORY NON FORMULARY MEDICATION Medication Name: GI cocktail  90 ML viscous lidocaine  90 ML 10 mg/5 ml Dicyclomine  270 ML Maalox  Swish and swallow 30 ML four times daily for 3 days 400 mL 0   aspirin EC 81 MG tablet Take 81 mg by mouth every 6 (six) hours  as needed for moderate pain. Swallow whole.     Aspirin-Acetaminophen (GOODYS BODY PAIN PO) Take 1 Package by mouth daily as needed (pain/headache).     atorvastatin (LIPITOR) 80 MG tablet TAKE 1 TABLET BY MOUTH ONCE DAILY AT BEDTIME FOR  CHOLESTEROL (Patient not taking: Reported on 08/20/2022) 90 tablet 3   Biotin 5000 MCG TABS Take 5,000 mcg by mouth daily. (Patient not taking: Reported on 08/20/2022)     Biotin w/ Vitamins C & E (HAIR/SKIN/NAILS PO) Take 1 tablet by mouth daily. (Patient not taking: Reported on 08/20/2022)     bisoprolol (ZEBETA) 10 MG tablet Take 10 mg by mouth daily. (Patient not taking: Reported on 08/20/2022)     calcium carbonate (TUMS - DOSED IN MG ELEMENTAL CALCIUM) 500 MG chewable tablet Chew 1 tablet by mouth daily as needed for indigestion or heartburn.     CINNAMON PO Take 1,200 mg by mouth daily. (Patient not taking: Reported on 08/20/2022)     COLLAGEN PO Take 1 Scoop by mouth daily.     COLLAGEN-VITAMIN C PO Take 3 tablets by mouth in the morning and at bedtime.     estradiol (ESTRACE VAGINAL) 0.1 MG/GM vaginal cream Place 1 Applicatorful vaginally at bedtime. (Patient taking differently: Place 1 Applicatorful vaginally 2 (two) times a week.) 42.5 g 12   Fluticasone-Umeclidin-Vilant (TRELEGY ELLIPTA) 100-62.5-25 MCG/ACT AEPB Inhale 1 puff into the lungs daily. 180 each 3   furosemide (LASIX) 20 MG tablet Take 1 tablet (20 mg total) by mouth daily. Take 2 tablets (40 mg total) for 3 days, THEN 20 mg daily as needed for swelling, weight gain of 3 pounds overnight, 5 pounds in a week 90 tablet 3   glipiZIDE (GLUCOTROL XL) 5 MG 24 hr tablet TAKE 1 TABLET BY MOUTH  DAILY WITH BREAKFAST (Patient taking differently: Take 5 mg by mouth every evening.) 90 tablet 1   glucose blood (ONETOUCH VERIO) test strip Use to check blood sugar twice daily. Dx: E11.9 300 each 1   guaiFENesin (MUCINEX) 600 MG 12 hr tablet Take 600 mg by mouth 2 (two) times daily as needed for to loosen phlegm or  cough.     hydrALAZINE (APRESOLINE) 50 MG tablet TAKE 1 TABLET BY MOUTH 3  TIMES DAILY (Patient taking differently: Take 50 mg by mouth 3 (three) times daily.) 270 tablet 3   ibuprofen (ADVIL) 200 MG tablet Take 800 mg by mouth every 8 (eight) hours as needed for moderate pain.     ipratropium-albuterol (DUONEB) 0.5-2.5 (3) MG/3ML SOLN Take  3 mLs by nebulization every 6 (six) hours as needed. 360 mL 6   isosorbide mononitrate (IMDUR) 30 MG 24 hr tablet Take 1 tablet (30 mg total) by mouth daily. 30 tablet 1   Lancets (ONETOUCH DELICA PLUS LANCET33G) MISC USE TO CHECK BLOOD SUGAR  TWICE DAILY 200 each 3   levothyroxine (SYNTHROID) 137 MCG tablet TAKE 1 TABLET BY MOUTH  DAILY BEFORE BREAKFAST (Patient taking differently: Take 137 mcg by mouth daily before breakfast.) 90 tablet 2   losartan (COZAAR) 100 MG tablet TAKE 1 TABLET BY MOUTH  DAILY (Patient taking differently: Take 100 mg by mouth every evening.) 90 tablet 3   Magnesium 250 MG TABS Take 400 mg by mouth 2 (two) times daily. (Patient not taking: Reported on 08/20/2022)     Melatonin 10 MG TABS Take 10 mg by mouth at bedtime as needed (sleep). (Patient not taking: Reported on 08/20/2022)     Misc Natural Products (RESVERATROL ULTRA PO) Take 1,200 mg by mouth 2 (two) times daily. (Patient not taking: Reported on 08/20/2022)     Multiple Vitamins-Minerals (HAIR SKIN AND NAILS FORMULA) TABS Take 3 tablets by mouth daily. (Patient not taking: Reported on 08/20/2022)     Multiple Vitamins-Minerals (ONE-A-DAY WOMENS) tablet Take 1 tablet by mouth daily.     Non Gelatin Capsules, Empty, (CAPSULE 0 CLEAR VEGGIE) CAPS 1 capsule by Does not apply route 2 (two) times daily. (Patient not taking: Reported on 08/20/2022)     omeprazole (PRILOSEC) 40 MG capsule Take 1 capsule (40 mg total) by mouth 2 (two) times daily before a meal. Take 30- 60 min before your first and last meals of the day 60 capsule 11   Potassium 99 MG TABS Take 1 tablet by mouth 2 (two) times daily.  (Patient not taking: Reported on 08/20/2022)     Respiratory Therapy Supplies (NEBULIZER/TUBING/MOUTHPIECE) KIT 1 application by Does not apply route every 6 (six) hours as needed. FAX TO [phone] 1 kit 11   sucralfate (CARAFATE) 1 GM/10ML suspension Take 10 mLs (1 g total) by mouth 4 (four) times daily. 1200 mL 2   valACYclovir (VALTREX) 500 MG tablet TAKE 1 TABLET BY MOUTH  DAILY (Patient taking differently: Take 500 mg by mouth daily.) 90 tablet 3   [DISCONTINUED] ipratropium (ATROVENT HFA) 17 MCG/ACT inhaler Inhale 2 puffs into the lungs 4 (four) times daily. 1 Inhaler 6   No current facility-administered medications on file prior to visit.    Allergies  Allergen Reactions   Amlodipine     Cramps    Codeine     REACTION: headaches   Gabapentin     headache   Prozac [Fluoxetine Hcl] Other (See Comments)    Suicidal ideations    Assessment/Plan:  1. Hyperlipidemia - Patient last LDL unknown. Labs drawn in 2021. Current results requested again.  Regardless, patient will need further therapy due to elevated coronary calcium score. Recommend PCSK9i. Goal LDL <55 due to CAC, T2DM, and history of CVA.  Using demo pen, educated patient on mechansim of action, storage, site selection, administration, and possible adverse effects. Will complete PA and contact patient when approved. Recheck lipid panel in 2-3 months.  Suggested patient go to PCP office and pick up physical copy of labs and bring to office.  Continue atorvastatin 80mg  daily Start repatha 140mg  q 2 weeks Recheck lipid panel in 2-3 months  Laural Golden, PharmD, BCACP, CDCES, CPP [address] [place], [place], [address] Phone: [phone], Fax: [phone]

## 2022-10-17 ENCOUNTER — Encounter: Payer: Self-pay | Admitting: Pharmacist

## 2022-10-20 ENCOUNTER — Other Ambulatory Visit (HOSPITAL_COMMUNITY): Payer: Self-pay

## 2022-10-20 NOTE — Telephone Encounter (Signed)
Started PA request. Looks like plan will need to see recent LDL labs. Please order labs and route back to pool once we have updated blood work back.

## 2022-10-22 ENCOUNTER — Other Ambulatory Visit: Payer: Self-pay

## 2022-10-22 DIAGNOSIS — Z79899 Other long term (current) drug therapy: Secondary | ICD-10-CM

## 2022-10-22 DIAGNOSIS — E785 Hyperlipidemia, unspecified: Secondary | ICD-10-CM

## 2022-10-23 NOTE — Telephone Encounter (Signed)
Thank you. I can complete PA once results are received

## 2022-10-23 NOTE — Telephone Encounter (Signed)
We received some labs from pts PCP and per Bernadene Person NP, pt will need additional labs. Spoke with pt and she is going to have fasting labs completed this week.

## 2022-10-31 LAB — LIPID PANEL
Chol/HDL Ratio: 4.5 ratio — ABNORMAL HIGH (ref 0.0–4.4)
Cholesterol, Total: 171 mg/dL (ref 100–199)
HDL: 38 mg/dL — ABNORMAL LOW (ref >39)
LDL Chol Calc (NIH): 110 mg/dL — ABNORMAL HIGH (ref 0–99)
Triglycerides: 130 mg/dL (ref 0–149)
VLDL Cholesterol Cal: 23 mg/dL (ref 5–40)

## 2022-11-11 ENCOUNTER — Telehealth: Payer: Self-pay

## 2022-11-11 ENCOUNTER — Other Ambulatory Visit (HOSPITAL_COMMUNITY): Payer: Self-pay

## 2022-11-11 DIAGNOSIS — I2510 Atherosclerotic heart disease of native coronary artery without angina pectoris: Secondary | ICD-10-CM

## 2022-11-11 DIAGNOSIS — Z8673 Personal history of transient ischemic attack (TIA), and cerebral infarction without residual deficits: Secondary | ICD-10-CM

## 2022-11-11 DIAGNOSIS — E785 Hyperlipidemia, unspecified: Secondary | ICD-10-CM

## 2022-11-11 DIAGNOSIS — Z789 Other specified health status: Secondary | ICD-10-CM

## 2022-11-11 MED ORDER — REPATHA SURECLICK 140 MG/ML ~~LOC~~ SOAJ: 1.0000 mL | SUBCUTANEOUS | 1 refills | Status: DC

## 2022-11-11 NOTE — Telephone Encounter (Signed)
Pharmacy Patient Advocate Encounter   Received notification from Physician's Office that prior authorization for REPATHA is required/requested.   Insurance verification completed.   The patient is insured through [facility] .   Per test claim: PA required; PA submitted to [facility] via CoverMyMeds Key/confirmation #/EOC [id number] Status is pending

## 2022-11-11 NOTE — Telephone Encounter (Signed)
Pharmacy Patient Advocate Encounter  Received notification from [facility] that Prior Authorization for REPATHA has been APPROVED THRU 05/14/23. Ran test claim, Copay is $0

## 2022-11-11 NOTE — Addendum Note (Signed)
Addended by: Cheree Ditto on: 11/11/2022 01:40 PM   Modules accepted: Orders

## 2022-11-14 MED ORDER — ISOSORBIDE MONONITRATE ER 30 MG PO TB24: 30.0000 mg | ORAL_TABLET | Freq: Every day | ORAL | 1 refills | Status: DC

## 2022-12-29 ENCOUNTER — Other Ambulatory Visit: Payer: Self-pay | Admitting: *Deleted

## 2022-12-29 MED ORDER — ISOSORBIDE MONONITRATE ER 30 MG PO TB24: 30.0000 mg | ORAL_TABLET | Freq: Every day | ORAL | 3 refills | Status: DC

## 2022-12-31 ENCOUNTER — Other Ambulatory Visit: Payer: Self-pay

## 2022-12-31 MED ORDER — FUROSEMIDE 40 MG PO TABS: ORAL_TABLET | ORAL | 3 refills | Status: DC

## 2023-01-09 ENCOUNTER — Ambulatory Visit (INDEPENDENT_AMBULATORY_CARE_PROVIDER_SITE_OTHER): Payer: 59 | Admitting: Nurse Practitioner

## 2023-01-09 ENCOUNTER — Ambulatory Visit (INDEPENDENT_AMBULATORY_CARE_PROVIDER_SITE_OTHER): Payer: 59

## 2023-01-09 ENCOUNTER — Encounter: Payer: Self-pay | Admitting: Nurse Practitioner

## 2023-01-09 VITALS — BP 120/80 | HR 85 | Ht 63.0 in | Wt 182.6 lb

## 2023-01-09 DIAGNOSIS — U071 COVID-19: Secondary | ICD-10-CM

## 2023-01-09 DIAGNOSIS — J441 Chronic obstructive pulmonary disease with (acute) exacerbation: Secondary | ICD-10-CM | POA: Diagnosis not present

## 2023-01-09 DIAGNOSIS — J019 Acute sinusitis, unspecified: Secondary | ICD-10-CM | POA: Diagnosis not present

## 2023-01-09 DIAGNOSIS — R062 Wheezing: Secondary | ICD-10-CM

## 2023-01-09 DIAGNOSIS — G4733 Obstructive sleep apnea (adult) (pediatric): Secondary | ICD-10-CM | POA: Diagnosis not present

## 2023-01-09 MED ORDER — AMOXICILLIN-POT CLAVULANATE 875-125 MG PO TABS: 1.0000 | ORAL_TABLET | Freq: Two times a day (BID) | ORAL | 0 refills | Status: AC

## 2023-01-09 MED ORDER — BENZONATATE 200 MG PO CAPS: 200.0000 mg | ORAL_CAPSULE | Freq: Three times a day (TID) | ORAL | 1 refills | Status: DC | PRN

## 2023-01-09 MED ORDER — FLUTICASONE PROPIONATE 50 MCG/ACT NA SUSP: 2.0000 | Freq: Every day | NASAL | 2 refills | Status: DC

## 2023-01-09 MED ORDER — PREDNISONE 10 MG PO TABS: ORAL_TABLET | ORAL | 0 refills | Status: DC

## 2023-01-09 NOTE — Assessment & Plan Note (Signed)
OSA on CPAP. She has not been using it for the past few months due to being out of supplies. Needs new orders. Safe driving practices reviewed. Aware of risks of untreated OSA.

## 2023-01-09 NOTE — Progress Notes (Addendum)
@Patient  ID: Grace [name], female    DOB: 11-22-1955, 67 y.o.   MRN: [id number]  Chief Complaint  Patient presents with   Acute Visit    Pt is here for Post COVID symptoms. Pt complains of Cough with white, green, yellow sputum), runny nose, and SOB while doing activities. Pt wakes up gasping for air. Pt has not been using her CPAP due to needing supplies. Pt PCP want her to have a Sleep Study.    Referring provider: Estevan Oaks, NP  HPI: 67 year old female, former smoker followed for COPD mixed type. She is a patient of Dr. Myrlene Broker and last seen in office 10/06/2022. Past medical history significant for migraines, PFO, HTN, OSA on CPAP, GERD, Barrett's esophagus, hypothyroid, multinodular goiter, DM, HLD, obesity, insomnia, HSV.  TEST/EVENTS:  01/04/2019 HST: AHI 29.4 03/30/2020 PFT: FVC 56, FEV1 40, ratio 47, TLC 103, DLCO 95 09/29/2022 LDCT chest lung cancer screen: Atherosclerosis.  No suspicious appearing pulmonary nodules or masses.  Mild diffuse bronchial wall thickening with very mild emphysema.  10/05/2020: Sudie Grumbling with Clent Ridges NP. For 6 month follow up. Doing really well, breathing has improved. Exercising and working on weight loss. Able to do more. Rarely requires her rescue inhaler. Compliant with Trelegy. Enrolled in randomized double blinded study for RSV vaccine. Received injections 2 days ago. She had fever and headache yesterday. Reported her symptoms to the study. Goes back for follow up in 2 weeks. Repeat PFT I 6 months. Resume lung cancer screening with LDCT annually.   01/09/2023: Today - acute Patient presents today for acute visit.  She had COVID the end of August.  Did not receive an antiviral.  She saw her PCP a week or 2 later and was treated with Z-Pak and prednisone.  Unfortunately, has continued to have difficulties with productive cough, shortness of breath and feeling more fatigued.  She is also noticing an occasional wheeze.  Has a lot of sinus congestion and  drainage.  Does have some facial tenderness.  Sputum is yellow to green.  Denies any fevers, chills, hemoptysis, increased lower extremity swelling, orthopnea.  She uses Trelegy daily.  Using her rescue inhaler a few times a day. Off CPAP right now due to lack of supplies.    Allergies  Allergen Reactions   Amlodipine     Cramps    Codeine     REACTION: headaches   Gabapentin     headache   Prozac [Fluoxetine Hcl] Other (See Comments)    Suicidal ideations    Immunization History  Administered Date(s) Administered   Influenza Split 04/25/2011   Influenza Whole 02/06/2009   Influenza, Seasonal, Injecte, Preservative Fre 05/20/2012   Influenza,inj,Quad PF,6+ Mos 03/15/2013, 01/12/2015, 01/25/2016, 12/23/2016, 01/22/2018, 01/26/2019   Influenza-Unspecified 12/13/2013, 02/21/2020   PFIZER Comirnaty(Gray Top)Covid-19 Tri-Sucrose Vaccine 08/03/2020   PFIZER(Purple Top)SARS-COV-2 Vaccination 07/07/2019, 07/28/2019, 02/21/2020   Pneumococcal Polysaccharide-23 05/20/2012   Tdap 06/12/2011   Zoster, Live 12/16/2015    Past Medical History:  Diagnosis Date   Abnormal vaginal bleeding    Anxiety    Asthma    Barrett's esophagus    with high grade dysplasia per endoscopy 05/2005 // followed by Dr. Stan Head ([facility])   Chronic back pain    COPD (chronic obstructive pulmonary disease) (HCC)    Diabetes mellitus without complication (HCC)    type 2   Duodenal ulcer disease    thought to be contributed at least partly by overuse of NSAIDs and salicylates (  goody powdr)   Endometrial polyp 03/2003   s/p resection in 03/2003. Path showing submucosal leiomyoma and benign  proliferative type endometrium   Family history of premature CAD 05/19/2018   Fibroid uterus    s/p myomectomy x 2   GERD (gastroesophageal reflux disease)    H/O failed moderate sedation    Hiatal hernia    History of CVA (cerebrovascular accident) 10/2000   History of pineal cyst    11 mm cystic mass noted in the  pineal gland per MRI in 2001 - most consitent with simple pineal cyst   HSV infection    Hyperlipidemia    Hypertension    Hypothyroidism    with hx of multinodular goiter (noted on neck US in 11/2002 - showing diffuse nodularity and inhomogenous texture diffusely BL)   Internal hemorrhoids    grade 2 per colonoscopy in 09/2006 - repeat colonoscopy rec in 5-10 years.   Migraine    Nevus    Personal history of failed conscious sedation 08/11/2011   Sleep apnea    does not use cpap   Stroke (HCC) 10/2000   Tachycardia    Thyroid disease    Tobacco abuse Jan 2014   quit    Tobacco History: Social History   Tobacco Use  Smoking Status Former   Current packs/day: 0.00   Average packs/day: 3.0 packs/day for 50.0 years (150.0 ttl pk-yrs)   Types: Cigarettes   Start date: 09/22/1965   Quit date: 09/23/2015   Years since quitting: 7.3  Smokeless Tobacco Never  Tobacco Comments   Counseling sheet 07-2011    Counseling given: Not Answered Tobacco comments: Counseling sheet 07-2011    Outpatient Medications Prior to Visit  Medication Sig Dispense Refill   TRELEGY ELLIPTA 200-62.5-25 MCG/ACT AEPB Inhale 1 puff into the lungs daily.     albuterol (PROAIR HFA) 108 (90 Base) MCG/ACT inhaler INHALE 2 PUFFS EVERY 6  HOURS AS NEEDED FOR  SHORTNESS OF BREATH OR  WHEEZING (Patient taking differently: Inhale 2 puffs into the lungs every 6 (six) hours as needed for wheezing.) 34 g 3   alum & mag hydroxide-simeth (MAALOX/MYLANTA) 200-200-20 MG/5ML suspension Take 30 mLs by mouth every 6 (six) hours as needed for indigestion or heartburn.     AMBULATORY NON FORMULARY MEDICATION Medication Name: GI cocktail  90 ML viscous lidocaine  90 ML 10 mg/5 ml Dicyclomine  270 ML Maalox  Swish and swallow 30 ML four times daily for 3 days 400 mL 0   aspirin EC 81 MG tablet Take 81 mg by mouth every 6 (six) hours as needed for moderate pain. Swallow whole.     Aspirin-Acetaminophen (GOODYS BODY PAIN PO) Take  1 Package by mouth daily as needed (pain/headache).     bisoprolol (ZEBETA) 10 MG tablet Take 10 mg by mouth daily.     calcium carbonate (TUMS - DOSED IN MG ELEMENTAL CALCIUM) 500 MG chewable tablet Chew 1 tablet by mouth daily as needed for indigestion or heartburn.     COLLAGEN-VITAMIN C PO Take 3 tablets by mouth in the morning and at bedtime.     estradiol (ESTRACE VAGINAL) 0.1 MG/GM vaginal cream Place 1 Applicatorful vaginally at bedtime. (Patient taking differently: Place 1 Applicatorful vaginally 2 (two) times a week.) 42.5 g 12   Evolocumab (REPATHA SURECLICK) 140 MG/ML SOAJ Inject 140 mg into the skin every 14 (fourteen) days. 6 mL 1   ezetimibe (ZETIA) 10 MG tablet Take 10 mg by mouth daily.  Fluticasone-Umeclidin-Vilant (TRELEGY ELLIPTA) 100-62.5-25 MCG/ACT AEPB Inhale 1 puff into the lungs daily. 180 each 3   furosemide (LASIX) 40 MG tablet Take 1 tab daily as needed for weight gain of 3 lb over night or 5 lb in 1 week. 90 tablet 3   glipiZIDE (GLUCOTROL XL) 5 MG 24 hr tablet TAKE 1 TABLET BY MOUTH  DAILY WITH BREAKFAST (Patient taking differently: Take 5 mg by mouth every evening.) 90 tablet 1   glucose blood (ONETOUCH VERIO) test strip Use to check blood sugar twice daily. Dx: E11.9 300 each 1   guaiFENesin (MUCINEX) 600 MG 12 hr tablet Take 600 mg by mouth 2 (two) times daily as needed for to loosen phlegm or cough.     hydrALAZINE (APRESOLINE) 50 MG tablet TAKE 1 TABLET BY MOUTH 3  TIMES DAILY (Patient taking differently: Take 50 mg by mouth 3 (three) times daily.) 270 tablet 3   ibuprofen (ADVIL) 200 MG tablet Take 800 mg by mouth every 8 (eight) hours as needed for moderate pain.     ipratropium-albuterol (DUONEB) 0.5-2.5 (3) MG/3ML SOLN Take 3 mLs by nebulization every 6 (six) hours as needed. 360 mL 6   isosorbide mononitrate (IMDUR) 30 MG 24 hr tablet Take 1 tablet (30 mg total) by mouth daily. 90 tablet 3   Lancets (ONETOUCH DELICA PLUS LANCET33G) MISC USE TO CHECK BLOOD  SUGAR  TWICE DAILY 200 each 3   levothyroxine (SYNTHROID) 137 MCG tablet TAKE 1 TABLET BY MOUTH  DAILY BEFORE BREAKFAST (Patient taking differently: Take 175 mcg by mouth daily before breakfast.) 90 tablet 2   losartan (COZAAR) 100 MG tablet TAKE 1 TABLET BY MOUTH  DAILY (Patient taking differently: Take 100 mg by mouth every evening.) 90 tablet 3   metoprolol tartrate (LOPRESSOR) 100 MG tablet Take 1 tab 2 hours prior to procedure. 1 tablet 0   omeprazole (PRILOSEC) 40 MG capsule Take 1 capsule (40 mg total) by mouth 2 (two) times daily before a meal. Take 30- 60 min before your first and last meals of the day 60 capsule 11   pregabalin (LYRICA) 50 MG capsule Take 50 mg by mouth daily.     Respiratory Therapy Supplies (NEBULIZER/TUBING/MOUTHPIECE) KIT 1 application by Does not apply route every 6 (six) hours as needed. FAX TO [phone] 1 kit 11   valACYclovir (VALTREX) 500 MG tablet TAKE 1 TABLET BY MOUTH  DAILY (Patient taking differently: Take 500 mg by mouth daily.) 90 tablet 3   sucralfate (CARAFATE) 1 GM/10ML suspension Take 10 mLs (1 g total) by mouth 4 (four) times daily. 1200 mL 2   No facility-administered medications prior to visit.     Review of Systems:   Constitutional: No weight loss or gain, night sweats, fevers, chills, or lassitude. +fatigue  HEENT: No headaches, difficulty swallowing, tooth/dental problems, or sore throat. No sneezing, itching, ear ache. +nasal congestion, post nasal drip CV:  +swelling in lower extremities (stable). No chest pain, orthopnea, PND, anasarca, dizziness, palpitations, syncope Resp: +shortness of breath with exertion; productive cough; wheeze. No hemoptysis. No chest wall deformity GI:  No heartburn, indigestion, abdominal pain, nausea, vomiting, diarrhea, change in bowel habits, loss of appetite, bloody stools.  GU: No dysuria, change in color of urine, urgency or frequency.   Skin: No rash, lesions, ulcerations MSK:  No joint pain or  swelling.   Neuro: No dizziness or lightheadedness.  Psych: No depression or anxiety. Mood stable.     Physical Exam:  BP 120/80 (BP  Location: Right Arm, Cuff Size: Normal)   Pulse 85   Ht 5\' 3"  (1.6 m)   Wt 182 lb 9.6 oz (82.8 kg)   LMP 02/27/2014   SpO2 98%   BMI 32.35 kg/m   GEN: Pleasant, interactive, well-kempt; obese; in no acute distress. HEENT:  Normocephalic and atraumatic. PERRLA. Sclera white. Nasal turbinates erythematous, moist and patent bilaterally. White rhinorrhea present. Oropharynx erythematous and moist, without exudate or edema. No lesions, ulcerations NECK:  Supple w/ fair ROM. No JVD present. Normal carotid impulses w/o bruits. Thyroid symmetrical with no goiter or nodules palpated. No lymphadenopathy.   CV: RRR, no m/r/g, +1 peripheral lower extremity edema. Pulses intact, +2 bilaterally. No cyanosis, pallor or clubbing. PULMONARY:  Unlabored, regular breathing. Minimal scattered rhonchi bilaterally A&P. Congested cough. No accessory muscle use.  GI: BS present and normoactive. Soft, non-tender to palpation. No organomegaly or masses detected.  MSK: No erythema, warmth or tenderness. Cap refil <2 sec all extrem. No deformities or joint swelling noted.  Neuro: A/Ox3. No focal deficits noted.   Skin: Warm, no lesions or rashe Psych: Normal affect and behavior. Judgement and thought content appropriate.     Lab Results:  CBC    Component Value Date/Time   WBC 7.7 05/29/2020 1600   RBC 4.42 05/29/2020 1600   HGB 13.4 05/29/2020 1600   HGB 12.1 10/08/2015 1407   HCT 40.7 05/29/2020 1600   HCT 38.1 10/08/2015 1407   PLT 223.0 05/29/2020 1600   PLT 633 (H) 10/08/2015 1407   MCV 92.1 05/29/2020 1600   MCV 90 10/08/2015 1407   MCH 30.2 03/15/2020 0703   MCHC 32.9 05/29/2020 1600   RDW 14.9 05/29/2020 1600   RDW 14.8 10/08/2015 1407   LYMPHSABS 1,778 09/28/2019 1211   LYMPHSABS 1.7 10/08/2015 1407   MONOABS 0.7 02/07/2019 1548   EOSABS 299 09/28/2019  1211   EOSABS 0.2 10/08/2015 1407   BASOSABS 26 09/28/2019 1211   BASOSABS 0.0 10/08/2015 1407    BMET    Component Value Date/Time   NA 142 09/17/2022 1035   K 4.6 09/17/2022 1035   CL 105 09/17/2022 1035   CO2 24 09/17/2022 1035   GLUCOSE 129 (H) 09/17/2022 1035   GLUCOSE 106 (H) 05/29/2020 1600   BUN 12 09/17/2022 1035   CREATININE 0.87 09/17/2022 1035   CREATININE 0.76 09/28/2019 1211   CALCIUM 9.4 09/17/2022 1035   GFRNONAA >60 03/15/2020 0703   GFRNONAA 83 09/28/2019 1211   GFRAA 96 09/28/2019 1211    BNP    Component Value Date/Time   BNP 370.9 (H) 09/25/2015 1825     Imaging:  DG Chest 2 View  Result Date: 01/09/2023 CLINICAL DATA:  Productive cough and shortness of breath EXAM: CHEST - 2 VIEW COMPARISON:  03/15/2020 FINDINGS: Normal heart size and mediastinal contours. Few linear densities at the left lung base. No consolidation, edema, effusion, or pneumothorax. IMPRESSION: Mild atelectasis at the left lung base. No consolidating pneumonia or edema. Electronically Signed   By: Tiburcio Pea M.D.   On: 01/09/2023 12:00    Administration History     None          Latest Ref Rng & Units 03/30/2020   12:58 PM 04/26/2018    1:51 PM  PFT Results  FVC-Pre L 1.75  1.56   FVC-Predicted Pre % 56  49   FVC-Post L 2.00  1.88   FVC-Predicted Post % 65  60   Pre FEV1/FVC % % 54  45   Post FEV1/FCV % % 47  46   FEV1-Pre L 0.95  0.70   FEV1-Predicted Pre % 40  29   FEV1-Post L 0.93  0.87   DLCO uncorrected ml/min/mmHg 17.71  16.94   DLCO UNC% % 92  73   DLCO corrected ml/min/mmHg 18.43    DLCO COR %Predicted % 95    DLVA Predicted % 129  115   TLC L 5.09  5.62   TLC % Predicted % 103  114   RV % Predicted % 149  185     No results found for: "NITRICOXIDE"      Assessment & Plan:   COPD with acute exacerbation (HCC) AECOPD secondary to recent COVID illness. She also has acute sinusitis symptoms. CXR today to rule out superimposed infection. She  had no improvement with z pack and previous prednisone course. Will treat her with prednisone taper and empiric augmentin. Mucociliary and cough control measures. Action plan in place. VS stable on room air. Return/ED precautions advised.   Patient Instructions  -Continue Albuterol inhaler 2 puffs or 3 mL neb every 6 hours as needed for shortness of breath or wheezing. Notify if symptoms persist despite rescue inhaler/neb use. Use nebs at least twice a day until symptoms improve. Follow with flutter valve 10 times after neb -Continue trelegy 1 puff daily. Brush tongue and rinse mouth afterwards -Continue guaifenesin (mucinex) Twice daily for cough/congestion -Continue omeprazole 1 capsule daily   -Augmentin 1 tab Twice daily for 10 days. Take with food. Take a daily probiotic or eat yogurt while on the antibiotic  -Prednisone taper. 4 tabs for 3 days, then 3 tabs for 3 days, 2 tabs for 3 days, then 1 tab for 3 days, then stop. Take in AM with food -Saline nasal rinse twice a day. Use bottled distilled water. Follow with flonase nasal spray 2 sprays each nostril in the morning about 20-30 minutes after the rinse  -Delsym 2 tsp Twice daily for cough -Benzonatate 1 capsule Three times a day as needed for cough   Orders sent for new CPAP supplies. Restart CPAP once received. We may have to repeat a sleep study for insurance to cover this.   Follow up in 3-4 weeks with Dr. Tonia Brooms or Florentina Addison Danielly Ackerley,NP. If symptoms do not improve or worsen, please contact office for sooner follow up or seek emergency care.    Acute sinusitis Failure to improve with conservative measures and previous z pack. Empiric augmentin. Add on saline rinses and intranasal steroid.   OSA on CPAP OSA on CPAP. She has not been using it for the past few months due to being out of supplies. Needs new orders. Safe driving practices reviewed. Aware of risks of untreated OSA.    I spent 35 minutes of dedicated to the care of this patient  on the date of this encounter to include pre-visit review of records, face-to-face time with the patient discussing conditions above, post visit ordering of testing, clinical documentation with the electronic health record, making appropriate referrals as documented, and communicating necessary findings to members of the patients care team.  Noemi Chapel, NP 01/09/2023  Pt aware and understands NP's role.

## 2023-01-09 NOTE — Patient Instructions (Addendum)
-  Continue Albuterol inhaler 2 puffs or 3 mL neb every 6 hours as needed for shortness of breath or wheezing. Notify if symptoms persist despite rescue inhaler/neb use. Use nebs at least twice a day until symptoms improve. Follow with flutter valve 10 times after neb -Continue trelegy 1 puff daily. Brush tongue and rinse mouth afterwards -Continue guaifenesin (mucinex) Twice daily for cough/congestion -Continue omeprazole 1 capsule daily   -Augmentin 1 tab Twice daily for 10 days. Take with food. Take a daily probiotic or eat yogurt while on the antibiotic  -Prednisone taper. 4 tabs for 3 days, then 3 tabs for 3 days, 2 tabs for 3 days, then 1 tab for 3 days, then stop. Take in AM with food -Saline nasal rinse twice a day. Use bottled distilled water. Follow with flonase nasal spray 2 sprays each nostril in the morning about 20-30 minutes after the rinse  -Delsym 2 tsp Twice daily for cough -Benzonatate 1 capsule Three times a day as needed for cough   Orders sent for new CPAP supplies. Restart CPAP once received. We may have to repeat a sleep study for insurance to cover this.   Follow up in 3-4 weeks with Dr. Tonia Brooms or Florentina Addison Hisayo Delossantos,NP. If symptoms do not improve or worsen, please contact office for sooner follow up or seek emergency care.

## 2023-01-09 NOTE — Assessment & Plan Note (Addendum)
AECOPD secondary to recent COVID illness. She also has acute sinusitis symptoms. CXR today to rule out superimposed infection. She had no improvement with z pack and previous prednisone course. Will treat her with prednisone taper and empiric augmentin. Mucociliary and cough control measures. Action plan in place. VS stable on room air. Return/ED precautions advised.   Patient Instructions  -Continue Albuterol inhaler 2 puffs or 3 mL neb every 6 hours as needed for shortness of breath or wheezing. Notify if symptoms persist despite rescue inhaler/neb use. Use nebs at least twice a day until symptoms improve. Follow with flutter valve 10 times after neb -Continue trelegy 1 puff daily. Brush tongue and rinse mouth afterwards -Continue guaifenesin (mucinex) Twice daily for cough/congestion -Continue omeprazole 1 capsule daily   -Augmentin 1 tab Twice daily for 10 days. Take with food. Take a daily probiotic or eat yogurt while on the antibiotic  -Prednisone taper. 4 tabs for 3 days, then 3 tabs for 3 days, 2 tabs for 3 days, then 1 tab for 3 days, then stop. Take in AM with food -Saline nasal rinse twice a day. Use bottled distilled water. Follow with flonase nasal spray 2 sprays each nostril in the morning about 20-30 minutes after the rinse  -Delsym 2 tsp Twice daily for cough -Benzonatate 1 capsule Three times a day as needed for cough   Orders sent for new CPAP supplies. Restart CPAP once received. We may have to repeat a sleep study for insurance to cover this.   Follow up in 3-4 weeks with Dr. Tonia Brooms or Grace Addison Reighlyn Elmes,NP. If symptoms do not improve or worsen, please contact office for sooner follow up or seek emergency care.

## 2023-01-09 NOTE — Assessment & Plan Note (Signed)
Failure to improve with conservative measures and previous z pack. Empiric augmentin. Add on saline rinses and intranasal steroid.

## 2023-01-13 ENCOUNTER — Other Ambulatory Visit: Payer: Self-pay

## 2023-01-13 DIAGNOSIS — E785 Hyperlipidemia, unspecified: Secondary | ICD-10-CM

## 2023-01-13 DIAGNOSIS — I2510 Atherosclerotic heart disease of native coronary artery without angina pectoris: Secondary | ICD-10-CM

## 2023-01-13 DIAGNOSIS — Z8673 Personal history of transient ischemic attack (TIA), and cerebral infarction without residual deficits: Secondary | ICD-10-CM

## 2023-01-13 DIAGNOSIS — Z789 Other specified health status: Secondary | ICD-10-CM

## 2023-01-14 LAB — LIPID PANEL
Chol/HDL Ratio: 2.2 {ratio} (ref 0.0–4.4)
Cholesterol, Total: 99 mg/dL — ABNORMAL LOW (ref 100–199)
HDL: 45 mg/dL (ref >39)
LDL Chol Calc (NIH): 31 mg/dL (ref 0–99)
Triglycerides: 131 mg/dL (ref 0–149)
VLDL Cholesterol Cal: 23 mg/dL (ref 5–40)

## 2023-01-14 NOTE — Progress Notes (Unsigned)
Office Visit    Patient Name: Grace [name] Date of Encounter: 01/15/2023  Primary Care Provider:  Estevan Oaks, NP Primary Cardiologist:  Bryan Lemma, MD  Chief Complaint    67 year old female with a history of CAD, hypertension, hyperlipidemia, hypothyroidism, type 2 diabetes, CVA, OSA, COPD and GERD who presents for follow-up related to CAD.    Past Medical History    Past Medical History:  Diagnosis Date   Abnormal vaginal bleeding    Anxiety    Asthma    Barrett's esophagus    with high grade dysplasia per endoscopy 05/2005 // followed by Dr. Stan Head ([facility])   Chronic back pain    COPD (chronic obstructive pulmonary disease) (HCC)    Diabetes mellitus without complication (HCC)    type 2   Duodenal ulcer disease    thought to be contributed at least partly by overuse of NSAIDs and salicylates (goody powdr)   Endometrial polyp 03/2003   s/p resection in 03/2003. 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   Migraine    Nevus    Personal history of failed conscious sedation 08/11/2011   Sleep apnea    does not use cpap   Stroke (HCC) 10/2000   Tachycardia    Thyroid disease    Tobacco abuse Jan 2014   quit   Past Surgical History:  Procedure Laterality Date    ADENOIDECTOMY     APPENDECTOMY  2017   BALLOON DILATION N/A 06/14/2020   Procedure: BALLOON DILATION;  Surgeon: Meridee Score Netty Starring., MD;  Location: [facility] ENDOSCOPY;  Service: Gastroenterology;  Laterality: N/A;   BIOPSY  02/15/2020   Procedure: BIOPSY;  Surgeon: Willis Modena, MD;  Location: [facility] ENDOSCOPY;  Service: Endoscopy;;   BIOPSY  06/14/2020   Procedure: BIOPSY;  Surgeon: Lemar Lofty., MD;  Location: [facility] ENDOSCOPY;  Service: Gastroenterology;;   BRONCHIAL BIOPSY  03/15/2020   Procedure: BRONCHIAL BIOPSIES;  Surgeon: Josephine Igo, DO;  Location: [facility] ENDOSCOPY;  Service: Pulmonary;;   BRONCHIAL BRUSHINGS  03/15/2020   Procedure: BRONCHIAL BRUSHINGS;  Surgeon: Josephine Igo, DO;  Location: [facility] ENDOSCOPY;  Service: Pulmonary;;   BRONCHIAL NEEDLE ASPIRATION BIOPSY  03/15/2020   Procedure: BRONCHIAL NEEDLE ASPIRATION BIOPSIES;  Surgeon: Josephine Igo, DO;  Location: [facility] ENDOSCOPY;  Service: Pulmonary;;   BRONCHIAL WASHINGS  03/15/2020   Procedure: BRONCHIAL WASHINGS;  Surgeon: Josephine Igo, DO;  Location: [facility] ENDOSCOPY;  Service: Pulmonary;;   CHOLECYSTECTOMY     COLONOSCOPY  02/07/2010   COLONOSCOPY WITH PROPOFOL N/A 02/15/2020   Procedure: COLONOSCOPY WITH PROPOFOL;  Surgeon: Willis Modena, MD;  Location: [facility] ENDOSCOPY;  Service: Endoscopy;  Laterality: N/A;   DILATION AND  CURETTAGE OF UTERUS     ENDOSCOPIC MUCOSAL RESECTION N/A 06/14/2020   Procedure: ENDOSCOPIC MUCOSAL RESECTION;  Surgeon: Meridee Score Netty Starring., MD;  Location: [facility] ENDOSCOPY;  Service: Gastroenterology;  Laterality: N/A;   ESOPHAGOGASTRODUODENOSCOPY (EGD) WITH PROPOFOL N/A 02/15/2020   Procedure: ESOPHAGOGASTRODUODENOSCOPY (EGD) WITH PROPOFOL;  Surgeon: Willis Modena, MD;  Location: [facility] ENDOSCOPY;  Service: Endoscopy;  Laterality: N/A;   ESOPHAGOGASTRODUODENOSCOPY (EGD) WITH PROPOFOL N/A 06/14/2020   Procedure: ESOPHAGOGASTRODUODENOSCOPY (EGD) WITH PROPOFOL;  Surgeon: Meridee Score Netty Starring., MD;  Location: [facility]  ENDOSCOPY;  Service: Gastroenterology;  Laterality: N/A;   FOREIGN BODY REMOVAL  06/14/2020   Procedure: FOREIGN BODY REMOVAL;  Surgeon: Meridee Score, Netty Starring., MD;  Location: [facility] ENDOSCOPY;  Service: Gastroenterology;;   HYSTEROSCOPY     LAPAROSCOPIC APPENDECTOMY N/A 09/23/2015   Procedure: APPENDECTOMY LAPAROSCOPIC;  Surgeon: Violeta Gelinas, MD;  Location: [facility] OR;  Service: General;  Laterality: N/A;   MYOMECTOMY  1990, 1997   x 2 - In 1997, noted to have extensive pelvic adhesions and BL tubal obstruction   POLYPECTOMY  02/15/2020   Procedure: POLYPECTOMY;  Surgeon: Willis Modena, MD;  Location: [facility] ENDOSCOPY;  Service: Endoscopy;;   TONSILLECTOMY     TRANSTHORACIC ECHOCARDIOGRAM  06/2014    EF 55-60%.  No RWMA. Gr 1 DD. Cannot R/o PFO (consider bubble study).   UPPER ESOPHAGEAL ENDOSCOPIC ULTRASOUND (EUS) N/A 06/14/2020   Procedure: UPPER ESOPHAGEAL ENDOSCOPIC ULTRASOUND (EUS);  Surgeon: Lemar Lofty., MD;  Location: [facility] ENDOSCOPY;  Service: Gastroenterology;  Laterality: N/A;   UPPER GASTROINTESTINAL ENDOSCOPY  12/18/2009   VIDEO BRONCHOSCOPY WITH ENDOBRONCHIAL NAVIGATION N/A 03/15/2020   Procedure: VIDEO BRONCHOSCOPY WITH ENDOBRONCHIAL NAVIGATION;  Surgeon: Josephine Igo, DO;  Location: [facility] ENDOSCOPY;  Service: Pulmonary;  Laterality: N/A;   VIDEO BRONCHOSCOPY WITH ENDOBRONCHIAL ULTRASOUND  03/15/2020   Procedure: VIDEO BRONCHOSCOPY WITH ENDOBRONCHIAL ULTRASOUND;  Surgeon: Josephine Igo, DO;  Location: [facility] ENDOSCOPY;  Service: Pulmonary;;   WISDOM TOOTH EXTRACTION      Allergies  Allergies  Allergen Reactions   Amlodipine     Cramps    Codeine     REACTION: headaches   Gabapentin     headache   Prozac [Fluoxetine Hcl] Other (See Comments)    Suicidal ideations     Labs/Other Studies Reviewed    The following studies were reviewed today:  Cardiac Studies & Procedures          CT SCANS  CT CORONARY MORPH W/CTA COR W/SCORE 08/29/2022  Addendum 09/03/2022 10:57  PM ADDENDUM REPORT: 09/03/2022 22:55  EXAM: OVER-READ INTERPRETATION  CT CHEST  The following report is an over-read performed by radiologist Dr. Donetta Potts [facility] on 09/03/2022. This over-read does not include interpretation of cardiac or coronary anatomy or pathology. The cardiac/coronary CTA interpretation by the cardiologist is attached.  COMPARISON:  09/25/2021.  FINDINGS: Heart is borderline enlarged and there is no pericardial effusion. Three-vessel coronary artery calcifications are noted. There is mild atherosclerotic calcification of the aorta without evidence of aneurysm. The pulmonary trunk is normal in caliber.  No mediastinal lymphadenopathy. The visualized portion of the esophagus is within normal limits.  Mild centrilobular emphysematous changes are present in the lungs. Atelectasis or scarring is noted at the lung bases. No effusion or pneumothorax.  Fatty infiltration of the liver is noted.  No acute abnormality.  Degenerative changes are noted in the thoracic spine. No acute osseous abnormality.  IMPRESSION: 1. Emphysema. 2. Coronary artery calcifications. 3. Aortic atherosclerosis. 4. Hepatic steatosis.   Electronically  Signed By: Thornell Sartorius M.D. On: 09/03/2022 22:55  Narrative CLINICAL DATA:  67 year old with chest pain  EXAM: Cardiac/Coronary  CTA  TECHNIQUE: The patient was scanned on a [employer].  FINDINGS: A 120 kV prospective scan was triggered in the descending thoracic aorta at 111 HU's. Axial non-contrast 3 mm slices were carried out through the heart. The data set was analyzed on a dedicated work station and scored using the Agatson method. Gantry rotation speed was 250 msecs and collimation was .6 mm. 0.8 mg of sl NTG was given. The 3D data set was reconstructed in 5% intervals of the 67-82 % of the R-R cycle. Diastolic phases were analyzed on a dedicated work station using MPR, MIP and VRT  modes. The patient received 80 cc of contrast.  Image quality: good  Aorta:  Normal size.  No calcifications.  No dissection.  Aortic Valve:  Trileaflet.  No calcifications.  Coronary Arteries:  Normal coronary origin.  Right dominance.  RCA is a large dominant artery that gives rise to PDA and PLA. There is dense proximal/mid mixed plaque 70-99% stenosis. FFR 0.98 to 0.83 mid, and 0.83 to 0.65 mid to distal (abnormal).  Left main is a large artery that gives rise to LAD and LCX arteries.  LAD is a large vessel that has dense mixed plaque proximal/mid with 30-49% stenosis and tapering of mid vessel. No FFR modeling of mid to distal vessel.  D1-small caliber bifurcating vessel with calcified plaque.  LCX is a non-dominant artery. There is diffuse mixed plaque, mild stenosis.  OM1-moderate sized vessel.  No stenosis.  Other findings:  Normal pulmonary vein drainage into the left atrium.  Normal left atrial appendage without a thrombus.  Normal size of the pulmonary artery.  Please see radiology report for non cardiac findings.  IMPRESSION: 1. Coronary calcium score of 2262. This was 58 percentile for age and sex matched control.  2. Total plaque volume (TPV) 1356 mm3 which is 97 percentile for age-and sex matched controls (calcified plaque 526 mm3; non-calcified plaque 830 mm3). TPV is extensive.  3. Normal coronary origin with right dominance.  4. Severe CAD of mid RCA (FFR abnormal), mild proximal LAD and CX. Possible significant disease of mid LAD to distal (not modeled by FFR).  Electronically Signed: By: Donato Schultz M.D. On: 08/29/2022 14:58         Recent Labs: 09/17/2022: BUN 12; Creatinine, Ser 0.87; Potassium 4.6; Sodium 142  Recent Lipid Panel    Component Value Date/Time   CHOL 99 (L) 01/13/2023 0831   TRIG 131 01/13/2023 0831   HDL 45 01/13/2023 0831   CHOLHDL 2.2 01/13/2023 0831   CHOLHDL 2.6 09/28/2019 1211   VLDL 26 09/19/2016 1512    LDLCALC 31 01/13/2023 0831   LDLCALC 56 09/28/2019 1211   LDLDIRECT 104 (H) 06/12/2010 1641    History of Present Illness    67 year old female with the above past medical history including CAD, hypertension, hyperlipidemia, hypothyroidism, type 2 diabetes, CVA, OSA, COPD and GERD.   She has a history of COPD, former tobacco use, follows with pulmonology.  She has a strong family history of CAD. She contacted our office on 08/18/2022 and noted that she had an echocardiogram with her PCP who recommended follow-up with cardiology based on the results.  Echocardiogram ultimately showed EF 60 to 65%, normal LV systolic function, mild LVH, G1 DD, no significant valvular abnormalities. Coronary CT angiogram in 08/2022 revealed coronary calcium score 2262 (  99th percentile), extensive TPV, severe CAD of the mid RCA with abnormal FFR, mild proximal LAD and left circumflex stenoses, possible significant disease of the mid LAD to distal LAD (not mottled by FFR).  Findings were discussed with Dr. Herbie Baltimore- in the absence of symptoms initial trial of medical therapy was advised.  She was started on Imdur.  Aspirin, high-dose statin, and beta-blocker were also advised.  Given statin intolerance, she was referred to lipid clinic Pharm.D. for consideration of PCSK9 inhibitor.  It was noted that should she fail medical therapy, cardiac catheterization could be advised.  He was last seen in the office on 10/15/2022 and noted fleeting chest discomfort, she denied exertional symptoms concerning for angina.   She presents today for follow-up.  Since her last visit she has stable overall from a cardiac standpoint.  She had COVID-19 last month and was extremely sick.  She contacted our office and attempted to communicate with me regarding this, unfortunately, never received the message.  I apologized for this miscommunication.  She has since seen her PCP and her pulmonologist.  She is currently on a course of Augmentin.  She notes  ongoing intermittent chest pain that occurs at night when she is lying in bed.  Her symptoms will last from 10 to 15 minutes at a time and resolve spontaneously, she notes episodes 2-3 times a week no she states her symptoms have improved greatly with addition of Imdur.  She denies any exertional symptoms.  BP has been well-controlled.   Home Medications    Current Outpatient Medications  Medication Sig Dispense Refill   albuterol (PROAIR HFA) 108 (90 Base) MCG/ACT inhaler INHALE 2 PUFFS EVERY 6  HOURS AS NEEDED FOR  SHORTNESS OF BREATH OR  WHEEZING (Patient taking differently: Inhale 2 puffs into the lungs every 6 (six) hours as needed for wheezing.) 34 g 3   alum & mag hydroxide-simeth (MAALOX/MYLANTA) 200-200-20 MG/5ML suspension Take 30 mLs by mouth every 6 (six) hours as needed for indigestion or heartburn.     AMBULATORY NON FORMULARY MEDICATION Medication Name: GI cocktail  90 ML viscous lidocaine  90 ML 10 mg/5 ml Dicyclomine  270 ML Maalox  Swish and swallow 30 ML four times daily for 3 days 400 mL 0   amoxicillin-clavulanate (AUGMENTIN) 875-125 MG tablet Take 1 tablet by mouth 2 (two) times daily for 10 days. 20 tablet 0   aspirin EC 81 MG tablet Take 81 mg by mouth every 6 (six) hours as needed for moderate pain. Swallow whole.     Aspirin-Acetaminophen (GOODYS BODY PAIN PO) Take 1 Package by mouth daily as needed (pain/headache).     benzonatate (TESSALON) 200 MG capsule Take 1 capsule (200 mg total) by mouth 3 (three) times daily as needed for cough. 30 capsule 1   bisoprolol (ZEBETA) 10 MG tablet Take 10 mg by mouth daily.     calcium carbonate (TUMS - DOSED IN MG ELEMENTAL CALCIUM) 500 MG chewable tablet Chew 1 tablet by mouth daily as needed for indigestion or heartburn.     COLLAGEN-VITAMIN C PO Take 3 tablets by mouth in the morning and at bedtime.     estradiol (ESTRACE VAGINAL) 0.1 MG/GM vaginal cream Place 1 Applicatorful vaginally at bedtime. (Patient taking differently:  Place 1 Applicatorful vaginally 2 (two) times a week.) 42.5 g 12   Evolocumab (REPATHA SURECLICK) 140 MG/ML SOAJ Inject 140 mg into the skin every 14 (fourteen) days. 6 mL 1   ezetimibe (ZETIA) 10 MG tablet  Take 10 mg by mouth daily.     fluticasone (FLONASE) 50 MCG/ACT nasal spray Place 2 sprays into both nostrils daily. 18.2 mL 2   Fluticasone-Umeclidin-Vilant (TRELEGY ELLIPTA) 100-62.5-25 MCG/ACT AEPB Inhale 1 puff into the lungs daily. 180 each 3   furosemide (LASIX) 40 MG tablet Take 1 tab daily as needed for weight gain of 3 lb over night or 5 lb in 1 week. 90 tablet 3   glipiZIDE (GLUCOTROL XL) 5 MG 24 hr tablet TAKE 1 TABLET BY MOUTH  DAILY WITH BREAKFAST (Patient taking differently: Take 5 mg by mouth every evening.) 90 tablet 1   glucose blood (ONETOUCH VERIO) test strip Use to check blood sugar twice daily. Dx: E11.9 300 each 1   guaiFENesin (MUCINEX) 600 MG 12 hr tablet Take 600 mg by mouth 2 (two) times daily as needed for to loosen phlegm or cough.     hydrALAZINE (APRESOLINE) 50 MG tablet TAKE 1 TABLET BY MOUTH 3  TIMES DAILY (Patient taking differently: Take 50 mg by mouth 3 (three) times daily.) 270 tablet 3   ibuprofen (ADVIL) 200 MG tablet Take 800 mg by mouth every 8 (eight) hours as needed for moderate pain.     ipratropium-albuterol (DUONEB) 0.5-2.5 (3) MG/3ML SOLN Take 3 mLs by nebulization every 6 (six) hours as needed. 360 mL 6   isosorbide mononitrate (IMDUR) 30 MG 24 hr tablet Take 2 tablets (60 mg total) by mouth daily. 180 tablet 3   Lancets (ONETOUCH DELICA PLUS LANCET33G) MISC USE TO CHECK BLOOD SUGAR  TWICE DAILY 200 each 3   levothyroxine (SYNTHROID) 137 MCG tablet TAKE 1 TABLET BY MOUTH  DAILY BEFORE BREAKFAST (Patient taking differently: Take 175 mcg by mouth daily before breakfast.) 90 tablet 2   losartan (COZAAR) 100 MG tablet TAKE 1 TABLET BY MOUTH  DAILY (Patient taking differently: Take 100 mg by mouth every evening.) 90 tablet 3   metoprolol tartrate  (LOPRESSOR) 100 MG tablet Take 1 tab 2 hours prior to procedure. 1 tablet 0   omeprazole (PRILOSEC) 40 MG capsule Take 1 capsule (40 mg total) by mouth 2 (two) times daily before a meal. Take 30- 60 min before your first and last meals of the day 60 capsule 11   predniSONE (DELTASONE) 10 MG tablet 4 tabs for 3 days, then 3 tabs for 3 days, 2 tabs for 3 days, then 1 tab for 3 days, then stop 30 tablet 0   pregabalin (LYRICA) 50 MG capsule Take 50 mg by mouth daily.     Respiratory Therapy Supplies (NEBULIZER/TUBING/MOUTHPIECE) KIT 1 application by Does not apply route every 6 (six) hours as needed. FAX TO [phone] 1 kit 11   TRELEGY ELLIPTA 200-62.5-25 MCG/ACT AEPB Inhale 1 puff into the lungs daily.     valACYclovir (VALTREX) 500 MG tablet TAKE 1 TABLET BY MOUTH  DAILY (Patient taking differently: Take 500 mg by mouth daily.) 90 tablet 3   No current facility-administered medications for this visit.     Review of Systems    She denies palpitations, dyspnea, pnd, orthopnea, n, v, dizziness, syncope, edema, weight gain, or early satiety. All other systems reviewed and are otherwise negative except as noted above.   Physical Exam    VS:  BP 136/70 (BP Location: Left Arm, Patient Position: Sitting, Cuff Size: Normal)   Pulse 79   Ht 5\' 3"  (1.6 m)   Wt 181 lb (82.1 kg)   LMP 02/27/2014   SpO2 97%   BMI  32.06 kg/m  GEN: Well nourished, well developed, in no acute distress. HEENT: normal. Neck: Supple, no JVD, carotid bruits, or masses. Cardiac: RRR, no murmurs, rubs, or gallops. No clubbing, cyanosis, edema.  Radials/DP/PT 2+ and equal bilaterally.  Respiratory:  Respirations regular and unlabored, clear to auscultation bilaterally. GI: Soft, nontender, nondistended, BS + x 4. MS: no deformity or atrophy. Skin: warm and dry, no rash. Neuro:  Strength and sensation are intact. Psych: Normal affect.  Accessory Clinical Findings    ECG personally reviewed by me today -    - no EKG in  office today.   Lab Results  Component Value Date   WBC 7.7 05/29/2020   HGB 13.4 05/29/2020   HCT 40.7 05/29/2020   MCV 92.1 05/29/2020   PLT 223.0 05/29/2020   Lab Results  Component Value Date   CREATININE 0.87 09/17/2022   BUN 12 09/17/2022   NA 142 09/17/2022   K 4.6 09/17/2022   CL 105 09/17/2022   CO2 24 09/17/2022   Lab Results  Component Value Date   ALT 19 09/28/2019   AST 19 09/28/2019   ALKPHOS 80 02/15/2016   BILITOT 0.2 09/28/2019   Lab Results  Component Value Date   CHOL 99 (L) 01/13/2023   HDL 45 01/13/2023   LDLCALC 31 01/13/2023   LDLDIRECT 104 (H) 06/12/2010   TRIG 131 01/13/2023   CHOLHDL 2.2 01/13/2023    Lab Results  Component Value Date   HGBA1C 6.0 (H) 09/28/2019    Assessment & Plan   1. CAD: Coronary CT angiogram in 08/2022 revealed coronary calcium score 2262 (99th percentile), extensive TPV, severe CAD of the mid RCA with abnormal FFR, mild proximal LAD and left circumflex stenoses, possible significant disease of the mid LAD to distal LAD (not mottled by FFR).  Findings were discussed with Dr. Herbie Baltimore in the absence of symptoms, initial trial of medical therapy was advised. Today, she notes ongoing intermittent chest pain that occurs at night when she is lying in bed.  Her symptoms will last from 10 to 15 minutes at a time and resolve spontaneously, she notes episodes 2-3 times a week though she states her symptoms have improved greatly with addition of Imdur.  She denies any exertional symptoms.  Will increase Imdur to 60 mg daily.  Continue monitor symptoms.  If symptoms persist despite medication titration, may need to consider further ischemic evaluation (possible catheterization). Continue aspirin, bisoprolol, losartan, hydralazine, Imdur, Zetia, and Repatha.   2.  LVH/diastolic dysfunction:  Echocardiogram in 07/2022 showed EF 60 to 65%, normal LV systolic function, mild LVH, G1 DD, no significant valvular abnormalities.  She has stable  nonpitting bilateral pedal edema. Otherwise, euvolemic and well compensated on exam.  Continue bisoprolol, Lasix.   3. Hypertension: BP well controlled. Continue current antihypertensive regimen.   3. Hyperlipidemia/statin intolerance: LDL was 31 in 04/2022. Continue Repatha.    4. Type 2 diabetes: No recent A1c on file. Monitored and managed per PCP.    5. History of CVA: No recurrence. Continue aspirin.   6. OSA: Non-adherent to CPAP.  She is following with pulmonology regarding possibility of obtaining new CPAP supplies. Encouraged follow-up and adherence.    7. COPD: Stable intermittent dyspnea.  Follows with pulmonology.   8. Disposition: Follow-up in 3 to 4 months.      Joylene Grapes, NP 01/15/2023, 2:00 PM

## 2023-01-15 ENCOUNTER — Encounter: Payer: Self-pay | Admitting: Nurse Practitioner

## 2023-01-15 ENCOUNTER — Ambulatory Visit: Payer: 59 | Attending: Nurse Practitioner | Admitting: Nurse Practitioner

## 2023-01-15 VITALS — BP 136/70 | HR 79 | Ht 63.0 in | Wt 181.0 lb

## 2023-01-15 DIAGNOSIS — I517 Cardiomegaly: Secondary | ICD-10-CM

## 2023-01-15 DIAGNOSIS — Z8673 Personal history of transient ischemic attack (TIA), and cerebral infarction without residual deficits: Secondary | ICD-10-CM

## 2023-01-15 DIAGNOSIS — I2510 Atherosclerotic heart disease of native coronary artery without angina pectoris: Secondary | ICD-10-CM

## 2023-01-15 DIAGNOSIS — G4733 Obstructive sleep apnea (adult) (pediatric): Secondary | ICD-10-CM

## 2023-01-15 DIAGNOSIS — J449 Chronic obstructive pulmonary disease, unspecified: Secondary | ICD-10-CM

## 2023-01-15 DIAGNOSIS — I5189 Other ill-defined heart diseases: Secondary | ICD-10-CM

## 2023-01-15 DIAGNOSIS — I10 Essential (primary) hypertension: Secondary | ICD-10-CM | POA: Diagnosis not present

## 2023-01-15 DIAGNOSIS — E785 Hyperlipidemia, unspecified: Secondary | ICD-10-CM

## 2023-01-15 DIAGNOSIS — E119 Type 2 diabetes mellitus without complications: Secondary | ICD-10-CM

## 2023-01-15 MED ORDER — ISOSORBIDE MONONITRATE ER 30 MG PO TB24: 60.0000 mg | ORAL_TABLET | Freq: Every day | ORAL | 3 refills | Status: DC

## 2023-01-15 NOTE — Patient Instructions (Signed)
Medication Instructions:  Increase Imdur (isosorbide mononitrate) 60 mg daily  *If you need a refill on your cardiac medications before your next appointment, please call your pharmacy*   Lab Work: NONE ordered at this time of appointment     Testing/Procedures: NONE ordered at this time of appointment     Follow-Up: At [facility], you and your health needs are our priority.  As part of our continuing mission to provide you with exceptional heart care, we have created designated Provider Care Teams.  These Care Teams include your primary Cardiologist (physician) and Advanced Practice Providers (APPs -  Physician Assistants and Nurse Practitioners) who all work together to provide you with the care you need, when you need it.  We recommend signing up for the patient portal called "MyChart".  Sign up information is provided on this After Visit Summary.  MyChart is used to connect with patients for Virtual Visits (Telemedicine).  Patients are able to view lab/test results, encounter notes, upcoming appointments, etc.  Non-urgent messages can be sent to your provider as well.   To learn more about what you can do with MyChart, go to [email or web address].    Your next appointment:   3 month(s)  Provider:   Bernadene Person, NP

## 2023-01-26 ENCOUNTER — Other Ambulatory Visit (HOSPITAL_BASED_OUTPATIENT_CLINIC_OR_DEPARTMENT_OTHER): Payer: Self-pay

## 2023-01-26 DIAGNOSIS — G4730 Sleep apnea, unspecified: Secondary | ICD-10-CM

## 2023-02-04 NOTE — Telephone Encounter (Signed)
Provider addressed pt's concerns during 01/15/23 OV.

## 2023-02-19 ENCOUNTER — Other Ambulatory Visit: Payer: Self-pay | Admitting: Cardiology

## 2023-02-19 DIAGNOSIS — I2510 Atherosclerotic heart disease of native coronary artery without angina pectoris: Secondary | ICD-10-CM

## 2023-02-19 DIAGNOSIS — Z789 Other specified health status: Secondary | ICD-10-CM

## 2023-02-19 DIAGNOSIS — Z8673 Personal history of transient ischemic attack (TIA), and cerebral infarction without residual deficits: Secondary | ICD-10-CM

## 2023-02-19 DIAGNOSIS — E785 Hyperlipidemia, unspecified: Secondary | ICD-10-CM

## 2023-02-25 ENCOUNTER — Telehealth: Payer: Self-pay | Admitting: *Deleted

## 2023-02-25 NOTE — Telephone Encounter (Addendum)
  Pt is scheduled for appt 02/26/23 with Florentina Addison- called her to ask her to bring CPAP SD card if using her machine. Had to [facility].

## 2023-02-26 ENCOUNTER — Ambulatory Visit: Payer: 59 | Admitting: Nurse Practitioner

## 2023-02-26 ENCOUNTER — Telehealth: Payer: Self-pay | Admitting: Nurse Practitioner

## 2023-02-26 ENCOUNTER — Encounter: Payer: Self-pay | Admitting: Nurse Practitioner

## 2023-02-26 VITALS — BP 130/76 | HR 75 | Temp 97.9°F | Ht 63.0 in | Wt 182.4 lb

## 2023-02-26 DIAGNOSIS — G4733 Obstructive sleep apnea (adult) (pediatric): Secondary | ICD-10-CM | POA: Diagnosis not present

## 2023-02-26 DIAGNOSIS — J449 Chronic obstructive pulmonary disease, unspecified: Secondary | ICD-10-CM | POA: Diagnosis not present

## 2023-02-26 DIAGNOSIS — R0609 Other forms of dyspnea: Secondary | ICD-10-CM

## 2023-02-26 DIAGNOSIS — R600 Localized edema: Secondary | ICD-10-CM

## 2023-02-26 DIAGNOSIS — J019 Acute sinusitis, unspecified: Secondary | ICD-10-CM

## 2023-02-26 DIAGNOSIS — J302 Other seasonal allergic rhinitis: Secondary | ICD-10-CM | POA: Diagnosis not present

## 2023-02-26 LAB — BASIC METABOLIC PANEL
BUN: 15 mg/dL (ref 6–23)
CO2: 25 meq/L (ref 19–32)
Calcium: 9.2 mg/dL (ref 8.4–10.5)
Chloride: 106 meq/L (ref 96–112)
Creatinine, Ser: 1.04 mg/dL (ref 0.40–1.20)
GFR: 55.5 mL/min — ABNORMAL LOW (ref >60.00)
Glucose, Bld: 99 mg/dL (ref 70–99)
Potassium: 4.4 meq/L (ref 3.5–5.1)
Sodium: 139 meq/L (ref 135–145)

## 2023-02-26 LAB — CBC WITH DIFFERENTIAL/PLATELET
Basophils Absolute: 0 10*3/uL (ref 0.0–0.1)
Basophils Relative: 0.7 % (ref 0.0–3.0)
Eosinophils Absolute: 0.2 10*3/uL (ref 0.0–0.7)
Eosinophils Relative: 3.5 % (ref 0.0–5.0)
HCT: 26.3 % — ABNORMAL LOW (ref 36.0–46.0)
Hemoglobin: 7.1 g/dL — CL (ref 12.0–15.0)
Lymphocytes Relative: 26.9 % (ref 12.0–46.0)
Lymphs Abs: 1.5 10*3/uL (ref 0.7–4.0)
MCHC: 26.9 g/dL — ABNORMAL LOW (ref 30.0–36.0)
MCV: 69.5 fL — ABNORMAL LOW (ref 78.0–100.0)
Monocytes Absolute: 0.6 10*3/uL (ref 0.1–1.0)
Monocytes Relative: 10.2 % (ref 3.0–12.0)
Neutro Abs: 3.2 10*3/uL (ref 1.4–7.7)
Neutrophils Relative %: 58.7 % (ref 43.0–77.0)
Platelets: 281 10*3/uL (ref 150.0–400.0)
RBC: 3.78 Mil/uL — ABNORMAL LOW (ref 3.87–5.11)
RDW: 21.2 % — ABNORMAL HIGH (ref 11.5–15.5)
WBC: 5.5 10*3/uL (ref 4.0–10.5)

## 2023-02-26 LAB — BRAIN NATRIURETIC PEPTIDE: Pro B Natriuretic peptide (BNP): 234 pg/mL — ABNORMAL HIGH (ref 0.0–100.0)

## 2023-02-26 MED ORDER — BREZTRI AEROSPHERE 160-9-4.8 MCG/ACT IN AERO: 2.0000 | INHALATION_SPRAY | Freq: Two times a day (BID) | RESPIRATORY_TRACT | Status: DC

## 2023-02-26 NOTE — Progress Notes (Signed)
@Patient  ID: [name], female    DOB: Aug 13, 1955, 67 y.o.   MRN: [id number]  Chief Complaint  Patient presents with   Follow-up    SOB with exertion.  Sx worse since pt had COVID in August 2024    Referring provider: Estevan Oaks, NP  HPI: 67 year old female, former smoker followed for COPD mixed type. She is a patient of Dr. Myrlene Broker and last seen in office 01/09/2023. Past medical history significant for migraines, PFO, HTN, OSA on CPAP, GERD, Barrett's esophagus, hypothyroid, multinodular goiter, DM, HLD, obesity, insomnia, HSV.  TEST/EVENTS:  01/04/2019 HST: AHI 29.4 03/30/2020 PFT: FVC 56, FEV1 40, ratio 47, TLC 103, DLCO 95 09/29/2022 LDCT chest lung cancer screen: Atherosclerosis.  No suspicious appearing pulmonary nodules or masses.  Mild diffuse bronchial wall thickening with very mild emphysema.  10/05/2020: Sudie Grumbling with Clent Ridges NP. For 6 month follow up. Doing really well, breathing has improved. Exercising and working on weight loss. Able to do more. Rarely requires her rescue inhaler. Compliant with Trelegy. Enrolled in randomized double blinded study for RSV vaccine. Received injections 2 days ago. She had fever and headache yesterday. Reported her symptoms to the study. Goes back for follow up in 2 weeks. Repeat PFT I 6 months. Resume lung cancer screening with LDCT annually.   01/09/2023: OV with Jerusha Reising NP for acute visit.  She had COVID the end of August.  Did not receive an antiviral.  She saw her PCP a week or 2 later and was treated with Z-Pak and prednisone.  Unfortunately, has continued to have difficulties with productive cough, shortness of breath and feeling more fatigued.  She is also noticing an occasional wheeze.  Has a lot of sinus congestion and drainage.  Does have some facial tenderness.  Sputum is yellow to green.  Denies any fevers, chills, hemoptysis, increased lower extremity swelling, orthopnea.  She uses Trelegy daily.  Using her rescue inhaler a few times a  day. Off CPAP right now due to lack of supplies.   02/26/2023: Today - follow up Patient presents today for follow up.  She had had COVID the end of August with unresolved AECOPD and acute sinusitis.  We treated her with empiric Augmentin and extended prednisone taper.  Chest x-ray without superimposed infection.  Her cough has significantly improved.  She was actually feeling much better overall up until the last week or so.  She feels like she is just more short winded than she has been.  It took her pretty much all weekend to clean her house because she kept having to stop to rest.  She is not noticing any wheezing.  She still having sinus congestion.  Face does not feel tender anymore.  Mucus is white to yellow.  No fevers, chills, hemoptysis.  She is having some increased lower extremity swelling and does find a little more difficult to lay flat at night.  She is using her Trelegy daily.  Using her rescue a few times a week.  Has not restarted CPAP. Does not take fluid pill daily.   Allergies  Allergen Reactions   Amlodipine     Cramps    Codeine     REACTION: headaches   Gabapentin     headache   Prozac [Fluoxetine Hcl] Other (See Comments)    Suicidal ideations    Immunization History  Administered Date(s) Administered   Influenza Split 04/25/2011   Influenza Whole 02/06/2009   Influenza, Seasonal, Injecte, Preservative Fre 05/20/2012  Influenza,inj,Quad PF,6+ Mos 03/15/2013, 01/12/2015, 01/25/2016, 12/23/2016, 01/22/2018, 01/26/2019   Influenza-Unspecified 12/13/2013, 02/21/2020   PFIZER Comirnaty(Gray Top)Covid-19 Tri-Sucrose Vaccine 08/03/2020   PFIZER(Purple Top)SARS-COV-2 Vaccination 07/07/2019, 07/28/2019, 02/21/2020   Pneumococcal Polysaccharide-23 05/20/2012   Tdap 06/12/2011   Zoster, Live 12/16/2015    Past Medical History:  Diagnosis Date   Abnormal vaginal bleeding    Anxiety    Asthma    Barrett's esophagus    with high grade dysplasia per endoscopy 05/2005  // followed by Dr. Stan Head ([facility])   Chronic back pain    COPD (chronic obstructive pulmonary disease) (HCC)    Diabetes mellitus without complication (HCC)    type 2   Duodenal ulcer disease    thought to be contributed at least partly by overuse of NSAIDs and salicylates (goody powdr)   Endometrial polyp 03/2003   s/p resection in 03/2003. 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   Migraine    Nevus    Personal history of failed conscious sedation 08/11/2011   Sleep apnea    does not use cpap   Stroke (HCC) 10/2000   Tachycardia    Thyroid disease    Tobacco abuse Jan 2014   quit    Tobacco History: Social History   Tobacco Use  Smoking Status Former   Current packs/day: 0.00   Average packs/day: 3.0 packs/day for 50.0 years (150.0 ttl pk-yrs)   Types: Cigarettes   Start date: 09/22/1965   Quit date: 09/23/2015   Years since quitting: 7.4  Smokeless Tobacco Never  Tobacco Comments   Counseling sheet 07-2011    Counseling given: Not Answered Tobacco comments: Counseling sheet 07-2011    Outpatient Medications Prior to Visit  Medication Sig Dispense Refill   albuterol (PROAIR HFA) 108 (90 Base) MCG/ACT inhaler INHALE 2 PUFFS EVERY 6  HOURS AS NEEDED FOR  SHORTNESS OF BREATH OR   WHEEZING (Patient taking differently: Inhale 2 puffs into the lungs every 6 (six) hours as needed for wheezing.) 34 g 3   AMBULATORY NON FORMULARY MEDICATION Medication Name: GI cocktail  90 ML viscous lidocaine  90 ML 10 mg/5 ml Dicyclomine  270 ML Maalox  Swish and swallow 30 ML four times daily for 3 days 400 mL 0   Aspirin-Acetaminophen (GOODYS BODY PAIN PO) Take 1 Package by mouth daily as needed (pain/headache).     bisoprolol (ZEBETA) 10 MG tablet Take 10 mg by mouth daily.     calcium carbonate (TUMS - DOSED IN MG ELEMENTAL CALCIUM) 500 MG chewable tablet Chew 1 tablet by mouth daily as needed for indigestion or heartburn.     COLLAGEN-VITAMIN C PO Take 3 tablets by mouth in the morning and  at bedtime.     estradiol (ESTRACE VAGINAL) 0.1 MG/GM vaginal cream Place 1 Applicatorful vaginally at bedtime. (Patient taking differently: Place 1 Applicatorful vaginally 2 (two) times a week.) 42.5 g 12   ezetimibe (ZETIA) 10 MG tablet Take 10 mg by mouth daily.     fluticasone (FLONASE) 50 MCG/ACT nasal spray Place 2 sprays into both nostrils daily. 18.2 mL 2   furosemide (LASIX) 40 MG tablet Take 1 tab daily as needed for weight gain of 3 lb over night or 5 lb in 1 week. 90 tablet 3   glipiZIDE (GLUCOTROL XL) 5 MG 24 hr tablet TAKE 1 TABLET BY MOUTH  DAILY WITH BREAKFAST (Patient taking differently: Take 5 mg by mouth every evening.) 90 tablet 1   glucose blood (ONETOUCH VERIO) test strip Use to check blood sugar twice daily. Dx: E11.9 300 each 1   guaiFENesin (MUCINEX) 600 MG 12 hr tablet Take 600 mg by mouth 2 (two) times daily as needed for to loosen phlegm or cough.     hydrALAZINE (APRESOLINE) 50 MG tablet TAKE 1 TABLET BY MOUTH 3  TIMES DAILY (Patient taking differently: Take 50 mg by mouth 3 (three) times daily.) 270 tablet 3   ibuprofen (ADVIL) 200 MG tablet Take 800 mg by mouth every 8 (eight) hours as needed for moderate pain.     ipratropium-albuterol (DUONEB) 0.5-2.5 (3) MG/3ML SOLN  Take 3 mLs by nebulization every 6 (six) hours as needed. 360 mL 6   isosorbide mononitrate (IMDUR) 30 MG 24 hr tablet Take 2 tablets (60 mg total) by mouth daily. 180 tablet 3   Lancets (ONETOUCH DELICA PLUS LANCET33G) MISC USE TO CHECK BLOOD SUGAR  TWICE DAILY 200 each 3   levothyroxine (SYNTHROID) 200 MCG tablet Take 200 mcg by mouth daily before breakfast.     losartan (COZAAR) 100 MG tablet TAKE 1 TABLET BY MOUTH  DAILY (Patient taking differently: Take 100 mg by mouth every evening.) 90 tablet 3   omeprazole (PRILOSEC) 40 MG capsule Take 1 capsule (40 mg total) by mouth 2 (two) times daily before a meal. Take 30- 60 min before your first and last meals of the day 60 capsule 11   predniSONE (DELTASONE) 10 MG tablet 4 tabs for 3 days, then 3 tabs for 3 days, 2 tabs for 3 days, then 1 tab for 3 days, then stop 30 tablet 0   pregabalin (LYRICA) 50 MG capsule Take 50 mg by mouth daily.     REPATHA SURECLICK 140 MG/ML SOAJ INJECT 1 PEN SUBCUTANEOUSLY  EVERY 2 WEEKS 6 mL 3   Respiratory Therapy Supplies (NEBULIZER/TUBING/MOUTHPIECE) KIT 1 application by Does not apply route every 6 (six) hours as needed. FAX TO [id number] 1 kit 11   TRELEGY ELLIPTA 200-62.5-25 MCG/ACT AEPB Inhale 1 puff into the lungs daily.     valACYclovir (VALTREX) 500 MG tablet TAKE 1 TABLET BY MOUTH  DAILY (Patient taking differently: Take 500 mg by mouth daily.) 90 tablet 3   alum & mag hydroxide-simeth (MAALOX/MYLANTA) 200-200-20 MG/5ML suspension Take 30 mLs by mouth every 6 (six) hours as needed for indigestion or heartburn. (Patient not taking: Reported on 02/26/2023)     aspirin EC 81 MG tablet Take 81 mg by mouth every 6 (six) hours as needed for moderate pain. Swallow whole. (Patient not taking: Reported on 02/26/2023)     benzonatate (TESSALON) 200 MG capsule Take 1 capsule (200 mg total) by mouth 3 (three) times daily as needed for cough. (Patient  not taking: Reported on 02/26/2023) 30 capsule 1    Fluticasone-Umeclidin-Vilant (TRELEGY ELLIPTA) 100-62.5-25 MCG/ACT AEPB Inhale 1 puff into the lungs daily. (Patient not taking: Reported on 02/26/2023) 180 each 3   levothyroxine (SYNTHROID) 137 MCG tablet TAKE 1 TABLET BY MOUTH  DAILY BEFORE BREAKFAST (Patient not taking: Reported on 02/26/2023) 90 tablet 2   metoprolol tartrate (LOPRESSOR) 100 MG tablet Take 1 tab 2 hours prior to procedure. (Patient not taking: Reported on 02/26/2023) 1 tablet 0   No facility-administered medications prior to visit.     Review of Systems:   Constitutional: No weight loss or gain, night sweats, fevers, chills, or lassitude. +fatigue  HEENT: No headaches, difficulty swallowing, tooth/dental problems, or sore throat. No sneezing, itching, ear ache. +nasal congestion, post nasal drip CV:  +swelling in lower extremities; orthopnea. No chest pain, PND, anasarca, dizziness, palpitations, syncope Resp: +shortness of breath with exertion. No productive cough. No wheeze. No hemoptysis. No chest wall deformity GI:  No heartburn, indigestion, abdominal pain, nausea, vomiting, diarrhea, change in bowel habits, loss of appetite, bloody stools.  GU: No dysuria, change in color of urine, urgency or frequency.   Skin: No rash, lesions, ulcerations MSK:  No joint pain or swelling.   Neuro: No dizziness or lightheadedness.  Psych: No depression or anxiety. Mood stable.     Physical Exam:  BP 130/76 (BP Location: Right Arm, Patient Position: Sitting, Cuff Size: Large)   Pulse 75   Temp 97.9 F (36.6 C) (Oral)   Ht 5\' 3"  (1.6 m)   Wt 182 lb 6.4 oz (82.7 kg)   LMP 02/27/2014   SpO2 98%   BMI 32.31 kg/m   GEN: Pleasant, interactive, well-kempt; obese; in no acute distress. HEENT:  Normocephalic and atraumatic. PERRLA. Sclera white. Nasal turbinates pale, moist and patent bilaterally. No sinus tenderness. No rhinorrhea present. Oropharynx pink and moist, without exudate or edema. No lesions, ulcerations NECK:   Supple w/ fair ROM. No JVD present. Normal carotid impulses w/o bruits. Thyroid symmetrical with no goiter or nodules palpated. No lymphadenopathy.   CV: RRR, no m/r/g, +2 b/l lower extremity edema. Pulses intact, +2 bilaterally. No cyanosis, pallor or clubbing. PULMONARY:  Unlabored, regular breathing. Clear bilaterally A&P w/o wheezes/rales/rhonchi. No accessory muscle use.  GI: BS present and normoactive. Soft, non-tender to palpation. No organomegaly or masses detected.  MSK: No erythema, warmth or tenderness. Cap refil <2 sec all extrem. No deformities or joint swelling noted.  Neuro: A/Ox3. No focal deficits noted.   Skin: Warm, no lesions or rashe Psych: Normal affect and behavior. Judgement and thought content appropriate.     Lab Results:  CBC    Component Value Date/Time   WBC 5.5 02/26/2023 1437   RBC 3.78 (L) 02/26/2023 1437   HGB 7.1 Repeated and verified X2. (LL) 02/26/2023 1437   HGB 12.1 10/08/2015 1407   HCT 26.3 Repeated and verified X2. (L) 02/26/2023 1437   HCT 38.1 10/08/2015 1407   PLT 281.0 02/26/2023 1437   PLT 633 (H) 10/08/2015 1407   MCV 69.5 Repeated and verified X2. (L) 02/26/2023 1437   MCV 90 10/08/2015 1407   MCH 30.2 03/15/2020 0703   MCHC 26.9 Repeated and verified X2. (L) 02/26/2023 1437   RDW 21.2 (H) 02/26/2023 1437   RDW 14.8 10/08/2015 1407   LYMPHSABS 1.5 02/26/2023 1437   LYMPHSABS 1.7 10/08/2015 1407   MONOABS 0.6 02/26/2023 1437   EOSABS 0.2 02/26/2023 1437   EOSABS 0.2 10/08/2015 1407  BASOSABS 0.0 02/26/2023 1437   BASOSABS 0.0 10/08/2015 1407    BMET    Component Value Date/Time   NA 139 02/26/2023 1437   NA 142 09/17/2022 1035   K 4.4 02/26/2023 1437   CL 106 02/26/2023 1437   CO2 25 02/26/2023 1437   GLUCOSE 99 02/26/2023 1437   BUN 15 02/26/2023 1437   BUN 12 09/17/2022 1035   CREATININE 1.04 02/26/2023 1437   CREATININE 0.76 09/28/2019 1211   CALCIUM 9.2 02/26/2023 1437   GFRNONAA >60 03/15/2020 0703   GFRNONAA  83 09/28/2019 1211   GFRAA 96 09/28/2019 1211    BNP    Component Value Date/Time   BNP 370.9 (H) 09/25/2015 1825     Imaging:  No results found.  Administration History     None          Latest Ref Rng & Units 03/30/2020   12:58 PM 04/26/2018    1:51 PM  PFT Results  FVC-Pre L 1.75  1.56   FVC-Predicted Pre % 56  49   FVC-Post L 2.00  1.88   FVC-Predicted Post % 65  60   Pre FEV1/FVC % % 54  45   Post FEV1/FCV % % 47  46   FEV1-Pre L 0.95  0.70   FEV1-Predicted Pre % 40  29   FEV1-Post L 0.93  0.87   DLCO uncorrected ml/min/mmHg 17.71  16.94   DLCO UNC% % 92  73   DLCO corrected ml/min/mmHg 18.43    DLCO COR %Predicted % 95    DLVA Predicted % 129  115   TLC L 5.09  5.62   TLC % Predicted % 103  114   RV % Predicted % 149  185     No results found for: "NITRICOXIDE"      Assessment & Plan:   COPD, severe (HCC) Severe COPD. Recent AECOPD following COVID infection. She had clinically improved; now with worsening dyspnea. Surprisingly her lung exam is clear today. Cough has resolved. Would hold off on further abx or steroids. Not much response to SABA currently. Walk test without desaturation on room air today. VS stable. Will trial change to [facility] to see if she has more benefit from use. Provided with samples. Teachback performed. Action plan in place. Check CBC to assess peripheral eosinophilia today.  Suspect dyspnea is more so related to volume overload and persistent sinus symptoms.   Patient Instructions  -Continue Albuterol inhaler 2 puffs or 3 mL neb every 6 hours as needed for shortness of breath or wheezing. Notify if symptoms persist despite rescue inhaler/neb use. Use nebs at least twice a day  -Continue guaifenesin (mucinex) Twice daily for cough/congestion -Continue omeprazole 1 capsule daily  -Continue saline rinses and flonase   -Stop trelegy. Trial Breztri 2 puffs Twice daily. Brush tongue and rinse mouth afterwards. Let me know if this  helps and I will send in a new prescription  -Start zytrec, claritin, xyzal, allerga over the counter or store brand version  -Try afrin nasal spray over the counter as needed for congestion but don't use longer than 3-5 days in a row as this can cause worsening sinus symptoms   Labs today  We may adjust your lasix once I get these back    CPAP titration ordered so we can get you restarted on CPAP We discussed how untreated sleep apnea puts an individual at risk for cardiac arrhthymias, pulm HTN, DM, stroke and increases their risk for daytime  accidents. We also briefly reviewed treatment options including weight loss, side sleeping position, oral appliance, CPAP therapy or referral to ENT for possible surgical options   Follow up in 6-8 weeks after PFT with Dr. Tonia Brooms or Philis Nettle. If symptoms do not improve or worsen, please contact office for sooner follow up or seek emergency care.    Acute sinusitis Mild improvement with previous empiric augmentin course. Continue supportive care measures. Add on daily antihistamine for allergy triggers. Short term use of nasal decongestant. Can consider CT sinus if symptoms persist. Check IgE today  Bilateral lower extremity edema Appears hypervolemic on exam. Check BNP and BMET today. Will plan to treat her with furosemide 40 mg daily for 5 days if kidney function allows. Advised to monitor weights at home and notify of weight gain of 2-3 lb overnight or 5 lb in a week.    OSA on CPAP Difficulties with mask fit. Needs new supplies. Has had trouble tolerating in past. CPAP titration study ordered. Aware of risks of untreated OSA.     I spent 35 minutes of dedicated to the care of this patient on the date of this encounter to include pre-visit review of records, face-to-face time with the patient discussing conditions above, post visit ordering of testing, clinical documentation with the electronic health record, making appropriate referrals as  documented, and communicating necessary findings to members of the patients care team.  Noemi Chapel, NP 02/27/2023  Pt aware and understands NP's role.

## 2023-02-26 NOTE — Telephone Encounter (Signed)
Hemoglobin drawn 02/26/23 = 7.1   Routing to [place] marked urgent

## 2023-02-26 NOTE — Telephone Encounter (Signed)
I called and spoke with the pt and notified of results and recommendations from [facility]  She verbalized understanding and states will go to ED

## 2023-02-26 NOTE — Telephone Encounter (Signed)
Needs to go to ED for further evaluation. Thanks.

## 2023-02-26 NOTE — Telephone Encounter (Signed)
Critical Lab

## 2023-02-26 NOTE — Patient Instructions (Addendum)
-  Continue Albuterol inhaler 2 puffs or 3 mL neb every 6 hours as needed for shortness of breath or wheezing. Notify if symptoms persist despite rescue inhaler/neb use. Use nebs at least twice a day  -Continue guaifenesin (mucinex) Twice daily for cough/congestion -Continue omeprazole 1 capsule daily  -Continue saline rinses and flonase   -Stop trelegy. Trial Breztri 2 puffs Twice daily. Brush tongue and rinse mouth afterwards. Let me know if this helps and I will send in a new prescription  -Start zytrec, claritin, xyzal, allerga over the counter or store brand version  -Try afrin nasal spray over the counter as needed for congestion but don't use longer than 3-5 days in a row as this can cause worsening sinus symptoms   Labs today  We may adjust your lasix once I get these back    CPAP titration ordered so we can get you restarted on CPAP We discussed how untreated sleep apnea puts an individual at risk for cardiac arrhthymias, pulm HTN, DM, stroke and increases their risk for daytime accidents. We also briefly reviewed treatment options including weight loss, side sleeping position, oral appliance, CPAP therapy or referral to ENT for possible surgical options   Follow up in 6-8 weeks after PFT with Dr. Tonia Brooms or Philis Nettle. If symptoms do not improve or worsen, please contact office for sooner follow up or seek emergency care.

## 2023-02-27 ENCOUNTER — Encounter: Payer: Self-pay | Admitting: Nurse Practitioner

## 2023-02-27 DIAGNOSIS — R600 Localized edema: Secondary | ICD-10-CM | POA: Insufficient documentation

## 2023-02-27 LAB — IGE: IgE (Immunoglobulin E), Serum: 31 kU/L (ref <=114)

## 2023-02-27 NOTE — Assessment & Plan Note (Signed)
Difficulties with mask fit. Needs new supplies. Has had trouble tolerating in past. CPAP titration study ordered. Aware of risks of untreated OSA.

## 2023-02-27 NOTE — Assessment & Plan Note (Signed)
Appears hypervolemic on exam. Check BNP and BMET today. Will plan to treat her with furosemide 40 mg daily for 5 days if kidney function allows. Advised to monitor weights at home and notify of weight gain of 2-3 lb overnight or 5 lb in a week.

## 2023-02-27 NOTE — Assessment & Plan Note (Addendum)
Severe COPD. Recent AECOPD following COVID infection. She had clinically improved; now with worsening dyspnea. Surprisingly her lung exam is clear today. Cough has resolved. Would hold off on further abx or steroids. Not much response to SABA currently. Walk test without desaturation on room air today. VS stable. Will trial change to [facility] to see if she has more benefit from use. Provided with samples. Teachback performed. Action plan in place. Check CBC to assess peripheral eosinophilia today.  Suspect dyspnea is more so related to volume overload and persistent sinus symptoms.   Patient Instructions  -Continue Albuterol inhaler 2 puffs or 3 mL neb every 6 hours as needed for shortness of breath or wheezing. Notify if symptoms persist despite rescue inhaler/neb use. Use nebs at least twice a day  -Continue guaifenesin (mucinex) Twice daily for cough/congestion -Continue omeprazole 1 capsule daily  -Continue saline rinses and flonase   -Stop trelegy. Trial Breztri 2 puffs Twice daily. Brush tongue and rinse mouth afterwards. Let me know if this helps and I will send in a new prescription  -Start zytrec, claritin, xyzal, allerga over the counter or store brand version  -Try afrin nasal spray over the counter as needed for congestion but don't use longer than 3-5 days in a row as this can cause worsening sinus symptoms   Labs today  We may adjust your lasix once I get these back    CPAP titration ordered so we can get you restarted on CPAP We discussed how untreated sleep apnea puts an individual at risk for cardiac arrhthymias, pulm HTN, DM, stroke and increases their risk for daytime accidents. We also briefly reviewed treatment options including weight loss, side sleeping position, oral appliance, CPAP therapy or referral to ENT for possible surgical options   Follow up in 6-8 weeks after PFT with Dr. Tonia Brooms or Philis Nettle. If symptoms do not improve or worsen, please contact office for sooner  follow up or seek emergency care.

## 2023-02-27 NOTE — Assessment & Plan Note (Addendum)
Mild improvement with previous empiric augmentin course. Continue supportive care measures. Add on daily antihistamine for allergy triggers. Short term use of nasal decongestant. Can consider CT sinus if symptoms persist. Check IgE today

## 2023-02-28 ENCOUNTER — Encounter (HOSPITAL_COMMUNITY): Payer: Self-pay

## 2023-02-28 ENCOUNTER — Other Ambulatory Visit: Payer: Self-pay

## 2023-02-28 ENCOUNTER — Inpatient Hospital Stay (HOSPITAL_COMMUNITY)
Admission: EM | Admit: 2023-02-28 | Discharge: 2023-03-03 | DRG: 381 | Disposition: A | Discharge status: Home / Self Care | Payer: 59 | Attending: Internal Medicine | Admitting: Internal Medicine

## 2023-02-28 DIAGNOSIS — Z7951 Long term (current) use of inhaled steroids: Secondary | ICD-10-CM

## 2023-02-28 DIAGNOSIS — Z87891 Personal history of nicotine dependence: Secondary | ICD-10-CM

## 2023-02-28 DIAGNOSIS — Z8616 Personal history of COVID-19: Secondary | ICD-10-CM

## 2023-02-28 DIAGNOSIS — D509 Iron deficiency anemia, unspecified: Principal | ICD-10-CM | POA: Diagnosis present

## 2023-02-28 DIAGNOSIS — E66811 Obesity, class 1: Secondary | ICD-10-CM | POA: Diagnosis present

## 2023-02-28 DIAGNOSIS — Z825 Family history of asthma and other chronic lower respiratory diseases: Secondary | ICD-10-CM

## 2023-02-28 DIAGNOSIS — Z8673 Personal history of transient ischemic attack (TIA), and cerebral infarction without residual deficits: Secondary | ICD-10-CM

## 2023-02-28 DIAGNOSIS — E042 Nontoxic multinodular goiter: Secondary | ICD-10-CM | POA: Diagnosis present

## 2023-02-28 DIAGNOSIS — J4489 Other specified chronic obstructive pulmonary disease: Secondary | ICD-10-CM | POA: Diagnosis present

## 2023-02-28 DIAGNOSIS — Z66 Do not resuscitate: Secondary | ICD-10-CM | POA: Diagnosis present

## 2023-02-28 DIAGNOSIS — K315 Obstruction of duodenum: Secondary | ICD-10-CM | POA: Diagnosis present

## 2023-02-28 DIAGNOSIS — K219 Gastro-esophageal reflux disease without esophagitis: Secondary | ICD-10-CM | POA: Diagnosis present

## 2023-02-28 DIAGNOSIS — K2270 Barrett's esophagus without dysplasia: Principal | ICD-10-CM | POA: Diagnosis present

## 2023-02-28 DIAGNOSIS — Z6832 Body mass index (BMI) 32.0-32.9, adult: Secondary | ICD-10-CM

## 2023-02-28 DIAGNOSIS — Z7984 Long term (current) use of oral hypoglycemic drugs: Secondary | ICD-10-CM

## 2023-02-28 DIAGNOSIS — Z8249 Family history of ischemic heart disease and other diseases of the circulatory system: Secondary | ICD-10-CM

## 2023-02-28 DIAGNOSIS — Z5986 Financial insecurity: Secondary | ICD-10-CM

## 2023-02-28 DIAGNOSIS — Z860100 Personal history of colon polyps, unspecified: Secondary | ICD-10-CM

## 2023-02-28 DIAGNOSIS — K3189 Other diseases of stomach and duodenum: Secondary | ICD-10-CM | POA: Diagnosis present

## 2023-02-28 DIAGNOSIS — Z91199 Patient's noncompliance with other medical treatment and regimen due to unspecified reason: Secondary | ICD-10-CM

## 2023-02-28 DIAGNOSIS — Z79890 Hormone replacement therapy (postmenopausal): Secondary | ICD-10-CM

## 2023-02-28 DIAGNOSIS — Z7982 Long term (current) use of aspirin: Secondary | ICD-10-CM

## 2023-02-28 DIAGNOSIS — I2510 Atherosclerotic heart disease of native coronary artery without angina pectoris: Secondary | ICD-10-CM | POA: Diagnosis present

## 2023-02-28 DIAGNOSIS — I10 Essential (primary) hypertension: Secondary | ICD-10-CM | POA: Diagnosis present

## 2023-02-28 DIAGNOSIS — G4733 Obstructive sleep apnea (adult) (pediatric): Secondary | ICD-10-CM | POA: Diagnosis present

## 2023-02-28 DIAGNOSIS — E119 Type 2 diabetes mellitus without complications: Secondary | ICD-10-CM | POA: Diagnosis present

## 2023-02-28 DIAGNOSIS — Z885 Allergy status to narcotic agent status: Secondary | ICD-10-CM

## 2023-02-28 DIAGNOSIS — D649 Anemia, unspecified: Secondary | ICD-10-CM | POA: Diagnosis not present

## 2023-02-28 DIAGNOSIS — Z79818 Long term (current) use of other agents affecting estrogen receptors and estrogen levels: Secondary | ICD-10-CM

## 2023-02-28 DIAGNOSIS — Z79899 Other long term (current) drug therapy: Secondary | ICD-10-CM

## 2023-02-28 DIAGNOSIS — E039 Hypothyroidism, unspecified: Secondary | ICD-10-CM | POA: Diagnosis present

## 2023-02-28 DIAGNOSIS — E785 Hyperlipidemia, unspecified: Secondary | ICD-10-CM | POA: Diagnosis present

## 2023-02-28 DIAGNOSIS — Z888 Allergy status to other drugs, medicaments and biological substances status: Secondary | ICD-10-CM

## 2023-02-28 LAB — CBC WITH DIFFERENTIAL/PLATELET
Abs Immature Granulocytes: 0.01 10*3/uL (ref 0.00–0.07)
Basophils Absolute: 0 10*3/uL (ref 0.0–0.1)
Basophils Relative: 1 %
Eosinophils Absolute: 0.3 10*3/uL (ref 0.0–0.5)
Eosinophils Relative: 6 %
HCT: 24.9 % — ABNORMAL LOW (ref 36.0–46.0)
Hemoglobin: 6.4 g/dL — CL (ref 12.0–15.0)
Immature Granulocytes: 0 %
Lymphocytes Relative: 26 %
Lymphs Abs: 1.3 10*3/uL (ref 0.7–4.0)
MCH: 19.4 pg — ABNORMAL LOW (ref 26.0–34.0)
MCHC: 25.7 g/dL — ABNORMAL LOW (ref 30.0–36.0)
MCV: 75.5 fL — ABNORMAL LOW (ref 80.0–100.0)
Monocytes Absolute: 0.6 10*3/uL (ref 0.1–1.0)
Monocytes Relative: 11 %
Neutro Abs: 2.9 10*3/uL (ref 1.7–7.7)
Neutrophils Relative %: 56 %
Platelets: 263 10*3/uL (ref 150–400)
RBC: 3.3 MIL/uL — ABNORMAL LOW (ref 3.87–5.11)
RDW: 20.4 % — ABNORMAL HIGH (ref 11.5–15.5)
WBC: 5.1 10*3/uL (ref 4.0–10.5)
nRBC: 0 % (ref 0.0–0.2)

## 2023-02-28 LAB — RETICULOCYTES
Immature Retic Fract: 26.7 % — ABNORMAL HIGH (ref 2.3–15.9)
RBC.: 3.33 MIL/uL — ABNORMAL LOW (ref 3.87–5.11)
Retic Count, Absolute: 62.3 10*3/uL (ref 19.0–186.0)
Retic Ct Pct: 1.9 % (ref 0.4–3.1)

## 2023-02-28 LAB — COMPREHENSIVE METABOLIC PANEL
ALT: 13 U/L (ref 0–44)
AST: 19 U/L (ref 15–41)
Albumin: 3.4 g/dL — ABNORMAL LOW (ref 3.5–5.0)
Alkaline Phosphatase: 50 U/L (ref 38–126)
Anion gap: 8 (ref 5–15)
BUN: 13 mg/dL (ref 8–23)
CO2: 22 mmol/L (ref 22–32)
Calcium: 8.5 mg/dL — ABNORMAL LOW (ref 8.9–10.3)
Chloride: 102 mmol/L (ref 98–111)
Creatinine, Ser: 0.74 mg/dL (ref 0.44–1.00)
GFR, Estimated: >60 mL/min (ref >60)
Glucose, Bld: 119 mg/dL — ABNORMAL HIGH (ref 70–99)
Potassium: 3.7 mmol/L (ref 3.5–5.1)
Sodium: 132 mmol/L — ABNORMAL LOW (ref 135–145)
Total Bilirubin: 0.3 mg/dL (ref <1.2)
Total Protein: 6.5 g/dL (ref 6.5–8.1)

## 2023-02-28 LAB — IRON AND TIBC
Iron: 15 ug/dL — ABNORMAL LOW (ref 28–170)
Saturation Ratios: 4 % — ABNORMAL LOW (ref 10.4–31.8)
TIBC: 391 ug/dL (ref 250–450)
UIBC: 376 ug/dL

## 2023-02-28 LAB — PREPARE RBC (CROSSMATCH)

## 2023-02-28 LAB — HEMOGLOBIN A1C
Hgb A1c MFr Bld: 6.9 % — ABNORMAL HIGH (ref 4.8–5.6)
Mean Plasma Glucose: 151.33 mg/dL

## 2023-02-28 LAB — GLUCOSE, CAPILLARY
Glucose-Capillary: 78 mg/dL (ref 70–99)
Glucose-Capillary: 103 mg/dL — ABNORMAL HIGH (ref 70–99)

## 2023-02-28 LAB — ABO/RH: ABO/RH(D): O POS

## 2023-02-28 LAB — OCCULT BLOOD X 1 CARD TO LAB, STOOL: Fecal Occult Bld: NEGATIVE

## 2023-02-28 LAB — FOLATE: Folate: 22.3 ng/mL (ref >5.9)

## 2023-02-28 LAB — FERRITIN: Ferritin: 3 ng/mL — ABNORMAL LOW (ref 11–307)

## 2023-02-28 LAB — VITAMIN B12: Vitamin B-12: 569 pg/mL (ref 180–914)

## 2023-02-28 MED ORDER — SODIUM CHLORIDE 0.9% FLUSH
3.0000 mL | Freq: Two times a day (BID) | INTRAVENOUS | Status: DC
  Administered 2023-02-28 – 2023-03-03 (×6): 3 mL via INTRAVENOUS

## 2023-02-28 MED ORDER — ACETAMINOPHEN 650 MG RE SUPP: 650.0000 mg | Freq: Four times a day (QID) | RECTAL | Status: DC | PRN

## 2023-02-28 MED ORDER — LEVOTHYROXINE SODIUM 100 MCG PO TABS
200.0000 ug | ORAL_TABLET | Freq: Every day | ORAL | Status: DC
  Administered 2023-03-01 – 2023-03-03 (×2): 200 ug via ORAL
  Filled 2023-02-28 (×2): qty 2

## 2023-02-28 MED ORDER — ALBUTEROL SULFATE (2.5 MG/3ML) 0.083% IN NEBU: 2.5000 mg | INHALATION_SOLUTION | RESPIRATORY_TRACT | Status: DC | PRN

## 2023-02-28 MED ORDER — MOMETASONE FURO-FORMOTEROL FUM 100-5 MCG/ACT IN AERO
2.0000 | INHALATION_SPRAY | Freq: Two times a day (BID) | RESPIRATORY_TRACT | Status: DC
  Administered 2023-02-28 – 2023-03-03 (×6): 2 via RESPIRATORY_TRACT
  Filled 2023-02-28: qty 8.8

## 2023-02-28 MED ORDER — INSULIN ASPART 100 UNIT/ML IJ SOLN
0.0000 [IU] | Freq: Three times a day (TID) | INTRAMUSCULAR | Status: DC
  Filled 2023-02-28: qty 0.06

## 2023-02-28 MED ORDER — FLUTICASONE PROPIONATE 50 MCG/ACT NA SUSP
2.0000 | Freq: Every day | NASAL | Status: DC
  Administered 2023-03-01 – 2023-03-03 (×3): 2 via NASAL
  Filled 2023-02-28: qty 16

## 2023-02-28 MED ORDER — UMECLIDINIUM BROMIDE 62.5 MCG/ACT IN AEPB
1.0000 | INHALATION_SPRAY | Freq: Every day | RESPIRATORY_TRACT | Status: DC
  Administered 2023-03-01 – 2023-03-03 (×3): 1 via RESPIRATORY_TRACT
  Filled 2023-02-28: qty 7

## 2023-02-28 MED ORDER — ACETAMINOPHEN 325 MG PO TABS
650.0000 mg | ORAL_TABLET | Freq: Four times a day (QID) | ORAL | Status: DC | PRN
Start: 2023-02-28 — End: 2023-03-03
  Administered 2023-03-01: 650 mg via ORAL
  Filled 2023-02-28 (×2): qty 2

## 2023-02-28 MED ORDER — BUDESON-GLYCOPYRROL-FORMOTEROL 160-9-4.8 MCG/ACT IN AERO: 2.0000 | INHALATION_SPRAY | Freq: Two times a day (BID) | RESPIRATORY_TRACT | Status: DC

## 2023-02-28 MED ORDER — ONDANSETRON HCL 4 MG/2ML IJ SOLN: 4.0000 mg | Freq: Four times a day (QID) | INTRAMUSCULAR | Status: DC | PRN

## 2023-02-28 MED ORDER — ISOSORBIDE MONONITRATE ER 60 MG PO TB24
60.0000 mg | ORAL_TABLET | Freq: Every day | ORAL | Status: DC
  Administered 2023-03-01 – 2023-03-03 (×3): 60 mg via ORAL
  Filled 2023-02-28 (×3): qty 1

## 2023-02-28 MED ORDER — BISOPROLOL FUMARATE 5 MG PO TABS
10.0000 mg | ORAL_TABLET | Freq: Every day | ORAL | Status: DC
  Administered 2023-03-01 – 2023-03-03 (×3): 10 mg via ORAL
  Filled 2023-02-28 (×3): qty 2

## 2023-02-28 MED ORDER — SODIUM CHLORIDE 0.9% IV SOLUTION: Freq: Once | INTRAVENOUS | Status: DC

## 2023-02-28 MED ORDER — GUAIFENESIN ER 600 MG PO TB12
600.0000 mg | ORAL_TABLET | Freq: Two times a day (BID) | ORAL | Status: DC | PRN
  Administered 2023-03-01: 600 mg via ORAL
  Filled 2023-02-28 (×2): qty 1

## 2023-02-28 MED ORDER — ONDANSETRON HCL 4 MG PO TABS: 4.0000 mg | ORAL_TABLET | Freq: Four times a day (QID) | ORAL | Status: DC | PRN

## 2023-02-28 MED ORDER — BISACODYL 5 MG PO TBEC
5.0000 mg | DELAYED_RELEASE_TABLET | Freq: Every day | ORAL | Status: DC | PRN
  Administered 2023-03-02: 5 mg via ORAL

## 2023-02-28 MED ORDER — SODIUM CHLORIDE 0.9% FLUSH: 3.0000 mL | INTRAVENOUS | Status: DC | PRN

## 2023-02-28 MED ORDER — EZETIMIBE 10 MG PO TABS
10.0000 mg | ORAL_TABLET | Freq: Every day | ORAL | Status: DC
  Administered 2023-03-01 – 2023-03-03 (×3): 10 mg via ORAL
  Filled 2023-02-28 (×3): qty 1

## 2023-02-28 MED ORDER — PREGABALIN 50 MG PO CAPS
50.0000 mg | ORAL_CAPSULE | Freq: Every day | ORAL | Status: DC
  Administered 2023-02-28 – 2023-03-03 (×4): 50 mg via ORAL
  Filled 2023-02-28 (×4): qty 1

## 2023-02-28 MED ORDER — HYDROCODONE-ACETAMINOPHEN 5-325 MG PO TABS
1.0000 | ORAL_TABLET | ORAL | Status: DC | PRN
Start: 2023-02-28 — End: 2023-03-03
  Administered 2023-03-01: 1 via ORAL
  Administered 2023-03-01: 2 via ORAL
  Administered 2023-03-01: 1 via ORAL
  Administered 2023-03-02 – 2023-03-03 (×2): 2 via ORAL
  Filled 2023-02-28 (×2): qty 2
  Filled 2023-02-28 (×2): qty 1
  Filled 2023-02-28: qty 2

## 2023-02-28 MED ORDER — VALACYCLOVIR HCL 500 MG PO TABS
500.0000 mg | ORAL_TABLET | Freq: Every day | ORAL | Status: DC
  Administered 2023-03-01 – 2023-03-03 (×3): 500 mg via ORAL
  Filled 2023-02-28 (×3): qty 1

## 2023-02-28 MED ORDER — ORAL CARE MOUTH RINSE: 15.0000 mL | OROMUCOSAL | Status: DC | PRN

## 2023-02-28 MED ORDER — IPRATROPIUM-ALBUTEROL 0.5-2.5 (3) MG/3ML IN SOLN: 3.0000 mL | Freq: Four times a day (QID) | RESPIRATORY_TRACT | Status: DC | PRN

## 2023-02-28 MED ORDER — SODIUM CHLORIDE 0.9 % IV SOLN: 250.0000 mL | INTRAVENOUS | Status: AC | PRN

## 2023-02-28 MED ORDER — PANTOPRAZOLE SODIUM 40 MG IV SOLR
40.0000 mg | Freq: Two times a day (BID) | INTRAVENOUS | Status: DC
  Administered 2023-02-28 – 2023-03-03 (×7): 40 mg via INTRAVENOUS
  Filled 2023-02-28 (×7): qty 10

## 2023-02-28 NOTE — ED Notes (Signed)
ED TO INPATIENT HANDOFF REPORT  ED Nurse Name and Phone #:   S Name/Age/Gender Grace [name] 67 y.o. female Room/Bed: WA04/WA04  Code Status   Code Status: Limited: Do not attempt resuscitation (DNR) -DNR-LIMITED -Do Not Intubate/DNI   Home/SNF/Other Home Patient oriented to: self, place, time, and situation Is this baseline? Yes   Triage Complete: Triage complete  Chief Complaint Severe anemia [D64.9]  Triage Note Pt presents d/t Hgb 7.1.  Pt reports increased dyspnea w/ exertion and fatigue.  Hx of COPD.  Pt denies abnormal bleeding.    Allergies Allergies  Allergen Reactions   Amlodipine     Cramps    Codeine     REACTION: headaches   Gabapentin     headache   Prozac [Fluoxetine Hcl] Other (See Comments)    Suicidal ideations    Level of Care/Admitting Diagnosis ED Disposition     ED Disposition  Admit   Condition  --   Comment  Hospital Area: [facility] [[id number]]  Level of Care: Telemetry [5]  Admit to tele based on following criteria: Monitor QTC interval  May place patient in observation at [facility] or [facility] if equivalent level of care is available:: No  Covid Evaluation: Asymptomatic - no recent exposure (last 10 days) testing not required  Diagnosis: Severe anemia [1610960]  Admitting Physician: Alba Cory [phone]  Attending Physician: Velora Mediate          B Medical/Surgery History Past Medical History:  Diagnosis Date   Abnormal vaginal bleeding    Anxiety    Asthma    Barrett's esophagus    with high grade dysplasia per endoscopy 05/2005 // followed by Dr. Stan Head ([facility])   Chronic back pain    COPD (chronic obstructive pulmonary disease) (HCC)    Diabetes mellitus without complication (HCC)    type 2   Duodenal ulcer disease    thought to be contributed at least partly by overuse of NSAIDs and salicylates (goody powdr)   Endometrial polyp 03/2003   s/p resection in 03/2003.  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   Migraine    Nevus    Personal history of failed conscious sedation 08/11/2011   Sleep apnea    does not use cpap   Stroke (HCC) 10/2000   Tachycardia    Thyroid disease    Tobacco abuse Jan 2014   quit   Past Surgical History:  Procedure Laterality Date   ADENOIDECTOMY     APPENDECTOMY  2017   BALLOON DILATION N/A 06/14/2020   Procedure: BALLOON DILATION;  Surgeon: Meridee Score Netty Starring., MD;  Location: [facility] ENDOSCOPY;  Service: Gastroenterology;  Laterality: N/A;   BIOPSY  02/15/2020   Procedure: BIOPSY;  Surgeon: Willis Modena, MD;  Location: [facility] ENDOSCOPY;  Service: Endoscopy;;   BIOPSY  06/14/2020   Procedure: BIOPSY;  Surgeon: Lemar Lofty., MD;  Location: [facility] ENDOSCOPY;  Service: Gastroenterology;;   BRONCHIAL BIOPSY  03/15/2020   Procedure: BRONCHIAL BIOPSIES;  Surgeon: Josephine Igo, DO;  Location: [facility] ENDOSCOPY;  Service: Pulmonary;;   BRONCHIAL BRUSHINGS  03/15/2020   Procedure: BRONCHIAL BRUSHINGS;  Surgeon: Josephine Igo, DO;  Location: [facility] ENDOSCOPY;  Service: Pulmonary;;   BRONCHIAL NEEDLE ASPIRATION BIOPSY  03/15/2020   Procedure: BRONCHIAL NEEDLE ASPIRATION BIOPSIES;  Surgeon: Josephine Igo, DO;  Location: [facility] ENDOSCOPY;  Service:  Pulmonary;;   BRONCHIAL WASHINGS  03/15/2020   Procedure: BRONCHIAL WASHINGS;  Surgeon: Josephine Igo, DO;  Location: [facility] ENDOSCOPY;  Service: Pulmonary;;   CHOLECYSTECTOMY     COLONOSCOPY  02/07/2010   COLONOSCOPY WITH PROPOFOL N/A 02/15/2020   Procedure: COLONOSCOPY WITH PROPOFOL;  Surgeon: Willis Modena, MD;  Location: [facility] ENDOSCOPY;  Service: Endoscopy;  Laterality: N/A;   DILATION AND CURETTAGE OF UTERUS     ENDOSCOPIC MUCOSAL RESECTION N/A 06/14/2020   Procedure: ENDOSCOPIC MUCOSAL RESECTION;  Surgeon: Meridee Score Netty Starring., MD;  Location: [facility] ENDOSCOPY;  Service: Gastroenterology;  Laterality: N/A;   ESOPHAGOGASTRODUODENOSCOPY (EGD) WITH PROPOFOL N/A 02/15/2020   Procedure: ESOPHAGOGASTRODUODENOSCOPY (EGD) WITH PROPOFOL;  Surgeon: Willis Modena, MD;  Location: [facility] ENDOSCOPY;  Service: Endoscopy;  Laterality: N/A;   ESOPHAGOGASTRODUODENOSCOPY (EGD) WITH PROPOFOL N/A 06/14/2020   Procedure: ESOPHAGOGASTRODUODENOSCOPY (EGD) WITH PROPOFOL;  Surgeon: Meridee Score Netty Starring., MD;  Location: [facility] ENDOSCOPY;  Service: Gastroenterology;  Laterality: N/A;   FOREIGN BODY REMOVAL  06/14/2020   Procedure: FOREIGN BODY REMOVAL;  Surgeon: Meridee Score, Netty Starring., MD;  Location: [facility] ENDOSCOPY;  Service: Gastroenterology;;   HYSTEROSCOPY     LAPAROSCOPIC APPENDECTOMY N/A 09/23/2015   Procedure: APPENDECTOMY LAPAROSCOPIC;  Surgeon: Violeta Gelinas, MD;  Location: [facility] OR;  Service: General;  Laterality: N/A;   MYOMECTOMY  1990, 1997   x 2 - In 1997, noted to have extensive pelvic adhesions and BL tubal obstruction   POLYPECTOMY  02/15/2020   Procedure: POLYPECTOMY;  Surgeon: Willis Modena, MD;  Location: [facility] ENDOSCOPY;  Service: Endoscopy;;   TONSILLECTOMY     TRANSTHORACIC ECHOCARDIOGRAM  06/2014    EF 55-60%.  No RWMA. Gr 1 DD. Cannot R/o PFO (consider bubble study).   UPPER ESOPHAGEAL ENDOSCOPIC ULTRASOUND (EUS) N/A 06/14/2020   Procedure: UPPER ESOPHAGEAL ENDOSCOPIC ULTRASOUND (EUS);  Surgeon: Lemar Lofty., MD;  Location: [facility] ENDOSCOPY;  Service: Gastroenterology;  Laterality: N/A;   UPPER GASTROINTESTINAL ENDOSCOPY  12/18/2009   VIDEO BRONCHOSCOPY WITH ENDOBRONCHIAL NAVIGATION N/A 03/15/2020   Procedure: VIDEO BRONCHOSCOPY WITH ENDOBRONCHIAL NAVIGATION;  Surgeon: Josephine Igo, DO;  Location: [facility] ENDOSCOPY;  Service: Pulmonary;  Laterality: N/A;   VIDEO BRONCHOSCOPY WITH ENDOBRONCHIAL ULTRASOUND  03/15/2020   Procedure: VIDEO BRONCHOSCOPY WITH ENDOBRONCHIAL ULTRASOUND;  Surgeon: Josephine Igo, DO;  Location: [facility] ENDOSCOPY;  Service: Pulmonary;;   WISDOM TOOTH EXTRACTION       A IV Location/Drains/Wounds Patient Lines/Drains/Airways Status     Active Line/Drains/Airways     Name Placement date Placement time Site Days   Peripheral IV 02/28/23 20 G 1" Anterior;Left Forearm 02/28/23  1030  Forearm  less than 1   Incision - 3 Ports Abdomen 1: Umbilicus 2: Right 3: Left 09/23/15  1617  -- 2715            Intake/Output  Last 24 hours No intake or output data in the 24 hours ending 02/28/23 1405  Labs/Imaging Results for orders placed or performed during the hospital encounter of 02/28/23 (from the past 48 hour(s))  CBC with Differential     Status: Abnormal   Collection Time: 02/28/23 10:33 AM  Result Value Ref Range   WBC 5.1 4.0 - 10.5 K/uL   RBC 3.30 (L) 3.87 - 5.11 MIL/uL   Hemoglobin 6.4 (LL) 12.0 - 15.0 g/dL    Comment: REPEATED TO VERIFY Reticulocyte Hemoglobin testing may be clinically indicated, consider ordering this additional test [id number] THIS CRITICAL RESULT HAS VERIFIED AND BEEN CALLED TO BRUNSON,T. RN BY NICOLE MCCOY ON 11 16 2024 AT 1112, AND HAS BEEN READ BACK.     HCT 24.9 (L) 36.0 - 46.0 %   MCV 75.5 (L) 80.0 - 100.0 fL   MCH 19.4 (L) 26.0 - 34.0 pg   MCHC 25.7 (L) 30.0 - 36.0 g/dL   RDW 59.5 (H) 63.8 - 75.6 %   Platelets 263 150 - 400 K/uL   nRBC 0.0 0.0 - 0.2 %   Neutrophils Relative % 56 %   Neutro Abs 2.9 1.7 - 7.7 K/uL   Lymphocytes  Relative 26 %   Lymphs Abs 1.3 0.7 - 4.0 K/uL   Monocytes Relative 11 %   Monocytes Absolute 0.6 0.1 - 1.0 K/uL   Eosinophils Relative 6 %   Eosinophils Absolute 0.3 0.0 - 0.5 K/uL   Basophils Relative 1 %   Basophils Absolute 0.0 0.0 - 0.1 K/uL   Immature Granulocytes 0 %   Abs Immature Granulocytes 0.01 0.00 - 0.07 K/uL    Comment: Performed at [facility], [address], [place], [place] [address]  Type and screen [facility]     Status: None (Preliminary result)   Collection Time: 02/28/23 10:33 AM  Result Value Ref Range   ABO/RH(D) O POS    Antibody Screen NEG    Sample Expiration      03/03/2023,2359 Performed at [facility], [address], [place], [place] [address]    Unit Number [id number]    Blood Component Type RED CELLS,LR    Unit division 00    Status of Unit ALLOCATED    Transfusion Status OK TO TRANSFUSE    Crossmatch Result Compatible    Unit Number [id number]    Blood Component Type RED CELLS,LR    Unit division 00    Status of Unit ALLOCATED    Transfusion Status OK TO TRANSFUSE    Crossmatch Result Compatible   Comprehensive metabolic panel     Status: Abnormal   Collection Time: 02/28/23 10:33 AM  Result Value Ref Range   Sodium 132 (L) 135 - 145 mmol/L   Potassium 3.7 3.5 - 5.1 mmol/L   Chloride 102 98 - 111 mmol/L   CO2 22 22 - 32 mmol/L   Glucose, Bld 119 (H) 70 - 99 mg/dL    Comment: Glucose reference range applies only to samples taken after fasting for at least 8 hours.   BUN 13 8 - 23 mg/dL   Creatinine, Ser 2.70 0.44 - 1.00 mg/dL   Calcium 8.5 (L) 8.9 - 10.3 mg/dL   Total Protein 6.5 6.5 - 8.1 g/dL   Albumin 3.4 (L) 3.5 - 5.0 g/dL   AST 19 15 - 41 U/L   ALT 13 0 - 44 U/L   Alkaline Phosphatase 50 38 - 126 U/L  Total Bilirubin 0.3 <1.2 mg/dL   GFR, Estimated >96 >04 mL/min    Comment: (NOTE) Calculated using the CKD-EPI Creatinine Equation (2021)    Anion gap 8 5 - 15     Comment: Performed at [facility], [address] [place], [place] [address]  Prepare RBC     Status: None   Collection Time: 02/28/23 10:33 AM  Result Value Ref Range   Order Confirmation      ORDER PROCESSED BY BLOOD BANK Performed at [facility], [address] [place], [place] [address]   Reticulocytes     Status: Abnormal   Collection Time: 02/28/23 10:33 AM  Result Value Ref Range   Retic Ct Pct 1.9 0.4 - 3.1 %   RBC. 3.33 (L) 3.87 - 5.11 MIL/uL   Retic Count, Absolute 62.3 19.0 - 186.0 K/uL   Immature Retic Fract 26.7 (H) 2.3 - 15.9 %    Comment: Performed at [facility], [address], [place], [place] [address]  Occult blood card to lab, stool Provider will collect     Status: None   Collection Time: 02/28/23 11:46 AM  Result Value Ref Range   Fecal Occult Bld NEGATIVE NEGATIVE    Comment: Performed at [facility], [address] [place], [place] [address]  ABO/Rh     Status: None   Collection Time: 02/28/23 11:53 AM  Result Value Ref Range   ABO/RH(D)      O POS Performed at [facility], [address], [place], [place] [address]    No results found.  Pending Labs Unresulted Labs (From admission, onward)     Start     Ordered   02/28/23 1320  Hemoglobin A1c  Once,   R       Comments: To assess prior glycemic control    02/28/23 1320   02/28/23 1202  Vitamin B12  (Anemia Panel (PNL))  Once,   URGENT        02/28/23 1201   02/28/23 1202  Folate  (Anemia Panel (PNL))  Once,   URGENT        02/28/23 1201   02/28/23 1202  Iron and TIBC  (Anemia Panel (PNL))  Once,   URGENT        02/28/23 1201   02/28/23 1202  Ferritin  (Anemia Panel (PNL))  Once,   URGENT        02/28/23 1201   Signed and Held  HIV Antibody (routine testing w rflx)  (HIV Antibody (Routine testing w reflex) panel)  Once,   R        Signed and Held   Signed and Held  Basic metabolic panel   Tomorrow morning,   R        Signed and Held   Signed and Held  CBC  Tomorrow morning,   R        Signed and Held            Vitals/Pain Today's Vitals   02/28/23 0950 02/28/23 0951 02/28/23 0956 02/28/23 1307  BP: 138/69 138/69  138/72  Pulse: 93 95  72  Resp: 18   18  Temp: 98.4 F (36.9 C)   97.9 F (36.6 C)  TempSrc: Oral   Oral  SpO2: 96% 96%  98%  Weight:   82.6 kg   Height:   5\' 3"  (1.6 m)   PainSc:   0-No pain  Isolation Precautions No active isolations  Medications Medications  0.9 %  sodium chloride infusion (Manually program via Guardrails IV Fluids) (0 mLs Intravenous Hold 02/28/23 1215)  pantoprazole (PROTONIX) injection 40 mg (has no administration in time range)  bisoprolol (ZEBETA) tablet 10 mg (has no administration in time range)  Budeson-Glycopyrrol-Formoterol 160-9-4.8 MCG/ACT AERO 2 puff (has no administration in time range)  ezetimibe (ZETIA) tablet 10 mg (has no administration in time range)  fluticasone (FLONASE) 50 MCG/ACT nasal spray 2 spray (has no administration in time range)  guaiFENesin (MUCINEX) 12 hr tablet 600 mg (has no administration in time range)  ipratropium-albuterol (DUONEB) 0.5-2.5 (3) MG/3ML nebulizer solution 3 mL (has no administration in time range)  levothyroxine (SYNTHROID) tablet 200 mcg (has no administration in time range)  pregabalin (LYRICA) capsule 50 mg (has no administration in time range)  valACYclovir (VALTREX) tablet 500 mg (has no administration in time range)  isosorbide mononitrate (IMDUR) 24 hr tablet 60 mg (has no administration in time range)  insulin aspart (novoLOG) injection 0-6 Units (has no administration in time range)    Mobility walks     Focused Assessments   R Recommendations: See Admitting Provider Note  Report given to:   Additional Notes:

## 2023-02-28 NOTE — ED Notes (Signed)
All charting from 1100 02/28/2023 done until further notice or charter otherwise has been completed by Vernie Ammons RN

## 2023-02-28 NOTE — Plan of Care (Signed)

## 2023-02-28 NOTE — ED Provider Notes (Signed)
I provided a substantive portion of the care of this patient.  I personally made/approved the management plan for this patient and take responsibility for the patient management.  EKG Interpretation Date/Time:  Saturday February 28 2023 10:25:54 EST Ventricular Rate:  80 PR Interval:  178 QRS Duration:  87 QT Interval:  376 QTC Calculation: 434 R Axis:   51  Text Interpretation: Sinus rhythm Atrial premature complex Confirmed by Lorre Nick ([id number]) on 02/28/2023 11:29:58 AM   Patient is EKG per interpretation shows normal sinus rhythm.  Patient here due to increased dyspnea and fatigue.  Hemoglobin.  Noted to be 6.4.  Was 7.1 a few days ago.  Unclear etiology.  Will transfuse and admit   Lorre Nick, MD 02/28/23 1141

## 2023-02-28 NOTE — ED Provider Notes (Signed)
[place] EMERGENCY DEPARTMENT AT [facility] Provider Note   CSN: [id number] Arrival date & time: 02/28/23  5784     History  Chief Complaint  Patient presents with   Abnormal Lab    Grace [name] is a 67 y.o. female.  Patient with history of COPD, former smoker, "pre-diabetes", HTN, HLD, OSA not on CPAP, presents on the advice of her pulmonary doctor for low hemoglobin. She was seen 11/14 in the office as a follow up to COVID infection in late August, 1st follow up 9/24 when started on Augmentin and prednisone taper. Doing well from a pulmonary aspect. She reported excessive fatigue and sleepiness, with some positional dizziness progressively worsening over the last 2 weeks so labs were obtained. No history of anemia or previous transfusions. No melena, vomiting. No syncope or near syncope. She was contacted today and advised to come to ED for transfusion.   The history is provided by the patient. No language interpreter was used.  Abnormal Lab      Home Medications Prior to Admission medications   Medication Sig Start Date End Date Taking? Authorizing Provider  albuterol (PROAIR HFA) 108 (90 Base) MCG/ACT inhaler INHALE 2 PUFFS EVERY 6  HOURS AS NEEDED FOR  SHORTNESS OF BREATH OR  WHEEZING Patient taking differently: Inhale 2 puffs into the lungs every 6 (six) hours as needed for wheezing. 05/17/19   Grace Seller, [name]  AMBULATORY NON FORMULARY MEDICATION Medication Name: GI cocktail  90 ML viscous lidocaine  90 ML 10 mg/5 ml Dicyclomine  270 ML Maalox  Swish and swallow 30 ML four times daily for 3 days 06/14/20   Grace [name]  Aspirin-Acetaminophen (GOODYS BODY PAIN PO) Take 1 Package by mouth daily as needed (pain/headache).    Provider, Historical, [name]  bisoprolol (ZEBETA) 10 MG tablet Take 10 mg by mouth daily. 04/19/20   Provider, Historical, [name]  Budeson-Glycopyrrol-Formoterol (BREZTRI AEROSPHERE) 160-9-4.8 MCG/ACT AERO Inhale 2 puffs into the  lungs in the morning and at bedtime. 02/26/23   Cobb, Grace [name]  calcium carbonate (TUMS - DOSED IN MG ELEMENTAL CALCIUM) 500 MG chewable tablet Chew 1 tablet by mouth daily as needed for indigestion or heartburn.    Provider, Historical, [name]  COLLAGEN-VITAMIN C PO Take 3 tablets by mouth in the morning and at bedtime.    Provider, Historical, [name]  estradiol (ESTRACE VAGINAL) 0.1 MG/GM vaginal cream Place 1 Applicatorful vaginally at bedtime. Patient taking differently: Place 1 Applicatorful vaginally 2 (two) times a week. 08/19/19   [name], [name]  ezetimibe (ZETIA) 10 MG tablet Take 10 mg by mouth daily. 09/29/22   Provider, Historical, [name]  fluticasone (FLONASE) 50 MCG/ACT nasal spray Place 2 sprays into both nostrils daily. 01/09/23   Cobb, Grace [name]  furosemide (LASIX) 40 MG tablet Take 1 tab daily as needed for weight gain of 3 lb over night or 5 lb in 1 week. 12/31/22   Grace [name]  glipiZIDE (GLUCOTROL XL) 5 MG 24 hr tablet TAKE 1 TABLET BY MOUTH  DAILY WITH BREAKFAST Patient taking differently: Take 5 mg by mouth every evening. 09/05/19   Grace [name]  glucose blood (ONETOUCH VERIO) test strip Use to check blood sugar twice daily. Dx: E11.9 08/25/18   Grace [name]  guaiFENesin (MUCINEX) 600 MG 12 hr tablet Take 600 mg by mouth 2 (two) times daily as needed for to loosen phlegm or cough.    Provider,  Historical, [name]  hydrALAZINE (APRESOLINE) 50 MG tablet TAKE 1 TABLET BY MOUTH 3  TIMES DAILY Patient taking differently: Take 50 mg by mouth 3 (three) times daily. 07/25/19   Grace [name]  ibuprofen (ADVIL) 200 MG tablet Take 800 mg by mouth every 8 (eight) hours as needed for moderate pain.    Provider, Historical, [name]  ipratropium-albuterol (DUONEB) 0.5-2.5 (3) MG/3ML SOLN Take 3 mLs by nebulization every 6 (six) hours as needed. 02/07/19   Luciano Cutter, [name]  isosorbide mononitrate (IMDUR) 30 MG 24 hr tablet Take 2 tablets (60 mg total) by mouth  daily. 01/15/23   Grace [name]  Lancets (ONETOUCH DELICA PLUS LANCET33G) MISC USE TO CHECK BLOOD SUGAR  TWICE DAILY 04/01/19   Grace [name]  levothyroxine (SYNTHROID) 200 MCG tablet Take 200 mcg by mouth daily before breakfast.    Provider, Historical, [name]  losartan (COZAAR) 100 MG tablet TAKE 1 TABLET BY MOUTH  DAILY Patient taking differently: Take 100 mg by mouth every evening. 07/20/19   Grace [name]  omeprazole (PRILOSEC) 40 MG capsule Take 1 capsule (40 mg total) by mouth 2 (two) times daily before a meal. Take 30- 60 min before your first and last meals of the day 06/14/20   Grace [name]  predniSONE (DELTASONE) 10 MG tablet 4 tabs for 3 days, then 3 tabs for 3 days, 2 tabs for 3 days, then 1 tab for 3 days, then stop 01/09/23   Cobb, Grace [name]  pregabalin (LYRICA) 50 MG capsule Take 50 mg by mouth daily. 10/13/22   Provider, Historical, [name]  REPATHA SURECLICK 140 MG/ML SOAJ INJECT 1 PEN SUBCUTANEOUSLY  EVERY 2 WEEKS 02/20/23   Grace [name]  Respiratory Therapy Supplies (NEBULIZER/TUBING/MOUTHPIECE) KIT 1 application by Does not apply route every 6 (six) hours as needed. FAX TO [phone] 07/19/19   Grace [name]  TRELEGY ELLIPTA 200-62.5-25 MCG/ACT AEPB Inhale 1 puff into the lungs daily. 12/11/22   Provider, Historical, [name]  valACYclovir (VALTREX) 500 MG tablet TAKE 1 TABLET BY MOUTH  DAILY Patient taking differently: Take 500 mg by mouth daily. 11/23/19   Grace [name]  ipratropium (ATROVENT HFA) 17 MCG/ACT inhaler Inhale 2 puffs into the lungs 4 (four) times daily. 04/25/11 06/30/11  Danley Danker, [name]      Allergies    Amlodipine, Codeine, Gabapentin, and Prozac [fluoxetine hcl]    Review of Systems   Review of Systems  Physical Exam Updated Vital Signs BP 138/69   Pulse 95   Temp 98.4 F (36.9 C) (Oral)   Resp 18   Ht 5\' 3"  (1.6 m)   Wt 82.6 kg   LMP 02/27/2014   SpO2 96%   BMI 32.24 kg/m  Physical  Exam Vitals and nursing note reviewed.  Constitutional:      Appearance: Normal appearance.  Eyes:     Comments: Moderately pale conjunctiva.   Cardiovascular:     Rate and Rhythm: Normal rate and regular rhythm.     Heart sounds: No murmur heard. Pulmonary:     Effort: Pulmonary effort is normal.     Breath sounds: Rhonchi (Minimal rhonchi bilaterally.) present. No wheezing or rales.  Abdominal:     General: There is no distension.  Musculoskeletal:        General: Normal range of motion.     Cervical back: Normal range of motion.  Skin:    General: Skin is  warm and dry.     Capillary Refill: Capillary refill takes less than 2 seconds.     Coloration: Skin is not pale.  Neurological:     Mental Status: She is alert and oriented to person, place, and time.     ED Results / Procedures / Treatments   Labs (all labs ordered are listed, but only abnormal results are displayed) Labs Reviewed  CBC WITH DIFFERENTIAL/PLATELET - Abnormal; Notable for the following components:      Result Value   RBC 3.30 (*)    Hemoglobin 6.4 (*)    HCT 24.9 (*)    MCV 75.5 (*)    MCH 19.4 (*)    MCHC 25.7 (*)    RDW 20.4 (*)    All other components within normal limits  COMPREHENSIVE METABOLIC PANEL - Abnormal; Notable for the following components:   Sodium 132 (*)    Glucose, Bld 119 (*)    Calcium 8.5 (*)    Albumin 3.4 (*)    All other components within normal limits  RETICULOCYTES - Abnormal; Notable for the following components:   RBC. 3.33 (*)    Immature Retic Fract 26.7 (*)    All other components within normal limits  OCCULT BLOOD X 1 CARD TO LAB, STOOL  VITAMIN B12  FOLATE  IRON AND TIBC  FERRITIN  TYPE AND SCREEN  PREPARE RBC (CROSSMATCH)  ABO/RH   Results for orders placed or performed during the hospital encounter of 02/28/23  CBC with Differential  Result Value Ref Range   WBC 5.1 4.0 - 10.5 K/uL   RBC 3.30 (L) 3.87 - 5.11 MIL/uL   Hemoglobin 6.4 (LL) 12.0 - 15.0  g/dL   HCT 86.5 (L) 78.4 - 69.6 %   MCV 75.5 (L) 80.0 - 100.0 fL   MCH 19.4 (L) 26.0 - 34.0 pg   MCHC 25.7 (L) 30.0 - 36.0 g/dL   RDW 29.5 (H) 28.4 - 13.2 %   Platelets 263 150 - 400 K/uL   nRBC 0.0 0.0 - 0.2 %   Neutrophils Relative % 56 %   Neutro Abs 2.9 1.7 - 7.7 K/uL   Lymphocytes Relative 26 %   Lymphs Abs 1.3 0.7 - 4.0 K/uL   Monocytes Relative 11 %   Monocytes Absolute 0.6 0.1 - 1.0 K/uL   Eosinophils Relative 6 %   Eosinophils Absolute 0.3 0.0 - 0.5 K/uL   Basophils Relative 1 %   Basophils Absolute 0.0 0.0 - 0.1 K/uL   Immature Granulocytes 0 %   Abs Immature Granulocytes 0.01 0.00 - 0.07 K/uL  Comprehensive metabolic panel  Result Value Ref Range   Sodium 132 (L) 135 - 145 mmol/L   Potassium 3.7 3.5 - 5.1 mmol/L   Chloride 102 98 - 111 mmol/L   CO2 22 22 - 32 mmol/L   Glucose, Bld 119 (H) 70 - 99 mg/dL   BUN 13 8 - 23 mg/dL   Creatinine, Ser 4.40 0.44 - 1.00 mg/dL   Calcium 8.5 (L) 8.9 - 10.3 mg/dL   Total Protein 6.5 6.5 - 8.1 g/dL   Albumin 3.4 (L) 3.5 - 5.0 g/dL   AST 19 15 - 41 U/L   ALT 13 0 - 44 U/L   Alkaline Phosphatase 50 38 - 126 U/L   Total Bilirubin 0.3 <1.2 mg/dL   GFR, Estimated >10 >27 mL/min   Anion gap 8 5 - 15  Occult blood card to lab, stool Provider will collect  Result  Value Ref Range   Fecal Occult Bld NEGATIVE NEGATIVE  Reticulocytes  Result Value Ref Range   Retic Ct Pct 1.9 0.4 - 3.1 %   RBC. 3.33 (L) 3.87 - 5.11 MIL/uL   Retic Count, Absolute 62.3 19.0 - 186.0 K/uL   Immature Retic Fract 26.7 (H) 2.3 - 15.9 %  Type and screen [facility]  Result Value Ref Range   ABO/RH(D) O POS    Antibody Screen NEG    Sample Expiration      03/03/2023,2359 Performed at [facility], [address] [place], [place] [address]     EKG EKG Interpretation Date/Time:  Saturday February 28 2023 10:25:54 EST Ventricular Rate:  80 PR Interval:  178 QRS Duration:  87 QT Interval:  376 QTC  Calculation: 434 R Axis:   51  Text Interpretation: Sinus rhythm Atrial premature complex Confirmed by Lorre Nick ([id number]) on 02/28/2023 11:29:58 AM  Radiology No results found.  Procedures .Critical Care  Performed by: Elpidio Anis, PA-C Authorized by: Elpidio Anis, PA-C   Critical care provider statement:    Critical care time (minutes):  30   Critical care was necessary to treat or prevent imminent or life-threatening deterioration of the following conditions: Anemia <7.   Critical care was time spent personally by me on the following activities:  Discussions with consultants, examination of patient, ordering and review of laboratory studies and review of old charts     Medications Ordered in ED Medications  0.9 %  sodium chloride infusion (Manually program via Guardrails IV Fluids) (0 mLs Intravenous Hold 02/28/23 1215)  pantoprazole (PROTONIX) injection 40 mg (has no administration in time range)    ED Course/ Medical Decision Making/ A&P                                 Medical Decision Making This patient presents to the ED for concern of anemia, this involves an extensive number of treatment options, and is a complaint that carries with it a high risk of complications and morbidity.  The differential diagnosis includes bleeding, blood dyscrasias,    Co morbidities that complicate the patient evaluation  HLD, HTN, COPD, CVA, duodenal ulcer disease   Additional history obtained:  External records from outside source obtained and reviewed including Pulmonary Tobey Grim, [name]) office visit 11/14, summarized in HPI.    Lab Tests:  I Ordered, and personally interpreted labs.  The pertinent results include:  Hgb 6.4 (7.1), microcytic (MCV 75.5); mild hyponatremia 133.     Cardiac Monitoring: / EKG:  The patient was maintained on a cardiac monitor.  I personally viewed and interpreted the cardiac monitored which showed an underlying rhythm of: NSR, rate  80   Consultations Obtained:  I requested consultation with the Hospitalist,  and discussed lab and imaging findings as well as pertinent plan - request for admission   Problem List / ED Course / Critical interventions / Medication management  Hgb 7.1 on 11/14 office visit. Hemodynamically stable with normal BP (138/69), HR 93, O2 sat 96% Recheck labs to verify Hgb level. No obvious source of active bleeding: h/o duodenal ulcers but no abdominal pain, vomiting, melena.   *recheck Hgb = 6.4 - transfusion started Guaiac stool card collected - no frank blood, brown stool   Social Determinants of Health:  Lives alone, has multiple pets to arrange care fot   Test / Admission -  Considered:  Patient with significant drop in Hgb in 2 days from 7.1 to 6.4. No obvious source. Last comparable hgb 2/22 and was 13.4 Hospitalist paged for admission.     Amount and/or Complexity of Data Reviewed Labs: ordered.  Risk Prescription drug management. Decision regarding hospitalization.           Final Clinical Impression(s) / ED Diagnoses Final diagnoses:  Symptomatic anemia    Rx / DC Orders ED Discharge Orders     None         Danne Harbor 02/28/23 1302    Lorre Nick, [name] 03/01/23 [phone]

## 2023-02-28 NOTE — H&P (Signed)
History and Physical    Patient: Grace [name] ZOX:[id number] DOB: December 25, 1955 DOA: 02/28/2023 DOS: the patient was seen and examined on 02/28/2023 PCP: Estevan Oaks, NP  Patient coming from: Home  Chief Complaint:  Chief Complaint  Patient presents with   Abnormal Lab   HPI: [name] is a 67 y.o. female with medical history significant of former smoker, COPD, hypertension, OSA does not use CPAP, GERD, Barrett's esophagus, hypothyroidism, multinodular goiter, diabetes, hyperlipidemia, obesity, had COVID at the end of August presented for follow-up at his pulmonologist office and labs were obtained for fatigue and shortness of breath.  Patient was found to have a hemoglobin of 7.0 she was referred to the ED for further evaluation.  Hemoglobin in the ED at 6. Patient reports shortness of breath on exertion and fatigue for the last couple of weeks.  She has been feeling weak and tired.  She relates shortness of breath even walking from the kitchen to the bathroom.  She has been having some chest pain especially at night.  Her cardiology  increased her Imdur.  Last episode of chest pain was 3 days prior to admission sharp in quality.  Resolve on its own.  She also report a history of on and off diarrhea loose and watery stool.  Her last bowel movement was the day prior to admission with was a formed stool.  She denies melena, hematochezia hematemesis. She follows with [facility] she had endoscopy and colonoscopy few years ago   Evaluation in the ED: Sodium 132, potassium 3.7, glucose 119, BUN 13, creatinine 0.7, calcium 8.5, albumin 3.4, liver function test normal, hemoglobin 6.4, platelets 263, white blood cells 5.1 fecal occult negative.  2 units of packed red blood cell have been ordered. No patient has history of sleep apnea, she does not use her CPAP because she is claustrophobic  Review of Systems: As mentioned in the history of present illness. All other systems reviewed and  are negative. Past Medical History:  Diagnosis Date   Abnormal vaginal bleeding    Anxiety    Asthma    Barrett's esophagus    with high grade dysplasia per endoscopy 05/2005 // followed by Dr. Stan Head ([facility])   Chronic back pain    COPD (chronic obstructive pulmonary disease) (HCC)    Diabetes mellitus without complication (HCC)    type 2   Duodenal ulcer disease    thought to be contributed at least partly by overuse of NSAIDs and salicylates (goody powdr)   Endometrial polyp 03/2003   s/p resection in 03/2003. 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   Migraine    Nevus    Personal history of  failed conscious sedation 08/11/2011   Sleep apnea    does not use cpap   Stroke (HCC) 10/2000   Tachycardia    Thyroid disease    Tobacco abuse Jan 2014   quit   Past Surgical History:  Procedure Laterality Date   ADENOIDECTOMY     APPENDECTOMY  2017   BALLOON DILATION N/A 06/14/2020   Procedure: BALLOON DILATION;  Surgeon: Meridee Score Netty Starring., MD;  [facility] ENDOSCOPY;  Service: Gastroenterology;  Laterality: N/A;   BIOPSY  02/15/2020   Procedure: BIOPSY;  Surgeon: Willis Modena, MD;  Location: [facility] ENDOSCOPY;  Service: Endoscopy;;   BIOPSY  06/14/2020   Procedure: BIOPSY;  Surgeon:  Lemar Lofty., MD;  Location: [facility] ENDOSCOPY;  Service: Gastroenterology;;   BRONCHIAL BIOPSY  03/15/2020   Procedure: BRONCHIAL BIOPSIES;  Surgeon: Josephine Igo, DO;  Location: [facility] ENDOSCOPY;  Service: Pulmonary;;   BRONCHIAL BRUSHINGS  03/15/2020   Procedure: BRONCHIAL BRUSHINGS;  Surgeon: Josephine Igo, DO;  Location: [facility] ENDOSCOPY;  Service: Pulmonary;;   BRONCHIAL NEEDLE ASPIRATION BIOPSY  03/15/2020   Procedure: BRONCHIAL NEEDLE ASPIRATION BIOPSIES;  Surgeon: Josephine Igo, DO;  Location: [facility] ENDOSCOPY;  Service: Pulmonary;;   BRONCHIAL WASHINGS  03/15/2020   Procedure: BRONCHIAL WASHINGS;  Surgeon: Josephine Igo, DO;  Location: [facility] ENDOSCOPY;  Service: Pulmonary;;   CHOLECYSTECTOMY     COLONOSCOPY  02/07/2010   COLONOSCOPY WITH PROPOFOL N/A 02/15/2020   Procedure: COLONOSCOPY WITH PROPOFOL;  Surgeon: Willis Modena, MD;  Location: [facility] ENDOSCOPY;  Service: Endoscopy;  Laterality: N/A;   DILATION AND CURETTAGE OF UTERUS     ENDOSCOPIC MUCOSAL RESECTION N/A 06/14/2020   Procedure: ENDOSCOPIC MUCOSAL RESECTION;  Surgeon: Meridee Score Netty Starring., MD;  Location: [facility] ENDOSCOPY;  Service: Gastroenterology;  Laterality: N/A;   ESOPHAGOGASTRODUODENOSCOPY (EGD) WITH PROPOFOL N/A 02/15/2020   Procedure: ESOPHAGOGASTRODUODENOSCOPY (EGD) WITH PROPOFOL;  Surgeon: Willis Modena, MD;  Location: [facility] ENDOSCOPY;  Service: Endoscopy;  Laterality: N/A;   ESOPHAGOGASTRODUODENOSCOPY (EGD) WITH PROPOFOL N/A 06/14/2020   Procedure: ESOPHAGOGASTRODUODENOSCOPY (EGD) WITH PROPOFOL;  Surgeon: Meridee Score Netty Starring., MD;  Location: [facility] ENDOSCOPY;  Service: Gastroenterology;  Laterality: N/A;   FOREIGN BODY REMOVAL  06/14/2020   Procedure: FOREIGN BODY REMOVAL;  Surgeon: Meridee Score, Netty Starring., MD;  Location: [facility] ENDOSCOPY;  Service: Gastroenterology;;   HYSTEROSCOPY     LAPAROSCOPIC APPENDECTOMY N/A 09/23/2015   Procedure: APPENDECTOMY LAPAROSCOPIC;  Surgeon: Violeta Gelinas, MD;  Location: [facility] OR;  Service:  General;  Laterality: N/A;   MYOMECTOMY  1990, 1997   x 2 - In 1997, noted to have extensive pelvic adhesions and BL tubal obstruction   POLYPECTOMY  02/15/2020   Procedure: POLYPECTOMY;  Surgeon: Willis Modena, MD;  Location: [facility] ENDOSCOPY;  Service: Endoscopy;;   TONSILLECTOMY     TRANSTHORACIC ECHOCARDIOGRAM  06/2014    EF 55-60%.  No RWMA. Gr 1 DD. Cannot R/o PFO (consider bubble study).   UPPER ESOPHAGEAL ENDOSCOPIC ULTRASOUND (EUS) N/A 06/14/2020   Procedure: UPPER ESOPHAGEAL ENDOSCOPIC ULTRASOUND (EUS);  Surgeon: Lemar Lofty., MD;  Location: [facility] ENDOSCOPY;  Service: Gastroenterology;  Laterality: N/A;   UPPER GASTROINTESTINAL ENDOSCOPY  12/18/2009   VIDEO BRONCHOSCOPY WITH ENDOBRONCHIAL NAVIGATION N/A 03/15/2020   Procedure: VIDEO BRONCHOSCOPY WITH ENDOBRONCHIAL NAVIGATION;  Surgeon: Josephine Igo, DO;  Location: [facility] ENDOSCOPY;  Service: Pulmonary;  Laterality: N/A;   VIDEO BRONCHOSCOPY WITH ENDOBRONCHIAL ULTRASOUND  03/15/2020   Procedure: VIDEO BRONCHOSCOPY WITH ENDOBRONCHIAL ULTRASOUND;  Surgeon: Josephine Igo, DO;  Location: [facility] ENDOSCOPY;  Service: Pulmonary;;  WISDOM TOOTH EXTRACTION     Social History:  reports that she quit smoking about 7 years ago. Her smoking use included cigarettes. She started smoking about 57 years ago. She has a 150 pack-year smoking history. She has never used smokeless tobacco. She reports that she does not drink alcohol and does not use drugs.  Allergies  Allergen Reactions   Amlodipine     Cramps    Codeine     REACTION: headaches   Gabapentin     headache   Prozac [Fluoxetine Hcl] Other (See Comments)    Suicidal ideations    Family History  Problem Relation Age of Onset   COPD Mother    Heart disease Father 74       had 5 heart attacks, first in late 63s   COPD Father    Colon cancer Neg Hx    Esophageal cancer Neg Hx    Inflammatory bowel disease Neg Hx    Liver disease Neg Hx    Pancreatic cancer Neg Hx    Rectal  cancer Neg Hx    Stomach cancer Neg Hx    Thyroid disease Neg Hx     Prior to Admission medications   Medication Sig Start Date End Date Taking? Authorizing Provider  albuterol (PROAIR HFA) 108 (90 Base) MCG/ACT inhaler INHALE 2 PUFFS EVERY 6  HOURS AS NEEDED FOR  SHORTNESS OF BREATH OR  WHEEZING Patient taking differently: Inhale 2 puffs into the lungs every 6 (six) hours as needed for wheezing. 05/17/19   Kyliegh Seller, NP  AMBULATORY NON FORMULARY MEDICATION Medication Name: GI cocktail  90 ML viscous lidocaine  90 ML 10 mg/5 ml Dicyclomine  270 ML Maalox  Swish and swallow 30 ML four times daily for 3 days 06/14/20   Mansouraty, Netty Starring., MD  Aspirin-Acetaminophen (GOODYS BODY PAIN PO) Take 1 Package by mouth daily as needed (pain/headache).    [provider]  bisoprolol (ZEBETA) 10 MG tablet Take 10 mg by mouth daily. 04/19/20   [provider]  Budeson-Glycopyrrol-Formoterol (BREZTRI AEROSPHERE) 160-9-4.8 MCG/ACT AERO Inhale 2 puffs into the lungs in the morning and at bedtime. 02/26/23   Cobb, Ruby Cola, NP  calcium carbonate (TUMS - DOSED IN MG ELEMENTAL CALCIUM) 500 MG chewable tablet Chew 1 tablet by mouth daily as needed for indigestion or heartburn.    [provider]  COLLAGEN-VITAMIN C PO Take 3 tablets by mouth in the morning and at bedtime.    [provider]  estradiol (ESTRACE VAGINAL) 0.1 MG/GM vaginal cream Place 1 Applicatorful vaginally at bedtime. Patient taking differently: Place 1 Applicatorful vaginally 2 (two) times a week. 08/19/19   Marirose Seller, NP  ezetimibe (ZETIA) 10 MG tablet Take 10 mg by mouth daily. 09/29/22   [provider]  fluticasone (FLONASE) 50 MCG/ACT nasal spray Place 2 sprays into both nostrils daily. 01/09/23   Cobb, Ruby Cola, NP  furosemide (LASIX) 40 MG tablet Take 1 tab daily as needed for weight gain of 3 lb over night or 5 lb in 1 week. 12/31/22   Joylene Grapes, NP  glipiZIDE (GLUCOTROL  XL) 5 MG 24 hr tablet TAKE 1 TABLET BY MOUTH  DAILY WITH BREAKFAST Patient taking differently: Take 5 mg by mouth every evening. 09/05/19   Umi Seller, NP  glucose blood (ONETOUCH VERIO) test strip Use to check blood sugar twice daily. Dx: E11.9 08/25/18   Jera Seller, NP  guaiFENesin (MUCINEX) 600 MG 12  hr tablet Take 600 mg by mouth 2 (two) times daily as needed for to loosen phlegm or cough.    [provider]  hydrALAZINE (APRESOLINE) 50 MG tablet TAKE 1 TABLET BY MOUTH 3  TIMES DAILY Patient taking differently: Take 50 mg by mouth 3 (three) times daily. 07/25/19   Sehaj Seller, NP  ibuprofen (ADVIL) 200 MG tablet Take 800 mg by mouth every 8 (eight) hours as needed for moderate pain.    [provider]  ipratropium-albuterol (DUONEB) 0.5-2.5 (3) MG/3ML SOLN Take 3 mLs by nebulization every 6 (six) hours as needed. 02/07/19   Luciano Cutter, MD  isosorbide mononitrate (IMDUR) 30 MG 24 hr tablet Take 2 tablets (60 mg total) by mouth daily. 01/15/23   Joylene Grapes, NP  Lancets (ONETOUCH DELICA PLUS LANCET33G) MISC USE TO CHECK BLOOD SUGAR  TWICE DAILY 04/01/19   Vertis Seller, NP  levothyroxine (SYNTHROID) 200 MCG tablet Take 200 mcg by mouth daily before breakfast.    [provider]  losartan (COZAAR) 100 MG tablet TAKE 1 TABLET BY MOUTH  DAILY Patient taking differently: Take 100 mg by mouth every evening. 07/20/19   Mallary Seller, NP  omeprazole (PRILOSEC) 40 MG capsule Take 1 capsule (40 mg total) by mouth 2 (two) times daily before a meal. Take 30- 60 min before your first and last meals of the day 06/14/20   Mansouraty, Netty Starring., MD  predniSONE (DELTASONE) 10 MG tablet 4 tabs for 3 days, then 3 tabs for 3 days, 2 tabs for 3 days, then 1 tab for 3 days, then stop 01/09/23   Cobb, Ruby Cola, NP  pregabalin (LYRICA) 50 MG capsule Take 50 mg by mouth daily. 10/13/22   [provider]  REPATHA SURECLICK 140 MG/ML SOAJ INJECT 1 PEN  SUBCUTANEOUSLY  EVERY 2 WEEKS 02/20/23   Marykay Lex, MD  Respiratory Therapy Supplies (NEBULIZER/TUBING/MOUTHPIECE) KIT 1 application by Does not apply route every 6 (six) hours as needed. FAX TO [phone] 07/19/19   Amaria Seller, NP  TRELEGY ELLIPTA 200-62.5-25 MCG/ACT AEPB Inhale 1 puff into the lungs daily. 12/11/22   [provider]  valACYclovir (VALTREX) 500 MG tablet TAKE 1 TABLET BY MOUTH  DAILY Patient taking differently: Take 500 mg by mouth daily. 11/23/19   Allecia Seller, NP  ipratropium (ATROVENT HFA) 17 MCG/ACT inhaler Inhale 2 puffs into the lungs 4 (four) times daily. 04/25/11 06/30/11  Danley Danker, MD    Physical Exam: Vitals:   02/28/23 0950 02/28/23 0951 02/28/23 0956  BP: 138/69 138/69   Pulse: 93 95   Resp: 18    Temp: 98.4 F (36.9 C)    TempSrc: Oral    SpO2: 96% 96%   Weight:   82.6 kg  Height:   5\' 3"  (1.6 m)   General; Alert in no distress CVS: S 1, S 2 RRR Lungs:CTA, normal respiratory effort.  Abdomen: BS present, soft, nt, ND Extremities. No edema   Data Reviewed:  Labs reviewed.   Assessment and Plan: No notes have been filed under this hospital service. Service: Hospitalist  1-Severe anemia, symptomatic Patient presented with shortness of breath on exertion, sporadic episodes of chest pain, fatigue weakness Hemoglobin found to be at 6.7. Denies melena, hematochezia, hematemesis No hyperbilirubinemia on likely hemolysis IV Protonix started.  GI has been consulted Patient to be transfused 2 units of packed red blood cell Hold aspirin.   2-COPD: Resume albuterol and Bestri.  3-DM type 2;  SSI while in hospital.  Hold Glipizide.   4-HTN, CAD;  Resume Bystolic and Imdur.  Hold COzaar for now.   5-History of CVA; hold aspirin   6-Hypothyroidism; resume Synthroid.   7-OSA; Not compliant with CPAP./     Advance Care Planning:   Code Status: Prior She wishes to be DNR  Consults: GI, [place].    Family Communication: Care discussed with patient.   Severity of Illness: The appropriate patient status for this patient is OBSERVATION. Observation status is judged to be reasonable and necessary in order to provide the required intensity of service to ensure the patient's safety. The patient's presenting symptoms, physical exam findings, and initial radiographic and laboratory data in the context of their medical condition is felt to place them at decreased risk for further clinical deterioration. Furthermore, it is anticipated that the patient will be medically stable for discharge from the hospital within 2 midnights of admission.   Author: Alba Cory, MD 02/28/2023 12:21 PM  For on call review [email or web address].

## 2023-02-28 NOTE — ED Triage Notes (Signed)
Pt presents d/t Hgb 7.1.  Pt reports increased dyspnea w/ exertion and fatigue.  Hx of COPD.  Pt denies abnormal bleeding.

## 2023-03-01 DIAGNOSIS — E785 Hyperlipidemia, unspecified: Secondary | ICD-10-CM | POA: Diagnosis present

## 2023-03-01 DIAGNOSIS — E119 Type 2 diabetes mellitus without complications: Secondary | ICD-10-CM | POA: Diagnosis present

## 2023-03-01 DIAGNOSIS — K315 Obstruction of duodenum: Secondary | ICD-10-CM | POA: Diagnosis present

## 2023-03-01 DIAGNOSIS — Z8249 Family history of ischemic heart disease and other diseases of the circulatory system: Secondary | ICD-10-CM | POA: Diagnosis not present

## 2023-03-01 DIAGNOSIS — E042 Nontoxic multinodular goiter: Secondary | ICD-10-CM | POA: Diagnosis present

## 2023-03-01 DIAGNOSIS — D649 Anemia, unspecified: Secondary | ICD-10-CM

## 2023-03-01 DIAGNOSIS — E66811 Obesity, class 1: Secondary | ICD-10-CM | POA: Diagnosis present

## 2023-03-01 DIAGNOSIS — J4489 Other specified chronic obstructive pulmonary disease: Secondary | ICD-10-CM | POA: Diagnosis present

## 2023-03-01 DIAGNOSIS — D509 Iron deficiency anemia, unspecified: Secondary | ICD-10-CM | POA: Diagnosis present

## 2023-03-01 DIAGNOSIS — Z8673 Personal history of transient ischemic attack (TIA), and cerebral infarction without residual deficits: Secondary | ICD-10-CM | POA: Diagnosis not present

## 2023-03-01 DIAGNOSIS — K219 Gastro-esophageal reflux disease without esophagitis: Secondary | ICD-10-CM | POA: Diagnosis present

## 2023-03-01 DIAGNOSIS — K2270 Barrett's esophagus without dysplasia: Secondary | ICD-10-CM | POA: Diagnosis present

## 2023-03-01 DIAGNOSIS — Z7984 Long term (current) use of oral hypoglycemic drugs: Secondary | ICD-10-CM | POA: Diagnosis not present

## 2023-03-01 DIAGNOSIS — E039 Hypothyroidism, unspecified: Secondary | ICD-10-CM | POA: Diagnosis present

## 2023-03-01 DIAGNOSIS — Z79890 Hormone replacement therapy (postmenopausal): Secondary | ICD-10-CM | POA: Diagnosis not present

## 2023-03-01 DIAGNOSIS — Z8616 Personal history of COVID-19: Secondary | ICD-10-CM | POA: Diagnosis not present

## 2023-03-01 DIAGNOSIS — Z66 Do not resuscitate: Secondary | ICD-10-CM | POA: Diagnosis present

## 2023-03-01 DIAGNOSIS — Z6832 Body mass index (BMI) 32.0-32.9, adult: Secondary | ICD-10-CM | POA: Diagnosis not present

## 2023-03-01 DIAGNOSIS — K3189 Other diseases of stomach and duodenum: Secondary | ICD-10-CM | POA: Diagnosis present

## 2023-03-01 DIAGNOSIS — Z91199 Patient's noncompliance with other medical treatment and regimen due to unspecified reason: Secondary | ICD-10-CM | POA: Diagnosis not present

## 2023-03-01 DIAGNOSIS — G4733 Obstructive sleep apnea (adult) (pediatric): Secondary | ICD-10-CM | POA: Diagnosis present

## 2023-03-01 DIAGNOSIS — Z87891 Personal history of nicotine dependence: Secondary | ICD-10-CM | POA: Diagnosis not present

## 2023-03-01 DIAGNOSIS — I10 Essential (primary) hypertension: Secondary | ICD-10-CM | POA: Diagnosis present

## 2023-03-01 DIAGNOSIS — I2510 Atherosclerotic heart disease of native coronary artery without angina pectoris: Secondary | ICD-10-CM | POA: Diagnosis present

## 2023-03-01 DIAGNOSIS — Z888 Allergy status to other drugs, medicaments and biological substances status: Secondary | ICD-10-CM | POA: Diagnosis not present

## 2023-03-01 LAB — GLUCOSE, CAPILLARY
Glucose-Capillary: 104 mg/dL — ABNORMAL HIGH (ref 70–99)
Glucose-Capillary: 111 mg/dL — ABNORMAL HIGH (ref 70–99)
Glucose-Capillary: 94 mg/dL (ref 70–99)
Glucose-Capillary: 89 mg/dL (ref 70–99)

## 2023-03-01 LAB — CBC
HCT: 31.6 % — ABNORMAL LOW (ref 36.0–46.0)
Hemoglobin: 9 g/dL — ABNORMAL LOW (ref 12.0–15.0)
MCH: 21.6 pg — ABNORMAL LOW (ref 26.0–34.0)
MCHC: 28.5 g/dL — ABNORMAL LOW (ref 30.0–36.0)
MCV: 76 fL — ABNORMAL LOW (ref 80.0–100.0)
Platelets: 245 10*3/uL (ref 150–400)
RBC: 4.16 MIL/uL (ref 3.87–5.11)
RDW: 20.5 % — ABNORMAL HIGH (ref 11.5–15.5)
WBC: 6.8 10*3/uL (ref 4.0–10.5)
nRBC: 0 % (ref 0.0–0.2)

## 2023-03-01 LAB — BASIC METABOLIC PANEL
Anion gap: 6 (ref 5–15)
BUN: 9 mg/dL (ref 8–23)
CO2: 26 mmol/L (ref 22–32)
Calcium: 8.9 mg/dL (ref 8.9–10.3)
Chloride: 105 mmol/L (ref 98–111)
Creatinine, Ser: 0.82 mg/dL (ref 0.44–1.00)
GFR, Estimated: >60 mL/min (ref >60)
Glucose, Bld: 115 mg/dL — ABNORMAL HIGH (ref 70–99)
Potassium: 4.2 mmol/L (ref 3.5–5.1)
Sodium: 137 mmol/L (ref 135–145)

## 2023-03-01 LAB — HIV ANTIBODY (ROUTINE TESTING W REFLEX): HIV Screen 4th Generation wRfx: NONREACTIVE

## 2023-03-01 MED ORDER — SODIUM CHLORIDE 0.9 % IV SOLN
100.0000 mg | Freq: Once | INTRAVENOUS | Status: AC
  Administered 2023-03-01: 100 mg via INTRAVENOUS
  Filled 2023-03-01: qty 5

## 2023-03-01 NOTE — Progress Notes (Signed)
PROGRESS NOTE    Grace [name]  ZOX:[id number] DOB: April 22, 1955 DOA: 02/28/2023 PCP: Estevan Oaks, NP   Brief Narrative: Grace [name] is a 67 y.o. female with medical history significant of former smoker, COPD, hypertension, OSA does not use CPAP, GERD, Barrett's esophagus, hypothyroidism, multinodular goiter, diabetes, hyperlipidemia, obesity, had COVID at the end of August presented for follow-up at his pulmonologist office and labs were obtained for fatigue and shortness of breath.  Patient was found to have a hemoglobin of 7.0. She was referred to the ED for further evaluation.  Hemoglobin in the ED at 6. Patient reports shortness of breath on exertion and fatigue for the last couple of weeks.  She has been feeling weak and tired.  She relates shortness of breath even walking from the kitchen to the bathroom.  She has been having some chest pain especially at night.  Her cardiology  increased her Imdur.  Last episode of chest pain was 3 days prior to admission sharp in quality.  Resolve on its own.  She also report a history of on and off diarrhea loose and watery stool.  Her last bowel movement was the day prior to admission with was a formed stool.  She denies melena, hematochezia hematemesis.  2 units of packed red blood cells were ordered on admission.  Patient was admitted for further evaluation.  Dr. Dulce Sellar will see patient in consultation  Assessment & Plan:   Principal Problem:   Severe anemia  1-Severe anemia, symptomatic History of Barrett Esophagus.  Iron Deficiency Anemia Patient presented with shortness of breath on exertion, sporadic episodes of chest pain, fatigue weakness Hemoglobin found to be at 6.7. Denies melena, hematochezia, hematemesis No hyperbilirubinemia less likely hemolysis IV Protonix started.  GI has been consulted. Plan endoscopy tomorrow.  S/P  2 units of packed red blood cell 11/16.  Hold aspirin.  Will give IV iron Hb increase to  9  2-COPD: Continue with  albuterol and Bestri.    3-DM type 2;  SSI while in hospital.  Hold Glipizide.    4-HTN, CAD;  Resume Bystolic and Imdur.  Hold COzaar for now.    5-History of CVA; hold aspirin    6-Hypothyroidism; resume Synthroid.    7-OSA; Not compliant with CPAP./    Estimated body mass index is 32.24 kg/m as calculated from the following:   Height as of this encounter: 5\' 3"  (1.6 m).   Weight as of this encounter: 82.6 kg.   DVT prophylaxis: SCD Code Status: DNR Family Communication: Care discussed with patient.  Disposition Plan:  Status is: Observation The patient remains OBS appropriate and will d/c before 2 midnights.    Consultants:  Dr Dulce Sellar  Procedures:  none  Antimicrobials:    Subjective: She is feeling better. No real chest pain.  Denies melena.   Objective: Vitals:   02/28/23 1930 02/28/23 1943 03/01/23 0014 03/01/23 0333  BP:  (!) 159/82 (!) 156/73 139/63  Pulse:  75 80 72  Resp:  18 18 14   Temp:  97.9 F (36.6 C) 98.5 F (36.9 C) 98.3 F (36.8 C)  TempSrc:  Oral Oral Oral  SpO2: 96% 98% 97% 94%  Weight:      Height:        Intake/Output Summary (Last 24 hours) at 03/01/2023 0816 Last data filed at 02/28/2023 1900 Gross per 24 hour  Intake 1873.92 ml  Output --  Net 1873.92 ml   Filed Weights   02/28/23 0956  Weight: 82.6 kg    Examination:  General exam: Appears calm and comfortable  Respiratory system: Clear to auscultation. Respiratory effort normal. Cardiovascular system: S1 & S2 heard, RRR. No JVD, murmurs, rubs, gallops or clicks. No pedal edema. Gastrointestinal system: Abdomen is nondistended, soft and nontender. No organomegaly or masses felt. Normal bowel sounds heard. Central nervous system: Alert and oriented. No focal neurological deficits. Extremities: Symmetric 5 x 5 power.    Data Reviewed: I have personally reviewed following labs and imaging studies  CBC: Recent Labs  Lab  02/26/23 1437 02/28/23 1033 03/01/23 0726  WBC 5.5 5.1 6.8  NEUTROABS 3.2 2.9  --   HGB 7.1 Repeated and verified X2.* 6.4* 9.0*  HCT 26.3 Repeated and verified X2.* 24.9* 31.6*  MCV 69.5 Repeated and verified X2.* 75.5* 76.0*  PLT 281.0 263 245   Basic Metabolic Panel: Recent Labs  Lab 02/26/23 1437 02/28/23 1033 03/01/23 0726  NA 139 132* 137  K 4.4 3.7 4.2  CL 106 102 105  CO2 25 22 26   GLUCOSE 99 119* 115*  BUN 15 13 9   CREATININE 1.04 0.74 0.82  CALCIUM 9.2 8.5* 8.9   GFR: Estimated Creatinine Clearance: 67.8 mL/min (by C-G formula based on SCr of 0.82 mg/dL). Liver Function Tests: Recent Labs  Lab 02/28/23 1033  AST 19  ALT 13  ALKPHOS 50  BILITOT 0.3  PROT 6.5  ALBUMIN 3.4*   No results for input(s): "LIPASE", "AMYLASE" in the last 168 hours. No results for input(s): "AMMONIA" in the last 168 hours. Coagulation Profile: No results for input(s): "INR", "PROTIME" in the last 168 hours. Cardiac Enzymes: No results for input(s): "CKTOTAL", "CKMB", "CKMBINDEX", "TROPONINI" in the last 168 hours. BNP (last 3 results) Recent Labs    02/26/23 1437  PROBNP 234.0*   HbA1C: Recent Labs    02/28/23 1428  HGBA1C 6.9*   CBG: Recent Labs  Lab 02/28/23 1640 02/28/23 2111 03/01/23 0752  GLUCAP 78 103* 104*   Lipid Profile: No results for input(s): "CHOL", "HDL", "LDLCALC", "TRIG", "CHOLHDL", "LDLDIRECT" in the last 72 hours. Thyroid Function Tests: No results for input(s): "TSH", "T4TOTAL", "FREET4", "T3FREE", "THYROIDAB" in the last 72 hours. Anemia Panel: Recent Labs    02/28/23 1033 02/28/23 1427  VITAMINB12  --  569  FOLATE  --  22.3  FERRITIN  --  3*  TIBC  --  391  IRON  --  15*  RETICCTPCT 1.9  --    Sepsis Labs: No results for input(s): "PROCALCITON", "LATICACIDVEN" in the last 168 hours.  No results found for this or any previous visit (from the past 240 hour(s)).       Radiology Studies: No results found.      Scheduled  Meds:  sodium chloride   Intravenous Once   bisoprolol  10 mg Oral Daily   ezetimibe  10 mg Oral Daily   fluticasone  2 spray Each Nare Daily   insulin aspart  0-6 Units Subcutaneous TID WC   isosorbide mononitrate  60 mg Oral Daily   levothyroxine  200 mcg Oral Q0600   mometasone-formoterol  2 puff Inhalation BID   And   umeclidinium bromide  1 puff Inhalation Daily   pantoprazole (PROTONIX) IV  40 mg Intravenous Q12H   pregabalin  50 mg Oral Daily   sodium chloride flush  3 mL Intravenous Q12H   valACYclovir  500 mg Oral Daily   Continuous Infusions:  sodium chloride       LOS:  0 days    Time spent: 35 minutes    Reynard Christoffersen A Sunnie Nielsen, MD [facility]   If 7PM-7AM, please contact night-coverage [email or web address]  03/01/2023, 8:16 AM

## 2023-03-01 NOTE — Consult Note (Signed)
[facility] Consultation Note  Referring Provider: [facility] Primary Care Physician:  Estevan Oaks, NP  Reason for Consultation:  anemia  HPI: Grace [name] is a 67 y.o. female admitted with shortness of breath in setting of new onset anemia.  Has history of Barrett's esophagus with at least high grade dysplasia; I did endoscopy in 2021 and referred to Dr. Meridee Score for EMR which was done in 2022 with plans for follow-up RFA but patient did not follow-up with me or Dr. Meridee Score.  Had colonoscopy 2021 showing few small polyps as well.  She has had no abdominal pain, change in bowel habits, hematemesis, overt blood in stool, unintentional weight loss.  Has had some dysphagia for the past several months.   Past Medical History:  Diagnosis Date   Abnormal vaginal bleeding    Anxiety    Asthma    Barrett's esophagus    with high grade dysplasia per endoscopy 05/2005 // followed by Dr. Stan Head ([facility])   Chronic back pain    COPD (chronic obstructive pulmonary disease) (HCC)    Diabetes mellitus without complication (HCC)    type 2   Duodenal ulcer disease    thought to be contributed at least partly by overuse of NSAIDs and salicylates (goody powdr)   Endometrial polyp 03/2003   s/p resection in 03/2003. Path showing submucosal leiomyoma and benign  proliferative type endometrium   Family history of premature CAD 05/19/2018   Fibroid uterus    s/p myomectomy x 2   GERD (gastroesophageal reflux disease)    H/O failed moderate sedation    Hiatal hernia    History of CVA (cerebrovascular accident) 10/2000   History of pineal cyst    11 mm cystic mass noted in the pineal gland per MRI in 2001 - most consitent with simple pineal cyst   HSV infection    Hyperlipidemia    Hypertension    Hypothyroidism    with hx of multinodular goiter (noted on neck US in 11/2002 - showing diffuse nodularity and inhomogenous texture diffusely BL)   Internal hemorrhoids     grade 2 per colonoscopy in 09/2006 - repeat colonoscopy rec in 5-10 years.   Migraine    Nevus    Personal history of failed conscious sedation 08/11/2011   Sleep apnea    does not use cpap   Stroke (HCC) 10/2000   Tachycardia    Thyroid disease    Tobacco abuse Jan 2014   quit    Past Surgical History:  Procedure Laterality Date   ADENOIDECTOMY     APPENDECTOMY  2017   BALLOON DILATION N/A 06/14/2020   Procedure: BALLOON DILATION;  Surgeon: Meridee Score Netty Starring., MD;  Location: [facility] ENDOSCOPY;  Service: Gastroenterology;  Laterality: N/A;   BIOPSY  02/15/2020   Procedure: BIOPSY;  Surgeon: Willis Modena, MD;  Location: [facility] ENDOSCOPY;  Service: Endoscopy;;   BIOPSY  06/14/2020   Procedure: BIOPSY;  Surgeon: Lemar Lofty., MD;  Location[facility] ENDOSCOPY;  Service: Gastroenterology;;   BRONCHIAL BIOPSY  03/15/2020   Procedure: BRONCHIAL BIOPSIES;  Surgeon: Josephine Igo, DO;  Location: [facility] ENDOSCOPY;  Service: Pulmonary;;   BRONCHIAL BRUSHINGS  03/15/2020   Procedure: BRONCHIAL BRUSHINGS;  Surgeon: Josephine Igo, DO;  Location: [facility] ENDOSCOPY;  Service: Pulmonary;;   BRONCHIAL NEEDLE ASPIRATION BIOPSY  03/15/2020   Procedure: BRONCHIAL NEEDLE ASPIRATION BIOPSIES;  Surgeon: Josephine Igo, DO;  Location: [facility] ENDOSCOPY;  Service: Pulmonary;;   BRONCHIAL WASHINGS  03/15/2020  Procedure: BRONCHIAL WASHINGS;  Surgeon: Josephine Igo, DO;  Location: [facility] ENDOSCOPY;  Service: Pulmonary;;   CHOLECYSTECTOMY     COLONOSCOPY  02/07/2010   COLONOSCOPY WITH PROPOFOL N/A 02/15/2020   Procedure: COLONOSCOPY WITH PROPOFOL;  Surgeon: Willis Modena, MD;  Location: [facility] ENDOSCOPY;  Service: Endoscopy;  Laterality: N/A;   DILATION AND CURETTAGE OF UTERUS     ENDOSCOPIC MUCOSAL RESECTION N/A 06/14/2020   Procedure: ENDOSCOPIC MUCOSAL RESECTION;  Surgeon: Meridee Score Netty Starring., MD;  Location: [facility] ENDOSCOPY;  Service: Gastroenterology;  Laterality: N/A;   ESOPHAGOGASTRODUODENOSCOPY (EGD) WITH PROPOFOL  N/A 02/15/2020   Procedure: ESOPHAGOGASTRODUODENOSCOPY (EGD) WITH PROPOFOL;  Surgeon: Willis Modena, MD;  Location: [facility] ENDOSCOPY;  Service: Endoscopy;  Laterality: N/A;   ESOPHAGOGASTRODUODENOSCOPY (EGD) WITH PROPOFOL N/A 06/14/2020   Procedure: ESOPHAGOGASTRODUODENOSCOPY (EGD) WITH PROPOFOL;  Surgeon: Meridee Score Netty Starring., MD;  Location: [facility] ENDOSCOPY;  Service: Gastroenterology;  Laterality: N/A;   FOREIGN BODY REMOVAL  06/14/2020   Procedure: FOREIGN BODY REMOVAL;  Surgeon: Meridee Score, Netty Starring., MD;  Location: [facility] ENDOSCOPY;  Service: Gastroenterology;;   HYSTEROSCOPY     LAPAROSCOPIC APPENDECTOMY N/A 09/23/2015   Procedure: APPENDECTOMY LAPAROSCOPIC;  Surgeon: Violeta Gelinas, MD;  Location: [facility] OR;  Service: General;  Laterality: N/A;   MYOMECTOMY  1990, 1997   x 2 - In 1997, noted to have extensive pelvic adhesions and BL tubal obstruction   POLYPECTOMY  02/15/2020   Procedure: POLYPECTOMY;  Surgeon: Willis Modena, MD;  Location: [facility] ENDOSCOPY;  Service: Endoscopy;;   TONSILLECTOMY     TRANSTHORACIC ECHOCARDIOGRAM  06/2014    EF 55-60%.  No RWMA. Gr 1 DD. Cannot R/o PFO (consider bubble study).   UPPER ESOPHAGEAL ENDOSCOPIC ULTRASOUND (EUS) N/A 06/14/2020   Procedure: UPPER ESOPHAGEAL ENDOSCOPIC ULTRASOUND (EUS);  Surgeon: Lemar Lofty., MD;  Location: [facility] ENDOSCOPY;  Service: Gastroenterology;  Laterality: N/A;   UPPER GASTROINTESTINAL ENDOSCOPY  12/18/2009   VIDEO BRONCHOSCOPY WITH ENDOBRONCHIAL NAVIGATION N/A 03/15/2020   Procedure: VIDEO BRONCHOSCOPY WITH ENDOBRONCHIAL NAVIGATION;  Surgeon: Josephine Igo, DO;  Location: [facility] ENDOSCOPY;  Service: Pulmonary;  Laterality: N/A;   VIDEO BRONCHOSCOPY WITH ENDOBRONCHIAL ULTRASOUND  03/15/2020   Procedure: VIDEO BRONCHOSCOPY WITH ENDOBRONCHIAL ULTRASOUND;  Surgeon: Josephine Igo, DO;  Location: [facility] ENDOSCOPY;  Service: Pulmonary;;   WISDOM TOOTH EXTRACTION      Prior to Admission medications   Medication Sig Start Date End Date  Taking? Authorizing Provider  albuterol (PROAIR HFA) 108 (90 Base) MCG/ACT inhaler INHALE 2 PUFFS EVERY 6  HOURS AS NEEDED FOR  SHORTNESS OF BREATH OR  WHEEZING Patient taking differently: Inhale 2 puffs into the lungs every 6 (six) hours as needed for wheezing. 05/17/19  Yes Ginelle Seller, NP  Aspirin-Acetaminophen (GOODYS BODY PAIN PO) Take 1 Package by mouth daily as needed (pain/headache).   Yes [provider]  bisoprolol (ZEBETA) 10 MG tablet Take 10 mg by mouth daily. 04/19/20  Yes [provider]  Budeson-Glycopyrrol-Formoterol (BREZTRI AEROSPHERE) 160-9-4.8 MCG/ACT AERO Inhale 2 puffs into the lungs in the morning and at bedtime. 02/26/23  Yes Cobb, Ruby Cola, NP  calcium carbonate (TUMS - DOSED IN MG ELEMENTAL CALCIUM) 500 MG chewable tablet Chew 1 tablet by mouth daily as needed for indigestion or heartburn.   Yes [provider]  COLLAGEN-VITAMIN C PO Take 3 tablets by mouth in the morning and at bedtime.   Yes [provider]  estradiol (ESTRACE VAGINAL) 0.1 MG/GM vaginal cream Place 1 Applicatorful vaginally at bedtime. Patient taking differently: Place 1 Applicatorful  vaginally 2 (two) times a week. 08/19/19  Yes Tisa Seller, NP  ezetimibe (ZETIA) 10 MG tablet Take 10 mg by mouth daily. 09/29/22  Yes [provider]  fluticasone (FLONASE) 50 MCG/ACT nasal spray Place 2 sprays into both nostrils daily. 01/09/23  Yes Cobb, Ruby Cola, NP  furosemide (LASIX) 40 MG tablet Take 1 tab daily as needed for weight gain of 3 lb over night or 5 lb in 1 week. Patient taking differently: Take 40 mg by mouth See admin instructions. Take 1 tab daily as needed for weight gain of 3 lb over night or 5 lb in 1 week. 12/31/22  Yes Monge, Petra Kuba, NP  glipiZIDE (GLUCOTROL XL) 5 MG 24 hr tablet TAKE 1 TABLET BY MOUTH  DAILY WITH BREAKFAST Patient taking differently: Take 5 mg by mouth every evening. 09/05/19  Yes Eubanks, Janene Harvey, NP  guaiFENesin (MUCINEX)  600 MG 12 hr tablet Take 600 mg by mouth 2 (two) times daily as needed for to loosen phlegm or cough.   Yes [provider]  hydrALAZINE (APRESOLINE) 50 MG tablet TAKE 1 TABLET BY MOUTH 3  TIMES DAILY Patient taking differently: Take 50 mg by mouth 3 (three) times daily. 07/25/19  Yes Zamaria Seller, NP  ibuprofen (ADVIL) 200 MG tablet Take 800 mg by mouth every 8 (eight) hours as needed for moderate pain.   Yes [provider]  ipratropium-albuterol (DUONEB) 0.5-2.5 (3) MG/3ML SOLN Take 3 mLs by nebulization every 6 (six) hours as needed. 02/07/19  Yes Luciano Cutter, MD  isosorbide mononitrate (IMDUR) 30 MG 24 hr tablet Take 2 tablets (60 mg total) by mouth daily. 01/15/23  Yes Monge, Petra Kuba, NP  levothyroxine (SYNTHROID) 200 MCG tablet Take 200 mcg by mouth at bedtime.   Yes [provider]  losartan (COZAAR) 100 MG tablet TAKE 1 TABLET BY MOUTH  DAILY Patient taking differently: Take 100 mg by mouth every evening. 07/20/19  Yes Cady Seller, NP  mirabegron ER (MYRBETRIQ) 50 MG TB24 tablet Take 50 mg by mouth daily.   Yes [provider]  omeprazole (PRILOSEC) 40 MG capsule Take 1 capsule (40 mg total) by mouth 2 (two) times daily before a meal. Take 30- 60 min before your first and last meals of the day 06/14/20  Yes Mansouraty, Netty Starring., MD  pregabalin (LYRICA) 50 MG capsule Take 50 mg by mouth daily. 10/13/22  Yes [provider]  REPATHA SURECLICK 140 MG/ML SOAJ INJECT 1 PEN SUBCUTANEOUSLY  EVERY 2 WEEKS Patient taking differently: Inject 140 mg into the skin 2 (two) times a week. 02/20/23  Yes Marykay Lex, MD  valACYclovir (VALTREX) 500 MG tablet TAKE 1 TABLET BY MOUTH  DAILY Patient taking differently: Take 500 mg by mouth daily. 11/23/19  Yes Christionna Seller, NP  glucose blood (ONETOUCH VERIO) test strip Use to check blood sugar twice daily. Dx: E11.9 08/25/18   Yolandra Seller, NP  Lancets (ONETOUCH DELICA PLUS Collier Bullock) MISC  USE TO CHECK BLOOD SUGAR  TWICE DAILY 04/01/19   Edlin Seller, NP  predniSONE (DELTASONE) 10 MG tablet 4 tabs for 3 days, then 3 tabs for 3 days, 2 tabs for 3 days, then 1 tab for 3 days, then stop Patient not taking: Reported on 02/28/2023 01/09/23   Noemi Chapel, NP  Respiratory Therapy Supplies (NEBULIZER/TUBING/MOUTHPIECE) KIT 1 application by Does not apply route every 6 (six) hours as needed. FAX TO [phone] 07/19/19   Janyth Contes,  Janene Harvey, NP  TRELEGY ELLIPTA 200-62.5-25 MCG/ACT AEPB Inhale 1 puff into the lungs daily. Patient not taking: Reported on 02/28/2023 12/11/22   [provider]  ipratropium (ATROVENT HFA) 17 MCG/ACT inhaler Inhale 2 puffs into the lungs 4 (four) times daily. 04/25/11 06/30/11  Danley Danker, MD    Current Facility-Administered Medications  Medication Dose Route Frequency Provider Last Rate Last Admin   0.9 %  sodium chloride infusion (Manually program via Guardrails IV Fluids)   Intravenous Once Regalado, Belkys A, MD   Held at 02/28/23 1215   0.9 %  sodium chloride infusion  250 mL Intravenous PRN Regalado, Belkys A, MD       acetaminophen (TYLENOL) tablet 650 mg  650 mg Oral Q6H PRN Regalado, Belkys A, MD   650 mg at 03/01/23 0254   Or   acetaminophen (TYLENOL) suppository 650 mg  650 mg Rectal Q6H PRN Regalado, Belkys A, MD       albuterol (PROVENTIL) (2.5 MG/3ML) 0.083% nebulizer solution 2.5 mg  2.5 mg Nebulization Q2H PRN Regalado, Belkys A, MD       bisacodyl (DULCOLAX) EC tablet 5 mg  5 mg Oral Daily PRN Regalado, Belkys A, MD       bisoprolol (ZEBETA) tablet 10 mg  10 mg Oral Daily Regalado, Belkys A, MD   10 mg at 03/01/23 0843   ezetimibe (ZETIA) tablet 10 mg  10 mg Oral Daily Regalado, Belkys A, MD   10 mg at 03/01/23 0843   fluticasone (FLONASE) 50 MCG/ACT nasal spray 2 spray  2 spray Each Nare Daily Regalado, Belkys A, MD   2 spray at 03/01/23 0844   guaiFENesin (MUCINEX) 12 hr tablet 600 mg  600 mg Oral BID PRN Regalado,  Belkys A, MD   600 mg at 03/01/23 0843   HYDROcodone-acetaminophen (NORCO/VICODIN) 5-325 MG per tablet 1-2 tablet  1-2 tablet Oral Q4H PRN Regalado, Belkys A, MD   1 tablet at 03/01/23 0736   insulin aspart (novoLOG) injection 0-6 Units  0-6 Units Subcutaneous TID WC Regalado, Belkys A, MD       isosorbide mononitrate (IMDUR) 24 hr tablet 60 mg  60 mg Oral Daily Regalado, Belkys A, MD   60 mg at 03/01/23 0843   levothyroxine (SYNTHROID) tablet 200 mcg  200 mcg Oral Q0600 Regalado, Belkys A, MD   200 mcg at 03/01/23 0510   mometasone-formoterol (DULERA) 100-5 MCG/ACT inhaler 2 puff  2 puff Inhalation BID Regalado, Belkys A, MD   2 puff at 03/01/23 0857   And   umeclidinium bromide (INCRUSE ELLIPTA) 62.5 MCG/ACT 1 puff  1 puff Inhalation Daily Regalado, Belkys A, MD   1 puff at 03/01/23 0857   ondansetron (ZOFRAN) tablet 4 mg  4 mg Oral Q6H PRN Regalado, Belkys A, MD       Or   ondansetron (ZOFRAN) injection 4 mg  4 mg Intravenous Q6H PRN Regalado, Belkys A, MD       Oral care mouth rinse  15 mL Mouth Rinse PRN Regalado, Belkys A, MD       pantoprazole (PROTONIX) injection 40 mg  40 mg Intravenous Q12H Regalado, Belkys A, MD   40 mg at 03/01/23 0843   pregabalin (LYRICA) capsule 50 mg  50 mg Oral Daily Regalado, Belkys A, MD   50 mg at 03/01/23 0843   sodium chloride flush (NS) 0.9 % injection 3 mL  3 mL Intravenous Q12H Regalado, Belkys A, MD   3 mL at  03/01/23 0844   sodium chloride flush (NS) 0.9 % injection 3 mL  3 mL Intravenous PRN Regalado, Belkys A, MD       valACYclovir (VALTREX) tablet 500 mg  500 mg Oral Daily Regalado, Belkys A, MD   500 mg at 03/01/23 0843    Allergies as of 02/28/2023 - Review Complete 02/28/2023  Allergen Reaction Noted   Amlodipine  07/19/2018   Codeine     Gabapentin  01/22/2018   Prozac [fluoxetine hcl] Other (See Comments) 01/12/2015    Family History  Problem Relation Age of Onset   COPD Mother    Heart disease Father 35       had 5 heart attacks,  first in late 62s   COPD Father    Colon cancer Neg Hx    Esophageal cancer Neg Hx    Inflammatory bowel disease Neg Hx    Liver disease Neg Hx    Pancreatic cancer Neg Hx    Rectal cancer Neg Hx    Stomach cancer Neg Hx    Thyroid disease Neg Hx     Social History   Socioeconomic History   Marital status: Divorced    Spouse name: Not on file   Number of children: 0   Years of education: 12   Highest education level: High school graduate  Occupational History   Occupation: Electronic record    Comment: [employer]  Tobacco Use   Smoking status: Former    Current packs/day: 0.00    Average packs/day: 3.0 packs/day for 50.0 years (150.0 ttl pk-yrs)    Types: Cigarettes    Start date: 09/22/1965    Quit date: 09/23/2015    Years since quitting: 7.4   Smokeless tobacco: Never   Tobacco comments:    Counseling sheet 07-2011   Vaping Use   Vaping status: Never Used  Substance and Sexual Activity   Alcohol use: No    Alcohol/week: 0.0 standard drinks of alcohol   Drug use: No   Sexual activity: Never    Birth control/protection: Post-menopausal  Other Topics Concern   Not on file  Social History Narrative   Financial assistance approved for 100% discount at [facility] and has [facility] card per [employer]   01/15/2010      Diet- N/A   Caffeine- Goody powders   Married-Divorced   [employer] and lives with mother   Pets-2   Current/past profession-[employer]   Exercise-No   Living will-No   DNR-No   POA/HPOA-No         Social Determinants of Health   Financial Resource Strain: Medium Risk (02/23/2018)   Overall Financial Resource Strain (CARDIA)    Difficulty of Paying Living Expenses: Somewhat hard  Food Insecurity: No Food Insecurity (02/28/2023)   Hunger Vital Sign    Worried About Running Out of Food in the Last Year: Never true    Ran Out of Food in the Last Year: Never true  Transportation Needs: No Transportation Needs (02/28/2023)   PRAPARE - Scientist, research (physical sciences) (Medical): No    Lack of Transportation (Non-Medical): No  Physical Activity: Inactive (02/23/2018)   Exercise Vital Sign    Days of Exercise per Week: 0 days    Minutes of Exercise per Session: 0 min  Stress: Stress Concern Present (02/23/2018)   [employer] of Occupational Health - Occupational Stress Questionnaire    Feeling of Stress : Rather much  Social Connections: Unknown (09/02/2022)   [facility], [facility]   Social Network    Social Network: Not on file  Intimate Partner Violence: Not At Risk (02/28/2023)   Humiliation, Afraid, Rape, and Kick questionnaire    Fear of Current or Ex-Partner: No    Emotionally Abused: No    Physically Abused: No    Sexually Abused: No    Review of Systems: As per HPI, all others negative  Physical Exam: Vital signs in last 24 hours: Temp:  [97.9 F (36.6 C)-98.5 F (36.9 C)] 98.3 F (36.8 C) (11/17 0333) Pulse Rate:  [69-82] 72 (11/17 0333) Resp:  [14-18] 14 (11/17 0333) BP: (128-161)/(63-95) 139/63 (11/17 0333) SpO2:  [93 %-99 %] 93 % (11/17 0857) Last BM Date : 02/27/23 General:   Alert,  Well-developed, well-nourished, pleasant and cooperative in NAD Head:  Normocephalic and atraumatic. Eyes:  Sclera clear, no icterus.   Conjunctiva pale Ears:  Normal auditory acuity. Nose:  No deformity, discharge,  or lesions. Mouth:  No deformity or lesions.  Oropharynx pale and dry Neck:  Supple; no masses or thyromegaly. Lungs:  No visible distress Abdomen:  Soft, nontender and nondistended. No masses, hepatosplenomegaly or hernias noted. Normal bowel sounds, without guarding, and without rebound.     Msk:  Symmetrical without gross deformities. Normal posture. Pulses:  Normal pulses noted. Extremities:  Without clubbing or edema. Neurologic:  Alert and  oriented x4;  grossly normal neurologically. Skin: Pale, otherwise Intact without significant lesions or rashes. Psych:  Alert and cooperative.  Normal mood and affect.   Lab Results: Recent Labs    02/26/23 1437 02/28/23 1033 03/01/23 0726  WBC 5.5 5.1 6.8  HGB 7.1 Repeated and verified X2.* 6.4* 9.0*  HCT 26.3 Repeated and verified X2.* 24.9* 31.6*  PLT 281.0 263 245   BMET Recent Labs    02/26/23 1437 02/28/23 1033 03/01/23 0726  NA 139 132* 137  K 4.4 3.7 4.2  CL 106 102 105  CO2 25 22 26   GLUCOSE 99 119* 115*  BUN 15 13 9   CREATININE 1.04 0.74 0.82  CALCIUM 9.2 8.5* 8.9   LFT Recent Labs    02/28/23 1033  PROT 6.5  ALBUMIN 3.4*  AST 19  ALT 13  ALKPHOS 50  BILITOT 0.3   PT/INR No results for input(s): "LABPROT", "INR" in the last 72 hours.  Studies/Results: No results found.  Impression:   Dysphagia x months. History of Barrett's esophagus with high-grade dysplasia, prior endoscopic mucosal resection in 2022 with Dr. Meridee Score.  Patient did not follow-up for any further care. New onset microcytic anemia. History of duodenal ulcer and post-bulbar duodenal stricture. Ongoing NSAID use.  Plan:   Endoscopy tomorrow for further evaluation.  If no clear explanation for her anemia identified, could consider colonoscopy this admission. Clear liquids, NPO after midnight. Risks (bleeding, infection, bowel perforation that could require surgery, sedation-related changes in cardiopulmonary systems), benefits (identification and possible treatment of source of symptoms, exclusion of certain causes of symptoms), and alternatives (watchful waiting, radiographic imaging studies, empiric medical treatment) of upper endoscopy (EGD) were explained to patient/family in detail and patient wishes to proceed.  PPI. No NSAIDs as clinically feasible. [facility] will follow.   LOS: 0 days   Carnesha Maravilla M  03/01/2023, 12:11 PM  Cell [phone] If no answer or after 5 PM call [phone]

## 2023-03-01 NOTE — Progress Notes (Signed)
   03/01/23 1100  TOC Brief Assessment  Insurance and Status Reviewed  Patient has primary care physician Yes  Home environment has been reviewed Resides at home alone  Prior level of function: Independent at baseline  Prior/Current Home Services No current home services  Social Determinants of Health Reivew SDOH reviewed no interventions necessary  Readmission risk has been reviewed Yes  Transition of care needs no transition of care needs at this time

## 2023-03-01 NOTE — Plan of Care (Signed)

## 2023-03-01 NOTE — H&P (View-Only) (Signed)
[facility] Consultation Note  Referring Provider: [facility] Primary Care Physician:  Estevan Oaks, NP  Reason for Consultation:  anemia  HPI: Grace [name] is a 67 y.o. female admitted with shortness of breath in setting of new onset anemia.  Has history of Barrett's esophagus with at least high grade dysplasia; I did endoscopy in 2021 and referred to Dr. Meridee Score for EMR which was done in 2022 with plans for follow-up RFA but patient did not follow-up with me or Dr. Meridee Score.  Had colonoscopy 2021 showing few small polyps as well.  She has had no abdominal pain, change in bowel habits, hematemesis, overt blood in stool, unintentional weight loss.  Has had some dysphagia for the past several months.   Past Medical History:  Diagnosis Date   Abnormal vaginal bleeding    Anxiety    Asthma    Barrett's esophagus    with high grade dysplasia per endoscopy 05/2005 // followed by Dr. Stan Head ([facility])   Chronic back pain    COPD (chronic obstructive pulmonary disease) (HCC)    Diabetes mellitus without complication (HCC)    type 2   Duodenal ulcer disease    thought to be contributed at least partly by overuse of NSAIDs and salicylates (goody powdr)   Endometrial polyp 03/2003   s/p resection in 03/2003. Path showing submucosal leiomyoma and benign  proliferative type endometrium   Family history of premature CAD 05/19/2018   Fibroid uterus    s/p myomectomy x 2   GERD (gastroesophageal reflux disease)    H/O failed moderate sedation    Hiatal hernia    History of CVA (cerebrovascular accident) 10/2000   History of pineal cyst    11 mm cystic mass noted in the pineal gland per MRI in 2001 - most consitent with simple pineal cyst   HSV infection    Hyperlipidemia    Hypertension    Hypothyroidism    with hx of multinodular goiter (noted on neck US in 11/2002 - showing diffuse nodularity and inhomogenous texture diffusely BL)   Internal hemorrhoids     grade 2 per colonoscopy in 09/2006 - repeat colonoscopy rec in 5-10 years.   Migraine    Nevus    Personal history of failed conscious sedation 08/11/2011   Sleep apnea    does not use cpap   Stroke (HCC) 10/2000   Tachycardia    Thyroid disease    Tobacco abuse Jan 2014   quit    Past Surgical History:  Procedure Laterality Date   ADENOIDECTOMY     APPENDECTOMY  2017   BALLOON DILATION N/A 06/14/2020   Procedure: BALLOON DILATION;  Surgeon: Meridee Score Netty Starring., MD;  Location: [facility] ENDOSCOPY;  Service: Gastroenterology;  Laterality: N/A;   BIOPSY  02/15/2020   Procedure: BIOPSY;  Surgeon: Willis Modena, MD;  Location: [facility] ENDOSCOPY;  Service: Endoscopy;;   BIOPSY  06/14/2020   Procedure: BIOPSY;  Surgeon: Lemar Lofty., MD;  Location: [facility] ENDOSCOPY;  Service: Gastroenterology;;   BRONCHIAL BIOPSY  03/15/2020   Procedure: BRONCHIAL BIOPSIES;  Surgeon: Josephine Igo, DO;  Location: [facility] ENDOSCOPY;  Service: Pulmonary;;   BRONCHIAL BRUSHINGS  03/15/2020   Procedure: BRONCHIAL BRUSHINGS;  Surgeon: Josephine Igo, DO;  Location: [facility] ENDOSCOPY;  Service: Pulmonary;;   BRONCHIAL NEEDLE ASPIRATION BIOPSY  03/15/2020   Procedure: BRONCHIAL NEEDLE ASPIRATION BIOPSIES;  Surgeon: Josephine Igo, DO;  Location: [facility] ENDOSCOPY;  Service: Pulmonary;;   BRONCHIAL WASHINGS  03/15/2020  Procedure: BRONCHIAL WASHINGS;  Surgeon: Josephine Igo, DO;  Location: [facility] ENDOSCOPY;  Service: Pulmonary;;   CHOLECYSTECTOMY     COLONOSCOPY  02/07/2010   COLONOSCOPY WITH PROPOFOL N/A 02/15/2020   Procedure: COLONOSCOPY WITH PROPOFOL;  Surgeon: Willis Modena, MD;  Location: [facility] ENDOSCOPY;  Service: Endoscopy;  Laterality: N/A;   DILATION AND CURETTAGE OF UTERUS     ENDOSCOPIC MUCOSAL RESECTION N/A 06/14/2020   Procedure: ENDOSCOPIC MUCOSAL RESECTION;  Surgeon: Meridee Score Netty Starring., MD;  Location: [facility] ENDOSCOPY;  Service: Gastroenterology;  Laterality: N/A;   ESOPHAGOGASTRODUODENOSCOPY (EGD) WITH PROPOFOL  N/A 02/15/2020   Procedure: ESOPHAGOGASTRODUODENOSCOPY (EGD) WITH PROPOFOL;  Surgeon: Willis Modena, MD;  Location: [facility] ENDOSCOPY;  Service: Endoscopy;  Laterality: N/A;   ESOPHAGOGASTRODUODENOSCOPY (EGD) WITH PROPOFOL N/A 06/14/2020   Procedure: ESOPHAGOGASTRODUODENOSCOPY (EGD) WITH PROPOFOL;  Surgeon: Meridee Score Netty Starring., MD;  Location: [facility] ENDOSCOPY;  Service: Gastroenterology;  Laterality: N/A;   FOREIGN BODY REMOVAL  06/14/2020   Procedure: FOREIGN BODY REMOVAL;  Surgeon: Meridee Score, Netty Starring., MD;  Location: [facility] ENDOSCOPY;  Service: Gastroenterology;;   HYSTEROSCOPY     LAPAROSCOPIC APPENDECTOMY N/A 09/23/2015   Procedure: APPENDECTOMY LAPAROSCOPIC;  Surgeon: Violeta Gelinas, MD;  Location: [facility] OR;  Service: General;  Laterality: N/A;   MYOMECTOMY  1990, 1997   x 2 - In 1997, noted to have extensive pelvic adhesions and BL tubal obstruction   POLYPECTOMY  02/15/2020   Procedure: POLYPECTOMY;  Surgeon: Willis Modena, MD;  Location: [facility] ENDOSCOPY;  Service: Endoscopy;;   TONSILLECTOMY     TRANSTHORACIC ECHOCARDIOGRAM  06/2014    EF 55-60%.  No RWMA. Gr 1 DD. Cannot R/o PFO (consider bubble study).   UPPER ESOPHAGEAL ENDOSCOPIC ULTRASOUND (EUS) N/A 06/14/2020   Procedure: UPPER ESOPHAGEAL ENDOSCOPIC ULTRASOUND (EUS);  Surgeon: Lemar Lofty., MD;  Location: [facility] ENDOSCOPY;  Service: Gastroenterology;  Laterality: N/A;   UPPER GASTROINTESTINAL ENDOSCOPY  12/18/2009   VIDEO BRONCHOSCOPY WITH ENDOBRONCHIAL NAVIGATION N/A 03/15/2020   Procedure: VIDEO BRONCHOSCOPY WITH ENDOBRONCHIAL NAVIGATION;  Surgeon: Josephine Igo, DO;  Location: [facility] ENDOSCOPY;  Service: Pulmonary;  Laterality: N/A;   VIDEO BRONCHOSCOPY WITH ENDOBRONCHIAL ULTRASOUND  03/15/2020   Procedure: VIDEO BRONCHOSCOPY WITH ENDOBRONCHIAL ULTRASOUND;  Surgeon: Josephine Igo, DO;  Location: [facility] ENDOSCOPY;  Service: Pulmonary;;   WISDOM TOOTH EXTRACTION      Prior to Admission medications   Medication Sig Start Date End Date  Taking? Authorizing Provider  albuterol (PROAIR HFA) 108 (90 Base) MCG/ACT inhaler INHALE 2 PUFFS EVERY 6  HOURS AS NEEDED FOR  SHORTNESS OF BREATH OR  WHEEZING Patient taking differently: Inhale 2 puffs into the lungs every 6 (six) hours as needed for wheezing. 05/17/19  Yes Ginelle Seller, NP  Aspirin-Acetaminophen (GOODYS BODY PAIN PO) Take 1 Package by mouth daily as needed (pain/headache).   Yes [provider]  bisoprolol (ZEBETA) 10 MG tablet Take 10 mg by mouth daily. 04/19/20  Yes [provider]  Budeson-Glycopyrrol-Formoterol (BREZTRI AEROSPHERE) 160-9-4.8 MCG/ACT AERO Inhale 2 puffs into the lungs in the morning and at bedtime. 02/26/23  Yes Cobb, Ruby Cola, NP  calcium carbonate (TUMS - DOSED IN MG ELEMENTAL CALCIUM) 500 MG chewable tablet Chew 1 tablet by mouth daily as needed for indigestion or heartburn.   Yes [provider]  COLLAGEN-VITAMIN C PO Take 3 tablets by mouth in the morning and at bedtime.   Yes [provider]  estradiol (ESTRACE VAGINAL) 0.1 MG/GM vaginal cream Place 1 Applicatorful vaginally at bedtime. Patient taking differently: Place 1 Applicatorful  vaginally 2 (two) times a week. 08/19/19  Yes Tisa Seller, NP  ezetimibe (ZETIA) 10 MG tablet Take 10 mg by mouth daily. 09/29/22  Yes [provider]  fluticasone (FLONASE) 50 MCG/ACT nasal spray Place 2 sprays into both nostrils daily. 01/09/23  Yes Cobb, Ruby Cola, NP  furosemide (LASIX) 40 MG tablet Take 1 tab daily as needed for weight gain of 3 lb over night or 5 lb in 1 week. Patient taking differently: Take 40 mg by mouth See admin instructions. Take 1 tab daily as needed for weight gain of 3 lb over night or 5 lb in 1 week. 12/31/22  Yes Monge, Petra Kuba, NP  glipiZIDE (GLUCOTROL XL) 5 MG 24 hr tablet TAKE 1 TABLET BY MOUTH  DAILY WITH BREAKFAST Patient taking differently: Take 5 mg by mouth every evening. 09/05/19  Yes Eubanks, Janene Harvey, NP  guaiFENesin (MUCINEX)  600 MG 12 hr tablet Take 600 mg by mouth 2 (two) times daily as needed for to loosen phlegm or cough.   Yes [provider]  hydrALAZINE (APRESOLINE) 50 MG tablet TAKE 1 TABLET BY MOUTH 3  TIMES DAILY Patient taking differently: Take 50 mg by mouth 3 (three) times daily. 07/25/19  Yes Zamaria Seller, NP  ibuprofen (ADVIL) 200 MG tablet Take 800 mg by mouth every 8 (eight) hours as needed for moderate pain.   Yes [provider]  ipratropium-albuterol (DUONEB) 0.5-2.5 (3) MG/3ML SOLN Take 3 mLs by nebulization every 6 (six) hours as needed. 02/07/19  Yes Luciano Cutter, MD  isosorbide mononitrate (IMDUR) 30 MG 24 hr tablet Take 2 tablets (60 mg total) by mouth daily. 01/15/23  Yes Monge, Petra Kuba, NP  levothyroxine (SYNTHROID) 200 MCG tablet Take 200 mcg by mouth at bedtime.   Yes [provider]  losartan (COZAAR) 100 MG tablet TAKE 1 TABLET BY MOUTH  DAILY Patient taking differently: Take 100 mg by mouth every evening. 07/20/19  Yes Cady Seller, NP  mirabegron ER (MYRBETRIQ) 50 MG TB24 tablet Take 50 mg by mouth daily.   Yes [provider]  omeprazole (PRILOSEC) 40 MG capsule Take 1 capsule (40 mg total) by mouth 2 (two) times daily before a meal. Take 30- 60 min before your first and last meals of the day 06/14/20  Yes Mansouraty, Netty Starring., MD  pregabalin (LYRICA) 50 MG capsule Take 50 mg by mouth daily. 10/13/22  Yes [provider]  REPATHA SURECLICK 140 MG/ML SOAJ INJECT 1 PEN SUBCUTANEOUSLY  EVERY 2 WEEKS Patient taking differently: Inject 140 mg into the skin 2 (two) times a week. 02/20/23  Yes Marykay Lex, MD  valACYclovir (VALTREX) 500 MG tablet TAKE 1 TABLET BY MOUTH  DAILY Patient taking differently: Take 500 mg by mouth daily. 11/23/19  Yes Christionna Seller, NP  glucose blood (ONETOUCH VERIO) test strip Use to check blood sugar twice daily. Dx: E11.9 08/25/18   Yolandra Seller, NP  Lancets (ONETOUCH DELICA PLUS Collier Bullock) MISC  USE TO CHECK BLOOD SUGAR  TWICE DAILY 04/01/19   Edlin Seller, NP  predniSONE (DELTASONE) 10 MG tablet 4 tabs for 3 days, then 3 tabs for 3 days, 2 tabs for 3 days, then 1 tab for 3 days, then stop Patient not taking: Reported on 02/28/2023 01/09/23   Noemi Chapel, NP  Respiratory Therapy Supplies (NEBULIZER/TUBING/MOUTHPIECE) KIT 1 application by Does not apply route every 6 (six) hours as needed. FAX TO [phone] 07/19/19   Janyth Contes,  Janene Harvey, NP  TRELEGY ELLIPTA 200-62.5-25 MCG/ACT AEPB Inhale 1 puff into the lungs daily. Patient not taking: Reported on 02/28/2023 12/11/22   [provider]  ipratropium (ATROVENT HFA) 17 MCG/ACT inhaler Inhale 2 puffs into the lungs 4 (four) times daily. 04/25/11 06/30/11  Danley Danker, MD    Current Facility-Administered Medications  Medication Dose Route Frequency Provider Last Rate Last Admin   0.9 %  sodium chloride infusion (Manually program via Guardrails IV Fluids)   Intravenous Once Regalado, Belkys A, MD   Held at 02/28/23 1215   0.9 %  sodium chloride infusion  250 mL Intravenous PRN Regalado, Belkys A, MD       acetaminophen (TYLENOL) tablet 650 mg  650 mg Oral Q6H PRN Regalado, Belkys A, MD   650 mg at 03/01/23 0254   Or   acetaminophen (TYLENOL) suppository 650 mg  650 mg Rectal Q6H PRN Regalado, Belkys A, MD       albuterol (PROVENTIL) (2.5 MG/3ML) 0.083% nebulizer solution 2.5 mg  2.5 mg Nebulization Q2H PRN Regalado, Belkys A, MD       bisacodyl (DULCOLAX) EC tablet 5 mg  5 mg Oral Daily PRN Regalado, Belkys A, MD       bisoprolol (ZEBETA) tablet 10 mg  10 mg Oral Daily Regalado, Belkys A, MD   10 mg at 03/01/23 0843   ezetimibe (ZETIA) tablet 10 mg  10 mg Oral Daily Regalado, Belkys A, MD   10 mg at 03/01/23 0843   fluticasone (FLONASE) 50 MCG/ACT nasal spray 2 spray  2 spray Each Nare Daily Regalado, Belkys A, MD   2 spray at 03/01/23 0844   guaiFENesin (MUCINEX) 12 hr tablet 600 mg  600 mg Oral BID PRN Regalado,  Belkys A, MD   600 mg at 03/01/23 0843   HYDROcodone-acetaminophen (NORCO/VICODIN) 5-325 MG per tablet 1-2 tablet  1-2 tablet Oral Q4H PRN Regalado, Belkys A, MD   1 tablet at 03/01/23 0736   insulin aspart (novoLOG) injection 0-6 Units  0-6 Units Subcutaneous TID WC Regalado, Belkys A, MD       isosorbide mononitrate (IMDUR) 24 hr tablet 60 mg  60 mg Oral Daily Regalado, Belkys A, MD   60 mg at 03/01/23 0843   levothyroxine (SYNTHROID) tablet 200 mcg  200 mcg Oral Q0600 Regalado, Belkys A, MD   200 mcg at 03/01/23 0510   mometasone-formoterol (DULERA) 100-5 MCG/ACT inhaler 2 puff  2 puff Inhalation BID Regalado, Belkys A, MD   2 puff at 03/01/23 0857   And   umeclidinium bromide (INCRUSE ELLIPTA) 62.5 MCG/ACT 1 puff  1 puff Inhalation Daily Regalado, Belkys A, MD   1 puff at 03/01/23 0857   ondansetron (ZOFRAN) tablet 4 mg  4 mg Oral Q6H PRN Regalado, Belkys A, MD       Or   ondansetron (ZOFRAN) injection 4 mg  4 mg Intravenous Q6H PRN Regalado, Belkys A, MD       Oral care mouth rinse  15 mL Mouth Rinse PRN Regalado, Belkys A, MD       pantoprazole (PROTONIX) injection 40 mg  40 mg Intravenous Q12H Regalado, Belkys A, MD   40 mg at 03/01/23 0843   pregabalin (LYRICA) capsule 50 mg  50 mg Oral Daily Regalado, Belkys A, MD   50 mg at 03/01/23 0843   sodium chloride flush (NS) 0.9 % injection 3 mL  3 mL Intravenous Q12H Regalado, Belkys A, MD   3 mL at  03/01/23 0844   sodium chloride flush (NS) 0.9 % injection 3 mL  3 mL Intravenous PRN Regalado, Belkys A, MD       valACYclovir (VALTREX) tablet 500 mg  500 mg Oral Daily Regalado, Belkys A, MD   500 mg at 03/01/23 0843    Allergies as of 02/28/2023 - Review Complete 02/28/2023  Allergen Reaction Noted   Amlodipine  07/19/2018   Codeine     Gabapentin  01/22/2018   Prozac [fluoxetine hcl] Other (See Comments) 01/12/2015    Family History  Problem Relation Age of Onset   COPD Mother    Heart disease Father 35       had 5 heart attacks,  first in late 62s   COPD Father    Colon cancer Neg Hx    Esophageal cancer Neg Hx    Inflammatory bowel disease Neg Hx    Liver disease Neg Hx    Pancreatic cancer Neg Hx    Rectal cancer Neg Hx    Stomach cancer Neg Hx    Thyroid disease Neg Hx     Social History   Socioeconomic History   Marital status: Divorced    Spouse name: Not on file   Number of children: 0   Years of education: 12   Highest education level: High school graduate  Occupational History   Occupation: Electronic record    Comment: [employer]  Tobacco Use   Smoking status: Former    Current packs/day: 0.00    Average packs/day: 3.0 packs/day for 50.0 years (150.0 ttl pk-yrs)    Types: Cigarettes    Start date: 09/22/1965    Quit date: 09/23/2015    Years since quitting: 7.4   Smokeless tobacco: Never   Tobacco comments:    Counseling sheet 07-2011   Vaping Use   Vaping status: Never Used  Substance and Sexual Activity   Alcohol use: No    Alcohol/week: 0.0 standard drinks of alcohol   Drug use: No   Sexual activity: Never    Birth control/protection: Post-menopausal  Other Topics Concern   Not on file  Social History Narrative   Financial assistance approved for 100% discount at [facility] and has [facility] card per [employer]   01/15/2010      Diet- N/A   Caffeine- Goody powders   Married-Divorced   [employer] and lives with mother   Pets-2   Current/past profession-[employer]   Exercise-No   Living will-No   DNR-No   POA/HPOA-No         Social Determinants of Health   Financial Resource Strain: Medium Risk (02/23/2018)   Overall Financial Resource Strain (CARDIA)    Difficulty of Paying Living Expenses: Somewhat hard  Food Insecurity: No Food Insecurity (02/28/2023)   Hunger Vital Sign    Worried About Running Out of Food in the Last Year: Never true    Ran Out of Food in the Last Year: Never true  Transportation Needs: No Transportation Needs (02/28/2023)   PRAPARE - Scientist, research (physical sciences) (Medical): No    Lack of Transportation (Non-Medical): No  Physical Activity: Inactive (02/23/2018)   Exercise Vital Sign    Days of Exercise per Week: 0 days    Minutes of Exercise per Session: 0 min  Stress: Stress Concern Present (02/23/2018)   [employer] of Occupational Health - Occupational Stress Questionnaire    Feeling of Stress : Rather much  Social Connections: Unknown (09/02/2022)   [facility], [facility]   Social Network    Social Network: Not on file  Intimate Partner Violence: Not At Risk (02/28/2023)   Humiliation, Afraid, Rape, and Kick questionnaire    Fear of Current or Ex-Partner: No    Emotionally Abused: No    Physically Abused: No    Sexually Abused: No    Review of Systems: As per HPI, all others negative  Physical Exam: Vital signs in last 24 hours: Temp:  [97.9 F (36.6 C)-98.5 F (36.9 C)] 98.3 F (36.8 C) (11/17 0333) Pulse Rate:  [69-82] 72 (11/17 0333) Resp:  [14-18] 14 (11/17 0333) BP: (128-161)/(63-95) 139/63 (11/17 0333) SpO2:  [93 %-99 %] 93 % (11/17 0857) Last BM Date : 02/27/23 General:   Alert,  Well-developed, well-nourished, pleasant and cooperative in NAD Head:  Normocephalic and atraumatic. Eyes:  Sclera clear, no icterus.   Conjunctiva pale Ears:  Normal auditory acuity. Nose:  No deformity, discharge,  or lesions. Mouth:  No deformity or lesions.  Oropharynx pale and dry Neck:  Supple; no masses or thyromegaly. Lungs:  No visible distress Abdomen:  Soft, nontender and nondistended. No masses, hepatosplenomegaly or hernias noted. Normal bowel sounds, without guarding, and without rebound.     Msk:  Symmetrical without gross deformities. Normal posture. Pulses:  Normal pulses noted. Extremities:  Without clubbing or edema. Neurologic:  Alert and  oriented x4;  grossly normal neurologically. Skin: Pale, otherwise Intact without significant lesions or rashes. Psych:  Alert and cooperative.  Normal mood and affect.   Lab Results: Recent Labs    02/26/23 1437 02/28/23 1033 03/01/23 0726  WBC 5.5 5.1 6.8  HGB 7.1 Repeated and verified X2.* 6.4* 9.0*  HCT 26.3 Repeated and verified X2.* 24.9* 31.6*  PLT 281.0 263 245   BMET Recent Labs    02/26/23 1437 02/28/23 1033 03/01/23 0726  NA 139 132* 137  K 4.4 3.7 4.2  CL 106 102 105  CO2 25 22 26   GLUCOSE 99 119* 115*  BUN 15 13 9   CREATININE 1.04 0.74 0.82  CALCIUM 9.2 8.5* 8.9   LFT Recent Labs    02/28/23 1033  PROT 6.5  ALBUMIN 3.4*  AST 19  ALT 13  ALKPHOS 50  BILITOT 0.3   PT/INR No results for input(s): "LABPROT", "INR" in the last 72 hours.  Studies/Results: No results found.  Impression:   Dysphagia x months. History of Barrett's esophagus with high-grade dysplasia, prior endoscopic mucosal resection in 2022 with Dr. Meridee Score.  Patient did not follow-up for any further care. New onset microcytic anemia. History of duodenal ulcer and post-bulbar duodenal stricture. Ongoing NSAID use.  Plan:   Endoscopy tomorrow for further evaluation.  If no clear explanation for her anemia identified, could consider colonoscopy this admission. Clear liquids, NPO after midnight. Risks (bleeding, infection, bowel perforation that could require surgery, sedation-related changes in cardiopulmonary systems), benefits (identification and possible treatment of source of symptoms, exclusion of certain causes of symptoms), and alternatives (watchful waiting, radiographic imaging studies, empiric medical treatment) of upper endoscopy (EGD) were explained to patient/family in detail and patient wishes to proceed.  PPI. No NSAIDs as clinically feasible. [facility] will follow.   LOS: 0 days   Carnesha Maravilla M  03/01/2023, 12:11 PM  Cell [phone] If no answer or after 5 PM call [phone]

## 2023-03-01 NOTE — Plan of Care (Signed)

## 2023-03-02 ENCOUNTER — Inpatient Hospital Stay (HOSPITAL_COMMUNITY): Payer: 59 | Admitting: Anesthesiology

## 2023-03-02 ENCOUNTER — Encounter (HOSPITAL_COMMUNITY)
Admission: EM | Disposition: A | Discharge status: Home / Self Care | Payer: Self-pay | Source: Home / Self Care | Attending: Internal Medicine

## 2023-03-02 ENCOUNTER — Encounter (HOSPITAL_COMMUNITY): Payer: Self-pay | Admitting: Internal Medicine

## 2023-03-02 DIAGNOSIS — D509 Iron deficiency anemia, unspecified: Secondary | ICD-10-CM | POA: Diagnosis not present

## 2023-03-02 DIAGNOSIS — D649 Anemia, unspecified: Secondary | ICD-10-CM | POA: Diagnosis not present

## 2023-03-02 DIAGNOSIS — E119 Type 2 diabetes mellitus without complications: Secondary | ICD-10-CM

## 2023-03-02 DIAGNOSIS — K2270 Barrett's esophagus without dysplasia: Secondary | ICD-10-CM

## 2023-03-02 HISTORY — PX: ESOPHAGOGASTRODUODENOSCOPY (EGD) WITH PROPOFOL: SHX5813

## 2023-03-02 HISTORY — PX: BIOPSY: SHX5522

## 2023-03-02 LAB — BASIC METABOLIC PANEL
Anion gap: 6 (ref 5–15)
BUN: 7 mg/dL — ABNORMAL LOW (ref 8–23)
CO2: 25 mmol/L (ref 22–32)
Calcium: 9.5 mg/dL (ref 8.9–10.3)
Chloride: 105 mmol/L (ref 98–111)
Creatinine, Ser: 0.73 mg/dL (ref 0.44–1.00)
GFR, Estimated: >60 mL/min (ref >60)
Glucose, Bld: 104 mg/dL — ABNORMAL HIGH (ref 70–99)
Potassium: 4.1 mmol/L (ref 3.5–5.1)
Sodium: 136 mmol/L (ref 135–145)

## 2023-03-02 LAB — CBC
HCT: 33.7 % — ABNORMAL LOW (ref 36.0–46.0)
Hemoglobin: 9.5 g/dL — ABNORMAL LOW (ref 12.0–15.0)
MCH: 21.6 pg — ABNORMAL LOW (ref 26.0–34.0)
MCHC: 28.2 g/dL — ABNORMAL LOW (ref 30.0–36.0)
MCV: 76.6 fL — ABNORMAL LOW (ref 80.0–100.0)
Platelets: 261 10*3/uL (ref 150–400)
RBC: 4.4 MIL/uL (ref 3.87–5.11)
RDW: 21.5 % — ABNORMAL HIGH (ref 11.5–15.5)
WBC: 5.7 10*3/uL (ref 4.0–10.5)
nRBC: 0 % (ref 0.0–0.2)

## 2023-03-02 LAB — TYPE AND SCREEN
ABO/RH(D): O POS
Antibody Screen: NEGATIVE
Unit division: 0
Unit division: 0

## 2023-03-02 LAB — BPAM RBC
Blood Product Expiration Date: 202411302359
Blood Product Expiration Date: 202412052359
ISSUE DATE / TIME: 202411161409
ISSUE DATE / TIME: 202411161619
Unit Type and Rh: 5100
Unit Type and Rh: 5100

## 2023-03-02 LAB — GLUCOSE, CAPILLARY
Glucose-Capillary: 86 mg/dL (ref 70–99)
Glucose-Capillary: 122 mg/dL — ABNORMAL HIGH (ref 70–99)
Glucose-Capillary: 102 mg/dL — ABNORMAL HIGH (ref 70–99)
Glucose-Capillary: 145 mg/dL — ABNORMAL HIGH (ref 70–99)

## 2023-03-02 SURGERY — ESOPHAGOGASTRODUODENOSCOPY (EGD) WITH PROPOFOL
Anesthesia: General | Laterality: Left

## 2023-03-02 MED ORDER — LIDOCAINE HCL 1 % IJ SOLN
INTRAMUSCULAR | Status: DC | PRN
  Administered 2023-03-02: 60 mg via INTRADERMAL

## 2023-03-02 MED ORDER — PROPOFOL 10 MG/ML IV BOLUS
INTRAVENOUS | Status: DC | PRN
  Administered 2023-03-02 (×3): 20 mg via INTRAVENOUS

## 2023-03-02 MED ORDER — PROPOFOL 500 MG/50ML IV EMUL
INTRAVENOUS | Status: AC
  Filled 2023-03-02: qty 50

## 2023-03-02 MED ORDER — SODIUM CHLORIDE 0.9 % IV SOLN: INTRAVENOUS | Status: DC | PRN

## 2023-03-02 MED ORDER — PROPOFOL 500 MG/50ML IV EMUL
INTRAVENOUS | Status: DC | PRN
  Administered 2023-03-02: 130 ug/kg/min via INTRAVENOUS

## 2023-03-02 SURGICAL SUPPLY — 14 items
BLOCK BITE 60FR ADLT L/F BLUE (MISCELLANEOUS) ×3 IMPLANT
ELECT REM PT RETURN 9FT ADLT (ELECTROSURGICAL)
ELECTRODE REM PT RTRN 9FT ADLT (ELECTROSURGICAL) IMPLANT
FORCEP RJ3 GP 1.8X160 W-NEEDLE (CUTTING FORCEPS) IMPLANT
FORCEPS BIOP RAD 4 LRG CAP 4 (CUTTING FORCEPS) IMPLANT
NDL SCLEROTHERAPY 25GX240 (NEEDLE) IMPLANT
NEEDLE SCLEROTHERAPY 25GX240 (NEEDLE) IMPLANT
PROBE APC STR FIRE (PROBE) IMPLANT
PROBE INJECTION GOLD 7FR (MISCELLANEOUS) IMPLANT
SNARE SHORT THROW 13M SML OVAL (MISCELLANEOUS) IMPLANT
SYR 50ML LL SCALE MARK (SYRINGE) IMPLANT
TUBING ENDO SMARTCAP PENTAX (MISCELLANEOUS) ×6 IMPLANT
TUBING IRRIGATION ENDOGATOR (MISCELLANEOUS) ×3 IMPLANT
WATER STERILE IRR 1000ML POUR (IV SOLUTION) IMPLANT

## 2023-03-02 NOTE — Anesthesia Preprocedure Evaluation (Signed)
Anesthesia Evaluation  Patient identified by MRN, date of birth, ID band Patient awake    Reviewed: Allergy & Precautions, NPO status , Patient's Chart, lab work & pertinent test results  Airway Mallampati: II  TM Distance: >3 FB Neck ROM: Full    Dental  (+) Poor Dentition, Missing   Pulmonary asthma , sleep apnea , COPD,  COPD inhaler, former smoker   Pulmonary exam normal        Cardiovascular hypertension, Pt. on medications and Pt. on home beta blockers + CAD  Normal cardiovascular exam Rhythm:Regular Rate:Normal     Neuro/Psych  Headaches  Anxiety     CVA (07/02), No Residual Symptoms    GI/Hepatic Neg liver ROS, hiatal hernia, PUD,GERD  Medicated,,Duodenal stricture, Barrett's   Endo/Other  diabetes, Type 2, Oral Hypoglycemic AgentsHypothyroidism    Renal/GU negative Renal ROS  negative genitourinary   Musculoskeletal negative musculoskeletal ROS (+)    Abdominal  (+) + obese  Peds  Hematology negative hematology ROS (+)   Anesthesia Other Findings   Reproductive/Obstetrics                             Anesthesia Physical Anesthesia Plan  ASA: III  Anesthesia Plan: General   Post-op Pain Management:    Induction:   PONV Risk Score and Plan: 2 and Ondansetron and Propofol infusion  Airway Management Planned: Mask and Natural Airway  Additional Equipment: None  Intra-op Plan:   Post-operative Plan:   Informed Consent: I have reviewed the patients History and Physical, chart, labs and discussed the procedure including the risks, benefits and alternatives for the proposed anesthesia with the patient or authorized representative who has indicated his/her understanding and acceptance.     Dental advisory given  Plan Discussed with: CRNA  Anesthesia Plan Comments: (         )        Anesthesia Quick Evaluation

## 2023-03-02 NOTE — Op Note (Signed)
[facility] Patient Name: Grace [name] Procedure Date: 03/02/2023 MRN: [id number] Attending MD: Kerin Salen , MD, [id number] Date of Birth: 09-24-1955 CSN: [id number] Age: 67 Admit Type: Inpatient Procedure:                Upper GI endoscopy Indications:              Unexplained iron deficiency anemia, History of                            Barrett's esophagus with high-grade dysplasia                            status post EMR, history of duodenal stricture Providers:                Kerin Salen, MD, Marge Duncans, RN, Rozetta Nunnery, Technician, Derinda Late, Technician,                            Marlena Clipper, CRNA Referring MD:             [facility] Medicines:                Monitored Anesthesia Care Complications:            No immediate complications. Estimated Blood Loss:     Estimated blood loss was minimal. Procedure:                Pre-Anesthesia Assessment:                           - Prior to the procedure, a History and Physical                            was performed, and patient medications and                            allergies were reviewed. The patient's tolerance of                            previous anesthesia was also reviewed. The risks                            and benefits of the procedure and the sedation                            options and risks were discussed with the patient.                            All questions were answered, and informed consent                            was obtained. Prior Anticoagulants: The patient has  taken no anticoagulant or antiplatelet agents. ASA                            Grade Assessment: III - A patient with severe                            systemic disease. After reviewing the risks and                            benefits, the patient was deemed in satisfactory                            condition to undergo the procedure.                            After obtaining informed consent, the endoscope was                            passed under direct vision. Throughout the                            procedure, the patient's blood pressure, pulse, and                            oxygen saturations were monitored continuously. The                            [id number]-H190 ([id number] Olympus endoscope was introduced                            through the mouth, and advanced to the second part                            of duodenum. The upper GI endoscopy was                            accomplished without difficulty. The patient                            tolerated the procedure well. Scope In: Scope Out: Findings:      There were esophageal mucosal changes secondary to established       short-segment Barrett's disease present in the lower third of the       esophagus. The maximum longitudinal extent of these mucosal changes was       1 cm in length. Mucosa was biopsied with a cold forceps for histology in       a targeted manner from 39 to 40 cm from the incisors. One specimen       bottle was sent to pathology.      Localized mildly erythematous mucosa without bleeding was found in the       gastric antrum. Biopsies were taken with a cold forceps for Helicobacter       pylori testing.      The cardia and gastric fundus were normal on retroflexion.  An acquired benign-appearing, intrinsic severe stenosis was found in the       first portion of the duodenum and was traversed only after exchanging       adult gastroscope(9.2 mm diameter) with an ultrathin gastroscope(5.4 mm       diameter).      Retained food noted in duodenal bulb which interfered with mucosal       visualization. Biopsies for histology were taken with a cold forceps for       evaluation of celiac disease. Impression:               - Esophageal mucosal changes secondary to                            established short-segment Barrett's disease.                             Biopsied.                           - Erythematous mucosa in the antrum. Biopsied.                           - Acquired duodenal stenosis. Biopsied. Moderate Sedation:      Patient did not receive moderate sedation for this procedure, but       instead received monitored anesthesia care. Recommendation:           - Clear liquid diet.                           - Upper GI series with small bowel follow-through                            today Procedure Code(s):        --- Professional ---                           [phone], Esophagogastroduodenoscopy, flexible,                            transoral; with biopsy, single or multiple Diagnosis Code(s):        --- Professional ---                           K22.70, Barrett's esophagus without dysplasia                           K31.89, Other diseases of stomach and duodenum                           K31.5, Obstruction of duodenum                           D50.9, Iron deficiency anemia, unspecified CPT copyright 2022 [employer]. All rights reserved. The codes documented in this report are preliminary and upon coder review may  be revised to meet current compliance requirements. Kerin Salen, MD 03/02/2023 2:03:08 PM This report has been signed electronically. Number of Addenda: 0

## 2023-03-02 NOTE — Transfer of Care (Signed)
Immediate Anesthesia Transfer of Care Note  Patient: Grace [name]  Procedure(s) Performed: ESOPHAGOGASTRODUODENOSCOPY (EGD) WITH PROPOFOL (Left) BIOPSY  Patient Location: PACU and Endoscopy Unit  Anesthesia Type:MAC  Level of Consciousness: awake, alert , oriented, and patient cooperative  Airway & Oxygen Therapy: Patient Spontanous Breathing and Patient connected to face mask oxygen  Post-op Assessment: Report given to RN and Post -op Vital signs reviewed and stable  Post vital signs: Reviewed and stable  Last Vitals:  Vitals Value Taken Time  BP 142/129 03/02/23 1358  Temp    Pulse 88 03/02/23 1401  Resp 18 03/02/23 1401  SpO2 100 % 03/02/23 1401  Vitals shown include unfiled device data.  Last Pain:  Vitals:   03/02/23 1208  TempSrc: Temporal  PainSc: 0-No pain      Patients Stated Pain Goal: 2 (03/01/23 2106)  Complications: No notable events documented.

## 2023-03-02 NOTE — Interval H&P Note (Signed)
History and Physical Interval Note: 67 year old female presented with anemia and dysphagia. She has history of Barrett's esophagus with high-grade dysplasia status post EMR in 2022. She has history of duodenal ulcer and duodenal bulb stricture. She is scheduled for EGD with possible balloon dilation with propofol.  03/02/2023 12:39 PM  Grace [name]  has presented today for EGD with possible dilation with propofol, with the diagnosis of anemia, dysphagia.  The various methods of treatment have been discussed with the patient and family. After consideration of risks, benefits and other options for treatment, the patient has consented to  Procedure(s): ESOPHAGOGASTRODUODENOSCOPY (EGD) WITH PROPOFOL (Left) as a surgical intervention.  The patient's history has been reviewed, patient examined, no change in status, stable for surgery.  I have reviewed the patient's chart and labs.  Questions were answered to the patient's satisfaction.     Kerin Salen

## 2023-03-02 NOTE — Progress Notes (Signed)
PROGRESS NOTE    Grace [name]  ZOX:[id number] DOB: 1955/04/25 DOA: 02/28/2023 PCP: Estevan Oaks, NP   Brief Narrative: Grace [name] is a 67 y.o. female with medical history significant of former smoker, COPD, hypertension, OSA does not use CPAP, GERD, Barrett's esophagus, hypothyroidism, multinodular goiter, diabetes, hyperlipidemia, obesity, had COVID at the end of August presented for follow-up at his pulmonologist office and labs were obtained for fatigue and shortness of breath.  Patient was found to have a hemoglobin of 7.0. She was referred to the ED for further evaluation.  Hemoglobin in the ED at 6. Patient reports shortness of breath on exertion and fatigue for the last couple of weeks.  She has been feeling weak and tired.  She relates shortness of breath even walking from the kitchen to the bathroom.  She has been having some chest pain especially at night.  Her cardiology  increased her Imdur.  Last episode of chest pain was 3 days prior to admission sharp in quality.  Resolve on its own.  She also report a history of on and off diarrhea loose and watery stool.  Her last bowel movement was the day prior to admission with was a formed stool.  She denies melena, hematochezia hematemesis.  2 units of packed red blood cells were ordered on admission.  Patient was admitted for further evaluation.  Underwent endoscopy: showed Barrett esophagus,    Assessment & Plan:   Principal Problem:   Severe anemia  1-Severe anemia, symptomatic History of Barrett Esophagus.  Iron Deficiency Anemia -Patient presented with shortness of breath on exertion, sporadic episodes of chest pain, fatigue weakness -Hemoglobin found to be at 6.7. -Denies melena, hematochezia, hematemesis -No hyperbilirubinemia less likely hemolysis -IV Protonix started.  GI has been consulted. Underwent endoscopy 11/18: Barrett esophagus, mild erythematous mucosa without bleeding was found gastric antrum, biopsy  take, stricture at level of Duodenum.  -Plan for clear liquid diet.  -Upper GI series with Small bowel follow-through.  -S/P  2 units of packed red blood cell 11/16.  -Hold aspirin.  -Received IV iron Hb increase to 9--9.5  2-COPD: Continue with  albuterol and Bestri.    3-DM type 2;  SSI while in hospital.  Hold Glipizide.    4-HTN, CAD;  Resume Bystolic and Imdur.  Hold COzaar for now.    5-History of CVA; hold aspirin    6-Hypothyroidism; resume Synthroid.    7-OSA; Not compliant with CPAP./    Estimated body mass index is 32.24 kg/m as calculated from the following:   Height as of this encounter: 5\' 3"  (1.6 m).   Weight as of this encounter: 82.6 kg.   DVT prophylaxis: SCD Code Status: DNR Family Communication: Care discussed with patient.  Disposition Plan:  Status is: Observation The patient remains OBS appropriate and will d/c before 2 midnights.    Consultants:  Dr Dulce Sellar  Procedures:  none  Antimicrobials:    Subjective: She is doing ok, she understand she needs clear liquid diet.  Denies pain   Objective: Vitals:   03/02/23 1404 03/02/23 1410 03/02/23 1420 03/02/23 1505  BP: 106/83 124/63 (!) 143/71 119/76  Pulse: 87 78 64 73  Resp: (!) 21 15 16 20   Temp:    98.4 F (36.9 C)  TempSrc:    Oral  SpO2: 97% 96% (!) 88% 95%  Weight:      Height:        Intake/Output Summary (Last 24 hours) at 03/02/2023 1641 Last  data filed at 03/02/2023 1500 Gross per 24 hour  Intake 830 ml  Output --  Net 830 ml   Filed Weights   02/28/23 0956  Weight: 82.6 kg    Examination:  General exam: NAD Respiratory system: CTA. Cardiovascular system: S 1, S 2 RRR Gastrointestinal system: BS present, soft, nt Central nervous system: alert Extremities: no edema    Data Reviewed: I have personally reviewed following labs and imaging studies  CBC: Recent Labs  Lab 02/26/23 1437 02/28/23 1033 03/01/23 0726 03/02/23 0728  WBC 5.5 5.1 6.8 5.7   NEUTROABS 3.2 2.9  --   --   HGB 7.1 Repeated and verified X2.* 6.4* 9.0* 9.5*  HCT 26.3 Repeated and verified X2.* 24.9* 31.6* 33.7*  MCV 69.5 Repeated and verified X2.* 75.5* 76.0* 76.6*  PLT 281.0 263 245 261   Basic Metabolic Panel: Recent Labs  Lab 02/26/23 1437 02/28/23 1033 03/01/23 0726 03/02/23 0728  NA 139 132* 137 136  K 4.4 3.7 4.2 4.1  CL 106 102 105 105  CO2 25 22 26 25   GLUCOSE 99 119* 115* 104*  BUN 15 13 9  7*  CREATININE 1.04 0.74 0.82 0.73  CALCIUM 9.2 8.5* 8.9 9.5   GFR: Estimated Creatinine Clearance: 69.5 mL/min (by C-G formula based on SCr of 0.73 mg/dL). Liver Function Tests: Recent Labs  Lab 02/28/23 1033  AST 19  ALT 13  ALKPHOS 50  BILITOT 0.3  PROT 6.5  ALBUMIN 3.4*   No results for input(s): "LIPASE", "AMYLASE" in the last 168 hours. No results for input(s): "AMMONIA" in the last 168 hours. Coagulation Profile: No results for input(s): "INR", "PROTIME" in the last 168 hours. Cardiac Enzymes: No results for input(s): "CKTOTAL", "CKMB", "CKMBINDEX", "TROPONINI" in the last 168 hours. BNP (last 3 results) Recent Labs    02/26/23 1437  PROBNP 234.0*   HbA1C: Recent Labs    02/28/23 1428  HGBA1C 6.9*   CBG: Recent Labs  Lab 03/01/23 1221 03/01/23 1657 03/01/23 2325 03/02/23 0736 03/02/23 1149  GLUCAP 111* 94 89 86 122*   Lipid Profile: No results for input(s): "CHOL", "HDL", "LDLCALC", "TRIG", "CHOLHDL", "LDLDIRECT" in the last 72 hours. Thyroid Function Tests: No results for input(s): "TSH", "T4TOTAL", "FREET4", "T3FREE", "THYROIDAB" in the last 72 hours. Anemia Panel: Recent Labs    02/28/23 1033 02/28/23 1427  VITAMINB12  --  569  FOLATE  --  22.3  FERRITIN  --  3*  TIBC  --  391  IRON  --  15*  RETICCTPCT 1.9  --    Sepsis Labs: No results for input(s): "PROCALCITON", "LATICACIDVEN" in the last 168 hours.  No results found for this or any previous visit (from the past 240 hour(s)).       Radiology  Studies: No results found.      Scheduled Meds:  bisoprolol  10 mg Oral Daily   ezetimibe  10 mg Oral Daily   fluticasone  2 spray Each Nare Daily   insulin aspart  0-6 Units Subcutaneous TID WC   isosorbide mononitrate  60 mg Oral Daily   levothyroxine  200 mcg Oral Q0600   mometasone-formoterol  2 puff Inhalation BID   And   umeclidinium bromide  1 puff Inhalation Daily   pantoprazole (PROTONIX) IV  40 mg Intravenous Q12H   pregabalin  50 mg Oral Daily   sodium chloride flush  3 mL Intravenous Q12H   valACYclovir  500 mg Oral Daily   Continuous Infusions:  LOS: 1 day    Time spent: 35 minutes    Lilianne Delair A Briget Shaheed, MD [facility]   If 7PM-7AM, please contact night-coverage [email or web address]  03/02/2023, 4:41 PM

## 2023-03-02 NOTE — Anesthesia Postprocedure Evaluation (Signed)
Anesthesia Post Note  Patient: Grace [name]  Procedure(s) Performed: ESOPHAGOGASTRODUODENOSCOPY (EGD) WITH PROPOFOL (Left) BIOPSY     Patient location during evaluation: Endoscopy Anesthesia Type: General Level of consciousness: awake Pain management: pain level controlled Vital Signs Assessment: post-procedure vital signs reviewed and stable Respiratory status: spontaneous breathing Cardiovascular status: stable Postop Assessment: no apparent nausea or vomiting Anesthetic complications: no  No notable events documented.  Last Vitals:  Vitals:   03/02/23 1404 03/02/23 1410  BP: 106/83 124/63  Pulse: 87 78  Resp: (!) 21 15  Temp:    SpO2: 97% 96%    Last Pain:  Vitals:   03/02/23 1410  TempSrc:   PainSc: 0-No pain                 Caren Macadam

## 2023-03-03 ENCOUNTER — Inpatient Hospital Stay (HOSPITAL_COMMUNITY): Payer: 59

## 2023-03-03 DIAGNOSIS — D649 Anemia, unspecified: Secondary | ICD-10-CM | POA: Diagnosis not present

## 2023-03-03 LAB — HEMOGLOBIN AND HEMATOCRIT, BLOOD
HCT: 34.8 % — ABNORMAL LOW (ref 36.0–46.0)
Hemoglobin: 9.5 g/dL — ABNORMAL LOW (ref 12.0–15.0)

## 2023-03-03 LAB — GLUCOSE, CAPILLARY
Glucose-Capillary: 101 mg/dL — ABNORMAL HIGH (ref 70–99)
Glucose-Capillary: 116 mg/dL — ABNORMAL HIGH (ref 70–99)

## 2023-03-03 LAB — SURGICAL PATHOLOGY

## 2023-03-03 MED ORDER — TRAMADOL HCL 50 MG PO TABS: 50.0000 mg | ORAL_TABLET | Freq: Four times a day (QID) | ORAL | 0 refills | Status: AC | PRN

## 2023-03-03 MED ORDER — MELATONIN 5 MG PO TABS
5.0000 mg | ORAL_TABLET | Freq: Once | ORAL | Status: AC
  Administered 2023-03-03: 5 mg via ORAL
  Filled 2023-03-03: qty 1

## 2023-03-03 MED ORDER — PANTOPRAZOLE SODIUM 40 MG PO TBEC: 40.0000 mg | DELAYED_RELEASE_TABLET | Freq: Two times a day (BID) | ORAL | 1 refills | Status: DC

## 2023-03-03 MED ORDER — ASPIRIN 81 MG PO TBEC: 81.0000 mg | DELAYED_RELEASE_TABLET | Freq: Every day | ORAL | 0 refills | Status: DC

## 2023-03-03 MED ORDER — POLYSACCHARIDE IRON COMPLEX 150 MG PO CAPS
150.0000 mg | ORAL_CAPSULE | Freq: Every day | ORAL | Status: DC
  Administered 2023-03-03: 150 mg via ORAL
  Filled 2023-03-03: qty 1

## 2023-03-03 MED ORDER — POLYSACCHARIDE IRON COMPLEX 150 MG PO CAPS: 150.0000 mg | ORAL_CAPSULE | Freq: Every day | ORAL | 0 refills | Status: DC

## 2023-03-03 NOTE — TOC Transition Note (Signed)
Transition of Care [facility]) - CM/SW Discharge Note   Patient Details  Name: Grace [name] MRN: [id number] Date of Birth: 31-Jul-1955  [facility] CM/SW Contact:  Beckie Busing, RN Phone Number:[phone]  03/03/2023, 2:48 PM   Clinical Narrative:    Patient with d/c orders. There are no TOC needs noted.    Final next level of care: Home/Self Care Barriers to Discharge: No Barriers Identified   Patient Goals and CMS Choice   Choice offered to / list presented to : NA  Discharge Placement                         Discharge Plan and Services Additional resources added to the After Visit Summary for                  DME Arranged: N/A DME Agency: NA       HH Arranged: NA HH Agency: NA        Social Determinants of Health (SDOH) Interventions SDOH Screenings   Food Insecurity: No Food Insecurity (02/28/2023)  Housing: Low Risk  (02/28/2023)  Transportation Needs: No Transportation Needs (02/28/2023)  Utilities: Not At Risk (02/28/2023)  Depression (PHQ2-9): Low Risk  (04/17/2022)  Financial Resource Strain: Medium Risk (02/23/2018)  Physical Activity: Inactive (02/23/2018)  Social Connections: Unknown (09/02/2022)   Received from [facility], [facility]  Stress: Stress Concern Present (02/23/2018)  Tobacco Use: Medium Risk (03/02/2023)     Readmission Risk Interventions     No data to display

## 2023-03-03 NOTE — Discharge Summary (Addendum)
Physician Discharge Summary   Patient: Grace [name] MRN: [id number] DOB: 15-Jun-1955  Admit date:     02/28/2023  Discharge date: 03/03/23  Discharge Physician: Alba Cory   PCP: Estevan Oaks, NP   Recommendations at discharge:    Follow Up bx result with GI Close follow up for duodenal stricture and Barrett esophagus.   Discharge Diagnoses: Principal Problem:   Severe anemia  Resolved Problems:   * No resolved hospital problems. *  Hospital Course: Grace [name] is a 67 y.o. female with medical history significant of former smoker, COPD, hypertension, OSA does not use CPAP, GERD, Barrett's esophagus, hypothyroidism, multinodular goiter, diabetes, hyperlipidemia, obesity, had COVID at the end of August presented for follow-up at his pulmonologist office and labs were obtained for fatigue and shortness of breath.  Patient was found to have a hemoglobin of 7.0. She was referred to the ED for further evaluation.  Hemoglobin in the ED at 6. Patient reports shortness of breath on exertion and fatigue for the last couple of weeks.  She has been feeling weak and tired.  She relates shortness of breath even walking from the kitchen to the bathroom.  She has been having some chest pain especially at night.  Her cardiology  increased her Imdur.  Last episode of chest pain was 3 days prior to admission sharp in quality.  Resolve on its own.  She also report a history of on and off diarrhea loose and watery stool.  Her last bowel movement was the day prior to admission with was a formed stool.  She denies melena, hematochezia hematemesis.   2 units of packed red blood cells were ordered on admission.  Patient was admitted for further evaluation.  Underwent endoscopy: showed Barrett esophagus,     Assessment and Plan:  1-Severe anemia, symptomatic History of Barrett Esophagus.  Iron Deficiency Anemia -Patient presented with shortness of breath on exertion, sporadic episodes of  chest pain, fatigue weakness -Hemoglobin found to be at 6.7. -Denies melena, hematochezia, hematemesis -No hyperbilirubinemia less likely hemolysis -IV Protonix started.  GI has been consulted. Underwent endoscopy 11/18: Barrett esophagus, mild erythematous mucosa without bleeding was found gastric antrum, biopsy take, stricture at level of Duodenum.  -Plan for clear liquid diet.  -Upper GI series with Small bowel follow-through. Showed duodenal stricture.  -S/P  2 units of packed red blood cell 11/16.  -Ok to resume  aspirin.  -Received IV iron Hb increase to 9--9.5  Patient is asymptomatic from duodenal stricture, she has been having BM, tolerating diet. Discussed with Dr Threasa Beards, ok to discharge home on regular diet , Protonix, avoid ibuprofen goody powder.  Anemia could be related to Barrette esophagus.   2-COPD: Continue with  albuterol and Bestri.    3-DM type 2;  Resume  Glipizide.    4-HTN, CAD;  Resume Bystolic and Imdur.  Resume cozaar for now.    5-History of CVA; resume  aspirin    6-Hypothyroidism; Continue with  Synthroid.    7-OSA; Not compliant with CPAP./   Obesity class 1, BMI 32    Estimated body mass index is 32.24 kg/m as calculated from the following:   Height as of this encounter: 5\' 3"  (1.6 m).   Weight as of this encounter: 82.6 kg.         Consultants: Dr Marca Ancona Procedures performed: Endoscopy Disposition: Home Diet recommendation:  Discharge Diet Orders (From admission, onward)     Start     Ordered  03/03/23 0000  Diet - low sodium heart healthy        03/03/23 1337           Carb modified diet DISCHARGE MEDICATION: Allergies as of 03/03/2023       Reactions   Amlodipine    Cramps   Codeine    REACTION: headaches   Gabapentin    headache   Prozac [fluoxetine Hcl] Other (See Comments)   Suicidal ideations        Medication List     STOP taking these medications    GOODYS BODY PAIN PO   ibuprofen 200 MG  tablet Commonly known as: ADVIL   omeprazole 40 MG capsule Commonly known as: PRILOSEC   predniSONE 10 MG tablet Commonly known as: DELTASONE   Trelegy Ellipta 200-62.5-25 MCG/ACT Aepb Generic drug: Fluticasone-Umeclidin-Vilant       TAKE these medications    albuterol 108 (90 Base) MCG/ACT inhaler Commonly known as: ProAir HFA INHALE 2 PUFFS EVERY 6  HOURS AS NEEDED FOR  SHORTNESS OF BREATH OR  WHEEZING What changed:  how much to take how to take this when to take this reasons to take this additional instructions   aspirin EC 81 MG tablet Take 1 tablet (81 mg total) by mouth daily. Swallow whole.   bisoprolol 10 MG tablet Commonly known as: ZEBETA Take 10 mg by mouth daily.   Breztri Aerosphere 160-9-4.8 MCG/ACT Aero Generic drug: Budeson-Glycopyrrol-Formoterol Inhale 2 puffs into the lungs in the morning and at bedtime.   calcium carbonate 500 MG chewable tablet Commonly known as: TUMS - dosed in mg elemental calcium Chew 1 tablet by mouth daily as needed for indigestion or heartburn.   COLLAGEN-VITAMIN C PO Take 3 tablets by mouth in the morning and at bedtime.   estradiol 0.1 MG/GM vaginal cream Commonly known as: ESTRACE VAGINAL Place 1 Applicatorful vaginally at bedtime. What changed: when to take this   ezetimibe 10 MG tablet Commonly known as: ZETIA Take 10 mg by mouth daily.   fluticasone 50 MCG/ACT nasal spray Commonly known as: FLONASE Place 2 sprays into both nostrils daily.   furosemide 40 MG tablet Commonly known as: LASIX Take 1 tab daily as needed for weight gain of 3 lb over night or 5 lb in 1 week. What changed:  how much to take how to take this when to take this   glipiZIDE 5 MG 24 hr tablet Commonly known as: GLUCOTROL XL TAKE 1 TABLET BY MOUTH  DAILY WITH BREAKFAST What changed: when to take this   glucose blood test strip Commonly known as: OneTouch Verio Use to check blood sugar twice daily. Dx: E11.9   guaiFENesin 600  MG 12 hr tablet Commonly known as: MUCINEX Take 600 mg by mouth 2 (two) times daily as needed for to loosen phlegm or cough.   hydrALAZINE 50 MG tablet Commonly known as: APRESOLINE TAKE 1 TABLET BY MOUTH 3  TIMES DAILY   ipratropium-albuterol 0.5-2.5 (3) MG/3ML Soln Commonly known as: DUONEB Take 3 mLs by nebulization every 6 (six) hours as needed.   iron polysaccharides 150 MG capsule Commonly known as: NIFEREX Take 1 capsule (150 mg total) by mouth daily.   isosorbide mononitrate 30 MG 24 hr tablet Commonly known as: IMDUR Take 2 tablets (60 mg total) by mouth daily.   levothyroxine 200 MCG tablet Commonly known as: SYNTHROID Take 200 mcg by mouth at bedtime.   losartan 100 MG tablet Commonly known as: COZAAR TAKE 1 TABLET  BY MOUTH  DAILY What changed: when to take this   Myrbetriq 50 MG Tb24 tablet Generic drug: mirabegron ER Take 50 mg by mouth daily.   Nebulizer/Tubing/Mouthpiece Kit 1 application by Does not apply route every 6 (six) hours as needed. FAX TO [phone]   OneTouch Delica Plus Lancet33G Misc USE TO CHECK BLOOD SUGAR  TWICE DAILY   pantoprazole 40 MG tablet Commonly known as: Protonix Take 1 tablet (40 mg total) by mouth 2 (two) times daily.   pregabalin 50 MG capsule Commonly known as: LYRICA Take 50 mg by mouth daily.   Repatha SureClick 140 MG/ML Soaj Generic drug: Evolocumab INJECT 1 PEN SUBCUTANEOUSLY  EVERY 2 WEEKS What changed: See the new instructions.   traMADol 50 MG tablet Commonly known as: Ultram Take 1 tablet (50 mg total) by mouth every 6 (six) hours as needed for up to 5 days.   valACYclovir 500 MG tablet Commonly known as: VALTREX TAKE 1 TABLET BY MOUTH  DAILY        Discharge Exam: Filed Weights   02/28/23 0956  Weight: 82.6 kg   General; NAD  Condition at discharge: stable  The results of significant diagnostics from this hospitalization (including imaging, microbiology, ancillary and laboratory) are  listed below for reference.   Imaging Studies: DG UGI W SMALL BOWEL  Result Date: 03/03/2023 CLINICAL DATA:  [id number] Duodenal stenosis [id number]. Iron-deficiency anemia. Looking for source of blood loss. EXAM: UPPER GI SERIES WITH SMALL BOWEL FOLLOW-THROUGH UTILIZING HIGH-DENSITY AND LOW-DENSITY BARIUM. FLUOROSCOPY: Radiation Exposure Index (as provided by the fluoroscopic device): 237.7 mGy Kerma TECHNIQUE: Combined double contrast and single contrast upper GI series using effervescent crystals, thick barium, and thin barium. Subsequently, serial images of the small bowel were obtained in supine and prone position. COMPARISON:  Upper GI exam from 06/06/2020. FINDINGS: Scout radiograph: Nonobstructive bowel gas pattern. There is small amount of stool burden mainly in the ascending colon. No free air under the domes of diaphragm. Visualized bilateral lungs and bilateral lateral costophrenic angle are clear. Normal cardiomediastinal silhouette. There are metallic staples in the right upper quadrant, presumably due to prior cholecystectomy. Upper GI exam: Double contrast views of the esophageal mucosa are normal-appearing. There is no focal stricture. However, note is made of global reduced diameter of the esophagus measuring on average up to 10 mm with distention. There is no focal irregularity/erosion. No ring or web seen. No hiatal hernia. No spontaneous gastro-esophageal reflux noted. Gastric distension is adequate. Gastric folds are normal. There are no erosions or large ulcers. Duodenal bulb distension is adequate. There is small ulcer in the region of duodenal bulb (series 9) and associated narrowing of the duodenum measuring up to 6 mm in maximum diameter (series 14 and 15). Rest of the duodenum appears normal in caliber and without focal erosion or ulceration. Note is made of ingested tablet in the pylorus/duodenal bulb region. The duodenal-jejunal junction is normal. Small-bowel follow-through:  Follow-through of the small bowel shows normal transit of barium through the jejunum and ileum into the right side of the colon without evidence of obstruction. No fixed polypoid, ulcerated, or annular lesions are seen in the small bowel. No other abnormalities are identified. IMPRESSION: 1. There is a small ulcer in the region of duodenal bulb and associated narrowing of the duodenal measuring up to 6 mm in maximum diameter. 2. Diffusely reduced diameter of the esophagus measuring up to 10 mm without focal stricture or erosion. No ring or web. 3. Unremarkable small bowel  follow-through exam. No fixed polypoid, ulcerated or annual lesions are seen in the small bowel. Electronically Signed   By: Jules Schick M.D.   On: 03/03/2023 11:23    Microbiology: Results for orders placed or performed during the hospital encounter of 07/07/20  SARS CORONAVIRUS 2 (TAT 6-24 HRS) Nasopharyngeal Nasopharyngeal Swab     Status: None   Collection Time: 07/07/20  2:21 PM   Specimen: Nasopharyngeal Swab  Result Value Ref Range Status   SARS Coronavirus 2 NEGATIVE NEGATIVE Final    Comment: (NOTE) SARS-CoV-2 target nucleic acids are NOT DETECTED.  The SARS-CoV-2 RNA is generally detectable in upper and lower respiratory specimens during the acute phase of infection. Negative results do not preclude SARS-CoV-2 infection, do not rule out co-infections with other pathogens, and should not be used as the sole basis for treatment or other patient management decisions. Negative results must be combined with clinical observations, patient history, and epidemiological information. The expected result is Negative.  Fact Sheet for Patients: HairSlick.no  Fact Sheet for Healthcare Providers: quierodirigir.com  This test is not yet approved or cleared by the [place] FDA and  has been authorized for detection and/or diagnosis of SARS-CoV-2 by FDA under an  Emergency Use Authorization (EUA). This EUA will remain  in effect (meaning this test can be used) for the duration of the COVID-19 declaration under Se ction 564(b)(1) of the Act, 21 U.S.C. section 360bbb-3(b)(1), unless the authorization is terminated or revoked sooner.  Performed at [facility], [address] [place], [place] [address]     Labs: CBC: Recent Labs  Lab 02/26/23 1437 02/28/23 1033 03/01/23 0726 03/02/23 0728 03/03/23 1140  WBC 5.5 5.1 6.8 5.7  --   NEUTROABS 3.2 2.9  --   --   --   HGB 7.1 Repeated and verified X2.* 6.4* 9.0* 9.5* 9.5*  HCT 26.3 Repeated and verified X2.* 24.9* 31.6* 33.7* 34.8*  MCV 69.5 Repeated and verified X2.* 75.5* 76.0* 76.6*  --   PLT 281.0 263 245 261  --    Basic Metabolic Panel: Recent Labs  Lab 02/26/23 1437 02/28/23 1033 03/01/23 0726 03/02/23 0728  NA 139 132* 137 136  K 4.4 3.7 4.2 4.1  CL 106 102 105 105  CO2 25 22 26 25   GLUCOSE 99 119* 115* 104*  BUN 15 13 9  7*  CREATININE 1.04 0.74 0.82 0.73  CALCIUM 9.2 8.5* 8.9 9.5   Liver Function Tests: Recent Labs  Lab 02/28/23 1033  AST 19  ALT 13  ALKPHOS 50  BILITOT 0.3  PROT 6.5  ALBUMIN 3.4*   CBG: Recent Labs  Lab 03/02/23 1149 03/02/23 1744 03/02/23 2150 03/03/23 0705 03/03/23 1204  GLUCAP 122* 102* 145* 101* 116*    Discharge time spent: greater than 30 minutes.  Signed: Alba Cory, MD [facility] 03/03/2023

## 2023-03-03 NOTE — Plan of Care (Signed)
  Problem: Education: Goal: Ability to describe self-care measures that may prevent or decrease complications (Diabetes Survival Skills Education) will improve Outcome: Progressing Goal: Individualized Educational Video(s) Outcome: Progressing   

## 2023-03-03 NOTE — Progress Notes (Signed)
Upper GI series shows a small ulcer in region of duodenal bulb with associated narrowing of duodenal lumen measuring up to 6 mm in maximum diameter. Diffusely reduced diameter of esophagus up to 10 mm without stricture or lesion, no ring or web. Unremarkable small bowel follow-through, no polypoid lesion.  Known history of Barrett's esophagus with high-grade dysplasia status post EMR. Biopsies from EGD yesterday pending from esophagus and antrum. Patient will need to be on PPI once a day indefinitely. Biopsies can be followed as outpatient

## 2023-03-04 ENCOUNTER — Encounter (HOSPITAL_COMMUNITY): Payer: Self-pay | Admitting: Gastroenterology

## 2023-03-18 MED ORDER — BREZTRI AEROSPHERE 160-9-4.8 MCG/ACT IN AERO: 2.0000 | INHALATION_SPRAY | Freq: Two times a day (BID) | RESPIRATORY_TRACT | 3 refills | Status: DC

## 2023-03-18 NOTE — Telephone Encounter (Signed)
Just an FYI

## 2023-03-30 ENCOUNTER — Encounter (HOSPITAL_BASED_OUTPATIENT_CLINIC_OR_DEPARTMENT_OTHER): Payer: Self-pay

## 2023-03-30 ENCOUNTER — Ambulatory Visit (HOSPITAL_BASED_OUTPATIENT_CLINIC_OR_DEPARTMENT_OTHER): Payer: 59 | Admitting: Internal Medicine

## 2023-04-05 ENCOUNTER — Encounter (HOSPITAL_BASED_OUTPATIENT_CLINIC_OR_DEPARTMENT_OTHER): Payer: 59 | Admitting: Internal Medicine

## 2023-04-16 NOTE — Progress Notes (Signed)
 Office Visit    Patient Name: Grace [name] Date of Encounter: 04/17/2023  Primary Care Provider:  Delores Rojelio Caldron, NP Primary Cardiologist:  Alm Clay, MD  Chief Complaint    68 year old female with a history of CAD, hypertension, hyperlipidemia, hypothyroidism, type 2 diabetes, CVA, OSA, COPD and GERD who presents for follow-up related to CAD.    Past Medical History    Past Medical History:  Diagnosis Date   Abnormal vaginal bleeding    Anxiety    Asthma    Barrett's esophagus    with high grade dysplasia per endoscopy 05/2005 // followed by Dr. Lupita Commander ([facility])   Chronic back pain    COPD (chronic obstructive pulmonary disease) (HCC)    Diabetes mellitus without complication (HCC)    type 2   Duodenal ulcer disease    thought to be contributed at least partly by overuse of NSAIDs and salicylates (goody powdr)   Endometrial polyp 03/2003   s/p resection in 03/2003. Path showing submucosal leiomyoma and benign  proliferative type endometrium   Family history of premature CAD 05/19/2018   Fibroid uterus    s/p myomectomy x 2   GERD (gastroesophageal reflux disease)    H/O failed moderate sedation    Hiatal hernia    History of CVA (cerebrovascular accident) 10/2000   History of pineal cyst    11 mm cystic mass noted in the pineal gland per MRI in 2001 - most consitent with simple pineal cyst   HSV infection    Hyperlipidemia    Hypertension    Hypothyroidism    with hx of multinodular goiter (noted on neck US  in 11/2002 - showing diffuse nodularity and inhomogenous texture diffusely BL)   Internal hemorrhoids    grade 2 per colonoscopy in 09/2006 - repeat colonoscopy rec in 5-10 years.   Migraine    Nevus    Personal history of failed conscious sedation 08/11/2011   Sleep apnea    does not use cpap   Stroke (HCC) 10/2000   Tachycardia    Thyroid  disease    Tobacco abuse Jan 2014   quit   Past Surgical History:  Procedure Laterality Date    ADENOIDECTOMY     APPENDECTOMY  2017   BALLOON DILATION N/A 06/14/2020   Procedure: BALLOON DILATION;  Surgeon: Wilhelmenia Aloha Raddle., MD;  Location: [facility] ENDOSCOPY;  Service: Gastroenterology;  Laterality: N/A;   BIOPSY  02/15/2020   Procedure: BIOPSY;  Surgeon: Burnette Fallow, MD;  Location: [facility] ENDOSCOPY;  Service: Endoscopy;;   BIOPSY  06/14/2020   Procedure: BIOPSY;  Surgeon: Wilhelmenia Aloha Raddle., MD;  Location: [facility] ENDOSCOPY;  Service: Gastroenterology;;   BIOPSY  03/02/2023   Procedure: BIOPSY;  Surgeon: Saintclair Jasper, MD;  Location: [facility] ENDOSCOPY;  Service: Gastroenterology;;   BRONCHIAL BIOPSY  03/15/2020   Procedure: BRONCHIAL BIOPSIES;  Surgeon: Brenna Adine LITTIE, DO;  Location: [facility] ENDOSCOPY;  Service: Pulmonary;;   BRONCHIAL BRUSHINGS  03/15/2020   Procedure: BRONCHIAL BRUSHINGS;  Surgeon: Brenna Adine LITTIE, DO;  Location: [facility] ENDOSCOPY;  Service: Pulmonary;;   BRONCHIAL NEEDLE ASPIRATION BIOPSY  03/15/2020   Procedure: BRONCHIAL NEEDLE ASPIRATION BIOPSIES;  Surgeon: Brenna Adine LITTIE, DO;  Location: [facility] ENDOSCOPY;  Service: Pulmonary;;   BRONCHIAL WASHINGS  03/15/2020   Procedure: BRONCHIAL WASHINGS;  Surgeon: Brenna Adine LITTIE, DO;  Location: [facility] ENDOSCOPY;  Service: Pulmonary;;   CHOLECYSTECTOMY     COLONOSCOPY  02/07/2010   COLONOSCOPY WITH PROPOFOL  N/A 02/15/2020   Procedure: COLONOSCOPY  WITH PROPOFOL ;  Surgeon: Burnette Fallow, MD;  Location: [facility] ENDOSCOPY;  Service: Endoscopy;  Laterality: N/A;   DILATION AND CURETTAGE OF UTERUS     ENDOSCOPIC MUCOSAL RESECTION N/A 06/14/2020   Procedure: ENDOSCOPIC MUCOSAL RESECTION;  Surgeon: Wilhelmenia Aloha Raddle., MD;  Location: [facility] ENDOSCOPY;  Service: Gastroenterology;  Laterality: N/A;   ESOPHAGOGASTRODUODENOSCOPY (EGD) WITH PROPOFOL  N/A 02/15/2020   Procedure: ESOPHAGOGASTRODUODENOSCOPY (EGD) WITH PROPOFOL ;  Surgeon: Burnette Fallow, MD;  Location: [facility] ENDOSCOPY;  Service: Endoscopy;  Laterality: N/A;   ESOPHAGOGASTRODUODENOSCOPY (EGD) WITH PROPOFOL   N/A 06/14/2020   Procedure: ESOPHAGOGASTRODUODENOSCOPY (EGD) WITH PROPOFOL ;  Surgeon: Wilhelmenia Aloha Raddle., MD;  Location: [facility] ENDOSCOPY;  Service: Gastroenterology;  Laterality: N/A;   ESOPHAGOGASTRODUODENOSCOPY (EGD) WITH PROPOFOL  Left 03/02/2023   Procedure: ESOPHAGOGASTRODUODENOSCOPY (EGD) WITH PROPOFOL ;  Surgeon: Saintclair Jasper, MD;  Location: [facility] ENDOSCOPY;  Service: Gastroenterology;  Laterality: Left;   FOREIGN BODY REMOVAL  06/14/2020   Procedure: FOREIGN BODY REMOVAL;  Surgeon: Wilhelmenia Aloha Raddle., MD;  Location: [facility] ENDOSCOPY;  Service: Gastroenterology;;   HYSTEROSCOPY     LAPAROSCOPIC APPENDECTOMY N/A 09/23/2015   Procedure: APPENDECTOMY LAPAROSCOPIC;  Surgeon: Dann Hummer, MD;  Location: [facility] OR;  Service: General;  Laterality: N/A;   MYOMECTOMY  1990, 1997   x 2 - In 1997, noted to have extensive pelvic adhesions and BL tubal obstruction   POLYPECTOMY  02/15/2020   Procedure: POLYPECTOMY;  Surgeon: Burnette Fallow, MD;  Location: [facility] ENDOSCOPY;  Service: Endoscopy;;   TONSILLECTOMY     TRANSTHORACIC ECHOCARDIOGRAM  06/2014    EF 55-60%.  No RWMA. Gr 1 DD. Cannot R/o PFO (consider bubble study).   UPPER ESOPHAGEAL ENDOSCOPIC ULTRASOUND (EUS) N/A 06/14/2020   Procedure: UPPER ESOPHAGEAL ENDOSCOPIC ULTRASOUND (EUS);  Surgeon: Wilhelmenia Aloha Raddle., MD;  Location: [facility] ENDOSCOPY;  Service: Gastroenterology;  Laterality: N/A;   UPPER GASTROINTESTINAL ENDOSCOPY  12/18/2009   VIDEO BRONCHOSCOPY WITH ENDOBRONCHIAL NAVIGATION N/A 03/15/2020   Procedure: VIDEO BRONCHOSCOPY WITH ENDOBRONCHIAL NAVIGATION;  Surgeon: Brenna Adine CROME, DO;  Location: [facility] ENDOSCOPY;  Service: Pulmonary;  Laterality: N/A;   VIDEO BRONCHOSCOPY WITH ENDOBRONCHIAL ULTRASOUND  03/15/2020   Procedure: VIDEO BRONCHOSCOPY WITH ENDOBRONCHIAL ULTRASOUND;  Surgeon: Brenna Adine CROME, DO;  Location: [facility] ENDOSCOPY;  Service: Pulmonary;;   WISDOM TOOTH EXTRACTION      Allergies  Allergies  Allergen Reactions   Amlodipine       Cramps    Codeine     REACTION: headaches   Gabapentin      headache   Prozac [Fluoxetine Hcl] Other (See Comments)    Suicidal ideations     Labs/Other Studies Reviewed    The following studies were reviewed today:  Cardiac Studies & Procedures         CT SCANS  CT CORONARY MORPH W/CTA COR W/SCORE 08/29/2022  Addendum 09/03/2022 10:57 PM ADDENDUM REPORT: 09/03/2022 22:55  EXAM: OVER-READ INTERPRETATION  CT CHEST  The following report is an over-read performed by radiologist Dr. Leita Sandy [facility] on 09/03/2022. This over-read does not include interpretation of cardiac or coronary anatomy or pathology. The cardiac/coronary CTA interpretation by the cardiologist is attached.  COMPARISON:  09/25/2021.  FINDINGS: Heart is borderline enlarged and there is no pericardial effusion. Three-vessel coronary artery calcifications are noted. There is mild atherosclerotic calcification of the aorta without evidence of aneurysm. The pulmonary trunk is normal in caliber.  No mediastinal lymphadenopathy. The visualized portion of the esophagus is within normal limits.  Mild centrilobular emphysematous changes are present in the lungs. Atelectasis or scarring is noted  at the lung bases. No effusion or pneumothorax.  Fatty infiltration of the liver is noted.  No acute abnormality.  Degenerative changes are noted in the thoracic spine. No acute osseous abnormality.  IMPRESSION: 1. Emphysema. 2. Coronary artery calcifications. 3. Aortic atherosclerosis. 4. Hepatic steatosis.   Electronically Signed By: Leita Birmingham M.D. On: 09/03/2022 22:55  Narrative CLINICAL DATA:  68 year old with chest pain  EXAM: Cardiac/Coronary  CTA  TECHNIQUE: The patient was scanned on a [employer].  FINDINGS: A 120 kV prospective scan was triggered in the descending thoracic aorta at 111 HU's. Axial non-contrast 3 mm slices were carried out through the  heart. The data set was analyzed on a dedicated work station and scored using the Agatson method. Gantry rotation speed was 250 msecs and collimation was .6 mm. 0.8 mg of sl NTG was given. The 3D data set was reconstructed in 5% intervals of the 67-82 % of the R-R cycle. Diastolic phases were analyzed on a dedicated work station using MPR, MIP and VRT modes. The patient received 80 cc of contrast.  Image quality: good  Aorta:  Normal size.  No calcifications.  No dissection.  Aortic Valve:  Trileaflet.  No calcifications.  Coronary Arteries:  Normal coronary origin.  Right dominance.  RCA is a large dominant artery that gives rise to PDA and PLA. There is dense proximal/mid mixed plaque 70-99% stenosis. FFR 0.98 to 0.83 mid, and 0.83 to 0.65 mid to distal (abnormal).  Left main is a large artery that gives rise to LAD and LCX arteries.  LAD is a large vessel that has dense mixed plaque proximal/mid with 30-49% stenosis and tapering of mid vessel. No FFR modeling of mid to distal vessel.  D1-small caliber bifurcating vessel with calcified plaque.  LCX is a non-dominant artery. There is diffuse mixed plaque, mild stenosis.  OM1-moderate sized vessel.  No stenosis.  Other findings:  Normal pulmonary vein drainage into the left atrium.  Normal left atrial appendage without a thrombus.  Normal size of the pulmonary artery.  Please see radiology report for non cardiac findings.  IMPRESSION: 1. Coronary calcium  score of 2262. This was 57 percentile for age and sex matched control.  2. Total plaque volume (TPV) 1356 mm3 which is 97 percentile for age-and sex matched controls (calcified plaque 526 mm3; non-calcified plaque 830 mm3). TPV is extensive.  3. Normal coronary origin with right dominance.  4. Severe CAD of mid RCA (FFR abnormal), mild proximal LAD and CX. Possible significant disease of mid LAD to distal (not modeled by FFR).  Electronically Signed: By: Oneil Parchment M.D. On: 08/29/2022 14:58         Recent Labs: 02/26/2023: Pro B Natriuretic peptide (BNP) 234.0 02/28/2023: ALT 13 03/02/2023: BUN 7; Creatinine, Ser 0.73; Platelets 261; Potassium 4.1; Sodium 136 03/03/2023: Hemoglobin 9.5  Recent Lipid Panel    Component Value Date/Time   CHOL 99 (L) 01/13/2023 0831   TRIG 131 01/13/2023 0831   HDL 45 01/13/2023 0831   CHOLHDL 2.2 01/13/2023 0831   CHOLHDL 2.6 09/28/2019 1211   VLDL 26 09/19/2016 1512   LDLCALC 31 01/13/2023 0831   LDLCALC 56 09/28/2019 1211   LDLDIRECT 104 (H) 06/12/2010 1641    History of Present Illness    68 year old female with the above past medical history including CAD, hypertension, hyperlipidemia, hypothyroidism, type 2 diabetes, CVA, OSA, COPD and GERD.   She has a history of COPD, former tobacco use, follows with  pulmonology.  She has a strong family history of CAD. She contacted our office on 08/18/2022 and noted that she had an echocardiogram with her PCP who recommended follow-up with cardiology based on the results.  Echocardiogram ultimately showed EF 60 to 65%, normal LV systolic function, mild LVH, G1 DD, no significant valvular abnormalities. Coronary CT angiogram in 08/2022 revealed coronary calcium  score 2262 (99th percentile), extensive TPV, severe CAD of the mid RCA with abnormal FFR, mild proximal LAD and left circumflex stenoses, possible significant disease of the mid LAD to distal LAD (not mottled by FFR).  Findings were discussed with Dr. Anner- in the absence of symptoms initial trial of medical therapy was advised.  She was started on Imdur .  Aspirin , high-dose statin, and beta-blocker were also advised.  Given statin intolerance, she was referred to lipid clinic Pharm.D. for consideration of PCSK9 inhibitor.  It was noted that should she fail medical therapy, cardiac catheterization could be advised.  She was last seen in the office on 01/15/2023 and was stable from a cardiac standpoint.  She did  note ongoing intermittent chest discomfort that occurred at night while lying in bed, Imdur  was increased to 60 mg daily.  She was hospitalized in November 2024 in the setting of severe symptomatic anemia, hemoglobin 6.4  Endoscopy showed Barrett's esophagus, biopsies were taken.  She received 2 units of PRBC.  She was discharged home in stable condition on 03/03/2023.   She presents today for follow-up.  Since her last visit she has been stable overall from a cardiac standpoint.  She denies any symptoms concerning for angina.  She has stable chronic dyspnea, unchanged from prior visits.  She denies palpitations, edema, PND, orthopnea, weight gain.  Overall, she reports feeling well.  Home Medications    Current Outpatient Medications  Medication Sig Dispense Refill   albuterol  (PROAIR  HFA) 108 (90 Base) MCG/ACT inhaler INHALE 2 PUFFS EVERY 6  HOURS AS NEEDED FOR  SHORTNESS OF BREATH OR  WHEEZING (Patient taking differently: Inhale 2 puffs into the lungs every 6 (six) hours as needed for wheezing.) 34 g 3   aspirin  EC 81 MG tablet Take 1 tablet (81 mg total) by mouth daily. Swallow whole. 30 tablet 0   bisoprolol  (ZEBETA ) 10 MG tablet Take 10 mg by mouth daily.     Budeson-Glycopyrrol-Formoterol  (BREZTRI  AEROSPHERE) 160-9-4.8 MCG/ACT AERO Inhale 2 puffs into the lungs in the morning and at bedtime. 3 each 3   calcium  carbonate (TUMS - DOSED IN MG ELEMENTAL CALCIUM ) 500 MG chewable tablet Chew 1 tablet by mouth daily as needed for indigestion or heartburn.     COLLAGEN-VITAMIN C PO Take 3 tablets by mouth in the morning and at bedtime.     estradiol  (ESTRACE  VAGINAL) 0.1 MG/GM vaginal cream Place 1 Applicatorful vaginally at bedtime. (Patient taking differently: Place 1 Applicatorful vaginally 2 (two) times a week.) 42.5 g 12   ezetimibe  (ZETIA ) 10 MG tablet Take 10 mg by mouth daily.     fluticasone  (FLONASE ) 50 MCG/ACT nasal spray Place 2 sprays into both nostrils daily. 18.2 mL 2   furosemide   (LASIX ) 40 MG tablet Take 1 tab daily as needed for weight gain of 3 lb over night or 5 lb in 1 week. (Patient taking differently: Take 40 mg by mouth See admin instructions. Take 1 tab daily as needed for weight gain of 3 lb over night or 5 lb in 1 week.) 90 tablet 3   glipiZIDE  (GLUCOTROL  XL) 5 MG 24  hr tablet TAKE 1 TABLET BY MOUTH  DAILY WITH BREAKFAST (Patient taking differently: Take 5 mg by mouth every evening.) 90 tablet 1   glucose blood (ONETOUCH VERIO) test strip Use to check blood sugar twice daily. Dx: E11.9 300 each 1   guaiFENesin  (MUCINEX ) 600 MG 12 hr tablet Take 600 mg by mouth 2 (two) times daily as needed for to loosen phlegm or cough.     hydrALAZINE  (APRESOLINE ) 50 MG tablet TAKE 1 TABLET BY MOUTH 3  TIMES DAILY (Patient taking differently: Take 50 mg by mouth 3 (three) times daily.) 270 tablet 3   ipratropium-albuterol  (DUONEB) 0.5-2.5 (3) MG/3ML SOLN Take 3 mLs by nebulization every 6 (six) hours as needed. 360 mL 6   iron  polysaccharides (NIFEREX) 150 MG capsule Take 1 capsule (150 mg total) by mouth daily. 30 capsule 0   isosorbide  mononitrate (IMDUR ) 30 MG 24 hr tablet Take 2 tablets (60 mg total) by mouth daily. 180 tablet 3   Lancets (ONETOUCH DELICA PLUS LANCET33G) MISC USE TO CHECK BLOOD SUGAR  TWICE DAILY 200 each 3   levothyroxine  (SYNTHROID ) 200 MCG tablet Take 200 mcg by mouth at bedtime.     losartan  (COZAAR ) 100 MG tablet TAKE 1 TABLET BY MOUTH  DAILY (Patient taking differently: Take 100 mg by mouth every evening.) 90 tablet 3   mirabegron  ER (MYRBETRIQ ) 50 MG TB24 tablet Take 50 mg by mouth daily.     pantoprazole  (PROTONIX ) 40 MG tablet Take 1 tablet (40 mg total) by mouth 2 (two) times daily. 60 tablet 1   pregabalin  (LYRICA ) 50 MG capsule Take 50 mg by mouth daily.     REPATHA  SURECLICK 140 MG/ML SOAJ INJECT 1 PEN SUBCUTANEOUSLY  EVERY 2 WEEKS (Patient taking differently: Inject 140 mg into the skin 2 (two) times a week.) 6 mL 3   Respiratory Therapy Supplies  (NEBULIZER/TUBING/MOUTHPIECE) KIT 1 application by Does not apply route every 6 (six) hours as needed. FAX TO [phone] 1 kit 11   traMADol  (ULTRAM ) 50 MG tablet Take 50 mg by mouth 3 (three) times daily as needed.     valACYclovir  (VALTREX ) 500 MG tablet TAKE 1 TABLET BY MOUTH  DAILY (Patient taking differently: Take 500 mg by mouth daily.) 90 tablet 3   No current facility-administered medications for this visit.     Review of Systems    She denies chest pain, palpitations, pnd, orthopnea, n, v, dizziness, syncope, weight gain, or early satiety. All other systems reviewed and are otherwise negative except as noted above.   Physical Exam    VS:  BP 120/80 (BP Location: Left Arm, Patient Position: Sitting, Cuff Size: Normal)   Pulse 71   Ht 5' 3 (1.6 m)   Wt 186 lb (84.4 kg)   LMP 02/27/2014   SpO2 95%   BMI 32.95 kg/m   GEN: Well nourished, well developed, in no acute distress. HEENT: normal. Neck: Supple, no JVD, carotid bruits, or masses. Cardiac: RRR, no murmurs, rubs, or gallops. No clubbing, cyanosis, nonpitting bilateral pedal edema.  Radials/DP/PT 2+ and equal bilaterally.  Respiratory:  Respirations regular and unlabored, clear to auscultation bilaterally. GI: Soft, nontender, nondistended, BS + x 4. MS: no deformity or atrophy. Skin: warm and dry, no rash. Neuro:  Strength and sensation are intact. Psych: Normal affect.  Accessory Clinical Findings    ECG personally reviewed by me today -    - no EKG in office today.   Lab Results  Component Value Date  WBC 5.7 03/02/2023   HGB 9.5 (L) 03/03/2023   HCT 34.8 (L) 03/03/2023   MCV 76.6 (L) 03/02/2023   PLT 261 03/02/2023   Lab Results  Component Value Date   CREATININE 0.73 03/02/2023   BUN 7 (L) 03/02/2023   NA 136 03/02/2023   K 4.1 03/02/2023   CL 105 03/02/2023   CO2 25 03/02/2023   Lab Results  Component Value Date   ALT 13 02/28/2023   AST 19 02/28/2023   ALKPHOS 50 02/28/2023   BILITOT 0.3  02/28/2023   Lab Results  Component Value Date   CHOL 99 (L) 01/13/2023   HDL 45 01/13/2023   LDLCALC 31 01/13/2023   LDLDIRECT 104 (H) 06/12/2010   TRIG 131 01/13/2023   CHOLHDL 2.2 01/13/2023    Lab Results  Component Value Date   HGBA1C 6.9 (H) 02/28/2023    Assessment & Plan    1. CAD: Coronary CT angiogram in 08/2022 revealed coronary calcium  score 2262 (99th percentile), extensive TPV, severe CAD of the mid RCA with abnormal FFR, mild proximal LAD and left circumflex stenoses, possible significant disease of the mid LAD to distal LAD (not mottled by FFR).  Findings were discussed with Dr. Anner in the absence of symptoms, initial trial of medical therapy was advised.  She has stable chronic dyspnea in the setting of COPD, otherwise stable with no anginal symptoms.  Should she develop progressive symptoms despite medical therapy, consider need for further ischemic evaluation (possible catheterization). Continue aspirin , bisoprolol , losartan , hydralazine , Imdur , Zetia , and Repatha .   2.  LVH/diastolic dysfunction: Echocardiogram in 07/2022 showed EF 60 to 65%, normal LV systolic function, mild LVH, G1 DD, no significant valvular abnormalities.  She has stable nonpitting bilateral pedal edema. Otherwise, euvolemic and well compensated on exam.  Continue bisoprolol , as needed Lasix .   3. Hypertension: BP well controlled. Continue current antihypertensive regimen.   3. Hyperlipidemia/statin intolerance: LDL was 31 in 01/2023. Continue Repatha .    4. Type 2 diabetes: A1c was 6.9 in 02/2023. Monitored and managed per PCP.    5. History of CVA: No recurrence. Continue aspirin .   6. OSA: Intermittent adherence to CPAP.  She is following with pulmonology regarding possibility of obtaining new CPAP supplies. Encouraged follow-up and adherence.    7. COPD: Stable intermittent dyspnea.  Follows with pulmonology.   8. Anemia: Recent hospitalization in the setting of severe symptomatic  anemia.  Most recent hemoglobin 03/02/2024 4 was 9.5.  On iron  supplementation, denies bleeding.  Following with PCP.   9. Disposition: Follow-up in 6 months, sooner if needed.       Damien JAYSON Braver, NP 04/17/2023, 11:36 AM

## 2023-04-17 ENCOUNTER — Encounter: Payer: Self-pay | Admitting: Nurse Practitioner

## 2023-04-17 ENCOUNTER — Ambulatory Visit: Payer: 59 | Attending: Nurse Practitioner | Admitting: Nurse Practitioner

## 2023-04-17 VITALS — BP 120/80 | HR 71 | Ht 63.0 in | Wt 186.0 lb

## 2023-04-17 DIAGNOSIS — I25118 Atherosclerotic heart disease of native coronary artery with other forms of angina pectoris: Secondary | ICD-10-CM | POA: Diagnosis not present

## 2023-04-17 DIAGNOSIS — J449 Chronic obstructive pulmonary disease, unspecified: Secondary | ICD-10-CM

## 2023-04-17 DIAGNOSIS — I5189 Other ill-defined heart diseases: Secondary | ICD-10-CM

## 2023-04-17 DIAGNOSIS — I517 Cardiomegaly: Secondary | ICD-10-CM | POA: Diagnosis not present

## 2023-04-17 DIAGNOSIS — E119 Type 2 diabetes mellitus without complications: Secondary | ICD-10-CM

## 2023-04-17 DIAGNOSIS — Z8673 Personal history of transient ischemic attack (TIA), and cerebral infarction without residual deficits: Secondary | ICD-10-CM

## 2023-04-17 DIAGNOSIS — I10 Essential (primary) hypertension: Secondary | ICD-10-CM

## 2023-04-17 DIAGNOSIS — E785 Hyperlipidemia, unspecified: Secondary | ICD-10-CM

## 2023-04-17 DIAGNOSIS — Z789 Other specified health status: Secondary | ICD-10-CM

## 2023-04-17 DIAGNOSIS — G4733 Obstructive sleep apnea (adult) (pediatric): Secondary | ICD-10-CM

## 2023-04-17 NOTE — Patient Instructions (Signed)
 Medication Instructions:  Continue current medications *If you need a refill on your cardiac medications before your next appointment, please call your pharmacy*   Lab Work: none If you have labs (blood work) drawn today and your tests are completely normal, you will receive your results only by: MyChart Message (if you have MyChart) OR A paper copy in the mail If you have any lab test that is abnormal or we need to change your treatment, we will call you to review the results.   Testing/Procedures: none   Follow-Up: At [facility], you and your health needs are our priority.  As part of our continuing mission to provide you with exceptional heart care, we have created designated Provider Care Teams.  These Care Teams include your primary Cardiologist (physician) and Advanced Practice Providers (APPs -  Physician Assistants and Nurse Practitioners) who all work together to provide you with the care you need, when you need it.  We recommend signing up for the patient portal called MyChart.  Sign up information is provided on this After Visit Summary.  MyChart is used to connect with patients for Virtual Visits (Telemedicine).  Patients are able to view lab/test results, encounter notes, upcoming appointments, etc.  Non-urgent messages can be sent to your provider as well.   To learn more about what you can do with MyChart, go to [email or web address].    Your next appointment:   6 month(s)  Provider:   Alm Clay, MD     Other Instructions none

## 2023-04-28 ENCOUNTER — Encounter: Payer: Self-pay | Admitting: Nurse Practitioner

## 2023-05-11 ENCOUNTER — Telehealth: Payer: Self-pay | Admitting: Student

## 2023-05-11 NOTE — Telephone Encounter (Signed)
PT is canceling PFT and FU appt. States she can not afford it. Just released from hospital and has so many [facility] bills.  I adv her to try to apply for PT assistance thru [facility]. I also told her we'd let the PA know to see if we could poss just do OV. She really wants to keep up on her health and says he 02 levels are monitored and she is doing OK.  Pls call if we can advise anything to assist her.

## 2023-05-13 NOTE — Telephone Encounter (Signed)
Fine to just have f/u with any new MD (needs to establish care with one of the docs as Dr. Tonia Brooms is now gone) for severe COPD. Ok to hold off on PFTs. Previously DOE seemed to be mostly related to anemia, which she was hospitalized for. Thanks!

## 2023-05-14 ENCOUNTER — Encounter (HOSPITAL_BASED_OUTPATIENT_CLINIC_OR_DEPARTMENT_OTHER): Payer: Self-pay

## 2023-05-14 ENCOUNTER — Ambulatory Visit: Payer: 59 | Admitting: Nurse Practitioner

## 2023-05-15 NOTE — Telephone Encounter (Signed)
Noted recall of this info. NFN

## 2023-07-24 ENCOUNTER — Encounter: Payer: Self-pay | Admitting: Obstetrics and Gynecology

## 2023-07-24 ENCOUNTER — Ambulatory Visit (INDEPENDENT_AMBULATORY_CARE_PROVIDER_SITE_OTHER): Payer: 59 | Admitting: Obstetrics and Gynecology

## 2023-07-24 VITALS — BP 132/79 | HR 60 | Ht 61.2 in | Wt 196.8 lb

## 2023-07-24 DIAGNOSIS — R350 Frequency of micturition: Secondary | ICD-10-CM

## 2023-07-24 DIAGNOSIS — K5901 Slow transit constipation: Secondary | ICD-10-CM

## 2023-07-24 DIAGNOSIS — N3941 Urge incontinence: Secondary | ICD-10-CM

## 2023-07-24 DIAGNOSIS — N3281 Overactive bladder: Secondary | ICD-10-CM

## 2023-07-24 LAB — POCT URINALYSIS DIPSTICK
Bilirubin, UA: NEGATIVE
Blood, UA: NEGATIVE
Glucose, UA: NEGATIVE
Ketones, UA: NEGATIVE
Leukocytes, UA: NEGATIVE
Nitrite, UA: NEGATIVE
Protein, UA: NEGATIVE
Spec Grav, UA: 1.02 (ref 1.010–1.025)
Urobilinogen, UA: 0.2 U/dL
pH, UA: 5.5 (ref 5.0–8.0)

## 2023-07-24 MED ORDER — VIBEGRON 75 MG PO TABS: 75.0000 mg | ORAL_TABLET | Freq: Every day | ORAL | 3 refills | Status: DC

## 2023-07-24 NOTE — Progress Notes (Signed)
 [facility] New Patient Evaluation and Consultation  Referring Provider: Carolyn Cisco, NP PCP: Carolyn Cisco, NP Date of Service: 07/24/2023  SUBJECTIVE Chief Complaint: New Patient (Initial Visit) Grace [name] is a 68 y.o. female is here for incontinence.)  History of Present Illness: Grace [name] is a 68 y.o. White or Caucasian female seen in consultation for evaluation of OAB.    Review of records significant for: Significant COPD Baret's esophagus with associated anemia   Urinary Symptoms: Leaks urine with continuously Leaks >10 time(s) per days.  Pad use:  Many  pads per day.   Patient is bothered by UI symptoms.  Day time voids 6+.  Nocturia: 1-2 times per night to void. Voiding dysfunction:  empties bladder well.  Patient does not use a catheter to empty bladder.  When urinating, patient feels she has no difficulties Drinks: 64 oz water, Occasional Grape crush, 7-up zero or fresca with dinner per day  UTIs: 0 UTI's in the last year.   Denies history of blood in urine, kidney or bladder stones, pyelonephritis, bladder cancer, and kidney cancer No results found for the last 90 days.   Pelvic Organ Prolapse Symptoms:                  Patient Denies a feeling of a bulge the vaginal area.   Bowel Symptom: Bowel movements: 3-4 time(s) per week Stool consistency: hard Straining: yes.  Splinting: no.  Incomplete evacuation: yes.  Patient Admits to accidental bowel leakage / fecal incontinence  Occurs: Rarely  Consistency with leakage: soft  Bowel regimen: stool softener Last colonoscopy: Date 2021, Results polyp.  HM Colonoscopy   This patient has no relevant Health Maintenance data.     Sexual Function Sexually active: yes.  Sexual orientation: Straight Pain with sex: Yes, deep in the pelvis  Pelvic Pain Denies pelvic pain    Past Medical History:  Past Medical History:  Diagnosis Date   Abnormal vaginal bleeding     Anxiety    Asthma    Barrett's esophagus    with high grade dysplasia per endoscopy 05/2005 // followed by Dr. Loy Ruff ([facility])   Chronic back pain    COPD (chronic obstructive pulmonary disease) (HCC)    Diabetes mellitus without complication (HCC)    type 2   Duodenal ulcer disease    thought to be contributed at least partly by overuse of NSAIDs and salicylates (goody powdr)   Endometrial polyp 03/2003   s/p resection in 03/2003. Path showing submucosal leiomyoma and benign  proliferative type endometrium   Family history of premature CAD 05/19/2018   Fibroid uterus    s/p myomectomy x 2   GERD (gastroesophageal reflux disease)    H/O failed moderate sedation    Hiatal hernia    History of CVA (cerebrovascular accident) 10/2000   History of pineal cyst    11 mm cystic mass noted in the pineal gland per MRI in 2001 - most consitent with simple pineal cyst   HSV infection    Hyperlipidemia    Hypertension    Hypothyroidism    with hx of multinodular goiter (noted on neck US  in 11/2002 - showing diffuse nodularity and inhomogenous texture diffusely BL)   Internal hemorrhoids    grade 2 per colonoscopy in 09/2006 - repeat colonoscopy rec in 5-10 years.   Migraine    Nevus    Personal history of failed conscious sedation 08/11/2011   Sleep apnea  does not use cpap   Stroke (HCC) 10/2000   Tachycardia    Thyroid disease    Tobacco abuse Jan 2014   quit     Past Surgical History:   Past Surgical History:  Procedure Laterality Date   ADENOIDECTOMY     APPENDECTOMY  2017   BALLOON DILATION N/A 06/14/2020   Procedure: BALLOON DILATION;  Surgeon: Brice Campi Albino Alu., MD;  Location: [facility] ENDOSCOPY;  Service: Gastroenterology;  Laterality: N/A;   BIOPSY  02/15/2020   Procedure: BIOPSY;  Surgeon: Evangeline Hilts, MD;  Location: [facility] ENDOSCOPY;  Service: Endoscopy;;   BIOPSY  06/14/2020   Procedure: BIOPSY;  Surgeon: Normie Becton., MD;  Location: [facility] ENDOSCOPY;  Service:  Gastroenterology;;   BIOPSY  03/02/2023   Procedure: BIOPSY;  Surgeon: Genell Ken, MD;  Location: [facility] ENDOSCOPY;  Service: Gastroenterology;;   BRONCHIAL BIOPSY  03/15/2020   Procedure: BRONCHIAL BIOPSIES;  Surgeon: Prudy Brownie, DO;  Location: [facility] ENDOSCOPY;  Service: Pulmonary;;   BRONCHIAL BRUSHINGS  03/15/2020   Procedure: BRONCHIAL BRUSHINGS;  Surgeon: Prudy Brownie, DO;  Location: [facility] ENDOSCOPY;  Service: Pulmonary;;   BRONCHIAL NEEDLE ASPIRATION BIOPSY  03/15/2020   Procedure: BRONCHIAL NEEDLE ASPIRATION BIOPSIES;  Surgeon: Prudy Brownie, DO;  Location: [facility] ENDOSCOPY;  Service: Pulmonary;;   BRONCHIAL WASHINGS  03/15/2020   Procedure: BRONCHIAL WASHINGS;  Surgeon: Prudy Brownie, DO;  Location: [facility] ENDOSCOPY;  Service: Pulmonary;;   CHOLECYSTECTOMY     COLONOSCOPY  02/07/2010   COLONOSCOPY WITH PROPOFOL N/A 02/15/2020   Procedure: COLONOSCOPY WITH PROPOFOL;  Surgeon: Evangeline Hilts, MD;  Location: [facility] ENDOSCOPY;  Service: Endoscopy;  Laterality: N/A;   DILATION AND CURETTAGE OF UTERUS     ENDOSCOPIC MUCOSAL RESECTION N/A 06/14/2020   Procedure: ENDOSCOPIC MUCOSAL RESECTION;  Surgeon: Brice Campi Albino Alu., MD;  Location: [facility] ENDOSCOPY;  Service: Gastroenterology;  Laterality: N/A;   ESOPHAGOGASTRODUODENOSCOPY (EGD) WITH PROPOFOL N/A 02/15/2020   Procedure: ESOPHAGOGASTRODUODENOSCOPY (EGD) WITH PROPOFOL;  Surgeon: Evangeline Hilts, MD;  Location: [facility] ENDOSCOPY;  Service: Endoscopy;  Laterality: N/A;   ESOPHAGOGASTRODUODENOSCOPY (EGD) WITH PROPOFOL N/A 06/14/2020   Procedure: ESOPHAGOGASTRODUODENOSCOPY (EGD) WITH PROPOFOL;  Surgeon: Brice Campi Albino Alu., MD;  Location: [facility] ENDOSCOPY;  Service: Gastroenterology;  Laterality: N/A;   ESOPHAGOGASTRODUODENOSCOPY (EGD) WITH PROPOFOL Left 03/02/2023   Procedure: ESOPHAGOGASTRODUODENOSCOPY (EGD) WITH PROPOFOL;  Surgeon: Genell Ken, MD;  Location: [facility] ENDOSCOPY;  Service: Gastroenterology;  Laterality: Left;   FOREIGN BODY REMOVAL  06/14/2020    Procedure: FOREIGN BODY REMOVAL;  Surgeon: Brice Campi Albino Alu., MD;  Location: [facility] ENDOSCOPY;  Service: Gastroenterology;;   HYSTEROSCOPY     LAPAROSCOPIC APPENDECTOMY N/A 09/23/2015   Procedure: APPENDECTOMY LAPAROSCOPIC;  Surgeon: Dorena Gander, MD;  Location: [facility] OR;  Service: General;  Laterality: N/A;   MYOMECTOMY  1990, 1997   x 2 - In 1997, noted to have extensive pelvic adhesions and BL tubal obstruction   POLYPECTOMY  02/15/2020   Procedure: POLYPECTOMY;  Surgeon: Evangeline Hilts, MD;  Location: [facility] ENDOSCOPY;  Service: Endoscopy;;   TONSILLECTOMY     TRANSTHORACIC ECHOCARDIOGRAM  06/2014    EF 55-60%.  No RWMA. Gr 1 DD. Cannot R/o PFO (consider bubble study).   UPPER ESOPHAGEAL ENDOSCOPIC ULTRASOUND (EUS) N/A 06/14/2020   Procedure: UPPER ESOPHAGEAL ENDOSCOPIC ULTRASOUND (EUS);  Surgeon: Normie Becton., MD;  Location: [facility] ENDOSCOPY;  Service: Gastroenterology;  Laterality: N/A;   UPPER GASTROINTESTINAL ENDOSCOPY  12/18/2009   VIDEO BRONCHOSCOPY WITH ENDOBRONCHIAL NAVIGATION N/A 03/15/2020   Procedure: VIDEO BRONCHOSCOPY WITH ENDOBRONCHIAL NAVIGATION;  Surgeon: Prudy Brownie, DO;  Location: [facility] ENDOSCOPY;  Service: Pulmonary;  Laterality: N/A;   VIDEO BRONCHOSCOPY WITH ENDOBRONCHIAL ULTRASOUND  03/15/2020   Procedure: VIDEO BRONCHOSCOPY WITH ENDOBRONCHIAL ULTRASOUND;  Surgeon: Prudy Brownie, DO;  Location: [facility] ENDOSCOPY;  Service: Pulmonary;;   WISDOM TOOTH EXTRACTION       Past OB/GYN History:  [id number] Menopausal: Yes, at age 88 Contraception: N/a. Last pap smear was unknown.  Any history of abnormal pap smears: no. HM PAP   This patient has no relevant Health Maintenance data.     Medications: Patient has a current medication list which includes the following prescription(s): albuterol, aspirin ec, bisoprolol, breztri aerosphere, calcium carbonate, collagen-vitamin c, estradiol, ezetimibe, fluticasone, furosemide, glipizide, glucose blood, guaifenesin, hydralazine,  ipratropium-albuterol, isosorbide mononitrate, onetouch delica plus lancet33g, levothyroxine, losartan, pregabalin, repatha sureclick, nebulizer/tubing/mouthpiece, tramadol, valacyclovir, vibegron, iron polysaccharides, pantoprazole, and [DISCONTINUED] ipratropium.   Allergies: Patient is allergic to amlodipine, codeine, gabapentin, and prozac [fluoxetine hcl].   Social History:  Social History   Tobacco Use   Smoking status: Former    Current packs/day: 0.00    Average packs/day: 3.0 packs/day for 50.0 years (150.0 ttl pk-yrs)    Types: Cigarettes    Start date: 09/22/1965    Quit date: 09/23/2015    Years since quitting: 7.8   Smokeless tobacco: Never   Tobacco comments:    Counseling sheet 07-2011   Vaping Use   Vaping status: Never Used  Substance Use Topics   Alcohol use: No    Alcohol/week: 0.0 standard drinks of alcohol   Drug use: No    Relationship status: single Patient lives with alone.   Patient is not employed. Regular exercise: No History of abuse: No  Family History:   Family History  Problem Relation Age of Onset   COPD Mother    Heart disease Father 28       had 5 heart attacks, first in late 40s   COPD Father    Colon cancer Neg Hx    Esophageal cancer Neg Hx    Inflammatory bowel disease Neg Hx    Liver disease Neg Hx    Pancreatic cancer Neg Hx    Rectal cancer Neg Hx    Stomach cancer Neg Hx    Thyroid disease Neg Hx      Review of Systems: Review of Systems  Constitutional:  Positive for malaise/fatigue. Negative for chills and fever.       +Weight Gain  Respiratory:  Positive for shortness of breath. Negative for cough.   Cardiovascular:  Positive for leg swelling. Negative for chest pain and palpitations.  Gastrointestinal:  Negative for abdominal pain, blood in stool, constipation and diarrhea.  Skin:  Negative for rash.  Neurological:  Positive for dizziness, weakness and headaches.  Endo/Heme/Allergies:  Bruises/bleeds easily.   Psychiatric/Behavioral:  Negative for depression and suicidal ideas.      OBJECTIVE Physical Exam: Vitals:   07/24/23 1001  BP: 132/79  Pulse: 60  Weight: 196 lb 12.8 oz (89.3 kg)  Height: 5' 1.2" (1.554 m)    Physical Exam Vitals reviewed. Exam conducted with a chaperone present.  Constitutional:      Appearance: Normal appearance.  Pulmonary:     Effort: Pulmonary effort is normal.  Abdominal:     Palpations: Abdomen is soft.  Neurological:     General: No focal deficit present.     Mental Status: She is alert and oriented to person, place, and time.  Psychiatric:  Mood and Affect: Mood normal.        Behavior: Behavior normal. Behavior is cooperative.        Thought Content: Thought content normal.      GU / Detailed Urogynecologic Evaluation:  Pelvic Exam: Normal external female genitalia; Bartholin's and Skene's glands normal in appearance; urethral meatus normal in appearance, no urethral masses or discharge.   CST: negative  Speculum exam reveals normal vaginal mucosa with atrophy. Cervix normal appearance. Uterus normal single, nontender. Adnexa normal adnexa.     Pelvic floor strength I/V  Pelvic floor musculature: Right levator non-tender, Right obturator non-tender, Left levator non-tender, Left obturator non-tender  POP-Q:   POP-Q  -3                                            Aa   -3                                           Ba  -5.5                                              C   2                                            Gh  6.5                                            Pb  9.5                                            tvl   -3                                            Ap  -3                                            Bp  -8                                              D      Rectal Exam:  Normal external exam.   Post-Void Residual (PVR) by Bladder Scan: In order to evaluate bladder emptying, we discussed  obtaining a postvoid residual and patient agreed to this procedure.  Procedure: The ultrasound unit was placed on the patient's abdomen in the suprapubic region after the patient had voided.    Post Void Residual -  07/24/23 1014       Post Void Residual   Post Void Residual 26 mL              Laboratory Results: Lab Results  Component Value Date   COLORU Yellow 07/24/2023   CLARITYU Clear 07/24/2023   GLUCOSEUR Negative 07/24/2023   BILIRUBINUR Negative 07/24/2023   KETONESU Negative 07/24/2023   SPECGRAV 1.020 07/24/2023   RBCUR Negative 07/24/2023   PHUR 5.5 07/24/2023   PROTEINUR Negative 07/24/2023   UROBILINOGEN 0.2 07/24/2023   LEUKOCYTESUR Negative 07/24/2023    Lab Results  Component Value Date   CREATININE 0.73 03/02/2023   CREATININE 0.82 03/01/2023   CREATININE 0.74 02/28/2023    Lab Results  Component Value Date   HGBA1C 6.9 (H) 02/28/2023    Lab Results  Component Value Date   HGB 9.5 (L) 03/03/2023     ASSESSMENT AND PLAN Grace [name] is a 68 y.o. with:  1. OAB (overactive bladder)   2. Urge incontinence of urine   3. Slow transit constipation   4. Urinary frequency    We discussed the symptoms of overactive bladder (OAB), which include urinary urgency, urinary frequency, nocturia, with or without urge incontinence.  While we do not know the exact etiology of OAB, several treatment options exist. We discussed management including behavioral therapy (decreasing bladder irritants, urge suppression strategies, timed voids, bladder retraining), physical therapy, medication; for refractory cases posterior tibial nerve stimulation, sacral neuromodulation, and intravesical botulinum toxin injection. For anticholinergic medications, we discussed the potential side effects of anticholinergics including dry eyes, dry mouth, constipation, cognitive impairment and urinary retention.For Beta-3 agonist medication, we discussed the potential side effect of  elevated blood pressure which is more likely to occur in individuals with uncontrolled hypertension. Patient is over the age of 37 so would not suggest anticholinergic medications. She has previously tried and failed Myrbetriq 50mg . Will try Gemtesa 75mg  daily in combination with PTNS. If this is not successful patient may be interested in trying botox.  Patient would also be a good candidate for pelvic floor PT but she is unsure if that is something she would want to do at this time. Her pelvic floor strength is minimal and she may increase her core strength as well with PT.  Patient has both slow transit constipation and infrequent fecal smearing. She reports she has been trying to handle this with her diet. May suggest miralax and fiber supplementation moving forward.  Urine negative for signs of infection. Encouraged patient to let us  know if she has signs of a UTI so we can get culture done.    Patient to follow up for PTNS and medication follow up.    Grace Ludvigsen G Bradleigh Sonnen, NP

## 2023-07-24 NOTE — Patient Instructions (Signed)
 When you get the Leslye Peer stop the Myrbetriq and start the Marion.   Let's try to start some PTNS and consider pelvic floor PT.

## 2023-07-30 ENCOUNTER — Ambulatory Visit (INDEPENDENT_AMBULATORY_CARE_PROVIDER_SITE_OTHER): Admitting: Obstetrics and Gynecology

## 2023-07-30 ENCOUNTER — Encounter: Payer: Self-pay | Admitting: Obstetrics and Gynecology

## 2023-07-30 VITALS — BP 123/75 | HR 65

## 2023-07-30 DIAGNOSIS — N3281 Overactive bladder: Secondary | ICD-10-CM | POA: Diagnosis not present

## 2023-07-30 DIAGNOSIS — R350 Frequency of micturition: Secondary | ICD-10-CM

## 2023-07-30 DIAGNOSIS — K5901 Slow transit constipation: Secondary | ICD-10-CM

## 2023-07-30 DIAGNOSIS — N3941 Urge incontinence: Secondary | ICD-10-CM

## 2023-07-30 NOTE — Progress Notes (Signed)
 [facility]  PTNS VISIT  CC:  Overactive bladder  68 y.o. with refractory overactive bladder who presents for percutaneous tibial nerve stimulation. The patient presents for PTNS session # 1.   Procedure: The patient was placed in the sitting position and the right lower extremity was prepped in the usual fashion. The PTNS needle was then inserted at a 60 degree angle, 5 cm cephalad and 2 cm posterior to the medial malleolus. The PTNS unit was then programmed and an optimal response was noted at 10 milliamps. The PTNS stimulation was then performed at this setting for 30 minutes without incident and the patient tolerated the procedure well. The needle was removed and hemostasis was noted.   The pt will return in 1 week for PTNS session # 2. All questions were answered.

## 2023-08-05 ENCOUNTER — Ambulatory Visit: Attending: Obstetrics and Gynecology | Admitting: Physical Therapy

## 2023-08-05 ENCOUNTER — Other Ambulatory Visit: Payer: Self-pay

## 2023-08-05 DIAGNOSIS — R279 Unspecified lack of coordination: Secondary | ICD-10-CM | POA: Diagnosis present

## 2023-08-05 DIAGNOSIS — R293 Abnormal posture: Secondary | ICD-10-CM | POA: Diagnosis present

## 2023-08-05 DIAGNOSIS — M6281 Muscle weakness (generalized): Secondary | ICD-10-CM | POA: Diagnosis present

## 2023-08-05 DIAGNOSIS — M62838 Other muscle spasm: Secondary | ICD-10-CM | POA: Insufficient documentation

## 2023-08-05 DIAGNOSIS — R269 Unspecified abnormalities of gait and mobility: Secondary | ICD-10-CM | POA: Insufficient documentation

## 2023-08-05 NOTE — Patient Instructions (Signed)
Moisturizers They are used in the vagina to hydrate the mucous membrane that make up the vaginal canal. Designed to keep a more normal acid balance (ph) Once placed in the vagina, it will last between two to three days.  Use 2-3 times per week at bedtime  Ingredients to avoid is glycerin and fragrance, can increase chance of infection Should not be used just before sex due to causing irritation Most are gels administered either in a tampon-shaped applicator or as a vaginal suppository. They are non-hormonal.   Types of Moisturizers(internal use)  Vitamin E vaginal suppositories- Whole foods, Amazon Moist Again Coconut oil- can break down condoms, any grocery store (prefer organic) Julva- (Do no use if taking  Tamoxifen) amazon Yes moisturizer- amazon NeuEve Silk , NeuEve Silver for menopausal or over 65 (if have severe vaginal atrophy or cancer treatments use NeuEve Silk for  1 month than move to [employer])- [employer], Avilla.com Olive and Bee intimate cream- www.oliveandbee.com.au Mae vaginal moisturizer- Amazon Aloe Good Clean Love Hyaluronic acid Hyalofemme Reveree hyaluronic acid inserts   Creams to use externally on the Vulva area Marathon Oil (good for for cancer patients that had radiation to the area)- [place] or [employer].https://garcia-valdez.org/ Vulva Balm/ V-magic cream by medicine mama- amazon Julva-amazon Vital "V Wild Yam salve ( help moisturize and help with thinning vulvar area, does have Beeswax MoodMaid Botanical Pro-Meno Wild Yam Cream- Amazon Desert Harvest Gele Cleo by Zane Herald labial moisturizer (Amazon),  Coconut or olive oil aloe Good Clean Love Enchanted Rose by intimate rose  Things to avoid in the vaginal area Do not use things to irritate the vulvar area No lotions just specialized creams for the vulva area- Neogyn, V-magic,  No soaps; can use Aveeno or Calendula cleanser, unscented Dove if needed. Must be gentle No deodorants No douches Good to  sleep without underwear to let the vaginal area to air out No scrubbing: spread the lips to let warm water rinse over labias and pat dry

## 2023-08-05 NOTE — Therapy (Signed)
 OUTPATIENT PHYSICAL THERAPY FEMALE PELVIC EVALUATION   Patient Name: SHERRONDA SWEIGERT MRN: [id number] DOB:[birth year], 68 y.o., female Today's Date: 08/05/2023  END OF SESSION:  PT End of Session - 08/05/23 1402     Visit Number 1    Date for PT Re-Evaluation 11/04/23    Authorization Type MCR    Progress Note Due on Visit 10    PT Start Time 1401    PT Stop Time 1445    PT Time Calculation (min) 44 min    Activity Tolerance Patient tolerated treatment well    Behavior During Therapy [facility] for tasks assessed/performed             Past Medical History:  Diagnosis Date   Abnormal vaginal bleeding    Anxiety    Asthma    Barrett's esophagus    with high grade dysplasia per endoscopy 05/2005 // followed by Dr. Loy Ruff ([facility])   Chronic back pain    COPD (chronic obstructive pulmonary disease) (HCC)    Diabetes mellitus without complication (HCC)    type 2   Duodenal ulcer disease    thought to be contributed at least partly by overuse of NSAIDs and salicylates (goody powdr)   Endometrial polyp 03/2003   s/p resection in 03/2003. Path showing submucosal leiomyoma and benign  proliferative type endometrium   Family history of premature CAD 05/19/2018   Fibroid uterus    s/p myomectomy x 2   GERD (gastroesophageal reflux disease)    H/O failed moderate sedation    Hiatal hernia    History of CVA (cerebrovascular accident) 10/2000   History of pineal cyst    11 mm cystic mass noted in the pineal gland per MRI in 2001 - most consitent with simple pineal cyst   HSV infection    Hyperlipidemia    Hypertension    Hypothyroidism    with hx of multinodular goiter (noted on neck US  in 11/2002 - showing diffuse nodularity and inhomogenous texture diffusely BL)   Internal hemorrhoids    grade 2 per colonoscopy in 09/2006 - repeat colonoscopy rec in 5-10 years.   Migraine    Nevus    Personal history of failed conscious sedation 08/11/2011   Sleep apnea    does not use cpap    Stroke (HCC) 10/2000   Tachycardia    Thyroid  disease    Tobacco abuse Jan 2014   quit   Past Surgical History:  Procedure Laterality Date   ADENOIDECTOMY     APPENDECTOMY  2017   BALLOON DILATION N/A 06/14/2020   Procedure: BALLOON DILATION;  Surgeon: Brice Campi Albino Alu., MD;  Location: [facility] ENDOSCOPY;  Service: Gastroenterology;  Laterality: N/A;   BIOPSY  02/15/2020   Procedure: BIOPSY;  Surgeon: Evangeline Hilts, MD;  Location: [facility] ENDOSCOPY;  Service: Endoscopy;;   BIOPSY  06/14/2020   Procedure: BIOPSY;  Surgeon: Normie Becton., MD;  Location: [facility] ENDOSCOPY;  Service: Gastroenterology;;   BIOPSY  03/02/2023   Procedure: BIOPSY;  Surgeon: Genell Ken, MD;  Location: [facility] ENDOSCOPY;  Service: Gastroenterology;;   BRONCHIAL BIOPSY  03/15/2020   Procedure: BRONCHIAL BIOPSIES;  Surgeon: Prudy Brownie, DO;  Location: [facility] ENDOSCOPY;  Service: Pulmonary;;   BRONCHIAL BRUSHINGS  03/15/2020   Procedure: BRONCHIAL BRUSHINGS;  Surgeon: Prudy Brownie, DO;  Location: [facility] ENDOSCOPY;  Service: Pulmonary;;   BRONCHIAL NEEDLE ASPIRATION BIOPSY  03/15/2020   Procedure: BRONCHIAL NEEDLE ASPIRATION BIOPSIES;  Surgeon: Prudy Brownie, DO;  Location: [facility] ENDOSCOPY;  Service: Pulmonary;;   BRONCHIAL WASHINGS  03/15/2020   Procedure: BRONCHIAL WASHINGS;  Surgeon: Prudy Brownie, DO;  Location: [facility] ENDOSCOPY;  Service: Pulmonary;;   CHOLECYSTECTOMY     COLONOSCOPY  02/07/2010   COLONOSCOPY WITH PROPOFOL  N/A 02/15/2020   Procedure: COLONOSCOPY WITH PROPOFOL ;  Surgeon: Evangeline Hilts, MD;  Location: [facility] ENDOSCOPY;  Service: Endoscopy;  Laterality: N/A;   DILATION AND CURETTAGE OF UTERUS     ENDOSCOPIC MUCOSAL RESECTION N/A 06/14/2020   Procedure: ENDOSCOPIC MUCOSAL RESECTION;  Surgeon: Brice Campi Albino Alu., MD;  Location: [facility] ENDOSCOPY;  Service: Gastroenterology;  Laterality: N/A;   ESOPHAGOGASTRODUODENOSCOPY (EGD) WITH PROPOFOL  N/A 02/15/2020   Procedure: ESOPHAGOGASTRODUODENOSCOPY (EGD) WITH  PROPOFOL ;  Surgeon: Evangeline Hilts, MD;  Location: [facility] ENDOSCOPY;  Service: Endoscopy;  Laterality: N/A;   ESOPHAGOGASTRODUODENOSCOPY (EGD) WITH PROPOFOL  N/A 06/14/2020   Procedure: ESOPHAGOGASTRODUODENOSCOPY (EGD) WITH PROPOFOL ;  Surgeon: Brice Campi Albino Alu., MD;  Location: [facility] ENDOSCOPY;  Service: Gastroenterology;  Laterality: N/A;   ESOPHAGOGASTRODUODENOSCOPY (EGD) WITH PROPOFOL  Left 03/02/2023   Procedure: ESOPHAGOGASTRODUODENOSCOPY (EGD) WITH PROPOFOL ;  Surgeon: Genell Ken, MD;  Location: [facility] ENDOSCOPY;  Service: Gastroenterology;  Laterality: Left;   FOREIGN BODY REMOVAL  06/14/2020   Procedure: FOREIGN BODY REMOVAL;  Surgeon: Brice Campi Albino Alu., MD;  Location: [facility] ENDOSCOPY;  Service: Gastroenterology;;   HYSTEROSCOPY     LAPAROSCOPIC APPENDECTOMY N/A 09/23/2015   Procedure: APPENDECTOMY LAPAROSCOPIC;  Surgeon: Dorena Gander, MD;  Location: [facility] OR;  Service: General;  Laterality: N/A;   MYOMECTOMY  1990, 1997   x 2 - In 1997, noted to have extensive pelvic adhesions and BL tubal obstruction   POLYPECTOMY  02/15/2020   Procedure: POLYPECTOMY;  Surgeon: Evangeline Hilts, MD;  Location: [facility] ENDOSCOPY;  Service: Endoscopy;;   TONSILLECTOMY     TRANSTHORACIC ECHOCARDIOGRAM  06/2014    EF 55-60%.  No RWMA. Gr 1 DD. Cannot R/o PFO (consider bubble study).   UPPER ESOPHAGEAL ENDOSCOPIC ULTRASOUND (EUS) N/A 06/14/2020   Procedure: UPPER ESOPHAGEAL ENDOSCOPIC ULTRASOUND (EUS);  Surgeon: Normie Becton., MD;  Location: [facility] ENDOSCOPY;  Service: Gastroenterology;  Laterality: N/A;   UPPER GASTROINTESTINAL ENDOSCOPY  12/18/2009   VIDEO BRONCHOSCOPY WITH ENDOBRONCHIAL NAVIGATION N/A 03/15/2020   Procedure: VIDEO BRONCHOSCOPY WITH ENDOBRONCHIAL NAVIGATION;  Surgeon: Prudy Brownie, DO;  Location: [facility] ENDOSCOPY;  Service: Pulmonary;  Laterality: N/A;   VIDEO BRONCHOSCOPY WITH ENDOBRONCHIAL ULTRASOUND  03/15/2020   Procedure: VIDEO BRONCHOSCOPY WITH ENDOBRONCHIAL ULTRASOUND;  Surgeon: Prudy Brownie,  DO;  Location: [facility] ENDOSCOPY;  Service: Pulmonary;;   WISDOM TOOTH EXTRACTION     Patient Active Problem List   Diagnosis Date Noted   Severe anemia 02/28/2023   Bilateral lower extremity edema 02/27/2023   Acute sinusitis 01/09/2023   Statin myopathy 10/15/2022   Multinodular goiter 11/29/2020   Gastroesophageal reflux disease with esophagitis without hemorrhage 06/01/2020   History of peptic ulcer disease 06/01/2020   Duodenal stricture 06/01/2020   Dysphagia 06/01/2020   Abnormal findings on esophagogastroduodenoscopy (EGD) 06/01/2020   Coronary artery calcification seen on CAT scan 03/23/2020   OSA on CPAP 01/04/2020   HSV (herpes simplex virus) infection 11/09/2019   Class 2 severe obesity due to excess calories with serious comorbidity and body mass index (BMI) of 37.0 to 37.9 in adult (HCC) 06/27/2019   Ganglion cyst of tendon sheath of right hand 04/28/2017   COPD, severe (HCC) 05/21/2016   Neuropathy of hand 02/15/2016   Type 2 diabetes mellitus without complication, without long-term current use of insulin  (HCC) 01/13/2015   Insomnia 01/13/2015  Ovarian cyst, right 08/21/2014   Endometrial polyp 08/21/2014   Intramural leiomyoma of uterus 08/21/2014   PFO (patent foramen ovale) 08/17/2014   History of cervical dysplasia 07/31/2014   Fibroid, uterine 07/31/2014   History of cervical polypectomy 07/31/2014   Chronic low back pain 11/13/2011   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 08/11/2011   History of pineal cyst    Hypothyroidism    Former heavy tobacco smoker 06/12/2011   History of CVA (cerebrovascular accident) 04/25/2011   Migraine without aura and without status migrainosus, not intractable 01/29/2010   Hyperlipidemia LDL goal <70 02/12/2009   Essential hypertension 01/10/2009   Barrett's esophagus with dysplasia 05/28/2005    PCP: Carolyn Cisco, NP   REFERRING PROVIDER: Zuleta, Kaitlin G, NP   REFERRING DIAG: N32.81 (ICD-10-CM) -  OAB (overactive bladder) N39.41 (ICD-10-CM) - Urge incontinence of urine K59.01 (ICD-10-CM) - Slow transit constipation R35.0 (ICD-10-CM) - Urinary frequency  THERAPY DIAG:  Muscle weakness (generalized) - Plan: PT plan of care cert/re-cert  Unspecified lack of coordination - Plan: PT plan of care cert/re-cert  Abnormal posture - Plan: PT plan of care cert/re-cert  Other muscle spasm - Plan: PT plan of care cert/re-cert  Abnormality of gait and mobility - Plan: PT plan of care cert/re-cert  Rationale for Evaluation and Treatment: Rehabilitation  ONSET DATE: several years  SUBJECTIVE:                                                                                                                                                                                           SUBJECTIVE STATEMENT: I urinate a lot, 2 hours, 1-2x nightly. Does have dribbling or urine sometimes.    Fluid intake: water - 8 oz bottles (8x day), a soda for dinner   PAIN:  Are you having pain? Yes NPRS scale: 9/10 Pain location:  low back; head  Pain type: aching Pain description: constant, sharp, and burning   Aggravating factors: waking first thing usually has a headache. Walking, housework, lifting, standing around 5 mins then back back starts Relieving factors: nothing  PRECAUTIONS: None  RED FLAGS: None   WEIGHT BEARING RESTRICTIONS: No  FALLS:  Has patient fallen in last 6 months? No  OCCUPATION: not currently   ACTIVITY LEVEL : low  PLOF: Independent  PATIENT GOALS: to have less pain and less urine frequency and leakage  PERTINENT HISTORY:  Anxiety, Abnormal vaginal bleeding, COPD, Diabetes mellitus without complication type 2, Endometrial polyp, Fibroid uterus, CVA, HSV,  Internal hemorrhoids, Tachycardia, HYSTEROSCOPY, Ovarian cyst, right,  Sexual abuse: No  BOWEL MOVEMENT: Pain with bowel movement: No Type of bowel  movement:Type (Bristol Stool Scale) 1-2, Frequency every other  day, and Strain yes Fully empty rectum: No Leakage: No Pads: No Fiber supplement/laxative Yes stool softener 6-9 daily  URINATION: Pain with urination: No Fully empty bladder: Yes:   Stream: Strong and Weak Urgency: Yes  Frequency: 7-8x daily; 1-2x nightly Leakage:  unsure what causes it  Pads: Yes: 3 pads  INTERCOURSE:  Ability to have vaginal penetration Yes  Pain with intercourse: Initial Penetration, During Penetration, and Deep Penetration Dryness yes  Climax: difficult to achieve Marinoff Scale: 1/3 Lubricant - no uses estrogen    PREGNANCY: Vaginal deliveries 0 C-section deliveries 0 Currently pregnant No  PROLAPSE: Pressure - hard bowel movements    OBJECTIVE:  Note: Objective measures were completed at Evaluation unless otherwise noted.  DIAGNOSTIC FINDINGS:    PATIENT SURVEYS:    PFIQ-7: summary score 250 (UIQ-7 86,CRAIQ-7 81, POPIQ -7 86)  COGNITION: Overall cognitive status: Within functional limits for tasks assessed     SENSATION: Light touch: Appears intact  LUMBAR SPECIAL TESTS:  Single leg stance test: 1s on Rt, 3s Lt LOB noted immediately, SI Compression/distraction test: Negative, and FABER test: Negative  FUNCTIONAL TESTS:  Functional squat - forward trunk flexion, limited descent by 50%, bil knee valgus, and need of hand at thighs to return upright  Sit up test- 0/3  GAIT: Assistive device utilized: None Comments: decreased cadence, trunk flexion, poor arm swings, decreased step height bil and decreased stride length  POSTURE: rounded shoulders, forward head, and posterior pelvic tilt   LUMBARAROM/PROM:  A/PROM A/PROM  eval  Flexion Limited by 25%  Extension WFL  Right lateral flexion Limited by 50%  Left lateral flexion Limited by 50%  Right rotation Limited by 50%  Left rotation Limited by 50%   (Blank rows = not tested)  LOWER EXTREMITY ROM:  Bil hamstrings, adductors, hip IR, hip ER limited by 25%   LOWER  EXTREMITY MMT:  Bil hips grossly 3/5, knees 4/5 PALPATION:   General: TTP at bil piriformis, tight bil thoracic and lumbar paraspinals  Pelvic Alignment: WFL  Abdominal: TTP and tension throughout upper and lower abdominal quadrants                External Perineal Exam: dryness and redness noted in vulva, poor clitoral hood mobility with adhesion noted                             Internal Pelvic Floor: TTP throughout bil superficial and deep pelvic floor layers, poor tissue mobility throughout   Patient confirms identification and approves PT to assess internal pelvic floor and treatment Yes  PELVIC MMT:   MMT eval  Vaginal 2/5; 3s; 5 reps  Internal Anal Sphincter   External Anal Sphincter   Puborectalis   Diastasis Recti Bulging noted with sit up test but unable to palpate borders fully   (Blank rows = not tested)        TONE: Increased   PROLAPSE: Not seen in hooklying   TODAY'S TREATMENT:  DATE:   08/05/2023 EVAL Examination completed, findings reviewed, pt educated on POC, HEP, and feminine moisturizers. Pt motivated to participate in PT and agreeable to attempt recommendations.     PATIENT EDUCATION:  Education details: [email or web address]; Person educated: Patient Education method: Explanation, Demonstration, Tactile cues, Verbal cues, and Handouts Education comprehension: verbalized understanding, returned demonstration, verbal cues required, tactile cues required, and needs further education  HOME EXERCISE PROGRAM: Access Code: [email or web address] URL: [email or web address] Date: 08/05/2023 Prepared by: [facility]  Exercises - Supine Diaphragmatic Breathing  - 1 x daily - 7 x weekly - 1 sets - 10 reps - Supine Lower Trunk Rotation  - 1 x daily - 7 x weekly - 1 sets - 3 reps - 30s holds - Supine  Butterfly Groin Stretch  - 1 x daily - 7 x weekly - 3 sets - 30s hold - Standing L  - 1 x daily - 7 x weekly - 1 sets - 3 reps - 30s holds  ASSESSMENT:  CLINICAL IMPRESSION: Patient is a 68 y.o. female  who was seen today for physical therapy evaluation and treatment for OAB, urinary incontinence, constipation, and increased urinary frequency. Pt found to have decreased flexibility at spine, hips, decreased core and hip strength, 0/3 sit up test, fascial restrictions in abdomen with TTP, tightness in paraspinals, back pain with all LE MMTs and trunk mobility screen in all directions. Pt did report dizziness with rotation on mat table and transferring to sit up, resolved with just a few seconds of focused, eyes open. Patient consented to internal pelvic floor assessment vaginally this date and found to have decreased strength, endurance, and coordination. Patient benefited from verbal cues for improved technique with pelvic floor contractions and breathing coordination. Pt demonstrated decreased mobility of pelvic floor in all directions and in superficial and deep layers. Pt had TTP throughout as well. Reports prior to current boyfriend of 4 years and was not sexually active for 20 years and does have pain with penetration and intercourse. Pt would benefit from additional PT to further address deficits.       OBJECTIVE IMPAIRMENTS: decreased activity tolerance, decreased balance, decreased coordination, decreased endurance, decreased knowledge of condition, decreased mobility, difficulty walking, decreased ROM, decreased strength, dizziness, hypomobility, increased fascial restrictions, impaired perceived functional ability, increased muscle spasms, impaired flexibility, improper body mechanics, postural dysfunction, and pain.   ACTIVITY LIMITATIONS: carrying, lifting, squatting, stairs, continence, and locomotion level  PARTICIPATION LIMITATIONS: meal prep, cleaning, laundry, interpersonal relationship,  community activity, and yard work  PERSONAL FACTORS: Age, Fitness, Past/current experiences, Time since onset of injury/illness/exacerbation, and 1 comorbidity: medical history  are also affecting patient's functional outcome.   REHAB POTENTIAL: Good  CLINICAL DECISION MAKING: Stable/uncomplicated  EVALUATION COMPLEXITY: Low   GOALS: Goals reviewed with patient? Yes  SHORT TERM GOALS: Target date: 08/03/23  Pt to be I with HEP for carry over and continuing recommendations for improved outcomes.   Baseline: Goal status: INITIAL  2.  Pt will be independent with the knack, urge suppression technique, and double voiding in order to improve bladder habits and decrease urinary incontinence.   Baseline:  Goal status: INITIAL  3.  Pt will be independent with use of squatty potty, relaxed toileting mechanics, and improved bowel movement techniques in order to increase ease of bowel movements and complete evacuation for health bowel habits.  Baseline:  Goal status: INITIAL  4.  Pt to demonstrate at least 4/5 bil hip strength for improved  pelvic stability and functional squats without pain for I with transferring on/off the toilet.  Baseline:  Goal status: INITIAL  5.  Pt to be I with mobility of pelvic floor with contract/relax/contract/bulge for decreased tension at pelvic floor and improved tolerance for vaginal penetration.  Baseline:  Goal status: INITIAL   LONG TERM GOALS: Target date: 11/04/23  Pt to be I with advanced HEP for carry over and continuing recommendations for improved outcomes.   Baseline:  Goal status: INITIAL  2.  Pt will have 50% less urgency due to bladder retraining and strengthening for confidence with leaving the house for community outings.  Baseline:  Goal status: INITIAL  3. Pt to demonstrate improved coordination of pelvic floor and breathing mechanics with 10# squat with appropriate synergistic patterns to decrease pain and leakage at least 75% of the  time for improved ability to complete a 30 minute walk without strain at pelvic floor and symptoms.    Baseline:  Goal status: INITIAL  4.  Pt will report 3 BMs per week due to improved muscle tone and coordination with bowel movements for healthy bowel habits.  Baseline:  Goal status: INITIAL  5.  Pt to report no more than 2/10 pain with vaginal dilator size 6 or equivalent for improved tolerance to intercourse.  Baseline:  Goal status: INITIAL  6.  Pt to report improved time between bladder voids to at least 3 hours for improved QOL with decreased urinary frequency.   Baseline:  Goal status: INITIAL  PLAN:  PT FREQUENCY: 2x/week  PT DURATION:  8 sessions  PLANNED INTERVENTIONS: 97110-Therapeutic exercises, 97530- Therapeutic activity, 97112- Neuromuscular re-education, 97535- Self Care, 58527- Manual therapy, [phone]- Aquatic Therapy, [phone]- Electrical stimulation (manual), [id number]- Vasopneumatic device, Patient/Family education, Taping, Dry Needling, Joint mobilization, Spinal mobilization, Scar mobilization, Vestibular training, DME instructions, Cryotherapy, Moist heat, and Biofeedback  PLAN FOR NEXT SESSION: internal as needed; hips and back and abdomen stretching, hip and core strengthening, urge drill, voiding mechanics, abdominal massage   Avie Lemme, PT, DPT 04/23/257:39 PM

## 2023-08-06 ENCOUNTER — Ambulatory Visit (INDEPENDENT_AMBULATORY_CARE_PROVIDER_SITE_OTHER): Admitting: Obstetrics and Gynecology

## 2023-08-06 ENCOUNTER — Encounter: Payer: Self-pay | Admitting: Obstetrics and Gynecology

## 2023-08-06 VITALS — BP 120/54 | HR 69

## 2023-08-06 DIAGNOSIS — N3281 Overactive bladder: Secondary | ICD-10-CM

## 2023-08-06 DIAGNOSIS — N3941 Urge incontinence: Secondary | ICD-10-CM

## 2023-08-06 NOTE — Progress Notes (Signed)
 [facility]  PTNS VISIT  CC:  Overactive bladder  68 y.o. with refractory overactive bladder who presents for percutaneous tibial nerve stimulation. The patient presents for PTNS session # 2.   Procedure: The patient was placed in the sitting position and the right lower extremity was prepped in the usual fashion. The PTNS needle was then inserted at a 60 degree angle, 5 cm cephalad and 2 cm posterior to the medial malleolus. The PTNS unit was then programmed and an optimal response was noted at 3 milliamps. The PTNS stimulation was then performed at this setting for 30 minutes without incident and the patient tolerated the procedure well. The needle was removed and hemostasis was noted.   The pt will return in 1 week for PTNS session # 3. All questions were answered.

## 2023-08-06 NOTE — Patient Instructions (Signed)
 Consider looking into the [employer] supplement and see if you think it would be beneficial for you?

## 2023-08-13 ENCOUNTER — Encounter: Payer: Self-pay | Admitting: Obstetrics and Gynecology

## 2023-08-13 ENCOUNTER — Other Ambulatory Visit: Payer: Self-pay | Admitting: Obstetrics and Gynecology

## 2023-08-13 ENCOUNTER — Ambulatory Visit: Admitting: Obstetrics and Gynecology

## 2023-08-13 ENCOUNTER — Other Ambulatory Visit (HOSPITAL_COMMUNITY)
Admission: RE | Admit: 2023-08-13 | Discharge: 2023-08-13 | Disposition: A | Discharge status: Home / Self Care | Source: Ambulatory Visit | Attending: Obstetrics and Gynecology | Admitting: Obstetrics and Gynecology

## 2023-08-13 VITALS — BP 115/74 | HR 71

## 2023-08-13 DIAGNOSIS — N3281 Overactive bladder: Secondary | ICD-10-CM

## 2023-08-13 DIAGNOSIS — Z124 Encounter for screening for malignant neoplasm of cervix: Secondary | ICD-10-CM | POA: Diagnosis not present

## 2023-08-13 DIAGNOSIS — N950 Postmenopausal bleeding: Secondary | ICD-10-CM

## 2023-08-13 DIAGNOSIS — Z1231 Encounter for screening mammogram for malignant neoplasm of breast: Secondary | ICD-10-CM

## 2023-08-13 NOTE — Progress Notes (Signed)
 [facility] Return Visit  SUBJECTIVE  History of Present Illness: Grace [name] is a 68 y.o. female seen in follow-up for OAB. Patient also reports she has been having episodes of postmenopausal bleeding. She reports for the last 2-3 months she has had some speckled blood and small blood. Denies pain.     Past Medical History: Patient  has a past medical history of Abnormal vaginal bleeding, Anxiety, Asthma, Barrett's esophagus, Chronic back pain, COPD (chronic obstructive pulmonary disease) (HCC), Diabetes mellitus without complication (HCC), Duodenal ulcer disease, Endometrial polyp (03/2003), Family history of premature CAD (05/19/2018), Fibroid uterus, GERD (gastroesophageal reflux disease), H/O failed moderate sedation, Hiatal hernia, History of CVA (cerebrovascular accident) (10/2000), History of pineal cyst, HSV infection, Hyperlipidemia, Hypertension, Hypothyroidism, Internal hemorrhoids, Migraine, Nevus, Personal history of failed conscious sedation (08/11/2011), Sleep apnea, Stroke (HCC) (10/2000), Tachycardia, Thyroid  disease, and Tobacco abuse (Jan 2014).   Past Surgical History: She  has a past surgical history that includes Cholecystectomy; Myomectomy (1990, 1997); Colonoscopy (02/07/2010); Upper gastrointestinal endoscopy (12/18/2009); Hysteroscopy; laparoscopic appendectomy (N/A, 09/23/2015); transthoracic echocardiogram (06/2014); Esophagogastroduodenoscopy (egd) with propofol  (N/A, 02/15/2020); Colonoscopy with propofol  (N/A, 02/15/2020); biopsy (02/15/2020); polypectomy (02/15/2020); Dilation and curettage of uterus; Appendectomy (2017); Video bronchoscopy with endobronchial navigation (N/A, 03/15/2020); Video bronchoscopy with endobronchial ultrasound (03/15/2020); Bronchial brushings (03/15/2020); Bronchial biopsy (03/15/2020); Bronchial needle aspiration biopsy (03/15/2020); Bronchial washings (03/15/2020); Wisdom tooth extraction; Tonsillectomy; Adenoidectomy;  Esophagogastroduodenoscopy (egd) with propofol  (N/A, 06/14/2020); Upper esophageal endoscopic ultrasound (eus) (N/A, 06/14/2020); Endoscopic mucosal resection (N/A, 06/14/2020); Foreign Body Removal (06/14/2020); biopsy (06/14/2020); Balloon dilation (N/A, 06/14/2020); Esophagogastroduodenoscopy (egd) with propofol  (Left, 03/02/2023); and biopsy (03/02/2023).   Medications: She has a current medication list which includes the following prescription(s): albuterol , aspirin  ec, bisoprolol , breztri  aerosphere, calcium  carbonate, collagen-vitamin c, estradiol , ezetimibe , fluticasone , furosemide , glipizide , glucose blood, guaifenesin , hydralazine , ipratropium-albuterol , isosorbide  mononitrate, onetouch delica plus lancet33g, levothyroxine , losartan , pregabalin , repatha  sureclick, nebulizer/tubing/mouthpiece, tramadol , valacyclovir , vibegron , iron  polysaccharides, pantoprazole , and [DISCONTINUED] ipratropium.   Allergies: Patient is allergic to amlodipine , codeine, gabapentin , and prozac [fluoxetine hcl].   Social History: Patient  reports that she quit smoking about 7 years ago. Her smoking use included cigarettes. She started smoking about 57 years ago. She has a 150 pack-year smoking history. She has never used smokeless tobacco. She reports that she does not drink alcohol and does not use drugs.     OBJECTIVE     Physical Exam: Vitals:   08/13/23 1311  BP: 115/74  Pulse: 71   Gen: No apparent distress, A&O x 3.  Detailed Urogynecologic Evaluation:  Attempted an endometrial biopsy, but was unable to pass through the cervical Os. She is nulliparous so stenosis of cervical os is not out of the question.   Pap smear obtained for patient as she has not had one in 15 years.    ASSESSMENT AND PLAN    Grace [name] is a 68 y.o. with:  1. Post-menopausal bleeding   2. OAB (overactive bladder)     Post-menopausal bleeding -     US  PELVIC COMPLETE WITH TRANSVAGINAL; Future -Attempted endometrial biopsy  without success, will await US  results before we try again or may need to send to OBGYN.   OAB (overactive bladder)  Patient is continuing Pelvic floor PT and we did another PTNS today.    PTNS VISIT  CC:  Overactive bladder  68 y.o. with refractory overactive bladder who presents for percutaneous tibial nerve stimulation. The patient presents for PTNS session # 3.   Procedure: The patient was placed in  the sitting position and the right lower extremity was prepped in the usual fashion. The PTNS needle was then inserted at a 60 degree angle, 5 cm cephalad and 2 cm posterior to the medial malleolus. The PTNS unit was then programmed and an optimal response was noted at 12 milliamps. The PTNS stimulation was then performed at this setting for 30 minutes without incident and the patient tolerated the procedure well. The needle was removed and hemostasis was noted.   The pt will return in 1 week for PTNS session # 4. All questions were answered.    Reino Lybbert G Ajahni Nay, NP

## 2023-08-14 ENCOUNTER — Encounter: Payer: Self-pay | Admitting: Physical Therapy

## 2023-08-14 ENCOUNTER — Ambulatory Visit: Attending: Obstetrics and Gynecology | Admitting: Physical Therapy

## 2023-08-14 DIAGNOSIS — M6281 Muscle weakness (generalized): Secondary | ICD-10-CM | POA: Insufficient documentation

## 2023-08-14 DIAGNOSIS — R293 Abnormal posture: Secondary | ICD-10-CM | POA: Insufficient documentation

## 2023-08-14 DIAGNOSIS — R279 Unspecified lack of coordination: Secondary | ICD-10-CM | POA: Insufficient documentation

## 2023-08-14 DIAGNOSIS — M62838 Other muscle spasm: Secondary | ICD-10-CM | POA: Insufficient documentation

## 2023-08-14 DIAGNOSIS — R269 Unspecified abnormalities of gait and mobility: Secondary | ICD-10-CM | POA: Diagnosis present

## 2023-08-14 NOTE — Therapy (Signed)
 OUTPATIENT PHYSICAL THERAPY FEMALE PELVIC TREATMENT   Patient Name: Grace [name] MRN: [id number] DOB:21-Mar-1956, 68 y.o., female Today's Date: 08/14/2023  END OF SESSION:  PT End of Session - 08/14/23 1054     Visit Number 2    Date for PT Re-Evaluation 11/04/23    Authorization Type MCR    Progress Note Due on Visit 10    PT Start Time 1020    PT Stop Time 1100    PT Time Calculation (min) 40 min    Activity Tolerance Patient tolerated treatment well    Behavior During Therapy [facility] for tasks assessed/performed              Past Medical History:  Diagnosis Date   Abnormal vaginal bleeding    Anxiety    Asthma    Barrett's esophagus    with high grade dysplasia per endoscopy 05/2005 // followed by Dr. Loy Ruff ([facility])   Chronic back pain    COPD (chronic obstructive pulmonary disease) (HCC)    Diabetes mellitus without complication (HCC)    type 2   Duodenal ulcer disease    thought to be contributed at least partly by overuse of NSAIDs and salicylates (goody powdr)   Endometrial polyp 03/2003   s/p resection in 03/2003. Path showing submucosal leiomyoma and benign  proliferative type endometrium   Family history of premature CAD 05/19/2018   Fibroid uterus    s/p myomectomy x 2   GERD (gastroesophageal reflux disease)    H/O failed moderate sedation    Hiatal hernia    History of CVA (cerebrovascular accident) 10/2000   History of pineal cyst    11 mm cystic mass noted in the pineal gland per MRI in 2001 - most consitent with simple pineal cyst   HSV infection    Hyperlipidemia    Hypertension    Hypothyroidism    with hx of multinodular goiter (noted on neck US  in 11/2002 - showing diffuse nodularity and inhomogenous texture diffusely BL)   Internal hemorrhoids    grade 2 per colonoscopy in 09/2006 - repeat colonoscopy rec in 5-10 years.   Migraine    Nevus    Personal history of failed conscious sedation 08/11/2011   Sleep apnea    does not use cpap    Stroke (HCC) 10/2000   Tachycardia    Thyroid  disease    Tobacco abuse Jan 2014   quit   Past Surgical History:  Procedure Laterality Date   ADENOIDECTOMY     APPENDECTOMY  2017   BALLOON DILATION N/A 06/14/2020   Procedure: BALLOON DILATION;  Surgeon: Brice Campi Albino Alu., MD;  Location: [facility] ENDOSCOPY;  Service: Gastroenterology;  Laterality: N/A;   BIOPSY  02/15/2020   Procedure: BIOPSY;  Surgeon: Evangeline Hilts, MD;  Location: [facility] ENDOSCOPY;  Service: Endoscopy;;   BIOPSY  06/14/2020   Procedure: BIOPSY;  Surgeon: Normie Becton., MD;  Location: [facility] ENDOSCOPY;  Service: Gastroenterology;;   BIOPSY  03/02/2023   Procedure: BIOPSY;  Surgeon: Genell Ken, MD;  Location: [facility] ENDOSCOPY;  Service: Gastroenterology;;   BRONCHIAL BIOPSY  03/15/2020   Procedure: BRONCHIAL BIOPSIES;  Surgeon: Prudy Brownie, DO;  Location: [facility] ENDOSCOPY;  Service: Pulmonary;;   BRONCHIAL BRUSHINGS  03/15/2020   Procedure: BRONCHIAL BRUSHINGS;  Surgeon: Prudy Brownie, DO;  Location: [facility] ENDOSCOPY;  Service: Pulmonary;;   BRONCHIAL NEEDLE ASPIRATION BIOPSY  03/15/2020   Procedure: BRONCHIAL NEEDLE ASPIRATION BIOPSIES;  Surgeon: Prudy Brownie, DO;  Location: [facility]  ENDOSCOPY;  Service: Pulmonary;;   BRONCHIAL WASHINGS  03/15/2020   Procedure: BRONCHIAL WASHINGS;  Surgeon: Prudy Brownie, DO;  Location: [facility] ENDOSCOPY;  Service: Pulmonary;;   CHOLECYSTECTOMY     COLONOSCOPY  02/07/2010   COLONOSCOPY WITH PROPOFOL  N/A 02/15/2020   Procedure: COLONOSCOPY WITH PROPOFOL ;  Surgeon: Evangeline Hilts, MD;  Location: [facility] ENDOSCOPY;  Service: Endoscopy;  Laterality: N/A;   DILATION AND CURETTAGE OF UTERUS     ENDOSCOPIC MUCOSAL RESECTION N/A 06/14/2020   Procedure: ENDOSCOPIC MUCOSAL RESECTION;  Surgeon: Brice Campi Albino Alu., MD;  Location: [facility] ENDOSCOPY;  Service: Gastroenterology;  Laterality: N/A;   ESOPHAGOGASTRODUODENOSCOPY (EGD) WITH PROPOFOL  N/A 02/15/2020   Procedure: ESOPHAGOGASTRODUODENOSCOPY (EGD) WITH  PROPOFOL ;  Surgeon: Evangeline Hilts, MD;  Location: [facility] ENDOSCOPY;  Service: Endoscopy;  Laterality: N/A;   ESOPHAGOGASTRODUODENOSCOPY (EGD) WITH PROPOFOL  N/A 06/14/2020   Procedure: ESOPHAGOGASTRODUODENOSCOPY (EGD) WITH PROPOFOL ;  Surgeon: Brice Campi Albino Alu., MD;  Location: [facility] ENDOSCOPY;  Service: Gastroenterology;  Laterality: N/A;   ESOPHAGOGASTRODUODENOSCOPY (EGD) WITH PROPOFOL  Left 03/02/2023   Procedure: ESOPHAGOGASTRODUODENOSCOPY (EGD) WITH PROPOFOL ;  Surgeon: Genell Ken, MD;  Location: [facility] ENDOSCOPY;  Service: Gastroenterology;  Laterality: Left;   FOREIGN BODY REMOVAL  06/14/2020   Procedure: FOREIGN BODY REMOVAL;  Surgeon: Brice Campi Albino Alu., MD;  Location: [facility] ENDOSCOPY;  Service: Gastroenterology;;   HYSTEROSCOPY     LAPAROSCOPIC APPENDECTOMY N/A 09/23/2015   Procedure: APPENDECTOMY LAPAROSCOPIC;  Surgeon: Dorena Gander, MD;  Location[facility] OR;  Service: General;  Laterality: N/A;   MYOMECTOMY  1990, 1997   x 2 - In 1997, noted to have extensive pelvic adhesions and BL tubal obstruction   POLYPECTOMY  02/15/2020   Procedure: POLYPECTOMY;  Surgeon: Evangeline Hilts, MD;  Location: [facility] ENDOSCOPY;  Service: Endoscopy;;   TONSILLECTOMY     TRANSTHORACIC ECHOCARDIOGRAM  06/2014    EF 55-60%.  No RWMA. Gr 1 DD. Cannot R/o PFO (consider bubble study).   UPPER ESOPHAGEAL ENDOSCOPIC ULTRASOUND (EUS) N/A 06/14/2020   Procedure: UPPER ESOPHAGEAL ENDOSCOPIC ULTRASOUND (EUS);  Surgeon: Normie Becton., MD;  Location: [facility] ENDOSCOPY;  Service: Gastroenterology;  Laterality: N/A;   UPPER GASTROINTESTINAL ENDOSCOPY  12/18/2009   VIDEO BRONCHOSCOPY WITH ENDOBRONCHIAL NAVIGATION N/A 03/15/2020   Procedure: VIDEO BRONCHOSCOPY WITH ENDOBRONCHIAL NAVIGATION;  Surgeon: Prudy Brownie, DO;  Location: [facility] ENDOSCOPY;  Service: Pulmonary;  Laterality: N/A;   VIDEO BRONCHOSCOPY WITH ENDOBRONCHIAL ULTRASOUND  03/15/2020   Procedure: VIDEO BRONCHOSCOPY WITH ENDOBRONCHIAL ULTRASOUND;  Surgeon: Prudy Brownie,  DO;  Location: [facility] ENDOSCOPY;  Service: Pulmonary;;   WISDOM TOOTH EXTRACTION     Patient Active Problem List   Diagnosis Date Noted   Severe anemia 02/28/2023   Bilateral lower extremity edema 02/27/2023   Acute sinusitis 01/09/2023   Statin myopathy 10/15/2022   Multinodular goiter 11/29/2020   Gastroesophageal reflux disease with esophagitis without hemorrhage 06/01/2020   History of peptic ulcer disease 06/01/2020   Duodenal stricture 06/01/2020   Dysphagia 06/01/2020   Abnormal findings on esophagogastroduodenoscopy (EGD) 06/01/2020   Coronary artery calcification seen on CAT scan 03/23/2020   OSA on CPAP 01/04/2020   HSV (herpes simplex virus) infection 11/09/2019   Class 2 severe obesity due to excess calories with serious comorbidity and body mass index (BMI) of 37.0 to 37.9 in adult (HCC) 06/27/2019   Ganglion cyst of tendon sheath of right hand 04/28/2017   COPD, severe (HCC) 05/21/2016   Neuropathy of hand 02/15/2016   Type 2 diabetes mellitus without complication, without long-term current use of insulin  (HCC) 01/13/2015  Insomnia 01/13/2015   Ovarian cyst, right 08/21/2014   Endometrial polyp 08/21/2014   Intramural leiomyoma of uterus 08/21/2014   PFO (patent foramen ovale) 08/17/2014   History of cervical dysplasia 07/31/2014   Fibroid, uterine 07/31/2014   History of cervical polypectomy 07/31/2014   Chronic low back pain 11/13/2011   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 08/11/2011   History of pineal cyst    Hypothyroidism    Former heavy tobacco smoker 06/12/2011   History of CVA (cerebrovascular accident) 04/25/2011   Migraine without aura and without status migrainosus, not intractable 01/29/2010   Hyperlipidemia LDL goal <70 02/12/2009   Essential hypertension 01/10/2009   Barrett's esophagus with dysplasia 05/28/2005    PCP: Carolyn Cisco, NP   REFERRING PROVIDER: Zuleta, Kaitlin G, NP   REFERRING DIAG: N32.81 (ICD-10-CM) -  OAB (overactive bladder) N39.41 (ICD-10-CM) - Urge incontinence of urine K59.01 (ICD-10-CM) - Slow transit constipation R35.0 (ICD-10-CM) - Urinary frequency  THERAPY DIAG:  Muscle weakness (generalized)  Other muscle spasm  Abnormality of gait and mobility  Unspecified lack of coordination  Abnormal posture  Rationale for Evaluation and Treatment: Rehabilitation  ONSET DATE: several years  SUBJECTIVE:                                                                                                                                                                                           SUBJECTIVE STATEMENT: Pt reports that she has a lot of leakage, but she does not feel like she is going to pee on herself in the morning. She is on Gemteza and doing treatments at urogyne where they put a needle and tens unit in her  She has a TENS unit for her back and it helps a lot.  Boyfriend has a very healthy apetite and she hurts when she uses a vibrator When something goes in, it hits a wall, wondering if something can be fixed or if she is broken forever She spent her whole life being crazy about this man.  Nobody knows about them, she is a dirty little secret.    I urinate a lot, 2 hours, 1-2x nightly. Does have dribbling or urine sometimes.    Fluid intake: water - 8 oz bottles (8x day), a soda for dinner   PAIN:  Are you having pain? Yes NPRS scale: 9/10 Pain location:  low back; head  Pain type: aching Pain description: constant, sharp, and burning   Aggravating factors: waking first thing usually has a headache. Walking, housework, lifting, standing around 5 mins then back back starts Relieving factors: nothing  PRECAUTIONS: None  RED FLAGS: None  WEIGHT BEARING RESTRICTIONS: No  FALLS:  Has patient fallen in last 6 months? No  OCCUPATION: not currently   ACTIVITY LEVEL : low  PLOF: Independent  PATIENT GOALS: to have less pain and less urine frequency and  leakage  PERTINENT HISTORY:  Anxiety, Abnormal vaginal bleeding, COPD, Diabetes mellitus without complication type 2, Endometrial polyp, Fibroid uterus, CVA, HSV,  Internal hemorrhoids, Tachycardia, HYSTEROSCOPY, Ovarian cyst, right,  Sexual abuse: No  BOWEL MOVEMENT: Pain with bowel movement: No Type of bowel movement:Type (Bristol Stool Scale) 1-2, Frequency every other day, and Strain yes Fully empty rectum: No Leakage: No Pads: No Fiber supplement/laxative Yes stool softener 6-9 daily  URINATION: Pain with urination: No Fully empty bladder: Yes:   Stream: Strong and Weak Urgency: Yes  Frequency: 7-8x daily; 1-2x nightly Leakage:  unsure what causes it  Pads: Yes: 3 pads  INTERCOURSE:  Ability to have vaginal penetration Yes  Pain with intercourse: Initial Penetration, During Penetration, and Deep Penetration Dryness yes  Climax: difficult to achieve Marinoff Scale: 1/3 Lubricant - no uses estrogen    PREGNANCY: Vaginal deliveries 0 C-section deliveries 0 Currently pregnant No  PROLAPSE: Pressure - hard bowel movements    OBJECTIVE:  Note: Objective measures were completed at Evaluation unless otherwise noted.  DIAGNOSTIC FINDINGS:    PATIENT SURVEYS:    PFIQ-7: summary score 250 (UIQ-7 86,CRAIQ-7 81, POPIQ -7 86)  COGNITION: Overall cognitive status: Within functional limits for tasks assessed     SENSATION: Light touch: Appears intact  LUMBAR SPECIAL TESTS:  Single leg stance test: 1s on Rt, 3s Lt LOB noted immediately, SI Compression/distraction test: Negative, and FABER test: Negative  FUNCTIONAL TESTS:  Functional squat - forward trunk flexion, limited descent by 50%, bil knee valgus, and need of hand at thighs to return upright  Sit up test- 0/3  GAIT: Assistive device utilized: None Comments: decreased cadence, trunk flexion, poor arm swings, decreased step height bil and decreased stride length  POSTURE: rounded shoulders, forward head,  and posterior pelvic tilt   LUMBARAROM/PROM:  A/PROM A/PROM  eval  Flexion Limited by 25%  Extension WFL  Right lateral flexion Limited by 50%  Left lateral flexion Limited by 50%  Right rotation Limited by 50%  Left rotation Limited by 50%   (Blank rows = not tested)  LOWER EXTREMITY ROM:  Bil hamstrings, adductors, hip IR, hip ER limited by 25%   LOWER EXTREMITY MMT:  Bil hips grossly 3/5, knees 4/5 PALPATION:   General: TTP at bil piriformis, tight bil thoracic and lumbar paraspinals  Pelvic Alignment: WFL  Abdominal: TTP and tension throughout upper and lower abdominal quadrants                External Perineal Exam: dryness and redness noted in vulva, poor clitoral hood mobility with adhesion noted                             Internal Pelvic Floor: TTP throughout bil superficial and deep pelvic floor layers, poor tissue mobility throughout   Patient confirms identification and approves PT to assess internal pelvic floor and treatment Yes  PELVIC MMT:   MMT eval  Vaginal 2/5; 3s; 5 reps  Internal Anal Sphincter   External Anal Sphincter   Puborectalis   Diastasis Recti Bulging noted with sit up test but unable to palpate borders fully   (Blank rows = not tested)        TONE:  Increased   PROLAPSE: Not seen in hooklying   TODAY'S TREATMENT:                                                                                                                              DATE:   Neuro reed- transverse abdominis breath with ball press supine, butterfly stretch, lateral trunk rotation Diaphragmatic breathing  There acts- education or pain free use of vibrator, TTNS, HEP, review of eval, goals    08/05/2023 EVAL Examination completed, findings reviewed, pt educated on POC, HEP, and feminine moisturizers. Pt motivated to participate in PT and agreeable to attempt recommendations.     PATIENT EDUCATION:  Education details: [email or web address]; Person educated:  Patient Education method: Explanation, Demonstration, Tactile cues, Verbal cues, and Handouts Education comprehension: verbalized understanding, returned demonstration, verbal cues required, tactile cues required, and needs further education  HOME EXERCISE PROGRAM: Access Code: [email or web address] URL: [email or web address] Date: 08/05/2023 Prepared by: [facility] - Outpatient Rehab - [facility]  Exercises - Supine Diaphragmatic Breathing  - 1 x daily - 7 x weekly - 1 sets - 10 reps - Supine Lower Trunk Rotation  - 1 x daily - 7 x weekly - 1 sets - 3 reps - 30s holds - Supine Butterfly Groin Stretch  - 1 x daily - 7 x weekly - 3 sets - 30s hold - Standing L  - 1 x daily - 7 x weekly - 1 sets - 3 reps - 30s holds  ASSESSMENT:  CLINICAL IMPRESSION: Pt with poor abdominal tone, breath holding strategies, abdominal hernia- was not sure how long she has had it, discussed red flags with more core work in PT. Discussed  using her vibrator with lubricant in pain free range for a few minutes every other day to help with pain with intercourse, added new exercises for PF contraction and coordination with diaphragm and breathing. Pt to read about urge drill. Pt will continue to benefit from PT.   OBJECTIVE IMPAIRMENTS: decreased activity tolerance, decreased balance, decreased coordination, decreased endurance, decreased knowledge of condition, decreased mobility, difficulty walking, decreased ROM, decreased strength, dizziness, hypomobility, increased fascial restrictions, impaired perceived functional ability, increased muscle spasms, impaired flexibility, improper body mechanics, postural dysfunction, and pain.   ACTIVITY LIMITATIONS: carrying, lifting, squatting, stairs, continence, and locomotion level  PARTICIPATION LIMITATIONS: meal prep, cleaning, laundry, interpersonal relationship, community activity, and yard work  PERSONAL FACTORS: Age, Fitness, Past/current experiences,  Time since onset of injury/illness/exacerbation, and 1 comorbidity: medical history  are also affecting patient's functional outcome.   REHAB POTENTIAL: Good  CLINICAL DECISION MAKING: Stable/uncomplicated  EVALUATION COMPLEXITY: Low   GOALS: Goals reviewed with patient? Yes  SHORT TERM GOALS: Target date: 08/03/23  Pt to be I with HEP for carry over and continuing recommendations for improved outcomes.   Baseline: Goal status: INITIAL  2.  Pt will be independent with the knack, urge suppression technique, and double voiding in order to improve bladder habits and  decrease urinary incontinence.   Baseline:  Goal status: INITIAL  3.  Pt will be independent with use of squatty potty, relaxed toileting mechanics, and improved bowel movement techniques in order to increase ease of bowel movements and complete evacuation for health bowel habits.  Baseline:  Goal status: INITIAL  4.  Pt to demonstrate at least 4/5 bil hip strength for improved pelvic stability and functional squats without pain for I with transferring on/off the toilet.  Baseline:  Goal status: INITIAL  5.  Pt to be I with mobility of pelvic floor with contract/relax/contract/bulge for decreased tension at pelvic floor and improved tolerance for vaginal penetration.  Baseline:  Goal status: INITIAL   LONG TERM GOALS: Target date: 11/04/23  Pt to be I with advanced HEP for carry over and continuing recommendations for improved outcomes.   Baseline:  Goal status: INITIAL  2.  Pt will have 50% less urgency due to bladder retraining and strengthening for confidence with leaving the house for community outings.  Baseline:  Goal status: INITIAL  3. Pt to demonstrate improved coordination of pelvic floor and breathing mechanics with 10# squat with appropriate synergistic patterns to decrease pain and leakage at least 75% of the time for improved ability to complete a 30 minute walk without strain at pelvic floor and  symptoms.    Baseline:  Goal status: INITIAL  4.  Pt will report 3 BMs per week due to improved muscle tone and coordination with bowel movements for healthy bowel habits.  Baseline:  Goal status: INITIAL  5.  Pt to report no more than 2/10 pain with vaginal dilator size 6 or equivalent for improved tolerance to intercourse.  Baseline:  Goal status: INITIAL  6.  Pt to report improved time between bladder voids to at least 3 hours for improved QOL with decreased urinary frequency.   Baseline:  Goal status: INITIAL  PLAN:  PT FREQUENCY: 2x/week  PT DURATION:  8 sessions  PLANNED INTERVENTIONS: 97110-Therapeutic exercises, 97530- Therapeutic activity, 97112- Neuromuscular re-education, 97535- Self Care, 11914- Manual therapy, [phone]- Aquatic Therapy, [phone]- Electrical stimulation (manual), [id number]- Vasopneumatic device, Patient/Family education, Taping, Dry Needling, Joint mobilization, Spinal mobilization, Scar mobilization, Vestibular training, DME instructions, Cryotherapy, Moist heat, and Biofeedback  PLAN FOR NEXT SESSION: internal as needed; hips and back and abdomen stretching, hip and core strengthening, urge drill, voiding mechanics, abdominal massage   Navdeep Fessenden, PT 08/14/23 12:09 PM  08/14/2510:08 PM

## 2023-08-18 ENCOUNTER — Encounter: Payer: Self-pay | Admitting: Obstetrics and Gynecology

## 2023-08-18 ENCOUNTER — Ambulatory Visit (INDEPENDENT_AMBULATORY_CARE_PROVIDER_SITE_OTHER): Admitting: Obstetrics and Gynecology

## 2023-08-18 VITALS — BP 128/82 | HR 67

## 2023-08-18 DIAGNOSIS — N3281 Overactive bladder: Secondary | ICD-10-CM | POA: Diagnosis not present

## 2023-08-18 DIAGNOSIS — N3941 Urge incontinence: Secondary | ICD-10-CM

## 2023-08-18 LAB — CYTOLOGY - PAP: Adequacy: ABSENT

## 2023-08-18 NOTE — Progress Notes (Signed)
 [facility]  PTNS VISIT  CC:  Overactive bladder  68 y.o. with refractory overactive bladder who presents for percutaneous tibial nerve stimulation. The patient presents for PTNS session # 4.   Procedure: The patient was placed in the sitting position and the right lower extremity was prepped in the usual fashion. The PTNS needle was then inserted at a 60 degree angle, 5 cm cephalad and 2 cm posterior to the medial malleolus. The PTNS unit was then programmed and an optimal response was noted at 7 milliamps. The PTNS stimulation was then performed at this setting for 30 minutes without incident and the patient tolerated the procedure well. The needle was removed and hemostasis was noted.   The pt will return in 1 week for PTNS session # 5. All questions were answered.

## 2023-08-19 ENCOUNTER — Ambulatory Visit (HOSPITAL_COMMUNITY)
Admission: RE | Admit: 2023-08-19 | Discharge: 2023-08-19 | Disposition: A | Discharge status: Home / Self Care | Source: Ambulatory Visit | Attending: Obstetrics and Gynecology | Admitting: Obstetrics and Gynecology

## 2023-08-19 DIAGNOSIS — N950 Postmenopausal bleeding: Secondary | ICD-10-CM | POA: Insufficient documentation

## 2023-08-20 ENCOUNTER — Emergency Department (HOSPITAL_COMMUNITY)

## 2023-08-20 ENCOUNTER — Telehealth: Payer: Self-pay | Admitting: Nurse Practitioner

## 2023-08-20 ENCOUNTER — Other Ambulatory Visit: Payer: Self-pay

## 2023-08-20 ENCOUNTER — Emergency Department (HOSPITAL_COMMUNITY)
Admission: EM | Admit: 2023-08-20 | Discharge: 2023-08-21 | Disposition: A | Discharge status: Home / Self Care | Attending: Emergency Medicine | Admitting: Emergency Medicine

## 2023-08-20 DIAGNOSIS — I10 Essential (primary) hypertension: Secondary | ICD-10-CM

## 2023-08-20 DIAGNOSIS — E119 Type 2 diabetes mellitus without complications: Secondary | ICD-10-CM | POA: Insufficient documentation

## 2023-08-20 DIAGNOSIS — Z87891 Personal history of nicotine dependence: Secondary | ICD-10-CM | POA: Insufficient documentation

## 2023-08-20 DIAGNOSIS — Z7982 Long term (current) use of aspirin: Secondary | ICD-10-CM | POA: Insufficient documentation

## 2023-08-20 DIAGNOSIS — R519 Headache, unspecified: Secondary | ICD-10-CM | POA: Insufficient documentation

## 2023-08-20 DIAGNOSIS — Z7984 Long term (current) use of oral hypoglycemic drugs: Secondary | ICD-10-CM | POA: Insufficient documentation

## 2023-08-20 LAB — CBC WITH DIFFERENTIAL/PLATELET
Abs Immature Granulocytes: 0.02 10*3/uL (ref 0.00–0.07)
Basophils Absolute: 0 10*3/uL (ref 0.0–0.1)
Basophils Relative: 1 %
Eosinophils Absolute: 0.2 10*3/uL (ref 0.0–0.5)
Eosinophils Relative: 4 %
HCT: 46.4 % — ABNORMAL HIGH (ref 36.0–46.0)
Hemoglobin: 14.5 g/dL (ref 12.0–15.0)
Immature Granulocytes: 0 %
Lymphocytes Relative: 36 %
Lymphs Abs: 2 10*3/uL (ref 0.7–4.0)
MCH: 30.5 pg (ref 26.0–34.0)
MCHC: 31.3 g/dL (ref 30.0–36.0)
MCV: 97.7 fL (ref 80.0–100.0)
Monocytes Absolute: 0.5 10*3/uL (ref 0.1–1.0)
Monocytes Relative: 8 %
Neutro Abs: 2.8 10*3/uL (ref 1.7–7.7)
Neutrophils Relative %: 51 %
Platelets: 206 10*3/uL (ref 150–400)
RBC: 4.75 MIL/uL (ref 3.87–5.11)
RDW: 14.6 % (ref 11.5–15.5)
WBC: 5.5 10*3/uL (ref 4.0–10.5)
nRBC: 0 % (ref 0.0–0.2)

## 2023-08-20 LAB — URINALYSIS, ROUTINE W REFLEX MICROSCOPIC
Bilirubin Urine: NEGATIVE
Glucose, UA: NEGATIVE mg/dL
Hgb urine dipstick: NEGATIVE
Ketones, ur: NEGATIVE mg/dL
Leukocytes,Ua: NEGATIVE
Nitrite: NEGATIVE
Protein, ur: 30 mg/dL — AB
Specific Gravity, Urine: 1.017 (ref 1.005–1.030)
pH: 5 (ref 5.0–8.0)

## 2023-08-20 LAB — BASIC METABOLIC PANEL WITH GFR
Anion gap: 9 (ref 5–15)
BUN: 16 mg/dL (ref 8–23)
CO2: 26 mmol/L (ref 22–32)
Calcium: 9.4 mg/dL (ref 8.9–10.3)
Chloride: 101 mmol/L (ref 98–111)
Creatinine, Ser: 0.84 mg/dL (ref 0.44–1.00)
GFR, Estimated: >60 mL/min (ref >60)
Glucose, Bld: 104 mg/dL — ABNORMAL HIGH (ref 70–99)
Potassium: 3.9 mmol/L (ref 3.5–5.1)
Sodium: 136 mmol/L (ref 135–145)

## 2023-08-20 LAB — CBG MONITORING, ED: Glucose-Capillary: 102 mg/dL — ABNORMAL HIGH (ref 70–99)

## 2023-08-20 NOTE — Telephone Encounter (Signed)
 Pt c/o BP issue: STAT if pt c/o blurred vision, one-sided weakness or slurred speech.  STAT if BP is GREATER than 180/120 TODAY.  STAT if BP is LESS than 90/60 and SYMPTOMATIC TODAY  1. What is your BP concern? Pt concerned her bp has been high, please advise.   2. Have you taken any BP medication today? yes   3. What are your last 5 BP readings?  172/96 - this morning  146/87 - 2pm  172/92 - 4pm   4. Are you having any other symptoms (ex. Dizziness, headache, blurred vision, passed out)? Headache

## 2023-08-20 NOTE — ED Triage Notes (Signed)
 Pt reports since waking up this morning she has had a headache, home nurse checked bp and it was 170's and it has continued to be high.  She is compliant with her meds. Last tramadol  around 1600.  Lightheaded when she stands up.

## 2023-08-20 NOTE — ED Provider Triage Note (Signed)
 Emergency Medicine Provider Triage Evaluation Note  Grace [name] , a 68 y.o. female  was evaluated in triage.  Pt complains of right sided HA, eye pain, sensitivity to light. BP 190s all days. Better now, did take medications.   Review of Systems  Positive: HA Negative: CP, SOB  Physical Exam  BP (!) 144/93 (BP Location: Left Arm)   Pulse 75   Temp 97.9 F (36.6 C) (Oral)   Resp 17   LMP 02/27/2014   SpO2 90%  Gen:   Awake, no distress   Resp:  Normal effort  MSK:   Moves extremities without difficulty  Other:  No focal deficits   Medical Decision Making  Medically screening exam initiated at 8:10 PM.  Appropriate orders placed.  Grace [name] was informed that the remainder of the evaluation will be completed by another provider, this initial triage assessment does not replace that evaluation, and the importance of remaining in the ED until their evaluation is complete.     Grace [name], Grace [name] 08/20/23 2012

## 2023-08-21 ENCOUNTER — Ambulatory Visit: Admitting: Obstetrics and Gynecology

## 2023-08-21 ENCOUNTER — Ambulatory Visit

## 2023-08-21 MED ORDER — DEXAMETHASONE SODIUM PHOSPHATE 10 MG/ML IJ SOLN
10.0000 mg | Freq: Once | INTRAMUSCULAR | Status: AC
  Administered 2023-08-21: 10 mg via INTRAMUSCULAR
  Filled 2023-08-21: qty 1

## 2023-08-21 MED ORDER — KETOROLAC TROMETHAMINE 15 MG/ML IJ SOLN
15.0000 mg | Freq: Once | INTRAMUSCULAR | Status: AC
  Administered 2023-08-21: 15 mg via INTRAMUSCULAR
  Filled 2023-08-21: qty 1

## 2023-08-21 MED ORDER — PROCHLORPERAZINE MALEATE 10 MG PO TABS
10.0000 mg | ORAL_TABLET | Freq: Once | ORAL | Status: AC
  Administered 2023-08-21: 10 mg via ORAL
  Filled 2023-08-21: qty 1

## 2023-08-21 NOTE — Telephone Encounter (Signed)
 Spoke with patient and she states she went to the hospital when she didn't hear anything back from us . Her BP elevated to 204/106 and she had an headache. Today her BP is 145/80 H R63. She is asymptomatic today.  Advised to monitor BP daily and keep a log. Will forward to provider

## 2023-08-21 NOTE — ED Provider Notes (Signed)
 [facility] EMERGENCY DEPARTMENT AT [facility] Provider Note   CSN: [id number] Arrival date & time: 08/20/23  1844     History  Chief Complaint  Patient presents with   Headache    Grace [name] is a 68 y.o. female.  Patient presents the emergency room complaining of a headache.  She states that she was at home and noticed that her blood pressure was higher than usual.  Her home nurse checked her blood pressure and noticed it was 170 systolic.  The patient checked throughout the day and found a blood pressure reading as high as 200 systolic.  Upon arrival her blood pressure was 144/93.  She reports compliance with her blood pressure medication.  The patient states that she has had a headache for the past day which began as a frontal headache and migrated to the right side with some pain behind the right eye.  She reports a history of frequent headaches beginning when she was 68 years old.  She has been evaluated previously by neurology.  She states she normally takes [place] powders at home for her headaches but has been advised to stop taking Goody powders as frequently as she used to take them.  She denies any neurologic deficits at this time.  Past medical history significant for type II DM, hypertension, Barrett's esophagus, migraines, stroke   Headache      Home Medications Prior to Admission medications   Medication Sig Start Date End Date Taking? Authorizing Provider  albuterol  (PROAIR  HFA) 108 (90 Base) MCG/ACT inhaler INHALE 2 PUFFS EVERY 6  HOURS AS NEEDED FOR  SHORTNESS OF BREATH OR  WHEEZING Patient taking differently: Inhale 2 puffs into the lungs every 6 (six) hours as needed for wheezing. 05/17/19   Verma Gobble, NP  aspirin  EC 81 MG tablet Take 1 tablet (81 mg total) by mouth daily. Swallow whole. 03/03/23 03/02/24  Regalado, Belkys A, MD  bisoprolol  (ZEBETA ) 10 MG tablet Take 10 mg by mouth daily. 04/19/20   [provider]   Budeson-Glycopyrrol-Formoterol  (BREZTRI  AEROSPHERE) 160-9-4.8 MCG/ACT AERO Inhale 2 puffs into the lungs in the morning and at bedtime. 03/18/23   Cobb, Mariah Shines, NP  calcium  carbonate (TUMS - DOSED IN MG ELEMENTAL CALCIUM ) 500 MG chewable tablet Chew 1 tablet by mouth daily as needed for indigestion or heartburn.    [provider]  COLLAGEN-VITAMIN C PO Take 3 tablets by mouth in the morning and at bedtime.    [provider]  estradiol  (ESTRACE  VAGINAL) 0.1 MG/GM vaginal cream Place 1 Applicatorful vaginally at bedtime. Patient taking differently: Place 1 Applicatorful vaginally 2 (two) times a week. 08/19/19   Verma Gobble, NP  ezetimibe  (ZETIA ) 10 MG tablet Take 10 mg by mouth daily. 09/29/22   [provider]  fluticasone  (FLONASE ) 50 MCG/ACT nasal spray Place 2 sprays into both nostrils daily. 01/09/23   Cobb, Mariah Shines, NP  furosemide  (LASIX ) 40 MG tablet Take 1 tab daily as needed for weight gain of 3 lb over night or 5 lb in 1 week. Patient taking differently: Take 40 mg by mouth See admin instructions. Take 1 tab daily as needed for weight gain of 3 lb over night or 5 lb in 1 week. 12/31/22   Jude Norton, NP  glipiZIDE  (GLUCOTROL  XL) 5 MG 24 hr tablet TAKE 1 TABLET BY MOUTH  DAILY WITH BREAKFAST Patient taking differently: Take 5 mg by mouth every evening. 09/05/19   Eubanks, Jessica K,  NP  glucose blood (ONETOUCH VERIO) test strip Use to check blood sugar twice daily. Dx: E11.9 08/25/18   Verma Gobble, NP  guaiFENesin  (MUCINEX ) 600 MG 12 hr tablet Take 600 mg by mouth 2 (two) times daily as needed for to loosen phlegm or cough.    [provider]  hydrALAZINE  (APRESOLINE ) 50 MG tablet TAKE 1 TABLET BY MOUTH 3  TIMES DAILY Patient taking differently: Take 50 mg by mouth 3 (three) times daily. 07/25/19   Eubanks, Jessica K, NP  ipratropium-albuterol  (DUONEB) 0.5-2.5 (3) MG/3ML SOLN Take 3 mLs by nebulization every 6 (six) hours as needed.  02/07/19   Quillian Brunt, MD  isosorbide  mononitrate (IMDUR ) 30 MG 24 hr tablet Take 2 tablets (60 mg total) by mouth daily. 01/15/23   Jude Norton, NP  Lancets (ONETOUCH DELICA PLUS LANCET33G) MISC USE TO CHECK BLOOD SUGAR  TWICE DAILY 04/01/19   Eubanks, Jessica K, NP  levothyroxine  (SYNTHROID ) 200 MCG tablet Take 200 mcg by mouth at bedtime.    [provider]  losartan  (COZAAR ) 100 MG tablet TAKE 1 TABLET BY MOUTH  DAILY Patient taking differently: Take 100 mg by mouth every evening. 07/20/19   Eubanks, Jessica K, NP  pantoprazole  (PROTONIX ) 40 MG tablet Take 1 tablet (40 mg total) by mouth 2 (two) times daily. 03/03/23 05/02/23  Regalado, Belkys A, MD  pregabalin  (LYRICA ) 50 MG capsule Take 50 mg by mouth daily. 10/13/22   [provider]  REPATHA  SURECLICK 140 MG/ML SOAJ INJECT 1 PEN SUBCUTANEOUSLY  EVERY 2 WEEKS Patient taking differently: Inject 140 mg into the skin 2 (two) times a week. 02/20/23   Arleen Lacer, MD  Respiratory Therapy Supplies (NEBULIZER/TUBING/MOUTHPIECE) KIT 1 application by Does not apply route every 6 (six) hours as needed. FAX TO [phone] 07/19/19   Eubanks, Jessica K, NP  traMADol  (ULTRAM ) 50 MG tablet Take 50 mg by mouth 3 (three) times daily as needed. 03/16/23   [provider]  valACYclovir  (VALTREX ) 500 MG tablet TAKE 1 TABLET BY MOUTH  DAILY Patient taking differently: Take 500 mg by mouth daily. 11/23/19   Verma Gobble, NP  Vibegron  75 MG TABS Take 1 tablet (75 mg total) by mouth daily. 07/24/23   Zuleta, Kaitlin G, NP  ipratropium (ATROVENT  HFA) 17 MCG/ACT inhaler Inhale 2 puffs into the lungs 4 (four) times daily. 04/25/11 06/30/11  Duane Gerold, MD      Allergies    Amlodipine , Codeine, Gabapentin , and Prozac [fluoxetine hcl]    Review of Systems   Review of Systems  Neurological:  Positive for headaches.    Physical Exam Updated Vital Signs BP (!) 164/114 (BP Location: Left Arm)   Pulse (!) 56   Temp  97.7 F (36.5 C)   Resp 18   LMP 02/27/2014   SpO2 96%  Physical Exam HENT:     Head: Normocephalic and atraumatic.  Eyes:     Pupils: Pupils are equal, round, and reactive to light.  Pulmonary:     Effort: Pulmonary effort is normal. No respiratory distress.  Musculoskeletal:        General: No signs of injury.     Cervical back: Normal range of motion. No rigidity.  Skin:    General: Skin is dry.  Neurological:     Mental Status: She is alert.     Comments: Cranial nerves II through VII, XI, XII intact  Psychiatric:        Speech: Speech normal.  Behavior: Behavior normal.     ED Results / Procedures / Treatments   Labs (all labs ordered are listed, but only abnormal results are displayed) Labs Reviewed  BASIC METABOLIC PANEL WITH GFR - Abnormal; Notable for the following components:      Result Value   Glucose, Bld 104 (*)    All other components within normal limits  CBC WITH DIFFERENTIAL/PLATELET - Abnormal; Notable for the following components:   HCT 46.4 (*)    All other components within normal limits  URINALYSIS, ROUTINE W REFLEX MICROSCOPIC - Abnormal; Notable for the following components:   Protein, ur 30 (*)    Bacteria, UA RARE (*)    All other components within normal limits  CBG MONITORING, ED - Abnormal; Notable for the following components:   Glucose-Capillary 102 (*)    All other components within normal limits    EKG None  Radiology CT HEAD WO CONTRAST Result Date: 08/20/2023 CLINICAL DATA:  Headache, increasing frequency or severity EXAM: CT HEAD WITHOUT CONTRAST TECHNIQUE: Contiguous axial images were obtained from the base of the skull through the vertex without intravenous contrast. RADIATION DOSE REDUCTION: This exam was performed according to the departmental dose-optimization program which includes automated exposure control, adjustment of the mA and/or kV according to patient size and/or use of iterative reconstruction technique.  COMPARISON:  None Available. FINDINGS: Brain: No evidence of acute infarction, hemorrhage, hydrocephalus, extra-axial collection or mass lesion/mass effect. Partially empty sella. Vascular: No hyperdense vessel. Skull: No acute fracture. Sinuses/Orbits:  Clear sinuses.  No acute orbital findings. IMPRESSION: No evidence of acute intracranial abnormality. Electronically Signed   By: Stevenson Elbe M.D.   On: 08/20/2023 21:18    Procedures Procedures    Medications Ordered in ED Medications  ketorolac (TORADOL) 15 MG/ML injection 15 mg (15 mg Intramuscular Given 08/21/23 0113)  dexamethasone  (DECADRON ) injection 10 mg (10 mg Intramuscular Given 08/21/23 0113)  prochlorperazine (COMPAZINE) tablet 10 mg (10 mg Oral Given 08/21/23 0114)    ED Course/ Medical Decision Making/ A&P                                 Medical Decision Making Risk Prescription drug management.   This patient presents to the ED for concern of headache, this involves an extensive number of treatment options, and is a complaint that carries with it a high risk of complications and morbidity.  The differential diagnosis includes tension headache, migraine, cluster headache, intracranial abnormality, others   Co morbidities that complicate the patient evaluation  Migraines   Lab Tests:  I Ordered, and personally interpreted labs.  The pertinent results include: Grossly unremarkable BMP, CBC, CBG, UA   Imaging Studies ordered:  I ordered imaging studies including CT head without contrast I independently visualized and interpreted imaging which showed No evidence of acute abnormality I agree with the radiologist interpretation    Problem List / ED Course / Critical interventions / Medication management   I ordered medication including Toradol, Decadron , Compazine for headache Reevaluation of the patient after these medicines showed that the patient improved I have reviewed the patients home medicines and  have made adjustments as needed  Social Determinants of Health:  Patient is a former smoker   Test / Admission - Considered:  Patient feeling significantly better after medications.  No acute abnormality on imaging.  Feel this is likely a migraine based on reported symptoms.  Patient tolerating oral  intake.  No indication for further emergent workup at this time.  Patient stable for discharge home.         Final Clinical Impression(s) / ED Diagnoses Final diagnoses:  Bad headache    Rx / DC Orders ED Discharge Orders     None         Delories Fetter 08/21/23 0237    Alissa April, MD 08/21/23 [phone]

## 2023-08-21 NOTE — ED Notes (Signed)
 Patient verbalizes understanding of discharge instructions. Opportunity for questioning and answers were provided. Armband removed by staff, pt discharged from ED. Wheeled out to lobby

## 2023-08-21 NOTE — Discharge Instructions (Addendum)
 Your CT scan was reassuring.  You may resume home medications.  I do recommend avoiding excess Goody powders.  Please follow with your primary care team for further recommendations for headache management.  If you develop any life-threatening symptoms please return to the emergency department.

## 2023-08-22 NOTE — Telephone Encounter (Signed)
 For episodes of hypotension with future, can take additional dose of hydralazine  50 mg-if blood pressure sustained above 160 mmHg   Randene Bustard, MD

## 2023-08-25 ENCOUNTER — Ambulatory Visit: Payer: Self-pay | Admitting: Physical Therapy

## 2023-08-25 DIAGNOSIS — M6281 Muscle weakness (generalized): Secondary | ICD-10-CM

## 2023-08-25 DIAGNOSIS — M62838 Other muscle spasm: Secondary | ICD-10-CM

## 2023-08-25 DIAGNOSIS — R279 Unspecified lack of coordination: Secondary | ICD-10-CM

## 2023-08-25 NOTE — Therapy (Signed)
 OUTPATIENT PHYSICAL THERAPY FEMALE PELVIC TREATMENT   Patient Name: Grace [name] MRN: [id number] DOB:[birth year], 68 y.o., female Today's Date: 08/25/2023  END OF SESSION:  PT End of Session - 08/25/23 1058     Visit Number 3    Authorization Type MCR    Progress Note Due on Visit 10    PT Start Time 1016    PT Stop Time 1057    PT Time Calculation (min) 41 min    Activity Tolerance Patient tolerated treatment well    Behavior During Therapy WFL for tasks assessed/performed               Past Medical History:  Diagnosis Date   Abnormal vaginal bleeding    Anxiety    Asthma    Barrett's esophagus    with high grade dysplasia per endoscopy 05/2005 // followed by Dr. Loy Ruff ([facility])   Chronic back pain    COPD (chronic obstructive pulmonary disease) (HCC)    Diabetes mellitus without complication (HCC)    type 2   Duodenal ulcer disease    thought to be contributed at least partly by overuse of NSAIDs and salicylates (goody powdr)   Endometrial polyp 03/2003   s/p resection in 03/2003. Path showing submucosal leiomyoma and benign  proliferative type endometrium   Family history of premature CAD 05/19/2018   Fibroid uterus    s/p myomectomy x 2   GERD (gastroesophageal reflux disease)    H/O failed moderate sedation    Hiatal hernia    History of CVA (cerebrovascular accident) 10/2000   History of pineal cyst    11 mm cystic mass noted in the pineal gland per MRI in 2001 - most consitent with simple pineal cyst   HSV infection    Hyperlipidemia    Hypertension    Hypothyroidism    with hx of multinodular goiter (noted on neck US  in 11/2002 - showing diffuse nodularity and inhomogenous texture diffusely BL)   Internal hemorrhoids    grade 2 per colonoscopy in 09/2006 - repeat colonoscopy rec in 5-10 years.   Migraine    Nevus    Personal history of failed conscious sedation 08/11/2011   Sleep apnea    does not use cpap   Stroke (HCC) 10/2000    Tachycardia    Thyroid  disease    Tobacco abuse Jan 2014   quit   Past Surgical History:  Procedure Laterality Date   ADENOIDECTOMY     APPENDECTOMY  2017   BALLOON DILATION N/A 06/14/2020   Procedure: BALLOON DILATION;  Surgeon: Brice Campi Albino Alu., MD;  Location: [facility] ENDOSCOPY;  Service: Gastroenterology;  Laterality: N/A;   BIOPSY  02/15/2020   Procedure: BIOPSY;  Surgeon: Evangeline Hilts, MD;  Location: [facility] ENDOSCOPY;  Service: Endoscopy;;   BIOPSY  06/14/2020   Procedure: BIOPSY;  Surgeon: Normie Becton., MD;  Location: [facility] ENDOSCOPY;  Service: Gastroenterology;;   BIOPSY  03/02/2023   Procedure: BIOPSY;  Surgeon: Genell Ken, MD;  Location: [facility] ENDOSCOPY;  Service: Gastroenterology;;   BRONCHIAL BIOPSY  03/15/2020   Procedure: BRONCHIAL BIOPSIES;  Surgeon: Prudy Brownie, DO;  Location: [facility] ENDOSCOPY;  Service: Pulmonary;;   BRONCHIAL BRUSHINGS  03/15/2020   Procedure: BRONCHIAL BRUSHINGS;  Surgeon: Prudy Brownie, DO;  Location: [facility] ENDOSCOPY;  Service: Pulmonary;;   BRONCHIAL NEEDLE ASPIRATION BIOPSY  03/15/2020   Procedure: BRONCHIAL NEEDLE ASPIRATION BIOPSIES;  Surgeon: Prudy Brownie, DO;  Location: [facility] ENDOSCOPY;  Service: Pulmonary;;   BRONCHIAL  WASHINGS  03/15/2020   Procedure: BRONCHIAL WASHINGS;  Surgeon: Prudy Brownie, DO;  Location: [facility] ENDOSCOPY;  Service: Pulmonary;;   CHOLECYSTECTOMY     COLONOSCOPY  02/07/2010   COLONOSCOPY WITH PROPOFOL  N/A 02/15/2020   Procedure: COLONOSCOPY WITH PROPOFOL ;  Surgeon: Evangeline Hilts, MD;  Location: [facility] ENDOSCOPY;  Service: Endoscopy;  Laterality: N/A;   DILATION AND CURETTAGE OF UTERUS     ENDOSCOPIC MUCOSAL RESECTION N/A 06/14/2020   Procedure: ENDOSCOPIC MUCOSAL RESECTION;  Surgeon: Brice Campi Albino Alu., MD;  Location: [facility] ENDOSCOPY;  Service: Gastroenterology;  Laterality: N/A;   ESOPHAGOGASTRODUODENOSCOPY (EGD) WITH PROPOFOL  N/A 02/15/2020   Procedure: ESOPHAGOGASTRODUODENOSCOPY (EGD) WITH PROPOFOL ;  Surgeon: Evangeline Hilts, MD;  Location: [facility] ENDOSCOPY;  Service: Endoscopy;  Laterality: N/A;   ESOPHAGOGASTRODUODENOSCOPY (EGD) WITH PROPOFOL  N/A 06/14/2020   Procedure: ESOPHAGOGASTRODUODENOSCOPY (EGD) WITH PROPOFOL ;  Surgeon: Brice Campi Albino Alu., MD;  Location: [facility] ENDOSCOPY;  Service: Gastroenterology;  Laterality: N/A;   ESOPHAGOGASTRODUODENOSCOPY (EGD) WITH PROPOFOL  Left 03/02/2023   Procedure: ESOPHAGOGASTRODUODENOSCOPY (EGD) WITH PROPOFOL ;  Surgeon: Genell Ken, MD;  Location: [facility] ENDOSCOPY;  Service: Gastroenterology;  Laterality: Left;   FOREIGN BODY REMOVAL  06/14/2020   Procedure: FOREIGN BODY REMOVAL;  Surgeon: Brice Campi Albino Alu., MD;  Location: [facility] ENDOSCOPY;  Service: Gastroenterology;;   HYSTEROSCOPY     LAPAROSCOPIC APPENDECTOMY N/A 09/23/2015   Procedure: APPENDECTOMY LAPAROSCOPIC;  Surgeon: Dorena Gander, MD;  Location: [facility] OR;  Service: General;  Laterality: N/A;   MYOMECTOMY  1990, 1997   x 2 - In 1997, noted to have extensive pelvic adhesions and BL tubal obstruction   POLYPECTOMY  02/15/2020   Procedure: POLYPECTOMY;  Surgeon: Evangeline Hilts, MD;  Location: [facility] ENDOSCOPY;  Service: Endoscopy;;   TONSILLECTOMY     TRANSTHORACIC ECHOCARDIOGRAM  06/2014    EF 55-60%.  No RWMA. Gr 1 DD. Cannot R/o PFO (consider bubble study).   UPPER ESOPHAGEAL ENDOSCOPIC ULTRASOUND (EUS) N/A 06/14/2020   Procedure: UPPER ESOPHAGEAL ENDOSCOPIC ULTRASOUND (EUS);  Surgeon: Normie Becton., MD;  Location: [facility] ENDOSCOPY;  Service: Gastroenterology;  Laterality: N/A;   UPPER GASTROINTESTINAL ENDOSCOPY  12/18/2009   VIDEO BRONCHOSCOPY WITH ENDOBRONCHIAL NAVIGATION N/A 03/15/2020   Procedure: VIDEO BRONCHOSCOPY WITH ENDOBRONCHIAL NAVIGATION;  Surgeon: Prudy Brownie, DO;  Location: [facility] ENDOSCOPY;  Service: Pulmonary;  Laterality: N/A;   VIDEO BRONCHOSCOPY WITH ENDOBRONCHIAL ULTRASOUND  03/15/2020   Procedure: VIDEO BRONCHOSCOPY WITH ENDOBRONCHIAL ULTRASOUND;  Surgeon: Prudy Brownie, DO;  Location: [facility]  ENDOSCOPY;  Service: Pulmonary;;   WISDOM TOOTH EXTRACTION     Patient Active Problem List   Diagnosis Date Noted   Severe anemia 02/28/2023   Bilateral lower extremity edema 02/27/2023   Acute sinusitis 01/09/2023   Statin myopathy 10/15/2022   Multinodular goiter 11/29/2020   Gastroesophageal reflux disease with esophagitis without hemorrhage 06/01/2020   History of peptic ulcer disease 06/01/2020   Duodenal stricture 06/01/2020   Dysphagia 06/01/2020   Abnormal findings on esophagogastroduodenoscopy (EGD) 06/01/2020   Coronary artery calcification seen on CAT scan 03/23/2020   OSA on CPAP 01/04/2020   HSV (herpes simplex virus) infection 11/09/2019   Class 2 severe obesity due to excess calories with serious comorbidity and body mass index (BMI) of 37.0 to 37.9 in adult (HCC) 06/27/2019   Ganglion cyst of tendon sheath of right hand 04/28/2017   COPD, severe (HCC) 05/21/2016   Neuropathy of hand 02/15/2016   Type 2 diabetes mellitus without complication, without long-term current use of insulin  (HCC) 01/13/2015   Insomnia 01/13/2015   Ovarian cyst, right  08/21/2014   Endometrial polyp 08/21/2014   Intramural leiomyoma of uterus 08/21/2014   PFO (patent foramen ovale) 08/17/2014   History of cervical dysplasia 07/31/2014   Fibroid, uterine 07/31/2014   History of cervical polypectomy 07/31/2014   Chronic low back pain 11/13/2011   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 08/11/2011   History of pineal cyst    Hypothyroidism    Former heavy tobacco smoker 06/12/2011   History of CVA (cerebrovascular accident) 04/25/2011   Migraine without aura and without status migrainosus, not intractable 01/29/2010   Hyperlipidemia LDL goal <70 02/12/2009   Essential hypertension 01/10/2009   Barrett's esophagus with dysplasia 05/28/2005    PCP: Carolyn Cisco, NP   REFERRING PROVIDER: Zuleta, Kaitlin G, NP   REFERRING DIAG: N32.81 (ICD-10-CM) - OAB (overactive  bladder) N39.41 (ICD-10-CM) - Urge incontinence of urine K59.01 (ICD-10-CM) - Slow transit constipation R35.0 (ICD-10-CM) - Urinary frequency  THERAPY DIAG:  Muscle weakness (generalized)  Other muscle spasm  Unspecified lack of coordination  Rationale for Evaluation and Treatment: Rehabilitation  ONSET DATE: several years  SUBJECTIVE:                                                                                                                                                                                           SUBJECTIVE STATEMENT: Urine leakage about the same and pain with sex about the same    I urinate a lot, 2 hours, 1-2x nightly. Does have dribbling or urine sometimes.    Fluid intake: water - 8 oz bottles (8x day), a soda for dinner   PAIN:  Are you having pain? Yes NPRS scale: 9/10 Pain location: low back; head  Pain type: aching Pain description: constant, sharp, and burning   Aggravating factors: waking first thing usually has a headache. Walking, housework, lifting, standing around 5 mins then back back starts Relieving factors: nothing  PRECAUTIONS: None  RED FLAGS: None   WEIGHT BEARING RESTRICTIONS: No  FALLS:  Has patient fallen in last 6 months? No  OCCUPATION: not currently   ACTIVITY LEVEL : low  PLOF: Independent  PATIENT GOALS: to have less pain and less urine frequency and leakage  PERTINENT HISTORY:  Anxiety, Abnormal vaginal bleeding, COPD, Diabetes mellitus without complication type 2, Endometrial polyp, Fibroid uterus, CVA, HSV,  Internal hemorrhoids, Tachycardia, HYSTEROSCOPY, Ovarian cyst, right,  Sexual abuse: No  BOWEL MOVEMENT: Pain with bowel movement: No Type of bowel movement:Type (Bristol Stool Scale) 1-2, Frequency every other day, and Strain yes Fully empty rectum: No Leakage: No Pads: No Fiber supplement/laxative Yes stool softener 6-9 daily  URINATION: Pain with urination: No  Fully empty bladder: Yes:    Stream: Strong and Weak Urgency: Yes  Frequency: 7-8x daily; 1-2x nightly Leakage: unsure what causes it  Pads: Yes: 3 pads  INTERCOURSE:  Ability to have vaginal penetration Yes  Pain with intercourse: Initial Penetration, During Penetration, and Deep Penetration Dryness yes  Climax: difficult to achieve Marinoff Scale: 1/3 Lubricant - no uses estrogen    PREGNANCY: Vaginal deliveries 0 C-section deliveries 0 Currently pregnant No  PROLAPSE: Pressure - hard bowel movements    OBJECTIVE:  Note: Objective measures were completed at Evaluation unless otherwise noted.  DIAGNOSTIC FINDINGS:    PATIENT SURVEYS:    PFIQ-7: summary score 250 (UIQ-7 86,CRAIQ-7 81, POPIQ -7 86)  COGNITION: Overall cognitive status: Within functional limits for tasks assessed     SENSATION: Light touch: Appears intact  LUMBAR SPECIAL TESTS:  Single leg stance test: 1s on Rt, 3s Lt LOB noted immediately, SI Compression/distraction test: Negative, and FABER test: Negative  FUNCTIONAL TESTS:  Functional squat - forward trunk flexion, limited descent by 50%, bil knee valgus, and need of hand at thighs to return upright  Sit up test- 0/3  GAIT: Assistive device utilized: None Comments: decreased cadence, trunk flexion, poor arm swings, decreased step height bil and decreased stride length  POSTURE: rounded shoulders, forward head, and posterior pelvic tilt   LUMBARAROM/PROM:  A/PROM A/PROM  eval  Flexion Limited by 25%  Extension WFL  Right lateral flexion Limited by 50%  Left lateral flexion Limited by 50%  Right rotation Limited by 50%  Left rotation Limited by 50%   (Blank rows = not tested)  LOWER EXTREMITY ROM:  Bil hamstrings, adductors, hip IR, hip ER limited by 25%   LOWER EXTREMITY MMT:  Bil hips grossly 3/5, knees 4/5 PALPATION:   General: TTP at bil piriformis, tight bil thoracic and lumbar paraspinals  Pelvic Alignment: WFL  Abdominal: TTP and tension  throughout upper and lower abdominal quadrants                External Perineal Exam: dryness and redness noted in vulva, poor clitoral hood mobility with adhesion noted                             Internal Pelvic Floor: TTP throughout bil superficial and deep pelvic floor layers, poor tissue mobility throughout   Patient confirms identification and approves PT to assess internal pelvic floor and treatment Yes  PELVIC MMT:   MMT eval  Vaginal 2/5; 3s; 5 reps  Internal Anal Sphincter   External Anal Sphincter   Puborectalis   Diastasis Recti Bulging noted with sit up test but unable to palpate borders fully   (Blank rows = not tested)        TONE: Increased   PROLAPSE: Not seen in hooklying   TODAY'S TREATMENT:  DATE:    08/25/23: Hooklying diaphragmatic breathing with pelvic drops x20 Lumbar rotations 2x10 Butterfly 3x30s Single knee to chest 3x30s each Extra time spent on all stretches for relaxation techniques, decreased gripping in gluteals and thighs and abs Abdominal massage with handout given for improved peristalsis for decreased constipation Pt also educated on urge drill and bladder irritants - handouts given   08/14/23: Neuro reed- transverse abdominis breath with ball press supine, butterfly stretch, lateral trunk rotation Diaphragmatic breathing  There acts- education or pain free use of vibrator, TTNS, HEP, review of eval, goals  08/05/2023 EVAL Examination completed, findings reviewed, pt educated on POC, HEP, and feminine moisturizers. Pt motivated to participate in PT and agreeable to attempt recommendations.     PATIENT EDUCATION:  Education details: [email or web address]; urge drill, bladder irritants, abdominal massage Person educated: Patient Education method: Explanation, Demonstration, Tactile cues, Verbal cues, and Handouts Education  comprehension: verbalized understanding, returned demonstration, verbal cues required, tactile cues required, and needs further education  HOME EXERCISE PROGRAM: Access Code: [email or web address] URL: [email or web address] Date: 08/05/2023 Prepared by: [facility] - Outpatient Rehab - [facility]  Exercises - Supine Diaphragmatic Breathing  - 1 x daily - 7 x weekly - 1 sets - 10 reps - Supine Lower Trunk Rotation  - 1 x daily - 7 x weekly - 1 sets - 3 reps - 30s holds - Supine Butterfly Groin Stretch  - 1 x daily - 7 x weekly - 3 sets - 30s hold - Standing L  - 1 x daily - 7 x weekly - 1 sets - 3 reps - 30s holds  ASSESSMENT:  CLINICAL IMPRESSION: Session focused on stretching low back and hips for improved back and pelvic mobility for decreased pain and strain at pelvic floor. Pt tolerated well also educated on urge drill and bladder irritants and abdominal massage for improved bowel/bladder habits. Pt denied additional questions and reports her pain was much lower at end of session.   OBJECTIVE IMPAIRMENTS: decreased activity tolerance, decreased balance, decreased coordination, decreased endurance, decreased knowledge of condition, decreased mobility, difficulty walking, decreased ROM, decreased strength, dizziness, hypomobility, increased fascial restrictions, impaired perceived functional ability, increased muscle spasms, impaired flexibility, improper body mechanics, postural dysfunction, and pain.   ACTIVITY LIMITATIONS: carrying, lifting, squatting, stairs, continence, and locomotion level  PARTICIPATION LIMITATIONS: meal prep, cleaning, laundry, interpersonal relationship, community activity, and yard work  PERSONAL FACTORS: Age, Fitness, Past/current experiences, Time since onset of injury/illness/exacerbation, and 1 comorbidity: medical history are also affecting patient's functional outcome.   REHAB POTENTIAL: Good  CLINICAL DECISION MAKING:  Stable/uncomplicated  EVALUATION COMPLEXITY: Low   GOALS: Goals reviewed with patient? Yes  SHORT TERM GOALS: Target date: 08/03/23  Pt to be I with HEP for carry over and continuing recommendations for improved outcomes.   Baseline: Goal status: INITIAL  2.  Pt will be independent with the knack, urge suppression technique, and double voiding in order to improve bladder habits and decrease urinary incontinence.   Baseline:  Goal status: INITIAL  3.  Pt will be independent with use of squatty potty, relaxed toileting mechanics, and improved bowel movement techniques in order to increase ease of bowel movements and complete evacuation for health bowel habits.  Baseline:  Goal status: INITIAL  4.  Pt to demonstrate at least 4/5 bil hip strength for improved pelvic stability and functional squats without pain for I with transferring on/off the toilet.  Baseline:  Goal status: INITIAL  5.  Pt to be I with  mobility of pelvic floor with contract/relax/contract/bulge for decreased tension at pelvic floor and improved tolerance for vaginal penetration.  Baseline:  Goal status: INITIAL   LONG TERM GOALS: Target date: 11/04/23  Pt to be I with advanced HEP for carry over and continuing recommendations for improved outcomes.   Baseline:  Goal status: INITIAL  2.  Pt will have 50% less urgency due to bladder retraining and strengthening for confidence with leaving the house for community outings.  Baseline:  Goal status: INITIAL  3. Pt to demonstrate improved coordination of pelvic floor and breathing mechanics with 10# squat with appropriate synergistic patterns to decrease pain and leakage at least 75% of the time for improved ability to complete a 30 minute walk without strain at pelvic floor and symptoms.    Baseline:  Goal status: INITIAL  4.  Pt will report 3 BMs per week due to improved muscle tone and coordination with bowel movements for healthy bowel habits.  Baseline:   Goal status: INITIAL  5.  Pt to report no more than 2/10 pain with vaginal dilator size 6 or equivalent for improved tolerance to intercourse.  Baseline:  Goal status: INITIAL  6.  Pt to report improved time between bladder voids to at least 3 hours for improved QOL with decreased urinary frequency.   Baseline:  Goal status: INITIAL  PLAN:  PT FREQUENCY: 2x/week  PT DURATION: 8 sessions  PLANNED INTERVENTIONS: 97110-Therapeutic exercises, 97530- Therapeutic activity, 97112- Neuromuscular re-education, 97535- Self Care, 82956- Manual therapy, [phone]- Aquatic Therapy, [phone]- Electrical stimulation (manual), 97016- Vasopneumatic device, Patient/Family education, Taping, Dry Needling, Joint mobilization, Spinal mobilization, Scar mobilization, Vestibular training, DME instructions, Cryotherapy, Moist heat, and Biofeedback  PLAN FOR NEXT SESSION: internal as needed; hips and back and abdomen stretching, hip and core strengthening, urge drill, voiding mechanics, abdominal massage   Avie Lemme, PT, DPT 08/24/2508:59 AM

## 2023-08-25 NOTE — Patient Instructions (Signed)
 Urge Incontinence  Ideal urination frequency is every 2-4 wakeful hours, which equates to 5-8 times within a 24-hour period.   Urge incontinence is leakage that occurs when the bladder muscle contracts, creating a sudden need to go before getting to the bathroom.   Going too often when your bladder isn't actually full can disrupt the body's automatic signals to store and hold urine longer, which will increase urgency/frequency.  In this case, the bladder "is running the show" and strategies can be learned to retrain this pattern.   One should be able to control the first urge to urinate, at around .  The bladder can hold up to a "grande latte," or . To help you gain control, practice the Urge Drill below when urgency strikes.  This drill will help retrain your bladder signals and allow you to store and hold urine longer.  The overall goal is to stretch out your time between voids to reach a more manageable voiding schedule.    Practice your "quick flicks" often throughout the day (each waking hour) even when you don't need feel the urge to go.  This will help strengthen your pelvic floor muscles, making them more effective in controlling leakage.  Urge Drill  When you feel an urge to go, follow these steps to regain control: Stop what you are doing and be still Take one deep breath, directing your air into your abdomen Think an affirming thought, such as "I've got this." Do 5 quick flicks of your pelvic floor Walk with control to the bathroom to void, or delay voiding   Bladder Irritants  Certain foods and beverages can be irritating to the bladder.  Avoiding these irritants may decrease your symptoms of urinary urgency, frequency or bladder pain.  Even reducing your intake can help with your symptoms.  Not everyone is sensitive to all bladder irritants, so you may consider focusing on one irritant at a time, removing or reducing your intake of that irritant for 7-10 days to see if this  change helps your symptoms.  Water intake is also very important.  Below is a list of bladder irritants.  Drinks: alcohol, carbonated beverages, caffeinated beverages such as coffee and tea, drinks with artificial sweeteners, citrus juices, apple juice, tomato juice  Foods: tomatoes and tomato based foods, spicy food, sugar and artificial sweeteners, vinegar, chocolate, raw onion, apples, citrus fruits, pineapple, cranberries, tomatoes, strawberries, plums, peaches, cantaloupe  Other: acidic urine (too concentrated) - see water intake info below  Substitutes you can try that are NOT irritating to the bladder: cooked onion, pears, papayas, sun-brewed decaf teas, watermelons, non-citrus herbal teas, apricots, kava and low-acid instant drinks (Postum).    WATER INTAKE: Remember to drink lots of water (aim for fluid intake of half your body weight with 2/3 of fluids being water).  You may be limiting fluids due to fear of leakage, but this can actually worsen urgency symptoms due to highly concentrated urine.  Water helps balance the pH of your urine so it doesn't become too acidic - acidic urine is a bladder irritant!    3-5 mins am/pm lying down with gentle circles right to left.

## 2023-08-26 ENCOUNTER — Other Ambulatory Visit: Payer: Self-pay | Admitting: Obstetrics and Gynecology

## 2023-08-26 DIAGNOSIS — R9389 Abnormal findings on diagnostic imaging of other specified body structures: Secondary | ICD-10-CM

## 2023-08-27 ENCOUNTER — Encounter: Payer: Self-pay | Admitting: Obstetrics and Gynecology

## 2023-08-27 ENCOUNTER — Ambulatory Visit: Admitting: Obstetrics and Gynecology

## 2023-08-27 VITALS — BP 152/82 | HR 67

## 2023-08-27 DIAGNOSIS — N3941 Urge incontinence: Secondary | ICD-10-CM

## 2023-08-27 DIAGNOSIS — N3281 Overactive bladder: Secondary | ICD-10-CM | POA: Diagnosis not present

## 2023-08-27 MED ORDER — HYDRALAZINE HCL 50 MG PO TABS: 50.0000 mg | ORAL_TABLET | Freq: Three times a day (TID) | ORAL | Status: DC

## 2023-08-27 NOTE — Telephone Encounter (Signed)
 Pt returning call to a nurse. She states there was no message left on her vm. Please advise.

## 2023-08-27 NOTE — Progress Notes (Signed)
 [facility]  PTNS VISIT  CC:  Overactive bladder  68 y.o. with refractory overactive bladder who presents for percutaneous tibial nerve stimulation. The patient presents for PTNS session # 5.   Procedure: The patient was placed in the sitting position and the right lower extremity was prepped in the usual fashion. The PTNS needle was then inserted at a 60 degree angle, 5 cm cephalad and 2 cm posterior to the medial malleolus. The PTNS unit was then programmed and an optimal response was noted at 12 milliamps. The PTNS stimulation was then performed at this setting for 30 minutes without incident and the patient tolerated the procedure well. The needle was removed and hemostasis was noted.   The pt will return in 1 week for PTNS session # 6. All questions were answered.

## 2023-08-27 NOTE — Telephone Encounter (Signed)
 Called left message for patient . Per Dr Addie Holstein, in the future if blood pressure is elevated wait and check in 1 hour if blood pressure remains 160 or higher systolic , you may take an extra Additional dose hydralazine  50 mg.  Any question may call back.

## 2023-08-27 NOTE — Telephone Encounter (Signed)
 Spoke with patient and she is aware of Dr. Addie Holstein recommendations for her elevated BP. She states she will continue to monitor

## 2023-08-29 ENCOUNTER — Other Ambulatory Visit: Payer: Self-pay | Admitting: Nurse Practitioner

## 2023-08-31 ENCOUNTER — Other Ambulatory Visit: Payer: Self-pay | Admitting: Medical Genetics

## 2023-09-01 ENCOUNTER — Ambulatory Visit
Admission: RE | Admit: 2023-09-01 | Discharge: 2023-09-01 | Disposition: A | Discharge status: Home / Self Care | Source: Ambulatory Visit | Attending: Obstetrics and Gynecology | Admitting: Obstetrics and Gynecology

## 2023-09-01 DIAGNOSIS — Z1231 Encounter for screening mammogram for malignant neoplasm of breast: Secondary | ICD-10-CM

## 2023-09-02 ENCOUNTER — Encounter: Payer: Self-pay | Admitting: Obstetrics and Gynecology

## 2023-09-02 ENCOUNTER — Ambulatory Visit (INDEPENDENT_AMBULATORY_CARE_PROVIDER_SITE_OTHER): Admitting: Obstetrics and Gynecology

## 2023-09-02 VITALS — BP 105/70 | HR 73

## 2023-09-02 DIAGNOSIS — N3281 Overactive bladder: Secondary | ICD-10-CM | POA: Diagnosis not present

## 2023-09-02 DIAGNOSIS — N3941 Urge incontinence: Secondary | ICD-10-CM

## 2023-09-02 NOTE — Progress Notes (Signed)
 [facility]  PTNS VISIT  CC:  Overactive bladder  68 y.o. with refractory overactive bladder who presents for percutaneous tibial nerve stimulation. The patient presents for PTNS session # 6.   Procedure: The patient was placed in the sitting position and the right lower extremity was prepped in the usual fashion. The PTNS needle was then inserted at a 60 degree angle, 5 cm cephalad and 2 cm posterior to the medial malleolus. The PTNS unit was then programmed and an optimal response was noted at 15 milliamps. The PTNS stimulation was then performed at this setting for 30 minutes without incident and the patient tolerated the procedure well. The needle was removed and hemostasis was noted.   The pt will return in 1 week for PTNS session # 7. All questions were answered.

## 2023-09-04 ENCOUNTER — Ambulatory Visit: Payer: Self-pay | Admitting: Obstetrics and Gynecology

## 2023-09-08 ENCOUNTER — Ambulatory Visit (INDEPENDENT_AMBULATORY_CARE_PROVIDER_SITE_OTHER): Admitting: Obstetrics and Gynecology

## 2023-09-08 ENCOUNTER — Encounter: Payer: Self-pay | Admitting: Obstetrics and Gynecology

## 2023-09-08 VITALS — BP 129/86 | HR 71

## 2023-09-08 DIAGNOSIS — N3281 Overactive bladder: Secondary | ICD-10-CM

## 2023-09-08 NOTE — Progress Notes (Signed)
 [facility]  PTNS VISIT  CC:  Overactive bladder  68 y.o. with refractory overactive bladder who presents for percutaneous tibial nerve stimulation. The patient presents for PTNS session # 7.   Procedure: The patient was placed in the sitting position and the right lower extremity was prepped in the usual fashion. The PTNS needle was then inserted at a 60 degree angle, 5 cm cephalad and 2 cm posterior to the medial malleolus. The PTNS unit was then programmed and an optimal response was noted at 14 milliamps. The PTNS stimulation was then performed at this setting for 30 minutes without incident and the patient tolerated the procedure well. The needle was removed and hemostasis was noted.   The pt will return in 1 week for PTNS session # 8. All questions were answered.

## 2023-09-18 ENCOUNTER — Encounter: Payer: Self-pay | Admitting: Obstetrics and Gynecology

## 2023-09-18 ENCOUNTER — Ambulatory Visit: Admitting: Obstetrics and Gynecology

## 2023-09-18 VITALS — BP 103/71 | HR 96 | Ht 62.0 in | Wt 194.0 lb

## 2023-09-18 DIAGNOSIS — N882 Stricture and stenosis of cervix uteri: Secondary | ICD-10-CM | POA: Diagnosis not present

## 2023-09-18 DIAGNOSIS — R9389 Abnormal findings on diagnostic imaging of other specified body structures: Secondary | ICD-10-CM | POA: Diagnosis not present

## 2023-09-18 MED ORDER — MISOPROSTOL 200 MCG PO TABS: ORAL_TABLET | ORAL | 0 refills | Status: DC

## 2023-09-18 NOTE — Progress Notes (Signed)
 68 y.o. GYN presents for Endo Biopsy for Postmenopausal bleeding.

## 2023-09-21 ENCOUNTER — Ambulatory Visit: Admitting: Physical Therapy

## 2023-09-22 ENCOUNTER — Encounter: Payer: Self-pay | Admitting: Obstetrics and Gynecology

## 2023-09-22 ENCOUNTER — Other Ambulatory Visit (HOSPITAL_COMMUNITY)

## 2023-09-22 NOTE — Progress Notes (Signed)
 NEW GYNECOLOGY PATIENT Patient name: Grace [name] MRN [id number]  Date of birth: 10/13/1955 Chief Complaint:   ENDOMETRIAL BIOPSY     History:  Grace [name] is a 68 y.o. G0P0000 being seen today for EMB.  Discussed the use of AI scribe software for clinical note transcription with the patient, who gave verbal consent to proceed.  History of Present Illness Grace [name] is a 68 year old female with fibroid tumors who presents with abnormal vaginal discharge.  She experiences a tan discharge, sometimes with pink. An ultrasound revealed a thickened endometrial lining. Despite being in menopause, she continues to have fibroids and has a small cyst on her ovary and cervix. She has a history of two myomectomies performed many years ago.  Attempts to obtain a biopsy were unsuccessful due to the small size of the cervix. She is concerned about the possibility of cancer, although there is no family history of cancer. Her mother had a hysterectomy due to fibroids. She is reluctant to undergo anesthesia due to her COPD and reports discomfort during previous attempts to access the cervix for biopsy.  She has been using estrogen cream and has dry areas on her vulva. She has not engaged in sexual intercourse for nearly twenty years, partly due to her partner's heart condition and resulting low testosterone.       Gynecologic History Patient's last menstrual period was 02/27/2014. Contraception: post menopausal status Last Pap:     Component Value Date/Time   DIAGPAP - Low grade squamous intraepithelial lesion (LSIL) (A) 08/13/2023 1401   ADEQPAP  08/13/2023 1401    Satisfactory for evaluation; transformation zone component ABSENT.   Last Mammogram: 08/2023 BIRADS 1 Last Colonoscopy: 2024   The following portions of the patient's history were reviewed and updated as appropriate: allergies, current medications, past family history, past medical history, past social history, past  surgical history and problem list.  Review of Systems Pertinent items noted in HPI and remainder of comprehensive ROS otherwise negative.  Physical Exam:  BP 103/71   Pulse 96   Ht 5\' 2"  (1.575 m)   Wt 194 lb (88 kg)   LMP 02/27/2014   BMI 35.48 kg/m  Physical Exam Vitals and nursing note reviewed. Exam conducted with a chaperone present.  Constitutional:      Appearance: Normal appearance.  Pulmonary:     Effort: Pulmonary effort is normal.  Abdominal:     Palpations: Abdomen is soft.  Genitourinary:    General: Normal vulva.     Exam position: Lithotomy position.     Comments: Atrophic cervix with stenotic external os Neurological:     Mental Status: She is alert.    Endometrial Biopsy Procedure  Patient identified, informed consent performed,  indication reviewed, consent signed.  Reviewed risk of perforation, pain, bleeding, insufficient sample, etc were reviewd. Time out was performed.  Urine pregnancy test negative.  Speculum placed in the vagina.  Cervix visualized.  Cleaned with Betadine x 2.  Anterior cervix grasped anteriorly with a single tooth tenaculum.  Paracervical block was administered.  The cervix was attempted to be entered with the pipelle which was not successful. Attempted to pass dilator without success. Attempted to locate area for passage with spinal needle without success. After multiple attempts, decision made to abort procedure. Tenaculum was removed, good hemostasis noted.  Patient tolerated procedure well.  Patient was given post-procedure instructions.    Assessment and Plan:   Assessment & Plan Postmenopausal Bleeding Ultrasound shows  thickened endometrial lining, raising suspicion for endometrial pathology. Differential includes endometrial atrophy, fibroids, or malignancy. Discussed biopsy options with local anesthesia due to her reluctance for general anesthesia. - Attempted endometrial biopsy today with local anesthesia using cervical block  injections, unsuccessful attempt - Recommend pre-treatment with cervical ripening agents for future attempts, cytotec  sent to pharmacy - Given personal history of COPD and prior respiratory failure, patient expresses reasonable concern for general anesthesia but open to IVCS as completed with coloscopies as she has previously tolerated that well  - surgical scheduling referral placed with special request for IV conscious sedation  Vulvovaginal Atrophy Condition likely related to postmenopausal changes. Advised on management strategies to maintain tissue elasticity. - Continue using estrogen cream as prescribed. - Continue using coconut oil for moisturizing. - Encourage daily use of a vibrator and other PFPT recommendations to maintain tissue elasticity.   Routine preventative health maintenance measures emphasized. Please refer to After Visit Summary for other counseling recommendations.   Follow-up: No follow-ups on file.      Kiki Pelton, MD Obstetrician & Gynecologist, Faculty Practice [facility] [employer], [facility]

## 2023-09-22 NOTE — H&P (View-Only) (Signed)
 NEW GYNECOLOGY PATIENT Patient name: Grace [name] MRN [id number]  Date of birth: 10/13/1955 Chief Complaint:   ENDOMETRIAL BIOPSY     History:  Grace [name] is a 68 y.o. G0P0000 being seen today for EMB.  Discussed the use of AI scribe software for clinical note transcription with the patient, who gave verbal consent to proceed.  History of Present Illness Grace [name] is a 68 year old female with fibroid tumors who presents with abnormal vaginal discharge.  She experiences a tan discharge, sometimes with pink. An ultrasound revealed a thickened endometrial lining. Despite being in menopause, she continues to have fibroids and has a small cyst on her ovary and cervix. She has a history of two myomectomies performed many years ago.  Attempts to obtain a biopsy were unsuccessful due to the small size of the cervix. She is concerned about the possibility of cancer, although there is no family history of cancer. Her mother had a hysterectomy due to fibroids. She is reluctant to undergo anesthesia due to her COPD and reports discomfort during previous attempts to access the cervix for biopsy.  She has been using estrogen cream and has dry areas on her vulva. She has not engaged in sexual intercourse for nearly twenty years, partly due to her partner's heart condition and resulting low testosterone.       Gynecologic History Patient's last menstrual period was 02/27/2014. Contraception: post menopausal status Last Pap:     Component Value Date/Time   DIAGPAP - Low grade squamous intraepithelial lesion (LSIL) (A) 08/13/2023 1401   ADEQPAP  08/13/2023 1401    Satisfactory for evaluation; transformation zone component ABSENT.   Last Mammogram: 08/2023 BIRADS 1 Last Colonoscopy: 2024   The following portions of the patient's history were reviewed and updated as appropriate: allergies, current medications, past family history, past medical history, past social history, past  surgical history and problem list.  Review of Systems Pertinent items noted in HPI and remainder of comprehensive ROS otherwise negative.  Physical Exam:  BP 103/71   Pulse 96   Ht 5\' 2"  (1.575 m)   Wt 194 lb (88 kg)   LMP 02/27/2014   BMI 35.48 kg/m  Physical Exam Vitals and nursing note reviewed. Exam conducted with a chaperone present.  Constitutional:      Appearance: Normal appearance.  Pulmonary:     Effort: Pulmonary effort is normal.  Abdominal:     Palpations: Abdomen is soft.  Genitourinary:    General: Normal vulva.     Exam position: Lithotomy position.     Comments: Atrophic cervix with stenotic external os Neurological:     Mental Status: She is alert.    Endometrial Biopsy Procedure  Patient identified, informed consent performed,  indication reviewed, consent signed.  Reviewed risk of perforation, pain, bleeding, insufficient sample, etc were reviewd. Time out was performed.  Urine pregnancy test negative.  Speculum placed in the vagina.  Cervix visualized.  Cleaned with Betadine x 2.  Anterior cervix grasped anteriorly with a single tooth tenaculum.  Paracervical block was administered.  The cervix was attempted to be entered with the pipelle which was not successful. Attempted to pass dilator without success. Attempted to locate area for passage with spinal needle without success. After multiple attempts, decision made to abort procedure. Tenaculum was removed, good hemostasis noted.  Patient tolerated procedure well.  Patient was given post-procedure instructions.    Assessment and Plan:   Assessment & Plan Postmenopausal Bleeding Ultrasound shows  thickened endometrial lining, raising suspicion for endometrial pathology. Differential includes endometrial atrophy, fibroids, or malignancy. Discussed biopsy options with local anesthesia due to her reluctance for general anesthesia. - Attempted endometrial biopsy today with local anesthesia using cervical block  injections, unsuccessful attempt - Recommend pre-treatment with cervical ripening agents for future attempts, cytotec  sent to pharmacy - Given personal history of COPD and prior respiratory failure, patient expresses reasonable concern for general anesthesia but open to IVCS as completed with coloscopies as she has previously tolerated that well  - surgical scheduling referral placed with special request for IV conscious sedation  Vulvovaginal Atrophy Condition likely related to postmenopausal changes. Advised on management strategies to maintain tissue elasticity. - Continue using estrogen cream as prescribed. - Continue using coconut oil for moisturizing. - Encourage daily use of a vibrator and other PFPT recommendations to maintain tissue elasticity.   Routine preventative health maintenance measures emphasized. Please refer to After Visit Summary for other counseling recommendations.   Follow-up: No follow-ups on file.      Kiki Pelton, MD Obstetrician & Gynecologist, Faculty Practice [facility] [employer], [facility]

## 2023-09-25 ENCOUNTER — Encounter: Admitting: Physical Therapy

## 2023-09-25 ENCOUNTER — Other Ambulatory Visit (HOSPITAL_COMMUNITY)

## 2023-09-28 ENCOUNTER — Other Ambulatory Visit: Payer: Self-pay | Admitting: Nurse Practitioner

## 2023-09-28 DIAGNOSIS — J019 Acute sinusitis, unspecified: Secondary | ICD-10-CM

## 2023-09-29 LAB — HEMOGLOBIN A1C: Hemoglobin A1C: 5.7

## 2023-09-30 ENCOUNTER — Encounter: Payer: Self-pay | Admitting: Obstetrics and Gynecology

## 2023-09-30 ENCOUNTER — Ambulatory Visit
Admission: RE | Admit: 2023-09-30 | Discharge: 2023-09-30 | Disposition: A | Discharge status: Home / Self Care | Source: Ambulatory Visit

## 2023-09-30 ENCOUNTER — Encounter: Admitting: Physical Therapy

## 2023-09-30 DIAGNOSIS — Z87891 Personal history of nicotine dependence: Secondary | ICD-10-CM

## 2023-09-30 DIAGNOSIS — Z122 Encounter for screening for malignant neoplasm of respiratory organs: Secondary | ICD-10-CM

## 2023-10-02 ENCOUNTER — Telehealth: Payer: Self-pay

## 2023-10-02 ENCOUNTER — Encounter: Admitting: Physical Therapy

## 2023-10-02 NOTE — Telephone Encounter (Signed)
 I left the patient a voicemail advising I was able to find 1 open spot for this Tues(10/06/23) at [facility] at 1:45 pm w/ Dr. Elester Grim. I let the patient know this was her only opening through July. I asked patient to call me back as soon as possible to confirm her availability. [phone]

## 2023-10-05 ENCOUNTER — Encounter (HOSPITAL_COMMUNITY): Payer: Self-pay | Admitting: Obstetrics and Gynecology

## 2023-10-05 ENCOUNTER — Other Ambulatory Visit: Payer: Self-pay

## 2023-10-05 ENCOUNTER — Telehealth: Payer: Self-pay | Admitting: Obstetrics and Gynecology

## 2023-10-05 NOTE — Telephone Encounter (Signed)
 Called and confirmed ID x2. Discussed code status, patient had previously noted to be DNR but verbally expressed would like it reversed for procedure tomorrow and would like to be full code in the case of cardiopulmonary arrest. Full code order placed with preop orders

## 2023-10-05 NOTE — Progress Notes (Signed)
 Patient updated on new arrival time for procedure on 10/06/2023. Patient to arrive at 1015. Patient verbalized understanding. No further questions asked.

## 2023-10-05 NOTE — Progress Notes (Signed)
 Case: [id number] Date/Time: 10/06/23 1343   Procedure: DILATION AND CURETTAGE - ANESTHESIA REQUEST TO BE UNDER IV CONSCIOUS SEDATION LIKE FOR A COLONOSCOPY IF POSSIBLE   Anesthesia type: IV Sedation ([facility] Only)   Diagnosis:      Cervical stenosis (uterine cervix) [N88.2]     Thickened endometrium [R93.89]     Post-menopausal bleeding [N95.0]   Pre-op diagnosis:      Cervical stenosis      thickened endometrium   [facility]   Surgeons: Jeralyn Crutch, MD       DISCUSSION: Grace [name] is a 68 yo female who is a SDW prior to surgery above PMH of former smoking, HTN, CAD (by CT), asthma, COPD (stage 3, severe), LLL lung mass (s/p video bronchoscopy/EBUS 03/15/20: no malignant cells), OSA (CPAP intolerance), migraine, hx of CVA (2002), GERD, hiatal hernia, PUD, Barrett's esopagus, hypothyroid, T2DM, chronic pain.  Prior anesthesia complications include hx of failed conscious sedation in 2013.Pt also reports hx of respiratory failure after general anesthesia for lap appy in 2017 requiring bipap and treatment of COPD exacerbation.  Patient follows with Cardiology for hx of severe CAD by CTA. Coronary CT angiogram in 08/2022 revealed coronary calcium  score 2262 (99th percentile), extensive TPV, severe CAD of the mid RCA with abnormal FFR, mild proximal LAD and left circumflex stenoses, possible significant disease of the mid LAD to distal LAD (not mottled by FFR).  Findings were discussed with Dr. Anner in the absence of symptoms, initial trial of medical therapy was advised. She has stable chronic dyspnea in the setting of COPD, otherwise stable with no anginal symptoms.  Should she develop progressive symptoms despite medical therapy, consider need for further ischemic evaluation (possible catheterization). Continue aspirin , bisoprolol , losartan , hydralazine , Imdur , Zetia , and Repatha .  Patient reports stable SOB during PAT phone interview. No chest pain with exertion.  Patient is able to perform ADLs and ambulate short distances. She is unable to do a flight of stairs.  Patient previously followed with pulmonology due to severe COPD.  Last seen in 02/2023.  She is on inhalers.  She was advised to do PFTs but this has not been completed yet.  She was advised to restart her CPAP for OSA however patient is having difficulty tolerating the mask.  LD Ozempic: 6/22. Pt was not instructed to hold.   Discussed with Dr. Maryclare who advised to let patient know she will likely be switched to general anesthesia for airway protection. Discussed with patient on the phone. She says she is ok with general but is just anxious about it since she had the complication in 2017. She confirms no recently COPD exacerbation or URI.  VS: LMP 02/27/2014   PROVIDERS: Delores Rojelio Caldron, NP   LABS: Labs DOS   IMAGES:  CT Chest 09/30/23:  IMPRESSION: 1. Lung-RADS 2S, benign appearance or behavior. Continue annual screening with low-dose chest CT without contrast in 12 months. 2. The S modifier above refers to potentially clinically significant non lung cancer related findings. Specifically, there is aortic atherosclerosis, in addition to left main and three-vessel coronary artery disease. Please note that although the presence of coronary artery calcium  documents the presence of coronary artery disease, the severity of this disease and any potential stenosis cannot be assessed on this non-gated CT examination. Assessment for potential risk factor modification, dietary therapy or pharmacologic therapy may be warranted, if clinically indicated. 3. Mild diffuse bronchial wall thickening with very mild centrilobular and paraseptal emphysema; imaging  findings suggestive of underlying COPD.   Aortic Atherosclerosis (ICD10-I70.0) and Emphysema (ICD10-J43.9).    EKG 02/28/2023:  Sinus rhythm, rate 80 Atrial premature complex  CV: Coronary CTA 08/29/22:  IMPRESSION: 1.  Coronary calcium  score of 2262. This was 14 percentile for age and sex matched control.   2. Total plaque volume (TPV) 1356 mm3 which is 97 percentile for age-and sex matched controls (calcified plaque 526 mm3; non-calcified plaque 830 mm3). TPV is extensive.   3. Normal coronary origin with right dominance.   4. Severe CAD of mid RCA (FFR abnormal), mild proximal LAD and CX. Possible significant disease of mid LAD to distal (not modeled by FFR).  IMPRESSION: 1.  Abnormal RCA FFR 0.65 in mid region.  Echo 07/30/2022:  Conclusions:  Normal LV systolic function with EF 60 to 65% Left ventricular cavity is normal in size Normal global wall motion Mild LVH Grade 1 diastolic dysfunction Right ventricle cavity is normal in size Normal right ventricular function No significant valve disease  Past Medical History:  Diagnosis Date   Abnormal vaginal bleeding    Anxiety    Asthma    Barrett's esophagus    with high grade dysplasia per endoscopy 05/2005 // followed by Dr. Lupita Commander ([facility])   Chronic back pain    COPD (chronic obstructive pulmonary disease) (HCC)    Diabetes mellitus without complication (HCC)    type 2   Duodenal ulcer disease    thought to be contributed at least partly by overuse of NSAIDs and salicylates (goody powdr)   Endometrial polyp 03/2003   s/p resection in 03/2003. 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   Migraine    Nevus    Personal history of failed conscious sedation 08/11/2011   Sleep apnea    does not use cpap   Stroke (HCC) 10/2000   Tachycardia    Thyroid  disease    Tobacco abuse 04/2012   quit    Past Surgical History:  Procedure Laterality Date   ADENOIDECTOMY     APPENDECTOMY  2017   BALLOON DILATION N/A 06/14/2020   Procedure: BALLOON DILATION;  Surgeon: Wilhelmenia Aloha Raddle., MD;  Location: [facility] ENDOSCOPY;  Service: Gastroenterology;  Laterality: N/A;   BIOPSY  02/15/2020   Procedure: BIOPSY;  Surgeon: Burnette Fallow, MD;  Location: [facility] ENDOSCOPY;  Service: Endoscopy;;   BIOPSY  06/14/2020   Procedure: BIOPSY;  Surgeon: Wilhelmenia Aloha Raddle., MD;  Location: [facility] ENDOSCOPY;  Service: Gastroenterology;;   BIOPSY  03/02/2023   Procedure: BIOPSY;  Surgeon: Saintclair Jasper, MD;  Location: [facility] ENDOSCOPY;  Service: Gastroenterology;;   BRONCHIAL BIOPSY  03/15/2020   Procedure: BRONCHIAL BIOPSIES;  Surgeon: Brenna Adine CROME, DO;  Location: [facility] ENDOSCOPY;  Service: Pulmonary;;  BRONCHIAL BRUSHINGS  03/15/2020   Procedure: BRONCHIAL BRUSHINGS;  Surgeon: Brenna Adine CROME, DO;  Location: [facility]OS[facility];  Service: Pulmonary;;   BRONCHIAL NEEDLE ASPIRATION BIOPSY  03/15/2020   Procedure: BRONCHIAL NEEDLE ASPIRATION BIOPSIES;  Surgeon: Brenna Adine CROME, DO;  Location: [facility]OS[facility];  Service: Pulmonary;;   BRONCHIAL WASHINGS  03/15/2020   Procedure: BRONCHIAL WASHINGS;  Surgeon: Brenna Adine CROME, DO;  [facility];  Service: Pulmonary;;   CHOLECYSTECTOMY     COLONOSCOPY  02/07/2010   COLONOSCOPY WITH PROPOFOL  N/A 02/15/2020   Procedure: COLONOSCOPY WITH PROPOFOL ;  Surgeon: Burnette Fallow, MD;  Location: [facility];  Service: Endoscopy;  Laterality: N/A;   DILATION AND CURETTAGE OF UTERUS     ENDOSCOPIC MUCOSAL RESECTION N/A 06/14/2020   Procedure: ENDOSCOPIC MUCOSAL  RESECTION;  Surgeon: Wilhelmenia Aloha Raddle., MD;  Location: [facility] ENDOSCOPY;  Service: Gastroenterology;  Laterality: N/A;   ESOPHAGOGASTRODUODENOSCOPY (EGD) WITH PROPOFOL  N/A 02/15/2020   Procedure: ESOPHAGOGASTRODUODENOSCOPY (EGD) WITH PROPOFOL ;  Surgeon: Burnette Fallow, MD;  Location: [facility];  Service: Endoscopy;  Laterality: N/A;   ESOPHAGOGASTRODUODENOSCOPY (EGD) WITH PROPOFOL  N/A 06/14/2020   Procedure: ESOPHAGOGASTRODUODENOSCOPY (EGD) WITH PROPOFOL ;  Surgeon: Wilhelmenia Aloha Raddle., MD;  Location: [facility] ENDOSCOPY;  Service: Gastroenterology;  Laterality: N/A;   ESOPHAGOGASTRODUODENOSCOPY (EGD) WITH PROPOFOL  Left 03/02/2023   Procedure: ESOPHAGOGASTRODUODENOSCOPY (EGD) WITH PROPOFOL ;  Surgeon: Saintclair Jasper, MD;  Location: [facility] ENDOS[facility];  Service: Gastroenterology;  Laterality: Left;   FOREIGN BODY REMOVAL  06/14/2020   Procedure: FOREIGN BODY REMOVAL;  Surgeon: Wilhelmenia Aloha Raddle., MD;  Location: [facility] ENDOSCOPY;  Service: Gastroenterology;;   HYSTEROSCOPY     LAPAROSCOPIC APPENDECTOMY N/A 09/23/2015   Procedure: APPENDECTOMY LAPAROSCOPIC;  Surgeon: Dann Hummer, MD;  Location: [facility] OR;  Service: General;  Laterality: N/A;   MYOMECTOMY  1990, 1997   x 2 - In 1997, noted to have extensive pelvic adhesions and BL tubal obstruction   POLYPECTOMY  02/15/2020   Procedure: POLYPECTOMY;  Surgeon: Burnette Fallow, MD;  Location: [facility] ENDOSCOPY;  Service: Endoscopy;;   TONSILLECTOMY     TRANSTHORACIC ECHOCARDIOGRAM  06/2014    EF 55-60%.  No RWMA. Gr 1 DD. Cannot R/o PFO (consider bubble study).   UPPER ESOPHAGEAL ENDOSCOPIC ULTRASOUND (EUS) N/A 06/14/2020   Procedure: UPPER ESOPHAGEAL ENDOSCOPIC ULTRASOUND (EUS);  Surgeon: Wilhelmenia Aloha Raddle., MD;  Location: [facility] ENDOSCOPY;  Service: Gastroenterology;  Laterality: N/A;   UPPER GASTROINTESTINAL ENDOSCOPY  12/18/2009   VIDEO BRONCHOSCOPY WITH ENDOBRONCHIAL NAVIGATION N/A 03/15/2020   Procedure: VIDEO BRONCHOSCOPY WITH ENDOBRONCHIAL NAVIGATION;  Surgeon:  Brenna Adine CROME, DO;  Location[facility] ENDOS[facility];  Service: Pulmonary;  Laterality: N/A;   VIDEO BRONCHOSCOPY WITH ENDOBRONCHIAL ULTRASOUND  03/15/2020   Procedure: VIDEO BRONCHOSCOPY WITH ENDOBRONCHIAL ULTRASOUND;  Surgeon: Brenna Adine CROME, DO;  Location: [facility] ENDOSCOPY;  Service: Pulmonary;;   WISDOM TOOTH EXTRACTION      MEDICATIONS: No current facility-administered medications for this encounter.    albuterol  (PROAIR  HFA) 108 (90 Base) MCG/ACT inhaler   Aspirin -Acetaminophen -Caffeine (GOODY HEADACHE PO)   bisoprolol  (ZEBETA ) 10 MG tablet   Budeson-Glycopyrrol-Formoterol  (BREZTRI  AEROSPHERE) 160-9-4.8 MCG/ACT AERO   calcium  carbonate (TUMS - DOSED IN MG ELEMENTAL CALCIUM ) 500 MG chewable tablet   Collagen-Vitamin C-Biotin (COLLAGEN PO)   diphenhydrAMINE  (BENADRYL ) 25 MG tablet   docusate sodium  (COLACE) 100 MG capsule   estradiol  (ESTRACE  VAGINAL) 0.1 MG/GM vaginal cream   ezetimibe  (ZETIA ) 10 MG tablet   fluticasone  (FLONASE ) 50 MCG/ACT nasal spray   furosemide  (LASIX ) 40 MG tablet   hydrALAZINE  (APRESOLINE ) 50 MG tablet  ibuprofen (ADVIL) 200 MG tablet   ipratropium-albuterol  (DUONEB) 0.5-2.5 (3) MG/3ML SOLN   isosorbide  mononitrate (IMDUR ) 30 MG 24 hr tablet   levothyroxine  (SYNTHROID ) 200 MCG tablet   losartan  (COZAAR ) 100 MG tablet   Multiple Vitamins-Minerals (HAIR SKIN & NAILS) TABS   nitroGLYCERIN  (NITROSTAT ) 0.4 MG SL tablet   omeprazole  (PRILOSEC) 40 MG capsule   OZEMPIC, 0.25 OR 0.5 MG/DOSE, 2 MG/3ML SOPN   pregabalin  (LYRICA ) 50 MG capsule   REPATHA  SURECLICK 140 MG/ML SOAJ   traMADol  (ULTRAM ) 50 MG tablet   valACYclovir  (VALTREX ) 500 MG tablet   Vibegron  75 MG TABS   aspirin  EC 81 MG tablet   glipiZIDE  (GLUCOTROL  XL) 5 MG 24 hr tablet   glucose blood (ONETOUCH VERIO) test strip   Lancets (ONETOUCH DELICA PLUS LANCET33G) MISC   misoprostol  (CYTOTEC ) 200 MCG tablet   pantoprazole  (PROTONIX ) 40 MG tablet   Respiratory Therapy Supplies (NEBULIZER/TUBING/MOUTHPIECE)  KIT   Burnard CHRISTELLA Odis DEVONNA [facility] Short Stay/Anesthesiology [facility] Phone [phone] 10/05/2023 3:07 PM

## 2023-10-05 NOTE — Anesthesia Preprocedure Evaluation (Signed)
 Anesthesia Evaluation    Airway        Dental   Pulmonary former smoker          Cardiovascular hypertension,      Neuro/Psych    GI/Hepatic   Endo/Other  diabetes    Renal/GU      Musculoskeletal   Abdominal   Peds  Hematology   Anesthesia Other Findings   Reproductive/Obstetrics                              Anesthesia Physical Anesthesia Plan  ASA:   Anesthesia Plan:    Post-op Pain Management:    Induction:   PONV Risk Score and Plan:   Airway Management Planned:   Additional Equipment:   Intra-op Plan:   Post-operative Plan:   Informed Consent:   Plan Discussed with:   Anesthesia Plan Comments: (See PAT note from 6/23. Pt took Ozempic on 6/22. Advised she would likely be switched to GA. Pt was agreeable)         Anesthesia Quick Evaluation

## 2023-10-05 NOTE — Progress Notes (Addendum)
 PCP - Delores Rojelio Caldron, NP  Cardiologist - Anner Alm ORN, MD   PPM/ICD - denies Device Orders - n/a Rep Notified - n/a  Chest x-ray - 01-09-23 EKG - 02-28-23 Stress Test - 01-04-19 ECHO - 07-30-22 Cardiac Cath -  PFT- 03-30-20  CPAP - does not use  GLP-1 -OZEMPIC last dose 10-04-23  Fasting Blood Sugar - per patient MD monitors A1c. Per patient last A1c 6.2 in May   Blood Thinner Instructions: denies Aspirin  Instructions: Last dose 10-02-23  ERAS Protcol - Clear liquids until 10:15 am.  COVID TEST- n/a  Anesthesia review: yes hx of HTN, Dm, Asthma, OSA. Per patient she denies any new or worsening cardiac symptoms or shortness of breath. Denies being able to walk up a flight of stairs.    Dr. Jeralyn made aware of last dose of ozempic and Asa  Patient verbally denies any shortness of breath, fever, cough and chest pain during phone call   -------------  SDW INSTRUCTIONS given:  Your procedure is scheduled on October 06, 2023.  Report to [facility] Main Entrance A at 11:15 A.M., and check in at the Admitting office.  Call this number if you have problems the morning of surgery:  [phone]   Remember:  Do not eat after midnight the night before your surgery  You may drink clear liquids until 10:15 the morning of your surgery.   Clear liquids allowed are: Water, Non-Citrus Juices (without pulp), Carbonated Beverages, Clear Tea, Black Coffee Only, and Gatorade    Take these medicines the morning of surgery with A SIP OF WATER  bisoprolol  (ZEBETA )  albuterol  (PROAIR  HFA) inhaler  BREZTRI  AEROSPHERE)  ezetimibe  (ZETIA )  fluticasone  (FLONASE )  hydrALAZINE  (APRESOLINE )  ipratropium-albuterol  (DUONEB)  isosorbide  mononitrate (IMDUR )  levothyroxine  (SYNTHROID )  nitroGLYCERIN  (NITROSTAT )  omeprazole  (PRILOSEC)  pregabalin  (LYRICA )  traMADol  (ULTRAM )  valACYclovir  (VALTREX )  Vibegron     As of today, STOP taking any Aspirin  (unless otherwise instructed by your  surgeon) Aleve, Naproxen, Ibuprofen, Motrin, Advil, Goody's, BC's, all herbal medications, fish oil, and all vitamins.       WHAT DO I DO ABOUT MY DIABETES MEDICATION?   Do not take oral diabetes medicines (pills) the morning of surgery.  OZEMPIC last dose 10-04-23  The day of surgery, do not take other diabetes injectables, including Byetta (exenatide), Bydureon (exenatide ER), Victoza (liraglutide), or Trulicity (dulaglutide).  If your CBG is greater than 220 mg/dL, you may take  of your sliding scale (correction) dose of insulin .   HOW TO MANAGE YOUR DIABETES BEFORE AND AFTER SURGERY  Why is it important to control my blood sugar before and after surgery? Improving blood sugar levels before and after surgery helps healing and can limit problems. A way of improving blood sugar control is eating a healthy diet by:  Eating less sugar and carbohydrates  Increasing activity/exercise  Talking with your doctor about reaching your blood sugar goals High blood sugars (greater than 180 mg/dL) can raise your risk of infections and slow your recovery, so you will need to focus on controlling your diabetes during the weeks before surgery. Make sure that the doctor who takes care of your diabetes knows about your planned surgery including the date and location.  How do I manage my blood sugar before surgery? Check your blood sugar at least 4 times a day, starting 2 days before surgery, to make sure that the level is not too high or low.  Check your blood sugar the morning of your surgery  when you wake up and every 2 hours until you get to the Short Stay unit.  If your blood sugar is less than 70 mg/dL, you will need to treat for low blood sugar: Do not take insulin . Treat a low blood sugar (less than 70 mg/dL) with  cup of clear juice (cranberry or apple), 4 glucose tablets, OR glucose gel. Recheck blood sugar in 15 minutes after treatment (to make sure it is greater than 70 mg/dL). If your  blood sugar is not greater than 70 mg/dL on recheck, call [phone] for further instructions. Report your blood sugar to the short stay nurse when you get to Short Stay.  If you are admitted to the hospital after surgery: Your blood sugar will be checked by the staff and you will probably be given insulin  after surgery (instead of oral diabetes medicines) to make sure you have good blood sugar levels. The goal for blood sugar control after surgery is 80-180 mg/dL.                Do not wear jewelry, make up, or nail polish            Do not wear lotions, powders, perfumes/colognes, or deodorant.            Do not shave 48 hours prior to surgery.  Men may shave face and neck.            Do not bring valuables to the hospital.            [facility] is not responsible for any belongings or valuables.  Do NOT Smoke (Tobacco/Vaping) 24 hours prior to your procedure If you use a CPAP at night, you may bring all equipment for your overnight stay.   Contacts, glasses, dentures or bridgework may not be worn into surgery.      For patients admitted to the hospital, discharge time will be determined by your treatment team.   Patients discharged the day of surgery will not be allowed to drive home, and someone needs to stay with them for 24 hours.    Special instructions:   [facility]- Preparing For Surgery  Before surgery, you can play an important role. Because skin is not sterile, your skin needs to be as free of germs as possible. You can reduce the number of germs on your skin by washing with CHG (chlorahexidine gluconate) Soap before surgery.  CHG is an antiseptic cleaner which kills germs and bonds with the skin to continue killing germs even after washing.    Oral Hygiene is also important to reduce your risk of infection.  Remember - BRUSH YOUR TEETH THE MORNING OF SURGERY WITH YOUR REGULAR TOOTHPASTE  Please do not use if you have an allergy to CHG or antibacterial soaps. If your  skin becomes reddened/irritated stop using the CHG.  Do not shave (including legs and underarms) for at least 48 hours prior to first CHG shower. It is OK to shave your face.  Please follow these instructions carefully.   Shower the NIGHT BEFORE SURGERY and the MORNING OF SURGERY with DIAL Soap.   Pat yourself dry with a CLEAN TOWEL.  Wear CLEAN PAJAMAS to bed the night before surgery  Place CLEAN SHEETS on your bed the night of your first shower and DO NOT SLEEP WITH PETS.   Day of Surgery: Please shower morning of surgery  Wear Clean/Comfortable clothing the morning of surgery Do not apply any deodorants/lotions.   Remember to  brush your teeth WITH YOUR REGULAR TOOTHPASTE.   Questions were answered. Patient verbalized understanding of instructions.

## 2023-10-06 ENCOUNTER — Encounter (HOSPITAL_COMMUNITY): Payer: Self-pay | Admitting: Medical

## 2023-10-06 ENCOUNTER — Ambulatory Visit (HOSPITAL_COMMUNITY)
Admission: RE | Admit: 2023-10-06 | Discharge: 2023-10-06 | Disposition: A | Discharge status: Home / Self Care | Attending: Obstetrics and Gynecology | Admitting: Obstetrics and Gynecology

## 2023-10-06 ENCOUNTER — Encounter: Admitting: Physical Therapy

## 2023-10-06 ENCOUNTER — Encounter (HOSPITAL_COMMUNITY): Payer: Self-pay | Admitting: Obstetrics and Gynecology

## 2023-10-06 ENCOUNTER — Encounter (HOSPITAL_COMMUNITY)
Admission: RE | Disposition: A | Discharge status: Home / Self Care | Payer: Self-pay | Source: Home / Self Care | Attending: Obstetrics and Gynecology

## 2023-10-06 DIAGNOSIS — R1110 Vomiting, unspecified: Secondary | ICD-10-CM | POA: Diagnosis not present

## 2023-10-06 DIAGNOSIS — N950 Postmenopausal bleeding: Secondary | ICD-10-CM | POA: Insufficient documentation

## 2023-10-06 DIAGNOSIS — Z5309 Procedure and treatment not carried out because of other contraindication: Secondary | ICD-10-CM | POA: Diagnosis not present

## 2023-10-06 DIAGNOSIS — N882 Stricture and stenosis of cervix uteri: Secondary | ICD-10-CM | POA: Diagnosis present

## 2023-10-06 DIAGNOSIS — R9389 Abnormal findings on diagnostic imaging of other specified body structures: Secondary | ICD-10-CM | POA: Diagnosis present

## 2023-10-06 DIAGNOSIS — R197 Diarrhea, unspecified: Secondary | ICD-10-CM | POA: Diagnosis not present

## 2023-10-06 HISTORY — DX: Other complications of anesthesia, initial encounter: T88.59XA

## 2023-10-06 LAB — GLUCOSE, CAPILLARY: Glucose-Capillary: 191 mg/dL — ABNORMAL HIGH (ref 70–99)

## 2023-10-06 SURGERY — DILATION AND CURETTAGE
Anesthesia: Monitor Anesthesia Care

## 2023-10-06 MED ORDER — ORAL CARE MOUTH RINSE: 15.0000 mL | Freq: Once | OROMUCOSAL | Status: AC

## 2023-10-06 MED ORDER — CHLORHEXIDINE GLUCONATE 0.12 % MT SOLN: 15.0000 mL | Freq: Once | OROMUCOSAL | Status: AC

## 2023-10-06 MED ORDER — CHLORHEXIDINE GLUCONATE 0.12 % MT SOLN
OROMUCOSAL | Status: AC
  Administered 2023-10-06: 15 mL via OROMUCOSAL
  Filled 2023-10-06: qty 15

## 2023-10-06 MED ORDER — ACETAMINOPHEN 500 MG PO TABS
ORAL_TABLET | ORAL | Status: AC
  Filled 2023-10-06: qty 2

## 2023-10-06 MED ORDER — INSULIN ASPART 100 UNIT/ML IJ SOLN: 0.0000 [IU] | INTRAMUSCULAR | Status: DC | PRN

## 2023-10-06 MED ORDER — INSULIN ASPART 100 UNIT/ML IJ SOLN
INTRAMUSCULAR | Status: AC
  Administered 2023-10-06: 4 [IU] via SUBCUTANEOUS
  Filled 2023-10-06: qty 1

## 2023-10-06 MED ORDER — ACETAMINOPHEN 500 MG PO TABS: 1000.0000 mg | ORAL_TABLET | ORAL | Status: DC

## 2023-10-06 MED ORDER — LACTATED RINGERS IV SOLN: INTRAVENOUS | Status: DC

## 2023-10-06 NOTE — Progress Notes (Signed)
 Per pt the last dose of Ozempic was Sunday, June 22. Pt states that she had a couple episodes of diarrhea and vomiting this morning. HR 120-125, temp 100.4. Other VS WDL. EKG is done per dr. FABIENE Needle.  Per dr. Needle the procedure is being cancelled for the above reasons.

## 2023-10-07 ENCOUNTER — Encounter: Payer: Self-pay | Admitting: Cardiology

## 2023-10-07 ENCOUNTER — Ambulatory Visit: Payer: 59 | Attending: Cardiology | Admitting: Cardiology

## 2023-10-07 VITALS — BP 100/62 | HR 81 | Ht 62.0 in | Wt 188.0 lb

## 2023-10-07 DIAGNOSIS — Z01810 Encounter for preprocedural cardiovascular examination: Secondary | ICD-10-CM

## 2023-10-07 DIAGNOSIS — T466X5D Adverse effect of antihyperlipidemic and antiarteriosclerotic drugs, subsequent encounter: Secondary | ICD-10-CM

## 2023-10-07 DIAGNOSIS — E119 Type 2 diabetes mellitus without complications: Secondary | ICD-10-CM

## 2023-10-07 DIAGNOSIS — E785 Hyperlipidemia, unspecified: Secondary | ICD-10-CM | POA: Diagnosis not present

## 2023-10-07 DIAGNOSIS — G720 Drug-induced myopathy: Secondary | ICD-10-CM

## 2023-10-07 DIAGNOSIS — T466X5A Adverse effect of antihyperlipidemic and antiarteriosclerotic drugs, initial encounter: Secondary | ICD-10-CM

## 2023-10-07 DIAGNOSIS — I10 Essential (primary) hypertension: Secondary | ICD-10-CM | POA: Diagnosis not present

## 2023-10-07 DIAGNOSIS — I25118 Atherosclerotic heart disease of native coronary artery with other forms of angina pectoris: Secondary | ICD-10-CM

## 2023-10-07 MED ORDER — ISOSORBIDE MONONITRATE ER 30 MG PO TB24: 90.0000 mg | ORAL_TABLET | Freq: Every day | ORAL | 3 refills | Status: DC

## 2023-10-07 NOTE — Patient Instructions (Addendum)
 Medication Instructions:   Increase to  Isosorbide  mono to 90 mg  ( 3 tablets daily)   *If you need a refill on your cardiac medications before your next appointment, please call your pharmacy*   Lab Work: Not needed  Testing/Procedures:  Not needed  Follow-Up: At [facility], you and your health needs are our priority.  As part of our continuing mission to provide you with exceptional heart care, we have created designated Provider Care Teams.  These Care Teams include your primary Cardiologist (physician) and Advanced Practice Providers (APPs -  Physician Assistants and Nurse Practitioners) who all work together to provide you with the care you need, when you need it.     Your next appointment:   2 month(s)  The format for your next appointment:   In Person  Provider:   Damien Braver, NP

## 2023-10-07 NOTE — Progress Notes (Signed)
 Cardiology Office Note:  .   Date:  10/10/2023  ID:  Grace [name], DOB 02-12-1956, MRN [id number] PCP: Delores Rojelio Caldron, NP  [facility] Providers Cardiologist:  Alm Clay, MD     Chief Complaint  Patient presents with   Follow-up    - Upcoming D&C procedure   Coronary Artery Disease    Occasionally has tests, but not routinely   Referring Provider Delores Rojelio Caldron, NP.  Patient Profile: .     Grace [name] is a obese 68 y.o. female former long-term smoker with a PMH notable for CAD, HTN, HLD, DM-2, history of CVA, OSA and COPD who presents here for ~500 o'clock follow-up at the request of   I last saw Ms. Chou in November 2021 for follow-up evaluation of coronary calcification on CT scan with no Impella history of CAD and risk factors.  However her biggest issue was wanting preoperative relation for bronchoscopy.  With multiple ongoing issues, I had suggested doing a coronary CTA to exclude ischemic CAD at that time, but she declined citing that she had lots of other things to do.  She was not sure if they are doing a bronchoscopy.  Similarly she was supposed about a Coronary CTA done that was canceled due to scheduling issues around COVID.  She noted that she was not using her CPAP because of claustrophobia symptoms.  Vascepa and tried nasal prongs.      [name] was last seen on April 17, 2023 by Damien Braver, NP.  Noted that without any symptoms despite having abnormal Coronary CTA, plan would be continue medical therapy and hold off on cardiac catheterization.  During this follow-up she was noted to have had an episode of anemia with hemoglobin at eight 6.4 in November 2024 requiring 2 units of PRBC.  During the follow-up she was doing well stable from our standpoint.  No angina.  Chronic exertional dyspnea likely related to COPD. => She was on aspirin , bisoprolol , losartan , hydralazine , Imdur , Zetia  and Repatha .  She was scheduled for D&C on 10/06/2023 -  delayed b/c used Ozempic.  =>Planned for 7/7.  Subjective  Discussed the use of AI scribe software for clinical note transcription with the patient, who gave verbal consent to proceed.  History of Present Illness History of Present Illness Grace [name] is a 68 year old female with coronary artery disease and emphysema who presents with chest discomfort and shortness of breath.  She experiences chest discomfort described as a 'little pressure' occurring a couple of times a week, particularly during activities such as heavy housework.  However, she really says that this does not happen routinely.  May potentially be because she is very inactive.  Her back pain limits her activity level. She takes Imdur , two tablets at night, which has significantly improved her nighttime symptoms, although she occasionally takes an extra dose during the day when needed.  She experiences episodes of heart palpitations, described as her heart 'speeds up and then it slows back down,' but denies any prolonged episodes or significant tachycardia.  Episodes do not last more than a few seconds to maybe a minute.  She also reports occasional dizziness, particularly upon standing, but not with palpitations.  She has a history of emphysema and reports shortness of breath, especially when lying down at night, sometimes waking up due to difficulty breathing. No significant swelling.  She recently had a CT scan of her lungs but has not yet received the results.  She is currently on Ozempic for diabetes and potential cardiovascular benefits, having been on it for four weeks at a dose of 0.5 mg. She also mentions a history of significant bleeding, requiring a transfusion, but no source was identified. Her hemoglobin has since stabilized at 14.5.  She is scheduled for a D&C on July 7th due to issues with bleeding and an ultrasound showing a thickened cervical lining and a fibroid tumor. She was unable to undergo the procedure  previously due to recent Ozempic use.   Cardiovascular ROS: positive for - chest pain, dyspnea on exertion, orthopnea, palpitations, and shortness of breath negative for - edema, paroxysmal nocturnal dyspnea, rapid heart rate, or syncope or near syncope, TIA or amaurosis fugax.  Claudication.  No obvious melena, hematochezia, epistaxis.  ROS:  Review of Systems - multiple positive review of symptoms mostly noncardiac in nature related to her arthritis pains and COPD.  Uterine bleeding.    Objective   Current Meds  Medication Sig   albuterol  (PROAIR  HFA) 108 (90 Base) MCG/ACT inhaler INHALE 2 PUFFS EVERY 6  HOURS AS NEEDED FOR  SHORTNESS OF BREATH OR  WHEEZING   Aspirin -Acetaminophen -Caffeine (GOODY HEADACHE PO) Take 1 Package by mouth daily as needed (headache).   bisoprolol  (ZEBETA ) 10 MG tablet Take 10 mg by mouth daily.   Budeson-Glycopyrrol-Formoterol  (BREZTRI  AEROSPHERE) 160-9-4.8 MCG/ACT AERO Inhale 2 puffs into the lungs in the morning and at bedtime.   calcium  carbonate (TUMS - DOSED IN MG ELEMENTAL CALCIUM ) 500 MG chewable tablet Chew 1 tablet by mouth daily as needed for indigestion or heartburn.   Collagen-Vitamin C-Biotin (COLLAGEN PO) Take 1 tablet by mouth daily. peptide   diphenhydrAMINE  (BENADRYL ) 25 MG tablet Take 50 mg by mouth at bedtime as needed for sleep.   docusate sodium  (COLACE) 100 MG capsule Take 300 mg by mouth daily.   estradiol  (ESTRACE  VAGINAL) 0.1 MG/GM vaginal cream Place 1 Applicatorful vaginally at bedtime.   ezetimibe  (ZETIA ) 10 MG tablet Take 10 mg by mouth daily.   fluticasone  (FLONASE ) 50 MCG/ACT nasal spray USE 2 SPRAYS IN BOTH NOSTRILS  DAILY   furosemide  (LASIX ) 40 MG tablet TAKE 1 TABLET BY MOUTH DAILY AS  NEEDED FOR WEIGHT GAIN OF 3 LB  OVER AT NIGHT OR 5 LB IN 1 WEEK   glucose blood (ONETOUCH VERIO) test strip Use to check blood sugar twice daily. Dx: E11.9   hydrALAZINE  (APRESOLINE ) 50 MG tablet Take 1 tablet (50 mg total) by mouth 3 (three)  times daily. May take an additional 50 mg if needed for blood pressureif still elevated is greater than or equal to 160 systolic after 1 hour recheck of blood pressure   ibuprofen (ADVIL) 200 MG tablet Take 800 mg by mouth every 6 (six) hours as needed for moderate pain (pain score 4-6) or mild pain (pain score 1-3).   ipratropium-albuterol  (DUONEB) 0.5-2.5 (3) MG/3ML SOLN Take 3 mLs by nebulization every 6 (six) hours as needed.   Lancets (ONETOUCH DELICA PLUS LANCET33G) MISC USE TO CHECK BLOOD SUGAR  TWICE DAILY   levothyroxine  (SYNTHROID ) 200 MCG tablet Take 200 mcg by mouth at bedtime.   losartan  (COZAAR ) 100 MG tablet TAKE 1 TABLET BY MOUTH  DAILY   Multiple Vitamins-Minerals (HAIR SKIN & NAILS) TABS Take 1 tablet by mouth daily.   nitroGLYCERIN  (NITROSTAT ) 0.4 MG SL tablet Place 0.4 mg under the tongue every 5 (five) minutes as needed for chest pain.   omeprazole  (PRILOSEC) 40 MG capsule Take 40 mg by  mouth daily.   OZEMPIC, 0.25 OR 0.5 MG/DOSE, 2 MG/3ML SOPN Inject 0.5 mg into the skin once a week.   pregabalin  (LYRICA ) 50 MG capsule Take 50 mg by mouth daily.   REPATHA  SURECLICK 140 MG/ML SOAJ INJECT 1 PEN SUBCUTANEOUSLY  EVERY 2 WEEKS   Respiratory Therapy Supplies (NEBULIZER/TUBING/MOUTHPIECE) KIT 1 application by Does not apply route every 6 (six) hours as needed. FAX TO [phone]   traMADol  (ULTRAM ) 50 MG tablet Take 50 mg by mouth daily as needed for moderate pain (pain score 4-6) or severe pain (pain score 7-10).   valACYclovir  (VALTREX ) 500 MG tablet TAKE 1 TABLET BY MOUTH  DAILY   Vibegron  75 MG TABS Take 1 tablet (75 mg total) by mouth daily.   [Refilled today.] isosorbide  mononitrate (IMDUR ) 30 MG 24 hr tablet TAKE 2 TABLETS BY MOUTH DAILY    Studies Reviewed: .        Echocardiogram ([facility]): EF 60 to 65%.  Normal LV size and function.  Mild LVH.  GR 1 DD.  Normal RV size.  Normal RV function.  No significant valvular disease.  (07/30/2022) Coronary CTA: CAC  2262.  Total Plaque Volume 1356-extensive plaque.  Combination of calcified and noncalcified.  Severe CAD of mid RCA (FFR abnormal), mild proximal LAD and CX.;  noted emphysema and aortic atherosclerosis.  (08/29/2022) Abnormal results reviewed with the patient, but she is not having any new symptoms or concerns.  Plan was to treat medically unless symptoms arise.  LABS:  Hb: 14.5; BMP: Creatinine 0.84, Potassium 3.9 (08/20/2023)  Risk Assessment/Calculations:          Physical Exam:   VS:  BP 100/62   Pulse 81   Ht 5' 2 (1.575 m)   Wt 188 lb (85.3 kg)   LMP 02/27/2014   SpO2 95%   BMI 34.39 kg/m    Wt Readings from Last 3 Encounters:  10/07/23 188 lb (85.3 kg)  10/05/23 194 lb 0.1 oz (88 kg)  09/18/23 194 lb (88 kg)    GEN: Well nourished, well groomed in no acute distress; Moderately Obese.  Has a mild chronically ill appearance t NECK: No JVD; No carotid bruits CARDIAC: Normal S1, S2; RRR, no murmurs, rubs, gallops RESPIRATORY:  Clear to auscultation without rales, but there are some mild expiratory wheezing.  No rhonchi rhonchi ; nonlabored, good air movement. ABDOMEN: Soft, non-tender, non-distended EXTREMITIES: Trivial edema; No deformity      ASSESSMENT AND PLAN: .    Problem List Items Addressed This Visit       Cardiology Problems   Coronary artery disease of native artery of native heart with stable angina pectoris (HCC) - Primary (Chronic)   She is intermittently having chest pain episodes that occurs at night more often than not but not all the time.  She also can have some exertional symptoms.  These symptoms have improved since starting Imdur .  She is due for a D&C procedure which is a relatively low risk procedure regarding standpoint.  It would also be pertinent for this to be performed prior to her having any type of invasive cardiac procedure.  We talked about the next step after Coronary CTA with need to consider cardiac catheterization if her symptoms  worsen.  Been doing so also discussed the risks benefits alternatives and indications of cardiac catheterization including the risk of stent placement.  The main indication for this would be to actually treat symptoms and that guidelines with direct proceeding with  noncardiac surgical procedures in the absence of ongoing anginal or heart failure symptoms.  Will plan to revisit her symptoms once she has had a procedure done. - Increase Imdur  to 3 tabs (90 mg) daily and provide as needed NTG -Continue aspirin  81 mg daily -In addition to Imdur , continue bisoprolol  10 mg daily for antianginal benefit along with losartan  100 mg daily plus hydralazine  50 mg 3 times daily for blood pressure control/afterload reduction -Continue Repatha  and Zetia   -She is also on Ozempic      Relevant Medications   isosorbide  mononitrate (IMDUR ) 30 MG 24 hr tablet   Other Relevant Orders   EKG 12-Lead   Essential hypertension (Chronic)   Addendum your blood pressure is little low today.  We can consider potentially backing down on meds if pressures continue to be low.  I would hold losartan  on the day of surgery continue beta-blocker and hydralazine . Otherwise continue bisoprolol  10 mg daily, losartan  100 mg daily.  Depending on what her blood pressures look like I am we could potentially consider weaning down to hydralazine  Continue Imdur  and increase as 90 mg      Relevant Medications   isosorbide  mononitrate (IMDUR ) 30 MG 24 hr tablet   Other Relevant Orders   EKG 12-Lead   Hyperlipidemia LDL goal <70 (Chronic)   No longer atorvastatin .  Apparently has had statin intolerance and is now on Repatha  plus Zetia  She is due for follow-up lipid panel.  -Continue Zetia  10 mg daily and Repatha  injections - Will order lipid panel to be added with her preop labs for upcoming D&C.      Relevant Medications   isosorbide  mononitrate (IMDUR ) 30 MG 24 hr tablet   Other Relevant Orders   EKG 12-Lead     Other    Preoperative cardiovascular examination   Scheduled for D&C on July 7 due to bleeding and thickened cervical lining. Previous procedure postponed due to medication interaction with anesthesia.  Unfortunately Omaria is very difficult to assess for preop standpoint.  She has multiple deformities and is very sedentary so is difficult to assess for symptoms.  Her planned procedure is a uterine D&C which is a low to intermediate risk noncardiac surgery. She does not actively have heart failure or persistent anginal symptoms, has never had a stroke, has diabetes but not on insulin , and abnormal renal function.  Given her need for D&C procedure to be performed recommendation will be to proceed with this procedure.  To avoid any delay, after which we can then potentially be more safe in using more aggressive antiplatelet agents.  The risk of invasive coronary ischemic evaluation at this point with the potential of stent placement would not only potentially delay her upcoming surgery, but could potentially lead to more complications with the eventual need for Thienopyridine cessation to allow further surgery (if post PCI).  As such, based on our discussion today, I think is best for her to proceed with D&C as scheduled, holding aspirin  5 to 7 days preop.  We can then reassess her after surgery.      Statin myopathy (Chronic)   Having started on multiple different statins, and unfortunate unable to tolerate.  She was most recently on high-dose atorvastatin  that was subsequently stopped as well.  She is now on Repatha  Zetia .      Type 2 diabetes mellitus without complication, without long-term current use of insulin  (HCC) (Chronic)   Defer to PCP.       Although the patient is  listed as my patient, I have not seen her in several years.  Medication changes and several additional tests have been performed did have to have to review for this visit.        Follow-Up: Return in about 2 months (around  12/07/2023) for [facility], Follow-up with APP.    I spent 4 minutes in the care of SHONICA WEIER today including reviewing labs (2 minutes), reviewing studies (Coronary CTA result-2 minutes), reviewing outside studies (outside echo reviewed-2 minutes), face to face time discussing treatment options (26 minutes), reviewing records from my previous note, notes from APP, and gynecology notes (9 minutes), for 13 minutes, and documenting in the encounter.     Signed, Alm MICAEL Clay, MD, MS Alm Clay, M.D., M.S. Interventional Chartered certified accountant  Pager # [phone]

## 2023-10-08 ENCOUNTER — Encounter: Admitting: Physical Therapy

## 2023-10-08 NOTE — Interval H&P Note (Signed)
 History and Physical Interval Note:  10/08/2023 4:02 PM  Grace [name]  has presented today for surgery, with the diagnosis of Cervical stenosis  thickened endometrium.  The various methods of treatment have been discussed with the patient and family. After consideration of risks, benefits and other options for treatment, the patient has consented to  Procedure(s) with comments: DILATION AND CURETTAGE (N/A) - ANESTHESIA REQUEST TO BE UNDER IV CONSCIOUS SEDATION LIKE FOR A COLONOSCOPY IF POSSIBLE as a surgical intervention.  The patient's history has been reviewed, patient examined, no change in status, stable for surgery.  I have reviewed the patient's chart and labs.  Questions were answered to the patient's satisfaction.    Patient presented for procedure. Due to having nausea and vomiting, recent ozempic usage and hx of respiratory failure with anesthesia, decision made to cancel procedure.    Sherel Fennell

## 2023-10-10 ENCOUNTER — Encounter: Payer: Self-pay | Admitting: Cardiology

## 2023-10-10 DIAGNOSIS — I25118 Atherosclerotic heart disease of native coronary artery with other forms of angina pectoris: Secondary | ICD-10-CM | POA: Insufficient documentation

## 2023-10-10 DIAGNOSIS — Z01810 Encounter for preprocedural cardiovascular examination: Secondary | ICD-10-CM | POA: Insufficient documentation

## 2023-10-10 NOTE — Assessment & Plan Note (Signed)
 Defer to PCP

## 2023-10-10 NOTE — Assessment & Plan Note (Signed)
 No longer atorvastatin .  Apparently has had statin intolerance and is now on Repatha  plus Zetia  She is due for follow-up lipid panel.  -Continue Zetia  10 mg daily and Repatha  injections - Will order lipid panel to be added with her preop labs for upcoming D&C.

## 2023-10-10 NOTE — Assessment & Plan Note (Signed)
 Having started on multiple different statins, and unfortunate unable to tolerate.  She was most recently on high-dose atorvastatin  that was subsequently stopped as well.  She is now on Repatha  Zetia .

## 2023-10-10 NOTE — Assessment & Plan Note (Addendum)
 She is intermittently having chest pain episodes that occurs at night more often than not but not all the time.  She also can have some exertional symptoms.  These symptoms have improved since starting Imdur .  She is due for a D&C procedure which is a relatively low risk procedure regarding standpoint.  It would also be pertinent for this to be performed prior to her having any type of invasive cardiac procedure.  We talked about the next step after Coronary CTA with need to consider cardiac catheterization if her symptoms worsen.  Been doing so also discussed the risks benefits alternatives and indications of cardiac catheterization including the risk of stent placement.  The main indication for this would be to actually treat symptoms and that guidelines with direct proceeding with noncardiac surgical procedures in the absence of ongoing anginal or heart failure symptoms.  Will plan to revisit her symptoms once she has had a procedure done. - Increase Imdur  to 3 tabs (90 mg) daily and provide as needed NTG -Continue aspirin  81 mg daily -In addition to Imdur , continue bisoprolol  10 mg daily for antianginal benefit along with losartan  100 mg daily plus hydralazine  50 mg 3 times daily for blood pressure control/afterload reduction -Continue Repatha  and Zetia   -She is also on Ozempic

## 2023-10-10 NOTE — Assessment & Plan Note (Signed)
 Addendum your blood pressure is little low today.  We can consider potentially backing down on meds if pressures continue to be low.  I would hold losartan  on the day of surgery continue beta-blocker and hydralazine . Otherwise continue bisoprolol  10 mg daily, losartan  100 mg daily.  Depending on what her blood pressures look like I am we could potentially consider weaning down to hydralazine  Continue Imdur  and increase as 90 mg

## 2023-10-10 NOTE — Assessment & Plan Note (Signed)
 Scheduled for D&C on July 7 due to bleeding and thickened cervical lining. Previous procedure postponed due to medication interaction with anesthesia.  Unfortunately Grace [name] is very difficult to assess for preop standpoint.  She has multiple deformities and is very sedentary so is difficult to assess for symptoms.  Her planned procedure is a uterine D&C which is a low to intermediate risk noncardiac surgery. She does not actively have heart failure or persistent anginal symptoms, has never had a stroke, has diabetes but not on insulin , and abnormal renal function.  Given her need for D&C procedure to be performed recommendation will be to proceed with this procedure.  To avoid any delay, after which we can then potentially be more safe in using more aggressive antiplatelet agents.  The risk of invasive coronary ischemic evaluation at this point with the potential of stent placement would not only potentially delay her upcoming surgery, but could potentially lead to more complications with the eventual need for Thienopyridine cessation to allow further surgery (if post PCI).  As such, based on our discussion today, I think is best for her to proceed with D&C as scheduled, holding aspirin  5 to 7 days preop.  We can then reassess her after surgery.

## 2023-10-13 ENCOUNTER — Encounter (HOSPITAL_COMMUNITY): Payer: Self-pay | Admitting: Obstetrics and Gynecology

## 2023-10-13 ENCOUNTER — Encounter: Admitting: Physical Therapy

## 2023-10-13 ENCOUNTER — Encounter: Payer: Self-pay | Admitting: Obstetrics and Gynecology

## 2023-10-13 ENCOUNTER — Other Ambulatory Visit: Payer: Self-pay

## 2023-10-13 DIAGNOSIS — N882 Stricture and stenosis of cervix uteri: Secondary | ICD-10-CM

## 2023-10-13 NOTE — Progress Notes (Signed)
 SDW call  Patient was given pre-op instructions over the phone. Patient verbalized understanding of instructions provided.     PCP - Rojelio Daring, NP Cardiologist - Dr. Alm Clay, LOV 10/07/2023 Pulmonary:    PPM/ICD - denies Device Orders - na Rep Notified - na   Chest x-ray - 01/09/2023 EKG -  10/07/2023 Stress Test - 03/22/2004 ECHO - 07/30/2022 Cardiac Cath -  PFT: 03/30/2020  Sleep Study/sleep apnea/CPAP: OSA, does not wear CPAP  Type II diabetic.  Does not check her blood sugar.  Last documented A1C 6.9 02/28/2023. States she had it drawn at [facility] in May and it was 6.5 Fasting Blood sugar range: unknown How often check sugars: unknown  GLP1: Ozempic, states last dose 10/04/2023   Blood Thinner Instructions: denies Aspirin  Instructions: states last dose was today 10/13/2023.  Hold 5-7 days per Cardiology   ERAS Protcol - Clears until 0730   Anesthesia review: YesSABRA Sor reviewed 10/05/2023, however, new EKG and Cardiology visit since.  HTN, stroke, DM, OSA, HLD   Patient denies shortness of breath, fever, cough and chest pain over the phone call  Your procedure is scheduled on Monday October 19, 2023  Report to [facility] Main Entrance A at 0800 A.M., then check in with the Admitting office.  Call this number if you have problems the morning of surgery:  [phone]   If you have any questions prior to your surgery date call [phone]: Open Monday-Friday 8am-4pm If you experience any cold or flu symptoms such as cough, fever, chills, shortness of breath, etc. between now and your scheduled surgery, please notify us  at the above number     Remember:  Do not eat after midnight the night before your surgery  You may drink clear liquids until  0730  the morning of your surgery.   Clear liquids allowed are: Water, Non-Citrus Juices (without pulp), Carbonated Beverages, Clear Tea, Black Coffee ONLY (NO MILK, CREAM OR POWDERED CREAMER of any kind), and  Gatorade   Take these medicines the morning of surgery with A SIP OF WATER:  Bisoprolol , breztri , zetia , prilosec, lyrica , valtrex , vibegron , flonase , hydralazine , isosorbide , levothyroxine   As needed: Albuterol , duoneb, tramadol   As of today, STOP taking any Aspirin  (unless otherwise instructed by your surgeon) Aleve, Naproxen, Ibuprofen, Motrin, Advil, Goody's, BC's, all herbal medications, fish oil, and all vitamins.

## 2023-10-14 ENCOUNTER — Other Ambulatory Visit: Payer: Self-pay | Admitting: Acute Care

## 2023-10-14 DIAGNOSIS — Z122 Encounter for screening for malignant neoplasm of respiratory organs: Secondary | ICD-10-CM

## 2023-10-14 DIAGNOSIS — Z87891 Personal history of nicotine dependence: Secondary | ICD-10-CM

## 2023-10-14 NOTE — Progress Notes (Signed)
 Anesthesia Chart Review: SAME DAY WORK-UP  Case: [id number] Date/Time: 10/19/23 1019   Procedure: DILATION AND CURETTAGE - ANESTHESIA REQUEST TO BE UNDER IV CONSCIOUS SEDATION LIKE FOR A COLONOSCOPY IF POSSIBLE   Anesthesia type: Monitor Anesthesia Care   Diagnosis:      Cervical stenosis (uterine cervix) [N88.2]     Thickened endometrium [R93.89]     Post-menopausal bleeding [N95.0]   Pre-op diagnosis:      Cervical stenosis      thickened endometrium   [facility] 09 / [facility]   Surgeons: Jeralyn Crutch, MD       DISCUSSION: Patient is a 68 year old female scheduled for the above procedure.  Case was originally scheduled for 10/06/2023, but when she arrived she reported a couple of episodes of diarrhea and vomiting that morning. Her last Ozempic was only 2 days prior. Her HR was elevated at ~ 120-125 bpm with low grade temperature of 100.4. Decision made to postpone surgery.  See anesthesia APP note by Odis Sor, PA-C. Per her 10/05/23 note:  Grace [name] is a 68 yo female who is a SDW prior to surgery above PMH of former smoking, HTN, CAD (by CT), asthma, COPD (stage 3, severe), LLL lung mass (s/p video bronchoscopy/EBUS 03/15/20: no malignant cells), OSA (CPAP intolerance), migraine, hx of CVA (2002), GERD, hiatal hernia, PUD, Barrett's esopagus, hypothyroid, T2DM, chronic pain.   Prior anesthesia complications include hx of failed conscious sedation in 2013.Pt also reports hx of respiratory failure after general anesthesia for lap appy in 2017 requiring bipap and treatment of COPD exacerbation.   Patient follows with Cardiology for hx of severe CAD by CTA. Coronary CT angiogram in 08/2022 revealed coronary calcium  score 2262 (99th percentile), extensive TPV, severe CAD of the mid RCA with abnormal FFR, mild proximal LAD and left circumflex stenoses, possible significant disease of the mid LAD to distal LAD (not mottled by FFR).  Findings were discussed with Dr. Anner in the  absence of symptoms, initial trial of medical therapy was advised. She has stable chronic dyspnea in the setting of COPD, otherwise stable with no anginal symptoms.  Should she develop progressive symptoms despite medical therapy, consider need for further ischemic evaluation (possible catheterization). Continue aspirin , bisoprolol , losartan , hydralazine , Imdur , Zetia , and Repatha .   Patient reports stable SOB during PAT phone interview. No chest pain with exertion. Patient is able to perform ADLs and ambulate short distances. She is unable to do a flight of stairs.   Patient previously followed with pulmonology due to severe COPD.  Last seen in 02/2023.  She is on inhalers.  She was advised to do PFTs but this has not been completed yet.  She was advised to restart her CPAP for OSA however patient is having difficulty tolerating the mask.   LD Ozempic: 6/22. Pt was not instructed to hold.    Discussed with Dr. Maryclare who advised to let patient know she will likely be switched to general anesthesia for airway protection. Discussed with patient on the phone. She says she is ok with general but is just anxious about it since she had the complication in 2017. She confirms no recently COPD exacerbation or URI.   Sine then she had follow-up with cardiologist Dr. Anner on 10/07/23. Chest symptoms improved on Imdur  but still intermittently present. He noted D&C recently delayed. He increased her Imdur . Preoperative risk assessment addressed:  Preoperative cardiovascular examination  Scheduled for D&C on July 7 due to bleeding and thickened cervical lining.  Previous procedure postponed due to medication interaction with anesthesia.   Unfortunately Grace [name] is very difficult to assess for preop standpoint.  She has multiple deformities and is very sedentary so is difficult to assess for symptoms.   Her planned procedure is a uterine D&C which is a low to intermediate risk noncardiac surgery. She does not  actively have heart failure or persistent anginal symptoms, has never had a stroke, has diabetes but not on insulin , and abnormal renal function.   Given her need for D&C procedure to be performed recommendation will be to proceed with this procedure.  To avoid any delay, after which we can then potentially be more safe in using more aggressive antiplatelet agents.  The risk of invasive coronary ischemic evaluation at this point with the potential of stent placement would not only potentially delay her upcoming surgery, but could potentially lead to more complications with the eventual need for Thienopyridine cessation to allow further surgery (if post PCI).   As such, based on our discussion today, I think is best for her to proceed with D&C as scheduled, holding aspirin  5 to 7 days preop.  We can then reassess her after surgery.   Last Ozempic 10/04/23.  Anesthesia team to evaluate on the day of surgery. She reported instructions to hold ASA 5-7 days prior to surgery, last dose 10/13/23.   VS: Ht 5' 2 (1.575 m)   Wt 84.8 kg   LMP 02/27/2014   BMI 34.20 kg/m  BP Readings from Last 3 Encounters:  10/07/23 100/62  10/06/23 120/79  09/18/23 103/71   Pulse Readings from Last 3 Encounters:  10/07/23 81  10/06/23 (!) 120  09/18/23 96     PROVIDERS: Delores Rojelio Caldron, NP is PCP  Anner Lenis, MD is cardiologist   LABS: For day of surgery as indicated. Last results in [facility] include: Lab Results  Component Value Date   WBC 5.5 08/20/2023   HGB 14.5 08/20/2023   HCT 46.4 (H) 08/20/2023   PLT 206 08/20/2023   GLUCOSE 104 (H) 08/20/2023   CHOL 99 (L) 01/13/2023   TRIG 131 01/13/2023   HDL 45 01/13/2023   LDLDIRECT 104 (H) 06/12/2010   LDLCALC 31 01/13/2023   ALT 13 02/28/2023   AST 19 02/28/2023   NA 136 08/20/2023   K 3.9 08/20/2023   CL 101 08/20/2023   CREATININE 0.84 08/20/2023   BUN 16 08/20/2023   CO2 26 08/20/2023   HGBA1C 6.9 (H) 02/28/2023  She stated last A1c at  [facility] in May was 6.5.   IMAGES: CT Chest LCS 09/30/23: IMPRESSION: 1. Lung-RADS 1, negative. Continue annual screening with low-dose chest CT without contrast in 12 months. 2. Aortic Atherosclerosis (ICD10-I70.0) and Emphysema (ICD10-J43.9). Coronary artery atherosclerosis.   CT Head 08/20/23: IMPRESSION: No evidence of acute intracranial abnormality.  US  Pelvis 08/19/23: IMPRESSION: 1. No acute findings. 2. Multiple uterine fibroids as detailed. One fibroid, smallest, has a submucosal component could be the source of postmenopausal bleeding. 3. Endometrium thickened for a postmenopausal female, 9 mm. In the setting of post-menopausal bleeding, endometrial sampling is indicated to exclude carcinoma. If results are benign, sonohysterogram should be considered for focal lesion work-up. (Ref: Radiological Reasoning: Algorithmic Workup of Abnormal Vaginal Bleeding with Endovaginal Sonography and Sonohysterography. AJR 2008; 808:D31-26) 4. Dominant left ovarian cyst measuring 3.4 cm. Recommend followup US  in 3-6 months. Note: This recommendation does not apply to premenarchal patients or to those with increased risk (genetic, family history, elevated tumor markers  or other high-risk factors) of ovarian cancer. Reference: Radiology 2019 Nov; 293(2):359-371.    EKG: EKG 10/06/23: Sinus tachycardia at 122 bpm Otherwise normal ECG When compared with ECG of 28-Feb-2023 10:25, PREVIOUS ECG IS PRESENT Since last tracing rate faster Confirmed by Court Carrier [phone]) on 10/06/2023 1:02:03 PM   CV: Coronary CTA 08/29/2022: IMPRESSION: 1. Coronary calcium  score of 2262. This was 60 percentile for age and sex matched control.   2. Total plaque volume (TPV) 1356 mm3 which is 97 percentile for age-and sex matched controls (calcified plaque 526 mm3; non-calcified plaque 830 mm3). TPV is extensive.   3. Normal coronary origin with right dominance.   4. Severe CAD of mid RCA  (FFR abnormal), mild proximal LAD and CX. Possible significant disease of mid LAD to distal (not modeled by FFR).   IMPRESSION: 1.  Abnormal RCA FFR 0.65 in mid region.    Echo 07/30/2022: Conclusions: Normal LV systolic function with EF 60 to 65% Left ventricular cavity is normal in size Normal global wall motion Mild LVH Grade 1 diastolic dysfunction Right ventricle cavity is normal in size Normal right ventricular function No significant valve disease   Zio XT monitor 05/30/2022 - 06/13/2022: Patient had a minimum heart rate of 52 bpm, maximum heart rate of 167 bpm, and average heart rate of 80 bpm.  Predominant underlying rhythm was sinus rhythm.  1 run of ventricular tachycardia occurred lasting 6 beats with a max rate of 167 bpm (average 143 bpm).  280 supraventricular tachycardia runs occurred, the run with the fastest interval lasting 5 beats with a maximum rate of 162 bpm, the longest lasting 49.6 seconds with an average rate of 125 bpm.  Isolated SVE's, SVE couplets, and SVE triplets were rare (< 1.0%).  Isolated VE's and VE couplets were rare (< 1.0%) and no VE triplets were present.  Past Medical History:  Diagnosis Date   Abnormal vaginal bleeding    Anxiety    Asthma    Barrett's esophagus    with high grade dysplasia per endoscopy 05/2005 // followed by Dr. Lupita Commander ([facility])   Chronic back pain    Complication of anesthesia    per patient she went in respiratory failure in recovery room in 2017   COPD (chronic obstructive pulmonary disease) (HCC)    Diabetes mellitus without complication (HCC)    type 2   Duodenal ulcer disease    thought to be contributed at least partly by overuse of NSAIDs and salicylates (goody powdr)   Endometrial polyp 03/2003   s/p resection in 03/2003. 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   Migraine    Nevus    Personal history of failed conscious sedation 08/11/2011   Sleep  apnea    does not use cpap   Stroke (HCC) 10/2000   Tachycardia    Thyroid  disease    Tobacco abuse 04/2012   quit    Past Surgical History:  Procedure Laterality Date   ADENOIDECTOMY     APPENDECTOMY  2017   BALLOON DILATION N/A 06/14/2020   Procedure: BALLOON DILATION;  Surgeon: Wilhelmenia Aloha Raddle., MD;  Location: [facility] ENDOSCOPY;  Service: Gastroenterology;  Laterality: N/A;   BIOPSY  02/15/2020   Procedure: BIOPSY;  Surgeon: Burnette Fallow, MD;  Location: [facility] ENDOSCOPY;  Service: Endoscopy;;   BIOPSY  06/14/2020   Procedure: BIOPSY;  Surgeon: Wilhelmenia Aloha Raddle., MD;  Location: [facility] ENDOSCOPY;  Service: Gastroenterology;;   BIOPSY  03/02/2023   Procedure: BIOPSY;  Surgeon: Saintclair Jasper, MD;  Location: [facility] ENDOSCOPY;  Service: Gastroenterology;;   BRONCHIAL BIOPSY  03/15/2020   Procedure: BRONCHIAL BIOPSIES;  Surgeon: Brenna Adine CROME, DO;  Location: [facility] ENDOSCOPY;  Service: Pulmonary;;   BRONCHIAL BRUSHINGS  03/15/2020   Procedure: BRONCHIAL BRUSHINGS;  Surgeon: Brenna Adine CROME, DO;  Location: [facility] ENDOSCOPY;  Service: Pulmonary;;   BRONCHIAL NEEDLE ASPIRATION BIOPSY  03/15/2020   Procedure: BRONCHIAL NEEDLE ASPIRATION BIOPSIES;  Surgeon: Brenna Adine CROME, DO;  Location: [facility] ENDOSCOPY;  Service: Pulmonary;;   BRONCHIAL WASHINGS  03/15/2020   Procedure: BRONCHIAL WASHINGS;  Surgeon: Brenna Adine CROME, DO;  Location: [facility] ENDOSCOPY;  Service: Pulmonary;;   CHOLECYSTECTOMY     COLONOSCOPY  02/07/2010   COLONOSCOPY WITH PROPOFOL  N/A 02/15/2020   Procedure: COLONOSCOPY WITH PROPOFOL ;  Surgeon: Burnette Fallow, MD;  Location: [facility] ENDOSCOPY;  Service: Endoscopy;  Laterality: N/A;   DILATION AND CURETTAGE OF UTERUS     ENDOSCOPIC MUCOSAL RESECTION N/A 06/14/2020   Procedure: ENDOSCOPIC MUCOSAL RESECTION;  Surgeon: Wilhelmenia Aloha Raddle., MD;  Location[facility] ENDOSCOPY;  Service: Gastroenterology;  Laterality: N/A;   ESOPHAGOGASTRODUODENOSCOPY (EGD) WITH PROPOFOL  N/A 02/15/2020   Procedure: ESOPHAGOGASTRODUODENOSCOPY (EGD) WITH PROPOFOL ;  Surgeon: Burnette Fallow, MD;  Location: [facility] ENDOSCOPY;  Service: Endoscopy;  Laterality: N/A;   ESOPHAGOGASTRODUODENOSCOPY (EGD) WITH PROPOFOL  N/A 06/14/2020   Procedure: ESOPHAGOGASTRODUODENOSCOPY (EGD) WITH PROPOFOL ;  Surgeon: Wilhelmenia Aloha Raddle., MD;  Location: [facility] ENDOSCOPY;  Service: Gastroenterology;  Laterality: N/A;   ESOPHAGOGASTRODUODENOSCOPY (EGD) WITH PROPOFOL  Left 03/02/2023   Procedure: ESOPHAGOGASTRODUODENOSCOPY (EGD) WITH PROPOFOL ;  Surgeon: Saintclair Jasper, MD;  Location: [facility] ENDOSCOPY;  Service: Gastroenterology;  Laterality: Left;   FOREIGN BODY REMOVAL  06/14/2020   Procedure: FOREIGN BODY REMOVAL;  Surgeon: Wilhelmenia Aloha Raddle., MD;  Location: [facility] ENDOSCOPY;  Service: Gastroenterology;;   HYSTEROSCOPY     LAPAROSCOPIC APPENDECTOMY N/A 09/23/2015   Procedure: APPENDECTOMY LAPAROSCOPIC;  Surgeon: Dann Hummer, MD;  Location: [facility] OR;  Service: General;  Laterality: N/A;   MYOMECTOMY  1990, 1997   x 2 - In 1997, noted to have extensive pelvic adhesions and BL tubal obstruction   POLYPECTOMY  02/15/2020   Procedure: POLYPECTOMY;  Surgeon: Burnette Fallow, MD;  Location: [facility] ENDOSCOPY;  Service: Endoscopy;;   TONSILLECTOMY     TRANSTHORACIC ECHOCARDIOGRAM  06/2014    EF 55-60%.  No RWMA. Gr 1 DD. Cannot R/o PFO (consider bubble study).   UPPER  ESOPHAGEAL ENDOSCOPIC ULTRASOUND (EUS) N/A 06/14/2020   Procedure: UPPER ESOPHAGEAL ENDOSCOPIC ULTRASOUND (EUS);  Surgeon: Wilhelmenia Aloha Raddle., MD;  Location: [facility] ENDOSCOPY;  Service: Gastroenterology;  Laterality: N/A;   UPPER GASTROINTESTINAL ENDOSCOPY  12/18/2009   VIDEO BRONCHOSCOPY WITH ENDOBRONCHIAL NAVIGATION N/A 03/15/2020   Procedure: VIDEO BRONCHOSCOPY WITH ENDOBRONCHIAL NAVIGATION;  Surgeon: Brenna Adine  L, DO;  Location: [facility] ENDOSCOPY;  Service: Pulmonary;  Laterality: N/A;   VIDEO BRONCHOSCOPY WITH ENDOBRONCHIAL ULTRASOUND  03/15/2020   Procedure: VIDEO BRONCHOSCOPY WITH ENDOBRONCHIAL ULTRASOUND;  Surgeon: Brenna Adine CROME, DO;  Location: [facility] ENDOSCOPY;  Service: Pulmonary;;   WISDOM TOOTH EXTRACTION      MEDICATIONS: No current facility-administered medications for this encounter.    albuterol  (PROAIR  HFA) 108 (90 Base) MCG/ACT inhaler   aspirin  EC 81 MG tablet   Aspirin -Acetaminophen -Caffeine (GOODY HEADACHE PO)   bisoprolol  (ZEBETA ) 10 MG tablet   Budeson-Glycopyrrol-Formoterol  (BREZTRI  AEROSPHERE) 160-9-4.8 MCG/ACT AERO   calcium  carbonate (TUMS - DOSED IN MG ELEMENTAL CALCIUM ) 500 MG chewable tablet   Collagen-Vitamin C-Biotin (COLLAGEN PO)   diphenhydrAMINE  (BENADRYL ) 25 MG tablet   docusate sodium  (COLACE) 100 MG capsule   estradiol  (ESTRACE  VAGINAL) 0.1 MG/GM vaginal cream   ezetimibe  (ZETIA ) 10 MG tablet   fluticasone  (FLONASE ) 50 MCG/ACT nasal spray   furosemide  (LASIX ) 40 MG tablet   glucose blood (ONETOUCH VERIO) test strip   hydrALAZINE  (APRESOLINE ) 50 MG tablet   ibuprofen (ADVIL) 200 MG tablet   ipratropium-albuterol  (DUONEB) 0.5-2.5 (3) MG/3ML SOLN   isosorbide  mononitrate (IMDUR ) 30 MG 24 hr tablet   Lancets (ONETOUCH DELICA PLUS LANCET33G) MISC   levothyroxine  (SYNTHROID ) 200 MCG tablet   losartan  (COZAAR ) 100 MG tablet   misoprostol  (CYTOTEC ) 200 MCG tablet   Multiple Vitamins-Minerals (HAIR SKIN & NAILS) TABS   nitroGLYCERIN  (NITROSTAT ) 0.4 MG SL  tablet   omeprazole  (PRILOSEC) 40 MG capsule   OZEMPIC, 0.25 OR 0.5 MG/DOSE, 2 MG/3ML SOPN   pantoprazole  (PROTONIX ) 40 MG tablet   pregabalin  (LYRICA ) 50 MG capsule   REPATHA  SURECLICK 140 MG/ML SOAJ   Respiratory Therapy Supplies (NEBULIZER/TUBING/MOUTHPIECE) KIT   traMADol  (ULTRAM ) 50 MG tablet   valACYclovir  (VALTREX ) 500 MG tablet   Vibegron  75 MG TABS    Isaiah Ruder, PA-C Surgical Short Stay/Anesthesiology [facility] Phone [phone] [facility] Phone [phone] 10/14/2023 4:38 PM

## 2023-10-14 NOTE — Anesthesia Preprocedure Evaluation (Addendum)
 Anesthesia Evaluation  Patient identified by MRN, date of birth, ID band Patient awake    Reviewed: Allergy & Precautions, NPO status , Patient's Chart, lab work & pertinent test results  History of Anesthesia Complications (+) history of anesthetic complications (hx of failed conscious sedation in 2013.Pt also reports hx of respiratory failure after general anesthesia for lap appy in 2017 requiring bipap and treatment of COPD exacerbation.)  Airway Mallampati: II  TM Distance: >3 FB Neck ROM: Full   Comment: Previous grade I view with MAC 3, easy mask Dental  (+) Dental Advisory Given, Missing,    Pulmonary neg shortness of breath, asthma , sleep apnea (does not use CPAP) , COPD (used Breztri  and nebulizer this morning),  COPD inhaler, neg recent URI, former smoker   Pulmonary exam normal breath sounds clear to auscultation       Cardiovascular hypertension (bisoprolol , hydralazine , ISMN, losartan ), Pt. on home beta blockers and Pt. on medications + angina  + CAD  (-) Past MI, (-) Cardiac Stents and (-) CABG (-) dysrhythmias  Rhythm:Regular Rate:Normal  HLD, PFO   Neuro/Psych  Headaches, neg Seizures PSYCHIATRIC DISORDERS Anxiety      Neuromuscular disease (neuropathy) CVA (10/2000), No Residual Symptoms    GI/Hepatic Neg liver ROS, hiatal hernia, PUD,GERD (Barrett's esophagus)  Medicated,,  Endo/Other  diabetes, Type 2Hypothyroidism    Renal/GU negative Renal ROS     Musculoskeletal   Abdominal  (+) + obese  Peds  Hematology negative hematology ROS (+) Lab Results      Component                Value               Date                      WBC                      5.5                 08/20/2023                HGB                      14.5                08/20/2023                HCT                      46.4 (H)            08/20/2023                MCV                      97.7                08/20/2023                 PLT                      206                 08/20/2023              Anesthesia Other Findings Last Ozempic: 10/04/2023  Reproductive/Obstetrics  Anesthesia Physical Anesthesia Plan  ASA: 3  Anesthesia Plan: MAC   Post-op Pain Management: Minimal or no pain anticipated   Induction: Intravenous  PONV Risk Score and Plan: 2 and Ondansetron , Dexamethasone , Treatment may vary due to age or medical condition, Propofol  infusion, TIVA and Midazolam   Airway Management Planned: Natural Airway and Simple Face Mask  Additional Equipment:   Intra-op Plan:   Post-operative Plan:   Informed Consent: I have reviewed the patients History and Physical, chart, labs and discussed the procedure including the risks, benefits and alternatives for the proposed anesthesia with the patient or authorized representative who has indicated his/her understanding and acceptance.     Dental advisory given  Plan Discussed with: CRNA and Anesthesiologist  Anesthesia Plan Comments: (Discussed with patient risks of MAC including, but not limited to, minor pain or discomfort, hearing people in the room, and possible need for backup general anesthesia. Risks for general anesthesia also discussed including, but not limited to, sore throat, hoarse voice, chipped/damaged teeth, injury to vocal cords, nausea and vomiting, allergic reactions, lung infection, heart attack, stroke, and death. All questions answered.   See PAT note written 10/14/2023 by Allison Zelenak, PA-C. Surgery rescheduled from 10/07/23. Prior anesthesia complications include hx of failed conscious sedation in 2013.Pt also reports hx of respiratory failure after general anesthesia for lap appy in 2017 requiring bipap and treatment of COPD exacerbation. Burnard Senna, PA-C discussed with anesthesiologist and patient in June, Discussed with Dr. Maryclare who advised to let patient know she will likely be  switched to general anesthesia for airway protection. Discussed with patient on the phone. She says she is ok with general but is just anxious about it since she had the complication in 2017. She confirms no recently COPD exacerbation or URI.  Patient follows with Cardiology for hx of severe CAD by CTA. Coronary CT angiogram in 08/2022 revealed coronary calcium  score 2262 (99th percentile), extensive TPV, severe CAD of the mid RCA with abnormal FFR, mild proximal LAD and left circumflex stenoses, possible significant disease of the mid LAD to distal LAD (not mottled by FFR).  Findings were discussed with Dr. Anner in the absence of symptoms, initial trial of medical therapy was advised. She has stable chronic dyspnea in the setting of COPD, otherwise stable with no anginal symptoms.  Should she develop progressive symptoms despite medical therapy, consider need for further ischemic evaluation (possible catheterization)....  She had follow-up with cardiologist Dr. Anner on 10/07/23. Chest symptoms improved on Imdur  but still intermittently present. He noted D&C recently delayed. He increased her Imdur . Preoperative risk assessment addressed:  Preoperative cardiovascular examination  Scheduled for D&C on July 7 due to bleeding and thickened cervical lining. Previous procedure postponed due to medication interaction with anesthesia.  Unfortunately Pheobe is very difficult to assess for preop standpoint.  She has multiple deformities and is very sedentary so is difficult to assess for symptoms.  Her planned procedure is a uterine D&C which is a low to intermediate risk noncardiac surgery. She does not actively have heart failure or persistent anginal symptoms, has never had a stroke, has diabetes but not on insulin , and abnormal renal function.  Given her need for D&C procedure to be performed recommendation will be to proceed with this procedure.  To avoid any delay, after which we can then  potentially be more safe in using more aggressive antiplatelet agents.  The risk of invasive coronary ischemic evaluation at this point with the potential of stent placement would not  only potentially delay her upcoming surgery, but could potentially lead to more complications with the eventual need for Thienopyridine cessation to allow further surgery (if post PCI).  As such, based on our discussion today, I think is best for her to proceed with D&C as scheduled, holding aspirin  5 to 7 days preop.  We can then reassess her after surgery.  Last Ozempic 10/04/23.  Anesthesia team to evaluate on the day of surgery. She reported instructions to hold ASA 5-7 days prior to surgery, last dose 10/13/23.   )         Anesthesia Quick Evaluation

## 2023-10-15 ENCOUNTER — Encounter: Admitting: Physical Therapy

## 2023-10-15 MED ORDER — MISOPROSTOL 200 MCG PO TABS: ORAL_TABLET | ORAL | 0 refills | Status: DC

## 2023-10-19 ENCOUNTER — Encounter (HOSPITAL_COMMUNITY)
Admission: RE | Disposition: A | Discharge status: Home / Self Care | Payer: Self-pay | Source: Home / Self Care | Attending: Obstetrics and Gynecology

## 2023-10-19 ENCOUNTER — Encounter (HOSPITAL_COMMUNITY): Payer: Self-pay | Admitting: Obstetrics and Gynecology

## 2023-10-19 ENCOUNTER — Ambulatory Visit (HOSPITAL_COMMUNITY)
Admission: RE | Admit: 2023-10-19 | Discharge: 2023-10-19 | Disposition: A | Discharge status: Home / Self Care | Attending: Obstetrics and Gynecology | Admitting: Obstetrics and Gynecology

## 2023-10-19 ENCOUNTER — Ambulatory Visit (HOSPITAL_COMMUNITY): Payer: Self-pay | Admitting: Vascular Surgery

## 2023-10-19 ENCOUNTER — Other Ambulatory Visit: Payer: Self-pay

## 2023-10-19 DIAGNOSIS — I10 Essential (primary) hypertension: Secondary | ICD-10-CM

## 2023-10-19 DIAGNOSIS — I25119 Atherosclerotic heart disease of native coronary artery with unspecified angina pectoris: Secondary | ICD-10-CM | POA: Diagnosis not present

## 2023-10-19 DIAGNOSIS — Z87891 Personal history of nicotine dependence: Secondary | ICD-10-CM

## 2023-10-19 DIAGNOSIS — G8929 Other chronic pain: Secondary | ICD-10-CM | POA: Insufficient documentation

## 2023-10-19 DIAGNOSIS — K449 Diaphragmatic hernia without obstruction or gangrene: Secondary | ICD-10-CM | POA: Insufficient documentation

## 2023-10-19 DIAGNOSIS — K219 Gastro-esophageal reflux disease without esophagitis: Secondary | ICD-10-CM | POA: Insufficient documentation

## 2023-10-19 DIAGNOSIS — N950 Postmenopausal bleeding: Secondary | ICD-10-CM | POA: Insufficient documentation

## 2023-10-19 DIAGNOSIS — Z7985 Long-term (current) use of injectable non-insulin antidiabetic drugs: Secondary | ICD-10-CM | POA: Diagnosis not present

## 2023-10-19 DIAGNOSIS — R9389 Abnormal findings on diagnostic imaging of other specified body structures: Secondary | ICD-10-CM | POA: Diagnosis present

## 2023-10-19 DIAGNOSIS — Z79899 Other long term (current) drug therapy: Secondary | ICD-10-CM | POA: Insufficient documentation

## 2023-10-19 DIAGNOSIS — J4489 Other specified chronic obstructive pulmonary disease: Secondary | ICD-10-CM | POA: Diagnosis not present

## 2023-10-19 DIAGNOSIS — G4733 Obstructive sleep apnea (adult) (pediatric): Secondary | ICD-10-CM | POA: Diagnosis not present

## 2023-10-19 DIAGNOSIS — N882 Stricture and stenosis of cervix uteri: Secondary | ICD-10-CM | POA: Diagnosis present

## 2023-10-19 DIAGNOSIS — Z8719 Personal history of other diseases of the digestive system: Secondary | ICD-10-CM | POA: Insufficient documentation

## 2023-10-19 DIAGNOSIS — E039 Hypothyroidism, unspecified: Secondary | ICD-10-CM | POA: Diagnosis not present

## 2023-10-19 DIAGNOSIS — E119 Type 2 diabetes mellitus without complications: Secondary | ICD-10-CM | POA: Diagnosis not present

## 2023-10-19 DIAGNOSIS — R198 Other specified symptoms and signs involving the digestive system and abdomen: Secondary | ICD-10-CM

## 2023-10-19 DIAGNOSIS — Z8249 Family history of ischemic heart disease and other diseases of the circulatory system: Secondary | ICD-10-CM | POA: Insufficient documentation

## 2023-10-19 DIAGNOSIS — Z8711 Personal history of peptic ulcer disease: Secondary | ICD-10-CM | POA: Diagnosis not present

## 2023-10-19 DIAGNOSIS — Z8673 Personal history of transient ischemic attack (TIA), and cerebral infarction without residual deficits: Secondary | ICD-10-CM | POA: Diagnosis not present

## 2023-10-19 DIAGNOSIS — N840 Polyp of corpus uteri: Secondary | ICD-10-CM | POA: Insufficient documentation

## 2023-10-19 DIAGNOSIS — E785 Hyperlipidemia, unspecified: Secondary | ICD-10-CM | POA: Insufficient documentation

## 2023-10-19 DIAGNOSIS — Z5986 Financial insecurity: Secondary | ICD-10-CM | POA: Diagnosis not present

## 2023-10-19 HISTORY — PX: DILATION AND CURETTAGE OF UTERUS: SHX78

## 2023-10-19 LAB — GLUCOSE, CAPILLARY
Glucose-Capillary: 152 mg/dL — ABNORMAL HIGH (ref 70–99)
Glucose-Capillary: 89 mg/dL (ref 70–99)
Glucose-Capillary: 84 mg/dL (ref 70–99)
Glucose-Capillary: 112 mg/dL — ABNORMAL HIGH (ref 70–99)

## 2023-10-19 LAB — CBC
HCT: 40.8 % (ref 36.0–46.0)
Hemoglobin: 12.9 g/dL (ref 12.0–15.0)
MCH: 31.3 pg (ref 26.0–34.0)
MCHC: 31.6 g/dL (ref 30.0–36.0)
MCV: 99 fL (ref 80.0–100.0)
Platelets: 213 K/uL (ref 150–400)
RBC: 4.12 MIL/uL (ref 3.87–5.11)
RDW: 13.9 % (ref 11.5–15.5)
WBC: 7.1 K/uL (ref 4.0–10.5)
nRBC: 0 % (ref 0.0–0.2)

## 2023-10-19 LAB — BASIC METABOLIC PANEL WITH GFR
Anion gap: 12 (ref 5–15)
BUN: 10 mg/dL (ref 8–23)
CO2: 26 mmol/L (ref 22–32)
Calcium: 8.8 mg/dL — ABNORMAL LOW (ref 8.9–10.3)
Chloride: 102 mmol/L (ref 98–111)
Creatinine, Ser: 0.78 mg/dL (ref 0.44–1.00)
GFR, Estimated: >60 mL/min (ref >60)
Glucose, Bld: 129 mg/dL — ABNORMAL HIGH (ref 70–99)
Potassium: 3.4 mmol/L — ABNORMAL LOW (ref 3.5–5.1)
Sodium: 140 mmol/L (ref 135–145)

## 2023-10-19 SURGERY — DILATION AND CURETTAGE
Anesthesia: Monitor Anesthesia Care | Site: Vagina

## 2023-10-19 MED ORDER — PROPOFOL 10 MG/ML IV BOLUS
INTRAVENOUS | Status: DC | PRN
  Administered 2023-10-19: 40 mg via INTRAVENOUS
  Administered 2023-10-19: 30 mg via INTRAVENOUS
  Administered 2023-10-19: 40 mg via INTRAVENOUS
  Administered 2023-10-19: 30 mg via INTRAVENOUS

## 2023-10-19 MED ORDER — AMISULPRIDE (ANTIEMETIC) 5 MG/2ML IV SOLN: 10.0000 mg | Freq: Once | INTRAVENOUS | Status: DC | PRN

## 2023-10-19 MED ORDER — OXYCODONE HCL 5 MG PO TABS: 5.0000 mg | ORAL_TABLET | Freq: Once | ORAL | Status: AC | PRN

## 2023-10-19 MED ORDER — MIDAZOLAM HCL 2 MG/2ML IJ SOLN
INTRAMUSCULAR | Status: AC
  Filled 2023-10-19: qty 2

## 2023-10-19 MED ORDER — LIDOCAINE 2% (20 MG/ML) 5 ML SYRINGE
INTRAMUSCULAR | Status: AC
Start: 2023-10-19 — End: 2023-10-19
  Filled 2023-10-19: qty 5

## 2023-10-19 MED ORDER — PROPOFOL 10 MG/ML IV BOLUS
INTRAVENOUS | Status: AC
  Filled 2023-10-19: qty 40

## 2023-10-19 MED ORDER — LIDOCAINE-EPINEPHRINE 1 %-1:100000 IJ SOLN
INTRAMUSCULAR | Status: DC | PRN
  Administered 2023-10-19: 10 mL

## 2023-10-19 MED ORDER — CHLORHEXIDINE GLUCONATE 0.12 % MT SOLN
15.0000 mL | Freq: Once | OROMUCOSAL | Status: AC
  Administered 2023-10-19: 15 mL via OROMUCOSAL
  Filled 2023-10-19: qty 15

## 2023-10-19 MED ORDER — FENTANYL CITRATE (PF) 100 MCG/2ML IJ SOLN
INTRAMUSCULAR | Status: AC
  Filled 2023-10-19: qty 2

## 2023-10-19 MED ORDER — ONDANSETRON HCL 4 MG/2ML IJ SOLN
INTRAMUSCULAR | Status: DC | PRN
  Administered 2023-10-19: 4 mg via INTRAVENOUS

## 2023-10-19 MED ORDER — ORAL CARE MOUTH RINSE: 15.0000 mL | Freq: Once | OROMUCOSAL | Status: AC

## 2023-10-19 MED ORDER — DEXAMETHASONE SODIUM PHOSPHATE 10 MG/ML IJ SOLN
INTRAMUSCULAR | Status: AC
  Filled 2023-10-19: qty 1

## 2023-10-19 MED ORDER — INSULIN ASPART 100 UNIT/ML IJ SOLN
0.0000 [IU] | INTRAMUSCULAR | Status: DC | PRN
  Administered 2023-10-19: 2 [IU] via SUBCUTANEOUS
  Filled 2023-10-19: qty 1

## 2023-10-19 MED ORDER — LACTATED RINGERS IV SOLN: INTRAVENOUS | Status: DC

## 2023-10-19 MED ORDER — PROPOFOL 500 MG/50ML IV EMUL
INTRAVENOUS | Status: DC | PRN
  Administered 2023-10-19: 100 ug/kg/min via INTRAVENOUS

## 2023-10-19 MED ORDER — FENTANYL CITRATE (PF) 100 MCG/2ML IJ SOLN: 25.0000 ug | INTRAMUSCULAR | Status: DC | PRN

## 2023-10-19 MED ORDER — OXYCODONE HCL 5 MG/5ML PO SOLN
ORAL | Status: AC
  Filled 2023-10-19: qty 5

## 2023-10-19 MED ORDER — ONDANSETRON HCL 4 MG/2ML IJ SOLN
INTRAMUSCULAR | Status: AC
  Filled 2023-10-19: qty 2

## 2023-10-19 MED ORDER — OXYCODONE HCL 5 MG/5ML PO SOLN
5.0000 mg | Freq: Once | ORAL | Status: AC | PRN
  Administered 2023-10-19: 5 mg via ORAL

## 2023-10-19 MED ORDER — ACETAMINOPHEN 10 MG/ML IV SOLN
INTRAVENOUS | Status: AC
  Filled 2023-10-19: qty 100

## 2023-10-19 MED ORDER — FENTANYL CITRATE (PF) 250 MCG/5ML IJ SOLN
INTRAMUSCULAR | Status: AC
  Filled 2023-10-19: qty 5

## 2023-10-19 MED ORDER — DEXAMETHASONE SODIUM PHOSPHATE 10 MG/ML IJ SOLN
INTRAMUSCULAR | Status: DC | PRN
  Administered 2023-10-19: 5 mg via INTRAVENOUS

## 2023-10-19 MED ORDER — DEXMEDETOMIDINE HCL IN NACL 200 MCG/50ML IV SOLN
INTRAVENOUS | Status: DC | PRN
  Administered 2023-10-19: 8 ug via INTRAVENOUS

## 2023-10-19 MED ORDER — TRANEXAMIC ACID-NACL 1000-0.7 MG/100ML-% IV SOLN
INTRAVENOUS | Status: AC
  Filled 2023-10-19: qty 100

## 2023-10-19 MED ORDER — ACETAMINOPHEN 10 MG/ML IV SOLN
1000.0000 mg | Freq: Once | INTRAVENOUS | Status: DC | PRN
  Administered 2023-10-19: 1000 mg via INTRAVENOUS

## 2023-10-19 MED ORDER — LIDOCAINE-EPINEPHRINE 1 %-1:100000 IJ SOLN
INTRAMUSCULAR | Status: AC
  Filled 2023-10-19: qty 1

## 2023-10-19 MED ORDER — MIDAZOLAM HCL 2 MG/2ML IJ SOLN
INTRAMUSCULAR | Status: DC | PRN
  Administered 2023-10-19 (×2): 1 mg via INTRAVENOUS

## 2023-10-19 MED ORDER — 0.9 % SODIUM CHLORIDE (POUR BTL) OPTIME
TOPICAL | Status: DC | PRN
  Administered 2023-10-19: 1000 mL

## 2023-10-19 SURGICAL SUPPLY — 11 items
GAUZE 4X4 16PLY ~~LOC~~+RFID DBL (SPONGE) ×2 IMPLANT
GLOVE BIO SURGEON STRL SZ7 (GLOVE) ×2 IMPLANT
GLOVE BIOGEL PI IND STRL 7.0 (GLOVE) ×2 IMPLANT
GOWN STRL REUS W/ TWL XL LVL3 (GOWN DISPOSABLE) ×2 IMPLANT
KIT TURNOVER KIT B (KITS) ×2 IMPLANT
NS IRRIG 500ML POUR BTL (IV SOLUTION) ×2 IMPLANT
PACK VAGINAL MINOR WOMEN LF (CUSTOM PROCEDURE TRAY) ×2 IMPLANT
PAD OB MATERNITY 11 LF (PERSONAL CARE ITEMS) ×2 IMPLANT
PAD PREP 24X48 CUFFED NSTRL (MISCELLANEOUS) ×2 IMPLANT
SLEEVE SCD COMPRESS KNEE MED (STOCKING) ×2 IMPLANT
TOWEL OR 17X24 6PK STRL BLUE (TOWEL DISPOSABLE) ×2 IMPLANT

## 2023-10-19 NOTE — Brief Op Note (Signed)
 10/19/2023  2:21 PM  PATIENT:  Reena LITTIE Clover  68 y.o. female  PRE-OPERATIVE DIAGNOSIS:  Cervical stenosis  thickened endometrium  POST-OPERATIVE DIAGNOSIS:  Cervical stenosis thickened endometrium  PROCEDURE:  Procedure(s) with comments: DILATION AND CURETTAGE (N/A) - ANESTHESIA REQUEST TO BE UNDER IV CONSCIOUS SEDATION LIKE FOR A COLONOSCOPY IF POSSIBLE  SURGEON:  Surgeons and Role:    DEWAINE Jeralyn Crutch, MD - Primary  PHYSICIAN ASSISTANT: N/A  ASSISTANTS: none   ANESTHESIA:   paracervical block  EBL:  5 mL   BLOOD ADMINISTERED:none  DRAINS: none   LOCAL MEDICATIONS USED:  LIDOCAINE    SPECIMEN:  Source of Specimen:  endometrial curettings  DISPOSITION OF SPECIMEN:  PATHOLOGY  COUNTS:  YES  TOURNIQUET:  * No tourniquets in log *  DICTATION: .Note written in EPIC  PLAN OF CARE: Discharge to home after PACU  PATIENT DISPOSITION:  PACU - hemodynamically stable.   Delay start of Pharmacological VTE agent (>24hrs) due to surgical blood loss or risk of bleeding: not applicable

## 2023-10-19 NOTE — Op Note (Signed)
 Grace [name] PROCEDURE DATE: 10/19/2023  PREOPERATIVE DIAGNOSIS: postmenopausal bleeding  POSTOPERATIVE DIAGNOSIS: postmenopausal bleeding PROCEDURE:    dilation and curettage SURGEON: Carter Quarry, MD ASSISTANT:  n/a  INDICATIONS: 68 y.o. G0P0000 with PMB.  Risks of surgery were discussed with the patient including but not limited to: bleeding which may require transfusion; infection which may require antibiotics; injury to surrounding organs; need for additional procedures including laparotomy;  and other postoperative/anesthesia complications. Written informed consent was obtained.    FINDINGS:  Normal external genitalia, scar of external cervical os  ANESTHESIA: General, paracervical block INTRAVENOUS FLUIDS: 300  ml of LR ESTIMATED BLOOD LOSS:  5 ml SPECIMENS: endometrial curettings COMPLICATIONS:  None immediate.   PROCEDURE: The patient was taken to the operating room and placed under MAC. SCDs were in place.  Time out was performed. Patient was placed in dorsolithotomy in Pillager stirrups. She was prepped and draped in the usual sterile fashion. A speculum was placeed in the vagina. The cervix was visualized anteriorly and grasped with a single-tooth tenaculum. Paracervical block was performed with 1% lidocaine  with epinephrine , 10cc total. The cervix was noted to have a scar overlying the external os. The spinal needle was used to make a small puncture in the external os. Sequential dilation was performed with half hegar dilators followed by Fredirick dilators. Sharp curettage was performed in all 4 quadrants. All instruments were removed from the vagina. All instrument, needle and lap counts were correct x2. The patient was awakened and is recovering in stable condition.   Carter Quarry, MD Minimally Invasive Gynecologic Surgery  Obstetrics and Gynecology, [facility], [facility] 10/19/2023

## 2023-10-19 NOTE — Transfer of Care (Signed)
 Immediate Anesthesia Transfer of Care Note  Patient: Grace [name]  Procedure(s) Performed: DILATION AND CURETTAGE (Vagina )  Patient Location: PACU  Anesthesia Type:MAC  Level of Consciousness: awake, alert , patient cooperative, and responds to stimulation  Airway & Oxygen  Therapy: Patient Spontanous Breathing and Patient connected to face mask oxygen   Post-op Assessment: Report given to RN, Post -op Vital signs reviewed and stable, and Patient moving all extremities X 4  Post vital signs: Reviewed and stable  Last Vitals:  Vitals Value Taken Time  BP 116/55 10/19/23 14:25  Temp 36.5 C 10/19/23 14:25  Pulse 79 10/19/23 14:25  Resp 20 10/19/23 14:25  SpO2 98 % 10/19/23 14:25  Vitals shown include unfiled device data.  Last Pain:  Vitals:   10/19/23 0912  TempSrc:   PainSc: 9       Patients Stated Pain Goal: 3 (10/19/23 0912)  Complications: No notable events documented.

## 2023-10-19 NOTE — H&P (Signed)
 OB/GYN Pre-Op History and Physical  Grace [name] is a 68 y.o. G0P0000 presenting for D&C.       Past Medical History:  Diagnosis Date   Abnormal vaginal bleeding    Anxiety    Asthma    Barrett's esophagus    with high grade dysplasia per endoscopy 05/2005 // followed by Dr. Lupita Commander ([facility])   Chronic back pain    Complication of anesthesia    per patient she went in respiratory failure in recovery room in 2017   COPD (chronic obstructive pulmonary disease) (HCC)    Diabetes mellitus without complication (HCC)    type 2   Duodenal ulcer disease    thought to be contributed at least partly by overuse of NSAIDs and salicylates (goody powdr)   Endometrial polyp 03/2003   s/p resection in 03/2003. 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   Migraine    Nevus    Personal history of failed conscious sedation 08/11/2011   Sleep apnea    does not use cpap   Stroke (HCC) 10/2000   Tachycardia    Thyroid  disease    Tobacco abuse 04/2012   quit    Past Surgical History:  Procedure Laterality Date   ADENOIDECTOMY     APPENDECTOMY  2017   BALLOON DILATION N/A 06/14/2020   Procedure: BALLOON DILATION;  Surgeon: Wilhelmenia Aloha Raddle., MD;  Location: [facility] ENDOSCOPY;  Service: Gastroenterology;  Laterality: N/A;   BIOPSY   02/15/2020   Procedure: BIOPSY;  Surgeon: Burnette Fallow, MD;  Location: [facility] ENDOSCOPY;  Service: Endoscopy;;   BIOPSY  06/14/2020   Procedure: BIOPSY;  Surgeon: Wilhelmenia Aloha Raddle., MD;  Location: [facility] ENDOSCOPY;  Service: Gastroenterology;;   BIOPSY  03/02/2023   Procedure: BIOPSY;  Surgeon: Saintclair Jasper, MD;  Location: [facility] ENDOSCOPY;  Service: Gastroenterology;;   BRONCHIAL BIOPSY  03/15/2020   Procedure: BRONCHIAL BIOPSIES;  Surgeon: Brenna Adine LITTIE, DO;  Location: [facility] ENDOSCOPY;  Service: Pulmonary;;   BRONCHIAL BRUSHINGS  03/15/2020   Procedure: BRONCHIAL BRUSHINGS;  Surgeon: Brenna Adine LITTIE, DO;  Location: [facility] ENDOSCOPY;  Service: Pulmonary;;   BRONCHIAL NEEDLE ASPIRATION BIOPSY  03/15/2020   Procedure: BRONCHIAL NEEDLE ASPIRATION BIOPSIES;  Surgeon: Brenna Adine LITTIE, DO;  Location: [facility] ENDOSCOPY;  Service: Pulmonary;;   BRONCHIAL WASHINGS  03/15/2020   Procedure: BRONCHIAL WASHINGS;  Surgeon: Brenna Adine LITTIE, DO;  Location: [facility] ENDOSCOPY;  Service: Pulmonary;;   CHOLECYSTECTOMY     COLONOSCOPY  02/07/2010   COLONOSCOPY WITH PROPOFOL  N/A 02/15/2020   Procedure: COLONOSCOPY WITH PROPOFOL ;  Surgeon: Burnette Fallow, MD;  Location: [facility] ENDOSCOPY;  Service: Endoscopy;  Laterality: N/A;   DILATION AND CURETTAGE OF UTERUS     ENDOSCOPIC MUCOSAL RESECTION N/A 06/14/2020  Procedure: ENDOSCOPIC MUCOSAL RESECTION;  Surgeon: Wilhelmenia Aloha Raddle., MD;  Location: [facility] ENDOSCOPY;  Service: Gastroenterology;  Laterality: N/A;   ESOPHAGOGASTRODUODENOSCOPY (EGD) WITH PROPOFOL  N/A 02/15/2020   Procedure: ESOPHAGOGASTRODUODENOSCOPY (EGD) WITH PROPOFOL ;  Surgeon: Burnette Fallow, MD;  Location: [facility] ENDOSCOPY;  Service: Endoscopy;  Laterality: N/A;   ESOPHAGOGASTRODUODENOSCOPY (EGD) WITH PROPOFOL  N/A 06/14/2020   Procedure: ESOPHAGOGASTRODUODENOSCOPY (EGD) WITH PROPOFOL ;  Surgeon: Wilhelmenia Aloha Raddle., MD;  Location: [facility] ENDOSCOPY;  Service: Gastroenterology;  Laterality: N/A;   ESOPHAGOGASTRODUODENOSCOPY (EGD) WITH  PROPOFOL  Left 03/02/2023   Procedure: ESOPHAGOGASTRODUODENOSCOPY (EGD) WITH PROPOFOL ;  Surgeon: Saintclair Jasper, MD;  Location: [facility] ENDOSCOPY;  Service: Gastroenterology;  Laterality: Left;   FOREIGN BODY REMOVAL  06/14/2020   Procedure: FOREIGN BODY REMOVAL;  Surgeon: Wilhelmenia Aloha Raddle., MD;  Location: [facility] ENDOSCOPY;  Service: Gastroenterology;;   HYSTEROSCOPY     LAPAROSCOPIC APPENDECTOMY N/A 09/23/2015   Procedure: APPENDECTOMY LAPAROSCOPIC;  Surgeon: Dann Hummer, MD;  Location: [facility] OR;  Service: General;  Laterality: N/A;   MYOMECTOMY  1990, 1997   x 2 - In 1997, noted to have extensive pelvic adhesions and BL tubal obstruction   POLYPECTOMY  02/15/2020   Procedure: POLYPECTOMY;  Surgeon: Burnette Fallow, MD;  Location: [facility] ENDOSCOPY;  Service: Endoscopy;;   TONSILLECTOMY     TRANSTHORACIC ECHOCARDIOGRAM  06/2014    EF 55-60%.  No RWMA. Gr 1 DD. Cannot R/o PFO (consider bubble study).   UPPER ESOPHAGEAL ENDOSCOPIC ULTRASOUND (EUS) N/A 06/14/2020   Procedure: UPPER ESOPHAGEAL ENDOSCOPIC ULTRASOUND (EUS);  Surgeon: Wilhelmenia Aloha Raddle., MD;  Location: [facility] ENDOSCOPY;  Service: Gastroenterology;  Laterality: N/A;   UPPER GASTROINTESTINAL ENDOSCOPY  12/18/2009   VIDEO BRONCHOSCOPY WITH ENDOBRONCHIAL NAVIGATION N/A 03/15/2020   Procedure: VIDEO BRONCHOSCOPY WITH ENDOBRONCHIAL NAVIGATION;  Surgeon: Brenna Adine CROME, DO;  Location: [facility] ENDOSCOPY;  Service: Pulmonary;  Laterality: N/A;   VIDEO BRONCHOSCOPY WITH ENDOBRONCHIAL ULTRASOUND  03/15/2020   Procedure: VIDEO BRONCHOSCOPY WITH ENDOBRONCHIAL ULTRASOUND;  Surgeon: Brenna Adine CROME, DO;  Location: [facility] ENDOSCOPY;  Service: Pulmonary;;   WISDOM TOOTH EXTRACTION      OB History  Gravida Para Term Preterm AB Living  0 0 0 0 0 0  SAB IAB Ectopic Multiple Live Births  0 0 0 0     Social History   Socioeconomic History   Marital status: Divorced    Spouse name: Not on file   Number of children: 0   Years of education: 12   Highest education  level: High school graduate  Occupational History   Occupation: Electronic record    Comment: [employer]  Tobacco Use   Smoking status: Former    Current packs/day: 0.00    Average packs/day: 3.0 packs/day for 50.0 years (150.0 ttl pk-yrs)    Types: Cigarettes    Start date: 09/22/1965    Quit date: 09/23/2015    Years since quitting: 8.0   Smokeless tobacco: Never   Tobacco comments:    Counseling sheet 07-2011   Vaping Use   Vaping status: Never Used  Substance and Sexual Activity   Alcohol use: No    Alcohol/week: 0.0 standard drinks of alcohol   Drug use: No   Sexual activity: Yes    Birth control/protection: Post-menopausal  Other Topics Concern   Not on file  Social History Narrative   Financial assistance approved for 100% discount at [facility] and has [facility] card per [employer]   01/15/2010      Diet- N/A   Caffeine- Goody powders   Married-Divorced  House-1 and lives with mother   Pets-2   Current/past profession-[employer]   Exercise-No   Living will-No   DNR-No   POA/HPOA-No         Social Drivers of Health   Financial Resource Strain: Medium Risk (02/23/2018)   Overall Financial Resource Strain (CARDIA)    Difficulty of Paying Living Expenses: Somewhat hard  Food Insecurity: No Food Insecurity (02/28/2023)   Hunger Vital Sign    Worried About Running Out of Food in the Last Year: Never true    Ran Out of Food in the Last Year: Never true  Transportation Needs: No Transportation Needs (02/28/2023)   PRAPARE - Administrator, Civil Service (Medical): No    Lack of Transportation (Non-Medical): No  Physical Activity: Inactive (02/23/2018)   Exercise Vital Sign    Days of Exercise per Week: 0 days    Minutes of Exercise per Session: 0 min  Stress: Stress Concern Present (02/23/2018)   [employer] of Occupational Health - Occupational Stress Questionnaire    Feeling of Stress : Rather much  Social Connections: Unknown (09/02/2022)   Received  from [facility]   Social Network    Social Network: Not on file    Family History  Problem Relation Age of Onset   COPD Mother    Heart disease Father 96       had 5 heart attacks, first in late 50s   COPD Father    Colon cancer Neg Hx    Esophageal cancer Neg Hx    Inflammatory bowel disease Neg Hx    Liver disease Neg Hx    Pancreatic cancer Neg Hx    Rectal cancer Neg Hx    Stomach cancer Neg Hx    Thyroid  disease Neg Hx     Medications Prior to Admission  Medication Sig Dispense Refill Last Dose/Taking   albuterol  (PROAIR  HFA) 108 (90 Base) MCG/ACT inhaler INHALE 2 PUFFS EVERY 6  HOURS AS NEEDED FOR  SHORTNESS OF BREATH OR  WHEEZING 34 g 3 10/18/2023   Aspirin -Acetaminophen -Caffeine (GOODY HEADACHE PO) Take 1 Package by mouth daily as needed (headache).   Past Week   bisoprolol  (ZEBETA ) 10 MG tablet Take 10 mg by mouth daily.   10/19/2023 Morning   Budeson-Glycopyrrol-Formoterol  (BREZTRI  AEROSPHERE) 160-9-4.8 MCG/ACT AERO Inhale 2 puffs into the lungs in the morning and at bedtime. 3 each 3 10/19/2023 at  6:45 AM   calcium  carbonate (TUMS - DOSED IN MG ELEMENTAL CALCIUM ) 500 MG chewable tablet Chew 1 tablet by mouth daily as needed for indigestion or heartburn.   Past Week   Collagen-Vitamin C-Biotin (COLLAGEN PO) Take 1 tablet by mouth daily. peptide   Past Week   diphenhydrAMINE  (BENADRYL ) 25 MG tablet Take 50 mg by mouth at bedtime as needed for sleep.   10/18/2023   docusate sodium  (COLACE) 100 MG capsule Take 300 mg by mouth daily.   Past Week   estradiol  (ESTRACE  VAGINAL) 0.1 MG/GM vaginal cream Place 1 Applicatorful vaginally at bedtime. 42.5 g 12 Past Week   ezetimibe  (ZETIA ) 10 MG tablet Take 10 mg by mouth daily.   10/18/2023   fluticasone  (FLONASE ) 50 MCG/ACT nasal spray USE 2 SPRAYS IN BOTH NOSTRILS  DAILY 16 g 5 Past Week   hydrALAZINE  (APRESOLINE ) 50 MG tablet Take 1 tablet (50 mg total) by mouth 3 (three) times daily. May take an additional 50 mg if needed for blood  pressureif still elevated is greater than or equal to  160 systolic after 1 hour recheck of blood pressure   10/19/2023 at  4:45 AM   ipratropium-albuterol  (DUONEB) 0.5-2.5 (3) MG/3ML SOLN Take 3 mLs by nebulization every 6 (six) hours as needed. 360 mL 6 10/19/2023 at  7:00 AM   isosorbide  mononitrate (IMDUR ) 30 MG 24 hr tablet Take 3 tablets (90 mg total) by mouth daily. 821 tablet 3 10/19/2023 at  4:45 AM   levothyroxine  (SYNTHROID ) 200 MCG tablet Take 200 mcg by mouth at bedtime.   10/18/2023   losartan  (COZAAR ) 100 MG tablet TAKE 1 TABLET BY MOUTH  DAILY (Patient taking differently: Take 100 mg by mouth at bedtime.) 90 tablet 3 10/18/2023   misoprostol  (CYTOTEC ) 200 MCG tablet Place two tablets in the vagina the night prior to your next clinic appointment 2 tablet 0 10/18/2023   Multiple Vitamins-Minerals (HAIR SKIN & NAILS) TABS Take 1 tablet by mouth daily.   Past Week   omeprazole  (PRILOSEC) 40 MG capsule Take 40 mg by mouth daily.   10/19/2023 Morning   pregabalin  (LYRICA ) 50 MG capsule Take 50 mg by mouth daily.   10/18/2023   REPATHA  SURECLICK 140 MG/ML SOAJ INJECT 1 PEN SUBCUTANEOUSLY  EVERY 2 WEEKS 6 mL 3 Past Week   traMADol  (ULTRAM ) 50 MG tablet Take 50 mg by mouth daily as needed for moderate pain (pain score 4-6) or severe pain (pain score 7-10).   Past Week   valACYclovir  (VALTREX ) 500 MG tablet TAKE 1 TABLET BY MOUTH  DAILY 90 tablet 3 10/19/2023 at  4:45 AM   Vibegron  75 MG TABS Take 1 tablet (75 mg total) by mouth daily. 90 tablet 3 10/18/2023   aspirin  EC 81 MG tablet Take 1 tablet (81 mg total) by mouth daily. Swallow whole. (Patient not taking: Reported on 10/07/2023) 30 tablet 0    furosemide  (LASIX ) 40 MG tablet TAKE 1 TABLET BY MOUTH DAILY AS  NEEDED FOR WEIGHT GAIN OF 3 LB  OVER AT NIGHT OR 5 LB IN 1 WEEK 100 tablet 2 More than a month   glucose blood (ONETOUCH VERIO) test strip Use to check blood sugar twice daily. Dx: E11.9 300 each 1    ibuprofen (ADVIL) 200 MG tablet Take 800 mg by mouth  every 6 (six) hours as needed for moderate pain (pain score 4-6) or mild pain (pain score 1-3).      Lancets (ONETOUCH DELICA PLUS LANCET33G) MISC USE TO CHECK BLOOD SUGAR  TWICE DAILY 200 each 3    nitroGLYCERIN  (NITROSTAT ) 0.4 MG SL tablet Place 0.4 mg under the tongue every 5 (five) minutes as needed for chest pain.      OZEMPIC, 0.25 OR 0.5 MG/DOSE, 2 MG/3ML SOPN Inject 0.5 mg into the skin once a week.   10/04/2023   pantoprazole  (PROTONIX ) 40 MG tablet Take 1 tablet (40 mg total) by mouth 2 (two) times daily. (Patient not taking: Reported on 10/07/2023) 60 tablet 1    Respiratory Therapy Supplies (NEBULIZER/TUBING/MOUTHPIECE) KIT 1 application by Does not apply route every 6 (six) hours as needed. FAX TO [phone] 1 kit 11     Allergies  Allergen Reactions   Amlodipine      Cramps    Codeine     REACTION: headaches   Gabapentin      headache   Prozac [Fluoxetine Hcl] Other (See Comments)    Suicidal ideations    Review of Systems: Negative except for what is mentioned in HPI.     Physical Exam: BP 137/78 (BP Location:  Right Arm)   Pulse 73   Temp 98.6 F (37 C) (Oral)   Resp 18   Ht 5' 3 (1.6 m)   Wt 84.8 kg   LMP 02/27/2014   SpO2 94%   BMI 33.13 kg/m  CONSTITUTIONAL: Well-developed, well-nourished and in no acute distress.  HENT:  Normocephalic, atraumatic, External right and left ear normal. Oropharynx is clear and moist EYES: Conjunctivae and EOM are normal. Pupils are equal, round, and reactive to light. No scleral icterus.  NECK: Normal range of motion, supple, no masses SKIN: Skin is warm and dry. No rash noted. Not diaphoretic. No erythema. No pallor. NEUROLGIC: Alert and oriented to person, place, and time. Normal reflexes, muscle tone coordination. No cranial nerve deficit noted. PSYCHIATRIC: Normal mood and affect. Normal behavior. Normal judgment and thought content. RESPIRATORY: Normal effort PELVIC: Deferred   Pertinent Labs/Studies:   Results for  orders placed or performed during the hospital encounter of 10/19/23 (from the past 72 hours)  Glucose, capillary     Status: Abnormal   Collection Time: 10/19/23  8:17 AM  Result Value Ref Range   Glucose-Capillary 152 (H) 70 - 99 mg/dL    Comment: Glucose reference range applies only to samples taken after fasting for at least 8 hours.   Comment 1 Notify RN    Comment 2 Document in Chart   CBC per protocol     Status: None   Collection Time: 10/19/23  8:29 AM  Result Value Ref Range   WBC 7.1 4.0 - 10.5 K/uL   RBC 4.12 3.87 - 5.11 MIL/uL   Hemoglobin 12.9 12.0 - 15.0 g/dL   HCT 59.1 63.9 - 53.9 %   MCV 99.0 80.0 - 100.0 fL   MCH 31.3 26.0 - 34.0 pg   MCHC 31.6 30.0 - 36.0 g/dL   RDW 86.0 88.4 - 84.4 %   Platelets 213 150 - 400 K/uL   nRBC 0.0 0.0 - 0.2 %    Comment: Performed at [facility], [address], [place], [place] [address]  Basic metabolic panel per protocol     Status: Abnormal   Collection Time: 10/19/23  8:29 AM  Result Value Ref Range   Sodium 140 135 - 145 mmol/L   Potassium 3.4 (L) 3.5 - 5.1 mmol/L   Chloride 102 98 - 111 mmol/L   CO2 26 22 - 32 mmol/L   Glucose, Bld 129 (H) 70 - 99 mg/dL    Comment: Glucose reference range applies only to samples taken after fasting for at least 8 hours.   BUN 10 8 - 23 mg/dL   Creatinine, Ser 9.21 0.44 - 1.00 mg/dL   Calcium  8.8 (L) 8.9 - 10.3 mg/dL   GFR, Estimated >39 >39 mL/min    Comment: (NOTE) Calculated using the CKD-EPI Creatinine Equation (2021)    Anion gap 12 5 - 15    Comment: Performed at [facility], [address] [place], [place] [address]  Glucose, capillary     Status: None   Collection Time: 10/19/23 10:55 AM  Result Value Ref Range   Glucose-Capillary 89 70 - 99 mg/dL    Comment: Glucose reference range applies only to samples taken after fasting for at least 8 hours.   Comment 1 Notify RN    Comment 2 Document in Chart        Assessment and Plan :Grace [name] is a 68 y.o.  G0P0000 here for endometrial sampling for PMB.  Patient desires surgical management with D&C.  The risks of surgery were discussed in detail with the patient including but not limited to: bleeding which may require transfusion or reoperation; infection which may require prolonged hospitalization or re-hospitalization and antibiotic therapy; injury to bowel, bladder, ureters and major vessels or other surrounding organs which may lead to other procedures; formation of adhesions; need for additional procedures including laparotomy or subsequent procedures secondary to intraoperative injury or abnormal pathology; thromboembolic phenomenon; incisional problems and other postoperative or anesthesia complications. The postoperative expectations were also discussed in detail. The patient also understands the alternative treatment options which were discussed in full. All questions were answered.     Jonanthony Nahar, M.D. Minimally Invasive Gynecologic Surgery and Pelvic Pain Specialist Attending Obstetrician & Gynecologist, [facility] for [employer], [facility]

## 2023-10-19 NOTE — Anesthesia Postprocedure Evaluation (Signed)
 Anesthesia Post Note  Patient: Grace [name]  Procedure(s) Performed: DILATION AND CURETTAGE (Vagina )     Patient location during evaluation: PACU Anesthesia Type: MAC Level of consciousness: awake Pain management: pain level controlled Vital Signs Assessment: post-procedure vital signs reviewed and stable Respiratory status: spontaneous breathing, nonlabored ventilation and respiratory function stable Cardiovascular status: stable and blood pressure returned to baseline Postop Assessment: no apparent nausea or vomiting Anesthetic complications: no   No notable events documented.  Last Vitals:  Vitals:   10/19/23 1445 10/19/23 1500  BP: (!) 125/57 127/62  Pulse: 77 66  Resp: 18 (!) 7  Temp:    SpO2: 91% 97%    Last Pain:  Vitals:   10/19/23 1425  TempSrc:   PainSc: 0-No pain                 Delon Aisha Arch

## 2023-10-19 NOTE — Anesthesia Procedure Notes (Signed)
 Date/Time: 10/19/2023 1:46 PM  Performed by: Jolynn Mage, CRNAPre-anesthesia Checklist: Patient identified, Emergency Drugs available, Suction available, Timeout performed and Patient being monitored Patient Re-evaluated:Patient Re-evaluated prior to induction Oxygen  Delivery Method: Simple face mask

## 2023-10-20 ENCOUNTER — Encounter (HOSPITAL_COMMUNITY): Payer: Self-pay | Admitting: Obstetrics and Gynecology

## 2023-10-20 LAB — SURGICAL PATHOLOGY

## 2023-10-22 ENCOUNTER — Ambulatory Visit: Payer: Self-pay | Admitting: Obstetrics and Gynecology

## 2023-12-07 DIAGNOSIS — G4733 Obstructive sleep apnea (adult) (pediatric): Secondary | ICD-10-CM

## 2023-12-07 NOTE — Telephone Encounter (Signed)
 Ok to re-order HST. Needs follow up in 6-8 weeks to review. Thanks.

## 2023-12-07 NOTE — Telephone Encounter (Signed)
 Can we reorder sleep or needs to wait until OV?

## 2023-12-08 ENCOUNTER — Encounter: Payer: Self-pay | Admitting: Nurse Practitioner

## 2023-12-08 ENCOUNTER — Ambulatory Visit: Attending: Nurse Practitioner | Admitting: Nurse Practitioner

## 2023-12-08 ENCOUNTER — Ambulatory Visit (INDEPENDENT_AMBULATORY_CARE_PROVIDER_SITE_OTHER)

## 2023-12-08 ENCOUNTER — Other Ambulatory Visit: Payer: Self-pay

## 2023-12-08 ENCOUNTER — Other Ambulatory Visit (HOSPITAL_COMMUNITY): Payer: Self-pay

## 2023-12-08 VITALS — BP 128/88 | HR 110 | Ht 63.0 in | Wt 176.8 lb

## 2023-12-08 DIAGNOSIS — J449 Chronic obstructive pulmonary disease, unspecified: Secondary | ICD-10-CM

## 2023-12-08 DIAGNOSIS — R000 Tachycardia, unspecified: Secondary | ICD-10-CM | POA: Diagnosis not present

## 2023-12-08 DIAGNOSIS — I25118 Atherosclerotic heart disease of native coronary artery with other forms of angina pectoris: Secondary | ICD-10-CM

## 2023-12-08 DIAGNOSIS — R002 Palpitations: Secondary | ICD-10-CM

## 2023-12-08 DIAGNOSIS — G4733 Obstructive sleep apnea (adult) (pediatric): Secondary | ICD-10-CM

## 2023-12-08 DIAGNOSIS — I517 Cardiomegaly: Secondary | ICD-10-CM

## 2023-12-08 DIAGNOSIS — G720 Drug-induced myopathy: Secondary | ICD-10-CM

## 2023-12-08 DIAGNOSIS — I5189 Other ill-defined heart diseases: Secondary | ICD-10-CM

## 2023-12-08 DIAGNOSIS — T466X5D Adverse effect of antihyperlipidemic and antiarteriosclerotic drugs, subsequent encounter: Secondary | ICD-10-CM

## 2023-12-08 DIAGNOSIS — E119 Type 2 diabetes mellitus without complications: Secondary | ICD-10-CM

## 2023-12-08 DIAGNOSIS — I10 Essential (primary) hypertension: Secondary | ICD-10-CM

## 2023-12-08 DIAGNOSIS — Z8673 Personal history of transient ischemic attack (TIA), and cerebral infarction without residual deficits: Secondary | ICD-10-CM

## 2023-12-08 DIAGNOSIS — E785 Hyperlipidemia, unspecified: Secondary | ICD-10-CM

## 2023-12-08 LAB — LIPID PANEL

## 2023-12-08 MED ORDER — BISOPROLOL FUMARATE 10 MG PO TABS
15.0000 mg | ORAL_TABLET | Freq: Every day | ORAL | 2 refills | Status: DC
  Filled 2023-12-08: qty 135, 90d supply, fill #0
  Filled 2023-12-08: qty 90, 60d supply, fill #0

## 2023-12-08 NOTE — H&P (View-Only) (Signed)
 Office Visit    Patient Name: Grace [name] Date of Encounter: 12/08/2023  Primary Care Provider:  Delores Rojelio Caldron, NP Primary Cardiologist:  Alm Clay, MD  Chief Complaint    68 year old female with a history of CAD, hypertension, hyperlipidemia, hypothyroidism, type 2 diabetes, CVA, OSA, COPD and GERD who presents for follow-up related to CAD.   Past Medical History    Past Medical History:  Diagnosis Date   Abnormal vaginal bleeding    Anxiety    Asthma    Barrett's esophagus    with high grade dysplasia per endoscopy 05/2005 // followed by Dr. Lupita Commander ([facility])   Chronic back pain    Complication of anesthesia    per patient she went in respiratory failure in recovery room in 2017   COPD (chronic obstructive pulmonary disease) (HCC)    Diabetes mellitus without complication (HCC)    type 2   Duodenal ulcer disease    thought to be contributed at least partly by overuse of NSAIDs and salicylates (goody powdr)   Endometrial polyp 03/2003   s/p resection in 03/2003. 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   Migraine    Nevus    Personal history of failed conscious sedation 08/11/2011   Sleep apnea    does not use cpap   Stroke (HCC) 10/2000   Tachycardia    Thyroid  disease    Tobacco abuse 04/2012    quit   Past Surgical History:  Procedure Laterality Date   ADENOIDECTOMY     APPENDECTOMY  2017   BALLOON DILATION N/A 06/14/2020   Procedure: BALLOON DILATION;  Surgeon: Wilhelmenia Aloha Raddle., MD;  Location: [facility] ENDOSCOPY;  Service: Gastroenterology;  Laterality: N/A;   BIOPSY  02/15/2020   Procedure: BIOPSY;  Surgeon: Burnette Fallow, MD;  Location: [facility] ENDOSCOPY;  Service: Endoscopy;;   BIOPSY  06/14/2020   Procedure: BIOPSY;  Surgeon: Wilhelmenia Aloha Raddle., MD;  Location: [facility] ENDOSCOPY;  Service: Gastroenterology;;   BIOPSY  03/02/2023   Procedure: BIOPSY;  Surgeon: Saintclair Jasper, MD;  Location: [facility] ENDOSCOPY;  Service: Gastroenterology;;   BRONCHIAL BIOPSY  03/15/2020   Procedure: BRONCHIAL BIOPSIES;  Surgeon: Brenna Adine LITTIE, DO;  Location: [facility] ENDOSCOPY;  Service: Pulmonary;;   BRONCHIAL BRUSHINGS  03/15/2020   Procedure: BRONCHIAL BRUSHINGS;  Surgeon: Brenna Adine LITTIE, DO;  Location: [facility] ENDOSCOPY;  Service: Pulmonary;;   BRONCHIAL NEEDLE ASPIRATION BIOPSY  03/15/2020   Procedure: BRONCHIAL NEEDLE ASPIRATION BIOPSIES;  Surgeon: Brenna Adine LITTIE, DO;  Location: [facility] ENDOSCOPY;  Service: Pulmonary;;   BRONCHIAL WASHINGS  03/15/2020   Procedure: BRONCHIAL WASHINGS;  Surgeon: Brenna Adine LITTIE, DO;  Location: [facility] ENDOSCOPY;  Service: Pulmonary;;   CHOLECYSTECTOMY     COLONOSCOPY  02/07/2010  COLONOSCOPY WITH PROPOFOL  N/A 02/15/2020   Procedure: COLONOSCOPY WITH PROPOFOL ;  Surgeon: Burnette Fallow, MD;  Location: [facility] ENDOSCOPY;  Service: Endoscopy;  Laterality: N/A;   DILATION AND CURETTAGE OF UTERUS     DILATION AND CURETTAGE OF UTERUS N/A 10/19/2023   Procedure: DILATION AND CURETTAGE;  Surgeon: Jeralyn Crutch, MD;  Location: [facility] OR;  Service: Gynecology;  Laterality: N/A;  ANESTHESIA REQUEST TO BE UNDER IV CONSCIOUS SEDATION LIKE FOR A COLONOSCOPY IF POSSIBLE   ENDOSCOPIC MUCOSAL RESECTION N/A 06/14/2020   Procedure: ENDOSCOPIC MUCOSAL RESECTION;  Surgeon: Wilhelmenia Aloha Raddle., MD;  Location:  [facility] ENDOSCOPY;  Service: Gastroenterology;  Laterality: N/A;   ESOPHAGOGASTRODUODENOSCOPY (EGD) WITH PROPOFOL  N/A 02/15/2020   Procedure: ESOPHAGOGASTRODUODENOSCOPY (EGD) WITH PROPOFOL ;  Surgeon: Burnette Fallow, MD;  Location: [facility] ENDOSCOPY;  Service: Endoscopy;  Laterality: N/A;   ESOPHAGOGASTRODUODENOSCOPY (EGD) WITH PROPOFOL  N/A 06/14/2020   Procedure: ESOPHAGOGASTRODUODENOSCOPY (EGD) WITH PROPOFOL ;  Surgeon: Wilhelmenia Aloha Raddle., MD;  Location: [facility] ENDOSCOPY;  Service: Gastroenterology;  Laterality: N/A;   ESOPHAGOGASTRODUODENOSCOPY (EGD) WITH PROPOFOL  Left 03/02/2023   Procedure: ESOPHAGOGASTRODUODENOSCOPY (EGD) WITH PROPOFOL ;  Surgeon: Saintclair Jasper, MD;  Location: [facility] ENDOSCOPY;  Service: Gastroenterology;  Laterality: Left;   FOREIGN BODY REMOVAL  06/14/2020   Procedure: FOREIGN BODY REMOVAL;  Surgeon: Wilhelmenia Aloha Raddle., MD;  Location: [facility] ENDOSCOPY;  Service: Gastroenterology;;   HYSTEROSCOPY     LAPAROSCOPIC APPENDECTOMY N/A 09/23/2015   Procedure: APPENDECTOMY LAPAROSCOPIC;  Surgeon: Dann Hummer, MD;  Location: [facility] OR;  Service: General;  Laterality: N/A;   MYOMECTOMY  1990, 1997   x 2 - In 1997, noted to have extensive pelvic adhesions and BL tubal obstruction   POLYPECTOMY  02/15/2020   Procedure: POLYPECTOMY;  Surgeon: Burnette Fallow, MD;  Location: [facility] ENDOSCOPY;  Service: Endoscopy;;   TONSILLECTOMY     TRANSTHORACIC ECHOCARDIOGRAM  06/2014    EF 55-60%.  No RWMA. Gr 1 DD. Cannot R/o PFO (consider bubble study).   UPPER ESOPHAGEAL ENDOSCOPIC ULTRASOUND (EUS) N/A 06/14/2020   Procedure: UPPER ESOPHAGEAL ENDOSCOPIC ULTRASOUND (EUS);  Surgeon: Wilhelmenia Aloha Raddle., MD;  Location: [facility] ENDOSCOPY;  Service: Gastroenterology;  Laterality: N/A;   UPPER GASTROINTESTINAL ENDOSCOPY  12/18/2009   VIDEO BRONCHOSCOPY WITH ENDOBRONCHIAL NAVIGATION N/A 03/15/2020   Procedure: VIDEO BRONCHOSCOPY WITH ENDOBRONCHIAL NAVIGATION;  Surgeon: Brenna Adine CROME, DO;  Location: [facility] ENDOSCOPY;  Service:  Pulmonary;  Laterality: N/A;   VIDEO BRONCHOSCOPY WITH ENDOBRONCHIAL ULTRASOUND  03/15/2020   Procedure: VIDEO BRONCHOSCOPY WITH ENDOBRONCHIAL ULTRASOUND;  Surgeon: Brenna Adine CROME, DO;  Location: [facility] ENDOSCOPY;  Service: Pulmonary;;   WISDOM TOOTH EXTRACTION      Allergies  Allergies  Allergen Reactions   Amlodipine      Cramps    Codeine     REACTION: headaches   Gabapentin      headache   Prozac [Fluoxetine Hcl] Other (See Comments)    Suicidal ideations     Labs/Other Studies Reviewed    The following studies were reviewed today:  Cardiac Studies & Procedures   ______________________________________________________________________________________________          CT SCANS  CT CORONARY MORPH W/CTA COR W/SCORE 08/29/2022  Addendum 09/03/2022 10:57 PM ADDENDUM REPORT: 09/03/2022 22:55  EXAM: OVER-READ INTERPRETATION  CT CHEST  The following report is an over-read performed by radiologist Dr. Leita [facility] on 09/03/2022. This over-read does not include interpretation of cardiac or coronary anatomy or pathology. The cardiac/coronary CTA interpretation by the cardiologist is attached.  COMPARISON:  09/25/2021.  FINDINGS: Heart is borderline enlarged and there  is no pericardial effusion. Three-vessel coronary artery calcifications are noted. There is mild atherosclerotic calcification of the aorta without evidence of aneurysm. The pulmonary trunk is normal in caliber.  No mediastinal lymphadenopathy. The visualized portion of the esophagus is within normal limits.  Mild centrilobular emphysematous changes are present in the lungs. Atelectasis or scarring is noted at the lung bases. No effusion or pneumothorax.  Fatty infiltration of the liver is noted.  No acute abnormality.  Degenerative changes are noted in the thoracic spine. No acute osseous abnormality.  IMPRESSION: 1. Emphysema. 2. Coronary artery calcifications. 3.  Aortic atherosclerosis. 4. Hepatic steatosis.   Electronically Signed By: Leita Birmingham M.D. On: 09/03/2022 22:55  Narrative CLINICAL DATA:  68 year old with chest pain  EXAM: Cardiac/Coronary  CTA  TECHNIQUE: The patient was scanned on a [employer].  FINDINGS: A 120 kV prospective scan was triggered in the descending thoracic aorta at 111 HU's. Axial non-contrast 3 mm slices were carried out through the heart. The data set was analyzed on a dedicated work station and scored using the Agatson method. Gantry rotation speed was 250 msecs and collimation was .6 mm. 0.8 mg of sl NTG was given. The 3D data set was reconstructed in 5% intervals of the 67-82 % of the R-R cycle. Diastolic phases were analyzed on a dedicated work station using MPR, MIP and VRT modes. The patient received 80 cc of contrast.  Image quality: good  Aorta:  Normal size.  No calcifications.  No dissection.  Aortic Valve:  Trileaflet.  No calcifications.  Coronary Arteries:  Normal coronary origin.  Right dominance.  RCA is a large dominant artery that gives rise to PDA and PLA. There is dense proximal/mid mixed plaque 70-99% stenosis. FFR 0.98 to 0.83 mid, and 0.83 to 0.65 mid to distal (abnormal).  Left main is a large artery that gives rise to LAD and LCX arteries.  LAD is a large vessel that has dense mixed plaque proximal/mid with 30-49% stenosis and tapering of mid vessel. No FFR modeling of mid to distal vessel.  D1-small caliber bifurcating vessel with calcified plaque.  LCX is a non-dominant artery. There is diffuse mixed plaque, mild stenosis.  OM1-moderate sized vessel.  No stenosis.  Other findings:  Normal pulmonary vein drainage into the left atrium.  Normal left atrial appendage without a thrombus.  Normal size of the pulmonary artery.  Please see radiology report for non cardiac findings.  IMPRESSION: 1. Coronary calcium  score of 2262. This was 31 percentile  for age and sex matched control.  2. Total plaque volume (TPV) 1356 mm3 which is 97 percentile for age-and sex matched controls (calcified plaque 526 mm3; non-calcified plaque 830 mm3). TPV is extensive.  3. Normal coronary origin with right dominance.  4. Severe CAD of mid RCA (FFR abnormal), mild proximal LAD and CX. Possible significant disease of mid LAD to distal (not modeled by FFR).  Electronically Signed: By: Oneil Parchment M.D. On: 08/29/2022 14:58     ______________________________________________________________________________________________     Recent Labs: 02/26/2023: Pro B Natriuretic peptide (BNP) 234.0 02/28/2023: ALT 13 10/19/2023: BUN 10; Creatinine, Ser 0.78; Hemoglobin 12.9; Platelets 213; Potassium 3.4; Sodium 140  Recent Lipid Panel    Component Value Date/Time   CHOL 99 (L) 01/13/2023 0831   TRIG 131 01/13/2023 0831   HDL 45 01/13/2023 0831   CHOLHDL 2.2 01/13/2023 0831   CHOLHDL 2.6 09/28/2019 1211   VLDL 26 09/19/2016 1512   LDLCALC 31 01/13/2023  0831   LDLCALC 56 09/28/2019 1211   LDLDIRECT 104 (H) 06/12/2010 1641    History of Present Illness    68 year old female with the above past medical history including CAD, hypertension, hyperlipidemia, hypothyroidism, type 2 diabetes, CVA, OSA, COPD and GERD.   She has a history of COPD, former tobacco use, follows with pulmonology.  She has a strong family history of CAD. She contacted our office on 08/18/2022 and noted that she had an echocardiogram with her PCP who recommended follow-up with cardiology based on the results.  Echocardiogram ultimately showed EF 60 to 65%, normal LV systolic function, mild LVH, G1 DD, no significant valvular abnormalities. Coronary CT angiogram in 08/2022 revealed coronary calcium  score 2262 (99th percentile), extensive TPV, severe CAD of the mid RCA with abnormal FFR, mild proximal LAD and left circumflex stenoses, possible significant disease of the mid LAD to distal LAD  (not modeled by FFR). Findings were discussed with Dr. Anner- in the absence of symptoms initial trial of medical therapy was advised.  She was started on Imdur .  Aspirin , high-dose statin, and beta-blocker were also advised.  Given statin intolerance, she was referred to lipid clinic Pharm.D. for consideration of PCSK9 inhibitor.  It was noted that should she fail medical therapy, cardiac catheterization could be advised.   She was hospitalized in November 2024 in the setting of severe symptomatic anemia, hemoglobin 6.4  Endoscopy showed Barrett's esophagus.  She was last seen in the office on 10/07/2023 and was stable overall from a cardiac standpoint.  She reported mild intermittent chest discomfort with significant exertion, stable dyspnea on exertion, intermittent palpitations. Imdur  was increased to 90 mg daily.  It was again noted that should she have progressive symptoms concerning for angina, cardiac catheterization could be considered. She underwent D&C for abnormal uterine bleeding in 10/2023.  She presents today for follow-up.  Since her last visit she has been stable overall from a cardiac standpoint.  She continues to note daily chest pain, she reports that the pain is so severe at times it brings tears to my eyes.  She has stable chronic dyspnea, unchanged from prior visits. She reports an elevated heart rate over the past month, HR in the 110s bpm.  She reports occasional palpitations.  She denies bleeding.  Home Medications    Current Outpatient Medications  Medication Sig Dispense Refill   albuterol  (PROAIR  HFA) 108 (90 Base) MCG/ACT inhaler INHALE 2 PUFFS EVERY 6  HOURS AS NEEDED FOR  SHORTNESS OF BREATH OR  WHEEZING 34 g 3   aspirin  EC 81 MG tablet Take 1 tablet (81 mg total) by mouth daily. Swallow whole. 30 tablet 0   Aspirin -Acetaminophen -Caffeine (GOODY HEADACHE PO) Take 1 Package by mouth daily as needed (headache).     bisoprolol  (ZEBETA ) 10 MG tablet Take 10 mg by mouth daily.      Budeson-Glycopyrrol-Formoterol  (BREZTRI  AEROSPHERE) 160-9-4.8 MCG/ACT AERO Inhale 2 puffs into the lungs in the morning and at bedtime. 3 each 3   calcium  carbonate (TUMS - DOSED IN MG ELEMENTAL CALCIUM ) 500 MG chewable tablet Chew 1 tablet by mouth daily as needed for indigestion or heartburn.     diphenhydrAMINE  (BENADRYL ) 25 MG tablet Take 50 mg by mouth at bedtime as needed for sleep.     docusate sodium  (COLACE) 100 MG capsule Take 300 mg by mouth daily.     estradiol  (ESTRACE  VAGINAL) 0.1 MG/GM vaginal cream Place 1 Applicatorful vaginally at bedtime. 42.5 g 12   ezetimibe  (ZETIA ) 10  MG tablet Take 10 mg by mouth daily.     fluticasone  (FLONASE ) 50 MCG/ACT nasal spray USE 2 SPRAYS IN BOTH NOSTRILS  DAILY 16 g 5   furosemide  (LASIX ) 40 MG tablet TAKE 1 TABLET BY MOUTH DAILY AS  NEEDED FOR WEIGHT GAIN OF 3 LB  OVER AT NIGHT OR 5 LB IN 1 WEEK 100 tablet 2   glucose blood (ONETOUCH VERIO) test strip Use to check blood sugar twice daily. Dx: E11.9 300 each 1   hydrALAZINE  (APRESOLINE ) 50 MG tablet Take 1 tablet (50 mg total) by mouth 3 (three) times daily. May take an additional 50 mg if needed for blood pressureif still elevated is greater than or equal to 160 systolic after 1 hour recheck of blood pressure     ibuprofen (ADVIL) 200 MG tablet Take 800 mg by mouth every 6 (six) hours as needed for moderate pain (pain score 4-6) or mild pain (pain score 1-3).     ipratropium-albuterol  (DUONEB) 0.5-2.5 (3) MG/3ML SOLN Take 3 mLs by nebulization every 6 (six) hours as needed. 360 mL 6   isosorbide  mononitrate (IMDUR ) 30 MG 24 hr tablet Take 3 tablets (90 mg total) by mouth daily. 821 tablet 3   Lancets (ONETOUCH DELICA PLUS LANCET33G) MISC USE TO CHECK BLOOD SUGAR  TWICE DAILY 200 each 3   levothyroxine  (SYNTHROID ) 200 MCG tablet Take 200 mcg by mouth at bedtime.     losartan  (COZAAR ) 100 MG tablet TAKE 1 TABLET BY MOUTH  DAILY (Patient taking differently: Take 100 mg by mouth at bedtime.) 90  tablet 3   nitroGLYCERIN  (NITROSTAT ) 0.4 MG SL tablet Place 0.4 mg under the tongue every 5 (five) minutes as needed for chest pain.     omeprazole  (PRILOSEC) 40 MG capsule Take 40 mg by mouth daily.     OZEMPIC, 0.25 OR 0.5 MG/DOSE, 2 MG/3ML SOPN Inject 0.5 mg into the skin once a week.     pregabalin  (LYRICA ) 50 MG capsule Take 50 mg by mouth daily.     REPATHA  SURECLICK 140 MG/ML SOAJ INJECT 1 PEN SUBCUTANEOUSLY  EVERY 2 WEEKS 6 mL 3   Respiratory Therapy Supplies (NEBULIZER/TUBING/MOUTHPIECE) KIT 1 application by Does not apply route every 6 (six) hours as needed. FAX TO [phone] 1 kit 11   traMADol  (ULTRAM ) 50 MG tablet Take 50 mg by mouth daily as needed for moderate pain (pain score 4-6) or severe pain (pain score 7-10).     valACYclovir  (VALTREX ) 500 MG tablet TAKE 1 TABLET BY MOUTH  DAILY 90 tablet 3   Vibegron  75 MG TABS Take 1 tablet (75 mg total) by mouth daily. 90 tablet 3   Collagen-Vitamin C-Biotin (COLLAGEN PO) Take 1 tablet by mouth daily. peptide (Patient not taking: Reported on 12/08/2023)     misoprostol  (CYTOTEC ) 200 MCG tablet Place two tablets in the vagina the night prior to your next clinic appointment (Patient not taking: Reported on 12/08/2023) 2 tablet 0   Multiple Vitamins-Minerals (HAIR SKIN & NAILS) TABS Take 1 tablet by mouth daily. (Patient not taking: Reported on 12/08/2023)     pantoprazole  (PROTONIX ) 40 MG tablet Take 1 tablet (40 mg total) by mouth 2 (two) times daily. (Patient not taking: Reported on 12/08/2023) 60 tablet 1   No current facility-administered medications for this visit.     Review of Systems    She denies pnd, orthopnea, n, v, dizziness, syncope, edema, weight gain, or early satiety. All other systems reviewed and are otherwise negative except  as noted above.   Physical Exam    VS:  BP 128/88 (BP Location: Left Arm, Patient Position: Sitting, Cuff Size: Normal)   Pulse (!) 110   Ht 5' 3 (1.6 m)   Wt 176 lb 12.8 oz (80.2 kg)   LMP  02/27/2014   SpO2 95%   BMI 31.32 kg/m   GEN: Well nourished, well developed, in no acute distress. HEENT: normal. Neck: Supple, no JVD, carotid bruits, or masses. Cardiac: RRR, no murmurs, rubs, or gallops. No clubbing, cyanosis, edema.  Radials/DP/PT 2+ and equal bilaterally.  Respiratory:  Respirations regular and unlabored, clear to auscultation bilaterally. GI: Soft, nontender, nondistended, BS + x 4. MS: no deformity or atrophy. Skin: warm and dry, no rash. Neuro:  Strength and sensation are intact. Psych: Normal affect.  Accessory Clinical Findings    ECG personally reviewed by me today - EKG Interpretation Date/Time:  Tuesday December 08 2023 10:55:40 EDT Ventricular Rate:  114 PR Interval:  182 QRS Duration:  82 QT Interval:  320 QTC Calculation: 441 R Axis:   39  Text Interpretation: Sinus tachycardia When compared with ECG of 06-Oct-2023 11:21, No significant change was found Confirmed by Daneen Perkins ([id number]) on 12/08/2023 11:04:37 AM  - no acute changes.   Lab Results  Component Value Date   WBC 7.1 10/19/2023   HGB 12.9 10/19/2023   HCT 40.8 10/19/2023   MCV 99.0 10/19/2023   PLT 213 10/19/2023   Lab Results  Component Value Date   CREATININE 0.78 10/19/2023   BUN 10 10/19/2023   NA 140 10/19/2023   K 3.4 (L) 10/19/2023   CL 102 10/19/2023   CO2 26 10/19/2023   Lab Results  Component Value Date   ALT 13 02/28/2023   AST 19 02/28/2023   ALKPHOS 50 02/28/2023   BILITOT 0.3 02/28/2023   Lab Results  Component Value Date   CHOL 99 (L) 01/13/2023   HDL 45 01/13/2023   LDLCALC 31 01/13/2023   LDLDIRECT 104 (H) 06/12/2010   TRIG 131 01/13/2023   CHOLHDL 2.2 01/13/2023    Lab Results  Component Value Date   HGBA1C 6.9 (H) 02/28/2023    Assessment & Plan   1. CAD: Coronary CT angiogram in 08/2022 revealed coronary calcium  score 2262 (99th percentile), extensive TPV, severe CAD of the mid RCA with abnormal FFR, mild proximal LAD and left circumflex  stenoses, possible significant disease of the mid LAD to distal LAD (not mottled by FFR).  Findings were discussed with Dr. Anner and in the absence of symptoms, initial trial of medical therapy was advised. She continues to note daily chest pain despite increased Imdur . She reports that the pain is so severe at times it brings tears to my eyes. She has stable chronic dyspnea, unchanged from prior visits.  Reviewed with Dr. Anner, who recommends pursuing LHC.  Patient is agreeable.  LHC scheduled for 12/23/2023 with Dr. Anner.  Reviewed ED precautions.  Will check CBC, CMET.  Continue aspirin , bisoprolol , losartan , hydralazine , Imdur , Zetia , and Repatha .  Informed Consent   Shared Decision Making/Informed Consent The risks [stroke (1 in 1000), death (1 in 1000), kidney failure [usually temporary] (1 in 500), bleeding (1 in 200), allergic reaction [possibly serious] (1 in 200)], benefits (diagnostic support and management of coronary artery disease) and alternatives of a cardiac catheterization were discussed in detail with Ms. Gainer and she is willing to proceed.     2.  Palpitations/tachycardia: EKG shows sinus tachycardia.  She reports  an elevated heart rate for the past month, as well as intermittent palpitations. Will check 14-day ZIO monitor to rule out arrhythmia.  Will increase bisoprolol  to 15 mg daily.  Ischemic evaluation pending as above.  She is post D&C in 10/2023.  She denies bleeding.  CBC pending as above. Low suspicion for PE.   3. LVH/diastolic dysfunction: Echocardiogram in 07/2022 showed EF 60 to 65%, normal LV systolic function, mild LVH, G1 DD, no significant valvular abnormalities.  She has stable chronic dyspnea on exertion in the setting of COPD, generally euvolemic and well compensated on exam.  Continue bisoprolol  as above, Lasix  as needed.   4. Hypertension: BP well controlled. Continue current antihypertensive regimen.   5. Hyperlipidemia/statin intolerance: LDL was 31  in 01/2023.  Will repeat fasting lipid panel. Continue Repatha .    6. Type 2 diabetes: A1c was 6.9 in 02/2023. Monitored and managed per PCP.    7. History of CVA: No recurrence. Continue aspirin , Repatha .   8. OSA: Intermittent adherence to CPAP.  She is pending repeat home sleep study per pulmonology.  9. COPD: Stable intermittent dyspnea. Follows with pulmonology.   10. Anemia: S/p recent D&C for abnormal uterine bleeding.  Denies any recent bleeding.  Repeat CBC pending as above.   11. Disposition: Follow-up 2 weeks post cath.       Damien JAYSON Braver, NP 12/08/2023, 11:04 AM

## 2023-12-08 NOTE — Patient Instructions (Addendum)
 Medication Instructions:  Increase Bisoprolol  15 mg daily   *If you need a refill on your cardiac medications before your next appointment, please call your pharmacy*  Lab Work: CMET, CBC, Lipid panel today  Testing/Procedures: 1)Zio 14 day Monitor  2)Your physician has requested that you have a Left cardiac catheterization. Cardiac catheterization is used to diagnose and/or treat various heart conditions. Doctors may recommend this procedure for a number of different reasons. The most common reason is to evaluate chest pain. Chest pain can be a symptom of coronary artery disease (CAD), and cardiac catheterization can show whether plaque is narrowing or blocking your heart's arteries. This procedure is also used to evaluate the valves, as well as measure the blood flow and oxygen  levels in different parts of your heart. For further information please visit [email or web address] Please follow instruction sheet, as given.   Follow-Up: At [facility], you and your health needs are our priority.  As part of our continuing mission to provide you with exceptional heart care, our providers are all part of one team.  This team includes your primary Cardiologist (physician) and Advanced Practice Providers or APPs (Physician Assistants and Nurse Practitioners) who all work together to provide you with the care you need, when you need it.  Your next appointment:   2-3 week(s) post cath apt  Provider:   Alm Clay, MD or Damien Braver, NP          We recommend signing up for the patient portal called MyChart.  Sign up information is provided on this After Visit Summary.  MyChart is used to connect with patients for Virtual Visits (Telemedicine).  Patients are able to view lab/test results, encounter notes, upcoming appointments, etc.  Non-urgent messages can be sent to your provider as well.   To learn more about what you can do with MyChart, go to [email or web address].   Other  Instructions ZIO XT- Long Term Monitor Instructions  Your physician has requested you wear a ZIO patch monitor for 14 days.  This is a single patch monitor. [employer] supplies one patch monitor per enrollment. Additional stickers are not available. Please do not apply patch if you will be having a Nuclear Stress Test,  Echocardiogram, Cardiac CT, MRI, or Chest Xray during the period you would be wearing the  monitor. The patch cannot be worn during these tests. You cannot remove and re-apply the  ZIO XT patch monitor.  Your ZIO patch monitor will be mailed 3 day USPS to your address on file. It may take 3-5 days  to receive your monitor after you have been enrolled.  Once you have received your monitor, please review the enclosed instructions. Your monitor  has already been registered assigning a specific monitor serial # to you.  Billing and Patient Assistance Program Information  We have supplied [employer] with any of your insurance information on file for billing purposes. [employer] offers a sliding scale Patient Assistance Program for patients that do not have  insurance, or whose insurance does not completely cover the cost of the ZIO monitor.  You must apply for the Patient Assistance Program to qualify for this discounted rate.  To apply, please call [employer] at [phone], select option 4, select option 2, ask to apply for  Patient Assistance Program. Meredeth will ask your household income, and how many people  are in your household. They will quote your out-of-pocket cost based on that information.  [employer] will also be able to set up a 4-month, interest-free  payment plan if needed.  Applying the monitor   Shave hair from upper left chest.  Hold abrader disc by orange tab. Rub abrader in 40 strokes over the upper left chest as  indicated in your monitor instructions.  Clean area with 4 enclosed alcohol pads. Let dry.  Apply patch as indicated in monitor instructions. Patch will be  placed under collarbone on left  side of chest with arrow pointing upward.  Rub patch adhesive wings for 2 minutes. Remove white label marked 1. Remove the white  label marked 2. Rub patch adhesive wings for 2 additional minutes.  While looking in a mirror, press and release button in center of patch. A small green light will  flash 3-4 times. This will be your only indicator that the monitor has been turned on.  Do not shower for the first 24 hours. You may shower after the first 24 hours.  Press the button if you feel a symptom. You will hear a small click. Record Date, Time and  Symptom in the Patient Logbook.  When you are ready to remove the patch, follow instructions on the last 2 pages of Patient  Logbook. Stick patch monitor onto the last page of Patient Logbook.  Place Patient Logbook in the blue and white box. Use locking tab on box and tape box closed  securely. The blue and white box has prepaid postage on it. Please place it in the mailbox as  soon as possible. Your physician should have your test results approximately 7 days after the  monitor has been mailed back to [facility].  Call [facility] Customer Care at [phone] if you have questions regarding  your ZIO XT patch monitor. Call them immediately if you see an orange light blinking on your  monitor.  If your monitor falls off in less than 4 days, contact our Monitor department at [phone].  If your monitor becomes loose or falls off after 4 days call [employer] at [phone] for  suggestions on securing your monitor        [place] [facility] DEPT OF [place].  [facility] [address] A DEPT OF THE [place]. [facility] [address] [place] [address] Dept: [phone] Loc: [phone]  RONESHA HEENAN  12/08/2023  You are scheduled for a Cardiac Catheterization on Tuesday, September 10 with Dr. Alm Clay.  1. Please arrive at the [facility] (Main  Entrance A) at [facility]: [address] [place], [place] [address] at 8:00 AM (This time is 2 hour(s) before your procedure to ensure your preparation).   Free valet parking service is available. You will check in at ADMITTING. The support person will be asked to wait in the waiting room.  It is OK to have someone drop you off and come back when you are ready to be discharged.    Special note: Every effort is made to have your procedure done on time. Please understand that emergencies sometimes delay scheduled procedures.  2. Diet: NPO: Nothing to eat OR drink after midnight. (For TEE and Cath the same day)   3. Hydration: You need to be well hydrated before your procedure. On September 10, you may drink approved liquids (see below) until 2 hours before the procedure, with 16 oz of water as your last intake.   List of approved liquids water, clear juice, clear tea, black coffee, fruit juices, non-citric and without pulp, carbonated beverages, Gatorade, Kool -Aid, plain Jello-O  and plain ice popsicles.  4. Labs: You will need to have blood drawn on Tuesday, August 26 at [facility] (1st Floor), [address] [place], [place] [address]. You do not need to be fasting.  5. Medication instructions in preparation for your procedure:     Hold your Ozempic for 1 week prior to this cath.  Take your last dose on 8/31.  You may resume after this procedure. Hold your morning medications, EXCEPT MAKE SURE TO TAKE YOUR ASPIRIN .  You may take your Omeprazole  if you wish.    Contrast Allergy: No   On the morning of your procedure, take your Aspirin  81 mg and any morning medicines NOT listed above.  You may use sips of water.  6. Plan to go home the same day, you will only stay overnight if medically necessary. 7. Bring a current list of your medications and current insurance cards. 8. You MUST have a responsible person to drive you home. 9.  Someone MUST be with you the first 24 hours after you arrive home or your discharge will be delayed. 10. Please wear clothes that are easy to get on and off and wear slip-on shoes.  Thank you for allowing us  to care for you!   -- [facility]

## 2023-12-08 NOTE — Progress Notes (Unsigned)
Enrolled patient for a 14 day Zio XT monitor to be mailed to patients home  Grace [name] to read

## 2023-12-08 NOTE — Progress Notes (Unsigned)
 Office Visit    Patient Name: Grace [name] Date of Encounter: 12/08/2023  Primary Care Provider:  Delores Rojelio Caldron, NP Primary Cardiologist:  Alm Clay, MD  Chief Complaint    68 year old female with a history of CAD, hypertension, hyperlipidemia, hypothyroidism, type 2 diabetes, CVA, OSA, COPD and GERD who presents for follow-up related to CAD.   Past Medical History    Past Medical History:  Diagnosis Date   Abnormal vaginal bleeding    Anxiety    Asthma    Barrett's esophagus    with high grade dysplasia per endoscopy 05/2005 // followed by Dr. Lupita Commander ([facility])   Chronic back pain    Complication of anesthesia    per patient she went in respiratory failure in recovery room in 2017   COPD (chronic obstructive pulmonary disease) (HCC)    Diabetes mellitus without complication (HCC)    type 2   Duodenal ulcer disease    thought to be contributed at least partly by overuse of NSAIDs and salicylates (goody powdr)   Endometrial polyp 03/2003   s/p resection in 03/2003. 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   Migraine    Nevus    Personal history of failed conscious sedation 08/11/2011   Sleep apnea    does not use cpap   Stroke (HCC) 10/2000   Tachycardia    Thyroid  disease    Tobacco abuse 04/2012    quit   Past Surgical History:  Procedure Laterality Date   ADENOIDECTOMY     APPENDECTOMY  2017   BALLOON DILATION N/A 06/14/2020   Procedure: BALLOON DILATION;  Surgeon: Wilhelmenia Aloha Raddle., MD;  Location: [facility] ENDOSCOPY;  Service: Gastroenterology;  Laterality: N/A;   BIOPSY  02/15/2020   Procedure: BIOPSY;  Surgeon: Burnette Fallow, MD;  Location: [facility] ENDOSCOPY;  Service: Endoscopy;;   BIOPSY  06/14/2020   Procedure: BIOPSY;  Surgeon: Wilhelmenia Aloha Raddle., MD;  Location: [facility] ENDOSCOPY;  Service: Gastroenterology;;   BIOPSY  03/02/2023   Procedure: BIOPSY;  Surgeon: Saintclair Jasper, MD;  Location: [facility] ENDOSCOPY;  Service: Gastroenterology;;   BRONCHIAL BIOPSY  03/15/2020   Procedure: BRONCHIAL BIOPSIES;  Surgeon: Brenna Adine LITTIE, DO;  Location: [facility] ENDOSCOPY;  Service: Pulmonary;;   BRONCHIAL BRUSHINGS  03/15/2020   Procedure: BRONCHIAL BRUSHINGS;  Surgeon: Brenna Adine LITTIE, DO;  Location: [facility] ENDOSCOPY;  Service: Pulmonary;;   BRONCHIAL NEEDLE ASPIRATION BIOPSY  03/15/2020   Procedure: BRONCHIAL NEEDLE ASPIRATION BIOPSIES;  Surgeon: Brenna Adine LITTIE, DO;  Location: [facility] ENDOSCOPY;  Service: Pulmonary;;   BRONCHIAL WASHINGS  03/15/2020   Procedure: BRONCHIAL WASHINGS;  Surgeon: Brenna Adine LITTIE, DO;  Location: [facility] ENDOSCOPY;  Service: Pulmonary;;   CHOLECYSTECTOMY     COLONOSCOPY  02/07/2010  COLONOSCOPY WITH PROPOFOL  N/A 02/15/2020   Procedure: COLONOSCOPY WITH PROPOFOL ;  Surgeon: Burnette Fallow, MD;  Location: [facility] ENDOSCOPY;  Service: Endoscopy;  Laterality: N/A;   DILATION AND CURETTAGE OF UTERUS     DILATION AND CURETTAGE OF UTERUS N/A 10/19/2023   Procedure: DILATION AND CURETTAGE;  Surgeon: Jeralyn Crutch, MD;  Location: [facility] OR;  Service: Gynecology;  Laterality: N/A;  ANESTHESIA REQUEST TO BE UNDER IV CONSCIOUS SEDATION LIKE FOR A COLONOSCOPY IF POSSIBLE   ENDOSCOPIC MUCOSAL RESECTION N/A 06/14/2020   Procedure: ENDOSCOPIC MUCOSAL RESECTION;  Surgeon: Wilhelmenia Aloha Raddle., MD;  Location:  [facility] ENDOSCOPY;  Service: Gastroenterology;  Laterality: N/A;   ESOPHAGOGASTRODUODENOSCOPY (EGD) WITH PROPOFOL  N/A 02/15/2020   Procedure: ESOPHAGOGASTRODUODENOSCOPY (EGD) WITH PROPOFOL ;  Surgeon: Burnette Fallow, MD;  Location: [facility] ENDOSCOPY;  Service: Endoscopy;  Laterality: N/A;   ESOPHAGOGASTRODUODENOSCOPY (EGD) WITH PROPOFOL  N/A 06/14/2020   Procedure: ESOPHAGOGASTRODUODENOSCOPY (EGD) WITH PROPOFOL ;  Surgeon: Wilhelmenia Aloha Raddle., MD;  Location[facility] END[facility]COPY;  Service: Gastroenterology;  Laterality: N/A;   ESOPHAGOGASTRODUODENOSCOPY (EGD) WITH PROPOFOL  Left 03/02/2023   Procedure: ESOPHAGOGASTRODUODENOSCOPY (EGD) WITH PROPOFOL ;  Surgeon: Saintclair Jasper, MD;  Location: [facility] ENDOSCOPY;  Service: Gastroenterology;  Laterality: Left;   FOREIGN BODY REMOVAL  06/14/2020   Procedure: FOREIGN BODY REMOVAL;  Surgeon: Wilhelmenia Aloha Raddle., MD;  Location: [facility] ENDOSCOPY;  Service: Gastroenterology;;   HYSTEROSCOPY     LAPAROSCOPIC APPENDECTOMY N/A 09/23/2015   Procedure: APPENDECTOMY LAPAROSCOPIC;  Surgeon: Dann Hummer, MD;  Location: [facility] OR;  Service: General;  Laterality: N/A;   MYOMECTOMY  1990, 1997   x 2 - In 1997, noted to have extensive pelvic adhesions and BL tubal obstruction   POLYPECTOMY  02/15/2020   Procedure: POLYPECTOMY;  Surgeon: Burnette Fallow, MD;  Location: [facility] ENDOSCOPY;  Service: Endoscopy;;   TONSILLECTOMY     TRANSTHORACIC ECHOCARDIOGRAM  06/2014    EF 55-60%.  No RWMA. Gr 1 DD. Cannot R/o PFO (consider bubble study).   UPPER ESOPHAGEAL ENDOSCOPIC ULTRASOUND (EUS) N/A 06/14/2020   Procedure: UPPER ESOPHAGEAL ENDOSCOPIC ULTRASOUND (EUS);  Surgeon: Wilhelmenia Aloha Raddle., MD;  Location: [facility] ENDOSCOPY;  Service: Gastroenterology;  Laterality: N/A;   UPPER GASTROINTESTINAL ENDOSCOPY  12/18/2009   VIDEO BRONCHOSCOPY WITH ENDOBRONCHIAL NAVIGATION N/A 03/15/2020   Procedure: VIDEO BRONCHOSCOPY WITH ENDOBRONCHIAL NAVIGATION;  Surgeon: Brenna Adine CROME, DO;  Location: [facility] ENDOSCOPY;  Service:  Pulmonary;  Laterality: N/A;   VIDEO BRONCHOSCOPY WITH ENDOBRONCHIAL ULTRASOUND  03/15/2020   Procedure: VIDEO BRONCHOSCOPY WITH ENDOBRONCHIAL ULTRASOUND;  Surgeon: Brenna Adine CROME, DO;  Location: [facility] ENDOSCOPY;  Service: Pulmonary;;   WISDOM TOOTH EXTRACTION      Allergies  Allergies  Allergen Reactions   Amlodipine      Cramps    Codeine     REACTION: headaches   Gabapentin      headache   Prozac [Fluoxetine Hcl] Other (See Comments)    Suicidal ideations     Labs/Other Studies Reviewed    The following studies were reviewed today:  Cardiac Studies & Procedures   ______________________________________________________________________________________________          CT SCANS  CT CORONARY MORPH W/CTA COR W/SCORE 08/29/2022  Addendum 09/03/2022 10:57 PM ADDENDUM REPORT: 09/03/2022 22:55  EXAM: OVER-READ INTERPRETATION  CT CHEST  The following report is an over-read performed by radiologist Dr. Leita Sandy [facility] on 09/03/2022. This over-read does not include interpretation of cardiac or coronary anatomy or pathology. The cardiac/coronary CTA interpretation by the cardiologist is attached.  COMPARISON:  09/25/2021.  FINDINGS: Heart is borderline enlarged and there  is no pericardial effusion. Three-vessel coronary artery calcifications are noted. There is mild atherosclerotic calcification of the aorta without evidence of aneurysm. The pulmonary trunk is normal in caliber.  No mediastinal lymphadenopathy. The visualized portion of the esophagus is within normal limits.  Mild centrilobular emphysematous changes are present in the lungs. Atelectasis or scarring is noted at the lung bases. No effusion or pneumothorax.  Fatty infiltration of the liver is noted.  No acute abnormality.  Degenerative changes are noted in the thoracic spine. No acute osseous abnormality.  IMPRESSION: 1. Emphysema. 2. Coronary artery calcifications. 3.  Aortic atherosclerosis. 4. Hepatic steatosis.   Electronically Signed By: Leita Birmingham M.D. On: 09/03/2022 22:55  Narrative CLINICAL DATA:  68 year old with chest pain  EXAM: Cardiac/Coronary  CTA  TECHNIQUE: The patient was scanned on a [employer].  FINDINGS: A 120 kV prospective scan was triggered in the descending thoracic aorta at 111 HU's. Axial non-contrast 3 mm slices were carried out through the heart. The data set was analyzed on a dedicated work station and scored using the Agatson method. Gantry rotation speed was 250 msecs and collimation was .6 mm. 0.8 mg of sl NTG was given. The 3D data set was reconstructed in 5% intervals of the 67-82 % of the R-R cycle. Diastolic phases were analyzed on a dedicated work station using MPR, MIP and VRT modes. The patient received 80 cc of contrast.  Image quality: good  Aorta:  Normal size.  No calcifications.  No dissection.  Aortic Valve:  Trileaflet.  No calcifications.  Coronary Arteries:  Normal coronary origin.  Right dominance.  RCA is a large dominant artery that gives rise to PDA and PLA. There is dense proximal/mid mixed plaque 70-99% stenosis. FFR 0.98 to 0.83 mid, and 0.83 to 0.65 mid to distal (abnormal).  Left main is a large artery that gives rise to LAD and LCX arteries.  LAD is a large vessel that has dense mixed plaque proximal/mid with 30-49% stenosis and tapering of mid vessel. No FFR modeling of mid to distal vessel.  D1-small caliber bifurcating vessel with calcified plaque.  LCX is a non-dominant artery. There is diffuse mixed plaque, mild stenosis.  OM1-moderate sized vessel.  No stenosis.  Other findings:  Normal pulmonary vein drainage into the left atrium.  Normal left atrial appendage without a thrombus.  Normal size of the pulmonary artery.  Please see radiology report for non cardiac findings.  IMPRESSION: 1. Coronary calcium  score of 2262. This was 50 percentile  for age and sex matched control.  2. Total plaque volume (TPV) 1356 mm3 which is 97 percentile for age-and sex matched controls (calcified plaque 526 mm3; non-calcified plaque 830 mm3). TPV is extensive.  3. Normal coronary origin with right dominance.  4. Severe CAD of mid RCA (FFR abnormal), mild proximal LAD and CX. Possible significant disease of mid LAD to distal (not modeled by FFR).  Electronically Signed: By: Oneil Parchment M.D. On: 08/29/2022 14:58     ______________________________________________________________________________________________     Recent Labs: 02/26/2023: Pro B Natriuretic peptide (BNP) 234.0 02/28/2023: ALT 13 10/19/2023: BUN 10; Creatinine, Ser 0.78; Hemoglobin 12.9; Platelets 213; Potassium 3.4; Sodium 140  Recent Lipid Panel    Component Value Date/Time   CHOL 99 (L) 01/13/2023 0831   TRIG 131 01/13/2023 0831   HDL 45 01/13/2023 0831   CHOLHDL 2.2 01/13/2023 0831   CHOLHDL 2.6 09/28/2019 1211   VLDL 26 09/19/2016 1512   LDLCALC 31 01/13/2023  0831   LDLCALC 56 09/28/2019 1211   LDLDIRECT 104 (H) 06/12/2010 1641    History of Present Illness    68 year old female with the above past medical history including CAD, hypertension, hyperlipidemia, hypothyroidism, type 2 diabetes, CVA, OSA, COPD and GERD.   She has a history of COPD, former tobacco use, follows with pulmonology.  She has a strong family history of CAD. She contacted our office on 08/18/2022 and noted that she had an echocardiogram with her PCP who recommended follow-up with cardiology based on the results.  Echocardiogram ultimately showed EF 60 to 65%, normal LV systolic function, mild LVH, G1 DD, no significant valvular abnormalities. Coronary CT angiogram in 08/2022 revealed coronary calcium  score 2262 (99th percentile), extensive TPV, severe CAD of the mid RCA with abnormal FFR, mild proximal LAD and left circumflex stenoses, possible significant disease of the mid LAD to distal LAD  (not modeled by FFR). Findings were discussed with Dr. Anner- in the absence of symptoms initial trial of medical therapy was advised.  She was started on Imdur .  Aspirin , high-dose statin, and beta-blocker were also advised.  Given statin intolerance, she was referred to lipid clinic Pharm.D. for consideration of PCSK9 inhibitor.  It was noted that should she fail medical therapy, cardiac catheterization could be advised.   She was hospitalized in November 2024 in the setting of severe symptomatic anemia, hemoglobin 6.4  Endoscopy showed Barrett's esophagus.  She was last seen in the office on 10/07/2023 and was stable overall from a cardiac standpoint.  She reported mild intermittent chest discomfort with significant exertion, stable dyspnea on exertion, intermittent palpitations. Imdur  was increased to 90 mg daily.  It was again noted that should she have progressive symptoms concerning for angina, cardiac catheterization could be considered. She underwent D&C for abnormal uterine bleeding in 10/2023.  She presents today for follow-up.  Since her last visit she has been stable overall from a cardiac standpoint.  She continues to note daily chest pain, she reports that the pain is so severe at times it brings tears to my eyes.  She has stable chronic dyspnea, unchanged from prior visits. She reports an elevated heart rate over the past month, HR in the 110s bpm.  She reports occasional palpitations.  She denies bleeding.  Home Medications    Current Outpatient Medications  Medication Sig Dispense Refill   albuterol  (PROAIR  HFA) 108 (90 Base) MCG/ACT inhaler INHALE 2 PUFFS EVERY 6  HOURS AS NEEDED FOR  SHORTNESS OF BREATH OR  WHEEZING 34 g 3   aspirin  EC 81 MG tablet Take 1 tablet (81 mg total) by mouth daily. Swallow whole. 30 tablet 0   Aspirin -Acetaminophen -Caffeine (GOODY HEADACHE PO) Take 1 Package by mouth daily as needed (headache).     bisoprolol  (ZEBETA ) 10 MG tablet Take 10 mg by mouth daily.      Budeson-Glycopyrrol-Formoterol  (BREZTRI  AEROSPHERE) 160-9-4.8 MCG/ACT AERO Inhale 2 puffs into the lungs in the morning and at bedtime. 3 each 3   calcium  carbonate (TUMS - DOSED IN MG ELEMENTAL CALCIUM ) 500 MG chewable tablet Chew 1 tablet by mouth daily as needed for indigestion or heartburn.     diphenhydrAMINE  (BENADRYL ) 25 MG tablet Take 50 mg by mouth at bedtime as needed for sleep.     docusate sodium  (COLACE) 100 MG capsule Take 300 mg by mouth daily.     estradiol  (ESTRACE  VAGINAL) 0.1 MG/GM vaginal cream Place 1 Applicatorful vaginally at bedtime. 42.5 g 12   ezetimibe  (ZETIA ) 10  MG tablet Take 10 mg by mouth daily.     fluticasone  (FLONASE ) 50 MCG/ACT nasal spray USE 2 SPRAYS IN BOTH NOSTRILS  DAILY 16 g 5   furosemide  (LASIX ) 40 MG tablet TAKE 1 TABLET BY MOUTH DAILY AS  NEEDED FOR WEIGHT GAIN OF 3 LB  OVER AT NIGHT OR 5 LB IN 1 WEEK 100 tablet 2   glucose blood (ONETOUCH VERIO) test strip Use to check blood sugar twice daily. Dx: E11.9 300 each 1   hydrALAZINE  (APRESOLINE ) 50 MG tablet Take 1 tablet (50 mg total) by mouth 3 (three) times daily. May take an additional 50 mg if needed for blood pressureif still elevated is greater than or equal to 160 systolic after 1 hour recheck of blood pressure     ibuprofen (ADVIL) 200 MG tablet Take 800 mg by mouth every 6 (six) hours as needed for moderate pain (pain score 4-6) or mild pain (pain score 1-3).     ipratropium-albuterol  (DUONEB) 0.5-2.5 (3) MG/3ML SOLN Take 3 mLs by nebulization every 6 (six) hours as needed. 360 mL 6   isosorbide  mononitrate (IMDUR ) 30 MG 24 hr tablet Take 3 tablets (90 mg total) by mouth daily. 821 tablet 3   Lancets (ONETOUCH DELICA PLUS LANCET33G) MISC USE TO CHECK BLOOD SUGAR  TWICE DAILY 200 each 3   levothyroxine  (SYNTHROID ) 200 MCG tablet Take 200 mcg by mouth at bedtime.     losartan  (COZAAR ) 100 MG tablet TAKE 1 TABLET BY MOUTH  DAILY (Patient taking differently: Take 100 mg by mouth at bedtime.) 90  tablet 3   nitroGLYCERIN  (NITROSTAT ) 0.4 MG SL tablet Place 0.4 mg under the tongue every 5 (five) minutes as needed for chest pain.     omeprazole  (PRILOSEC) 40 MG capsule Take 40 mg by mouth daily.     OZEMPIC, 0.25 OR 0.5 MG/DOSE, 2 MG/3ML SOPN Inject 0.5 mg into the skin once a week.     pregabalin  (LYRICA ) 50 MG capsule Take 50 mg by mouth daily.     REPATHA  SURECLICK 140 MG/ML SOAJ INJECT 1 PEN SUBCUTANEOUSLY  EVERY 2 WEEKS 6 mL 3   Respiratory Therapy Supplies (NEBULIZER/TUBING/MOUTHPIECE) KIT 1 application by Does not apply route every 6 (six) hours as needed. FAX TO [phone] 1 kit 11   traMADol  (ULTRAM ) 50 MG tablet Take 50 mg by mouth daily as needed for moderate pain (pain score 4-6) or severe pain (pain score 7-10).     valACYclovir  (VALTREX ) 500 MG tablet TAKE 1 TABLET BY MOUTH  DAILY 90 tablet 3   Vibegron  75 MG TABS Take 1 tablet (75 mg total) by mouth daily. 90 tablet 3   Collagen-Vitamin C-Biotin (COLLAGEN PO) Take 1 tablet by mouth daily. peptide (Patient not taking: Reported on 12/08/2023)     misoprostol  (CYTOTEC ) 200 MCG tablet Place two tablets in the vagina the night prior to your next clinic appointment (Patient not taking: Reported on 12/08/2023) 2 tablet 0   Multiple Vitamins-Minerals (HAIR SKIN & NAILS) TABS Take 1 tablet by mouth daily. (Patient not taking: Reported on 12/08/2023)     pantoprazole  (PROTONIX ) 40 MG tablet Take 1 tablet (40 mg total) by mouth 2 (two) times daily. (Patient not taking: Reported on 12/08/2023) 60 tablet 1   No current facility-administered medications for this visit.     Review of Systems    She denies pnd, orthopnea, n, v, dizziness, syncope, edema, weight gain, or early satiety. All other systems reviewed and are otherwise negative except  as noted above.   Physical Exam    VS:  BP 128/88 (BP Location: Left Arm, Patient Position: Sitting, Cuff Size: Normal)   Pulse (!) 110   Ht 5' 3 (1.6 m)   Wt 176 lb 12.8 oz (80.2 kg)   LMP  02/27/2014   SpO2 95%   BMI 31.32 kg/m   GEN: Well nourished, well developed, in no acute distress. HEENT: normal. Neck: Supple, no JVD, carotid bruits, or masses. Cardiac: RRR, no murmurs, rubs, or gallops. No clubbing, cyanosis, edema.  Radials/DP/PT 2+ and equal bilaterally.  Respiratory:  Respirations regular and unlabored, clear to auscultation bilaterally. GI: Soft, nontender, nondistended, BS + x 4. MS: no deformity or atrophy. Skin: warm and dry, no rash. Neuro:  Strength and sensation are intact. Psych: Normal affect.  Accessory Clinical Findings    ECG personally reviewed by me today - EKG Interpretation Date/Time:  Tuesday December 08 2023 10:55:40 EDT Ventricular Rate:  114 PR Interval:  182 QRS Duration:  82 QT Interval:  320 QTC Calculation: 441 R Axis:   39  Text Interpretation: Sinus tachycardia When compared with ECG of 06-Oct-2023 11:21, No significant change was found Confirmed by Daneen Perkins ([id number]) on 12/08/2023 11:04:37 AM  - no acute changes.   Lab Results  Component Value Date   WBC 7.1 10/19/2023   HGB 12.9 10/19/2023   HCT 40.8 10/19/2023   MCV 99.0 10/19/2023   PLT 213 10/19/2023   Lab Results  Component Value Date   CREATININE 0.78 10/19/2023   BUN 10 10/19/2023   NA 140 10/19/2023   K 3.4 (L) 10/19/2023   CL 102 10/19/2023   CO2 26 10/19/2023   Lab Results  Component Value Date   ALT 13 02/28/2023   AST 19 02/28/2023   ALKPHOS 50 02/28/2023   BILITOT 0.3 02/28/2023   Lab Results  Component Value Date   CHOL 99 (L) 01/13/2023   HDL 45 01/13/2023   LDLCALC 31 01/13/2023   LDLDIRECT 104 (H) 06/12/2010   TRIG 131 01/13/2023   CHOLHDL 2.2 01/13/2023    Lab Results  Component Value Date   HGBA1C 6.9 (H) 02/28/2023    Assessment & Plan   1. CAD: Coronary CT angiogram in 08/2022 revealed coronary calcium  score 2262 (99th percentile), extensive TPV, severe CAD of the mid RCA with abnormal FFR, mild proximal LAD and left circumflex  stenoses, possible significant disease of the mid LAD to distal LAD (not mottled by FFR).  Findings were discussed with Dr. Anner and in the absence of symptoms, initial trial of medical therapy was advised. She continues to note daily chest pain despite increased Imdur . She reports that the pain is so severe at times it brings tears to my eyes. She has stable chronic dyspnea, unchanged from prior visits.  Reviewed with Dr. Anner, who recommends pursuing LHC.  Patient is agreeable.  LHC scheduled for 12/23/2023 with Dr. Anner.  Reviewed ED precautions.  Will check CBC, CMET.  Continue aspirin , bisoprolol , losartan , hydralazine , Imdur , Zetia , and Repatha .  Informed Consent   Shared Decision Making/Informed Consent The risks [stroke (1 in 1000), death (1 in 1000), kidney failure [usually temporary] (1 in 500), bleeding (1 in 200), allergic reaction [possibly serious] (1 in 200)], benefits (diagnostic support and management of coronary artery disease) and alternatives of a cardiac catheterization were discussed in detail with Ms. Somm[place] and she is willing to proceed.     2.  Palpitations/tachycardia: EKG shows sinus tachycardia.  She reports  an elevated heart rate for the past month, as well as intermittent palpitations. Will check 14-day ZIO monitor to rule out arrhythmia.  Will increase bisoprolol  to 15 mg daily.  Ischemic evaluation pending as above.  She is post D&C in 10/2023.  She denies bleeding.  CBC pending as above. Low suspicion for PE.   3. LVH/diastolic dysfunction: Echocardiogram in 07/2022 showed EF 60 to 65%, normal LV systolic function, mild LVH, G1 DD, no significant valvular abnormalities.  She has stable chronic dyspnea on exertion in the setting of COPD, generally euvolemic and well compensated on exam.  Continue bisoprolol  as above, Lasix  as needed.   4. Hypertension: BP well controlled. Continue current antihypertensive regimen.   5. Hyperlipidemia/statin intolerance: LDL was 31  in 01/2023.  Will repeat fasting lipid panel. Continue Repatha .    6. Type 2 diabetes: A1c was 6.9 in 02/2023. Monitored and managed per PCP.    7. History of CVA: No recurrence. Continue aspirin , Repatha .   8. OSA: Intermittent adherence to CPAP.  She is pending repeat home sleep study per pulmonology.  9. COPD: Stable intermittent dyspnea. Follows with pulmonology.   10. Anemia: S/p recent D&C for abnormal uterine bleeding.  Denies any recent bleeding.  Repeat CBC pending as above.   11. Disposition: Follow-up 2 weeks post cath.       Damien JAYSON Braver, NP 12/08/2023, 11:04 AM

## 2023-12-09 ENCOUNTER — Other Ambulatory Visit (HOSPITAL_COMMUNITY): Payer: Self-pay

## 2023-12-09 LAB — COMPREHENSIVE METABOLIC PANEL WITH GFR
ALT: 18 IU/L (ref 0–32)
AST: 27 IU/L (ref 0–40)
Albumin: 4.1 g/dL (ref 3.9–4.9)
Alkaline Phosphatase: 82 IU/L (ref 44–121)
BUN/Creatinine Ratio: 14 (ref 12–28)
BUN: 11 mg/dL (ref 8–27)
Bilirubin Total: <0.2 mg/dL (ref 0.0–1.2)
CO2: 22 mmol/L (ref 20–29)
Calcium: 9.9 mg/dL (ref 8.7–10.3)
Chloride: 105 mmol/L (ref 96–106)
Creatinine, Ser: 0.8 mg/dL (ref 0.57–1.00)
Globulin, Total: 2.3 g/dL (ref 1.5–4.5)
Glucose: 102 mg/dL — AB (ref 70–99)
Potassium: 4.2 mmol/L (ref 3.5–5.2)
Sodium: 145 mmol/L — AB (ref 134–144)
Total Protein: 6.4 g/dL (ref 6.0–8.5)
eGFR: 80 mL/min/1.73 (ref >59)

## 2023-12-09 LAB — LIPID PANEL
Cholesterol, Total: 64 mg/dL — AB (ref 100–199)
HDL: 36 mg/dL — AB (ref >39)
LDL CALC COMMENT:: 1.8 ratio (ref 0.0–4.4)
LDL Chol Calc (NIH): 12 mg/dL (ref 0–99)
Triglycerides: 68 mg/dL (ref 0–149)
VLDL Cholesterol Cal: 16 mg/dL (ref 5–40)

## 2023-12-09 LAB — CBC
Hematocrit: 41.4 % (ref 34.0–46.6)
Hemoglobin: 13.3 g/dL (ref 11.1–15.9)
MCH: 30.9 pg (ref 26.6–33.0)
MCHC: 32.1 g/dL (ref 31.5–35.7)
MCV: 96 fL (ref 79–97)
Platelets: 272 x10E3/uL (ref 150–450)
RBC: 4.31 x10E6/uL (ref 3.77–5.28)
RDW: 12.5 % (ref 11.7–15.4)
WBC: 7.4 x10E3/uL (ref 3.4–10.8)

## 2023-12-10 ENCOUNTER — Encounter: Payer: Self-pay | Admitting: Nurse Practitioner

## 2023-12-10 ENCOUNTER — Ambulatory Visit: Payer: Self-pay | Admitting: Nurse Practitioner

## 2023-12-23 ENCOUNTER — Encounter (HOSPITAL_COMMUNITY): Payer: Self-pay | Admitting: Cardiology

## 2023-12-23 ENCOUNTER — Other Ambulatory Visit: Payer: Self-pay

## 2023-12-23 ENCOUNTER — Ambulatory Visit (HOSPITAL_COMMUNITY)
Admission: RE | Disposition: A | Discharge status: Home / Self Care | Payer: Self-pay | Source: Home / Self Care | Attending: Cardiology

## 2023-12-23 ENCOUNTER — Ambulatory Visit (HOSPITAL_COMMUNITY)
Admission: RE | Admit: 2023-12-23 | Discharge: 2023-12-24 | Disposition: A | Discharge status: Home / Self Care | Attending: Cardiology | Admitting: Cardiology

## 2023-12-23 DIAGNOSIS — I2584 Coronary atherosclerosis due to calcified coronary lesion: Secondary | ICD-10-CM | POA: Insufficient documentation

## 2023-12-23 DIAGNOSIS — R9439 Abnormal result of other cardiovascular function study: Secondary | ICD-10-CM | POA: Diagnosis not present

## 2023-12-23 DIAGNOSIS — I509 Heart failure, unspecified: Secondary | ICD-10-CM | POA: Insufficient documentation

## 2023-12-23 DIAGNOSIS — I350 Nonrheumatic aortic (valve) stenosis: Secondary | ICD-10-CM | POA: Insufficient documentation

## 2023-12-23 DIAGNOSIS — I25110 Atherosclerotic heart disease of native coronary artery with unstable angina pectoris: Secondary | ICD-10-CM | POA: Diagnosis not present

## 2023-12-23 DIAGNOSIS — D649 Anemia, unspecified: Secondary | ICD-10-CM | POA: Diagnosis not present

## 2023-12-23 DIAGNOSIS — E785 Hyperlipidemia, unspecified: Secondary | ICD-10-CM | POA: Insufficient documentation

## 2023-12-23 DIAGNOSIS — Z79899 Other long term (current) drug therapy: Secondary | ICD-10-CM | POA: Insufficient documentation

## 2023-12-23 DIAGNOSIS — Z8249 Family history of ischemic heart disease and other diseases of the circulatory system: Secondary | ICD-10-CM | POA: Diagnosis not present

## 2023-12-23 DIAGNOSIS — G4733 Obstructive sleep apnea (adult) (pediatric): Secondary | ICD-10-CM | POA: Diagnosis not present

## 2023-12-23 DIAGNOSIS — J439 Emphysema, unspecified: Secondary | ICD-10-CM | POA: Insufficient documentation

## 2023-12-23 DIAGNOSIS — Z7982 Long term (current) use of aspirin: Secondary | ICD-10-CM | POA: Diagnosis not present

## 2023-12-23 DIAGNOSIS — Z7985 Long-term (current) use of injectable non-insulin antidiabetic drugs: Secondary | ICD-10-CM | POA: Insufficient documentation

## 2023-12-23 DIAGNOSIS — J4489 Other specified chronic obstructive pulmonary disease: Secondary | ICD-10-CM | POA: Insufficient documentation

## 2023-12-23 DIAGNOSIS — I25118 Atherosclerotic heart disease of native coronary artery with other forms of angina pectoris: Secondary | ICD-10-CM

## 2023-12-23 DIAGNOSIS — E119 Type 2 diabetes mellitus without complications: Secondary | ICD-10-CM | POA: Insufficient documentation

## 2023-12-23 DIAGNOSIS — I2510 Atherosclerotic heart disease of native coronary artery without angina pectoris: Secondary | ICD-10-CM

## 2023-12-23 DIAGNOSIS — I110 Hypertensive heart disease with heart failure: Secondary | ICD-10-CM | POA: Diagnosis not present

## 2023-12-23 DIAGNOSIS — Z955 Presence of coronary angioplasty implant and graft: Secondary | ICD-10-CM

## 2023-12-23 DIAGNOSIS — Z8673 Personal history of transient ischemic attack (TIA), and cerebral infarction without residual deficits: Secondary | ICD-10-CM | POA: Diagnosis not present

## 2023-12-23 DIAGNOSIS — I200 Unstable angina: Secondary | ICD-10-CM | POA: Diagnosis present

## 2023-12-23 HISTORY — PX: CORONARY IMAGING/OCT: CATH118326

## 2023-12-23 HISTORY — PX: LEFT HEART CATH AND CORONARY ANGIOGRAPHY: CATH118249

## 2023-12-23 HISTORY — PX: CORONARY STENT INTERVENTION: CATH118234

## 2023-12-23 LAB — GLUCOSE, CAPILLARY
Glucose-Capillary: 129 mg/dL — ABNORMAL HIGH (ref 70–99)
Glucose-Capillary: 129 mg/dL — ABNORMAL HIGH (ref 70–99)

## 2023-12-23 MED ORDER — CLOPIDOGREL BISULFATE 300 MG PO TABS
ORAL_TABLET | ORAL | Status: AC
  Filled 2023-12-23: qty 2

## 2023-12-23 MED ORDER — NITROGLYCERIN 1 MG/10 ML FOR IR/CATH LAB
INTRA_ARTERIAL | Status: AC
  Filled 2023-12-23: qty 10

## 2023-12-23 MED ORDER — TRAMADOL HCL 50 MG PO TABS: 50.0000 mg | ORAL_TABLET | Freq: Four times a day (QID) | ORAL | Status: DC | PRN

## 2023-12-23 MED ORDER — FENTANYL CITRATE (PF) 100 MCG/2ML IJ SOLN
INTRAMUSCULAR | Status: DC | PRN
  Administered 2023-12-23 (×4): 25 ug via INTRAVENOUS

## 2023-12-23 MED ORDER — SODIUM CHLORIDE 0.9% FLUSH: 3.0000 mL | INTRAVENOUS | Status: DC | PRN

## 2023-12-23 MED ORDER — ASPIRIN 81 MG PO CHEW: 81.0000 mg | CHEWABLE_TABLET | Freq: Every day | ORAL | Status: DC | PRN

## 2023-12-23 MED ORDER — BISOPROLOL FUMARATE 5 MG PO TABS
15.0000 mg | ORAL_TABLET | Freq: Every day | ORAL | Status: DC
  Administered 2023-12-23: 15 mg via ORAL
  Filled 2023-12-23: qty 3

## 2023-12-23 MED ORDER — VERAPAMIL HCL 2.5 MG/ML IV SOLN
INTRAVENOUS | Status: DC | PRN
  Administered 2023-12-23: 10 mL via INTRA_ARTERIAL

## 2023-12-23 MED ORDER — PREGABALIN 25 MG PO CAPS
50.0000 mg | ORAL_CAPSULE | Freq: Every day | ORAL | Status: DC
Start: 2023-12-24 — End: 2023-12-24
  Administered 2023-12-24: 50 mg via ORAL
  Filled 2023-12-23: qty 2

## 2023-12-23 MED ORDER — IOHEXOL 350 MG/ML SOLN
INTRAVENOUS | Status: DC | PRN
  Administered 2023-12-23: 160 mL

## 2023-12-23 MED ORDER — LIDOCAINE HCL (PF) 1 % IJ SOLN
INTRAMUSCULAR | Status: DC | PRN
  Administered 2023-12-23: 3 mL

## 2023-12-23 MED ORDER — MIDAZOLAM HCL 2 MG/2ML IJ SOLN
INTRAMUSCULAR | Status: DC | PRN
  Administered 2023-12-23 (×2): 1 mg via INTRAVENOUS

## 2023-12-23 MED ORDER — CLOPIDOGREL BISULFATE 75 MG PO TABS
75.0000 mg | ORAL_TABLET | Freq: Every day | ORAL | Status: DC
  Administered 2023-12-24: 75 mg via ORAL
  Filled 2023-12-23: qty 1

## 2023-12-23 MED ORDER — HEPARIN SODIUM (PORCINE) 1000 UNIT/ML IJ SOLN
INTRAMUSCULAR | Status: AC
  Filled 2023-12-23: qty 10

## 2023-12-23 MED ORDER — SODIUM CHLORIDE 0.9 % IV SOLN: 250.0000 mL | INTRAVENOUS | Status: DC | PRN

## 2023-12-23 MED ORDER — ACETAMINOPHEN 325 MG PO TABS: 650.0000 mg | ORAL_TABLET | ORAL | Status: DC | PRN

## 2023-12-23 MED ORDER — IPRATROPIUM-ALBUTEROL 0.5-2.5 (3) MG/3ML IN SOLN
3.0000 mL | Freq: Four times a day (QID) | RESPIRATORY_TRACT | Status: DC | PRN
  Administered 2023-12-23 – 2023-12-24 (×3): 3 mL via RESPIRATORY_TRACT
  Filled 2023-12-23 (×2): qty 3

## 2023-12-23 MED ORDER — HEPARIN (PORCINE) IN NACL 2000-0.9 UNIT/L-% IV SOLN
INTRAVENOUS | Status: DC | PRN
  Administered 2023-12-23: 1000 mL

## 2023-12-23 MED ORDER — SODIUM CHLORIDE 0.9% FLUSH
3.0000 mL | Freq: Two times a day (BID) | INTRAVENOUS | Status: DC
Start: 2023-12-23 — End: 2023-12-24
  Administered 2023-12-23: 3 mL via INTRAVENOUS

## 2023-12-23 MED ORDER — CLOPIDOGREL BISULFATE 300 MG PO TABS
ORAL_TABLET | ORAL | Status: DC | PRN
  Administered 2023-12-23: 600 mg via ORAL

## 2023-12-23 MED ORDER — FREE WATER: 500.0000 mL | Freq: Once | Status: DC

## 2023-12-23 MED ORDER — SODIUM CHLORIDE 0.9% FLUSH: 3.0000 mL | Freq: Two times a day (BID) | INTRAVENOUS | Status: DC

## 2023-12-23 MED ORDER — ASPIRIN 81 MG PO CHEW: 81.0000 mg | CHEWABLE_TABLET | ORAL | Status: DC

## 2023-12-23 MED ORDER — HYDRALAZINE HCL 20 MG/ML IJ SOLN: 10.0000 mg | INTRAMUSCULAR | Status: AC | PRN

## 2023-12-23 MED ORDER — ONDANSETRON HCL 4 MG/2ML IJ SOLN: 4.0000 mg | Freq: Four times a day (QID) | INTRAMUSCULAR | Status: DC | PRN

## 2023-12-23 MED ORDER — CALCIUM CARBONATE ANTACID 500 MG PO CHEW: 1.0000 | CHEWABLE_TABLET | Freq: Every day | ORAL | Status: DC | PRN

## 2023-12-23 MED ORDER — VERAPAMIL HCL 2.5 MG/ML IV SOLN
INTRAVENOUS | Status: AC
  Filled 2023-12-23: qty 2

## 2023-12-23 MED ORDER — FLUTICASONE PROPIONATE 50 MCG/ACT NA SUSP
2.0000 | Freq: Every day | NASAL | Status: DC
  Administered 2023-12-24: 2 via NASAL
  Filled 2023-12-23: qty 16

## 2023-12-23 MED ORDER — FENTANYL CITRATE (PF) 100 MCG/2ML IJ SOLN
INTRAMUSCULAR | Status: AC
  Filled 2023-12-23: qty 2

## 2023-12-23 MED ORDER — HYDRALAZINE HCL 50 MG PO TABS
50.0000 mg | ORAL_TABLET | Freq: Three times a day (TID) | ORAL | Status: DC
  Administered 2023-12-23 – 2023-12-24 (×2): 50 mg via ORAL
  Filled 2023-12-23 (×2): qty 1

## 2023-12-23 MED ORDER — MORPHINE SULFATE (PF) 2 MG/ML IV SOLN
INTRAVENOUS | Status: AC
  Administered 2023-12-23: 2 mg via INTRAVENOUS
  Filled 2023-12-23: qty 1

## 2023-12-23 MED ORDER — PANTOPRAZOLE SODIUM 40 MG PO TBEC
40.0000 mg | DELAYED_RELEASE_TABLET | Freq: Every day | ORAL | Status: DC
  Administered 2023-12-24: 40 mg via ORAL
  Filled 2023-12-23: qty 1

## 2023-12-23 MED ORDER — LABETALOL HCL 5 MG/ML IV SOLN
INTRAVENOUS | Status: AC
  Filled 2023-12-23: qty 4

## 2023-12-23 MED ORDER — ALBUTEROL SULFATE HFA 108 (90 BASE) MCG/ACT IN AERS: 2.0000 | INHALATION_SPRAY | Freq: Four times a day (QID) | RESPIRATORY_TRACT | Status: DC | PRN

## 2023-12-23 MED ORDER — LABETALOL HCL 5 MG/ML IV SOLN
INTRAVENOUS | Status: DC | PRN
  Administered 2023-12-23: 10 mg via INTRAVENOUS

## 2023-12-23 MED ORDER — MIRABEGRON ER 25 MG PO TB24
25.0000 mg | ORAL_TABLET | Freq: Every day | ORAL | Status: DC
  Administered 2023-12-24: 25 mg via ORAL
  Filled 2023-12-23 (×2): qty 1

## 2023-12-23 MED ORDER — EZETIMIBE 10 MG PO TABS
10.0000 mg | ORAL_TABLET | Freq: Every day | ORAL | Status: DC
  Administered 2023-12-24: 10 mg via ORAL
  Filled 2023-12-23: qty 1

## 2023-12-23 MED ORDER — MIDAZOLAM HCL 2 MG/2ML IJ SOLN
INTRAMUSCULAR | Status: AC
  Filled 2023-12-23: qty 2

## 2023-12-23 MED ORDER — LABETALOL HCL 5 MG/ML IV SOLN: 10.0000 mg | INTRAVENOUS | Status: AC | PRN

## 2023-12-23 MED ORDER — DIPHENHYDRAMINE HCL 25 MG PO CAPS: 50.0000 mg | ORAL_CAPSULE | Freq: Every evening | ORAL | Status: DC | PRN

## 2023-12-23 MED ORDER — HEPARIN (PORCINE) IN NACL 1000-0.9 UT/500ML-% IV SOLN
INTRAVENOUS | Status: DC | PRN
  Administered 2023-12-23: 500 mL

## 2023-12-23 MED ORDER — MORPHINE SULFATE (PF) 2 MG/ML IV SOLN
2.0000 mg | INTRAVENOUS | Status: DC | PRN
  Administered 2023-12-23: 2 mg via INTRAVENOUS
  Filled 2023-12-23: qty 1

## 2023-12-23 MED ORDER — IPRATROPIUM-ALBUTEROL 0.5-2.5 (3) MG/3ML IN SOLN
RESPIRATORY_TRACT | Status: AC
  Filled 2023-12-23: qty 3

## 2023-12-23 MED ORDER — LIDOCAINE HCL (PF) 1 % IJ SOLN
INTRAMUSCULAR | Status: AC
  Filled 2023-12-23: qty 30

## 2023-12-23 MED ORDER — LEVOTHYROXINE SODIUM 100 MCG PO TABS: 200.0000 ug | ORAL_TABLET | Freq: Every day | ORAL | Status: DC

## 2023-12-23 MED ORDER — NITROGLYCERIN 1 MG/10 ML FOR IR/CATH LAB
INTRA_ARTERIAL | Status: DC | PRN
  Administered 2023-12-23 (×4): 200 ug via INTRACORONARY

## 2023-12-23 MED ORDER — NITROGLYCERIN 0.4 MG SL SUBL: 0.4000 mg | SUBLINGUAL_TABLET | SUBLINGUAL | Status: DC | PRN

## 2023-12-23 MED ORDER — HEPARIN SODIUM (PORCINE) 1000 UNIT/ML IJ SOLN
INTRAMUSCULAR | Status: DC | PRN
  Administered 2023-12-23 (×2): 4000 [IU] via INTRAVENOUS

## 2023-12-23 MED ORDER — FREE WATER
500.0000 mL | Freq: Once | Status: AC
  Administered 2023-12-23: 500 mL via ORAL

## 2023-12-23 MED ORDER — LOSARTAN POTASSIUM 50 MG PO TABS
100.0000 mg | ORAL_TABLET | Freq: Every evening | ORAL | Status: DC
  Administered 2023-12-23: 100 mg via ORAL
  Filled 2023-12-23: qty 2

## 2023-12-23 MED ORDER — DOCUSATE SODIUM 100 MG PO CAPS: 300.0000 mg | ORAL_CAPSULE | Freq: Every day | ORAL | Status: DC

## 2023-12-23 MED ORDER — DIPHENHYDRAMINE HCL 25 MG PO TABS: 50.0000 mg | ORAL_TABLET | Freq: Every evening | ORAL | Status: DC | PRN

## 2023-12-23 SURGICAL SUPPLY — 15 items
BALLOON EMERGE MR 3.5X12 (BALLOONS) IMPLANT
BALLOON SAPPHIRE NC24 4.5X12 (BALLOONS) IMPLANT
BALLOON SCOREFLEX 4.0X10 (BALLOONS) IMPLANT
CATH DRAGONFLY OPSTAR (CATHETERS) IMPLANT
CATH INFINITI AMBI 5FR TG (CATHETERS) IMPLANT
CATH LAUNCHER 6FR AL.75 (CATHETERS) IMPLANT
DEVICE RAD COMP TR BAND LRG (VASCULAR PRODUCTS) IMPLANT
GLIDESHEATH SLEND SS 6F .021 (SHEATH) IMPLANT
GUIDEWIRE INQWIRE 1.5J.035X260 (WIRE) IMPLANT
KIT ENCORE 26 ADVANTAGE (KITS) IMPLANT
PACK CARDIAC CATHETERIZATION (CUSTOM PROCEDURE TRAY) ×1 IMPLANT
SET ATX-X65L (MISCELLANEOUS) IMPLANT
SHEATH PROBE COVER 6X72 (BAG) IMPLANT
STENT ONYX FRONTIER 4.0X38 (Permanent Stent) IMPLANT
WIRE ASAHI PROWATER 180CM (WIRE) IMPLANT

## 2023-12-23 NOTE — Progress Notes (Signed)
 TR BAND REMOVAL  LOCATION:   R radial  DEFLATED PER PROTOCOL:   yes  TIME BAND OFF / DRESSING APPLIED:   1730   SITE UPON ARRIVAL:    Level 0  SITE AFTER BAND REMOVAL:    Level 0  CIRCULATION SENSATION AND MOVEMENT:    Within Normal Limits   COMMENTS:

## 2023-12-23 NOTE — Interval H&P Note (Signed)
 History and Physical Interval Note:  12/23/2023 8:54 AM  Grace [name]  has presented today for surgery, with the diagnosis of cad - Class III Angina; Abnormal Coronary CTA.  The various methods of treatment have been discussed with the patient and family. After consideration of risks, benefits and other options for treatment, the patient has consented to  Procedure(s): LEFT HEART CATH AND CORONARY ANGIOGRAPHY (N/A)  PERCUTANEOUS CORONARY INTERVENTION   as a surgical intervention.  The patient's history has been reviewed, patient examined, no change in status, stable for surgery.  I have reviewed the patient's chart and labs.  Questions were answered to the patient's satisfaction.    Cath Lab Visit (complete for each Cath Lab visit)  Clinical Evaluation Leading to the Procedure:   ACS: No.  Non-ACS:    Anginal Classification: CCS III  Anti-ischemic medical therapy: Maximal Therapy (2 or more classes of medications)  Non-Invasive Test Results: High-risk stress test findings: cardiac mortality >3%/year  Prior CABG: No previous CABG      Alm Clay

## 2023-12-23 NOTE — Plan of Care (Signed)

## 2023-12-23 NOTE — Brief Op Note (Signed)
 BRIEF CARDIAC CATH / PCI NOTE  12/23/2023 2:32 PM Grace [name]  MRN: [id number]  Primary Care Provider:  Delores Rojelio Caldron, NP Primary Cardiologist:  Alm Clay, MD    PROCEDURE:  Procedure(s): LEFT HEART CATH AND CORONARY ANGIOGRAPHY (N/A) CORONARY STENT INTERVENTION (N/A) CORONARY IMAGING/OCT (N/A)  SURGEON:  Surgeons and Role:    * Clay Alm ORN, MD - Primary  PATIENT:  Grace [name]  68 y.o. female long-term smoker with a long history COPD and significant CAD noted on Coronary CTA as well as HTN HLD that is essentially failed medical therapy for CAD.  Coronary CTA in May 2024 revealed what appeared to be an occluded LAD and severe RCA disease.  As her symptoms were somewhat nonspecific and pleading at the time, the plan was medical therapy per her request.  However she has had issues with progressive worsening exertional dyspnea and chest discomfort that is random.  She was given cardiology clearance to proceed with D&C for uterine bleeding with the thought that she would be better off having that treated prior to bringing her in for cardiac catheterization to evaluate her symptoms.  She now presents after having been seen by Damien Braver, NP on December 08, 2023.  She has daily chest pain sometimes point to bring to tears to her eyes.  Chronic exertional dyspnea no change.  With progression of anginal symptoms to class III and known CAD once she underwent her D&C the plan was for her to proceed with cardiac catheterization.  PRE-OPERATIVE DIAGNOSIS:  cad/class III angina/abnormal Coronary CTA  POST-OPERATIVE DIAGNOSIS:   Severe two-vessel disease: Small to moderate caliber LAD gives off a bifurcating D1 and a very small caliber D2 that has a mid 60% stenosis after small sidebranch.  The LAD then tapers to relatively minimal vessel and is occluded after giving rise to a very small caliber D3 with diffuse disease. There is a very small caliber high OM1/ramus intermedius as  well as a small caliber OM1.  The LCx is relatively free of disease and courses as a lateral OM 2 that bifurcates distally.  There is a small AV groove branch with 1 small LPL.  No disease. RCA has findings consistent with what was seen on Coronary CTA with that the FFR positive lesions.  The proximal vessel appears moderately calcified and then at either side of the first major bend, there it is a segment of 50% stenosis terminating in tandem 80% calcified lesions on either side of a significant bend.  The remainder of the RCA is free of disease and there is a very large caliber wraparound PDA as well as a small caliber RPA V with 2 posterolateral branches that are free of disease.SABRA Unsuccessful attempts at [facility] either pre or post PCI initially due to inability to cross the lesion with the catheter in the last was likely related to malfunctioning catheter Successful ScoreFlex balloon (4.0 Miller by 10 mm) PTCA followed by DES PCI of the proximal-mid RCA 50% and tandem 80% lesions covering all 3 with a Onyx Frontier DES 4.0 x 38 mm deployed to 4.4 mm and postdilated with a 4.5 mm [place] balloon to 4.6 mm. Relatively normal/low normal LVEF 50 to 60% with normal LVEDP  PROCEDURE PERFORMED Time Out: Verified patient identification, verified procedure, site/side was marked, verified correct patient position, special equipment/implants available, medications/allergies/relevent history reviewed, required imaging and test results available. Performed.  Access:  RIGHT Radial Artery: 6 Fr sheath -- Seldinger technique using Micropuncture  Kit -- Direct ultrasound guidance used.  Permanent image obtained and placed on chart. -- 10 mL radial cocktail IA; 4000 Units IV Heparin   Left Heart Catheterization: 5 and 6 Fr Catheters advanced or exchanged over a J-wire under direct fluoroscopic guidance into the ascending aorta; TIG 4.0 catheter advanced first.  * LV Hemodynamics (LV Gram); Left & Right Coronary Artery  Cineangiography: TIG 4.0 catheter   Review of initial angiography revealed: CTO of the LAD after 3 diagonal branches with severe disease and tiny D2 & D3 branches.  Small caliber ramus normal along with a tiny OM1.  The LCx courses of the lateral OM 2 that bifurcates distally and gives off a small caliber left posterolateral branch with 1 PL branch and another small OM 3.  Minimal disease.  CULPRIT LESION segment was segmental calcified 50%, then tandem 80% lesions at either side of a bend in the proximal to mid RCA   Preparations are made for OCT guided PCI of the proximal mid RCA, however 2 attempts 1.  1 post predilation to advance the catheter was unsuccessful to get through the second bend/lesion.  There was a significant bend at the Rx portion of the catheter went 1 direction and the wire was going the other.  I therefore chose to abort OCT and proceeded with score flex PTCA followed by extensive PCI. Additional bolus of 4000 units IV heparin  was administered to achieve and maintain ACT> 250 seconds 600 mg Plavix  was administered  RCA PCI performed: [place] wire, predilation with 3.5 x 12 standard balloon dilated to 14 atm at both 80% lesions (despite this predilation, the OCT catheter would not advance on second attempt) score flex 4.0 mm by=> 10 mm multiple inflations covering both 80% lesions in the proximal 5% stenosis => [employer] Onyx Frontier DES 4.0 mm 38 mm placed covering the proximal 50% and tandem 80% lesions around the bends.  This was deployed to 4.3 mm and postdilated with [place] balloon to 4.6 mm using a 4.5 mm x 12 mm Delight balloon at high atmospheres.  Again the OCT catheter was able to now would be advanced past the lesion, but it pulled back was not able to be engaged likely due to the stent keeping movement of the catheter.  Angiography revealed widely patent stent.  Upon completion of Angiogaphy, the catheter was removed completely out of the body over a wire, without  complication.  Radial sheath removed in the Cardiac Catheterization lab with TR Band placed for hemostasis.  TR Band: 1415 Hours; 14 mL air; reverse Barbeau A  MEDICATIONS Radial Cocktail: 3 mg Verapmil in 10 mL NS Heparin : 4000 units x 2 IC NTG 2000 mcg x 3   ANESTHESIA:   regional and IV sedation; SQ Lidocaine  3 mL ; 2 mg of Versed , 100 mcg IV Fentanyl   EBL:  <50 mL  MEDICATIONS USED: 160 mL contrast; a 10 mg IV labetalol   PATIENT DISPOSITION:  PACU - hemodynamically stable.  She did have significant anginal pain with OCT advancement and every balloon inflation as well as stent inflation.  She was chest pain-free and only having issues with needing to urinate and was pretty significant hypertensive as a result.  Once she was able to urinate upon moving from the table to the bed she felt better.    DICTATION: .Note written in EPIC  PLAN OF CARE: Admit for overnight observation; During PCI there was a sidebranch that was temporally lost.  After nitroglycerin  flow was starting to return  as her pain was improving.  As such, the plan will be to monitor her overnight to ensure that she is stable. With 38 mm stent in the RCA, would keep her on long-term Plavix  but would do DAPT for at least 3 months and then stop aspirin  With her being on Plavix , would need to convert from omeprazole  to pantoprazole  for GERD She is on stable blood pressure medications but may need to have some adjustments based on her pressures while being monitored overnight. She is on Repatha  for lipids.  This to be continued at home. Continue home nebulizers  Delay start of Pharmacological VTE agent (>24hrs) due to surgical blood loss or risk of bleeding: not applicable    Alm Clay, MD

## 2023-12-24 ENCOUNTER — Encounter (HOSPITAL_COMMUNITY): Payer: Self-pay | Admitting: Cardiology

## 2023-12-24 ENCOUNTER — Other Ambulatory Visit (HOSPITAL_COMMUNITY): Payer: Self-pay

## 2023-12-24 DIAGNOSIS — I25110 Atherosclerotic heart disease of native coronary artery with unstable angina pectoris: Secondary | ICD-10-CM | POA: Diagnosis not present

## 2023-12-24 LAB — CBC
HCT: 40.2 % (ref 36.0–46.0)
Hemoglobin: 12.7 g/dL (ref 12.0–15.0)
MCH: 29.6 pg (ref 26.0–34.0)
MCHC: 31.6 g/dL (ref 30.0–36.0)
MCV: 93.7 fL (ref 80.0–100.0)
Platelets: 246 K/uL (ref 150–400)
RBC: 4.29 MIL/uL (ref 3.87–5.11)
RDW: 13.2 % (ref 11.5–15.5)
WBC: 6.7 K/uL (ref 4.0–10.5)
nRBC: 0 % (ref 0.0–0.2)

## 2023-12-24 LAB — BASIC METABOLIC PANEL WITH GFR
Anion gap: 10 (ref 5–15)
BUN: 6 mg/dL — ABNORMAL LOW (ref 8–23)
CO2: 28 mmol/L (ref 22–32)
Calcium: 9.2 mg/dL (ref 8.9–10.3)
Chloride: 100 mmol/L (ref 98–111)
Creatinine, Ser: 0.8 mg/dL (ref 0.44–1.00)
GFR, Estimated: >60 mL/min
Glucose, Bld: 115 mg/dL — ABNORMAL HIGH (ref 70–99)
Potassium: 3.9 mmol/L (ref 3.5–5.1)
Sodium: 138 mmol/L (ref 135–145)

## 2023-12-24 LAB — POCT ACTIVATED CLOTTING TIME: Activated Clotting Time: 285 s

## 2023-12-24 MED ORDER — PANTOPRAZOLE SODIUM 40 MG PO TBEC
40.0000 mg | DELAYED_RELEASE_TABLET | Freq: Every day | ORAL | 1 refills | Status: DC
  Filled 2023-12-24: qty 90, 90d supply, fill #0

## 2023-12-24 MED ORDER — CLOPIDOGREL BISULFATE 75 MG PO TABS
75.0000 mg | ORAL_TABLET | Freq: Every day | ORAL | 2 refills | Status: DC
  Filled 2023-12-24: qty 90, 90d supply, fill #0

## 2023-12-24 MED FILL — Nitroglycerin IV Soln 100 MCG/ML in D5W: INTRA_ARTERIAL | Qty: 10 | Status: AC

## 2023-12-24 NOTE — Plan of Care (Signed)

## 2023-12-24 NOTE — Progress Notes (Signed)
Pt was educated on stent card, stent location, Antiplatelet and ASA use, wt restrictions, no baths/daily wash-ups, s/s of infection, ex guidelines, s/s to stop exercising, NTG use and calling 911, heart healthy diet, risk factors and CRPII. Pt received materials on exercise, diet, and CRPII. Will refer to [facility].

## 2023-12-24 NOTE — TOC CM/SW Note (Signed)
 Transition of Care [facility]) - Inpatient Brief Assessment   Patient Details  Name: Grace [name] MRN: [id number] Date of Birth: 1955/12/18  Transition of Care [facility] CM/SW Contact:    Sudie Erminio Deems, RN Phone Number: 12/24/2023, 9:45 AM   Clinical Narrative: Patient presented for PCI. Plan for transition home on Plavix . PTA patient was from home with support of friend. Patient has DME cane, rolling walker, and nebulizer. Patient states she has PCP and no issues with transportation. Patient states she can afford medications. Patient has transportation home. No further needs identified at this time.    Transition of Care Asessment: Insurance and Status: Insurance coverage has been reviewed Patient has primary care physician: Yes Home environment has been reviewed: reviewed Prior level of function:: independent Prior/Current Home Services: No current home services Social Drivers of Health Review: SDOH reviewed no interventions necessary Readmission risk has been reviewed: Yes Transition of care needs: no transition of care needs at this time

## 2023-12-24 NOTE — Discharge Instructions (Signed)
 Information about your medication: Plavix  (anti-platelet agent)  Generic Name (Brand): clopidogrel  (Plavix ), once daily medication  PURPOSE: You are taking this medication along with aspirin  to lower your chance of having a heart attack, stroke, or blood clots in your heart stent. These can be fatal. Plavix  and aspirin  help prevent platelets from sticking together and forming a clot that can block an artery or your stent.   Common SIDE EFFECTS you may experience include: bruising or bleeding more easily, shortness of breath  Do not stop taking PLAVIX  without talking to the doctor who prescribes it for you. People who are treated with a stent and stop taking Plavix  too soon, have a higher risk of getting a blood clot in the stent, having a heart attack, or dying. If you stop Plavix  because of bleeding, or for other reasons, your risk of a heart attack or stroke may increase.   Avoid taking NSAID agents or anti-inflammatory medications such as ibuprofen, naproxen given increased bleed risk with plavix  - can use acetaminophen  (Tylenol ) if needed for pain.  Avoid taking over the counter stomach medications omeprazole (Prilosec) or esomeprazole (Nexium) since these do interact and make plavix  less effective - ask your pharmacist or doctor for alterative agents if needed for heartburn or GERD.   Tell all of your doctors and dentists that you are taking Plavix . They should talk to the doctor who prescribed Plavix  for you before you have any surgery or invasive procedure.   Contact your health care provider if you experience: severe or uncontrollable bleeding, pink/red/brown urine, vomiting blood or vomit that looks like coffee grounds, red or black stools (looks like tar), coughing up blood or blood clots ----------------------------------------------------------------------------------------------------------------------

## 2023-12-24 NOTE — Discharge Summary (Signed)
 Discharge Summary   Patient ID: Grace [name] MRN: [id number]; DOB: 10-Nov-1955  Admit date: 12/23/2023 Discharge date: 12/24/2023  PCP:  Delores Rojelio Caldron, NP   [facility] Providers Cardiologist:  Alm Clay, MD     Discharge Diagnoses  Principal Problem:   CAD S/P percutaneous coronary angioplasty  Diagnostic Studies/Procedures   Cath: 12/23/2023    Prox RCA-1 lesion is 50% stenosed. Prox RCA-2 lesion is 80% stenosed.  Mid RCA lesion is 80% stenosed with 40% stenosed side branch in Acute Mrg.   Scoring balloon angioplasty was performed using a BALLOON SCOREFLEX 4.0X10 at both 80% lesions and into the percent stenosis.   Prox RCA-2 lesion is 80% stenosed.   A drug-eluting stent was successfully placed covering all 3 lesions, using a STENT ONYX FRONTIER 4.0X38 postdilated to 4.2 mm.  Post intervention, there is a 0-15% residual stenosis with TIMI-3 flow maintained however small sidebranch was temporarily occluded and recovered prior to completion of procedure.   Post intervention, there is a 0% residual stenosis.   The left ventricular systolic function is normal. The left ventricular ejection fraction is 55-65% by visual estimate.   LV end diastolic pressure is normal.   There is mild aortic valve stenosis. There is no aortic valve regurgitation.   Continue risk factor GDMT-is on Repatha  for lipids.  Blood pressure grossly well-controlled.   Recommend uninterrupted dual antiplatelet therapy with Aspirin  81mg  daily and Clopidogrel  75mg  daily for a minimum of 6 months (stable ischemic heart disease-Class I recommendation).   Diagnostic: Dominance: Right                                                                         Intervention         RECOMMENDATIONS    I  Prox RCA-1 lesion is 50% stenosed. Prox RCA-2 lesion is 80% stenosed.  Mid RCA lesion is 80% stenosed with 40% stenosed side branch in Acute Mrg.   Scoring balloon angioplasty was performed using a  BALLOON SCOREFLEX 4.0X10 at both 80% lesions and into the percent stenosis.   Prox RCA-2 lesion is 80% stenosed.   A drug-eluting stent was successfully placed covering all 3 lesions, using a STENT ONYX FRONTIER 4.0X38 postdilated to 4.2 mm.  Post intervention, there is a 0-15% residual stenosis with TIMI-3 flow maintained however small sidebranch was temporarily occluded and recovered prior to completion of procedure.   Post intervention, there is a 0% residual stenosis.   The left ventricular systolic function is normal. The left ventricular ejection fraction is 55-65% by visual estimate.   LV end diastolic pressure is normal.   There is mild aortic valve stenosis. There is no aortic valve regurgitation.   Continue risk factor GDMT-is on Repatha  for lipids.  Blood pressure grossly well-controlled.   Recommend uninterrupted dual antiplatelet therapy with Aspirin  81mg  daily and Clopidogrel  75mg  daily for a minimum of 6 months (stable ischemic heart disease-Class I recommendation).      Alm Clay _____________   History of Present Illness   [name] is a 68 y.o. female with past medical history of hypertension, hyperlipidemia, hypothyroidism, diabetes, CVA, OSA, COPD and GERD recently seen in the office on 8/26.  Strong family history  of CAD and initially contacted the office 08/2022 after having had an echocardiogram done by her PCP which showed LVEF of 60 to 65%, mild LVH, grade 1 diastolic dysfunction without significant valvular disease.  Underwent coronary CT angiogram which showed a calcium  score of 2262 which was 99 percentile with concern of severe CAD in the mid RCA with abnormal FFR mild proximal LAD and circumflex stenosis.  Findings were discussed with patient's primary cardiologist Dr. Anner in the absence of symptoms she was trialed on medical therapy.  Started on Imdur , aspirin , high-dose statin and beta-blocker.  Developed statin intolerance and was referred to lipid clinic  for consideration of PCSK9.  Hospitalized 02/2023 in the setting of severe symptomatic anemia with a hemoglobin of 4.6 with endoscopy showing Barrett's esophagus.  Underwent D&C for abnormal uterine bleeding 10/2023.  Seen back in the office 12/08/2023 and noted continual daily chest pain which was severe at times.  Chronic stable dyspnea unchanged from prior visits.  She was ultimately set up for outpatient cardiac catheterization.   Hospital Course    Underwent cardiac catheterization on 9/10 with Dr. Anner as noted above with proximal RCA 50%-80% stenosis with mid RCA 80% stenosis treated with PCI/DES x 1 covering all 3 lesions.  LV gram showed LVEF of 55 to 60%.  LVEDP normal.  Recommendations for DAPT with aspirin /Plavix  for at least 6 months.  Labs the following morning were stable.  Seen by cardiac rehab.  Did the patient have an acute coronary syndrome (MI, NSTEMI, STEMI, etc) this admission?:  No                               Did the patient have a percutaneous coronary intervention (stent / angioplasty)?:  Yes.     Cath/PCI Registry Performance & Quality Measures: Aspirin  prescribed? - Yes ADP Receptor Inhibitor (Plavix /Clopidogrel , Brilinta/Ticagrelor or Effient/Prasugrel) prescribed (includes medically managed patients)? - Yes High Intensity Statin (Lipitor 40-80mg  or Crestor  20-40mg ) prescribed? - No - statin intolerant on repatha  For EF <40%, was ACEI/ARB prescribed? - Not Applicable (EF >/= 40%) For EF <40%, Aldosterone Antagonist (Spironolactone or Eplerenone) prescribed? - Not Applicable (EF >/= 40%) Cardiac Rehab Phase II ordered? - Yes       The patient will be scheduled for a TOC follow up appointment in 10-14 days.  A message has been sent to the [facility] and Scheduling Pool at the office where the patient should be seen for follow up.  _____________  Discharge Vitals Blood pressure (!) 158/91, pulse 79, temperature 98 F (36.7 C), temperature source Oral, resp.  rate 18, height 5' 3 (1.6 m), weight 81.6 kg, last menstrual period 02/27/2014, SpO2 90%.  Filed Weights   12/23/23 0839  Weight: 81.6 kg    Labs & Radiologic Studies  CBC Recent Labs    12/24/23 0438  WBC 6.7  HGB 12.7  HCT 40.2  MCV 93.7  PLT 246   Basic Metabolic Panel Recent Labs    90/88/74 0438  NA 138  K 3.9  CL 100  CO2 28  GLUCOSE 115*  BUN 6*  CREATININE 0.80  CALCIUM  9.2   Liver Function Tests No results for input(s): AST, ALT, ALKPHOS, BILITOT, PROT, ALBUMIN in the last 72 hours. No results for input(s): LIPASE, AMYLASE in the last 72 hours. High Sensitivity Troponin:   No results for input(s): TROPONINIHS in the last 720 hours.  No results for input(s): TRNPT in  the last 720 hours.  BNP Invalid input(s): POCBNP No results for input(s): PROBNP in the last 72 hours.  No results for input(s): BNP in the last 72 hours.  D-Dimer No results for input(s): DDIMER in the last 72 hours. Hemoglobin A1C No results for input(s): HGBA1C in the last 72 hours. Fasting Lipid Panel No results for input(s): CHOL, HDL, LDLCALC, TRIG, CHOLHDL, LDLDIRECT in the last 72 hours. No results found for: LIPOA  Thyroid  Function Tests No results for input(s): TSH, T4TOTAL, T3FREE, THYROIDAB in the last 72 hours.  Invalid input(s): FREET3 _____________  CARDIAC CATHETERIZATION Result Date: 12/23/2023 Table formatting from the original result was not included. Images from the original result were not included.   Prox RCA-1 lesion is 50% stenosed. Prox RCA-2 lesion is 80% stenosed.  Mid RCA lesion is 80% stenosed with 40% stenosed side branch in Acute Mrg.   Scoring balloon angioplasty was performed using a BALLOON SCOREFLEX 4.0X10 at both 80% lesions and into the percent stenosis.   Prox RCA-2 lesion is 80% stenosed.   A drug-eluting stent was successfully placed covering all 3 lesions, using a STENT ONYX FRONTIER 4.0X38  postdilated to 4.2 mm.  Post intervention, there is a 0-15% residual stenosis with TIMI-3 flow maintained however small sidebranch was temporarily occluded and recovered prior to completion of procedure.   Post intervention, there is a 0% residual stenosis.   The left ventricular systolic function is normal. The left ventricular ejection fraction is 55-65% by visual estimate.   LV end diastolic pressure is normal.   There is mild aortic valve stenosis. There is no aortic valve regurgitation.   Continue risk factor GDMT-is on Repatha  for lipids.  Blood pressure grossly well-controlled.   Recommend uninterrupted dual antiplatelet therapy with Aspirin  81mg  daily and Clopidogrel  75mg  daily for a minimum of 6 months (stable ischemic heart disease-Class I recommendation). Diagnostic: Dominance: Right       Intervention    RECOMMENDATIONS   I  Prox RCA-1 lesion is 50% stenosed. Prox RCA-2 lesion is 80% stenosed.  Mid RCA lesion is 80% stenosed with 40% stenosed side branch in Acute Mrg.   Scoring balloon angioplasty was performed using a BALLOON SCOREFLEX 4.0X10 at both 80% lesions and into the percent stenosis.   Prox RCA-2 lesion is 80% stenosed.   A drug-eluting stent was successfully placed covering all 3 lesions, using a STENT ONYX FRONTIER 4.0X38 postdilated to 4.2 mm.  Post intervention, there is a 0-15% residual stenosis with TIMI-3 flow maintained however small sidebranch was temporarily occluded and recovered prior to completion of procedure.   Post intervention, there is a 0% residual stenosis.   The left ventricular systolic function is normal. The left ventricular ejection fraction is 55-65% by visual estimate.   LV end diastolic pressure is normal.   There is mild aortic valve stenosis. There is no aortic valve regurgitation.   Continue risk factor GDMT-is on Repatha  for lipids.  Blood pressure grossly well-controlled.   Recommend uninterrupted dual antiplatelet therapy with Aspirin  81mg  daily and Clopidogrel   75mg  daily for a minimum of 6 months (stable ischemic heart disease-Class I recommendation).   Alm Clay   Disposition Pt is being discharged home today in good condition.  Follow-up Plans & Appointments  Discharge Instructions     AMB Referral to Cardiac Rehabilitation - Phase II   Complete by: As directed    Diagnosis:  Stable Angina Coronary Stents     After initial evaluation and assessments completed: Virtual  Based Care may be provided alone or in conjunction with Phase 2 Cardiac Rehab based on patient barriers.: Yes   Intensive Cardiac Rehabilitation (ICR) [facility] location only OR Traditional Cardiac Rehabilitation (TCR) *If criteria for ICR are not met will enroll in TCR [facility] only): Yes   Call MD for:  difficulty breathing, headache or visual disturbances   Complete by: As directed    Call MD for:  persistant dizziness or light-headedness   Complete by: As directed    Call MD for:  redness, tenderness, or signs of infection (pain, swelling, redness, odor or green/yellow discharge around incision site)   Complete by: As directed    Diet - low sodium heart healthy   Complete by: As directed    Discharge instructions   Complete by: As directed    Radial Site Care Refer to this sheet in the next few weeks. These instructions provide you with information on caring for yourself after your procedure. Your caregiver may also give you more specific instructions. Your treatment has been planned according to current medical practices, but problems sometimes occur. Call your caregiver if you have any problems or questions after your procedure. HOME CARE INSTRUCTIONS You may shower the day after the procedure. Remove the bandage (dressing) and gently wash the site with plain soap and water . Gently pat the site dry.  Do not apply powder or lotion to the site.  Do not submerge the affected site in water  for 3 to 5 days.  Inspect the site at least twice daily.  Do not flex or bend the  affected arm for 24 hours.  No lifting over 5 pounds (2.3 kg) for 5 days after your procedure.  Do not drive home if you are discharged the same day of the procedure. Have someone else drive you.  You may drive 24 hours after the procedure unless otherwise instructed by your caregiver.  What to expect: Any bruising will usually fade within 1 to 2 weeks.  Blood that collects in the tissue (hematoma) may be painful to the touch. It should usually decrease in size and tenderness within 1 to 2 weeks.  SEEK IMMEDIATE MEDICAL CARE IF: You have unusual pain at the radial site.  You have redness, warmth, swelling, or pain at the radial site.  You have drainage (other than a small amount of blood on the dressing).  You have chills.  You have a fever or persistent symptoms for more than 72 hours.  You have a fever and your symptoms suddenly get worse.  Your arm becomes pale, cool, tingly, or numb.  You have heavy bleeding from the site. Hold pressure on the site.   PLEASE DO NOT MISS ANY DOSES OF YOUR PLAVIX !!!!! Also keep a log of you blood pressures and bring back to your follow up appt. Please call the office with any questions.   Patients taking blood thinners should generally stay away from medicines like ibuprofen, Advil, Motrin, naproxen, and Aleve due to risk of stomach bleeding. You may take Tylenol  as directed or talk to your primary doctor about alternatives.  Some studies suggest Prilosec/Omeprazole  interacts with Plavix . We changed your Prilosec/Omeprazole  to the equivalent dose of Protonix  for less chance of interaction.  PLEASE ENSURE THAT YOU DO NOT RUN OUT OF YOUR PLAVIX . This medication is very important to remain on for at least 6months. IF you have issues obtaining this medication due to cost please CALL the office 3-5 business days prior to running out in order to  prevent missing doses of this medication.   Increase activity slowly   Complete by: As directed        Discharge  Medications Allergies as of 12/24/2023       Reactions   Amlodipine     Cramps   Codeine    Headaches    Gabapentin     headache   Prozac [fluoxetine Hcl] Other (See Comments)   Suicidal ideations        Medication List     STOP taking these medications    GOODY HEADACHE PO   ibuprofen 200 MG tablet Commonly known as: ADVIL   omeprazole  40 MG capsule Commonly known as: PRILOSEC Replaced by: pantoprazole  40 MG tablet       TAKE these medications    albuterol  108 (90 Base) MCG/ACT inhaler Commonly known as: ProAir  HFA INHALE 2 PUFFS EVERY 6  HOURS AS NEEDED FOR  SHORTNESS OF BREATH OR  WHEEZING   aspirin  81 MG chewable tablet Chew 81 mg by mouth daily as needed (chest pain).   bisoprolol  10 MG tablet Commonly known as: ZEBETA  Take 1 & 1/2  tablets (15 mg total) by mouth daily.   calcium  carbonate 500 MG chewable tablet Commonly known as: TUMS - dosed in mg elemental calcium  Chew 1 tablet by mouth daily as needed for indigestion or heartburn.   clopidogrel  75 MG tablet Commonly known as: PLAVIX  Take 1 tablet (75 mg total) by mouth daily with breakfast. Start taking on: December 25, 2023   diphenhydrAMINE  25 MG tablet Commonly known as: BENADRYL  Take 50 mg by mouth at bedtime as needed for sleep.   docusate sodium  100 MG capsule Commonly known as: COLACE Take 300 mg by mouth daily.   estradiol  0.1 MG/GM vaginal cream Commonly known as: ESTRACE  VAGINAL Place 1 Applicatorful vaginally at bedtime. What changed: when to take this   ezetimibe  10 MG tablet Commonly known as: ZETIA  Take 10 mg by mouth daily.   fluticasone  50 MCG/ACT nasal spray Commonly known as: FLONASE  USE 2 SPRAYS IN BOTH NOSTRILS  DAILY   furosemide  40 MG tablet Commonly known as: LASIX  TAKE 1 TABLET BY MOUTH DAILY AS  NEEDED FOR WEIGHT GAIN OF 3 LB  OVER AT NIGHT OR 5 LB IN 1 WEEK   glucose blood test strip Commonly known as: OneTouch Verio Use to check blood sugar twice  daily. Dx: E11.9   hydrALAZINE  50 MG tablet Commonly known as: APRESOLINE  Take 1 tablet (50 mg total) by mouth 3 (three) times daily. May take an additional 50 mg if needed for blood pressureif still elevated is greater than or equal to 160 systolic after 1 hour recheck of blood pressure   ipratropium-albuterol  0.5-2.5 (3) MG/3ML Soln Commonly known as: DUONEB Take 3 mLs by nebulization every 6 (six) hours as needed.   isosorbide  mononitrate 30 MG 24 hr tablet Commonly known as: IMDUR  Take 3 tablets (90 mg total) by mouth daily.   levothyroxine  200 MCG tablet Commonly known as: SYNTHROID  Take 200 mcg by mouth at bedtime.   losartan  100 MG tablet Commonly known as: COZAAR  TAKE 1 TABLET BY MOUTH  DAILY What changed: when to take this   Nebulizer/Tubing/Mouthpiece Kit 1 application by Does not apply route every 6 (six) hours as needed. FAX TO [phone]   nitroGLYCERIN  0.4 MG SL tablet Commonly known as: NITROSTAT  Place 0.4 mg under the tongue every 5 (five) minutes as needed for chest pain.   OneTouch Delica Plus Lancet33G Misc USE TO CHECK  BLOOD SUGAR  TWICE DAILY   Ozempic (0.25 or 0.5 MG/DOSE) 2 MG/3ML Sopn Generic drug: Semaglutide(0.25 or 0.5MG /DOS) Inject 0.5 mg into the skin once a week.   pantoprazole  40 MG tablet Commonly known as: PROTONIX  Take 1 tablet (40 mg total) by mouth daily. Replaces: omeprazole  40 MG capsule   pregabalin  50 MG capsule Commonly known as: LYRICA  Take 50 mg by mouth daily.   Repatha  SureClick 140 MG/ML Soaj Generic drug: Evolocumab  INJECT 1 PEN SUBCUTANEOUSLY  EVERY 2 WEEKS   traMADol  50 MG tablet Commonly known as: ULTRAM  Take 50 mg by mouth every 6 (six) hours as needed for moderate pain (pain score 4-6) or severe pain (pain score 7-10).   valACYclovir  500 MG tablet Commonly known as: VALTREX  TAKE 1 TABLET BY MOUTH  DAILY   Vibegron  75 MG Tabs Take 1 tablet (75 mg total) by mouth daily.        Outstanding  Labs/Studies  N/a   Duration of Discharge Encounter: APP Time: 15 minutes   Signed, Manuelita Rummer, NP 12/24/2023, 9:21 AM

## 2023-12-25 ENCOUNTER — Telehealth (HOSPITAL_COMMUNITY): Payer: Self-pay

## 2023-12-25 LAB — LIPOPROTEIN A (LPA): Lipoprotein (a): <8.4 nmol/L (ref <75.0)

## 2023-12-25 NOTE — Telephone Encounter (Signed)
 Called patient to see if she is interested in the Cardiac Rehab Program. Patient expressed interest. Explained scheduling process and went over insurance, patient verbalized understanding. Will contact patient for scheduling once f/u has been completed.

## 2023-12-25 NOTE — Telephone Encounter (Signed)
 Pt insurance is active and benefits verified through [facility] Dual Co-pay 0, DED $257/$257 met, out of pocket $9,350/$1,723.99 met, co-insurance 20%. no pre-authorization required. Passport, 12/25/2023@10 :20, REF# [id number]   TCR/ICR? ICR Visit(date of service)limitation? No limit Can multiple codes be used on the same date of service/visit?(IF ITS A LIMIT) yes    Is this a lifetime maximum or an annual maximum? annual Has the member used any of these services to date? no Is there a time limit (weeks/months) on start of program and/or program completion? no     Will contact patient to see if she is interested in the Cardiac Rehab Program. If interested, patient will need to complete follow up appt. Once completed, patient will be contacted for scheduling upon review by the RN Navigator.

## 2023-12-29 ENCOUNTER — Other Ambulatory Visit: Payer: Self-pay

## 2023-12-29 ENCOUNTER — Inpatient Hospital Stay (HOSPITAL_COMMUNITY)
Admission: EM | Admit: 2023-12-29 | Discharge: 2024-01-03 | DRG: 377 | Disposition: A | Discharge status: Home / Self Care | Attending: Internal Medicine | Admitting: Internal Medicine

## 2023-12-29 ENCOUNTER — Emergency Department (HOSPITAL_COMMUNITY)

## 2023-12-29 ENCOUNTER — Encounter (HOSPITAL_COMMUNITY): Payer: Self-pay

## 2023-12-29 DIAGNOSIS — I10 Essential (primary) hypertension: Secondary | ICD-10-CM | POA: Diagnosis present

## 2023-12-29 DIAGNOSIS — K922 Gastrointestinal hemorrhage, unspecified: Secondary | ICD-10-CM | POA: Diagnosis present

## 2023-12-29 DIAGNOSIS — K254 Chronic or unspecified gastric ulcer with hemorrhage: Principal | ICD-10-CM | POA: Diagnosis present

## 2023-12-29 DIAGNOSIS — K219 Gastro-esophageal reflux disease without esophagitis: Secondary | ICD-10-CM | POA: Diagnosis present

## 2023-12-29 DIAGNOSIS — Z7985 Long-term (current) use of injectable non-insulin antidiabetic drugs: Secondary | ICD-10-CM

## 2023-12-29 DIAGNOSIS — R079 Chest pain, unspecified: Secondary | ICD-10-CM

## 2023-12-29 DIAGNOSIS — E66811 Obesity, class 1: Secondary | ICD-10-CM | POA: Diagnosis present

## 2023-12-29 DIAGNOSIS — Z6831 Body mass index (BMI) 31.0-31.9, adult: Secondary | ICD-10-CM

## 2023-12-29 DIAGNOSIS — Z7982 Long term (current) use of aspirin: Secondary | ICD-10-CM

## 2023-12-29 DIAGNOSIS — K921 Melena: Secondary | ICD-10-CM | POA: Diagnosis not present

## 2023-12-29 DIAGNOSIS — E119 Type 2 diabetes mellitus without complications: Secondary | ICD-10-CM | POA: Diagnosis present

## 2023-12-29 DIAGNOSIS — E785 Hyperlipidemia, unspecified: Secondary | ICD-10-CM | POA: Diagnosis present

## 2023-12-29 DIAGNOSIS — I4719 Other supraventricular tachycardia: Secondary | ICD-10-CM | POA: Diagnosis present

## 2023-12-29 DIAGNOSIS — Z87891 Personal history of nicotine dependence: Secondary | ICD-10-CM

## 2023-12-29 DIAGNOSIS — F419 Anxiety disorder, unspecified: Secondary | ICD-10-CM | POA: Diagnosis present

## 2023-12-29 DIAGNOSIS — J4489 Other specified chronic obstructive pulmonary disease: Secondary | ICD-10-CM | POA: Diagnosis present

## 2023-12-29 DIAGNOSIS — E8770 Fluid overload, unspecified: Secondary | ICD-10-CM | POA: Diagnosis not present

## 2023-12-29 DIAGNOSIS — Z8249 Family history of ischemic heart disease and other diseases of the circulatory system: Secondary | ICD-10-CM

## 2023-12-29 DIAGNOSIS — K2270 Barrett's esophagus without dysplasia: Secondary | ICD-10-CM | POA: Diagnosis present

## 2023-12-29 DIAGNOSIS — Z7902 Long term (current) use of antithrombotics/antiplatelets: Secondary | ICD-10-CM

## 2023-12-29 DIAGNOSIS — G8929 Other chronic pain: Secondary | ICD-10-CM | POA: Diagnosis present

## 2023-12-29 DIAGNOSIS — R578 Other shock: Secondary | ICD-10-CM | POA: Diagnosis present

## 2023-12-29 DIAGNOSIS — Z79890 Hormone replacement therapy (postmenopausal): Secondary | ICD-10-CM

## 2023-12-29 DIAGNOSIS — I2489 Other forms of acute ischemic heart disease: Secondary | ICD-10-CM | POA: Diagnosis present

## 2023-12-29 DIAGNOSIS — G4733 Obstructive sleep apnea (adult) (pediatric): Secondary | ICD-10-CM | POA: Diagnosis present

## 2023-12-29 DIAGNOSIS — Z825 Family history of asthma and other chronic lower respiratory diseases: Secondary | ICD-10-CM

## 2023-12-29 DIAGNOSIS — Z23 Encounter for immunization: Secondary | ICD-10-CM

## 2023-12-29 DIAGNOSIS — Z8673 Personal history of transient ischemic attack (TIA), and cerebral infarction without residual deficits: Secondary | ICD-10-CM

## 2023-12-29 DIAGNOSIS — Z823 Family history of stroke: Secondary | ICD-10-CM

## 2023-12-29 DIAGNOSIS — Z79899 Other long term (current) drug therapy: Secondary | ICD-10-CM

## 2023-12-29 DIAGNOSIS — R7989 Other specified abnormal findings of blood chemistry: Secondary | ICD-10-CM

## 2023-12-29 DIAGNOSIS — Z66 Do not resuscitate: Secondary | ICD-10-CM | POA: Diagnosis present

## 2023-12-29 DIAGNOSIS — Z888 Allergy status to other drugs, medicaments and biological substances status: Secondary | ICD-10-CM

## 2023-12-29 DIAGNOSIS — Z955 Presence of coronary angioplasty implant and graft: Secondary | ICD-10-CM

## 2023-12-29 DIAGNOSIS — Z885 Allergy status to narcotic agent status: Secondary | ICD-10-CM

## 2023-12-29 DIAGNOSIS — I2510 Atherosclerotic heart disease of native coronary artery without angina pectoris: Secondary | ICD-10-CM | POA: Diagnosis present

## 2023-12-29 DIAGNOSIS — E039 Hypothyroidism, unspecified: Secondary | ICD-10-CM | POA: Diagnosis present

## 2023-12-29 DIAGNOSIS — D62 Acute posthemorrhagic anemia: Secondary | ICD-10-CM | POA: Diagnosis present

## 2023-12-29 LAB — I-STAT CHEM 8, ED
BUN: 21 mg/dL (ref 8–23)
Calcium, Ion: 1.1 mmol/L — ABNORMAL LOW (ref 1.15–1.40)
Chloride: 101 mmol/L (ref 98–111)
Creatinine, Ser: 0.8 mg/dL (ref 0.44–1.00)
Glucose, Bld: 153 mg/dL — ABNORMAL HIGH (ref 70–99)
HCT: 30 % — ABNORMAL LOW (ref 36.0–46.0)
Hemoglobin: 10.2 g/dL — ABNORMAL LOW (ref 12.0–15.0)
Potassium: 3.4 mmol/L — ABNORMAL LOW (ref 3.5–5.1)
Sodium: 141 mmol/L (ref 135–145)
TCO2: 26 mmol/L (ref 22–32)

## 2023-12-29 LAB — TROPONIN I (HIGH SENSITIVITY)
Troponin I (High Sensitivity): 156 ng/L (ref <18)
Troponin I (High Sensitivity): 185 ng/L (ref <18)

## 2023-12-29 LAB — CBC
HCT: 31.7 % — ABNORMAL LOW (ref 36.0–46.0)
Hemoglobin: 9.4 g/dL — ABNORMAL LOW (ref 12.0–15.0)
MCH: 29.6 pg (ref 26.0–34.0)
MCHC: 29.7 g/dL — ABNORMAL LOW (ref 30.0–36.0)
MCV: 99.7 fL (ref 80.0–100.0)
Platelets: 269 K/uL (ref 150–400)
RBC: 3.18 MIL/uL — ABNORMAL LOW (ref 3.87–5.11)
RDW: 13.5 % (ref 11.5–15.5)
WBC: 8.9 K/uL (ref 4.0–10.5)
nRBC: 0 % (ref 0.0–0.2)

## 2023-12-29 LAB — HEPATIC FUNCTION PANEL
ALT: 18 U/L (ref 0–44)
AST: 26 U/L (ref 15–41)
Albumin: 2.6 g/dL — ABNORMAL LOW (ref 3.5–5.0)
Alkaline Phosphatase: 51 U/L (ref 38–126)
Bilirubin, Direct: <0.1 mg/dL (ref 0.0–0.2)
Total Bilirubin: 0.4 mg/dL (ref 0.0–1.2)
Total Protein: 5.1 g/dL — ABNORMAL LOW (ref 6.5–8.1)

## 2023-12-29 LAB — BASIC METABOLIC PANEL WITH GFR
Anion gap: 12 (ref 5–15)
BUN: 20 mg/dL (ref 8–23)
CO2: 26 mmol/L (ref 22–32)
Calcium: 9 mg/dL (ref 8.9–10.3)
Chloride: 102 mmol/L (ref 98–111)
Creatinine, Ser: 0.86 mg/dL (ref 0.44–1.00)
GFR, Estimated: >60 mL/min (ref >60)
Glucose, Bld: 150 mg/dL — ABNORMAL HIGH (ref 70–99)
Potassium: 3.5 mmol/L (ref 3.5–5.1)
Sodium: 140 mmol/L (ref 135–145)

## 2023-12-29 LAB — LACTIC ACID, PLASMA: Lactic Acid, Venous: 1.2 mmol/L (ref 0.5–1.9)

## 2023-12-29 LAB — LIPASE, BLOOD: Lipase: 34 U/L (ref 11–51)

## 2023-12-29 LAB — POC OCCULT BLOOD, ED: Fecal Occult Bld: POSITIVE — AB

## 2023-12-29 LAB — PROTIME-INR
INR: 1.1 (ref 0.8–1.2)
Prothrombin Time: 14.5 s (ref 11.4–15.2)

## 2023-12-29 LAB — HEMOGLOBIN AND HEMATOCRIT, BLOOD
HCT: 27.4 % — ABNORMAL LOW (ref 36.0–46.0)
Hemoglobin: 8.3 g/dL — ABNORMAL LOW (ref 12.0–15.0)

## 2023-12-29 MED ORDER — ASPIRIN 81 MG PO TBEC
81.0000 mg | DELAYED_RELEASE_TABLET | Freq: Every day | ORAL | Status: DC
Start: 2023-12-30 — End: 2023-12-30

## 2023-12-29 MED ORDER — POTASSIUM CHLORIDE 10 MEQ/100ML IV SOLN
10.0000 meq | INTRAVENOUS | Status: AC
  Administered 2023-12-30 (×4): 10 meq via INTRAVENOUS
  Filled 2023-12-29 (×4): qty 100

## 2023-12-29 MED ORDER — SODIUM CHLORIDE 0.9 % IV BOLUS
1000.0000 mL | Freq: Once | INTRAVENOUS | Status: AC
  Administered 2023-12-29: 1000 mL via INTRAVENOUS

## 2023-12-29 MED ORDER — HEPARIN BOLUS VIA INFUSION
4000.0000 [IU] | Freq: Once | INTRAVENOUS | Status: AC
Start: 2023-12-29 — End: 2023-12-29
  Administered 2023-12-29: 4000 [IU] via INTRAVENOUS
  Filled 2023-12-29: qty 4000

## 2023-12-29 MED ORDER — SODIUM CHLORIDE 0.9% IV SOLUTION: Freq: Once | INTRAVENOUS | Status: AC

## 2023-12-29 MED ORDER — CLOPIDOGREL BISULFATE 75 MG PO TABS
75.0000 mg | ORAL_TABLET | Freq: Every day | ORAL | Status: DC
  Administered 2023-12-30 – 2024-01-03 (×5): 75 mg via ORAL
  Filled 2023-12-29 (×5): qty 1

## 2023-12-29 MED ORDER — FENTANYL CITRATE PF 50 MCG/ML IJ SOSY
50.0000 ug | PREFILLED_SYRINGE | Freq: Once | INTRAMUSCULAR | Status: AC
  Administered 2023-12-29: 50 ug via INTRAVENOUS
  Filled 2023-12-29: qty 1

## 2023-12-29 MED ORDER — LACTATED RINGERS IV SOLN: INTRAVENOUS | Status: DC

## 2023-12-29 MED ORDER — PANTOPRAZOLE SODIUM 40 MG IV SOLR
40.0000 mg | Freq: Two times a day (BID) | INTRAVENOUS | Status: DC
  Administered 2023-12-30 – 2023-12-31 (×4): 40 mg via INTRAVENOUS
  Filled 2023-12-29 (×4): qty 10

## 2023-12-29 MED ORDER — SODIUM CHLORIDE 0.9% IV SOLUTION: Freq: Once | INTRAVENOUS | Status: DC

## 2023-12-29 MED ORDER — HEPARIN (PORCINE) 25000 UT/250ML-% IV SOLN
850.0000 [IU]/h | INTRAVENOUS | Status: DC
  Administered 2023-12-29: 850 [IU]/h via INTRAVENOUS
  Filled 2023-12-29: qty 250

## 2023-12-29 MED ORDER — PANTOPRAZOLE SODIUM 40 MG IV SOLR
40.0000 mg | Freq: Once | INTRAVENOUS | Status: AC
  Administered 2023-12-29: 40 mg via INTRAVENOUS
  Filled 2023-12-29: qty 10

## 2023-12-29 NOTE — Progress Notes (Signed)
 ANTICOAGULATION CONSULT NOTE  Pharmacy Consult for Heparin  Indication: chest pain/ACS  Allergies  Allergen Reactions   Amlodipine      Cramps    Codeine     Headaches    Gabapentin      headache   Prozac [Fluoxetine Hcl] Other (See Comments)    Suicidal ideations    Patient Measurements: Height: 5' 3 (160 cm) Weight: 80.3 kg (177 lb) IBW/kg (Calculated) : 52.4 Heparin  Dosing Weight: 69.9 kg  Vital Signs: Temp: 97.5 F (36.4 C) (09/16 1858) Temp Source: Oral (09/16 1858) BP: 95/67 (09/16 1945) Pulse Rate: 95 (09/16 1945)  Labs: Recent Labs    12/29/23 1901 12/29/23 1910  HGB 9.4* 10.2*  HCT 31.7* 30.0*  PLT 269  --   LABPROT 14.5  --   INR 1.1  --   CREATININE 0.86 0.80  TROPONINIHS 156*  --     Estimated Creatinine Clearance: 67.6 mL/min (by C-G formula based on SCr of 0.8 mg/dL).   Medical History: Past Medical History:  Diagnosis Date   Abnormal vaginal bleeding    Anxiety    Asthma    Barrett's esophagus    with high grade dysplasia per endoscopy 05/2005 // followed by Dr. Lupita Commander ([facility])   Chronic back pain    Complication of anesthesia    per patient she went in respiratory failure in recovery room in 2017   COPD (chronic obstructive pulmonary disease) (HCC)    Diabetes mellitus without complication (HCC)    type 2   Duodenal ulcer disease    thought to be contributed at least partly by overuse of NSAIDs and salicylates (goody powdr)   Endometrial polyp 03/2003   s/p resection in 03/2003. 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   Migraine    Nevus    Personal history of failed conscious sedation 08/11/2011   Sleep apnea    does not use cpap   Stroke (HCC) 10/2000   Tachycardia    Thyroid  disease    Tobacco abuse 04/2012   quit    Medications:  (Not in a hospital admission)  Scheduled:  Infusions:  PRN:   Assessment: 69 yof with a history of recent cardiac cath presenting with chest pain. Heparin  per pharmacy consult placed for chest pain/ACS.  Patient is not on anticoagulation prior to arrival.  Hgb 10.2; plt 269  Goal of Therapy:  Heparin  level 0.3-0.7 units/ml Monitor platelets by anticoagulation protocol: Yes   Plan:  Give IV heparin  4000 units bolus x 1 Start heparin  infusion at 850 units/hr Check anti-Xa level in 8 hours and daily while on heparin  Continue to monitor H&H and platelets  Dorn Buttner, PharmD, BCPS 12/29/2023 8:31 PM ED Clinical Pharmacist -  [phone]

## 2023-12-29 NOTE — Telephone Encounter (Signed)
 Make sense to go to the to be evaluated.  Alm Clay, MD

## 2023-12-29 NOTE — ED Provider Notes (Signed)
 [facility] EM[facility] DEPARTMENT AT [facility] Provider Note   CSN: [id number] Arrival date & time: 12/29/23  1850     Patient presents with: Chest Pain   Grace [name] is a 68 y.o. female.   Patient with history of COPD, Barretts esophagus, diabetes, diabetes, GERD, CVA, hypertension, hyperlipidemia presents today with complaints of chest pain. Reports that her pain began on Saturday and has been persistent and worsening since then. Reports that her pain is substernal in nature and radiates to her left side.  Pain feels like squeezing.  Denies history of similar pain previously.  Does report that she had a cardiac stent placed on 9/10 after she was found to have a coronary calcium  score in the 99th percentile. She did not have an MI but does report that she was having pain before she had the stent placed. Reports that her pain feels similar in nature to the pain she was having before she had her stent placed but is worse. Reports she is having nausea as well. Pain is worse with exertion and relieved with rest. Reports compliance with her Plavix  at home. Additionally patient denies hematemesis, hematochezia, or melena.  The history is provided by the patient. No language interpreter was used.  Chest Pain Associated symptoms: abdominal pain and nausea        Prior to Admission medications   Medication Sig Start Date End Date Taking? Authorizing Provider  albuterol  (PROAIR  HFA) 108 (90 Base) MCG/ACT inhaler INHALE 2 PUFFS EVERY 6  HOURS AS NEEDED FOR  SHORTNESS OF BREATH OR  WHEEZING 05/17/19   Caro Harlene POUR, NP  aspirin  81 MG chewable tablet Chew 81 mg by mouth daily as needed (chest pain).    [provider]  bisoprolol  (ZEBETA ) 10 MG tablet Take 1 & 1/2  tablets (15 mg total) by mouth daily. 12/08/23   Daneen Damien BROCKS, NP  calcium  carbonate (TUMS - DOSED IN MG ELEMENTAL CALCIUM ) 500 MG chewable tablet Chew 1 tablet by mouth daily as needed for indigestion or  heartburn.    [provider]  clopidogrel  (PLAVIX ) 75 MG tablet Take 1 tablet (75 mg total) by mouth daily with breakfast. 12/25/23   Henry Shaver B, NP  diphenhydrAMINE  (BENADRYL ) 25 MG tablet Take 50 mg by mouth at bedtime as needed for sleep.    [provider]  docusate sodium  (COLACE) 100 MG capsule Take 300 mg by mouth daily.    [provider]  estradiol  (ESTRACE  VAGINAL) 0.1 MG/GM vaginal cream Place 1 Applicatorful vaginally at bedtime. Patient taking differently: Place 1 Applicatorful vaginally 2 (two) times a week. 08/19/19   Caro Harlene POUR, NP  ezetimibe  (ZETIA ) 10 MG tablet Take 10 mg by mouth daily. 09/29/22   [provider]  fluticasone  (FLONASE ) 50 MCG/ACT nasal spray USE 2 SPRAYS IN BOTH NOSTRILS  DAILY 09/29/23   Cobb, Comer GAILS, NP  furosemide  (LASIX ) 40 MG tablet TAKE 1 TABLET BY MOUTH DAILY AS  NEEDED FOR WEIGHT GAIN OF 3 LB  OVER AT NIGHT OR 5 LB IN 1 WEEK 08/31/23   Monge, Damien BROCKS, NP  glucose blood (ONETOUCH VERIO) test strip Use to check blood sugar twice daily. Dx: E11.9 08/25/18   Caro Harlene POUR, NP  hydrALAZINE  (APRESOLINE ) 50 MG tablet Take 1 tablet (50 mg total) by mouth 3 (three) times daily. May take an additional 50 mg if needed for blood pressureif still elevated is greater than or equal to 160 systolic after 1  hour recheck of blood pressure 08/27/23   Anner Alm ORN, MD  ipratropium-albuterol  (DUONEB) 0.5-2.5 (3) MG/3ML SOLN Take 3 mLs by nebulization every 6 (six) hours as needed. 02/07/19   Kassie Acquanetta Bradley, MD  isosorbide  mononitrate (IMDUR ) 30 MG 24 hr tablet Take 3 tablets (90 mg total) by mouth daily. 10/07/23   Anner Alm ORN, MD  Lancets [facility] DELICA PLUS [place]) MISC USE TO CHECK BLOOD SUGAR  TWICE DAILY 04/01/19   Eubanks, Jessica K, NP  levothyroxine  (SYNTHROID ) 200 MCG tablet Take 200 mcg by mouth at bedtime.    [provider]  losartan  (COZAAR ) 100 MG tablet TAKE 1 TABLET BY MOUTH   DAILY Patient taking differently: Take 100 mg by mouth at bedtime. 07/20/19   Eubanks, Jessica K, NP  nitroGLYCERIN  (NITROSTAT ) 0.4 MG SL tablet Place 0.4 mg under the tongue every 5 (five) minutes as needed for chest pain. 08/23/23   [provider]  OZEMPIC, 0.25 OR 0.5 MG/DOSE, 2 MG/3ML SOPN Inject 0.5 mg into the skin once a week. 08/30/23   [provider]  pantoprazole  (PROTONIX ) 40 MG tablet Take 1 tablet (40 mg total) by mouth daily. 12/24/23   Henry Manuelita NOVAK, NP  pregabalin  (LYRICA ) 50 MG capsule Take 50 mg by mouth daily. 10/13/22   [provider]  REPATHA  SURECLICK 140 MG/ML SOAJ INJECT 1 PEN SUBCUTANEOUSLY  EVERY 2 WEEKS 02/20/23   Anner Alm ORN, MD  Respiratory Therapy Supplies (NEBULIZER/TUBING/MOUTHPIECE) KIT 1 application by Does not apply route every 6 (six) hours as needed. FAX TO [phone] 07/19/19   Eubanks, Jessica K, NP  traMADol  (ULTRAM ) 50 MG tablet Take 50 mg by mouth every 6 (six) hours as needed for moderate pain (pain score 4-6) or severe pain (pain score 7-10). 03/16/23   [provider]  valACYclovir  (VALTREX ) 500 MG tablet TAKE 1 TABLET BY MOUTH  DAILY 11/23/19   Eubanks, Jessica K, NP  Vibegron  75 MG TABS Take 1 tablet (75 mg total) by mouth daily. 07/24/23   Zuleta, Kaitlin G, NP  ipratropium (ATROVENT  HFA) 17 MCG/ACT inhaler Inhale 2 puffs into the lungs 4 (four) times daily. 04/25/11 06/30/11  Rulon Adine ORN, MD    Allergies: Amlodipine , Codeine, Gabapentin , and Prozac [fluoxetine hcl]    Review of Systems  Cardiovascular:  Positive for chest pain.  Gastrointestinal:  Positive for abdominal pain and nausea.  All other systems reviewed and are negative.   Updated Vital Signs BP 95/67   Pulse 95   Temp (!) 97.5 F (36.4 C) (Oral)   Resp 10   Ht 5' 3 (1.6 m)   Wt 80.3 kg   LMP 02/27/2014   SpO2 94%   BMI 31.35 kg/m   Physical Exam Vitals and nursing note reviewed.  Constitutional:      General: She is not in  acute distress.    Appearance: Normal appearance. She is normal weight. She is not ill-appearing, toxic-appearing or diaphoretic.  HENT:     Head: Normocephalic and atraumatic.  Cardiovascular:     Rate and Rhythm: Normal rate and regular rhythm.     Pulses:          Dorsalis pedis pulses are 2+ on the right side and 2+ on the left side.     Heart sounds: Normal heart sounds.  Pulmonary:     Effort: Pulmonary effort is normal. No respiratory distress.     Breath sounds: Normal breath sounds.  Abdominal:     Palpations: Abdomen  is soft.     Tenderness: There is abdominal tenderness.  Genitourinary:    Comments: Melanous stool on exam Musculoskeletal:        General: Normal range of motion.     Cervical back: Normal range of motion.     Right lower leg: No tenderness. No edema.     Left lower leg: No tenderness. No edema.  Skin:    General: Skin is warm and dry.  Neurological:     General: No focal deficit present.     Mental Status: She is alert.  Psychiatric:        Mood and Affect: Mood normal.        Behavior: Behavior normal.     (all labs ordered are listed, but only abnormal results are displayed) Labs Reviewed  CBC - Abnormal; Notable for the following components:      Result Value   RBC 3.18 (*)    Hemoglobin 9.4 (*)    HCT 31.7 (*)    MCHC 29.7 (*)    All other components within normal limits  I-STAT CHEM 8, ED - Abnormal; Notable for the following components:   Potassium 3.4 (*)    Glucose, Bld 153 (*)    Calcium , Ion 1.10 (*)    Hemoglobin 10.2 (*)    HCT 30.0 (*)    All other components within normal limits  PROTIME-INR  BASIC METABOLIC PANEL WITH GFR  TROPONIN I (HIGH SENSITIVITY)    EKG: EKG Interpretation Date/Time:  Tuesday December 29 2023 19:41:30 EDT Ventricular Rate:  96 PR Interval:  143 QRS Duration:  91 QT Interval:  365 QTC Calculation: 462 R Axis:   76  Text Interpretation: Normal sinus rhythm ST depression in II, III, aVF  Confirmed by Francesca Fallow ([id number]) on 12/29/2023 7:43:11 PM  Radiology: [facility] Chest 2 View Result Date: 12/29/2023 CLINICAL DATA:  chestpain EXAM: CHEST - 2 VIEW COMPARISON:  Chest x-ray 01/09/2023. FINDINGS: The heart and mediastinal contours are unchanged. Atherosclerotic plaque. No focal consolidation. No pulmonary edema. No pleural effusion. No pneumothorax. No acute osseous abnormality. IMPRESSION: 1. No active cardiopulmonary disease. 2.  Aortic Atherosclerosis (ICD10-I70.0). Electronically Signed   By: Morgane  Naveau M.D.   On: 12/29/2023 19:38     .Critical Care  Performed by: Nora Lauraine LABOR, PA-C Authorized by: Princes Finger A, PA-C   Critical care provider statement:    Critical care time (minutes):  100   Critical care was necessary to treat or prevent imminent or life-threatening deterioration of the following conditions:  Circulatory failure   Critical care was time spent personally by me on the following activities:  Development of treatment plan with patient or surrogate, discussions with consultants, discussions with primary provider, evaluation of patient's response to treatment, examination of patient, obtaining history from patient or surrogate, ordering and review of laboratory studies, ordering and review of radiographic studies, pulse oximetry, re-evaluation of patient's condition and review of old charts   Care discussed with: admitting provider      Medications Ordered in the ED  fentaNYL  (SUBLIMAZE ) injection 50 mcg (50 mcg Intravenous Given 12/29/23 1942)                                    Medical Decision Making Amount and/or Complexity of Data Reviewed Labs: ordered. Radiology: ordered.  Risk Prescription drug management. Decision regarding hospitalization.   This patient is a 68 y.o.  female who presents to the ED for concern of chest pain, this involves an extensive number of treatment options, and is a complaint that carries with it a high risk of  complications and morbidity. The emergent differential diagnosis prior to evaluation includes, but is not limited to,  ACS, pericarditis, myocarditis, aortic dissection, PE, pneumothorax, esophageal rupture, pneumonia, reflux/PUD, biliary disease, pancreatitis, costochondritis, anxiety  This is not an exhaustive differential.   Past Medical History / Co-morbidities / Social History:  has a past medical history of Abnormal vaginal bleeding, Anxiety, Asthma, Barrett's esophagus, Chronic back pain, Complication of anesthesia, COPD (chronic obstructive pulmonary disease) (HCC), Diabetes mellitus without complication (HCC), Duodenal ulcer disease, Endometrial polyp (03/2003), Family history of premature CAD (05/19/2018), Fibroid uterus, GERD (gastroesophageal reflux disease), Hiatal hernia, History of CVA (cerebrovascular accident) (10/2000), History of pineal cyst, HSV infection, Hyperlipidemia, Hypertension, Hypothyroidism, Internal hemorrhoids, Migraine, Nevus, Personal history of failed conscious sedation (08/11/2011), Sleep apnea, Stroke (HCC) (10/2000), Tachycardia, Thyroid  disease, and Tobacco abuse (04/2012).  Additional history: Chart reviewed. Pertinent results include: Had RCA stent placed by cardiology Dr. Anner on 9/10, placed on Plavix .   Additionally, of note patient was admitted in November 2024 for symptomatic anemia, found to have Barrett's esophagus and a duodenal stricture.  Physical Exam: Physical exam performed. The pertinent findings include: generally unwell appearing, melanous stool on exam  Lab Tests: I ordered, and personally interpreted labs.  The pertinent results include:  hgb 9.4 (down from 12.7 5 days ago), troponin 156 --> 185. Hemoccult positive   Imaging Studies: I ordered imaging studies including CXR. I independently visualized and interpreted imaging which showed   1. No active cardiopulmonary disease. 2.  Aortic Atherosclerosis   I agree with the  radiologist interpretation.   Cardiac Monitoring:  The patient was maintained on a cardiac monitor.  My attending physician Dr. Francesca viewed and interpreted the cardiac monitored which showed an underlying rhythm of:  Normal sinus rhythm New ST depression in II, III, aVF I agree with this interpretation.   Medications: I ordered medication including fluids for dehydration, heparin  initially with concern for ACS, however discontinued after hemoccult positive with hgb drop. Given protonix  for GI bleed and fentanyl  for pain. Reevaluation of the patient after these medicines showed that the patient improved. I have reviewed the patients home medicines and have made adjustments as needed.  Consultations Obtained: I requested consultation with the cardiology on call Dr. Duffy,  and discussed lab and imaging findings as well as pertinent plan - they recommend: they have seen and evaluated the patient, have concern for demand ischemia with GI bleed. They recommend hospitalist admission and they will see the patient in consultation. Recommend holding heparin  for now given GI bleed.  Messaged GI Dr. Burnette to see patient in consultation tomorrow   Disposition: After consideration of the diagnostic results and the patients response to treatment, I feel that patient will require admission for GI bleed with elevated troponin and EKG changes with chest pain. Discussed same with patient who is understanding and in agreement .   Discussed patient with hospitalist Dr. Segars who accepts patient for admission. Requests formal GI consultation.  This is a shared visit with supervising physician Dr. Francesca who has independently evaluated patient & provided guidance in evaluation/management/disposition, in agreement with care   Final diagnoses:  Melena  Chest pain, unspecified type  Elevated troponin    ED Discharge Orders     None          Briena Swingler,  Lauraine LABOR, PA-C 01/01/24 1311    Francesca Elsie CROME, MD 01/06/24 [phone]

## 2023-12-29 NOTE — ED Triage Notes (Signed)
 Patient arrives via [facility] for chest pain that started on Saturday. Consistent since. 3 cardiac stents Wednesday. On plavix . Associated shob. Lung sounds clear and equal. Non radiating CP, no alleviation/aggravation. Patient took 324 ASA at home. No nitroglycerin  given due to BP. 100 HR, 99 O2 L, 90 on room air. 102/54. 200 cc given after BP 82 palp. CBG 106. 20 L wrist.

## 2023-12-29 NOTE — Progress Notes (Signed)
 Brief TRH note:   Patient with history of recent RCA stent on 9/10 who is on DAPT and will need to remain on uninterrupted antiplatelet therapy, has presented with new onset of chest pain, SOB. EKG demonstrates inferior ST depression. HS trop elevated and uptrending 156 -> 185. She was initially started on heparin  but noted recent drop in Hb 12.7 on 9/11 -> 9.4 on initial labs, and downtrending to 8.3 over the course of 3 hrs in the ED. She is having frank melena. Concern for active upper GI bleeding with developing hemorrhagic shock with worsening tachycardia.   Brief plan:  -- I have ordered for 2 U of emergency release blood, type and screen, and serial H/H q 4 hours.  -- EDP had messaged GI, requested that they call and speak to them over the phone, concern she will need urgent endoscopy considering we will need to continue DAPT + worsening clinical status.  -- Cardiology has seen and evaluated the patient  -- Depending on her course she may end up needing ICU level of care. I will evaluate her in person as soon as I am able, if any further decompensation I would call PCCM in meantime   Update:  -- I spoke with Dr. Burnette of GI and expressed my concern that this patient may need urgent endoscopy. He felt that at this time would not be able to do endoscopy unless patient was off of Plavix  for at least 3 days. I conveyed that she just had a stent and would need uninterrupted antiplatelet therapy, and he recommended for cardiology to weigh in on antiplatelet options in this setting. He recommends for resuscitation, PPI IV, NPO, and GI consultation in the morning.  -- I have spoken with cardiology Dr. Duffy and will connect cardiology and GI  -- Considering there is no immediate plan for endoscopy needs a CTA bleeding study, I have discussed with the EDP who will order this.  -- I have recommended for PCCM evaluation, since she has active bleeding and hemodynamic change and will need to remain on  antiplatelet therapy I believe she needs higher level of care for monitoring and capability to perform emergent endoscopy if this were to be required    Dorn Dawson, MD  [facility]

## 2023-12-29 NOTE — Consult Note (Addendum)
 Cardiology Consult Note:   Patient ID: Grace [name] MRN: [id number]; DOB: 11-27-1955   Admission date: 12/29/2023  Primary Care Provider: Delores Rojelio Caldron, NP Primary Cardiologist: Grace Clay, MD  Primary Electrophysiologist:  None    Patient Profile:   Grace [name] is a 68 y.o. female with CAD status post RCA PCI on 12/23/2023.,  Hypertension hyperlipidemia hypothyroidism diabetes  History of Present Illness:   [name] states that she was discharged this week after her RCA stent on Thursday and beginning on Saturday she began to develop chest pain, nausea, decreased appetite. Since that day she has been eating Jell-O and drinking water . She has not had a full meal.  The type of pain that she is experiencing today is different than the pain that she experienced prior to her PCI last week.  She states a lot last week before the stent she had left-sided chest pain.  However, today, this pain is more mid chest upper stomach area. She has had pain like this in the past but is unable to determine when exactly that pain was. Today she began vomiting and called the cardiology office and they recommended her coming to the emergency department for evaluation.   Past Medical History:  Diagnosis Date   Abnormal vaginal bleeding    Anxiety    Asthma    Barrett's esophagus    with high grade dysplasia per endoscopy 05/2005 // followed by Dr. Lupita Commander ([facility])   Chronic back pain    Complication of anesthesia    per patient she went in respiratory failure in recovery room in 2017   COPD (chronic obstructive pulmonary disease) (HCC)    Diabetes mellitus without complication (HCC)    type 2   Duodenal ulcer disease    thought to be contributed at least partly by overuse of NSAIDs and salicylates (goody powdr)   Endometrial polyp 03/2003   s/p resection in 03/2003. Path showing submucosal leiomyoma and benign  proliferative type endometrium   Family history of premature CAD  05/19/2018   Fibroid uterus    s/p myomectomy x 2   GERD (gastroesophageal reflux disease)    Hiatal hernia    History of CVA (cerebrovascular accident) 10/2000   History of pineal cyst    11 mm cystic mass noted in the pineal gland per MRI in 2001 - most consitent with simple pineal cyst   HSV infection    Hyperlipidemia    Hypertension    Hypothyroidism    with hx of multinodular goiter (noted on neck US  in 11/2002 - showing diffuse nodularity and inhomogenous texture diffusely BL)   Internal hemorrhoids    grade 2 per colonoscopy in 09/2006 - repeat colonoscopy rec in 5-10 years.   Migraine    Nevus    Personal history of failed conscious sedation 08/11/2011   Sleep apnea    does not use cpap   Stroke (HCC) 10/2000   Tachycardia    Thyroid  disease    Tobacco abuse 04/2012   quit    Past Surgical History:  Procedure Laterality Date   ADENOIDECTOMY     APPENDECTOMY  2017   BALLOON DILATION N/A 06/14/2020   Procedure: BALLOON DILATION;  Surgeon: Wilhelmenia Aloha Raddle., MD;  Location: [facility] ENDOSCOPY;  Service: Gastroenterology;  Laterality: N/A;   BIOPSY  02/15/2020   Procedure: BIOPSY;  Surgeon: Burnette Fallow, MD;  Location: [facility] ENDOSCOPY;  Service: Endoscopy;;   BIOPSY  06/14/2020   Procedure: BIOPSY;  Surgeon:  Mansouraty, Aloha Raddle., MD;  Location: [facility] ENDOSCOPY;  Service: Gastroenterology;;   BIOPSY  03/02/2023   Procedure: BIOPSY;  Surgeon: Saintclair Jasper, MD;  Location: [facility] ENDOSCOPY;  Service: Gastroenterology;;   BRONCHIAL BIOPSY  03/15/2020   Procedure: BRONCHIAL BIOPSIES;  Surgeon: Brenna Adine CROME, DO;  Location: [facility] ENDOSCOPY;  Service: Pulmonary;;   BRONCHIAL BRUSHINGS  03/15/2020   Procedure: BRONCHIAL BRUSHINGS;  Surgeon: Brenna Adine CROME, DO;  Location: [facility] ENDOSCOPY;  Service: Pulmonary;;   BRONCHIAL NEEDLE ASPIRATION BIOPSY  03/15/2020   Procedure: BRONCHIAL NEEDLE ASPIRATION BIOPSIES;  Surgeon: Brenna Adine CROME, DO;  Location: [facility] ENDOSCOPY;  Service: Pulmonary;;    BRONCHIAL WASHINGS  03/15/2020   Procedure: BRONCHIAL WASHINGS;  Surgeon: Brenna Adine CROME, DO;  Location: [facility] ENDOSCOPY;  Service: Pulmonary;;   CHOLECYSTECTOMY     COLONOSCOPY  02/07/2010   COLONOSCOPY WITH PROPOFOL  N/A 02/15/2020   Procedure: COLONOSCOPY WITH PROPOFOL ;  Surgeon: Burnette Fallow, MD;  Location: [facility] ENDOSCOPY;  Service: Endoscopy;  Laterality: N/A;   CORONARY IMAGING/OCT N/A 12/23/2023   Procedure: CORONARY IMAGING/OCT;  Surgeon: Anner Grace ORN, MD;  Location: [facility] INVASIVE CV LAB;  Service: Cardiovascular;  Laterality: N/A;   CORONARY STENT INTERVENTION N/A 12/23/2023   Procedure: CORONARY STENT INTERVENTION;  Surgeon: Anner Grace ORN, MD;  Location: [facility] INVASIVE CV LAB;  Service: Cardiovascular;  Laterality: N/A;   DILATION AND CURETTAGE OF UTERUS     DILATION AND CURETTAGE OF UTERUS N/A 10/19/2023   Procedure: DILATION AND CURETTAGE;  Surgeon: Jeralyn Crutch, MD;  Location: [facility] OR;  Service: Gynecology;  Laterality: N/A;  ANESTHESIA REQUEST TO BE UNDER IV CONSCIOUS SEDATION LIKE FOR A COLONOSCOPY IF POSSIBLE   ENDOSCOPIC MUCOSAL RESECTION N/A 06/14/2020   Procedure: ENDOSCOPIC MUCOSAL RESECTION;  Surgeon: Wilhelmenia Aloha Raddle., MD;  Location: [facility] ENDOSCOPY;  Service: Gastroenterology;  Laterality: N/A;   ESOPHAGOGASTRODUODENOSCOPY (EGD) WITH PROPOFOL  N/A 02/15/2020   Procedure: ESOPHAGOGASTRODUODENOSCOPY (EGD) WITH PROPOFOL ;  Surgeon: Burnette Fallow, MD;  Location: [facility] ENDOSCOPY;  Service: Endoscopy;  Laterality: N/A;   ESOPHAGOGASTRODUODENOSCOPY (EGD) WITH PROPOFOL  N/A 06/14/2020   Procedure: ESOPHAGOGASTRODUODENOSCOPY (EGD) WITH PROPOFOL ;  Surgeon: Wilhelmenia Aloha Raddle., MD;  Location: [facility] ENDOSCOPY;  Service: Gastroenterology;  Laterality: N/A;   ESOPHAGOGASTRODUODENOSCOPY (EGD) WITH PROPOFOL  Left 03/02/2023   Procedure: ESOPHAGOGASTRODUODENOSCOPY (EGD) WITH PROPOFOL ;  Surgeon: Saintclair Jasper, MD;  Location: [facility] ENDOSCOPY;  Service: Gastroenterology;  Laterality: Left;   FOREIGN BODY  REMOVAL  06/14/2020   Procedure: FOREIGN BODY REMOVAL;  Surgeon: Wilhelmenia Aloha Raddle., MD;  Location: [facility] ENDOSCOPY;  Service: Gastroenterology;;   HYSTEROSCOPY     LAPAROSCOPIC APPENDECTOMY N/A 09/23/2015   Procedure: APPENDECTOMY LAPAROSCOPIC;  Surgeon: Dann Hummer, MD;  Location: [facility] OR;  Service: General;  Laterality: N/A;   LEFT HEART CATH AND CORONARY ANGIOGRAPHY N/A 12/23/2023   Procedure: LEFT HEART CATH AND CORONARY ANGIOGRAPHY;  Surgeon: Anner Grace ORN, MD;  Location: [facility] INVASIVE CV LAB;  Service: Cardiovascular;  Laterality: N/A;   MYOMECTOMY  1990, 1997   x 2 - In 1997, noted to have extensive pelvic adhesions and BL tubal obstruction   POLYPECTOMY  02/15/2020   Procedure: POLYPECTOMY;  Surgeon: Burnette Fallow, MD;  Location: [facility] ENDOSCOPY;  Service: Endoscopy;;   TONSILLECTOMY     TRANSTHORACIC ECHOCARDIOGRAM  06/2014    EF 55-60%.  No RWMA. Gr 1 DD. Cannot R/o PFO (consider bubble study).   UPPER ESOPHAGEAL ENDOSCOPIC ULTRASOUND (EUS) N/A 06/14/2020   Procedure: UPPER ESOPHAGEAL ENDOSCOPIC ULTRASOUND (EUS);  Surgeon: Wilhelmenia Aloha Raddle., MD;  Location: [facility] ENDOSCOPY;  Service: Gastroenterology;  Laterality: N/A;   UPPER GASTROINTESTINAL ENDOSCOPY  12/18/2009   VIDEO BRONCHOSCOPY WITH ENDOBRONCHIAL NAVIGATION N/A 03/15/2020   Procedure: VIDEO BRONCHOSCOPY WITH ENDOBRONCHIAL NAVIGATION;  Surgeon: Brenna Adine CROME, DO;  Location: [facility] ENDOSCOPY;  Service: Pulmonary;  Laterality: N/A;   VIDEO BRONCHOSCOPY WITH ENDOBRONCHIAL ULTRASOUND  03/15/2020   Procedure: VIDEO BRONCHOSCOPY WITH ENDOBRONCHIAL ULTRASOUND;  Surgeon: Brenna Adine CROME, DO;  Location: [facility] ENDOSCOPY;  Service: Pulmonary;;   WISDOM TOOTH EXTRACTION       Medications Prior to Admission: Prior to Admission medications   Medication Sig Start Date End Date Taking? Authorizing Provider  albuterol  (PROAIR  HFA) 108 (90 Base) MCG/ACT inhaler INHALE 2 PUFFS EVERY 6  HOURS AS NEEDED FOR  SHORTNESS OF BREATH OR  WHEEZING 05/17/19    Caro Harlene POUR, NP  aspirin  81 MG chewable tablet Chew 81 mg by mouth daily as needed (chest pain).    [provider]  bisoprolol  (ZEBETA ) 10 MG tablet Take 1 & 1/2  tablets (15 mg total) by mouth daily. 12/08/23   Daneen Damien BROCKS, NP  calcium  carbonate (TUMS - DOSED IN MG ELEMENTAL CALCIUM ) 500 MG chewable tablet Chew 1 tablet by mouth daily as needed for indigestion or heartburn.    [provider]  clopidogrel  (PLAVIX ) 75 MG tablet Take 1 tablet (75 mg total) by mouth daily with breakfast. 12/25/23   Henry Shaver B, NP  diphenhydrAMINE  (BENADRYL ) 25 MG tablet Take 50 mg by mouth at bedtime as needed for sleep.    [provider]  docusate sodium  (COLACE) 100 MG capsule Take 300 mg by mouth daily.    [provider]  estradiol  (ESTRACE  VAGINAL) 0.1 MG/GM vaginal cream Place 1 Applicatorful vaginally at bedtime. Patient taking differently: Place 1 Applicatorful vaginally 2 (two) times a week. 08/19/19   Caro Harlene POUR, NP  ezetimibe  (ZETIA ) 10 MG tablet Take 10 mg by mouth daily. 09/29/22   [provider]  fluticasone  (FLONASE ) 50 MCG/ACT nasal spray USE 2 SPRAYS IN BOTH NOSTRILS  DAILY 09/29/23   Cobb, Comer GAILS, NP  furosemide  (LASIX ) 40 MG tablet TAKE 1 TABLET BY MOUTH DAILY AS  NEEDED FOR WEIGHT GAIN OF 3 LB  OVER AT NIGHT OR 5 LB IN 1 WEEK 08/31/23   Monge, Damien BROCKS, NP  glucose blood (ONETOUCH VERIO) test strip Use to check blood sugar twice daily. Dx: E11.9 08/25/18   Caro Harlene POUR, NP  hydrALAZINE  (APRESOLINE ) 50 MG tablet Take 1 tablet (50 mg total) by mouth 3 (three) times daily. May take an additional 50 mg if needed for blood pressureif still elevated is greater than or equal to 160 systolic after 1 hour recheck of blood pressure 08/27/23   Anner Grace ORN, MD  ipratropium-albuterol  (DUONEB) 0.5-2.5 (3) MG/3ML SOLN Take 3 mLs by nebulization every 6 (six) hours as needed. 02/07/19   Kassie Acquanetta Bradley, MD  isosorbide  mononitrate  (IMDUR ) 30 MG 24 hr tablet Take 3 tablets (90 mg total) by mouth daily. 10/07/23   Anner Grace ORN, MD  Lancets [facility] DELICA PLUS [place]) MISC USE TO CHECK BLOOD SUGAR  TWICE DAILY 04/01/19   Eubanks, Jessica K, NP  levothyroxine  (SYNTHROID ) 200 MCG tablet Take 200 mcg by mouth at bedtime.    [provider]  losartan  (COZAAR ) 100 MG tablet TAKE 1 TABLET BY MOUTH  DAILY Patient taking differently: Take 100 mg by mouth at bedtime. 07/20/19   Caro Harlene POUR, NP  nitroGLYCERIN  (NITROSTAT ) 0.4 MG SL tablet  Place 0.4 mg under the tongue every 5 (five) minutes as needed for chest pain. 08/23/23   [provider]  OZEMPIC, 0.25 OR 0.5 MG/DOSE, 2 MG/3ML SOPN Inject 0.5 mg into the skin once a week. 08/30/23   [provider]  pantoprazole  (PROTONIX ) 40 MG tablet Take 1 tablet (40 mg total) by mouth daily. 12/24/23   Henry Manuelita NOVAK, NP  pregabalin  (LYRICA ) 50 MG capsule Take 50 mg by mouth daily. 10/13/22   [provider]  REPATHA  SURECLICK 140 MG/ML SOAJ INJECT 1 PEN SUBCUTANEOUSLY  EVERY 2 WEEKS 02/20/23   Anner Grace ORN, MD  Respiratory Therapy Supplies (NEBULIZER/TUBING/MOUTHPIECE) KIT 1 application by Does not apply route every 6 (six) hours as needed. FAX TO [phone] 07/19/19   Eubanks, Jessica K, NP  traMADol  (ULTRAM ) 50 MG tablet Take 50 mg by mouth every 6 (six) hours as needed for moderate pain (pain score 4-6) or severe pain (pain score 7-10). 03/16/23   [provider]  valACYclovir  (VALTREX ) 500 MG tablet TAKE 1 TABLET BY MOUTH  DAILY 11/23/19   Eubanks, Jessica K, NP  Vibegron  75 MG TABS Take 1 tablet (75 mg total) by mouth daily. 07/24/23   Zuleta, Kaitlin G, NP  ipratropium (ATROVENT  HFA) 17 MCG/ACT inhaler Inhale 2 puffs into the lungs 4 (four) times daily. 04/25/11 06/30/11  Rulon Adine ORN, MD     Allergies:    Allergies  Allergen Reactions   Amlodipine      Cramps    Codeine     Headaches    Gabapentin      headache   Prozac  [Fluoxetine Hcl] Other (See Comments)    Suicidal ideations    Social History:   Social History   Socioeconomic History   Marital status: Divorced    Spouse name: Not on file   Number of children: 0   Years of education: 12   Highest education level: High school graduate  Occupational History   Occupation: Electronic record    Comment: [employer]  Tobacco Use   Smoking status: Former    Current packs/day: 0.00    Average packs/day: 3.0 packs/day for 50.0 years (150.0 ttl pk-yrs)    Types: Cigarettes    Start date: 09/22/1965    Quit date: 09/23/2015    Years since quitting: 8.2   Smokeless tobacco: Never   Tobacco comments:    Counseling sheet 07-2011   Vaping Use   Vaping status: Never Used  Substance and Sexual Activity   Alcohol use: No    Alcohol/week: 0.0 standard drinks of alcohol   Drug use: No   Sexual activity: Yes    Birth control/protection: Post-menopausal  Other Topics Concern   Not on file  Social History Narrative   Financial assistance approved for 100% discount at [facility] and has [facility] card per [employer]   01/15/2010      Diet- N/A   Caffeine- Goody powders   Married-Divorced   [employer] and lives with mother   Pets-2   Current/past profession-[employer]   Exercise-No   Living will-No   DNR-No   POA/HPOA-No         Social Drivers of Health   Financial Resource Strain: Medium Risk (02/23/2018)   Overall Financial Resource Strain (CARDIA)    Difficulty of Paying Living Expenses: Somewhat hard  Food Insecurity: No Food Insecurity (12/23/2023)   Hunger Vital Sign    Worried About Running Out of Food in the Last Year: Never true    Ran  Out of Food in the Last Year: Never true  Transportation Needs: No Transportation Needs (12/23/2023)   PRAPARE - Administrator, Civil Service (Medical): No    Lack of Transportation (Non-Medical): No  Physical Activity: Inactive (02/23/2018)   Exercise Vital Sign    Days of Exercise per Week: 0 days     Minutes of Exercise per Session: 0 min  Stress: Stress Concern Present (02/23/2018)   [employer] of Occupational Health - Occupational Stress Questionnaire    Feeling of Stress : Rather much  Social Connections: Moderately Integrated (12/23/2023)   Social Connection and Isolation Panel    Frequency of Communication with Friends and Family: Twice a week    Frequency of Social Gatherings with Friends and Family: Twice a week    Attends Religious Services: More than 4 times per year    Active Member of [employer] or Organizations: Yes    Attends Engineer, structural: More than 4 times per year    Marital Status: Divorced  Intimate Partner Violence: Not At Risk (12/23/2023)   Humiliation, Afraid, Rape, and Kick questionnaire    Fear of Current or Ex-Partner: No    Emotionally Abused: No    Physically Abused: No    Sexually Abused: No    Family History:   The patient's family history includes COPD in her father and mother; Heart disease (age of onset: 81) in her father. There is no history of Colon cancer, Esophageal cancer, Inflammatory bowel disease, Liver disease, Pancreatic cancer, Rectal cancer, Stomach cancer, or Thyroid  disease.    Review of Systems: [y] = yes, [ ]  = no    General: Weight gain [ ] ; Weight loss [ ] ; Anorexia [ ] ; Fatigue [ ] ; Fever [ ] ; Chills [ ] ; Weakness [ ]   Cardiac: Chest pain/pressure [ y]; Resting SOB [ y]; Exertional SOB [ ] ; Orthopnea [ ] ; Pedal Edema [ ] ; Palpitations [ ] ; Syncope [ ] ; Presyncope [ ] ; Paroxysmal nocturnal dyspnea[ ]   Pulmonary: Cough [ ] ; Wheezing[ ] ; Hemoptysis[ ] ; Sputum [ ] ; Snoring [ ]   GI: Vomiting[ y]; Dysphagia[ ] ; Melena[y ]; Hematochezia [ ] ; Heartburn[ ] ; Abdominal pain [ ] ; Constipation [ ] ; Diarrhea [ ] ; BRBPR [ ]   GU: Hematuria[ ] ; Dysuria [ ] ; Nocturia[ ]   Vascular: Pain in legs with walking [ ] ; Pain in feet with lying flat [ ] ; Non-healing sores [ ] ; Stroke [ ] ; TIA [ ] ; Slurred speech [ ] ;  Neuro: Headaches[ ] ;  Vertigo[ ] ; Seizures[ ] ; Paresthesias[ ] ;Blurred vision [ ] ; Diplopia [ ] ; Vision changes [ ]   Ortho/Skin: Arthritis [ ] ; Joint pain [ ] ; Muscle pain [ ] ; Joint swelling [ ] ; Back Pain [ ] ; Rash [ ]   Psych: Depression[ ] ; Anxiety[ ]   Heme: Bleeding problems [ ] ; Clotting disorders [ ] ; Anemia [ ]   Endocrine: Diabetes [ ] ; Thyroid  dysfunction[ ]   Physical Exam/Data:   Vitals:   12/29/23 1900 12/29/23 1945 12/29/23 2100 12/29/23 2145  BP: 108/67 95/67 118/77 125/71  Pulse: 95 95 (!) 115 (!) 110  Resp: 16 10 15 14   Temp:      TempSrc:      SpO2: 99% 94% 93% 94%  Weight:      Height:        Intake/Output Summary (Last 24 hours) at 12/29/2023 2330 Last data filed at 12/29/2023 2248 Gross per 24 hour  Intake 1045.03 ml  Output --  Net 1045.03 ml   [employer]  12/29/23 1855  Weight: 80.3 kg   Body mass index is 31.35 kg/m.  General:  Well nourished, well developed, in no acute distress HEENT: normal Neck: no JVD Vascular: No carotid bruits; FA pulses 2+ bilaterally without bruits  Cardiac:  normal S1, S2; RRR; no murmur  Lungs:  clear to auscultation bilaterally, no wheezing, rhonchi or rales  Abd: soft,significant tenderness in the epigastic area  Ext: no edema Musculoskeletal:  No deformities Skin: decreased skin turgour Psych:  Normal affect    EKG:  Normal sinus rhythm with LVH present. ST depressions that are less than 1 mm are present in V5 V6 II, III, aVF. The ischemic changes are not seen on 12/25/2023  Relevant CV Studies:  12/23/2023 Prox RCA-1 lesion is 50% stenosed. Prox RCA-2 lesion is 80% stenosed.  Mid RCA lesion is 80% stenosed with 40% stenosed side branch in Acute Mrg.   Scoring balloon angioplasty was performed using a BALLOON SCOREFLEX 4.0X10 at both 80% lesions and into the percent stenosis.   Prox RCA-2 lesion is 80% stenosed.   A drug-eluting stent was successfully placed covering all 3 lesions, using a STENT ONYX FRONTIER 4.0X38 postdilated to  4.2 mm.  Post intervention, there is a 0-15% residual stenosis with TIMI-3 flow maintained however small sidebranch was temporarily occluded and recovered prior to completion of procedure.   Post intervention, there is a 0% residual stenosis.   The left ventricular systolic function is normal. The left ventricular ejection fraction is 55-65% by visual estimate.   LV end diastolic pressure is normal.   There is mild aortic valve stenosis. There is no aortic valve regurgitation.   Continue risk factor GDMT-is on Repatha  for lipids.  Blood pressure grossly well-controlled.   Recommend uninterrupted dual antiplatelet therapy with Aspirin  81mg  daily and Clopidogrel  75mg  daily for a minimum of 6 months (stable ischemic heart disease-Class I recommendation).    Laboratory Data:  Component     Latest Ref Rng 12/24/2023 12/29/2023  WBC     4.0 - 10.5 K/uL 6.7  8.9   RBC     3.87 - 5.11 MIL/uL 4.29  3.18 (L)   Hemoglobin     12.0 - 15.0 g/dL 87.2  8.3 (L)   Hemoglobin       10.2 (L)   Hemoglobin       9.4 (L)   HCT     36.0 - 46.0 % 40.2  27.4 (L)   HCT       30.0 (L)   HCT       31.7 (L)   MCV     80.0 - 100.0 fL 93.7  99.7   MCH     26.0 - 34.0 pg 29.6  29.6   MCHC     30.0 - 36.0 g/dL 68.3  70.2 (L)   RDW     11.5 - 15.5 % 13.2  13.5   Platelets     150 - 400 K/uL 246  269   MPV     7.5 - 12.5 fL    NEUT#     1.7 - 7.7 K/uL    Lymphs Abs     0.7 - 4.0 K/uL    Absolute Monocytes     200 - 950 cells/uL    Eosinophils Absolute     0.0 - 0.5 K/uL    Basophils Absolute     0.0 - 0.1 K/uL    Neutrophils     %  Total Lymphocyte     %    Monocytes Relative     %    Eosinophil     %    Basophil     %    Lymphocytes     %    Monocyte #     0.1 - 1.0 K/uL    nRBC     0.0 - 0.2 % 0.0  0.0   Immature Granulocytes     %    Abs Immature Granulocytes     0.00 - 0.07 K/uL    Sodium     135 - 145 mmol/L  141   Potassium     3.5 - 5.1 mmol/L  3.4 (L)   Chloride      98 - 111 mmol/L  101   BUN     8 - 23 mg/dL  21   Creatinine     9.55 - 1.00 mg/dL  9.19   Glucose     70 - 99 mg/dL  846 (H)   Calcium  Ionized     1.15 - 1.40 mmol/L  1.10 (L)   TCO2     22 - 32 mmol/L  26     Component     Latest Ref Rng 12/29/2023  Glucose     70 - 99 mg/dL 846 (H)   Glucose      150 (H)   BUN     8 - 23 mg/dL 21   BUN      20   Creatinine     0.44 - 1.00 mg/dL 9.19   Creatinine      0.86   Sodium     135 - 145 mmol/L 141   Sodium      140   Potassium     3.5 - 5.1 mmol/L 3.4 (L)   Potassium      3.5   Chloride     98 - 111 mmol/L 101   Chloride      102   CO2     22 - 32 mmol/L 26   Calcium      8.9 - 10.3 mg/dL 9.0   GFR, Estimated     >60 mL/min >60   Anion gap     5 - 15  12      Component     Latest Ref Rng 12/24/2023 12/29/2023  Hemoglobin     12.0 - 15.0 g/dL 87.2  8.3 (L)   Hemoglobin       10.2 (L)   Hemoglobin       9.4 (L)   HCT     36.0 - 46.0 % 40.2  27.4 (L)   HCT       30.0 (L)   HCT       31.7 (L)     Component     Latest Ref Rng 12/29/2023  Lactic Acid, Venous     0.5 - 1.9 mmol/L 1.2    Component     Latest Ref Rng 12/29/2023  Troponin I (High Sensitivity)     <18 ng/L 185 (HH)   Troponin I (High Sensitivity)      156 (HH)      Radiology/Studies:  DG Chest 2 View Result Date: 12/29/2023 CLINICAL DATA:  chestpain EXAM: CHEST - 2 VIEW COMPARISON:  Chest x-ray 01/09/2023. FINDINGS: The heart and mediastinal contours are unchanged. Atherosclerotic plaque. No focal consolidation. No pulmonary edema. No pleural effusion. No pneumothorax. No acute osseous abnormality. IMPRESSION:  1. No active cardiopulmonary disease. 2.  Aortic Atherosclerosis (ICD10-I70.0). Electronically Signed   By: Morgane  Naveau M.D.   On: 12/29/2023 19:38    Assessment and Plan:   Ms. Sammuel presents to the ED with nausea and what was initially described as chest pain.   After my evaluation, I became concerned of a GI etiology due to  her epigastric tenderness and 3 point hemoglobin drop in the last 5 days, a relative BUN elevation (6 >21),  and hypotension (baseline systolic blood pressure is 140-150 mmHg; presented with systolics in the 90s). It seemed like her predominant symptoms are epigastric pain (anginal symptoms prior to the [facility] were left sided chest pain- this is pain that is not currently experiencing), and nausea. Of note, she has a prior history of high grade barretts esophagus with her last endoscopy in Fall of 2024 and her initial presentation was a Hgb of 4.6.   Prior to my evaluation, she had been started on heparin  due to the concerns of chest pain and ST segment deviation. However, due to the above suspicion I ordered a hemoglobin and asked the ED team to discontinue to the heparin  if the hemoglobin returns lower as I do not think her coronaries are the main driver of her symptoms. I also asked for IV PPI to be started and a medicine admission. My initial suspicion is the rise in troponin with the EKG changes are related to demand ischemia from acute blood loss anemia in the setting of DAPT initiation. It is unclear to me why she has not had melena or any subjective signs of bleeding (LHC was radial approach).  - Repeat EKG once volume replete  - Transfuse for Hgb <7  - Monitor H&H  - Please consult GI for evaluation - Recent PCI in the last week; please page to discuss risk/benefits regarding DAPT if she is indeed bleeding with dropping hemoglobin - If she is not bleeding, we can consider an ECHO tomorrow, however I would work up non-cardiac causes first.    Signed, Merlene JAYSON Blood, MD  12/29/2023 11:30 PM   Merlene Blood, MD MS  Cardiology [facility]   =====  ADDENDUM 01:58 AM  Repeat Hgb 8.3. Heparin  was discontinued in the ED. I had a conversation with both the hospitalist and GI as there is increased concern for GI bleed. Plan is to complete a STAT CT Angiogram with GI protocol to look for brisk bleeding  and resuscitate tonight. If she becomes hemodynamically unstable or continued decline in hemoglobin a diagnosis endoscopy may be considered. Her systolics have increased 10 mmHg with IVF.   I received a message from Dr. Anner, we will stop ASA 81 mg.

## 2023-12-29 NOTE — Telephone Encounter (Signed)
 Chest pain- she reports that it started Saturday. It is a constant pain that won't go away- it does not come and go. She reports that she had stents placed last week by Dr Anner.  She reports also having COPD.She reports sob and she did end up throwing up today.   Informed her that she needs to be seen today- to assess whether this is related to heart or lungs- she can either go to an Urgent Care or the Emergency Room. If you go to the Urgent Care and they feel you need more acute care, they will send you to the emergency room.   She verbalized understanding and will be seen this evening.

## 2023-12-29 NOTE — Consult Note (Incomplete)
 Cardiology Admission History and Physical: ***   Patient ID: Grace [name] MRN: [id number]; DOB: 1956/04/11   Admission date: 12/29/2023  Primary Care Provider: Delores Rojelio Caldron, NP Primary Cardiologist: Alm Clay, MD *** Primary Electrophysiologist:  None ***  Chief Complaint:  ***  Patient Profile:   Grace [name] is a 68 y.o. female with ***  History of Present Illness:   Grace [name] ***   Past Medical History:  Diagnosis Date  . Abnormal vaginal bleeding   . Anxiety   . Asthma   . Barrett's esophagus    with high grade dysplasia per endoscopy 05/2005 // followed by Dr. Lupita Commander ([facility])  . Chronic back pain   . Complication of anesthesia    per patient she went in respiratory failure in recovery room in 2017  . COPD (chronic obstructive pulmonary disease) (HCC)   . Diabetes mellitus without complication (HCC)    type 2  . Duodenal ulcer disease    thought to be contributed at least partly by overuse of NSAIDs and salicylates (goody powdr)  . Endometrial polyp 03/2003   s/p resection in 03/2003. Path showing submucosal leiomyoma and benign  proliferative type endometrium  . Family history of premature CAD 05/19/2018  . Fibroid uterus    s/p myomectomy x 2  . GERD (gastroesophageal reflux disease)   . Hiatal hernia   . History of CVA (cerebrovascular accident) 10/2000  . 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 HSV infection   . Hyperlipidemia   . Hypertension   . Hypothyroidism    with hx of multinodular goiter (noted on neck US  in 11/2002 - showing diffuse nodularity and inhomogenous texture diffusely BL)  . Internal hemorrhoids    grade 2 per colonoscopy in 09/2006 - repeat colonoscopy rec in 5-10 years.  . Migraine   . Nevus   . Personal history of failed conscious sedation 08/11/2011  . Sleep apnea    does not use cpap  . Stroke (HCC) 10/2000  .  Tachycardia   . Thyroid  disease   . Tobacco abuse 04/2012   quit    Past Surgical History:  Procedure Laterality Date  . ADENOIDECTOMY    . APPENDECTOMY  2017  . BALLOON DILATION N/A 06/14/2020   Procedure: BALLOON DILATION;  Surgeon: Wilhelmenia Aloha Raddle., MD;  Location: [facility] ENDOSCOPY;  Service: Gastroenterology;  Laterality: N/A;  . BIOPSY  02/15/2020   Procedure: BIOPSY;  Surgeon: Burnette Fallow, MD;  Location: [facility] ENDOSCOPY;  Service: Endoscopy;;  . BIOPSY  06/14/2020   Procedure: BIOPSY;  Surgeon: Wilhelmenia Aloha Raddle., MD;  Location: [facility] ENDOSCOPY;  Service: Gastroenterology;;  . BIOPSY  03/02/2023   Procedure: BIOPSY;  Surgeon: Saintclair Jasper, MD;  Location: [facility] ENDOSCOPY;  Service: Gastroenterology;;  . BRONCHIAL BIOPSY  03/15/2020   Procedure: BRONCHIAL BIOPSIES;  Surgeon: Brenna Adine LITTIE, DO;  Location: [facility] ENDOSCOPY;  Service: Pulmonary;;  . BRONCHIAL BRUSHINGS  03/15/2020   Procedure: BRONCHIAL BRUSHINGS;  Surgeon: Brenna Adine LITTIE, DO;  Location: [facility] ENDOSCOPY;  Service: Pulmonary;;  . BRONCHIAL NEEDLE ASPIRATION BIOPSY  03/15/2020   Procedure: BRONCHIAL NEEDLE ASPIRATION BIOPSIES;  Surgeon: Brenna Adine LITTIE, DO;  Location: [facility] ENDOSCOPY;  Service: Pulmonary;;  . BRONCHIAL WASHINGS  03/15/2020   Procedure: BRONCHIAL WASHINGS;  Surgeon: Brenna Adine LITTIE, DO;  Location: [facility] ENDOSCOPY;  Service: Pulmonary;;  . CHOLECYSTECTOMY    .  COLONOSCOPY  02/07/2010  . COLONOSCOPY WITH PROPOFOL  N/A 02/15/2020   Procedure: COLONOSCOPY WITH PROPOFOL ;  Surgeon: Burnette Fallow, MD;  Location: [facility] ENDOSCOPY;  Service: Endoscopy;  Laterality: N/A;  . CORONARY IMAGING/OCT N/A 12/23/2023   Procedure: CORONARY IMAGING/OCT;  Surgeon: Anner Alm ORN, MD;  Location[facility] INVASIVE CV LAB;  Service: Cardiovascular;  Laterality: N/A;  . CORONARY STENT INTERVENTION N/A 12/23/2023   Procedure: CORONARY STENT INTERVENTION;  Surgeon: Anner Alm ORN, MD;  Location: [facility] INVASIVE CV LAB;  Service: Cardiovascular;   Laterality: N/A;  . DILATION AND CURETTAGE OF UTERUS    . DILATION AND CURETTAGE OF UTERUS N/A 10/19/2023   Procedure: DILATION AND CURETTAGE;  Surgeon: Jeralyn Crutch, MD;  Location: [facility] OR;  Service: Gynecology;  Laterality: N/A;  ANESTHESIA REQUEST TO BE UNDER IV CONSCIOUS SEDATION LIKE FOR A COLONOSCOPY IF POSSIBLE  . ENDOSCOPIC MUCOSAL RESECTION N/A 06/14/2020   Procedure: ENDOSCOPIC MUCOSAL RESECTION;  Surgeon: Wilhelmenia Aloha Raddle., MD;  Location: [facility] ENDOSCOPY;  Service: Gastroenterology;  Laterality: N/A;  . ESOPHAGOGASTRODUODENOSCOPY (EGD) WITH PROPOFOL  N/A 02/15/2020   Procedure: ESOPHAGOGASTRODUODENOSCOPY (EGD) WITH PROPOFOL ;  Surgeon: Burnette Fallow, MD;  Location: [facility] ENDOSCOPY;  Service: Endoscopy;  Laterality: N/A;  . ESOPHAGOGASTRODUODENOSCOPY (EGD) WITH PROPOFOL  N/A 06/14/2020   Procedure: ESOPHAGOGASTRODUODENOSCOPY (EGD) WITH PROPOFOL ;  Surgeon: Wilhelmenia Aloha Raddle., MD;  Location: [facility] ENDOSCOPY;  Service: Gastroenterology;  Laterality: N/A;  . ESOPHAGOGASTRODUODENOSCOPY (EGD) WITH PROPOFOL  Left 03/02/2023   Procedure: ESOPHAGOGASTRODUODENOSCOPY (EGD) WITH PROPOFOL ;  Surgeon: Saintclair Jasper, MD;  Location: [facility] ENDOSCOPY;  Service: Gastroenterology;  Laterality: Left;  . FOREIGN BODY REMOVAL  06/14/2020   Procedure: FOREIGN BODY REMOVAL;  Surgeon: Wilhelmenia Aloha Raddle., MD;  Location: [facility] ENDOSCOPY;  Service: Gastroenterology;;  . HYSTEROSCOPY    . LAPAROSCOPIC APPENDECTOMY N/A 09/23/2015   Procedure: APPENDECTOMY LAPAROSCOPIC;  Surgeon: Dann Hummer, MD;  Location: [facility] OR;  Service: General;  Laterality: N/A;  . LEFT HEART CATH AND CORONARY ANGIOGRAPHY N/A 12/23/2023   Procedure: LEFT HEART CATH AND CORONARY ANGIOGRAPHY;  Surgeon: Anner Alm ORN, MD;  Location: [facility] INVASIVE CV LAB;  Service: Cardiovascular;  Laterality: N/A;  . MYOMECTOMY  1990, 1997   x 2 - In 1997, noted to have extensive pelvic adhesions and BL tubal obstruction  . POLYPECTOMY  02/15/2020   Procedure:  POLYPECTOMY;  Surgeon: Burnette Fallow, MD;  Location: [facility] ENDOSCOPY;  Service: Endoscopy;;  . TONSILLECTOMY    . TRANSTHORACIC ECHOCARDIOGRAM  06/2014    EF 55-60%.  No RWMA. Gr 1 DD. Cannot R/o PFO (consider bubble study).  SABRA UPPER ESOPHAGEAL ENDOSCOPIC ULTRASOUND (EUS) N/A 06/14/2020   Procedure: UPPER ESOPHAGEAL ENDOSCOPIC ULTRASOUND (EUS);  Surgeon: Wilhelmenia Aloha Raddle., MD;  Location: [facility] ENDOSCOPY;  Service: Gastroenterology;  Laterality: N/A;  . UPPER GASTROINTESTINAL ENDOSCOPY  12/18/2009  . VIDEO BRONCHOSCOPY WITH ENDOBRONCHIAL NAVIGATION N/A 03/15/2020   Procedure: VIDEO BRONCHOSCOPY WITH ENDOBRONCHIAL NAVIGATION;  Surgeon: Brenna Adine CROME, DO;  Location: [facility] ENDOSCOPY;  Service: Pulmonary;  Laterality: N/A;  . VIDEO BRONCHOSCOPY WITH ENDOBRONCHIAL ULTRASOUND  03/15/2020   Procedure: VIDEO BRONCHOSCOPY WITH ENDOBRONCHIAL ULTRASOUND;  Surgeon: Brenna Adine CROME, DO;  Location: [facility] ENDOSCOPY;  Service: Pulmonary;;  . WISDOM TOOTH EXTRACTION       Medications Prior to Admission: Prior to Admission medications   Medication Sig Start Date End Date Taking? Authorizing Provider  albuterol  (PROAIR  HFA) 108 (90 Base) MCG/ACT inhaler INHALE 2 PUFFS EVERY 6  HOURS AS NEEDED FOR  SHORTNESS OF BREATH OR  WHEEZING 05/17/19   Caro Harlene POUR, NP  aspirin  81 MG chewable tablet Chew 81 mg by mouth daily as needed (chest pain).    [provider]  bisoprolol  (ZEBETA ) 10 MG tablet Take 1 & 1/2  tablets (15 mg total) by mouth daily. 12/08/23   Daneen Damien BROCKS, NP  calcium  carbonate (TUMS - DOSED IN MG ELEMENTAL CALCIUM ) 500 MG chewable tablet Chew 1 tablet by mouth daily as needed for indigestion or heartburn.    [provider]  clopidogrel  (PLAVIX ) 75 MG tablet Take 1 tablet (75 mg total) by mouth daily with breakfast. 12/25/23   Henry Shaver B, NP  diphenhydrAMINE  (BENADRYL ) 25 MG tablet Take 50 mg by mouth at bedtime as needed for sleep.    [provider]  docusate sodium   (COLACE) 100 MG capsule Take 300 mg by mouth daily.    [provider]  estradiol  (ESTRACE  VAGINAL) 0.1 MG/GM vaginal cream Place 1 Applicatorful vaginally at bedtime. Patient taking differently: Place 1 Applicatorful vaginally 2 (two) times a week. 08/19/19   Caro Harlene POUR, NP  ezetimibe  (ZETIA ) 10 MG tablet Take 10 mg by mouth daily. 09/29/22   [provider]  fluticasone  (FLONASE ) 50 MCG/ACT nasal spray USE 2 SPRAYS IN BOTH NOSTRILS  DAILY 09/29/23   Cobb, Comer GAILS, NP  furosemide  (LASIX ) 40 MG tablet TAKE 1 TABLET BY MOUTH DAILY AS  NEEDED FOR WEIGHT GAIN OF 3 LB  OVER AT NIGHT OR 5 LB IN 1 WEEK 08/31/23   Monge, Damien BROCKS, NP  glucose blood (ONETOUCH VERIO) test strip Use to check blood sugar twice daily. Dx: E11.9 08/25/18   Caro Harlene POUR, NP  hydrALAZINE  (APRESOLINE ) 50 MG tablet Take 1 tablet (50 mg total) by mouth 3 (three) times daily. May take an additional 50 mg if needed for blood pressureif still elevated is greater than or equal to 160 systolic after 1 hour recheck of blood pressure 08/27/23   Anner Alm ORN, MD  ipratropium-albuterol  (DUONEB) 0.5-2.5 (3) MG/3ML SOLN Take 3 mLs by nebulization every 6 (six) hours as needed. 02/07/19   Kassie Acquanetta Bradley, MD  isosorbide  mononitrate (IMDUR ) 30 MG 24 hr tablet Take 3 tablets (90 mg total) by mouth daily. 10/07/23   Anner Alm ORN, MD  Lancets [facility] DELICA PLUS [place]) MISC USE TO CHECK BLOOD SUGAR  TWICE DAILY 04/01/19   Eubanks, Jessica K, NP  levothyroxine  (SYNTHROID ) 200 MCG tablet Take 200 mcg by mouth at bedtime.    [provider]  losartan  (COZAAR ) 100 MG tablet TAKE 1 TABLET BY MOUTH  DAILY Patient taking differently: Take 100 mg by mouth at bedtime. 07/20/19   Eubanks, Jessica K, NP  nitroGLYCERIN  (NITROSTAT ) 0.4 MG SL tablet Place 0.4 mg under the tongue every 5 (five) minutes as needed for chest pain. 08/23/23   [provider]  OZEMPIC, 0.25 OR 0.5 MG/DOSE, 2 MG/3ML SOPN Inject 0.5  mg into the skin once a week. 08/30/23   [provider]  pantoprazole  (PROTONIX ) 40 MG tablet Take 1 tablet (40 mg total) by mouth daily. 12/24/23   Henry Shaver NOVAK, NP  pregabalin  (LYRICA ) 50 MG capsule Take 50 mg by mouth daily. 10/13/22   [provider]  REPATHA  SURECLICK 140 MG/ML SOAJ INJECT 1 PEN SUBCUTANEOUSLY  EVERY 2 WEEKS 02/20/23   Anner Alm ORN, MD  Respiratory Therapy Supplies (NEBULIZER/TUBING/MOUTHPIECE) KIT 1 application by Does not apply route every 6 (six) hours as needed. FAX TO [phone] 07/19/19   Caro Harlene POUR, NP  traMADol  (ULTRAM ) 50  MG tablet Take 50 mg by mouth every 6 (six) hours as needed for moderate pain (pain score 4-6) or severe pain (pain score 7-10). 03/16/23   [provider]  valACYclovir  (VALTREX ) 500 MG tablet TAKE 1 TABLET BY MOUTH  DAILY 11/23/19   Eubanks, Jessica K, NP  Vibegron  75 MG TABS Take 1 tablet (75 mg total) by mouth daily. 07/24/23   Zuleta, Kaitlin G, NP  ipratropium (ATROVENT  HFA) 17 MCG/ACT inhaler Inhale 2 puffs into the lungs 4 (four) times daily. 04/25/11 06/30/11  Rulon Adine ORN, MD     Allergies:    Allergies  Allergen Reactions  . Amlodipine      Cramps   . Codeine     Headaches   . Gabapentin      headache  . Prozac [Fluoxetine Hcl] Other (See Comments)    Suicidal ideations    Social History:   Social History   Socioeconomic History  . Marital status: Divorced    Spouse name: Not on file  . Number of children: 0  . Years of education: 31  . Highest education level: High school graduate  Occupational History  . Occupation: Electronic record    Comment: [employer]  Tobacco Use  . Smoking status: Former    Current packs/day: 0.00    Average packs/day: 3.0 packs/day for 50.0 years (150.0 ttl pk-yrs)    Types: Cigarettes    Start date: 09/22/1965    Quit date: 09/23/2015    Years since quitting: 8.2  . Smokeless tobacco: Never  . Tobacco comments:    Counseling sheet 07-2011    Vaping Use  . Vaping status: Never Used  Substance and Sexual Activity  . Alcohol use: No    Alcohol/week: 0.0 standard drinks of alcohol  . Drug use: No  . Sexual activity: Yes    Birth control/protection: Post-menopausal  Other Topics Concern  . Not on file  Social History Narrative   Financial assistance approved for 100% discount at [facility] and has [facility] card per Barnie Potters   01/15/2010      Diet- N/A   Caffeine- Goody powders   Married-Divorced   [employer] and lives with mother   Pets-2   Current/past profession-[employer]   Exercise-No   Living will-No   DNR-No   POA/HPOA-No         Social Drivers of Health   Financial Resource Strain: Medium Risk (02/23/2018)   Overall Financial Resource Strain (CARDIA)   . Difficulty of Paying Living Expenses: Somewhat hard  Food Insecurity: No Food Insecurity (12/23/2023)   Hunger Vital Sign   . Worried About Programme researcher, broadcasting/film/video in the Last Year: Never true   . Ran Out of Food in the Last Year: Never true  Transportation Needs: No Transportation Needs (12/23/2023)   PRAPARE - Transportation   . Lack of Transportation (Medical): No   . Lack of Transportation (Non-Medical): No  Physical Activity: Inactive (02/23/2018)   Exercise Vital Sign   . Days of Exercise per Week: 0 days   . Minutes of Exercise per Session: 0 min  Stress: Stress Concern Present (02/23/2018)   [employer] of Occupational Health - Occupational Stress Questionnaire   . Feeling of Stress : Rather much  Social Connections: Moderately Integrated (12/23/2023)   Social Connection and Isolation Panel   . Frequency of Communication with Friends and Family: Twice a week   . Frequency of Social Gatherings with Friends and Family: Twice a week   . Attends Religious Services:  More than 4 times per year   . Active Member of Clubs or Organizations: Yes   . Attends Banker Meetings: More than 4 times per year   . Marital Status: Divorced  Catering manager  Violence: Not At Risk (12/23/2023)   Humiliation, Afraid, Rape, and Kick questionnaire   . Fear of Current or Ex-Partner: No   . Emotionally Abused: No   . Physically Abused: No   . Sexually Abused: No    Family History:  *** The patient's family history includes COPD in her father and mother; Heart disease (age of onset: 29) in her father. There is no history of Colon cancer, Esophageal cancer, Inflammatory bowel disease, Liver disease, Pancreatic cancer, Rectal cancer, Stomach cancer, or Thyroid  disease.    Review of Systems: [y] = yes, [ ]  = no    General: Weight gain [ ] ; Weight loss [ ] ; Anorexia [ ] ; Fatigue [ ] ; Fever [ ] ; Chills [ ] ; Weakness [ ]   Cardiac: Chest pain/pressure [ ] ; Resting SOB [ ] ; Exertional SOB [ ] ; Orthopnea [ ] ; Pedal Edema [ ] ; Palpitations [ ] ; Syncope [ ] ; Presyncope [ ] ; Paroxysmal nocturnal dyspnea[ ]   Pulmonary: Cough [ ] ; Wheezing[ ] ; Hemoptysis[ ] ; Sputum [ ] ; Snoring [ ]   GI: Vomiting[ ] ; Dysphagia[ ] ; Melena[ ] ; Hematochezia [ ] ; Heartburn[ ] ; Abdominal pain [ ] ; Constipation [ ] ; Diarrhea [ ] ; BRBPR [ ]   GU: Hematuria[ ] ; Dysuria [ ] ; Nocturia[ ]   Vascular: Pain in legs with walking [ ] ; Pain in feet with lying flat [ ] ; Non-healing sores [ ] ; Stroke [ ] ; TIA [ ] ; Slurred speech [ ] ;  Neuro: Headaches[ ] ; Vertigo[ ] ; Seizures[ ] ; Paresthesias[ ] ;Blurred vision [ ] ; Diplopia [ ] ; Vision changes [ ]   Ortho/Skin: Arthritis [ ] ; Joint pain [ ] ; Muscle pain [ ] ; Joint swelling [ ] ; Back Pain [ ] ; Rash [ ]   Psych: Depression[ ] ; Anxiety[ ]   Heme: Bleeding problems [ ] ; Clotting disorders [ ] ; Anemia [ ]   Endocrine: Diabetes [ ] ; Thyroid  dysfunction[ ]   Physical Exam/Data:   Vitals:   12/29/23 1900 12/29/23 1945 12/29/23 2100 12/29/23 2145  BP: 108/67 95/67 118/77 125/71  Pulse: 95 95 (!) 115 (!) 110  Resp: 16 10 15 14   Temp:      TempSrc:      SpO2: 99% 94% 93% 94%  Weight:      Height:        Intake/Output Summary (Last 24 hours) at 12/29/2023  2330 Last data filed at 12/29/2023 2248 Gross per 24 hour  Intake 1045.03 ml  Output --  Net 1045.03 ml   Filed Weights   12/29/23 1855  Weight: 80.3 kg   Body mass index is 31.35 kg/m.  General:  Well nourished, well developed, in no acute distress*** HEENT: normal Neck: no*** JVD Vascular: No carotid bruits; FA pulses 2+ bilaterally without bruits  Cardiac:  normal S1, S2; RRR; no murmur *** Lungs:  clear to auscultation bilaterally, no wheezing, rhonchi or rales  Abd: soft, nontender, no hepatomegaly  Ext: no*** edema Musculoskeletal:  No deformities Skin: warm and dry  Psych:  Normal affect    EKG:  The ECG that was done *** was personally reviewed and demonstrates ***  Relevant CV Studies: ***  Laboratory Data:  Component     Latest Ref Rng 12/24/2023 12/29/2023  WBC     4.0 - 10.5 K/uL 6.7  8.9   RBC  3.87 - 5.11 MIL/uL 4.29  3.18 (L)   Hemoglobin     12.0 - 15.0 g/dL 87.2  8.3 (L)   Hemoglobin       10.2 (L)   Hemoglobin       9.4 (L)   HCT     36.0 - 46.0 % 40.2  27.4 (L)   HCT       30.0 (L)   HCT       31.7 (L)   MCV     80.0 - 100.0 fL 93.7  99.7   MCH     26.0 - 34.0 pg 29.6  29.6   MCHC     30.0 - 36.0 g/dL 68.3  70.2 (L)   RDW     11.5 - 15.5 % 13.2  13.5   Platelets     150 - 400 K/uL 246  269   MPV     7.5 - 12.5 fL    NEUT#     1.7 - 7.7 K/uL    Lymphs Abs     0.7 - 4.0 K/uL    Absolute Monocytes     200 - 950 cells/uL    Eosinophils Absolute     0.0 - 0.5 K/uL    Basophils Absolute     0.0 - 0.1 K/uL    Neutrophils     %    Total Lymphocyte     %    Monocytes Relative     %    Eosinophil     %    Basophil     %    Lymphocytes     %    Monocyte #     0.1 - 1.0 K/uL    nRBC     0.0 - 0.2 % 0.0  0.0   Immature Granulocytes     %    Abs Immature Granulocytes     0.00 - 0.07 K/uL    Sodium     135 - 145 mmol/L  141   Potassium     3.5 - 5.1 mmol/L  3.4 (L)   Chloride     98 - 111 mmol/L  101   BUN     8 -  23 mg/dL  21   Creatinine     9.55 - 1.00 mg/dL  9.19   Glucose     70 - 99 mg/dL  846 (H)   Calcium  Ionized     1.15 - 1.40 mmol/L  1.10 (L)   TCO2     22 - 32 mmol/L  26     Component     Latest Ref Rng 12/29/2023  Glucose     70 - 99 mg/dL 846 (H)   Glucose      150 (H)   BUN     8 - 23 mg/dL 21   BUN      20   Creatinine     0.44 - 1.00 mg/dL 9.19   Creatinine      0.86   Sodium     135 - 145 mmol/L 141   Sodium      140   Potassium     3.5 - 5.1 mmol/L 3.4 (L)   Potassium      3.5   Chloride     98 - 111 mmol/L 101   Chloride      102   CO2     22 - 32 mmol/L 26  Calcium      8.9 - 10.3 mg/dL 9.0   GFR, Estimated     >60 mL/min >60   Anion gap     5 - 15  12     Legend: (H) High (L) Low Legend: (L) Low (H) High Component     Latest Ref Rng 12/24/2023 12/29/2023  Hemoglobin     12.0 - 15.0 g/dL 87.2  8.3 (L)   Hemoglobin       10.2 (L)   Hemoglobin       9.4 (L)   HCT     36.0 - 46.0 % 40.2  27.4 (L)   HCT       30.0 (L)   HCT       31.7 (L)     Component     Latest Ref Rng 12/29/2023  Lactic Acid, Venous     0.5 - 1.9 mmol/L 1.2    Component     Latest Ref Rng 12/29/2023  Troponin I (High Sensitivity)     <18 ng/L 185 (HH)   Troponin I (High Sensitivity)      156 (HH)      Radiology/Studies:  DG Chest 2 View Result Date: 12/29/2023 CLINICAL DATA:  chestpain EXAM: CHEST - 2 VIEW COMPARISON:  Chest x-ray 01/09/2023. FINDINGS: The heart and mediastinal contours are unchanged. Atherosclerotic plaque. No focal consolidation. No pulmonary edema. No pleural effusion. No pneumothorax. No acute osseous abnormality. IMPRESSION: 1. No active cardiopulmonary disease. 2.  Aortic Atherosclerosis (ICD10-I70.0). Electronically Signed   By: Morgane  Naveau M.D.   On: 12/29/2023 19:38    Assessment and Plan:   After my evaluation, I became concerned of a GI etiology due to her epigastric tenderness and 3 point hemoglobin drop in the last 5 days, BUN  elevation  and hypotension (baseline systolic blood pressure is 140-150; presented with systolics in the 90s). It seemed like her predominant symptoms are epigastric pain (anginal symptoms prior to the [facility] were left sided chest pain that is not not experiencing) and nausea. Prior to my evaluation, she had been started on heparin  due to the concerns of chest pain and ST segment deviation. However, due to the above suspicion I ordered a hemoglobin and asked the ED team to discontinue to the heparin  if the hemoglobin returns lower as I do not think her coronaries are the main driver of her symptoms. I also asked for IV PPI to be started and a medicine admission. My initial suspicion is the rise in troponin with the EKG changes are related to demand ischemia from acute blood loss anemia in the setting of DAPT initiation. It is unclear to me why she has not had melena or any subjective signs of bleeding (LHC was radial approach).  - Repeat EKG once volume replete  - Transfuse for Hgb <7  - Monitor H&H  - Please consult GI for evaluation - Recent PCI in the last week; please page to discuss risk/benefits regarding DAPT if she is ineed bleeding with dropping hemoglobin.    For questions or updates, please contact [facility] Please consult [email or web address] for contact info under        Signed, Merlene JAYSON Blood, MD  12/29/2023 11:30 PM   Merlene Blood, MD MS  Cardiology [facility]

## 2023-12-30 ENCOUNTER — Encounter (HOSPITAL_COMMUNITY): Payer: Self-pay | Admitting: Internal Medicine

## 2023-12-30 ENCOUNTER — Emergency Department (HOSPITAL_COMMUNITY)

## 2023-12-30 ENCOUNTER — Inpatient Hospital Stay (HOSPITAL_COMMUNITY)

## 2023-12-30 DIAGNOSIS — I2510 Atherosclerotic heart disease of native coronary artery without angina pectoris: Secondary | ICD-10-CM | POA: Diagnosis not present

## 2023-12-30 DIAGNOSIS — K449 Diaphragmatic hernia without obstruction or gangrene: Secondary | ICD-10-CM | POA: Diagnosis not present

## 2023-12-30 DIAGNOSIS — Z8249 Family history of ischemic heart disease and other diseases of the circulatory system: Secondary | ICD-10-CM | POA: Diagnosis not present

## 2023-12-30 DIAGNOSIS — G8929 Other chronic pain: Secondary | ICD-10-CM | POA: Diagnosis present

## 2023-12-30 DIAGNOSIS — G4733 Obstructive sleep apnea (adult) (pediatric): Secondary | ICD-10-CM | POA: Diagnosis present

## 2023-12-30 DIAGNOSIS — Z79890 Hormone replacement therapy (postmenopausal): Secondary | ICD-10-CM | POA: Diagnosis not present

## 2023-12-30 DIAGNOSIS — Z7985 Long-term (current) use of injectable non-insulin antidiabetic drugs: Secondary | ICD-10-CM | POA: Diagnosis not present

## 2023-12-30 DIAGNOSIS — I2489 Other forms of acute ischemic heart disease: Secondary | ICD-10-CM | POA: Diagnosis present

## 2023-12-30 DIAGNOSIS — I10 Essential (primary) hypertension: Secondary | ICD-10-CM | POA: Diagnosis present

## 2023-12-30 DIAGNOSIS — R079 Chest pain, unspecified: Secondary | ICD-10-CM

## 2023-12-30 DIAGNOSIS — R7989 Other specified abnormal findings of blood chemistry: Secondary | ICD-10-CM | POA: Diagnosis not present

## 2023-12-30 DIAGNOSIS — E119 Type 2 diabetes mellitus without complications: Secondary | ICD-10-CM | POA: Diagnosis present

## 2023-12-30 DIAGNOSIS — Z7982 Long term (current) use of aspirin: Secondary | ICD-10-CM | POA: Diagnosis not present

## 2023-12-30 DIAGNOSIS — Z66 Do not resuscitate: Secondary | ICD-10-CM | POA: Diagnosis present

## 2023-12-30 DIAGNOSIS — K254 Chronic or unspecified gastric ulcer with hemorrhage: Secondary | ICD-10-CM | POA: Diagnosis present

## 2023-12-30 DIAGNOSIS — Z23 Encounter for immunization: Secondary | ICD-10-CM | POA: Diagnosis present

## 2023-12-30 DIAGNOSIS — Z955 Presence of coronary angioplasty implant and graft: Secondary | ICD-10-CM | POA: Diagnosis not present

## 2023-12-30 DIAGNOSIS — D62 Acute posthemorrhagic anemia: Secondary | ICD-10-CM | POA: Diagnosis present

## 2023-12-30 DIAGNOSIS — Z6831 Body mass index (BMI) 31.0-31.9, adult: Secondary | ICD-10-CM | POA: Diagnosis not present

## 2023-12-30 DIAGNOSIS — R578 Other shock: Secondary | ICD-10-CM | POA: Diagnosis present

## 2023-12-30 DIAGNOSIS — Z7902 Long term (current) use of antithrombotics/antiplatelets: Secondary | ICD-10-CM | POA: Diagnosis not present

## 2023-12-30 DIAGNOSIS — Z87891 Personal history of nicotine dependence: Secondary | ICD-10-CM | POA: Diagnosis not present

## 2023-12-30 DIAGNOSIS — Z823 Family history of stroke: Secondary | ICD-10-CM | POA: Diagnosis not present

## 2023-12-30 DIAGNOSIS — K922 Gastrointestinal hemorrhage, unspecified: Secondary | ICD-10-CM

## 2023-12-30 DIAGNOSIS — J4489 Other specified chronic obstructive pulmonary disease: Secondary | ICD-10-CM | POA: Diagnosis present

## 2023-12-30 DIAGNOSIS — K921 Melena: Secondary | ICD-10-CM | POA: Diagnosis present

## 2023-12-30 DIAGNOSIS — Z825 Family history of asthma and other chronic lower respiratory diseases: Secondary | ICD-10-CM | POA: Diagnosis not present

## 2023-12-30 DIAGNOSIS — E785 Hyperlipidemia, unspecified: Secondary | ICD-10-CM | POA: Diagnosis present

## 2023-12-30 DIAGNOSIS — I25118 Atherosclerotic heart disease of native coronary artery with other forms of angina pectoris: Secondary | ICD-10-CM | POA: Diagnosis not present

## 2023-12-30 DIAGNOSIS — K2270 Barrett's esophagus without dysplasia: Secondary | ICD-10-CM | POA: Diagnosis not present

## 2023-12-30 DIAGNOSIS — E039 Hypothyroidism, unspecified: Secondary | ICD-10-CM | POA: Diagnosis present

## 2023-12-30 DIAGNOSIS — I4719 Other supraventricular tachycardia: Secondary | ICD-10-CM | POA: Diagnosis present

## 2023-12-30 LAB — CBC
HCT: 31.7 % — ABNORMAL LOW (ref 36.0–46.0)
HCT: 26.1 % — ABNORMAL LOW (ref 36.0–46.0)
Hemoglobin: 9.9 g/dL — ABNORMAL LOW (ref 12.0–15.0)
Hemoglobin: 8.1 g/dL — ABNORMAL LOW (ref 12.0–15.0)
MCH: 29.6 pg (ref 26.0–34.0)
MCH: 29.8 pg (ref 26.0–34.0)
MCHC: 31.2 g/dL (ref 30.0–36.0)
MCHC: 31 g/dL (ref 30.0–36.0)
MCV: 94.9 fL (ref 80.0–100.0)
MCV: 96 fL (ref 80.0–100.0)
Platelets: 233 K/uL (ref 150–400)
Platelets: 221 K/uL (ref 150–400)
RBC: 3.34 MIL/uL — ABNORMAL LOW (ref 3.87–5.11)
RBC: 2.72 MIL/uL — ABNORMAL LOW (ref 3.87–5.11)
RDW: 16.9 % — ABNORMAL HIGH (ref 11.5–15.5)
RDW: 16.6 % — ABNORMAL HIGH (ref 11.5–15.5)
WBC: 10.5 K/uL (ref 4.0–10.5)
WBC: 11.8 K/uL — ABNORMAL HIGH (ref 4.0–10.5)
nRBC: 0.2 % (ref 0.0–0.2)
nRBC: 0.2 % (ref 0.0–0.2)

## 2023-12-30 LAB — ECHOCARDIOGRAM COMPLETE
Area-P 1/2: 5.34 cm2
Calc EF: 57.7 %
Height: 63 in
S' Lateral: 2.9 cm
Single Plane A2C EF: 63 %
Single Plane A4C EF: 52.5 %
Weight: 2832 [oz_av]

## 2023-12-30 LAB — PREPARE RBC (CROSSMATCH)

## 2023-12-30 LAB — HEMOGLOBIN AND HEMATOCRIT, BLOOD
HCT: 34.7 % — ABNORMAL LOW (ref 36.0–46.0)
HCT: 32.5 % — ABNORMAL LOW (ref 36.0–46.0)
HCT: 30.7 % — ABNORMAL LOW (ref 36.0–46.0)
Hemoglobin: 10.9 g/dL — ABNORMAL LOW (ref 12.0–15.0)
Hemoglobin: 10.1 g/dL — ABNORMAL LOW (ref 12.0–15.0)
Hemoglobin: 9.7 g/dL — ABNORMAL LOW (ref 12.0–15.0)

## 2023-12-30 LAB — HEMOGLOBIN A1C
Hgb A1c MFr Bld: 5.7 % — ABNORMAL HIGH (ref 4.8–5.6)
Mean Plasma Glucose: 116.89 mg/dL

## 2023-12-30 LAB — HEPARIN LEVEL (UNFRACTIONATED): Heparin Unfractionated: <0.1 [IU]/mL — ABNORMAL LOW (ref 0.30–0.70)

## 2023-12-30 LAB — COMPREHENSIVE METABOLIC PANEL WITH GFR
ALT: 18 U/L (ref 0–44)
AST: 24 U/L (ref 15–41)
Albumin: 2.5 g/dL — ABNORMAL LOW (ref 3.5–5.0)
Alkaline Phosphatase: 47 U/L (ref 38–126)
Anion gap: 11 (ref 5–15)
BUN: 28 mg/dL — ABNORMAL HIGH (ref 8–23)
CO2: 21 mmol/L — ABNORMAL LOW (ref 22–32)
Calcium: 8.5 mg/dL — ABNORMAL LOW (ref 8.9–10.3)
Chloride: 108 mmol/L (ref 98–111)
Creatinine, Ser: 0.76 mg/dL (ref 0.44–1.00)
GFR, Estimated: >60 mL/min (ref >60)
Glucose, Bld: 169 mg/dL — ABNORMAL HIGH (ref 70–99)
Potassium: 4.2 mmol/L (ref 3.5–5.1)
Sodium: 140 mmol/L (ref 135–145)
Total Bilirubin: 0.5 mg/dL (ref 0.0–1.2)
Total Protein: 5.1 g/dL — ABNORMAL LOW (ref 6.5–8.1)

## 2023-12-30 LAB — LACTIC ACID, PLASMA: Lactic Acid, Venous: 0.7 mmol/L (ref 0.5–1.9)

## 2023-12-30 LAB — TROPONIN I (HIGH SENSITIVITY)
Troponin I (High Sensitivity): 165 ng/L (ref <18)
Troponin I (High Sensitivity): 186 ng/L (ref <18)

## 2023-12-30 LAB — CBG MONITORING, ED
Glucose-Capillary: 125 mg/dL — ABNORMAL HIGH (ref 70–99)
Glucose-Capillary: 139 mg/dL — ABNORMAL HIGH (ref 70–99)
Glucose-Capillary: 169 mg/dL — ABNORMAL HIGH (ref 70–99)

## 2023-12-30 MED ORDER — IOHEXOL 350 MG/ML SOLN
75.0000 mL | Freq: Once | INTRAVENOUS | Status: AC | PRN
  Administered 2023-12-30: 75 mL via INTRAVENOUS

## 2023-12-30 MED ORDER — IPRATROPIUM-ALBUTEROL 0.5-2.5 (3) MG/3ML IN SOLN
3.0000 mL | Freq: Four times a day (QID) | RESPIRATORY_TRACT | Status: DC | PRN
  Administered 2023-12-30 – 2024-01-02 (×7): 3 mL via RESPIRATORY_TRACT
  Filled 2023-12-30 (×7): qty 3

## 2023-12-30 MED ORDER — INSULIN ASPART 100 UNIT/ML IJ SOLN
0.0000 [IU] | Freq: Three times a day (TID) | INTRAMUSCULAR | Status: DC
  Administered 2023-12-31: 3 [IU] via SUBCUTANEOUS
  Administered 2024-01-01 – 2024-01-02 (×2): 1 [IU] via SUBCUTANEOUS

## 2023-12-30 MED ORDER — ASPIRIN 81 MG PO TBEC
81.0000 mg | DELAYED_RELEASE_TABLET | Freq: Every day | ORAL | Status: DC
  Administered 2023-12-30: 81 mg via ORAL
  Filled 2023-12-30: qty 1

## 2023-12-30 MED ORDER — ACETAMINOPHEN 325 MG PO TABS
650.0000 mg | ORAL_TABLET | Freq: Four times a day (QID) | ORAL | Status: DC | PRN
  Administered 2023-12-30 – 2024-01-02 (×6): 650 mg via ORAL
  Filled 2023-12-30 (×6): qty 2

## 2023-12-30 MED ORDER — SODIUM CHLORIDE 0.9% IV SOLUTION: Freq: Once | INTRAVENOUS | Status: AC

## 2023-12-30 MED ORDER — FUROSEMIDE 10 MG/ML IJ SOLN
40.0000 mg | Freq: Two times a day (BID) | INTRAMUSCULAR | Status: AC
  Administered 2023-12-31: 40 mg via INTRAVENOUS
  Filled 2023-12-30 (×2): qty 4

## 2023-12-30 NOTE — ED Notes (Signed)
 Tray ordered.

## 2023-12-30 NOTE — ED Notes (Signed)
 Provider made aware of current BP, awaiting further orders regarding lasix  administration

## 2023-12-30 NOTE — ED Notes (Addendum)
 Provider made aware of current BP. Also made aware of pt c/o intermittent CP, SOB, nausea. RN request for MD to come evaluate pt. Awaiting response.

## 2023-12-30 NOTE — Progress Notes (Signed)
 Patient seen and examined, admitted earlier this morning by Dr. Keturah, briefly 68/F with CAD, CVA, type 2 diabetes mellitus, hypothyroidism, COPD, OSA, GERD, recently underwent PCI with DES to RCA on 9/10, started on DAPT, discharged home 9/11 developed weakness, upper abdominal discomfort, vomiting with black material, also noted some shortness of breath and chest discomfort. - On admission significant drop in hemoglobin from baseline trended down to 8.3 from 12,  3 days ago.   Acute blood loss anemia, upper GI bleed -Transfused 2 units of PRBC overnight -History of prior upper GI bleed with Barrett's esophagus and duodenal stricture 11/24 -Transfused 2 units PRBC -Continue IV PPI, keep n.p.o. [facility] consulted, anticipate EGD today -Plavix  continued, aspirin  on hold now - Monitor CBC  CAD -Recent RCA DES on 9/10 -Now with upper GI bleed and acute blood loss anemia -Cards following, continue bisoprolol , Plavix , aspirin  being held temporarily - Appears volume overloaded> likely iatrogenic from transfusion, Lasix  X2 today  Rest as noted by Dr. Keturah H&P this morning  Sigurd Pac, MD

## 2023-12-30 NOTE — Progress Notes (Signed)
 Cardiology Progress Note  Patient ID: Grace [name] MRN: [id number] DOB: Jan 24, 1956 Date of Encounter: 12/30/2023 Primary Cardiologist: Alm Clay, MD  Subjective   Chief Complaint: Abdominal pain  HPI: Admitted overnight with chest pain and abdominal pain.  Hemoglobin has dropped from 12.7-8.3.  Status post 2 units of packed red blood cells.  Concerns for GI bleed.  Recent RCA PCI.  CT AP without active bleeding.   ROS:  All other ROS reviewed and negative. Pertinent positives noted in the HPI.     Telemetry  Overnight telemetry shows sinus rhythm 80s, which I personally reviewed.   ECG  The most recent ECG shows normal sinus rhythm heart rate 96, nonspecific ST-T changes, which I personally reviewed.   Physical Exam   Vitals:   12/30/23 0600 12/30/23 0655 12/30/23 0700 12/30/23 0715  BP: (!) 146/80  (!) 131/100   Pulse: 88   87  Resp: 16  16   Temp:  98.3 F (36.8 C)    TempSrc:  Oral    SpO2: 99%  97%   Weight:      Height:        Intake/Output Summary (Last 24 hours) at 12/30/2023 0922 Last data filed at 12/30/2023 0541 Gross per 24 hour  Intake 2148.03 ml  Output --  Net 2148.03 ml       12/29/2023    6:55 PM 12/23/2023    8:39 AM 12/08/2023    9:48 AM  Last 3 Weights  Weight (lbs) 177 lb 180 lb 176 lb 12.8 oz  Weight (kg) 80.287 kg 81.647 kg 80.196 kg    Body mass index is 31.35 kg/m.  General: Well nourished, well developed, in no acute distress Head: Atraumatic, normal size  Eyes: PEERLA, EOMI  Neck: Supple, no JVD Endocrine: No thryomegaly Cardiac: Normal S1, S2; RRR; no murmurs, rubs, or gallops Lungs: Clear to auscultation bilaterally, no wheezing, rhonchi or rales  Abd: Soft, nontender, no hepatomegaly  Ext: No edema, pulses 2+ Musculoskeletal: No deformities, BUE and BLE strength normal and equal Skin: Warm and dry, no rashes   Neuro: Alert and oriented to person, place, time, and situation, CNII-XII grossly intact, no focal deficits   Psych: Normal mood and affect   Cardiac Studies  LHC 12/23/2023   Prox RCA-1 lesion is 50% stenosed. Prox RCA-2 lesion is 80% stenosed.  Mid RCA lesion is 80% stenosed with 40% stenosed side branch in Acute Mrg.   Scoring balloon angioplasty was performed using a BALLOON SCOREFLEX 4.0X10 at both 80% lesions and into the percent stenosis.   Prox RCA-2 lesion is 80% stenosed.   A drug-eluting stent was successfully placed covering all 3 lesions, using a STENT ONYX FRONTIER 4.0X38 postdilated to 4.2 mm.  Post intervention, there is a 0-15% residual stenosis with TIMI-3 flow maintained however small sidebranch was temporarily occluded and recovered prior to completion of procedure.   Post intervention, there is a 0% residual stenosis.   The left ventricular systolic function is normal. The left ventricular ejection fraction is 55-65% by visual estimate.   LV end diastolic pressure is normal.   There is mild aortic valve stenosis. There is no aortic valve regurgitation.   Continue risk factor GDMT-is on Repatha  for lipids.  Blood pressure grossly well-controlled.   Recommend uninterrupted dual antiplatelet therapy with Aspirin  81mg  daily and Clopidogrel  75mg  daily for a minimum of 6 months (stable ischemic heart disease-Class I recommendation).   EGD 03/02/2023 Barrett's esophagus antral gastritis  Patient Profile  Grace [name] is a 68 y.o. female with CAD status post PCI to the mid RCA 12/23/2023, hypertension, diabetes, stroke, OSA, COPD admitted on 12/29/2023 with epigastric pain and new onset anemia concerns for GI bleed.  Assessment & Plan   # Epigastric pain # GI bleed # Anemia # Elevated troponin, demand ischemia - Admitted with chest pain and epigastric pain.  Found to have new onset anemia.  Recent PCI makes things a little complicated. - Recent EGD in November 2024 showed antral gastritis with Barrett's esophagus. - Status post 2 units of packed red blood cells. - Unfortunately  given recent PCI we will have to continue DAPT.  Continue aspirin  81 mg daily and Plavix  75 mg daily. - GI to evaluate.  Likely will need diagnostic only evaluation if she cannot undergo intervention on DAPT.  Suspect we will manage this conservatively.  Her blood counts are somewhat stable. - We will also update her echocardiogram. - Overall I suspect her troponin leak is demand.  She is anemic.  Would not pursue repeat cardiac catheterization.  EKG is reassuring. - Troponins are minimally elevated and flat. - PPI per hospital medicine. - Holding home blood pressure medications in the setting of GI bleed - CT abdomen pelvis without acute bleed which is reassuring.  Hopefully this is stopped.  # CAD status post PCI to mid RCA 12/23/2023 - Not here with GI bleed.  We will have to continue DAPT for now.  Conservative care. - She is on Repatha  as an outpatient.  # HTN - BP meds were held in the setting of GI bleed.     For questions or updates, please contact [facility] Please consult [email or web address] for contact info under         Signed, Darryle T. Barbaraann, MD, [facility] [place]   [facility]  12/30/2023 9:22 AM

## 2023-12-30 NOTE — ED Provider Notes (Signed)
  Accepted handoff at shift change from smoot PA-C. Please see prior provider note for more detail.   Briefly: Patient is 68 y.o. presents today with complaints of chest pain. Reports that her pain began on Saturday and has been persistent and worsening since then. Reports that her pain is substernal in nature and radiates to her left side. Pain feels like squeezing. Denies history of similar pain previously. Does report that she had a cardiac stent placed on 9/10 after she was found to have a coronary calcium  score in the 99th percentile. She did not have an MI but does report that she was having pain before she had the stent placed. Reports that her pain feels similar in nature to the pain she was having before she had her stent placed but is worse. Reports she is having nausea as well. Pain is worse with exertion and relieved with rest. Reports compliance with her Plavix  at home. Additionally patient denies hematemesis, hematochezia, or melena.   Plan:  - hospitalist (Dr. Keturah) was able to consult with GI who is not able to do emergent scopes d/t patient being on DAPT. Dr. Keturah asking me to consult with PCCM as patient has a risk of decompensating given acute anemia and currently tachycardia - especially since GI is not able to perform scopes to localize the bleed.  - Patient does not meet criteria for PCCM management. Dr. Segars will admit.        Hoy Nidia FALCON, [place] 12/30/23 0151    Francesca Elsie CROME, MD 01/06/24 [phone]

## 2023-12-30 NOTE — Progress Notes (Signed)
  Echocardiogram 2D Echocardiogram has been performed.  [name] 12/30/2023, 11:43 AM

## 2023-12-30 NOTE — Consult Note (Signed)
 Reason for Consult: GI bleed Referring Physician: Hospital team  Grace [name] is an 68 y.o. female.  HPI: Patient seen and examined and her hospital computer chart and our office computer chart reviewed including her previous workup and last week had stents put in and was started on Plavix  in addition to her chronic aspirin  and she has been on pump inhibitors all along and her omeprazole  was changed to pantoprazole  and she has not had a bowel movement in 5 days and only threw up blood which was dark 1 time and really has not had much GI symptoms and right now just wants to eat and has no other complaints  Past Medical History:  Diagnosis Date   Abnormal vaginal bleeding    Anxiety    Asthma    Barrett's esophagus    with high grade dysplasia per endoscopy 05/2005 // followed by Dr. Lupita Commander ([facility])   Chronic back pain    Complication of anesthesia    per patient she went in respiratory failure in recovery room in 2017   COPD (chronic obstructive pulmonary disease) (HCC)    Diabetes mellitus without complication (HCC)    type 2   Duodenal ulcer disease    thought to be contributed at least partly by overuse of NSAIDs and salicylates (goody powdr)   Endometrial polyp 03/2003   s/p resection in 03/2003. Path showing submucosal leiomyoma and benign  proliferative type endometrium   Family history of premature CAD 05/19/2018   Fibroid uterus    s/p myomectomy x 2   GERD (gastroesophageal reflux disease)    Hiatal hernia    History of CVA (cerebrovascular accident) 10/2000   History of pineal cyst    11 mm cystic mass noted in the pineal gland per MRI in 2001 - most consitent with simple pineal cyst   HSV infection    Hyperlipidemia    Hypertension    Hypothyroidism    with hx of multinodular goiter (noted on neck US  in 11/2002 - showing diffuse nodularity and inhomogenous texture diffusely BL)   Internal hemorrhoids    grade 2 per colonoscopy in 09/2006 - repeat colonoscopy rec  in 5-10 years.   Migraine    Nevus    Personal history of failed conscious sedation 08/11/2011   Sleep apnea    does not use cpap   Stroke (HCC) 10/2000   Tachycardia    Thyroid  disease    Tobacco abuse 04/2012   quit    Past Surgical History:  Procedure Laterality Date   ADENOIDECTOMY     APPENDECTOMY  2017   BALLOON DILATION N/A 06/14/2020   Procedure: BALLOON DILATION;  Surgeon: Wilhelmenia Aloha Raddle., MD;  Location: [facility] ENDOSCOPY;  Service: Gastroenterology;  Laterality: N/A;   BIOPSY  02/15/2020   Procedure: BIOPSY;  Surgeon: Burnette Fallow, MD;  Location: [facility] ENDOSCOPY;  Service: Endoscopy;;   BIOPSY  06/14/2020   Procedure: BIOPSY;  Surgeon: Wilhelmenia Aloha Raddle., MD;  Location: [facility] ENDOSCOPY;  Service: Gastroenterology;;   BIOPSY  03/02/2023   Procedure: BIOPSY;  Surgeon: Saintclair Jasper, MD;  Location: [facility] ENDOSCOPY;  Service: Gastroenterology;;   BRONCHIAL BIOPSY  03/15/2020   Procedure: BRONCHIAL BIOPSIES;  Surgeon: Brenna Adine LITTIE, DO;  Location: [facility] ENDOSCOPY;  Service: Pulmonary;;   BRONCHIAL BRUSHINGS  03/15/2020   Procedure: BRONCHIAL BRUSHINGS;  Surgeon: Brenna Adine LITTIE, DO;  Location: [facility] ENDOSCOPY;  Service: Pulmonary;;   BRONCHIAL NEEDLE ASPIRATION BIOPSY  03/15/2020   Procedure: BRONCHIAL NEEDLE ASPIRATION BIOPSIES;  Surgeon: Brenna Adine CROME, DO;  Location: [facility] ENDOSCOPY;  Service: Pulmonary;;   BRONCHIAL WASHINGS  03/15/2020   Procedure: BRONCHIAL WASHINGS;  Surgeon: Brenna Adine CROME, DO;  Location: [facility] ENDOSCOPY;  Service: Pulmonary;;   CHOLECYSTECTOMY     COLONOSCOPY  02/07/2010   COLONOSCOPY WITH PROPOFOL  N/A 02/15/2020   Procedure: COLONOSCOPY WITH PROPOFOL ;  Surgeon: Burnette Fallow, MD;  Location: [facility] ENDOSCOPY;  Service: Endoscopy;  Laterality: N/A;   CORONARY IMAGING/OCT N/A 12/23/2023   Procedure: CORONARY IMAGING/OCT;  Surgeon: Anner Alm ORN, MD;  Location: [facility] INVASIVE CV LAB;  Service: Cardiovascular;  Laterality: N/A;   CORONARY STENT INTERVENTION N/A  12/23/2023   Procedure: CORONARY STENT INTERVENTION;  Surgeon: Anner Alm ORN, MD;  Location: [facility] INVASIVE CV LAB;  Service: Cardiovascular;  Laterality: N/A;   DILATION AND CURETTAGE OF UTERUS     DILATION AND CURETTAGE OF UTERUS N/A 10/19/2023   Procedure: DILATION AND CURETTAGE;  Surgeon: Jeralyn Crutch, MD;  Location: [facility] OR;  Service: Gynecology;  Laterality: N/A;  ANESTHESIA REQUEST TO BE UNDER IV CONSCIOUS SEDATION LIKE FOR A COLONOSCOPY IF POSSIBLE   ENDOSCOPIC MUCOSAL RESECTION N/A 06/14/2020   Procedure: ENDOSCOPIC MUCOSAL RESECTION;  Surgeon: Wilhelmenia Aloha Raddle., MD;  Location: [facility] ENDOSCOPY;  Service: Gastroenterology;  Laterality: N/A;   ESOPHAGOGASTRODUODENOSCOPY (EGD) WITH PROPOFOL  N/A 02/15/2020   Procedure: ESOPHAGOGASTRODUODENOSCOPY (EGD) WITH PROPOFOL ;  Surgeon: Burnette Fallow, MD;  Location: [facility] ENDOSCOPY;  Service: Endoscopy;  Laterality: N/A;   ESOPHAGOGASTRODUODENOSCOPY (EGD) WITH PROPOFOL  N/A 06/14/2020   Procedure: ESOPHAGOGASTRODUODENOSCOPY (EGD) WITH PROPOFOL ;  Surgeon: Wilhelmenia Aloha Raddle., MD;  Location: [facility] ENDOSCOPY;  Service: Gastroenterology;  Laterality: N/A;   ESOPHAGOGASTRODUODENOSCOPY (EGD) WITH PROPOFOL  Left 03/02/2023   Procedure: ESOPHAGOGASTRODUODENOSCOPY (EGD) WITH PROPOFOL ;  Surgeon: Saintclair Jasper, MD;  Location: [facility] ENDOSCOPY;  Service: Gastroenterology;  Laterality: Left;   FOREIGN BODY REMOVAL  06/14/2020   Procedure: FOREIGN BODY REMOVAL;  Surgeon: Wilhelmenia Aloha Raddle., MD;  Location: [facility] ENDOSCOPY;  Service: Gastroenterology;;   HYSTEROSCOPY     LAPAROSCOPIC APPENDECTOMY N/A 09/23/2015   Procedure: APPENDECTOMY LAPAROSCOPIC;  Surgeon: Dann Hummer, MD;  Location: [facility] OR;  Service: General;  Laterality: N/A;   LEFT HEART CATH AND CORONARY ANGIOGRAPHY N/A 12/23/2023   Procedure: LEFT HEART CATH AND CORONARY ANGIOGRAPHY;  Surgeon: Anner Alm ORN, MD;  Location: [facility] INVASIVE CV LAB;  Service: Cardiovascular;  Laterality: N/A;   MYOMECTOMY  1990, 1997   x  2 - In 1997, noted to have extensive pelvic adhesions and BL tubal obstruction   POLYPECTOMY  02/15/2020   Procedure: POLYPECTOMY;  Surgeon: Burnette Fallow, MD;  Location: [facility] ENDOSCOPY;  Service: Endoscopy;;   TONSILLECTOMY     TRANSTHORACIC ECHOCARDIOGRAM  06/2014    EF 55-60%.  No RWMA. Gr 1 DD. Cannot R/o PFO (consider bubble study).   UPPER ESOPHAGEAL ENDOSCOPIC ULTRASOUND (EUS) N/A 06/14/2020   Procedure: UPPER ESOPHAGEAL ENDOSCOPIC ULTRASOUND (EUS);  Surgeon: Wilhelmenia Aloha Raddle., MD;  Location: [facility] ENDOSCOPY;  Service: Gastroenterology;  Laterality: N/A;   UPPER GASTROINTESTINAL ENDOSCOPY  12/18/2009   VIDEO BRONCHOSCOPY WITH ENDOBRONCHIAL NAVIGATION N/A 03/15/2020   Procedure: VIDEO BRONCHOSCOPY WITH ENDOBRONCHIAL NAVIGATION;  Surgeon: Brenna Adine CROME, DO;  Location: [facility] ENDOSCOPY;  Service: Pulmonary;  Laterality: N/A;   VIDEO BRONCHOSCOPY WITH ENDOBRONCHIAL ULTRASOUND  03/15/2020   Procedure: VIDEO BRONCHOSCOPY WITH ENDOBRONCHIAL ULTRASOUND;  Surgeon: Brenna Adine CROME, DO;  Location: [facility] ENDOSCOPY;  Service: Pulmonary;;   WISDOM TOOTH EXTRACTION      Family History  Problem Relation Age of Onset   COPD  Mother    Heart disease Father 83       had 5 heart attacks, first in late 67s   COPD Father    Colon cancer Neg Hx    Esophageal cancer Neg Hx    Inflammatory bowel disease Neg Hx    Liver disease Neg Hx    Pancreatic cancer Neg Hx    Rectal cancer Neg Hx    Stomach cancer Neg Hx    Thyroid  disease Neg Hx     Social History:  reports that she quit smoking about 8 years ago. Her smoking use included cigarettes. She started smoking about 58 years ago. She has a 150 pack-year smoking history. She has never used smokeless tobacco. She reports that she does not drink alcohol and does not use drugs.  Allergies:  Allergies  Allergen Reactions   Amlodipine      Cramps    Codeine     Headaches    Gabapentin      headache   Prozac [Fluoxetine Hcl] Other (See Comments)    Suicidal  ideations    Medications: I have reviewed the patient's current medications.  Results for orders placed or performed during the hospital encounter of 12/29/23 (from the past 48 hours)  Basic metabolic panel     Status: Abnormal   Collection Time: 12/29/23  7:01 PM  Result Value Ref Range   Sodium 140 135 - 145 mmol/L   Potassium 3.5 3.5 - 5.1 mmol/L   Chloride 102 98 - 111 mmol/L   CO2 26 22 - 32 mmol/L   Glucose, Bld 150 (H) 70 - 99 mg/dL    Comment: Glucose reference range applies only to samples taken after fasting for at least 8 hours.   BUN 20 8 - 23 mg/dL   Creatinine, Ser 9.13 0.44 - 1.00 mg/dL   Calcium  9.0 8.9 - 10.3 mg/dL   GFR, Estimated >39 >39 mL/min    Comment: (NOTE) Calculated using the CKD-EPI Creatinine Equation (2021)    Anion gap 12 5 - 15    Comment: Performed at [facility], [address], [place], [place] [address]  CBC     Status: Abnormal   Collection Time: 12/29/23  7:01 PM  Result Value Ref Range   WBC 8.9 4.0 - 10.5 K/uL   RBC 3.18 (L) 3.87 - 5.11 MIL/uL   Hemoglobin 9.4 (L) 12.0 - 15.0 g/dL   HCT 68.2 (L) 63.9 - 53.9 %   MCV 99.7 80.0 - 100.0 fL   MCH 29.6 26.0 - 34.0 pg   MCHC 29.7 (L) 30.0 - 36.0 g/dL   RDW 86.4 88.4 - 84.4 %   Platelets 269 150 - 400 K/uL   nRBC 0.0 0.0 - 0.2 %    Comment: Performed at [facility], [address] [place], [place] [address]  Troponin I (High Sensitivity)     Status: Abnormal   Collection Time: 12/29/23  7:01 PM  Result Value Ref Range   Troponin I (High Sensitivity) 156 (HH) <18 ng/L    Comment: CRITICAL RESULT CALLED TO, READ BACK BY AND VERIFIED WITH JINNY BARS RN 2016 12/29/2023 WBOND (NOTE) Elevated high sensitivity troponin I (hsTnI) values and significant  changes across serial measurements may suggest ACS but many other  chronic and acute conditions are known to elevate hsTnI results.  Refer to the Links section for chest pain algorithms and additional  guidance. Performed at  [facility], [address]  [place], [place] [address]   Protime-INR     Status: None   Collection Time: 12/29/23  7:01 PM  Result Value Ref Range   Prothrombin Time 14.5 11.4 - 15.2 seconds   INR 1.1 0.8 - 1.2    Comment: (NOTE) INR goal varies based on device and disease states. Performed at [facility], [address], [place], [place] [address]   I-stat chem 8, ED (not at [facility], [facility] or [facility])     Status: Abnormal   Collection Time: 12/29/23  7:10 PM  Result Value Ref Range   Sodium 141 135 - 145 mmol/L   Potassium 3.4 (L) 3.5 - 5.1 mmol/L   Chloride 101 98 - 111 mmol/L   BUN 21 8 - 23 mg/dL   Creatinine, Ser 9.19 0.44 - 1.00 mg/dL   Glucose, Bld 846 (H) 70 - 99 mg/dL    Comment: Glucose reference range applies only to samples taken after fasting for at least 8 hours.   Calcium , Ion 1.10 (L) 1.15 - 1.40 mmol/L   TCO2 26 22 - 32 mmol/L   Hemoglobin 10.2 (L) 12.0 - 15.0 g/dL   HCT 69.9 (L) 63.9 - 53.9 %  Troponin I (High Sensitivity)     Status: Abnormal   Collection Time: 12/29/23  8:50 PM  Result Value Ref Range   Troponin I (High Sensitivity) 185 (HH) <18 ng/L    Comment: CRITICAL VALUE NOTED. VALUE IS CONSISTENT WITH PREVIOUSLY REPORTED/CALLED VALUE (NOTE) Elevated high sensitivity troponin I (hsTnI) values and significant  changes across serial measurements may suggest ACS but many other  chronic and acute conditions are known to elevate hsTnI results.  Refer to the Links section for chest pain algorithms and additional  guidance. Performed at [facility], [address], [place], [place] [address]   Lactic acid, plasma     Status: None   Collection Time: 12/29/23  8:50 PM  Result Value Ref Range   Lactic Acid, Venous 1.2 0.5 - 1.9 mmol/L    Comment: Performed at [facility], [address] [place], [place] [address]  POC occult blood, ED     Status: Abnormal   Collection Time: 12/29/23  9:54 PM  Result Value Ref Range   Fecal Occult  Bld POSITIVE (A) NEGATIVE  Hemoglobin and hematocrit, blood     Status: Abnormal   Collection Time: 12/29/23 10:49 PM  Result Value Ref Range   Hemoglobin 8.3 (L) 12.0 - 15.0 g/dL   HCT 72.5 (L) 63.9 - 53.9 %    Comment: Performed at [facility], [address] [place], [place] [address]  Lipase, blood     Status: None   Collection Time: 12/29/23 10:49 PM  Result Value Ref Range   Lipase 34 11 - 51 U/L    Comment: Performed at [facility], [address], [place], [place] [address]  Hepatic function panel     Status: Abnormal   Collection Time: 12/29/23 10:49 PM  Result Value Ref Range   Total Protein 5.1 (L) 6.5 - 8.1 g/dL   Albumin 2.6 (L) 3.5 - 5.0 g/dL   AST 26 15 - 41 U/L   ALT 18 0 - 44 U/L   Alkaline Phosphatase 51 38 - 126 U/L   Total Bilirubin 0.4 0.0 - 1.2 mg/dL   Bilirubin, Direct <9.8 0.0 - 0.2 mg/dL    Comment: REPEATED TO VERIFY   Indirect Bilirubin NOT CALCULATED 0.3 -  0.9 mg/dL    Comment: Performed at [facility], [address] [place], [place] [address]  Prepare RBC (crossmatch)     Status: None   Collection Time: 12/29/23 11:31 PM  Result Value Ref Range   Order Confirmation      ORDER PROCESSED BY BLOOD BANK Performed at [facility], [address], [place], [place] [address]   Type and screen [facility]     Status: None (Preliminary result)   Collection Time: 12/29/23 11:52 PM  Result Value Ref Range   ABO/RH(D) O POS    Antibody Screen NEG    Sample Expiration 01/01/2024,2359    Unit Number [id number]    Blood Component Type RBC LR PHER1    Unit division 00    Status of Unit ISSUED,FINAL    Unit tag comment EMERGENCY RELEASE    Transfusion Status OK TO TRANSFUSE    Crossmatch Result COMPATIBLE    Unit Number [id number]    Blood Component Type RED CELLS,LR    Unit division 00    Status of Unit ISSUED    Transfusion Status OK TO TRANSFUSE    Crossmatch Result Compatible    Unit Number  [id number]    Blood Component Type RBC LR PHER1    Unit division 00    Status of Unit ALLOCATED    Transfusion Status OK TO TRANSFUSE    Crossmatch Result Compatible    Unit Number [id number]    Blood Component Type RED CELLS,LR    Unit division 00    Status of Unit ALLOCATED    Transfusion Status OK TO TRANSFUSE    Crossmatch Result Compatible   Prepare RBC (crossmatch)     Status: None   Collection Time: 12/30/23  1:40 AM  Result Value Ref Range   Order Confirmation      ORDER PROCESSED BY BLOOD BANK Performed at [facility], [address], [place], [place] [address]   Hemoglobin and hematocrit, blood     Status: Abnormal   Collection Time: 12/30/23  3:16 AM  Result Value Ref Range   Hemoglobin 10.9 (L) 12.0 - 15.0 g/dL    Comment: REPEATED TO VERIFY   HCT 34.7 (L) 36.0 - 46.0 %    Comment: Performed at [facility], [address], [place], [place] [address]  Troponin I (High Sensitivity)     Status: Abnormal   Collection Time: 12/30/23  3:16 AM  Result Value Ref Range   Troponin I (High Sensitivity) 165 (HH) <18 ng/L    Comment: CRITICAL VALUE NOTED. VALUE IS CONSISTENT WITH PREVIOUSLY REPORTED/CALLED VALUE (NOTE) Elevated high sensitivity troponin I (hsTnI) values and significant  changes across serial measurements may suggest ACS but many other  chronic and acute conditions are known to elevate hsTnI results.  Refer to the Links section for chest pain algorithms and additional  guidance. Performed at [facility], [address], [place], [place] [address]   Hemoglobin A1c     Status: Abnormal   Collection Time: 12/30/23  3:16 AM  Result Value Ref Range   Hgb A1c MFr Bld 5.7 (H) 4.8 - 5.6 %    Comment: (NOTE) Diagnosis of Diabetes The following HbA1c ranges recommended by the American Diabetes Association (ADA) may be used as an aid in the diagnosis of diabetes mellitus.  Hemoglobin             Suggested A1C NGSP%  Diagnosis  <5.7                   Non Diabetic  5.7-6.4                Pre-Diabetic  >6.4                   Diabetic  <7.0                   Glycemic control for                       adults with diabetes.     Mean Plasma Glucose 116.89 mg/dL    Comment: Performed at [facility], [address] [place], [place] [address]  Heparin  level (unfractionated)     Status: Abnormal   Collection Time: 12/30/23  5:54 AM  Result Value Ref Range   Heparin  Unfractionated <0.10 (L) 0.30 - 0.70 IU/mL    Comment: (NOTE) The clinical reportable range upper limit is being lowered to >1.10 to align with the FDA approved guidance for the current laboratory assay.  If heparin  results are below expected values, and patient dosage has  been confirmed, suggest follow up testing of antithrombin III levels. Performed at [facility], [address] [place], [place] [address]   Hemoglobin and hematocrit, blood     Status: Abnormal   Collection Time: 12/30/23  5:54 AM  Result Value Ref Range   Hemoglobin 10.1 (L) 12.0 - 15.0 g/dL   HCT 67.4 (L) 63.9 - 53.9 %    Comment: Performed at [facility], [address], [place], [place] [address]  Troponin I (High Sensitivity)     Status: Abnormal   Collection Time: 12/30/23  5:54 AM  Result Value Ref Range   Troponin I (High Sensitivity) 186 (HH) <18 ng/L    Comment: CRITICAL VALUE NOTED. VALUE IS CONSISTENT WITH PREVIOUSLY REPORTED/CALLED VALUE (NOTE) Elevated high sensitivity troponin I (hsTnI) values and significant  changes across serial measurements may suggest ACS but many other  chronic and acute conditions are known to elevate hsTnI results.  Refer to the Links section for chest pain algorithms and additional  guidance. Performed at [facility], [address] [place], [place] [address]   Lactic acid, plasma     Status: None   Collection Time: 12/30/23  5:55 AM  Result Value Ref Range   Lactic Acid, Venous 0.7  0.5 - 1.9 mmol/L    Comment: Performed at [facility], [address], [place], [place] [address]  CBG monitoring, ED     Status: Abnormal   Collection Time: 12/30/23  7:54 AM  Result Value Ref Range   Glucose-Capillary 125 (H) 70 - 99 mg/dL    Comment: Glucose reference range applies only to samples taken after fasting for at least 8 hours.  Hemoglobin and hematocrit, blood     Status: Abnormal   Collection Time: 12/30/23  9:29 AM  Result Value Ref Range   Hemoglobin 9.7 (L) 12.0 - 15.0 g/dL   HCT 69.2 (L) 63.9 - 53.9 %    Comment: Performed at [facility], [address] [place], [place] [address]    CT ANGIO GI BLEED Result Date: 12/30/2023 CLINICAL DATA:  Lower GI hemorrhage on blood thinners EXAM: CTA ABDOMEN AND PELVIS WITHOUT AND WITH CONTRAST TECHNIQUE: Multidetector CT imaging of the abdomen and pelvis was  performed using the standard protocol during bolus administration of intravenous contrast. Multiplanar reconstructed images and MIPs were obtained and reviewed to evaluate the vascular anatomy. RADIATION DOSE REDUCTION: This exam was performed according to the departmental dose-optimization program which includes automated exposure control, adjustment of the mA and/or kV according to patient size and/or use of iterative reconstruction technique. CONTRAST:  75mL OMNIPAQUE  IOHEXOL  350 MG/ML SOLN COMPARISON:  None Available. FINDINGS: VASCULAR Aorta: Atherosclerotic calcifications are noted without aneurysmal dilatation or dissection. Celiac: Patent without evidence of aneurysm, dissection, vasculitis or significant stenosis. SMA: Patent without evidence of aneurysm, dissection, vasculitis or significant stenosis. Renals: Both renal arteries are patent without evidence of aneurysm, dissection, vasculitis, fibromuscular dysplasia or significant stenosis. IMA: Patent without evidence of aneurysm, dissection, vasculitis or significant stenosis. Inflow: Iliacs demonstrate  atherosclerotic calcification without aneurysmal dilatation or dissection. Veins: No specific venous abnormality is noted. Review of the MIP images confirms the above findings. NON-VASCULAR Lower chest: Mild scarring is noted in the bases bilaterally. Hepatobiliary: No focal liver abnormality is seen. Status post cholecystectomy. No biliary dilatation. Pancreas: Unremarkable. No pancreatic ductal dilatation or surrounding inflammatory changes. Spleen: Splenic cyst is noted.  No other focal abnormality is noted. Adrenals/Urinary Tract: Adrenal glands are within normal limits. Kidneys demonstrate renal cystic change bilaterally. No follow-up is recommended. No renal calculi or obstructive changes are seen. The bladder is partially distended. Stomach/Bowel: Diverticular change of the colon is noted without diverticulitis. No obstructive changes are seen in the colon. The appendix has been surgically removed. Small bowel is within normal limits. Stomach is distended with ingested fluid. No findings to suggest active GI hemorrhage are seen. No pooling of contrast is noted. Lymphatic: No lymphadenopathy is noted. Reproductive: Uterus is well visualized with multiple exophytic fibroids. No discrete adnexal mass is noted. Other: No abdominal wall hernia or abnormality. No abdominopelvic ascites. Musculoskeletal: No acute or significant osseous findings. IMPRESSION: VASCULAR No findings to suggest active GI hemorrhage. Atherosclerotic calcifications. NON-VASCULAR Diverticulosis without diverticulitis. Multiple uterine fibroids. Electronically Signed   By: Oneil Devonshire M.D.   On: 12/30/2023 00:48   DG Chest 2 View Result Date: 12/29/2023 CLINICAL DATA:  chestpain EXAM: CHEST - 2 VIEW COMPARISON:  Chest x-ray 01/09/2023. FINDINGS: The heart and mediastinal contours are unchanged. Atherosclerotic plaque. No focal consolidation. No pulmonary edema. No pleural effusion. No pneumothorax. No acute osseous abnormality.  IMPRESSION: 1. No active cardiopulmonary disease. 2.  Aortic Atherosclerosis (ICD10-I70.0). Electronically Signed   By: Morgane  Naveau M.D.   On: 12/29/2023 19:38    Review of Systems negative except above Blood pressure 131/76, pulse 90, temperature 97.9 F (36.6 C), temperature source Oral, resp. rate (!) 23, height 5' 3 (1.6 m), weight 80.3 kg, last menstrual period 02/27/2014, SpO2 (!) 89%. Physical Exam vital signs stable afebrile no acute distress abdomen is soft nontender CTA negative no significant finding BUN and creatinine okay although higher BUN than previous hospital stay liver test normal troponin increased Hemoglobin  drop from 12.7-8.3 now at 10.9 status posttransfusion Assessment/Plan: Upper GI bleed in patient on aspirin  and Plavix  unable to come off per cardiology Plan: Will allow clear liquids continue twice daily pump inhibitors we will proceed with endoscopy if signs of ongoing bleeding continue despite need for above medicines  Alan Riles E 12/30/2023, 12:29 PM

## 2023-12-30 NOTE — ED Notes (Signed)
 Pt provided meal tray- echo at bedside.

## 2023-12-30 NOTE — H&P (Signed)
 History and Physical    Grace [name] FMW:[id number] DOB: 1955/10/15 DOA: 12/29/2023  PCP: Delores Rojelio Caldron, NP   Patient coming from: Home   Chief Complaint:  Chief Complaint  Patient presents with   Chest Pain    HPI:  Grace [name] is a 68 y.o. female with hx of CAD with persistent anginal symptoms who underwent recent LHC and PCI with RCA DES on 9/10 and started on DAPT; additional history including CVA, hypertension, hyperlipidemia, diabetes, hypothyroidism, OSA, COPD and GERD; who presents with persistent epigastric pain. Reports that she developed essentially constant epigastric pain since Saturday 9/13.  And noting yesterday having an episode of vomiting with black collard greens looking material x 1 episode.  Although she has not noted any dark stools noted to have melena on ED exam.  She has unchanged exertional shortness of breath.  She has been adherent with DAPT with aspirin  and Plavix  daily.  Does have a past history of severe anemia requiring transfusion in 11/'24 and at that time underwent endoscopy demonstrating Barrett's esophagus, antral gastritis, duodenal stricture.   Review of Systems:  ROS complete and negative except as marked above   Allergies  Allergen Reactions   Amlodipine      Cramps    Codeine     Headaches    Gabapentin      headache   Prozac [Fluoxetine Hcl] Other (See Comments)    Suicidal ideations    Prior to Admission medications   Medication Sig Start Date End Date Taking? Authorizing Provider  albuterol  (PROAIR  HFA) 108 (90 Base) MCG/ACT inhaler INHALE 2 PUFFS EVERY 6  HOURS AS NEEDED FOR  SHORTNESS OF BREATH OR  WHEEZING 05/17/19  Yes Eubanks, Jessica K, NP  aspirin  81 MG chewable tablet Chew 81 mg by mouth daily as needed (chest pain).   Yes [provider]  bisoprolol  (ZEBETA ) 10 MG tablet Take 1 & 1/2  tablets (15 mg total) by mouth daily. 12/08/23  Yes Daneen Damien BROCKS, NP  clopidogrel  (PLAVIX ) 75 MG tablet Take 1 tablet (75  mg total) by mouth daily with breakfast. 12/25/23  Yes Henry Shaver B, NP  diphenhydrAMINE  (BENADRYL ) 25 MG tablet Take 50 mg by mouth at bedtime as needed for sleep.   Yes [provider]  docusate sodium  (COLACE) 100 MG capsule Take 300 mg by mouth daily.   Yes [provider]  estradiol  (ESTRACE  VAGINAL) 0.1 MG/GM vaginal cream Place 1 Applicatorful vaginally at bedtime. Patient taking differently: Place 1 Applicatorful vaginally 2 (two) times a week. 08/19/19  Yes Caro Harlene POUR, NP  ezetimibe  (ZETIA ) 10 MG tablet Take 10 mg by mouth daily. 09/29/22  Yes [provider]  furosemide  (LASIX ) 40 MG tablet TAKE 1 TABLET BY MOUTH DAILY AS  NEEDED FOR WEIGHT GAIN OF 3 LB  OVER AT NIGHT OR 5 LB IN 1 WEEK 08/31/23  Yes Monge, Damien BROCKS, NP  hydrALAZINE  (APRESOLINE ) 50 MG tablet Take 1 tablet (50 mg total) by mouth 3 (three) times daily. May take an additional 50 mg if needed for blood pressureif still elevated is greater than or equal to 160 systolic after 1 hour recheck of blood pressure 08/27/23  Yes Anner Alm ORN, MD  ipratropium-albuterol  (DUONEB) 0.5-2.5 (3) MG/3ML SOLN Take 3 mLs by nebulization every 6 (six) hours as needed. 02/07/19  Yes Kassie Acquanetta Bradley, MD  isosorbide  mononitrate (IMDUR ) 30 MG 24 hr tablet Take 3 tablets (90 mg total) by mouth daily. 10/07/23  Yes Anner Alm  W, MD  levothyroxine  (SYNTHROID ) 200 MCG tablet Take 200 mcg by mouth at bedtime.   Yes [provider]  losartan  (COZAAR ) 100 MG tablet TAKE 1 TABLET BY MOUTH  DAILY Patient taking differently: Take 100 mg by mouth at bedtime. 07/20/19  Yes Caro Harlene POUR, NP  nitroGLYCERIN  (NITROSTAT ) 0.4 MG SL tablet Place 0.4 mg under the tongue every 5 (five) minutes as needed for chest pain. 08/23/23  Yes [provider]  OZEMPIC, 0.25 OR 0.5 MG/DOSE, 2 MG/3ML SOPN Inject 0.5 mg into the skin once a week. 08/30/23  Yes [provider]  pantoprazole  (PROTONIX ) 40 MG tablet  Take 1 tablet (40 mg total) by mouth daily. 12/24/23  Yes Henry Shaver B, NP  pregabalin  (LYRICA ) 50 MG capsule Take 50 mg by mouth daily. 10/13/22  Yes [provider]  REPATHA  SURECLICK 140 MG/ML SOAJ INJECT 1 PEN SUBCUTANEOUSLY  EVERY 2 WEEKS 02/20/23  Yes Anner Alm ORN, MD  traMADol  (ULTRAM ) 50 MG tablet Take 50 mg by mouth every 6 (six) hours as needed for moderate pain (pain score 4-6) or severe pain (pain score 7-10). 03/16/23  Yes [provider]  valACYclovir  (VALTREX ) 500 MG tablet TAKE 1 TABLET BY MOUTH  DAILY 11/23/19  Yes Caro Harlene POUR, NP  Vibegron  75 MG TABS Take 1 tablet (75 mg total) by mouth daily. 07/24/23  Yes Zuleta, Kaitlin G, NP  calcium  carbonate (TUMS - DOSED IN MG ELEMENTAL CALCIUM ) 500 MG chewable tablet Chew 1 tablet by mouth daily as needed for indigestion or heartburn.    [provider]  fluticasone  (FLONASE ) 50 MCG/ACT nasal spray USE 2 SPRAYS IN BOTH NOSTRILS  DAILY 09/29/23   Cobb, Comer GAILS, NP  glucose blood (ONETOUCH VERIO) test strip Use to check blood sugar twice daily. Dx: E11.9 08/25/18   Caro Harlene POUR, NP  Lancets (ONETOUCH DELICA PLUS LANCET33G) MISC USE TO CHECK BLOOD SUGAR  TWICE DAILY 04/01/19   Caro Harlene POUR, NP  Respiratory Therapy Supplies (NEBULIZER/TUBING/MOUTHPIECE) KIT 1 application by Does not apply route every 6 (six) hours as needed. FAX TO [phone] 07/19/19   Eubanks, Jessica K, NP  ipratropium (ATROVENT  HFA) 17 MCG/ACT inhaler Inhale 2 puffs into the lungs 4 (four) times daily. 04/25/11 06/30/11  Rulon Adine ORN, MD    Past Medical History:  Diagnosis Date   Abnormal vaginal bleeding    Anxiety    Asthma    Barrett's esophagus    with high grade dysplasia per endoscopy 05/2005 // followed by Dr. Lupita Commander ([facility])   Chronic back pain    Complication of anesthesia    per patient she went in respiratory failure in recovery room in 2017   COPD (chronic obstructive pulmonary disease) (HCC)     Diabetes mellitus without complication (HCC)    type 2   Duodenal ulcer disease    thought to be contributed at least partly by overuse of NSAIDs and salicylates (goody powdr)   Endometrial polyp 03/2003   s/p resection in 03/2003. Path showing submucosal leiomyoma and benign  proliferative type endometrium   Family history of premature CAD 05/19/2018   Fibroid uterus    s/p myomectomy x 2   GERD (gastroesophageal reflux disease)    Hiatal hernia    History of CVA (cerebrovascular accident) 10/2000   History of pineal cyst    11 mm cystic mass noted in the pineal gland per MRI in 2001 - most consitent with simple pineal cyst  HSV infection    Hyperlipidemia    Hypertension    Hypothyroidism    with hx of multinodular goiter (noted on neck US  in 11/2002 - showing diffuse nodularity and inhomogenous texture diffusely BL)   Internal hemorrhoids    grade 2 per colonoscopy in 09/2006 - repeat colonoscopy rec in 5-10 years.   Migraine    Nevus    Personal history of failed conscious sedation 08/11/2011   Sleep apnea    does not use cpap   Stroke (HCC) 10/2000   Tachycardia    Thyroid  disease    Tobacco abuse 04/2012   quit    Past Surgical History:  Procedure Laterality Date   ADENOIDECTOMY     APPENDECTOMY  2017   BALLOON DILATION N/A 06/14/2020   Procedure: BALLOON DILATION;  Surgeon: Wilhelmenia Aloha Raddle., MD;  Location: [facility] ENDOSCOPY;  Service: Gastroenterology;  Laterality: N/A;   BIOPSY  02/15/2020   Procedure: BIOPSY;  Surgeon: Burnette Fallow, MD;  Location: [facility] ENDOSCOPY;  Service: Endoscopy;;   BIOPSY  06/14/2020   Procedure: BIOPSY;  Surgeon: Wilhelmenia Aloha Raddle., MD;  Location: [facility] ENDOSCOPY;  Service: Gastroenterology;;   BIOPSY  03/02/2023   Procedure: BIOPSY;  Surgeon: Saintclair Jasper, MD;  Location: [facility] ENDOSCOPY;  Service: Gastroenterology;;   BRONCHIAL BIOPSY  03/15/2020   Procedure: BRONCHIAL BIOPSIES;  Surgeon: Brenna Adine CROME, DO;  Location: [facility] ENDOSCOPY;   Service: Pulmonary;;   BRONCHIAL BRUSHINGS  03/15/2020   Procedure: BRONCHIAL BRUSHINGS;  Surgeon: Brenna Adine CROME, DO;  Location: [facility] ENDOSCOPY;  Service: Pulmonary;;   BRONCHIAL NEEDLE ASPIRATION BIOPSY  03/15/2020   Procedure: BRONCHIAL NEEDLE ASPIRATION BIOPSIES;  Surgeon: Brenna Adine CROME, DO;  Location: [facility] ENDOSCOPY;  Service: Pulmonary;;   BRONCHIAL WASHINGS  03/15/2020   Procedure: BRONCHIAL WASHINGS;  Surgeon: Brenna Adine CROME, DO;  Location: [facility] ENDOSCOPY;  Service: Pulmonary;;   CHOLECYSTECTOMY     COLONOSCOPY  02/07/2010   COLONOSCOPY WITH PROPOFOL  N/A 02/15/2020   Procedure: COLONOSCOPY WITH PROPOFOL ;  Surgeon: Burnette Fallow, MD;  Location: [facility] ENDOSCOPY;  Service: Endoscopy;  Laterality: N/A;   CORONARY IMAGING/OCT N/A 12/23/2023   Procedure: CORONARY IMAGING/OCT;  Surgeon: Anner Alm ORN, MD;  Location: [facility] INVASIVE CV LAB;  Service: Cardiovascular;  Laterality: N/A;   CORONARY STENT INTERVENTION N/A 12/23/2023   Procedure: CORONARY STENT INTERVENTION;  Surgeon: Anner Alm ORN, MD;  Location: [facility] INVASIVE CV LAB;  Service: Cardiovascular;  Laterality: N/A;   DILATION AND CURETTAGE OF UTERUS     DILATION AND CURETTAGE OF UTERUS N/A 10/19/2023   Procedure: DILATION AND CURETTAGE;  Surgeon: Jeralyn Crutch, MD;  Location: [facility] OR;  Service: Gynecology;  Laterality: N/A;  ANESTHESIA REQUEST TO BE UNDER IV CONSCIOUS SEDATION LIKE FOR A COLONOSCOPY IF POSSIBLE   ENDOSCOPIC MUCOSAL RESECTION N/A 06/14/2020   Procedure: ENDOSCOPIC MUCOSAL RESECTION;  Surgeon: Wilhelmenia Aloha Raddle., MD;  Location: [facility] ENDOSCOPY;  Service: Gastroenterology;  Laterality: N/A;   ESOPHAGOGASTRODUODENOSCOPY (EGD) WITH PROPOFOL  N/A 02/15/2020   Procedure: ESOPHAGOGASTRODUODENOSCOPY (EGD) WITH PROPOFOL ;  Surgeon: Burnette Fallow, MD;  Location: [facility] ENDOSCOPY;  Service: Endoscopy;  Laterality: N/A;   ESOPHAGOGASTRODUODENOSCOPY (EGD) WITH PROPOFOL  N/A 06/14/2020   Procedure: ESOPHAGOGASTRODUODENOSCOPY (EGD) WITH PROPOFOL ;   Surgeon: Wilhelmenia Aloha Raddle., MD;  Location: [facility] ENDOSCOPY;  Service: Gastroenterology;  Laterality: N/A;   ESOPHAGOGASTRODUODENOSCOPY (EGD) WITH PROPOFOL  Left 03/02/2023   Procedure: ESOPHAGOGASTRODUODENOSCOPY (EGD) WITH PROPOFOL ;  Surgeon: Saintclair Jasper, MD;  Location: [facility] ENDOSCOPY;  Service: Gastroenterology;  Laterality: Left;   FOREIGN BODY REMOVAL  06/14/2020  Procedure: FOREIGN BODY REMOVAL;  Surgeon: Wilhelmenia Aloha Raddle., MD;  Location: [facility] ENDOSCOPY;  Service: Gastroenterology;;   HYSTEROSCOPY     LAPAROSCOPIC APPENDECTOMY N/A 09/23/2015   Procedure: APPENDECTOMY LAPAROSCOPIC;  Surgeon: Dann Hummer, MD;  Location: [facility] OR;  Service: General;  Laterality: N/A;   LEFT HEART CATH AND CORONARY ANGIOGRAPHY N/A 12/23/2023   Procedure: LEFT HEART CATH AND CORONARY ANGIOGRAPHY;  Surgeon: Anner Alm ORN, MD;  Location: [facility] INVASIVE CV LAB;  Service: Cardiovascular;  Laterality: N/A;   MYOMECTOMY  1990, 1997   x 2 - In 1997, noted to have extensive pelvic adhesions and BL tubal obstruction   POLYPECTOMY  02/15/2020   Procedure: POLYPECTOMY;  Surgeon: Burnette Fallow, MD;  Location: [facility] ENDOSCOPY;  Service: Endoscopy;;   TONSILLECTOMY     TRANSTHORACIC ECHOCARDIOGRAM  06/2014    EF 55-60%.  No RWMA. Gr 1 DD. Cannot R/o PFO (consider bubble study).   UPPER ESOPHAGEAL ENDOSCOPIC ULTRASOUND (EUS) N/A 06/14/2020   Procedure: UPPER ESOPHAGEAL ENDOSCOPIC ULTRASOUND (EUS);  Surgeon: Wilhelmenia Aloha Raddle., MD;  Location: [facility] ENDOSCOPY;  Service: Gastroenterology;  Laterality: N/A;   UPPER GASTROINTESTINAL ENDOSCOPY  12/18/2009   VIDEO BRONCHOSCOPY WITH ENDOBRONCHIAL NAVIGATION N/A 03/15/2020   Procedure: VIDEO BRONCHOSCOPY WITH ENDOBRONCHIAL NAVIGATION;  Surgeon: Brenna Adine CROME, DO;  Location: [facility] ENDOSCOPY;  Service: Pulmonary;  Laterality: N/A;   VIDEO BRONCHOSCOPY WITH ENDOBRONCHIAL ULTRASOUND  03/15/2020   Procedure: VIDEO BRONCHOSCOPY WITH ENDOBRONCHIAL ULTRASOUND;  Surgeon: Brenna Adine CROME, DO;   Location: [facility] ENDOSCOPY;  Service: Pulmonary;;   WISDOM TOOTH EXTRACTION       reports that she quit smoking about 8 years ago. Her smoking use included cigarettes. She started smoking about 58 years ago. She has a 150 pack-year smoking history. She has never used smokeless tobacco. She reports that she does not drink alcohol and does not use drugs.  Family History  Problem Relation Age of Onset   COPD Mother    Heart disease Father 52       had 5 heart attacks, first in late 79s   COPD Father    Colon cancer Neg Hx    Esophageal cancer Neg Hx    Inflammatory bowel disease Neg Hx    Liver disease Neg Hx    Pancreatic cancer Neg Hx    Rectal cancer Neg Hx    Stomach cancer Neg Hx    Thyroid  disease Neg Hx      Physical Exam: Vitals:   12/30/23 0003 12/30/23 0019 12/30/23 0030 12/30/23 0100  BP: 131/77 (!) 147/74 (!) 147/74 124/78  Pulse: (!) 113 97 78 (!) 121  Resp: 17 18 20 18   Temp: 98.4 F (36.9 C) 98.5 F (36.9 C)    TempSrc: Oral Oral    SpO2: 95% 99% 98% 99%  Weight:      Height:        Gen: Awake, alert, acutely ill-appearing CV: Tachycardic, normal S1, S2, no murmurs  Resp: Normal WOB, CTAB  Abd: Flat, hyperactive, moderate diffuse tenderness, no rebound, guarding, rigidity. MSK: Symmetric, no edema  Skin: Pallor. no rashes or lesions to exposed skin  Neuro: Alert and interactive  Psych: euthymic, appropriate    Data review:   Labs reviewed, notable for:   Chemistries unremarkable High-sensitivity Trop 156 -> 185 Lactate 1.2 Hemoglobin 9.4 -> 8.3 (3 hr between check) Platelet 269 INR 1.1  Micro:  Results for orders placed or performed during the hospital encounter of 07/07/20  SARS CORONAVIRUS 2 (TAT 6-24 HRS)  Nasopharyngeal Nasopharyngeal Swab     Status: None   Collection Time: 07/07/20  2:21 PM   Specimen: Nasopharyngeal Swab  Result Value Ref Range Status   SARS Coronavirus 2 NEGATIVE NEGATIVE Final    Comment: (NOTE) SARS-CoV-2 target  nucleic acids are NOT DETECTED.  The SARS-CoV-2 RNA is generally detectable in upper and lower respiratory specimens during the acute phase of infection. Negative results do not preclude SARS-CoV-2 infection, do not rule out co-infections with other pathogens, and should not be used as the sole basis for treatment or other patient management decisions. Negative results must be combined with clinical observations, patient history, and epidemiological information. The expected result is Negative.  Fact Sheet for Patients: HairSlick.no  Fact Sheet for Healthcare Providers: quierodirigir.com  This test is not yet approved or cleared by the [place]  FDA and  has been authorized for detection and/or diagnosis of SARS-CoV-2 by FDA under an Emergency Use Authorization (EUA). This EUA will remain  in effect (meaning this test can be used) for the duration of the COVID-19 declaration under Se ction 564(b)(1) of the Act, 21 U.S.C. section 360bbb-3(b)(1), unless the authorization is terminated or revoked sooner.  Performed at [facility], [address], [place], [place] [address]     Imaging reviewed:  CT ANGIO GI BLEED Result Date: 12/30/2023 CLINICAL DATA:  Lower GI hemorrhage on blood thinners EXAM: CTA ABDOMEN AND PELVIS WITHOUT AND WITH CONTRAST TECHNIQUE: Multidetector CT imaging of the abdomen and pelvis was performed using the standard protocol during bolus administration of intravenous contrast. Multiplanar reconstructed images and MIPs were obtained and reviewed to evaluate the vascular anatomy. RADIATION DOSE REDUCTION: This exam was performed according to the departmental dose-optimization program which includes automated exposure control, adjustment of the mA and/or kV according to patient size and/or use of iterative reconstruction technique. CONTRAST:  75mL OMNIPAQUE  IOHEXOL  350 MG/ML SOLN COMPARISON:  None  Available. FINDINGS: VASCULAR Aorta: Atherosclerotic calcifications are noted without aneurysmal dilatation or dissection. Celiac: Patent without evidence of aneurysm, dissection, vasculitis or significant stenosis. SMA: Patent without evidence of aneurysm, dissection, vasculitis or significant stenosis. Renals: Both renal arteries are patent without evidence of aneurysm, dissection, vasculitis, fibromuscular dysplasia or significant stenosis. IMA: Patent without evidence of aneurysm, dissection, vasculitis or significant stenosis. Inflow: Iliacs demonstrate atherosclerotic calcification without aneurysmal dilatation or dissection. Veins: No specific venous abnormality is noted. Review of the MIP images confirms the above findings. NON-VASCULAR Lower chest: Mild scarring is noted in the bases bilaterally. Hepatobiliary: No focal liver abnormality is seen. Status post cholecystectomy. No biliary dilatation. Pancreas: Unremarkable. No pancreatic ductal dilatation or surrounding inflammatory changes. Spleen: Splenic cyst is noted.  No other focal abnormality is noted. Adrenals/Urinary Tract: Adrenal glands are within normal limits. Kidneys demonstrate renal cystic change bilaterally. No follow-up is recommended. No renal calculi or obstructive changes are seen. The bladder is partially distended. Stomach/Bowel: Diverticular change of the colon is noted without diverticulitis. No obstructive changes are seen in the colon. The appendix has been surgically removed. Small bowel is within normal limits. Stomach is distended with ingested fluid. No findings to suggest active GI hemorrhage are seen. No pooling of contrast is noted. Lymphatic: No lymphadenopathy is noted. Reproductive: Uterus is well visualized with multiple exophytic fibroids. No discrete adnexal mass is noted. Other: No abdominal wall hernia or abnormality. No abdominopelvic ascites. Musculoskeletal: No acute or significant osseous findings. IMPRESSION:  VASCULAR No findings to suggest active GI hemorrhage. Atherosclerotic calcifications. NON-VASCULAR Diverticulosis without diverticulitis. Multiple  uterine fibroids. Electronically Signed   By: Oneil Devonshire M.D.   On: 12/30/2023 00:48   DG Chest 2 View Result Date: 12/29/2023 CLINICAL DATA:  chestpain EXAM: CHEST - 2 VIEW COMPARISON:  Chest x-ray 01/09/2023. FINDINGS: The heart and mediastinal contours are unchanged. Atherosclerotic plaque. No focal consolidation. No pulmonary edema. No pleural effusion. No pneumothorax. No acute osseous abnormality. IMPRESSION: 1. No active cardiopulmonary disease. 2.  Aortic Atherosclerosis (ICD10-I70.0). Electronically Signed   By: Morgane  Naveau M.D.   On: 12/29/2023 19:38    EGD 11/'24:  Impression: - Esophageal mucosal changes secondary to established short- segment Barrett' s disease. Biopsied. - Erythematous mucosa in the antrum. Biopsied. - Acquired duodenal stenosis. Biopsied.  Path:  FINAL MICROSCOPIC DIAGNOSIS:   A. STOMACH, ANTRUM, BIOPSY:  - Gastric antral mucosa with nonspecific reactive gastropathy and focal  intestinal metaplasia  - Negative for dysplasia  - Helicobacter pylori-like organisms are not identified on routine HE  stain   B. ESOPHAGUS, DISTAL, BIOPSY:  - Esophageal squamous and cardiac mucosa with reactive changes  - Negative for intestinal metaplasia or dysplasia   C. DUODENUM, BIOPSY:  - Duodenal mucosa with no specific histopathologic changes  - Negative for increased intraepithelial lymphocytes or villous  architectural changes    EKG:  Personally reviewed, sinus rhythm, ST depression inferiorly  ED Course:  -Due to concern for non-STEMI in the setting of recent RCA stent the patient was initially started on heparin  drip in the emergency department. -EP consulted with cardiology Dr. Duffy who evaluated the patient and noted a recent acute drop in hemoglobin, and due to concern for GI bleeding heparin  was stopped. -  Please see my prior note, in short patient hemoglobin dropping in the ED and developing worsening tachycardia suggestive of developing hemorrhagic shock.  Ordered for 2 unit of emergency release blood.  -Consulted gastroenterology and spoke with Dr. Burnette and GI has spoken with cardiology Dr. Duffy re: need to continue DAPT in setting of recent stent.  Tentative plan for CTA study to evaluate for bleeding vessel, IR intervention if identified.  Possible diagnostic endoscopy in the morning.  And for now remain n.p.o., resuscitate with blood products, PPI IV -I had requested PCCM evaluation, Emilio PA, Dr. Dub they have reviewed the chart and at this point do not feel requires ICU level of care and recommending for stepdown.    Assessment/Plan:  68 y.o. female with hx CAD with persistent anginal symptoms who underwent recent LHC and PCI with RCA DES on 9/10 and started on DAPT; additional history including CVA, hypertension, hyperlipidemia, diabetes, hypothyroidism, OSA, COPD and GERD; who presents with persistent epigastric pain, suspect related to PUD, with finding of upper GI bleeding and acute blood loss anemia, developing hemorrhagic shock.  And otherwise with acute myocardial injury which is suspected demand in the setting of acute bleed.   Upper GI bleeding Developing hemorrhagic shock Acute blood loss anemia Epigastric pain, suspect PUD History likely prior GI bleeding Barrett's esophagus, duodenal stricture 11/'24  Acute epigastric pain since Saturday 9/13, vomiting yesterday with likely old blood and clots.  Positive melena on ED exam.  On DAPT due to recent stent.  No other anticoagulants.  Denies NSAIDs (previous goody powder before recent initiation of DAPT). Hb 12.7 on 9/11 ->  9.4 on arrival -> 8.3 (3 hr between check). Worsening tachycardia into 120s c/w developing shock. CTA negative for bleeding vessel.  -- Ordered for 2 unit emergency release blood, prepare additional 2 unit if  needed -- GI consultation, I have spoken with Dr. Burnette, tentative plan for resuscitation with blood products, PPI IV, possible diagnostic endoscopy in a.m. - I had requested PCCM evaluation, Emilio PA, Dr. Dub they have reviewed the chart and at this point do not feel requires ICU level of care and recommending for stepdown.  -Keep n.p.o. swab for comfort -Pantoprazole  40 mg IV twice daily -Maintain 2 large-bore IV -Will need to continue antiplatelets due to recent stent, discussed with cardiology, see below   Acute myocardial injury S/p RCA DES on 9/10, requiring uninterrupted DAPT Suspect pain more likely related to peptic ulcer disease than cardiac in origin.  High-sensitivity troponin elevated 156 -> 185.  EKG with ST depression inferiorly. - Cardiology consultation, has been seen by Dr. Duffy, recommendation to continue uninterrupted dual antiplatelet therapy with supportive care for GI bleeding and potential intervention by GI per above.  -Ordered for Plavix  75 mg daily-> Dr. Duffy has spoken with interventionalist and they were ok stopping aspirin  temporarily --Continue home Bisoprolol , hold if worsening BP  -Continue home zetia , also on repatha  outpatient.   Chronic medical problems: CVA: See CAD above  Hypertension: Hold home Losartan , Hydralazine , ISMN I/s/o bleed and low normal pressures. Will continue Bisoprolol   Hyperlipidemia: See CAD  Diabetes type 2: Check A1c. On Ozempic OP. SSI for very sensitive  Hypothyroidism: Continue home levothyroxine  OSA: Will verify if on CPAP COPD: Nebs prn Chronic pain: Continue home tramadol , Lyrica   Body mass index is 31.35 kg/m.  Obesity class I, would benefit from weight loss outpatient  DVT prophylaxis:  SCDs Code Status:  DNR with Intubation; confirmed with the patient Diet:  Diet Orders (From admission, onward)     Start     Ordered   12/29/23 2343  Diet NPO time specified  Diet effective now        12/29/23 2342            Family Communication:  None   Consults:  Cardiology, GI   Admission status:   Inpatient, Step Down Unit  Severity of Illness: The appropriate patient status for this patient is INPATIENT. Inpatient status is judged to be reasonable and necessary in order to provide the required intensity of service to ensure the patient's safety. The patient's presenting symptoms, physical exam findings, and initial radiographic and laboratory data in the context of their chronic comorbidities is felt to place them at high risk for further clinical deterioration. Furthermore, it is not anticipated that the patient will be medically stable for discharge from the hospital within 2 midnights of admission.   * I certify that at the point of admission it is my clinical judgment that the patient will require inpatient hospital care spanning beyond 2 midnights from the point of admission due to high intensity of service, high risk for further deterioration and high frequency of surveillance required.*   Dorn Dawson, MD [facility]  How to contact the [facility] Attending or Consulting provider 7A - 7P or covering provider during after hours 7P -7A, for this patient.  Check the care team in [facility] and look for a) attending/consulting [facility] provider listed and b) the [facility] team listed Log into [email or web address] and use [place]'s universal password to access. If you do not have the password, please contact the hospital operator. Locate the [facility] provider you are looking for under [facility] and page to a number that you can be directly reached. If you still have difficulty reaching the provider, please page the  DOC (Director on Call) for the Hospitalists listed on amion for assistance.  12/30/2023, 1:14 AM

## 2023-12-30 NOTE — ED Notes (Signed)
 Pt reporting nausea and gas. Provider messaged, states not appropriate for antiemetic at this time d/t QTC. EKG obtained. Pt remains on continuous cardiac monitor. Awaiting further orders.

## 2023-12-30 NOTE — ED Notes (Addendum)
 Provider states to recheck BP in 30-60 minutes and if closer to 100 then ok to give lasix 

## 2023-12-30 NOTE — ED Notes (Signed)
 Pt ambulatory to BR w/ standby assist. Returned to stretcher, placed back on cardiac monitoring, VSS, call bell in reach. Pt stating she is going to try to get some sleep. Lights dimmed for comfort, linens changed.

## 2023-12-30 NOTE — ED Notes (Signed)
 Pt sitting up in bed reporting SOB. Pt remains 96-100% on 2 L via [place]. LS clear bilaterally. Provider messaged, pt requesting nebulizer or inhaler at this time, states she has COPD. Awaiting further orders.

## 2023-12-30 NOTE — ED Notes (Signed)
 Roommate Odis Blackwood [phone] would like an update asap

## 2023-12-31 ENCOUNTER — Encounter (HOSPITAL_COMMUNITY)
Admission: EM | Disposition: A | Discharge status: Home / Self Care | Payer: Self-pay | Source: Home / Self Care | Attending: Internal Medicine

## 2023-12-31 ENCOUNTER — Encounter (HOSPITAL_COMMUNITY): Payer: Self-pay | Admitting: Internal Medicine

## 2023-12-31 ENCOUNTER — Inpatient Hospital Stay (HOSPITAL_COMMUNITY): Admitting: Anesthesiology

## 2023-12-31 DIAGNOSIS — K449 Diaphragmatic hernia without obstruction or gangrene: Secondary | ICD-10-CM

## 2023-12-31 DIAGNOSIS — Z87891 Personal history of nicotine dependence: Secondary | ICD-10-CM | POA: Diagnosis not present

## 2023-12-31 DIAGNOSIS — I2510 Atherosclerotic heart disease of native coronary artery without angina pectoris: Secondary | ICD-10-CM | POA: Diagnosis not present

## 2023-12-31 DIAGNOSIS — I2489 Other forms of acute ischemic heart disease: Secondary | ICD-10-CM | POA: Diagnosis not present

## 2023-12-31 DIAGNOSIS — I10 Essential (primary) hypertension: Secondary | ICD-10-CM | POA: Diagnosis not present

## 2023-12-31 DIAGNOSIS — K922 Gastrointestinal hemorrhage, unspecified: Secondary | ICD-10-CM | POA: Diagnosis not present

## 2023-12-31 HISTORY — PX: ESOPHAGOGASTRODUODENOSCOPY: SHX5428

## 2023-12-31 LAB — BASIC METABOLIC PANEL WITH GFR
Anion gap: 13 (ref 5–15)
BUN: 52 mg/dL — ABNORMAL HIGH (ref 8–23)
CO2: 21 mmol/L — ABNORMAL LOW (ref 22–32)
Calcium: 8.3 mg/dL — ABNORMAL LOW (ref 8.9–10.3)
Chloride: 106 mmol/L (ref 98–111)
Creatinine, Ser: 0.93 mg/dL (ref 0.44–1.00)
GFR, Estimated: >60 mL/min (ref >60)
Glucose, Bld: 162 mg/dL — ABNORMAL HIGH (ref 70–99)
Potassium: 4 mmol/L (ref 3.5–5.1)
Sodium: 140 mmol/L (ref 135–145)

## 2023-12-31 LAB — CBC WITH DIFFERENTIAL/PLATELET
Abs Immature Granulocytes: 0.07 K/uL (ref 0.00–0.07)
Basophils Absolute: 0 K/uL (ref 0.0–0.1)
Basophils Relative: 0 %
Eosinophils Absolute: 0 K/uL (ref 0.0–0.5)
Eosinophils Relative: 0 %
HCT: 23.2 % — ABNORMAL LOW (ref 36.0–46.0)
Hemoglobin: 7.3 g/dL — ABNORMAL LOW (ref 12.0–15.0)
Immature Granulocytes: 1 %
Lymphocytes Relative: 21 %
Lymphs Abs: 2.1 K/uL (ref 0.7–4.0)
MCH: 30.3 pg (ref 26.0–34.0)
MCHC: 31.5 g/dL (ref 30.0–36.0)
MCV: 96.3 fL (ref 80.0–100.0)
Monocytes Absolute: 0.9 K/uL (ref 0.1–1.0)
Monocytes Relative: 9 %
Neutro Abs: 7 K/uL (ref 1.7–7.7)
Neutrophils Relative %: 69 %
Platelets: 188 K/uL (ref 150–400)
RBC: 2.41 MIL/uL — ABNORMAL LOW (ref 3.87–5.11)
RDW: 15.9 % — ABNORMAL HIGH (ref 11.5–15.5)
WBC: 10.1 K/uL (ref 4.0–10.5)
nRBC: 0 % (ref 0.0–0.2)

## 2023-12-31 LAB — GLUCOSE, CAPILLARY
Glucose-Capillary: 181 mg/dL — ABNORMAL HIGH (ref 70–99)
Glucose-Capillary: 294 mg/dL — ABNORMAL HIGH (ref 70–99)
Glucose-Capillary: 211 mg/dL — ABNORMAL HIGH (ref 70–99)

## 2023-12-31 LAB — CBG MONITORING, ED
Glucose-Capillary: 124 mg/dL — ABNORMAL HIGH (ref 70–99)
Glucose-Capillary: 154 mg/dL — ABNORMAL HIGH (ref 70–99)

## 2023-12-31 LAB — PREPARE RBC (CROSSMATCH)

## 2023-12-31 SURGERY — EGD (ESOPHAGOGASTRODUODENOSCOPY)
Anesthesia: Monitor Anesthesia Care

## 2023-12-31 MED ORDER — PROPOFOL 10 MG/ML IV BOLUS
INTRAVENOUS | Status: DC | PRN
  Administered 2023-12-31: 100 mg via INTRAVENOUS

## 2023-12-31 MED ORDER — ONDANSETRON HCL 4 MG/2ML IJ SOLN
INTRAMUSCULAR | Status: DC | PRN
  Administered 2023-12-31: 4 mg via INTRAVENOUS

## 2023-12-31 MED ORDER — PHENYLEPHRINE 80 MCG/ML (10ML) SYRINGE FOR IV PUSH (FOR BLOOD PRESSURE SUPPORT)
PREFILLED_SYRINGE | INTRAVENOUS | Status: DC | PRN
  Administered 2023-12-31 (×3): 120 ug via INTRAVENOUS

## 2023-12-31 MED ORDER — SODIUM CHLORIDE 0.9% IV SOLUTION: Freq: Once | INTRAVENOUS | Status: DC

## 2023-12-31 MED ORDER — LIDOCAINE 2% (20 MG/ML) 5 ML SYRINGE
INTRAMUSCULAR | Status: DC | PRN
  Administered 2023-12-31: 60 mg via INTRAVENOUS

## 2023-12-31 MED ORDER — FENTANYL CITRATE (PF) 100 MCG/2ML IJ SOLN
INTRAMUSCULAR | Status: AC
  Filled 2023-12-31: qty 2

## 2023-12-31 MED ORDER — DEXAMETHASONE SODIUM PHOSPHATE 10 MG/ML IJ SOLN
INTRAMUSCULAR | Status: DC | PRN
  Administered 2023-12-31: 8 mg via INTRAVENOUS

## 2023-12-31 MED ORDER — SUCCINYLCHOLINE CHLORIDE 200 MG/10ML IV SOSY
PREFILLED_SYRINGE | INTRAVENOUS | Status: DC | PRN
  Administered 2023-12-31: 100 mg via INTRAVENOUS

## 2023-12-31 MED ORDER — SUCRALFATE 1 GM/10ML PO SUSP
1.0000 g | Freq: Three times a day (TID) | ORAL | Status: DC
  Administered 2023-12-31 – 2024-01-03 (×11): 1 g via ORAL
  Filled 2023-12-31 (×11): qty 10

## 2023-12-31 NOTE — Anesthesia Postprocedure Evaluation (Signed)
 Anesthesia Post Note  Patient: Grace [name]  Procedure(s) Performed: EGD (ESOPHAGOGASTRODUODENOSCOPY)     Patient location during evaluation: PACU Anesthesia Type: General Level of consciousness: awake and alert Pain management: pain level controlled Vital Signs Assessment: post-procedure vital signs reviewed and stable Respiratory status: spontaneous breathing, nonlabored ventilation, respiratory function stable and patient connected to nasal cannula oxygen  Cardiovascular status: blood pressure returned to baseline and stable Postop Assessment: no apparent nausea or vomiting Anesthetic complications: no   No notable events documented.  Last Vitals:  Vitals:   12/31/23 1316 12/31/23 1320  BP: 94/60 99/65  Pulse: 96 97  Resp: 18 16  Temp:    SpO2: 96% 96%    Last Pain:  Vitals:   12/31/23 1320  TempSrc:   PainSc: 0-No pain                 Debby FORBES Like

## 2023-12-31 NOTE — Op Note (Signed)
 [facility] Patient Name: Grace [name] Procedure Date : 12/31/2023 MRN: [id number] Attending MD: Oliva Boots , MD, [id number] Date of Birth: [birth year] CSN: [id number] Age: 68 Admit Type: Inpatient Procedure:                Upper GI endoscopy Indications:              Acute post hemorrhagic anemia, Hematemesis, Melena Providers:                Oliva Boots, MD, Willy Hummer, RN, Coye Bade, Technician Referring MD:              Medicines:                General Anesthesia Complications:            No immediate complications. Estimated Blood Loss:     Estimated blood loss: none. Procedure:                Pre-Anesthesia Assessment:                           - Prior to the procedure, a History and Physical                            was performed, and patient medications and                            allergies were reviewed. The patient's tolerance of                            previous anesthesia was also reviewed. The risks                            and benefits of the procedure and the sedation                            options and risks were discussed with the patient.                            All questions were answered, and informed consent                            was obtained. Prior Anticoagulants: The patient has                            taken Plavix  (clopidogrel ), last dose was day of                            procedure. ASA Grade Assessment: III - A patient                            with severe systemic disease. After reviewing the  risks and benefits, the patient was deemed in                            satisfactory condition to undergo the procedure.                           After obtaining informed consent, the endoscope was                            passed under direct vision. Throughout the                            procedure, the patient's blood pressure, pulse, and                             oxygen  saturations were monitored continuously. The                            [id number]-[id number] ([id number]) Olympus endoscope was introduced                            through the mouth, and advanced to the duodenal                            bulb. The upper GI endoscopy was accomplished                            without difficulty. The patient tolerated the                            procedure well. Scope In: Scope Out: Findings:      A small hiatal hernia was present.      There were esophageal mucosal changes consistent with short-segment       Barrett's esophagus present in the lower third of the esophagus.      A large amount of food (residue) and black material was found in the       cardia, in the gastric fundus and in the gastric body. But no active       bleeding and only a minimal amount could be suctioned      One non-bleeding cratered gastric ulcer with no stigmata of bleeding was       found at the pylorus. It could not be made to bleed with washing and       watching      Food (residue) and old blood was found in the duodenal bulb. 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 Impression:               - Small hiatal hernia.                           -  Esophageal mucosal changes consistent with                            short-segment Barrett's esophagus.                           - A large amount of food (residue) in the stomach.                           - Non-bleeding gastric ulcer with no stigmata of                            bleeding.                           - Retained food in the duodenum.                           - The examination was otherwise normal.                           - No specimens collected. Recommendation:           - Clear liquid  diet today.                           - Continue present medications. Will add Carafate                             and could consider even adding Cytotec  in the future                           - Return to GI clinic PRN. Will check on tomorrow                            and might consider repeat endoscopy after a few                            days of clear liquids only to make sure no other at                            risk lesions                           - Telephone GI clinic if symptomatic PRN. Procedure Code(s):        --- Professional ---                           [phone], Esophagogastroduodenoscopy, flexible,                            transoral; diagnostic, including collection of                            specimen(s) by brushing or washing, when performed                            (  separate procedure) Diagnosis Code(s):        --- Professional ---                           K44.9, Diaphragmatic hernia without obstruction or                            gangrene                           K22.89, Other specified disease of esophagus                           K25.9, Gastric ulcer, unspecified as acute or                            chronic, without hemorrhage or perforation                           D62, Acute posthemorrhagic anemia                           K92.0, Hematemesis                           K92.1, Melena (includes Hematochezia) CPT copyright 2022 American Medical Association. All rights reserved. The codes documented in this report are preliminary and upon coder review may  be revised to meet current compliance requirements. Oliva Boots, MD 12/31/2023 12:56:37 PM This report has been signed electronically. Number of Addenda: 0

## 2023-12-31 NOTE — ED Notes (Signed)
 CCMD called to report transfer to [id number].

## 2023-12-31 NOTE — Progress Notes (Signed)
 PROGRESS NOTE    Grace [name]  FMW:[id number] DOB: 1955/06/14 DOA: 12/29/2023 PCP: Delores Rojelio Caldron, NP    Brief Narrative:  68/F with CAD, CVA, type 2 diabetes mellitus, hypothyroidism, COPD, OSA, GERD, recently underwent PCI with DES to RCA on 9/10, started on DAPT, discharged home 9/11 developed weakness, upper abdominal discomfort, vomiting with black material, also noted some shortness of breath and chest discomfort. - On admission significant drop in hemoglobin from baseline trended down to 8.3 from 12,  3 days ago.    Assessment and Plan: Acute blood loss anemia, upper GI bleed -Transfused 2 units of PRBC overnight -History of prior upper GI bleed with Barrett's esophagus and duodenal stricture 11/24 - Getting a total of 4 units PRBCs thus far -Continue IV PPI, keep n.p.o. - Status post EGD - Monitor CBC   CAD -Recent RCA DES on 9/10 -Now with upper GI bleed and acute blood loss anemia -Cards following, continue bisoprolol , Plavix , aspirin  being held temporarily--Will need Plavix  resume when able   Hypertension: Hold home Losartan , Hydralazine , ISMN I/s/o bleed and low normal pressures. Will continue Bisoprolol   Hyperlipidemia: See CAD  Diabetes type 2: SSI Hypothyroidism: Continue home levothyroxine   COPD: Nebs prn Chronic pain: Continue home tramadol , Lyrica   Obesity Estimated body mass index is 31.35 kg/m as calculated from the following:   Height as of this encounter: 5' 3 (1.6 m).   Weight as of this encounter: 80.3 kg.   DVT prophylaxis:     Code Status: Prior   Disposition Plan:  Level of care: Progressive Status is: Inpatient     Consultants:  Cardiology GI   Subjective: Not feeling well  Objective: Vitals:   12/31/23 1342 12/31/23 1350 12/31/23 1355 12/31/23 1438  BP: 109/71  (!) 99/56 97/67  Pulse: 93  95 93  Resp: 18  15 19   Temp:  99.1 F (37.3 C)    TempSrc:  Oral    SpO2: 96%  96% 96%  Weight:      Height:         Intake/Output Summary (Last 24 hours) at 12/31/2023 1440 Last data filed at 12/31/2023 1300 Gross per 24 hour  Intake 414 ml  Output --  Net 414 ml   Filed Weights   12/29/23 1855  Weight: 80.3 kg    Examination:   General: Appearance:    Obese female in no acute distress     Lungs:     respirations unlabored  Heart:    Normal heart rate. Normal rhythm. No murmurs, rubs, or gallops.    MS:   All extremities are intact.    Neurologic:   Awake, alert       Data Reviewed: I have personally reviewed following labs and imaging studies  CBC: Recent Labs  Lab 12/29/23 1901 12/29/23 1910 12/30/23 0554 12/30/23 0929 12/30/23 1414 12/30/23 2048 12/31/23 0505  WBC 8.9  --   --   --  10.5 11.8* 10.1  NEUTROABS  --   --   --   --   --   --  7.0  HGB 9.4*   < > 10.1* 9.7* 9.9* 8.1* 7.3*  HCT 31.7*   < > 32.5* 30.7* 31.7* 26.1* 23.2*  MCV 99.7  --   --   --  94.9 96.0 96.3  PLT 269  --   --   --  233 221 188   < > = values in this interval not displayed.   Basic Metabolic Panel:  Recent Labs  Lab 12/29/23 1901 12/29/23 1910 12/30/23 1414 12/31/23 0505  NA 140 141 140 140  K 3.5 3.4* 4.2 4.0  CL 102 101 108 106  CO2 26  --  21* 21*  GLUCOSE 150* 153* 169* 162*  BUN 20 21 28* 52*  CREATININE 0.86 0.80 0.76 0.93  CALCIUM  9.0  --  8.5* 8.3*   GFR: Estimated Creatinine Clearance: 58.1 mL/min (by C-G formula based on SCr of 0.93 mg/dL). Liver Function Tests: Recent Labs  Lab 12/29/23 2249 12/30/23 1414  AST 26 24  ALT 18 18  ALKPHOS 51 47  BILITOT 0.4 0.5  PROT 5.1* 5.1*  ALBUMIN 2.6* 2.5*   Recent Labs  Lab 12/29/23 2249  LIPASE 34   No results for input(s): AMMONIA in the last 168 hours. Coagulation Profile: Recent Labs  Lab 12/29/23 1901  INR 1.1   Cardiac Enzymes: No results for input(s): CKTOTAL, CKMB, CKMBINDEX, TROPONINI in the last 168 hours. BNP (last 3 results) Recent Labs    02/26/23 1437  PROBNP 234.0*    HbA1C: Recent Labs    12/30/23 0316  HGBA1C 5.7*   CBG: Recent Labs  Lab 12/30/23 1140 12/30/23 1802 12/31/23 0016 12/31/23 0751 12/31/23 1139  GLUCAP 139* 169* 154* 124* 181*   Lipid Profile: No results for input(s): CHOL, HDL, LDLCALC, TRIG, CHOLHDL, LDLDIRECT in the last 72 hours. Thyroid  Function Tests: No results for input(s): TSH, T4TOTAL, FREET4, T3FREE, THYROIDAB in the last 72 hours. Anemia Panel: No results for input(s): VITAMINB12, FOLATE, FERRITIN, TIBC, IRON , RETICCTPCT in the last 72 hours. Sepsis Labs: Recent Labs  Lab 12/29/23 2050 12/30/23 0555  LATICACIDVEN 1.2 0.7    No results found for this or any previous visit (from the past 240 hours).       Radiology Studies: ECHOCARDIOGRAM COMPLETE Result Date: 12/30/2023    ECHOCARDIOGRAM REPORT   Patient Name:   Grace [name] Date of Exam: 12/30/2023 Medical Rec #:  [id number]       Height:       63.0 in Accession #:    [id number]      Weight:       177.0 lb Date of Birth:  [birth year]       BSA:          1.836 m Patient Age:    68 years        BP:           131/77 mmHg Patient Gender: F               HR:           89 bpm. Exam Location:  Inpatient Procedure: 2D Echo (Both Spectral and Color Flow Doppler were utilized during            procedure). Indications:    CP  History:        Patient has prior history of Echocardiogram examinations.                 Signs/Symptoms:Chest Pain; Risk Factors:Diabetes and                 Hypertension.  Sonographer:    Norleen Amour Referring Phys: DARRYLE NED O'NEAL IMPRESSIONS  1. Left ventricular ejection fraction, by estimation, is 55 to 60%. The left ventricle has normal function. The left ventricle has no regional wall motion abnormalities. There is mild concentric left ventricular hypertrophy. Left ventricular diastolic parameters were normal.  2. Right ventricular systolic function  is normal. The right ventricular size is normal. There  is mildly elevated pulmonary artery systolic pressure.  3. The mitral valve is normal in structure. No evidence of mitral valve regurgitation. No evidence of mitral stenosis.  4. The aortic valve is tricuspid. Aortic valve regurgitation is not visualized. No aortic stenosis is present.  5. The inferior vena cava is normal in size with greater than 50% respiratory variability, suggesting right atrial pressure of 3 mmHg. FINDINGS  Left Ventricle: Left ventricular ejection fraction, by estimation, is 55 to 60%. The left ventricle has normal function. The left ventricle has no regional wall motion abnormalities. The left ventricular internal cavity size was normal in size. There is  mild concentric left ventricular hypertrophy. Left ventricular diastolic parameters were normal. Normal left ventricular filling pressure. Right Ventricle: The right ventricular size is normal. No increase in right ventricular wall thickness. Right ventricular systolic function is normal. There is mildly elevated pulmonary artery systolic pressure. The tricuspid regurgitant velocity is 3.00  m/s, and with an assumed right atrial pressure of 3 mmHg, the estimated right ventricular systolic pressure is 39.0 mmHg. Left Atrium: Left atrial size was normal in size. Right Atrium: Right atrial size was normal in size. Pericardium: There is no evidence of pericardial effusion. Mitral Valve: The mitral valve is normal in structure. Mild mitral annular calcification. No evidence of mitral valve regurgitation. No evidence of mitral valve stenosis. Tricuspid Valve: The tricuspid valve is normal in structure. Tricuspid valve regurgitation is mild . No evidence of tricuspid stenosis. Aortic Valve: The aortic valve is tricuspid. Aortic valve regurgitation is not visualized. No aortic stenosis is present. Pulmonic Valve: The pulmonic valve was normal in structure. Pulmonic valve regurgitation is not visualized. No evidence of pulmonic stenosis. Aorta: The  aortic root is normal in size and structure. Venous: The inferior vena cava is normal in size with greater than 50% respiratory variability, suggesting right atrial pressure of 3 mmHg. IAS/Shunts: No atrial level shunt detected by color flow Doppler.  LEFT VENTRICLE PLAX 2D LVIDd:         3.90 cm     Diastology LVIDs:         2.90 cm     LV e' medial:    0.09 cm/s LV PW:         1.10 cm     LV E/e' medial:  8.2 LV IVS:        1.20 cm     LV e' lateral:   0.10 cm/s LVOT diam:     1.70 cm     LV E/e' lateral: 7.0 LV SV:         50 LV SV Index:   27 LVOT Area:     2.27 cm  LV Volumes (MOD) LV vol d, MOD A2C: 59.8 ml LV vol d, MOD A4C: 76.6 ml LV vol s, MOD A2C: 22.1 ml LV vol s, MOD A4C: 36.4 ml LV SV MOD A2C:     37.7 ml LV SV MOD A4C:     76.6 ml LV SV MOD BP:      38.9 ml RIGHT VENTRICLE             IVC RV Basal diam:  2.20 cm     IVC diam: 1.00 cm RV S prime:     12.80 cm/s TAPSE (M-mode): 2.4 cm LEFT ATRIUM           Index        RIGHT ATRIUM  Index LA diam:      3.60 cm 1.96 cm/m   RA Area:     8.72 cm LA Vol (A2C): 61.6 ml 33.55 ml/m  RA Volume:   14.90 ml 8.12 ml/m LA Vol (A4C): 30.8 ml 16.78 ml/m  AORTIC VALVE LVOT Vmax:   99.80 cm/s LVOT Vmean:  63.300 cm/s LVOT VTI:    0.220 m  AORTA Ao Root diam: 2.80 cm Ao Asc diam:  3.00 cm MITRAL VALVE                TRICUSPID VALVE MV Area (PHT): 5.34 cm     TR Peak grad:   36.0 mmHg MV Decel Time: 142 msec     TR Vmax:        300.00 cm/s MV E velocity: 0.72 cm/s MV A velocity: 108.00 cm/s  SHUNTS MV E/A ratio:  0.01         Systemic VTI:  0.22 m                             Systemic Diam: 1.70 cm Annabella Scarce MD Electronically signed by Annabella Scarce MD Signature Date/Time: 12/30/2023/3:12:00 PM    Final    CT ANGIO GI BLEED Result Date: 12/30/2023 CLINICAL DATA:  Lower GI hemorrhage on blood thinners EXAM: CTA ABDOMEN AND PELVIS WITHOUT AND WITH CONTRAST TECHNIQUE: Multidetector CT imaging of the abdomen and pelvis was performed using the  standard protocol during bolus administration of intravenous contrast. Multiplanar reconstructed images and MIPs were obtained and reviewed to evaluate the vascular anatomy. RADIATION DOSE REDUCTION: This exam was performed according to the departmental dose-optimization program which includes automated exposure control, adjustment of the mA and/or kV according to patient size and/or use of iterative reconstruction technique. CONTRAST:  75mL OMNIPAQUE  IOHEXOL  350 MG/ML SOLN COMPARISON:  None Available. FINDINGS: VASCULAR Aorta: Atherosclerotic calcifications are noted without aneurysmal dilatation or dissection. Celiac: Patent without evidence of aneurysm, dissection, vasculitis or significant stenosis. SMA: Patent without evidence of aneurysm, dissection, vasculitis or significant stenosis. Renals: Both renal arteries are patent without evidence of aneurysm, dissection, vasculitis, fibromuscular dysplasia or significant stenosis. IMA: Patent without evidence of aneurysm, dissection, vasculitis or significant stenosis. Inflow: Iliacs demonstrate atherosclerotic calcification without aneurysmal dilatation or dissection. Veins: No specific venous abnormality is noted. Review of the MIP images confirms the above findings. NON-VASCULAR Lower chest: Mild scarring is noted in the bases bilaterally. Hepatobiliary: No focal liver abnormality is seen. Status post cholecystectomy. No biliary dilatation. Pancreas: Unremarkable. No pancreatic ductal dilatation or surrounding inflammatory changes. Spleen: Splenic cyst is noted.  No other focal abnormality is noted. Adrenals/Urinary Tract: Adrenal glands are within normal limits. Kidneys demonstrate renal cystic change bilaterally. No follow-up is recommended. No renal calculi or obstructive changes are seen. The bladder is partially distended. Stomach/Bowel: Diverticular change of the colon is noted without diverticulitis. No obstructive changes are seen in the colon. The  appendix has been surgically removed. Small bowel is within normal limits. Stomach is distended with ingested fluid. No findings to suggest active GI hemorrhage are seen. No pooling of contrast is noted. Lymphatic: No lymphadenopathy is noted. Reproductive: Uterus is well visualized with multiple exophytic fibroids. No discrete adnexal mass is noted. Other: No abdominal wall hernia or abnormality. No abdominopelvic ascites. Musculoskeletal: No acute or significant osseous findings. IMPRESSION: VASCULAR No findings to suggest active GI hemorrhage. Atherosclerotic calcifications. NON-VASCULAR Diverticulosis without diverticulitis. Multiple uterine fibroids. Electronically Signed   By:  Oneil Devonshire M.D.   On: 12/30/2023 00:48   DG Chest 2 View Result Date: 12/29/2023 CLINICAL DATA:  chestpain EXAM: CHEST - 2 VIEW COMPARISON:  Chest x-ray 01/09/2023. FINDINGS: The heart and mediastinal contours are unchanged. Atherosclerotic plaque. No focal consolidation. No pulmonary edema. No pleural effusion. No pneumothorax. No acute osseous abnormality. IMPRESSION: 1. No active cardiopulmonary disease. 2.  Aortic Atherosclerosis (ICD10-I70.0). Electronically Signed   By: Morgane  Naveau M.D.   On: 12/29/2023 19:38        Scheduled Meds:  sodium chloride    Intravenous Once   clopidogrel   75 mg Oral Q breakfast   furosemide   40 mg Intravenous BID   insulin  aspart  0-6 Units Subcutaneous TID WC   pantoprazole  (PROTONIX ) IV  40 mg Intravenous BID   Continuous Infusions:   LOS: 1 day    Time spent: 45 minutes spent on chart review, discussion with nursing staff, consultants, updating family and interview/physical exam; more than 50% of that time was spent in counseling and/or coordination of care.    Harlene RAYMOND Bowl, DO [facility] Available via Epic secure chat 7am-7pm After these hours, please refer to coverage provider listed on [email or web address] 12/31/2023, 2:40 PM

## 2023-12-31 NOTE — ED Notes (Signed)
 Pt alert and oriented. Respirations regular. Pt denies chest pain/shortness of breath at this time.

## 2023-12-31 NOTE — Progress Notes (Signed)
 Grace [name] 10:01 AM  Subjective: Cardiology note appreciated and patient seen and examined and no new complaints but did have a black bowel movement and still does not feel well with both chest and abdominal pain  Objective: Vital signs stable afebrile no acute distress but looks ill abdomen is soft nontender BUN increased significantly creatinine okay hemoglobin dropped from 9.9-8 0.1-7.3  Assessment: Upper GI bleeding and patient on aspirin  and Plavix  and pump inhibitors at home  Plan: Despite her continuing on Plavix  I feel like we need to proceed with an endoscopy to either try to help or know what we are up against and that procedure was again thoroughly discussed with the patient and we will proceed later today with further workup and plans pending those findings  [facility]  office [phone] After 5PM or if no answer call [phone]

## 2023-12-31 NOTE — Anesthesia Preprocedure Evaluation (Addendum)
 Anesthesia Evaluation  Patient identified by MRN, date of birth, ID band Patient awake    Reviewed: Allergy & Precautions, NPO status , Patient's Chart, lab work & pertinent test results  History of Anesthesia Complications Negative for: history of anesthetic complications  Airway Mallampati: II  TM Distance: >3 FB Neck ROM: Full    Dental  (+) Dental Advisory Given, Chipped   Pulmonary asthma , sleep apnea and Continuous Positive Airway Pressure Ventilation , COPD,  COPD inhaler, former smoker   Pulmonary exam normal        Cardiovascular hypertension, Pt. on medications + angina  + CAD and + Cardiac Stents  Normal cardiovascular exam   '25 TTE - EF 55 to 60%. There is mild concentric left ventricular hypertrophy. There is mildly elevated pulmonary artery systolic pressure. No significant valvular d/o identified.  '25 Cath - Prox RCA-1 lesion is 50% stenosed. Prox RCA-2 lesion is 80% stenosed.  Mid RCA lesion is 80% stenosed with 40% stenosed side branch in Acute Mrg.   Scoring balloon angioplasty was performed using a BALLOON SCOREFLEX 4.0X10 at both 80% lesions and into the percent stenosis.   Prox RCA-2 lesion is 80% stenosed.   A drug-eluting stent was successfully placed covering all 3 lesions, using a STENT ONYX FRONTIER 4.0X38 postdilated to 4.2 mm.  Post intervention, there is a 0-15% residual stenosis with TIMI-3 flow maintained however small sidebranch was temporarily occluded and recovered prior to completion of procedure.   Post intervention, there is a 0% residual stenosis.   The left ventricular systolic function is normal. The left ventricular ejection fraction is 55-65% by visual estimate.   LV end diastolic pressure is normal.   There is mild aortic valve stenosis. There is no aortic valve regurgitation.   Continue risk factor GDMT-is on Repatha  for lipids.  Blood pressure grossly well-controlled.    Recommend uninterrupted dual antiplatelet therapy with Aspirin  81mg  daily and Clopidogrel  75mg  daily for a minimum of 6 months (stable ischemic heart disease-Class I recommendation).    Neuro/Psych  Headaches PSYCHIATRIC DISORDERS Anxiety     TIA Neuromuscular disease    GI/Hepatic Neg liver ROS, hiatal hernia, PUD,GERD  Poorly Controlled and Medicated,,  Endo/Other  diabetesHypothyroidism   Obesity On GLP-1a   Renal/GU negative Renal ROS     Musculoskeletal negative musculoskeletal ROS (+)    Abdominal   Peds  Hematology  (+) Blood dyscrasia, anemia  On plavix     Anesthesia Other Findings Chronic back pain HSV  Reproductive/Obstetrics                              Anesthesia Physical Anesthesia Plan  ASA: 4  Anesthesia Plan: General   Post-op Pain Management: Minimal or no pain anticipated   Induction: Intravenous and Rapid sequence  PONV Risk Score and Plan: 3 and Propofol  infusion, Treatment may vary due to age or medical condition and Ondansetron   Airway Management Planned: Oral ETT  Additional Equipment: None  Intra-op Plan:   Post-operative Plan: Extubation in OR  Informed Consent: I have reviewed the patients History and Physical, chart, labs and discussed the procedure including the risks, benefits and alternatives for the proposed anesthesia with the patient or authorized representative who has indicated his/her understanding and acceptance.     Dental advisory given  Plan Discussed with: CRNA and Anesthesiologist  Anesthesia Plan Comments:          Anesthesia Quick Evaluation

## 2023-12-31 NOTE — Anesthesia Procedure Notes (Signed)
 Procedure Name: Intubation Date/Time: 12/31/2023 12:29 PM  Performed by: Delores Duwaine SAUNDERS, CRNAPre-anesthesia Checklist: Patient identified, Emergency Drugs available, Suction available and Patient being monitored Patient Re-evaluated:Patient Re-evaluated prior to induction Oxygen  Delivery Method: Circle System Utilized Preoxygenation: Pre-oxygenation with 100% oxygen  Induction Type: IV induction Ventilation: Mask ventilation without difficulty Laryngoscope Size: Mac and 3 Grade View: Grade I Tube type: Oral Tube size: 7.0 mm Number of attempts: 1 Airway Equipment and Method: Stylet and Oral airway Placement Confirmation: ETT inserted through vocal cords under direct vision, positive ETCO2 and breath sounds checked- equal and bilateral Secured at: 22 cm Tube secured with: Tape Dental Injury: Teeth and Oropharynx as per pre-operative assessment

## 2023-12-31 NOTE — Transfer of Care (Signed)
 Immediate Anesthesia Transfer of Care Note  Patient: Grace [name]  Procedure(s) Performed: EGD (ESOPHAGOGASTRODUODENOSCOPY)  Patient Location: PACU  Anesthesia Type:General  Level of Consciousness: drowsy  Airway & Oxygen  Therapy: Patient Spontanous Breathing  Post-op Assessment: Report given to RN  Post vital signs: Reviewed and stable  Last Vitals:  Vitals Value Taken Time  BP 114/57 12/31/23 12:57  Temp 35.8 C 12/31/23 12:55  Pulse 78 12/31/23 12:56  Resp 24 12/31/23 13:00  SpO2 100 % 12/31/23 12:56  Vitals shown include unfiled device data.  Last Pain:  Vitals:   12/31/23 1255  TempSrc: Temporal  PainSc: Asleep      Patients Stated Pain Goal: 0 (12/31/23 0421)  Complications: No notable events documented.

## 2023-12-31 NOTE — Progress Notes (Signed)
 Cardiology Progress Note  Patient ID: Grace [name] MRN: [id number] DOB: [birth year] Date of Encounter: 12/31/2023 Primary Cardiologist: Alm Clay, MD  Subjective   Chief Complaint: Abdominal pain/Chest pain  HPI: Hemoglobin down to 7.2.  Reports she does not feel well.  Still with chest pain and abdominal pain.  ROS:  All other ROS reviewed and negative. Pertinent positives noted in the HPI.     Telemetry  Overnight telemetry shows sinus tachycardia heart rate 120s, which I personally reviewed.    Physical Exam   Vitals:   12/31/23 0421 12/31/23 0753 12/31/23 0754 12/31/23 0800  BP: (!) 101/51 110/85  106/65  Pulse: (!) 107   (!) 102  Resp: (!) 24 18  17   Temp: 98.1 F (36.7 C)  98.1 F (36.7 C)   TempSrc: Oral  Axillary   SpO2: 99% 100%  100%  Weight:      Height:        Intake/Output Summary (Last 24 hours) at 12/31/2023 0825 Last data filed at 12/30/2023 1055 Gross per 24 hour  Intake 838.33 ml  Output --  Net 838.33 ml       12/29/2023    6:55 PM 12/23/2023    8:39 AM 12/08/2023    9:48 AM  Last 3 Weights  Weight (lbs) 177 lb 180 lb 176 lb 12.8 oz  Weight (kg) 80.287 kg 81.647 kg 80.196 kg    Body mass index is 31.35 kg/m.  General: Well nourished, well developed, in no acute distress Head: Atraumatic, normal size  Eyes: PEERLA, EOMI  Neck: Supple, no JVD Endocrine: No thryomegaly Cardiac: Normal S1, S2; RRR; no murmurs, rubs, or gallops Lungs: Clear to auscultation bilaterally, no wheezing, rhonchi or rales  Abd: Soft, nontender, no hepatomegaly  Ext: No edema, pulses 2+ Musculoskeletal: No deformities, BUE and BLE strength normal and equal Skin: Warm and dry, no rashes   Neuro: Alert and oriented to person, place, time, and situation, CNII-XII grossly intact, no focal deficits  Psych: Normal mood and affect   Cardiac Studies  TTE 12/30/2023  1. Left ventricular ejection fraction, by estimation, is 55 to 60%. The  left ventricle has normal  function. The left ventricle has no regional  wall motion abnormalities. There is mild concentric left ventricular  hypertrophy. Left ventricular diastolic  parameters were normal.   2. Right ventricular systolic function is normal. The right ventricular  size is normal. There is mildly elevated pulmonary artery systolic  pressure.   3. The mitral valve is normal in structure. No evidence of mitral valve  regurgitation. No evidence of mitral stenosis.   4. The aortic valve is tricuspid. Aortic valve regurgitation is not  visualized. No aortic stenosis is present.   5. The inferior vena cava is normal in size with greater than 50%  respiratory variability, suggesting right atrial pressure of 3 mmHg.   Patient Profile  Grace [name] is a 68 y.o. female with CAD status post recent PCI to the mid RCA 12/23/2023, hypertension, diabetes, OSA, COPD admitted on 12/29/2023 with acute GI bleed.  Assessment & Plan   # Epigastric pain # GI bleed # Acute blood loss anemia # Elevated troponin, demand - Hemoglobin is dropped to 7.2.  We will give her 2 units of packed red blood cells today. - Discussed her case with interventional cardiology.  Will pursue Plavix  monotherapy.  She will have to stay on some antiplatelet agent.  Hopefully bleeding will slow down without aspirin . - GI considering  diagnostic endoscopy.  Unfortunately we cannot hold Plavix  completely. - Troponin is elevated but likely related to demand.  Her EKG is reassuring.  Echo was also reassuring. - PPI per hospital medicine. - CT abdomen pelvis is reassuring with no acute hemorrhage.  # CAD status post recent PCI to mid RCA 12/23/2023 - Here with GI bleed.  We will pursue Plavix  monotherapy.  Discussed her case with interventional cardiology.  We will hold aspirin .  We will need to get her through this. - On Repatha  as an outpatient.  # HTN - Holding BP meds in setting of GI bleed.     For questions or updates, please contact  [facility] Please consult [email or web address] for contact info under         Signed, Darryle T. Barbaraann, MD, [facility] [place]  [facility]  12/31/2023 8:25 AM

## 2024-01-01 DIAGNOSIS — I2489 Other forms of acute ischemic heart disease: Secondary | ICD-10-CM | POA: Diagnosis not present

## 2024-01-01 DIAGNOSIS — K922 Gastrointestinal hemorrhage, unspecified: Secondary | ICD-10-CM | POA: Diagnosis not present

## 2024-01-01 LAB — BASIC METABOLIC PANEL WITH GFR
Anion gap: 15 (ref 5–15)
Anion gap: 9 (ref 5–15)
BUN: 29 mg/dL — ABNORMAL HIGH (ref 8–23)
BUN: 25 mg/dL — ABNORMAL HIGH (ref 8–23)
CO2: 22 mmol/L (ref 22–32)
CO2: 24 mmol/L (ref 22–32)
Calcium: 8.2 mg/dL — ABNORMAL LOW (ref 8.9–10.3)
Calcium: 8.2 mg/dL — ABNORMAL LOW (ref 8.9–10.3)
Chloride: 104 mmol/L (ref 98–111)
Chloride: 106 mmol/L (ref 98–111)
Creatinine, Ser: 0.78 mg/dL (ref 0.44–1.00)
Creatinine, Ser: 0.78 mg/dL (ref 0.44–1.00)
GFR, Estimated: >60 mL/min (ref >60)
GFR, Estimated: >60 mL/min (ref >60)
Glucose, Bld: 125 mg/dL — ABNORMAL HIGH (ref 70–99)
Glucose, Bld: 118 mg/dL — ABNORMAL HIGH (ref 70–99)
Potassium: 3.7 mmol/L (ref 3.5–5.1)
Potassium: 3.3 mmol/L — ABNORMAL LOW (ref 3.5–5.1)
Sodium: 141 mmol/L (ref 135–145)
Sodium: 139 mmol/L (ref 135–145)

## 2024-01-01 LAB — TYPE AND SCREEN
ABO/RH(D): O POS
Unit division: 0
Unit division: 0
Unit division: 0
Unit division: 0

## 2024-01-01 LAB — CBC WITH DIFFERENTIAL/PLATELET
Abs Immature Granulocytes: 0.06 K/uL (ref 0.00–0.07)
Basophils Absolute: 0 K/uL (ref 0.0–0.1)
Basophils Relative: 0 %
Eosinophils Absolute: 0 K/uL (ref 0.0–0.5)
Eosinophils Relative: 0 %
HCT: 29.5 % — ABNORMAL LOW (ref 36.0–46.0)
Hemoglobin: 9.8 g/dL — ABNORMAL LOW (ref 12.0–15.0)
Immature Granulocytes: 1 %
Lymphocytes Relative: 25 %
Lymphs Abs: 2.2 K/uL (ref 0.7–4.0)
MCH: 30.4 pg (ref 26.0–34.0)
MCHC: 33.2 g/dL (ref 30.0–36.0)
MCV: 91.6 fL (ref 80.0–100.0)
Monocytes Absolute: 0.6 K/uL (ref 0.1–1.0)
Monocytes Relative: 7 %
Neutro Abs: 5.9 K/uL (ref 1.7–7.7)
Neutrophils Relative %: 67 %
Platelets: 191 K/uL (ref 150–400)
RBC: 3.22 MIL/uL — ABNORMAL LOW (ref 3.87–5.11)
RDW: 15.5 % (ref 11.5–15.5)
WBC: 8.7 K/uL (ref 4.0–10.5)
nRBC: 0 % (ref 0.0–0.2)

## 2024-01-01 LAB — BPAM RBC
Blood Product Expiration Date: 202510042359
Blood Product Expiration Date: 202510152359
Blood Product Expiration Date: 202510152359
Blood Product Unit Number: 202510152359
ISSUE DATE / TIME: 202509162331
ISSUE DATE / TIME: 202509170138
ISSUE DATE / TIME: 202509180920
PRODUCT CODE: 202509181548
PRODUCT CODE: 202510152359
Unit Type and Rh: 202510152359
Unit Type and Rh: 5100
Unit Type and Rh: 5100
Unit Type and Rh: 5100
Unit Type and Rh: 5100
Unit Type and Rh: 5100

## 2024-01-01 LAB — HEMOGLOBIN AND HEMATOCRIT, BLOOD
HCT: 32.3 % — ABNORMAL LOW (ref 36.0–46.0)
HCT: 31.6 % — ABNORMAL LOW (ref 36.0–46.0)
Hemoglobin: 10.5 g/dL — ABNORMAL LOW (ref 12.0–15.0)
Hemoglobin: 10.2 g/dL — ABNORMAL LOW (ref 12.0–15.0)

## 2024-01-01 LAB — GLUCOSE, CAPILLARY
Glucose-Capillary: 111 mg/dL — ABNORMAL HIGH (ref 70–99)
Glucose-Capillary: 91 mg/dL (ref 70–99)
Glucose-Capillary: 150 mg/dL — ABNORMAL HIGH (ref 70–99)
Glucose-Capillary: 155 mg/dL — ABNORMAL HIGH (ref 70–99)
Glucose-Capillary: 143 mg/dL — ABNORMAL HIGH (ref 70–99)

## 2024-01-01 MED ORDER — POTASSIUM CHLORIDE CRYS ER 20 MEQ PO TBCR
20.0000 meq | EXTENDED_RELEASE_TABLET | Freq: Once | ORAL | Status: AC
  Administered 2024-01-01: 20 meq via ORAL
  Filled 2024-01-01: qty 1

## 2024-01-01 MED ORDER — PANTOPRAZOLE SODIUM 40 MG IV SOLR
40.0000 mg | Freq: Four times a day (QID) | INTRAVENOUS | Status: DC
  Administered 2024-01-01 – 2024-01-02 (×5): 40 mg via INTRAVENOUS
  Filled 2024-01-01 (×5): qty 10

## 2024-01-01 MED ORDER — BOOST / RESOURCE BREEZE PO LIQD CUSTOM
1.0000 | Freq: Three times a day (TID) | ORAL | Status: DC
  Administered 2024-01-01 – 2024-01-02 (×2): 1 via ORAL
  Filled 2024-01-01: qty 1

## 2024-01-01 NOTE — Plan of Care (Signed)
  Problem: Coping: Goal: Ability to adjust to condition or change in health will improve Outcome: Progressing   Problem: Health Behavior/Discharge Planning: Goal: Ability to manage health-related needs will improve Outcome: Progressing   Problem: Clinical Measurements: Goal: Diagnostic test results will improve Outcome: Progressing Goal: Respiratory complications will improve Outcome: Progressing   Problem: Activity: Goal: Risk for activity intolerance will decrease Outcome: Progressing   Problem: Coping: Goal: Level of anxiety will decrease Outcome: Progressing   Problem: Pain Managment: Goal: General experience of comfort will improve and/or be controlled Outcome: Progressing

## 2024-01-01 NOTE — Progress Notes (Signed)
 Grace [name] 11:40 AM  Subjective: Patient doing much better and case discussed with her significant other and she has not had a bowel movement today and her pain seems to be better no new complaints no problems from her endoscopy which we rediscussed the results  Objective: Vital signs stable afebrile no acute distress abdomen is soft nontender BUN decreasing nicely hemoglobin increased with transfusion nicely  Assessment: Bleeding from peripyloric ulcer probably Plan: Will allow full liquids today and ask rounding partner to check on tomorrow and she might need a follow-up endoscopy to rule out other significant lesions since she had old food and some old clots in her stomach or if she does well consider a follow-up endoscopy in a few months to document healing  [facility]  office [phone] After 5PM or if no answer call [phone]

## 2024-01-01 NOTE — Progress Notes (Signed)
   01/01/24 1143  Mobility  Activity Ambulated with assistance  Level of Assistance Standby assist, set-up cues, supervision of patient - no hands on  Assistive Device Front wheel walker  Activity Response Tolerated fair  Mobility Referral Yes  Mobility visit 1 Mobility  Mobility Specialist Start Time (ACUTE ONLY) 1143  Mobility Specialist Stop Time (ACUTE ONLY) 1203  Mobility Specialist Time Calculation (min) (ACUTE ONLY) 20 min   Mobility Specialist: Progress Note  Pre-Mobility:      HR 90, SpO2 91% RA During Mobility:             SpO2 - Not good pleth reading. Post-Mobility:    HR 92, SpO2 95% RA  Pt agreeable to mobility session - received in bed. C/o feeling  winded. Returned to chair with all needs met - call bell within reach.   Virgle Boards, BS Mobility Specialist Please contact via SecureChat or  Rehab office at [phone].

## 2024-01-01 NOTE — Plan of Care (Signed)

## 2024-01-01 NOTE — TOC Initial Note (Signed)
 Transition of Care [facility]) - Initial/Assessment Note    Patient Details  Name: Grace [name] MRN: [id number] Date of Birth: 06-28-1955  Transition of [facility]) CM/SW Contact:    Nola Devere Hands, RN Phone Number: 01/01/2024, 2:14 PM  Clinical Narrative:                 Patient is 68 yr old female from home with c/o of weakness, vomiting. Was recently discharged on 12/24/23 after RCI with DES to RCA. Has hx CAD,CVA, type 2DM. Underwent endoscopy yesterday. No needs identified at this time.  CM will continue to following  Expected Discharge Plan: Home/Self Care Barriers to Discharge: Continued Medical Work up   Patient Goals and CMS Choice            Expected Discharge Plan and Services                                              Prior Living Arrangements/Services                       Activities of Daily Living   ADL Screening (condition at time of admission) Independently performs ADLs?: Yes (appropriate for developmental age) Is the patient deaf or have difficulty hearing?: No Does the patient have difficulty seeing, even when wearing glasses/contacts?: No Does the patient have difficulty concentrating, remembering, or making decisions?: No  Permission Sought/Granted                  Emotional Assessment              Admission diagnosis:  Melena [K92.1] Upper GI bleed [K92.2] Elevated troponin [R79.89] Chest pain, unspecified type [R07.9] Patient Active Problem List   Diagnosis Date Noted   Upper GI bleed 12/30/2023   Demand ischemia (HCC) 12/30/2023   CAD S/P percutaneous coronary angioplasty 12/23/2023   Coronary artery disease of native artery of native heart with stable angina pectoris (HCC) 10/10/2023   Preoperative cardiovascular examination 10/10/2023   Severe anemia 02/28/2023   Bilateral lower extremity edema 02/27/2023   Acute sinusitis 01/09/2023   Statin myopathy 10/15/2022   Multinodular goiter 11/29/2020    Gastroesophageal reflux disease with esophagitis without hemorrhage 06/01/2020   History of peptic ulcer disease 06/01/2020   Duodenal stricture 06/01/2020   Dysphagia 06/01/2020   Abnormal findings on esophagogastroduodenoscopy (EGD) 06/01/2020   Coronary artery calcification seen on CAT scan 03/23/2020   OSA on CPAP 01/04/2020   HSV (herpes simplex virus) infection 11/09/2019   Class 2 severe obesity due to excess calories with serious comorbidity and body mass index (BMI) of 37.0 to 37.9 in adult (HCC) 06/27/2019   Ganglion cyst of tendon sheath of right hand 04/28/2017   COPD, severe (HCC) 05/21/2016   Neuropathy of hand 02/15/2016   Type 2 diabetes mellitus without complication, without long-term current use of insulin  (HCC) 01/13/2015   Insomnia 01/13/2015   Ovarian cyst, right 08/21/2014   Endometrial polyp 08/21/2014   Intramural leiomyoma of uterus 08/21/2014   PFO (patent foramen ovale) 08/17/2014   History of cervical dysplasia 07/31/2014   Fibroid, uterine 07/31/2014   History of cervical polypectomy 07/31/2014   Chronic low back pain 11/13/2011   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 [place] [address] [place] [address] Phone: [phone] Fax: [phone]  [facility] - [facility] [address] [place] [address] Phone: [phone] Fax: [phone]  [facility] [address] [place]  [address] Phone: [phone] Fax: [phone]     Social Drivers of Health (SDOH) Social History: SDOH Screenings   Food Insecurity: No Food Insecurity (12/31/2023)  Housing: Low Risk  (12/31/2023)  Transportation Needs: No Transportation Needs (12/31/2023)  Utilities: Not At Risk (12/31/2023)  Depression (PHQ2-9): Low Risk  (04/17/2022)  Financial Resource Strain: Medium Risk (02/23/2018)  Physical Activity: Inactive (02/23/2018)  Social Connections: Moderately Isolated (12/31/2023)  Stress: Stress Concern Present (02/23/2018)  Tobacco Use: Medium Risk (12/31/2023)   SDOH Interventions:     Readmission Risk Interventions     No data to display

## 2024-01-01 NOTE — Progress Notes (Addendum)
 Cardiology Progress Note  Patient ID: Grace [name] MRN: [id number] DOB: 1956/01/18 Date of Encounter: 01/01/2024 Primary Cardiologist: Alm Clay, MD  Subjective   Chief Complaint: None.   HPI: EGD with nonbleeding gastric ulcer.  Hemoglobin is stable.  Denies chest pain or trouble breathing.  ROS:  All other ROS reviewed and negative. Pertinent positives noted in the HPI.     Telemetry  Overnight telemetry shows sinus rhythm 80s, which I personally reviewed.    Physical Exam   Vitals:   12/31/23 2322 01/01/24 0540 01/01/24 0700 01/01/24 0750  BP: 106/80 90/70 105/62 111/62  Pulse: 88 83 84 83  Resp: 17 16 19 14   Temp: 97.8 F (36.6 C) 97.9 F (36.6 C) 98.1 F (36.7 C) 98.2 F (36.8 C)  TempSrc: Oral Oral Oral Oral  SpO2: 95% 94% 95% 93%  Weight:      Height:        Intake/Output Summary (Last 24 hours) at 01/01/2024 0932 Last data filed at 12/31/2023 1300 Gross per 24 hour  Intake 414 ml  Output --  Net 414 ml       12/29/2023    6:55 PM 12/23/2023    8:39 AM 12/08/2023    9:48 AM  Last 3 Weights  Weight (lbs) 177 lb 180 lb 176 lb 12.8 oz  Weight (kg) 80.287 kg 81.647 kg 80.196 kg    Body mass index is 31.35 kg/m.  General: Well nourished, well developed, in no acute distress Head: Atraumatic, normal size  Eyes: PEERLA, EOMI  Neck: Supple, no JVD Endocrine: No thryomegaly Cardiac: Normal S1, S2; RRR; no murmurs, rubs, or gallops Lungs: Clear to auscultation bilaterally, no wheezing, rhonchi or rales  Abd: Soft, nontender, no hepatomegaly  Ext: No edema, pulses 2+ Musculoskeletal: No deformities, BUE and BLE strength normal and equal Skin: Warm and dry, no rashes   Neuro: Alert and oriented to person, place, time, and situation, CNII-XII grossly intact, no focal deficits  Psych: Normal mood and affect   Cardiac Studies  TTE 12/30/2023  1. Left ventricular ejection fraction, by estimation, is 55 to 60%. The  left ventricle has normal function. The  left ventricle has no regional  wall motion abnormalities. There is mild concentric left ventricular  hypertrophy. Left ventricular diastolic  parameters were normal.   2. Right ventricular systolic function is normal. The right ventricular  size is normal. There is mildly elevated pulmonary artery systolic  pressure.   3. The mitral valve is normal in structure. No evidence of mitral valve  regurgitation. No evidence of mitral stenosis.   4. The aortic valve is tricuspid. Aortic valve regurgitation is not  visualized. No aortic stenosis is present.   5. The inferior vena cava is normal in size with greater than 50%  respiratory variability, suggesting right atrial pressure of 3 mmHg.   Patient Profile  Grace [name] is a 68 y.o. female with CAD status post PCI to the mid RCA 19 2025, hypertension, diabetes, OSA, COPD who was admitted on 12/29/2023 with acute GI bleed.  EGD on 12/31/2023 with nonbleeding gastric ulcer.  Assessment & Plan   # Acute GI bleed # Acute blood loss anemia - Recent PCI last week to the mid RCA.  She does have a history of GI bleeding and was sent home on Protonix .  She returns with GI bleed.  EGD yesterday reassuring. - Not on bleeding gastric ulcer was noted.  She also had Barrett's esophagus. - Hemoglobin is stable.  For now we will just continue with Plavix  monotherapy.  She was on Protonix  at home and had a GI bleed on DAPT.  She was continued on aspirin  and Plavix  here and had further bleeding.  It seems of slowed without aspirin .  She seems to be doing well on Plavix .  I did discuss her case with interventional cardiology and they would recommend to continue with Plavix  monotherapy.  Could consider rechallenging aspirin  in several weeks but for now I think things are safe with Plavix  monotherapy.  # CAD status post PCI to the mid RCA 12/23/2023 # Elevated troponin, demand - Admitted with acute blood loss anemia secondary to GI bleed.  Unfortunately had recent  PCI.  Plan is to continue Plavix  monotherapy.  See discussion above.  Can consider rechallenge with aspirin  but for now would just continue with Plavix . - Her troponin elevation is minimal.  This is secondary to GI bleed.  No concern for acute coronary syndrome. - Continue Repatha  as an outpatient.  # Hypertension - Home BP meds held in the setting of GI bleed.  Restart as you are able.  For questions or updates, please contact [facility] Please consult [email or web address] for contact info under     Signed, Darryle T. Barbaraann, MD, [facility] [place]  [facility]  01/01/2024 9:32 AM

## 2024-01-01 NOTE — Progress Notes (Signed)
 PROGRESS NOTE    Grace [name]  FMW:[id number] DOB: 31-Dec-1955 DOA: 12/29/2023 PCP: Delores Rojelio Caldron, NP    Brief Narrative:  68/F with CAD, CVA, type 2 diabetes mellitus, hypothyroidism, COPD, OSA, GERD, recently underwent PCI with DES to RCA on 9/10, started on DAPT, discharged home 9/11 developed weakness, upper abdominal discomfort, vomiting with black material, also noted some shortness of breath and chest discomfort. - On admission significant drop in hemoglobin from baseline trended down to 8.3 from 12,  3 days ago.    Assessment and Plan:  Acute blood loss anemia  upper GI bleed Bleeding from peripyloric ulcer probably  - She has total of 4 units -History of prior upper GI bleed with Barrett's esophagus and duodenal stricture 11/24, repeat endoscopy 9/18 significant for prepyloric ulcer with no stigmata of bleeding, but evidence of blood, and significant amount of residual food in the stomach . -Continue with IV Protonix . -Continue with clear liquid diet for now, as may need repeat endoscopy to evaluate for any other lesions. - CBC closely and transfuse, target hemoglobin around 10 in the setting of recent stent and MI   CAD -Recent RCA DES on 9/10 -Now with upper GI bleed and acute blood loss anemia -Cards following, continue bisoprolol , Plavix , for now aspirin  can be held per cardiology     Hypertension:  - Blood pressure is soft, continue to hold home Losartan , Hydralazine , ISMN I/s/o bleed and low normal pressures. Will continue Bisoprolol   Hyperlipidemia: See CAD   Diabetes type 2: SSI  Hypothyroidism: Continue home levothyroxine   COPD: Nebs prn  Chronic pain: Continue home tramadol , Lyrica   Obesity Estimated body mass index is 31.35 kg/m as calculated from the following:   Height as of this encounter: 5' 3 (1.6 m).   Weight as of this encounter: 80.3 kg.   DVT prophylaxis:     Code Status: Prior   Disposition Plan:  Level of care:  Progressive Status is: Inpatient     Consultants:  Cardiology GI   Subjective: Reports she is feeling better today, reports low intensity chest discomfort this is much better than when she came to the hospital, reports 1 BM yesterday dark in color, but none today.  Reports dizziness.  Objective: Vitals:   01/01/24 0700 01/01/24 0750 01/01/24 1148 01/01/24 1203  BP: 105/62 111/62 116/70 120/68  Pulse: 84 83 89 88  Resp: 19 14 (!) 30 (!) 21  Temp: 98.1 F (36.7 C) 98.2 F (36.8 C) 97.7 F (36.5 C) 98.5 F (36.9 C)  TempSrc: Oral Oral Oral Oral  SpO2: 95% 93% 92% 94%  Weight:      Height:       No intake or output data in the 24 hours ending 01/01/24 1328  Filed Weights   12/29/23 1855  Weight: 80.3 kg    Examination:   Awake Alert, Oriented X 3, No new F.N deficits, Normal affect Symmetrical Chest wall movement, Good air movement bilaterally, CTAB RRR,No Gallops,Rubs or new Murmurs, No Parasternal Heave +ve B.Sounds, Abd Soft, No tenderness, No rebound - guarding or rigidity. No Cyanosis, Clubbing or edema, No new Rash or bruise       Data Reviewed: I have personally reviewed following labs and imaging studies  CBC: Recent Labs  Lab 12/29/23 1901 12/29/23 1910 12/30/23 0929 12/30/23 1414 12/30/23 2048 12/31/23 0505 01/01/24 0223  WBC 8.9  --   --  10.5 11.8* 10.1 8.7  NEUTROABS  --   --   --   --   --  7.0 5.9  HGB 9.4*   < > 9.7* 9.9* 8.1* 7.3* 9.8*  HCT 31.7*   < > 30.7* 31.7* 26.1* 23.2* 29.5*  MCV 99.7  --   --  94.9 96.0 96.3 91.6  PLT 269  --   --  233 221 188 191   < > = values in this interval not displayed.   Basic Metabolic Panel: Recent Labs  Lab 12/29/23 1901 12/29/23 1910 12/30/23 1414 12/31/23 0505 01/01/24 0223 01/01/24 0700  NA 140 141 140 140 141 139  K 3.5 3.4* 4.2 4.0 3.7 3.3*  CL 102 101 108 106 104 106  CO2 26  --  21* 21* 22 24  GLUCOSE 150* 153* 169* 162* 125* 118*  BUN 20 21 28* 52* 29* 25*  CREATININE 0.86 0.80  0.76 0.93 0.78 0.78  CALCIUM  9.0  --  8.5* 8.3* 8.2* 8.2*   GFR: Estimated Creatinine Clearance: 67.6 mL/min (by C-G formula based on SCr of 0.78 mg/dL). Liver Function Tests: Recent Labs  Lab 12/29/23 2249 12/30/23 1414  AST 26 24  ALT 18 18  ALKPHOS 51 47  BILITOT 0.4 0.5  PROT 5.1* 5.1*  ALBUMIN 2.6* 2.5*   Recent Labs  Lab 12/29/23 2249  LIPASE 34   No results for input(s): AMMONIA in the last 168 hours. Coagulation Profile: Recent Labs  Lab 12/29/23 1901  INR 1.1   Cardiac Enzymes: No results for input(s): CKTOTAL, CKMB, CKMBINDEX, TROPONINI in the last 168 hours. BNP (last 3 results) Recent Labs    02/26/23 1437  PROBNP 234.0*   HbA1C: Recent Labs    12/30/23 0316  HGBA1C 5.7*   CBG: Recent Labs  Lab 12/31/23 1626 12/31/23 2008 01/01/24 0533 01/01/24 0749 01/01/24 1147  GLUCAP 294* 211* 111* 91 150*   Lipid Profile: No results for input(s): CHOL, HDL, LDLCALC, TRIG, CHOLHDL, LDLDIRECT in the last 72 hours. Thyroid  Function Tests: No results for input(s): TSH, T4TOTAL, FREET4, T3FREE, THYROIDAB in the last 72 hours. Anemia Panel: No results for input(s): VITAMINB12, FOLATE, FERRITIN, TIBC, IRON , RETICCTPCT in the last 72 hours. Sepsis Labs: Recent Labs  Lab 12/29/23 2050 12/30/23 0555  LATICACIDVEN 1.2 0.7    No results found for this or any previous visit (from the past 240 hours).       Radiology Studies: No results found.       Scheduled Meds:  sodium chloride    Intravenous Once   clopidogrel   75 mg Oral Q breakfast   feeding supplement  1 Container Oral TID BM   insulin  aspart  0-6 Units Subcutaneous TID WC   pantoprazole  (PROTONIX ) IV  40 mg Intravenous Q6H   sucralfate   1 g Oral TID WC & HS   Continuous Infusions:   LOS: 2 days      Brayton Lye, MD [facility] Available via Epic secure chat 7am-7pm After these hours, please refer to coverage provider  listed on [email or web address] 01/01/2024, 1:28 PM

## 2024-01-02 ENCOUNTER — Other Ambulatory Visit: Payer: Self-pay | Admitting: Nurse Practitioner

## 2024-01-02 DIAGNOSIS — I25118 Atherosclerotic heart disease of native coronary artery with other forms of angina pectoris: Secondary | ICD-10-CM

## 2024-01-02 DIAGNOSIS — D62 Acute posthemorrhagic anemia: Secondary | ICD-10-CM

## 2024-01-02 DIAGNOSIS — K2270 Barrett's esophagus without dysplasia: Secondary | ICD-10-CM

## 2024-01-02 DIAGNOSIS — K254 Chronic or unspecified gastric ulcer with hemorrhage: Secondary | ICD-10-CM

## 2024-01-02 LAB — CBC
HCT: 30.4 % — ABNORMAL LOW (ref 36.0–46.0)
Hemoglobin: 10 g/dL — ABNORMAL LOW (ref 12.0–15.0)
MCH: 30.3 pg (ref 26.0–34.0)
MCHC: 32.9 g/dL (ref 30.0–36.0)
MCV: 92.1 fL (ref 80.0–100.0)
Platelets: 189 K/uL (ref 150–400)
RBC: 3.3 MIL/uL — ABNORMAL LOW (ref 3.87–5.11)
RDW: 15.6 % — ABNORMAL HIGH (ref 11.5–15.5)
WBC: 7 K/uL (ref 4.0–10.5)
nRBC: 0 % (ref 0.0–0.2)

## 2024-01-02 LAB — GLUCOSE, CAPILLARY
Glucose-Capillary: 108 mg/dL — ABNORMAL HIGH (ref 70–99)
Glucose-Capillary: 165 mg/dL — ABNORMAL HIGH (ref 70–99)
Glucose-Capillary: 118 mg/dL — ABNORMAL HIGH (ref 70–99)
Glucose-Capillary: 133 mg/dL — ABNORMAL HIGH (ref 70–99)

## 2024-01-02 LAB — BASIC METABOLIC PANEL WITH GFR
Anion gap: 10 (ref 5–15)
BUN: 14 mg/dL (ref 8–23)
CO2: 24 mmol/L (ref 22–32)
Calcium: 8.5 mg/dL — ABNORMAL LOW (ref 8.9–10.3)
Chloride: 104 mmol/L (ref 98–111)
Creatinine, Ser: 0.88 mg/dL (ref 0.44–1.00)
GFR, Estimated: >60 mL/min (ref >60)
Glucose, Bld: 118 mg/dL — ABNORMAL HIGH (ref 70–99)
Potassium: 3.5 mmol/L (ref 3.5–5.1)
Sodium: 138 mmol/L (ref 135–145)

## 2024-01-02 MED ORDER — BISOPROLOL FUMARATE 10 MG PO TABS
10.0000 mg | ORAL_TABLET | Freq: Every day | ORAL | Status: DC
Start: 2024-01-02 — End: 2024-01-03
  Administered 2024-01-02: 10 mg via ORAL
  Filled 2024-01-02 (×2): qty 1

## 2024-01-02 MED ORDER — POLYETHYLENE GLYCOL 3350 17 G PO PACK
34.0000 g | PACK | Freq: Two times a day (BID) | ORAL | Status: DC
  Administered 2024-01-03: 17 g via ORAL
  Filled 2024-01-02 (×2): qty 2

## 2024-01-02 MED ORDER — PREGABALIN 25 MG PO CAPS
50.0000 mg | ORAL_CAPSULE | Freq: Every day | ORAL | Status: DC
  Administered 2024-01-02 – 2024-01-03 (×2): 50 mg via ORAL
  Filled 2024-01-02 (×2): qty 2

## 2024-01-02 MED ORDER — INFLUENZA VAC SPLIT HIGH-DOSE 0.5 ML IM SUSY
0.5000 mL | PREFILLED_SYRINGE | INTRAMUSCULAR | Status: AC
  Administered 2024-01-03: 0.5 mL via INTRAMUSCULAR
  Filled 2024-01-02: qty 0.5

## 2024-01-02 MED ORDER — TRAMADOL HCL 50 MG PO TABS
50.0000 mg | ORAL_TABLET | Freq: Four times a day (QID) | ORAL | Status: DC | PRN
Start: 2024-01-02 — End: 2024-01-03
  Administered 2024-01-02: 50 mg via ORAL
  Filled 2024-01-02: qty 1

## 2024-01-02 MED ORDER — POTASSIUM CHLORIDE CRYS ER 20 MEQ PO TBCR
40.0000 meq | EXTENDED_RELEASE_TABLET | Freq: Once | ORAL | Status: AC
  Administered 2024-01-02: 40 meq via ORAL
  Filled 2024-01-02: qty 2

## 2024-01-02 MED ORDER — PANTOPRAZOLE SODIUM 40 MG IV SOLR
40.0000 mg | Freq: Two times a day (BID) | INTRAVENOUS | Status: DC
  Administered 2024-01-02 – 2024-01-03 (×2): 40 mg via INTRAVENOUS
  Filled 2024-01-02 (×2): qty 10

## 2024-01-02 MED ORDER — DOCUSATE SODIUM 100 MG PO CAPS
200.0000 mg | ORAL_CAPSULE | Freq: Once | ORAL | Status: DC
  Filled 2024-01-02: qty 2

## 2024-01-02 NOTE — Plan of Care (Signed)
  Problem: Coping: Goal: Ability to adjust to condition or change in health will improve Outcome: Progressing   Problem: Clinical Measurements: Goal: Will remain free from infection Outcome: Progressing Goal: Diagnostic test results will improve Outcome: Progressing   Problem: Activity: Goal: Risk for activity intolerance will decrease Outcome: Progressing   Problem: Coping: Goal: Level of anxiety will decrease Outcome: Progressing   Problem: Pain Managment: Goal: General experience of comfort will improve and/or be controlled Outcome: Progressing

## 2024-01-02 NOTE — Progress Notes (Addendum)
  Progress Note  Patient Name: Grace [name] Date of Encounter: 01/02/2024 [facility] Cardiologist: Alm Clay, MD   Interval Summary   No nausea, vomiting or other signs of bleeding.  Hemoglobin has been stable at around 10.0.  Has occasional chest discomfort but it sounds different from her presenting angina previous admission.  Vital Signs Vitals:   01/01/24 2338 01/02/24 0422 01/02/24 0756 01/02/24 1205  BP: 127/73 (!) 110/59 119/66 96/74  Pulse: 90 83 83 81  Resp: 15 15 16 17   Temp: 97.6 F (36.4 C) 98 F (36.7 C) 97.7 F (36.5 C) 97.7 F (36.5 C)  TempSrc: Oral Oral Oral Oral  SpO2: 92% 97% 92% 94%  Weight:      Height:       No intake or output data in the 24 hours ending 01/02/24 1254    12/29/2023    6:55 PM 12/23/2023    8:39 AM 12/08/2023    9:48 AM  Last 3 Weights  Weight (lbs) 177 lb 180 lb 176 lb 12.8 oz  Weight (kg) 80.287 kg 81.647 kg 80.196 kg      Telemetry/ECG  Sinus rhythm with frequent but brief bursts of paroxysmal atrial tachycardia, never sustained- Personally Reviewed  Physical Exam  GEN: No acute distress.   Neck: No JVD Cardiac: RRR, no murmurs, rubs, or gallops.  Respiratory: Clear to auscultation bilaterally. GI: Soft, nontender, non-distended  MS: No edema  Assessment & Plan  68 year old woman with CAD and recent DES-RCA (12/23/2023), DM2, treated hypothyroidism, COPD, OSA, GERD, presenting with hematemesis and anemia, requiring transfusion of 4 units PRBC.  EGD shows nonbleeding gastric ulcer and Barrett's esophagus, but the study was challenging due to residual food in all blood.  Plan to treat with clopidogrel  monotherapy going forward.  Blood pressure medications have all been discontinued.  Blood pressure is completely normal today.  Resume beta-blocker to deal with both the blood pressure and the bursts of atrial tachycardia as well as for antianginal value.  For the time being there is no urgent reason to restart  hydralazine , losartan  or isosorbide .   For questions or updates, please contact [facility] Please consult [email or web address] for contact info under         Signed, Jerel Balding, MD

## 2024-01-02 NOTE — Plan of Care (Signed)
  Problem: Coping: Goal: Ability to adjust to condition or change in health will improve Outcome: Progressing   Problem: Fluid Volume: Goal: Ability to maintain a balanced intake and output will improve Outcome: Progressing   Problem: Nutritional: Goal: Maintenance of adequate nutrition will improve Outcome: Progressing Goal: Progress toward achieving an optimal weight will improve Outcome: Progressing   Problem: Skin Integrity: Goal: Risk for impaired skin integrity will decrease Outcome: Progressing   Problem: Education: Goal: Knowledge of General Education information will improve Description: Including pain rating scale, medication(s)/side effects and non-pharmacologic comfort measures Outcome: Progressing

## 2024-01-02 NOTE — Progress Notes (Signed)
 PROGRESS NOTE    Grace [name]  FMW:[id number] DOB: 1955-12-10 DOA: 12/29/2023 PCP: Grace Rojelio Caldron, NP    Brief Narrative:  68/F with CAD, CVA, type 2 diabetes mellitus, hypothyroidism, COPD, OSA, GERD, recently underwent PCI with DES to RCA on 9/10, started on DAPT, discharged home 9/11 developed weakness, upper abdominal discomfort, vomiting with black material, also noted some shortness of breath and chest discomfort. - On admission significant drop in hemoglobin from baseline trended down to 8.3 from 12,  3 days ago.    Assessment and Plan:  Acute blood loss anemia  upper GI bleed Bleeding from peripyloric ulcer probably  - She has total of 4 units -History of prior upper GI bleed with Barrett's esophagus and duodenal stricture 11/24, repeat endoscopy 9/18 significant for prepyloric ulcer with no stigmata of bleeding, but evidence of blood, and significant amount of residual food in the stomach . -Continue with IV Protonix . - Hgb remained stable, no BM over last 24 hours . - GI input greatly appreciated, diet advanced, recommendation for outpatient follow-up. - She reports no BM, concerned about that, will give bowel regimen. - Okay to resume aspirin  1 week per GI of no evidence of recurrent GI bleed    CAD -Recent RCA DES on 9/10 -Now with upper GI bleed and acute blood loss anemia -Cards following, continue bisoprolol , Plavix , for now aspirin  can be held per cardiology     Hypertension:  - Blood pressure is soft, home Losartan , Hydralazine  held on admission. - On bisoprolol  10 mg oral today and if she tolerates can increase to 15 tomorrow, will place TED hose  Hyperlipidemia: See CAD   Diabetes type 2: SSI  Hypothyroidism: Continue home levothyroxine   COPD: Nebs prn  Chronic pain: Continue home tramadol , Lyrica   Obesity class1 Estimated body mass index is 31.35 kg/m as calculated from the following:   Height as of this encounter: 5' 3 (1.6 m).   Weight  as of this encounter: 80.3 kg.   DVT prophylaxis: Place TED hose Start: 01/02/24 9177 Place and maintain sequential compression device Start: 01/01/24 1335    Code Status: Prior   Disposition Plan:  Level of care: Progressive Status is: Inpatient     Consultants:  Cardiology GI   Subjective: No BM yesterday, she is concerned about her constipation, but overall reports she is feeling better  Objective: Vitals:   01/01/24 1959 01/01/24 2338 01/02/24 0422 01/02/24 0756  BP: 122/70 127/73 (!) 110/59 119/66  Pulse: 88 90 83 83  Resp: 20 15 15 16   Temp: 97.6 F (36.4 C) 97.6 F (36.4 C) 98 F (36.7 C) 97.7 F (36.5 C)  TempSrc: Oral Oral Oral Oral  SpO2: 93% 92% 97% 92%  Weight:      Height:       No intake or output data in the 24 hours ending 01/02/24 1120  Filed Weights   12/29/23 1855  Weight: 80.3 kg    Examination:   Awake Alert, Oriented X 3, No new F.N deficits, Normal affect Symmetrical Chest wall movement, Good air movement bilaterally, CTAB RRR,No Gallops,Rubs or new Murmurs, No Parasternal Heave +ve B.Sounds, Abd Soft, No tenderness, No rebound - guarding or rigidity. No Cyanosis, Clubbing or edema, No new Rash or bruise     Data Reviewed: I have personally reviewed following labs and imaging studies  CBC: Recent Labs  Lab 12/30/23 1414 12/30/23 2048 12/31/23 0505 01/01/24 0223 01/01/24 1401 01/01/24 2150 01/02/24 0659  WBC 10.5 11.8* 10.1  8.7  --   --  7.0  NEUTROABS  --   --  7.0 5.9  --   --   --   HGB 9.9* 8.1* 7.3* 9.8* 10.5* 10.2* 10.0*  HCT 31.7* 26.1* 23.2* 29.5* 32.3* 31.6* 30.4*  MCV 94.9 96.0 96.3 91.6  --   --  92.1  PLT 233 221 188 191  --   --  189   Basic Metabolic Panel: Recent Labs  Lab 12/30/23 1414 12/31/23 0505 01/01/24 0223 01/01/24 0700 01/02/24 0659  NA 140 140 141 139 138  K 4.2 4.0 3.7 3.3* 3.5  CL 108 106 104 106 104  CO2 21* 21* 22 24 24   GLUCOSE 169* 162* 125* 118* 118*  BUN 28* 52* 29* 25* 14   CREATININE 0.76 0.93 0.78 0.78 0.88  CALCIUM  8.5* 8.3* 8.2* 8.2* 8.5*   GFR: Estimated Creatinine Clearance: 61.4 mL/min (by C-G formula based on SCr of 0.88 mg/dL). Liver Function Tests: Recent Labs  Lab 12/29/23 2249 12/30/23 1414  AST 26 24  ALT 18 18  ALKPHOS 51 47  BILITOT 0.4 0.5  PROT 5.1* 5.1*  ALBUMIN 2.6* 2.5*   Recent Labs  Lab 12/29/23 2249  LIPASE 34   No results for input(s): AMMONIA in the last 168 hours. Coagulation Profile: Recent Labs  Lab 12/29/23 1901  INR 1.1   Cardiac Enzymes: No results for input(s): CKTOTAL, CKMB, CKMBINDEX, TROPONINI in the last 168 hours. BNP (last 3 results) Recent Labs    02/26/23 1437  PROBNP 234.0*   HbA1C: No results for input(s): HGBA1C in the last 72 hours.  CBG: Recent Labs  Lab 01/01/24 0749 01/01/24 1147 01/01/24 1620 01/01/24 2120 01/02/24 0756  GLUCAP 91 150* 155* 143* 108*   Lipid Profile: No results for input(s): CHOL, HDL, LDLCALC, TRIG, CHOLHDL, LDLDIRECT in the last 72 hours. Thyroid  Function Tests: No results for input(s): TSH, T4TOTAL, FREET4, T3FREE, THYROIDAB in the last 72 hours. Anemia Panel: No results for input(s): VITAMINB12, FOLATE, FERRITIN, TIBC, IRON , RETICCTPCT in the last 72 hours. Sepsis Labs: Recent Labs  Lab 12/29/23 2050 12/30/23 0555  LATICACIDVEN 1.2 0.7    No results found for this or any previous visit (from the past 240 hours).       Radiology Studies: No results found.       Scheduled Meds:  sodium chloride    Intravenous Once   bisoprolol   10 mg Oral Daily   clopidogrel   75 mg Oral Q breakfast   docusate sodium   200 mg Oral Once   feeding supplement  1 Container Oral TID BM   [START ON 01/03/2024] Influenza vac split trivalent PF  0.5 mL Intramuscular Tomorrow-1000   insulin  aspart  0-6 Units Subcutaneous TID WC   pantoprazole  (PROTONIX ) IV  40 mg Intravenous Q6H   polyethylene glycol  34 g Oral BID    sucralfate   1 g Oral TID WC & HS   Continuous Infusions:   LOS: 3 days      Brayton Lye, MD [facility] Available via Epic secure chat 7am-7pm After these hours, please refer to coverage provider listed on [email or web address] 01/02/2024, 11:20 AM

## 2024-01-02 NOTE — Progress Notes (Signed)
 Mobility Specialist: Progress Note   01/02/24 1500  Mobility  Activity Ambulated with assistance  Level of Assistance Standby assist, set-up cues, supervision of patient - no hands on  Assistive Device Front wheel walker  Distance Ambulated (ft) 400 ft  Activity Response Tolerated well  Mobility Referral Yes  Mobility visit 1 Mobility  Mobility Specialist Start Time (ACUTE ONLY) 1135  Mobility Specialist Stop Time (ACUTE ONLY) 1148  Mobility Specialist Time Calculation (min) (ACUTE ONLY) 13 min    Pt received in bed, agreeable to mobility session. SV throughout. SpO2 desat to mid 80s 3x during ambulation, but pt recovered quickly with standing rest break to SpO2 91-93% on RA. C.o feeling SOB by end of session. Returned to room. Left in bed with all needs met, call bell in reach.   Ileana Lute Mobility Specialist Please contact via SecureChat or Rehab office at [phone]

## 2024-01-02 NOTE — Progress Notes (Signed)
 [facility] Gastroenterology Progress Note  Grace [name] 68 y.o. 06-18-1955  CC: GI bleed   Subjective: Patient seen and examined at bedside.  Denies any further bleeding.  No bowel movement yesterday or today.  ROS : Afebrile, negative for chest pain   Objective: Vital signs in last 24 hours: Vitals:   01/02/24 0422 01/02/24 0756  BP: (!) 110/59 119/66  Pulse: 83 83  Resp: 15 16  Temp: 98 F (36.7 C) 97.7 F (36.5 C)  SpO2: 97% 92%    Physical Exam: Well-developed, resting comfortably.  Not in acute distress.  Abdominal exam benign.  Mood and affect normal.  Alert and oriented x 3  Lab Results: Recent Labs    01/01/24 0700 01/02/24 0659  NA 139 138  K 3.3* 3.5  CL 106 104  CO2 24 24  GLUCOSE 118* 118*  BUN 25* 14  CREATININE 0.78 0.88  CALCIUM  8.2* 8.5*   Recent Labs    12/30/23 1414  AST 24  ALT 18  ALKPHOS 47  BILITOT 0.5  PROT 5.1*  ALBUMIN 2.5*   Recent Labs    12/31/23 0505 01/01/24 0223 01/01/24 1401 01/01/24 2150 01/02/24 0659  WBC 10.1 8.7  --   --  7.0  NEUTROABS 7.0 5.9  --   --   --   HGB 7.3* 9.8*   < > 10.2* 10.0*  HCT 23.2* 29.5*   < > 31.6* 30.4*  MCV 96.3 91.6  --   --  92.1  PLT 188 191  --   --  189   < > = values in this interval not displayed.   No results for input(s): LABPROT, INR in the last 72 hours.    Assessment/Plan: -GI bleed.  EGD September 18 showed pyloric ulcer as well as possible short segment Barrett's and blood clots in the stomach. -Acute blood loss anemia - coronary disease status post PCI on December 23, 2023.  Was initially on aspirin  and Plavix .  Currently only on Plavix .  Recommendations ------------------------- - Hemoglobin is stable.  No further bleeding.  Recommend to continue Protonix  40 mg twice a day for 2 months followed by Protonix  40 mg once a day for additional 4 weeks or until repeat EGD is done to document healing of her ulcer. - Recommend follow-up in GI clinic in 2 months to  determine timing of repeat EGD which may have to be done in hospital set up because of her recent coronary artery disease requiring PCI. -If needed, okay to resume aspirin  81 mg after 1 week. - Advance diet as tolerated.  Avoid NSAIDs.  GI will sign off.  Call us  back if needed.   Layla Lah MD, FACP 01/02/2024, 9:40 AM  Contact #  [phone]

## 2024-01-03 ENCOUNTER — Other Ambulatory Visit (HOSPITAL_COMMUNITY): Payer: Self-pay

## 2024-01-03 LAB — BASIC METABOLIC PANEL WITH GFR
Anion gap: 7 (ref 5–15)
BUN: 12 mg/dL (ref 8–23)
CO2: 25 mmol/L (ref 22–32)
Calcium: 8.5 mg/dL — ABNORMAL LOW (ref 8.9–10.3)
Chloride: 103 mmol/L (ref 98–111)
Creatinine, Ser: 0.87 mg/dL (ref 0.44–1.00)
GFR, Estimated: >60 mL/min (ref >60)
Glucose, Bld: 132 mg/dL — ABNORMAL HIGH (ref 70–99)
Potassium: 3.6 mmol/L (ref 3.5–5.1)
Sodium: 135 mmol/L (ref 135–145)

## 2024-01-03 LAB — CBC
HCT: 27.1 % — ABNORMAL LOW (ref 36.0–46.0)
HCT: 33.1 % — ABNORMAL LOW (ref 36.0–46.0)
Hemoglobin: 9 g/dL — ABNORMAL LOW (ref 12.0–15.0)
Hemoglobin: 10.7 g/dL — ABNORMAL LOW (ref 12.0–15.0)
MCH: 30.6 pg (ref 26.0–34.0)
MCH: 30.3 pg (ref 26.0–34.0)
MCHC: 33.2 g/dL (ref 30.0–36.0)
MCHC: 32.3 g/dL (ref 30.0–36.0)
MCV: 92.2 fL (ref 80.0–100.0)
MCV: 93.8 fL (ref 80.0–100.0)
Platelets: 195 K/uL (ref 150–400)
Platelets: 262 K/uL (ref 150–400)
RBC: 2.94 MIL/uL — ABNORMAL LOW (ref 3.87–5.11)
RBC: 3.53 MIL/uL — ABNORMAL LOW (ref 3.87–5.11)
RDW: 15.3 % (ref 11.5–15.5)
RDW: 15.5 % (ref 11.5–15.5)
WBC: 7.6 K/uL (ref 4.0–10.5)
WBC: 7.3 K/uL (ref 4.0–10.5)
nRBC: 0 % (ref 0.0–0.2)
nRBC: 0 % (ref 0.0–0.2)

## 2024-01-03 LAB — GLUCOSE, CAPILLARY
Glucose-Capillary: 139 mg/dL — ABNORMAL HIGH (ref 70–99)
Glucose-Capillary: 147 mg/dL — ABNORMAL HIGH (ref 70–99)

## 2024-01-03 MED ORDER — SUCRALFATE 1 G PO TABS
1.0000 g | ORAL_TABLET | Freq: Four times a day (QID) | ORAL | 0 refills | Status: DC
  Filled 2024-01-03 – 2024-01-04 (×2): qty 120, 30d supply, fill #0

## 2024-01-03 MED ORDER — POTASSIUM CHLORIDE CRYS ER 20 MEQ PO TBCR
40.0000 meq | EXTENDED_RELEASE_TABLET | Freq: Once | ORAL | Status: AC
  Administered 2024-01-03: 40 meq via ORAL
  Filled 2024-01-03: qty 2

## 2024-01-03 MED ORDER — BISOPROLOL FUMARATE 5 MG PO TABS
15.0000 mg | ORAL_TABLET | Freq: Every day | ORAL | Status: DC
  Administered 2024-01-03: 15 mg via ORAL
  Filled 2024-01-03 (×2): qty 1

## 2024-01-03 MED ORDER — ROPINIROLE HCL 0.5 MG PO TABS
0.5000 mg | ORAL_TABLET | Freq: Every day | ORAL | 0 refills | Status: AC
  Filled 2024-01-03: qty 30, 30d supply, fill #0

## 2024-01-03 MED ORDER — PANTOPRAZOLE SODIUM 40 MG PO TBEC
40.0000 mg | DELAYED_RELEASE_TABLET | Freq: Two times a day (BID) | ORAL | 0 refills | Status: DC
  Filled 2024-01-03 – 2024-02-19 (×2): qty 60, 30d supply, fill #0

## 2024-01-03 MED ORDER — BUTALBITAL-APAP-CAFFEINE 50-325-40 MG PO TABS
1.0000 | ORAL_TABLET | Freq: Once | ORAL | Status: AC
  Administered 2024-01-03: 1 via ORAL
  Filled 2024-01-03: qty 1

## 2024-01-03 NOTE — Discharge Instructions (Signed)
Follow with Primary MD Willene Hatchet, NP in 7 days   Get CBC, CMP,  checked  by Primary MD next visit.    Activity: As tolerated with Full fall precautions use walker/cane & assistance as needed   Disposition Home    Diet: Heart Healthy .   On your next visit with your primary care physician please Get Medicines reviewed and adjusted.   Please request your Prim.MD to go over all Hospital Tests and Procedure/Radiological results at the follow up, please get all Hospital records sent to your Prim MD by signing hospital release before you go home.   If you experience worsening of your admission symptoms, develop shortness of breath, life threatening emergency, suicidal or homicidal thoughts you must seek medical attention immediately by calling 911 or calling your MD immediately  if symptoms less severe.  You Must read complete instructions/literature along with all the possible adverse reactions/side effects for all the Medicines you take and that have been prescribed to you. Take any new Medicines after you have completely understood and accpet all the possible adverse reactions/side effects.   Do not drive, operating heavy machinery, perform activities at heights, swimming or participation in water activities or provide baby sitting services if your were admitted for syncope or siezures until you have seen by Primary MD or a Neurologist and advised to do so again.  Do not drive when taking Pain medications.    Do not take more than prescribed Pain, Sleep and Anxiety Medications  Special Instructions: If you have smoked or chewed Tobacco  in the last 2 yrs please stop smoking, stop any regular Alcohol  and or any Recreational drug use.  Wear Seat belts while driving.   Please note  You were cared for by a hospitalist during your hospital stay. If you have any questions about your discharge medications or the care you received while you were in the hospital after you are  discharged, you can call the unit and asked to speak with the hospitalist on call if the hospitalist that took care of you is not available. Once you are discharged, your primary care physician will handle any further medical issues. Please note that NO REFILLS for any discharge medications will be authorized once you are discharged, as it is imperative that you return to your primary care physician (or establish a relationship with a primary care physician if you do not have one) for your aftercare needs so that they can reassess your need for medications and monitor your lab values.

## 2024-01-03 NOTE — Progress Notes (Signed)
  Progress Note  Patient Name: Grace [name] Date of Encounter: 01/03/2024 [facility] Cardiologist: Alm Clay, MD   Interval Summary   [facility] in hallway with mild dyspnea, no chest pain.  Has not had any melena or hematemesis. Hemoglobin dropped from 10.0-9.0.  Vital Signs Vitals:   01/02/24 2010 01/03/24 0018 01/03/24 0459 01/03/24 0818  BP: 108/69 121/61 125/70 118/76  Pulse: 85 79 76 81  Resp: 18 18 18    Temp: 98.7 F (37.1 C) 98.5 F (36.9 C) 98.2 F (36.8 C) 97.6 F (36.4 C)  TempSrc: Oral Oral Oral Oral  SpO2:    97%  Weight:      Height:        Intake/Output Summary (Last 24 hours) at 01/03/2024 1048 Last data filed at 01/02/2024 1345 Gross per 24 hour  Intake --  Output 1 ml  Net -1 ml      12/29/2023    6:55 PM 12/23/2023    8:39 AM 12/08/2023    9:48 AM  Last 3 Weights  Weight (lbs) 177 lb 180 lb 176 lb 12.8 oz  Weight (kg) 80.287 kg 81.647 kg 80.196 kg      Telemetry/ECG  Normal sinus rhythm, very rare and brief bursts of paroxysmal atrial tachycardia- Personally Reviewed  Physical Exam  GEN: No acute distress.   Neck: No JVD Cardiac: RRR, no murmurs, rubs, or gallops.  Respiratory: Clear to auscultation bilaterally. GI: Soft, nontender, non-distended  MS: No edema  Assessment & Plan  68 year old woman with CAD and recent DES-RCA (12/23/2023), DM2, treated hypothyroidism, COPD, OSA, GERD, presenting with hematemesis and anemia, requiring transfusion of 4 units PRBC. EGD shows nonbleeding gastric ulcer and Barrett's esophagus, but the study was challenging due to residual food in all blood.  Slight drop in hemoglobin today, but no overt externalize bleeding. Plan to treat with clopidogrel  monotherapy going forward. BP remains in fully normal range.  No plans to adjust medications any further during this hospitalization. Plan for discharge later today if there is no additional drop in hemoglobin level.  [facility] will sign  off.   The patient is ready for discharge today from a cardiac standpoint. Medication Recommendations:   Clopidogrel  75 mg daily Bisoprolol  15 mg daily Stop aspirin  Reevaluate need for treatment with hydralazine , isosorbide  mononitrate and losartan  at follow-up appointment. Other recommendations (labs, testing, etc): Follow-up CBC per primary team Follow up as an outpatient: With Damien Braver, NP 01/21/2024  For questions or updates, please contact [facility] Please consult [email or web address] for contact info under         Signed, Jerel Balding, MD

## 2024-01-03 NOTE — Plan of Care (Signed)
  Problem: Coping: Goal: Ability to adjust to condition or change in health will improve Outcome: Progressing   Problem: Clinical Measurements: Goal: Will remain free from infection Outcome: Progressing Goal: Diagnostic test results will improve Outcome: Progressing

## 2024-01-03 NOTE — H&P (Signed)
 Physician Discharge Summary  Grace [name] FMW:[id number] DOB: Jul 02, 1955 DOA: 12/29/2023  PCP: Delores Rojelio Caldron, NP  Admit date: 12/29/2023 Discharge date: 01/03/2024  Admitted From: (Home) Disposition:  (Home )  Recommendations for Outpatient Follow-up:  Follow up with PCP in 1-2 weeks Please obtain BMP/CBC in one week  Diet recommendation: Heart Healthy  Brief/Interim Summary:  68/F with CAD, CVA, type 2 diabetes mellitus, hypothyroidism, COPD, OSA, GERD, recently underwent PCI with DES to RCA on 9/10, started on DAPT, discharged home 9/11 developed weakness, upper abdominal discomfort, vomiting with black material, also noted some shortness of breath and chest discomfort. - On admission significant drop in hemoglobin from baseline trended down to 8.3 from 12, just 72 hours prior to this admission, she was noted to have significant melena, she was seen by GI, went for upper endoscopy, please see discussion below.  Acute blood loss anemia  upper GI bleed Bleeding from peripyloric ulcer probably  - She has total of 4 units -History of prior upper GI bleed with Barrett's esophagus and duodenal stricture 11/24, repeat endoscopy 9/18 significant for prepyloric ulcer with no stigmata of bleeding, but evidence of blood, and significant amount of residual food in the stomach . - She was treated with IV Protonix  during hospital stay, she will be discharged on Protonix  40 mg p.o. twice daily and Carafate .  Her hemoglobin remained stable posttransfusion, as well no evidence of any further clinical GI bleed, had BM today, with no further melena , so plan is to hold off on repeat endoscopy this admission, this can be reassessed as an outpatient. -Continue to hold aspirin  on discharge    CAD -Recent RCA DES on 9/10 -Now with upper GI bleed and acute blood loss anemia -Cards following, continue bisoprolol , Plavix , for now we can stop aspirin , and further recommendation upon follow-up per GI and  cardiology as an outpatient         Hypertension:  - Blood pressure is soft, home Losartan , Hydralazine  and Imdur  held on admission. - Back on her bisoprolol  during hospital stay, tolerating this dose, so she will be discharged on bisoprolol  only and hold hydralazine , isosorbide  and losartan  on discharge.  Hyperlipidemia: Continue with Repatha    Diabetes type 2: Continue with home regimen   Hypothyroidism: Continue home levothyroxine    COPD: Nebs prn   Chronic pain: Continue home tramadol , Lyrica    Obesity class1 Estimated body mass index is 31.35 kg/m as calculated from the following:   Height as of this encounter: 5' 3 (1.6 m).   Weight as of this encounter: 80.3 kg.     Discharge Diagnoses:  Principal Problem:   Upper GI bleed Active Problems:   Demand ischemia (HCC)   Gastric ulcer with hemorrhage   Anemia associated with acute blood loss    Discharge Instructions  Discharge Instructions     Diet - low sodium heart healthy   Complete by: As directed    Discharge instructions   Complete by: As directed    Follow with Primary MD Delores Rojelio Caldron, NP in 7 days   Get CBC, CMP, checked  by Primary MD next visit.    Activity: As tolerated with Full fall precautions use walker/cane & assistance as needed   Disposition Home    Diet: Heart Healthy    On your next visit with your primary care physician please Get Medicines reviewed and adjusted.   Please request your Prim.MD to go over all Hospital Tests and Procedure/Radiological results at the follow  up, please get all Hospital records sent to your Prim MD by signing hospital release before you go home.   If you experience worsening of your admission symptoms, develop shortness of breath, life threatening emergency, suicidal or homicidal thoughts you must seek medical attention immediately by calling 911 or calling your MD immediately  if symptoms less severe.  You Must read complete  instructions/literature along with all the possible adverse reactions/side effects for all the Medicines you take and that have been prescribed to you. Take any new Medicines after you have completely understood and accpet all the possible adverse reactions/side effects.   Do not drive, operating heavy machinery, perform activities at heights, swimming or participation in water  activities or provide baby sitting services if your were admitted for syncope or siezures until you have seen by Primary MD or a Neurologist and advised to do so again.  Do not drive when taking Pain medications.    Do not take more than prescribed Pain, Sleep and Anxiety Medications  Special Instructions: If you have smoked or chewed Tobacco  in the last 2 yrs please stop smoking, stop any regular Alcohol  and or any Recreational drug use.  Wear Seat belts while driving.   Please note  You were cared for by a hospitalist during your hospital stay. If you have any questions about your discharge medications or the care you received while you were in the hospital after you are discharged, you can call the unit and asked to speak with the hospitalist on call if the hospitalist that took care of you is not available. Once you are discharged, your primary care physician will handle any further medical issues. Please note that NO REFILLS for any discharge medications will be authorized once you are discharged, as it is imperative that you return to your primary care physician (or establish a relationship with a primary care physician if you do not have one) for your aftercare needs so that they can reassess your need for medications and monitor your lab values.   Increase activity slowly   Complete by: As directed       Allergies as of 01/03/2024       Reactions   Amlodipine     Cramps   Codeine    Headaches    Gabapentin     headache   Prozac [fluoxetine Hcl] Other (See Comments)   Suicidal ideations         Medication List     STOP taking these medications    aspirin  81 MG chewable tablet   hydrALAZINE  50 MG tablet Commonly known as: APRESOLINE    isosorbide  mononitrate 30 MG 24 hr tablet Commonly known as: IMDUR    losartan  100 MG tablet Commonly known as: COZAAR        TAKE these medications    albuterol  108 (90 Base) MCG/ACT inhaler Commonly known as: ProAir  HFA INHALE 2 PUFFS EVERY 6  HOURS AS NEEDED FOR  SHORTNESS OF BREATH OR  WHEEZING   bisoprolol  10 MG tablet Commonly known as: ZEBETA  Take 1 & 1/2  tablets (15 mg total) by mouth daily.   calcium  carbonate 500 MG chewable tablet Commonly known as: TUMS - dosed in mg elemental calcium  Chew 1 tablet by mouth daily as needed for indigestion or heartburn.   clopidogrel  75 MG tablet Commonly known as: PLAVIX  Take 1 tablet (75 mg total) by mouth daily with breakfast.   diphenhydrAMINE  25 MG tablet Commonly known as: BENADRYL  Take 50 mg by mouth at bedtime  as needed for sleep.   docusate sodium  100 MG capsule Commonly known as: COLACE Take 300 mg by mouth daily.   estradiol  0.1 MG/GM vaginal cream Commonly known as: ESTRACE  VAGINAL Place 1 Applicatorful vaginally at bedtime. What changed: when to take this   ezetimibe  10 MG tablet Commonly known as: ZETIA  Take 10 mg by mouth daily.   fluticasone  50 MCG/ACT nasal spray Commonly known as: FLONASE  USE 2 SPRAYS IN BOTH NOSTRILS  DAILY   furosemide  40 MG tablet Commonly known as: LASIX  TAKE 1 TABLET BY MOUTH DAILY AS  NEEDED FOR WEIGHT GAIN OF 3 LB  OVER AT NIGHT OR 5 LB IN 1 WEEK   glucose blood test strip Commonly known as: OneTouch Verio Use to check blood sugar twice daily. Dx: E11.9   ipratropium-albuterol  0.5-2.5 (3) MG/3ML Soln Commonly known as: DUONEB Take 3 mLs by nebulization every 6 (six) hours as needed.   levothyroxine  200 MCG tablet Commonly known as: SYNTHROID  Take 200 mcg by mouth at bedtime.   Nebulizer/Tubing/Mouthpiece Kit 1  application by Does not apply route every 6 (six) hours as needed. FAX TO [phone]   nitroGLYCERIN  0.4 MG SL tablet Commonly known as: NITROSTAT  Place 0.4 mg under the tongue every 5 (five) minutes as needed for chest pain.   OneTouch Delica Plus Lancet33G Misc USE TO CHECK BLOOD SUGAR  TWICE DAILY   Ozempic (0.25 or 0.5 MG/DOSE) 2 MG/3ML Sopn Generic drug: Semaglutide(0.25 or 0.5MG /DOS) Inject 0.5 mg into the skin once a week.   pantoprazole  40 MG tablet Commonly known as: PROTONIX  Take 1 tablet (40 mg total) by mouth 2 (two) times daily before a meal. What changed: when to take this   pregabalin  50 MG capsule Commonly known as: LYRICA  Take 50 mg by mouth daily.   Repatha  SureClick 140 MG/ML Soaj Generic drug: Evolocumab  INJECT 1 PEN SUBCUTANEOUSLY  EVERY 2 WEEKS   rOPINIRole  0.5 MG tablet Commonly known as: REQUIP  Take 1 tablet (0.5 mg total) by mouth at bedtime.   traMADol  50 MG tablet Commonly known as: ULTRAM  Take 50 mg by mouth every 6 (six) hours as needed for moderate pain (pain score 4-6) or severe pain (pain score 7-10).   valACYclovir  500 MG tablet Commonly known as: VALTREX  TAKE 1 TABLET BY MOUTH  DAILY   Vibegron  75 MG Tabs Take 1 tablet (75 mg total) by mouth daily.        Follow-up Information     Gastroenterology, Margarete. Schedule an appointment as soon as possible for a visit in 2 month(s).   Why: Follow-up for gastric ulcer Contact information: [address] [place] [address] [phone]                Allergies  Allergen Reactions   Amlodipine      Cramps    Codeine     Headaches    Gabapentin      headache   Prozac [Fluoxetine Hcl] Other (See Comments)    Suicidal ideations    Consultations: Cardiology Gastroenterology   Procedures/Studies: ECHOCARDIOGRAM COMPLETE Result Date: 12/30/2023    ECHOCARDIOGRAM REPORT   Patient Name:   Grace [name] Date of Exam: 12/30/2023 Medical Rec #:  [id number]        Height:       63.0 in Accession #:    [id number]      Weight:       177.0 lb Date of Birth:  Oct 16, 1955       BSA:  1.836 m Patient Age:    68 years        BP:           131/77 mmHg Patient Gender: F               HR:           89 bpm. Exam Location:  Inpatient Procedure: 2D Echo (Both Spectral and Color Flow Doppler were utilized during            procedure). Indications:    CP  History:        Patient has prior history of Echocardiogram examinations.                 Signs/Symptoms:Chest Pain; Risk Factors:Diabetes and                 Hypertension.  Sonographer:    Norleen Amour Referring Phys: DARRYLE NED O'NEAL IMPRESSIONS  1. Left ventricular ejection fraction, by estimation, is 55 to 60%. The left ventricle has normal function. The left ventricle has no regional wall motion abnormalities. There is mild concentric left ventricular hypertrophy. Left ventricular diastolic parameters were normal.  2. Right ventricular systolic function is normal. The right ventricular size is normal. There is mildly elevated pulmonary artery systolic pressure.  3. The mitral valve is normal in structure. No evidence of mitral valve regurgitation. No evidence of mitral stenosis.  4. The aortic valve is tricuspid. Aortic valve regurgitation is not visualized. No aortic stenosis is present.  5. The inferior vena cava is normal in size with greater than 50% respiratory variability, suggesting right atrial pressure of 3 mmHg. FINDINGS  Left Ventricle: Left ventricular ejection fraction, by estimation, is 55 to 60%. The left ventricle has normal function. The left ventricle has no regional wall motion abnormalities. The left ventricular internal cavity size was normal in size. There is  mild concentric left ventricular hypertrophy. Left ventricular diastolic parameters were normal. Normal left ventricular filling pressure. Right Ventricle: The right ventricular size is normal. No increase in right ventricular wall thickness.  Right ventricular systolic function is normal. There is mildly elevated pulmonary artery systolic pressure. The tricuspid regurgitant velocity is 3.00  m/s, and with an assumed right atrial pressure of 3 mmHg, the estimated right ventricular systolic pressure is 39.0 mmHg. Left Atrium: Left atrial size was normal in size. Right Atrium: Right atrial size was normal in size. Pericardium: There is no evidence of pericardial effusion. Mitral Valve: The mitral valve is normal in structure. Mild mitral annular calcification. No evidence of mitral valve regurgitation. No evidence of mitral valve stenosis. Tricuspid Valve: The tricuspid valve is normal in structure. Tricuspid valve regurgitation is mild . No evidence of tricuspid stenosis. Aortic Valve: The aortic valve is tricuspid. Aortic valve regurgitation is not visualized. No aortic stenosis is present. Pulmonic Valve: The pulmonic valve was normal in structure. Pulmonic valve regurgitation is not visualized. No evidence of pulmonic stenosis. Aorta: The aortic root is normal in size and structure. Venous: The inferior vena cava is normal in size with greater than 50% respiratory variability, suggesting right atrial pressure of 3 mmHg. IAS/Shunts: No atrial level shunt detected by color flow Doppler.  LEFT VENTRICLE PLAX 2D LVIDd:         3.90 cm     Diastology LVIDs:         2.90 cm     LV e' medial:    0.09 cm/s LV PW:  1.10 cm     LV E/e' medial:  8.2 LV IVS:        1.20 cm     LV e' lateral:   0.10 cm/s LVOT diam:     1.70 cm     LV E/e' lateral: 7.0 LV SV:         50 LV SV Index:   27 LVOT Area:     2.27 cm  LV Volumes (MOD) LV vol d, MOD A2C: 59.8 ml LV vol d, MOD A4C: 76.6 ml LV vol s, MOD A2C: 22.1 ml LV vol s, MOD A4C: 36.4 ml LV SV MOD A2C:     37.7 ml LV SV MOD A4C:     76.6 ml LV SV MOD BP:      38.9 ml RIGHT VENTRICLE             IVC RV Basal diam:  2.20 cm     IVC diam: 1.00 cm RV S prime:     12.80 cm/s TAPSE (M-mode): 2.4 cm LEFT ATRIUM            Index        RIGHT ATRIUM          Index LA diam:      3.60 cm 1.96 cm/m   RA Area:     8.72 cm LA Vol (A2C): 61.6 ml 33.55 ml/m  RA Volume:   14.90 ml 8.12 ml/m LA Vol (A4C): 30.8 ml 16.78 ml/m  AORTIC VALVE LVOT Vmax:   99.80 cm/s LVOT Vmean:  63.300 cm/s LVOT VTI:    0.220 m  AORTA Ao Root diam: 2.80 cm Ao Asc diam:  3.00 cm MITRAL VALVE                TRICUSPID VALVE MV Area (PHT): 5.34 cm     TR Peak grad:   36.0 mmHg MV Decel Time: 142 msec     TR Vmax:        300.00 cm/s MV E velocity: 0.72 cm/s MV A velocity: 108.00 cm/s  SHUNTS MV E/A ratio:  0.01         Systemic VTI:  0.22 m                             Systemic Diam: 1.70 cm Annabella Scarce MD Electronically signed by Annabella Scarce MD Signature Date/Time: 12/30/2023/3:12:00 PM    Final    CT ANGIO GI BLEED Result Date: 12/30/2023 CLINICAL DATA:  Lower GI hemorrhage on blood thinners EXAM: CTA ABDOMEN AND PELVIS WITHOUT AND WITH CONTRAST TECHNIQUE: Multidetector CT imaging of the abdomen and pelvis was performed using the standard protocol during bolus administration of intravenous contrast. Multiplanar reconstructed images and MIPs were obtained and reviewed to evaluate the vascular anatomy. RADIATION DOSE REDUCTION: This exam was performed according to the departmental dose-optimization program which includes automated exposure control, adjustment of the mA and/or kV according to patient size and/or use of iterative reconstruction technique. CONTRAST:  75mL OMNIPAQUE  IOHEXOL  350 MG/ML SOLN COMPARISON:  None Available. FINDINGS: VASCULAR Aorta: Atherosclerotic calcifications are noted without aneurysmal dilatation or dissection. Celiac: Patent without evidence of aneurysm, dissection, vasculitis or significant stenosis. SMA: Patent without evidence of aneurysm, dissection, vasculitis or significant stenosis. Renals: Both renal arteries are patent without evidence of aneurysm, dissection, vasculitis, fibromuscular dysplasia or significant  stenosis. IMA: Patent without evidence of aneurysm, dissection, vasculitis or significant stenosis. Inflow: Iliacs demonstrate atherosclerotic  calcification without aneurysmal dilatation or dissection. Veins: No specific venous abnormality is noted. Review of the MIP images confirms the above findings. NON-VASCULAR Lower chest: Mild scarring is noted in the bases bilaterally. Hepatobiliary: No focal liver abnormality is seen. Status post cholecystectomy. No biliary dilatation. Pancreas: Unremarkable. No pancreatic ductal dilatation or surrounding inflammatory changes. Spleen: Splenic cyst is noted.  No other focal abnormality is noted. Adrenals/Urinary Tract: Adrenal glands are within normal limits. Kidneys demonstrate renal cystic change bilaterally. No follow-up is recommended. No renal calculi or obstructive changes are seen. The bladder is partially distended. Stomach/Bowel: Diverticular change of the colon is noted without diverticulitis. No obstructive changes are seen in the colon. The appendix has been surgically removed. Small bowel is within normal limits. Stomach is distended with ingested fluid. No findings to suggest active GI hemorrhage are seen. No pooling of contrast is noted. Lymphatic: No lymphadenopathy is noted. Reproductive: Uterus is well visualized with multiple exophytic fibroids. No discrete adnexal mass is noted. Other: No abdominal wall hernia or abnormality. No abdominopelvic ascites. Musculoskeletal: No acute or significant osseous findings. IMPRESSION: VASCULAR No findings to suggest active GI hemorrhage. Atherosclerotic calcifications. NON-VASCULAR Diverticulosis without diverticulitis. Multiple uterine fibroids. Electronically Signed   By: Oneil Devonshire M.D.   On: 12/30/2023 00:48   DG Chest 2 View Result Date: 12/29/2023 CLINICAL DATA:  chestpain EXAM: CHEST - 2 VIEW COMPARISON:  Chest x-ray 01/09/2023. FINDINGS: The heart and mediastinal contours are unchanged. Atherosclerotic  plaque. No focal consolidation. No pulmonary edema. No pleural effusion. No pneumothorax. No acute osseous abnormality. IMPRESSION: 1. No active cardiopulmonary disease. 2.  Aortic Atherosclerosis (ICD10-I70.0). Electronically Signed   By: Morgane  Naveau M.D.   On: 12/29/2023 19:38   CARDIAC CATHETERIZATION Result Date: 12/23/2023 Table formatting from the original result was not included. Images from the original result were not included.   Prox RCA-1 lesion is 50% stenosed. Prox RCA-2 lesion is 80% stenosed.  Mid RCA lesion is 80% stenosed with 40% stenosed side branch in Acute Mrg.   Scoring balloon angioplasty was performed using a BALLOON SCOREFLEX 4.0X10 at both 80% lesions and into the percent stenosis.   Prox RCA-2 lesion is 80% stenosed.   A drug-eluting stent was successfully placed covering all 3 lesions, using a STENT ONYX FRONTIER 4.0X38 postdilated to 4.2 mm.  Post intervention, there is a 0-15% residual stenosis with TIMI-3 flow maintained however small sidebranch was temporarily occluded and recovered prior to completion of procedure.   Post intervention, there is a 0% residual stenosis.   The left ventricular systolic function is normal. The left ventricular ejection fraction is 55-65% by visual estimate.   LV end diastolic pressure is normal.   There is mild aortic valve stenosis. There is no aortic valve regurgitation.   Continue risk factor GDMT-is on Repatha  for lipids.  Blood pressure grossly well-controlled.   Recommend uninterrupted dual antiplatelet therapy with Aspirin  81mg  daily and Clopidogrel  75mg  daily for a minimum of 6 months (stable ischemic heart disease-Class I recommendation). Diagnostic: Dominance: Right       Intervention    RECOMMENDATIONS   I  Prox RCA-1 lesion is 50% stenosed. Prox RCA-2 lesion is 80% stenosed.  Mid RCA lesion is 80% stenosed with 40% stenosed side branch in Acute Mrg.   Scoring balloon angioplasty was performed using a BALLOON SCOREFLEX 4.0X10 at both  80% lesions and into the percent stenosis.   Prox RCA-2 lesion is 80% stenosed.   A drug-eluting stent was successfully placed covering  all 3 lesions, using a STENT ONYX FRONTIER 4.0X38 postdilated to 4.2 mm.  Post intervention, there is a 0-15% residual stenosis with TIMI-3 flow maintained however small sidebranch was temporarily occluded and recovered prior to completion of procedure.   Post intervention, there is a 0% residual stenosis.   The left ventricular systolic function is normal. The left ventricular ejection fraction is 55-65% by visual estimate.   LV end diastolic pressure is normal.   There is mild aortic valve stenosis. There is no aortic valve regurgitation.   Continue risk factor GDMT-is on Repatha  for lipids.  Blood pressure grossly well-controlled.   Recommend uninterrupted dual antiplatelet therapy with Aspirin  81mg  daily and Clopidogrel  75mg  daily for a minimum of 6 months (stable ischemic heart disease-Class I recommendation).   Alm Clay     Subjective:  She had BM yesterday, no melena, no dizziness, no lightheadedness, eager to go home Discharge Exam: Vitals:   01/03/24 0818 01/03/24 1245  BP: 118/76 121/75  Pulse: 81 82  Resp:    Temp: 97.6 F (36.4 C) 97.6 F (36.4 C)  SpO2: 97% 98%   Vitals:   01/03/24 0018 01/03/24 0459 01/03/24 0818 01/03/24 1245  BP: 121/61 125/70 118/76 121/75  Pulse: 79 76 81 82  Resp: 18 18    Temp: 98.5 F (36.9 C) 98.2 F (36.8 C) 97.6 F (36.4 C) 97.6 F (36.4 C)  TempSrc: Oral Oral Oral Oral  SpO2:   97% 98%  Weight:      Height:        General: Pt is alert, awake, not in acute distress Cardiovascular: RRR, S1/S2 +, no rubs, no gallops Respiratory: CTA bilaterally, no wheezing, no rhonchi Abdominal: Soft, NT, ND, bowel sounds + Extremities: no edema, no cyanosis    The results of significant diagnostics from this hospitalization (including imaging, microbiology, ancillary and laboratory) are listed below for  reference.     Microbiology: No results found for this or any previous visit (from the past 240 hours).   Labs: BNP (last 3 results) No results for input(s): BNP in the last 8760 hours. Basic Metabolic Panel: Recent Labs  Lab 12/31/23 0505 01/01/24 0223 01/01/24 0700 01/02/24 0659 01/03/24 0202  NA 140 141 139 138 135  K 4.0 3.7 3.3* 3.5 3.6  CL 106 104 106 104 103  CO2 21* 22 24 24 25   GLUCOSE 162* 125* 118* 118* 132*  BUN 52* 29* 25* 14 12  CREATININE 0.93 0.78 0.78 0.88 0.87  CALCIUM  8.3* 8.2* 8.2* 8.5* 8.5*   Liver Function Tests: Recent Labs  Lab 12/29/23 2249 12/30/23 1414  AST 26 24  ALT 18 18  ALKPHOS 51 47  BILITOT 0.4 0.5  PROT 5.1* 5.1*  ALBUMIN 2.6* 2.5*   Recent Labs  Lab 12/29/23 2249  LIPASE 34   No results for input(s): AMMONIA in the last 168 hours. CBC: Recent Labs  Lab 12/31/23 0505 01/01/24 0223 01/01/24 1401 01/01/24 2150 01/02/24 0659 01/03/24 0202 01/03/24 1321  WBC 10.1 8.7  --   --  7.0 7.6 7.3  NEUTROABS 7.0 5.9  --   --   --   --   --   HGB 7.3* 9.8* 10.5* 10.2* 10.0* 9.0* 10.7*  HCT 23.2* 29.5* 32.3* 31.6* 30.4* 27.1* 33.1*  MCV 96.3 91.6  --   --  92.1 92.2 93.8  PLT 188 191  --   --  189 195 262   Cardiac Enzymes: No results for input(s): CKTOTAL, CKMB,  CKMBINDEX, TROPONINI in the last 168 hours. BNP: Invalid input(s): POCBNP CBG: Recent Labs  Lab 01/02/24 1205 01/02/24 1556 01/02/24 2110 01/03/24 0816 01/03/24 1243  GLUCAP 165* 118* 133* 139* 147*   D-Dimer No results for input(s): DDIMER in the last 72 hours. Hgb A1c No results for input(s): HGBA1C in the last 72 hours. Lipid Profile No results for input(s): CHOL, HDL, LDLCALC, TRIG, CHOLHDL, LDLDIRECT in the last 72 hours. Thyroid  function studies No results for input(s): TSH, T4TOTAL, T3FREE, THYROIDAB in the last 72 hours.  Invalid input(s): FREET3 Anemia work up No results for input(s): VITAMINB12,  FOLATE, FERRITIN, TIBC, IRON , RETICCTPCT in the last 72 hours. Urinalysis    Component Value Date/Time   COLORURINE YELLOW 08/20/2023 2100   APPEARANCEUR CLEAR 08/20/2023 2100   APPEARANCEUR Cloudy (A) 07/05/2014 1146   LABSPEC 1.017 08/20/2023 2100   PHURINE 5.0 08/20/2023 2100   GLUCOSEU NEGATIVE 08/20/2023 2100   HGBUR NEGATIVE 08/20/2023 2100   BILIRUBINUR NEGATIVE 08/20/2023 2100   BILIRUBINUR Negative 07/24/2023 0854   BILIRUBINUR Negative 07/05/2014 1146   KETONESUR NEGATIVE 08/20/2023 2100   PROTEINUR 30 (A) 08/20/2023 2100   UROBILINOGEN 0.2 07/24/2023 0854   UROBILINOGEN 0.2 10/27/2014 1756   NITRITE NEGATIVE 08/20/2023 2100   LEUKOCYTESUR NEGATIVE 08/20/2023 2100   Sepsis Labs Recent Labs  Lab 01/01/24 0223 01/02/24 0659 01/03/24 0202 01/03/24 1321  WBC 8.7 7.0 7.6 7.3   Microbiology No results found for this or any previous visit (from the past 240 hours).   Time coordinating discharge: Over 30 minutes  SIGNED:   Brayton Lye, MD  [facility] 01/03/2024, 1:53 PM Pager   If 7PM-7AM, please contact [email or web address] [email or web address]

## 2024-01-03 NOTE — Plan of Care (Signed)

## 2024-01-04 ENCOUNTER — Other Ambulatory Visit (HOSPITAL_COMMUNITY): Payer: Self-pay

## 2024-01-04 ENCOUNTER — Encounter (HOSPITAL_COMMUNITY): Payer: Self-pay | Admitting: Gastroenterology

## 2024-01-04 ENCOUNTER — Other Ambulatory Visit: Payer: Self-pay | Admitting: Cardiology

## 2024-01-04 ENCOUNTER — Other Ambulatory Visit: Payer: Self-pay

## 2024-01-04 DIAGNOSIS — I2510 Atherosclerotic heart disease of native coronary artery without angina pectoris: Secondary | ICD-10-CM

## 2024-01-04 DIAGNOSIS — Z789 Other specified health status: Secondary | ICD-10-CM

## 2024-01-04 DIAGNOSIS — Z8673 Personal history of transient ischemic attack (TIA), and cerebral infarction without residual deficits: Secondary | ICD-10-CM

## 2024-01-04 DIAGNOSIS — E785 Hyperlipidemia, unspecified: Secondary | ICD-10-CM

## 2024-01-05 ENCOUNTER — Other Ambulatory Visit: Payer: Self-pay | Admitting: Nurse Practitioner

## 2024-01-05 NOTE — Telephone Encounter (Signed)
 Please verify if pt is using Breztri  or Trelegy so we can update rx. [email or web address]

## 2024-01-05 NOTE — Discharge Summary (Signed)
 Physician Discharge Summary  Grace [name] FMW:[id number] DOB: 11-20-1955 DOA: 12/29/2023  PCP: Delores Rojelio Caldron, NP  Admit date: 12/29/2023 Discharge date: 01/05/2024  Admitted From: (Home) Disposition:  (Home )  Recommendations for Outpatient Follow-up:  Follow up with PCP in 1-2 weeks Please obtain BMP/CBC in one week  Diet recommendation: Heart Healthy  Brief/Interim Summary:  68/F with CAD, CVA, type 2 diabetes mellitus, hypothyroidism, COPD, OSA, GERD, recently underwent PCI with DES to RCA on 9/10, started on DAPT, discharged home 9/11 developed weakness, upper abdominal discomfort, vomiting with black material, also noted some shortness of breath and chest discomfort. - On admission significant drop in hemoglobin from baseline trended down to 8.3 from 12, just 72 hours prior to this admission, she was noted to have significant melena, she was seen by GI, went for upper endoscopy, please see discussion below.  Acute blood loss anemia  upper GI bleed Bleeding from peripyloric ulcer probably  - She has total of 4 units -History of prior upper GI bleed with Barrett's esophagus and duodenal stricture 11/24, repeat endoscopy 9/18 significant for prepyloric ulcer with no stigmata of bleeding, but evidence of blood, and significant amount of residual food in the stomach . - She was treated with IV Protonix  during hospital stay, she will be discharged on Protonix  40 mg p.o. twice daily and Carafate .  Her hemoglobin remained stable posttransfusion, as well no evidence of any further clinical GI bleed, had BM today, with no further melena , so plan is to hold off on repeat endoscopy this admission, this can be reassessed as an outpatient. -Continue to hold aspirin  on discharge    CAD -Recent RCA DES on 9/10 -Now with upper GI bleed and acute blood loss anemia -Cards following, continue bisoprolol , Plavix , for now we can stop aspirin , and further recommendation upon follow-up per GI and  cardiology as an outpatient         Hypertension:  - Blood pressure is soft, home Losartan , Hydralazine  and Imdur  held on admission. - Back on her bisoprolol  during hospital stay, tolerating this dose, so she will be discharged on bisoprolol  only and hold hydralazine , isosorbide  and losartan  on discharge.  Hyperlipidemia: Continue with Repatha    Diabetes type 2: Continue with home regimen   Hypothyroidism: Continue home levothyroxine    COPD: Nebs prn   Chronic pain: Continue home tramadol , Lyrica    Obesity class1 Estimated body mass index is 31.35 kg/m as calculated from the following:   Height as of this encounter: 5' 3 (1.6 m).   Weight as of this encounter: 80.3 kg.     Discharge Diagnoses:  Principal Problem:   Upper GI bleed Active Problems:   Demand ischemia (HCC)   Gastric ulcer with hemorrhage   Anemia associated with acute blood loss    Discharge Instructions  Discharge Instructions     Diet - low sodium heart healthy   Complete by: As directed    Discharge instructions   Complete by: As directed    Follow with Primary MD Delores Rojelio Caldron, NP in 7 days   Get CBC, CMP, checked  by Primary MD next visit.    Activity: As tolerated with Full fall precautions use walker/cane & assistance as needed   Disposition Home    Diet: Heart Healthy    On your next visit with your primary care physician please Get Medicines reviewed and adjusted.   Please request your Prim.MD to go over all Hospital Tests and Procedure/Radiological results at the follow  up, please get all Hospital records sent to your Prim MD by signing hospital release before you go home.   If you experience worsening of your admission symptoms, develop shortness of breath, life threatening emergency, suicidal or homicidal thoughts you must seek medical attention immediately by calling 911 or calling your MD immediately  if symptoms less severe.  You Must read complete  instructions/literature along with all the possible adverse reactions/side effects for all the Medicines you take and that have been prescribed to you. Take any new Medicines after you have completely understood and accpet all the possible adverse reactions/side effects.   Do not drive, operating heavy machinery, perform activities at heights, swimming or participation in water  activities or provide baby sitting services if your were admitted for syncope or siezures until you have seen by Primary MD or a Neurologist and advised to do so again.  Do not drive when taking Pain medications.    Do not take more than prescribed Pain, Sleep and Anxiety Medications  Special Instructions: If you have smoked or chewed Tobacco  in the last 2 yrs please stop smoking, stop any regular Alcohol  and or any Recreational drug use.  Wear Seat belts while driving.   Please note  You were cared for by a hospitalist during your hospital stay. If you have any questions about your discharge medications or the care you received while you were in the hospital after you are discharged, you can call the unit and asked to speak with the hospitalist on call if the hospitalist that took care of you is not available. Once you are discharged, your primary care physician will handle any further medical issues. Please note that NO REFILLS for any discharge medications will be authorized once you are discharged, as it is imperative that you return to your primary care physician (or establish a relationship with a primary care physician if you do not have one) for your aftercare needs so that they can reassess your need for medications and monitor your lab values.   Increase activity slowly   Complete by: As directed       Allergies as of 01/03/2024       Reactions   Amlodipine     Cramps   Codeine    Headaches    Gabapentin     headache   Prozac [fluoxetine Hcl] Other (See Comments)   Suicidal ideations         Medication List     STOP taking these medications    aspirin  81 MG chewable tablet   hydrALAZINE  50 MG tablet Commonly known as: APRESOLINE    isosorbide  mononitrate 30 MG 24 hr tablet Commonly known as: IMDUR    losartan  100 MG tablet Commonly known as: COZAAR        TAKE these medications    albuterol  108 (90 Base) MCG/ACT inhaler Commonly known as: ProAir  HFA INHALE 2 PUFFS EVERY 6  HOURS AS NEEDED FOR  SHORTNESS OF BREATH OR  WHEEZING   bisoprolol  10 MG tablet Commonly known as: ZEBETA  Take 1 & 1/2  tablets (15 mg total) by mouth daily.   calcium  carbonate 500 MG chewable tablet Commonly known as: TUMS - dosed in mg elemental calcium  Chew 1 tablet by mouth daily as needed for indigestion or heartburn.   clopidogrel  75 MG tablet Commonly known as: PLAVIX  Take 1 tablet (75 mg total) by mouth daily with breakfast.   diphenhydrAMINE  25 MG tablet Commonly known as: BENADRYL  Take 50 mg by mouth at bedtime  as needed for sleep.   docusate sodium  100 MG capsule Commonly known as: COLACE Take 300 mg by mouth daily.   estradiol  0.1 MG/GM vaginal cream Commonly known as: ESTRACE  VAGINAL Place 1 Applicatorful vaginally at bedtime. What changed: when to take this   ezetimibe  10 MG tablet Commonly known as: ZETIA  Take 10 mg by mouth daily.   fluticasone  50 MCG/ACT nasal spray Commonly known as: FLONASE  USE 2 SPRAYS IN BOTH NOSTRILS  DAILY   furosemide  40 MG tablet Commonly known as: LASIX  TAKE 1 TABLET BY MOUTH DAILY AS  NEEDED FOR WEIGHT GAIN OF 3 LB  OVER AT NIGHT OR 5 LB IN 1 WEEK   glucose blood test strip Commonly known as: OneTouch Verio Use to check blood sugar twice daily. Dx: E11.9   ipratropium-albuterol  0.5-2.5 (3) MG/3ML Soln Commonly known as: DUONEB Take 3 mLs by nebulization every 6 (six) hours as needed.   levothyroxine  200 MCG tablet Commonly known as: SYNTHROID  Take 200 mcg by mouth at bedtime.   Nebulizer/Tubing/Mouthpiece Kit 1  application by Does not apply route every 6 (six) hours as needed. FAX TO [phone]   nitroGLYCERIN  0.4 MG SL tablet Commonly known as: NITROSTAT  Place 0.4 mg under the tongue every 5 (five) minutes as needed for chest pain.   OneTouch Delica Plus Lancet33G Misc USE TO CHECK BLOOD SUGAR  TWICE DAILY   Ozempic (0.25 or 0.5 MG/DOSE) 2 MG/3ML Sopn Generic drug: Semaglutide(0.25 or 0.5MG /DOS) Inject 0.5 mg into the skin once a week.   pantoprazole  40 MG tablet Commonly known as: PROTONIX  Take 1 tablet (40 mg total) by mouth 2 (two) times daily before a meal. What changed: when to take this   pregabalin  50 MG capsule Commonly known as: LYRICA  Take 50 mg by mouth daily.   rOPINIRole  0.5 MG tablet Commonly known as: REQUIP  Take 1 tablet (0.5 mg total) by mouth at bedtime.   sucralfate  1 g tablet Commonly known as: Carafate  Take 1 tablet (1 g total) by mouth 4 (four) times daily.   traMADol  50 MG tablet Commonly known as: ULTRAM  Take 50 mg by mouth every 6 (six) hours as needed for moderate pain (pain score 4-6) or severe pain (pain score 7-10).   valACYclovir  500 MG tablet Commonly known as: VALTREX  TAKE 1 TABLET BY MOUTH  DAILY   Vibegron  75 MG Tabs Take 1 tablet (75 mg total) by mouth daily.        Follow-up Information     Gastroenterology, Marg[facility]te. Schedule an appointment as soon as possible for a visit in 2 month(s).   Why: Follow-up for gastric ulcer Contact information: [address] [place] [address] [phone]                Allergies  Allergen Reactions   Amlodipine      Cramps    Codeine     Headaches    Gabapentin      headache   Prozac [Fluoxetine Hcl] Other (See Comments)    Suicidal ideations    Consultations: Cardiology Gastroenterology   Procedures/Studies: ECHOCARDIOGRAM COMPLETE Result Date: 12/30/2023    ECHOCARDIOGRAM REPORT   Patient Name:   [facility] Date of Exam: 12/30/2023 Medical Rec #:  [id number]        Height:       63.0 in Accession #:    [id number]      Weight:       177.0 lb Date of Birth:  08-11-1955  BSA:          1.836 m Patient Age:    68 years        BP:           131/77 mmHg Patient Gender: F               HR:           89 bpm. Exam Location:  Inpatient Procedure: 2D Echo (Both Spectral and Color Flow Doppler were utilized during            procedure). Indications:    CP  History:        Patient has prior history of Echocardiogram examinations.                 Signs/Symptoms:Chest Pain; Risk Factors:Diabetes and                 Hypertension.  Sonographer:    Norleen Amour Referring Phys: DARRYLE NED O'NEAL IMPRESSIONS  1. Left ventricular ejection fraction, by estimation, is 55 to 60%. The left ventricle has normal function. The left ventricle has no regional wall motion abnormalities. There is mild concentric left ventricular hypertrophy. Left ventricular diastolic parameters were normal.  2. Right ventricular systolic function is normal. The right ventricular size is normal. There is mildly elevated pulmonary artery systolic pressure.  3. The mitral valve is normal in structure. No evidence of mitral valve regurgitation. No evidence of mitral stenosis.  4. The aortic valve is tricuspid. Aortic valve regurgitation is not visualized. No aortic stenosis is present.  5. The inferior vena cava is normal in size with greater than 50% respiratory variability, suggesting right atrial pressure of 3 mmHg. FINDINGS  Left Ventricle: Left ventricular ejection fraction, by estimation, is 55 to 60%. The left ventricle has normal function. The left ventricle has no regional wall motion abnormalities. The left ventricular internal cavity size was normal in size. There is  mild concentric left ventricular hypertrophy. Left ventricular diastolic parameters were normal. Normal left ventricular filling pressure. Right Ventricle: The right ventricular size is normal. No increase in right ventricular wall  thickness. Right ventricular systolic function is normal. There is mildly elevated pulmonary artery systolic pressure. The tricuspid regurgitant velocity is 3.00  m/s, and with an assumed right atrial pressure of 3 mmHg, the estimated right ventricular systolic pressure is 39.0 mmHg. Left Atrium: Left atrial size was normal in size. Right Atrium: Right atrial size was normal in size. Pericardium: There is no evidence of pericardial effusion. Mitral Valve: The mitral valve is normal in structure. Mild mitral annular calcification. No evidence of mitral valve regurgitation. No evidence of mitral valve stenosis. Tricuspid Valve: The tricuspid valve is normal in structure. Tricuspid valve regurgitation is mild . No evidence of tricuspid stenosis. Aortic Valve: The aortic valve is tricuspid. Aortic valve regurgitation is not visualized. No aortic stenosis is present. Pulmonic Valve: The pulmonic valve was normal in structure. Pulmonic valve regurgitation is not visualized. No evidence of pulmonic stenosis. Aorta: The aortic root is normal in size and structure. Venous: The inferior vena cava is normal in size with greater than 50% respiratory variability, suggesting right atrial pressure of 3 mmHg. IAS/Shunts: No atrial level shunt detected by color flow Doppler.  LEFT VENTRICLE PLAX 2D LVIDd:         3.90 cm     Diastology LVIDs:         2.90 cm     LV e' medial:  0.09 cm/s LV PW:         1.10 cm     LV E/e' medial:  8.2 LV IVS:        1.20 cm     LV e' lateral:   0.10 cm/s LVOT diam:     1.70 cm     LV E/e' lateral: 7.0 LV SV:         50 LV SV Index:   27 LVOT Area:     2.27 cm  LV Volumes (MOD) LV vol d, MOD A2C: 59.8 ml LV vol d, MOD A4C: 76.6 ml LV vol s, MOD A2C: 22.1 ml LV vol s, MOD A4C: 36.4 ml LV SV MOD A2C:     37.7 ml LV SV MOD A4C:     76.6 ml LV SV MOD BP:      38.9 ml RIGHT VENTRICLE             IVC RV Basal diam:  2.20 cm     IVC diam: 1.00 cm RV S prime:     12.80 cm/s TAPSE (M-mode): 2.4 cm LEFT  ATRIUM           Index        RIGHT ATRIUM          Index LA diam:      3.60 cm 1.96 cm/m   RA Area:     8.72 cm LA Vol (A2C): 61.6 ml 33.55 ml/m  RA Volume:   14.90 ml 8.12 ml/m LA Vol (A4C): 30.8 ml 16.78 ml/m  AORTIC VALVE LVOT Vmax:   99.80 cm/s LVOT Vmean:  63.300 cm/s LVOT VTI:    0.220 m  AORTA Ao Root diam: 2.80 cm Ao Asc diam:  3.00 cm MITRAL VALVE                TRICUSPID VALVE MV Area (PHT): 5.34 cm     TR Peak grad:   36.0 mmHg MV Decel Time: 142 msec     TR Vmax:        300.00 cm/s MV E velocity: 0.72 cm/s MV A velocity: 108.00 cm/s  SHUNTS MV E/A ratio:  0.01         Systemic VTI:  0.22 m                             Systemic Diam: 1.70 cm Annabella Scarce MD Electronically signed by Annabella Scarce MD Signature Date/Time: 12/30/2023/3:12:00 PM    Final    CT ANGIO GI BLEED Result Date: 12/30/2023 CLINICAL DATA:  Lower GI hemorrhage on blood thinners EXAM: CTA ABDOMEN AND PELVIS WITHOUT AND WITH CONTRAST TECHNIQUE: Multidetector CT imaging of the abdomen and pelvis was performed using the standard protocol during bolus administration of intravenous contrast. Multiplanar reconstructed images and MIPs were obtained and reviewed to evaluate the vascular anatomy. RADIATION DOSE REDUCTION: This exam was performed according to the departmental dose-optimization program which includes automated exposure control, adjustment of the mA and/or kV according to patient size and/or use of iterative reconstruction technique. CONTRAST:  75mL OMNIPAQUE  IOHEXOL  350 MG/ML SOLN COMPARISON:  None Available. FINDINGS: VASCULAR Aorta: Atherosclerotic calcifications are noted without aneurysmal dilatation or dissection. Celiac: Patent without evidence of aneurysm, dissection, vasculitis or significant stenosis. SMA: Patent without evidence of aneurysm, dissection, vasculitis or significant stenosis. Renals: Both renal arteries are patent without evidence of aneurysm, dissection, vasculitis, fibromuscular dysplasia or  significant stenosis. IMA: Patent without  evidence of aneurysm, dissection, vasculitis or significant stenosis. Inflow: Iliacs demonstrate atherosclerotic calcification without aneurysmal dilatation or dissection. Veins: No specific venous abnormality is noted. Review of the MIP images confirms the above findings. NON-VASCULAR Lower chest: Mild scarring is noted in the bases bilaterally. Hepatobiliary: No focal liver abnormality is seen. Status post cholecystectomy. No biliary dilatation. Pancreas: Unremarkable. No pancreatic ductal dilatation or surrounding inflammatory changes. Spleen: Splenic cyst is noted.  No other focal abnormality is noted. Adrenals/Urinary Tract: Adrenal glands are within normal limits. Kidneys demonstrate renal cystic change bilaterally. No follow-up is recommended. No renal calculi or obstructive changes are seen. The bladder is partially distended. Stomach/Bowel: Diverticular change of the colon is noted without diverticulitis. No obstructive changes are seen in the colon. The appendix has been surgically removed. Small bowel is within normal limits. Stomach is distended with ingested fluid. No findings to suggest active GI hemorrhage are seen. No pooling of contrast is noted. Lymphatic: No lymphadenopathy is noted. Reproductive: Uterus is well visualized with multiple exophytic fibroids. No discrete adnexal mass is noted. Other: No abdominal wall hernia or abnormality. No abdominopelvic ascites. Musculoskeletal: No acute or significant osseous findings. IMPRESSION: VASCULAR No findings to suggest active GI hemorrhage. Atherosclerotic calcifications. NON-VASCULAR Diverticulosis without diverticulitis. Multiple uterine fibroids. Electronically Signed   By: Oneil Devonshire M.D.   On: 12/30/2023 00:48   DG Chest 2 View Result Date: 12/29/2023 CLINICAL DATA:  chestpain EXAM: CHEST - 2 VIEW COMPARISON:  Chest x-ray 01/09/2023. FINDINGS: The heart and mediastinal contours are unchanged.  Atherosclerotic plaque. No focal consolidation. No pulmonary edema. No pleural effusion. No pneumothorax. No acute osseous abnormality. IMPRESSION: 1. No active cardiopulmonary disease. 2.  Aortic Atherosclerosis (ICD10-I70.0). Electronically Signed   By: Morgane  Naveau M.D.   On: 12/29/2023 19:38   CARDIAC CATHETERIZATION Result Date: 12/23/2023 Table formatting from the original result was not included. Images from the original result were not included.   Prox RCA-1 lesion is 50% stenosed. Prox RCA-2 lesion is 80% stenosed.  Mid RCA lesion is 80% stenosed with 40% stenosed side branch in Acute Mrg.   Scoring balloon angioplasty was performed using a BALLOON SCOREFLEX 4.0X10 at both 80% lesions and into the percent stenosis.   Prox RCA-2 lesion is 80% stenosed.   A drug-eluting stent was successfully placed covering all 3 lesions, using a STENT ONYX FRONTIER 4.0X38 postdilated to 4.2 mm.  Post intervention, there is a 0-15% residual stenosis with TIMI-3 flow maintained however small sidebranch was temporarily occluded and recovered prior to completion of procedure.   Post intervention, there is a 0% residual stenosis.   The left ventricular systolic function is normal. The left ventricular ejection fraction is 55-65% by visual estimate.   LV end diastolic pressure is normal.   There is mild aortic valve stenosis. There is no aortic valve regurgitation.   Continue risk factor GDMT-is on Repatha  for lipids.  Blood pressure grossly well-controlled.   Recommend uninterrupted dual antiplatelet therapy with Aspirin  81mg  daily and Clopidogrel  75mg  daily for a minimum of 6 months (stable ischemic heart disease-Class I recommendation). Diagnostic: Dominance: Right       Intervention    RECOMMENDATIONS   I  Prox RCA-1 lesion is 50% stenosed. Prox RCA-2 lesion is 80% stenosed.  Mid RCA lesion is 80% stenosed with 40% stenosed side branch in Acute Mrg.   Scoring balloon angioplasty was performed using a BALLOON SCOREFLEX  4.0X10 at both 80% lesions and into the percent stenosis.   Prox RCA-2 lesion  is 80% stenosed.   A drug-eluting stent was successfully placed covering all 3 lesions, using a STENT ONYX FRONTIER 4.0X38 postdilated to 4.2 mm.  Post intervention, there is a 0-15% residual stenosis with TIMI-3 flow maintained however small sidebranch was temporarily occluded and recovered prior to completion of procedure.   Post intervention, there is a 0% residual stenosis.   The left ventricular systolic function is normal. The left ventricular ejection fraction is 55-65% by visual estimate.   LV end diastolic pressure is normal.   There is mild aortic valve stenosis. There is no aortic valve regurgitation.   Continue risk factor GDMT-is on Repatha  for lipids.  Blood pressure grossly well-controlled.   Recommend uninterrupted dual antiplatelet therapy with Aspirin  81mg  daily and Clopidogrel  75mg  daily for a minimum of 6 months (stable ischemic heart disease-Class I recommendation).   Alm Clay     Subjective:  She had BM yesterday, no melena, no dizziness, no lightheadedness, eager to go home Discharge Exam: Vitals:   01/03/24 0818 01/03/24 1245  BP: 118/76 121/75  Pulse: 81 82  Resp:    Temp: 97.6 F (36.4 C) 97.6 F (36.4 C)  SpO2: 97% 98%   Vitals:   01/03/24 0018 01/03/24 0459 01/03/24 0818 01/03/24 1245  BP: 121/61 125/70 118/76 121/75  Pulse: 79 76 81 82  Resp: 18 18    Temp: 98.5 F (36.9 C) 98.2 F (36.8 C) 97.6 F (36.4 C) 97.6 F (36.4 C)  TempSrc: Oral Oral Oral Oral  SpO2:   97% 98%  Weight:      Height:        General: Pt is alert, awake, not in acute distress Cardiovascular: RRR, S1/S2 +, no rubs, no gallops Respiratory: CTA bilaterally, no wheezing, no rhonchi Abdominal: Soft, NT, ND, bowel sounds + Extremities: no edema, no cyanosis    The results of significant diagnostics from this hospitalization (including imaging, microbiology, ancillary and laboratory) are listed  below for reference.     Microbiology: No results found for this or any previous visit (from the past 240 hours).   Labs: BNP (last 3 results) No results for input(s): BNP in the last 8760 hours. Basic Metabolic Panel: Recent Labs  Lab 12/31/23 0505 01/01/24 0223 01/01/24 0700 01/02/24 0659 01/03/24 0202  NA 140 141 139 138 135  K 4.0 3.7 3.3* 3.5 3.6  CL 106 104 106 104 103  CO2 21* 22 24 24 25   GLUCOSE 162* 125* 118* 118* 132*  BUN 52* 29* 25* 14 12  CREATININE 0.93 0.78 0.78 0.88 0.87  CALCIUM  8.3* 8.2* 8.2* 8.5* 8.5*   Liver Function Tests: Recent Labs  Lab 12/29/23 2249 12/30/23 1414  AST 26 24  ALT 18 18  ALKPHOS 51 47  BILITOT 0.4 0.5  PROT 5.1* 5.1*  ALBUMIN 2.6* 2.5*   Recent Labs  Lab 12/29/23 2249  LIPASE 34   No results for input(s): AMMONIA in the last 168 hours. CBC: Recent Labs  Lab 12/31/23 0505 01/01/24 0223 01/01/24 1401 01/01/24 2150 01/02/24 0659 01/03/24 0202 01/03/24 1321  WBC 10.1 8.7  --   --  7.0 7.6 7.3  NEUTROABS 7.0 5.9  --   --   --   --   --   HGB 7.3* 9.8* 10.5* 10.2* 10.0* 9.0* 10.7*  HCT 23.2* 29.5* 32.3* 31.6* 30.4* 27.1* 33.1*  MCV 96.3 91.6  --   --  92.1 92.2 93.8  PLT 188 191  --   --  189  195 262   Cardiac Enzymes: No results for input(s): CKTOTAL, CKMB, CKMBINDEX, TROPONINI in the last 168 hours. BNP: Invalid input(s): POCBNP CBG: Recent Labs  Lab 01/02/24 1205 01/02/24 1556 01/02/24 2110 01/03/24 0816 01/03/24 1243  GLUCAP 165* 118* 133* 139* 147*   D-Dimer No results for input(s): DDIMER in the last 72 hours. Hgb A1c No results for input(s): HGBA1C in the last 72 hours. Lipid Profile No results for input(s): CHOL, HDL, LDLCALC, TRIG, CHOLHDL, LDLDIRECT in the last 72 hours. Thyroid  function studies No results for input(s): TSH, T4TOTAL, T3FREE, THYROIDAB in the last 72 hours.  Invalid input(s): FREET3 Anemia work up No results for input(s):  VITAMINB12, FOLATE, FERRITIN, TIBC, IRON , RETICCTPCT in the last 72 hours. Urinalysis    Component Value Date/Time   COLORURINE YELLOW 08/20/2023 2100   APPEARANCEUR CLEAR 08/20/2023 2100   APPEARANCEUR Cloudy (A) 07/05/2014 1146   LABSPEC 1.017 08/20/2023 2100   PHURINE 5.0 08/20/2023 2100   GLUCOSEU NEGATIVE 08/20/2023 2100   HGBUR NEGATIVE 08/20/2023 2100   BILIRUBINUR NEGATIVE 08/20/2023 2100   BILIRUBINUR Negative 07/24/2023 0854   BILIRUBINUR Negative 07/05/2014 1146   KETONESUR NEGATIVE 08/20/2023 2100   PROTEINUR 30 (A) 08/20/2023 2100   UROBILINOGEN 0.2 07/24/2023 0854   UROBILINOGEN 0.2 10/27/2014 1756   NITRITE NEGATIVE 08/20/2023 2100   LEUKOCYTESUR NEGATIVE 08/20/2023 2100   Sepsis Labs Recent Labs  Lab 01/01/24 0223 01/02/24 0659 01/03/24 0202 01/03/24 1321  WBC 8.7 7.0 7.6 7.3   Microbiology No results found for this or any previous visit (from the past 240 hours).   Time coordinating discharge: Over 30 minutes  SIGNED:   Brayton Lye, MD  [facility]  [email or web address] 01/05/2024, 3:21 PM Pager   If 7PM-7AM, please contact [email or web address] [email or web address]

## 2024-01-15 ENCOUNTER — Other Ambulatory Visit (HOSPITAL_COMMUNITY): Payer: Self-pay

## 2024-01-17 ENCOUNTER — Encounter

## 2024-01-17 DIAGNOSIS — G4733 Obstructive sleep apnea (adult) (pediatric): Secondary | ICD-10-CM

## 2024-01-21 ENCOUNTER — Encounter: Payer: Self-pay | Admitting: Nurse Practitioner

## 2024-01-21 ENCOUNTER — Ambulatory Visit: Attending: Nurse Practitioner | Admitting: Nurse Practitioner

## 2024-01-21 VITALS — BP 98/63 | HR 74 | Ht 63.0 in | Wt 162.0 lb

## 2024-01-21 DIAGNOSIS — Z8673 Personal history of transient ischemic attack (TIA), and cerebral infarction without residual deficits: Secondary | ICD-10-CM

## 2024-01-21 DIAGNOSIS — I2510 Atherosclerotic heart disease of native coronary artery without angina pectoris: Secondary | ICD-10-CM | POA: Diagnosis not present

## 2024-01-21 DIAGNOSIS — E785 Hyperlipidemia, unspecified: Secondary | ICD-10-CM

## 2024-01-21 DIAGNOSIS — R002 Palpitations: Secondary | ICD-10-CM

## 2024-01-21 DIAGNOSIS — R000 Tachycardia, unspecified: Secondary | ICD-10-CM | POA: Diagnosis not present

## 2024-01-21 DIAGNOSIS — G4733 Obstructive sleep apnea (adult) (pediatric): Secondary | ICD-10-CM

## 2024-01-21 DIAGNOSIS — I517 Cardiomegaly: Secondary | ICD-10-CM

## 2024-01-21 DIAGNOSIS — I5189 Other ill-defined heart diseases: Secondary | ICD-10-CM

## 2024-01-21 DIAGNOSIS — I10 Essential (primary) hypertension: Secondary | ICD-10-CM

## 2024-01-21 DIAGNOSIS — D649 Anemia, unspecified: Secondary | ICD-10-CM

## 2024-01-21 DIAGNOSIS — Z8719 Personal history of other diseases of the digestive system: Secondary | ICD-10-CM

## 2024-01-21 DIAGNOSIS — J449 Chronic obstructive pulmonary disease, unspecified: Secondary | ICD-10-CM

## 2024-01-21 DIAGNOSIS — E119 Type 2 diabetes mellitus without complications: Secondary | ICD-10-CM

## 2024-01-21 NOTE — Progress Notes (Addendum)
 Office Visit    Patient Name: Grace [name] Date of Encounter: 01/21/2024  Primary Care Provider:  Delores Rojelio Caldron, NP Primary Cardiologist:  Alm Clay, MD  Chief Complaint    68 year old female with a history of CAD s/p DES-RCA in 12/2023, hypertension, hyperlipidemia, hypothyroidism, type 2 diabetes, CVA, OSA, COPD, GI bleed, anemia, and GERD who presents for follow-up related to CAD s/p DES-RCA.   Past Medical History    Past Medical History:  Diagnosis Date   Abnormal vaginal bleeding    Anxiety    Asthma    Barrett's esophagus    with high grade dysplasia per endoscopy 05/2005 // followed by Dr. Lupita Commander ([facility])   Chronic back pain    Complication of anesthesia    per patient she went in respiratory failure in recovery room in 2017   COPD (chronic obstructive pulmonary disease) (HCC)    Diabetes mellitus without complication (HCC)    type 2   Duodenal ulcer disease    thought to be contributed at least partly by overuse of NSAIDs and salicylates (goody powdr)   Endometrial polyp 03/2003   s/p resection in 03/2003. 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   Migraine    Nevus    Personal history of failed conscious sedation 08/11/2011   Sleep apnea    does not use cpap   Stroke (HCC) 10/2000    Tachycardia    Thyroid  disease    Tobacco abuse 04/2012   quit   Past Surgical History:  Procedure Laterality Date   ADENOIDECTOMY     APPENDECTOMY  2017   BALLOON DILATION N/A 06/14/2020   Procedure: BALLOON DILATION;  Surgeon: Wilhelmenia Aloha Raddle., MD;  Location: [facility] ENDOSCOPY;  Service: Gastroenterology;  Laterality: N/A;   BIOPSY  02/15/2020   Procedure: BIOPSY;  Surgeon: Burnette Fallow, MD;  Location: [facility] ENDOSCOPY;  Service: Endoscopy;;   BIOPSY  06/14/2020   Procedure: BIOPSY;  Surgeon: Wilhelmenia Aloha Raddle., MD;  Location: [facility] ENDOSCOPY;  Service: Gastroenterology;;   BIOPSY  03/02/2023   Procedure: BIOPSY;  Surgeon: Saintclair Jasper, MD;  Location: [facility] ENDOSCOPY;  Service: Gastroenterology;;   BRONCHIAL BIOPSY  03/15/2020   Procedure: BRONCHIAL BIOPSIES;  Surgeon: Brenna Adine LITTIE, DO;  Location: [facility] ENDOSCOPY;  Service: Pulmonary;;   BRONCHIAL BRUSHINGS  03/15/2020   Procedure: BRONCHIAL BRUSHINGS;  Surgeon: Brenna Adine LITTIE, DO;  Location: [facility] ENDOSCOPY;  Service: Pulmonary;;   BRONCHIAL NEEDLE ASPIRATION BIOPSY  03/15/2020   Procedure: BRONCHIAL NEEDLE ASPIRATION BIOPSIES;  Surgeon: Brenna Adine LITTIE, DO;  Location: [facility] ENDOSCOPY;  Service: Pulmonary;;   BRONCHIAL WASHINGS  03/15/2020   Procedure: BRONCHIAL WASHINGS;  Surgeon: Brenna Adine LITTIE, DO;  Location: [facility] ENDOSCOPY;  Service: Pulmonary;;  CHOLECYSTECTOMY     COLONOSCOPY  02/07/2010   COLONOSCOPY WITH PROPOFOL  N/A 02/15/2020   Procedure: COLONOSCOPY WITH PROPOFOL ;  Surgeon: Burnette Fallow, MD;  Location: [facility] ENDOSCOPY;  Service: Endoscopy;  Laterality: N/A;   CORONARY IMAGING/OCT N/A 12/23/2023   Procedure: CORONARY IMAGING/OCT;  Surgeon: Anner Alm ORN, MD;  Location: [facility] INVASIVE CV LAB;  Service: Cardiovascular;  Laterality: N/A;   CORONARY STENT INTERVENTION N/A 12/23/2023   Procedure: CORONARY STENT INTERVENTION;  Surgeon: Anner Alm ORN, MD;  Location: [facility] INVASIVE CV LAB;  Service: Cardiovascular;  Laterality: N/A;    DILATION AND CURETTAGE OF UTERUS     DILATION AND CURETTAGE OF UTERUS N/A 10/19/2023   Procedure: DILATION AND CURETTAGE;  Surgeon: Jeralyn Crutch, MD;  Location: [facility] OR;  Service: Gynecology;  Laterality: N/A;  ANESTHESIA REQUEST TO BE UNDER IV CONSCIOUS SEDATION LIKE FOR A COLONOSCOPY IF POSSIBLE   ENDOSCOPIC MUCOSAL RESECTION N/A 06/14/2020   Procedure: ENDOSCOPIC MUCOSAL RESECTION;  Surgeon: Wilhelmenia Aloha Raddle., MD;  Location: [facility] ENDOSCOPY;  Service: Gastroenterology;  Laterality: N/A;   ESOPHAGOGASTRODUODENOSCOPY N/A 12/31/2023   Procedure: EGD (ESOPHAGOGASTRODUODENOSCOPY);  Surgeon: Rosalie Kitchens, MD;  Location: [facility] ENDOSCOPY;  Service: Gastroenterology;  Laterality: N/A;   ESOPHAGOGASTRODUODENOSCOPY (EGD) WITH PROPOFOL  N/A 02/15/2020   Procedure: ESOPHAGOGASTRODUODENOSCOPY (EGD) WITH PROPOFOL ;  Surgeon: Burnette Fallow, MD;  Location: [facility] ENDOSCOPY;  Service: Endoscopy;  Laterality: N/A;   ESOPHAGOGASTRODUODENOSCOPY (EGD) WITH PROPOFOL  N/A 06/14/2020   Procedure: ESOPHAGOGASTRODUODENOSCOPY (EGD) WITH PROPOFOL ;  Surgeon: Wilhelmenia Aloha Raddle., MD;  Location: [facility] ENDOSCOPY;  Service: Gastroenterology;  Laterality: N/A;   ESOPHAGOGASTRODUODENOSCOPY (EGD) WITH PROPOFOL  Left 03/02/2023   Procedure: ESOPHAGOGASTRODUODENOSCOPY (EGD) WITH PROPOFOL ;  Surgeon: Saintclair Jasper, MD;  Location: [facility] ENDOSCOPY;  Service: Gastroenterology;  Laterality: Left;   FOREIGN BODY REMOVAL  06/14/2020   Procedure: FOREIGN BODY REMOVAL;  Surgeon: Wilhelmenia Aloha Raddle., MD;  Location: [facility] ENDOSCOPY;  Service: Gastroenterology;;   HYSTEROSCOPY     LAPAROSCOPIC APPENDECTOMY N/A 09/23/2015   Procedure: APPENDECTOMY LAPAROSCOPIC;  Surgeon: Dann Hummer, MD;  Location: [facility] OR;  Service: General;  Laterality: N/A;   LEFT HEART CATH AND CORONARY ANGIOGRAPHY N/A 12/23/2023   Procedure: LEFT HEART CATH AND CORONARY ANGIOGRAPHY;  Surgeon: Anner Alm ORN, MD;  Location: [facility] INVASIVE CV LAB;  Service: Cardiovascular;  Laterality: N/A;    MYOMECTOMY  1990, 1997   x 2 - In 1997, noted to have extensive pelvic adhesions and BL tubal obstruction   POLYPECTOMY  02/15/2020   Procedure: POLYPECTOMY;  Surgeon: Burnette Fallow, MD;  Location: [facility] ENDOSCOPY;  Service: Endoscopy;;   TONSILLECTOMY     TRANSTHORACIC ECHOCARDIOGRAM  06/2014    EF 55-60%.  No RWMA. Gr 1 DD. Cannot R/o PFO (consider bubble study).   UPPER ESOPHAGEAL ENDOSCOPIC ULTRASOUND (EUS) N/A 06/14/2020   Procedure: UPPER ESOPHAGEAL ENDOSCOPIC ULTRASOUND (EUS);  Surgeon: Wilhelmenia Aloha Raddle., MD;  Location: [facility] ENDOSCOPY;  Service: Gastroenterology;  Laterality: N/A;   UPPER GASTROINTESTINAL ENDOSCOPY  12/18/2009   VIDEO BRONCHOSCOPY WITH ENDOBRONCHIAL NAVIGATION N/A 03/15/2020   Procedure: VIDEO BRONCHOSCOPY WITH ENDOBRONCHIAL NAVIGATION;  Surgeon: Brenna Adine CROME, DO;  Location: [facility] ENDOSCOPY;  Service: Pulmonary;  Laterality: N/A;   VIDEO BRONCHOSCOPY WITH ENDOBRONCHIAL ULTRASOUND  03/15/2020   Procedure: VIDEO BRONCHOSCOPY WITH ENDOBRONCHIAL ULTRASOUND;  Surgeon: Brenna Adine CROME, DO;  Location: [facility] ENDOSCOPY;  Service: Pulmonary;;   WISDOM TOOTH EXTRACTION      Allergies  Allergies  Allergen Reactions   Amlodipine      Cramps    Codeine     Headaches  Gabapentin      headache   Prozac [Fluoxetine Hcl] Other (See Comments)    Suicidal ideations     Labs/Other Studies Reviewed    The following studies were reviewed today:  Cardiac Studies & Procedures   ______________________________________________________________________________________________ CARDIAC CATHETERIZATION  CARDIAC CATHETERIZATION 12/23/2023  Conclusion Table formatting from the original result was not included. Images from the original result were not included.    Prox RCA-1 lesion is 50% stenosed. Prox RCA-2 lesion is 80% stenosed.  Mid RCA lesion is 80% stenosed with 40% stenosed side branch in Acute Mrg.   Scoring balloon angioplasty was performed using a BALLOON SCOREFLEX 4.0X10 at  both 80% lesions and into the percent stenosis.   Prox RCA-2 lesion is 80% stenosed.   A drug-eluting stent was successfully placed covering all 3 lesions, using a STENT ONYX FRONTIER 4.0X38 postdilated to 4.2 mm.  Post intervention, there is a 0-15% residual stenosis with TIMI-3 flow maintained however small sidebranch was temporarily occluded and recovered prior to completion of procedure.   Post intervention, there is a 0% residual stenosis.   The left ventricular systolic function is normal. The left ventricular ejection fraction is 55-65% by visual estimate.   LV end diastolic pressure is normal.   There is mild aortic valve stenosis. There is no aortic valve regurgitation.   Continue risk factor GDMT-is on Repatha  for lipids.  Blood pressure grossly well-controlled.   Recommend uninterrupted dual antiplatelet therapy with Aspirin  81mg  daily and Clopidogrel  75mg  daily for a minimum of 6 months (stable ischemic heart disease-Class I recommendation).  Diagnostic: Dominance: Right       Intervention    RECOMMENDATIONS   I  Prox RCA-1 lesion is 50% stenosed. Prox RCA-2 lesion is 80% stenosed.  Mid RCA lesion is 80% stenosed with 40% stenosed side branch in Acute Mrg.   Scoring balloon angioplasty was performed using a BALLOON SCOREFLEX 4.0X10 at both 80% lesions and into the percent stenosis.   Prox RCA-2 lesion is 80% stenosed.   A drug-eluting stent was successfully placed covering all 3 lesions, using a STENT ONYX FRONTIER 4.0X38 postdilated to 4.2 mm.  Post intervention, there is a 0-15% residual stenosis with TIMI-3 flow maintained however small sidebranch was temporarily occluded and recovered prior to completion of procedure.   Post intervention, there is a 0% residual stenosis.   The left ventricular systolic function is normal. The left ventricular ejection fraction is 55-65% by visual estimate.   LV end diastolic pressure is normal.   There is mild aortic valve stenosis. There is no  aortic valve regurgitation.   Continue risk factor GDMT-is on Repatha  for lipids.  Blood pressure grossly well-controlled.   Recommend uninterrupted dual antiplatelet therapy with Aspirin  81mg  daily and Clopidogrel  75mg  daily for a minimum of 6 months (stable ischemic heart disease-Class I recommendation).     Alm Clay  Findings Coronary Findings Diagnostic  Dominance: Right  Left Circumflex Vessel is normal in caliber. Vessel is angiographically normal.  First Obtuse Marginal Branch Vessel is small in size.  Second Obtuse Marginal Branch Vessel is small in size.  Left Posterior Atrioventricular Artery Vessel is small in size.  Right Coronary Artery Prox RCA-1 lesion is 50% stenosed. The lesion is eccentric. The lesion is mildly calcified. Prox RCA-2 lesion is 80% stenosed. The lesion is focal and eccentric. Proximal to ectatic segment and bend The lesion is moderately calcified. Mid RCA lesion is 80% stenosed with 40% stenosed side branch in Acute Mrg. The lesion  is eccentric. The lesion is moderately calcified. Optical coherence tomography (OCT) was performed. The dragonfly OCT catheter was advanced and was not able to cross this third lesion.  Even when deployed after stent placement, pullback did not happen.  Acute Marginal Branch Vessel is small in size. Temporary no reflow down this vessel with stent placement across it and postdilation.  With IC NTG, TIMI II flow restored by the end the procedure.  Right Ventricular Branch Vessel is small in size.  Intervention  Prox RCA-1 lesion Stent (Also treats lesions: Prox RCA-2, and Mid RCA with side branch in Acute Mrg) Lesion length:  35 mm. CATH LAUNCHER 6FR AL.75 guide catheter was inserted. Lesion crossed with guidewire using a WIRE ASAHI PROWATER 180CM. A drug-eluting stent was successfully placed using a STENT ONYX FRONTIER 4.0X38. Maximum pressure: 18 atm. Inflation time: 30 sec. Stent strut is well apposed.  Post-stent angioplasty was performed using a BALLOON SCOREFLEX 4.0X10. Maximum pressure:  20 atm. Inflation time:  20 sec. Multiple inflations throughout the stented segment Post-Intervention Lesion Assessment The intervention was successful. Pre-interventional TIMI flow is 2. Post-intervention TIMI flow is 3. Treated lesion length:  38 mm. No complications occurred at this lesion. There is a 0% residual stenosis post intervention.  Prox RCA-2 lesion Stent (Also treats lesions: Prox RCA-1, and Mid RCA with side branch in Acute Mrg) See details in Prox RCA-1 lesion. Post-Intervention Lesion Assessment The intervention was successful. Pre-interventional TIMI flow is 3. Post-intervention TIMI flow is 3. There is a 0% residual stenosis post intervention.  Mid RCA lesion with side branch in Acute Mrg Angioplasty - Main Branch Lesion length:  8 mm. CATH LAUNCHER 6FR AL.75 guide catheter was inserted. WIRE ASAHI PROWATER 180CM guidewire used to cross lesion. Scoring balloon angioplasty was performed using a BALLOON SCOREFLEX 4.0X10. Maximum pressure: 12 atm. Inflation time: 20 sec.  A second ballloon was used, using a standard  BALLOON EMERGE MR 3.5X12. Maximum pressure:  12 atm. Inflation time:  20 sec. All 3 lesions treated together with 1 stent covering the mall. Stent - Main Branch (Also treats lesions: Prox RCA-1, and Prox RCA-2) See details in Prox RCA-1 lesion. Post-Intervention Lesion Assessment The intervention was successful. Pre-interventional TIMI flow is 3. Post-intervention TIMI flow is 3. Treated lesion length:  38 mm. Small sidebranch occluded temporarily. There is a 0% residual stenosis in the main branch post intervention. There is a 0% residual stenosis in the side branch post intervention.     ECHOCARDIOGRAM  ECHOCARDIOGRAM COMPLETE 12/30/2023  Narrative ECHOCARDIOGRAM REPORT    Patient Name:   TASNEEM CORMIER Date of Exam: 12/30/2023 Medical Rec #:  [id number]        Height:       63.0 in Accession #:    [id number]      Weight:       177.0 lb Date of Birth:  13-Aug-1955       BSA:          1.836 m Patient Age:    68 years        BP:           131/77 mmHg Patient Gender: F               HR:           89 bpm. Exam Location:  Inpatient  Procedure: 2D Echo (Both Spectral and Color Flow Doppler were utilized during procedure).  Indications:    CP  History:  Patient has prior history of Echocardiogram examinations. Signs/Symptoms:Chest Pain; Risk Factors:Diabetes and Hypertension.  Sonographer:    Norleen Amour Referring Phys: DARRYLE NED O'NEAL  IMPRESSIONS   1. Left ventricular ejection fraction, by estimation, is 55 to 60%. The left ventricle has normal function. The left ventricle has no regional wall motion abnormalities. There is mild concentric left ventricular hypertrophy. Left ventricular diastolic parameters were normal. 2. Right ventricular systolic function is normal. The right ventricular size is normal. There is mildly elevated pulmonary artery systolic pressure. 3. The mitral valve is normal in structure. No evidence of mitral valve regurgitation. No evidence of mitral stenosis. 4. The aortic valve is tricuspid. Aortic valve regurgitation is not visualized. No aortic stenosis is present. 5. The inferior vena cava is normal in size with greater than 50% respiratory variability, suggesting right atrial pressure of 3 mmHg.  FINDINGS Left Ventricle: Left ventricular ejection fraction, by estimation, is 55 to 60%. The left ventricle has normal function. The left ventricle has no regional wall motion abnormalities. The left ventricular internal cavity size was normal in size. There is mild concentric left ventricular hypertrophy. Left ventricular diastolic parameters were normal. Normal left ventricular filling pressure.  Right Ventricle: The right ventricular size is normal. No increase in right ventricular wall thickness. Right  ventricular systolic function is normal. There is mildly elevated pulmonary artery systolic pressure. The tricuspid regurgitant velocity is 3.00 m/s, and with an assumed right atrial pressure of 3 mmHg, the estimated right ventricular systolic pressure is 39.0 mmHg.  Left Atrium: Left atrial size was normal in size.  Right Atrium: Right atrial size was normal in size.  Pericardium: There is no evidence of pericardial effusion.  Mitral Valve: The mitral valve is normal in structure. Mild mitral annular calcification. No evidence of mitral valve regurgitation. No evidence of mitral valve stenosis.  Tricuspid Valve: The tricuspid valve is normal in structure. Tricuspid valve regurgitation is mild . No evidence of tricuspid stenosis.  Aortic Valve: The aortic valve is tricuspid. Aortic valve regurgitation is not visualized. No aortic stenosis is present.  Pulmonic Valve: The pulmonic valve was normal in structure. Pulmonic valve regurgitation is not visualized. No evidence of pulmonic stenosis.  Aorta: The aortic root is normal in size and structure.  Venous: The inferior vena cava is normal in size with greater than 50% respiratory variability, suggesting right atrial pressure of 3 mmHg.  IAS/Shunts: No atrial level shunt detected by color flow Doppler.   LEFT VENTRICLE PLAX 2D LVIDd:         3.90 cm     Diastology LVIDs:         2.90 cm     LV e' medial:    0.09 cm/s LV PW:         1.10 cm     LV E/e' medial:  8.2 LV IVS:        1.20 cm     LV e' lateral:   0.10 cm/s LVOT diam:     1.70 cm     LV E/e' lateral: 7.0 LV SV:         50 LV SV Index:   27 LVOT Area:     2.27 cm  LV Volumes (MOD) LV vol d, MOD A2C: 59.8 ml LV vol d, MOD A4C: 76.6 ml LV vol s, MOD A2C: 22.1 ml LV vol s, MOD A4C: 36.4 ml LV SV MOD A2C:     37.7 ml LV SV MOD A4C:  76.6 ml LV SV MOD BP:      38.9 ml  RIGHT VENTRICLE             IVC RV Basal diam:  2.20 cm     IVC diam: 1.00 cm RV S prime:      12.80 cm/s TAPSE (M-mode): 2.4 cm  LEFT ATRIUM           Index        RIGHT ATRIUM          Index LA diam:      3.60 cm 1.96 cm/m   RA Area:     8.72 cm LA Vol (A2C): 61.6 ml 33.55 ml/m  RA Volume:   14.90 ml 8.12 ml/m LA Vol (A4C): 30.8 ml 16.78 ml/m AORTIC VALVE LVOT Vmax:   99.80 cm/s LVOT Vmean:  63.300 cm/s LVOT VTI:    0.220 m  AORTA Ao Root diam: 2.80 cm Ao Asc diam:  3.00 cm  MITRAL VALVE                TRICUSPID VALVE MV Area (PHT): 5.34 cm     TR Peak grad:   36.0 mmHg MV Decel Time: 142 msec     TR Vmax:        300.00 cm/s MV E velocity: 0.72 cm/s MV A velocity: 108.00 cm/s  SHUNTS MV E/A ratio:  0.01         Systemic VTI:  0.22 m Systemic Diam: 1.70 cm  Annabella Scarce MD Electronically signed by Annabella Scarce MD Signature Date/Time: 12/30/2023/3:12:00 PM    Final      CT SCANS  CT CORONARY MORPH W/CTA COR W/SCORE 08/29/2022  Addendum 09/03/2022 10:57 PM ADDENDUM REPORT: 09/03/2022 22:55  EXAM: OVER-READ INTERPRETATION  CT CHEST  The following report is an over-read performed by radiologist Dr. Leita Sandy [facility] on 09/03/2022. This over-read does not include interpretation of cardiac or coronary anatomy or pathology. The cardiac/coronary CTA interpretation by the cardiologist is attached.  COMPARISON:  09/25/2021.  FINDINGS: Heart is borderline enlarged and there is no pericardial effusion. Three-vessel coronary artery calcifications are noted. There is mild atherosclerotic calcification of the aorta without evidence of aneurysm. The pulmonary trunk is normal in caliber.  No mediastinal lymphadenopathy. The visualized portion of the esophagus is within normal limits.  Mild centrilobular emphysematous changes are present in the lungs. Atelectasis or scarring is noted at the lung bases. No effusion or pneumothorax.  Fatty infiltration of the liver is noted.  No acute abnormality.  Degenerative changes are noted  in the thoracic spine. No acute osseous abnormality.  IMPRESSION: 1. Emphysema. 2. Coronary artery calcifications. 3. Aortic atherosclerosis. 4. Hepatic steatosis.   Electronically Signed By: Leita Birmingham M.D. On: 09/03/2022 22:55  Narrative CLINICAL DATA:  68 year old with chest pain  EXAM: Cardiac/Coronary  CTA  TECHNIQUE: The patient was scanned on a [employer].  FINDINGS: A 120 kV prospective scan was triggered in the descending thoracic aorta at 111 HU's. Axial non-contrast 3 mm slices were carried out through the heart. The data set was analyzed on a dedicated work station and scored using the Agatson method. Gantry rotation speed was 250 msecs and collimation was .6 mm. 0.8 mg of sl NTG was given. The 3D data set was reconstructed in 5% intervals of the 67-82 % of the R-R cycle. Diastolic phases were analyzed on a dedicated work station using MPR, MIP and VRT modes. The patient received  80 cc of contrast.  Image quality: good  Aorta:  Normal size.  No calcifications.  No dissection.  Aortic Valve:  Trileaflet.  No calcifications.  Coronary Arteries:  Normal coronary origin.  Right dominance.  RCA is a large dominant artery that gives rise to PDA and PLA. There is dense proximal/mid mixed plaque 70-99% stenosis. FFR 0.98 to 0.83 mid, and 0.83 to 0.65 mid to distal (abnormal).  Left main is a large artery that gives rise to LAD and LCX arteries.  LAD is a large vessel that has dense mixed plaque proximal/mid with 30-49% stenosis and tapering of mid vessel. No FFR modeling of mid to distal vessel.  D1-small caliber bifurcating vessel with calcified plaque.  LCX is a non-dominant artery. There is diffuse mixed plaque, mild stenosis.  OM1-moderate sized vessel.  No stenosis.  Other findings:  Normal pulmonary vein drainage into the left atrium.  Normal left atrial appendage without a thrombus.  Normal size of the pulmonary  artery.  Please see radiology report for non cardiac findings.  IMPRESSION: 1. Coronary calcium  score of 2262. This was 59 percentile for age and sex matched control.  2. Total plaque volume (TPV) 1356 mm3 which is 97 percentile for age-and sex matched controls (calcified plaque 526 mm3; non-calcified plaque 830 mm3). TPV is extensive.  3. Normal coronary origin with right dominance.  4. Severe CAD of mid RCA (FFR abnormal), mild proximal LAD and CX. Possible significant disease of mid LAD to distal (not modeled by FFR).  Electronically Signed: By: Oneil Parchment M.D. On: 08/29/2022 14:58     ______________________________________________________________________________________________     Recent Labs: 02/26/2023: Pro B Natriuretic peptide (BNP) 234.0 12/30/2023: ALT 18 01/03/2024: BUN 12; Creatinine, Ser 0.87; Hemoglobin 10.7; Platelets 262; Potassium 3.6; Sodium 135  Recent Lipid Panel    Component Value Date/Time   CHOL 64 (L) 12/08/2023 1201   TRIG 68 12/08/2023 1201   HDL 36 (L) 12/08/2023 1201   CHOLHDL 1.8 12/08/2023 1201   CHOLHDL 2.6 09/28/2019 1211   VLDL 26 09/19/2016 1512   LDLCALC 12 12/08/2023 1201   LDLCALC 56 09/28/2019 1211   LDLDIRECT 104 (H) 06/12/2010 1641    History of Present Illness    68 year old female with the above past medical history including CAD s/p DES-RCA in 12/2023, hypertension, hyperlipidemia, hypothyroidism, type 2 diabetes, CVA, OSA, COPD, GI bleed, anemia, and GERD.   She has a history of COPD, former tobacco use, follows with pulmonology.  She has a strong family history of CAD. She contacted our office on 08/18/2022 and noted that she had an echocardiogram with her PCP who recommended follow-up with cardiology based on the results.  Echocardiogram ultimately showed EF 60 to 65%, normal LV systolic function, mild LVH, G1 DD, no significant valvular abnormalities. Coronary CT angiogram in 08/2022 revealed coronary calcium  score 2262 (99th  percentile), extensive TPV, severe CAD of the mid RCA with abnormal FFR, mild proximal LAD and left circumflex stenoses, possible significant disease of the mid LAD to distal LAD (not modeled by FFR). Findings were discussed with Dr. Anner- in the absence of symptoms initial trial of medical therapy was advised.  She was started on Imdur .  Aspirin , high-dose statin, and beta-blocker were also advised.  Given statin intolerance, she was referred to lipid clinic Pharm.D. for consideration of PCSK9 inhibitor.  It was noted that should she fail medical therapy, cardiac catheterization could be advised.   She was hospitalized in November 2024 in the setting of  severe symptomatic anemia, hemoglobin 6.4  Endoscopy showed Barrett's esophagus. She underwent D&C for abnormal uterine bleeding in 10/2023.  She was last seen in the office on 12/08/2023 and reported ongoing dyspnea, daily chest pain, palpitations.  Cardiac monitor in 11/2023, preliminary result revealed predominantly normal sinus rhythm, run run of VT lasting 7 beats, 1829 runs of SVT, longest lasting 31.4 seconds, rare PACs and PVCs.  She underwent outpatient cardiac catheterization on 12/23/2023 which revealed 50 to 80% proximal to mid RCA stenosis s/p DES x 1 covering all 3 lesions.  LV gram showed LVEF 55 to 60%, normal LVEDP.  She was started on DAPT with aspirin  and Plavix .  She presented to the ED on 12/29/2023 in the setting of abdominal pain, shortness of breath and chest discomfort.  She was admitted in the setting of acute blood loss anemia, upper GI bleed, likely from peripyloric ulcer.  She received a total of 4 units PRBC.  EGD on 12/31/2023 showed significant prepyloric ulcer.  She was treated with IV Protonix .  Aspirin  was held.  She was continued on Plavix .  Recommendations for resumption of aspirin  pending outpatient follow-up with GI.  She was discharged home in stable condition on 01/03/2024.   She presents today for follow-up.  Since her  procedure and recent hospitalization she has been stable overall from a cardiac standpoint. She notes occasional twinges in her chest, she denies any chest pain, she has stable dyspnea on exertion, overall unchanged from prior visits.  BP borderline low in office today, has been stable at home.  She denies any palpitations, dizziness, presyncope, syncope, edema, PND, orthopnea, weight gain.  Home Medications    Current Outpatient Medications  Medication Sig Dispense Refill   albuterol  (PROAIR  HFA) 108 (90 Base) MCG/ACT inhaler INHALE 2 PUFFS EVERY 6  HOURS AS NEEDED FOR  SHORTNESS OF BREATH OR  WHEEZING 34 g 3   bisoprolol  (ZEBETA ) 10 MG tablet Take 1 & 1/2  tablets (15 mg total) by mouth daily. 135 tablet 2   calcium  carbonate (TUMS - DOSED IN MG ELEMENTAL CALCIUM ) 500 MG chewable tablet Chew 1 tablet by mouth daily as needed for indigestion or heartburn.     clopidogrel  (PLAVIX ) 75 MG tablet Take 1 tablet (75 mg total) by mouth daily with breakfast. 90 tablet 2   diphenhydrAMINE  (BENADRYL ) 25 MG tablet Take 50 mg by mouth at bedtime as needed for sleep.     docusate sodium  (COLACE) 100 MG capsule Take 300 mg by mouth daily.     estradiol  (ESTRACE  VAGINAL) 0.1 MG/GM vaginal cream Place 1 Applicatorful vaginally at bedtime. (Patient taking differently: Place 1 Applicatorful vaginally 2 (two) times a week.) 42.5 g 12   ezetimibe  (ZETIA ) 10 MG tablet Take 10 mg by mouth daily.     fluticasone  (FLONASE ) 50 MCG/ACT nasal spray USE 2 SPRAYS IN BOTH NOSTRILS  DAILY 16 g 5   furosemide  (LASIX ) 40 MG tablet TAKE 1 TABLET BY MOUTH DAILY AS  NEEDED FOR WEIGHT GAIN OF 3 LB  OVER AT NIGHT OR 5 LB IN 1 WEEK 100 tablet 2   glucose blood (ONETOUCH VERIO) test strip Use to check blood sugar twice daily. Dx: E11.9 300 each 1   ipratropium-albuterol  (DUONEB) 0.5-2.5 (3) MG/3ML SOLN Take 3 mLs by nebulization every 6 (six) hours as needed. 360 mL 6   Lancets (ONETOUCH DELICA PLUS LANCET33G) MISC USE TO CHECK BLOOD  SUGAR  TWICE DAILY 200 each 3   levothyroxine  (SYNTHROID ) 200 MCG tablet  Take 200 mcg by mouth at bedtime.     nitroGLYCERIN  (NITROSTAT ) 0.4 MG SL tablet Place 0.4 mg under the tongue every 5 (five) minutes as needed for chest pain.     OZEMPIC, 0.25 OR 0.5 MG/DOSE, 2 MG/3ML SOPN Inject 0.5 mg into the skin once a week.     pantoprazole  (PROTONIX ) 40 MG tablet Take 1 tablet (40 mg total) by mouth 2 (two) times daily before a meal. 60 tablet 0   REPATHA  SURECLICK 140 MG/ML SOAJ INJECT 1 PEN SUBCUTANEOUSLY  EVERY 2 WEEKS 6 mL 3   Respiratory Therapy Supplies (NEBULIZER/TUBING/MOUTHPIECE) KIT 1 application by Does not apply route every 6 (six) hours as needed. FAX TO [phone] 1 kit 11   rOPINIRole  (REQUIP ) 0.5 MG tablet Take 1 tablet (0.5 mg total) by mouth at bedtime. 30 tablet 0   sucralfate  (CARAFATE ) 1 g tablet Take 1 tablet (1 g total) by mouth 4 (four) times daily. 120 tablet 0   traMADol  (ULTRAM ) 50 MG tablet Take 50 mg by mouth every 6 (six) hours as needed for moderate pain (pain score 4-6) or severe pain (pain score 7-10).     valACYclovir  (VALTREX ) 500 MG tablet TAKE 1 TABLET BY MOUTH  DAILY 90 tablet 3   Vibegron  75 MG TABS Take 1 tablet (75 mg total) by mouth daily. 90 tablet 3   BREZTRI  AEROSPHERE 160-9-4.8 MCG/ACT AERO inhaler USE 2 INHALATIONS BY MOUTH INTO  THE LUNGS IN THE MORNING AND AT  BEDTIME (Patient not taking: Reported on 01/21/2024) 32.1 g 3   pregabalin  (LYRICA ) 50 MG capsule Take 50 mg by mouth daily. (Patient not taking: Reported on 01/21/2024)     No current facility-administered medications for this visit.     Review of Systems    She denies chest pain, palpitations, pnd, orthopnea, n, v, dizziness, syncope, edema, weight gain, or early satiety. All other systems reviewed and are otherwise negative except as noted above.     Cardiac Rehabilitation Eligibility Assessment  The patient is ready to start cardiac rehabilitation from a cardiac standpoint.     Physical Exam    VS:  BP 98/63 (BP Location: Left Arm, Patient Position: Sitting, Cuff Size: Normal)   Pulse 74   Ht 5' 3 (1.6 m)   Wt 162 lb (73.5 kg)   LMP 02/27/2014   SpO2 96%   BMI 28.70 kg/m   GEN: Well nourished, well developed, in no acute distress. HEENT: normal. Neck: Supple, no JVD, carotid bruits, or masses. Cardiac: RRR, no murmurs, rubs, or gallops. No clubbing, cyanosis, edema.  Radials/DP/PT 2+ and equal bilaterally.  Respiratory:  Respirations regular and unlabored, clear to auscultation bilaterally. GI: Soft, nontender, nondistended, BS + x 4. MS: no deformity or atrophy. Skin: warm and dry, no rash. Neuro:  Strength and sensation are intact. Psych: Normal affect.  Accessory Clinical Findings    ECG personally reviewed by me today - EKG Interpretation Date/Time:  Thursday January 21 2024 10:13:05 EDT Ventricular Rate:  74 PR Interval:  160 QRS Duration:  90 QT Interval:  422 QTC Calculation: 468 R Axis:   50  Text Interpretation: Normal sinus rhythm Nonspecific ST and T wave abnormality When compared with ECG of 29-Dec-2023 19:41, PREVIOUS ECG IS PRESENT Confirmed by Daneen Perkins ([id number]) on 01/21/2024 10:16:30 AM  - no acute changes.   Lab Results  Component Value Date   WBC 7.3 01/03/2024   HGB 10.7 (L) 01/03/2024   HCT 33.1 (L) 01/03/2024  MCV 93.8 01/03/2024   PLT 262 01/03/2024   Lab Results  Component Value Date   CREATININE 0.87 01/03/2024   BUN 12 01/03/2024   NA 135 01/03/2024   K 3.6 01/03/2024   CL 103 01/03/2024   CO2 25 01/03/2024   Lab Results  Component Value Date   ALT 18 12/30/2023   AST 24 12/30/2023   ALKPHOS 47 12/30/2023   BILITOT 0.5 12/30/2023   Lab Results  Component Value Date   CHOL 64 (L) 12/08/2023   HDL 36 (L) 12/08/2023   LDLCALC 12 12/08/2023   LDLDIRECT 104 (H) 06/12/2010   TRIG 68 12/08/2023   CHOLHDL 1.8 12/08/2023    Lab Results  Component Value Date   HGBA1C 5.7 (H) 12/30/2023     Assessment & Plan    1. CAD: Coronary CT angiogram in 08/2022 revealed coronary calcium  score 2262 (99th percentile), extensive TPV, severe CAD of the mid RCA with abnormal FFR, mild proximal LAD and left circumflex stenoses, possible significant disease of the mid LAD to distal LAD (not mottled by FFR).  Findings were discussed with Dr. Anner and in the absence of symptoms, initial trial of medical therapy was advised. She continued to note daily chest pain despite increased Imdur  and ultimately underwent cardiac catheterization on 12/23/2023 which revealed 50 to 80% proximal to mid RCA stenosis s/p DES x 1 covering all 3 lesions.  LV gram showed LVEF 55 to 60%, normal LVEDP.she was subsequently hospitalized in the setting of acute upper GI bleed.  Aspirin  was held indefinitely.  She notes occasional twinges in her chest, she denies any chest pain, she has stable dyspnea on exertion, overall unchanged from prior visits.  Chest pain has improved significantly.  Will reach out to Dr. Anner regarding recommendations for DAPT at this time given recent PCI.  She has follow-up scheduled with GI in November 2025 as below.  Continue Plavix , bisoprolol , Zetia , and Repatha .  Addendum 01/26/2024: Per Dr. Anner, okay to continue to hold aspirin  indefinitely.  Continue Plavix .   2.  Palpitations/PSVT/NSVT: Cardiac monitor in 11/2023, preliminary result revealed predominantly normal sinus rhythm, run run of VT lasting 7 beats, 1829 runs of SVT, longest lasting 31.4 seconds, rare PACs and PVCs.  Most recent echo stable as below, LV gram showed LVEF 50 to 60% on recent cath.  She denies any recent palpitations.  Continue to monitor symptoms. Continue bisoprolol .  3. LVH/diastolic dysfunction: Echocardiogram in 07/2022 showed EF 60 to 65%, normal LV systolic function, mild LVH, G1 DD, no significant valvular abnormalities.  She has stable chronic dyspnea on exertion in the setting of COPD, generally euvolemic and  well compensated on exam.  Continue bisoprolol , Lasix  as needed.   4. Hypertension: BP well controlled, borderline low. Continue to monitor BP.  For now, continue current antihypertensive regimen.   5. Hyperlipidemia/statin intolerance: LDL was 12 in 11/2023. Continue Repatha  and Zetia .    6. Type 2 diabetes: A1c was 5.7 in 12/2023. Monitored and managed per PCP.    7. History of CVA: No recurrence. Continue aspirin , Repatha .   8. OSA: Intermittent adherence to CPAP.  She is pending repeat home sleep study per pulmonology.   9. COPD: Stable intermittent dyspnea. Follows with pulmonology.   10. Anemia/GI bleed: S/p recent D&C for abnormal uterine bleeding.  Recently started on DAPT in the setting of recent PCI/DES.  Subsequently admitted with upper GI bleed.  Required 4 units PRBC.  Hemoglobin was 10.7 at discharge.  Will check  CBC, CMET today.  Denies any recent bleeding.  Will reach out to Dr. Anner for recommendations regarding resumption of DAPT as above.  She has follow-up scheduled with GI in 02/2024.   11. Disposition: Follow-up in 3 months.       Damien JAYSON Braver, NP 01/21/2024, 10:45 AM

## 2024-01-21 NOTE — Patient Instructions (Signed)
 Medication Instructions:  Your physician recommends that you continue on your current medications as directed. Please refer to the Current Medication list given to you today.  *If you need a refill on your cardiac medications before your next appointment, please call your pharmacy*  Lab Work: CBC and CMET today If you have labs (blood work) drawn today and your tests are completely normal, you will receive your results only by: MyChart Message (if you have MyChart) OR A paper copy in the mail If you have any lab test that is abnormal or we need to change your treatment, we will call you to review the results.  Testing/Procedures: None  Follow-Up: At [facility], you and your health needs are our priority.  As part of our continuing mission to provide you with exceptional heart care, our providers are all part of one team.  This team includes your primary Cardiologist (physician) and Advanced Practice Providers or APPs (Physician Assistants and Nurse Practitioners) who all work together to provide you with the care you need, when you need it.  Your next appointment:   3 month(s)  Provider:   Damien Braver, NP    We recommend signing up for the patient portal called MyChart.  Sign up information is provided on this After Visit Summary.  MyChart is used to connect with patients for Virtual Visits (Telemedicine).  Patients are able to view lab/test results, encounter notes, upcoming appointments, etc.  Non-urgent messages can be sent to your provider as well.   To learn more about what you can do with MyChart, go to [email or web address].   Other Instructions Monitor blood pressure frequently. Report sustained systolic (top number) less than 100.

## 2024-01-22 LAB — CBC
Hematocrit: 40.6 % (ref 34.0–46.6)
Hemoglobin: 12.6 g/dL (ref 11.1–15.9)
MCH: 29.2 pg (ref 26.6–33.0)
MCHC: 31 g/dL — ABNORMAL LOW (ref 31.5–35.7)
MCV: 94 fL (ref 79–97)
Platelets: 264 x10E3/uL (ref 150–450)
RBC: 4.31 x10E6/uL (ref 3.77–5.28)
RDW: 13.7 % (ref 11.7–15.4)
WBC: 6 x10E3/uL (ref 3.4–10.8)

## 2024-01-22 LAB — COMPREHENSIVE METABOLIC PANEL WITH GFR
ALT: 28 IU/L (ref 0–32)
AST: 36 IU/L (ref 0–40)
Albumin: 3.8 g/dL — ABNORMAL LOW (ref 3.9–4.9)
Alkaline Phosphatase: 97 IU/L (ref 49–135)
BUN/Creatinine Ratio: 8 — ABNORMAL LOW (ref 12–28)
BUN: 8 mg/dL (ref 8–27)
Bilirubin Total: 0.5 mg/dL (ref 0.0–1.2)
CO2: 25 mmol/L (ref 20–29)
Calcium: 8.8 mg/dL (ref 8.7–10.3)
Chloride: 99 mmol/L (ref 96–106)
Creatinine, Ser: 0.96 mg/dL (ref 0.57–1.00)
Globulin, Total: 2.3 g/dL (ref 1.5–4.5)
Glucose: 98 mg/dL (ref 70–99)
Potassium: 3.2 mmol/L — ABNORMAL LOW (ref 3.5–5.2)
Sodium: 141 mmol/L (ref 134–144)
Total Protein: 6.1 g/dL (ref 6.0–8.5)
eGFR: 64 mL/min/1.73 (ref >59)

## 2024-01-26 ENCOUNTER — Telehealth (HOSPITAL_COMMUNITY): Payer: Self-pay

## 2024-01-26 ENCOUNTER — Ambulatory Visit: Payer: Self-pay | Admitting: Nurse Practitioner

## 2024-01-26 ENCOUNTER — Other Ambulatory Visit: Payer: Self-pay

## 2024-01-26 ENCOUNTER — Other Ambulatory Visit (HOSPITAL_COMMUNITY): Payer: Self-pay

## 2024-01-26 ENCOUNTER — Telehealth: Payer: Self-pay | Admitting: Nurse Practitioner

## 2024-01-26 DIAGNOSIS — Z79899 Other long term (current) drug therapy: Secondary | ICD-10-CM

## 2024-01-26 MED ORDER — POTASSIUM CHLORIDE CRYS ER 10 MEQ PO TBCR
EXTENDED_RELEASE_TABLET | ORAL | 3 refills | Status: AC
  Filled 2024-01-26 – 2024-01-28 (×2): qty 90, 87d supply, fill #0
  Filled 2024-04-20: qty 90, 90d supply, fill #1

## 2024-01-26 NOTE — Telephone Encounter (Signed)
 Duplicate note

## 2024-01-26 NOTE — Telephone Encounter (Signed)
 Pt returning call regarding results. Please advise

## 2024-01-26 NOTE — Telephone Encounter (Signed)
 Called patient regarding cardiac rehab, asked if patient had [place] Medicaid along with her [facility] Medicare as the [place] Medicaid is not listed in her chart. Patient is not sure if she has Medicaid, she was under the impression that her [facility] Dual Complete through [facility] covered everything and was not aware it was 2 separate coverages.   Patient will call back with her Medicaid information so we can check if she has Dual Complete or if it's just [facility] Medicare.

## 2024-01-26 NOTE — Progress Notes (Signed)
 Spoke with pt. Pt will d/c Aspirin  as directed per Dr. Anner.

## 2024-01-26 NOTE — Telephone Encounter (Signed)
 Confirmed patient does not have any Medicaid coverage, she is aware she is responsible for 20% coinsurance through [facility].  Patient returned call to get scheduled in the Cardiac Rehab Program. Patient will come in for orientation on 10/21 and will attend the 11:45 exercise class.  Sent MyChart message.

## 2024-01-26 NOTE — Telephone Encounter (Signed)
 I spoke with patient. She reports she already takes an OTC [place] brand potassium supplement. She takes 99 mg daily. I told patient I would make Damien Braver, PA aware and we would call her back with recommendations.

## 2024-01-26 NOTE — Telephone Encounter (Signed)
 See result note.

## 2024-01-27 ENCOUNTER — Encounter (HOSPITAL_COMMUNITY): Payer: Self-pay

## 2024-01-28 ENCOUNTER — Other Ambulatory Visit: Payer: Self-pay

## 2024-01-28 ENCOUNTER — Other Ambulatory Visit (HOSPITAL_COMMUNITY): Payer: Self-pay

## 2024-01-29 DIAGNOSIS — R069 Unspecified abnormalities of breathing: Secondary | ICD-10-CM | POA: Diagnosis not present

## 2024-01-29 DIAGNOSIS — G4733 Obstructive sleep apnea (adult) (pediatric): Secondary | ICD-10-CM | POA: Diagnosis not present

## 2024-02-01 ENCOUNTER — Other Ambulatory Visit (HOSPITAL_COMMUNITY): Payer: Self-pay

## 2024-02-01 MED ORDER — TRAMADOL HCL 50 MG PO TABS
50.0000 mg | ORAL_TABLET | Freq: Four times a day (QID) | ORAL | 2 refills | Status: AC | PRN
  Filled 2024-02-01 – 2024-02-02 (×2): qty 90, 23d supply, fill #0
  Filled 2024-05-11: qty 90, 23d supply, fill #1

## 2024-02-02 ENCOUNTER — Other Ambulatory Visit (HOSPITAL_COMMUNITY): Payer: Self-pay

## 2024-02-02 ENCOUNTER — Encounter (HOSPITAL_COMMUNITY)
Admission: RE | Admit: 2024-02-02 | Discharge: 2024-02-02 | Disposition: A | Discharge status: Home / Self Care | Source: Ambulatory Visit | Attending: Cardiology | Admitting: Cardiology

## 2024-02-02 VITALS — BP 138/86 | HR 81 | Ht 62.5 in | Wt 164.7 lb

## 2024-02-02 DIAGNOSIS — Z955 Presence of coronary angioplasty implant and graft: Secondary | ICD-10-CM | POA: Diagnosis present

## 2024-02-02 NOTE — Progress Notes (Signed)
 Cardiac Individual Treatment Plan  Patient Details  Name: Grace [name] MRN: [id number] Date of Birth: July 22, 1955 Referring Provider:   Flowsheet Row INTENSIVE CARDIAC REHAB ORIENT from 02/02/2024 in [facility]  Referring Provider Anner Alm ORN, MD    Initial Encounter Date:  Flowsheet Row INTENSIVE CARDIAC REHAB ORIENT from 02/02/2024 in [facility]  Date 02/02/24    Visit Diagnosis: 12/23/23 Status post coronary artery stent placement DES RCA  Patient's Home Medications on Admission:  Current Outpatient Medications:    albuterol  (PROAIR  HFA) 108 (90 Base) MCG/ACT inhaler, INHALE 2 PUFFS EVERY 6  HOURS AS NEEDED FOR  SHORTNESS OF BREATH OR  WHEEZING, Disp: 34 g, Rfl: 3   bisoprolol  (ZEBETA ) 10 MG tablet, Take 1 & 1/2  tablets (15 mg total) by mouth daily., Disp: 135 tablet, Rfl: 2   calcium  carbonate (TUMS - DOSED IN MG ELEMENTAL CALCIUM ) 500 MG chewable tablet, Chew 1 tablet by mouth daily as needed for indigestion or heartburn., Disp: , Rfl:    clopidogrel  (PLAVIX ) 75 MG tablet, Take 1 tablet (75 mg total) by mouth daily with breakfast., Disp: 90 tablet, Rfl: 2   diphenhydrAMINE  (BENADRYL ) 25 MG tablet, Take 50 mg by mouth at bedtime as needed for sleep., Disp: , Rfl:    docusate sodium  (COLACE) 100 MG capsule, Take 300 mg by mouth daily., Disp: , Rfl:    estradiol  (ESTRACE  VAGINAL) 0.1 MG/GM vaginal cream, Place 1 Applicatorful vaginally at bedtime., Disp: 42.5 g, Rfl: 12   ezetimibe  (ZETIA ) 10 MG tablet, Take 10 mg by mouth daily., Disp: , Rfl:    fluticasone  (FLONASE ) 50 MCG/ACT nasal spray, USE 2 SPRAYS IN BOTH NOSTRILS  DAILY, Disp: 16 g, Rfl: 5   furosemide  (LASIX ) 40 MG tablet, TAKE 1 TABLET BY MOUTH DAILY AS  NEEDED FOR WEIGHT GAIN OF 3 LB  OVER AT NIGHT OR 5 LB IN 1 WEEK, Disp: 100 tablet, Rfl: 2   ipratropium-albuterol  (DUONEB) 0.5-2.5 (3) MG/3ML SOLN,  Take 3 mLs by nebulization every 6 (six) hours as needed., Disp: 360 mL, Rfl: 6   levothyroxine  (SYNTHROID ) 200 MCG tablet, Take 200 mcg by mouth at bedtime., Disp: , Rfl:    nitroGLYCERIN  (NITROSTAT ) 0.4 MG SL tablet, Place 0.4 mg under the tongue every 5 (five) minutes as needed for chest pain., Disp: , Rfl:    OZEMPIC, 0.25 OR 0.5 MG/DOSE, 2 MG/3ML SOPN, Inject 0.5 mg into the skin once a week., Disp: , Rfl:    pantoprazole  (PROTONIX ) 40 MG tablet, Take 1 tablet (40 mg total) by mouth 2 (two) times daily before a meal., Disp: 60 tablet, Rfl: 0   potassium chloride  (KLOR-CON  M) 10 MEQ tablet, Take 2 tablets (20 mEq total) by mouth daily for 3 days, THEN 1 tablet (10 mEq total) daily., Disp: 90 tablet, Rfl: 3   pregabalin  (LYRICA ) 50 MG capsule, Take 50 mg by mouth daily., Disp: , Rfl:    REPATHA  SURECLICK 140 MG/ML SOAJ, INJECT 1 PEN SUBCUTANEOUSLY  EVERY 2 WEEKS, Disp: 6 mL, Rfl: 3   Respiratory Therapy Supplies (NEBULIZER/TUBING/MOUTHPIECE) KIT, 1 application by Does not apply route every 6 (six) hours as needed. FAX TO [phone], Disp: 1 kit, Rfl: 11   rOPINIRole  (REQUIP ) 0.5 MG tablet, Take 1 tablet (0.5 mg total) by mouth at bedtime., Disp: 30 tablet, Rfl: 0   sucralfate  (CARAFATE ) 1 g tablet, Take 1 tablet (1  g total) by mouth 4 (four) times daily., Disp: 120 tablet, Rfl: 0   traMADol  (ULTRAM ) 50 MG tablet, Take 50 mg by mouth every 6 (six) hours as needed for moderate pain (pain score 4-6) or severe pain (pain score 7-10)., Disp: , Rfl:    valACYclovir  (VALTREX ) 500 MG tablet, TAKE 1 TABLET BY MOUTH  DAILY, Disp: 90 tablet, Rfl: 3   Vibegron  75 MG TABS, Take 1 tablet (75 mg total) by mouth daily., Disp: 90 tablet, Rfl: 3   BREZTRI  AEROSPHERE 160-9-4.8 MCG/ACT AERO inhaler, USE 2 INHALATIONS BY MOUTH INTO  THE LUNGS IN THE MORNING AND AT  BEDTIME (Patient not taking: Reported on 02/02/2024), Disp: 32.1 g, Rfl: 3   glucose blood (ONETOUCH VERIO) test strip, Use to check blood sugar twice  daily. Dx: E11.9, Disp: 300 each, Rfl: 1   Lancets (ONETOUCH DELICA PLUS LANCET33G) MISC, USE TO CHECK BLOOD SUGAR  TWICE DAILY, Disp: 200 each, Rfl: 3   traMADol  (ULTRAM ) 50 MG tablet, Take 1 tablet (50 mg total) by mouth every 6 (six) hours as needed for chronic back pain, Disp: 90 tablet, Rfl: 2  Past Medical History: Past Medical History:  Diagnosis Date   Abnormal vaginal bleeding    Anxiety    Asthma    Barrett's esophagus    with high grade dysplasia per endoscopy 05/2005 // followed by Dr. Lupita Commander ([facility])   Chronic back pain    Complication of anesthesia    per patient she went in respiratory failure in recovery room in 2017   COPD (chronic obstructive pulmonary disease) (HCC)    Diabetes mellitus without complication (HCC)    type 2   Duodenal ulcer disease    thought to be contributed at least partly by overuse of NSAIDs and salicylates (goody powdr)   Endometrial polyp 03/2003   s/p resection in 03/2003. 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   Migraine    Nevus    Personal history of failed conscious sedation 08/11/2011   Sleep apnea    does not use cpap   Stroke (HCC) 10/2000   Tachycardia    Thyroid  disease    Tobacco abuse 04/2012   quit    Tobacco Use: Social History   Tobacco Use  Smoking  Status Former   Current packs/day: 0.00   Average packs/day: 3.0 packs/day for 50.0 years (150.0 ttl pk-yrs)   Types: Cigarettes   Start date: 09/22/1965   Quit date: 09/23/2015   Years since quitting: 8.3  Smokeless Tobacco Never  Tobacco Comments   Counseling sheet 07-2011     Labs: Review Flowsheet  More data exists      Latest Ref Rng & Units 01/13/2023 02/28/2023 12/08/2023 12/29/2023 12/30/2023  Labs for ITP Cardiac and Pulmonary Rehab  Cholestrol 100 - 199 mg/dL 99  - 64  - -  LDL (calc) 0 - 99 mg/dL 31  - 12  - -  HDL-C >60 mg/dL 45  - 36  - -  Trlycerides 0 - 149 mg/dL 868  - 68  - -  Hemoglobin A1c 4.8 - 5.6 % - 6.9  - - 5.7   TCO2 22 - 32 mmol/L - - - 26  -    Capillary Blood Glucose: Lab Results  Component Value Date   GLUCAP 147 (H) 01/03/2024   GLUCAP 139 (H) 01/03/2024   GLUCAP 133 (H) 01/02/2024   GLUCAP 118 (H) 01/02/2024   GLUCAP 165 (H) 01/02/2024     Exercise Target Goals: Exercise Program Goal: Individual exercise prescription set using results from initial 6 min walk test and THRR while considering  patient's activity barriers and safety.   Exercise Prescription Goal: Initial exercise prescription builds to 30-45 minutes a day of aerobic activity, 2-3 days per week.  Home exercise guidelines will be given to patient during program as part of exercise prescription that the participant will acknowledge.  Activity Barriers & Risk Stratification:  Activity Barriers & Cardiac Risk Stratification - 02/02/24 1339       Activity Barriers & Cardiac Risk Stratification   Activity Barriers Other (comment);Shortness of Breath;Back Problems    Comments Shanyah has issues with a staggered gait. No falls.    Cardiac Risk Stratification Moderate          6 Minute Walk:  6 Minute Walk     Row Name 02/02/24 1524         6 Minute Walk   Phase Initial     Distance 1056 feet     Walk Time 6 minutes     # of Rest Breaks 0     MPH 2     METS 1.7     RPE  11     Perceived Dyspnea  2     VO2 Peak 9.28     Symptoms Yes (comment)     Comments Mild shortness of breath     Resting HR 81 bpm     Resting BP 138/86     Resting Oxygen  Saturation  97 %     Exercise Oxygen  Saturation  during 6 min walk 90 %     Max Ex. HR 108 bpm     Max Ex. BP 154/84     2 Minute Post BP 146/88        Oxygen  Initial Assessment:   Oxygen  Re-Evaluation:   Oxygen  Discharge (Final Oxygen  Re-Evaluation):   Initial Exercise Prescription:  Initial Exercise Prescription - 02/02/24 1600       Date of Initial Exercise RX and Referring Provider   Date 02/02/24    Referring Provider Anner Alm ORN, MD    Expected Discharge Date 04/29/24      Recumbant Bike   Level 1    RPM 30    Watts 15    Minutes 15    METs 1.7      NuStep   Level 1    SPM 63    Minutes 15    METs 1.7      Prescription Details   Frequency (times per week) 3    Duration Progress to 30 minutes of continuous aerobic without signs/symptoms of physical distress      Intensity   THRR 40-80% of Max  Heartrate 61-122    Ratings of Perceived Exertion 11-13    Perceived Dyspnea 0-4      Progression   Progression Continue progressive overload as per policy without signs/symptoms or physical distress.      Resistance Training   Training Prescription Yes    Weight 2 lbs    Reps 10-15          Perform Capillary Blood Glucose checks as needed.  Exercise Prescription Changes:   Exercise Comments:   Exercise Goals and Review:   Exercise Goals     Row Name 02/02/24 1437             Exercise Goals   Increase Physical Activity Yes       Intervention Provide advice, education, support and counseling about physical activity/exercise needs.;Develop an individualized exercise prescription for aerobic and resistive training based on initial evaluation findings, risk stratification, comorbidities and participant's personal goals.       Expected Outcomes Short Term: Attend  rehab on a regular basis to increase amount of physical activity.;Long Term: Exercising regularly at least 3-5 days a week.;Long Term: Add in home exercise to make exercise part of routine and to increase amount of physical activity.       Increase Strength and Stamina Yes       Intervention Provide advice, education, support and counseling about physical activity/exercise needs.;Develop an individualized exercise prescription for aerobic and resistive training based on initial evaluation findings, risk stratification, comorbidities and participant's personal goals.       Expected Outcomes Short Term: Increase workloads from initial exercise prescription for resistance, speed, and METs.;Short Term: Perform resistance training exercises routinely during rehab and add in resistance training at home;Long Term: Improve cardiorespiratory fitness, muscular endurance and strength as measured by increased METs and functional capacity ( )       Able to understand and use rate of perceived exertion (RPE) scale Yes       Intervention Provide education and explanation on how to use RPE scale       Expected Outcomes Short Term: Able to use RPE daily in rehab to express subjective intensity level;Long Term:  Able to use RPE to guide intensity level when exercising independently       Knowledge and understanding of Target Heart Rate Range (THRR) Yes       Intervention Provide education and explanation of THRR including how the numbers were predicted and where they are located for reference       Expected Outcomes Short Term: Able to state/look up THRR;Short Term: Able to use daily as guideline for intensity in rehab;Long Term: Able to use THRR to govern intensity when exercising independently       Able to check pulse independently Yes       Intervention Provide education and demonstration on how to check pulse in carotid and radial arteries.;Review the importance of being able to check your own pulse for safety  during independent exercise       Expected Outcomes Short Term: Able to explain why pulse checking is important during independent exercise;Long Term: Able to check pulse independently and accurately       Understanding of Exercise Prescription Yes       Intervention Provide education, explanation, and written materials on patient's individual exercise prescription       Expected Outcomes Short Term: Able to explain program exercise prescription;Long Term: Able to explain home exercise prescription to exercise independently  Exercise Goals Re-Evaluation :   Discharge Exercise Prescription (Final Exercise Prescription Changes):   Nutrition:  Target Goals: Understanding of nutrition guidelines, daily intake of sodium 1500mg , cholesterol 200mg , calories 30% from fat and 7% or less from saturated fats, daily to have 5 or more servings of fruits and vegetables.  Biometrics:  Pre Biometrics - 02/02/24 1313       Pre Biometrics   Waist Circumference 43.5 inches    Hip Circumference 46.5 inches    Waist to Hip Ratio 0.94 %    Triceps Skinfold 21.5 mm    % Body Fat 42 %    Grip Strength 17 kg    Flexibility --   not performed,  chronic back pain   Single Leg Stand 1.97 seconds           Nutrition Therapy Plan and Nutrition Goals:   Nutrition Assessments:  MEDIFICTS Score Key: >=70 Need to make dietary changes  40-70 Heart Healthy Diet <= 40 Therapeutic Level Cholesterol Diet    Picture Your Plate Scores: <59 Unhealthy dietary pattern with much room for improvement. 41-50 Dietary pattern unlikely to meet recommendations for good health and room for improvement. 51-60 More healthful dietary pattern, with some room for improvement.  >60 Healthy dietary pattern, although there may be some specific behaviors that could be improved.    Nutrition Goals Re-Evaluation:   Nutrition Goals Re-Evaluation:   Nutrition Goals Discharge (Final Nutrition Goals  Re-Evaluation):   Psychosocial: Target Goals: Acknowledge presence or absence of significant depression and/or stress, maximize coping skills, provide positive support system. Participant is able to verbalize types and ability to use techniques and skills needed for reducing stress and depression.  Initial Review & Psychosocial Screening:  Initial Psych Review & Screening - 02/02/24 1415       Initial Review   Current issues with Current Sleep Concerns      Family Dynamics   Good Support System? Yes    Comments Jossie states she doesnn't have any family, but she has good support from friends. She's content with life. She states she has low motivation because of inablity to do housework(vacuuming) due to back pain and shortness of breath.      Barriers   Psychosocial barriers to participate in program There are no identifiable barriers or psychosocial needs.      Screening Interventions   Interventions Encouraged to exercise;Provide feedback about the scores to participant    Expected Outcomes Short Term goal: Identification and review with participant of any Quality of Life or Depression concerns found by scoring the questionnaire.;Long Term goal: The participant improves quality of Life and PHQ9 Scores as seen by post scores and/or verbalization of changes          Quality of Life Scores:  Quality of Life - 02/02/24 1613       Quality of Life   Select Quality of Life      Quality of Life Scores   Health/Function Pre 11.88 %    Socioeconomic Pre 24 %    Psych/Spiritual Pre 20.75 %    Family Pre 30 %    GLOBAL Pre 17.16 %         Scores of 19 and below usually indicate a poorer quality of life in these areas.  A difference of  2-3 points is a clinically meaningful difference.  A difference of 2-3 points in the total score of the Quality of Life Index has been associated with significant improvement in overall  quality of life, self-image, physical symptoms, and general  health in studies assessing change in quality of life.  PHQ-9: Review Flowsheet  More data exists      02/02/2024 04/17/2022 11/18/2021 04/03/2021 02/19/2021  Depression screen PHQ 2/9  Decreased Interest 0 0 2 0 0  Down, Depressed, Hopeless 0 1 2 0 1  PHQ - 2 Score 0 1 4 0 1  Altered sleeping 2 - 2 - -  Tired, decreased energy 2 - 2 - -  Change in appetite 0 - 2 - -  Feeling bad or failure about yourself  0 - 0 - -  Trouble concentrating 1 - 0 - -  Moving slowly or fidgety/restless 0 - 0 - -  Suicidal thoughts 0 - 0 - -  PHQ-9 Score 5 - 10 - -  Difficult doing work/chores Not difficult at all - Not difficult at all - -   Interpretation of Total Score  Total Score Depression Severity:  1-4 = Minimal depression, 5-9 = Mild depression, 10-14 = Moderate depression, 15-19 = Moderately severe depression, 20-27 = Severe depression   Psychosocial Evaluation and Intervention:   Psychosocial Re-Evaluation:   Psychosocial Discharge (Final Psychosocial Re-Evaluation):   Vocational Rehabilitation: Provide vocational rehab assistance to qualifying candidates.   Vocational Rehab Evaluation & Intervention:  Vocational Rehab - 02/02/24 1417       Initial Vocational Rehab Evaluation & Intervention   Assessment shows need for Vocational Rehabilitation No      Vocational Rehab Re-Evaulation   Comments Dekisha is on disability due to chronic back pain and shortness of breath. No VR needs at this time.          Education: Education Goals: Education classes will be provided on a weekly basis, covering required topics. Participant will state understanding/return demonstration of topics presented.     Core Videos: Exercise    Move It!  Clinical staff conducted group or individual video education with verbal and written material and guidebook.  Patient learns the recommended Pritikin exercise program. Exercise with the goal of living a long, healthy life. Some of the health benefits of  exercise include controlled diabetes, healthier blood pressure levels, improved cholesterol levels, improved heart and lung capacity, improved sleep, and better body composition. Everyone should speak with their doctor before starting or changing an exercise routine.  Biomechanical Limitations Clinical staff conducted group or individual video education with verbal and written material and guidebook.  Patient learns how biomechanical limitations can impact exercise and how we can mitigate and possibly overcome limitations to have an impactful and balanced exercise routine.  Body Composition Clinical staff conducted group or individual video education with verbal and written material and guidebook.  Patient learns that body composition (ratio of muscle mass to fat mass) is a key component to assessing overall fitness, rather than body weight alone. Increased fat mass, especially visceral belly fat, can put us  at increased risk for metabolic syndrome, type 2 diabetes, heart disease, and even death. It is recommended to combine diet and exercise (cardiovascular and resistance training) to improve your body composition. Seek guidance from your physician and exercise physiologist before implementing an exercise routine.  Exercise Action Plan Clinical staff conducted group or individual video education with verbal and written material and guidebook.  Patient learns the recommended strategies to achieve and enjoy long-term exercise adherence, including variety, self-motivation, self-efficacy, and positive decision making. Benefits of exercise include fitness, good health, weight management, more energy, better sleep, less stress, and overall  well-being.  Medical   Heart Disease Risk Reduction Clinical staff conducted group or individual video education with verbal and written material and guidebook.  Patient learns our heart is our most vital organ as it circulates oxygen , nutrients, white blood cells, and  hormones throughout the entire body, and carries waste away. Data supports a plant-based eating plan like the Pritikin Program for its effectiveness in slowing progression of and reversing heart disease. The video provides a number of recommendations to address heart disease.   Metabolic Syndrome and Belly Fat  Clinical staff conducted group or individual video education with verbal and written material and guidebook.  Patient learns what metabolic syndrome is, how it leads to heart disease, and how one can reverse it and keep it from coming back. You have metabolic syndrome if you have 3 of the following 5 criteria: abdominal obesity, high blood pressure, high triglycerides, low HDL cholesterol, and high blood sugar.  Hypertension and Heart Disease Clinical staff conducted group or individual video education with verbal and written material and guidebook.  Patient learns that high blood pressure, or hypertension, is very common in the [place] . Hypertension is largely due to excessive salt intake, but other important risk factors include being overweight, physical inactivity, drinking too much alcohol, smoking, and not eating enough potassium from fruits and vegetables. High blood pressure is a leading risk factor for heart attack, stroke, congestive heart failure, dementia, kidney failure, and premature death. Long-term effects of excessive salt intake include stiffening of the arteries and thickening of heart muscle and organ damage. Recommendations include ways to reduce hypertension and the risk of heart disease.  Diseases of Our Time - Focusing on Diabetes Clinical staff conducted group or individual video education with verbal and written material and guidebook.  Patient learns why the best way to stop diseases of our time is prevention, through food and other lifestyle changes. Medicine (such as prescription pills and surgeries) is often only a Band-Aid on the problem, not a long-term  solution. Most common diseases of our time include obesity, type 2 diabetes, hypertension, heart disease, and cancer. The Pritikin Program is recommended and has been proven to help reduce, reverse, and/or prevent the damaging effects of metabolic syndrome.  Nutrition   Overview of the Pritikin Eating Plan  Clinical staff conducted group or individual video education with verbal and written material and guidebook.  Patient learns about the Pritikin Eating Plan for disease risk reduction. The Pritikin Eating Plan emphasizes a wide variety of unrefined, minimally-processed carbohydrates, like fruits, vegetables, whole grains, and legumes. Go, Caution, and Stop food choices are explained. Plant-based and lean animal proteins are emphasized. Rationale provided for low sodium intake for blood pressure control, low added sugars for blood sugar stabilization, and low added fats and oils for coronary artery disease risk reduction and weight management.  Calorie Density  Clinical staff conducted group or individual video education with verbal and written material and guidebook.  Patient learns about calorie density and how it impacts the Pritikin Eating Plan. Knowing the characteristics of the food you choose will help you decide whether those foods will lead to weight gain or weight loss, and whether you want to consume more or less of them. Weight loss is usually a side effect of the Pritikin Eating Plan because of its focus on low calorie-dense foods.  Label Reading  Clinical staff conducted group or individual video education with verbal and written material and guidebook.  Patient learns about the Pritikin recommended label  reading guidelines and corresponding recommendations regarding calorie density, added sugars, sodium content, and whole grains.  Dining Out - Part 1  Clinical staff conducted group or individual video education with verbal and written material and guidebook.  Patient learns that  restaurant meals can be sabotaging because they can be so high in calories, fat, sodium, and/or sugar. Patient learns recommended strategies on how to positively address this and avoid unhealthy pitfalls.  Facts on Fats  Clinical staff conducted group or individual video education with verbal and written material and guidebook.  Patient learns that lifestyle modifications can be just as effective, if not more so, as many medications for lowering your risk of heart disease. A Pritikin lifestyle can help to reduce your risk of inflammation and atherosclerosis (cholesterol build-up, or plaque, in the artery walls). Lifestyle interventions such as dietary choices and physical activity address the cause of atherosclerosis. A review of the types of fats and their impact on blood cholesterol levels, along with dietary recommendations to reduce fat intake is also included.  Nutrition Action Plan  Clinical staff conducted group or individual video education with verbal and written material and guidebook.  Patient learns how to incorporate Pritikin recommendations into their lifestyle. Recommendations include planning and keeping personal health goals in mind as an important part of their success.  Healthy Mind-Set    Healthy Minds, Bodies, Hearts  Clinical staff conducted group or individual video education with verbal and written material and guidebook.  Patient learns how to identify when they are stressed. Video will discuss the impact of that stress, as well as the many benefits of stress management. Patient will also be introduced to stress management techniques. The way we think, act, and feel has an impact on our hearts.  How Our Thoughts Can Heal Our Hearts  Clinical staff conducted group or individual video education with verbal and written material and guidebook.  Patient learns that negative thoughts can cause depression and anxiety. This can result in negative lifestyle behavior and serious  health problems. Cognitive behavioral therapy is an effective method to help control our thoughts in order to change and improve our emotional outlook.  Additional Videos:  Exercise    Improving Performance  Clinical staff conducted group or individual video education with verbal and written material and guidebook.  Patient learns to use a non-linear approach by alternating intensity levels and lengths of time spent exercising to help burn more calories and lose more body fat. Cardiovascular exercise helps improve heart health, metabolism, hormonal balance, blood sugar control, and recovery from fatigue. Resistance training improves strength, endurance, balance, coordination, reaction time, metabolism, and muscle mass. Flexibility exercise improves circulation, posture, and balance. Seek guidance from your physician and exercise physiologist before implementing an exercise routine and learn your capabilities and proper form for all exercise.  Introduction to Yoga  Clinical staff conducted group or individual video education with verbal and written material and guidebook.  Patient learns about yoga, a discipline of the coming together of mind, breath, and body. The benefits of yoga include improved flexibility, improved range of motion, better posture and core strength, increased lung function, weight loss, and positive self-image. Yoga's heart health benefits include lowered blood pressure, healthier heart rate, decreased cholesterol and triglyceride levels, improved immune function, and reduced stress. Seek guidance from your physician and exercise physiologist before implementing an exercise routine and learn your capabilities and proper form for all exercise.  Medical   Aging: Enhancing Your Quality of Life  Clinical staff  conducted group or individual video education with verbal and written material and guidebook.  Patient learns key strategies and recommendations to stay in good physical health  and enhance quality of life, such as prevention strategies, having an advocate, securing a Health Care Proxy and Power of Attorney, and keeping a list of medications and system for tracking them. It also discusses how to avoid risk for bone loss.  Biology of Weight Control  Clinical staff conducted group or individual video education with verbal and written material and guidebook.  Patient learns that weight gain occurs because we consume more calories than we burn (eating more, moving less). Even if your body weight is normal, you may have higher ratios of fat compared to muscle mass. Too much body fat puts you at increased risk for cardiovascular disease, heart attack, stroke, type 2 diabetes, and obesity-related cancers. In addition to exercise, following the Pritikin Eating Plan can help reduce your risk.  Decoding Lab Results  Clinical staff conducted group or individual video education with verbal and written material and guidebook.  Patient learns that lab test reflects one measurement whose values change over time and are influenced by many factors, including medication, stress, sleep, exercise, food, hydration, pre-existing medical conditions, and more. It is recommended to use the knowledge from this video to become more involved with your lab results and evaluate your numbers to speak with your doctor.   Diseases of Our Time - Overview  Clinical staff conducted group or individual video education with verbal and written material and guidebook.  Patient learns that according to the CDC, 50% to 70% of chronic diseases (such as obesity, type 2 diabetes, elevated lipids, hypertension, and heart disease) are avoidable through lifestyle improvements including healthier food choices, listening to satiety cues, and increased physical activity.  Sleep Disorders Clinical staff conducted group or individual video education with verbal and written material and guidebook.  Patient learns how good  quality and duration of sleep are important to overall health and well-being. Patient also learns about sleep disorders and how they impact health along with recommendations to address them, including discussing with a physician.  Nutrition  Dining Out - Part 2 Clinical staff conducted group or individual video education with verbal and written material and guidebook.  Patient learns how to plan ahead and communicate in order to maximize their dining experience in a healthy and nutritious manner. Included are recommended food choices based on the type of restaurant the patient is visiting.   Fueling a Banker conducted group or individual video education with verbal and written material and guidebook.  There is a strong connection between our food choices and our health. Diseases like obesity and type 2 diabetes are very prevalent and are in large-part due to lifestyle choices. The Pritikin Eating Plan provides plenty of food and hunger-curbing satisfaction. It is easy to follow, affordable, and helps reduce health risks.  Menu Workshop  Clinical staff conducted group or individual video education with verbal and written material and guidebook.  Patient learns that restaurant meals can sabotage health goals because they are often packed with calories, fat, sodium, and sugar. Recommendations include strategies to plan ahead and to communicate with the manager, chef, or server to help order a healthier meal.  Planning Your Eating Strategy  Clinical staff conducted group or individual video education with verbal and written material and guidebook.  Patient learns about the Pritikin Eating Plan and its benefit of reducing the risk of disease.  The Pritikin Eating Plan does not focus on calories. Instead, it emphasizes high-quality, nutrient-rich foods. By knowing the characteristics of the foods, we choose, we can determine their calorie density and make informed  decisions.  Targeting Your Nutrition Priorities  Clinical staff conducted group or individual video education with verbal and written material and guidebook.  Patient learns that lifestyle habits have a tremendous impact on disease risk and progression. This video provides eating and physical activity recommendations based on your personal health goals, such as reducing LDL cholesterol, losing weight, preventing or controlling type 2 diabetes, and reducing high blood pressure.  Vitamins and Minerals  Clinical staff conducted group or individual video education with verbal and written material and guidebook.  Patient learns different ways to obtain key vitamins and minerals, including through a recommended healthy diet. It is important to discuss all supplements you take with your doctor.   Healthy Mind-Set    Smoking Cessation  Clinical staff conducted group or individual video education with verbal and written material and guidebook.  Patient learns that cigarette smoking and tobacco addiction pose a serious health risk which affects millions of people. Stopping smoking will significantly reduce the risk of heart disease, lung disease, and many forms of cancer. Recommended strategies for quitting are covered, including working with your doctor to develop a successful plan.  Culinary   Becoming a Set designer conducted group or individual video education with verbal and written material and guidebook.  Patient learns that cooking at home can be healthy, cost-effective, quick, and puts them in control. Keys to cooking healthy recipes will include looking at your recipe, assessing your equipment needs, planning ahead, making it simple, choosing cost-effective seasonal ingredients, and limiting the use of added fats, salts, and sugars.  Cooking - Breakfast and Snacks  Clinical staff conducted group or individual video education with verbal and written material and guidebook.   Patient learns how important breakfast is to satiety and nutrition through the entire day. Recommendations include key foods to eat during breakfast to help stabilize blood sugar levels and to prevent overeating at meals later in the day. Planning ahead is also a key component.  Cooking - Educational psychologist conducted group or individual video education with verbal and written material and guidebook.  Patient learns eating strategies to improve overall health, including an approach to cook more at home. Recommendations include thinking of animal protein as a side on your plate rather than center stage and focusing instead on lower calorie dense options like vegetables, fruits, whole grains, and plant-based proteins, such as beans. Making sauces in large quantities to freeze for later and leaving the skin on your vegetables are also recommended to maximize your experience.  Cooking - Healthy Salads and Dressing Clinical staff conducted group or individual video education with verbal and written material and guidebook.  Patient learns that vegetables, fruits, whole grains, and legumes are the foundations of the Pritikin Eating Plan. Recommendations include how to incorporate each of these in flavorful and healthy salads, and how to create homemade salad dressings. Proper handling of ingredients is also covered. Cooking - Soups and [employer] - Soups and Desserts Clinical staff conducted group or individual video education with verbal and written material and guidebook.  Patient learns that Pritikin soups and desserts make for easy, nutritious, and delicious snacks and meal components that are low in sodium, fat, sugar, and calorie density, while high in vitamins, minerals, and filling fiber. Recommendations  include simple and healthy ideas for soups and desserts.   Overview     The Pritikin Solution Program Overview Clinical staff conducted group or individual video education with  verbal and written material and guidebook.  Patient learns that the results of the Pritikin Program have been documented in more than 100 articles published in peer-reviewed journals, and the benefits include reducing risk factors for (and, in some cases, even reversing) high cholesterol, high blood pressure, type 2 diabetes, obesity, and more! An overview of the three key pillars of the Pritikin Program will be covered: eating well, doing regular exercise, and having a healthy mind-set.  WORKSHOPS  Exercise: Exercise Basics: Building Your Action Plan Clinical staff led group instruction and group discussion with PowerPoint presentation and patient guidebook. To enhance the learning environment the use of posters, models and videos may be added. At the conclusion of this workshop, patients will comprehend the difference between physical activity and exercise, as well as the benefits of incorporating both, into their routine. Patients will understand the FITT (Frequency, Intensity, Time, and Type) principle and how to use it to build an exercise action plan. In addition, safety concerns and other considerations for exercise and cardiac rehab will be addressed by the presenter. The purpose of this lesson is to promote a comprehensive and effective weekly exercise routine in order to improve patients' overall level of fitness.   Managing Heart Disease: Your Path to a Healthier Heart Clinical staff led group instruction and group discussion with PowerPoint presentation and patient guidebook. To enhance the learning environment the use of posters, models and videos may be added.At the conclusion of this workshop, patients will understand the anatomy and physiology of the heart. Additionally, they will understand how Pritikin's three pillars impact the risk factors, the progression, and the management of heart disease.  The purpose of this lesson is to provide a high-level overview of the heart, heart  disease, and how the Pritikin lifestyle positively impacts risk factors.  Exercise Biomechanics Clinical staff led group instruction and group discussion with PowerPoint presentation and patient guidebook. To enhance the learning environment the use of posters, models and videos may be added. Patients will learn how the structural parts of their bodies function and how these functions impact their daily activities, movement, and exercise. Patients will learn how to promote a neutral spine, learn how to manage pain, and identify ways to improve their physical movement in order to promote healthy living. The purpose of this lesson is to expose patients to common physical limitations that impact physical activity. Participants will learn practical ways to adapt and manage aches and pains, and to minimize their effect on regular exercise. Patients will learn how to maintain good posture while sitting, walking, and lifting.  Balance Training and Fall Prevention  Clinical staff led group instruction and group discussion with PowerPoint presentation and patient guidebook. To enhance the learning environment the use of posters, models and videos may be added. At the conclusion of this workshop, patients will understand the importance of their sensorimotor skills (vision, proprioception, and the vestibular system) in maintaining their ability to balance as they age. Patients will apply a variety of balancing exercises that are appropriate for their current level of function. Patients will understand the common causes for poor balance, possible solutions to these problems, and ways to modify their physical environment in order to minimize their fall risk. The purpose of this lesson is to teach patients about the importance of maintaining balance as they  age and ways to minimize their risk of falling.  WORKSHOPS   Nutrition:  Fueling a Ship broker led group instruction and group  discussion with PowerPoint presentation and patient guidebook. To enhance the learning environment the use of posters, models and videos may be added. Patients will review the foundational principles of the Pritikin Eating Plan and understand what constitutes a serving size in each of the food groups. Patients will also learn Pritikin-friendly foods that are better choices when away from home and review make-ahead meal and snack options. Calorie density will be reviewed and applied to three nutrition priorities: weight maintenance, weight loss, and weight gain. The purpose of this lesson is to reinforce (in a group setting) the key concepts around what patients are recommended to eat and how to apply these guidelines when away from home by planning and selecting Pritikin-friendly options. Patients will understand how calorie density may be adjusted for different weight management goals.  Mindful Eating  Clinical staff led group instruction and group discussion with PowerPoint presentation and patient guidebook. To enhance the learning environment the use of posters, models and videos may be added. Patients will briefly review the concepts of the Pritikin Eating Plan and the importance of low-calorie dense foods. The concept of mindful eating will be introduced as well as the importance of paying attention to internal hunger signals. Triggers for non-hunger eating and techniques for dealing with triggers will be explored. The purpose of this lesson is to provide patients with the opportunity to review the basic principles of the Pritikin Eating Plan, discuss the value of eating mindfully and how to measure internal cues of hunger and fullness using the Hunger Scale. Patients will also discuss reasons for non-hunger eating and learn strategies to use for controlling emotional eating.  Targeting Your Nutrition Priorities Clinical staff led group instruction and group discussion with PowerPoint presentation and  patient guidebook. To enhance the learning environment the use of posters, models and videos may be added. Patients will learn how to determine their genetic susceptibility to disease by reviewing their family history. Patients will gain insight into the importance of diet as part of an overall healthy lifestyle in mitigating the impact of genetics and other environmental insults. The purpose of this lesson is to provide patients with the opportunity to assess their personal nutrition priorities by looking at their family history, their own health history and current risk factors. Patients will also be able to discuss ways of prioritizing and modifying the Pritikin Eating Plan for their highest risk areas  Menu  Clinical staff led group instruction and group discussion with PowerPoint presentation and patient guidebook. To enhance the learning environment the use of posters, models and videos may be added. Using menus brought in from [employer], or printed from [employer], patients will apply the Pritikin dining out guidelines that were presented in the [employer] video. Patients will also be able to practice these guidelines in a variety of provided scenarios. The purpose of this lesson is to provide patients with the opportunity to practice hands-on learning of the Pritikin Dining Out guidelines with actual menus and practice scenarios.  Label Reading Clinical staff led group instruction and group discussion with PowerPoint presentation and patient guidebook. To enhance the learning environment the use of posters, models and videos may be added. Patients will review and discuss the Pritikin label reading guidelines presented in Pritikin's Label Reading Educational series video. Using fool labels brought in from local grocery  stores and markets, patients will apply the label reading guidelines and determine if the packaged food meet the Pritikin guidelines. The purpose of this  lesson is to provide patients with the opportunity to review, discuss, and practice hands-on learning of the Pritikin Label Reading guidelines with actual packaged food labels. Cooking School  Pritikin's [employer] are designed to teach patients ways to prepare quick, simple, and affordable recipes at home. The importance of nutrition's role in chronic disease risk reduction is reflected in its emphasis in the overall Pritikin program. By learning how to prepare essential core Pritikin Eating Plan recipes, patients will increase control over what they eat; be able to customize the flavor of foods without the use of added salt, sugar, or fat; and improve the quality of the food they consume. By learning a set of core recipes which are easily assembled, quickly prepared, and affordable, patients are more likely to prepare more healthy foods at home. These workshops focus on convenient breakfasts, simple entres, side dishes, and desserts which can be prepared with minimal effort and are consistent with nutrition recommendations for cardiovascular risk reduction. Cooking [employer] are taught by a Armed forces logistics/support/administrative officer (RD) who has been trained by the [employer]. The chef or RD has a clear understanding of the importance of minimizing - if not completely eliminating - added fat, sugar, and sodium in recipes. Throughout the series of Cooking School Workshop sessions, patients will learn about healthy ingredients and efficient methods of cooking to build confidence in their capability to prepare    Cooking School weekly topics:  Adding Flavor- Sodium-Free  Fast and Healthy Breakfasts  Powerhouse Plant-Based Proteins  Satisfying Salads and Dressings  Simple Sides and Sauces  International Cuisine-Spotlight on the [employer] Zones  Delicious Desserts  Savory Soups  [employer] - Meals in a Astronomer Appetizers and Snacks  Comforting Weekend Breakfasts  One-Pot  Wonders   Fast Evening Meals  Landscape architect Your Pritikin Plate  WORKSHOPS   Healthy Mindset (Psychosocial):  Focused Goals, Sustainable Changes Clinical staff led group instruction and group discussion with PowerPoint presentation and patient guidebook. To enhance the learning environment the use of posters, models and videos may be added. Patients will be able to apply effective goal setting strategies to establish at least one personal goal, and then take consistent, meaningful action toward that goal. They will learn to identify common barriers to achieving personal goals and develop strategies to overcome them. Patients will also gain an understanding of how our mind-set can impact our ability to achieve goals and the importance of cultivating a positive and growth-oriented mind-set. The purpose of this lesson is to provide patients with a deeper understanding of how to set and achieve personal goals, as well as the tools and strategies needed to overcome common obstacles which may arise along the way.  From Head to Heart: The Power of a Healthy Outlook  Clinical staff led group instruction and group discussion with PowerPoint presentation and patient guidebook. To enhance the learning environment the use of posters, models and videos may be added. Patients will be able to recognize and describe the impact of emotions and mood on physical health. They will discover the importance of self-care and explore self-care practices which may work for them. Patients will also learn how to utilize the 4 C's to cultivate a healthier outlook and better manage stress and challenges. The purpose of this lesson is to demonstrate to patients  how a healthy outlook is an essential part of maintaining good health, especially as they continue their cardiac rehab journey.  Healthy Sleep for a Healthy Heart Clinical staff led group instruction and group discussion with PowerPoint presentation and  patient guidebook. To enhance the learning environment the use of posters, models and videos may be added. At the conclusion of this workshop, patients will be able to demonstrate knowledge of the importance of sleep to overall health, well-being, and quality of life. They will understand the symptoms of, and treatments for, common sleep disorders. Patients will also be able to identify daytime and nighttime behaviors which impact sleep, and they will be able to apply these tools to help manage sleep-related challenges. The purpose of this lesson is to provide patients with a general overview of sleep and outline the importance of quality sleep. Patients will learn about a few of the most common sleep disorders. Patients will also be introduced to the concept of "sleep hygiene," and discover ways to self-manage certain sleeping problems through simple daily behavior changes. Finally, the workshop will motivate patients by clarifying the links between quality sleep and their goals of heart-healthy living.   Recognizing and Reducing Stress Clinical staff led group instruction and group discussion with PowerPoint presentation and patient guidebook. To enhance the learning environment the use of posters, models and videos may be added. At the conclusion of this workshop, patients will be able to understand the types of stress reactions, differentiate between acute and chronic stress, and recognize the impact that chronic stress has on their health. They will also be able to apply different coping mechanisms, such as reframing negative self-talk. Patients will have the opportunity to practice a variety of stress management techniques, such as deep abdominal breathing, progressive muscle relaxation, and/or guided imagery.  The purpose of this lesson is to educate patients on the role of stress in their lives and to provide healthy techniques for coping with it.  Learning Barriers/Preferences:  Learning  Barriers/Preferences - 02/02/24 1417       Learning Barriers/Preferences   Learning Barriers None    Learning Preferences Computer/Internet;Group Instruction;Individual Instruction;Pictoral;Skilled Demonstration;Verbal Instruction;Video;Written Material;Audio          Education Topics:  Knowledge Questionnaire Score:  Knowledge Questionnaire Score - 02/02/24 1418       Knowledge Questionnaire Score   Pre Score 24/24          Core Components/Risk Factors/Patient Goals at Admission:  Personal Goals and Risk Factors at Admission - 02/02/24 1419       Core Components/Risk Factors/Patient Goals on Admission   Lipids Yes    Intervention Provide education and support for participant on nutrition & aerobic/resistive exercise along with prescribed medications to achieve LDL 70mg , HDL >40mg .    Expected Outcomes Short Term: Participant states understanding of desired cholesterol values and is compliant with medications prescribed. Participant is following exercise prescription and nutrition guidelines.;Long Term: Cholesterol controlled with medications as prescribed, with individualized exercise RX and with personalized nutrition plan. Value goals: LDL < 70mg , HDL > 40 mg.          Core Components/Risk Factors/Patient Goals Review:    Core Components/Risk Factors/Patient Goals at Discharge (Final Review):    ITP Comments:  ITP Comments     Row Name 02/02/24 1315           ITP Comments Wilbert Bihari, MD: Medical Director. Introduction to the [facility]/Intensive Cardiac Rehab. Initial orientaion packet reviewed with the patient.  Comments: Ayaan attended orientation for the cardiac rehabilitation program on  02/02/2024  to perform initial intake and exercise walk test. She was introduced to the [employer] education and orientation packet was reviewed. Completed 6-minute walk test, measurements, initial ITP, and exercise prescription. Vital  signs stable. Telemetry-normal sinus rhythm, asymptomatic.   Service time was from 1310 to 1536. Arnoldo CHRISTELLA Gal, MS, ACSM CEP 02/02/2024 [phone]

## 2024-02-02 NOTE — Progress Notes (Signed)
 Cardiac Rehab Medication Review   Does the patient  feel that his/her medications are working for him/her?  Grace [name] feels she needs something stronger than Tramadol  for back pain.  Has the patient been experiencing any side effects to the medications prescribed?  no  Does the patient measure his/her own blood pressure or blood glucose at home?  Checks BP once daily. Does not check blood sugar.   Does the patient have any problems obtaining medications due to transportation or finances?   no  Understanding of regimen: excellent Understanding of indications: excellent Potential of compliance: excellent    Comments: Reviewed list with patient.    [name] 02/02/2024 2:24 PM

## 2024-02-03 ENCOUNTER — Other Ambulatory Visit: Payer: Self-pay

## 2024-02-08 ENCOUNTER — Ambulatory Visit (INDEPENDENT_AMBULATORY_CARE_PROVIDER_SITE_OTHER): Admitting: Nurse Practitioner

## 2024-02-08 ENCOUNTER — Encounter: Payer: Self-pay | Admitting: Nurse Practitioner

## 2024-02-08 ENCOUNTER — Ambulatory Visit: Payer: Self-pay | Admitting: Nurse Practitioner

## 2024-02-08 VITALS — BP 116/70 | HR 80 | Temp 96.5°F | Ht 62.5 in | Wt 161.4 lb

## 2024-02-08 DIAGNOSIS — I10 Essential (primary) hypertension: Secondary | ICD-10-CM

## 2024-02-08 DIAGNOSIS — Z8673 Personal history of transient ischemic attack (TIA), and cerebral infarction without residual deficits: Secondary | ICD-10-CM

## 2024-02-08 DIAGNOSIS — G4733 Obstructive sleep apnea (adult) (pediatric): Secondary | ICD-10-CM | POA: Diagnosis not present

## 2024-02-08 DIAGNOSIS — Z006 Encounter for examination for normal comparison and control in clinical research program: Secondary | ICD-10-CM

## 2024-02-08 DIAGNOSIS — E119 Type 2 diabetes mellitus without complications: Secondary | ICD-10-CM | POA: Diagnosis not present

## 2024-02-08 DIAGNOSIS — E663 Overweight: Secondary | ICD-10-CM | POA: Insufficient documentation

## 2024-02-08 DIAGNOSIS — L659 Nonscarring hair loss, unspecified: Secondary | ICD-10-CM | POA: Insufficient documentation

## 2024-02-08 DIAGNOSIS — I2510 Atherosclerotic heart disease of native coronary artery without angina pectoris: Secondary | ICD-10-CM

## 2024-02-08 DIAGNOSIS — M545 Low back pain, unspecified: Secondary | ICD-10-CM

## 2024-02-08 DIAGNOSIS — Z7985 Long-term (current) use of injectable non-insulin antidiabetic drugs: Secondary | ICD-10-CM | POA: Insufficient documentation

## 2024-02-08 DIAGNOSIS — E785 Hyperlipidemia, unspecified: Secondary | ICD-10-CM

## 2024-02-08 DIAGNOSIS — J449 Chronic obstructive pulmonary disease, unspecified: Secondary | ICD-10-CM

## 2024-02-08 DIAGNOSIS — E038 Other specified hypothyroidism: Secondary | ICD-10-CM

## 2024-02-08 DIAGNOSIS — G8929 Other chronic pain: Secondary | ICD-10-CM

## 2024-02-08 DIAGNOSIS — G2581 Restless legs syndrome: Secondary | ICD-10-CM | POA: Insufficient documentation

## 2024-02-08 DIAGNOSIS — G43009 Migraine without aura, not intractable, without status migrainosus: Secondary | ICD-10-CM

## 2024-02-08 LAB — LIPID PANEL
Cholesterol: 101 mg/dL (ref 0–200)
HDL: 60 mg/dL (ref >39.00)
LDL Cholesterol: 20 mg/dL (ref 0–99)
NonHDL: 40.77
Total CHOL/HDL Ratio: 2
Triglycerides: 103 mg/dL (ref 0.0–149.0)
VLDL: 20.6 mg/dL (ref 0.0–40.0)

## 2024-02-08 LAB — CBC WITH DIFFERENTIAL/PLATELET
Basophils Absolute: 0 K/uL (ref 0.0–0.1)
Basophils Relative: 0.8 % (ref 0.0–3.0)
Eosinophils Absolute: 0.1 K/uL (ref 0.0–0.7)
Eosinophils Relative: 1.9 % (ref 0.0–5.0)
HCT: 43.1 % (ref 36.0–46.0)
Hemoglobin: 13.6 g/dL (ref 12.0–15.0)
Lymphocytes Relative: 22.1 % (ref 12.0–46.0)
Lymphs Abs: 1.1 K/uL (ref 0.7–4.0)
MCHC: 31.7 g/dL (ref 30.0–36.0)
MCV: 93.5 fl (ref 78.0–100.0)
Monocytes Absolute: 0.5 K/uL (ref 0.1–1.0)
Monocytes Relative: 10.7 % (ref 3.0–12.0)
Neutro Abs: 3.3 K/uL (ref 1.4–7.7)
Neutrophils Relative %: 64.5 % (ref 43.0–77.0)
Platelets: 230 K/uL (ref 150.0–400.0)
RBC: 4.61 Mil/uL (ref 3.87–5.11)
RDW: 16.6 % — ABNORMAL HIGH (ref 11.5–15.5)
WBC: 5.1 K/uL (ref 4.0–10.5)

## 2024-02-08 LAB — MICROALBUMIN / CREATININE URINE RATIO
Creatinine,U: 195.5 mg/dL
Microalb Creat Ratio: 38.5 mg/g — ABNORMAL HIGH (ref 0.0–30.0)
Microalb, Ur: 7.5 mg/dL — ABNORMAL HIGH (ref 0.0–1.9)

## 2024-02-08 LAB — VITAMIN D 25 HYDROXY (VIT D DEFICIENCY, FRACTURES): VITD: 36.52 ng/mL (ref 30.00–100.00)

## 2024-02-08 LAB — COMPREHENSIVE METABOLIC PANEL WITH GFR
ALT: 46 U/L — ABNORMAL HIGH (ref 0–35)
AST: 47 U/L — ABNORMAL HIGH (ref 0–37)
Albumin: 3.9 g/dL (ref 3.5–5.2)
Alkaline Phosphatase: 62 U/L (ref 39–117)
BUN: 14 mg/dL (ref 6–23)
CO2: 28 meq/L (ref 19–32)
Calcium: 9.5 mg/dL (ref 8.4–10.5)
Chloride: 103 meq/L (ref 96–112)
Creatinine, Ser: 0.78 mg/dL (ref 0.40–1.20)
GFR: 77.86 mL/min (ref >60.00)
Glucose, Bld: 109 mg/dL — ABNORMAL HIGH (ref 70–99)
Potassium: 4.3 meq/L (ref 3.5–5.1)
Sodium: 138 meq/L (ref 135–145)
Total Bilirubin: 0.3 mg/dL (ref 0.2–1.2)
Total Protein: 6.8 g/dL (ref 6.0–8.3)

## 2024-02-08 LAB — FERRITIN: Ferritin: 27.6 ng/mL (ref 10.0–291.0)

## 2024-02-08 LAB — T4, FREE: Free T4: 2.06 ng/dL — ABNORMAL HIGH (ref 0.60–1.60)

## 2024-02-08 LAB — VITAMIN B12: Vitamin B-12: 772 pg/mL (ref 211–911)

## 2024-02-08 LAB — IRON: Iron: 54 ug/dL (ref 42–145)

## 2024-02-08 LAB — TSH: TSH: 0.07 u[IU]/mL — ABNORMAL LOW (ref 0.35–5.50)

## 2024-02-08 LAB — T3, FREE: T3, Free: 4.3 pg/mL — ABNORMAL HIGH (ref 2.3–4.2)

## 2024-02-08 MED ORDER — DAPAGLIFLOZIN PROPANEDIOL 5 MG PO TABS: 5.0000 mg | ORAL_TABLET | Freq: Every day | ORAL | 0 refills | Status: DC

## 2024-02-08 MED ORDER — LEVOTHYROXINE SODIUM 175 MCG PO TABS: 175.0000 ug | ORAL_TABLET | Freq: Every day | ORAL | 0 refills | Status: DC

## 2024-02-08 MED ORDER — COVID-19 MRNA VAC-TRIS(PFIZER) 30 MCG/0.3ML IM SUSY: 0.3000 mL | PREFILLED_SYRINGE | Freq: Once | INTRAMUSCULAR | 0 refills | Status: AC

## 2024-02-08 NOTE — Assessment & Plan Note (Signed)
 She is on levothyroxine  200 mcg daily but reports symptoms like hair loss and thin fingernails despite normal thyroid  tests. Plan to order TSH, T4, T3 to reassess thyroid  function.

## 2024-02-08 NOTE — Assessment & Plan Note (Signed)
 Chronic, stable. Continue bisoprolol  10mg  daily.

## 2024-02-08 NOTE — Assessment & Plan Note (Signed)
 Her COPD is managed with nebulizer treatments, but she is not using the Breztri  inhaler. Reports exertional dyspnea and is under ongoing pulmonology follow-up. Continue nebulizer treatments twice daily and discuss inhaler use with the pulmonologist.

## 2024-02-08 NOTE — Assessment & Plan Note (Signed)
 She has severe sleep apnea with 28-30 events per hour and lacks necessary CPAP tubing and mask. Scheduled for pulmonology follow-up. Continue follow-up with pulmonology for CPAP management.

## 2024-02-08 NOTE — Progress Notes (Signed)
 New Patient Visit  BP 116/70 (BP Location: Left Arm, Patient Position: Sitting, Cuff Size: Normal)   Pulse 80   Temp (!) 96.5 F (35.8 C)   Ht 5' 2.5 (1.588 m)   Wt 161 lb 6.4 oz (73.2 kg)   LMP 02/27/2014   SpO2 96%   BMI 29.05 kg/m    Subjective:    Patient ID: Grace [name], female    DOB: 04-18-1955, 68 y.o.   MRN: [id number]  CC: Chief Complaint  Patient presents with   Establish Care    NP. Est. Care, Covid Rx, concerns with hair loss    HPI: Grace [name] is a 68 y.o. female presents for new patient visit to establish care.  Introduced to publishing rights manager role and practice setting.  All questions answered.  Discussed provider/patient relationship and expectations.  Discussed the use of AI scribe software for clinical note transcription with the patient, who gave verbal consent to proceed.  History of Present Illness   Grace [name] is a 68 year old female with hypothyroidism and coronary artery disease who presents to establish care with hair loss and back pain.  She experiences significant hair loss over the past couple of years, with an increase in the last few months. The hair loss is generalized but more pronounced on the scalp, with hair coming out in handfuls. Her fingernails are very thin. She takes levothyroxine  200 mcg daily, with normal thyroid  function tests, and vitamins for hair, skin, and nails.  She experiences severe back pain that is diffuse and does not radiate to her legs. The pain worsens with movement, making household tasks and prolonged standing difficult. She takes tramadol  50 mg every six hours as needed, but finds it ineffective. She also takes pregabalin  50 mg for restless leg syndrome and Requip  0.5 mg at bedtime.  She has coronary artery disease with previous stent placement and takes bisoprolol  10 mg and Plavix  75 mg daily. She experiences chest pain less frequently since the stent placement but had an intense episode three nights  ago. Her father had two heart attacks. She also experienced a GI bleed after taking aspirin  and plavix , requiring 4 units of blood. She has since stopped aspirin  and is just taking plavix  along with following with cardiology and GI.   She uses a nebulizer twice daily and albuterol  as needed for COPD. She quit smoking after a hospitalization for respiratory failure. She has a CPAP machine but lacks supplies to use it regularly. She had a repeat sleep study and has follow-up with pulmonology.     Depression and Anxiety Screen done:    02/08/2024    1:16 PM 02/02/2024    1:34 PM 04/17/2022   12:01 PM 11/18/2021    1:33 PM 04/03/2021   11:44 AM  Depression screen PHQ 2/9  Decreased Interest 0 0 0 2 0  Down, Depressed, Hopeless 0 0 1 2 0  PHQ - 2 Score 0 0 1 4 0  Altered sleeping 2 2  2    Tired, decreased energy 2 2  2    Change in appetite 0 0  2   Feeling bad or failure about yourself  0 0  0   Trouble concentrating 0 1  0   Moving slowly or fidgety/restless 0 0  0   Suicidal thoughts 0 0  0   PHQ-9 Score 4 5  10    Difficult doing work/chores Very difficult Not difficult at all  Not difficult at  all       02/08/2024    1:16 PM  GAD 7 : Generalized Anxiety Score  Nervous, Anxious, on Edge 0  Control/stop worrying 0  Worry too much - different things 0  Trouble relaxing 2  Restless 1  Easily annoyed or irritable 0  Afraid - awful might happen 0  Total GAD 7 Score 3  Anxiety Difficulty Somewhat difficult   Past Medical History:  Diagnosis Date   Abnormal vaginal bleeding    Anxiety    Arthritis    Asthma    Barrett's esophagus    with high grade dysplasia per endoscopy 05/2005 // followed by Dr. Lupita Commander ([facility])   Blood transfusion without reported diagnosis    Cataract 2019   Not sure, due to inhaler use   Chronic back pain    Complication of anesthesia    per patient she went in respiratory failure in recovery room in 2017   COPD (chronic obstructive pulmonary  disease) (HCC)    Diabetes mellitus without complication (HCC)    type 2   Duodenal ulcer disease    thought to be contributed at least partly by overuse of NSAIDs and salicylates (goody powdr)   Emphysema of lung [facility])    Endometrial polyp 03/2003   s/p resection in 03/2003. Path showing submucosal leiomyoma and benign  proliferative type endometrium   Family history of premature CAD 05/19/2018   Fibroid uterus    s/p myomectomy x 2   GERD (gastroesophageal reflux disease) 1990   Hiatal hernia    History of CVA (cerebrovascular accident) 10/2000   History of pineal cyst    11 mm cystic mass noted in the pineal gland per MRI in 2001 - most consitent with simple pineal cyst   HSV infection    Hyperlipidemia    Hypertension    Hypothyroidism    with hx of multinodular goiter (noted on neck US  in 11/2002 - showing diffuse nodularity and inhomogenous texture diffusely BL)   Internal hemorrhoids    grade 2 per colonoscopy in 09/2006 - repeat colonoscopy rec in 5-10 years.   Migraine    Nevus    Personal history of failed conscious sedation 08/11/2011   Sleep apnea    does not use cpap   Stroke (HCC) 10/2000   Tachycardia    Thyroid  disease    Tobacco abuse 04/2012   quit    Past Surgical History:  Procedure Laterality Date   ADENOIDECTOMY     APPENDECTOMY  2017   BALLOON DILATION N/A 06/14/2020   Procedure: BALLOON DILATION;  Surgeon: Wilhelmenia Aloha Raddle., MD;  Location: [facility] ENDOSCOPY;  Service: Gastroenterology;  Laterality: N/A;   BIOPSY  02/15/2020   Procedure: BIOPSY;  Surgeon: Burnette Fallow, MD;  Location: [facility] ENDOSCOPY;  Service: Endoscopy;;   BIOPSY  06/14/2020   Procedure: BIOPSY;  Surgeon: Wilhelmenia Aloha Raddle., MD;  Location: [facility] ENDOSCOPY;  Service: Gastroenterology;;   BIOPSY  03/02/2023   Procedure: BIOPSY;  Surgeon: Saintclair Jasper, MD;  Location: [facility] ENDOSCOPY;  Service: Gastroenterology;;   BRONCHIAL BIOPSY  03/15/2020   Procedure: BRONCHIAL BIOPSIES;  Surgeon:  Brenna Adine CROME, DO;  Location: [facility] ENDOSCOPY;  Service: Pulmonary;;   BRONCHIAL BRUSHINGS  03/15/2020   Procedure: BRONCHIAL BRUSHINGS;  Surgeon: Brenna Adine CROME, DO;  Location: [facility] ENDOSCOPY;  Service: Pulmonary;;   BRONCHIAL NEEDLE ASPIRATION BIOPSY  03/15/2020   Procedure: BRONCHIAL NEEDLE ASPIRATION BIOPSIES;  Surgeon: Brenna Adine CROME, DO;  Location: [facility] ENDOSCOPY;  Service:  Pulmonary;;   BRONCHIAL WASHINGS  03/15/2020   Procedure: BRONCHIAL WASHINGS;  Surgeon: Brenna Adine CROME, DO;  Location: [facility] ENDOSCOPY;  Service: Pulmonary;;   CHOLECYSTECTOMY     COLONOSCOPY  02/07/2010   COLONOSCOPY WITH PROPOFOL  N/A 02/15/2020   Procedure: COLONOSCOPY WITH PROPOFOL ;  Surgeon: Burnette Fallow, MD;  Location: [facility] ENDOSCOPY;  Service: Endoscopy;  Laterality: N/A;   CORONARY IMAGING/OCT N/A 12/23/2023   Procedure: CORONARY IMAGING/OCT;  Surgeon: Anner Alm ORN, MD;  [facility] INVASIVE CV LAB;  Service: Cardiovascular;  Laterality: N/A;   CORONARY STENT INTERVENTION N/A 12/23/2023   Procedure: CORONARY STENT INTERVENTION;  Surgeon: Anner Alm ORN, MD;  Location: [facility] INVASIVE CV LAB;  Service: Cardiovascular;  Laterality: N/A;   DILATION AND CURETTAGE OF UTERUS     DILATION AND CURETTAGE OF UTERUS N/A 10/19/2023   Procedure: DILATION AND CURETTAGE;  Surgeon: Jeralyn Crutch, MD;  Location: [facility] OR;  Service: Gynecology;  Laterality: N/A;  ANESTHESIA REQUEST TO BE UNDER IV CONSCIOUS SEDATION LIKE FOR A COLONOSCOPY IF POSSIBLE   ENDOSCOPIC MUCOSAL RESECTION N/A 06/14/2020   Procedure: ENDOSCOPIC MUCOSAL RESECTION;  Surgeon: Wilhelmenia Aloha Raddle., MD;  Location: [facility] ENDOSCOPY;  Service: Gastroenterology;  Laterality: N/A;   ESOPHAGOGASTRODUODENOSCOPY N/A 12/31/2023   Procedure: EGD (ESOPHAGOGASTRODUODENOSCOPY);  Surgeon: Rosalie Kitchens, MD;  Location: [facility] ENDOSCOPY;  Service: Gastroenterology;  Laterality: N/A;   ESOPHAGOGASTRODUODENOSCOPY (EGD) WITH PROPOFOL  N/A 02/15/2020   Procedure: ESOPHAGOGASTRODUODENOSCOPY  (EGD) WITH PROPOFOL ;  Surgeon: Burnette Fallow, MD;  Location: [facility] ENDOSCOPY;  Service: Endoscopy;  Laterality: N/A;   ESOPHAGOGASTRODUODENOSCOPY (EGD) WITH PROPOFOL  N/A 06/14/2020   Procedure: ESOPHAGOGASTRODUODENOSCOPY (EGD) WITH PROPOFOL ;  Surgeon: Wilhelmenia Aloha Raddle., MD;  Location: [facility] ENDOSCOPY;  Service: Gastroenterology;  Laterality: N/A;   ESOPHAGOGASTRODUODENOSCOPY (EGD) WITH PROPOFOL  Left 03/02/2023   Procedure: ESOPHAGOGASTRODUODENOSCOPY (EGD) WITH PROPOFOL ;  Surgeon: Saintclair Jasper, MD;  Location: [facility] ENDOSCOPY;  Service: Gastroenterology;  Laterality: Left;   FOREIGN BODY REMOVAL  06/14/2020   Procedure: FOREIGN BODY REMOVAL;  Surgeon: Wilhelmenia Aloha Raddle., MD;  Location: [facility] ENDOSCOPY;  Service: Gastroenterology;;   HYSTEROSCOPY     LAPAROSCOPIC APPENDECTOMY N/A 09/23/2015   Procedure: APPENDECTOMY LAPAROSCOPIC;  Surgeon: Dann Hummer, MD;  Location: [facility] OR;  Service: General;  Laterality: N/A;   LEFT HEART CATH AND CORONARY ANGIOGRAPHY N/A 12/23/2023   Procedure: LEFT HEART CATH AND CORONARY ANGIOGRAPHY;  Surgeon: Anner Alm ORN, MD;  Location: [facility] INVASIVE CV LAB;  Service: Cardiovascular;  Laterality: N/A;   MYOMECTOMY  1990, 1997   x 2 - In 1997, noted to have extensive pelvic adhesions and BL tubal obstruction   POLYPECTOMY  02/15/2020   Procedure: POLYPECTOMY;  Surgeon: Burnette Fallow, MD;  Location: [facility] ENDOSCOPY;  Service: Endoscopy;;   TONSILLECTOMY     TRANSTHORACIC ECHOCARDIOGRAM  06/2014    EF 55-60%.  No RWMA. Gr 1 DD. Cannot R/o PFO (consider bubble study).   UPPER ESOPHAGEAL ENDOSCOPIC ULTRASOUND (EUS) N/A 06/14/2020   Procedure: UPPER ESOPHAGEAL ENDOSCOPIC ULTRASOUND (EUS);  Surgeon: Wilhelmenia Aloha Raddle., MD;  Location: [facility] ENDOSCOPY;  Service: Gastroenterology;  Laterality: N/A;   UPPER GASTROINTESTINAL ENDOSCOPY  12/18/2009   VIDEO BRONCHOSCOPY WITH ENDOBRONCHIAL NAVIGATION N/A 03/15/2020   Procedure: VIDEO BRONCHOSCOPY WITH ENDOBRONCHIAL NAVIGATION;  Surgeon: Brenna Adine CROME, DO;  Location: [facility] ENDOSCOPY;  Service: Pulmonary;  Laterality: N/A;   VIDEO BRONCHOSCOPY WITH ENDOBRONCHIAL ULTRASOUND  03/15/2020   Procedure: VIDEO BRONCHOSCOPY WITH ENDOBRONCHIAL ULTRASOUND;  Surgeon: Brenna Adine CROME, DO;  Location: [facility] ENDOSCOPY;  Service: Pulmonary;;   WISDOM TOOTH EXTRACTION  Family History  Problem Relation Age of Onset   COPD Mother    Hypertension Mother    Heart disease Father 19       had 5 heart attacks, first in late 6s   COPD Father    Hypertension Father    Colon cancer Neg Hx    Esophageal cancer Neg Hx    Inflammatory bowel disease Neg Hx    Liver disease Neg Hx    Pancreatic cancer Neg Hx    Rectal cancer Neg Hx    Stomach cancer Neg Hx    Thyroid  disease Neg Hx      Social History   Tobacco Use   Smoking status: Former    Current packs/day: 0.00    Average packs/day: 3.0 packs/day for 50.0 years (150.0 ttl pk-yrs)    Types: Cigarettes    Start date: 09/22/1965    Quit date: 09/23/2015    Years since quitting: 8.3   Smokeless tobacco: Never   Tobacco comments:    Counseling sheet 07-2011   Vaping Use   Vaping status: Never Used  Substance Use Topics   Alcohol use: No    Alcohol/week: 0.0 standard drinks of alcohol   Drug use: No    Current Outpatient Medications on File Prior to Visit  Medication Sig Dispense Refill   albuterol  (PROAIR  HFA) 108 (90 Base) MCG/ACT inhaler INHALE 2 PUFFS EVERY 6  HOURS AS NEEDED FOR  SHORTNESS OF BREATH OR  WHEEZING 34 g 3   bisoprolol  (ZEBETA ) 10 MG tablet Take 1 & 1/2  tablets (15 mg total) by mouth daily. 135 tablet 2   calcium  carbonate (TUMS - DOSED IN MG ELEMENTAL CALCIUM ) 500 MG chewable tablet Chew 1 tablet by mouth daily as needed for indigestion or heartburn.     clopidogrel  (PLAVIX ) 75 MG tablet Take 1 tablet (75 mg total) by mouth daily with breakfast. 90 tablet 2   Collagen-Vitamin C-Biotin (COLLAGEN PO) Take by mouth daily.     diphenhydrAMINE  (BENADRYL ) 25 MG tablet Take 50  mg by mouth at bedtime as needed for sleep.     docusate sodium  (COLACE) 100 MG capsule Take 300 mg by mouth daily.     estradiol  (ESTRACE  VAGINAL) 0.1 MG/GM vaginal cream Place 1 Applicatorful vaginally at bedtime. 42.5 g 12   ezetimibe  (ZETIA ) 10 MG tablet Take 10 mg by mouth daily.     fluticasone  (FLONASE ) 50 MCG/ACT nasal spray USE 2 SPRAYS IN BOTH NOSTRILS  DAILY 16 g 5   furosemide  (LASIX ) 40 MG tablet TAKE 1 TABLET BY MOUTH DAILY AS  NEEDED FOR WEIGHT GAIN OF 3 LB  OVER AT NIGHT OR 5 LB IN 1 WEEK 100 tablet 2   ipratropium-albuterol  (DUONEB) 0.5-2.5 (3) MG/3ML SOLN Take 3 mLs by nebulization every 6 (six) hours as needed. 360 mL 6   levothyroxine  (SYNTHROID ) 200 MCG tablet Take 200 mcg by mouth at bedtime.     Multiple Vitamins-Minerals (HAIR SKIN & NAILS PO) Take by mouth daily.     nitroGLYCERIN  (NITROSTAT ) 0.4 MG SL tablet Place 0.4 mg under the tongue every 5 (five) minutes as needed for chest pain.     OZEMPIC, 0.25 OR 0.5 MG/DOSE, 2 MG/3ML SOPN Inject 0.5 mg into the skin once a week.     pantoprazole  (PROTONIX ) 40 MG tablet Take 1 tablet (40 mg total) by mouth 2 (two) times daily before a meal. 60 tablet 0   potassium chloride  (KLOR-CON  M) 10 MEQ tablet Take 2  tablets (20 mEq total) by mouth daily for 3 days, THEN 1 tablet (10 mEq total) daily. 90 tablet 3   pregabalin  (LYRICA ) 50 MG capsule Take 50 mg by mouth daily.     REPATHA  SURECLICK 140 MG/ML SOAJ INJECT 1 PEN SUBCUTANEOUSLY  EVERY 2 WEEKS 6 mL 3   Respiratory Therapy Supplies (NEBULIZER/TUBING/MOUTHPIECE) KIT 1 application by Does not apply route every 6 (six) hours as needed. FAX TO [phone] 1 kit 11   rOPINIRole  (REQUIP ) 0.5 MG tablet Take 1 tablet (0.5 mg total) by mouth at bedtime. 30 tablet 0   sucralfate  (CARAFATE ) 1 g tablet Take 1 tablet (1 g total) by mouth 4 (four) times daily. 120 tablet 0   traMADol  (ULTRAM ) 50 MG tablet Take 1 tablet (50 mg total) by mouth every 6 (six) hours as needed for chronic back pain  90 tablet 2   valACYclovir  (VALTREX ) 500 MG tablet TAKE 1 TABLET BY MOUTH  DAILY 90 tablet 3   Vibegron  75 MG TABS Take 1 tablet (75 mg total) by mouth daily. 90 tablet 3   BREZTRI  AEROSPHERE 160-9-4.8 MCG/ACT AERO inhaler USE 2 INHALATIONS BY MOUTH INTO  THE LUNGS IN THE MORNING AND AT  BEDTIME (Patient not taking: Reported on 02/08/2024) 32.1 g 3   glucose blood (ONETOUCH VERIO) test strip Use to check blood sugar twice daily. Dx: E11.9 (Patient not taking: Reported on 02/08/2024) 300 each 1   Lancets (ONETOUCH DELICA PLUS LANCET33G) MISC USE TO CHECK BLOOD SUGAR  TWICE DAILY (Patient not taking: Reported on 02/08/2024) 200 each 3   traMADol  (ULTRAM ) 50 MG tablet Take 50 mg by mouth every 6 (six) hours as needed for moderate pain (pain score 4-6) or severe pain (pain score 7-10). (Patient not taking: Reported on 02/08/2024)     [DISCONTINUED] ipratropium (ATROVENT  HFA) 17 MCG/ACT inhaler Inhale 2 puffs into the lungs 4 (four) times daily. 1 Inhaler 6   No current facility-administered medications on file prior to visit.     Review of Systems  Constitutional:  Positive for fatigue. Negative for fever.  HENT: Negative.         Hair loss, brittle nails  Eyes: Negative.   Respiratory: Negative.    Cardiovascular: Negative.   Gastrointestinal:  Positive for constipation.  Endocrine: Negative.   Genitourinary: Negative.   Musculoskeletal:  Positive for back pain.  Skin: Negative.   Neurological:  Positive for headaches. Negative for dizziness.  Psychiatric/Behavioral: Negative.          Objective:    BP 116/70 (BP Location: Left Arm, Patient Position: Sitting, Cuff Size: Normal)   Pulse 80   Temp (!) 96.5 F (35.8 C)   Ht 5' 2.5 (1.588 m)   Wt 161 lb 6.4 oz (73.2 kg)   LMP 02/27/2014   SpO2 96%   BMI 29.05 kg/m   Wt Readings from Last 3 Encounters:  02/08/24 161 lb 6.4 oz (73.2 kg)  02/02/24 164 lb 10.9 oz (74.7 kg)  01/21/24 162 lb (73.5 kg)    BP Readings from Last 3  Encounters:  02/08/24 116/70  02/02/24 138/86  01/21/24 98/63    Physical Exam Vitals and nursing note reviewed.  Constitutional:      General: She is not in acute distress.    Appearance: Normal appearance.  HENT:     Head: Normocephalic and atraumatic.     Right Ear: Tympanic membrane, ear canal and external ear normal.     Left Ear: Tympanic membrane, ear canal  and external ear normal.  Eyes:     Conjunctiva/sclera: Conjunctivae normal.  Cardiovascular:     Rate and Rhythm: Normal rate and regular rhythm.     Pulses: Normal pulses.     Heart sounds: Normal heart sounds.  Pulmonary:     Effort: Pulmonary effort is normal.     Breath sounds: Normal breath sounds.  Abdominal:     Palpations: Abdomen is soft.     Tenderness: There is no abdominal tenderness.  Musculoskeletal:        General: Normal range of motion.     Cervical back: Normal range of motion and neck supple.     Right lower leg: No edema.     Left lower leg: No edema.     Comments: Generalized weakness  Lymphadenopathy:     Cervical: No cervical adenopathy.  Skin:    General: Skin is warm and dry.  Neurological:     General: No focal deficit present.     Mental Status: She is alert and oriented to person, place, and time.     Cranial Nerves: No cranial nerve deficit.     Coordination: Coordination normal.     Gait: Gait normal.  Psychiatric:        Mood and Affect: Mood normal.        Behavior: Behavior normal.        Thought Content: Thought content normal.        Judgment: Judgment normal.        Assessment & Plan:   Problem List Items Addressed This Visit       Cardiovascular and Mediastinum   Essential hypertension (Chronic)   Chronic, stable. Continue bisoprolol  10mg  daily.       Relevant Orders   CBC with Differential/Platelet   Comprehensive metabolic panel with GFR   Lipid panel   Migraine without aura and without status migrainosus, not intractable   Chronic, stable. Hopefully  starting CPAP will help with headaches. Follow-up in 3 months.       CAD S/P percutaneous coronary angioplasty   She has coronary artery disease with stent placement and recent chest pain episodes. She is under regular cardiology follow-up. Continue Plavix  75 mg daily and bisoprolol  10 mg daily, and maintain regular follow-up with cardiology.        Respiratory   COPD, severe (HCC)   Her COPD is managed with nebulizer treatments, but she is not using the Breztri  inhaler. Reports exertional dyspnea and is under ongoing pulmonology follow-up. Continue nebulizer treatments twice daily and discuss inhaler use with the pulmonologist.      Relevant Medications   COVID-19 mRNA vaccine, Pfizer, (COMIRNATY) syringe   OSA on CPAP   She has severe sleep apnea with 28-30 events per hour and lacks necessary CPAP tubing and mask. Scheduled for pulmonology follow-up. Continue follow-up with pulmonology for CPAP management.        Endocrine   Type 2 diabetes mellitus without complication, without long-term current use of insulin  (HCC) (Chronic)   Chronic, stable. Her diabetes is managed with Ozempic 0.5 mg weekly, with the last A1c at 5.7%. Significant weight loss achieved. Continue Ozempic 0.5 mg weekly and order CMP, CBC, and urine microalbumin to monitor diabetes management.      Relevant Medications   COVID-19 mRNA vaccine, Pfizer, (COMIRNATY) syringe   Other Relevant Orders   CBC with Differential/Platelet   Comprehensive metabolic panel with GFR   Microalbumin / creatinine urine ratio   Hypothyroidism  She is on levothyroxine  200 mcg daily but reports symptoms like hair loss and thin fingernails despite normal thyroid  tests. Plan to order TSH, T4, T3 to reassess thyroid  function.      Relevant Orders   TSH   T4, free   T3, free     Other   Hyperlipidemia LDL goal <70 (Chronic)   Chronic, stable. She has statin intolerance. Continue zetia  10mg  daily and repatha  140mg  every 2 weeks.  Check CMP, CBC, lipid panel today.       History of CVA (cerebrovascular accident)   History of stroke, unable to tolerate aspirin . Is currently taking plavix  75mg  daily post cardiac stent. Check lipid panel today.       Chronic low back pain   She experiences severe back pain affecting daily activities, with current pain management ineffective. Not interested in specialist referral. Provide stretches and exercises for back pain management. Continue tramadol  50mg  every 8 hours as needed for severe pain.       Overweight (BMI 25.0-29.9)   BMI 29. She has lost about 30 pounds since starting ozempic. Congratulated her on this. Continue ozempic 0.5mg  weekly and focus on nutrition and exercise.       Restless leg syndrome   Her symptoms are well-controlled with pregabalin  50mg  and ropinirole  0.5mg  at bedtime. Continue pregabalin  and ropinirole  as prescribed.       Hair loss - Primary   She experiences persistent hair loss, worsening recently, primarily on the vertex. No family history of hair loss. Plan to order TSH, T4, T3 to assess thyroid  function and check iron , ferritin, vitamin D, and vitamin B12 levels for deficiencies.      Relevant Orders   Ferritin   Iron    VITAMIN D 25 Hydroxy (Vit-D Deficiency, Fractures)   Vitamin B12   Long-term current use of injectable noninsulin antidiabetic medication   Continue ozempic 0.5mg  weekly       Other Visit Diagnoses       Research study patient       Relevant Orders   [employer] Molecular Screen - Blood       Follow up plan: Return in about 3 months (around 05/10/2024) for routine follow-up.  Lena Fieldhouse A Mitchael Luckey

## 2024-02-08 NOTE — Assessment & Plan Note (Signed)
 History of stroke, unable to tolerate aspirin . Is currently taking plavix  75mg  daily post cardiac stent. Check lipid panel today.

## 2024-02-08 NOTE — Assessment & Plan Note (Signed)
 Chronic, stable. Her diabetes is managed with Ozempic 0.5 mg weekly, with the last A1c at 5.7%. Significant weight loss achieved. Continue Ozempic 0.5 mg weekly and order CMP, CBC, and urine microalbumin to monitor diabetes management.

## 2024-02-08 NOTE — Assessment & Plan Note (Signed)
 She experiences severe back pain affecting daily activities, with current pain management ineffective. Not interested in specialist referral. Provide stretches and exercises for back pain management. Continue tramadol  50mg  every 8 hours as needed for severe pain.

## 2024-02-08 NOTE — Assessment & Plan Note (Signed)
Continue ozempic 0.5mg  weekly

## 2024-02-08 NOTE — Assessment & Plan Note (Signed)
 Chronic, stable. Hopefully starting CPAP will help with headaches. Follow-up in 3 months.

## 2024-02-08 NOTE — Assessment & Plan Note (Signed)
 Chronic, stable. She has statin intolerance. Continue zetia  10mg  daily and repatha  140mg  every 2 weeks. Check CMP, CBC, lipid panel today.

## 2024-02-08 NOTE — Assessment & Plan Note (Signed)
 BMI 29. She has lost about 30 pounds since starting ozempic. Congratulated her on this. Continue ozempic 0.5mg  weekly and focus on nutrition and exercise.

## 2024-02-08 NOTE — Patient Instructions (Signed)
 It was great to see you!  We are checking your labs today and will let you know the results via mychart/phone.    Keep taking your medications as prescribed   Let's follow-up in 3 months, sooner if you have concerns.  If a referral was placed today, you will be contacted for an appointment. Please note that routine referrals can sometimes take up to 3-4 weeks to process. Please call our office if you haven't heard anything after this time frame.  Take care,  Tinnie Harada, NP

## 2024-02-08 NOTE — Assessment & Plan Note (Signed)
 She experiences persistent hair loss, worsening recently, primarily on the vertex. No family history of hair loss. Plan to order TSH, T4, T3 to assess thyroid  function and check iron , ferritin, vitamin D, and vitamin B12 levels for deficiencies.

## 2024-02-08 NOTE — Assessment & Plan Note (Signed)
 Her symptoms are well-controlled with pregabalin  50mg  and ropinirole  0.5mg  at bedtime. Continue pregabalin  and ropinirole  as prescribed.

## 2024-02-08 NOTE — Assessment & Plan Note (Signed)
 She has coronary artery disease with stent placement and recent chest pain episodes. She is under regular cardiology follow-up. Continue Plavix  75 mg daily and bisoprolol  10 mg daily, and maintain regular follow-up with cardiology.

## 2024-02-09 ENCOUNTER — Ambulatory Visit: Payer: Self-pay

## 2024-02-09 NOTE — Telephone Encounter (Signed)
 Documented on previous crm.

## 2024-02-09 NOTE — Telephone Encounter (Signed)
 1st attempt to contact patient, no answer, LVM to call back, routing for further attempts    Copied from CRM [id number]. Topic: Clinical - Medical Advice >> Feb 09, 2024 10:41 AM Grace [name] wrote: Reason for CRM: Pt is wanting to know if medication traMADol  (ULTRAM ) 50 MG tablet is affecting her breathing. Pt does have COPD. Pt requested a callback for this.

## 2024-02-09 NOTE — Telephone Encounter (Signed)
 Called and spoke with patient. Her shortness of breath has improved today. Discussed that she should stop taking the tramadol  for a week and see if this improves her breathing. She has an appointment with pulmonology in 2 days and declines a sooner visit. Oxygen  levels are around 95%. Able to talk in complete sentences on the phone.

## 2024-02-09 NOTE — Telephone Encounter (Signed)
 Copied from CRM [id number]. Topic: Clinical - Medical Advice >> Feb 09, 2024 10:41 AM Grace [name] wrote: Reason for CRM: Pt is wanting to know if medication traMADol  (ULTRAM ) 50 MG tablet is affecting her breathing. Pt does have COPD. Pt requested a callback for this. >> Feb 09, 2024 11:28 AM Roselie BROCKS wrote: NT tried to call patient ,and she missed it and is returning  call to NT

## 2024-02-09 NOTE — Telephone Encounter (Signed)
 FYI Only or Action Required?: Action required by provider: clinical question for provider.  Patient was last seen in primary care on 02/08/2024 by Nedra Tinnie LABOR, NP.  Called Nurse Triage reporting Medication Problem.  Symptoms began several days ago.  Interventions attempted: Prescription medications: neb tx, inhaler.  Symptoms are: unchanged.  Triage Disposition: See PCP When Office is Open (Within 3 Days)  Patient/caregiver understands and will follow disposition?: No, wishes to speak with PCP  Reason for Disposition  [1] MODERATE longstanding difficulty breathing (e.g., speaks in phrases, SOB even at rest, pulse 100-120) AND [2] SAME as normal  Answer Assessment - Initial Assessment Questions No available appts with pcp. Offered appt with alternative provider, pt declined.  Patient requests call back regarding Tramadol .  Uses tramadol , takes 2 daily for back pain, not sure if Tramadol  is causing sob.  Advised call 911/ED if symptoms worsen.  1. RESPIRATORY STATUS: Describe your breathing? (e.g., wheezing, shortness of breath, unable to speak, severe coughing)      Sob, chest feels tight Upcoming pulm appt 2. ONSET: When did this breathing problem begin?      6-8 weeks, getting worse 3. PATTERN Does the difficult breathing come and go, or has it been constant since it started?      Comes and goes 4. SEVERITY: How bad is your breathing? (e.g., mild, moderate, severe)      severe  6. CARDIAC HISTORY: Do you have any history of heart disease? (e.g., heart attack, angina, bypass surgery, angioplasty)      Stents placed in September, chf 7. LUNG HISTORY: Do you have any history of lung disease?  (e.g., pulmonary embolus, asthma, emphysema)     copd 8. CAUSE: What do you think is causing the breathing problem?      Tramadol ; thinks may be causing 9. OTHER SYMPTOMS: Do you have any other symptoms? (e.g., chest pain, cough, dizziness, fever, runny nose)      Denies chest pain, dizziness, fever, chills, n/v, cough, congestion Reports runny nose 10. O2 SATURATION MONITOR:  Do you use an oxygen  saturation monitor (pulse oximeter) at home? If Yes, ask: What is your reading (oxygen  level) today? What is your usual oxygen  saturation reading? (e.g., 95%) Current 95% RA       Used cpap machine last night for few seconds(need proper equipment),used neb tx, maintenance inhaler,  No oxygen  prescribed, rescue inhaler(have not used) 12. TRAVEL: Have you traveled out of the country in the last month? (e.g., travel history, exposures)       no  Protocols used: Breathing Difficulty-A-AH

## 2024-02-10 ENCOUNTER — Encounter (HOSPITAL_COMMUNITY)
Admission: RE | Admit: 2024-02-10 | Discharge: 2024-02-10 | Disposition: A | Discharge status: Home / Self Care | Source: Ambulatory Visit | Attending: Cardiology | Admitting: Cardiology

## 2024-02-10 DIAGNOSIS — Z955 Presence of coronary angioplasty implant and graft: Secondary | ICD-10-CM

## 2024-02-10 LAB — GLUCOSE, CAPILLARY
Glucose-Capillary: 151 mg/dL — ABNORMAL HIGH (ref 70–99)
Glucose-Capillary: 150 mg/dL — ABNORMAL HIGH (ref 70–99)

## 2024-02-10 NOTE — Telephone Encounter (Signed)
 Noted. PCP called and spoke with patient.

## 2024-02-10 NOTE — Progress Notes (Signed)
 Daily Session Note  Patient Details  Name: Grace [name] MRN: [id number] Date of Birth: [birth year] Referring Provider:   Flowsheet Row INTENSIVE CARDIAC REHAB ORIENT from 02/02/2024 in [facility]  Referring Provider Anner Alm ORN, MD    Encounter Date: 02/10/2024  Check In:  Session Check In - 02/10/24 1327       Check-In   Supervising physician immediately available to respond to emergencies [facility] MD immediately available    Physician(s) Lum Louis, NP    Location [facility]    Staff Present Alm Parkins, MS, ACSM-CEP, CCRP, Exercise Physiologist;Olinty Valere, MS, ACSM-CEP, Exercise Physiologist;Johnny Fayette, MS, Exercise Physiologist;Jetta Vannie BS, ACSM-CEP, Exercise Physiologist;Curley Hogen Lennon, RN, Jayson Quan, RN, BSN    Virtual Visit No    Medication changes reported     No    Fall or balance concerns reported    No    Tobacco Cessation No Change    Warm-up and Cool-down Performed as group-led Writer Performed No    VAD Patient? No    PAD/SET Patient? No      Pain Assessment   Currently in Pain? No/denies    Pain Score 0-No pain    Multiple Pain Sites No          Capillary Blood Glucose: Results for orders placed or performed during the hospital encounter of 02/10/24 (from the past 24 hours)  Glucose, capillary     Status: Abnormal   Collection Time: 02/10/24 12:47 PM  Result Value Ref Range   Glucose-Capillary 151 (H) 70 - 99 mg/dL  Glucose, capillary     Status: Abnormal   Collection Time: 02/10/24  1:54 PM  Result Value Ref Range   Glucose-Capillary 150 (H) 70 - 99 mg/dL   *Note: Due to a large number of results and/or encounters for the requested time period, some results have not been displayed. A complete set of results can be found in Results Review.     Exercise Prescription Changes - 02/10/24 1400       Response to  Exercise   Blood Pressure (Admit) 114/72    Blood Pressure (Exercise) 118/76    Blood Pressure (Exit) 112/72    Heart Rate (Admit) 86 bpm    Heart Rate (Exercise) 124 bpm    Heart Rate (Exit) 94 bpm    Rating of Perceived Exertion (Exercise) 13    Symptoms None    Comments Pt's first day in the CRP2 program    Duration Continue with 30 min of aerobic exercise without signs/symptoms of physical distress.    Intensity THRR unchanged      Progression   Progression Continue to progress workloads to maintain intensity without signs/symptoms of physical distress.    Average METs 2.25      Resistance Training   Training Prescription No    Weight No wts on wednesdays      Interval Training   Interval Training No      Recumbant Bike   Level 1    RPM 71    Watts 15    Minutes 15    METs 2.3      NuStep   Level 1    SPM 84    Minutes 15    METs 2.2          Social History   Tobacco Use  Smoking Status Former   Current packs/day: 0.00  Average packs/day: 3.0 packs/day for 50.0 years (150.0 ttl pk-yrs)   Types: Cigarettes   Start date: 09/22/1965   Quit date: 09/23/2015   Years since quitting: 8.3  Smokeless Tobacco Never  Tobacco Comments   Counseling sheet 07-2011     Goals Met:  Exercise tolerated well No report of concerns or symptoms today  Goals Unmet:  Not Applicable  Comments: Pt started cardiac rehab today.  Pt tolerated light exercise without difficulty. VSS, telemetry-NSR, asymptomatic.  Medication list reconciled. Pt denies barriers to medication compliance.  PSYCHOSOCIAL ASSESSMENT:  PHQ-4. Pt exhibits positive coping skills, hopeful outlook with supportive family. No psychosocial needs identified at this time, no psychosocial interventions necessary.  Pt oriented to exercise equipment and routine.    Understanding verbalized.     Dr. Wilbert Bihari is Medical Director for Cardiac Rehab at [facility].

## 2024-02-11 ENCOUNTER — Ambulatory Visit: Payer: Self-pay | Admitting: Nurse Practitioner

## 2024-02-11 ENCOUNTER — Ambulatory Visit: Admitting: Nurse Practitioner

## 2024-02-11 ENCOUNTER — Encounter

## 2024-02-11 ENCOUNTER — Other Ambulatory Visit: Payer: Self-pay | Admitting: Medical Genetics

## 2024-02-11 NOTE — Progress Notes (Signed)
 Severe OSA. Please move up f/u appt if able to review treatment options. Video visit ok. Thanks.

## 2024-02-12 ENCOUNTER — Encounter (HOSPITAL_COMMUNITY)
Admission: RE | Admit: 2024-02-12 | Discharge: 2024-02-12 | Disposition: A | Discharge status: Home / Self Care | Source: Ambulatory Visit | Attending: Cardiology | Admitting: Cardiology

## 2024-02-12 DIAGNOSIS — Z955 Presence of coronary angioplasty implant and graft: Secondary | ICD-10-CM | POA: Diagnosis not present

## 2024-02-12 LAB — GLUCOSE, CAPILLARY
Glucose-Capillary: 93 mg/dL (ref 70–99)
Glucose-Capillary: 91 mg/dL (ref 70–99)

## 2024-02-15 ENCOUNTER — Encounter (HOSPITAL_COMMUNITY)
Admission: RE | Admit: 2024-02-15 | Discharge: 2024-02-15 | Disposition: A | Discharge status: Home / Self Care | Source: Ambulatory Visit | Attending: Cardiology | Admitting: Cardiology

## 2024-02-15 DIAGNOSIS — Z955 Presence of coronary angioplasty implant and graft: Secondary | ICD-10-CM | POA: Diagnosis present

## 2024-02-15 DIAGNOSIS — Z48812 Encounter for surgical aftercare following surgery on the circulatory system: Secondary | ICD-10-CM | POA: Insufficient documentation

## 2024-02-16 NOTE — Progress Notes (Signed)
 Cardiac Individual Treatment Plan  Patient Details  Name: MADALENE MICKLER MRN: [id number] Date of Birth: Jul 27, 1955 Referring Provider:   Flowsheet Row INTENSIVE CARDIAC REHAB ORIENT from 02/02/2024 in [facility] for Heart, Vascular, & Lung Health  Referring Provider Anner Alm ORN, MD    Initial Encounter Date:  Flowsheet Row INTENSIVE CARDIAC REHAB ORIENT from 02/02/2024 in [facility] for Heart, Vascular, & Lung Health  Date 02/02/24    Visit Diagnosis: 12/23/23 Status post coronary artery stent placement DES RCA  Patient's Home Medications on Admission:  Current Outpatient Medications:    albuterol  (PROAIR  HFA) 108 (90 Base) MCG/ACT inhaler, INHALE 2 PUFFS EVERY 6  HOURS AS NEEDED FOR  SHORTNESS OF BREATH OR  WHEEZING, Disp: 34 g, Rfl: 3   bisoprolol  (ZEBETA ) 10 MG tablet, Take 1 & 1/2  tablets (15 mg total) by mouth daily., Disp: 135 tablet, Rfl: 2   BREZTRI  AEROSPHERE 160-9-4.8 MCG/ACT AERO inhaler, USE 2 INHALATIONS BY MOUTH INTO  THE LUNGS IN THE MORNING AND AT  BEDTIME (Patient not taking: Reported on 02/08/2024), Disp: 32.1 g, Rfl: 3   calcium  carbonate (TUMS - DOSED IN MG ELEMENTAL CALCIUM ) 500 MG chewable tablet, Chew 1 tablet by mouth daily as needed for indigestion or heartburn., Disp: , Rfl:    clopidogrel  (PLAVIX ) 75 MG tablet, Take 1 tablet (75 mg total) by mouth daily with breakfast., Disp: 90 tablet, Rfl: 2   Collagen-Vitamin C-Biotin (COLLAGEN PO), Take by mouth daily., Disp: , Rfl:    dapagliflozin propanediol (FARXIGA) 5 MG TABS tablet, Take 1 tablet (5 mg total) by mouth daily., Disp: 90 tablet, Rfl: 0   diphenhydrAMINE  (BENADRYL ) 25 MG tablet, Take 50 mg by mouth at bedtime as needed for sleep., Disp: , Rfl:    docusate sodium  (COLACE) 100 MG capsule, Take 300 mg by mouth daily., Disp: , Rfl:    estradiol  (ESTRACE  VAGINAL) 0.1 MG/GM vaginal cream, Place 1 Applicatorful vaginally at bedtime., Disp: 42.5 g, Rfl: 12    ezetimibe  (ZETIA ) 10 MG tablet, Take 10 mg by mouth daily., Disp: , Rfl:    fluticasone  (FLONASE ) 50 MCG/ACT nasal spray, USE 2 SPRAYS IN BOTH NOSTRILS  DAILY, Disp: 16 g, Rfl: 5   furosemide  (LASIX ) 40 MG tablet, TAKE 1 TABLET BY MOUTH DAILY AS  NEEDED FOR WEIGHT GAIN OF 3 LB  OVER AT NIGHT OR 5 LB IN 1 WEEK, Disp: 100 tablet, Rfl: 2   glucose blood (ONETOUCH VERIO) test strip, Use to check blood sugar twice daily. Dx: E11.9 (Patient not taking: Reported on 02/08/2024), Disp: 300 each, Rfl: 1   ipratropium-albuterol  (DUONEB) 0.5-2.5 (3) MG/3ML SOLN, Take 3 mLs by nebulization every 6 (six) hours as needed., Disp: 360 mL, Rfl: 6   Lancets (ONETOUCH DELICA PLUS LANCET33G) MISC, USE TO CHECK BLOOD SUGAR  TWICE DAILY (Patient not taking: Reported on 02/08/2024), Disp: 200 each, Rfl: 3   levothyroxine  (SYNTHROID ) 175 MCG tablet, Take 1 tablet (175 mcg total) by mouth daily., Disp: 90 tablet, Rfl: 0   Multiple Vitamins-Minerals (HAIR SKIN & NAILS PO), Take by mouth daily., Disp: , Rfl:    nitroGLYCERIN  (NITROSTAT ) 0.4 MG SL tablet, Place 0.4 mg under the tongue every 5 (five) minutes as needed for chest pain., Disp: , Rfl:    OZEMPIC, 0.25 OR 0.5 MG/DOSE, 2 MG/3ML SOPN, Inject 0.5 mg into the skin once a week., Disp: , Rfl:    pantoprazole  (PROTONIX ) 40 MG tablet, Take 1 tablet (40  mg total) by mouth 2 (two) times daily before a meal., Disp: 60 tablet, Rfl: 0   potassium chloride  (KLOR-CON  M) 10 MEQ tablet, Take 2 tablets (20 mEq total) by mouth daily for 3 days, THEN 1 tablet (10 mEq total) daily., Disp: 90 tablet, Rfl: 3   pregabalin  (LYRICA ) 50 MG capsule, Take 50 mg by mouth daily., Disp: , Rfl:    REPATHA  SURECLICK 140 MG/ML SOAJ, INJECT 1 PEN SUBCUTANEOUSLY  EVERY 2 WEEKS, Disp: 6 mL, Rfl: 3   Respiratory Therapy Supplies (NEBULIZER/TUBING/MOUTHPIECE) KIT, 1 application by Does not apply route every 6 (six) hours as needed. FAX TO [phone], Disp: 1 kit, Rfl: 11   rOPINIRole  (REQUIP ) 0.5 MG  tablet, Take 1 tablet (0.5 mg total) by mouth at bedtime., Disp: 30 tablet, Rfl: 0   sucralfate  (CARAFATE ) 1 g tablet, Take 1 tablet (1 g total) by mouth 4 (four) times daily., Disp: 120 tablet, Rfl: 0   traMADol  (ULTRAM ) 50 MG tablet, Take 50 mg by mouth every 6 (six) hours as needed for moderate pain (pain score 4-6) or severe pain (pain score 7-10). (Patient not taking: Reported on 02/08/2024), Disp: , Rfl:    traMADol  (ULTRAM ) 50 MG tablet, Take 1 tablet (50 mg total) by mouth every 6 (six) hours as needed for chronic back pain, Disp: 90 tablet, Rfl: 2   valACYclovir  (VALTREX ) 500 MG tablet, TAKE 1 TABLET BY MOUTH  DAILY, Disp: 90 tablet, Rfl: 3   Vibegron  75 MG TABS, Take 1 tablet (75 mg total) by mouth daily., Disp: 90 tablet, Rfl: 3  Past Medical History: Past Medical History:  Diagnosis Date   Abnormal vaginal bleeding    Anxiety    Arthritis    Asthma    Barrett's esophagus    with high grade dysplasia per endoscopy 05/2005 // followed by Dr. Lupita Commander ([facility])   Blood transfusion without reported diagnosis    Cataract 2019   Not sure, due to inhaler use   Chronic back pain    Complication of anesthesia    per patient she went in respiratory failure in recovery room in 2017   COPD (chronic obstructive pulmonary disease) (HCC)    Diabetes mellitus without complication (HCC)    type 2   Duodenal ulcer disease    thought to be contributed at least partly by overuse of NSAIDs and salicylates (goody powdr)   Emphysema of lung [facility])    Endometrial polyp 03/2003   s/p resection in 03/2003. 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   Migraine    Nevus    Personal history of failed conscious sedation 08/11/2011   Sleep apnea    does not use cpap   Stroke (HCC) 10/2000  Tachycardia    Thyroid  disease    Tobacco abuse 04/2012   quit    Tobacco Use: Social History   Tobacco Use  Smoking Status Former   Current packs/day: 0.00   Average packs/day: 3.0 packs/day for 50.0 years (150.0 ttl pk-yrs)   Types: Cigarettes   Start date: 09/22/1965   Quit date: 09/23/2015   Years since quitting: 8.4  Smokeless Tobacco Never  Tobacco Comments   Counseling sheet 07-2011     Labs: Review Flowsheet  More data exists      Latest Ref Rng & Units 02/28/2023 12/08/2023 12/29/2023 12/30/2023 02/08/2024  Labs for ITP Cardiac and Pulmonary Rehab  Cholestrol 0 - 200 mg/dL - 64  - - 898   LDL (calc) 0 - 99 mg/dL - 12  - - 20   HDL-C >60.99 mg/dL - 36  - - 39.99   Trlycerides 0.0 - 149.0 mg/dL - 68  - - 896.9   Hemoglobin A1c 4.8 - 5.6 % 6.9  - - 5.7  -  TCO2 22 - 32 mmol/L - - 26  - -    Capillary Blood Glucose: Lab Results  Component Value Date   GLUCAP 91 02/12/2024   GLUCAP 93 02/12/2024   GLUCAP 150 (H) 02/10/2024   GLUCAP 151 (H) 02/10/2024   GLUCAP 147 (H) 01/03/2024     Exercise Target Goals: Exercise Program Goal: Individual exercise prescription set using results from initial 6 min walk test and THRR while considering  patient's activity barriers and safety.   Exercise Prescription Goal: Initial exercise prescription builds to 30-45 minutes a day of aerobic activity, 2-3 days per week.  Home exercise guidelines will be given to patient during program as part of exercise prescription that the  participant will acknowledge.  Activity Barriers & Risk Stratification:  Activity Barriers & Cardiac Risk Stratification - 02/02/24 1339       Activity Barriers & Cardiac Risk Stratification   Activity Barriers Other (comment);Shortness of Breath;Back Problems    Comments [place] has issues with a staggered gait. No falls.    Cardiac Risk Stratification Moderate          6 Minute Walk:  6 Minute Walk     Row Name 02/02/24 1524         6 Minute Walk   Phase Initial     Distance 1056 feet     Walk Time 6 minutes     # of Rest Breaks 0     MPH 2     METS 1.7     RPE 11     Perceived Dyspnea  2     VO2 Peak 9.28     Symptoms Yes (comment)     Comments Mild shortness of breath     Resting HR 81 bpm     Resting BP 138/86     Resting Oxygen  Saturation  97 %     Exercise Oxygen  Saturation  during 6 min walk 90 %     Max Ex. HR 108 bpm     Max Ex. BP 154/84     2 Minute Post BP 146/88        Oxygen  Initial Assessment:   Oxygen  Re-Evaluation:   Oxygen  Discharge (Final Oxygen  Re-Evaluation):   Initial Exercise Prescription:  Initial Exercise Prescription - 02/02/24 1600       Date of Initial Exercise RX and Referring Provider   Date 02/02/24    Referring Provider Anner Alm ORN, MD  Expected Discharge Date 04/29/24      Recumbant Bike   Level 1    RPM 30    Watts 15    Minutes 15    METs 1.7      NuStep   Level 1    SPM 63    Minutes 15    METs 1.7      Prescription Details   Frequency (times per week) 3    Duration Progress to 30 minutes of continuous aerobic without signs/symptoms of physical distress      Intensity   THRR 40-80% of Max Heartrate 61-122    Ratings of Perceived Exertion 11-13    Perceived Dyspnea 0-4      Progression   Progression Continue progressive overload as per policy without signs/symptoms or physical distress.      Resistance Training   Training Prescription Yes    Weight 2 lbs    Reps 10-15           Perform Capillary Blood Glucose checks as needed.  Exercise Prescription Changes:   Exercise Prescription Changes     Row Name 02/10/24 1400             Response to Exercise   Blood Pressure (Admit) 114/72       Blood Pressure (Exercise) 118/76       Blood Pressure (Exit) 112/72       Heart Rate (Admit) 86 bpm       Heart Rate (Exercise) 124 bpm       Heart Rate (Exit) 94 bpm       Rating of Perceived Exertion (Exercise) 13       Symptoms None       Comments Pt's first day in the CRP2 program       Duration Continue with 30 min of aerobic exercise without signs/symptoms of physical distress.       Intensity THRR unchanged         Progression   Progression Continue to progress workloads to maintain intensity without signs/symptoms of physical distress.       Average METs 2.25         Resistance Training   Training Prescription No       Weight No wts on wednesdays         Interval Training   Interval Training No         Recumbant Bike   Level 1       RPM 71       Watts 15       Minutes 15       METs 2.3         NuStep   Level 1       SPM 84       Minutes 15       METs 2.2          Exercise Comments:   Exercise Comments     Row Name 02/10/24 1427           Exercise Comments Pt's first day in the CRP2 program. Pt tolerated session well with no complaints.          Exercise Goals and Review:   Exercise Goals     Row Name 02/02/24 1437             Exercise Goals   Increase Physical Activity Yes       Intervention Provide advice, education, support and counseling about physical activity/exercise needs.;Develop an  individualized exercise prescription for aerobic and resistive training based on initial evaluation findings, risk stratification, comorbidities and participant's personal goals.       Expected Outcomes Short Term: Attend rehab on a regular basis to increase amount of physical activity.;Long Term: Exercising regularly at least 3-5  days a week.;Long Term: Add in home exercise to make exercise part of routine and to increase amount of physical activity.       Increase Strength and Stamina Yes       Intervention Provide advice, education, support and counseling about physical activity/exercise needs.;Develop an individualized exercise prescription for aerobic and resistive training based on initial evaluation findings, risk stratification, comorbidities and participant's personal goals.       Expected Outcomes Short Term: Increase workloads from initial exercise prescription for resistance, speed, and METs.;Short Term: Perform resistance training exercises routinely during rehab and add in resistance training at home;Long Term: Improve cardiorespiratory fitness, muscular endurance and strength as measured by increased METs and functional capacity ( )       Able to understand and use rate of perceived exertion (RPE) scale Yes       Intervention Provide education and explanation on how to use RPE scale       Expected Outcomes Short Term: Able to use RPE daily in rehab to express subjective intensity level;Long Term:  Able to use RPE to guide intensity level when exercising independently       Knowledge and understanding of Target Heart Rate Range (THRR) Yes       Intervention Provide education and explanation of THRR including how the numbers were predicted and where they are located for reference       Expected Outcomes Short Term: Able to state/look up THRR;Short Term: Able to use daily as guideline for intensity in rehab;Long Term: Able to use THRR to govern intensity when exercising independently       Able to check pulse independently Yes       Intervention Provide education and demonstration on how to check pulse in carotid and radial arteries.;Review the importance of being able to check your own pulse for safety during independent exercise       Expected Outcomes Short Term: Able to explain why pulse checking is important  during independent exercise;Long Term: Able to check pulse independently and accurately       Understanding of Exercise Prescription Yes       Intervention Provide education, explanation, and written materials on patient's individual exercise prescription       Expected Outcomes Short Term: Able to explain program exercise prescription;Long Term: Able to explain home exercise prescription to exercise independently          Exercise Goals Re-Evaluation :  Exercise Goals Re-Evaluation     Row Name 02/10/24 1425             Exercise Goal Re-Evaluation   Exercise Goals Review Increase Physical Activity;Understanding of Exercise Prescription;Increase Strength and Stamina;Knowledge and understanding of Target Heart Rate Range (THRR);Able to understand and use rate of perceived exertion (RPE) scale       Comments Pt's first day in the CRP2 program. Pt understands the exercise Rx, THRR, and RPE scale.       Expected Outcomes Will continue to monitor the patient and will progress the exercise workloads as tolerated.          Discharge Exercise Prescription (Final Exercise Prescription Changes):  Exercise Prescription Changes - 02/10/24 1400  Response to Exercise   Blood Pressure (Admit) 114/72    Blood Pressure (Exercise) 118/76    Blood Pressure (Exit) 112/72    Heart Rate (Admit) 86 bpm    Heart Rate (Exercise) 124 bpm    Heart Rate (Exit) 94 bpm    Rating of Perceived Exertion (Exercise) 13    Symptoms None    Comments Pt's first day in the CRP2 program    Duration Continue with 30 min of aerobic exercise without signs/symptoms of physical distress.    Intensity THRR unchanged      Progression   Progression Continue to progress workloads to maintain intensity without signs/symptoms of physical distress.    Average METs 2.25      Resistance Training   Training Prescription No    Weight No wts on wednesdays      Interval Training   Interval Training No      Recumbant  Bike   Level 1    RPM 71    Watts 15    Minutes 15    METs 2.3      NuStep   Level 1    SPM 84    Minutes 15    METs 2.2          Nutrition:  Target Goals: Understanding of nutrition guidelines, daily intake of sodium 1500mg , cholesterol 200mg , calories 30% from fat and 7% or less from saturated fats, daily to have 5 or more servings of fruits and vegetables.  Biometrics:  Pre Biometrics - 02/02/24 1313       Pre Biometrics   Waist Circumference 43.5 inches    Hip Circumference 46.5 inches    Waist to Hip Ratio 0.94 %    Triceps Skinfold 21.5 mm    % Body Fat 42 %    Grip Strength 17 kg    Flexibility --   not performed,  chronic back pain   Single Leg Stand 1.97 seconds           Nutrition Therapy Plan and Nutrition Goals:   Nutrition Assessments:  MEDIFICTS Score Key: >=70 Need to make dietary changes  40-70 Heart Healthy Diet <= 40 Therapeutic Level Cholesterol Diet   Flowsheet Row INTENSIVE CARDIAC REHAB from 02/10/2024 in [facility] for Heart, Vascular, & Lung Health  Picture Your Plate Total Score on Admission 47   Picture Your Plate Scores: <59 Unhealthy dietary pattern with much room for improvement. 41-50 Dietary pattern unlikely to meet recommendations for good health and room for improvement. 51-60 More healthful dietary pattern, with some room for improvement.  >60 Healthy dietary pattern, although there may be some specific behaviors that could be improved.    Nutrition Goals Re-Evaluation:   Nutrition Goals Re-Evaluation:   Nutrition Goals Discharge (Final Nutrition Goals Re-Evaluation):   Psychosocial: Target Goals: Acknowledge presence or absence of significant depression and/or stress, maximize coping skills, provide positive support system. Participant is able to verbalize types and ability to use techniques and skills needed for reducing stress and depression.  Initial Review & Psychosocial  Screening:  Initial Psych Review & Screening - 02/02/24 1415       Initial Review   Current issues with Current Sleep Concerns      Family Dynamics   Good Support System? Yes    Comments Maurene states she doesnn't have any family, but she has good support from friends. She's content with life. She states she has low motivation because of inablity to do  housework(vacuuming) due to back pain and shortness of breath.      Barriers   Psychosocial barriers to participate in program There are no identifiable barriers or psychosocial needs.      Screening Interventions   Interventions Encouraged to exercise;Provide feedback about the scores to participant    Expected Outcomes Short Term goal: Identification and review with participant of any Quality of Life or Depression concerns found by scoring the questionnaire.;Long Term goal: The participant improves quality of Life and PHQ9 Scores as seen by post scores and/or verbalization of changes          Quality of Life Scores:  Quality of Life - 02/02/24 1613       Quality of Life   Select Quality of Life      Quality of Life Scores   Health/Function Pre 11.88 %    Socioeconomic Pre 24 %    Psych/Spiritual Pre 20.75 %    Family Pre 30 %    GLOBAL Pre 17.16 %         Scores of 19 and below usually indicate a poorer quality of life in these areas.  A difference of  2-3 points is a clinically meaningful difference.  A difference of 2-3 points in the total score of the Quality of Life Index has been associated with significant improvement in overall quality of life, self-image, physical symptoms, and general health in studies assessing change in quality of life.  PHQ-9: Review Flowsheet  More data exists      02/08/2024 02/02/2024 04/17/2022 11/18/2021 04/03/2021  Depression screen PHQ 2/9  Decreased Interest 0 0 0 2 0  Down, Depressed, Hopeless 0 0 1 2 0  PHQ - 2 Score 0 0 1 4 0  Altered sleeping 2 2 - 2 -  Tired, decreased energy 2  2 - 2 -  Change in appetite 0 0 - 2 -  Feeling bad or failure about yourself  0 0 - 0 -  Trouble concentrating 0 1 - 0 -  Moving slowly or fidgety/restless 0 0 - 0 -  Suicidal thoughts 0 0 - 0 -  PHQ-9 Score 4 5 - 10 -  Difficult doing work/chores Very difficult Not difficult at all - Not difficult at all -   Interpretation of Total Score  Total Score Depression Severity:  1-4 = Minimal depression, 5-9 = Mild depression, 10-14 = Moderate depression, 15-19 = Moderately severe depression, 20-27 = Severe depression   Psychosocial Evaluation and Intervention:   Psychosocial Re-Evaluation:  Psychosocial Re-Evaluation     Row Name 02/16/24 1020             Psychosocial Re-Evaluation   Current issues with Current Sleep Concerns       Comments Pt has not voiced any additional psychosocial concerns during exercise at cardiac rehab.       Expected Outcomes Shaiann will have controlled or decreased concerns/stressors upon completion of cardiac rehab.       Interventions Encouraged to attend Cardiac Rehabilitation for the exercise;Relaxation education       Continue Psychosocial Services  No Follow up required          Psychosocial Discharge (Final Psychosocial Re-Evaluation):  Psychosocial Re-Evaluation - 02/16/24 1020       Psychosocial Re-Evaluation   Current issues with Current Sleep Concerns    Comments Pt has not voiced any additional psychosocial concerns during exercise at cardiac rehab.    Expected Outcomes Vesna will have controlled or decreased  concerns/stressors upon completion of cardiac rehab.    Interventions Encouraged to attend Cardiac Rehabilitation for the exercise;Relaxation education    Continue Psychosocial Services  No Follow up required          Vocational Rehabilitation: Provide vocational rehab assistance to qualifying candidates.   Vocational Rehab Evaluation & Intervention:  Vocational Rehab - 02/02/24 1417       Initial Vocational Rehab  Evaluation & Intervention   Assessment shows need for Vocational Rehabilitation No      Vocational Rehab Re-Evaulation   Comments Ashey is on disability due to chronic back pain and shortness of breath. No VR needs at this time.          Education: Education Goals: Education classes will be provided on a weekly basis, covering required topics. Participant will state understanding/return demonstration of topics presented.    Education     Row Name 02/10/24 1300     Education   Cardiac Education Topics Pritikin   Customer Service Manager   Weekly Topic Fast and Healthy Breakfasts   Instruction Review Code 1- Verbalizes Understanding   Class Start Time 1145   Class Stop Time 1225   Class Time Calculation (min) 40 min    Row Name 02/12/24 1100     Education   Cardiac Education Topics Pritikin   Licensed Conveyancer Nutrition   Nutrition Other  Label reading   Instruction Review Code 1- Verbalizes Understanding   Class Start Time 1145   Class Stop Time 1230   Class Time Calculation (min) 45 min    Row Name 02/15/24 1200     Education   Cardiac Education Topics Pritikin   Hospital Doctor Education   General Education Metabolic Syndrome and Belly Fat   Instruction Review Code 1- Verbalizes Understanding   Class Start Time 1150   Class Stop Time 1240   Class Time Calculation (min) 50 min      Core Videos: Exercise    Move It!  Clinical staff conducted group or individual video education with verbal and written material and guidebook.  Patient learns the recommended Pritikin exercise program. Exercise with the goal of living a long, healthy life. Some of the health benefits of exercise include controlled diabetes, healthier blood pressure levels, improved cholesterol levels, improved heart and lung capacity,  improved sleep, and better body composition. Everyone should speak with their doctor before starting or changing an exercise routine.  Biomechanical Limitations Clinical staff conducted group or individual video education with verbal and written material and guidebook.  Patient learns how biomechanical limitations can impact exercise and how we can mitigate and possibly overcome limitations to have an impactful and balanced exercise routine.  Body Composition Clinical staff conducted group or individual video education with verbal and written material and guidebook.  Patient learns that body composition (ratio of muscle mass to fat mass) is a key component to assessing overall fitness, rather than body weight alone. Increased fat mass, especially visceral belly fat, can put us  at increased risk for metabolic syndrome, type 2 diabetes, heart disease, and even death. It is recommended to combine diet and exercise (cardiovascular and resistance training) to improve your body composition. Seek guidance from your physician and exercise physiologist before implementing an exercise routine.  Exercise  Action Plan Clinical staff conducted group or individual video education with verbal and written material and guidebook.  Patient learns the recommended strategies to achieve and enjoy long-term exercise adherence, including variety, self-motivation, self-efficacy, and positive decision making. Benefits of exercise include fitness, good health, weight management, more energy, better sleep, less stress, and overall well-being.  Medical   Heart Disease Risk Reduction Clinical staff conducted group or individual video education with verbal and written material and guidebook.  Patient learns our heart is our most vital organ as it circulates oxygen , nutrients, white blood cells, and hormones throughout the entire body, and carries waste away. Data supports a plant-based eating plan like the Pritikin Program for  its effectiveness in slowing progression of and reversing heart disease. The video provides a number of recommendations to address heart disease.   Metabolic Syndrome and Belly Fat  Clinical staff conducted group or individual video education with verbal and written material and guidebook.  Patient learns what metabolic syndrome is, how it leads to heart disease, and how one can reverse it and keep it from coming back. You have metabolic syndrome if you have 3 of the following 5 criteria: abdominal obesity, high blood pressure, high triglycerides, low HDL cholesterol, and high blood sugar.  Hypertension and Heart Disease Clinical staff conducted group or individual video education with verbal and written material and guidebook.  Patient learns that high blood pressure, or hypertension, is very common in the [place] . Hypertension is largely due to excessive salt intake, but other important risk factors include being overweight, physical inactivity, drinking too much alcohol, smoking, and not eating enough potassium from fruits and vegetables. High blood pressure is a leading risk factor for heart attack, stroke, congestive heart failure, dementia, kidney failure, and premature death. Long-term effects of excessive salt intake include stiffening of the arteries and thickening of heart muscle and organ damage. Recommendations include ways to reduce hypertension and the risk of heart disease.  Diseases of Our Time - Focusing on Diabetes Clinical staff conducted group or individual video education with verbal and written material and guidebook.  Patient learns why the best way to stop diseases of our time is prevention, through food and other lifestyle changes. Medicine (such as prescription pills and surgeries) is often only a Band-Aid on the problem, not a long-term solution. Most common diseases of our time include obesity, type 2 diabetes, hypertension, heart disease, and cancer. The Pritikin  Program is recommended and has been proven to help reduce, reverse, and/or prevent the damaging effects of metabolic syndrome.  Nutrition   Overview of the Pritikin Eating Plan  Clinical staff conducted group or individual video education with verbal and written material and guidebook.  Patient learns about the Pritikin Eating Plan for disease risk reduction. The Pritikin Eating Plan emphasizes a wide variety of unrefined, minimally-processed carbohydrates, like fruits, vegetables, whole grains, and legumes. Go, Caution, and Stop food choices are explained. Plant-based and lean animal proteins are emphasized. Rationale provided for low sodium intake for blood pressure control, low added sugars for blood sugar stabilization, and low added fats and oils for coronary artery disease risk reduction and weight management.  Calorie Density  Clinical staff conducted group or individual video education with verbal and written material and guidebook.  Patient learns about calorie density and how it impacts the Pritikin Eating Plan. Knowing the characteristics of the food you choose will help you decide whether those foods will lead to weight gain or weight loss, and whether you  want to consume more or less of them. Weight loss is usually a side effect of the Pritikin Eating Plan because of its focus on low calorie-dense foods.  Label Reading  Clinical staff conducted group or individual video education with verbal and written material and guidebook.  Patient learns about the Pritikin recommended label reading guidelines and corresponding recommendations regarding calorie density, added sugars, sodium content, and whole grains.  Dining Out - Part 1  Clinical staff conducted group or individual video education with verbal and written material and guidebook.  Patient learns that restaurant meals can be sabotaging because they can be so high in calories, fat, sodium, and/or sugar. Patient learns recommended  strategies on how to positively address this and avoid unhealthy pitfalls.  Facts on Fats  Clinical staff conducted group or individual video education with verbal and written material and guidebook.  Patient learns that lifestyle modifications can be just as effective, if not more so, as many medications for lowering your risk of heart disease. A Pritikin lifestyle can help to reduce your risk of inflammation and atherosclerosis (cholesterol build-up, or plaque, in the artery walls). Lifestyle interventions such as dietary choices and physical activity address the cause of atherosclerosis. A review of the types of fats and their impact on blood cholesterol levels, along with dietary recommendations to reduce fat intake is also included.  Nutrition Action Plan  Clinical staff conducted group or individual video education with verbal and written material and guidebook.  Patient learns how to incorporate Pritikin recommendations into their lifestyle. Recommendations include planning and keeping personal health goals in mind as an important part of their success.  Healthy Mind-Set    Healthy Minds, Bodies, Hearts  Clinical staff conducted group or individual video education with verbal and written material and guidebook.  Patient learns how to identify when they are stressed. Video will discuss the impact of that stress, as well as the many benefits of stress management. Patient will also be introduced to stress management techniques. The way we think, act, and feel has an impact on our hearts.  How Our Thoughts Can Heal Our Hearts  Clinical staff conducted group or individual video education with verbal and written material and guidebook.  Patient learns that negative thoughts can cause depression and anxiety. This can result in negative lifestyle behavior and serious health problems. Cognitive behavioral therapy is an effective method to help control our thoughts in order to change and improve our  emotional outlook.  Additional Videos:  Exercise    Improving Performance  Clinical staff conducted group or individual video education with verbal and written material and guidebook.  Patient learns to use a non-linear approach by alternating intensity levels and lengths of time spent exercising to help burn more calories and lose more body fat. Cardiovascular exercise helps improve heart health, metabolism, hormonal balance, blood sugar control, and recovery from fatigue. Resistance training improves strength, endurance, balance, coordination, reaction time, metabolism, and muscle mass. Flexibility exercise improves circulation, posture, and balance. Seek guidance from your physician and exercise physiologist before implementing an exercise routine and learn your capabilities and proper form for all exercise.  Introduction to Yoga  Clinical staff conducted group or individual video education with verbal and written material and guidebook.  Patient learns about yoga, a discipline of the coming together of mind, breath, and body. The benefits of yoga include improved flexibility, improved range of motion, better posture and core strength, increased lung function, weight loss, and positive self-image. Yoga's heart health  benefits include lowered blood pressure, healthier heart rate, decreased cholesterol and triglyceride levels, improved immune function, and reduced stress. Seek guidance from your physician and exercise physiologist before implementing an exercise routine and learn your capabilities and proper form for all exercise.  Medical   Aging: Enhancing Your Quality of Life  Clinical staff conducted group or individual video education with verbal and written material and guidebook.  Patient learns key strategies and recommendations to stay in good physical health and enhance quality of life, such as prevention strategies, having an advocate, securing a Health Care Proxy and Power of Attorney,  and keeping a list of medications and system for tracking them. It also discusses how to avoid risk for bone loss.  Biology of Weight Control  Clinical staff conducted group or individual video education with verbal and written material and guidebook.  Patient learns that weight gain occurs because we consume more calories than we burn (eating more, moving less). Even if your body weight is normal, you may have higher ratios of fat compared to muscle mass. Too much body fat puts you at increased risk for cardiovascular disease, heart attack, stroke, type 2 diabetes, and obesity-related cancers. In addition to exercise, following the Pritikin Eating Plan can help reduce your risk.  Decoding Lab Results  Clinical staff conducted group or individual video education with verbal and written material and guidebook.  Patient learns that lab test reflects one measurement whose values change over time and are influenced by many factors, including medication, stress, sleep, exercise, food, hydration, pre-existing medical conditions, and more. It is recommended to use the knowledge from this video to become more involved with your lab results and evaluate your numbers to speak with your doctor.   Diseases of Our Time - Overview  Clinical staff conducted group or individual video education with verbal and written material and guidebook.  Patient learns that according to the CDC, 50% to 70% of chronic diseases (such as obesity, type 2 diabetes, elevated lipids, hypertension, and heart disease) are avoidable through lifestyle improvements including healthier food choices, listening to satiety cues, and increased physical activity.  Sleep Disorders Clinical staff conducted group or individual video education with verbal and written material and guidebook.  Patient learns how good quality and duration of sleep are important to overall health and well-being. Patient also learns about sleep disorders and how they  impact health along with recommendations to address them, including discussing with a physician.  Nutrition  Dining Out - Part 2 Clinical staff conducted group or individual video education with verbal and written material and guidebook.  Patient learns how to plan ahead and communicate in order to maximize their dining experience in a healthy and nutritious manner. Included are recommended food choices based on the type of restaurant the patient is visiting.   Fueling a Banker conducted group or individual video education with verbal and written material and guidebook.  There is a strong connection between our food choices and our health. Diseases like obesity and type 2 diabetes are very prevalent and are in large-part due to lifestyle choices. The Pritikin Eating Plan provides plenty of food and hunger-curbing satisfaction. It is easy to follow, affordable, and helps reduce health risks.  Menu Workshop  Clinical staff conducted group or individual video education with verbal and written material and guidebook.  Patient learns that restaurant meals can sabotage health goals because they are often packed with calories, fat, sodium, and sugar. Recommendations include strategies to  plan ahead and to communicate with the manager, chef, or server to help order a healthier meal.  Planning Your Eating Strategy  Clinical staff conducted group or individual video education with verbal and written material and guidebook.  Patient learns about the Pritikin Eating Plan and its benefit of reducing the risk of disease. The Pritikin Eating Plan does not focus on calories. Instead, it emphasizes high-quality, nutrient-rich foods. By knowing the characteristics of the foods, we choose, we can determine their calorie density and make informed decisions.  Targeting Your Nutrition Priorities  Clinical staff conducted group or individual video education with verbal and written material and  guidebook.  Patient learns that lifestyle habits have a tremendous impact on disease risk and progression. This video provides eating and physical activity recommendations based on your personal health goals, such as reducing LDL cholesterol, losing weight, preventing or controlling type 2 diabetes, and reducing high blood pressure.  Vitamins and Minerals  Clinical staff conducted group or individual video education with verbal and written material and guidebook.  Patient learns different ways to obtain key vitamins and minerals, including through a recommended healthy diet. It is important to discuss all supplements you take with your doctor.   Healthy Mind-Set    Smoking Cessation  Clinical staff conducted group or individual video education with verbal and written material and guidebook.  Patient learns that cigarette smoking and tobacco addiction pose a serious health risk which affects millions of people. Stopping smoking will significantly reduce the risk of heart disease, lung disease, and many forms of cancer. Recommended strategies for quitting are covered, including working with your doctor to develop a successful plan.  Culinary   Becoming a Set Designer conducted group or individual video education with verbal and written material and guidebook.  Patient learns that cooking at home can be healthy, cost-effective, quick, and puts them in control. Keys to cooking healthy recipes will include looking at your recipe, assessing your equipment needs, planning ahead, making it simple, choosing cost-effective seasonal ingredients, and limiting the use of added fats, salts, and sugars.  Cooking - Breakfast and Snacks  Clinical staff conducted group or individual video education with verbal and written material and guidebook.  Patient learns how important breakfast is to satiety and nutrition through the entire day. Recommendations include key foods to eat during breakfast to  help stabilize blood sugar levels and to prevent overeating at meals later in the day. Planning ahead is also a key component.  Cooking - Educational Psychologist conducted group or individual video education with verbal and written material and guidebook.  Patient learns eating strategies to improve overall health, including an approach to cook more at home. Recommendations include thinking of animal protein as a side on your plate rather than center stage and focusing instead on lower calorie dense options like vegetables, fruits, whole grains, and plant-based proteins, such as beans. Making sauces in large quantities to freeze for later and leaving the skin on your vegetables are also recommended to maximize your experience.  Cooking - Healthy Salads and Dressing Clinical staff conducted group or individual video education with verbal and written material and guidebook.  Patient learns that vegetables, fruits, whole grains, and legumes are the foundations of the Pritikin Eating Plan. Recommendations include how to incorporate each of these in flavorful and healthy salads, and how to create homemade salad dressings. Proper handling of ingredients is also covered. Cooking - Soups and [employer] -  Soups and Desserts Clinical staff conducted group or individual video education with verbal and written material and guidebook.  Patient learns that Pritikin soups and desserts make for easy, nutritious, and delicious snacks and meal components that are low in sodium, fat, sugar, and calorie density, while high in vitamins, minerals, and filling fiber. Recommendations include simple and healthy ideas for soups and desserts.   Overview     The Pritikin Solution Program Overview Clinical staff conducted group or individual video education with verbal and written material and guidebook.  Patient learns that the results of the Pritikin Program have been documented in more than 100 articles  published in peer-reviewed journals, and the benefits include reducing risk factors for (and, in some cases, even reversing) high cholesterol, high blood pressure, type 2 diabetes, obesity, and more! An overview of the three key pillars of the Pritikin Program will be covered: eating well, doing regular exercise, and having a healthy mind-set.  WORKSHOPS  Exercise: Exercise Basics: Building Your Action Plan Clinical staff led group instruction and group discussion with PowerPoint presentation and patient guidebook. To enhance the learning environment the use of posters, models and videos may be added. At the conclusion of this workshop, patients will comprehend the difference between physical activity and exercise, as well as the benefits of incorporating both, into their routine. Patients will understand the FITT (Frequency, Intensity, Time, and Type) principle and how to use it to build an exercise action plan. In addition, safety concerns and other considerations for exercise and cardiac rehab will be addressed by the presenter. The purpose of this lesson is to promote a comprehensive and effective weekly exercise routine in order to improve patients' overall level of fitness.   Managing Heart Disease: Your Path to a Healthier Heart Clinical staff led group instruction and group discussion with PowerPoint presentation and patient guidebook. To enhance the learning environment the use of posters, models and videos may be added.At the conclusion of this workshop, patients will understand the anatomy and physiology of the heart. Additionally, they will understand how Pritikin's three pillars impact the risk factors, the progression, and the management of heart disease.  The purpose of this lesson is to provide a high-level overview of the heart, heart disease, and how the Pritikin lifestyle positively impacts risk factors.  Exercise Biomechanics Clinical staff led group instruction and group  discussion with PowerPoint presentation and patient guidebook. To enhance the learning environment the use of posters, models and videos may be added. Patients will learn how the structural parts of their bodies function and how these functions impact their daily activities, movement, and exercise. Patients will learn how to promote a neutral spine, learn how to manage pain, and identify ways to improve their physical movement in order to promote healthy living. The purpose of this lesson is to expose patients to common physical limitations that impact physical activity. Participants will learn practical ways to adapt and manage aches and pains, and to minimize their effect on regular exercise. Patients will learn how to maintain good posture while sitting, walking, and lifting.  Balance Training and Fall Prevention  Clinical staff led group instruction and group discussion with PowerPoint presentation and patient guidebook. To enhance the learning environment the use of posters, models and videos may be added. At the conclusion of this workshop, patients will understand the importance of their sensorimotor skills (vision, proprioception, and the vestibular system) in maintaining their ability to balance as they age. Patients will apply a variety of balancing  exercises that are appropriate for their current level of function. Patients will understand the common causes for poor balance, possible solutions to these problems, and ways to modify their physical environment in order to minimize their fall risk. The purpose of this lesson is to teach patients about the importance of maintaining balance as they age and ways to minimize their risk of falling.  WORKSHOPS   Nutrition:  Fueling a Ship Broker led group instruction and group discussion with PowerPoint presentation and patient guidebook. To enhance the learning environment the use of posters, models and videos may be added.  Patients will review the foundational principles of the Pritikin Eating Plan and understand what constitutes a serving size in each of the food groups. Patients will also learn Pritikin-friendly foods that are better choices when away from home and review make-ahead meal and snack options. Calorie density will be reviewed and applied to three nutrition priorities: weight maintenance, weight loss, and weight gain. The purpose of this lesson is to reinforce (in a group setting) the key concepts around what patients are recommended to eat and how to apply these guidelines when away from home by planning and selecting Pritikin-friendly options. Patients will understand how calorie density may be adjusted for different weight management goals.  Mindful Eating  Clinical staff led group instruction and group discussion with PowerPoint presentation and patient guidebook. To enhance the learning environment the use of posters, models and videos may be added. Patients will briefly review the concepts of the Pritikin Eating Plan and the importance of low-calorie dense foods. The concept of mindful eating will be introduced as well as the importance of paying attention to internal hunger signals. Triggers for non-hunger eating and techniques for dealing with triggers will be explored. The purpose of this lesson is to provide patients with the opportunity to review the basic principles of the Pritikin Eating Plan, discuss the value of eating mindfully and how to measure internal cues of hunger and fullness using the Hunger Scale. Patients will also discuss reasons for non-hunger eating and learn strategies to use for controlling emotional eating.  Targeting Your Nutrition Priorities Clinical staff led group instruction and group discussion with PowerPoint presentation and patient guidebook. To enhance the learning environment the use of posters, models and videos may be added. Patients will learn how to determine their  genetic susceptibility to disease by reviewing their family history. Patients will gain insight into the importance of diet as part of an overall healthy lifestyle in mitigating the impact of genetics and other environmental insults. The purpose of this lesson is to provide patients with the opportunity to assess their personal nutrition priorities by looking at their family history, their own health history and current risk factors. Patients will also be able to discuss ways of prioritizing and modifying the Pritikin Eating Plan for their highest risk areas  Menu  Clinical staff led group instruction and group discussion with PowerPoint presentation and patient guidebook. To enhance the learning environment the use of posters, models and videos may be added. Using menus brought in from [employer], or printed from [employer], patients will apply the Pritikin dining out guidelines that were presented in the [employer] video. Patients will also be able to practice these guidelines in a variety of provided scenarios. The purpose of this lesson is to provide patients with the opportunity to practice hands-on learning of the Pritikin Dining Out guidelines with actual menus and practice scenarios.  Label Reading  Clinical staff led group instruction and group discussion with PowerPoint presentation and patient guidebook. To enhance the learning environment the use of posters, models and videos may be added. Patients will review and discuss the Pritikin label reading guidelines presented in Pritikin's Label Reading Educational series video. Using fool labels brought in from local grocery stores and markets, patients will apply the label reading guidelines and determine if the packaged food meet the Pritikin guidelines. The purpose of this lesson is to provide patients with the opportunity to review, discuss, and practice hands-on learning of the Pritikin Label Reading guidelines with  actual packaged food labels. Cooking School  Pritikin's [employer] are designed to teach patients ways to prepare quick, simple, and affordable recipes at home. The importance of nutrition's role in chronic disease risk reduction is reflected in its emphasis in the overall Pritikin program. By learning how to prepare essential core Pritikin Eating Plan recipes, patients will increase control over what they eat; be able to customize the flavor of foods without the use of added salt, sugar, or fat; and improve the quality of the food they consume. By learning a set of core recipes which are easily assembled, quickly prepared, and affordable, patients are more likely to prepare more healthy foods at home. These workshops focus on convenient breakfasts, simple entres, side dishes, and desserts which can be prepared with minimal effort and are consistent with nutrition recommendations for cardiovascular risk reduction. Cooking [employer] are taught by a armed forces logistics/support/administrative officer (RD) who has been trained by the [employer]. The chef or RD has a clear understanding of the importance of minimizing - if not completely eliminating - added fat, sugar, and sodium in recipes. Throughout the series of Cooking School Workshop sessions, patients will learn about healthy ingredients and efficient methods of cooking to build confidence in their capability to prepare    Cooking School weekly topics:  Adding Flavor- Sodium-Free  Fast and Healthy Breakfasts  Powerhouse Plant-Based Proteins  Satisfying Salads and Dressings  Simple Sides and Sauces  International Cuisine-Spotlight on the [employer] Zones  Delicious Desserts  Savory Soups  [employer] - Meals in a Astronomer Appetizers and Snacks  Comforting Weekend Breakfasts  One-Pot Wonders   Fast Evening Meals  Landscape Architect Your Pritikin Plate  WORKSHOPS   Healthy Mindset (Psychosocial):  Focused  Goals, Sustainable Changes Clinical staff led group instruction and group discussion with PowerPoint presentation and patient guidebook. To enhance the learning environment the use of posters, models and videos may be added. Patients will be able to apply effective goal setting strategies to establish at least one personal goal, and then take consistent, meaningful action toward that goal. They will learn to identify common barriers to achieving personal goals and develop strategies to overcome them. Patients will also gain an understanding of how our mind-set can impact our ability to achieve goals and the importance of cultivating a positive and growth-oriented mind-set. The purpose of this lesson is to provide patients with a deeper understanding of how to set and achieve personal goals, as well as the tools and strategies needed to overcome common obstacles which may arise along the way.  From Head to Heart: The Power of a Healthy Outlook  Clinical staff led group instruction and group discussion with PowerPoint presentation and patient guidebook. To enhance the learning environment the use of posters, models and videos may be added. Patients will be able to recognize and describe the  impact of emotions and mood on physical health. They will discover the importance of self-care and explore self-care practices which may work for them. Patients will also learn how to utilize the 4 C's to cultivate a healthier outlook and better manage stress and challenges. The purpose of this lesson is to demonstrate to patients how a healthy outlook is an essential part of maintaining good health, especially as they continue their cardiac rehab journey.  Healthy Sleep for a Healthy Heart Clinical staff led group instruction and group discussion with PowerPoint presentation and patient guidebook. To enhance the learning environment the use of posters, models and videos may be added. At the conclusion of this workshop,  patients will be able to demonstrate knowledge of the importance of sleep to overall health, well-being, and quality of life. They will understand the symptoms of, and treatments for, common sleep disorders. Patients will also be able to identify daytime and nighttime behaviors which impact sleep, and they will be able to apply these tools to help manage sleep-related challenges. The purpose of this lesson is to provide patients with a general overview of sleep and outline the importance of quality sleep. Patients will learn about a few of the most common sleep disorders. Patients will also be introduced to the concept of "sleep hygiene," and discover ways to self-manage certain sleeping problems through simple daily behavior changes. Finally, the workshop will motivate patients by clarifying the links between quality sleep and their goals of heart-healthy living.   Recognizing and Reducing Stress Clinical staff led group instruction and group discussion with PowerPoint presentation and patient guidebook. To enhance the learning environment the use of posters, models and videos may be added. At the conclusion of this workshop, patients will be able to understand the types of stress reactions, differentiate between acute and chronic stress, and recognize the impact that chronic stress has on their health. They will also be able to apply different coping mechanisms, such as reframing negative self-talk. Patients will have the opportunity to practice a variety of stress management techniques, such as deep abdominal breathing, progressive muscle relaxation, and/or guided imagery.  The purpose of this lesson is to educate patients on the role of stress in their lives and to provide healthy techniques for coping with it.  Learning Barriers/Preferences:  Learning Barriers/Preferences - 02/02/24 1417       Learning Barriers/Preferences   Learning Barriers None    Learning Preferences Computer/Internet;Group  Instruction;Individual Instruction;Pictoral;Skilled Demonstration;Verbal Instruction;Video;Written Material;Audio          Education Topics:  Knowledge Questionnaire Score:  Knowledge Questionnaire Score - 02/02/24 1418       Knowledge Questionnaire Score   Pre Score 24/24          Core Components/Risk Factors/Patient Goals at Admission:  Personal Goals and Risk Factors at Admission - 02/02/24 1419       Core Components/Risk Factors/Patient Goals on Admission   Lipids Yes    Intervention Provide education and support for participant on nutrition & aerobic/resistive exercise along with prescribed medications to achieve LDL 70mg , HDL >40mg .    Expected Outcomes Short Term: Participant states understanding of desired cholesterol values and is compliant with medications prescribed. Participant is following exercise prescription and nutrition guidelines.;Long Term: Cholesterol controlled with medications as prescribed, with individualized exercise RX and with personalized nutrition plan. Value goals: LDL < 70mg , HDL > 40 mg.          Core Components/Risk Factors/Patient Goals Review:   Goals and Risk Factor  Review     Row Name 02/16/24 1023             Core Components/Risk Factors/Patient Goals Review   Personal Goals Review Lipids       Review Salene is doing well with exercise at cardiac rehab. Vital signs and CBG's have been stable. Garielle has only had one week of CR sessions since orientation MET levels were obtained and is maintaining these at this time.       Expected Outcomes Isbella will continue to particpate in cardiac rehab for exercise nutriton and lifestyle modifications.          Core Components/Risk Factors/Patient Goals at Discharge (Final Review):   Goals and Risk Factor Review - 02/16/24 1023       Core Components/Risk Factors/Patient Goals Review   Personal Goals Review Lipids    Review Addy is doing well with exercise at cardiac rehab. Vital  signs and CBG's have been stable. Janique has only had one week of CR sessions since orientation MET levels were obtained and is maintaining these at this time.    Expected Outcomes Sion will continue to particpate in cardiac rehab for exercise nutriton and lifestyle modifications.          ITP Comments:  ITP Comments     Row Name 02/02/24 1315 02/10/24 1407         ITP Comments Wilbert Bihari, MD: Medical Director. Introduction to the Pritikin Education Program/Intensive Cardiac Rehab. Initial orientaion packet reviewed with the patient. 30 Day ITP review.  Shantese began cardiac rehab today (02/10/24) and tolerated exercise well.         Comments: see ITP comments

## 2024-02-17 ENCOUNTER — Encounter (HOSPITAL_COMMUNITY)
Admission: RE | Admit: 2024-02-17 | Discharge: 2024-02-17 | Disposition: A | Discharge status: Home / Self Care | Source: Ambulatory Visit | Attending: Cardiology | Admitting: Cardiology

## 2024-02-17 DIAGNOSIS — Z48812 Encounter for surgical aftercare following surgery on the circulatory system: Secondary | ICD-10-CM | POA: Diagnosis not present

## 2024-02-17 DIAGNOSIS — Z955 Presence of coronary angioplasty implant and graft: Secondary | ICD-10-CM

## 2024-02-18 ENCOUNTER — Ambulatory Visit (HOSPITAL_BASED_OUTPATIENT_CLINIC_OR_DEPARTMENT_OTHER)

## 2024-02-18 DIAGNOSIS — R0609 Other forms of dyspnea: Secondary | ICD-10-CM

## 2024-02-18 DIAGNOSIS — J449 Chronic obstructive pulmonary disease, unspecified: Secondary | ICD-10-CM

## 2024-02-18 LAB — PULMONARY FUNCTION TEST
DL/VA % pred: 131 %
DL/VA: 5.49 ml/min/mmHg/L
DLCO cor % pred: 91 %
DLCO cor: 17.36 ml/min/mmHg
DLCO unc % pred: 92 %
DLCO unc: 17.46 ml/min/mmHg
FEF 25-75 Post: 0.55 L/s
FEF 25-75 Pre: 0.37 L/s
FEF2575-%Change-Post: 49 %
FEF2575-%Pred-Post: 28 %
FEF2575-%Pred-Pre: 18 %
FEV1-%Change-Post: 9 %
FEV1-%Pred-Post: 44 %
FEV1-%Pred-Pre: 40 %
FEV1-Post: 0.99 L
FEV1-Pre: 0.9 L
FEV1FVC-%Change-Post: 4 %
FEV1FVC-%Pred-Pre: 64 %
FEV6-%Change-Post: 6 %
FEV6-%Pred-Post: 66 %
FEV6-%Pred-Pre: 62 %
FEV6-Post: 1.87 L
FEV6-Pre: 1.76 L
FEV6FVC-%Change-Post: 1 %
FEV6FVC-%Pred-Post: 102 %
FEV6FVC-%Pred-Pre: 101 %
FVC-%Change-Post: 4 %
FVC-%Pred-Post: 64 %
FVC-%Pred-Pre: 61 %
FVC-Post: 1.91 L
FVC-Pre: 1.82 L
Post FEV1/FVC ratio: 52 %
Post FEV6/FVC ratio: 98 %
Pre FEV1/FVC ratio: 49 %
Pre FEV6/FVC Ratio: 97 %
RV % pred: 185 %
RV: 3.88 L
TLC % pred: 122 %
TLC: 6.01 L

## 2024-02-18 NOTE — Progress Notes (Signed)
 Full PFT performed today.

## 2024-02-18 NOTE — Patient Instructions (Signed)
 Full PFT performed today.

## 2024-02-19 ENCOUNTER — Other Ambulatory Visit (HOSPITAL_COMMUNITY): Payer: Self-pay

## 2024-02-19 ENCOUNTER — Encounter (HOSPITAL_COMMUNITY): Admission: RE | Admit: 2024-02-19 | Source: Ambulatory Visit

## 2024-02-19 NOTE — Telephone Encounter (Signed)
 Copied from CRM [id number]. Topic: Appointments - Scheduling Inquiry for Clinic >> Feb 19, 2024  2:08 PM Grace [name] wrote: Reason for CRM: Patient missed a call and is requesting a call back from [facility] as the patient needs to be seen sooner than 12/9 per her recent testing results.

## 2024-02-22 ENCOUNTER — Other Ambulatory Visit (HOSPITAL_COMMUNITY): Payer: Self-pay

## 2024-02-22 ENCOUNTER — Encounter: Payer: Self-pay | Admitting: Nurse Practitioner

## 2024-02-22 ENCOUNTER — Encounter (HOSPITAL_COMMUNITY)
Admission: RE | Admit: 2024-02-22 | Discharge: 2024-02-22 | Disposition: A | Source: Ambulatory Visit | Attending: Cardiology

## 2024-02-22 ENCOUNTER — Other Ambulatory Visit: Payer: Self-pay | Admitting: Cardiology

## 2024-02-22 ENCOUNTER — Other Ambulatory Visit: Payer: Self-pay | Admitting: Medical Genetics

## 2024-02-22 DIAGNOSIS — Z955 Presence of coronary angioplasty implant and graft: Secondary | ICD-10-CM | POA: Diagnosis not present

## 2024-02-22 DIAGNOSIS — Z006 Encounter for examination for normal comparison and control in clinical research program: Secondary | ICD-10-CM

## 2024-02-22 DIAGNOSIS — Z48812 Encounter for surgical aftercare following surgery on the circulatory system: Secondary | ICD-10-CM | POA: Diagnosis not present

## 2024-02-22 NOTE — Progress Notes (Addendum)
 Pt observed to have a resting HR in the 120s when she came in for CR today. Denies CP, jaw pain, but did state that sometimes my heart does have funny rhythms. Endorsed CP on Thursday night and having taken an extra aspirin  but that it did nothing for her pain and that I thought it might have been GERD. ECG obtained which shows NSR. Upon recheck 156/80, 79 HR, 100% on RA.    Telemetry strips in question given to onsite provider, who stated that Grace [name] did not appear to be in atrial fibrillation.  Upon leaving treatment room, Grace [name]'s HR jumped back up to 134, but denies SOB and denies palpitations. Provider notified and instructed to have pt contact her cardiologist to see if they would like to put her on a Zio monitor.  Educated pt that if she has chest pain, NTG is what she is supposed to take, not aspirin , and that if she has any recurring palpitations, to also contact her cardiologist.

## 2024-02-22 NOTE — Progress Notes (Signed)
 Incomplete Session Note  Patient Details  Name: Grace [name] MRN: [id number] Date of Birth: Apr 11, 1956 Referring Provider:   Flowsheet Row INTENSIVE CARDIAC REHAB ORIENT from 02/02/2024 in [facility]  Referring Provider Anner Alm ORN, MD    Grace [name] did not complete her rehab session.  Pt observed to have a resting HR in the 120s when she came in for CR today. Denies CP, jaw pain, but did state that sometimes my heart does have funny rhythms. Endorsed CP on Thursday night and having taken an extra aspirin  but that it did nothing for my pain and that I thought it might have been GERD. ECG obtained which shows NSR. Upon recheck 156/80, 79 HR, 100% on RA.     Telemetry strips in question given to onsite provider, who stated that Donald did not appear to be in atrial fibrillation.  Upon leaving treatment room, Grace [name]'s HR jumped back up to 134, but denies SOB and denies palpitations. Provider notified and instructed to have pt contact her cardiologist to see if they would like to put her on a Zio monitor.  Educated pt that if she has chest pain, NTG is what she is supposed to take, not aspirin , and that if she has any recurring palpitations, to also contact her cardiologist.

## 2024-02-24 ENCOUNTER — Encounter (HOSPITAL_COMMUNITY): Admission: RE | Admit: 2024-02-24 | Source: Ambulatory Visit

## 2024-02-24 ENCOUNTER — Other Ambulatory Visit (HOSPITAL_COMMUNITY): Payer: Self-pay

## 2024-02-24 MED ORDER — PANTOPRAZOLE SODIUM 40 MG PO TBEC: 40.0000 mg | DELAYED_RELEASE_TABLET | Freq: Two times a day (BID) | ORAL | 2 refills | Status: AC

## 2024-02-26 ENCOUNTER — Encounter (HOSPITAL_COMMUNITY): Admission: RE | Admit: 2024-02-26 | Source: Ambulatory Visit

## 2024-02-26 ENCOUNTER — Ambulatory Visit (INDEPENDENT_AMBULATORY_CARE_PROVIDER_SITE_OTHER): Admitting: Nurse Practitioner

## 2024-02-26 ENCOUNTER — Encounter: Payer: Self-pay | Admitting: Nurse Practitioner

## 2024-02-26 VITALS — BP 118/74 | HR 89 | Temp 97.9°F | Ht 63.0 in | Wt 156.8 lb

## 2024-02-26 DIAGNOSIS — Z87891 Personal history of nicotine dependence: Secondary | ICD-10-CM | POA: Diagnosis not present

## 2024-02-26 DIAGNOSIS — J449 Chronic obstructive pulmonary disease, unspecified: Secondary | ICD-10-CM

## 2024-02-26 DIAGNOSIS — G4733 Obstructive sleep apnea (adult) (pediatric): Secondary | ICD-10-CM | POA: Diagnosis not present

## 2024-02-26 DIAGNOSIS — D649 Anemia, unspecified: Secondary | ICD-10-CM

## 2024-02-26 NOTE — Patient Instructions (Addendum)
 Continue Breztri  2 puffs Twice daily. Brush tongue and rinse mouth afterwards Continue Albuterol  inhaler 2 puffs every 6 hours as needed for shortness of breath or wheezing. Notify if symptoms persist despite rescue inhaler/neb use.  Continue flonase  nasal spray 2 sprays each nostril daily  Start CPAP 5-15 cmH2O, nasal mask of choice, every night, minimum of 4-6 hours a night.  Change equipment as directed. Wash your tubing with warm soap and water  daily, hang to dry. Wash humidifier portion weekly. Use bottled, distilled water  and change daily Be aware of reduced alertness and do not drive or operate heavy machinery if experiencing this or drowsiness.  Exercise encouraged, as tolerated. Healthy weight management discussed.  Avoid or decrease alcohol consumption and medications that make you more sleepy, if possible. Notify if persistent daytime sleepiness occurs even with consistent use of PAP therapy.  Change CPAP supplies... Every month Mask cushions and/or nasal pillows CPAP machine filters Every 3 months Mask frame (not including the headgear) CPAP tubing Every 6 months Mask headgear Chin strap (if applicable) Humidifier water  tub  We discussed how untreated sleep apnea puts an individual at risk for cardiac arrhthymias, pulm HTN, DM, stroke and increases their risk for daytime accidents. We also briefly reviewed treatment options including weight loss, side sleeping position, oral appliance, CPAP therapy or referral to ENT for possible surgical options. Given the severity of your sleep apnea, the recommendation is for you to start CPAP therapy  Follow up in 10-12 weeks with Dr. Neda or Dr. Theodoro (new 30 min slot) to establish care and f/u on new CPAP and COPD. If symptoms do not improve or worsen, please contact office for sooner follow up or seek emergency care.

## 2024-02-26 NOTE — Progress Notes (Signed)
 @Patient  ID: Grace [name], female    DOB: [birth year], 68 y.o.   MRN: [id number]  Chief Complaint  Patient presents with   Follow-up    Review HST and PFT    Referring provider: Nedra Tinnie LABOR, NP  HPI: 68 year old female, former smoker followed for COPD mixed type. She is a former patient of Dr. Harriet and last seen in office 02/26/2023 by Malachy NP. Past medical history significant for migraines, PFO, HTN, OSA on CPAP, GERD, Barrett's esophagus, hypothyroid, multinodular goiter, DM, HLD, obesity, insomnia, HSV.  TEST/EVENTS:  01/04/2019 HST: AHI 29.4 03/30/2020 PFT: FVC 56, FEV1 40, ratio 47, TLC 103, DLCO 95 09/29/2022 LDCT chest lung cancer screen: Atherosclerosis.  No suspicious appearing pulmonary nodules or masses.  Mild diffuse bronchial wall thickening with very mild emphysema. 09/30/2023 LDCT chest: atherosclerosis. Lower lobe bronchial thickening and mild centrilobular emphysema. RML scarring. LUL mucoid impaction. No suspicious nodules/masses. Lung RADS 1 01/17/2024 HST: AHI 36/h, SpO2 low 84% >> weight documented as 182 lb from hx but in Epic records, weight recorded at 162 lb which pt states is accurate  02/18/2024 PFT: FVC 61, FEV1 40, ratio 49, DLC 122, DLCO 92  10/05/2020: OV with Hope NP. For 6 month follow up. Doing really well, breathing has improved. Exercising and working on weight loss. Able to do more. Rarely requires her rescue inhaler. Compliant with Trelegy. Enrolled in randomized double blinded study for RSV vaccine. Received injections 2 days ago. She had fever and headache yesterday. Reported her symptoms to the study. Goes back for follow up in 2 weeks. Repeat PFT I 6 months. Resume lung cancer screening with LDCT annually.   01/09/2023: OV with Sandra Tellefsen NP for acute visit.  She had COVID the end of August.  Did not receive an antiviral.  She saw her PCP a week or 2 later and was treated with Z-Pak and prednisone .  Unfortunately, has continued to have difficulties  with productive cough, shortness of breath and feeling more fatigued.  She is also noticing an occasional wheeze.  Has a lot of sinus congestion and drainage.  Does have some facial tenderness.  Sputum is yellow to green.  Denies any fevers, chills, hemoptysis, increased lower extremity swelling, orthopnea.  She uses Trelegy daily.  Using her rescue inhaler a few times a day. Off CPAP right now due to lack of supplies.   02/26/2023: Ov with Ryer Asato NP Patient presents today for follow up.  She had had COVID the end of August with unresolved AECOPD and acute sinusitis.  We treated her with empiric Augmentin  and extended prednisone  taper.  Chest x-ray without superimposed infection.  Her cough has significantly improved.  She was actually feeling much better overall up until the last week or so.  She feels like she is just more short winded than she has been.  It took her pretty much all weekend to clean her house because she kept having to stop to rest.  She is not noticing any wheezing.  She still having sinus congestion.  Face does not feel tender anymore.  Mucus is white to yellow.  No fevers, chills, hemoptysis.  She is having some increased lower extremity swelling and does find a little more difficult to lay flat at night.  She is using her Trelegy daily.  Using her rescue a few times a week.  Has not restarted CPAP. Does not take fluid pill daily.   02/26/2024: Today - follow up Discussed the use of AI  scribe software for clinical note transcription with the patient, who gave verbal consent to proceed.  History of Present Illness Grace [name] is a 68 year old female with COPD and sleep apnea who presents for follow-up.  She has severe obstructive sleep apnea with approximately 36 events per hour. Despite significant weight loss, the condition persists. She has a CPAP machine but it is old and she is not currently using it. She would like to resume therapy.   Her PFT today was stable to improved  when compared to prior. Her breathing has improved with the weight loss. Has some days that she has a little trouble but other days she does fine. She uses Breztri  regularly and albuterol  as needed, averaging a couple of times a week. No current cough. No wheezing, chest congestion, leg swelling, chest congestion, PND.   She reports ongoing issues with gastrointestinal bleeding, which has led to changes in her medication regimen. She was taken off Goody Powders and started on tramadol , which she finds ineffective compared to her previous medication but understands she can't use the [employer] anymore.   She has a history of cardiac issues, including a catheterization with four stents placed, followed by two additional stents. She is currently participating in cardiac rehab. No CP. She quit smoking in 2017 and is part of a lung cancer screening program, with her last screening in June showing no issues.     Allergies  Allergen Reactions   Amlodipine      Cramps    Codeine     Headaches    Gabapentin      headache   Prozac [Fluoxetine Hcl] Other (See Comments)    Suicidal ideations    Immunization History  Administered Date(s) Administered   INFLUENZA, HIGH DOSE SEASONAL PF 01/03/2024   Influenza Split 04/25/2011   Influenza Whole 02/06/2009   Influenza, Seasonal, Injecte, Preservative Fre 05/20/2012   Influenza,inj,Quad PF,6+ Mos 03/15/2013, 01/12/2015, 01/25/2016, 12/23/2016, 01/22/2018, 01/26/2019   Influenza-Unspecified 12/13/2013, 02/21/2020   PFIZER Comirnaty(Gray Top)Covid-19 Tri-Sucrose Vaccine 08/03/2020   PFIZER(Purple Top)SARS-COV-2 Vaccination 07/07/2019, 07/28/2019, 02/21/2020, 01/25/2021   PNEUMOCOCCAL CONJUGATE-20 07/15/2021   Pneumococcal Polysaccharide-23 05/20/2012, 06/19/2020   Tdap 06/12/2011   Zoster, Live 12/16/2015    Past Medical History:  Diagnosis Date   Abnormal vaginal bleeding    Anxiety    Arthritis    Asthma    Barrett's esophagus    with high  grade dysplasia per endoscopy 05/2005 // followed by Dr. Lupita Commander ([facility])   Blood transfusion without reported diagnosis    Cataract 2019   Not sure, due to inhaler use   Chronic back pain    Complication of anesthesia    per patient she went in respiratory failure in recovery room in 2017   COPD (chronic obstructive pulmonary disease) (HCC)    Diabetes mellitus without complication (HCC)    type 2   Duodenal ulcer disease    thought to be contributed at least partly by overuse of NSAIDs and salicylates (goody powdr)   Emphysema of lung (HCC)    Endometrial polyp 03/2003   s/p resection in 03/2003. Path showing submucosal leiomyoma and benign  proliferative type endometrium   Family history of premature CAD 05/19/2018   Fibroid uterus    s/p myomectomy x 2   GERD (gastroesophageal reflux disease) 1990   Hiatal hernia    History of CVA (cerebrovascular accident) 10/2000   History of pineal cyst    11 mm cystic mass noted in  the pineal gland per MRI in 2001 - most consitent with simple pineal cyst   HSV infection    Hyperlipidemia    Hypertension    Hypothyroidism    with hx of multinodular goiter (noted on neck US  in 11/2002 - showing diffuse nodularity and inhomogenous texture diffusely BL)   Internal hemorrhoids    grade 2 per colonoscopy in 09/2006 - repeat colonoscopy rec in 5-10 years.   Migraine    Nevus    Personal history of failed conscious sedation 08/11/2011   Sleep apnea    does not use cpap   Stroke (HCC) 10/2000   Tachycardia    Thyroid  disease    Tobacco abuse 04/2012   quit    Tobacco History: Social History   Tobacco Use  Smoking Status Former   Current packs/day: 0.00   Average packs/day: 3.0 packs/day for 50.0 years (150.0 ttl pk-yrs)   Types: Cigarettes   Start date: 09/22/1965   Quit date: 09/23/2015   Years since quitting: 8.4  Smokeless Tobacco Never  Tobacco Comments   Counseling sheet 07-2011    Counseling given: Not Answered Tobacco  comments: Counseling sheet 07-2011    Outpatient Medications Prior to Visit  Medication Sig Dispense Refill   albuterol  (PROAIR  HFA) 108 (90 Base) MCG/ACT inhaler INHALE 2 PUFFS EVERY 6  HOURS AS NEEDED FOR  SHORTNESS OF BREATH OR  WHEEZING 34 g 3   bisoprolol  (ZEBETA ) 10 MG tablet Take 1 & 1/2  tablets (15 mg total) by mouth daily. 135 tablet 2   BREZTRI  AEROSPHERE 160-9-4.8 MCG/ACT AERO inhaler USE 2 INHALATIONS BY MOUTH INTO  THE LUNGS IN THE MORNING AND AT  BEDTIME 32.1 g 3   calcium  carbonate (TUMS - DOSED IN MG ELEMENTAL CALCIUM ) 500 MG chewable tablet Chew 1 tablet by mouth daily as needed for indigestion or heartburn.     clopidogrel  (PLAVIX ) 75 MG tablet Take 1 tablet (75 mg total) by mouth daily with breakfast. 90 tablet 2   Collagen-Vitamin C-Biotin (COLLAGEN PO) Take by mouth daily.     dapagliflozin propanediol (FARXIGA) 5 MG TABS tablet Take 1 tablet (5 mg total) by mouth daily. 90 tablet 0   diphenhydrAMINE  (BENADRYL ) 25 MG tablet Take 50 mg by mouth at bedtime as needed for sleep.     docusate sodium  (COLACE) 100 MG capsule Take 300 mg by mouth daily.     estradiol  (ESTRACE  VAGINAL) 0.1 MG/GM vaginal cream Place 1 Applicatorful vaginally at bedtime. 42.5 g 12   ezetimibe  (ZETIA ) 10 MG tablet Take 10 mg by mouth daily.     fluticasone  (FLONASE ) 50 MCG/ACT nasal spray USE 2 SPRAYS IN BOTH NOSTRILS  DAILY 16 g 5   furosemide  (LASIX ) 40 MG tablet TAKE 1 TABLET BY MOUTH DAILY AS  NEEDED FOR WEIGHT GAIN OF 3 LB  OVER AT NIGHT OR 5 LB IN 1 WEEK 100 tablet 2   glucose blood (ONETOUCH VERIO) test strip Use to check blood sugar twice daily. Dx: E11.9 300 each 1   ipratropium-albuterol  (DUONEB) 0.5-2.5 (3) MG/3ML SOLN Take 3 mLs by nebulization every 6 (six) hours as needed. 360 mL 6   Lancets (ONETOUCH DELICA PLUS LANCET33G) MISC USE TO CHECK BLOOD SUGAR  TWICE DAILY 200 each 3   levothyroxine  (SYNTHROID ) 175 MCG tablet Take 1 tablet (175 mcg total) by mouth daily. 90 tablet 0   Multiple  Vitamins-Minerals (HAIR SKIN & NAILS PO) Take by mouth daily.     nitroGLYCERIN  (NITROSTAT ) 0.4 MG  SL tablet Place 0.4 mg under the tongue every 5 (five) minutes as needed for chest pain.     OZEMPIC, 0.25 OR 0.5 MG/DOSE, 2 MG/3ML SOPN Inject 0.5 mg into the skin once a week.     pantoprazole  (PROTONIX ) 40 MG tablet Take 1 tablet (40 mg total) by mouth 2 (two) times daily before a meal. 180 tablet 2   potassium chloride  (KLOR-CON  M) 10 MEQ tablet Take 2 tablets (20 mEq total) by mouth daily for 3 days, THEN 1 tablet (10 mEq total) daily. 90 tablet 3   pregabalin  (LYRICA ) 50 MG capsule Take 50 mg by mouth daily.     REPATHA  SURECLICK 140 MG/ML SOAJ INJECT 1 PEN SUBCUTANEOUSLY  EVERY 2 WEEKS 6 mL 3   Respiratory Therapy Supplies (NEBULIZER/TUBING/MOUTHPIECE) KIT 1 application by Does not apply route every 6 (six) hours as needed. FAX TO [phone] 1 kit 11   rOPINIRole  (REQUIP ) 0.5 MG tablet Take 1 tablet (0.5 mg total) by mouth at bedtime. 30 tablet 0   traMADol  (ULTRAM ) 50 MG tablet Take 50 mg by mouth every 6 (six) hours as needed for moderate pain (pain score 4-6) or severe pain (pain score 7-10).     traMADol  (ULTRAM ) 50 MG tablet Take 1 tablet (50 mg total) by mouth every 6 (six) hours as needed for chronic back pain 90 tablet 2   valACYclovir  (VALTREX ) 500 MG tablet TAKE 1 TABLET BY MOUTH  DAILY 90 tablet 3   Vibegron  75 MG TABS Take 1 tablet (75 mg total) by mouth daily. 90 tablet 3   sucralfate  (CARAFATE ) 1 g tablet Take 1 tablet (1 g total) by mouth 4 (four) times daily. 120 tablet 0   No facility-administered medications prior to visit.     Review of Systems: as above    Physical Exam:  BP 118/74   Pulse 89   Temp 97.9 F (36.6 C) (Oral)   Ht 5' 3 (1.6 m)   Wt 156 lb 12.8 oz (71.1 kg)   LMP 02/27/2014   SpO2 98% Comment: room air  BMI 27.78 kg/m   GEN: Pleasant, interactive, well-kempt; obese; in no acute distress. HEENT:  Normocephalic and atraumatic. PERRLA. Sclera  white. Nasal turbinates pink, moist and patent bilaterally. No rhinorrhea present. Oropharynx pink and moist, without exudate or edema. No lesions, ulcerations NECK:  Supple w/ fair ROM. No JVD present. No lymphadenopathy.   CV: RRR, no m/r/g, no peripheral edema. Pulses intact, +2 bilaterally. No cyanosis, pallor or clubbing. PULMONARY:  Unlabored, regular breathing. Clear bilaterally A&P w/o wheezes/rales/rhonchi. No accessory muscle use.  GI: BS present and normoactive. Soft, non-tender to palpation.  MSK: No erythema, warmth or tenderness.  Neuro: A/Ox3. No focal deficits noted.   Skin: Warm, no lesions or rashe Psych: Normal affect and behavior. Judgement and thought content appropriate.     Lab Results:  CBC    Component Value Date/Time   WBC 5.1 02/08/2024 1400   RBC 4.61 02/08/2024 1400   HGB 13.6 02/08/2024 1400   HGB 12.6 01/21/2024 1115   HCT 43.1 02/08/2024 1400   HCT 40.6 01/21/2024 1115   PLT 230.0 02/08/2024 1400   PLT 264 01/21/2024 1115   MCV 93.5 02/08/2024 1400   MCV 94 01/21/2024 1115   MCH 29.2 01/21/2024 1115   MCH 30.3 01/03/2024 1321   MCHC 31.7 02/08/2024 1400   RDW 16.6 (H) 02/08/2024 1400   RDW 13.7 01/21/2024 1115   LYMPHSABS 1.1 02/08/2024 1400   LYMPHSABS  1.7 10/08/2015 1407   MONOABS 0.5 02/08/2024 1400   EOSABS 0.1 02/08/2024 1400   EOSABS 0.2 10/08/2015 1407   BASOSABS 0.0 02/08/2024 1400   BASOSABS 0.0 10/08/2015 1407    BMET    Component Value Date/Time   NA 138 02/08/2024 1400   NA 141 01/21/2024 1115   K 4.3 02/08/2024 1400   CL 103 02/08/2024 1400   CO2 28 02/08/2024 1400   GLUCOSE 109 (H) 02/08/2024 1400   BUN 14 02/08/2024 1400   BUN 8 01/21/2024 1115   CREATININE 0.78 02/08/2024 1400   CREATININE 0.76 09/28/2019 1211   CALCIUM  9.5 02/08/2024 1400   GFRNONAA >60 01/03/2024 0202   GFRNONAA 83 09/28/2019 1211   GFRAA 96 09/28/2019 1211    BNP    Component Value Date/Time   BNP 370.9 (H) 09/25/2015 1825      Imaging:  No results found.  Administration History     None          Latest Ref Rng & Units 02/18/2024    7:59 AM 03/30/2020   12:58 PM 04/26/2018    1:51 PM  PFT Results  FVC-Pre L 1.82  1.75  1.56   FVC-Predicted Pre % 61  56  49   FVC-Post L 1.91  2.00  1.88   FVC-Predicted Post % 64  65  60   Pre FEV1/FVC % % 49  54  45   Post FEV1/FCV % % 52  47  46   FEV1-Pre L 0.90  0.95  0.70   FEV1-Predicted Pre % 40  40  29   FEV1-Post L 0.99  0.93  0.87   DLCO uncorrected ml/min/mmHg 17.46  17.71  16.94   DLCO UNC% % 92  92  73   DLCO corrected ml/min/mmHg 17.36  18.43    DLCO COR %Predicted % 91  95    DLVA Predicted % 131  129  115   TLC L 6.01  5.09  5.62   TLC % Predicted % 122  103  114   RV % Predicted % 185  149  185     No results found for: NITRICOXIDE      Assessment & Plan:   COPD, severe (HCC) Severe COPD. Prior worsening dyspnea found to be related to significant anemia and heart strain. Clinically improved. Stable on current regimen. Action plan in place.   Patient Instructions  Continue Breztri  2 puffs Twice daily. Brush tongue and rinse mouth afterwards Continue Albuterol  inhaler 2 puffs every 6 hours as needed for shortness of breath or wheezing. Notify if symptoms persist despite rescue inhaler/neb use.  Continue flonase  nasal spray 2 sprays each nostril daily  Start CPAP 5-15 cmH2O, nasal mask of choice, every night, minimum of 4-6 hours a night.  Change equipment as directed. Wash your tubing with warm soap and water  daily, hang to dry. Wash humidifier portion weekly. Use bottled, distilled water  and change daily Be aware of reduced alertness and do not drive or operate heavy machinery if experiencing this or drowsiness.  Exercise encouraged, as tolerated. Healthy weight management discussed.  Avoid or decrease alcohol consumption and medications that make you more sleepy, if possible. Notify if persistent daytime sleepiness occurs even  with consistent use of PAP therapy.  Change CPAP supplies... Every month Mask cushions and/or nasal pillows CPAP machine filters Every 3 months Mask frame (not including the headgear) CPAP tubing Every 6 months Mask headgear Chin strap (if applicable) Humidifier water  tub  We discussed how untreated sleep apnea puts an individual at risk for cardiac arrhthymias, pulm HTN, DM, stroke and increases their risk for daytime accidents. We also briefly reviewed treatment options including weight loss, side sleeping position, oral appliance, CPAP therapy or referral to ENT for possible surgical options. Given the severity of your sleep apnea, the recommendation is for you to start CPAP therapy  Follow up in 10-12 weeks with Dr. Neda or Dr. Theodoro (new 30 min slot) to establish care and f/u on new CPAP and COPD. If symptoms do not improve or worsen, please contact office for sooner follow up or seek emergency care.    Severe obstructive sleep apnea Severe OSA, not currently on CPAP. Reviewed risks of untreated severe OSA. Shared decision to resume CPAP therapy given severity. Orders placed for new CPAP auto 5-15 cmH2O, nasal mask of choice. Reviewed proper care/use of device. Risks/benefits reviewed. Healthy weight management encouraged. Safe driving practices reviewed.   Severe anemia Related to GI bleeds. Currently resolved. Continue to monitor and f/u with GI/PCP as scheduled.  Former heavy tobacco smoker Followed by lung cancer screening program. LDCT chest June 2025 with Lung RADS 1. Repeat June 2026.     I spent 35 minutes of dedicated to the care of this patient on the date of this encounter to include pre-visit review of records, face-to-face time with the patient discussing conditions above, post visit ordering of testing, clinical documentation with the electronic health record, making appropriate referrals as documented, and communicating necessary findings to members of the  patients care team.  Comer LULLA Rouleau, NP 02/26/2024  Pt aware and understands NP's role.

## 2024-02-26 NOTE — Assessment & Plan Note (Signed)
 Followed by lung cancer screening program. LDCT chest June 2025 with Lung RADS 1. Repeat June 2026.

## 2024-02-26 NOTE — Assessment & Plan Note (Addendum)
 Related to GI bleeds. Currently resolved. Continue to monitor and f/u with GI/PCP as scheduled.

## 2024-02-26 NOTE — Assessment & Plan Note (Signed)
 Severe COPD. Prior worsening dyspnea found to be related to significant anemia and heart strain. Clinically improved. Stable on current regimen. Action plan in place.   Patient Instructions  Continue Breztri  2 puffs Twice daily. Brush tongue and rinse mouth afterwards Continue Albuterol  inhaler 2 puffs every 6 hours as needed for shortness of breath or wheezing. Notify if symptoms persist despite rescue inhaler/neb use.  Continue flonase  nasal spray 2 sprays each nostril daily  Start CPAP 5-15 cmH2O, nasal mask of choice, every night, minimum of 4-6 hours a night.  Change equipment as directed. Wash your tubing with warm soap and water  daily, hang to dry. Wash humidifier portion weekly. Use bottled, distilled water  and change daily Be aware of reduced alertness and do not drive or operate heavy machinery if experiencing this or drowsiness.  Exercise encouraged, as tolerated. Healthy weight management discussed.  Avoid or decrease alcohol consumption and medications that make you more sleepy, if possible. Notify if persistent daytime sleepiness occurs even with consistent use of PAP therapy.  Change CPAP supplies... Every month Mask cushions and/or nasal pillows CPAP machine filters Every 3 months Mask frame (not including the headgear) CPAP tubing Every 6 months Mask headgear Chin strap (if applicable) Humidifier water  tub  We discussed how untreated sleep apnea puts an individual at risk for cardiac arrhthymias, pulm HTN, DM, stroke and increases their risk for daytime accidents. We also briefly reviewed treatment options including weight loss, side sleeping position, oral appliance, CPAP therapy or referral to ENT for possible surgical options. Given the severity of your sleep apnea, the recommendation is for you to start CPAP therapy  Follow up in 10-12 weeks with Dr. Neda or Dr. Theodoro (new 30 min slot) to establish care and f/u on new CPAP and COPD. If symptoms do not improve or  worsen, please contact office for sooner follow up or seek emergency care.

## 2024-02-26 NOTE — Assessment & Plan Note (Addendum)
 Severe OSA, not currently on CPAP. Reviewed risks of untreated severe OSA. Shared decision to resume CPAP therapy given severity. Orders placed for new CPAP auto 5-15 cmH2O, nasal mask of choice. Reviewed proper care/use of device. Risks/benefits reviewed. Healthy weight management encouraged. Safe driving practices reviewed.

## 2024-02-29 ENCOUNTER — Encounter (HOSPITAL_COMMUNITY): Admission: RE | Admit: 2024-02-29 | Source: Ambulatory Visit

## 2024-03-02 ENCOUNTER — Telehealth (HOSPITAL_COMMUNITY): Payer: Self-pay

## 2024-03-02 ENCOUNTER — Encounter (HOSPITAL_COMMUNITY): Admission: RE | Admit: 2024-03-02 | Source: Ambulatory Visit

## 2024-03-02 NOTE — Telephone Encounter (Signed)
 Attempted to call patient regarding 4x no call, no shows in a row for 11:45 CR class- no answer, left message requesting for patient to call us  back.

## 2024-03-03 ENCOUNTER — Telehealth (HOSPITAL_COMMUNITY): Payer: Self-pay

## 2024-03-03 NOTE — Telephone Encounter (Signed)
 Patient returned call regarding absences from cardiac rehab, states she has been having 'chest discomfort' and has been afraid to exercise. She will return to class tomorrow and see how she does with exercising.

## 2024-03-04 ENCOUNTER — Telehealth (HOSPITAL_COMMUNITY): Payer: Self-pay

## 2024-03-04 ENCOUNTER — Encounter (HOSPITAL_COMMUNITY)
Admission: RE | Admit: 2024-03-04 | Discharge: 2024-03-04 | Disposition: A | Discharge status: Home / Self Care | Source: Ambulatory Visit | Attending: Cardiology | Admitting: Cardiology

## 2024-03-04 DIAGNOSIS — Z955 Presence of coronary angioplasty implant and graft: Secondary | ICD-10-CM

## 2024-03-04 NOTE — Telephone Encounter (Signed)
 Returning patient call after she called and voiced still having chest discomfort. Requested pt call us  back to discuss what is going on.   Alm Parkins MS, ACSM-CEP, CCRP

## 2024-03-04 NOTE — Progress Notes (Addendum)
 Incomplete Session Note  Patient Details  Name: Grace [name] MRN: [id number] Date of Birth: 1955/04/27 Referring Provider:   Flowsheet Row INTENSIVE CARDIAC REHAB ORIENT from 02/02/2024 in [facility]  Referring Provider Anner Alm ORN, MD    Grace [name] did not complete her rehab session.  Endorses heart beating fast and hard this past Tuesday night, sharp sporadic pains centrally located in her chest near her sternum that feel like a lightning bolt and then it's done.  Endorses continual pressure and discomfort that occasionally goes away with physical activity.  Denies taking NTG because the lighting bolt pain immediately goes away as fast as it comes on.  Pt has not been into Cardiac Rehab since sent home on 02/15/24.  Educated pt that if she should take NTG the next time she has this pain to see if it relieves the continual pressure and discomfort and if it does not to call 911 and present to the ED.  Educated pt on proper use of NTG.  Pt has had a [employer] Long Term Monitor that ended on 01/01/24 that shows greater than 1800 episodes of SVT and 1 run of VT that lasted 7 beats with a max rate 179bpm.  It doesn't seem like the monitor results have been reviewed by a provider yet.  Pt is currently on 15mg  of bisoprolol  daily.  Escalating this to her cardiology team.

## 2024-03-07 ENCOUNTER — Encounter (HOSPITAL_COMMUNITY)

## 2024-03-09 ENCOUNTER — Encounter (HOSPITAL_COMMUNITY)

## 2024-03-11 ENCOUNTER — Encounter (HOSPITAL_COMMUNITY)

## 2024-03-14 ENCOUNTER — Encounter (HOSPITAL_COMMUNITY)
Admission: RE | Admit: 2024-03-14 | Discharge: 2024-03-14 | Disposition: A | Discharge status: Home / Self Care | Source: Ambulatory Visit | Attending: Cardiology | Admitting: Cardiology

## 2024-03-14 ENCOUNTER — Encounter (HOSPITAL_COMMUNITY): Payer: Self-pay

## 2024-03-14 ENCOUNTER — Telehealth (HOSPITAL_COMMUNITY): Payer: Self-pay

## 2024-03-14 DIAGNOSIS — Z955 Presence of coronary angioplasty implant and graft: Secondary | ICD-10-CM

## 2024-03-14 NOTE — Telephone Encounter (Signed)
 Per cardiac rehab nurse JD, pt is placed on med hold from the program until after 12/9 f/u appt. Appts canceled up until then.

## 2024-03-14 NOTE — Progress Notes (Signed)
 Cardiac Individual Treatment Plan  Patient Details  Name: Grace [name] MRN: [id number] Date of Birth: [birth year] Referring Provider:   Flowsheet Row INTENSIVE CARDIAC REHAB ORIENT from 02/02/2024 in [facility]  Referring Provider Anner Alm ORN, MD    Initial Encounter Date:  Flowsheet Row INTENSIVE CARDIAC REHAB ORIENT from 02/02/2024 in [facility], [facility], [facility]  Date 02/02/24    Visit Diagnosis: 12/23/23 Status post coronary artery stent placement DES RCA  S/P coronary artery stent placement  Patient's Home Medications on Admission:  Current Outpatient Medications:    albuterol  (PROAIR  HFA) 108 (90 Base) MCG/ACT inhaler, INHALE 2 PUFFS EVERY 6  HOURS AS NEEDED FOR  SHORTNESS OF BREATH OR  WHEEZING, Disp: 34 g, Rfl: 3   bisoprolol  (ZEBETA ) 10 MG tablet, Take 1 & 1/2  tablets (15 mg total) by mouth daily., Disp: 135 tablet, Rfl: 2   BREZTRI  AEROSPHERE 160-9-4.8 MCG/ACT AERO inhaler, USE 2 INHALATIONS BY MOUTH INTO  THE LUNGS IN THE MORNING AND AT  BEDTIME, Disp: 32.1 g, Rfl: 3   calcium  carbonate (TUMS - DOSED IN MG ELEMENTAL CALCIUM ) 500 MG chewable tablet, Chew 1 tablet by mouth daily as needed for indigestion or heartburn., Disp: , Rfl:    clopidogrel  (PLAVIX ) 75 MG tablet, Take 1 tablet (75 mg total) by mouth daily with breakfast., Disp: 90 tablet, Rfl: 2   Collagen-Vitamin C-Biotin (COLLAGEN PO), Take by mouth daily., Disp: , Rfl:    dapagliflozin  propanediol (FARXIGA ) 5 MG TABS tablet, Take 1 tablet (5 mg total) by mouth daily., Disp: 90 tablet, Rfl: 0   diphenhydrAMINE  (BENADRYL ) 25 MG tablet, Take 50 mg by mouth at bedtime as needed for sleep., Disp: , Rfl:    docusate sodium  (COLACE) 100 MG capsule, Take 300 mg by mouth daily., Disp: , Rfl:    estradiol  (ESTRACE  VAGINAL) 0.1 MG/GM vaginal cream, Place 1 Applicatorful vaginally at bedtime., Disp: 42.5 g, Rfl: 12    ezetimibe  (ZETIA ) 10 MG tablet, Take 10 mg by mouth daily., Disp: , Rfl:    fluticasone  (FLONASE ) 50 MCG/ACT nasal spray, USE 2 SPRAYS IN BOTH NOSTRILS  DAILY, Disp: 16 g, Rfl: 5   furosemide  (LASIX ) 40 MG tablet, TAKE 1 TABLET BY MOUTH DAILY AS  NEEDED FOR WEIGHT GAIN OF 3 LB  OVER AT NIGHT OR 5 LB IN 1 WEEK, Disp: 100 tablet, Rfl: 2   glucose blood (ONETOUCH VERIO) test strip, Use to check blood sugar twice daily. Dx: E11.9, Disp: 300 each, Rfl: 1   ipratropium-albuterol  (DUONEB) 0.5-2.5 (3) MG/3ML SOLN, Take 3 mLs by nebulization every 6 (six) hours as needed., Disp: 360 mL, Rfl: 6   Lancets (ONETOUCH DELICA PLUS LANCET33G) MISC, USE TO CHECK BLOOD SUGAR  TWICE DAILY, Disp: 200 each, Rfl: 3   levothyroxine  (SYNTHROID ) 175 MCG tablet, Take 1 tablet (175 mcg total) by mouth daily., Disp: 90 tablet, Rfl: 0   Multiple Vitamins-Minerals (HAIR SKIN & NAILS PO), Take by mouth daily., Disp: , Rfl:    nitroGLYCERIN  (NITROSTAT ) 0.4 MG SL tablet, Place 0.4 mg under the tongue every 5 (five) minutes as needed for chest pain., Disp: , Rfl:    OZEMPIC, 0.25 OR 0.5 MG/DOSE, 2 MG/3ML SOPN, Inject 0.5 mg into the skin once a week., Disp: , Rfl:    pantoprazole  (PROTONIX ) 40 MG tablet, Take 1 tablet (40 mg total) by mouth 2 (two) times daily before a meal., Disp:  180 tablet, Rfl: 2   potassium chloride  (KLOR-CON  M) 10 MEQ tablet, Take 2 tablets (20 mEq total) by mouth daily for 3 days, THEN 1 tablet (10 mEq total) daily., Disp: 90 tablet, Rfl: 3   pregabalin  (LYRICA ) 50 MG capsule, Take 50 mg by mouth daily., Disp: , Rfl:    REPATHA  SURECLICK 140 MG/ML SOAJ, INJECT 1 PEN SUBCUTANEOUSLY  EVERY 2 WEEKS, Disp: 6 mL, Rfl: 3   Respiratory Therapy Supplies (NEBULIZER/TUBING/MOUTHPIECE) KIT, 1 application by Does not apply route every 6 (six) hours as needed. FAX TO [phone], Disp: 1 kit, Rfl: 11   rOPINIRole  (REQUIP ) 0.5 MG tablet, Take 1 tablet (0.5 mg total) by mouth at bedtime., Disp: 30 tablet, Rfl: 0   traMADol   (ULTRAM ) 50 MG tablet, Take 50 mg by mouth every 6 (six) hours as needed for moderate pain (pain score 4-6) or severe pain (pain score 7-10)., Disp: , Rfl:    traMADol  (ULTRAM ) 50 MG tablet, Take 1 tablet (50 mg total) by mouth every 6 (six) hours as needed for chronic back pain, Disp: 90 tablet, Rfl: 2   valACYclovir  (VALTREX ) 500 MG tablet, TAKE 1 TABLET BY MOUTH  DAILY, Disp: 90 tablet, Rfl: 3   Vibegron  75 MG TABS, Take 1 tablet (75 mg total) by mouth daily., Disp: 90 tablet, Rfl: 3  Past Medical History: Past Medical History:  Diagnosis Date   Abnormal vaginal bleeding    Anxiety    Arthritis    Asthma    Barrett's esophagus    with high grade dysplasia per endoscopy 05/2005 // followed by Dr. Lupita Commander ([facility])   Blood transfusion without reported diagnosis    Cataract 2019   Not sure, due to inhaler use   Chronic back pain    Complication of anesthesia    per patient she went in respiratory failure in recovery room in 2017   COPD (chronic obstructive pulmonary disease) (HCC)    Diabetes mellitus without complication (HCC)    type 2   Duodenal ulcer disease    thought to be contributed at least partly by overuse of NSAIDs and salicylates (goody powdr)   Emphysema of lung [facility])    Endometrial polyp 03/2003   s/p resection in 03/2003. 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   Migraine    Nevus    Personal history of failed conscious sedation 08/11/2011   Sleep apnea    does not use cpap   Stroke (HCC) 10/2000   Tachycardia    Thyroid  disease    Tobacco abuse 04/2012   quit    Tobacco Use: Social History   Tobacco Use  Smoking Status Former   Current packs/day: 0.00   Average packs/day: 3.0 packs/day  for 50.0 years (150.0 ttl pk-yrs)   Types: Cigarettes   Start date: 09/22/1965   Quit date: 09/23/2015   Years since quitting: 8.4  Smokeless Tobacco Never  Tobacco Comments   Counseling sheet 07-2011     Labs: Review Flowsheet  More data exists      Latest Ref Rng & Units 09/29/2023 12/08/2023 12/29/2023 12/30/2023 02/08/2024  Labs for ITP Cardiac and Pulmonary Rehab  Cholestrol 0 - 200 mg/dL - 64  - - 898   LDL (calc) 0 - 99 mg/dL - 12  - - 20   HDL-C >60.99 mg/dL - 36  - - 39.99   Trlycerides 0.0 - 149.0 mg/dL - 68  - - 896.9   Hemoglobin A1c 4.8 - 5.6 % 5.7     - - 5.7  -  TCO2 22 - 32 mmol/L - - 26  - -    Details       This result is from an external source.          Exercise Target Goals: Exercise Program Goal: Individual exercise prescription set using results from initial 6 min walk test and THRR while considering  patient's activity barriers and safety.   Exercise Prescription Goal: Initial exercise prescription builds to 30-45 minutes a day of aerobic activity, 2-3 days per week.  Home exercise guidelines will be given to patient during program as part of exercise prescription that the participant will acknowledge.   Education: Aerobic Exercise: - Group verbal and visual presentation on the components of exercise prescription. Introduces F.I.T.T principle from ACSM for exercise prescriptions.  Reviews F.I.T.T. principles of aerobic exercise including progression. Written material provided at class time.   Education: Resistance Exercise: - Group verbal and  visual presentation on the components of exercise prescription. Introduces F.I.T.T principle from ACSM for exercise prescriptions  Reviews F.I.T.T. principles of resistance exercise including progression. Written material provided at class time.    Education: Exercise & Equipment Safety: - Individual verbal instruction and demonstration of equipment use and safety with use of the equipment.   Education: Exercise Physiology & General Exercise Guidelines: - Group verbal and written instruction with models to review the exercise physiology of the cardiovascular system and associated critical values. Provides general exercise guidelines with specific guidelines to those with heart or lung disease. Written material provided at class time.   Education: Flexibility, Balance, Mind/Body Relaxation: - Group verbal and visual presentation with interactive activity on the components of exercise prescription. Introduces F.I.T.T principle from ACSM for exercise prescriptions. Reviews F.I.T.T. principles of flexibility and balance exercise training including progression. Also discusses the mind body connection.  Reviews various relaxation techniques to help reduce and manage stress (i.e. Deep breathing, progressive muscle relaxation, and visualization). Balance handout provided to take home. Written material provided at class time.   Activity Barriers & Risk Stratification:  Activity Barriers & Cardiac Risk Stratification - 02/02/24 1339       Activity Barriers & Cardiac Risk Stratification   Activity Barriers Other (comment);Shortness of Breath;Back Problems    Comments Kalsey has issues with a staggered gait. No falls.    Cardiac Risk Stratification Moderate          6 Minute Walk:  6 Minute Walk     Row Name 02/02/24 1524         6 Minute Walk   Phase Initial     Distance 1056 feet     Walk Time 6 minutes     # of Rest  Breaks 0     MPH 2     METS 1.7     RPE 11     Perceived Dyspnea   2     VO2 Peak 9.28     Symptoms Yes (comment)     Comments Mild shortness of breath     Resting HR 81 bpm     Resting BP 138/86     Resting Oxygen  Saturation  97 %     Exercise Oxygen  Saturation  during 6 min walk 90 %     Max Ex. HR 108 bpm     Max Ex. BP 154/84     2 Minute Post BP 146/88        Oxygen  Initial Assessment:   Oxygen  Re-Evaluation:   Oxygen  Discharge (Final Oxygen  Re-Evaluation):   Initial Exercise Prescription:  Initial Exercise Prescription - 02/02/24 1600       Date of Initial Exercise RX and Referring Provider   Date 02/02/24    Referring Provider Anner Alm ORN, MD    Expected Discharge Date 04/29/24      Recumbant Bike   Level 1    RPM 30    Watts 15    Minutes 15    METs 1.7      NuStep   Level 1    SPM 63    Minutes 15    METs 1.7      Prescription Details   Frequency (times per week) 3    Duration Progress to 30 minutes of continuous aerobic without signs/symptoms of physical distress      Intensity   THRR 40-80% of Max Heartrate 61-122    Ratings of Perceived Exertion 11-13    Perceived Dyspnea 0-4      Progression   Progression Continue progressive overload as per policy without signs/symptoms or physical distress.      Resistance Training   Training Prescription Yes    Weight 2 lbs    Reps 10-15          Perform Capillary Blood Glucose checks as needed.  Exercise Prescription Changes:   Exercise Prescription Changes     Row Name 02/10/24 1400             Response to Exercise   Blood Pressure (Admit) 114/72       Blood Pressure (Exercise) 118/76       Blood Pressure (Exit) 112/72       Heart Rate (Admit) 86 bpm       Heart Rate (Exercise) 124 bpm       Heart Rate (Exit) 94 bpm       Rating of Perceived Exertion (Exercise) 13       Symptoms None       Comments Pt's first day in the CRP2 program       Duration Continue with 30 min of aerobic exercise without signs/symptoms of physical distress.        Intensity THRR unchanged         Progression   Progression Continue to progress workloads to maintain intensity without signs/symptoms of physical distress.       Average METs 2.25         Resistance Training   Training Prescription No       Weight No wts on wednesdays         Interval Training   Interval Training No         Recumbant Bike   Level 1  RPM 71       Watts 15       Minutes 15       METs 2.3         NuStep   Level 1       SPM 84       Minutes 15       METs 2.2          Exercise Comments:   Exercise Comments     Row Name 02/10/24 1427 02/24/24 1500 03/07/24 1500       Exercise Comments Pt's first day in the CRP2 program. Pt tolerated session well with no complaints. Pt due for review of METs but did not attend. Will complete upon return. Pt due for METs and goals review but has been on medical hold. Will complete when allowed to return.        Exercise Goals and Review:   Exercise Goals     Row Name 02/02/24 1437             Exercise Goals   Increase Physical Activity Yes       Intervention Provide advice, education, support and counseling about physical activity/exercise needs.;Develop an individualized exercise prescription for aerobic and resistive training based on initial evaluation findings, risk stratification, comorbidities and participant's personal goals.       Expected Outcomes Short Term: Attend rehab on a regular basis to increase amount of physical activity.;Long Term: Exercising regularly at least 3-5 days a week.;Long Term: Add in home exercise to make exercise part of routine and to increase amount of physical activity.       Increase Strength and Stamina Yes       Intervention Provide advice, education, support and counseling about physical activity/exercise needs.;Develop an individualized exercise prescription for aerobic and resistive training based on initial evaluation findings, risk stratification, comorbidities and  participant's personal goals.       Expected Outcomes Short Term: Increase workloads from initial exercise prescription for resistance, speed, and METs.;Short Term: Perform resistance training exercises routinely during rehab and add in resistance training at home;Long Term: Improve cardiorespiratory fitness, muscular endurance and strength as measured by increased METs and functional capacity ( )       Able to understand and use rate of perceived exertion (RPE) scale Yes       Intervention Provide education and explanation on how to use RPE scale       Expected Outcomes Short Term: Able to use RPE daily in rehab to express subjective intensity level;Long Term:  Able to use RPE to guide intensity level when exercising independently       Knowledge and understanding of Target Heart Rate Range (THRR) Yes       Intervention Provide education and explanation of THRR including how the numbers were predicted and where they are located for reference       Expected Outcomes Short Term: Able to state/look up THRR;Short Term: Able to use daily as guideline for intensity in rehab;Long Term: Able to use THRR to govern intensity when exercising independently       Able to check pulse independently Yes       Intervention Provide education and demonstration on how to check pulse in carotid and radial arteries.;Review the importance of being able to check your own pulse for safety during independent exercise       Expected Outcomes Short Term: Able to explain why pulse checking is important during independent exercise;Long Term: Able to check  pulse independently and accurately       Understanding of Exercise Prescription Yes       Intervention Provide education, explanation, and written materials on patient's individual exercise prescription       Expected Outcomes Short Term: Able to explain program exercise prescription;Long Term: Able to explain home exercise prescription to exercise independently           Exercise Goals Re-Evaluation :  Exercise Goals Re-Evaluation     Row Name 02/10/24 1425             Exercise Goal Re-Evaluation   Exercise Goals Review Increase Physical Activity;Understanding of Exercise Prescription;Increase Strength and Stamina;Knowledge and understanding of Target Heart Rate Range (THRR);Able to understand and use rate of perceived exertion (RPE) scale       Comments Pt's first day in the CRP2 program. Pt understands the exercise Rx, THRR, and RPE scale.       Expected Outcomes Will continue to monitor the patient and will progress the exercise workloads as tolerated.          Discharge Exercise Prescription (Final Exercise Prescription Changes):  Exercise Prescription Changes - 02/10/24 1400       Response to Exercise   Blood Pressure (Admit) 114/72    Blood Pressure (Exercise) 118/76    Blood Pressure (Exit) 112/72    Heart Rate (Admit) 86 bpm    Heart Rate (Exercise) 124 bpm    Heart Rate (Exit) 94 bpm    Rating of Perceived Exertion (Exercise) 13    Symptoms None    Comments Pt's first day in the CRP2 program    Duration Continue with 30 min of aerobic exercise without signs/symptoms of physical distress.    Intensity THRR unchanged      Progression   Progression Continue to progress workloads to maintain intensity without signs/symptoms of physical distress.    Average METs 2.25      Resistance Training   Training Prescription No    Weight No wts on wednesdays      Interval Training   Interval Training No      Recumbant Bike   Level 1    RPM 71    Watts 15    Minutes 15    METs 2.3      NuStep   Level 1    SPM 84    Minutes 15    METs 2.2          Nutrition:  Target Goals: Understanding of nutrition guidelines, daily intake of sodium 1500mg , cholesterol 200mg , calories 30% from fat and 7% or less from saturated fats, daily to have 5 or more servings of fruits and vegetables.  Education: Nutrition 1 -Group instruction  provided by verbal, written material, interactive activities, discussions, models, and posters to present general guidelines for heart healthy nutrition including macronutrients, label reading, and promoting whole foods over processed counterparts. Education serves as pensions consultant of discussion of heart healthy eating for all. Written material provided at class time.    Education: Nutrition 2 -Group instruction provided by verbal, written material, interactive activities, discussions, models, and posters to present general guidelines for heart healthy nutrition including sodium, cholesterol, and saturated fat. Providing guidance of habit forming to improve blood pressure, cholesterol, and body weight. Written material provided at class time.     Biometrics:  Pre Biometrics - 02/02/24 1313       Pre Biometrics   Waist Circumference 43.5 inches    Hip Circumference 46.5 inches  Waist to Hip Ratio 0.94 %    Triceps Skinfold 21.5 mm    % Body Fat 42 %    Grip Strength 17 kg    Flexibility --   not performed,  chronic back pain   Single Leg Stand 1.97 seconds           Nutrition Therapy Plan and Nutrition Goals:   Nutrition Assessments:  MEDIFICTS Score Key: >=70 Need to make dietary changes  40-70 Heart Healthy Diet <= 40 Therapeutic Level Cholesterol Diet  Flowsheet Row INTENSIVE CARDIAC REHAB from 02/10/2024 in [facility]  Picture Your Plate Total Score on Admission 47   Picture Your Plate Scores: <59 Unhealthy dietary pattern with much room for improvement. 41-50 Dietary pattern unlikely to meet recommendations for good health and room for improvement. 51-60 More healthful dietary pattern, with some room for improvement.  >60 Healthy dietary pattern, although there may be some specific behaviors that could be improved.    Nutrition Goals Re-Evaluation:   Nutrition Goals Discharge (Final Nutrition Goals  Re-Evaluation):   Psychosocial: Target Goals: Acknowledge presence or absence of significant depression and/or stress, maximize coping skills, provide positive support system. Participant is able to verbalize types and ability to use techniques and skills needed for reducing stress and depression.   Education: Stress, Anxiety, and Depression - Group verbal and visual presentation to define topics covered.  Reviews how body is impacted by stress, anxiety, and depression.  Also discusses healthy ways to reduce stress and to treat/manage anxiety and depression. Written material provided at class time.   Education: Sleep Hygiene -Provides group verbal and written instruction about how sleep can affect your health.  Define sleep hygiene, discuss sleep cycles and impact of sleep habits. Review good sleep hygiene tips.   Initial Review & Psychosocial Screening:  Initial Psych Review & Screening - 02/02/24 1415       Initial Review   Current issues with Current Sleep Concerns      Family Dynamics   Good Support System? Yes    Comments Cybill states she doesnn't have any family, but she has good support from friends. She's content with life. She states she has low motivation because of inablity to do housework(vacuuming) due to back pain and shortness of breath.      Barriers   Psychosocial barriers to participate in program There are no identifiable barriers or psychosocial needs.      Screening Interventions   Interventions Encouraged to exercise;Provide feedback about the scores to participant    Expected Outcomes Short Term goal: Identification and review with participant of any Quality of Life or Depression concerns found by scoring the questionnaire.;Long Term goal: The participant improves quality of Life and PHQ9 Scores as seen by post scores and/or verbalization of changes          Quality of Life Scores:   Quality of Life - 02/02/24 1613       Quality of Life   Select  Quality of Life      Quality of Life Scores   Health/Function Pre 11.88 %    Socioeconomic Pre 24 %    Psych/Spiritual Pre 20.75 %    Family Pre 30 %    GLOBAL Pre 17.16 %         Scores of 19 and below usually indicate a poorer quality of life in these areas.  A difference of  2-3 points is a clinically meaningful  difference.  A difference of 2-3 points in the total score of the Quality of Life Index has been associated with significant improvement in overall quality of life, self-image, physical symptoms, and general health in studies assessing change in quality of life.  PHQ-9: Review Flowsheet  More data exists      02/08/2024 02/02/2024 04/17/2022 11/18/2021 04/03/2021  Depression screen PHQ 2/9  Decreased Interest 0 0 0 2 0  Down, Depressed, Hopeless 0 0 1 2 0  PHQ - 2 Score 0 0 1 4 0  Altered sleeping 2 2 - 2 -  Tired, decreased energy 2 2 - 2 -  Change in appetite 0 0 - 2 -  Feeling bad or failure about yourself  0 0 - 0 -  Trouble concentrating 0 1 - 0 -  Moving slowly or fidgety/restless 0 0 - 0 -  Suicidal thoughts 0 0 - 0 -  PHQ-9 Score 4  5  - 10  -  Difficult doing work/chores Very difficult Not difficult at all - Not difficult at all -    Details       Data saved with a previous flowsheet row definition        Interpretation of Total Score  Total Score Depression Severity:  1-4 = Minimal depression, 5-9 = Mild depression, 10-14 = Moderate depression, 15-19 = Moderately severe depression, 20-27 = Severe depression   Psychosocial Evaluation and Intervention:   Psychosocial Re-Evaluation:  Psychosocial Re-Evaluation     Row Name 02/16/24 1020 03/09/24 1702           Psychosocial Re-Evaluation   Current issues with Current Sleep Concerns Current Sleep Concerns      Comments Pt has not voiced any additional psychosocial concerns during exercise at cardiac rehab. Pt has not voiced any additional psychosocial concerns during exercise at cardiac rehab.       Expected Outcomes Jamese will have controlled or decreased concerns/stressors upon completion of cardiac rehab. Shamarie will have controlled or decreased concerns/stressors upon completion of cardiac rehab.      Interventions Encouraged to attend Cardiac Rehabilitation for the exercise;Relaxation education Encouraged to attend Cardiac Rehabilitation for the exercise;Relaxation education      Continue Psychosocial Services  No Follow up required No Follow up required         Psychosocial Discharge (Final Psychosocial Re-Evaluation):  Psychosocial Re-Evaluation - 03/09/24 1702       Psychosocial Re-Evaluation   Current issues with Current Sleep Concerns    Comments Pt has not voiced any additional psychosocial concerns during exercise at cardiac rehab.    Expected Outcomes Maevyn will have controlled or decreased concerns/stressors upon completion of cardiac rehab.    Interventions Encouraged to attend Cardiac Rehabilitation for the exercise;Relaxation education    Continue Psychosocial Services  No Follow up required          Vocational Rehabilitation: Provide vocational rehab assistance to qualifying candidates.   Vocational Rehab Evaluation & Intervention:  Vocational Rehab - 02/02/24 1417       Initial Vocational Rehab Evaluation & Intervention   Assessment shows need for Vocational Rehabilitation No      Vocational Rehab Re-Evaulation   Comments Willie is on disability due to chronic back pain and shortness of breath. No VR needs at this time.          Education: Education Goals: Education classes will be provided on a variety of topics geared toward better understanding of heart health and risk factor modification. Participant  will state understanding/return demonstration of topics presented as noted by education test scores.  Learning Barriers/Preferences:  Learning Barriers/Preferences - 02/02/24 1417       Learning Barriers/Preferences   Learning Barriers None     Learning Preferences Computer/Internet;Group Instruction;Individual Instruction;Pictoral;Skilled Demonstration;Verbal Instruction;Video;Written Material;Audio          General Cardiac Education Topics:  AED/CPR: - Group verbal and written instruction with the use of models to demonstrate the basic use of the AED with the basic ABC's of resuscitation.   Test and Procedures: - Group verbal and visual presentation and models provide information about basic cardiac anatomy and function. Reviews the testing methods done to diagnose heart disease and the outcomes of the test results. Describes the treatment choices: Medical Management, Angioplasty, or Coronary Bypass Surgery for treating various heart conditions including Myocardial Infarction, Angina, Valve Disease, and Cardiac Arrhythmias. Written material provided at class time.   Medication Safety: - Group verbal and visual instruction to review commonly prescribed medications for heart and lung disease. Reviews the medication, class of the drug, and side effects. Includes the steps to properly store meds and maintain the prescription regimen. Written material provided at class time.   Intimacy: - Group verbal instruction through game format to discuss how heart and lung disease can affect sexual intimacy. Written material provided at class time.   Know Your Numbers and Heart Failure: - Group verbal and visual instruction to discuss disease risk factors for cardiac and pulmonary disease and treatment options.  Reviews associated critical values for Overweight/Obesity, Hypertension, Cholesterol, and Diabetes.  Discusses basics of heart failure: signs/symptoms and treatments.  Introduces Heart Failure Zone chart for action plan for heart failure. Written material provided at class time.   Infection Prevention: - Provides verbal and written material to individual with discussion of infection control including proper hand washing and proper  equipment cleaning during exercise session.   Falls Prevention: - Provides verbal and written material to individual with discussion of falls prevention and safety.   Other: -Provides group and verbal instruction on various topics (see comments)   Knowledge Questionnaire Score:  Knowledge Questionnaire Score - 02/02/24 1418       Knowledge Questionnaire Score   Pre Score 24/24          Core Components/Risk Factors/Patient Goals at Admission:  Personal Goals and Risk Factors at Admission - 02/02/24 1419       Core Components/Risk Factors/Patient Goals on Admission   Lipids Yes    Intervention Provide education and support for participant on nutrition & aerobic/resistive exercise along with prescribed medications to achieve LDL 70mg , HDL >40mg .    Expected Outcomes Short Term: Participant states understanding of desired cholesterol values and is compliant with medications prescribed. Participant is following exercise prescription and nutrition guidelines.;Long Term: Cholesterol controlled with medications as prescribed, with individualized exercise RX and with personalized nutrition plan. Value goals: LDL < 70mg , HDL > 40 mg.          Education:Diabetes - Individual verbal and written instruction to review signs/symptoms of diabetes, desired ranges of glucose level fasting, after meals and with exercise. Acknowledge that pre and post exercise glucose checks will be done for 3 sessions at entry of program.   Core Components/Risk Factors/Patient Goals Review:   Goals and Risk Factor Review     Row Name 02/16/24 1023 03/09/24 1702           Core Components/Risk Factors/Patient Goals Review   Personal Goals Review Lipids Lipids  Review Fatema is doing well with exercise at cardiac rehab. Vital signs and CBG's have been stable. Tona has only had one week of CR sessions since orientation MET levels were obtained and is maintaining these at this time. Linnae is doing  well with exercise at cardiac rehab. CBG's have been stable but she has had some tachycardia while at rest at Cardiac Rehab.  Ameriah sees her cardiologist on 03/22/24 to investigate her recurrent palpitations and possible arrhythmias.      Expected Outcomes Riya will continue to particpate in cardiac rehab for exercise nutriton and lifestyle modifications. Brock will continue to particpate in cardiac rehab for exercise nutriton and lifestyle modifications.         Core Components/Risk Factors/Patient Goals at Discharge (Final Review):   Goals and Risk Factor Review - 03/09/24 1702       Core Components/Risk Factors/Patient Goals Review   Personal Goals Review Lipids    Review Giovanni is doing well with exercise at cardiac rehab. CBG's have been stable but she has had some tachycardia while at rest at Cardiac Rehab.  Kathyleen sees her cardiologist on 03/22/24 to investigate her recurrent palpitations and possible arrhythmias.    Expected Outcomes Alecea will continue to particpate in cardiac rehab for exercise nutriton and lifestyle modifications.          ITP Comments:  ITP Comments     Row Name 02/02/24 1315 02/10/24 1407 03/09/24 1654       ITP Comments Wilbert Bihari, MD: Medical Director. Introduction to the [facility]/Intensive Cardiac Rehab. Initial orientaion packet reviewed with the patient. 30 Day ITP review.  Kenzey began cardiac rehab today (02/10/24) and tolerated exercise well. 30 Day ITP review.  Rahaf has had four sessions of cardiac rehab since her orientation (02/02/24) and has tolerated exercise well when in attendance.  She has had recurrent palpitations for which she is on a medical hold.  Tonantzin she sees her cardiologist on 03/22/24.        Comments: see ITP comments

## 2024-03-16 ENCOUNTER — Encounter (HOSPITAL_COMMUNITY)

## 2024-03-18 ENCOUNTER — Encounter (HOSPITAL_COMMUNITY)

## 2024-03-21 ENCOUNTER — Encounter (HOSPITAL_COMMUNITY)

## 2024-03-21 ENCOUNTER — Other Ambulatory Visit (HOSPITAL_COMMUNITY): Payer: Self-pay

## 2024-03-22 ENCOUNTER — Encounter: Payer: Self-pay | Admitting: Cardiology

## 2024-03-22 ENCOUNTER — Other Ambulatory Visit (HOSPITAL_COMMUNITY): Payer: Self-pay

## 2024-03-22 ENCOUNTER — Ambulatory Visit: Admitting: Nurse Practitioner

## 2024-03-22 ENCOUNTER — Ambulatory Visit: Attending: Cardiology | Admitting: Cardiology

## 2024-03-22 VITALS — BP 146/82 | HR 85 | Ht 63.0 in | Wt 158.0 lb

## 2024-03-22 DIAGNOSIS — I10 Essential (primary) hypertension: Secondary | ICD-10-CM | POA: Diagnosis not present

## 2024-03-22 DIAGNOSIS — E119 Type 2 diabetes mellitus without complications: Secondary | ICD-10-CM | POA: Diagnosis not present

## 2024-03-22 DIAGNOSIS — T466X5D Adverse effect of antihyperlipidemic and antiarteriosclerotic drugs, subsequent encounter: Secondary | ICD-10-CM | POA: Diagnosis not present

## 2024-03-22 DIAGNOSIS — Z955 Presence of coronary angioplasty implant and graft: Secondary | ICD-10-CM

## 2024-03-22 DIAGNOSIS — G4733 Obstructive sleep apnea (adult) (pediatric): Secondary | ICD-10-CM | POA: Diagnosis not present

## 2024-03-22 DIAGNOSIS — R0789 Other chest pain: Secondary | ICD-10-CM | POA: Diagnosis not present

## 2024-03-22 DIAGNOSIS — K22719 Barrett's esophagus with dysplasia, unspecified: Secondary | ICD-10-CM | POA: Diagnosis not present

## 2024-03-22 DIAGNOSIS — I25118 Atherosclerotic heart disease of native coronary artery with other forms of angina pectoris: Secondary | ICD-10-CM

## 2024-03-22 DIAGNOSIS — G720 Drug-induced myopathy: Secondary | ICD-10-CM

## 2024-03-22 DIAGNOSIS — J449 Chronic obstructive pulmonary disease, unspecified: Secondary | ICD-10-CM | POA: Diagnosis not present

## 2024-03-22 DIAGNOSIS — E785 Hyperlipidemia, unspecified: Secondary | ICD-10-CM | POA: Diagnosis not present

## 2024-03-22 DIAGNOSIS — E1169 Type 2 diabetes mellitus with other specified complication: Secondary | ICD-10-CM | POA: Diagnosis not present

## 2024-03-22 MED ORDER — DILTIAZEM HCL ER COATED BEADS 120 MG PO CP24
120.0000 mg | ORAL_CAPSULE | Freq: Every day | ORAL | 3 refills | Status: DC
  Filled 2024-03-22: qty 30, 30d supply, fill #0

## 2024-03-22 MED ORDER — BISOPROLOL FUMARATE 10 MG PO TABS: 10.0000 mg | ORAL_TABLET | Freq: Two times a day (BID) | ORAL | 3 refills | Status: AC

## 2024-03-22 MED ORDER — DILTIAZEM HCL ER COATED BEADS 120 MG PO CP24: 120.0000 mg | ORAL_CAPSULE | Freq: Every day | ORAL | 3 refills | Status: AC

## 2024-03-22 NOTE — Assessment & Plan Note (Signed)
 She is on Farxiga  5 mg daily, Ozempic with most recent A1c in September 5 0.7.

## 2024-03-22 NOTE — Assessment & Plan Note (Signed)
 Intolerant to multiple different statins.  She had been on atorvastatin  and then converted to rosuvastatin  with pretty well-controlled lipids but had significant myalgias as well as memory issues. As a result she has not been transitioned to Repatha  +10 mg Zetia . Most recent LDL in October was 20.  Excellent control.

## 2024-03-22 NOTE — Assessment & Plan Note (Addendum)
 Nocturnal chest pain and palpitations Intermittent nocturnal symptoms, 2-3 times weekly, lasting 5-10 minutes. Not exertion-related. Possible link to obstructive sleep apnea or benign arrhythmias. Multifactorial.  Could be multiple different things.  Based on the recurrence of symptoms, we need to exclude ischemia.  With known anatomy, we need to exclude inferior ischemia as-she has a huge wraparound RCA PDA and a relatively diminutive LAD.   She did not do well with nitroglycerin , so I held off on using Imdur , but will add diltiazem  120 mg nightly for antianginal benefit.  - Increased bisoprolol  to 10 mg twice daily. - Added low dose Diltiazem  120 mg at night. - Ordered Lexiscan Myoview 

## 2024-03-22 NOTE — Progress Notes (Signed)
 Cardiology Office Note:  .   Date:  03/22/2024  ID:  Grace [name], DOB Apr 14, 1956, MRN [id number] PCP: Nedra Tinnie LABOR, NP  [facility] Providers Cardiologist:  Alm Clay, MD     Chief Complaint  Patient presents with   Follow-up    64-month follow-up notes chest pain and dyspnea as well as palpitations/fast heart rate   Coronary Artery Disease    Initially did well after RCA PCI but now noting some chest discomfort of various types.    Patient Profile: .     Grace [name] is [name] formerly obese 68 y.o. female long-term smoker with [name] PMH notable for CAD (RCA PCI), HTN (statin intolerance-on Repatha ), HLD, DM-2, prior CVA, OSA and COPD, Barrett's esophagus, and history of uterine bleeding (s/p D&C) who presents here for 62-month follow-up to discuss chest pain at the request of McElwee, Lauren A, NP.  I last saw Grace [name] in November 2021 for follow-up evaluation of coronary calcification on CT scan with no Impella history of CAD and risk factors.  However her biggest issue was wanting preoperative relation for bronchoscopy.  With multiple ongoing issues, I had suggested doing [name] coronary CTA to exclude ischemic CAD at that time, but she declined citing that she had lots of other things to do.  She was not sure if they are doing [name] bronchoscopy.  Similarly she was supposed about [name] Coronary CTA done that was canceled due to scheduling issues around COVID.  She noted that she was not using her CPAP because of claustrophobia symptoms.  Vascepa and tried nasal prongs.       I last saw Grace [name] on September 18, 2023 for ongoing chest pain and exertional dyspnea palpitations and orthopnea.  We discussed her coronary CTA results and talked about cardiac catheterization, but felt that we should hold out till she had her D&C for uterine bleeding first.  She was then seen by Damien Braver, NP on August 26 to discuss scheduling cardiac catheterization she was in agreement she had lots of  chest pain sometimes there would bring tears to her eyes.  She also noted tachycardia spells..  Zio patch ordered-reviewed below.  She went to the ER on 12/29/2023 with abdominal pain and dyspnea as well as chest discomfort.  After acute blood loss anemia likely upper GI bleed from pyloric ulcer.  Given 4 units of blood EGD showed prepyloric ulcer she was started on PPI.  Aspirin  stopped and she was continued on Plavix .  Discharged home on 01/03/2024.  Her most recent cardiology visit was 01/21/2024 with Damien Braver, NP as [name] hospitalization follow-up.  She was stable (standpoint just occasional twinges in her chest but no active chest pain.  Stable dyspnea.  Borderline low blood pressure, but better at home.  They discussed the results of her monitor with frequent runs of PAT.  She was on bisoprolol  and PRN Lasix  with no major complaints.  I recommended that she hold aspirin  indefinitely.  She was seen by Comer Rouleau, NP from pulmonary medicine on number 14th for COPD and OSA evaluation.  She was found to have severe OSA and was started on CPAP 5-15 cm water .  Also noted to have severe COPD.  Subjective  Discussed the use of AI scribe software for clinical note transcription with the patient, who gave verbal consent to proceed.  History of Present Illness  She experiences chest pain primarily at night, described as [name] sharp, lightning bolt-like sensation in the  center of her chest. These episodes occur approximately twice [name] week, lasting for about five to ten minutes, and are severe enough to wake her from sleep. The pain does not typically occur during physical activity or cardiac rehab, and she feels better when exercising. She has not used nitroglycerin  due to concerns about headaches, and tramadol  is ineffective for pain relief.  She experiences palpitations, describing her heart as racing or fluttering, often accompanying the chest pain. These episodes occur [name] couple of times [name] week, mostly at  night when she is lying down. She underwent [name] cardiac catheterization on September 10th, during which [name] stent was placed.  She has [name] history of hypertension and was previously on losartan  but is now only taking bisoprolol  15 mg daily for blood pressure management. Her blood pressure has been running higher than usual, though not dangerously high. She also reports using Ozempic for weight loss.  She experiences occasional swelling in her legs and uses furosemide  as needed. She sleeps on two pillows to aid breathing and recently started using [name] CPAP machine after [name] sleep study indicated 32 episodes of apnea. No waking up short of breath but occasional shortness of breath when lying down. She confirms swelling in her legs and denies significant weight gain. She was previously anemic and hospitalized for it in November of the previous year, but she is no longer anemic.     Objective   Medications: Repatha  140 mg every 2 weeks; Zetia  10 mg daily Plavix  75 mg daily Bisoprolol  15 mg daily (1-1/2 tablets of 10 mg tabs);.  Nitroglycerin  Furosemide  40 mg PRN;  Farxiga  5 mg daily; Ozempic 0.5 mg weekly Albuterol  inhaler as needed breast tree Aerosphere 2 dilations twice daily, Flonase  2 sprays daily, DuoNeb every 6 hours as needed Synthroid  125 mcg daily Protonix  40 IV daily Background Synthroid  175 mg daily; pregabalin  50 mg daily; Requip  0.5 mg nightly  Studies Reviewed: SABRA   EKG Interpretation Date/Time:  Tuesday March 22 2024 11:43:37 EST Ventricular Rate:  80 PR Interval:  170 QRS Duration:  82 QT Interval:  368 QTC Calculation: 424 R Axis:   25  Text Interpretation: Normal sinus rhythm Normal ECG When compared with ECG of 22-Feb-2024 13:16, No significant change was found Confirmed by Anner Lenis ([id number]) on 03/22/2024 12:22:58 PM   Lab Results  Component Value Date   WBC 5.1 02/08/2024   HGB 13.6 02/08/2024   HCT 43.1 02/08/2024   MCV 93.5 02/08/2024   PLT 230.0 02/08/2024    Lab Results  Component Value Date   CHOL 101 02/08/2024   HDL 60.00 02/08/2024   LDLCALC 20 02/08/2024   LDLDIRECT 104 (H) 06/12/2010   TRIG 103.0 02/08/2024   CHOLHDL 2 02/08/2024   Lab Results  Component Value Date   NA 138 02/08/2024   K 4.3 02/08/2024   CREATININE 0.78 02/08/2024   GFR 77.86 02/08/2024   GLUCOSE 109 (H) 02/08/2024    Results DIAGNOSTIC Coronary CTA (08/29/2022): Emphysema, coronary calcification, aortic atherosclerosis and hepatic steatosis.  CAC score severely elevated at 2262.  TPG 1356 mm-extensive mixed calcified and noncalcified plaque.  Severe CAD of the mid RCA FFR abnormal and mild proximal LAD and LCx disease and possible severe disease mid LAD with [name] lot of FFR. Cardiac Catheterization (12/23/2023): Proximal RCA 50%, 80% mid RCA 80% with 40% into acute marginal branch.  (DES PCI score flex angioplasty-Onyx frontier 4.0 x 38 mm stent postdilated to 4.3 mm.).  The apical LAD is diminutive  and barely reaches the mid vessel.  Very small caliber.  Several branches arise prior to the terminal diminutive branch.  Large caliber nondominant LCx terminates as [name] large lateral OM branch.          Diagnostic: Dominance: Right                                                                         Intervention  Echocardiogram (12/30/2023): Normal LVEF 55 to 60%.  Mild concentric LVH with no RWMA.  Normal diastolic parameters.  Mildly elevated PAP.   Normal RAP.  Normal aortic and mitral valves.. 13-day Zio patch: Patch Wear Time: 12 days and 9 hours (2025-08-28T19:55:40-0400 to 2025-09-10T05:46:38-0400)    Predominantly sinus rhythm with rate range of 68 to 134 bpm and an average of 91 bpm.   Occasional (1.6%) which are atrial contractions (PACs with rare couplets and triplets.  Also rare (<1%) isolated PVCs.   1 short burst of ~nonsustained ventricular tachycardia with 7 beats at [name] average rate of 167 bpm   Frequent (1829) atrial burst.  Fastest interval was 5 beats  with [name] max rate of 170 bpm (2.5 seconds), longest was 56 beats (31.4 seconds) with an average rate of 107 bpm.   No Sustained Arrhythmias: Atrial Tachycardia (AT), Supraventricular Tachycardia (SVT), Atrial Fibrillation ([name]-Fib), Atrial Flutter ([name]-Flutter), Sustained Ventricular Tachycardia (VT)   Symptoms were noted with PACs and PVCs but not with the short bursts of atrial runs Polysomnography: 32 episodes of apnea observed    Risk Assessment/Calculations:     HYPERTENSION CONTROL Vitals:   03/22/24 1140 03/22/24  BP: (!) 154/83 (!) 146/82    The patient's blood pressure is elevated above target today.  In order to address the patient's elevated BP: [name] current anti-hypertensive medication was adjusted today. (Lopressor  increased to 50 mg twice daily)        Physical Exam:   VS:  BP (!) 146/82   Pulse 85   Ht 5' 3 (1.6 m)   Wt 158 lb (71.7 kg)   LMP 02/27/2014   SpO2 93%   BMI 27.99 kg/m    Wt Readings from Last 3 Encounters:  03/22/24 158 lb (71.7 kg)  02/26/24 156 lb 12.8 oz (71.1 kg)  02/08/24 161 lb 6.4 oz (73.2 kg)    GEN: Well nourished, well groomed.no longer obese.  Somewhat chronically ill-appearing, but no acute distress.  Nontoxic. NECK: No JVD; No carotid bruits CARDIAC: RRR with occasional ectopy, Normal S1, S2; no murmurs, rubs, gallops RESPIRATORY:  Clear to auscultation without rales, wheezing or rhonchi ; nonlabored, good air movement. ABDOMEN: Soft, non-tender, non-distended EXTREMITIES:  No edema; No deformity      ASSESSMENT AND PLAN: .    Problem List Items Addressed This Visit       Cardiology Problems   Coronary artery disease of native artery of native heart with stable angina pectoris - Primary (Chronic)   Noted to have severe RCA disease status post PCI.  Also had diminutive LAD that did not model on coronary CTA, but not amenable to PCI.   She is having nocturnal chest pain and palpitations not linked to exertion.  Possible link to  obstructive sleep apnea or benign arrhythmias. -  Ordered Lexiscan Myoview  stress test - Increase bisoprolol  to 10 mg twice daily. - Added low dose Diltiazem  120 mg at night.  For vasodilation and rate control - Allowed return to cardiac rehabilitation. => Will adjust based on stress test results      Relevant Medications   bisoprolol  (ZEBETA ) 10 MG tablet   diltiazem  (CARDIZEM  CD) 120 MG 24 hr capsule   diltiazem  (CARDIZEM  CD) 120 MG 24 hr capsule   Other Relevant Orders   MYOCARDIAL PERFUSION IMAGING   Cardiac Stress Test: Informed Consent Details: Physician/Practitioner Attestation; Transcribe to consent form and obtain patient signature   Essential hypertension (Chronic)   She has intermittent high blood pressure and low blood pressure.  Today is [name] little bit high.  Adjusted regimen for nocturnal symptoms and potential vasospasm. - Increased bisoprolol  to 10 mg twice daily. - Added low dose diltiazem  120 mg at night.      Relevant Medications   bisoprolol  (ZEBETA ) 10 MG tablet   diltiazem  (CARDIZEM  CD) 120 MG 24 hr capsule   diltiazem  (CARDIZEM  CD) 120 MG 24 hr capsule   Other Relevant Orders   MYOCARDIAL PERFUSION IMAGING   Hyperlipidemia associated with type 2 diabetes mellitus (HCC) (Chronic)   Most recent A1c 5.7 and most recent LDL 20 with total cholesterol 101 on current regimen - Continue combination of Repatha  140 mg every 2 weeks plus Zetia  10 mL daily (statin intolerance precludes use of any statin) - Continue Ozempic and Farxiga       Relevant Medications   bisoprolol  (ZEBETA ) 10 MG tablet   diltiazem  (CARDIZEM  CD) 120 MG 24 hr capsule   diltiazem  (CARDIZEM  CD) 120 MG 24 hr capsule     Other   Atypical chest pain   Nocturnal chest pain and palpitations Intermittent nocturnal symptoms, 2-3 times weekly, lasting 5-10 minutes. Not exertion-related. Possible link to obstructive sleep apnea or benign arrhythmias. Multifactorial.  Could be multiple different things.   Based on the recurrence of symptoms, we need to exclude ischemia.  With known anatomy, we need to exclude inferior ischemia as-she has [name] huge wraparound RCA PDA and [name] relatively diminutive LAD.   She did not do well with nitroglycerin , so I held off on using Imdur , but will add diltiazem  120 mg nightly for antianginal benefit.  - Increased bisoprolol  to 10 mg twice daily. - Added low dose Diltiazem  120 mg at night. - Ordered Lexiscan Myoview       Relevant Orders   Cardiac Stress Test: Informed Consent Details: Physician/Practitioner Attestation; Transcribe to consent form and obtain patient signature   Barrett's esophagus with dysplasia (Chronic)   Despite possible that some of her discomfort could be coming from GERD especially since it seems to be mostly nocturnal. Calcium  channel blocker also help against esophageal spasm. 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 Would like to get 6 months of uninterrupted Plavix  if possible to allow for stent healing. Plan would be Plavix  long-term but okay to interrupt after 6 months. As of June 21, 2024, would be okay to hold Plavix  for procedures or surgeries; Prior to June 21, 2024, for high risk bleeding issues or high risk  procedures, is likely safe at this point 3 months out from PCI to hold Plavix .      Relevant Orders   EKG 12-Lead (Completed)   MYOCARDIAL PERFUSION IMAGING   Severe obstructive sleep apnea (Chronic)   Recent diagnosis with CPAP started. May contribute to nocturnal symptoms via apneic episodes causing heart rate changes. - Continue CPAP therapy. - Monitor for improvement in nocturnal symptoms with CPAP use.      Statin  myopathy (Chronic)   Intolerant to multiple different statins.  She had been on atorvastatin  and then converted to rosuvastatin  with pretty well-controlled lipids but had significant myalgias as well as memory issues. As [name] result she has not been transitioned to Repatha  +10 mg Zetia . Most recent LDL in October was 20.  Excellent control.      Relevant Orders   MYOCARDIAL PERFUSION IMAGING   Type 2 diabetes mellitus without complication, without long-term current use of insulin  (HCC) (Chronic)   She is on Farxiga  5 mg daily, Ozempic with most recent A1c in September 5 0.7.           Informed Consent   Shared Decision Making/Informed Consent The risks [chest pain, shortness of breath, cardiac arrhythmias, dizziness, blood pressure fluctuations, myocardial infarction, stroke/transient ischemic attack, nausea, vomiting, allergic reaction, radiation exposure, metallic taste sensation and life-threatening complications (estimated to be 1 in 10,000)], benefits (risk stratification, diagnosing coronary artery disease, treatment guidance) and alternatives of [name] nuclear stress test were discussed in detail with Ms. Bolender and she agrees to proceed.      Follow-Up: Return in about 2 months (around 05/23/2024) for Follow-up with APP, Alternate 3 months with APP, 6 month with MD.  I spent 69 minutes in the care of Sara Lee today including reviewing labs (2 minutes), reviewing studies (I reviewed studies dating back to her Coronary CTA, cardiac catheterization images and echocardiogram as well as sleep study-9 minutes), face to face time discussing treatment options (27 minutes), reviewing records from 3 notes from Damien Braver, NP, my previous note, hospitalizations/discharge summaries as well as pulmonary critical care note (15 minutes), 16 minutes dictating, and documenting in the encounter.      Signed, Alm MICAEL Clay, MD, MS Alm Clay, M.D., M.S. Interventional Cardiologist  [facility] Pager # [phone]

## 2024-03-22 NOTE — Assessment & Plan Note (Signed)
 She has intermittent high blood pressure and low blood pressure.  Today is a little bit high.  Adjusted regimen for nocturnal symptoms and potential vasospasm. - Increased bisoprolol  to 10 mg twice daily. - Added low dose diltiazem  120 mg at night.

## 2024-03-22 NOTE — Assessment & Plan Note (Signed)
 Most recent A1c 5.7 and most recent LDL 20 with total cholesterol 101 on current regimen - Continue combination of Repatha  140 mg every 2 weeks plus Zetia  10 mL daily (statin intolerance precludes use of any statin) - Continue Ozempic and Farxiga 

## 2024-03-22 NOTE — Assessment & Plan Note (Signed)
 Recent diagnosis with CPAP started. May contribute to nocturnal symptoms via apneic episodes causing heart rate changes. - Continue CPAP therapy. - Monitor for improvement in nocturnal symptoms with CPAP use.

## 2024-03-22 NOTE — Assessment & Plan Note (Signed)
 Noted to have severe RCA disease status post PCI.  Also had diminutive LAD that did not model on coronary CTA, but not amenable to PCI.   She is having nocturnal chest pain and palpitations not linked to exertion.  Possible link to obstructive sleep apnea or benign arrhythmias. - Ordered Lexiscan Myoview  stress test - Increase bisoprolol  to 10 mg twice daily. - Added low dose Diltiazem  120 mg at night.  For vasodilation and rate control - Allowed return to cardiac rehabilitation. => Will adjust based on stress test results

## 2024-03-22 NOTE — Assessment & Plan Note (Addendum)
 Despite possible that some of her discomfort could be coming from GERD especially since it seems to be mostly nocturnal. Calcium  channel blocker also help against esophageal spasm. She had issues with amlodipine  in the past, so we will use diltiazem  120 mg daily

## 2024-03-22 NOTE — Assessment & Plan Note (Signed)
 Extensive PCI to the RCA but with recurrent GI bleed issues as well as uterine bleeding, as such on Plavix  monotherapy. Would like to get 6 months of uninterrupted Plavix  if possible to allow for stent healing. Plan would be Plavix  long-term but okay to interrupt after 6 months. As of June 21, 2024, would be okay to hold Plavix  for procedures or surgeries; Prior to June 21, 2024, for high risk bleeding issues or high risk procedures, is likely safe at this point 3 months out from PCI to hold Plavix .

## 2024-03-22 NOTE — Patient Instructions (Addendum)
 Medication Instructions:   Diltiazem  CD 120 mg at bedtime  Increase bisoprolol   10 mg twice a day  *If you need a refill on your cardiac medications before your next appointment, please call your pharmacy*   Lab Work: Not needed   Testing/Procedures: Your physician has requested that you have a lexiscan myoview . For further information please visit . Please follow instruction sheet, as given.   Your doctor has scheduled you for a Myocardial Perfusion scan  to obtain information about the blood flow to your heart. The test consists of taking pictures of your heart in two phases: while resting and after a stress test.  The stress test may involve walking on a treadmill, or if you are unable to exercise adequately, you will be given a drug intended to have a similar effect on the heart to that of exercise.  The test will take approximately 3 to 4  hours to complete.  How to prepare for your test: Do not eat or drink 2 hours prior to your test Do not consume products containing caffeine  12 hours prior to your test (examples: coffee (regular OR decaf), chocolate, sodas, tea) Your doctor may need you to hold certain medications prior to the test.  If so, these are listed below and should not be taken for 24 hours prior to the test.  If not listed below, you may take your medications as normal.  You may resume taking held medications on your normal schedule once the test is complete.   Meds to hold: none  Do bring a list of your current medications with you.  If you have held any meds in preparation for the test, please bring them, as you may be required to take them once the test is completed. Do wear comfortable clothes and walking shoes.  Do not wear dresses or overalls. Do NOT wear cologne, perfume, aftershave, or fragranced lotions the day of your test (deodorants okay). If these instructions are not followed your test will have to be rescheduled.   A nuclear cardiologist will review your test,  prepare a report and send it to your physician.   If you have questions or concerns about your appointment, you can call the Nuclear Cardiology department at [phone]. If you cannot keep your appointment, please provide 48 hours notification to avoid a possible $50.00 charge to your account.   Please arrive 15 minutes prior to your appointment time for registration and insurance purposes   Follow-Up: At [facility], you and your health needs are our priority.  As part of our continuing mission to provide you with exceptional heart care, we have created designated Provider Care Teams.  These Care Teams include your primary Cardiologist (physician) and Advanced Practice Providers (APPs -  Physician Assistants and Nurse Practitioners) who all work together to provide you with the care you need, when you need it.     Your next appointment:   1  to 2 month(s)  The format for your next appointment:   In Person  Provider:   Damien Braver, NP      Then, Alm Clay, MD will plan to see you again in 6 month(s).   Other Instructions

## 2024-03-23 ENCOUNTER — Encounter (HOSPITAL_COMMUNITY)

## 2024-03-24 ENCOUNTER — Telehealth (HOSPITAL_COMMUNITY): Payer: Self-pay

## 2024-03-24 ENCOUNTER — Telehealth (HOSPITAL_COMMUNITY): Payer: Self-pay | Admitting: *Deleted

## 2024-03-24 NOTE — Telephone Encounter (Signed)
-----   Message from Alm Clay, MD sent at 03/23/2024 12:19 PM EST ----- Regarding: RE: Should be ok ----- Message ----- From: Lennon Fairy BIRCH, RN Sent: 03/23/2024   9:03 AM EST To: Alm LELON Clay, MD  Dr. Clay,   Saw your note re Grace [name] yesterday including the incr bisoprolol , added cardizem  and ordered stress test.  Is the pt okay to return to cardiac rehab or does she need to wait until the stress test is completed?  Kind regards, RODGER Lennon, Cardiac Rehab

## 2024-03-24 NOTE — Telephone Encounter (Signed)
 Left detailed instructions for stress test on home answering machine.

## 2024-03-25 ENCOUNTER — Encounter (HOSPITAL_COMMUNITY): Admission: RE | Admit: 2024-03-25 | Discharge: 2024-03-25 | Attending: Cardiology

## 2024-03-25 DIAGNOSIS — Z955 Presence of coronary angioplasty implant and graft: Secondary | ICD-10-CM | POA: Diagnosis present

## 2024-03-28 ENCOUNTER — Other Ambulatory Visit: Payer: Self-pay | Admitting: Nurse Practitioner

## 2024-03-28 ENCOUNTER — Other Ambulatory Visit: Payer: Self-pay | Admitting: Cardiology

## 2024-03-28 ENCOUNTER — Encounter (HOSPITAL_COMMUNITY)

## 2024-03-28 DIAGNOSIS — I10 Essential (primary) hypertension: Secondary | ICD-10-CM

## 2024-03-28 DIAGNOSIS — I25118 Atherosclerotic heart disease of native coronary artery with other forms of angina pectoris: Secondary | ICD-10-CM

## 2024-03-28 DIAGNOSIS — J449 Chronic obstructive pulmonary disease, unspecified: Secondary | ICD-10-CM

## 2024-03-28 DIAGNOSIS — E785 Hyperlipidemia, unspecified: Secondary | ICD-10-CM

## 2024-03-28 DIAGNOSIS — G720 Drug-induced myopathy: Secondary | ICD-10-CM

## 2024-03-28 DIAGNOSIS — Z955 Presence of coronary angioplasty implant and graft: Secondary | ICD-10-CM

## 2024-03-29 ENCOUNTER — Ambulatory Visit: Payer: Self-pay | Admitting: Nurse Practitioner

## 2024-03-29 ENCOUNTER — Encounter: Payer: Self-pay | Admitting: Nurse Practitioner

## 2024-03-29 ENCOUNTER — Ambulatory Visit: Admitting: Nurse Practitioner

## 2024-03-29 VITALS — BP 122/82 | HR 84 | Temp 97.3°F | Ht 63.0 in | Wt 155.0 lb

## 2024-03-29 DIAGNOSIS — G4733 Obstructive sleep apnea (adult) (pediatric): Secondary | ICD-10-CM

## 2024-03-29 DIAGNOSIS — E038 Other specified hypothyroidism: Secondary | ICD-10-CM | POA: Diagnosis not present

## 2024-03-29 DIAGNOSIS — E119 Type 2 diabetes mellitus without complications: Secondary | ICD-10-CM | POA: Diagnosis not present

## 2024-03-29 DIAGNOSIS — L659 Nonscarring hair loss, unspecified: Secondary | ICD-10-CM

## 2024-03-29 DIAGNOSIS — Z1211 Encounter for screening for malignant neoplasm of colon: Secondary | ICD-10-CM | POA: Diagnosis not present

## 2024-03-29 DIAGNOSIS — Z7985 Long-term (current) use of injectable non-insulin antidiabetic drugs: Secondary | ICD-10-CM | POA: Diagnosis not present

## 2024-03-29 DIAGNOSIS — Z7984 Long term (current) use of oral hypoglycemic drugs: Secondary | ICD-10-CM | POA: Diagnosis not present

## 2024-03-29 LAB — BASIC METABOLIC PANEL WITH GFR
BUN: 11 mg/dL (ref 6–23)
CO2: 26 meq/L (ref 19–32)
Calcium: 9.5 mg/dL (ref 8.4–10.5)
Chloride: 107 meq/L (ref 96–112)
Creatinine, Ser: 0.85 mg/dL (ref 0.40–1.20)
GFR: 70.17 mL/min (ref >60.00)
Glucose, Bld: 125 mg/dL — ABNORMAL HIGH (ref 70–99)
Potassium: 4.2 meq/L (ref 3.5–5.1)
Sodium: 141 meq/L (ref 135–145)

## 2024-03-29 LAB — TSH: TSH: 0.1 u[IU]/mL — ABNORMAL LOW (ref 0.35–5.50)

## 2024-03-29 LAB — T4, FREE: Free T4: 1.4 ng/dL (ref 0.60–1.60)

## 2024-03-29 MED ORDER — SEMAGLUTIDE (2 MG/DOSE) 8 MG/3ML ~~LOC~~ SOPN: 2.0000 mg | PEN_INJECTOR | SUBCUTANEOUS | 1 refills | Status: AC

## 2024-03-29 NOTE — Assessment & Plan Note (Signed)
 She feels self-conscious about hair loss, which may be due to Ozempic , weight loss, or genetics. She has not used any topical treatments before. Refer to dermatology for evaluation and management. Discuss potential use of topical minoxidil or shampoos.

## 2024-03-29 NOTE — Assessment & Plan Note (Signed)
 She experiences difficulty with the CPAP nasal mask and is exploring alternatives.

## 2024-03-29 NOTE — Assessment & Plan Note (Signed)
Increase ozempic to 2 mg weekly 

## 2024-03-29 NOTE — Assessment & Plan Note (Signed)
 Her levothyroxine  dose was recently adjusted to 175 mcg due to over-treatment. Recheck thyroid  levels after dose adjustment.

## 2024-03-29 NOTE — Assessment & Plan Note (Signed)
 Chronic, stable. Her current Ozempic  dose is less effective, with increased appetite noted. She has lost 6 pounds in the past 2 months and aims to lose 20-25 more pounds. Increase Ozempic  to 2 mg weekly, continue farxiga  5mg  daily. Check BMP today. Monitor for constipation and stomach pain. Continue focus on nutrition and exercise.

## 2024-03-29 NOTE — Progress Notes (Signed)
 Established Patient Office Visit  Subjective   Patient ID: Grace [name], female    DOB: [birth year]  Age: 68 y.o. MRN: [id number]  Chief Complaint  Patient presents with   Medication Management    Follow up, no concerns    HPI Discussed the use of AI scribe software for clinical note transcription with the patient, who gave verbal consent to proceed.  History of Present Illness   Grace [name] is a 68 year old female with sleep apnea and hypertension who presents with excessive daytime sleepiness and concerns about hair loss.  For the past few days she has had marked daytime sleepiness, dozing off while sitting and needing about three naps per day. She tried a CPAP but finds the current nasal mask uncomfortable and wants an alternative that fits over her nose without discomfort.  She is bothered by noticeable hair loss and feels self-conscious. She has not used topical treatments and only recently started a shampoo bar marketed for hair growth. Her thyroid  was previously slightly over-treated and her levothyroxine  was reduced to 175 mcg.  She uses Ozempic  1 mg for weight management and diabetes, but feels it is less effective, with increased appetite, though she has lost weight overall. She has been following a special diet, although could adhere slihgtly more.   She takes Farxiga  for diabetes and reports no side effects.       ROS See pertinent positives and negatives per HPI.    Objective:     BP 122/82 (BP Location: Left Arm, Patient Position: Sitting, Cuff Size: Normal)   Pulse 84   Temp (!) 97.3 F (36.3 C)   Ht 5' 3 (1.6 m)   Wt 155 lb (70.3 kg)   LMP 02/27/2014   SpO2 97%   BMI 27.46 kg/m  BP Readings from Last 3 Encounters:  03/29/24 122/82  03/22/24 (!) 146/82  02/26/24 118/74   Wt Readings from Last 3 Encounters:  03/29/24 155 lb (70.3 kg)  03/22/24 158 lb (71.7 kg)  02/26/24 156 lb 12.8 oz (71.1 kg)      Physical Exam Vitals and nursing note  reviewed.  Constitutional:      General: She is not in acute distress.    Appearance: Normal appearance.  HENT:     Head: Normocephalic.  Eyes:     Conjunctiva/sclera: Conjunctivae normal.  Cardiovascular:     Rate and Rhythm: Normal rate and regular rhythm.     Pulses: Normal pulses.     Heart sounds: Normal heart sounds.  Pulmonary:     Effort: Pulmonary effort is normal.     Breath sounds: Normal breath sounds.  Musculoskeletal:     Cervical back: Normal range of motion.  Skin:    General: Skin is warm.     Comments: Hair loss noted to top of scalp  Neurological:     General: No focal deficit present.     Mental Status: She is alert and oriented to person, place, and time.  Psychiatric:        Mood and Affect: Mood normal.        Behavior: Behavior normal.        Thought Content: Thought content normal.        Judgment: Judgment normal.      Assessment & Plan:   Problem List Items Addressed This Visit       Respiratory   Severe obstructive sleep apnea (Chronic)   She experiences difficulty with the CPAP nasal  mask and is exploring alternatives.        Endocrine   Type 2 diabetes mellitus without complication, without long-term current use of insulin  (HCC) - Primary (Chronic)   Chronic, stable. Her current Ozempic  dose is less effective, with increased appetite noted. She has lost 6 pounds in the past 2 months and aims to lose 20-25 more pounds. Increase Ozempic  to 2 mg weekly, continue farxiga  5mg  daily. Check BMP today. Monitor for constipation and stomach pain. Continue focus on nutrition and exercise.       Relevant Medications   Semaglutide , 2 MG/DOSE, 8 MG/3ML SOPN   Other Relevant Orders   Basic metabolic panel with GFR (Completed)   Hypothyroidism   Her levothyroxine  dose was recently adjusted to 175 mcg due to over-treatment. Recheck thyroid  levels after dose adjustment.       Relevant Orders   TSH (Completed)   T4, free (Completed)     Other    Hair loss   She feels self-conscious about hair loss, which may be due to Ozempic , weight loss, or genetics. She has not used any topical treatments before. Refer to dermatology for evaluation and management. Discuss potential use of topical minoxidil or shampoos.      Relevant Orders   Ambulatory referral to Dermatology   Long-term current use of injectable noninsulin antidiabetic medication   Increase ozempic  to 2mg  weekly      Long term current use of oral hypoglycemic drug   Continue farxiga  5mg  daily.       Other Visit Diagnoses       Screen for colon cancer       Cologuard ordered today   Relevant Orders   Cologuard       Return in about 3 months (around 06/27/2024) for Diabetes.    Tinnie DELENA Harada, NP

## 2024-03-29 NOTE — Patient Instructions (Addendum)
 It was great to see you!  We are checking your labs today and will let you know the results via mychart/phone.   Increase your ozempic  to 2mg  weekly   I have placed a referral to dermatology for your hair   You are due for tetanus, you can get this at your pharmacy   Let's follow-up in 3 months, sooner if you have concerns.  If a referral was placed today, you will be contacted for an appointment. Please note that routine referrals can sometimes take up to 3-4 weeks to process. Please call our office if you haven't heard anything after this time frame.  Take care,  Tinnie Harada, NP

## 2024-03-29 NOTE — Assessment & Plan Note (Signed)
 Continue farxiga 5mg  daily.

## 2024-03-29 NOTE — Telephone Encounter (Signed)
 Requesting: ALVOGEN-LEVOTHYROXINE  TAB  Last Visit: 02/22/2024 Next Visit: 03/29/2024 Last Refill: 02/08/2024  Please Advise

## 2024-03-30 ENCOUNTER — Encounter (HOSPITAL_COMMUNITY): Admission: RE | Admit: 2024-03-30 | Discharge: 2024-03-30 | Attending: Cardiology

## 2024-03-30 ENCOUNTER — Telehealth: Payer: Self-pay

## 2024-03-30 DIAGNOSIS — Z955 Presence of coronary angioplasty implant and graft: Secondary | ICD-10-CM | POA: Diagnosis not present

## 2024-03-30 NOTE — Telephone Encounter (Signed)
 Copied from CRM [id number]. Topic: General - Call Back - No Documentation >> Mar 30, 2024  3:50 PM Viola F wrote: Reason for CRM: Patient returned Brittanys phone call again - please reach back out at  [phone]

## 2024-03-31 ENCOUNTER — Ambulatory Visit: Payer: Self-pay | Admitting: Cardiology

## 2024-03-31 ENCOUNTER — Inpatient Hospital Stay (HOSPITAL_COMMUNITY): Admission: RE | Admit: 2024-03-31 | Discharge: 2024-03-31 | Attending: Cardiology | Admitting: Cardiology

## 2024-03-31 DIAGNOSIS — I10 Essential (primary) hypertension: Secondary | ICD-10-CM | POA: Diagnosis present

## 2024-03-31 DIAGNOSIS — E785 Hyperlipidemia, unspecified: Secondary | ICD-10-CM | POA: Insufficient documentation

## 2024-03-31 DIAGNOSIS — T466X5A Adverse effect of antihyperlipidemic and antiarteriosclerotic drugs, initial encounter: Secondary | ICD-10-CM | POA: Diagnosis present

## 2024-03-31 DIAGNOSIS — Z955 Presence of coronary angioplasty implant and graft: Secondary | ICD-10-CM | POA: Insufficient documentation

## 2024-03-31 DIAGNOSIS — J449 Chronic obstructive pulmonary disease, unspecified: Secondary | ICD-10-CM | POA: Insufficient documentation

## 2024-03-31 DIAGNOSIS — I25118 Atherosclerotic heart disease of native coronary artery with other forms of angina pectoris: Secondary | ICD-10-CM | POA: Insufficient documentation

## 2024-03-31 DIAGNOSIS — T466X5D Adverse effect of antihyperlipidemic and antiarteriosclerotic drugs, subsequent encounter: Secondary | ICD-10-CM

## 2024-03-31 DIAGNOSIS — G720 Drug-induced myopathy: Secondary | ICD-10-CM | POA: Insufficient documentation

## 2024-03-31 LAB — MYOCARDIAL PERFUSION IMAGING
LV dias vol: 80 mL (ref 46–106)
LV sys vol: 29 mL (ref 3.8–5.2)
Nuc Stress EF: 64 %
Peak HR: 100 {beats}/min
Rest HR: 74 {beats}/min
Rest Nuclear Isotope Dose: 10.1 mCi
SDS: 0
SRS: 2
SSS: 0
ST Depression (mm): 0 mm
Stress Nuclear Isotope Dose: 32.4 mCi
TID: 0.78

## 2024-03-31 MED ORDER — REGADENOSON 0.4 MG/5ML IV SOLN
INTRAVENOUS | Status: AC
  Filled 2024-03-31: qty 5

## 2024-03-31 MED ORDER — TECHNETIUM TC 99M TETROFOSMIN IV KIT
32.4000 | PACK | Freq: Once | INTRAVENOUS | Status: AC | PRN
  Administered 2024-03-31: 13:00:00 32.4 via INTRAVENOUS

## 2024-03-31 MED ORDER — TECHNETIUM TC 99M TETROFOSMIN IV KIT
10.1000 | PACK | Freq: Once | INTRAVENOUS | Status: AC | PRN
  Administered 2024-03-31: 11:00:00 10.1 via INTRAVENOUS

## 2024-03-31 MED ADMIN — Regadenoson IV Inj 0.4 MG/5ML (0.08 MG/ML): 0.4 mg | INTRAVENOUS | @ 13:00:00 | NDC 71288020185

## 2024-03-31 NOTE — Telephone Encounter (Signed)
 Left message for patient to return call.

## 2024-04-01 ENCOUNTER — Encounter (HOSPITAL_COMMUNITY): Admission: RE | Admit: 2024-04-01 | Discharge: 2024-04-01 | Attending: Cardiology

## 2024-04-01 DIAGNOSIS — Z955 Presence of coronary angioplasty implant and graft: Secondary | ICD-10-CM | POA: Diagnosis not present

## 2024-04-04 ENCOUNTER — Telehealth (HOSPITAL_COMMUNITY): Payer: Self-pay

## 2024-04-04 ENCOUNTER — Encounter (HOSPITAL_COMMUNITY)

## 2024-04-04 DIAGNOSIS — Z955 Presence of coronary angioplasty implant and graft: Secondary | ICD-10-CM

## 2024-04-04 NOTE — Telephone Encounter (Signed)
-----   Message from Alm Clay, MD sent at 04/01/2024  3:41 PM EST ----- She is fine to go back to rehab.  Stress test was nonischemic. ----- Message ----- From: Lennon Fairy BIRCH, RN Sent: 03/23/2024   9:03 AM EST To: Alm LELON Clay, MD  Dr. Clay,   Saw your note re Grace [name] yesterday including the incr bisoprolol , added cardizem  and ordered stress test.  Is the pt okay to return to cardiac rehab or does she need to wait until the stress test is completed?  Kind regards, RODGER Lennon, Cardiac Rehab

## 2024-04-04 NOTE — Telephone Encounter (Signed)
 Left message for patient to return call.

## 2024-04-05 NOTE — Telephone Encounter (Signed)
 Message was delayed from previous call.

## 2024-04-06 ENCOUNTER — Encounter (HOSPITAL_COMMUNITY)

## 2024-04-06 ENCOUNTER — Telehealth (HOSPITAL_COMMUNITY): Payer: Self-pay

## 2024-04-06 ENCOUNTER — Other Ambulatory Visit (HOSPITAL_COMMUNITY): Payer: Self-pay

## 2024-04-06 MED ORDER — DILTIAZEM HCL ER COATED BEADS 180 MG PO CP24
180.0000 mg | ORAL_CAPSULE | Freq: Every day | ORAL | 3 refills | Status: AC
  Filled 2024-04-06 – 2024-04-12 (×2): qty 30, 30d supply, fill #0
  Filled 2024-05-11 – 2024-05-19 (×2): qty 30, 30d supply, fill #1

## 2024-04-06 NOTE — Telephone Encounter (Signed)
" °  Please increase the Cardizem  to 180 mg daily. Dub Huntsman ,MD  04/05/24  1:26 PM "

## 2024-04-06 NOTE — Telephone Encounter (Signed)
 Called patient to inform her of provider's order to increase her cardizem  to 180mg  daily.  Message left on voicemail.

## 2024-04-08 ENCOUNTER — Encounter (HOSPITAL_COMMUNITY)

## 2024-04-10 ENCOUNTER — Other Ambulatory Visit: Payer: Self-pay | Admitting: Nurse Practitioner

## 2024-04-11 ENCOUNTER — Encounter (HOSPITAL_COMMUNITY)
Admission: RE | Admit: 2024-04-11 | Discharge: 2024-04-11 | Disposition: A | Source: Ambulatory Visit | Attending: Cardiology

## 2024-04-11 ENCOUNTER — Telehealth (HOSPITAL_COMMUNITY): Payer: Self-pay

## 2024-04-11 ENCOUNTER — Encounter: Payer: Self-pay | Admitting: Nurse Practitioner

## 2024-04-11 NOTE — Telephone Encounter (Signed)
 Requesting: Farxiga  5 MG Oral Tablet  Last Visit: 03/29/2024 Next Visit: 05/11/2024 Last Refill: 02/08/2024  Please Advise

## 2024-04-11 NOTE — Telephone Encounter (Signed)
 Pt called wanting to cancel her remaining sessions to cardiac rehab because of her pt responsibility for her insurance. Pt stated that she did some research on her insurance and she is unable to pay her portion.

## 2024-04-12 ENCOUNTER — Other Ambulatory Visit (HOSPITAL_COMMUNITY): Payer: Self-pay

## 2024-04-12 ENCOUNTER — Other Ambulatory Visit: Payer: Self-pay

## 2024-04-12 ENCOUNTER — Encounter (HOSPITAL_COMMUNITY): Payer: Self-pay

## 2024-04-12 DIAGNOSIS — Z955 Presence of coronary angioplasty implant and graft: Secondary | ICD-10-CM

## 2024-04-12 NOTE — Progress Notes (Signed)
 Discharge Progress Report  Patient Details  Name: Grace [name] MRN: [id number] Date of Birth: 01-Oct-1955 Referring Provider:   Flowsheet Row INTENSIVE CARDIAC REHAB ORIENT from 02/02/2024 in [facility]  Referring Provider Grace [name] ORN, MD     Number of Visits: 18  Reason for Discharge:  Early Exit:  Insurance  Per Grace [name], pt informed us  that she did her research of her insurance and stated that she is unable to complete her cardiac rehab due to her pt responsibility.  Smoking History:  Tobacco Use History[1]  Diagnosis:  12/23/23 Status post coronary artery stent placement DES RCA  ADL [facility]:   Initial Exercise Prescription:  Initial Exercise Prescription - 02/02/24 1600       Date of Initial Exercise RX and Referring Provider   Date 02/02/24    Referring Provider Grace [name] ORN, MD    Expected Discharge Date 04/29/24      Recumbant Bike   Level 1    RPM 30    Watts 15    Minutes 15    METs 1.7      NuStep   Level 1    SPM 63    Minutes 15    METs 1.7      Prescription Details   Frequency (times per week) 3    Duration Progress to 30 minutes of continuous aerobic without signs/symptoms of physical distress      Intensity   THRR 40-80% of Max Heartrate 61-122    Ratings of Perceived Exertion 11-13    Perceived Dyspnea 0-4      Progression   Progression Continue progressive overload as per policy without signs/symptoms or physical distress.      Resistance Training   Training Prescription Yes    Weight 2 lbs    Reps 10-15          Discharge Exercise Prescription (Final Exercise Prescription Changes):  Exercise Prescription Changes - 03/30/24 1500       Response to Exercise   Blood Pressure (Admit) 132/70    Blood Pressure (Exercise) 138/80    Blood Pressure (Exit) 114/78    Heart Rate (Admit) 88 bpm    Heart Rate (Exercise) 124 bpm    Heart Rate (Exit) 89 bpm     Rating of Perceived Exertion (Exercise) 13    Symptoms None    Comments Reviewed METs and goals    Duration Continue with 30 min of aerobic exercise without signs/symptoms of physical distress.    Intensity THRR unchanged      Progression   Progression Continue to progress workloads to maintain intensity without signs/symptoms of physical distress.    Average METs 2.2      Resistance Training   Weight No wts on wednesdays      Interval Training   Interval Training No      Recumbant Bike   Level 1    RPM 62    Watts 17    Minutes 15    METs 1.9      NuStep   Level 1    SPM 90    Minutes 15    METs 2.4          Functional Capacity:  6 Minute Walk     Row Name 02/02/24 1524         6 Minute Walk   Phase Initial     Distance 1056 feet  Walk Time 6 minutes     # of Rest Breaks 0     MPH 2     METS 1.7     RPE 11     Perceived Dyspnea  2     VO2 Peak 9.28     Symptoms Yes (comment)     Comments Mild shortness of breath     Resting HR 81 bpm     Resting BP 138/86     Resting Oxygen  Saturation  97 %     Exercise Oxygen  Saturation  during 6 min walk 90 %     Max Ex. HR 108 bpm     Max Ex. BP 154/84     2 Minute Post BP 146/88        Psychological, QOL, Others - Outcomes: PHQ 2/9:    02/08/2024    1:16 PM 02/02/2024    1:34 PM 04/17/2022   12:01 PM 11/18/2021    1:33 PM 04/03/2021   11:44 AM  Depression screen PHQ 2/9  Decreased Interest 0 0 0 2 0  Down, Depressed, Hopeless 0 0 1 2 0  PHQ - 2 Score 0 0 1 4 0  Altered sleeping 2 2  2    Tired, decreased energy 2 2  2    Change in appetite 0 0  2   Feeling bad or failure about yourself  0 0  0   Trouble concentrating 0 1  0   Moving slowly or fidgety/restless 0 0  0   Suicidal thoughts 0 0  0   PHQ-9 Score 4  5   10     Difficult doing work/chores Very difficult Not difficult at all  Not difficult at all      Data saved with a previous flowsheet row definition    Quality of Life:  Quality of  Life - 02/02/24 1613       Quality of Life   Select Quality of Life      Quality of Life Scores   Health/Function Pre 11.88 %    Socioeconomic Pre 24 %    Psych/Spiritual Pre 20.75 %    Family Pre 30 %    GLOBAL Pre 17.16 %          Personal Goals: Goals established at orientation with interventions provided to work toward goal.  Personal Goals and Risk Factors at Admission - 02/02/24 1419       Core Components/Risk Factors/Patient Goals on Admission   Lipids Yes    Intervention Provide education and support for participant on nutrition & aerobic/resistive exercise along with prescribed medications to achieve LDL 70mg , HDL >40mg .    Expected Outcomes Short Term: Participant states understanding of desired cholesterol values and is compliant with medications prescribed. Participant is following exercise prescription and nutrition guidelines.;Long Term: Cholesterol controlled with medications as prescribed, with individualized exercise RX and with personalized nutrition plan. Value goals: LDL < 70mg , HDL > 40 mg.           Personal Goals Discharge:  Goals and Risk Factor Review     Row Name 02/16/24 1023 03/09/24 1702           Core Components/Risk Factors/Patient Goals Review   Personal Goals Review Lipids Lipids      Review Grace [name] is doing well with exercise at cardiac rehab. Vital signs and CBG's have been stable. Grace [name] has only had one week of CR sessions since orientation MET levels were obtained and is maintaining these at  this time. Grace [name] is doing well with exercise at cardiac rehab. CBG's have been stable but she has had some tachycardia while at rest at Cardiac Rehab.  Grace [name] sees her cardiologist on 03/22/24 to investigate her recurrent palpitations and possible arrhythmias.      Expected Outcomes Grace [name] will continue to particpate in cardiac rehab for exercise nutriton and lifestyle modifications. Grace [name] will continue to particpate in cardiac rehab for exercise  nutriton and lifestyle modifications.         Exercise Goals and Review:  Exercise Goals     Row Name 02/02/24 1437             Exercise Goals   Increase Physical Activity Yes       Intervention Provide advice, education, support and counseling about physical activity/exercise needs.;Develop an individualized exercise prescription for aerobic and resistive training based on initial evaluation findings, risk stratification, comorbidities and participant's personal goals.       Expected Outcomes Short Term: Attend rehab on a regular basis to increase amount of physical activity.;Long Term: Exercising regularly at least 3-5 days a week.;Long Term: Add in home exercise to make exercise part of routine and to increase amount of physical activity.       Increase Strength and Stamina Yes       Intervention Provide advice, education, support and counseling about physical activity/exercise needs.;Develop an individualized exercise prescription for aerobic and resistive training based on initial evaluation findings, risk stratification, comorbidities and participant's personal goals.       Expected Outcomes Short Term: Increase workloads from initial exercise prescription for resistance, speed, and METs.;Short Term: Perform resistance training exercises routinely during rehab and add in resistance training at home;Long Term: Improve cardiorespiratory fitness, muscular endurance and strength as measured by increased METs and functional capacity ( )       Able to understand and use rate of perceived exertion (RPE) scale Yes       Intervention Provide education and explanation on how to use RPE scale       Expected Outcomes Short Term: Able to use RPE daily in rehab to express subjective intensity level;Long Term:  Able to use RPE to guide intensity level when exercising independently       Knowledge and understanding of Target Heart Rate Range (THRR) Yes       Intervention Provide education and  explanation of THRR including how the numbers were predicted and where they are located for reference       Expected Outcomes Short Term: Able to state/look up THRR;Short Term: Able to use daily as guideline for intensity in rehab;Long Term: Able to use THRR to govern intensity when exercising independently       Able to check pulse independently Yes       Intervention Provide education and demonstration on how to check pulse in carotid and radial arteries.;Review the importance of being able to check your own pulse for safety during independent exercise       Expected Outcomes Short Term: Able to explain why pulse checking is important during independent exercise;Long Term: Able to check pulse independently and accurately       Understanding of Exercise Prescription Yes       Intervention Provide education, explanation, and written materials on patient's individual exercise prescription       Expected Outcomes Short Term: Able to explain program exercise prescription;Long Term: Able to explain home exercise prescription to exercise independently  Exercise Goals Re-Evaluation:  Exercise Goals Re-Evaluation     Row Name 02/10/24 1425 03/30/24 1547           Exercise Goal Re-Evaluation   Exercise Goals Review Increase Physical Activity;Understanding of Exercise Prescription;Increase Strength and Stamina;Knowledge and understanding of Target Heart Rate Range (THRR);Able to understand and use rate of perceived exertion (RPE) scale Increase Physical Activity;Understanding of Exercise Prescription;Increase Strength and Stamina;Knowledge and understanding of Target Heart Rate Range (THRR);Able to understand and use rate of perceived exertion (RPE) scale      Comments Pt's first day in the [facility] program. Pt understands the exercise Rx, THRR, and RPE scale. Reviewed METS and goals. Pt has been on medical hold and has returned to the program. Due to this abseence, pt voices mininmal improvement on  her goals. Pt does reoprt less SOB, which was a goal. Pt is also eating better; which was part of her goal of accepting the lifestyle changes.      Expected Outcomes Will continue to monitor the patient and will progress the exercise workloads as tolerated. Will continue to monitor the patient and will progress the exercise workloads as tolerated.         Nutrition & Weight - Outcomes:  Pre Biometrics - 02/02/24 1313       Pre Biometrics   Waist Circumference 43.5 inches    Hip Circumference 46.5 inches    Waist to Hip Ratio 0.94 %    Triceps Skinfold 21.5 mm    % Body Fat 42 %    Grip Strength 17 kg    Flexibility --   not performed,  chronic back pain   Single Leg Stand 1.97 seconds           Nutrition:   Nutrition Discharge:   Education Questionnaire Score:  Knowledge Questionnaire Score - 02/02/24 1418       Knowledge Questionnaire Score   Pre Score 24/24             [1]  Social History Tobacco Use  Smoking Status Former   Current packs/day: 0.00   Average packs/day: 3.0 packs/day for 50.0 years (150.0 ttl pk-yrs)   Types: Cigarettes   Start date: 09/22/1965   Quit date: 09/23/2015   Years since quitting: 8.5  Smokeless Tobacco Never  Tobacco Comments   Counseling sheet 07-2011

## 2024-04-12 NOTE — Telephone Encounter (Signed)
 Please advise Katie as pt is unable to wear CPAP all night.

## 2024-04-12 NOTE — Telephone Encounter (Signed)
 It does not have to be 4 consecutive hours; just 4 hours in a 24 hr period. Anytime she is sleeping, she should be using, even if it is with naps. Thanks.

## 2024-04-13 ENCOUNTER — Encounter (HOSPITAL_COMMUNITY)

## 2024-04-13 ENCOUNTER — Other Ambulatory Visit: Payer: Self-pay | Admitting: Obstetrics and Gynecology

## 2024-04-15 ENCOUNTER — Encounter (HOSPITAL_COMMUNITY)

## 2024-04-18 ENCOUNTER — Encounter (HOSPITAL_COMMUNITY)

## 2024-04-20 ENCOUNTER — Encounter (HOSPITAL_COMMUNITY)

## 2024-04-20 ENCOUNTER — Other Ambulatory Visit: Payer: Self-pay

## 2024-04-22 ENCOUNTER — Encounter (HOSPITAL_COMMUNITY)

## 2024-04-25 ENCOUNTER — Ambulatory Visit: Admitting: Nurse Practitioner

## 2024-04-25 ENCOUNTER — Encounter: Payer: Self-pay | Admitting: *Deleted

## 2024-04-25 ENCOUNTER — Encounter (HOSPITAL_COMMUNITY)

## 2024-04-26 ENCOUNTER — Ambulatory Visit: Attending: Nurse Practitioner | Admitting: Nurse Practitioner

## 2024-04-26 ENCOUNTER — Encounter: Payer: Self-pay | Admitting: Nurse Practitioner

## 2024-04-26 VITALS — BP 132/86 | HR 83 | Ht 63.0 in | Wt 155.0 lb

## 2024-04-26 DIAGNOSIS — J449 Chronic obstructive pulmonary disease, unspecified: Secondary | ICD-10-CM | POA: Diagnosis not present

## 2024-04-26 DIAGNOSIS — Z8673 Personal history of transient ischemic attack (TIA), and cerebral infarction without residual deficits: Secondary | ICD-10-CM | POA: Diagnosis not present

## 2024-04-26 DIAGNOSIS — R002 Palpitations: Secondary | ICD-10-CM

## 2024-04-26 DIAGNOSIS — I517 Cardiomegaly: Secondary | ICD-10-CM | POA: Diagnosis not present

## 2024-04-26 DIAGNOSIS — I471 Supraventricular tachycardia, unspecified: Secondary | ICD-10-CM | POA: Diagnosis not present

## 2024-04-26 DIAGNOSIS — G4733 Obstructive sleep apnea (adult) (pediatric): Secondary | ICD-10-CM | POA: Diagnosis not present

## 2024-04-26 DIAGNOSIS — I10 Essential (primary) hypertension: Secondary | ICD-10-CM

## 2024-04-26 DIAGNOSIS — E119 Type 2 diabetes mellitus without complications: Secondary | ICD-10-CM | POA: Diagnosis not present

## 2024-04-26 DIAGNOSIS — E785 Hyperlipidemia, unspecified: Secondary | ICD-10-CM | POA: Diagnosis not present

## 2024-04-26 DIAGNOSIS — I2510 Atherosclerotic heart disease of native coronary artery without angina pectoris: Secondary | ICD-10-CM

## 2024-04-26 DIAGNOSIS — D649 Anemia, unspecified: Secondary | ICD-10-CM | POA: Diagnosis not present

## 2024-04-26 DIAGNOSIS — I5189 Other ill-defined heart diseases: Secondary | ICD-10-CM

## 2024-04-26 DIAGNOSIS — Z8719 Personal history of other diseases of the digestive system: Secondary | ICD-10-CM

## 2024-04-26 NOTE — Progress Notes (Signed)
 "  Office Visit    Patient Name: KYMBER KOSAR Date of Encounter: 04/26/2024  Primary Care Provider:  Nedra Tinnie LABOR, NP Primary Cardiologist:  Alm Clay, MD  Chief Complaint    69 year old female with a history of CAD s/p DES-RCA in 12/2023, PSVT, NSVT, hypertension, hyperlipidemia, hypothyroidism, type 2 diabetes, CVA, OSA, COPD, GI bleed, anemia, and GERD who presents for follow-up related to CAD.   Past Medical History    Past Medical History:  Diagnosis Date   Abnormal vaginal bleeding    Anxiety    Arthritis    Asthma    Barrett's esophagus    with high grade dysplasia per endoscopy 05/2005 // followed by Dr. Lupita Commander ([facility])   Blood transfusion without reported diagnosis    Cataract 2019   Not sure, due to inhaler use   Chronic back pain    Complication of anesthesia    per patient she went in respiratory failure in recovery room in 2017   COPD (chronic obstructive pulmonary disease) (HCC)    Diabetes mellitus without complication (HCC)    type 2   Duodenal ulcer disease    thought to be contributed at least partly by overuse of NSAIDs and salicylates (goody powdr)   Emphysema of lung [facility])    Endometrial polyp 03/2003   s/p resection in 03/2003. Path showing submucosal leiomyoma and benign  proliferative type endometrium   Family history of premature CAD 05/19/2018   Fibroid uterus    s/p myomectomy x 2   GERD (gastroesophageal reflux disease) 1990   Hiatal hernia    History of CVA (cerebrovascular accident) 10/2000   History of pineal cyst    11 mm cystic mass noted in the pineal gland per MRI in 2001 - most consitent with simple pineal cyst   HSV infection    Hyperlipidemia    Hypertension    Hypothyroidism    with hx of multinodular goiter (noted on neck US  in 11/2002 - showing diffuse nodularity and inhomogenous texture diffusely BL)   Internal hemorrhoids    grade 2 per colonoscopy in 09/2006 - repeat colonoscopy rec in 5-10 years.    Migraine    Nevus    Personal history of failed conscious sedation 08/11/2011   Sleep apnea    does not use cpap   Stroke (HCC) 10/2000   Tachycardia    Thyroid  disease    Tobacco abuse 04/2012   quit   Past Surgical History:  Procedure Laterality Date   ADENOIDECTOMY     APPENDECTOMY  2017   BALLOON DILATION N/A 06/14/2020   Procedure: BALLOON DILATION;  Surgeon: Wilhelmenia Aloha Raddle., MD;  Location: [facility] ENDOSCOPY;  Service: Gastroenterology;  Laterality: N/A;   BIOPSY  02/15/2020   Procedure: BIOPSY;  Surgeon: Burnette Fallow, MD;  Location: [facility] ENDOSCOPY;  Service: Endoscopy;;   BIOPSY  06/14/2020   Procedure: BIOPSY;  Surgeon: Wilhelmenia Aloha Raddle., MD;  Location: [facility] ENDOSCOPY;  Service: Gastroenterology;;   BIOPSY  03/02/2023   Procedure: BIOPSY;  Surgeon: Saintclair Jasper, MD;  Location: [facility] ENDOSCOPY;  Service: Gastroenterology;;   BRONCHIAL BIOPSY  03/15/2020   Procedure: BRONCHIAL BIOPSIES;  Surgeon: Brenna Adine LITTIE, DO;  Location: [facility] ENDOSCOPY;  Service: Pulmonary;;   BRONCHIAL BRUSHINGS  03/15/2020   Procedure: BRONCHIAL BRUSHINGS;  Surgeon: Brenna Adine LITTIE, DO;  Location: [facility] ENDOSCOPY;  Service: Pulmonary;;   BRONCHIAL NEEDLE ASPIRATION BIOPSY  03/15/2020   Procedure: BRONCHIAL NEEDLE ASPIRATION BIOPSIES;  Surgeon: Brenna Adine LITTIE, DO;  Location: [facility] ENDOSCOPY;  Service: Pulmonary;;   BRONCHIAL WASHINGS  03/15/2020   Procedure: BRONCHIAL WASHINGS;  Surgeon: Brenna Adine CROME, DO;  Location: [facility] ENDOSCOPY;  Service: Pulmonary;;   CHOLECYSTECTOMY     COLONOSCOPY  02/07/2010   COLONOSCOPY WITH PROPOFOL  N/A 02/15/2020   Procedure: COLONOSCOPY WITH PROPOFOL ;  Surgeon: Burnette Fallow, MD;  Location: [facility] ENDOSCOPY;  Service: Endoscopy;  Laterality: N/A;   CORONARY IMAGING/OCT N/A 12/23/2023   Procedure: CORONARY IMAGING/OCT;  Surgeon: Anner Alm ORN, MD;  Location: [facility] INVASIVE CV LAB;  Service: Cardiovascular;  Laterality: N/A;   CORONARY STENT INTERVENTION N/A 12/23/2023   Procedure:  CORONARY STENT INTERVENTION;  Surgeon: Anner Alm ORN, MD;  Location: [facility] INVASIVE CV LAB;  Service: Cardiovascular;  Laterality: N/A;   DILATION AND CURETTAGE OF UTERUS     DILATION AND CURETTAGE OF UTERUS N/A 10/19/2023   Procedure: DILATION AND CURETTAGE;  Surgeon: Jeralyn Crutch, MD;  Location: [facility] OR;  Service: Gynecology;  Laterality: N/A;  ANESTHESIA REQUEST TO BE UNDER IV CONSCIOUS SEDATION LIKE FOR A COLONOSCOPY IF POSSIBLE   ENDOSCOPIC MUCOSAL RESECTION N/A 06/14/2020   Procedure: ENDOSCOPIC MUCOSAL RESECTION;  Surgeon: Wilhelmenia Aloha Raddle., MD;  Location: [facility] ENDOSCOPY;  Service: Gastroenterology;  Laterality: N/A;   ESOPHAGOGASTRODUODENOSCOPY N/A 12/31/2023   Procedure: EGD (ESOPHAGOGASTRODUODENOSCOPY);  Surgeon: Rosalie Kitchens, MD;  Location: [facility] ENDOSCOPY;  Service: Gastroenterology;  Laterality: N/A;   ESOPHAGOGASTRODUODENOSCOPY (EGD) WITH PROPOFOL  N/A 02/15/2020   Procedure: ESOPHAGOGASTRODUODENOSCOPY (EGD) WITH PROPOFOL ;  Surgeon: Burnette Fallow, MD;  Location: [facility] ENDOSCOPY;  Service: Endoscopy;  Laterality: N/A;   ESOPHAGOGASTRODUODENOSCOPY (EGD) WITH PROPOFOL  N/A 06/14/2020   Procedure: ESOPHAGOGASTRODUODENOSCOPY (EGD) WITH PROPOFOL ;  Surgeon: Wilhelmenia Aloha Raddle., MD;  Location: [facility] ENDOSCOPY;  Service: Gastroenterology;  Laterality: N/A;   ESOPHAGOGASTRODUODENOSCOPY (EGD) WITH PROPOFOL  Left 03/02/2023   Procedure: ESOPHAGOGASTRODUODENOSCOPY (EGD) WITH PROPOFOL ;  Surgeon: Saintclair Jasper, MD;  Location: [facility] ENDOSCOPY;  Service: Gastroenterology;  Laterality: Left;   FOREIGN BODY REMOVAL  06/14/2020   Procedure: FOREIGN BODY REMOVAL;  Surgeon: Wilhelmenia Aloha Raddle., MD;  Location: [facility] ENDOSCOPY;  Service: Gastroenterology;;   HYSTEROSCOPY     LAPAROSCOPIC APPENDECTOMY N/A 09/23/2015   Procedure: APPENDECTOMY LAPAROSCOPIC;  Surgeon: Dann Hummer, MD;  Location: [facility] OR;  Service: General;  Laterality: N/A;   LEFT HEART CATH AND CORONARY ANGIOGRAPHY N/A 12/23/2023   Procedure: LEFT HEART  CATH AND CORONARY ANGIOGRAPHY;  Surgeon: Anner Alm ORN, MD;  Location: [facility] INVASIVE CV LAB;  Service: Cardiovascular;  Laterality: N/A;   MYOMECTOMY  1990, 1997   x 2 - In 1997, noted to have extensive pelvic adhesions and BL tubal obstruction   POLYPECTOMY  02/15/2020   Procedure: POLYPECTOMY;  Surgeon: Burnette Fallow, MD;  Location: [facility] ENDOSCOPY;  Service: Endoscopy;;   TONSILLECTOMY     TRANSTHORACIC ECHOCARDIOGRAM  06/2014    EF 55-60%.  No RWMA. Gr 1 DD. Cannot R/o PFO (consider bubble study).   UPPER ESOPHAGEAL ENDOSCOPIC ULTRASOUND (EUS) N/A 06/14/2020   Procedure: UPPER ESOPHAGEAL ENDOSCOPIC ULTRASOUND (EUS);  Surgeon: Wilhelmenia Aloha Raddle., MD;  Location: [facility] ENDOSCOPY;  Service: Gastroenterology;  Laterality: N/A;   UPPER GASTROINTESTINAL ENDOSCOPY  12/18/2009   VIDEO BRONCHOSCOPY WITH ENDOBRONCHIAL NAVIGATION N/A 03/15/2020   Procedure: VIDEO BRONCHOSCOPY WITH ENDOBRONCHIAL NAVIGATION;  Surgeon: Brenna Adine CROME, DO;  Location: [facility] ENDOSCOPY;  Service: Pulmonary;  Laterality: N/A;   VIDEO BRONCHOSCOPY WITH ENDOBRONCHIAL ULTRASOUND  03/15/2020   Procedure: VIDEO BRONCHOSCOPY WITH ENDOBRONCHIAL ULTRASOUND;  Surgeon: Brenna Adine CROME, DO;  Location: [facility] ENDOSCOPY;  Service: Pulmonary;;  WISDOM TOOTH EXTRACTION      Allergies  Allergies[1]   Labs/Other Studies Reviewed    The following studies were reviewed today:  Cardiac Studies & Procedures   ______________________________________________________________________________________________ CARDIAC CATHETERIZATION  CARDIAC CATHETERIZATION 12/23/2023  Conclusion Table formatting from the original result was not included. Images from the original result were not included.    Prox RCA-1 lesion is 50% stenosed. Prox RCA-2 lesion is 80% stenosed.  Mid RCA lesion is 80% stenosed with 40% stenosed side branch in Acute Mrg.   Scoring balloon angioplasty was performed using a BALLOON SCOREFLEX 4.0X10 at both 80% lesions and into the  percent stenosis.   Prox RCA-2 lesion is 80% stenosed.   A drug-eluting stent was successfully placed covering all 3 lesions, using a STENT ONYX FRONTIER 4.0X38 postdilated to 4.2 mm.  Post intervention, there is a 0-15% residual stenosis with TIMI-3 flow maintained however small sidebranch was temporarily occluded and recovered prior to completion of procedure.   Post intervention, there is a 0% residual stenosis.   The left ventricular systolic function is normal. The left ventricular ejection fraction is 55-65% by visual estimate.   LV end diastolic pressure is normal.   There is mild aortic valve stenosis. There is no aortic valve regurgitation.   Continue risk factor GDMT-is on Repatha  for lipids.  Blood pressure grossly well-controlled.   Recommend uninterrupted dual antiplatelet therapy with Aspirin  81mg  daily and Clopidogrel  75mg  daily for a minimum of 6 months (stable ischemic heart disease-Class I recommendation).  Diagnostic: Dominance: Right       Intervention    RECOMMENDATIONS   I  Prox RCA-1 lesion is 50% stenosed. Prox RCA-2 lesion is 80% stenosed.  Mid RCA lesion is 80% stenosed with 40% stenosed side branch in Acute Mrg.   Scoring balloon angioplasty was performed using a BALLOON SCOREFLEX 4.0X10 at both 80% lesions and into the percent stenosis.   Prox RCA-2 lesion is 80% stenosed.   A drug-eluting stent was successfully placed covering all 3 lesions, using a STENT ONYX FRONTIER 4.0X38 postdilated to 4.2 mm.  Post intervention, there is a 0-15% residual stenosis with TIMI-3 flow maintained however small sidebranch was temporarily occluded and recovered prior to completion of procedure.   Post intervention, there is a 0% residual stenosis.   The left ventricular systolic function is normal. The left ventricular ejection fraction is 55-65% by visual estimate.   LV end diastolic pressure is normal.   There is mild aortic valve stenosis. There is no aortic valve regurgitation.    Continue risk factor GDMT-is on Repatha  for lipids.  Blood pressure grossly well-controlled.   Recommend uninterrupted dual antiplatelet therapy with Aspirin  81mg  daily and Clopidogrel  75mg  daily for a minimum of 6 months (stable ischemic heart disease-Class I recommendation).     Alm Clay  Findings Coronary Findings Diagnostic  Dominance: Right  Left Circumflex Vessel is normal in caliber. Vessel is angiographically normal.  First Obtuse Marginal Branch Vessel is small in size.  Second Obtuse Marginal Branch Vessel is small in size.  Left Posterior Atrioventricular Artery Vessel is small in size.  Right Coronary Artery Prox RCA-1 lesion is 50% stenosed. The lesion is eccentric. The lesion is mildly calcified. Prox RCA-2 lesion is 80% stenosed. The lesion is focal and eccentric. Proximal to ectatic segment and bend The lesion is moderately calcified. Mid RCA lesion is 80% stenosed with 40% stenosed side branch in Acute Mrg. The lesion is eccentric. The lesion is moderately calcified. Optical coherence tomography (  OCT) was performed. The dragonfly OCT catheter was advanced and was not able to cross this third lesion.  Even when deployed after stent placement, pullback did not happen.  Acute Marginal Branch Vessel is small in size. Temporary no reflow down this vessel with stent placement across it and postdilation.  With IC NTG, TIMI II flow restored by the end the procedure.  Right Ventricular Branch Vessel is small in size.  Intervention  Prox RCA-1 lesion Stent (Also treats lesions: Prox RCA-2, and Mid RCA with side branch in Acute Mrg) Lesion length:  35 mm. CATH LAUNCHER 6FR AL.75 guide catheter was inserted. Lesion crossed with guidewire using a WIRE ASAHI PROWATER 180CM. A drug-eluting stent was successfully placed using a STENT ONYX FRONTIER 4.0X38. Maximum pressure: 18 atm. Inflation time: 30 sec. Stent strut is well apposed. Post-stent angioplasty was performed  using a BALLOON SCOREFLEX 4.0X10. Maximum pressure:  20 atm. Inflation time:  20 sec. Multiple inflations throughout the stented segment Post-Intervention Lesion Assessment The intervention was successful. Pre-interventional TIMI flow is 2. Post-intervention TIMI flow is 3. Treated lesion length:  38 mm. No complications occurred at this lesion. There is a 0% residual stenosis post intervention.  Prox RCA-2 lesion Stent (Also treats lesions: Prox RCA-1, and Mid RCA with side branch in Acute Mrg) See details in Prox RCA-1 lesion. Post-Intervention Lesion Assessment The intervention was successful. Pre-interventional TIMI flow is 3. Post-intervention TIMI flow is 3. There is a 0% residual stenosis post intervention.  Mid RCA lesion with side branch in Acute Mrg Angioplasty - Main Branch Lesion length:  8 mm. CATH LAUNCHER 6FR AL.75 guide catheter was inserted. WIRE ASAHI PROWATER 180CM guidewire used to cross lesion. Scoring balloon angioplasty was performed using a BALLOON SCOREFLEX 4.0X10. Maximum pressure: 12 atm. Inflation time: 20 sec.  A second ballloon was used, using a standard  BALLOON EMERGE MR 3.5X12. Maximum pressure:  12 atm. Inflation time:  20 sec. All 3 lesions treated together with 1 stent covering the mall. Stent - Main Branch (Also treats lesions: Prox RCA-1, and Prox RCA-2) See details in Prox RCA-1 lesion. Post-Intervention Lesion Assessment The intervention was successful. Pre-interventional TIMI flow is 3. Post-intervention TIMI flow is 3. Treated lesion length:  38 mm. Small sidebranch occluded temporarily. There is a 0% residual stenosis in the main branch post intervention. There is a 0% residual stenosis in the side branch post intervention.   STRESS TESTS  MYOCARDIAL PERFUSION IMAGING 03/31/2024  Interpretation Summary   The study is normal. The study is low risk.   No ST deviation was noted.   LV perfusion is normal.   Left ventricular function is normal. End  diastolic cavity size is normal. End systolic cavity size is normal.   CT images were obtained for attenuation correction and were examined for the presence of coronary calcium  when appropriate.   Coronary calcium  assessment not performed due to prior revascularization.  Normal perfusion.  Low risk study.   ECHOCARDIOGRAM  ECHOCARDIOGRAM COMPLETE 12/30/2023  Narrative ECHOCARDIOGRAM REPORT    Patient Name:   ANNALENA PIATT Date of Exam: 12/30/2023 Medical Rec #:  [id number]       Height:       63.0 in Accession #:    [id number]      Weight:       177.0 lb Date of Birth:  1956/02/07       BSA:          1.836 m Patient Age:  68 years        BP:           131/77 mmHg Patient Gender: F               HR:           89 bpm. Exam Location:  Inpatient  Procedure: 2D Echo (Both Spectral and Color Flow Doppler were utilized during procedure).  Indications:    CP  History:        Patient has prior history of Echocardiogram examinations. Signs/Symptoms:Chest Pain; Risk Factors:Diabetes and Hypertension.  Sonographer:    Norleen Amour Referring Phys: DARRYLE NED O'NEAL  IMPRESSIONS   1. Left ventricular ejection fraction, by estimation, is 55 to 60%. The left ventricle has normal function. The left ventricle has no regional wall motion abnormalities. There is mild concentric left ventricular hypertrophy. Left ventricular diastolic parameters were normal. 2. Right ventricular systolic function is normal. The right ventricular size is normal. There is mildly elevated pulmonary artery systolic pressure. 3. The mitral valve is normal in structure. No evidence of mitral valve regurgitation. No evidence of mitral stenosis. 4. The aortic valve is tricuspid. Aortic valve regurgitation is not visualized. No aortic stenosis is present. 5. The inferior vena cava is normal in size with greater than 50% respiratory variability, suggesting right atrial pressure of 3 mmHg.  FINDINGS Left  Ventricle: Left ventricular ejection fraction, by estimation, is 55 to 60%. The left ventricle has normal function. The left ventricle has no regional wall motion abnormalities. The left ventricular internal cavity size was normal in size. There is mild concentric left ventricular hypertrophy. Left ventricular diastolic parameters were normal. Normal left ventricular filling pressure.  Right Ventricle: The right ventricular size is normal. No increase in right ventricular wall thickness. Right ventricular systolic function is normal. There is mildly elevated pulmonary artery systolic pressure. The tricuspid regurgitant velocity is 3.00 m/s, and with an assumed right atrial pressure of 3 mmHg, the estimated right ventricular systolic pressure is 39.0 mmHg.  Left Atrium: Left atrial size was normal in size.  Right Atrium: Right atrial size was normal in size.  Pericardium: There is no evidence of pericardial effusion.  Mitral Valve: The mitral valve is normal in structure. Mild mitral annular calcification. No evidence of mitral valve regurgitation. No evidence of mitral valve stenosis.  Tricuspid Valve: The tricuspid valve is normal in structure. Tricuspid valve regurgitation is mild . No evidence of tricuspid stenosis.  Aortic Valve: The aortic valve is tricuspid. Aortic valve regurgitation is not visualized. No aortic stenosis is present.  Pulmonic Valve: The pulmonic valve was normal in structure. Pulmonic valve regurgitation is not visualized. No evidence of pulmonic stenosis.  Aorta: The aortic root is normal in size and structure.  Venous: The inferior vena cava is normal in size with greater than 50% respiratory variability, suggesting right atrial pressure of 3 mmHg.  IAS/Shunts: No atrial level shunt detected by color flow Doppler.   LEFT VENTRICLE PLAX 2D LVIDd:         3.90 cm     Diastology LVIDs:         2.90 cm     LV e' medial:    0.09 cm/s LV PW:         1.10 cm     LV  E/e' medial:  8.2 LV IVS:        1.20 cm     LV e' lateral:   0.10 cm/s LVOT diam:  1.70 cm     LV E/e' lateral: 7.0 LV SV:         50 LV SV Index:   27 LVOT Area:     2.27 cm  LV Volumes (MOD) LV vol d, MOD A2C: 59.8 ml LV vol d, MOD A4C: 76.6 ml LV vol s, MOD A2C: 22.1 ml LV vol s, MOD A4C: 36.4 ml LV SV MOD A2C:     37.7 ml LV SV MOD A4C:     76.6 ml LV SV MOD BP:      38.9 ml  RIGHT VENTRICLE             IVC RV Basal diam:  2.20 cm     IVC diam: 1.00 cm RV S prime:     12.80 cm/s TAPSE (M-mode): 2.4 cm  LEFT ATRIUM           Index        RIGHT ATRIUM          Index LA diam:      3.60 cm 1.96 cm/m   RA Area:     8.72 cm LA Vol (A2C): 61.6 ml 33.55 ml/m  RA Volume:   14.90 ml 8.12 ml/m LA Vol (A4C): 30.8 ml 16.78 ml/m AORTIC VALVE LVOT Vmax:   99.80 cm/s LVOT Vmean:  63.300 cm/s LVOT VTI:    0.220 m  AORTA Ao Root diam: 2.80 cm Ao Asc diam:  3.00 cm  MITRAL VALVE                TRICUSPID VALVE MV Area (PHT): 5.34 cm     TR Peak grad:   36.0 mmHg MV Decel Time: 142 msec     TR Vmax:        300.00 cm/s MV E velocity: 0.72 cm/s MV A velocity: 108.00 cm/s  SHUNTS MV E/A ratio:  0.01         Systemic VTI:  0.22 m Systemic Diam: 1.70 cm  Annabella Scarce MD Electronically signed by Annabella Scarce MD Signature Date/Time: 12/30/2023/3:12:00 PM    Final    MONITORS  LONG TERM MONITOR (3-14 DAYS) 01/01/2024  Narrative   13-day Zio patch: Patch Wear Time:  12 days and 9 hours (2025-08-28T19:55:40-0400 to 2025-09-10T05:46:38-0400)   Predominantly sinus rhythm with rate range of 68 to 134 bpm and an average of 91 bpm.   Occasional (1.6%) which are atrial contractions (PACs with rare couplets and triplets.  Also rare (<1%) isolated PVCs.   1 short burst of ~nonsustained ventricular tachycardia with 7 beats at a average rate of 167 bpm   Frequent (1829) atrial burst.  Fastest interval was 5 beats with a max rate of 170 bpm (2.5 seconds), longest was 56 beats  (31.4 seconds) with an average rate of 107 bpm.   No Sustained Arrhythmias: Atrial Tachycardia (AT), Supraventricular Tachycardia (SVT), Atrial Fibrillation (A-Fib), Atrial Flutter (A-Flutter), Sustained Ventricular Tachycardia (VT)   Symptoms were noted with PACs and PVCs but not with the short bursts of atrial runs    Alm Clay, MD   CT SCANS  CT CORONARY MORPH W/CTA COR W/SCORE 08/29/2022  Addendum 09/03/2022 10:57 PM ADDENDUM REPORT: 09/03/2022 22:55  EXAM: OVER-READ INTERPRETATION  CT CHEST  The following report is an over-read performed by radiologist Dr. Leita Sandy [facility] on 09/03/2022. This over-read does not include interpretation of cardiac or coronary anatomy or pathology. The cardiac/coronary CTA interpretation by the cardiologist is attached.  COMPARISON:  09/25/2021.  FINDINGS: Heart is borderline enlarged and there is no pericardial effusion. Three-vessel coronary artery calcifications are noted. There is mild atherosclerotic calcification of the aorta without evidence of aneurysm. The pulmonary trunk is normal in caliber.  No mediastinal lymphadenopathy. The visualized portion of the esophagus is within normal limits.  Mild centrilobular emphysematous changes are present in the lungs. Atelectasis or scarring is noted at the lung bases. No effusion or pneumothorax.  Fatty infiltration of the liver is noted.  No acute abnormality.  Degenerative changes are noted in the thoracic spine. No acute osseous abnormality.  IMPRESSION: 1. Emphysema. 2. Coronary artery calcifications. 3. Aortic atherosclerosis. 4. Hepatic steatosis.   Electronically Signed By: Leita Birmingham M.D. On: 09/03/2022 22:55  Narrative CLINICAL DATA:  69 year old with chest pain  EXAM: Cardiac/Coronary  CTA  TECHNIQUE: The patient was scanned on a [employer].  FINDINGS: A 120 kV prospective scan was triggered in the descending  thoracic aorta at 111 HU's. Axial non-contrast 3 mm slices were carried out through the heart. The data set was analyzed on a dedicated work station and scored using the Agatson method. Gantry rotation speed was 250 msecs and collimation was .6 mm. 0.8 mg of sl NTG was given. The 3D data set was reconstructed in 5% intervals of the 67-82 % of the R-R cycle. Diastolic phases were analyzed on a dedicated work station using MPR, MIP and VRT modes. The patient received 80 cc of contrast.  Image quality: good  Aorta:  Normal size.  No calcifications.  No dissection.  Aortic Valve:  Trileaflet.  No calcifications.  Coronary Arteries:  Normal coronary origin.  Right dominance.  RCA is a large dominant artery that gives rise to PDA and PLA. There is dense proximal/mid mixed plaque 70-99% stenosis. FFR 0.98 to 0.83 mid, and 0.83 to 0.65 mid to distal (abnormal).  Left main is a large artery that gives rise to LAD and LCX arteries.  LAD is a large vessel that has dense mixed plaque proximal/mid with 30-49% stenosis and tapering of mid vessel. No FFR modeling of mid to distal vessel.  D1-small caliber bifurcating vessel with calcified plaque.  LCX is a non-dominant artery. There is diffuse mixed plaque, mild stenosis.  OM1-moderate sized vessel.  No stenosis.  Other findings:  Normal pulmonary vein drainage into the left atrium.  Normal left atrial appendage without a thrombus.  Normal size of the pulmonary artery.  Please see radiology report for non cardiac findings.  IMPRESSION: 1. Coronary calcium  score of 2262. This was 110 percentile for age and sex matched control.  2. Total plaque volume (TPV) 1356 mm3 which is 97 percentile for age-and sex matched controls (calcified plaque 526 mm3; non-calcified plaque 830 mm3). TPV is extensive.  3. Normal coronary origin with right dominance.  4. Severe CAD of mid RCA (FFR abnormal), mild proximal LAD and CX. Possible  significant disease of mid LAD to distal (not modeled by FFR).  Electronically Signed: By: Oneil Parchment M.D. On: 08/29/2022 14:58     ______________________________________________________________________________________________     Recent Labs: 02/08/2024: ALT 46; Hemoglobin 13.6; Platelets 230.0 03/29/2024: BUN 11; Creatinine, Ser 0.85; Potassium 4.2; Sodium 141; TSH 0.10  Recent Lipid Panel    Component Value Date/Time   CHOL 101 02/08/2024 1400   CHOL 64 (L) 12/08/2023 1201   TRIG 103.0 02/08/2024 1400   HDL 60.00 02/08/2024 1400   HDL 36 (L) 12/08/2023 1201   CHOLHDL 2 02/08/2024 1400  VLDL 20.6 02/08/2024 1400   LDLCALC 20 02/08/2024 1400   LDLCALC 12 12/08/2023 1201   LDLCALC 56 09/28/2019 1211   LDLDIRECT 104 (H) 06/12/2010 1641    History of Present Illness    69 year old female with the above past medical history including CAD s/p DES-RCA in 12/2023, PSVT, NSVT, hypertension, hyperlipidemia, hypothyroidism, type 2 diabetes, CVA, OSA, COPD, GI bleed, anemia, and GERD.   She has a history of COPD, former tobacco use, follows with pulmonology.  She has a strong family history of CAD.SABRA  Echocardiogram in 08/2022 showed EF 60 to 65%, normal LV systolic function, mild LVH, G1 DD, no significant valvular abnormalities. Coronary CT angiogram in 08/2022 revealed coronary calcium  score 2262 (99th percentile), extensive TPV, severe CAD of the mid RCA with abnormal FFR, mild proximal LAD and left circumflex stenoses, possible significant disease of the mid LAD to distal LAD (not modeled by FFR).  In the absence of symptoms, initial trial of medical therapy was advised.  Of note she has a history of statin intolerance on Repatha . She was hospitalized in November 2024 in the setting of severe symptomatic anemia, hemoglobin 6.4  Endoscopy showed Barrett's esophagus. She underwent D&C for abnormal uterine bleeding in 10/2023.  At her follow-up visit in 11/2023 she reported daily chest pain,  dyspnea on exertion, palpitations.  Cardiac monitor in 11/2023 revealed predominantly normal sinus rhythm, run run of VT lasting 7 beats, 1829 runs of SVT, longest lasting 31.4 seconds, rare PACs and PVCs.  She underwent outpatient cardiac catheterization on 12/23/2023, which revealed 50 to 80% proximal to mid RCA stenosis s/p DES x 1 covering all 3 lesions.  LV gram showed LVEF 55 to 60%, normal LVEDP.  She was started on DAPT with aspirin  and Plavix .  She was hospitalized in 12/2023 in the setting of upper GI bleed, acute blood loss anemia.  EGD on 12/31/2023 showed significant prepyloric ulcer.  Per Dr. Anner, aspirin  was held indefinitely.  She was evaluated by pulmonology, diagnosed with severe OSA, started on CPAP.  Also noted to have severe COPD.  She was last seen in the office on 03/22/2024 and reported nocturnal chest pain, intermittent palpitations.  Lexiscan  Myoview  in 03/2024 was negative for ischemia.  She was started on diltiazem .  Bisoprolol  was increased to 10 mg twice daily.  She presents today for follow-up.  Since her last visit she has been stable from a cardiac standpoint.  She continues to lose weight intentionally.  Her shortness of breath has improved significantly with weight loss.  She reports rare intermittent nocturnal chest discomfort, lasting seconds to minutes, no associated symptoms, overall decreased in frequency. She denies any recent palpitations, however, her Fitbit will alert her several times a day to an elevated resting heart rate (130s bpm). She plans to join the [employer] and begin exercising regularly in the near future.  She shares that recently she has had intermittent vaginal bleeding.  She plans to follow-up with her PCP regarding this.  Overall, she feels much improved.  Home Medications    Current Outpatient Medications  Medication Sig Dispense Refill   albuterol  (PROAIR  HFA) 108 (90 Base) MCG/ACT inhaler INHALE 2 PUFFS EVERY 6  HOURS AS NEEDED FOR  SHORTNESS OF  BREATH OR  WHEEZING 34 g 3   bisoprolol  (ZEBETA ) 10 MG tablet Take 1 tablet (10 mg total) by mouth in the morning and at bedtime. 180 tablet 3   BREZTRI  AEROSPHERE 160-9-4.8 MCG/ACT AERO inhaler USE 2 INHALATIONS BY MOUTH INTO  THE LUNGS IN THE MORNING AND AT  BEDTIME 32.1 g 3   calcium  carbonate (TUMS - DOSED IN MG ELEMENTAL CALCIUM ) 500 MG chewable tablet Chew 1 tablet by mouth daily as needed for indigestion or heartburn.     clopidogrel  (PLAVIX ) 75 MG tablet Take 1 tablet (75 mg total) by mouth daily with breakfast. 90 tablet 2   Collagen-Vitamin C-Biotin (COLLAGEN PO) Take by mouth daily.     diltiazem  (CARDIZEM  CD) 180 MG 24 hr capsule Take 1 capsule (180 mg total) by mouth daily. 30 capsule 3   diphenhydrAMINE  (BENADRYL ) 25 MG tablet Take 50 mg by mouth at bedtime as needed for sleep.     docusate sodium  (COLACE) 100 MG capsule Take 300 mg by mouth daily.     estradiol  (ESTRACE  VAGINAL) 0.1 MG/GM vaginal cream Place 1 Applicatorful vaginally at bedtime. 42.5 g 12   ezetimibe  (ZETIA ) 10 MG tablet Take 10 mg by mouth daily.     FARXIGA  5 MG TABS tablet TAKE 1 TABLET BY MOUTH DAILY 90 tablet 3   fluticasone  (FLONASE ) 50 MCG/ACT nasal spray USE 2 SPRAYS IN BOTH NOSTRILS  DAILY 16 g 5   furosemide  (LASIX ) 40 MG tablet TAKE 1 TABLET BY MOUTH DAILY AS  NEEDED FOR WEIGHT GAIN OF 3 LB  OVER AT NIGHT OR 5 LB IN 1 WEEK 100 tablet 2   glucose blood (ONETOUCH VERIO) test strip Use to check blood sugar twice daily. Dx: E11.9 300 each 1   Lancets (ONETOUCH DELICA PLUS LANCET33G) MISC USE TO CHECK BLOOD SUGAR  TWICE DAILY 200 each 3   levothyroxine  (SYNTHROID ) 175 MCG tablet TAKE 1 TABLET BY MOUTH DAILY 90 tablet 3   Multiple Vitamins-Minerals (HAIR SKIN & NAILS PO) Take by mouth daily.     nitroGLYCERIN  (NITROSTAT ) 0.4 MG SL tablet Place 0.4 mg under the tongue every 5 (five) minutes as needed for chest pain.     pantoprazole  (PROTONIX ) 40 MG tablet Take 1 tablet (40 mg total) by mouth 2 (two) times daily  before a meal. 180 tablet 2   potassium chloride  (KLOR-CON  M) 10 MEQ tablet Take 2 tablets (20 mEq total) by mouth daily for 3 days, THEN 1 tablet (10 mEq total) daily. 90 tablet 3   pregabalin  (LYRICA ) 50 MG capsule Take 50 mg by mouth daily.     REPATHA  SURECLICK 140 MG/ML SOAJ INJECT 1 PEN SUBCUTANEOUSLY  EVERY 2 WEEKS 6 mL 3   Respiratory Therapy Supplies (NEBULIZER/TUBING/MOUTHPIECE) KIT 1 application by Does not apply route every 6 (six) hours as needed. FAX TO [phone] 1 kit 11   rOPINIRole  (REQUIP ) 0.5 MG tablet Take 1 tablet (0.5 mg total) by mouth at bedtime. 30 tablet 0   Semaglutide , 2 MG/DOSE, 8 MG/3ML SOPN Inject 2 mg as directed once a week. 9 mL 1   traMADol  (ULTRAM ) 50 MG tablet Take 50 mg by mouth every 6 (six) hours as needed for moderate pain (pain score 4-6) or severe pain (pain score 7-10).     traMADol  (ULTRAM ) 50 MG tablet Take 1 tablet (50 mg total) by mouth every 6 (six) hours as needed for chronic back pain 90 tablet 2   valACYclovir  (VALTREX ) 500 MG tablet TAKE 1 TABLET BY MOUTH  DAILY 90 tablet 3   Vibegron  (GEMTESA ) 75 MG TABS TAKE 1 TABLET BY MOUTH DAILY 30 tablet 3   diltiazem  (CARDIZEM  CD) 120 MG 24 hr capsule Take 1 capsule (120 mg total) by mouth daily. (Patient not taking:  Reported on 04/26/2024) 90 capsule 3   ipratropium-albuterol  (DUONEB) 0.5-2.5 (3) MG/3ML SOLN Take 3 mLs by nebulization every 6 (six) hours as needed. (Patient not taking: Reported on 04/26/2024) 360 mL 6   No current facility-administered medications for this visit.     Review of Systems    She denies palpitations, pnd, orthopnea, n, v, dizziness, syncope, edema, weight gain, or early satiety. All other systems reviewed and are otherwise negative except as noted above.   Physical Exam    VS:  BP 132/86 (BP Location: Left Arm, Patient Position: Sitting, Cuff Size: Normal)   Pulse 83   Ht 5' 3 (1.6 m)   Wt 155 lb (70.3 kg)   LMP 02/27/2014   SpO2 97%   BMI 27.46 kg/m   GEN: Well  nourished, well developed, in no acute distress. HEENT: normal. Neck: Supple, no JVD, carotid bruits, or masses. Cardiac: RRR, no murmurs, rubs, or gallops. No clubbing, cyanosis, edema.  Radials/DP/PT 2+ and equal bilaterally.  Respiratory:  Respirations regular and unlabored, clear to auscultation bilaterally. GI: Soft, nontender, nondistended, BS + x 4. MS: no deformity or atrophy. Skin: warm and dry, no rash. Neuro:  Strength and sensation are intact. Psych: Normal affect.  Accessory Clinical Findings    ECG personally reviewed by me today -    - no EKG in office today.    Lab Results  Component Value Date   WBC 5.1 02/08/2024   HGB 13.6 02/08/2024   HCT 43.1 02/08/2024   MCV 93.5 02/08/2024   PLT 230.0 02/08/2024   Lab Results  Component Value Date   CREATININE 0.85 03/29/2024   BUN 11 03/29/2024   NA 141 03/29/2024   K 4.2 03/29/2024   CL 107 03/29/2024   CO2 26 03/29/2024   Lab Results  Component Value Date   ALT 46 (H) 02/08/2024   AST 47 (H) 02/08/2024   ALKPHOS 62 02/08/2024   BILITOT 0.3 02/08/2024   Lab Results  Component Value Date   CHOL 101 02/08/2024   HDL 60.00 02/08/2024   LDLCALC 20 02/08/2024   LDLDIRECT 104 (H) 06/12/2010   TRIG 103.0 02/08/2024   CHOLHDL 2 02/08/2024    Lab Results  Component Value Date   HGBA1C 5.7 (H) 12/30/2023    Assessment & Plan   1. CAD: S/p DES-RCA in 12/2023. LV gram showed LVEF 55 to 60%, normal LVEDP.darted on DAPT.  She was subsequently hospitalized in the setting of acute upper GI bleed.  Aspirin  was held indefinitely. Myoview  in 03/2024 setting of intermittent chest pain, shortness of breath, was negative for ischemia.  She continues to note rare episodes of nocturnal chest discomfort lasting seconds to minutes, no associated symptoms.  Dems occur at rest.  She will go weeks without any symptoms. Should she have progressive symptoms, consider escalation of antianginal therapy. Through shared decision making,  will defer for now.  Continue Plavix , bisoprolol , diltiazem , Zetia , and Repatha .   2.  Palpitations/PSVT/NSVT: Cardiac monitor in 11/2023 revealed predominantly normal sinus rhythm, run run of VT lasting 7 beats, 1829 runs of SVT, longest lasting 31.4 seconds, rare PACs and PVCs.  Most recent echo stable as below, LV gram showed LVEF 50 to 60% on recent cath.  She denies any recent palpitations, however, her Fitbit will alert her several times a day to an elevated resting heart rate (130s bpm).  Continue to monitor HR, symptoms.  Should she continue to have persistently elevated heart rates, consider increasing diltiazem , will  defer for now. Continue bisoprolol , diltiazem .   3. LVH/diastolic dysfunction: Echocardiogram in 07/2022 showed EF 60 to 65%, normal LV systolic function, mild LVH, G1 DD, no significant valvular abnormalities.  She has stable chronic dyspnea on exertion in the setting of COPD, generally euvolemic and well compensated on exam.  Continue bisoprolol , Farxiga , Lasix  as needed.   4. Hypertension: BP well controlled. Continue current antihypertensive regimen.   5. Hyperlipidemia/statin intolerance: LDL was 20 in 01/2024. Continue Repatha  and Zetia .    6. Type 2 diabetes: A1c was 5.7 in 12/2023. Monitored and managed per PCP.    7. History of CVA: No recurrence. Continue Plavix , Repatha .   8. OSA: Severe, managed per pulmonology. Encouraged adherence to CPAP.    9. COPD: Stable intermittent dyspnea. Followed by pulmonology.   10. Anemia/GI bleed: S/p D&C for abnormal uterine bleeding in 10/2023.  Subsequently admitted with upper GI bleed.  Per Dr. Anner, okay to hold aspirin  indefinitely.  Following with GI.  He does report intermittent vaginal bleeding. Hemoglobin was stable at 13.6 in 01/2024.  She has follow-up scheduled with her PCP in 1 week, consider repeat CBC at that time.  11. Disposition: Follow-up in 4 to 6 months with Dr. Anner.      Damien JAYSON Braver, NP 04/26/2024,  9:53 AM       [1]  Allergies Allergen Reactions   Amlodipine      Cramps    Codeine     Headaches    Gabapentin      headache   Prozac [Fluoxetine Hcl] Other (See Comments)    Suicidal ideations   "

## 2024-04-26 NOTE — Patient Instructions (Signed)
 Medication Instructions:  Your physician recommends that you continue on your current medications as directed. Please refer to the Current Medication list given to you today.  *If you need a refill on your cardiac medications before your next appointment, please call your pharmacy*  Lab Work: NONE ordered at this time of appointment   Testing/Procedures: NONE ordered at this time of appointment   Follow-Up: At [facility], you and your health needs are our priority.  As part of our continuing mission to provide you with exceptional heart care, our providers are all part of one team.  This team includes your primary Cardiologist (physician) and Advanced Practice Providers or APPs (Physician Assistants and Nurse Practitioners) who all work together to provide you with the care you need, when you need it.  Your next appointment:   4-6 month(s)  Provider:   Alm Clay, MD    We recommend signing up for the patient portal called MyChart.  Sign up information is provided on this After Visit Summary.  MyChart is used to connect with patients for Virtual Visits (Telemedicine).  Patients are able to view lab/test results, encounter notes, upcoming appointments, etc.  Non-urgent messages can be sent to your provider as well.   To learn more about what you can do with MyChart, go to [email or web address].   Other Instructions

## 2024-04-27 ENCOUNTER — Encounter (HOSPITAL_COMMUNITY)

## 2024-04-29 ENCOUNTER — Encounter (HOSPITAL_COMMUNITY)

## 2024-05-09 ENCOUNTER — Other Ambulatory Visit: Payer: Self-pay | Admitting: Cardiology

## 2024-05-09 ENCOUNTER — Ambulatory Visit

## 2024-05-11 ENCOUNTER — Other Ambulatory Visit: Payer: Self-pay

## 2024-05-11 ENCOUNTER — Ambulatory Visit: Admitting: Nurse Practitioner

## 2024-05-12 ENCOUNTER — Other Ambulatory Visit: Payer: Self-pay

## 2024-05-13 ENCOUNTER — Other Ambulatory Visit: Payer: Self-pay

## 2024-05-17 ENCOUNTER — Other Ambulatory Visit: Payer: Self-pay

## 2024-05-19 ENCOUNTER — Other Ambulatory Visit (HOSPITAL_COMMUNITY): Payer: Self-pay

## 2024-05-23 ENCOUNTER — Ambulatory Visit: Admitting: Nurse Practitioner

## 2024-05-24 ENCOUNTER — Ambulatory Visit: Admitting: Nurse Practitioner

## 2024-05-26 ENCOUNTER — Encounter: Admitting: Pulmonary Disease

## 2024-06-14 ENCOUNTER — Ambulatory Visit

## 2024-06-28 ENCOUNTER — Ambulatory Visit: Admitting: Nurse Practitioner

## 2024-09-27 ENCOUNTER — Ambulatory Visit: Admitting: Cardiology
